# Patient Record
Sex: Male | Born: 1935 | Race: White | Hispanic: No | Smoking: Never smoker
Health system: Southern US, Community
[De-identification: ages and names within clinical notes are randomized; demographics above are authoritative.]

## PROBLEM LIST (undated history)

## (undated) DIAGNOSIS — G5600 Carpal tunnel syndrome, unspecified upper limb: Secondary | ICD-10-CM

## (undated) DIAGNOSIS — I493 Ventricular premature depolarization: Secondary | ICD-10-CM

## (undated) DIAGNOSIS — G609 Hereditary and idiopathic neuropathy, unspecified: Secondary | ICD-10-CM

## (undated) DIAGNOSIS — C61 Malignant neoplasm of prostate: Secondary | ICD-10-CM

## (undated) DIAGNOSIS — D369 Benign neoplasm, unspecified site: Secondary | ICD-10-CM

## (undated) DIAGNOSIS — Z0389 Encounter for observation for other suspected diseases and conditions ruled out: Secondary | ICD-10-CM

## (undated) DIAGNOSIS — I340 Nonrheumatic mitral (valve) insufficiency: Secondary | ICD-10-CM

## (undated) DIAGNOSIS — R269 Unspecified abnormalities of gait and mobility: Secondary | ICD-10-CM

## (undated) DIAGNOSIS — F329 Major depressive disorder, single episode, unspecified: Secondary | ICD-10-CM

## (undated) DIAGNOSIS — I440 Atrioventricular block, first degree: Secondary | ICD-10-CM

## (undated) DIAGNOSIS — E7800 Pure hypercholesterolemia, unspecified: Secondary | ICD-10-CM

## (undated) DIAGNOSIS — Z860100 Personal history of colon polyps, unspecified: Secondary | ICD-10-CM

## (undated) DIAGNOSIS — D696 Thrombocytopenia, unspecified: Secondary | ICD-10-CM

## (undated) DIAGNOSIS — G629 Polyneuropathy, unspecified: Secondary | ICD-10-CM

## (undated) DIAGNOSIS — I341 Nonrheumatic mitral (valve) prolapse: Secondary | ICD-10-CM

## (undated) DIAGNOSIS — F3289 Other specified depressive episodes: Secondary | ICD-10-CM

## (undated) DIAGNOSIS — M1990 Unspecified osteoarthritis, unspecified site: Secondary | ICD-10-CM

## (undated) DIAGNOSIS — J3489 Other specified disorders of nose and nasal sinuses: Secondary | ICD-10-CM

## (undated) DIAGNOSIS — H539 Unspecified visual disturbance: Secondary | ICD-10-CM

## (undated) DIAGNOSIS — I272 Pulmonary hypertension, unspecified: Secondary | ICD-10-CM

## (undated) DIAGNOSIS — IMO0001 Reserved for inherently not codable concepts without codable children: Secondary | ICD-10-CM

## (undated) DIAGNOSIS — I480 Paroxysmal atrial fibrillation: Secondary | ICD-10-CM

## (undated) DIAGNOSIS — C439 Malignant melanoma of skin, unspecified: Secondary | ICD-10-CM

## (undated) DIAGNOSIS — I10 Essential (primary) hypertension: Secondary | ICD-10-CM

## (undated) HISTORY — DX: Thrombocytopenia, unspecified: D69.6

## (undated) HISTORY — DX: Malignant neoplasm of prostate: C61

## (undated) HISTORY — DX: Unspecified abnormalities of gait and mobility: R26.9

## (undated) HISTORY — DX: Personal history of colonic polyps: Z86.010

## (undated) HISTORY — DX: Nonrheumatic mitral (valve) prolapse: I34.1

## (undated) HISTORY — DX: Other specified disorders of nose and nasal sinuses: J34.89

## (undated) HISTORY — DX: Atrioventricular block, first degree: I44.0

## (undated) HISTORY — PX: COLONOSCOPY W/ POLYPECTOMY: SHX1380

## (undated) HISTORY — DX: Major depressive disorder, single episode, unspecified: F32.9

## (undated) HISTORY — PX: US ECHOCARDIOGRAPHY: HXRAD669

## (undated) HISTORY — DX: Benign neoplasm, unspecified site: D36.9

## (undated) HISTORY — DX: Pure hypercholesterolemia, unspecified: E78.00

## (undated) HISTORY — DX: Carpal tunnel syndrome, unspecified upper limb: G56.00

## (undated) HISTORY — DX: Unspecified osteoarthritis, unspecified site: M19.90

## (undated) HISTORY — DX: Essential (primary) hypertension: I10

## (undated) HISTORY — DX: Other specified depressive episodes: F32.89

## (undated) HISTORY — PX: OTHER SURGICAL HISTORY: SHX169

## (undated) HISTORY — DX: Unspecified visual disturbance: H53.9

## (undated) HISTORY — PX: KNEE ARTHROSCOPY: SUR90

## (undated) HISTORY — DX: Hereditary and idiopathic neuropathy, unspecified: G60.9

## (undated) HISTORY — DX: Personal history of colon polyps, unspecified: Z86.0100

## (undated) HISTORY — DX: Nonrheumatic mitral (valve) insufficiency: I34.0

## (undated) HISTORY — DX: Malignant melanoma of skin, unspecified: C43.9

---

## 1991-11-18 HISTORY — PX: PROSTATECTOMY: SHX69

## 1999-12-27 ENCOUNTER — Encounter (INDEPENDENT_AMBULATORY_CARE_PROVIDER_SITE_OTHER): Payer: Self-pay | Admitting: Specialist

## 1999-12-27 ENCOUNTER — Ambulatory Visit (HOSPITAL_BASED_OUTPATIENT_CLINIC_OR_DEPARTMENT_OTHER): Admission: RE | Admit: 1999-12-27 | Discharge: 1999-12-27 | Payer: Self-pay | Admitting: General Surgery

## 2001-07-22 ENCOUNTER — Ambulatory Visit (HOSPITAL_COMMUNITY): Admission: RE | Admit: 2001-07-22 | Discharge: 2001-07-22 | Payer: Self-pay | Admitting: *Deleted

## 2001-11-17 HISTORY — PX: ROTATOR CUFF REPAIR: SHX139

## 2002-02-07 ENCOUNTER — Encounter: Payer: Self-pay | Admitting: Orthopedic Surgery

## 2002-02-07 ENCOUNTER — Encounter: Admission: RE | Admit: 2002-02-07 | Discharge: 2002-02-07 | Payer: Self-pay | Admitting: Orthopedic Surgery

## 2002-02-08 ENCOUNTER — Ambulatory Visit (HOSPITAL_BASED_OUTPATIENT_CLINIC_OR_DEPARTMENT_OTHER): Admission: RE | Admit: 2002-02-08 | Discharge: 2002-02-09 | Payer: Self-pay | Admitting: Orthopedic Surgery

## 2002-02-16 ENCOUNTER — Encounter: Admission: RE | Admit: 2002-02-16 | Discharge: 2002-05-02 | Payer: Self-pay | Admitting: Orthopedic Surgery

## 2003-10-17 ENCOUNTER — Encounter: Admission: RE | Admit: 2003-10-17 | Discharge: 2003-12-19 | Payer: Self-pay | Admitting: *Deleted

## 2003-11-13 ENCOUNTER — Ambulatory Visit (HOSPITAL_COMMUNITY): Admission: RE | Admit: 2003-11-13 | Discharge: 2003-11-13 | Payer: Self-pay | Admitting: Orthopedic Surgery

## 2003-12-01 ENCOUNTER — Ambulatory Visit (HOSPITAL_COMMUNITY): Admission: RE | Admit: 2003-12-01 | Discharge: 2003-12-01 | Payer: Self-pay | Admitting: Orthopedic Surgery

## 2004-01-10 ENCOUNTER — Encounter: Admission: RE | Admit: 2004-01-10 | Discharge: 2004-02-22 | Payer: Self-pay | Admitting: Orthopedic Surgery

## 2004-10-29 ENCOUNTER — Ambulatory Visit (HOSPITAL_BASED_OUTPATIENT_CLINIC_OR_DEPARTMENT_OTHER): Admission: RE | Admit: 2004-10-29 | Discharge: 2004-10-29 | Payer: Self-pay | Admitting: General Surgery

## 2004-10-29 ENCOUNTER — Ambulatory Visit (HOSPITAL_COMMUNITY): Admission: RE | Admit: 2004-10-29 | Discharge: 2004-10-29 | Payer: Self-pay | Admitting: General Surgery

## 2004-10-29 ENCOUNTER — Encounter (INDEPENDENT_AMBULATORY_CARE_PROVIDER_SITE_OTHER): Payer: Self-pay | Admitting: *Deleted

## 2004-11-06 ENCOUNTER — Ambulatory Visit (HOSPITAL_COMMUNITY): Admission: RE | Admit: 2004-11-06 | Discharge: 2004-11-06 | Payer: Self-pay | Admitting: General Surgery

## 2004-11-25 ENCOUNTER — Encounter: Admission: RE | Admit: 2004-11-25 | Discharge: 2004-11-25 | Payer: Self-pay | Admitting: General Surgery

## 2004-11-26 ENCOUNTER — Encounter (INDEPENDENT_AMBULATORY_CARE_PROVIDER_SITE_OTHER): Payer: Self-pay | Admitting: *Deleted

## 2004-11-26 ENCOUNTER — Ambulatory Visit (HOSPITAL_BASED_OUTPATIENT_CLINIC_OR_DEPARTMENT_OTHER): Admission: RE | Admit: 2004-11-26 | Discharge: 2004-11-26 | Payer: Self-pay | Admitting: General Surgery

## 2004-11-26 ENCOUNTER — Ambulatory Visit (HOSPITAL_COMMUNITY): Admission: RE | Admit: 2004-11-26 | Discharge: 2004-11-26 | Payer: Self-pay | Admitting: General Surgery

## 2004-11-30 ENCOUNTER — Emergency Department (HOSPITAL_COMMUNITY): Admission: EM | Admit: 2004-11-30 | Discharge: 2004-11-30 | Payer: Self-pay | Admitting: Emergency Medicine

## 2005-03-06 ENCOUNTER — Ambulatory Visit: Admission: RE | Admit: 2005-03-06 | Discharge: 2005-03-06 | Payer: Self-pay | Admitting: Orthopedic Surgery

## 2005-03-11 ENCOUNTER — Encounter: Admission: RE | Admit: 2005-03-11 | Discharge: 2005-05-27 | Payer: Self-pay | Admitting: Orthopedic Surgery

## 2005-05-26 ENCOUNTER — Encounter: Admission: RE | Admit: 2005-05-26 | Discharge: 2005-05-26 | Payer: Self-pay | Admitting: Dermatology

## 2005-06-17 ENCOUNTER — Encounter: Admission: RE | Admit: 2005-06-17 | Discharge: 2005-08-12 | Payer: Self-pay | Admitting: Orthopaedic Surgery

## 2005-06-18 ENCOUNTER — Ambulatory Visit (HOSPITAL_COMMUNITY): Admission: RE | Admit: 2005-06-18 | Discharge: 2005-06-18 | Payer: Self-pay | Admitting: Orthopedic Surgery

## 2005-06-27 ENCOUNTER — Encounter: Admission: RE | Admit: 2005-06-27 | Discharge: 2005-06-27 | Payer: Self-pay | Admitting: General Surgery

## 2005-07-02 ENCOUNTER — Ambulatory Visit (HOSPITAL_COMMUNITY): Admission: RE | Admit: 2005-07-02 | Discharge: 2005-07-02 | Payer: Self-pay | Admitting: General Surgery

## 2005-07-04 ENCOUNTER — Encounter: Admission: RE | Admit: 2005-07-04 | Discharge: 2005-07-04 | Payer: Self-pay | Admitting: General Surgery

## 2005-07-07 ENCOUNTER — Ambulatory Visit: Payer: Self-pay | Admitting: Oncology

## 2005-07-31 ENCOUNTER — Ambulatory Visit (HOSPITAL_COMMUNITY): Admission: RE | Admit: 2005-07-31 | Discharge: 2005-07-31 | Payer: Self-pay | Admitting: Oncology

## 2005-07-31 ENCOUNTER — Encounter (INDEPENDENT_AMBULATORY_CARE_PROVIDER_SITE_OTHER): Payer: Self-pay | Admitting: *Deleted

## 2005-08-25 ENCOUNTER — Ambulatory Visit: Payer: Self-pay | Admitting: Oncology

## 2005-09-03 ENCOUNTER — Ambulatory Visit (HOSPITAL_COMMUNITY): Admission: RE | Admit: 2005-09-03 | Discharge: 2005-09-03 | Payer: Self-pay | Admitting: Oncology

## 2006-02-02 ENCOUNTER — Ambulatory Visit: Payer: Self-pay | Admitting: Oncology

## 2006-04-22 ENCOUNTER — Ambulatory Visit: Payer: Self-pay | Admitting: Gastroenterology

## 2006-04-23 ENCOUNTER — Ambulatory Visit (HOSPITAL_COMMUNITY): Admission: RE | Admit: 2006-04-23 | Discharge: 2006-04-23 | Payer: Self-pay | Admitting: Gastroenterology

## 2006-05-26 ENCOUNTER — Ambulatory Visit: Payer: Self-pay | Admitting: Gastroenterology

## 2006-05-27 ENCOUNTER — Ambulatory Visit: Payer: Self-pay | Admitting: Oncology

## 2006-06-02 ENCOUNTER — Ambulatory Visit (HOSPITAL_COMMUNITY): Admission: RE | Admit: 2006-06-02 | Discharge: 2006-06-02 | Payer: Self-pay | Admitting: Oncology

## 2006-06-02 LAB — CBC WITH DIFFERENTIAL/PLATELET
Basophils Absolute: 0 10*3/uL (ref 0.0–0.1)
Eosinophils Absolute: 0.1 10*3/uL (ref 0.0–0.5)
HCT: 44.3 % (ref 38.7–49.9)
HGB: 15.3 g/dL (ref 13.0–17.1)
MCH: 31.2 pg (ref 28.0–33.4)
MONO#: 0.4 10*3/uL (ref 0.1–0.9)
NEUT#: 4.9 10*3/uL (ref 1.5–6.5)
NEUT%: 69.7 % (ref 40.0–75.0)
RDW: 13 % (ref 11.2–14.6)
WBC: 7 10*3/uL (ref 4.0–10.0)
lymph#: 1.6 10*3/uL (ref 0.9–3.3)

## 2006-06-02 LAB — COMPREHENSIVE METABOLIC PANEL
AST: 17 U/L (ref 0–37)
Albumin: 4.1 g/dL (ref 3.5–5.2)
BUN: 23 mg/dL (ref 6–23)
CO2: 25 mEq/L (ref 19–32)
Calcium: 9.2 mg/dL (ref 8.4–10.5)
Chloride: 107 mEq/L (ref 96–112)
Creatinine, Ser: 1.03 mg/dL (ref 0.40–1.50)
Glucose, Bld: 116 mg/dL — ABNORMAL HIGH (ref 70–99)
Potassium: 4.5 mEq/L (ref 3.5–5.3)

## 2006-06-02 LAB — LACTATE DEHYDROGENASE: LDH: 153 U/L (ref 94–250)

## 2006-06-04 ENCOUNTER — Ambulatory Visit: Payer: Self-pay | Admitting: Gastroenterology

## 2006-06-16 LAB — HM COLONOSCOPY: HM Colonoscopy: NORMAL

## 2006-09-17 HISTORY — PX: OTHER SURGICAL HISTORY: SHX169

## 2006-09-25 ENCOUNTER — Ambulatory Visit: Payer: Self-pay | Admitting: Oncology

## 2006-09-29 LAB — CBC WITH DIFFERENTIAL/PLATELET
Basophils Absolute: 0 10*3/uL (ref 0.0–0.1)
Eosinophils Absolute: 0 10*3/uL (ref 0.0–0.5)
HGB: 15.3 g/dL (ref 13.0–17.1)
MCV: 90.3 fL (ref 81.6–98.0)
MONO#: 0.4 10*3/uL (ref 0.1–0.9)
MONO%: 5.5 % (ref 0.0–13.0)
NEUT#: 5.2 10*3/uL (ref 1.5–6.5)
RDW: 12.9 % (ref 11.2–14.6)
WBC: 7.3 10*3/uL (ref 4.0–10.0)
lymph#: 1.6 10*3/uL (ref 0.9–3.3)

## 2006-09-29 LAB — COMPREHENSIVE METABOLIC PANEL
Albumin: 4.1 g/dL (ref 3.5–5.2)
BUN: 24 mg/dL — ABNORMAL HIGH (ref 6–23)
CO2: 29 mEq/L (ref 19–32)
Calcium: 9.4 mg/dL (ref 8.4–10.5)
Chloride: 105 mEq/L (ref 96–112)
Glucose, Bld: 94 mg/dL (ref 70–99)
Potassium: 4.7 mEq/L (ref 3.5–5.3)
Sodium: 142 mEq/L (ref 135–145)
Total Protein: 6.4 g/dL (ref 6.0–8.3)

## 2006-10-01 ENCOUNTER — Encounter (INDEPENDENT_AMBULATORY_CARE_PROVIDER_SITE_OTHER): Payer: Self-pay | Admitting: Specialist

## 2006-10-01 ENCOUNTER — Ambulatory Visit (HOSPITAL_COMMUNITY): Admission: RE | Admit: 2006-10-01 | Discharge: 2006-10-01 | Payer: Self-pay | Admitting: Oncology

## 2007-02-02 ENCOUNTER — Encounter: Admission: RE | Admit: 2007-02-02 | Discharge: 2007-02-02 | Payer: Self-pay | Admitting: Orthopedic Surgery

## 2007-02-04 ENCOUNTER — Ambulatory Visit (HOSPITAL_BASED_OUTPATIENT_CLINIC_OR_DEPARTMENT_OTHER): Admission: RE | Admit: 2007-02-04 | Discharge: 2007-02-04 | Payer: Self-pay | Admitting: Orthopedic Surgery

## 2007-03-01 ENCOUNTER — Encounter: Admission: RE | Admit: 2007-03-01 | Discharge: 2007-05-26 | Payer: Self-pay | Admitting: Orthopedic Surgery

## 2007-03-31 ENCOUNTER — Ambulatory Visit: Payer: Self-pay | Admitting: Oncology

## 2007-04-02 LAB — COMPREHENSIVE METABOLIC PANEL
AST: 16 U/L (ref 0–37)
Albumin: 4 g/dL (ref 3.5–5.2)
Alkaline Phosphatase: 35 U/L — ABNORMAL LOW (ref 39–117)
Calcium: 8.9 mg/dL (ref 8.4–10.5)
Chloride: 106 mEq/L (ref 96–112)
Glucose, Bld: 138 mg/dL — ABNORMAL HIGH (ref 70–99)
Potassium: 4.3 mEq/L (ref 3.5–5.3)
Sodium: 140 mEq/L (ref 135–145)
Total Protein: 6.1 g/dL (ref 6.0–8.3)

## 2007-04-02 LAB — CBC WITH DIFFERENTIAL/PLATELET
Basophils Absolute: 0 10*3/uL (ref 0.0–0.1)
EOS%: 0.9 % (ref 0.0–7.0)
Eosinophils Absolute: 0.1 10*3/uL (ref 0.0–0.5)
HGB: 15 g/dL (ref 13.0–17.1)
MCV: 89.6 fL (ref 81.6–98.0)
MONO%: 5.3 % (ref 0.0–13.0)
NEUT#: 4.4 10*3/uL (ref 1.5–6.5)
RBC: 4.75 10*6/uL (ref 4.20–5.71)
RDW: 12.5 % (ref 11.2–14.6)
lymph#: 1.4 10*3/uL (ref 0.9–3.3)

## 2007-09-29 ENCOUNTER — Ambulatory Visit: Payer: Self-pay | Admitting: Oncology

## 2007-10-05 LAB — CBC WITH DIFFERENTIAL/PLATELET
BASO%: 0.1 % (ref 0.0–2.0)
Basophils Absolute: 0 10*3/uL (ref 0.0–0.1)
EOS%: 0.8 % (ref 0.0–7.0)
HCT: 43.9 % (ref 38.7–49.9)
HGB: 15.4 g/dL (ref 13.0–17.1)
MCH: 31.6 pg (ref 28.0–33.4)
MCHC: 35 g/dL (ref 32.0–35.9)
MCV: 90.1 fL (ref 81.6–98.0)
MONO%: 6.5 % (ref 0.0–13.0)
NEUT%: 63 % (ref 40.0–75.0)
lymph#: 2.2 10*3/uL (ref 0.9–3.3)

## 2007-10-05 LAB — COMPREHENSIVE METABOLIC PANEL
ALT: 15 U/L (ref 0–53)
AST: 16 U/L (ref 0–37)
Alkaline Phosphatase: 36 U/L — ABNORMAL LOW (ref 39–117)
BUN: 26 mg/dL — ABNORMAL HIGH (ref 6–23)
Creatinine, Ser: 1 mg/dL (ref 0.40–1.50)
Total Bilirubin: 0.7 mg/dL (ref 0.3–1.2)

## 2008-04-03 ENCOUNTER — Ambulatory Visit (HOSPITAL_COMMUNITY): Admission: RE | Admit: 2008-04-03 | Discharge: 2008-04-03 | Payer: Self-pay | Admitting: Oncology

## 2008-04-03 ENCOUNTER — Ambulatory Visit: Payer: Self-pay | Admitting: Oncology

## 2008-06-09 ENCOUNTER — Ambulatory Visit: Payer: Self-pay | Admitting: *Deleted

## 2008-06-09 DIAGNOSIS — F3289 Other specified depressive episodes: Secondary | ICD-10-CM | POA: Insufficient documentation

## 2008-06-09 DIAGNOSIS — M5380 Other specified dorsopathies, site unspecified: Secondary | ICD-10-CM | POA: Insufficient documentation

## 2008-06-09 DIAGNOSIS — M159 Polyosteoarthritis, unspecified: Secondary | ICD-10-CM

## 2008-06-09 DIAGNOSIS — K219 Gastro-esophageal reflux disease without esophagitis: Secondary | ICD-10-CM

## 2008-06-09 DIAGNOSIS — E7800 Pure hypercholesterolemia, unspecified: Secondary | ICD-10-CM

## 2008-06-09 DIAGNOSIS — Z8679 Personal history of other diseases of the circulatory system: Secondary | ICD-10-CM | POA: Insufficient documentation

## 2008-06-09 DIAGNOSIS — F329 Major depressive disorder, single episode, unspecified: Secondary | ICD-10-CM

## 2008-06-09 DIAGNOSIS — Z860100 Personal history of colon polyps, unspecified: Secondary | ICD-10-CM | POA: Insufficient documentation

## 2008-06-09 DIAGNOSIS — I10 Essential (primary) hypertension: Secondary | ICD-10-CM

## 2008-06-09 DIAGNOSIS — Z85820 Personal history of malignant melanoma of skin: Secondary | ICD-10-CM

## 2008-06-09 DIAGNOSIS — I059 Rheumatic mitral valve disease, unspecified: Secondary | ICD-10-CM | POA: Insufficient documentation

## 2008-06-09 DIAGNOSIS — R011 Cardiac murmur, unspecified: Secondary | ICD-10-CM | POA: Insufficient documentation

## 2008-06-09 DIAGNOSIS — Z8546 Personal history of malignant neoplasm of prostate: Secondary | ICD-10-CM

## 2008-08-15 ENCOUNTER — Ambulatory Visit: Payer: Self-pay | Admitting: *Deleted

## 2008-10-04 ENCOUNTER — Ambulatory Visit: Payer: Self-pay | Admitting: Oncology

## 2008-10-06 LAB — CBC WITH DIFFERENTIAL/PLATELET
BASO%: 0.5 % (ref 0.0–2.0)
EOS%: 0.7 % (ref 0.0–7.0)
LYMPH%: 26.2 % (ref 14.0–48.0)
MCH: 31.3 pg (ref 28.0–33.4)
MCHC: 34.1 g/dL (ref 32.0–35.9)
MONO#: 0.4 10*3/uL (ref 0.1–0.9)
Platelets: 108 10*3/uL — ABNORMAL LOW (ref 145–400)
RBC: 4.87 10*6/uL (ref 4.20–5.71)
WBC: 6.1 10*3/uL (ref 4.0–10.0)
lymph#: 1.6 10*3/uL (ref 0.9–3.3)

## 2008-10-06 LAB — COMPREHENSIVE METABOLIC PANEL
AST: 17 U/L (ref 0–37)
BUN: 27 mg/dL — ABNORMAL HIGH (ref 6–23)
Calcium: 9.4 mg/dL (ref 8.4–10.5)
Chloride: 105 mEq/L (ref 96–112)
Creatinine, Ser: 1.02 mg/dL (ref 0.40–1.50)
Glucose, Bld: 96 mg/dL (ref 70–99)

## 2009-03-29 ENCOUNTER — Ambulatory Visit (HOSPITAL_COMMUNITY): Admission: RE | Admit: 2009-03-29 | Discharge: 2009-03-29 | Payer: Self-pay | Admitting: Oncology

## 2009-04-02 ENCOUNTER — Ambulatory Visit: Payer: Self-pay | Admitting: Oncology

## 2009-04-04 LAB — COMPREHENSIVE METABOLIC PANEL
ALT: 19 U/L (ref 0–53)
Albumin: 4 g/dL (ref 3.5–5.2)
CO2: 25 mEq/L (ref 19–32)
Glucose, Bld: 98 mg/dL (ref 70–99)
Potassium: 4.7 mEq/L (ref 3.5–5.3)
Sodium: 139 mEq/L (ref 135–145)
Total Protein: 6.2 g/dL (ref 6.0–8.3)

## 2009-04-04 LAB — CBC WITH DIFFERENTIAL/PLATELET
Eosinophils Absolute: 0 10*3/uL (ref 0.0–0.5)
MONO#: 0.5 10*3/uL (ref 0.1–0.9)
NEUT#: 4.1 10*3/uL (ref 1.5–6.5)
RBC: 4.66 10*6/uL (ref 4.20–5.82)
RDW: 12.5 % (ref 11.0–14.6)
WBC: 6.2 10*3/uL (ref 4.0–10.3)
lymph#: 1.5 10*3/uL (ref 0.9–3.3)

## 2009-04-04 LAB — LACTATE DEHYDROGENASE: LDH: 149 U/L (ref 94–250)

## 2009-04-10 ENCOUNTER — Ambulatory Visit: Payer: Self-pay | Admitting: Family Medicine

## 2009-04-10 DIAGNOSIS — L309 Dermatitis, unspecified: Secondary | ICD-10-CM | POA: Insufficient documentation

## 2009-04-10 DIAGNOSIS — L2089 Other atopic dermatitis: Secondary | ICD-10-CM

## 2009-04-12 ENCOUNTER — Ambulatory Visit (HOSPITAL_BASED_OUTPATIENT_CLINIC_OR_DEPARTMENT_OTHER): Admission: RE | Admit: 2009-04-12 | Discharge: 2009-04-12 | Payer: Self-pay | Admitting: Orthopedic Surgery

## 2009-04-24 ENCOUNTER — Encounter: Admission: RE | Admit: 2009-04-24 | Discharge: 2009-05-14 | Payer: Self-pay | Admitting: Orthopedic Surgery

## 2009-05-28 ENCOUNTER — Ambulatory Visit: Payer: Self-pay | Admitting: Family Medicine

## 2009-05-28 DIAGNOSIS — B353 Tinea pedis: Secondary | ICD-10-CM

## 2009-09-17 HISTORY — PX: INGUINAL HERNIA REPAIR: SHX194

## 2009-09-18 ENCOUNTER — Ambulatory Visit (HOSPITAL_BASED_OUTPATIENT_CLINIC_OR_DEPARTMENT_OTHER): Admission: RE | Admit: 2009-09-18 | Discharge: 2009-09-18 | Payer: Self-pay | Admitting: Surgery

## 2009-10-02 ENCOUNTER — Ambulatory Visit: Payer: Self-pay | Admitting: Oncology

## 2009-10-04 LAB — COMPREHENSIVE METABOLIC PANEL
Albumin: 4.2 g/dL (ref 3.5–5.2)
Alkaline Phosphatase: 34 U/L — ABNORMAL LOW (ref 39–117)
BUN: 24 mg/dL — ABNORMAL HIGH (ref 6–23)
CO2: 29 mEq/L (ref 19–32)
Calcium: 9.5 mg/dL (ref 8.4–10.5)
Glucose, Bld: 72 mg/dL (ref 70–99)
Potassium: 4.4 mEq/L (ref 3.5–5.3)

## 2009-10-04 LAB — CBC WITH DIFFERENTIAL/PLATELET
BASO%: 0.8 % (ref 0.0–2.0)
Eosinophils Absolute: 0 10*3/uL (ref 0.0–0.5)
LYMPH%: 25.1 % (ref 14.0–49.0)
MCHC: 34.8 g/dL (ref 32.0–36.0)
MONO#: 0.5 10*3/uL (ref 0.1–0.9)
NEUT#: 3.8 10*3/uL (ref 1.5–6.5)
Platelets: 110 10*3/uL — ABNORMAL LOW (ref 140–400)
RBC: 4.49 10*6/uL (ref 4.20–5.82)
WBC: 5.8 10*3/uL (ref 4.0–10.3)
lymph#: 1.5 10*3/uL (ref 0.9–3.3)

## 2009-10-04 LAB — LACTATE DEHYDROGENASE: LDH: 152 U/L (ref 94–250)

## 2010-09-17 ENCOUNTER — Ambulatory Visit: Payer: Self-pay | Admitting: Internal Medicine

## 2010-09-17 ENCOUNTER — Ambulatory Visit (HOSPITAL_BASED_OUTPATIENT_CLINIC_OR_DEPARTMENT_OTHER): Admission: RE | Admit: 2010-09-17 | Discharge: 2010-09-17 | Payer: Self-pay | Admitting: Internal Medicine

## 2010-09-17 ENCOUNTER — Ambulatory Visit: Payer: Self-pay | Admitting: Interventional Radiology

## 2010-09-17 DIAGNOSIS — L84 Corns and callosities: Secondary | ICD-10-CM

## 2010-09-17 DIAGNOSIS — C61 Malignant neoplasm of prostate: Secondary | ICD-10-CM

## 2010-09-17 DIAGNOSIS — M549 Dorsalgia, unspecified: Secondary | ICD-10-CM

## 2010-09-17 LAB — CONVERTED CEMR LAB
BUN: 27 mg/dL — ABNORMAL HIGH (ref 6–23)
Calcium: 9.8 mg/dL (ref 8.4–10.5)
Glucose, Bld: 93 mg/dL (ref 70–99)
Hemoglobin: 15.3 g/dL (ref 13.0–17.0)
MCHC: 34.2 g/dL (ref 30.0–36.0)
MCV: 91.2 fL (ref 78.0–100.0)
PSA: 0.07 ng/mL — ABNORMAL LOW (ref 0.10–4.00)
RBC: 4.91 M/uL (ref 4.22–5.81)

## 2010-09-19 ENCOUNTER — Telehealth: Payer: Self-pay | Admitting: Internal Medicine

## 2010-09-19 DIAGNOSIS — D696 Thrombocytopenia, unspecified: Secondary | ICD-10-CM | POA: Insufficient documentation

## 2010-09-26 ENCOUNTER — Ambulatory Visit: Payer: Self-pay | Admitting: Oncology

## 2010-09-30 ENCOUNTER — Ambulatory Visit (HOSPITAL_COMMUNITY): Admission: RE | Admit: 2010-09-30 | Discharge: 2010-09-30 | Payer: Self-pay | Admitting: Oncology

## 2010-09-30 LAB — COMPREHENSIVE METABOLIC PANEL
AST: 18 U/L (ref 0–37)
Albumin: 4.1 g/dL (ref 3.5–5.2)
Alkaline Phosphatase: 33 U/L — ABNORMAL LOW (ref 39–117)
BUN: 22 mg/dL (ref 6–23)
Potassium: 4.3 mEq/L (ref 3.5–5.3)
Sodium: 140 mEq/L (ref 135–145)

## 2010-09-30 LAB — CBC WITH DIFFERENTIAL/PLATELET
BASO%: 0.6 % (ref 0.0–2.0)
EOS%: 0.7 % (ref 0.0–7.0)
MCH: 32.1 pg (ref 27.2–33.4)
MCHC: 35.1 g/dL (ref 32.0–36.0)
RBC: 4.69 10*6/uL (ref 4.20–5.82)
RDW: 12.7 % (ref 11.0–14.6)
lymph#: 1.7 10*3/uL (ref 0.9–3.3)

## 2010-10-01 ENCOUNTER — Ambulatory Visit: Payer: Self-pay | Admitting: Cardiology

## 2010-10-08 ENCOUNTER — Encounter: Payer: Self-pay | Admitting: Internal Medicine

## 2010-11-19 ENCOUNTER — Ambulatory Visit: Admit: 2010-11-19 | Payer: Self-pay | Admitting: Internal Medicine

## 2010-12-19 NOTE — Progress Notes (Signed)
Summary: Lab Results  Phone Note Outgoing Call   Summary of Call: call pt - platelet count is slightly low.  this may be caused by medication side effect.   I recommend we repeat CBC in 6 months Initial call taken by: D. Thomos Lemons DO,  September 19, 2010 1:16 PM  Follow-up for Phone Call        call placed to patient at [phone], no answer. A voice message was left for patient to return call Follow-up by: Glendell Docker CMA,  September 19, 2010 3:20 PM  Additional Follow-up for Phone Call Additional follow up Details #1::        patient returned call , he has been advised per Dr Artist Pais instructions Additional Follow-up by: Glendell Docker CMA,  September 20, 2010 10:11 AM  New Problems: THROMBOCYTOPENIA (ICD-287.5)   New Problems: THROMBOCYTOPENIA (ICD-287.5)

## 2010-12-19 NOTE — Assessment & Plan Note (Signed)
Summary: pain in lower back/mhf   Vital Signs:  Patient profile:   77 year old male Height:      77 inches Weight:      204 pounds BMI:     24.28 O2 Sat:      97 % on Room air Temp:     97.5 degrees F oral Pulse rate:   61 / minute Pulse rhythm:   regular Resp:     18 per minute BP sitting:   122 / 80  (right arm) Cuff size:   large  Vitals Entered By: Glendell Docker CMA (September 17, 2010 3:22 PM)  O2 Flow:  Room air CC: Back Pain Is Patient Diabetic? No Pain Assessment Patient in pain? yes     Location: lower back Intensity: 6 Type: sharp Onset of pain  Intermittent Comments c/o sudden onset  lower back pain for the past week, relief with heating pad,  evaluation of callus on toe of left foot,  denies any urinary symptoms, request flu shot   Primary Care Provider:  Seymour Bars DO  CC:  Back Pain.  History of Present Illness: 77 y/o white male c/o low back pain onset 1 week chronic low back pain lower lumbar  sharp intermittent pain with movement severity 6 out of 10 no lower ext weakness no radicular symptoms no abd or GU symptoms  hx of prostate - Dr. Serena Colonel last PSA - low  hx of  melanoma x 3    Allergies (verified): No Known Drug Allergies  Past History:  Past Medical History: Current Problems:  HYPERTENSION, BENIGN ESSENTIAL (ICD-401.1) HYPERCHOLESTEROLEMIA (ICD-272.0) CARDIAC MURMUR (ICD-785.2) GERD (ICD-530.81)  DEPRESSION (ICD-311) OSTEOARTHRITIS, GENERALIZED, MULTIPLE JOINTS (ICD-715.09) MITRAL VALVE PROLAPSE (ICD-424.0) ARRHYTHMIA, HX OF (ICD-V12.50) PERSONAL HISTORY OF COLONIC POLYPS (ICD-V12.72) PERSONAL HISTORY OF MALIGNANT MELANOMA OF SKIN    PERSONAL HISTORY MALIGNANT NEOPLASM PROSTATE (ICD-V10.46)  Family History: Family History of Arthritis - mother Family History Hypertension - mother and father Family History Psychiatric care - father Family History of Stroke - mother   Social History: Retired -  Airline pilot Widower 2 children    Review of Systems  The patient denies fever and abdominal pain.    Physical Exam  General:  alert, well-developed, and well-nourished.   Lungs:  normal respiratory effort and normal breath sounds.   Heart:  normal rate, regular rhythm, and no gallop.   Msk:  2nd digit of left foot  - hammer toe deformity hard callus tip of toe,  no redness or tenderness  limited lumbar flexion.  some pain with lumbar extension and side bending Extremities:  trace left pedal edema and trace right pedal edema.   Neurologic:  cranial nerves II-XII intact, strength normal in all extremities, gait normal, and DTRs symmetrical and normal.   Psych:  normally interactive and good eye contact.     Impression & Recommendations:  Problem # 1:  BACK PAIN, CHRONIC (ICD-724.5) I suspect pain from chronic OA of lumbar spine.  he has hx of prostate ca.  check x ray of lumbar spine and psa trial of muscle relaxers reviewed stretching exercises for hamstrings Patient advised to call office if symptoms persist or worsen.   The following medications were removed from the medication list:    Voltaren 75 Mg Tbec (Diclofenac sodium) .Marland Kitchen... 1 by mouth two times a day His updated medication list for this problem includes:    Bayer Aspirin Ec Low Dose 81 Mg Tbec (Aspirin) .Marland Kitchen... Take 1 tablet by  mouth once a day    Tylenol Extra Strength 500 Mg Tabs (Acetaminophen) .Marland Kitchen... As needed    Celebrex 200 Mg Caps (Celecoxib) .Marland Kitchen... Take 1 capsule by mouth once daily    Cyclobenzaprine Hcl 5 Mg Tabs (Cyclobenzaprine hcl) ..... One by mouth three times a day as needed  Orders: T-Lumbar Spine 2 Views (72100TC)  Problem # 2:  CALLUS, LEFT FOOT (ICD-700) callus on tip of left second digit.  he has chronic hammar toe deformity. arrange podiatry consult  use extra padding.   he may benefit from shoe inserts  Orders: Podiatry Referral (Podiatry)  Problem # 3:  ADENOCARCINOMA, PROSTATE  (ICD-185) monitor PSA .  forward copy to his urologist  Orders: T-PSA ([id number])  Complete Medication List: 1)  Toprol Xl 100 Mg Xr24h-tab (Metoprolol succinate) .... Take 1 tablet by mouth once a day 2)  Lipitor 10 Mg Tabs (Atorvastatin calcium) .... Take 1 tablet by mouth once a day 3)  Prilosec 20 Mg Cpdr (Omeprazole) .... As needed 4)  Trazodone Hcl 300 Mg Tabs (Trazodone hcl) .... Take 1 tablet by mouth once a day 5)  Bayer Aspirin Ec Low Dose 81 Mg Tbec (Aspirin) .... Take 1 tablet by mouth once a day 6)  Tylenol Extra Strength 500 Mg Tabs (Acetaminophen) .... As needed 7)  Celebrex 200 Mg Caps (Celecoxib) .... Take 1 capsule by mouth once daily 8)  Cyclobenzaprine Hcl 5 Mg Tabs (Cyclobenzaprine hcl) .... One by mouth three times a day as needed  Other Orders: T-Basic Metabolic Panel [id number]) T-CBC No Diff ([id number])  Patient Instructions: 1)  Please schedule a follow-up appointment in 2 months. Prescriptions: CYCLOBENZAPRINE HCL 5 MG TABS (CYCLOBENZAPRINE HCL) one by mouth three times a day as needed  #30 x 0   Entered and Authorized by:   D. Thomos Lemons DO   Signed by:   D. Thomos Lemons DO on 09/17/2010   Method used:   Print then Give to Patient   RxID:   [id number]    Orders Added: 1)  T-Lumbar Spine 2 Views [72100TC] 2)  T-PSA [[id number]] 3)  T-Basic Metabolic Panel [[id number]] 4)  T-CBC No Diff [[id number]] 5)  Podiatry Referral [Podiatry] 6)  Est. Patient Level IV [59563]    Current Allergies (reviewed today): No known allergies

## 2010-12-19 NOTE — Consult Note (Signed)
Summary: [facility]  [facility]   Imported By: Sherian Rein 10/28/2010 15:15:02  _____________________________________________________________________  External Attachment:    Type:   Image     Comment:   External Document

## 2011-02-20 LAB — BASIC METABOLIC PANEL
CO2: 29 mEq/L (ref 19–32)
Calcium: 8.8 mg/dL (ref 8.4–10.5)
Glucose, Bld: 136 mg/dL — ABNORMAL HIGH (ref 70–99)
Sodium: 136 mEq/L (ref 135–145)

## 2011-02-25 LAB — BASIC METABOLIC PANEL
BUN: 24 mg/dL — ABNORMAL HIGH (ref 6–23)
CO2: 28 mEq/L (ref 19–32)
Chloride: 103 mEq/L (ref 96–112)
GFR calc non Af Amer: >60 mL/min (ref >60)
Glucose, Bld: 85 mg/dL (ref 70–99)
Potassium: 5.2 mEq/L — ABNORMAL HIGH (ref 3.5–5.1)

## 2011-03-17 ENCOUNTER — Other Ambulatory Visit: Payer: Self-pay | Admitting: *Deleted

## 2011-03-17 DIAGNOSIS — E7800 Pure hypercholesterolemia, unspecified: Secondary | ICD-10-CM

## 2011-03-17 DIAGNOSIS — Z79899 Other long term (current) drug therapy: Secondary | ICD-10-CM

## 2011-03-31 ENCOUNTER — Encounter: Payer: Self-pay | Admitting: *Deleted

## 2011-04-01 ENCOUNTER — Encounter: Payer: Self-pay | Admitting: Cardiology

## 2011-04-01 ENCOUNTER — Encounter: Payer: Self-pay | Admitting: Internal Medicine

## 2011-04-01 ENCOUNTER — Other Ambulatory Visit (INDEPENDENT_AMBULATORY_CARE_PROVIDER_SITE_OTHER): Payer: 59 | Admitting: *Deleted

## 2011-04-01 ENCOUNTER — Ambulatory Visit (INDEPENDENT_AMBULATORY_CARE_PROVIDER_SITE_OTHER): Payer: 59 | Admitting: Cardiology

## 2011-04-01 DIAGNOSIS — Z79899 Other long term (current) drug therapy: Secondary | ICD-10-CM

## 2011-04-01 DIAGNOSIS — I059 Rheumatic mitral valve disease, unspecified: Secondary | ICD-10-CM

## 2011-04-01 DIAGNOSIS — E7800 Pure hypercholesterolemia, unspecified: Secondary | ICD-10-CM

## 2011-04-01 LAB — BASIC METABOLIC PANEL
BUN: 23 mg/dL (ref 6–23)
CO2: 30 mEq/L (ref 19–32)
Calcium: 9.3 mg/dL (ref 8.4–10.5)
Creatinine, Ser: 0.9 mg/dL (ref 0.4–1.5)
Glucose, Bld: 96 mg/dL (ref 70–99)

## 2011-04-01 LAB — HEPATIC FUNCTION PANEL
AST: 20 U/L (ref 0–37)
Alkaline Phosphatase: 29 U/L — ABNORMAL LOW (ref 39–117)
Total Bilirubin: 1.3 mg/dL — ABNORMAL HIGH (ref 0.3–1.2)

## 2011-04-01 LAB — LIPID PANEL: Total CHOL/HDL Ratio: 4

## 2011-04-01 NOTE — Assessment & Plan Note (Signed)
The patient is seen for a followup office visit.  He has a history of hypercholesterolemia and is on low dose Lipitor 10 mg daily.  He has not been experiencing any myalgias from the Lipitor.  Blood work today is pending.

## 2011-04-01 NOTE — Assessment & Plan Note (Signed)
Patient has a history of mitral valve prolapse.  He has a history of PVCs.  He does not have any history of coronary disease.  He had a normal nuclear stress test 09/17/06.  His last echocardiogram was 10/02/09 and showed ejection fraction 55-60% with diastolic dysfunction and mild biatrial enlargement and mitral valve prolapse with mild mitral regurgitation as well as mild aortic insufficiency.  There was mild pulmonary hypertension at 42 mm mercury.  The patient has not been aware of any recent palpitations.  He's not expressing any chest pain.  His not having any dizziness or syncope

## 2011-04-01 NOTE — Progress Notes (Signed)
[name] [name] Date of Birth:  1930-11-29 [facility] [address]   [address] [place], [place]  [address] [phone]           Fax   [phone]  HPI: This 77 year old gentleman is seen for a scheduled six-month followup office visit.  He has a past history of known mitral valve prolapse.  He has not been expressing any recent symptoms of any chest pain or increased shortness of breath.  His ability to exercise is limited because of recurring problems with his knees.  He has had 2 previous arthroscopic surgery procedures.  Dr. Cleophas Dunker is his orthopedist.  The patient has a past history of prostate cancer and is followed by Dr. Aldean Ast.  Patient had a past history of malignant melanoma of the left shoulder diagnosed in December 2005 with no recurrence.  He does not have coronary disease clinically.  He had a normal nuclear stress test 09/17/06.  His last echocardiogram on 10/02/09 and showed normal systolic function with an ejection fraction of 55-60% and demonstrated mitral valve prolapse with mild mitral regurgitation, diastolic dysfunction, biatrial enlargement, mild aortic insufficiency, and mild pulmonary hypertension.  Current Outpatient Prescriptions  Medication Sig Dispense Refill  . acetaminophen (TYLENOL) 500 MG tablet Take 500 mg by mouth every 6 (six) hours as needed.        Marland Kitchen aspirin 81 MG tablet Take 81 mg by mouth daily.        Marland Kitchen atorvastatin (LIPITOR) 10 MG tablet Take 10 mg by mouth daily.        . celecoxib (CELEBREX) 200 MG capsule Take 200 mg by mouth daily.        . metoprolol (TOPROL-XL) 100 MG 24 hr tablet Take 100 mg by mouth daily.        . TRAZODONE HCL PO Take 300 mg by mouth at bedtime.        Marland Kitchen DISCONTD: Ibuprofen (ADVIL PO) Take by mouth as needed.        Marland Kitchen DISCONTD: lansoprazole (PREVACID) 30 MG capsule Take 30 mg by mouth as needed.          No Known Allergies  Patient Active Problem List  Diagnoses  . TINEA PEDIS  .  ADENOCARCINOMA, PROSTATE  . HYPERCHOLESTEROLEMIA  . THROMBOCYTOPENIA  . DEPRESSION  . HYPERTENSION, BENIGN ESSENTIAL  . MITRAL VALVE PROLAPSE  . GERD  . DERMATITIS, ATOPIC  . CALLUS, LEFT FOOT  . OSTEOARTHRITIS, GENERALIZED, MULTIPLE JOINTS  . BACK PAIN, CHRONIC  . MUSCLE SPASM, BACK  . CARDIAC MURMUR  . PERSONAL HISTORY MALIGNANT NEOPLASM PROSTATE  . PERSONAL HISTORY OF MALIGNANT MELANOMA OF SKIN  . ARRHYTHMIA, HX OF  . PERSONAL HISTORY OF COLONIC POLYPS    History  Smoking status  . Never Smoker   Smokeless tobacco  . Not on file    History  Alcohol Use No    Family History  Problem Relation Age of Onset  . Clotting disorder Brother     CVA's    Review of Systems: The patient denies any heat or cold intolerance.  No weight gain or weight loss.  The patient denies headaches or blurry vision.  There is no cough or sputum production.  The patient denies dizziness.  There is no hematuria or hematochezia.  The patient denies any muscle aches or arthritis.  The patient denies any rash.  The patient denies frequent falling or instability.  There is no history of depression or anxiety.He has  had some difficulty with decreased hearing.  All other systems were reviewed and are negative.   Physical Exam: Filed Vitals:   04/01/11 1118  BP: 120/70  Pulse: 60  The general appearance reveals aTall gentleman in no acute distress.Pupils equal and reactive.   Extraocular Movements are full.  There is no scleral icterus.  The mouth and pharynx are normal.  The neck is supple.  The carotids reveal no bruits.  The jugular venous pressure is normal.  The thyroid is not enlarged.  There is no lymphadenopathy.Both ears are clogged with cerumen.The chest is clear to percussion and auscultation. There are no rales or rhonchi. Expansion of the chest is symmetrical.  The heart reveals a grade 1/6 apical systolic murmur of mitral regurgitation which increases to a grade 2/6 murmur when he stands  up.  No diastolic murmur no gallop or rub.The abdomen is soft and nontender. Bowel sounds are normal. The liver and spleen are not enlarged. There Are no abdominal masses. There are no bruits.  Normal extremity without phlebitis or edema.The skin is warm and dry.  There is no rash.Strength is normal and symmetrical in all extremities.  There is no lateralizing weakness.  There are no sensory deficits.    Assessment / Plan: He will see his primary care physician about his decreased hearing and the cerumen in both ear canals.  Recheck here in 6 months for followup office visit EKG and fasting lab work.

## 2011-04-01 NOTE — Op Note (Signed)
[name] [name], [name] [name]               ACCOUNT NO.:  [id number]   MEDICAL RECORD NO.:  [id number]          PATIENT TYPE:  AMB   LOCATION:  DSC                          FACILITY:  [facility]   PHYSICIAN:  Rodney A. Mortenson, M.D.DATE OF BIRTH:  [birth year]   DATE OF PROCEDURE:  04/12/2009  DATE OF DISCHARGE:                               OPERATIVE REPORT   JUSTIFICATION:  A 77 year old male 2 years ago had a small tear on  lateral meniscus of left knee.  He had done very well and this was  debrided.  He was doing well until 4-5 weeks ago when he started  developing sudden onset of pain in the left knee.  His mechanical  symptoms on the left, a small effusion.  Tenderness along the lateral  joint line.  Because of persistent pain and discomfort arthroscopic  evaluation indicated necessary.  With his previous surgery it was felt  that a repeat MRI would not be helpful and would have artifact instead.  The patient agrees.  He wished to proceed in the hopes that we can find  the treatable lesion.  Complication discussed.  Questions answered and  encouraged.   JUSTIFICATION FOR OUTPATIENT SURGERY:  Minimum morbidity.   PREOPERATIVE DIAGNOSIS:  Previous tear lateral meniscus left knee.   POSTOPERATIVE DIAGNOSIS:  New repeat tear, lateral meniscus left knee  with osteoarthritis lateral tibial plateau left knee.   OPERATION:  Subtotal lateral meniscectomy left knee.   SURGEON:  Lenard Galloway. Chaney Malling, MD   ANESTHESIA:  General.   PATHOLOGY:  With the arthroscope in the knee, a very careful examination  was undertaken.  The patellofemoral joint appeared fairly normal.  In  the femoral notch there was some fraying of articular cartilage.  The  anterior cruciate ligament was normal.  In the medial compartment ,  there was normal articular cartilage over the medial femoral condyle,  medial tibial plateau and the entire circumference of the medial  meniscus was visualized very carefully.  No tears  were seen.  In the  lateral compartment, there was fairly normal articular cartilage of the  lateral femoral condyle where there was a large area about the lateral  tibial plateau where there was total loss of articular cartilage and raw  bone exposure.  The patient had a new extensive complex tear of the  lateral meniscus.   PROCEDURE:  The patient was placed on the operating table in a supine  position with pneumatic tourniquet applied in the left upper thigh.  The  entire left lower extremity was prepped with Betadine and draped down in  the usual manner.  An infusion cannula was placed in superior medial  pouch and anteromedial and anterolateral portals were made.  The  arthroscope was introduced.  There was some problem with bleeding in the  knee and it was felt that wrapping the knee out with an Esmarch was  indicated.  Leg was wrapped out and tourniquet elevated.  The  arthroscope was then reentered and good visualization was achieved.  Through both medial and lateral portals, a series of baskets were  inserted and  a new tear which was quite extensive was debrided back.  This ended up in a subtotal lateral meniscectomy.  After this was  completed in my satisfaction the intra-articular shaver was introduced  and the debris was removed.  The remaining rim was then smoothed and  balanced.  The posterior two thirds of the meniscus on lateral side had  been excised.  Knee was then filled with Marcaine.  A large bulky  pressure dressing was applied and the patient returned to recovery room  in excellent condition.  Technically, this went extremely well.   DISPOSITION:  1. Percocet for pain.  2. Usual postoperative instructions.  3. To my office on Wednesday.  4. This patient is gradually going to deteriorate and may require      total knee at sometime in the future.      Rodney A. Chaney Malling, M.D.  Electronically Signed     RAM/MEDQ  D:  04/12/2009  T:  04/13/2009  Job:   [id number]

## 2011-04-02 ENCOUNTER — Ambulatory Visit: Payer: 59 | Admitting: Internal Medicine

## 2011-04-04 ENCOUNTER — Telehealth: Payer: Self-pay | Admitting: *Deleted

## 2011-04-04 NOTE — Telephone Encounter (Signed)
Message copied by Regis Bill on Fri Apr 04, 2011 12:04 PM ------      Message from: Cassell Clement      Created: Tue Apr 01, 2011  7:10 PM       Please report.  The blood tests are satisfactory, continue same meds

## 2011-04-04 NOTE — Op Note (Signed)
NAMEYARIEL, FERRARIS               ACCOUNT NO.:  [id number]   MEDICAL RECORD NO.:  [id number]          PATIENT TYPE:  AMB   LOCATION:  DSC                          FACILITY:  [facility]   PHYSICIAN:  Rose Phi. Maple Hudson, M.D.   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   OPERATION:  1.  Blue dye injection.  2.  Left axillary sentinel lymph node biopsy.  3.  Wide excision of melanoma of the left posterior shoulder.   SURGEON:  Rose Phi. Maple Hudson, M.D.   ANESTHESIA:  General.   DESCRIPTION OF PROCEDURE:  Prior to coming to the operating room, 0.5  millicuries of technetium sulfur colloid was injected intradermally around  the melanoma on the left posterior shoulder.   After suitable general anesthesia was induced, the patient was placed in the  supine position after having injected 1 mL of Lymphazurin blue intradermally  around the melanoma.  We then prepped and draped the left axilla and  carefully scanned it with the Neoprobe, and there was one hot spot.  A  transverse left axillary incision was made with dissection through the  subcutaneous tissue to the clavipectoral fascia.  Careful dissection  identified a hot and blue lymph node which we removed as a sentinel lymph  node, and there were no other palpable blue or hot nodes.  With that  removed, we closed the incision with 3-0 Vicryl and subcuticular 4-0  Monocryl and Steri-Strips.   The patient was then turned into the prone position, and then the melanoma  area was prepped and draped.  An elliptical, longitudinally oriented  incision with about a 1.5-cm margin on either side of the old scar was then  outlined and then made with dissection and removal down to the fascia.  We  had good hemostasis.  We  mobilized the subcutaneous flaps and then closed  the 10 x 4.5-cm defect in 2 layers of 3-0 Vicryl and interrupted 4-0 nylon  sutures.  Dressing was then applied, and the patient was transferred to the  recovery room in satisfactory condition having tolerated the procedure well.      Pete   PRY/MEDQ  D:  11/26/2004  T:  11/26/2004  Job:  [id number]

## 2011-04-04 NOTE — Telephone Encounter (Signed)
Patient called back, advised of labs.

## 2011-04-04 NOTE — Op Note (Signed)
NAMELYNDELL, ALLAIRE               ACCOUNT NO.:  [id number]   MEDICAL RECORD NO.:  [id number]          PATIENT TYPE:  AMB   LOCATION:  DSC                          FACILITY:  [facility]   PHYSICIAN:  Rose Phi. Maple Hudson, M.D.   DATE OF BIRTH:  Oct 17, 1931   DATE OF PROCEDURE:  DATE OF DISCHARGE:                                 OPERATIVE REPORT   PREOPERATIVE DIAGNOSIS:  T1A melanoma of the left shoulder.   POSTOPERATIVE DIAGNOSIS:  T1A melanoma of the left shoulder.   OPERATION:  Wide excision of melanoma.   SURGEON:  Rose Phi. Maple Hudson, M.D.   ANESTHESIA:  Local.   OPERATIVE PROCEDURE:  The patient was placed on the operating table in the  prone position, and the left posterior shoulder area prepped and draped.  I  designed an elliptical incision with a 1 cm margin around the scar.  This  was then excised, and it was longitudinally oriented.  This was excised  after infiltrating with a local anesthetic.  There was good hemostasis.  A  defect of 5.5 x 2.5 cm was created.   It was then closed in two layers with 3-0 Vicryl and then multiple 4-0 nylon  sutures.   Dressings were applied and the patient allowed to go home.      Pete   PRY/MEDQ  D:  10/29/2004  T:  10/29/2004  Job:  [id number]

## 2011-04-04 NOTE — Op Note (Signed)
[place]  Patient:    [name] [name], [name] [name] Visit Number: [id number] MRN: [id number]          Service Type: DSU Location: [facility] Attending Physician:  Cornell Barman Dictated by:   Lenard Galloway Chaney Malling, M.D. Proc. Date: 02/08/02 Admit Date:  02/08/2002 Discharge Date: 02/09/2002                             Operative Report  PREOPERATIVE DIAGNOSIS:  Impingement syndrome, left shoulder, with possible partial tear of rotator cuff left shoulder.  POSTOPERATIVE DIAGNOSIS:  Impingement syndrome, left shoulder, with possible partial tear of rotator cuff left shoulder.  OPERATION:  Diagnostic arthroscopy of left shoulder; Neer anterior 1/3 acromioplasty, repair of rotator cuff tear left shoulder.  SURGEON:  Lenard Galloway. Chaney Malling, M.D.  ANESTHESIA:  General.  DESCRIPTION OF PROCEDURE:  After satisfactory general anesthesia, the patient was placed on the operating room table in a semi-seated position.  The left shoulder and upper extremity was prepped with Duraprep and draped out in the usual manner.  An arthroscope was placed in the posterior portal, and a very careful examination of the shoulder was undertaken.  The articular cartilage of the humeral head and the glenoid appeared absolutely normal.  The entire labrum anterior and posterior appeared fairly normal.  There was some synovitis about the attachment of the biceps superiorly, and some synovitis on the inferior surface of the rotator cuff.  No obvious clear cut tear could be seen.  No loose bodies were seen in the shoulder.  No other significant abnormalities.  The arthroscope was then passed in the subacromial space.  The undersurface of the acromion appeared fairly normal.  There were some areas of erosion of the rotator cuff in the area of the impingement.  This should fraying of the supraspinatus and some bleeding in this area.  A subacromial incision was made over anterior lateral  aspect of the left shoulder.  Skin edges were retracted.  Bleeders were coagulated.  Deltoid fibers were taken off of the anterior aspect of the acromion only.  Using a power saw, a very generous Neer anterior 1/3 acromioplasty was done.  Once this was accomplished, this gave much better access to the shoulder.  There was a very large second subacromial bursa which was totally excised.  The distal end of the clavicle and the ______ could be felt to be somewhat enlarged.  The power saw was also used in this area, and the osteophytes and the surface of the clavicle were removed.  There was an area of erosion about the rotator cuff, but no obvious hole.  This appeared as it did appear with arthroscope.  Saline was injected in the glenohumeral joint, and fluid oozed out of this area.  Thus, this represented a partial tear.  This area was then elliptically excised.  The edges were then sutured with 0 Tycron.  A water tight closure was achieved.  Throughout the procedure, the shoulder was irrigated with copious amounts of antibiotic solution.  The deltoid fiber was then reattached with heavy Vicryl, 2-0 Vicryl was used to close subcutaneous tissue, and stainless steel staples were used to close the skin.  Sterile dressings were applied.  The patient returned to the recovery room in excellent condition.  This procedure went extremely well.  DRAINS:  None.  COMPLICATIONS:  None. Dictated by:   Lenard Galloway Chaney Malling, M.D. Attending Physician:  Cornell Barman DD:  02/08/02 TD:  02/09/02 Job: [id number]

## 2011-04-04 NOTE — Op Note (Signed)
[place]  Patient:    [name] [name], [name] [name]                      MRN: [id number] Proc. Date: 12/27/99 Adm. Date:  [id number] Attending:  Charlton Haws                           Operative Report  CCS NUMBER:  [id number]  PREOPERATIVE DIAGNOSIS:  Melanoma of left lower extremity.  POSTOPERATIVE DIAGNOSIS:  Melanoma of left lower extremity.  OPERATION:  Wide local excision of superficial spreading melanoma.  SURGEON:  Currie Paris, M.D.  ANESTHESIA:  Local.  CLINICAL HISTORY:  The patient is a 77 year old who recently had a superficial spreading melanoma removed from the lower leg.  We needed to get wider margins.  DESCRIPTION OF PROCEDURE:  The patient was taken to the minor procedure room. he leg area was shaved.  The prior excisional site was circular and measured 8 mm. I took a 1 cm margin on either side of the excision transversely and that gave me  approximately 2.8 cm in width.  I then took approximately a 7 cm in length ellipse of skin.  This was marked out and the skin incision made.  A suture tag was placed on the superior corner for orientation purposes, and skin and a portion of the subcutaneous tissue removed.  Bleeders were either clamped and tied, or suture ligated with 3-0 and 4-0 Vicryl.  The excision was completed.  There was a little ecchymosis at the deep side on the subcutaneous tissue, and I thought this might have been a little hemorrhage left over from the prior excision, but I went ahead and at that portion of the subcutaneous, took the remainder of the subcutaneous  down to the fascia so that we had a complete margin down to the fascia.  Once everything was dry, I closed in layers using 3-0 Vicryl for the deep layer, and then 4-0 nylon running for the skin.  The patient tolerated the procedure well. Sterile dressings were applied. DD:  12/27/99 TD:  12/28/99 Job: [id number]

## 2011-04-04 NOTE — Progress Notes (Signed)
Left message

## 2011-04-04 NOTE — Op Note (Signed)
[name] [name]               ACCOUNT NO.:  [id number]   MEDICAL RECORD NO.:  [id number]          PATIENT TYPE:  AMB   LOCATION:  DSC                          FACILITY:  [facility]   PHYSICIAN:  [name] [name], M.D.DATE OF BIRTH:  January 16, 1931   DATE OF PROCEDURE:  02/04/2007  DATE OF DISCHARGE:                               OPERATIVE REPORT   JUSTIFICATION:  A 77 year old male who has had a previous knee scope on  the left.  He was doing well about two months ago and he has had some  pain in his left knee.  No specific injury.  It is constant and sharp.  Is felt about the quadriceps and superior pole of patella.  There is no  real crepitus on range of motion about the left patella.  Full flexion,  full extension.  Some tenderness about superior pole of the patella.  X-  rays were unremarkable.  An MRI of the knee was done and this shows a  partial tear of the distal quadriceps in the area of tenderness.  There  is a diminutive lateral meniscus from previous meniscectomy but there is  also some irregularity of the free edge, which is consistent with a new  tear of the posterior horn of the lateral meniscus.  This is also some  full-thickness cartilage loss about the posterior aspect of the lateral  tibial plateau.  Because of persistent pain and discomfort, it is felt  that arthroscopic evaluation of the knee and repair of the quadriceps  was indicated and necessary since he has failed conservative care.  Questions answered and encourage preoperatively.  Complications  discussed extensively.   JUSTIFICATION FOR OUTPATIENT SURGERY:  Minimal morbidity.   PREOPERATIVE DIAGNOSIS:  Partial tear quadriceps left knee; possible  lateral meniscal tear of left knee.   POSTOPERATIVE DIAGNOSIS:  Partial tear quadriceps tendon and partial  evulsion vastus medialis obliquus quadriceps patellar junction; tear  leading edge posterior horn lateral meniscus left knee.   OPERATION:   Arthroscopy; debridement posterior horn lateral meniscus;  open repair of partial tear quadriceps tendon and reattachment of VMO to  the area of defect.   SURGEON:  [name] [name], M.D.   Threasa HeadsChestine Spore.   ANESTHESIA:  General.   PATHOLOGY:  With the arthroscope in the knee, a very careful examination  was undertaken.  Patellofemoral joint showed some early degenerative  changes.  In the medial compartment, there was normal articular  cartilage of the medial femoral condyle and medial tibial plateau and  entire circumference of the medial meniscus was normal.  The anterior  cruciate ligament was visualized and this appeared normal.  In the  lateral compartment, there was some thinning of articular cartilage of  lateral tibial plateau but there was a tear of the leading edge of the  posterior horn of the lateral meniscus.  The patient did have some  surgery on this meniscus previously but the majority of the meniscus was  still functioning and intact.   PROCEDURE:  The patient placed on the operating table in the supine  position.  Pneumatic tourniquet  about the left upper thigh.  The entire  left lower extremity was prepped with DuraPrep and draped out in the  usual manner.  Leg was then wrapped out with an Esmarch, tourniquet  elevated.  An infusion cannula was placed in the superior medial pouch  and knee distended saline.  Anteromedial and anterolateral portals were  made and the arthroscope was introduced.  The findings were as described  above.  The only significant pathology seen in the lateral compartment.  The patient had tear leading edge of the posterior horn  of the lateral  meniscus.  Through both medial and lateral portals a series of baskets  were inserted and this torn area was debrided.  This was followed up  with the intra-articular shaver and all debris was removed.  The edge  was then debrided with nice transition of mid third of the lateral  meniscus.   The arthroscope was then removed.   Incision made superior to the pole of patella.  Skin edges were  retracted.  The quadriceps tendon felt intact and had normal consistency  except on the medial edge, it felt thinner and less robust and there was  partial tear of the VMO which had retracted slightly.  This area was  then plicated with heavy Vicryl sutures and the VMO was then advanced to  its normal anatomic position and reattached with heavy Vicryl sutures.  Subcutaneous tissue closed with 2-0 Vicryl.  Skin was closed with  stainless steel staples.  Sterile dressings were applied and the patient  returned to recovery room in condition.   DRAINS:  None.   COMPLICATIONS:  None.  I was very satisfied the surgical reconstruction.   DISPOSITION:  1. Percocet for pain.  2. Knee immobilizer but may be full weightbearing.  3. Return to my office on Wednesday.           ______________________________  [name] [name], M.D.     RAM/MEDQ  D:  02/04/2007  T:  02/04/2007  Job:  [id number]

## 2011-04-04 NOTE — Progress Notes (Signed)
Advised patient

## 2011-04-09 ENCOUNTER — Ambulatory Visit (INDEPENDENT_AMBULATORY_CARE_PROVIDER_SITE_OTHER): Payer: 59 | Admitting: Internal Medicine

## 2011-04-09 ENCOUNTER — Encounter: Payer: Self-pay | Admitting: Internal Medicine

## 2011-04-09 DIAGNOSIS — H6120 Impacted cerumen, unspecified ear: Secondary | ICD-10-CM

## 2011-04-09 DIAGNOSIS — H9190 Unspecified hearing loss, unspecified ear: Secondary | ICD-10-CM | POA: Insufficient documentation

## 2011-04-09 NOTE — Progress Notes (Signed)
Subjective:    Patient ID: [name] [name], male    DOB: Jan 11, 1931, 77 y.o.   MRN: [id number]  HPI  77 y/o male for follow up. Pt recently seen by cardiologist.   Cardiac status is stable.  Dr Patty Sermons noted cerumen impaction.  He also noticed hearing loss over the years.  Remote hearing test in there past.  He is not sure what type of hearing loss he has.     Review of Systems He denies ear fullness,  Negative for tinnitus    Past Medical History  Diagnosis Date  . Prostate cancer   . Melanoma     Left Shoulder  . Vision abnormalities     Cornea scarring  . Osteoarthritis     Knees  . Hypertension   . Pure hypercholesterolemia   . Undiagnosed cardiac murmurs   . Esophageal reflux   . Depressive disorder, not elsewhere classified   . MVP (mitral valve prolapse)   . Personal history of unspecified circulatory disease   . Personal history of colonic polyps   . Personal history of malignant neoplasm of prostate     History   Social History  . Marital Status: Widowed    Spouse Name: N/A    Number of Children: N/A  . Years of Education: N/A   Occupational History  . Not on file.   Social History Main Topics  . Smoking status: Never Smoker   . Smokeless tobacco: Not on file  . Alcohol Use: No  . Drug Use: No  . Sexually Active: Not on file   Other Topics Concern  . 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 Nuclear stress test 09/2006    EF-64%, Normal  . US echocardiography 09/2009    mild LVH,mild AI,MVP with mild MR, mild-mod. TR with mild Pulm. HTN, EF-55-60%  . Inguinal hernia repair 09/2009    Left  . Knee arthroscopy     x 2  . Colonoscopy w/ polypectomy   . Prostatectomy 2003  . Rotator cuff repair 2003    left  . Melanoma surgery     2001, 2005, 2006, 2009    Family History  Problem Relation Age of Onset  . Clotting disorder Brother     CVA's  .  Arthritis Mother   . Hypertension Mother   . Hypertension Father   . Psychosis Father     psychiatric care  . Stroke Mother     No Known Allergies  Current Outpatient Prescriptions on File Prior to Visit  Medication Sig Dispense Refill  . acetaminophen (TYLENOL) 500 MG tablet Take 500 mg by mouth every 6 (six) hours as needed.        Marland Kitchen aspirin 81 MG tablet Take 81 mg by mouth daily.        Marland Kitchen atorvastatin (LIPITOR) 10 MG tablet Take 10 mg by mouth daily.        . celecoxib (CELEBREX) 200 MG capsule Take 200 mg by mouth daily.        . metoprolol (TOPROL-XL) 100 MG 24 hr tablet Take 100 mg by mouth daily.        . TRAZODONE HCL PO Take 300 mg by mouth at bedtime.          BP 100/70  Pulse 68  Temp(Src) 97.7 F (36.5 C) (Oral)  Resp 18  Wt 203 lb (92.08 kg)  SpO2 98%    Objective:   Physical Exam    Constitutional: Appears well-developed and well-nourished. No distress.  Head: Normocephalic and atraumatic.  Right Ear: External ear normal. Right cerumen impaction.  Unable to hear finger rub Left Ear: External ear normal. Left cerumen impaction.  Unable to hear finger rub Cardiovascular: Normal rate, regular rhythm and normal heart sounds.  Exam reveals no gallop and no friction rub.   Pulmonary/Chest: Effort normal and breath sounds normal.  No wheezes. No rales.  Neurological: Alert. No cranial nerve deficit.  Skin: Skin is warm and dry.  Psychiatric: Normal mood and affect. Behavior is normal.      Assessment & Plan:

## 2011-04-09 NOTE — Assessment & Plan Note (Signed)
Pt with bilateral cerumen impaction.  Irrigated both ears and used curette to remove large cerumen plug.   No complications

## 2011-04-09 NOTE — Assessment & Plan Note (Signed)
Pt with bilateral mild hearing loss.  I suspect high freq hearing loss.  He declines referral to ENT or audiologist for now.

## 2011-05-12 ENCOUNTER — Other Ambulatory Visit: Payer: Self-pay | Admitting: Cardiology

## 2011-05-12 DIAGNOSIS — E785 Hyperlipidemia, unspecified: Secondary | ICD-10-CM

## 2011-05-12 DIAGNOSIS — I10 Essential (primary) hypertension: Secondary | ICD-10-CM

## 2011-05-12 MED ORDER — ATORVASTATIN CALCIUM 10 MG PO TABS: 10.0000 mg | ORAL_TABLET | Freq: Every day | ORAL | Status: DC

## 2011-05-12 MED ORDER — METOPROLOL SUCCINATE ER 100 MG PO TB24: 100.0000 mg | ORAL_TABLET | Freq: Every day | ORAL | Status: DC

## 2011-05-12 NOTE — Telephone Encounter (Signed)
Refilled to [facility] per patient request

## 2011-05-12 NOTE — Telephone Encounter (Signed)
Called wanting a refill of Toporol (metoprolol) 90 day supply, Lipitor (if there is a generic he would like it)90 day supply. Please call back. I have pulled the chart.

## 2011-06-04 ENCOUNTER — Ambulatory Visit: Payer: Medicare Other | Attending: Internal Medicine | Admitting: Physical Therapy

## 2011-06-04 DIAGNOSIS — M6281 Muscle weakness (generalized): Secondary | ICD-10-CM | POA: Insufficient documentation

## 2011-06-04 DIAGNOSIS — M25569 Pain in unspecified knee: Secondary | ICD-10-CM | POA: Insufficient documentation

## 2011-06-04 DIAGNOSIS — IMO0001 Reserved for inherently not codable concepts without codable children: Secondary | ICD-10-CM | POA: Insufficient documentation

## 2011-06-04 DIAGNOSIS — M25669 Stiffness of unspecified knee, not elsewhere classified: Secondary | ICD-10-CM | POA: Insufficient documentation

## 2011-06-09 ENCOUNTER — Ambulatory Visit: Payer: Medicare Other | Admitting: Physical Therapy

## 2011-06-11 ENCOUNTER — Ambulatory Visit: Payer: Medicare Other | Admitting: Physical Therapy

## 2011-06-16 ENCOUNTER — Ambulatory Visit: Payer: Medicare Other | Admitting: Physical Therapy

## 2011-06-18 ENCOUNTER — Ambulatory Visit: Payer: Medicare Other | Attending: Internal Medicine | Admitting: Physical Therapy

## 2011-06-18 DIAGNOSIS — IMO0001 Reserved for inherently not codable concepts without codable children: Secondary | ICD-10-CM | POA: Insufficient documentation

## 2011-06-18 DIAGNOSIS — M25669 Stiffness of unspecified knee, not elsewhere classified: Secondary | ICD-10-CM | POA: Insufficient documentation

## 2011-06-18 DIAGNOSIS — M25569 Pain in unspecified knee: Secondary | ICD-10-CM | POA: Insufficient documentation

## 2011-06-18 DIAGNOSIS — M6281 Muscle weakness (generalized): Secondary | ICD-10-CM | POA: Insufficient documentation

## 2011-06-24 ENCOUNTER — Ambulatory Visit: Payer: Medicare Other | Admitting: Physical Therapy

## 2011-06-27 ENCOUNTER — Ambulatory Visit: Payer: Medicare Other | Admitting: Physical Therapy

## 2011-06-30 ENCOUNTER — Ambulatory Visit: Payer: Medicare Other | Admitting: Physical Therapy

## 2011-07-01 ENCOUNTER — Telehealth: Payer: Self-pay | Admitting: Cardiology

## 2011-07-01 DIAGNOSIS — I059 Rheumatic mitral valve disease, unspecified: Secondary | ICD-10-CM

## 2011-07-01 NOTE — Telephone Encounter (Signed)
Advised patient and he will go for chest xray

## 2011-07-01 NOTE — Telephone Encounter (Signed)
Pt has questions about urinary issues that his urologist told him to ask his cardiologist about.  Please call pt back regarding questions.

## 2011-07-01 NOTE — Telephone Encounter (Signed)
Patient states he is up lot urinating at night and saw urologist. Urologist said he was concerned that it could be CHF.   Patient denies any shortness of breath or swelling.  Next appointment in several months, see before then? Please advise

## 2011-07-01 NOTE — Telephone Encounter (Signed)
His last chest x-ray was in November 2011.  Have him get a chest x-ray

## 2011-07-01 NOTE — Telephone Encounter (Signed)
Left message

## 2011-07-02 ENCOUNTER — Ambulatory Visit
Admission: RE | Admit: 2011-07-02 | Discharge: 2011-07-02 | Disposition: A | Discharge status: Home / Self Care | Payer: 59 | Source: Ambulatory Visit | Attending: Cardiology | Admitting: Cardiology

## 2011-07-02 DIAGNOSIS — I059 Rheumatic mitral valve disease, unspecified: Secondary | ICD-10-CM

## 2011-07-03 ENCOUNTER — Ambulatory Visit: Payer: Medicare Other | Admitting: Physical Therapy

## 2011-07-03 ENCOUNTER — Telehealth: Payer: Self-pay | Admitting: *Deleted

## 2011-07-03 NOTE — Telephone Encounter (Signed)
Advised of results

## 2011-07-03 NOTE — Telephone Encounter (Signed)
Message copied by Burnell Blanks on Thu Jul 03, 2011  9:51 AM ------      Message from: Cassell Clement      Created: Wed Jul 02, 2011  3:06 PM       Please report.  The chest x-ray does not show any evidence of congestive heart failure.  The heart size is normal and the lungs are clear and free of fluid.

## 2011-07-03 NOTE — Progress Notes (Signed)
Advised of chest xray results 

## 2011-07-07 ENCOUNTER — Encounter: Payer: Self-pay | Admitting: Internal Medicine

## 2011-07-08 ENCOUNTER — Ambulatory Visit: Payer: Medicare Other | Admitting: Physical Therapy

## 2011-07-11 ENCOUNTER — Ambulatory Visit: Payer: Medicare Other | Admitting: Physical Therapy

## 2011-07-15 ENCOUNTER — Ambulatory Visit: Payer: Medicare Other | Admitting: Physical Therapy

## 2011-07-17 ENCOUNTER — Ambulatory Visit: Payer: Medicare Other | Admitting: Physical Therapy

## 2011-07-22 ENCOUNTER — Ambulatory Visit: Payer: Medicare Other | Attending: Internal Medicine | Admitting: Physical Therapy

## 2011-07-22 DIAGNOSIS — M6281 Muscle weakness (generalized): Secondary | ICD-10-CM | POA: Insufficient documentation

## 2011-07-22 DIAGNOSIS — IMO0001 Reserved for inherently not codable concepts without codable children: Secondary | ICD-10-CM | POA: Insufficient documentation

## 2011-07-22 DIAGNOSIS — M25669 Stiffness of unspecified knee, not elsewhere classified: Secondary | ICD-10-CM | POA: Insufficient documentation

## 2011-07-22 DIAGNOSIS — M25569 Pain in unspecified knee: Secondary | ICD-10-CM | POA: Insufficient documentation

## 2011-08-19 ENCOUNTER — Telehealth: Payer: Self-pay | Admitting: Cardiology

## 2011-08-19 NOTE — Telephone Encounter (Signed)
Agree with plan.  His mitral regurgitation may be getting worse.

## 2011-08-19 NOTE — Telephone Encounter (Signed)
Pt calling c/o elevated pulse rate, medication keeps it 60-65 and pt pulse rate is now up in the 90's. Pt also c/o sob (when pt goes upstairs) and headaches. Pt DOES NOT have chest pain. Pt said symptoms have been prevalent for about one week.    Pt BP seems to be ok.   Please return pt call to discuss further.

## 2011-08-19 NOTE — Telephone Encounter (Signed)
Scheduled appointment with Lawson Fiscal for Thursday am.  Lorain Childes

## 2011-08-21 ENCOUNTER — Encounter: Payer: Self-pay | Admitting: Nurse Practitioner

## 2011-08-21 ENCOUNTER — Ambulatory Visit (INDEPENDENT_AMBULATORY_CARE_PROVIDER_SITE_OTHER): Payer: 59 | Admitting: Nurse Practitioner

## 2011-08-21 ENCOUNTER — Ambulatory Visit (HOSPITAL_COMMUNITY): Payer: Medicare Other | Attending: Cardiology

## 2011-08-21 DIAGNOSIS — I059 Rheumatic mitral valve disease, unspecified: Secondary | ICD-10-CM | POA: Insufficient documentation

## 2011-08-21 DIAGNOSIS — R0600 Dyspnea, unspecified: Secondary | ICD-10-CM

## 2011-08-21 DIAGNOSIS — R0989 Other specified symptoms and signs involving the circulatory and respiratory systems: Secondary | ICD-10-CM | POA: Insufficient documentation

## 2011-08-21 DIAGNOSIS — I38 Endocarditis, valve unspecified: Secondary | ICD-10-CM

## 2011-08-21 DIAGNOSIS — I10 Essential (primary) hypertension: Secondary | ICD-10-CM | POA: Insufficient documentation

## 2011-08-21 DIAGNOSIS — R0602 Shortness of breath: Secondary | ICD-10-CM

## 2011-08-21 DIAGNOSIS — R0609 Other forms of dyspnea: Secondary | ICD-10-CM | POA: Insufficient documentation

## 2011-08-21 DIAGNOSIS — E785 Hyperlipidemia, unspecified: Secondary | ICD-10-CM | POA: Insufficient documentation

## 2011-08-21 DIAGNOSIS — I359 Nonrheumatic aortic valve disorder, unspecified: Secondary | ICD-10-CM

## 2011-08-21 LAB — BASIC METABOLIC PANEL
BUN: 24 mg/dL — ABNORMAL HIGH (ref 6–23)
CO2: 25 mEq/L (ref 19–32)
Calcium: 9.2 mg/dL (ref 8.4–10.5)
Chloride: 108 mEq/L (ref 96–112)
Creatinine, Ser: 1 mg/dL (ref 0.4–1.5)
GFR: 75 mL/min (ref >60.00)
Glucose, Bld: 119 mg/dL — ABNORMAL HIGH (ref 70–99)
Potassium: 4.1 mEq/L (ref 3.5–5.1)
Sodium: 140 mEq/L (ref 135–145)

## 2011-08-21 LAB — CBC WITH DIFFERENTIAL/PLATELET
Basophils Absolute: 0 10*3/uL (ref 0.0–0.1)
Basophils Relative: 0.3 % (ref 0.0–3.0)
Eosinophils Absolute: 0 10*3/uL (ref 0.0–0.7)
Eosinophils Relative: 0.2 % (ref 0.0–5.0)
HCT: 40.8 % (ref 39.0–52.0)
Hemoglobin: 13.8 g/dL (ref 13.0–17.0)
Lymphocytes Relative: 18.4 % (ref 12.0–46.0)
Lymphs Abs: 1.1 10*3/uL (ref 0.7–4.0)
MCHC: 33.9 g/dL (ref 30.0–36.0)
MCV: 92.3 fl (ref 78.0–100.0)
Monocytes Absolute: 0.8 10*3/uL (ref 0.1–1.0)
Monocytes Relative: 13.5 % — ABNORMAL HIGH (ref 3.0–12.0)
Neutro Abs: 3.9 10*3/uL (ref 1.4–7.7)
Neutrophils Relative %: 67.6 % (ref 43.0–77.0)
Platelets: 104 10*3/uL — ABNORMAL LOW (ref 150.0–400.0)
RBC: 4.42 Mil/uL (ref 4.22–5.81)
RDW: 12.8 % (ref 11.5–14.6)
WBC: 5.7 10*3/uL (ref 4.5–10.5)

## 2011-08-21 LAB — BRAIN NATRIURETIC PEPTIDE: Pro B Natriuretic peptide (BNP): 310 pg/mL — ABNORMAL HIGH (ref 0.0–100.0)

## 2011-08-21 NOTE — Progress Notes (Signed)
[name] [name] Date of Birth: 03-03-1931   History of Present Illness: [name] [name] is seen today for a work in visit. He is seen for [name] [name]. He has a one to two week history of worsening DOE. Heart rate has been a little higher. He is not having any chest discomfort/tightness, presyncope/syncope, or any edema. He does have some nocturia and was told that this may be a sign of CHF. He did have a negative CXR back in August. He does not really exercise because of bad knees. Blood pressure at home has been ok.   Current Outpatient Prescriptions on File Prior to Visit  Medication Sig Dispense Refill  . acetaminophen (TYLENOL) 500 MG tablet Take 500 mg by mouth every 6 (six) hours as needed.        Marland Kitchen aspirin 81 MG tablet Take 81 mg by mouth daily.        Marland Kitchen atorvastatin (LIPITOR) 10 MG tablet Take 1 tablet (10 mg total) by mouth daily.  90 tablet  3  . celecoxib (CELEBREX) 200 MG capsule Take 200 mg by mouth daily.        . metoprolol (TOPROL-XL) 100 MG 24 hr tablet Take 1 tablet (100 mg total) by mouth daily.  90 tablet  3  . TRAZODONE HCL PO Take 300 mg by mouth at bedtime.          No Known Allergies  Past Medical History  Diagnosis Date  . Prostate cancer   . Melanoma     Left Shoulder  . Vision abnormalities     Cornea scarring  . Osteoarthritis     Knees  . Hypertension   . Pure hypercholesterolemia   . Esophageal reflux   . Depressive disorder, not elsewhere classified   . MVP (mitral valve prolapse)   . Personal history of colonic polyps   . Valvular heart disease     Last echo in 2010 with EF 55 to 60%, +MVP, mild MR, diastolic dysfunction, biatrial enlargement, mild AI and mild pulmonary HTN  . Normal nuclear stress test 2007    Past Surgical History  Procedure Date  . Nuclear stress test 09/2006    EF-64%, Normal  . US echocardiography 09/2009    mild LVH,mild AI,MVP with mild MR, mild-mod. TR with mild Pulm. HTN, EF-55-60%  . Inguinal hernia repair  09/2009    Left  . Knee arthroscopy     x 2  . Colonoscopy w/ polypectomy   . Prostatectomy 2003  . Rotator cuff repair 2003    left  . Melanoma surgery     2001, 2005, 2006, 2009    History  Smoking status  . Never Smoker   Smokeless tobacco  . Not on file    History  Alcohol Use No    Family History  Problem Relation Age of Onset  . Clotting disorder Brother     CVA's  . Arthritis Mother   . Hypertension Mother   . Hypertension Father   . Psychosis Father     psychiatric care  . Stroke Mother     Review of Systems: The review of systems is per the HPI.  All other systems were reviewed and are negative.  Physical Exam: BP 150/90  Pulse 87  Ht 6\' 5"  (1.956 m)  Wt 208 lb (94.348 kg)  BMI 24.67 kg/m2  SpO2 95% Patient is very pleasant and in no acute distress. He is very tall. Skin is warm and dry.  Color is normal.  HEENT is unremarkable. Normocephalic/atraumatic. PERRL. Sclera are nonicteric. Neck is supple. No masses. No JVD. Lungs are clear. Cardiac exam shows a regular rate and rhythm. He has a 2-3/6 murmur of MR noted.  Abdomen is soft. Extremities are without edema. Gait and ROM are intact. No gross neurologic deficits noted.   LABORATORY DATA: EKG shows sinus. No acute changes. Tracing was reviewed with [name] [name].   Assessment / Plan:

## 2011-08-21 NOTE — Patient Instructions (Signed)
We are going to get another ultrasound of your heart to look at your pumping function We are going to check some labs that tell us why you are short of breath I will have you see Dr. Patty Sermons to go over your tests.  Call for any problems.

## 2011-08-21 NOTE — Assessment & Plan Note (Signed)
He has known MVP.He is now having more DOE. He does not have evidence of volume overload on exam today. CXR was recently done and was negative for CHF. Last echo in 2010. His murmur does seem louder to me today. We will check an echo later today. I have ordered labs to include BNP, BMET and CBC looking for other causes. No change in his medicines for now. I will have Dr. Patty Sermons follow up with the patient.

## 2011-08-22 ENCOUNTER — Encounter: Payer: Self-pay | Admitting: *Deleted

## 2011-08-26 ENCOUNTER — Telehealth: Payer: Self-pay | Admitting: *Deleted

## 2011-08-26 ENCOUNTER — Telehealth: Payer: Self-pay | Admitting: Cardiology

## 2011-08-26 NOTE — Telephone Encounter (Signed)
Pt calling wanting to results of pt ECHO and lab work to try and figure out why pt is sob. Please return pt call to discuss further.

## 2011-08-26 NOTE — Telephone Encounter (Signed)
Advised and schedule office visit for tomorrow

## 2011-08-26 NOTE — Progress Notes (Signed)
Advised and scheduled for ov tomorrow

## 2011-08-26 NOTE — Telephone Encounter (Signed)
Message copied by Burnell Blanks on Tue Aug 26, 2011  4:47 PM ------      Message from: Cassell Clement      Created: Fri Aug 22, 2011  5:26 PM       Please report.  The blood tests show that the heart failure enzyme is somewhat elevated.  The echocardiogram shows severe mitral regurgitation.  His shortness of breath is felt to be secondary to his severe mitral regurgitation.  We want to have him come into the office in to discuss possible mitral valve repair.

## 2011-08-26 NOTE — Telephone Encounter (Signed)
Advised patient and scheduled office visit for tomorrow

## 2011-08-27 ENCOUNTER — Ambulatory Visit (INDEPENDENT_AMBULATORY_CARE_PROVIDER_SITE_OTHER): Payer: Medicare Other | Admitting: Cardiology

## 2011-08-27 ENCOUNTER — Encounter: Payer: Self-pay | Admitting: Cardiology

## 2011-08-27 VITALS — BP 135/86 | HR 84 | Ht 77.0 in | Wt 204.0 lb

## 2011-08-27 DIAGNOSIS — R0600 Dyspnea, unspecified: Secondary | ICD-10-CM

## 2011-08-27 DIAGNOSIS — C439 Malignant melanoma of skin, unspecified: Secondary | ICD-10-CM

## 2011-08-27 DIAGNOSIS — I059 Rheumatic mitral valve disease, unspecified: Secondary | ICD-10-CM

## 2011-08-27 DIAGNOSIS — D696 Thrombocytopenia, unspecified: Secondary | ICD-10-CM

## 2011-08-27 DIAGNOSIS — I341 Nonrheumatic mitral (valve) prolapse: Secondary | ICD-10-CM

## 2011-08-27 DIAGNOSIS — Z8679 Personal history of other diseases of the circulatory system: Secondary | ICD-10-CM

## 2011-08-27 MED ORDER — LISINOPRIL 10 MG PO TABS: 10.0000 mg | ORAL_TABLET | Freq: Every day | ORAL | Status: DC

## 2011-08-27 NOTE — Patient Instructions (Signed)
Start Lisinopril 10 mg daily  Will see Dr [place] on 10/17 at 12:00

## 2011-08-27 NOTE — Assessment & Plan Note (Signed)
The patient has a past history of frequent, symptomatic PVCs.  These have been suppressed with beta blocker.

## 2011-08-27 NOTE — Assessment & Plan Note (Signed)
The patient has a known history of chronic mild thrombocytopenia in the range of 100,000 platelets.  If the patient comes to mitral valve repair via thrombocytopenia may need to be addressed.  The patient is followed for this by his internist and also he sees oncology once a year because of a remote history of having had 3 malignant melanomas.  Over the course of the past 10 years, the most recent one having been removed 3 years ago.

## 2011-08-27 NOTE — Progress Notes (Signed)
[name] [name] Date of Birth:  13-Sep-1931 [facility] [address]   [address] [place], [place]  [address] [phone]           Fax   [phone]  History of Present Illness: This pleasant 77 year old gentleman is seen for a followup office visit.  He has a history of mitral valve prolapse.  Recently he has noted increasing shortness of breath with activities such as climbing stairs and he was seen by Lawson Fiscal several weeks ago.  A brain natruretic peptide was noted to be elevated and an echocardiogram was obtained on 08/22/11, which showed that the echocardiogram now showed severe mitral regurgitation whereas previous echocardiogram on 10/02/09 had shown only mild regurgitation.  Also, his pulmonary artery pressure had increased from  4 2 mm mercury to 61 mm mercury.  The patient has not expressed any chest pain.  He has a past history of frequent PVCs related to his mitral valve prolapse and has been on long-term beta blocker.  He has not been on an Ace inhibitor.  Current Outpatient Prescriptions  Medication Sig Dispense Refill  . acetaminophen (TYLENOL) 500 MG tablet Take 500 mg by mouth every 6 (six) hours as needed.        Marland Kitchen aspirin 81 MG tablet Take 81 mg by mouth daily.        Marland Kitchen atorvastatin (LIPITOR) 10 MG tablet Take 1 tablet (10 mg total) by mouth daily.  90 tablet  3  . celecoxib (CELEBREX) 200 MG capsule Take 200 mg by mouth daily.        . metoprolol (TOPROL-XL) 100 MG 24 hr tablet Take 1 tablet (100 mg total) by mouth daily.  90 tablet  3  . RESTASIS 0.05 % ophthalmic emulsion Place 1 drop into both eyes every 12 (twelve) hours.       . TRAZODONE HCL PO Take 300 mg by mouth at bedtime.        Marland Kitchen lisinopril (PRINIVIL) 10 MG tablet Take 1 tablet (10 mg total) by mouth daily.  30 tablet  5    No Known Allergies  Patient Active Problem List  Diagnoses  . TINEA PEDIS  . ADENOCARCINOMA, PROSTATE  . HYPERCHOLESTEROLEMIA  . THROMBOCYTOPENIA  .  DEPRESSION  . HYPERTENSION, BENIGN ESSENTIAL  . MITRAL VALVE PROLAPSE  . GERD  . DERMATITIS, ATOPIC  . CALLUS, LEFT FOOT  . OSTEOARTHRITIS, GENERALIZED, MULTIPLE JOINTS  . BACK PAIN, CHRONIC  . MUSCLE SPASM, BACK  . CARDIAC MURMUR  . PERSONAL HISTORY MALIGNANT NEOPLASM PROSTATE  . PERSONAL HISTORY OF MALIGNANT MELANOMA OF SKIN  . ARRHYTHMIA, HX OF  . PERSONAL HISTORY OF COLONIC POLYPS  . Cerumen impaction  . Hearing loss  . Valvular heart disease    History  Smoking status  . Never Smoker   Smokeless tobacco  . Not on file    History  Alcohol Use No    Family History  Problem Relation Age of Onset  . Clotting disorder Brother     CVA's  . Arthritis Mother   . Hypertension Mother   . Hypertension Father   . Psychosis Father     psychiatric care  . Stroke Mother     Review of Systems: Constitutional: no fever chills diaphoresis or fatigue or change in weight.  Head and neck: no hearing loss, no epistaxis, no photophobia or visual disturbance. Respiratory: No cough, shortness of breath or wheezing. Cardiovascular: No chest pain peripheral edema, palpitations. Gastrointestinal: No  abdominal distention, no abdominal pain, no change in bowel habits hematochezia or melena. Genitourinary: No dysuria, no frequency, no urgency, no nocturia. Musculoskeletal:No arthralgias, no back pain, no gait disturbance or myalgias. Neurological: No dizziness, no headaches, no numbness, no seizures, no syncope, no weakness, no tremors. Hematologic: No lymphadenopathy, no easy bruising. Psychiatric: No confusion, no hallucinations, no sleep disturbance.    Physical Exam: Filed Vitals:   08/27/11 1129  BP: 135/86  Pulse: 84   general appearance reveals al thin gentleman in no acute distress.  Pupils equal and reactive.   Extraocular Movements are full.  There is no scleral icterus.  The mouth and pharynx are normal.  The neck is supple.  The carotids reveal no bruits.  The jugular  venous pressure is normal.  The thyroid is not enlarged.  There is no lymphadenopathy.  The chest is clear to percussion and auscultation. There are no rales or rhonchi. Expansion of the chest is symmetrical.  Heart reveals a grade 3/6 holosystolic murmur of mitral regurgitation at the apex.  No diastolic murmur.  No gallop or rub.The abdomen is soft and nontender. Bowel sounds are normal. The liver and spleen are not enlarged. There Are no abdominal masses. There are no bruits.  The pedal pulses are good.  There is no phlebitis or edema.  There is no cyanosis or clubbing.  The hands show narrowing fingers raising the question of possible Marfan's.Strength is normal and symmetrical in all extremities.  There is no lateralizing weakness.  There are no sensory deficits.  Strength is normal and symmetrical in all extremities.  There is no lateralizing weakness.  There are no sensory deficits.  The skin is warm and dry.  There is no rash.     Assessment / Plan: We will start lisinopril 10 mg one daily for afterload reduction.  We will refer for right and left heart cardiac catheterization.

## 2011-08-27 NOTE — Assessment & Plan Note (Signed)
Recent two-dimensional echocardiogram 08/22/11 shows severe mitral regurgitation related to mitral valve prolapse.

## 2011-09-03 ENCOUNTER — Ambulatory Visit: Payer: Medicare Other | Admitting: Cardiology

## 2011-09-04 ENCOUNTER — Ambulatory Visit (INDEPENDENT_AMBULATORY_CARE_PROVIDER_SITE_OTHER): Payer: Medicare Other | Admitting: Cardiology

## 2011-09-04 ENCOUNTER — Encounter: Payer: Self-pay | Admitting: Cardiology

## 2011-09-04 ENCOUNTER — Encounter: Payer: Self-pay | Admitting: *Deleted

## 2011-09-04 VITALS — BP 143/85 | HR 93 | Ht 72.0 in | Wt 205.0 lb

## 2011-09-04 DIAGNOSIS — I272 Pulmonary hypertension, unspecified: Secondary | ICD-10-CM

## 2011-09-04 DIAGNOSIS — I059 Rheumatic mitral valve disease, unspecified: Secondary | ICD-10-CM

## 2011-09-04 DIAGNOSIS — I340 Nonrheumatic mitral (valve) insufficiency: Secondary | ICD-10-CM

## 2011-09-04 DIAGNOSIS — I2789 Other specified pulmonary heart diseases: Secondary | ICD-10-CM

## 2011-09-04 DIAGNOSIS — D696 Thrombocytopenia, unspecified: Secondary | ICD-10-CM

## 2011-09-04 DIAGNOSIS — I341 Nonrheumatic mitral (valve) prolapse: Secondary | ICD-10-CM

## 2011-09-04 LAB — BASIC METABOLIC PANEL
CO2: 27 mEq/L (ref 19–32)
Chloride: 106 mEq/L (ref 96–112)
Glucose, Bld: 138 mg/dL — ABNORMAL HIGH (ref 70–99)
Sodium: 140 mEq/L (ref 135–145)

## 2011-09-04 LAB — CBC WITH DIFFERENTIAL/PLATELET
Basophils Absolute: 0 10*3/uL (ref 0.0–0.1)
Eosinophils Relative: 0.4 % (ref 0.0–5.0)
HCT: 40.2 % (ref 39.0–52.0)
Hemoglobin: 13.7 g/dL (ref 13.0–17.0)
Lymphocytes Relative: 15 % (ref 12.0–46.0)
Lymphs Abs: 1 10*3/uL (ref 0.7–4.0)
Monocytes Relative: 11.1 % (ref 3.0–12.0)
Platelets: 120 10*3/uL — ABNORMAL LOW (ref 150.0–400.0)
RDW: 13.3 % (ref 11.5–14.6)
WBC: 6.8 10*3/uL (ref 4.5–10.5)

## 2011-09-04 LAB — APTT: aPTT: 27.9 s (ref 21.7–28.8)

## 2011-09-04 LAB — PROTIME-INR: INR: 1.1 ratio — ABNORMAL HIGH (ref 0.8–1.0)

## 2011-09-04 NOTE — Patient Instructions (Addendum)
We will schedule you for a right and left heart cath next week.   Continue your current medications.  Will need to be at heart and vascular center (where main entrance to hospital is-circ[address]); when turn in you will see a gated arm. Use CODE 0500). You will need to be there on Thursday October 25 at 9:30 AM. Nothing to eat or drink after midnight. May take your morning medications with sip of water. Someone needs to come with you that can drive and someone needs to stay with you that night. Your procedure will take approx 1 hr then you will lie flat for about 2 hrs.

## 2011-09-04 NOTE — Assessment & Plan Note (Signed)
He has anterior mitral valve prolapse with severe mitral insufficiency. This has progressed. He is symptomatic. He has evidence of pulmonary hypertension. I agree with recommendations for mitral valve repair. We will plan on a right and left heart catheterization and coronary angiography. This procedure was explained in detail to the patient including potential risks. These risk include but are not limited to, bleeding, vascular injury, myocardial infarction, stroke, dye reaction, or renal impairment. Patient understands and is agreeable to proceed. Once we have this data we can refer him for surgical evaluation.

## 2011-09-04 NOTE — Progress Notes (Signed)
[name] [name] Date of Birth:  Aug 19, 1931 [facility] [address]   [address] [place], [place]  [address] [phone]           Fax   [phone]  History of Present Illness: This pleasant 77 year old gentleman is seen at the request of Dr. Patty Sermons for cardiac catheterization. He has a history of mitral valve prolapse.  Recently he has noted increasing shortness of breath with activities such as climbing stairs and he was seen by Lawson Fiscal several weeks ago.  A brain natruretic peptide was noted to be elevated and an echocardiogram was obtained on 08/22/11, which showed that the echocardiogram now showed severe mitral regurgitation whereas previous echocardiogram on 10/02/09 had shown only mild regurgitation.  Also, his pulmonary artery pressure had increased from  4 2 mm mercury to 61 mm mercury.  The patient has not expressed any chest pain.  He has a past history of frequent PVCs related to his mitral valve prolapse and has been on long-term beta blocker. Right and left heart catheterization were recommended for evaluation for possible mitral valve repair.  Current Outpatient Prescriptions  Medication Sig Dispense Refill  . acetaminophen (TYLENOL) 500 MG tablet Take 500 mg by mouth every 6 (six) hours as needed.        Marland Kitchen aspirin 81 MG tablet Take 81 mg by mouth daily.        Marland Kitchen atorvastatin (LIPITOR) 10 MG tablet Take 1 tablet (10 mg total) by mouth daily.  90 tablet  3  . celecoxib (CELEBREX) 200 MG capsule Take 200 mg by mouth daily.        Marland Kitchen lisinopril (PRINIVIL) 10 MG tablet Take 1 tablet (10 mg total) by mouth daily.  30 tablet  5  . metoprolol (TOPROL-XL) 100 MG 24 hr tablet Take 1 tablet (100 mg total) by mouth daily.  90 tablet  3  . RESTASIS 0.05 % ophthalmic emulsion Place 1 drop into both eyes every 12 (twelve) hours.       . TRAZODONE HCL PO Take 300 mg by mouth at bedtime.          No Known Allergies  Patient Active Problem List  Diagnoses  .  TINEA PEDIS  . ADENOCARCINOMA, PROSTATE  . HYPERCHOLESTEROLEMIA  . THROMBOCYTOPENIA  . DEPRESSION  . HYPERTENSION, BENIGN ESSENTIAL  . MITRAL VALVE PROLAPSE  . GERD  . DERMATITIS, ATOPIC  . CALLUS, LEFT FOOT  . OSTEOARTHRITIS, GENERALIZED, MULTIPLE JOINTS  . BACK PAIN, CHRONIC  . MUSCLE SPASM, BACK  . CARDIAC MURMUR  . PERSONAL HISTORY MALIGNANT NEOPLASM PROSTATE  . PERSONAL HISTORY OF MALIGNANT MELANOMA OF SKIN  . ARRHYTHMIA, HX OF  . PERSONAL HISTORY OF COLONIC POLYPS  . Cerumen impaction  . Hearing loss  . Valvular heart disease    History  Smoking status  . Never Smoker   Smokeless tobacco  . Not on file    History  Alcohol Use No    Family History  Problem Relation Age of Onset  . Clotting disorder Brother     CVA's  . Arthritis Mother   . Hypertension Mother   . Hypertension Father   . Psychosis Father     psychiatric care  . Stroke Mother     Review of Systems: Constitutional: no fever chills diaphoresis or fatigue or change in weight.  Head and neck: no hearing loss, no epistaxis, no photophobia or visual disturbance. Respiratory: No cough, shortness of breath or wheezing. Cardiovascular:  No chest pain peripheral edema, palpitations. Gastrointestinal: No abdominal distention, no abdominal pain, no change in bowel habits hematochezia or melena. Genitourinary: No dysuria, no frequency, no urgency, no nocturia. Musculoskeletal:No arthralgias, no back pain, no gait disturbance or myalgias. Neurological: No dizziness, no headaches, no numbness, no seizures, no syncope, no weakness, no tremors. Hematologic: No lymphadenopathy, no easy bruising. Psychiatric: No confusion, no hallucinations, no sleep disturbance.    Physical Exam: Filed Vitals:   09/04/11 1431  BP: 143/85  Pulse: 93   general appearance reveals al thin gentleman in no acute distress.  Pupils equal and reactive.   Extraocular Movements are full.  There is no scleral icterus.  The mouth  and pharynx are normal.  The neck is supple.  The carotids reveal no bruits.  The jugular venous pressure is normal.  The thyroid is not enlarged.  There is no lymphadenopathy.  The chest is clear to percussion and auscultation. There are no rales or rhonchi. Expansion of the chest is symmetrical.  Heart reveals a grade 3/6 holosystolic murmur of mitral regurgitation at the apex.  This radiates throughout the precordium and into the back. No diastolic murmur.  No gallop or rub.The abdomen is soft and nontender. Bowel sounds are normal. The liver and spleen are not enlarged. There Are no abdominal masses. There are no bruits.  The pedal pulses are good.  There is no phlebitis or edema.  There is no cyanosis or clubbing.  The hands show narrowing fingers raising the question of possible Marfan's.Strength is normal and symmetrical in all extremities.  There is no lateralizing weakness.  There are no sensory deficits.  Strength is normal and symmetrical in all extremities.  There is no lateralizing weakness.  There are no sensory deficits.  The skin is warm and dry.  There is no rash.     Assessment / Plan:

## 2011-09-04 NOTE — Assessment & Plan Note (Signed)
He has had a low platelet count. His platelet count today has improved to 120,000. He is followed by Dr. Arline Asp for melanoma and we may get his opinion concerning his thrombocytopenia prior to surgery.

## 2011-09-08 ENCOUNTER — Telehealth: Payer: Self-pay | Admitting: *Deleted

## 2011-09-08 NOTE — Telephone Encounter (Signed)
Message copied by Lorayne Bender on Mon Sep 08, 2011  9:12 AM ------      Message from: [place], PETER M      Created: Thu Sep 04, 2011  4:55 PM       Platelet count is better. Other labs ok.      Theron Arista [place]

## 2011-09-08 NOTE — Telephone Encounter (Signed)
Notified of lab results. OK for cath 10/25.

## 2011-09-11 ENCOUNTER — Inpatient Hospital Stay (HOSPITAL_BASED_OUTPATIENT_CLINIC_OR_DEPARTMENT_OTHER)
Admission: RE | Admit: 2011-09-11 | Discharge: 2011-09-11 | Disposition: A | Discharge status: Home / Self Care | Payer: Medicare Other | Source: Ambulatory Visit | Attending: Cardiology | Admitting: Cardiology

## 2011-09-11 DIAGNOSIS — I739 Peripheral vascular disease, unspecified: Secondary | ICD-10-CM

## 2011-09-11 DIAGNOSIS — I059 Rheumatic mitral valve disease, unspecified: Secondary | ICD-10-CM

## 2011-09-11 DIAGNOSIS — I2789 Other specified pulmonary heart diseases: Secondary | ICD-10-CM | POA: Insufficient documentation

## 2011-09-11 LAB — POCT I-STAT 3, ART BLOOD GAS (G3+)
Acid-base deficit: 2 mmol/L (ref 0.0–2.0)
Bicarbonate: 22.8 mEq/L (ref 20.0–24.0)

## 2011-09-11 LAB — POCT I-STAT 3, VENOUS BLOOD GAS (G3P V)
Acid-base deficit: 3 mmol/L — ABNORMAL HIGH (ref 0.0–2.0)
pO2, Ven: 36 mmHg (ref 30.0–45.0)

## 2011-09-15 ENCOUNTER — Encounter: Payer: Self-pay | Admitting: Thoracic Surgery (Cardiothoracic Vascular Surgery)

## 2011-09-15 ENCOUNTER — Institutional Professional Consult (permissible substitution) (INDEPENDENT_AMBULATORY_CARE_PROVIDER_SITE_OTHER): Payer: Medicare Other | Admitting: Thoracic Surgery (Cardiothoracic Vascular Surgery)

## 2011-09-15 VITALS — BP 127/76 | HR 75 | Resp 16 | Ht 77.0 in | Wt 202.0 lb

## 2011-09-15 DIAGNOSIS — I059 Rheumatic mitral valve disease, unspecified: Secondary | ICD-10-CM

## 2011-09-15 MED ORDER — AMIODARONE HCL 200 MG PO TABS: 200.0000 mg | ORAL_TABLET | Freq: Two times a day (BID) | ORAL | Status: DC

## 2011-09-15 NOTE — Progress Notes (Signed)
PCP is Thomos Lemons, DO, DO Referring Provider is [place], Peter, MD  Chief Complaint  Patient presents with  . 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 For years he has been followed here locally by Dr. Patty Sermons. The patient reports that he was in his usual state of health until approximately 3 weeks ago when he developed new onset of exertional shortness of breath. An echocardiogram was performed 08/22/2011 demonstrating severe mitral regurgitation. Previous echocardiogram had demonstrated only mild mitral regurgitation. The patient subsequently was scheduled for elective left and right heart catheterization. This was performed October 25 and notable for the presence of normal coronary artery anatomy with no significant coronary artery disease. There was normal left ventricular systolic function with severe mitral regurgitation and moderate pulmonary hypertension. The patient has been referred for possible elective mitral valve repair.   Past Medical History  Diagnosis Date  . Prostate cancer   . Melanoma     Left Shoulder  . Vision abnormalities     Cornea scarring  . Osteoarthritis     Knees  . Hypertension   . Pure hypercholesterolemia   . Esophageal reflux   . Depressive disorder, not elsewhere classified   . MVP (mitral valve prolapse)   . Personal history of colonic polyps   . Valvular heart disease     Last echo in 2010 with EF 55 to 60%, +MVP, mild MR, diastolic dysfunction, biatrial enlargement, mild AI and mild pulmonary HTN  . Normal nuclear stress test 2007  . Thrombocytopenia   . Mitral regurgitation     Past Surgical History  Procedure Date  . Nuclear stress test 09/2006    EF-64%, Normal  . US echocardiography 09/2009    mild LVH,mild AI,MVP with mild MR, mild-mod. TR with mild Pulm. HTN, EF-55-60%  . Inguinal hernia repair 09/2009      Left  . Knee arthroscopy     x 2  . Colonoscopy w/ polypectomy   . Prostatectomy 2003  . Rotator cuff repair 2003    left  . Melanoma surgery     2001, 2005, 2006, 2009    Family History  Problem Relation Age of Onset  . Clotting disorder Brother     CVA's  . Arthritis Mother   . Hypertension Mother   . Hypertension Father   . Psychosis Father     psychiatric care  . Stroke Mother     Social History History  Substance Use Topics  . Smoking status: Never Smoker   . Smokeless tobacco: Not on file  . Alcohol Use: No    Current Outpatient Prescriptions  Medication Sig Dispense Refill  . acetaminophen (TYLENOL) 500 MG tablet Take 500 mg by mouth every 6 (six) hours as needed.        Marland Kitchen aspirin 81 MG tablet Take 81 mg by mouth daily.        Marland Kitchen atorvastatin (LIPITOR) 10 MG tablet Take 1 tablet (10 mg total) by mouth daily.  90 tablet  3  . celecoxib (CELEBREX) 200 MG capsule Take 200 mg by mouth daily.        Marland Kitchen lisinopril (PRINIVIL) 10 MG tablet Take 1 tablet (10 mg total) by mouth daily.  30 tablet  5  . metoprolol (TOPROL-XL) 100 MG 24 hr tablet Take 1 tablet (100 mg total) by mouth daily.  90 tablet  3  . RESTASIS 0.05 % ophthalmic emulsion Place 1 drop into both eyes every 12 (twelve) hours.       . TRAZODONE HCL PO Take 300 mg by mouth at bedtime.          No Known Allergies  Review of Systems  Constitutional: Negative for fever, chills, activity change, appetite change, fatigue and unexpected weight change.  HENT: Negative.   Eyes:       Some chronic double vision and blurry vision left eye  Respiratory: Positive for shortness of breath. Negative for cough, choking, chest tightness and wheezing.   Cardiovascular: Positive for palpitations and leg swelling. Negative for chest pain.       Recent onset exertional shortness of breath and orthopnea.  Shortness of breath occurs with moderate activity.  No resting shortness of breath.  Gastrointestinal:        Occasional constipation  Genitourinary: Positive for frequency.  Musculoskeletal: Positive for arthralgias.       Chronic pain and decreased mobility in both knees  Neurological: Positive for numbness.       Chronic numbness and mild pain in both feet related to peripheral neuropathy  Hematological:       Recent mild thrombocytopenia without any history of bleeding  Psychiatric/Behavioral:       Depression    BP 127/76  Pulse 75  Resp 16  Ht 6\' 5"  (1.956 m)  Wt 202 lb (91.627 kg)  BMI 23.95 kg/m2  SpO2 97% Physical Exam  Vitals reviewed. Constitutional: He is oriented to person, place, and time. He appears well-developed and well-nourished.  HENT:  Head: Normocephalic.  Eyes: Pupils are equal, round, and reactive to light.  Neck: Normal range of motion. Neck supple. No JVD present. No thyromegaly present.  Cardiovascular: Normal rate and regular rhythm.   Murmur heard.      Loud grade IV/VI holosystolic murmur heard all across the precordium  Pulmonary/Chest: Effort normal and breath sounds normal. He has no wheezes. He has no rales.  Abdominal: Bowel sounds are normal. He exhibits no mass. There is no tenderness.  Musculoskeletal: He exhibits edema. He exhibits no tenderness.  Lymphadenopathy:    He has no cervical adenopathy.  Neurological: He is alert and oriented to person, place, and time.  Skin: Skin is warm and dry.  Psychiatric: He has a normal mood and affect. His behavior is normal. Judgment and thought content normal.     Diagnostic Tests:  Transthoracic echocardiogram performed 08/21/2011 is reviewed. This demonstrates severe mitral regurgitation. The jet of regurgitation is eccentric and directed posteriorly. There is some thickening of the mitral valve leaflets. The posterior leaflet is not well visualized. It is difficult to tell for certain the functional anatomy, but based upon the jet of regurgitation I would suspect the presence of possible flail segment  of the anterior leaflet. Nevertheless, the functional anatomy is not clearly identified. Left ventricular systolic function is normal. There is at least moderate tricuspid regurgitation. There is mild aortic insufficiency. Transesophageal echocardiogram would be helpful to better ascertain the functional anatomy associated with the patient's severe mitral regurgitation.  Left and right heart catheterization performed 09/11/2011 is reviewed. This demonstrates normal coronary artery anatomy with no significant coronary artery disease. The descending thoracic and abdominal aorta and iliac vessels are free of any significant aortoiliac disease. There is moderate pulmonary hypertension with PA pressures measured 47/18 with mean wedge pressure of 23 and a large V waves consistent with severe mitral  regurgitation. Cardiac output was 5.8 L per minute corresponding to a cardiac index of 2.7.   Impression:  The patient has severe mitral regurgitation with recent onset of symptoms of exertional shortness of breath and orthopnea. He has moderate pulmonary hypertension with normal left ventricular systolic function. There is no coronary artery disease. I agree that mitral valve repair is indicated. Images from recent transthoracic echocardiogram are not clear enough to definitively establish a plan for repair, and as such the likelihood of repair would be difficult to ascertain at this time. Transesophageal echocardiogram would likely be helpful. The patient has mild thrombocytopenia but platelet counts have been stable. The patient otherwise appears to be a relatively good candidate for surgical intervention and should be a good candidate for use of the minimally invasive approach for surgery.   Plan:  I have discussed matters at length with the patient and his daughter here in the office today.  The rationale for elective mitral valve repair surgery has been explained, including a comparison between surgery and  continued medical therapy with close follow-up.  The likelihood of successful and durable valve repair has been discussed with particular reference to the findings of their recent echocardiogram. I explained to them the reason why transesophageal echocardiogram might be helpful to better establish a plan for valve repair and give them a better idea the likelihood that his valve should be repairable with an excellent durable result.  In the unlikely event that their valve cannot be successfully repaired, we discussed the possibility of replacing the mitral valve using a mechanical prosthesis with the attendant need for long-term anticoagulation versus the alternative of replacing it using a bioprosthetic tissue valve with its potential for late structural valve deterioration and failure, depending upon the patient's longevity.  If the patient's mitral valve cannot be successfully repaired, he seems inclined to wish to have his valve replaced using a bioprosthetic tissue valve in effort to avoid the need for anticoagulation. Alternative surgical approaches have been discussed, including a comparison between conventional sternotomy and minimally-invasive techniques.  The relative risks and benefits of each have been reviewed as they pertain to the patient's specific circumstances, and all of their questions have been addressed.  We will make arrangements for the patient to have transesophageal echocardiogram at some point within the next week or so. We will tentatively plan to proceed with elective mitral valve repair on Wednesday, November 14. The patient will return on Monday, November 12 to review his echo and make final plans for surgery.  The patient has been given a prescription for amiodarone to begin one week prior to surgery to decrease his risk of perioperative arrhythmias. All of his questions been addressed.

## 2011-09-15 NOTE — Patient Instructions (Signed)
Stop taking aspirin. Start taking amiodarone one week prior to surgery and stop taking trazodone at that time.

## 2011-09-16 ENCOUNTER — Telehealth: Payer: Self-pay | Admitting: Cardiology

## 2011-09-16 ENCOUNTER — Other Ambulatory Visit: Payer: Self-pay | Admitting: Thoracic Surgery (Cardiothoracic Vascular Surgery)

## 2011-09-16 DIAGNOSIS — I059 Rheumatic mitral valve disease, unspecified: Secondary | ICD-10-CM

## 2011-09-16 NOTE — Telephone Encounter (Signed)
Per Revonda Standard pt need to have TEE by Dr. [place] / Dr. Patty Sermons as soon as poss bile. Note are in epic.

## 2011-09-16 NOTE — Telephone Encounter (Signed)
Revonda Standard, nurse with Dr Barry Dienes, is calling to request that pt be scheduled for a TEE as soon as possible.  They have the pt scheduled for a MV repair on 10/01/11 (mini thoracotomy) and need the TEE prior to that.  Pt's primary cardiologist is Dr Patty Sermons but he was referred to Dr Barry Dienes by Dr [place].

## 2011-09-18 ENCOUNTER — Encounter (HOSPITAL_COMMUNITY): Payer: Self-pay | Admitting: Respiratory Therapy

## 2011-09-18 HISTORY — PX: CARDIAC CATHETERIZATION: SHX172

## 2011-09-22 NOTE — Telephone Encounter (Signed)
This has been scheduled and patient advised by Nathanial Millman RN

## 2011-09-24 ENCOUNTER — Telehealth: Payer: Self-pay | Admitting: Cardiology

## 2011-09-24 NOTE — Telephone Encounter (Signed)
Pt has tee procedure scheduled and he has some questions

## 2011-09-24 NOTE — Telephone Encounter (Signed)
Called wanting to verify if he could eat prior to TEE on Friday. Advised that he could not eat or drink after midnight. Also wanted to know where he needed to go when he got to hospital. Explained whole procedure again. Verbalizes understanding.

## 2011-09-24 NOTE — Cardiovascular Report (Signed)
  NAMEANGUS, [name] [name] NO.:  [id number]  MEDICAL RECORD NO.:  [id number]  LOCATION:                                 FACILITY:  PHYSICIAN:  Kenidy Crossland M. [place], M.D.  DATE OF BIRTH:  [birth year]  DATE OF PROCEDURE:  09/11/2011 DATE OF DISCHARGE:                           CARDIAC CATHETERIZATION   INDICATION FOR PROCEDURE:  This is a 77 year old white male who has developed symptomatic severe mitral valve regurgitation related to mitral valve prolapse.  Cardiac catheterization was indicated for pre- surgical evaluation.  PROCEDURES:  Right and left heart catheterization.  Coronary and left ventricular angiography and abdominal aortography.  ACCESS:  Via the left femoral artery and vein using standard Seldinger technique.  EQUIPMENT:  A 4-French 4-cm left Judkins catheter, 4-French 3DRC catheter, 4-French Pigtail catheter, 4-French arterial sheath, 7-French venous sheath, 7-French balloon tip Swan-Ganz catheter.  MEDICATIONS:  Local anesthesia 1% Xylocaine.  CONTRAST:  140 mL of Omnipaque.  HEMODYNAMIC DATA:  Right atrial pressure is 11/10 with a mean of 8 mmHg. Right ventricular pressure is 45 with EDP of 11 mmHg.  Pulmonary artery pressure is 47/18 with a mean of 32 mmHg.  Pulmonary capillary wedge pressure is 24/41 with a mean of 23 mmHg.  V-waves are 20 mm.  Aortic pressure is 106/63 with a mean of 81 mmHg.  Left ventricle pressure is 108 with EDP of 25 mmHg.  Cardiac output by Fick is 5.8 L/minute with an index of 2.7.  ANGIOGRAPHIC DATA:  The left coronary artery arises and distributes normally.  The left main coronary artery is normal.  Left anterior descending artery is normal.  Left circumflex coronary is normal.  The right coronary artery arises and distributes normally.  It is normal.  The left ventricular angiography was performed in the RAO view.  This demonstrates normal left ventricular size and contractility with normal ejection  fraction of 60%.  There was severe mitral insufficiency with free reflux into the pulmonary veins.  Abdominal aortography was performed.  This demonstrates normal abdominal aorta.  The renal arteries were normal.  The right iliac and femoral artery were normal.  FINAL ASSESSMENT: 1. Normal coronary anatomy. 2. Normal left ventricular function. 3. Severe mitral insufficiency. 4. Mild-to-moderate pulmonary hypertension.  PLAN:  I would recommend consideration for mitral valve repair.          ______________________________ [name] [name] M. [place], M.D.     PMJ/MEDQ  D:  09/11/2011  T:  09/11/2011  Job:  [id number]  cc:   Cassell Clement, M.D.  Electronically Signed by [name] [name] [place] M.D. on 09/24/2011 04:47:57 PM

## 2011-09-26 ENCOUNTER — Ambulatory Visit (HOSPITAL_COMMUNITY)
Admission: RE | Admit: 2011-09-26 | Discharge: 2011-09-26 | Disposition: A | Discharge status: Home / Self Care | Payer: Medicare Other | Source: Ambulatory Visit | Attending: Cardiology | Admitting: Cardiology

## 2011-09-26 ENCOUNTER — Other Ambulatory Visit: Payer: Self-pay

## 2011-09-26 ENCOUNTER — Encounter (HOSPITAL_COMMUNITY)
Admission: RE | Disposition: A | Discharge status: Home / Self Care | Payer: Self-pay | Source: Ambulatory Visit | Attending: Cardiology

## 2011-09-26 DIAGNOSIS — I319 Disease of pericardium, unspecified: Secondary | ICD-10-CM | POA: Insufficient documentation

## 2011-09-26 DIAGNOSIS — I059 Rheumatic mitral valve disease, unspecified: Secondary | ICD-10-CM

## 2011-09-26 DIAGNOSIS — I079 Rheumatic tricuspid valve disease, unspecified: Secondary | ICD-10-CM | POA: Insufficient documentation

## 2011-09-26 HISTORY — PX: TEE WITHOUT CARDIOVERSION: SHX5443

## 2011-09-26 SURGERY — ECHOCARDIOGRAM, TRANSESOPHAGEAL
Anesthesia: Moderate Sedation

## 2011-09-26 MED ORDER — SODIUM CHLORIDE 0.9 % IV SOLN: Freq: Once | INTRAVENOUS | Status: DC

## 2011-09-26 MED ORDER — FENTANYL CITRATE 0.05 MG/ML IJ SOLN
INTRAMUSCULAR | Status: DC | PRN
  Administered 2011-09-26 (×2): 25 ug via INTRAVENOUS

## 2011-09-26 MED ORDER — MIDAZOLAM HCL 10 MG/2ML IJ SOLN
INTRAMUSCULAR | Status: AC
  Filled 2011-09-26: qty 2

## 2011-09-26 MED ORDER — FENTANYL CITRATE 0.05 MG/ML IJ SOLN
INTRAMUSCULAR | Status: AC
  Filled 2011-09-26: qty 2

## 2011-09-26 MED ORDER — MIDAZOLAM HCL 10 MG/2ML IJ SOLN
INTRAMUSCULAR | Status: DC | PRN
  Administered 2011-09-26 (×2): 2 mg via INTRAVENOUS

## 2011-09-26 MED ORDER — BUTAMBEN-TETRACAINE-BENZOCAINE 2-2-14 % EX AERO
INHALATION_SPRAY | CUTANEOUS | Status: DC | PRN
  Administered 2011-09-26: 2 via TOPICAL

## 2011-09-26 NOTE — H&P (Signed)
See H and P note 09/04/11 [name] [name]

## 2011-09-26 NOTE — Brief Op Note (Signed)
See report in echo system [name] [name]  

## 2011-09-29 ENCOUNTER — Encounter (HOSPITAL_COMMUNITY): Payer: Self-pay

## 2011-09-29 ENCOUNTER — Ambulatory Visit (HOSPITAL_COMMUNITY)
Admission: RE | Admit: 2011-09-29 | Discharge: 2011-09-29 | Disposition: A | Discharge status: Home / Self Care | Payer: Medicare Other | Source: Ambulatory Visit | Attending: Thoracic Surgery (Cardiothoracic Vascular Surgery) | Admitting: Thoracic Surgery (Cardiothoracic Vascular Surgery)

## 2011-09-29 ENCOUNTER — Ambulatory Visit (INDEPENDENT_AMBULATORY_CARE_PROVIDER_SITE_OTHER): Payer: Medicare Other | Admitting: Thoracic Surgery (Cardiothoracic Vascular Surgery)

## 2011-09-29 ENCOUNTER — Other Ambulatory Visit: Payer: Self-pay

## 2011-09-29 ENCOUNTER — Encounter: Payer: Self-pay | Admitting: Thoracic Surgery (Cardiothoracic Vascular Surgery)

## 2011-09-29 ENCOUNTER — Encounter (HOSPITAL_COMMUNITY)
Admission: RE | Admit: 2011-09-29 | Discharge: 2011-09-29 | Disposition: A | Discharge status: Home / Self Care | Payer: Medicare Other | Source: Ambulatory Visit | Attending: Thoracic Surgery (Cardiothoracic Vascular Surgery) | Admitting: Thoracic Surgery (Cardiothoracic Vascular Surgery)

## 2011-09-29 ENCOUNTER — Other Ambulatory Visit (HOSPITAL_COMMUNITY): Payer: Self-pay | Admitting: Respiratory Therapy

## 2011-09-29 VITALS — BP 128/71 | HR 74 | Resp 16 | Ht 77.0 in | Wt 202.0 lb

## 2011-09-29 DIAGNOSIS — R0602 Shortness of breath: Secondary | ICD-10-CM | POA: Insufficient documentation

## 2011-09-29 DIAGNOSIS — I059 Rheumatic mitral valve disease, unspecified: Secondary | ICD-10-CM

## 2011-09-29 DIAGNOSIS — I517 Cardiomegaly: Secondary | ICD-10-CM | POA: Insufficient documentation

## 2011-09-29 DIAGNOSIS — Z01810 Encounter for preprocedural cardiovascular examination: Secondary | ICD-10-CM

## 2011-09-29 DIAGNOSIS — I2510 Atherosclerotic heart disease of native coronary artery without angina pectoris: Secondary | ICD-10-CM

## 2011-09-29 DIAGNOSIS — Z01812 Encounter for preprocedural laboratory examination: Secondary | ICD-10-CM | POA: Insufficient documentation

## 2011-09-29 HISTORY — DX: Ventricular premature depolarization: I49.3

## 2011-09-29 HISTORY — DX: Polyneuropathy, unspecified: G62.9

## 2011-09-29 LAB — CBC
HCT: 41.4 % (ref 39.0–52.0)
Hemoglobin: 14.1 g/dL (ref 13.0–17.0)
MCH: 31.4 pg (ref 26.0–34.0)
MCV: 92.2 fL (ref 78.0–100.0)
Platelets: 101 10*3/uL — ABNORMAL LOW (ref 150–400)
RBC: 4.49 MIL/uL (ref 4.22–5.81)

## 2011-09-29 LAB — COMPREHENSIVE METABOLIC PANEL
BUN: 26 mg/dL — ABNORMAL HIGH (ref 6–23)
CO2: 23 mEq/L (ref 19–32)
Calcium: 9.9 mg/dL (ref 8.4–10.5)
Chloride: 104 mEq/L (ref 96–112)
Creatinine, Ser: 1.15 mg/dL (ref 0.50–1.35)
GFR calc Af Amer: 68 mL/min — ABNORMAL LOW (ref >90)
GFR calc non Af Amer: 59 mL/min — ABNORMAL LOW (ref >90)
Glucose, Bld: 111 mg/dL — ABNORMAL HIGH (ref 70–99)
Total Bilirubin: 1 mg/dL (ref 0.3–1.2)

## 2011-09-29 LAB — URINALYSIS, ROUTINE W REFLEX MICROSCOPIC
Bilirubin Urine: NEGATIVE
Glucose, UA: NEGATIVE mg/dL
Leukocytes, UA: NEGATIVE
Nitrite: NEGATIVE
Specific Gravity, Urine: 1.008 (ref 1.005–1.030)
pH: 6.5 (ref 5.0–8.0)

## 2011-09-29 LAB — URINE MICROSCOPIC-ADD ON

## 2011-09-29 LAB — SURGICAL PCR SCREEN: Staphylococcus aureus: NEGATIVE

## 2011-09-29 LAB — BLOOD GAS, ARTERIAL
Acid-base deficit: 0.1 mmol/L (ref 0.0–2.0)
Drawn by: 344381
O2 Saturation: 95.4 %
TCO2: 25.1 mmol/L (ref 0–100)
pO2, Arterial: 76.8 mmHg — ABNORMAL LOW (ref 80.0–100.0)

## 2011-09-29 LAB — HEMOGLOBIN A1C
Hgb A1c MFr Bld: 5.6 % (ref <5.7)
Mean Plasma Glucose: 114 mg/dL (ref <117)

## 2011-09-29 LAB — APTT: aPTT: 28 seconds (ref 24–37)

## 2011-09-29 LAB — ABO/RH: ABO/RH(D): A POS

## 2011-09-29 LAB — PROTIME-INR: Prothrombin Time: 14.8 seconds (ref 11.6–15.2)

## 2011-09-29 MED ORDER — ALBUTEROL SULFATE (5 MG/ML) 0.5% IN NEBU
2.5000 mg | INHALATION_SOLUTION | Freq: Once | RESPIRATORY_TRACT | Status: AC
  Administered 2011-09-29: 2.5 mg via RESPIRATORY_TRACT

## 2011-09-29 NOTE — Progress Notes (Signed)
*  PRELIMINARY RESULTS*  Pre CABG Dopplers has been performed. No significant ICA stenosis and antegrade vertebral flow bilaterally. Right brachial pressure is 123 mmHg and left brachial pressure is 128 mmHg. Bilateral palmar arch flow remains normal with radial and ulnar compression. Bilateral brachial, radial, and ulnar artery Doppler shows triphasic flow.  [name] [name] 09/29/2011, 10:53 AM

## 2011-09-29 NOTE — Pre-Procedure Instructions (Addendum)
20 [name] [name]  09/29/2011   Your procedure is scheduled on:  November 14  Report to [facility] at 6:30 AM.  Call this number if you have problems the morning of surgery: [phone]   Remember:   Do not eat food:After Midnight.  Do not drink clear liquids: 4 Hours before arrival.  Take these medicines the morning of surgery with A SIP OF WATER: amiodarone, metoprolol  Stop taking Celeberex   Do not wear jewelry, make-up or nail polish.  Do not wear lotions, powders, or perfumes. You may wear deodorant.  Do not shave 48 hours prior to surgery.  Do not bring valuables to the hospital.  Contacts, dentures or bridgework may not be worn into surgery.  Leave suitcase in the car. After surgery it may be brought to your room.  For patients admitted to the hospital, checkout time is 11:00 AM the day of discharge.   Patients discharged the day of surgery will not be allowed to drive home.  Name and phone number of your driver: N/A - admitted after surgery  Special Instructions: Incentive Spirometry - Practice and bring it with you on the day of surgery. and CHG Shower Use Special Wash: 1/2 bottle night before surgery and 1/2 bottle morning of surgery.   Please read over the following fact sheets that you were given: Pain Booklet, Coughing and Deep Breathing, Blood Transfusion Information, Open Heart Packet and Surgical Site Infection Prevention

## 2011-09-29 NOTE — Progress Notes (Signed)
Patient returns to the office today for further followup of mitral regurgitation. A full consultation note as been dictated previously. He underwent transesophageal echocardiogram last week by Dr. Olga Millers. This confirmed the presence of severe mitral regurgitation. There was type II dysfunction with obvious flail segment of the middle scallop of the anterior leaflet of the mitral valve. The posterior leaflet appears relatively small and possibly restricted. The jet of regurgitation coursing posteriorly around the left atrium. There was moderate tricuspid regurgitation. No other significant abnormalities were noted.  I reviewed the transesophageal echocardiogram images with the patient and his daughter here in the office today. I feel there remains a relatively high likelihood that his valve can be repaired, but the repair may be quite complex and the duration of surgery could be long. If satisfactory repair cannot be achieved, the patient specifically requests that we replace his valve using a bioprosthetic tissue valve. He understands that associated with this there would be some risk of late structural valve deterioration in failure.  Alternative surgical approaches have been discussed, including a comparison between conventional sternotomy and minimally-invasive techniques.  The relative risks and benefits of each have been reviewed as they pertain to the patient's specific circumstances, and all of their questions have been addressed.  The patient and his daughter were counseled at length regarding the indications, risks and potential benefits of surgery.  Alternative treatment strategies were discussed. They understand and accept all potential risks of surgery including but not limited to risk of death, stroke or other neurologic complication, myocardial infarction, congestive heart failure, respiratory failure, renal failure, bleeding requiring transfusion and/or reexploration, arrhythmia,  infection or other wound complications, pneumonia, pleural and/or pericardial effusion, pulmonary embolus, aortic dissection or other major vascular complication, or delayed complications related to valve repair or replacement.  All of their questions have been answered.  We spent in excess of 30 minutes during consultation.

## 2011-09-29 NOTE — H&P (Signed)
[name] Waddington H, MD  09/15/2011  6:02 PM  Signed  PCP is Robert Yoo, DO, DO Referring Provider is Jordan, Peter, MD    Chief Complaint   Patient presents with   .  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 For years he has been followed here locally by Dr. Brackbill. The patient reports that he was in his usual state of health until approximately 3 weeks ago when he developed new onset of exertional shortness of breath. An echocardiogram was performed 08/22/2011 demonstrating severe mitral regurgitation. Previous echocardiogram had demonstrated only mild mitral regurgitation. The patient subsequently was scheduled for elective left and right heart catheterization. This was performed October 25 and notable for the presence of normal coronary artery anatomy with no significant coronary artery disease. There was normal left ventricular systolic function with severe mitral regurgitation and moderate pulmonary hypertension. The patient has been referred for possible elective mitral valve repair.     Past Medical History   Diagnosis  Date   .  Prostate cancer     .  Melanoma         Left Shoulder   .  Vision abnormalities         Cornea scarring   .  Osteoarthritis         Knees   .  Hypertension     .  Pure hypercholesterolemia     .  Esophageal reflux     .  Depressive disorder, not elsewhere classified     .  MVP (mitral valve prolapse)     .  Personal history of colonic polyps     .  Valvular heart disease         Last echo in 2010 with EF 55 to 60%, +MVP, mild MR, diastolic dysfunction, biatrial enlargement, mild AI and mild pulmonary HTN   .  Normal nuclear stress test  2007   .  Thrombocytopenia     .  Mitral regurgitation         Past Surgical History   Procedure  Date   .  Nuclear stress test  09/2006       EF-64%, Normal   .  Us  echocardiography  09/2009       mild LVH,mild AI,MVP with mild MR, mild-mod. TR with mild Pulm. HTN, EF-55-60%   .  Inguinal hernia repair  09/2009       Left   .  Knee arthroscopy         x 2   .  Colonoscopy w/ polypectomy     .  Prostatectomy  2003   .  Rotator cuff repair  2003       left   .  Melanoma surgery         2001, 2005, 2006, 2009       Family History   Problem  Relation  Age of Onset   .  Clotting disorder  Brother         CVA's   .  Arthritis  Mother     .  Hypertension  Mother     .  Hypertension  Father     .  Psychosis  Father         psychiatric care   .  Stroke  Mother         Social History History   Substance Use Topics   .  Smoking status:  Never Smoker    .  Smokeless tobacco:  Not on file   .  Alcohol Use:  No       Current Outpatient Prescriptions   Medication  Sig  Dispense  Refill   .  acetaminophen (TYLENOL) 500 MG tablet  Take 500 mg by mouth every 6 (six) hours as needed.           .  aspirin 81 MG tablet  Take 81 mg by mouth daily.           .  atorvastatin (LIPITOR) 10 MG tablet  Take 1 tablet (10 mg total) by mouth daily.   90 tablet   3   .  celecoxib (CELEBREX) 200 MG capsule  Take 200 mg by mouth daily.           .  lisinopril (PRINIVIL) 10 MG tablet  Take 1 tablet (10 mg total) by mouth daily.   30 tablet   5   .  metoprolol (TOPROL-XL) 100 MG 24 hr tablet  Take 1 tablet (100 mg total) by mouth daily.   90 tablet   3   .  RESTASIS 0.05 % ophthalmic emulsion  Place 1 drop into both eyes every 12 (twelve) hours.          .  TRAZODONE HCL PO  Take 300 mg by mouth at bedtime.             No Known Allergies  Review of Systems  Constitutional: Negative for fever, chills, activity change, appetite change, fatigue and unexpected weight change.  HENT: Negative.   Eyes:        Some chronic double vision and blurry vision left eye  Respiratory: Positive for shortness of breath. Negative for cough, choking, chest tightness and wheezing.     Cardiovascular: Positive for palpitations and leg swelling. Negative for chest pain.        Recent onset exertional shortness of breath and orthopnea.  Shortness of breath occurs with moderate activity.  No resting shortness of breath.  Gastrointestinal:       Occasional constipation  Genitourinary: Positive for frequency.  Musculoskeletal: Positive for arthralgias.        Chronic pain and decreased mobility in both knees  Neurological: Positive for numbness.        Chronic numbness and mild pain in both feet related to peripheral neuropathy  Hematological:       Recent mild thrombocytopenia without any history of bleeding  Psychiatric/Behavioral:        Depression    BP 127/76  Pulse 75  Resp 16  Ht 6' 5" (1.956 m)  Wt 202 lb (91.627 kg)  BMI 23.95 kg/m2  SpO2 97% Physical Exam  Vitals reviewed. Constitutional: He is oriented to person, place, and time. He appears well-developed and well-nourished.  HENT:   Head: Normocephalic.  Eyes: Pupils are equal, round, and reactive to light.  Neck: Normal range of motion. Neck supple. No JVD present. No thyromegaly present.  Cardiovascular: Normal rate and regular rhythm.    Murmur heard.      Loud grade IV/VI holosystolic murmur heard all across the precordium  Pulmonary/Chest: Effort normal and breath sounds normal. He has no wheezes. He has no rales.  Abdominal: Bowel sounds are normal. He exhibits no mass. There is no tenderness.  Musculoskeletal: He exhibits edema. He exhibits no tenderness.  Lymphadenopathy:      He has no cervical adenopathy.  Neurological: He is alert and oriented to person, place, and time.  Skin: Skin is warm and dry.  Psychiatric: He has a normal mood and affect. His behavior is normal. Judgment and thought content normal.     Diagnostic Tests:  Transthoracic echocardiogram performed 08/21/2011 is reviewed. This demonstrates severe mitral regurgitation. The jet of regurgitation is eccentric and directed  posteriorly. There is some thickening of the mitral valve leaflets. The posterior leaflet is not well visualized. It is difficult to tell for certain the functional anatomy, but based upon the jet of regurgitation I would suspect the presence of possible flail segment of the anterior leaflet. Nevertheless, the functional anatomy is not clearly identified. Left ventricular systolic function is normal. There is at least moderate tricuspid regurgitation. There is mild aortic insufficiency. Transesophageal echocardiogram would be helpful to better ascertain the functional anatomy associated with the patient's severe mitral regurgitation.  Left and right heart catheterization performed 09/11/2011 is reviewed. This demonstrates normal coronary artery anatomy with no significant coronary artery disease. The descending thoracic and abdominal aorta and iliac vessels are free of any significant aortoiliac disease. There is moderate pulmonary hypertension with PA pressures measured 47/18 with mean wedge pressure of 23 and a large V waves consistent with severe mitral regurgitation. Cardiac output was 5.8 L per minute corresponding to a cardiac index of 2.7.  Transesophageal echocardiogram was performed last week by Dr. Brian Crenshaw. This confirmed the presence of severe mitral regurgitation. There was type II dysfunction with obvious flail segment of the middle scallop of the anterior leaflet of the mitral valve. The posterior leaflet appears relatively small and possibly restricted. The jet of regurgitation coursing posteriorly around the left atrium. There was moderate tricuspid regurgitation. No other significant abnormalities were noted.    Impression:  The patient has severe mitral regurgitation with recent onset of symptoms of exertional shortness of breath and orthopnea. He has moderate pulmonary hypertension with normal left ventricular systolic function. There is no coronary artery disease. I agree that  mitral valve repair is indicated. The patient has mild thrombocytopenia but platelet counts have been stable. The patient otherwise appears to be a relatively good candidate for surgical intervention and should be a good candidate for use of the minimally invasive approach for surgery.   Plan:  I have discussed matters at length with the patient and his daughter here in the office today.  The rationale for elective mitral valve repair surgery has been explained, including a comparison between surgery and continued medical therapy with close follow-up.  The likelihood of successful and durable valve repair has been discussed with particular reference to the findings of their recent echocardiogram. I explained to them the reason why transesophageal echocardiogram might be helpful to better establish a plan for valve repair and give them a better idea the likelihood that his valve should be repairable with an excellent durable result.  In the unlikely event that their valve cannot be successfully repaired, we discussed the possibility of replacing the mitral valve using a mechanical prosthesis with the attendant need for long-term anticoagulation versus the alternative of replacing it using a bioprosthetic tissue valve with its potential for late structural valve deterioration and failure, depending upon the patient's longevity.  If the patient's mitral valve cannot be successfully repaired, he seems inclined to wish to have his valve replaced using a bioprosthetic tissue valve in effort to avoid the need for anticoagulation. Alternative surgical approaches have been discussed, including   a comparison between conventional sternotomy and minimally-invasive techniques.  The relative risks and benefits of each have been reviewed as they pertain to the patient's specific circumstances, and all of their questions have been addressed.   Alternative surgical approaches have been discussed, including a comparison between  conventional sternotomy and minimally-invasive techniques.  The relative risks and benefits of each have been reviewed as they pertain to the patient's specific circumstances, and all of their questions have been addressed.  The patient and his daughter were counseled at length regarding the indications, risks and potential benefits of surgery.  Alternative treatment strategies were discussed. They understand and accept all potential risks of surgery including but not limited to risk of death, stroke or other neurologic complication, myocardial infarction, congestive heart failure, respiratory failure, renal failure, bleeding requiring transfusion and/or reexploration, arrhythmia, infection or other wound complications, pneumonia, pleural and/or pericardial effusion, pulmonary embolus, aortic dissection or other major vascular complication, or delayed complications related to valve repair or replacement.  All of their questions have been answered.  We spent in excess of 30 minutes during consultation.     

## 2011-09-29 NOTE — Patient Instructions (Signed)
For OR this Wednesday

## 2011-09-30 MED ORDER — SODIUM CHLORIDE 0.9 % IV SOLN
0.1000 ug/kg/h | INTRAVENOUS | Status: DC
  Filled 2011-09-30 (×2): qty 4

## 2011-09-30 MED ORDER — CEFUROXIME SODIUM 750 MG IJ SOLR
750.0000 mg | INTRAMUSCULAR | Status: DC
  Filled 2011-09-30: qty 750

## 2011-09-30 MED ORDER — SODIUM CHLORIDE 0.9 % IV SOLN
INTRAVENOUS | Status: DC
  Filled 2011-09-30: qty 1

## 2011-09-30 MED ORDER — POTASSIUM CHLORIDE 2 MEQ/ML IV SOLN
80.0000 meq | INTRAVENOUS | Status: DC
  Filled 2011-09-30: qty 40

## 2011-09-30 MED ORDER — PHENYLEPHRINE HCL 10 MG/ML IJ SOLN
30.0000 ug/min | INTRAVENOUS | Status: DC
  Filled 2011-09-30: qty 2

## 2011-09-30 MED ORDER — NITROGLYCERIN IN D5W 200-5 MCG/ML-% IV SOLN
2.0000 ug/min | INTRAVENOUS | Status: DC
  Filled 2011-09-30: qty 250

## 2011-09-30 MED ORDER — PAPAVERINE HCL 30 MG/ML IJ SOLN
INTRAMUSCULAR | Status: AC
  Administered 2011-10-01: 11:00:00
  Filled 2011-09-30: qty 0.5

## 2011-09-30 MED ORDER — MAGNESIUM SULFATE 50 % IJ SOLN
40.0000 meq | INTRAMUSCULAR | Status: DC
  Filled 2011-09-30: qty 10

## 2011-09-30 MED ORDER — DEXTROSE 5 % IV SOLN
1.5000 g | INTRAVENOUS | Status: AC
  Administered 2011-10-01: .75 g via INTRAVENOUS
  Administered 2011-10-01: 1.5 g via INTRAVENOUS
  Filled 2011-09-30: qty 1.5

## 2011-09-30 MED ORDER — METOPROLOL TARTRATE 12.5 MG HALF TABLET: 12.5000 mg | ORAL_TABLET | Freq: Once | ORAL | Status: DC

## 2011-09-30 MED ORDER — EPINEPHRINE HCL 1 MG/ML IJ SOLN
0.5000 ug/min | INTRAVENOUS | Status: DC
  Filled 2011-09-30: qty 4

## 2011-09-30 MED ORDER — VANCOMYCIN HCL 1000 MG IV SOLR
1250.0000 mg | INTRAVENOUS | Status: DC
  Filled 2011-09-30: qty 1250

## 2011-09-30 MED ORDER — DOPAMINE-DEXTROSE 3.2-5 MG/ML-% IV SOLN
2.0000 ug/kg/min | INTRAVENOUS | Status: DC
  Filled 2011-09-30: qty 250

## 2011-09-30 MED ORDER — SODIUM CHLORIDE 0.9 % IV SOLN
INTRAVENOUS | Status: DC
  Filled 2011-09-30: qty 40

## 2011-10-01 ENCOUNTER — Other Ambulatory Visit: Payer: Self-pay

## 2011-10-01 ENCOUNTER — Ambulatory Visit: Payer: 59 | Admitting: Cardiology

## 2011-10-01 ENCOUNTER — Ambulatory Visit: Payer: 59 | Admitting: *Deleted

## 2011-10-01 ENCOUNTER — Other Ambulatory Visit: Payer: 59 | Admitting: *Deleted

## 2011-10-01 ENCOUNTER — Other Ambulatory Visit: Payer: Self-pay | Admitting: Thoracic Surgery (Cardiothoracic Vascular Surgery)

## 2011-10-01 ENCOUNTER — Inpatient Hospital Stay (HOSPITAL_COMMUNITY)
Admission: RE | Admit: 2011-10-01 | Discharge: 2011-11-01 | DRG: 219 | Disposition: A | Discharge status: Skilled Nursing Facility | Payer: Medicare Other | Source: Ambulatory Visit | Attending: Thoracic Surgery (Cardiothoracic Vascular Surgery) | Admitting: Thoracic Surgery (Cardiothoracic Vascular Surgery)

## 2011-10-01 ENCOUNTER — Encounter (HOSPITAL_COMMUNITY): Payer: Self-pay | Admitting: Anesthesiology

## 2011-10-01 ENCOUNTER — Inpatient Hospital Stay (HOSPITAL_COMMUNITY): Payer: Medicare Other

## 2011-10-01 ENCOUNTER — Encounter: Payer: Self-pay | Admitting: Thoracic Surgery (Cardiothoracic Vascular Surgery)

## 2011-10-01 ENCOUNTER — Encounter (HOSPITAL_COMMUNITY): Payer: Self-pay | Admitting: *Deleted

## 2011-10-01 ENCOUNTER — Encounter (HOSPITAL_COMMUNITY)
Admission: RE | Disposition: A | Discharge status: Skilled Nursing Facility | Payer: Self-pay | Source: Ambulatory Visit | Attending: Thoracic Surgery (Cardiothoracic Vascular Surgery)

## 2011-10-01 ENCOUNTER — Inpatient Hospital Stay (HOSPITAL_COMMUNITY): Payer: Medicare Other | Admitting: Anesthesiology

## 2011-10-01 DIAGNOSIS — I509 Heart failure, unspecified: Secondary | ICD-10-CM

## 2011-10-01 DIAGNOSIS — J90 Pleural effusion, not elsewhere classified: Secondary | ICD-10-CM | POA: Diagnosis not present

## 2011-10-01 DIAGNOSIS — I4891 Unspecified atrial fibrillation: Secondary | ICD-10-CM | POA: Diagnosis not present

## 2011-10-01 DIAGNOSIS — B353 Tinea pedis: Secondary | ICD-10-CM | POA: Diagnosis present

## 2011-10-01 DIAGNOSIS — B370 Candidal stomatitis: Secondary | ICD-10-CM | POA: Diagnosis not present

## 2011-10-01 DIAGNOSIS — Z9889 Other specified postprocedural states: Secondary | ICD-10-CM

## 2011-10-01 DIAGNOSIS — D62 Acute posthemorrhagic anemia: Secondary | ICD-10-CM | POA: Diagnosis not present

## 2011-10-01 DIAGNOSIS — I340 Nonrheumatic mitral (valve) insufficiency: Secondary | ICD-10-CM | POA: Diagnosis present

## 2011-10-01 DIAGNOSIS — K219 Gastro-esophageal reflux disease without esophagitis: Secondary | ICD-10-CM | POA: Diagnosis present

## 2011-10-01 DIAGNOSIS — L89109 Pressure ulcer of unspecified part of back, unspecified stage: Secondary | ICD-10-CM | POA: Diagnosis present

## 2011-10-01 DIAGNOSIS — Z85820 Personal history of malignant melanoma of skin: Secondary | ICD-10-CM

## 2011-10-01 DIAGNOSIS — E7800 Pure hypercholesterolemia, unspecified: Secondary | ICD-10-CM | POA: Diagnosis present

## 2011-10-01 DIAGNOSIS — I059 Rheumatic mitral valve disease, unspecified: Secondary | ICD-10-CM

## 2011-10-01 DIAGNOSIS — I511 Rupture of chordae tendineae, not elsewhere classified: Secondary | ICD-10-CM | POA: Diagnosis not present

## 2011-10-01 DIAGNOSIS — J95811 Postprocedural pneumothorax: Secondary | ICD-10-CM | POA: Diagnosis not present

## 2011-10-01 DIAGNOSIS — M1990 Unspecified osteoarthritis, unspecified site: Secondary | ICD-10-CM | POA: Diagnosis present

## 2011-10-01 DIAGNOSIS — Y9210 Unspecified residential institution as the place of occurrence of the external cause: Secondary | ICD-10-CM | POA: Diagnosis present

## 2011-10-01 DIAGNOSIS — Z8546 Personal history of malignant neoplasm of prostate: Secondary | ICD-10-CM

## 2011-10-01 DIAGNOSIS — Y832 Surgical operation with anastomosis, bypass or graft as the cause of abnormal reaction of the patient, or of later complication, without mention of misadventure at the time of the procedure: Secondary | ICD-10-CM | POA: Diagnosis present

## 2011-10-01 DIAGNOSIS — R5381 Other malaise: Secondary | ICD-10-CM | POA: Diagnosis present

## 2011-10-01 DIAGNOSIS — D696 Thrombocytopenia, unspecified: Secondary | ICD-10-CM | POA: Diagnosis not present

## 2011-10-01 DIAGNOSIS — D72829 Elevated white blood cell count, unspecified: Secondary | ICD-10-CM | POA: Diagnosis present

## 2011-10-01 DIAGNOSIS — H6120 Impacted cerumen, unspecified ear: Secondary | ICD-10-CM | POA: Diagnosis present

## 2011-10-01 DIAGNOSIS — I279 Pulmonary heart disease, unspecified: Secondary | ICD-10-CM | POA: Diagnosis present

## 2011-10-01 DIAGNOSIS — R197 Diarrhea, unspecified: Secondary | ICD-10-CM | POA: Diagnosis present

## 2011-10-01 DIAGNOSIS — L8990 Pressure ulcer of unspecified site, unspecified stage: Secondary | ICD-10-CM | POA: Diagnosis not present

## 2011-10-01 DIAGNOSIS — Z860100 Personal history of colon polyps, unspecified: Secondary | ICD-10-CM

## 2011-10-01 DIAGNOSIS — L8992 Pressure ulcer of unspecified site, stage 2: Secondary | ICD-10-CM | POA: Diagnosis present

## 2011-10-01 DIAGNOSIS — I10 Essential (primary) hypertension: Secondary | ICD-10-CM | POA: Diagnosis present

## 2011-10-01 HISTORY — PX: MITRAL VALVE REPAIR: SHX2039

## 2011-10-01 LAB — POCT I-STAT 4, (NA,K, GLUC, HGB,HCT)
Glucose, Bld: 133 mg/dL — ABNORMAL HIGH (ref 70–99)
Glucose, Bld: 201 mg/dL — ABNORMAL HIGH (ref 70–99)
Glucose, Bld: 164 mg/dL — ABNORMAL HIGH (ref 70–99)
Glucose, Bld: 175 mg/dL — ABNORMAL HIGH (ref 70–99)
Glucose, Bld: 179 mg/dL — ABNORMAL HIGH (ref 70–99)
Glucose, Bld: 135 mg/dL — ABNORMAL HIGH (ref 70–99)
Glucose, Bld: 92 mg/dL (ref 70–99)
HCT: 28 % — ABNORMAL LOW (ref 39.0–52.0)
HCT: 32 % — ABNORMAL LOW (ref 39.0–52.0)
HCT: 23 % — ABNORMAL LOW (ref 39.0–52.0)
HCT: 25 % — ABNORMAL LOW (ref 39.0–52.0)
HCT: 24 % — ABNORMAL LOW (ref 39.0–52.0)
HCT: 29 % — ABNORMAL LOW (ref 39.0–52.0)
HCT: 26 % — ABNORMAL LOW (ref 39.0–52.0)
HCT: 28 % — ABNORMAL LOW (ref 39.0–52.0)
HCT: 27 % — ABNORMAL LOW (ref 39.0–52.0)
Hemoglobin: 12.2 g/dL — ABNORMAL LOW (ref 13.0–17.0)
Hemoglobin: 8.5 g/dL — ABNORMAL LOW (ref 13.0–17.0)
Hemoglobin: 7.5 g/dL — ABNORMAL LOW (ref 13.0–17.0)
Hemoglobin: 8.5 g/dL — ABNORMAL LOW (ref 13.0–17.0)
Hemoglobin: 8.2 g/dL — ABNORMAL LOW (ref 13.0–17.0)
Hemoglobin: 9.9 g/dL — ABNORMAL LOW (ref 13.0–17.0)
Hemoglobin: 8.8 g/dL — ABNORMAL LOW (ref 13.0–17.0)
Hemoglobin: 9.5 g/dL — ABNORMAL LOW (ref 13.0–17.0)
Hemoglobin: 9.2 g/dL — ABNORMAL LOW (ref 13.0–17.0)
Potassium: 4.1 mEq/L (ref 3.5–5.1)
Potassium: 4.2 mEq/L (ref 3.5–5.1)
Potassium: 4.6 mEq/L (ref 3.5–5.1)
Potassium: 3.6 mEq/L (ref 3.5–5.1)
Potassium: 5.4 mEq/L — ABNORMAL HIGH (ref 3.5–5.1)
Potassium: 3.4 mEq/L — ABNORMAL LOW (ref 3.5–5.1)
Potassium: 3.5 meq/L (ref 3.5–5.1)
Potassium: 3.7 meq/L (ref 3.5–5.1)
Potassium: 3.2 meq/L — ABNORMAL LOW (ref 3.5–5.1)
Sodium: 139 mEq/L (ref 135–145)
Sodium: 146 mEq/L — ABNORMAL HIGH (ref 135–145)
Sodium: 141 mEq/L (ref 135–145)
Sodium: 146 meq/L — ABNORMAL HIGH (ref 135–145)
Sodium: 147 mEq/L — ABNORMAL HIGH (ref 135–145)
Sodium: 152 meq/L — ABNORMAL HIGH (ref 135–145)
Sodium: 143 meq/L (ref 135–145)

## 2011-10-01 LAB — POCT I-STAT 3, ART BLOOD GAS (G3+)
Acid-base deficit: 7 mmol/L — ABNORMAL HIGH (ref 0.0–2.0)
Acid-base deficit: 3 mmol/L — ABNORMAL HIGH (ref 0.0–2.0)
Acid-base deficit: 2 mmol/L (ref 0.0–2.0)
Acid-base deficit: 1 mmol/L (ref 0.0–2.0)
Bicarbonate: 24.8 mEq/L — ABNORMAL HIGH (ref 20.0–24.0)
Bicarbonate: 23.2 meq/L (ref 20.0–24.0)
Bicarbonate: 24.2 mEq/L — ABNORMAL HIGH (ref 20.0–24.0)
Bicarbonate: 25 mEq/L — ABNORMAL HIGH (ref 20.0–24.0)
Bicarbonate: 23.6 meq/L (ref 20.0–24.0)
O2 Saturation: 100 %
O2 Saturation: 100 %
O2 Saturation: 94 %
O2 Saturation: 95 %
O2 Saturation: 86 %
O2 Saturation: 96 %
Patient temperature: 36.2
Patient temperature: 35.5
Patient temperature: 34.9
Patient temperature: 35.6
TCO2: 26 mmol/L (ref 0–100)
TCO2: 22 mmol/L (ref 0–100)
TCO2: 25 mmol/L (ref 0–100)
TCO2: 26 mmol/L (ref 0–100)
TCO2: 25 mmol/L (ref 0–100)
pCO2 arterial: 40.8 mmHg (ref 35.0–45.0)
pCO2 arterial: 49.1 mmHg — ABNORMAL HIGH (ref 35.0–45.0)
pCO2 arterial: 43.9 mmHg (ref 35.0–45.0)
pCO2 arterial: 44.2 mmHg (ref 35.0–45.0)
pCO2 arterial: 30 mmHg — ABNORMAL LOW (ref 35.0–45.0)
pH, Arterial: 7.305 — ABNORMAL LOW (ref 7.350–7.450)
pH, Arterial: 7.328 — ABNORMAL LOW (ref 7.350–7.450)
pH, Arterial: 7.382 (ref 7.350–7.450)
pH, Arterial: 7.499 — ABNORMAL HIGH (ref 7.350–7.450)
pO2, Arterial: 269 mmHg — ABNORMAL HIGH (ref 80.0–100.0)
pO2, Arterial: 228 mmHg — ABNORMAL HIGH (ref 80.0–100.0)
pO2, Arterial: 215 mmHg — ABNORMAL HIGH (ref 80.0–100.0)
pO2, Arterial: 74 mmHg — ABNORMAL LOW (ref 80.0–100.0)
pO2, Arterial: 72 mmHg — ABNORMAL LOW (ref 80.0–100.0)
pO2, Arterial: 54 mmHg — ABNORMAL LOW (ref 80.0–100.0)
pO2, Arterial: 67 mmHg — ABNORMAL LOW (ref 80.0–100.0)

## 2011-10-01 LAB — CBC
HCT: 30 % — ABNORMAL LOW (ref 39.0–52.0)
Hemoglobin: 10 g/dL — ABNORMAL LOW (ref 13.0–17.0)
Hemoglobin: 10.5 g/dL — ABNORMAL LOW (ref 13.0–17.0)
MCH: 29.9 pg (ref 26.0–34.0)
MCHC: 35 g/dL (ref 30.0–36.0)
MCV: 85.5 fL (ref 78.0–100.0)
Platelets: 121 10*3/uL — ABNORMAL LOW (ref 150–400)
Platelets: 90 10*3/uL — ABNORMAL LOW (ref 150–400)
Platelets: 98 K/uL — ABNORMAL LOW (ref 150–400)
RBC: 3.34 MIL/uL — ABNORMAL LOW (ref 4.22–5.81)
RBC: 3.51 MIL/uL — ABNORMAL LOW (ref 4.22–5.81)
RDW: 14.4 % (ref 11.5–15.5)
RDW: 14.8 % (ref 11.5–15.5)
WBC: 11.5 10*3/uL — ABNORMAL HIGH (ref 4.0–10.5)
WBC: 12.3 K/uL — ABNORMAL HIGH (ref 4.0–10.5)

## 2011-10-01 LAB — DIC (DISSEMINATED INTRAVASCULAR COAGULATION)PANEL
D-Dimer, Quant: 0.82 ug/mL-FEU — ABNORMAL HIGH (ref 0.00–0.48)
D-Dimer, Quant: 0.76 ug/mL-FEU — ABNORMAL HIGH (ref 0.00–0.48)
Fibrinogen: 159 mg/dL — ABNORMAL LOW (ref 204–475)
Fibrinogen: 184 mg/dL — ABNORMAL LOW (ref 204–475)
INR: 1 (ref 0.00–1.49)
Prothrombin Time: 13.4 seconds (ref 11.6–15.2)
Smear Review: NONE SEEN

## 2011-10-01 LAB — GLUCOSE, CAPILLARY: Glucose-Capillary: 68 mg/dL — ABNORMAL LOW (ref 70–99)

## 2011-10-01 LAB — PROTIME-INR
INR: 1.04 (ref 0.00–1.49)
Prothrombin Time: 13.8 seconds (ref 11.6–15.2)

## 2011-10-01 LAB — POCT I-STAT GLUCOSE
Operator id: 173792
Operator id: 3406

## 2011-10-01 SURGERY — REPAIR, MITRAL VALVE, MINIMALLY INVASIVE
Anesthesia: General | Site: Chest | Laterality: Right | Wound class: Clean

## 2011-10-01 MED ORDER — ACETAMINOPHEN 500 MG PO TABS
1000.0000 mg | ORAL_TABLET | Freq: Four times a day (QID) | ORAL | Status: DC
  Administered 2011-10-02 – 2011-10-05 (×11): 1000 mg via ORAL
  Administered 2011-10-05: 500 mg via ORAL
  Filled 2011-10-01 (×17): qty 2

## 2011-10-01 MED ORDER — ACETAMINOPHEN 650 MG RE SUPP: 650.0000 mg | RECTAL | Status: AC

## 2011-10-01 MED ORDER — NITROGLYCERIN IN D5W 200-5 MCG/ML-% IV SOLN: 0.0000 ug/min | INTRAVENOUS | Status: DC

## 2011-10-01 MED ORDER — DEXTROSE 5 % IV SOLN: 60.0000 mg/h | INTRAVENOUS | Status: DC

## 2011-10-01 MED ORDER — NOREPINEPHRINE BITARTRATE 1 MG/ML IJ SOLN
2.0000 ug/min | INTRAVENOUS | Status: DC
  Filled 2011-10-01: qty 4

## 2011-10-01 MED ORDER — SODIUM CHLORIDE 0.9 % IR SOLN
Status: DC | PRN
  Administered 2011-10-01: 1000 mL

## 2011-10-01 MED ORDER — AMIODARONE HCL 200 MG PO TABS
200.0000 mg | ORAL_TABLET | Freq: Two times a day (BID) | ORAL | Status: DC
  Filled 2011-10-01: qty 1

## 2011-10-01 MED ORDER — METOPROLOL TARTRATE 1 MG/ML IV SOLN: 2.5000 mg | INTRAVENOUS | Status: DC | PRN

## 2011-10-01 MED ORDER — DEXTROSE 5 % IV SOLN
150.0000 mg | Freq: Once | INTRAVENOUS | Status: AC
  Administered 2011-10-01: 150 mg via INTRAVENOUS
  Filled 2011-10-01: qty 3

## 2011-10-01 MED ORDER — SODIUM CHLORIDE 0.9 % IV SOLN
10.0000 g | INTRAVENOUS | Status: DC | PRN
  Administered 2011-10-01: 5 g/h via INTRAVENOUS

## 2011-10-01 MED ORDER — CHLORHEXIDINE GLUCONATE 4 % EX LIQD: 30.0000 mL | CUTANEOUS | Status: DC

## 2011-10-01 MED ORDER — COAGULATION FACTOR VIIA RECOMB 1 MG IV SOLR
90.0000 ug/kg | INTRAVENOUS | Status: DC
  Filled 2011-10-01: qty 8

## 2011-10-01 MED ORDER — HEMOSTATIC AGENTS (NO CHARGE) OPTIME
TOPICAL | Status: DC | PRN
  Administered 2011-10-01: 6 via TOPICAL

## 2011-10-01 MED ORDER — MAGNESIUM SULFATE 50 % IJ SOLN
4.0000 g | Freq: Once | INTRAVENOUS | Status: AC
  Administered 2011-10-01: 4 g via INTRAVENOUS
  Filled 2011-10-01: qty 8

## 2011-10-01 MED ORDER — DEXTROSE 5 % IV SOLN: 30.0000 mg/h | INTRAVENOUS | Status: DC

## 2011-10-01 MED ORDER — MILRINONE IN DEXTROSE 200-5 MCG/ML-% IV SOLN
INTRAVENOUS | Status: DC | PRN
  Administered 2011-10-01: .3 ug/kg/min via INTRAVENOUS

## 2011-10-01 MED ORDER — SODIUM CHLORIDE 0.9 % IV SOLN
INTRAVENOUS | Status: DC | PRN
  Administered 2011-10-01: 14:00:00 via INTRAVENOUS

## 2011-10-01 MED ORDER — SODIUM CHLORIDE (HYPERTONIC) 5 % OP OINT
TOPICAL_OINTMENT | Freq: Every day | OPHTHALMIC | Status: DC
  Filled 2011-10-01: qty 3.5

## 2011-10-01 MED ORDER — CALCIUM CHLORIDE 10 % IV SOLN
INTRAVENOUS | Status: DC | PRN
  Administered 2011-10-01 (×2): 1 g via INTRAVENOUS
  Administered 2011-10-01: .5 g via INTRAVENOUS
  Administered 2011-10-01 (×2): 1 g via INTRAVENOUS
  Administered 2011-10-01: .5 g via INTRAVENOUS

## 2011-10-01 MED ORDER — MILRINONE IN DEXTROSE 200-5 MCG/ML-% IV SOLN
0.1000 ug/kg/min | INTRAVENOUS | Status: DC
  Filled 2011-10-01: qty 100

## 2011-10-01 MED ORDER — MIDAZOLAM HCL 5 MG/5ML IJ SOLN
INTRAMUSCULAR | Status: DC | PRN
  Administered 2011-10-01: 4 mg via INTRAVENOUS
  Administered 2011-10-01: 1 mg via INTRAVENOUS
  Administered 2011-10-01: 7 mg via INTRAVENOUS
  Administered 2011-10-01 (×2): 3 mg via INTRAVENOUS
  Administered 2011-10-01: 2 mg via INTRAVENOUS

## 2011-10-01 MED ORDER — LACTATED RINGERS IV SOLN
INTRAVENOUS | Status: DC | PRN
  Administered 2011-10-01 (×3): via INTRAVENOUS

## 2011-10-01 MED ORDER — METOPROLOL TARTRATE 12.5 MG HALF TABLET
12.5000 mg | ORAL_TABLET | Freq: Two times a day (BID) | ORAL | Status: DC
  Administered 2011-10-03 – 2011-10-04 (×3): 12.5 mg via ORAL
  Filled 2011-10-01 (×9): qty 1

## 2011-10-01 MED ORDER — SUFENTANIL CITRATE 50 MCG/ML IV SOLN
INTRAVENOUS | Status: DC | PRN
  Administered 2011-10-01: 20 ug via INTRAVENOUS
  Administered 2011-10-01: 30 ug via INTRAVENOUS
  Administered 2011-10-01: 10 ug via INTRAVENOUS
  Administered 2011-10-01: 15 ug via INTRAVENOUS
  Administered 2011-10-01: 5 ug via INTRAVENOUS

## 2011-10-01 MED ORDER — VANCOMYCIN HCL 1000 MG IV SOLR
1250.0000 mg | INTRAVENOUS | Status: DC | PRN
  Administered 2011-10-01: 12.5 g via INTRAVENOUS

## 2011-10-01 MED ORDER — ALBUMIN HUMAN 5 % IV SOLN
250.0000 mL | INTRAVENOUS | Status: AC | PRN
  Administered 2011-10-01 – 2011-10-02 (×6): 250 mL via INTRAVENOUS
  Administered 2011-10-02: 12.5 g via INTRAVENOUS
  Administered 2011-10-02: 250 mL via INTRAVENOUS
  Filled 2011-10-01 (×3): qty 250

## 2011-10-01 MED ORDER — METOPROLOL TARTRATE 25 MG/10 ML ORAL SUSPENSION
12.5000 mg | Freq: Two times a day (BID) | ORAL | Status: DC
  Administered 2011-10-03: 12.5 mg
  Filled 2011-10-01 (×9): qty 5

## 2011-10-01 MED ORDER — DOPAMINE-DEXTROSE 3.2-5 MG/ML-% IV SOLN
INTRAVENOUS | Status: DC | PRN
  Administered 2011-10-01: 3 ug/kg/min via INTRAVENOUS

## 2011-10-01 MED ORDER — OXYCODONE HCL 5 MG PO TABS
5.0000 mg | ORAL_TABLET | ORAL | Status: DC | PRN
  Administered 2011-10-03 – 2011-10-04 (×7): 5 mg via ORAL
  Filled 2011-10-01 (×8): qty 1

## 2011-10-01 MED ORDER — SODIUM CHLORIDE 0.9 % IV SOLN
INTRAVENOUS | Status: DC
  Filled 2011-10-01: qty 1

## 2011-10-01 MED ORDER — SODIUM CHLORIDE 0.9 % IJ SOLN: 3.0000 mL | INTRAMUSCULAR | Status: DC | PRN

## 2011-10-01 MED ORDER — EPINEPHRINE HCL 1 MG/ML IJ SOLN
1000.0000 ug | INTRAVENOUS | Status: DC | PRN
  Administered 2011-10-01: 1 ug/kg/min via INTRAVENOUS

## 2011-10-01 MED ORDER — SODIUM CHLORIDE 0.45 % IV SOLN
INTRAVENOUS | Status: DC
  Administered 2011-10-01: 21:00:00 via INTRAVENOUS

## 2011-10-01 MED ORDER — CALCIUM CHLORIDE 10 % IV SOLN
1.0000 g | Freq: Once | INTRAVENOUS | Status: AC | PRN
  Filled 2011-10-01: qty 10

## 2011-10-01 MED ORDER — GLUTARALDEHYDE 0.625% SOAKING SOLUTION
Freq: Once | TOPICAL | Status: AC
  Administered 2011-10-01: 1 via TOPICAL
  Filled 2011-10-01: qty 50

## 2011-10-01 MED ORDER — SODIUM CHLORIDE 0.9 % IJ SOLN
OROMUCOSAL | Status: DC | PRN
  Administered 2011-10-01 (×2): via TOPICAL

## 2011-10-01 MED ORDER — VANCOMYCIN HCL 1000 MG IV SOLR
1000.0000 mg | Freq: Once | INTRAVENOUS | Status: AC
  Administered 2011-10-02: 1000 mg via INTRAVENOUS
  Filled 2011-10-01: qty 1000

## 2011-10-01 MED ORDER — DOCUSATE SODIUM 100 MG PO CAPS
200.0000 mg | ORAL_CAPSULE | Freq: Every day | ORAL | Status: DC
  Administered 2011-10-03 – 2011-10-04 (×2): 200 mg via ORAL
  Filled 2011-10-01 (×3): qty 2

## 2011-10-01 MED ORDER — CYCLOSPORINE 0.05 % OP EMUL
1.0000 [drp] | Freq: Two times a day (BID) | OPHTHALMIC | Status: DC
  Administered 2011-10-02 – 2011-10-04 (×3): 1 [drp] via OPHTHALMIC
  Administered 2011-10-04: 22:00:00 via OPHTHALMIC
  Administered 2011-10-05: 1 [drp] via OPHTHALMIC
  Filled 2011-10-01 (×9): qty 1

## 2011-10-01 MED ORDER — PROPOFOL 10 MG/ML IV EMUL
INTRAVENOUS | Status: DC | PRN
  Administered 2011-10-01: 90 mg via INTRAVENOUS

## 2011-10-01 MED ORDER — ALBUMIN HUMAN 5 % IV SOLN
INTRAVENOUS | Status: DC | PRN
  Administered 2011-10-01: 15:00:00 via INTRAVENOUS
  Administered 2011-10-01: 21:00:00

## 2011-10-01 MED ORDER — SODIUM CHLORIDE 0.9 % IV SOLN
INTRAVENOUS | Status: DC
  Administered 2011-10-01: 21:00:00 via INTRAVENOUS

## 2011-10-01 MED ORDER — SODIUM CHLORIDE 0.9 % IV SOLN
0.1000 ug/kg/h | INTRAVENOUS | Status: DC
  Filled 2011-10-01: qty 2

## 2011-10-01 MED ORDER — INSULIN ASPART 100 UNIT/ML ~~LOC~~ SOLN
0.0000 [IU] | SUBCUTANEOUS | Status: DC
  Filled 2011-10-01: qty 3

## 2011-10-01 MED ORDER — PHENYLEPHRINE HCL 10 MG/ML IJ SOLN
0.0000 ug/min | INTRAMUSCULAR | Status: DC
  Administered 2011-10-01: 100 ug/min via INTRAVENOUS
  Administered 2011-10-02: 80 ug/min via INTRAVENOUS
  Filled 2011-10-01 (×3): qty 2

## 2011-10-01 MED ORDER — HEPARIN SODIUM (PORCINE) 1000 UNIT/ML IJ SOLN
INTRAMUSCULAR | Status: DC | PRN
  Administered 2011-10-01 (×2): 28000 [IU] via INTRAVENOUS

## 2011-10-01 MED ORDER — DEXTROSE 5 % IV SOLN
150.0000 mg | Freq: Once | INTRAVENOUS | Status: DC
  Filled 2011-10-01: qty 3

## 2011-10-01 MED ORDER — FAMOTIDINE IN NACL 20-0.9 MG/50ML-% IV SOLN
20.0000 mg | Freq: Two times a day (BID) | INTRAVENOUS | Status: AC
  Administered 2011-10-01: 20 mg via INTRAVENOUS
  Filled 2011-10-01: qty 50

## 2011-10-01 MED ORDER — MIDAZOLAM HCL 2 MG/2ML IJ SOLN: 2.0000 mg | INTRAMUSCULAR | Status: DC | PRN

## 2011-10-01 MED ORDER — DOPAMINE-DEXTROSE 3.2-5 MG/ML-% IV SOLN
0.0000 ug/kg/min | INTRAVENOUS | Status: DC
  Filled 2011-10-01: qty 250

## 2011-10-01 MED ORDER — BISACODYL 10 MG RE SUPP: 10.0000 mg | Freq: Every day | RECTAL | Status: DC

## 2011-10-01 MED ORDER — SODIUM CHLORIDE 0.9 % IV SOLN
5.0000 g | Freq: Once | INTRAVENOUS | Status: DC
  Filled 2011-10-01: qty 20

## 2011-10-01 MED ORDER — DEXTROSE 5 % IV SOLN
1.5000 g | Freq: Two times a day (BID) | INTRAVENOUS | Status: AC
  Administered 2011-10-02 – 2011-10-03 (×4): 1.5 g via INTRAVENOUS
  Filled 2011-10-01 (×4): qty 1.5

## 2011-10-01 MED ORDER — NITROGLYCERIN IN D5W 200-5 MCG/ML-% IV SOLN
INTRAVENOUS | Status: DC | PRN
  Administered 2011-10-01: 5 ug/min via INTRAVENOUS

## 2011-10-01 MED ORDER — ROCURONIUM BROMIDE 100 MG/10ML IV SOLN
INTRAVENOUS | Status: DC | PRN
  Administered 2011-10-01: 20 mg via INTRAVENOUS
  Administered 2011-10-01: 70 mg via INTRAVENOUS
  Administered 2011-10-01 (×3): 50 mg via INTRAVENOUS
  Administered 2011-10-01: 30 mg via INTRAVENOUS
  Administered 2011-10-01: 50 mg via INTRAVENOUS
  Administered 2011-10-01: 80 mg via INTRAVENOUS
  Administered 2011-10-01: 50 mg via INTRAVENOUS
  Administered 2011-10-01: 30 mg via INTRAVENOUS

## 2011-10-01 MED ORDER — POTASSIUM CHLORIDE 10 MEQ/50ML IV SOLN
10.0000 meq | INTRAVENOUS | Status: AC
  Administered 2011-10-01 (×3): 10 meq via INTRAVENOUS

## 2011-10-01 MED ORDER — MORPHINE SULFATE 2 MG/ML IJ SOLN: 1.0000 mg | INTRAMUSCULAR | Status: AC | PRN

## 2011-10-01 MED ORDER — DEXTROSE 50 % IV SOLN
INTRAVENOUS | Status: AC
Start: 2011-10-01 — End: 2011-10-01
  Administered 2011-10-01: 13 mL via INTRAVENOUS
  Filled 2011-10-01: qty 50

## 2011-10-01 MED ORDER — MORPHINE SULFATE 4 MG/ML IJ SOLN
2.0000 mg | INTRAMUSCULAR | Status: DC | PRN
  Administered 2011-10-02: 2 mg via INTRAVENOUS
  Administered 2011-10-02 – 2011-10-03 (×2): 4 mg via INTRAVENOUS
  Filled 2011-10-01 (×3): qty 1

## 2011-10-01 MED ORDER — PROTAMINE SULFATE 10 MG/ML IV SOLN
INTRAVENOUS | Status: DC | PRN
  Administered 2011-10-01 (×2): 25 mg via INTRAVENOUS

## 2011-10-01 MED ORDER — PANTOPRAZOLE SODIUM 40 MG PO TBEC
40.0000 mg | DELAYED_RELEASE_TABLET | Freq: Every day | ORAL | Status: DC
  Administered 2011-10-03 – 2011-10-04 (×2): 40 mg via ORAL
  Filled 2011-10-01 (×2): qty 1

## 2011-10-01 MED ORDER — BISACODYL 5 MG PO TBEC
10.0000 mg | DELAYED_RELEASE_TABLET | Freq: Every day | ORAL | Status: DC
  Administered 2011-10-03 – 2011-10-05 (×3): 10 mg via ORAL
  Filled 2011-10-01 (×3): qty 2

## 2011-10-01 MED ORDER — SIMVASTATIN 20 MG PO TABS
20.0000 mg | ORAL_TABLET | Freq: Every day | ORAL | Status: DC
  Administered 2011-10-02 – 2011-10-04 (×3): 20 mg via ORAL
  Filled 2011-10-01 (×4): qty 1

## 2011-10-01 MED ORDER — NOREPINEPHRINE BITARTRATE 1 MG/ML IJ SOLN
4.0000 mg | INTRAVENOUS | Status: DC | PRN
  Administered 2011-10-01: 2 ug/kg/min via INTRAVENOUS

## 2011-10-01 MED ORDER — SODIUM CHLORIDE 0.9 % IJ SOLN
3.0000 mL | Freq: Two times a day (BID) | INTRAMUSCULAR | Status: DC
  Administered 2011-10-02 – 2011-10-04 (×6): 3 mL via INTRAVENOUS

## 2011-10-01 MED ORDER — SODIUM CHLORIDE 0.9 % IV SOLN: 250.0000 mL | INTRAVENOUS | Status: DC

## 2011-10-01 MED ORDER — ASPIRIN EC 325 MG PO TBEC
325.0000 mg | DELAYED_RELEASE_TABLET | Freq: Every day | ORAL | Status: DC
  Administered 2011-10-03 – 2011-10-04 (×2): 325 mg via ORAL
  Filled 2011-10-01 (×3): qty 1

## 2011-10-01 MED ORDER — LACTATED RINGERS IV SOLN: INTRAVENOUS | Status: DC

## 2011-10-01 MED ORDER — ALBUMIN HUMAN 5 % IV SOLN
INTRAVENOUS | Status: DC | PRN
  Administered 2011-10-01 (×2): via INTRAVENOUS

## 2011-10-01 MED ORDER — SODIUM CHLORIDE 0.9 % IV SOLN
200.0000 ug | INTRAVENOUS | Status: DC | PRN
  Administered 2011-10-01: 0.2 ug/kg/h via INTRAVENOUS

## 2011-10-01 MED ORDER — GLYCOPYRROLATE 0.2 MG/ML IJ SOLN
INTRAMUSCULAR | Status: DC | PRN
  Administered 2011-10-01: 0.2 mg via INTRAVENOUS

## 2011-10-01 MED ORDER — ONDANSETRON HCL 4 MG/2ML IJ SOLN
4.0000 mg | Freq: Four times a day (QID) | INTRAMUSCULAR | Status: DC | PRN
  Administered 2011-10-02 – 2011-10-04 (×2): 4 mg via INTRAVENOUS
  Filled 2011-10-01 (×2): qty 2

## 2011-10-01 MED ORDER — DEXTROSE 5 % IV SOLN: 150.0000 mg | Freq: Once | INTRAVENOUS | Status: DC

## 2011-10-01 MED ORDER — DEXTROSE 5 % IV SOLN
30.0000 mg/h | INTRAVENOUS | Status: DC
  Administered 2011-10-02 (×2): 30 mg/h via INTRAVENOUS
  Filled 2011-10-01 (×3): qty 9

## 2011-10-01 MED ORDER — DEXTROSE 5 % IV SOLN
60.0000 mg/h | INTRAVENOUS | Status: AC
  Administered 2011-10-02 (×3): 60 mg/h via INTRAVENOUS
  Filled 2011-10-01 (×2): qty 9

## 2011-10-01 MED ORDER — ACETAMINOPHEN 160 MG/5ML PO SOLN
650.0000 mg | ORAL | Status: AC
  Administered 2011-10-02: 650 mg
  Filled 2011-10-01: qty 20.3

## 2011-10-01 MED ORDER — ACETAMINOPHEN 160 MG/5ML PO SOLN
975.0000 mg | Freq: Four times a day (QID) | ORAL | Status: DC
  Administered 2011-10-02: 975 mg
  Filled 2011-10-01 (×2): qty 40.6

## 2011-10-01 MED ORDER — ASPIRIN 81 MG PO CHEW: 324.0000 mg | CHEWABLE_TABLET | Freq: Every day | ORAL | Status: DC

## 2011-10-01 MED ORDER — SODIUM CHLORIDE 0.9 % IV SOLN
100.0000 [IU] | INTRAVENOUS | Status: DC | PRN
  Administered 2011-10-01: 2.1 [IU]/h via INTRAVENOUS

## 2011-10-01 MED FILL — Insulin Regular (Human) Inj 100 Unit/ML: INTRAMUSCULAR | Qty: 10 | Status: AC

## 2011-10-01 MED FILL — Dexmedetomidine HCl IV Soln 200 MCG/2ML: INTRAVENOUS | Qty: 4 | Status: AC

## 2011-10-01 MED FILL — Epinephrine HCl Inj 1 MG/ML: INTRAMUSCULAR | Qty: 4 | Status: AC

## 2011-10-01 MED FILL — Cefuroxime Sodium For Inj 1.5 GM: INTRAMUSCULAR | Qty: 1.5 | Status: AC

## 2011-10-01 MED FILL — Cefuroxime Sodium For Inj 750 MG: INTRAMUSCULAR | Qty: 750 | Status: AC

## 2011-10-01 MED FILL — Vancomycin HCl For IV Soln 1 GM (Base Equivalent): INTRAVENOUS | Qty: 1250 | Status: AC

## 2011-10-01 MED FILL — Aminocaproic Acid Inj 250 MG/ML: INTRAVENOUS | Qty: 40 | Status: AC

## 2011-10-01 MED FILL — Phenylephrine HCl Inj 10 MG/ML: INTRAMUSCULAR | Qty: 2 | Status: AC

## 2011-10-01 MED FILL — Heparin Sodium (Porcine) Inj 5000 Unit/ML: INTRAMUSCULAR | Qty: 1 | Status: AC

## 2011-10-01 MED FILL — Dopamine in Dextrose 5% Inj 3.2 MG/ML: INTRAVENOUS | Qty: 250 | Status: AC

## 2011-10-01 MED FILL — Nitroglycerin IV Soln 200 MCG/ML in D5W: INTRAVENOUS | Qty: 250 | Status: AC

## 2011-10-01 MED FILL — Papaverine HCl Inj 30 MG/ML: INTRAMUSCULAR | Qty: 2 | Status: AC

## 2011-10-01 MED FILL — Potassium Chloride Inj 2 mEq/ML: INTRAVENOUS | Qty: 40 | Status: AC

## 2011-10-01 MED FILL — Magnesium Sulfate Inj 50%: INTRAMUSCULAR | Qty: 2 | Status: AC

## 2011-10-01 SURGICAL SUPPLY — 140 items
ADAPTER CARDIO PERF ANTE/RETRO (ADAPTER) ×2 IMPLANT
ADH SKN CLS APL DERMABOND .7 (GAUZE/BANDAGES/DRESSINGS) ×3
ADPR PRFSN 84XANTGRD RTRGD (ADAPTER) ×1
APL SKNCLS STERI-STRIP NONHPOA (GAUZE/BANDAGES/DRESSINGS) ×1
ATTRACTOMAT 16X20 MAGNETIC DRP (DRAPES) ×2 IMPLANT
BAG DECANTER FOR FLEXI CONT (MISCELLANEOUS) ×2 IMPLANT
BENZOIN TINCTURE PRP APPL 2/3 (GAUZE/BANDAGES/DRESSINGS) ×2 IMPLANT
BIPOLAR TEMPORARY MYOCARDIAL PACING LEAD ×1 IMPLANT
BLADE STERNUM SYSTEM 6 (BLADE) ×2 IMPLANT
BLADE SURG 11 STRL SS (BLADE) ×2 IMPLANT
CANISTER SUCTION 2500CC (MISCELLANEOUS) ×4 IMPLANT
CANNULA FEM VENOUS REMOTE 22FR (CANNULA) ×1 IMPLANT
CANNULA FEMORAL ART 14 SM (MISCELLANEOUS) ×2 IMPLANT
CANNULA GUNDRY RCSP 15FR (MISCELLANEOUS) ×2 IMPLANT
CANNULA OPTISITE PERFUSION 16F (CANNULA) IMPLANT
CANNULA OPTISITE PERFUSION 18F (CANNULA) ×1 IMPLANT
CARDIAC SUCTION (MISCELLANEOUS) ×2 IMPLANT
CATH AORTIC ROOT CAL-MED ×2 IMPLANT
CATH CPB KIT OWEN (MISCELLANEOUS) ×1 IMPLANT
CATH ROBINSON RED A/P 18FR (CATHETERS) ×3 IMPLANT
CLOTH BEACON ORANGE TIMEOUT ST (SAFETY) ×2 IMPLANT
CONN ST 1/4X3/8  BEN (MISCELLANEOUS) ×3
CONN ST 1/4X3/8 BEN (MISCELLANEOUS) ×3 IMPLANT
CONNECTOR 1/2X3/8X1/2 3 WAY (MISCELLANEOUS) ×1
CONNECTOR 1/2X3/8X1/2 3WAY (MISCELLANEOUS) IMPLANT
CONT SPEC STER OR (MISCELLANEOUS) ×2 IMPLANT
COVER MAYO STAND STRL (DRAPES) ×2 IMPLANT
COVER SURGICAL LIGHT HANDLE (MISCELLANEOUS) ×4 IMPLANT
CRADLE DONUT ADULT HEAD (MISCELLANEOUS) ×2 IMPLANT
DERMABOND ADVANCED (GAUZE/BANDAGES/DRESSINGS) ×3
DERMABOND ADVANCED .7 DNX12 (GAUZE/BANDAGES/DRESSINGS) ×3 IMPLANT
DEVICE PMI PUNCTURE CLOSURE (MISCELLANEOUS) ×1 IMPLANT
DEVICE TROCAR PUNCTURE CLOSURE (ENDOMECHANICALS) ×2 IMPLANT
DRAIN CHANNEL 28F RND 3/8 FF (WOUND CARE) ×5 IMPLANT
DRAPE BILATERAL SPLIT (DRAPES) ×2 IMPLANT
DRAPE C-ARM 42X72 X-RAY (DRAPES) ×2 IMPLANT
DRAPE CV SPLIT W-CLR ANES SCRN (DRAPES) ×2 IMPLANT
DRAPE INCISE IOBAN 66X45 STRL (DRAPES) ×4 IMPLANT
DRAPE SLUSH/WARMER DISC (DRAPES) IMPLANT
DRSG COVADERM 4X14 (GAUZE/BANDAGES/DRESSINGS) ×1 IMPLANT
DRSG COVADERM 4X8 (GAUZE/BANDAGES/DRESSINGS) ×2 IMPLANT
ELECT BLADE 6.5 EXT (BLADE) ×2 IMPLANT
ELECT REM PT RETURN 9FT ADLT (ELECTROSURGICAL) ×4
ELECTRODE REM PT RTRN 9FT ADLT (ELECTROSURGICAL) ×2 IMPLANT
ESTECH VASCULAR DILATOR KIT ×1 IMPLANT
FEMORAL VENOUS CANN RAP (CANNULA) IMPLANT
GAUZE SPONGE 4X4 12PLY STRL LF (GAUZE/BANDAGES/DRESSINGS) ×1 IMPLANT
GAUZE SPONGE 4X4 16PLY XRAY LF (GAUZE/BANDAGES/DRESSINGS) ×1 IMPLANT
GLOVE BIO SURGEON STRL SZ 6.5 (GLOVE) ×6 IMPLANT
GLOVE ORTHO TXT STRL SZ7.5 (GLOVE) ×6 IMPLANT
GOWN STRL NON-REIN LRG LVL3 (GOWN DISPOSABLE) ×12 IMPLANT
GRAFT PTCH CORMATRIX 7X10 4PLY (Prosthesis & Implant Heart) ×1 IMPLANT
GUIDEWIRE ANG ZIPWIRE 038X150 (WIRE) ×2 IMPLANT
HEMOSTAT POWDER SURGIFOAM 1G (HEMOSTASIS) ×6 IMPLANT
INSERT CONFORM CROSS CLAMP 66M (MISCELLANEOUS) ×2 IMPLANT
INSERT CONFORM CROSS CLAMP 86M (MISCELLANEOUS) ×2 IMPLANT
INSERT FOGARTY XLG (MISCELLANEOUS) ×1 IMPLANT
KIT BASIN OR (CUSTOM PROCEDURE TRAY) ×2 IMPLANT
KIT DILATOR VASC 18G NDL (KITS) ×2 IMPLANT
KIT DRAINAGE VACCUM ASSIST (KITS) ×1 IMPLANT
KIT ROOM TURNOVER OR (KITS) ×2 IMPLANT
KIT SUCTION CATH 14FR (SUCTIONS) ×4 IMPLANT
LEAD PACING MYOCARDI (MISCELLANEOUS) ×2 IMPLANT
LINE VENT (MISCELLANEOUS) ×1 IMPLANT
NDL AORTIC ROOT 14G 7F (CATHETERS) ×1 IMPLANT
NEEDLE AORTIC ROOT 14G 7F (CATHETERS) ×2 IMPLANT
NS IRRIG 1000ML POUR BTL (IV SOLUTION) ×10 IMPLANT
OPTISITE ARTERIAL PERFUSION CANNULA ×1 IMPLANT
PACK OPEN HEART (CUSTOM PROCEDURE TRAY) ×2 IMPLANT
PAD ARMBOARD 7.5X6 YLW CONV (MISCELLANEOUS) ×2 IMPLANT
PAD ELECT DEFIB RADIOL ZOLL (MISCELLANEOUS) ×2 IMPLANT
PAIN PUMP ON-Q 400MLX5ML 5IN (MISCELLANEOUS) IMPLANT
RAP FEMORAL VENOUS CANNULA 22 FR ×1 IMPLANT
RETRACTOR TRL SOFT TISSUE LG (INSTRUMENTS) IMPLANT
RETRACTOR TRM SOFT TISSUE 7.5 (INSTRUMENTS) ×1 IMPLANT
RING MITRAL MEMO 3D 32MM SMD32 (Prosthesis & Implant Heart) ×2 IMPLANT
SET CANNULATION TOURNIQUET (MISCELLANEOUS) ×2 IMPLANT
SET CARDIOPLEGIA MPS 5001102 (MISCELLANEOUS) ×1 IMPLANT
SET IRRIG TUBING LAPAROSCOPIC (IRRIGATION / IRRIGATOR) ×2 IMPLANT
SOLUTION ANTI FOG 6CC (MISCELLANEOUS) ×2 IMPLANT
SPONGE GAUZE 4X4 12PLY (GAUZE/BANDAGES/DRESSINGS) ×2 IMPLANT
SPONGE LAP 18X18 X RAY DECT (DISPOSABLE) ×9 IMPLANT
SPONGE LAP 4X18 X RAY DECT (DISPOSABLE) ×6 IMPLANT
SUCKER INTRACARDIAC WEIGHTED (SUCKER) ×1 IMPLANT
SUCKER WEIGHTED FLEX (MISCELLANEOUS) ×4 IMPLANT
SURGIFLO TRUKIT (HEMOSTASIS) ×2 IMPLANT
SUT BONE WAX W31G (SUTURE) ×3 IMPLANT
SUT ETHIBOND (SUTURE) ×2 IMPLANT
SUT ETHIBOND 2 0 SH (SUTURE) ×3 IMPLANT
SUT ETHIBOND 2 0 SH 36X2 (SUTURE) ×1 IMPLANT
SUT ETHIBOND 2 0 V4 (SUTURE) IMPLANT
SUT ETHIBOND 2 0V4 GREEN (SUTURE) IMPLANT
SUT ETHIBOND 2-0 RB-1 WHT (SUTURE) ×2 IMPLANT
SUT ETHIBOND 4 0 TF (SUTURE) IMPLANT
SUT ETHIBOND 5 0 C 1 30 (SUTURE) IMPLANT
SUT ETHIBOND NAB MH 2-0 36IN (SUTURE) ×2 IMPLANT
SUT ETHIBOND X763 2 0 SH 1 (SUTURE) ×3 IMPLANT
SUT GORETEX 6.0 TH-9 30 IN (SUTURE) IMPLANT
SUT GORETEX CV 4 TH 22 36 (SUTURE) ×2 IMPLANT
SUT GORETEX CV-5THC-13 36IN (SUTURE) ×8 IMPLANT
SUT GORETEX CV4 TH-18 (SUTURE) ×5 IMPLANT
SUT GORETEX TH-18 36 INCH (SUTURE) IMPLANT
SUT MNCRL AB 3-0 PS2 18 (SUTURE) ×6 IMPLANT
SUT PDS AB 1 CTX 36 (SUTURE) ×3 IMPLANT
SUT PROLENE 3 0 SH DA (SUTURE) ×15 IMPLANT
SUT PROLENE 3 0 SH1 36 (SUTURE) ×12 IMPLANT
SUT PROLENE 4 0 RB 1 (SUTURE) ×4
SUT PROLENE 4-0 RB1 .5 CRCL 36 (SUTURE) ×1 IMPLANT
SUT PROLENE 5 0 C 1 36 (SUTURE) ×4 IMPLANT
SUT PROLENE 6 0 C 1 30 (SUTURE) ×5 IMPLANT
SUT SILK  1 MH (SUTURE) ×10
SUT SILK 1 MH (SUTURE) ×9 IMPLANT
SUT SILK 1 TIES 10X30 (SUTURE) ×2 IMPLANT
SUT SILK 2 0 SH CR/8 (SUTURE) ×3 IMPLANT
SUT SILK 2 0 TIES 10X30 (SUTURE) ×2 IMPLANT
SUT SILK 2 0SH CR/8 30 (SUTURE) ×4 IMPLANT
SUT SILK 3 0 (SUTURE) ×2
SUT SILK 3 0 SH CR/8 (SUTURE) ×2 IMPLANT
SUT SILK 3 0SH CR/8 30 (SUTURE) ×2 IMPLANT
SUT SILK 3-0 18XBRD TIE 12 (SUTURE) ×1 IMPLANT
SUT STEEL STERNAL CCS#1 18IN (SUTURE) ×1 IMPLANT
SUT STEEL SZ 6 DBL 3X14 BALL (SUTURE) ×2 IMPLANT
SUT TEM PAC WIRE 2 0 SH (SUTURE) ×4 IMPLANT
SUT VIC AB 2-0 CTX 36 (SUTURE) ×4 IMPLANT
SUT VIC AB 2-0 UR6 27 (SUTURE) ×4 IMPLANT
SUT VIC AB 3-0 SH 8-18 (SUTURE) ×6 IMPLANT
SUT VICRYL 2 TP 1 (SUTURE) ×2 IMPLANT
SYRINGE 10CC LL (SYRINGE) ×2 IMPLANT
SYSTEM SAHARA CHEST DRAIN ATS (WOUND CARE) ×2 IMPLANT
TAPE CLOTH SURG 4X10 WHT LF (GAUZE/BANDAGES/DRESSINGS) ×2 IMPLANT
TOWEL OR 17X24 6PK STRL BLUE (TOWEL DISPOSABLE) ×3 IMPLANT
TOWEL OR 17X26 10 PK STRL BLUE (TOWEL DISPOSABLE) ×3 IMPLANT
TRAY FOLEY IC TEMP SENS 14FR (CATHETERS) ×2 IMPLANT
TROCAR XCEL BLADELESS 5X75MML (TROCAR) ×2 IMPLANT
TROCAR XCEL NON-BLD 11X100MML (ENDOMECHANICALS) ×4 IMPLANT
TUBE SUCT INTRACARD DLP 20F (MISCELLANEOUS) ×2 IMPLANT
TUNNELER SHEATH ON-Q 11GX8 (MISCELLANEOUS) IMPLANT
UNDERPAD 30X30 INCONTINENT (UNDERPADS AND DIAPERS) ×2 IMPLANT
WATER STERILE IRR 1000ML POUR (IV SOLUTION) ×4 IMPLANT
WIRE BENTSON .035X145CM (WIRE) ×2 IMPLANT

## 2011-10-01 NOTE — H&P (View-Only) (Signed)
[name], MD  09/15/2011  6:02 PM  Signed  PCP is Thomos Lemons, DO, DO Referring Provider is [place], Peter, MD    Chief Complaint   Patient presents with   .  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 For years he has been followed here locally by Dr. Patty Sermons. The patient reports that he was in his usual state of health until approximately 3 weeks ago when he developed new onset of exertional shortness of breath. An echocardiogram was performed 08/22/2011 demonstrating severe mitral regurgitation. Previous echocardiogram had demonstrated only mild mitral regurgitation. The patient subsequently was scheduled for elective left and right heart catheterization. This was performed October 25 and notable for the presence of normal coronary artery anatomy with no significant coronary artery disease. There was normal left ventricular systolic function with severe mitral regurgitation and moderate pulmonary hypertension. The patient has been referred for possible elective mitral valve repair.     Past Medical History   Diagnosis  Date   .  Prostate cancer     .  Melanoma         Left Shoulder   .  Vision abnormalities         Cornea scarring   .  Osteoarthritis         Knees   .  Hypertension     .  Pure hypercholesterolemia     .  Esophageal reflux     .  Depressive disorder, not elsewhere classified     .  MVP (mitral valve prolapse)     .  Personal history of colonic polyps     .  Valvular heart disease         Last echo in 2010 with EF 55 to 60%, +MVP, mild MR, diastolic dysfunction, biatrial enlargement, mild AI and mild pulmonary HTN   .  Normal nuclear stress test  2007   .  Thrombocytopenia     .  Mitral regurgitation         Past Surgical History   Procedure  Date   .  Nuclear stress test  09/2006       EF-64%, Normal   .  US  echocardiography  09/2009       mild LVH,mild AI,MVP with mild MR, mild-mod. TR with mild Pulm. HTN, EF-55-60%   .  Inguinal hernia repair  09/2009       Left   .  Knee arthroscopy         x 2   .  Colonoscopy w/ polypectomy     .  Prostatectomy  2003   .  Rotator cuff repair  2003       left   .  Melanoma surgery         2001, 2005, 2006, 2009       Family History   Problem  Relation  Age of Onset   .  Clotting disorder  Brother         CVA's   .  Arthritis  Mother     .  Hypertension  Mother     .  Hypertension  Father     .  Psychosis  Father         psychiatric care   .  Stroke  Mother  Social History History   Substance Use Topics   .  Smoking status:  Never Smoker    .  Smokeless tobacco:  Not on file   .  Alcohol Use:  No       Current Outpatient Prescriptions   Medication  Sig  Dispense  Refill   .  acetaminophen (TYLENOL) 500 MG tablet  Take 500 mg by mouth every 6 (six) hours as needed.           Marland Kitchen  aspirin 81 MG tablet  Take 81 mg by mouth daily.           Marland Kitchen  atorvastatin (LIPITOR) 10 MG tablet  Take 1 tablet (10 mg total) by mouth daily.   90 tablet   3   .  celecoxib (CELEBREX) 200 MG capsule  Take 200 mg by mouth daily.           Marland Kitchen  lisinopril (PRINIVIL) 10 MG tablet  Take 1 tablet (10 mg total) by mouth daily.   30 tablet   5   .  metoprolol (TOPROL-XL) 100 MG 24 hr tablet  Take 1 tablet (100 mg total) by mouth daily.   90 tablet   3   .  RESTASIS 0.05 % ophthalmic emulsion  Place 1 drop into both eyes every 12 (twelve) hours.          .  TRAZODONE HCL PO  Take 300 mg by mouth at bedtime.             No Known Allergies  Review of Systems  Constitutional: Negative for fever, chills, activity change, appetite change, fatigue and unexpected weight change.  HENT: Negative.   Eyes:        Some chronic double vision and blurry vision left eye  Respiratory: Positive for shortness of breath. Negative for cough, choking, chest tightness and wheezing.     Cardiovascular: Positive for palpitations and leg swelling. Negative for chest pain.        Recent onset exertional shortness of breath and orthopnea.  Shortness of breath occurs with moderate activity.  No resting shortness of breath.  Gastrointestinal:       Occasional constipation  Genitourinary: Positive for frequency.  Musculoskeletal: Positive for arthralgias.        Chronic pain and decreased mobility in both knees  Neurological: Positive for numbness.        Chronic numbness and mild pain in both feet related to peripheral neuropathy  Hematological:       Recent mild thrombocytopenia without any history of bleeding  Psychiatric/Behavioral:        Depression    BP 127/76  Pulse 75  Resp 16  Ht 6\' 5"  (1.956 m)  Wt 202 lb (91.627 kg)  BMI 23.95 kg/m2  SpO2 97% Physical Exam  Vitals reviewed. Constitutional: He is oriented to person, place, and time. He appears well-developed and well-nourished.  HENT:   Head: Normocephalic.  Eyes: Pupils are equal, round, and reactive to light.  Neck: Normal range of motion. Neck supple. No JVD present. No thyromegaly present.  Cardiovascular: Normal rate and regular rhythm.    Murmur heard.      Loud grade IV/VI holosystolic murmur heard all across the precordium  Pulmonary/Chest: Effort normal and breath sounds normal. He has no wheezes. He has no rales.  Abdominal: Bowel sounds are normal. He exhibits no mass. There is no tenderness.  Musculoskeletal: He exhibits edema. He exhibits no tenderness.  Lymphadenopathy:  He has no cervical adenopathy.  Neurological: He is alert and oriented to person, place, and time.  Skin: Skin is warm and dry.  Psychiatric: He has a normal mood and affect. His behavior is normal. Judgment and thought content normal.     Diagnostic Tests:  Transthoracic echocardiogram performed 08/21/2011 is reviewed. This demonstrates severe mitral regurgitation. The jet of regurgitation is eccentric and directed  posteriorly. There is some thickening of the mitral valve leaflets. The posterior leaflet is not well visualized. It is difficult to tell for certain the functional anatomy, but based upon the jet of regurgitation I would suspect the presence of possible flail segment of the anterior leaflet. Nevertheless, the functional anatomy is not clearly identified. Left ventricular systolic function is normal. There is at least moderate tricuspid regurgitation. There is mild aortic insufficiency. Transesophageal echocardiogram would be helpful to better ascertain the functional anatomy associated with the patient's severe mitral regurgitation.  Left and right heart catheterization performed 09/11/2011 is reviewed. This demonstrates normal coronary artery anatomy with no significant coronary artery disease. The descending thoracic and abdominal aorta and iliac vessels are free of any significant aortoiliac disease. There is moderate pulmonary hypertension with PA pressures measured 47/18 with mean wedge pressure of 23 and a large V waves consistent with severe mitral regurgitation. Cardiac output was 5.8 L per minute corresponding to a cardiac index of 2.7.  Transesophageal echocardiogram was performed last week by Dr. Olga Millers. This confirmed the presence of severe mitral regurgitation. There was type II dysfunction with obvious flail segment of the middle scallop of the anterior leaflet of the mitral valve. The posterior leaflet appears relatively small and possibly restricted. The jet of regurgitation coursing posteriorly around the left atrium. There was moderate tricuspid regurgitation. No other significant abnormalities were noted.    Impression:  The patient has severe mitral regurgitation with recent onset of symptoms of exertional shortness of breath and orthopnea. He has moderate pulmonary hypertension with normal left ventricular systolic function. There is no coronary artery disease. I agree that  mitral valve repair is indicated. The patient has mild thrombocytopenia but platelet counts have been stable. The patient otherwise appears to be a relatively good candidate for surgical intervention and should be a good candidate for use of the minimally invasive approach for surgery.   Plan:  I have discussed matters at length with the patient and his daughter here in the office today.  The rationale for elective mitral valve repair surgery has been explained, including a comparison between surgery and continued medical therapy with close follow-up.  The likelihood of successful and durable valve repair has been discussed with particular reference to the findings of their recent echocardiogram. I explained to them the reason why transesophageal echocardiogram might be helpful to better establish a plan for valve repair and give them a better idea the likelihood that his valve should be repairable with an excellent durable result.  In the unlikely event that their valve cannot be successfully repaired, we discussed the possibility of replacing the mitral valve using a mechanical prosthesis with the attendant need for long-term anticoagulation versus the alternative of replacing it using a bioprosthetic tissue valve with its potential for late structural valve deterioration and failure, depending upon the patient's longevity.  If the patient's mitral valve cannot be successfully repaired, he seems inclined to wish to have his valve replaced using a bioprosthetic tissue valve in effort to avoid the need for anticoagulation. Alternative surgical approaches have been discussed, including  a comparison between conventional sternotomy and minimally-invasive techniques.  The relative risks and benefits of each have been reviewed as they pertain to the patient's specific circumstances, and all of their questions have been addressed.   Alternative surgical approaches have been discussed, including a comparison between  conventional sternotomy and minimally-invasive techniques.  The relative risks and benefits of each have been reviewed as they pertain to the patient's specific circumstances, and all of their questions have been addressed.  The patient and his daughter were counseled at length regarding the indications, risks and potential benefits of surgery.  Alternative treatment strategies were discussed. They understand and accept all potential risks of surgery including but not limited to risk of death, stroke or other neurologic complication, myocardial infarction, congestive heart failure, respiratory failure, renal failure, bleeding requiring transfusion and/or reexploration, arrhythmia, infection or other wound complications, pneumonia, pleural and/or pericardial effusion, pulmonary embolus, aortic dissection or other major vascular complication, or delayed complications related to valve repair or replacement.  All of their questions have been answered.  We spent in excess of 30 minutes during consultation.

## 2011-10-01 NOTE — Interval H&P Note (Signed)
History and Physical Interval Note:   10/01/2011   7:49 AM   [name] [name]  has presented today for surgery, with the diagnosis of Mitral Regurgitation  The various methods of treatment have been discussed with the patient and family. After consideration of risks, benefits and other options for treatment, the patient has consented to  Procedure(s): MINIMALLY INVASIVE MITRAL VALVE REPAIR (MVR) as a surgical intervention .  The patients' history has been reviewed, patient examined, no change in status, stable for surgery.  I have reviewed the patients' chart and labs.  Questions were answered to the patient's satisfaction.     [name]  MD  The patient returns for elective surgery today.  There have been no interval changes to their history as previously documented.  I have seen and briefly examined the patient in the preoperative holding area, and there are no changes to report in comparison with their previously documented exam.  All labs and diagnostic tests have been reviewed.  We will proceed with surgery as planned.   [name],[name]

## 2011-10-01 NOTE — Progress Notes (Signed)
                   [address]            [place] [address]          [phone]        Warming ,volume abministered and Norepi off,low dose pressors. Chest tubes < 200/hr,PEEP 8 . Coags not greatly abnormal. ABG pending

## 2011-10-01 NOTE — Anesthesia Postprocedure Evaluation (Signed)
  Anesthesia Post-op Note  Patient: [name] [name]  Procedure(s) Performed:  MINIMALLY INVASIVE MITRAL VALVE REPAIR (MVR) - MINIMALLY INVASIVE MITRAL VALVE REPAIR (MVR) [777], and sternotomy   Patient Location: PACU  Anesthesia Type: General  Level of Consciousness: awake  Airway and Oxygen Therapy: Patient Spontanous Breathing  Post-op Pain: mild  Post-op Assessment: Post-op Vital signs reviewed  Post-op Vital Signs: stable  Complications: No apparent anesthesia complications

## 2011-10-01 NOTE — Transfer of Care (Signed)
Immediate Anesthesia Transfer of Care Note  Patient: [name] [name]  Procedure(s) Performed:  MINIMALLY INVASIVE MITRAL VALVE REPAIR (MVR) - MINIMALLY INVASIVE MITRAL VALVE REPAIR (MVR) [777], and sternotomy   Patient Location: PACU and SICU  Anesthesia Type: General  Level of Consciousness: sedated  Airway & Oxygen Therapy: Patient remains intubated per anesthesia plan  Post-op Assessment: Post -op Vital signs reviewed and stable  Post vital signs: Reviewed and stable  Complications: No apparent anesthesia complications

## 2011-10-01 NOTE — Brief Op Note (Signed)
10/01/2011  2:14 PM  PATIENT:  [name] [name]  77 y.o. 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  Severe coagulopathy after separation from CPB requiring tranfusion multiple blood products.  PATIENT CONDITION:  ICU - intubated and hemodynamically stable.  PRE-OPERATIVE WEIGHT: 92 kg

## 2011-10-01 NOTE — Op Note (Signed)
CARDIOTHORACIC SURGERY OPERATIVE NOTE  Date of Procedure: 10/01/2011  Preoperative Diagnosis: Severe Mitral Regurgitation  Postoperative Diagnosis: Same  Procedure:    Mitral Valve Repair  Complex valvuloplasty including artificial Gore-tex neochord placement x4, chordal transposition x1, chordal release x1  # 32 mm Sorin Memo 3D Ring Annuloplasty    Surgeon: Salvatore Decent. Cornelius Moras, MD Assistant: Doree Fudge, PA-C Anesthesia: Judie Petit, MD  Operative Findings:  severe (4+) mitral regurgitation  Type II dysfunction with flail anterior leaflet  Type IIIA dysfunction with restricted posterior leaflet (likely congenital)  normal left ventricular systolic function  Moderate (2+) tricuspid regurgitation with type I dysfunction (functional)  No residual MR following successful valve repair  Conversion from right mini-thoracotomy to median sternotomy following separation from CPB due to bleeding from perforated coronary sinus  Severe coagulopathy after reversal of heparin   BRIEF CLINICAL NOTE AND INDICATIONS FOR SURGERY  The patient is a 77 year old with long-standing history of mitral valve prolapse diagnosed at age 31. The patient has developed recent onset of progressive exertional shortness of breath. Transthoracic and transesophageal echocardiograms demonstrate flail anterior leaflet of the mitral valve with severe mitral regurgitation. Cardiac catheterization demonstrates normal coronary artery anatomy with no significant coronary artery disease. The patient presents for elective mitral valve repair and provides full informed consent for the procedure as described.   DETAILS OF THE OPERATIVE PROCEDURE  The patient is brought to the operating room on the above mentioned date and central monitoring was established by the anesthesia team including placement of Swan-Ganz catheter through the left internal jugular vein.  A radial arterial line is placed. The patient  is placed in the supine position on the operating table.  Intravenous antibiotics are administered. General endotracheal anesthesia is induced uneventfully. The patient is initially intubated using a dual lumen endotracheal tube.  A Foley catheter is placed.  Baseline transesophageal echocardiogram was performed.  Findings were notable for obvious flail middle scallop of the anterior leaflet with multiple ruptured primary cords and severe mitral regurgitation. The jet of regurgitation courses posteriorly around the left atrium. There is left atrial enlargement. There is normal left ventricular systolic function. There is trivial aortic insufficiency. There is moderate tricuspid regurgitation. No other significant abnormalities were noted.  A soft roll is placed behind the patient's left scapula and the neck gently extended and turned to the left.   The patient's right neck, chest, abdomen, both groins, and both lower extremities are prepared and draped in a sterile manner. A time out procedure is performed.  A small incision is made in the right inguinal crease and the anterior surface of the right common femoral artery and right common femoral vein are identified.  A right miniature anterolateral thoracotomy incision is performed. The incision is placed just lateral to and superior to the right nipple. The pectoralis major muscle is retracted medially and completely preserved. The right pleural space is entered through the 4th intercostal space. A soft tissue retractor is placed.  Two 11 mm ports are placed through separate stab incisions inferiorly. The right pleural space is insufflated continuously with carbon dioxide gas through the posterior port during the remainder of the operation.  A pledgeted sutures placed through the dome of the right hemidiaphragm and retracted inferiorly to facilitate exposure.  A longitudinal incision is made in the pericardium 3 cm anterior to the phrenic nerve and silk  traction sutures are placed on either side of the incision for exposure.  The patient is placed in Trendelenburg position. The  right internal jugular vein is cannulated with Seldinger technique and a guidewire advanced into the right atrium. The patient is heparinized systemically. The right internal jugular vein is cannulated with a 14 [place] pediatric femoral venous cannula. Pursestring sutures are placed on the anterior surface of the right common femoral vein and right common femoral artery. The right common femoral vein is cannulated with the Seldinger technique and a guidewire is advanced under transesophageal echocardiogram guidance through the right atrium. The femoral vein is cannulated with a long 22 French femoral venous cannula. The right common femoral artery is cannulated with Seldinger technique and a flexible guidewire is advanced until it can be appreciated intraluminally in the descending thoracic aorta on transesophageal echocardiogram. The femoral artery is cannulated with an 18 French femoral arterial cannula.  Adequate heparinization is verified.     The entire pre-bypass portion of the operation was notable for stable hemodynamics.  Cardiopulmonary bypass was begun.  Vacuum assist venous drainage is utilized. The incision in the pericardium is extended in both directions. Venous drainage and exposure are notably excellent. A retrograde cardioplegia cannula is placed through the right atrium into the coronary sinus using transesophageal echocardiogram guidance.  An antegrade cardioplegia cannula is placed in the ascending aorta.    The patient is cooled to 28C systemic temperature.  The aortic cross clamp is applied and cold blood cardioplegia is delivered initially in an antegrade fashion through the aortic root.  The initial cardioplegic arrest is rapid with early diastolic arrest.  Attempted use of the retrograde cardioplegia cannula was notable for no sign of increased of pressure,  suggesting that cannula had become dislodged. Repeat doses of cardioplegia are administered intermittently every 20 to 30 minutes throughout the entire cross clamp portion of the operation through the aortic root in order to maintain completely flat electrocardiogram.  Myocardial protection was felt to be excellent.  A left atriotomy incision was performed through the interatrial groove and extended partially across the back wall of the left atrium after opening the oblique sinus inferiorly.  The mitral valve is exposed using a self-retaining retractor.  The mitral valve was inspected and notable for obvious multiple ruptured cords to the middle scallop of the anterior leaflet. The atrial leaflet was somewhat thickened. Many of the chordae tendineae were somewhat thickened but there was no foreshortening of the subvalvular apparatus. The posterior leaflet was somewhat restricted particularly in the P1 P2 region. There was some very thickened cords associated with this portion of the posterior leaflet. There was no significant calcification in the annulus but there was calcification of the anterolateral papillary muscle. .  Interrupted 2-0 Ethibond horizontal mattress sutures are placed circumferentially around the entire mitral valve annulus. The sutures will ultimately be utilized for ring annuloplasty, and at this juncture there are utilized to suspend the valve symmetrically.  Artificial Gore-Tex cords are utilized to treat the flail portion of the anterior leaflet as well as chordal transposition. A single secondary cord from the undersurface of the anterior leaflet is amputated and transposed to the free margin. This cord is attached to the anterolateral papillary muscle. It is reattached to the free margin of the anterior leaflet using CV 5 Gore-Tex suture.  Two pledgeted CV 4 Gore-Tex sutures are placed. One is placed in horizontal mattress fashion through the head of the anterolateral papillary muscle,  and the second is placed through the head of the posterior medial papillary muscle.  The 2 corporal Gore-Tex strands from the suture placed in the  anterolateral papillary muscle are then woven through the anterior leaflet on the side of a 2 towards the anterolateral commissure. Each suture is initially placed from the ventricular to the atrial surface at the free margin of the leaflet and then woven in a diamond-shaped fashion towards the anterior annulus. The 2 strands from the other Gore-Tex suture placed to the posterior medial papillary muscle are placed into the anterior leaflet in a similar fashion on the side of a 2 towards the posterior medial commissure. The ventricle is filled with saline and the Gore-Tex sutures are tied to the appropriate length all the valve is completely pressurized.  The valve now appears competent on saline testing. The valve was sized to accept a 32 mm annuloplasty ring.  A Sorin Memo 3D annuloplasty ring (size 32mm, model #[id number], serial [id number]) is secured in place uneventfully.  The valve is again tested with saline and appears to be perfectly competent with a broad symmetrical line of coaptation of the anterior and posterior leaflet. There is no residual leak. Rewarming is begun.  The atriotomy was closed using a 2-layer closure of running 3-0 Prolene suture after placing a sump drain across the mitral valve to serve as a left ventricular vent.  One final dose of warm retrograde "hot shot" cardioplegia was administered retrograde through the coronary sinus catheter while all air was evacuated through the aortic root.  The aortic cross clamp was removed after a total cross clamp time of 141 minutes.  Epicardial pacing wires are fixed to the inferior wall of the right ventricule and to the right atrial appendage. The patient is rewarmed to 37C temperature. The left ventricular vent is removed.  The patient is ventilated and flow volumes turndown while the mitral valve  repair is inspected using transesophageal echocardiogram. The valve repair appears intact with no residual leak. The antegrade cardioplegia cannula is now removed. The patient is weaned and disconnected from cardiopulmonary bypass.  The patient's rhythm at separation from bypass was atrial paced.  The patient was weaned from bypass on dopamine at 3 mcg/kg/min. Total cardiopulmonary bypass time for the operation was 211 minutes at this juncture.  Followup transesophageal echocardiogram performed after separation from bypass revealed a well-seated annuloplasty ring in the mitral position with a normal functioning mitral valve. There was no residual leak.  Left ventricular function was unchanged from preoperatively.  The femoral arterial and venous cannulae were removed uneventfully. There was a palpable pulse in the distal right common femoral artery after removal of the cannula. Protamine was administered to reverse the anticoagulation. The right internal jugular cannula was removed and manual pressure held on the neck for 15 minutes.  Single lung ventilation was begun.   The atriotomy closure was inspected for hemostasis.  There was no sign of bleeding from the atriotomy closure, but there was continuous dark blood emanating from the posterior surface of the heart. This could not be adequately exposed. Subsequently, a median sternotomy incision was performed. The patient was placed in Trendelenburg position. On exposure of the posterior wall one could appreciate a perforation of the coronary sinus that most of occurred do to placement of coronary sinus catheter for retrograde cardioplegia. The patient did not tolerate exposure for possible repair of this perforation without use of cardioplegia bypass.  The patient was heparinized systemically. The a setting aorta was cannulated. The right atrium was cannulated. Cardioplegia bypass was begun. The perforation of the coronary sinus was repaired using a patch of  autologous pericardium. After the  repair was completed, an extensive search for other possible sizes the patient was performed.  The patient was then weaned and separated from cardioplegic bypass. Additional cardioplegia bypass time was 39 minutes making the grand total duration of cardioplegia bypass for the entire operation 250 minutes.  The patient was weaned from cardioplegic bypass on norepinephrine, no redundant, and dopamine infusions. Repeat transesophageal echocardiogram performed after separation from bypass demonstrates normal left ventricular systolic function. The mitral valve repair remains intact. No other significant abnormalities were noted.  Protamine was administered to reverse the anticoagulation and the aortic and venous cannula were removed uneventfully. There was severe coagulopathy. An exhaustive search for sites of mechanical bleeding was performed. The patient was transfused multiple blood products and resuscitated in the operating room until the coagulopathy seemed to resolve.  The mediastinum was drained using 2 chest tubes placed through separate stab incisions inferiorly.  A left pleural chest tube was placed.  The pericardium was closed using a patch of core matrix bovine submucosal tissue patch. The right pleural space is irrigated with saline solution and inspected for hemostasis. The right pleural space was drained using two chest tubes placed through the port incisions. The miniature thoracotomy incision was closed in multiple layers in routine fashion. The right groin incision was inspected for hemostasis and closed in multiple layers in routine fashion.  The median sternotomy was closed using double strength sternal wire in the soft tissues anterior to the sternum were closed in multiple layers. All skin incisions were closed with subcuticular skin closures.  The patient tolerated the procedure remarkably well under the circumstances.  The patient was reintubated using a  single lumen endotracheal tube and subsequently transported to the surgical intensive care unit in stable condition. There were no intraoperative complications. All sponge instrument and needle counts are verified correct at completion of the operation.    Salvatore Decent. Cornelius Moras MD

## 2011-10-01 NOTE — Anesthesia Preprocedure Evaluation (Signed)
Anesthesia Evaluation  Patient identified by MRN, date of birth, ID band Patient awake    Airway       Dental   Pulmonary          Cardiovascular hypertension, + Valvular Problems/Murmurs     Neuro/Psych    GI/Hepatic GERD-  ,  Endo/Other    Renal/GU      Musculoskeletal   Abdominal   Peds  Hematology   Anesthesia Other Findings   Reproductive/Obstetrics                           Anesthesia Physical Anesthesia Plan  ASA: III  Anesthesia Plan: General   Post-op Pain Management:    Induction: Intravenous  Airway Management Planned: Double Lumen EBT  Additional Equipment:   Intra-op Plan:   Post-operative Plan: Post-operative intubation/ventilation  Informed Consent: I have reviewed the patients History and Physical, chart, labs and discussed the procedure including the risks, benefits and alternatives for the proposed anesthesia with the patient or authorized representative who has indicated his/her understanding and acceptance.   Dental advisory given  Plan Discussed with:   Anesthesia Plan Comments:         Anesthesia Quick Evaluation

## 2011-10-01 NOTE — OR Nursing (Signed)
Sternotomy performed at 1510.

## 2011-10-01 NOTE — Anesthesia Procedure Notes (Addendum)
Procedure Name: Intubation Date/Time: 10/01/2011 9:07 AM Performed by: Coralee Rud Pre-anesthesia Checklist: Patient identified, Emergency Drugs available, Suction available, Patient being monitored and Timeout performed Patient Re-evaluated:Patient Re-evaluated prior to inductionOxygen Delivery Method: Circle System Utilized Preoxygenation: Pre-oxygenation with 100% oxygen Intubation Type: IV induction Ventilation: Oral airway inserted - appropriate to patient size and Mask ventilation without difficulty Laryngoscope Size: Mac and 4 Grade View: Grade I Tube type: Oral Endobronchial tube: Left, Double lumen EBT, EBT position confirmed by auscultation and EBT position confirmed by fiberoptic bronchoscope and 41 Fr Number of attempts: 1 Airway Equipment and Method: oral airway and stylet Placement Confirmation: ETT inserted through vocal cords under direct vision,  positive ETCO2 and breath sounds checked- equal and bilateral Secured at: 41 cm Tube secured with: Tape Dental Injury: Teeth and Oropharynx as per pre-operative assessment     Procedures: Leftt IJ Theone Murdoch Catheter Insertion: [id number]. The patient was identified and consent obtained.  TO was performed, and full barrier precautions were used.  The skin was anesthetized with lidocaine-4cc plain with 25g needle.  Once the vein was located with the 22 ga. needle using ultrasound guidance , the wire was inserted into the vein.  The wire location was confirmed with ultrasound.  The tissue was dilated and the 8.5 [place] cordis catheter was carefully inserted. Afterwards Theone Murdoch catheter was inserted. PA catheter at 55cm.  The patient tolerated the procedure well. There was no difficulty

## 2011-10-02 ENCOUNTER — Inpatient Hospital Stay (HOSPITAL_COMMUNITY): Payer: Medicare Other

## 2011-10-02 DIAGNOSIS — I059 Rheumatic mitral valve disease, unspecified: Secondary | ICD-10-CM

## 2011-10-02 LAB — PREPARE PLATELET PHERESIS
Unit division: 0
Unit division: 0
Unit division: 0

## 2011-10-02 LAB — BASIC METABOLIC PANEL
Calcium: 10 mg/dL (ref 8.4–10.5)
Creatinine, Ser: 0.8 mg/dL (ref 0.50–1.35)
GFR calc non Af Amer: 83 mL/min — ABNORMAL LOW (ref >90)
Glucose, Bld: 151 mg/dL — ABNORMAL HIGH (ref 70–99)
Sodium: 145 mEq/L (ref 135–145)

## 2011-10-02 LAB — GLUCOSE, CAPILLARY
Glucose-Capillary: 107 mg/dL — ABNORMAL HIGH (ref 70–99)
Glucose-Capillary: 142 mg/dL — ABNORMAL HIGH (ref 70–99)
Glucose-Capillary: 147 mg/dL — ABNORMAL HIGH (ref 70–99)
Glucose-Capillary: 171 mg/dL — ABNORMAL HIGH (ref 70–99)
Glucose-Capillary: 183 mg/dL — ABNORMAL HIGH (ref 70–99)
Glucose-Capillary: 192 mg/dL — ABNORMAL HIGH (ref 70–99)

## 2011-10-02 LAB — POCT I-STAT, CHEM 8
BUN: 21 mg/dL (ref 6–23)
Hemoglobin: 8.8 g/dL — ABNORMAL LOW (ref 13.0–17.0)
Sodium: 143 mEq/L (ref 135–145)
TCO2: 22 mmol/L (ref 0–100)

## 2011-10-02 LAB — PREPARE FRESH FROZEN PLASMA
Unit division: 0
Unit division: 0
Unit division: 0
Unit division: 0

## 2011-10-02 LAB — POCT I-STAT 3, ART BLOOD GAS (G3+)
Acid-Base Excess: 3 mmol/L — ABNORMAL HIGH (ref 0.0–2.0)
Acid-base deficit: 1 mmol/L (ref 0.0–2.0)
Acid-base deficit: 3 mmol/L — ABNORMAL HIGH (ref 0.0–2.0)
Bicarbonate: 22 mEq/L (ref 20.0–24.0)
Bicarbonate: 21.6 mEq/L (ref 20.0–24.0)
O2 Saturation: 93 %
O2 Saturation: 90 %
O2 Saturation: 93 %
Patient temperature: 36
TCO2: 23 mmol/L (ref 0–100)
pCO2 arterial: 33 mmHg — ABNORMAL LOW (ref 35.0–45.0)
pO2, Arterial: 55 mmHg — ABNORMAL LOW (ref 80.0–100.0)
pO2, Arterial: 56 mmHg — ABNORMAL LOW (ref 80.0–100.0)

## 2011-10-02 LAB — PREPARE CRYOPRECIPITATE: Unit division: 0

## 2011-10-02 LAB — CBC
Hemoglobin: 8.8 g/dL — ABNORMAL LOW (ref 13.0–17.0)
Hemoglobin: 9.5 g/dL — ABNORMAL LOW (ref 13.0–17.0)
MCH: 29.7 pg (ref 26.0–34.0)
MCH: 30.1 pg (ref 26.0–34.0)
MCHC: 35.5 g/dL (ref 30.0–36.0)
MCHC: 35.3 g/dL (ref 30.0–36.0)
Platelets: 40 10*3/uL — ABNORMAL LOW (ref 150–400)

## 2011-10-02 LAB — CREATININE, SERUM
Creatinine, Ser: 1.05 mg/dL (ref 0.50–1.35)
GFR calc non Af Amer: 66 mL/min — ABNORMAL LOW (ref >90)

## 2011-10-02 LAB — MAGNESIUM: Magnesium: 1.9 mg/dL (ref 1.5–2.5)

## 2011-10-02 MED ORDER — INSULIN GLARGINE 100 UNIT/ML ~~LOC~~ SOLN
20.0000 [IU] | Freq: Every day | SUBCUTANEOUS | Status: DC
  Administered 2011-10-02 – 2011-10-05 (×4): 20 [IU] via SUBCUTANEOUS
  Filled 2011-10-02: qty 3

## 2011-10-02 MED ORDER — POTASSIUM CHLORIDE 10 MEQ/50ML IV SOLN
10.0000 meq | INTRAVENOUS | Status: AC
  Administered 2011-10-02 (×3): 10 meq via INTRAVENOUS

## 2011-10-02 MED ORDER — SODIUM CHLORIDE 0.9 % IV SOLN
INTRAVENOUS | Status: DC | PRN
  Filled 2011-10-02: qty 1

## 2011-10-02 MED ORDER — FUROSEMIDE 10 MG/ML IJ SOLN
40.0000 mg | Freq: Once | INTRAMUSCULAR | Status: AC
  Administered 2011-10-02: 40 mg via INTRAVENOUS
  Filled 2011-10-02: qty 4

## 2011-10-02 MED ORDER — ALBUMIN HUMAN 5 % IV SOLN: 12.5000 g | Freq: Once | INTRAVENOUS | Status: AC

## 2011-10-02 MED ORDER — FUROSEMIDE 10 MG/ML IJ SOLN
INTRAMUSCULAR | Status: AC
  Administered 2011-10-02: 40 mg via INTRAVENOUS
  Filled 2011-10-02: qty 4

## 2011-10-02 MED ORDER — INSULIN ASPART 100 UNIT/ML ~~LOC~~ SOLN
0.0000 [IU] | SUBCUTANEOUS | Status: DC
  Administered 2011-10-02 – 2011-10-03 (×3): 4 [IU] via SUBCUTANEOUS

## 2011-10-02 MED ORDER — INSULIN ASPART 100 UNIT/ML ~~LOC~~ SOLN
0.0000 [IU] | SUBCUTANEOUS | Status: DC
  Administered 2011-10-02: 2 [IU] via SUBCUTANEOUS
  Administered 2011-10-02: 8 [IU] via SUBCUTANEOUS
  Administered 2011-10-02: 4 [IU] via SUBCUTANEOUS
  Administered 2011-10-03 (×4): 2 [IU] via SUBCUTANEOUS
  Administered 2011-10-03: 4 [IU] via SUBCUTANEOUS
  Administered 2011-10-03 – 2011-10-04 (×2): 2 [IU] via SUBCUTANEOUS
  Administered 2011-10-04: 4 [IU] via SUBCUTANEOUS
  Administered 2011-10-05: 2 [IU] via SUBCUTANEOUS
  Filled 2011-10-02: qty 3

## 2011-10-02 MED ORDER — ALBUMIN HUMAN 5 % IV SOLN
INTRAVENOUS | Status: AC
  Administered 2011-10-02: 12.5 g via INTRAVENOUS
  Filled 2011-10-02: qty 500

## 2011-10-02 MED ORDER — ALBUMIN HUMAN 5 % IV SOLN
INTRAVENOUS | Status: AC
  Administered 2011-10-02 (×2): 250 mL via INTRAVENOUS
  Filled 2011-10-02: qty 500

## 2011-10-02 MED ORDER — FUROSEMIDE 10 MG/ML IJ SOLN
40.0000 mg | Freq: Once | INTRAMUSCULAR | Status: AC
  Administered 2011-10-02: 40 mg via INTRAVENOUS

## 2011-10-02 NOTE — Progress Notes (Addendum)
Subjective:   [name] [name] is a 77 yo gentleman with severe MR - s/p minimally invasive MV repair.  He is extubated.  PA pressures are moderately elevated      . acetaminophen (TYLENOL) oral liquid 160 mg/5 mL  650 mg Per Tube NOW   Or  . acetaminophen  650 mg Rectal NOW  . acetaminophen  1,000 mg Oral Q6H   Or  . acetaminophen (TYLENOL) oral liquid 160 mg/5 mL  975 mg Per Tube Q6H  . albumin human  12.5 g Intravenous Once  . albumin human  12.5 g Intravenous Once  . albumin human  12.5 g Intravenous Once  . albumin human  12.5 g Intravenous Once  . amiodarone  150 mg Intravenous Once  . aspirin EC  325 mg Oral Daily   Or  . aspirin  324 mg Per Tube Daily  . bisacodyl  10 mg Oral Daily   Or  . bisacodyl  10 mg Rectal Daily  . cefUROXime (ZINACEF) IV  1.5 g Intravenous To OR  . cefUROXime (ZINACEF) IV  1.5 g Intravenous Q12H  . cycloSPORINE  1 drop Both Eyes Q12H  . dextrose      . docusate sodium  200 mg Oral Daily  . famotidine (PEPCID) IV  20 mg Intravenous Q12H  . furosemide      . furosemide  40 mg Intravenous Once  . insulin aspart  0-24 Units Subcutaneous Q4H  . insulin aspart  0-24 Units Subcutaneous Q4H  . magnesium sulfate infusion  4 g Intravenous Once  . metoprolol tartrate  12.5 mg Oral BID   Or  . metoprolol tartrate  12.5 mg Per Tube BID  . pantoprazole  40 mg Oral Q1200  . potassium chloride  10 mEq Intravenous Q1 Hr x 3  . potassium chloride  10 mEq Intravenous Q1 Hr x 3  . simvastatin  20 mg Oral q1800  . sodium chloride   Both Eyes QHS  . sodium chloride  3 mL Intravenous Q12H  . vancomycin (VANCOCIN) IVPB 1000 mg/100 mL central line  1,000 mg Intravenous Once  . DISCONTD: aminocaproic acid (AMICAR) for OHS   Intravenous To OR  . DISCONTD: aminocaproic acid (AMICAR) IVPB  5 g Intravenous Once  . DISCONTD: amiodarone (CORDARONE) bolus  150 mg Intravenous Once  . DISCONTD: amiodarone (CORDARONE) bolus  150 mg Intravenous Once  . DISCONTD: amiodarone   200 mg Oral BID  . DISCONTD: cefUROXime (ZINACEF) IV  750 mg Intravenous To OR  . DISCONTD: chlorhexidine  30 mL Topical UD  . DISCONTD: coagulation factor VIIa recomb  90 mcg/kg Intravenous To OR  . DISCONTD: coagulation factor VIIa recomb  90 mcg/kg Intravenous To OR  . DISCONTD: dexmedetomidine (PRECEDEX) IV infusion for high rates  0.1-0.7 mcg/kg/hr Intravenous To OR  . DISCONTD: DOPamine  2-20 mcg/kg/min Intravenous To OR  . DISCONTD: epinephrine  0.5-20 mcg/min Intravenous To OR  . DISCONTD: insulin aspart  0-24 Units Subcutaneous Q4H  . DISCONTD: insulin (NOVOLIN-R) infusion   Intravenous To OR  . DISCONTD: magnesium sulfate  40 mEq Other To OR  . DISCONTD: metoprolol tartrate  12.5 mg Oral Once  . DISCONTD: metoprolol tartrate  12.5 mg Oral Once  . DISCONTD: nitroGLYCERIN  2-200 mcg/min Intravenous To OR  . DISCONTD: phenylephrine (NEO-SYNEPHRINE) Adult infusion  30-200 mcg/min Intravenous To OR  . DISCONTD: potassium chloride  80 mEq Other To OR  . DISCONTD: sodium chloride   Both Eyes QHS  .  DISCONTD: vancomycin  1,250 mg Intravenous To OR      . sodium chloride 20 mL/hr at 10/01/11 2045  . sodium chloride 20 mL/hr at 10/01/11 2045  . sodium chloride    . amiodarone (CORDARONE) infusion 60 mg/hr (10/02/11 0200)   Followed by  . amiodarone (CORDARONE) infusion 30 mg/hr (10/02/11 0800)  . dexmedetomidine (PRECEDEX) IV infusion Stopped (10/02/11 0045)  . DOPamine 3 mcg/kg/min (10/02/11 0800)  . insulin (NOVOLIN-R) infusion Stopped (10/02/11 0100)  . insulin (NOVOLIN-R) infusion    . insulin (NOVOLIN-R) infusion    . lactated ringers 20 mL/hr at 10/01/11 2201  . milrinone 0.109 mcg/kg/min (10/02/11 0800)  . nitroGLYCERIN Stopped (10/01/11 2045)  . norepinephrine (LEVOPHED) Adult infusion Stopped (10/02/11 0645)  . phenylephrine (NEO-SYNEPHRINE) Adult infusion 15 mcg/min (10/02/11 0900)  . DISCONTD: amiodarone (CORDARONE) infusion 60 mg/hr (10/01/11 2100)  . DISCONTD:  amiodarone (CORDARONE) infusion      Objective:  Vital Signs in the last 24 hours: Blood pressure 123/61, pulse 90, temperature 98.4 F (36.9 C), temperature source Oral, resp. rate 21, height 6\' 5"  (1.956 m), weight 200 lb 9.9 oz (91 kg), SpO2 92.00%. Temp:  [94.5 F (34.7 C)-98.4 F (36.9 C)] 98.4 F (36.9 C) (11/15 1159) Pulse Rate:  [77-107] 90  (11/15 1025) Resp:  [0-30] 21  (11/15 1025) BP: (87-123)/(58-78) 123/61 mmHg (11/15 1025) SpO2:  [90 %-100 %] 92 % (11/15 1025) FiO2 (%):  [39.7 %-100 %] 40.1 % (11/15 0938) Weight:  [200 lb 9.9 oz (91 kg)] 200 lb 9.9 oz (91 kg) (11/14 2045)  Intake/Output from previous day: 11/14 0701 - 11/15 0700 In: 28312.1 [I.V.:9971.1; Blood:13701; NG/GT:90; IV Piggyback:4550] Out: 16109 [Urine:6860; Blood:3250; Chest Tube:1180] Intake/Output from this shift: Total I/O In: 230.3 [I.V.:126.3; IV Piggyback:104] Out: 85 [Urine:35; Chest Tube:50]  Physical Exam: The patient is alert and oriented x 3.  The mood and affect are normal.   Skin: warm and dry.  Color is normal.    HEENT:   the sclera are nonicteric.  The mucous membranes are moist.  The carotids are 2+ without bruits.  There is no thyromegaly.  There is no JVD.    Lungs: clear   Heart: regular rate with a normal S1 and S2.  There are no murmurs, gallops, or rubs. The PMI is not displaced.     Abdomen: good bowel sounds.  There is no guarding or rebound.  There is no hepatosplenomegaly or tenderness.  There are no masses.   Extremities:  no clubbing, cyanosis, or edema.  The legs are without rashes.  The distal pulses are intact.   Neuro:  Cranial nerves II - XII are intact.  Motor and sensory functions are intact.     Lab Results:  [facility] 10/02/11 0246 10/01/11 2035  WBC 12.8* 12.3*  HGB 8.8* 10.5*  PLT 79* 98*    Basename 10/02/11 0246 10/01/11 2029  NA 145 143  K 3.5 3.2*  CL 109 --  CO2 22 --  GLUCOSE 151* 92  BUN 15 --  CREATININE 0.80 --   No results found  for this basename: TROPONINI:2,CK,MB:2 in the last 72 hours No results found for this basename: BNP in the last 72 hours Hepatic Function Panel No results found for this basename: PROT,ALBUMIN,AST,ALT,ALKPHOS,BILITOT,BILIDIR,IBILI in the last 72 hours Lab Results  Component Value Date   CHOL 148 04/01/2011   HDL 39.60 04/01/2011   LDLCALC 93 04/01/2011   TRIG 76.0 04/01/2011   CHOLHDL 4 04/01/2011   No  results found for this basename: PROTIME in the last 72 hours   V paced  Assessment/Plan:  .1. Mitral valve repair :   Slow progress.  PA pressures are moderately elevated. Continue current Rx.       Vesta Mixer, Montez Hageman., MD, [facility] 10/02/2011, 12:25 PM

## 2011-10-02 NOTE — Progress Notes (Signed)
Extubation Note Pt awake and alert. Pt extubated per protocol to 5L [place], sats 92%. Positive cuff leak, NIF -20, VC 1.0, IS 500. Pt able to vocalize. No complications or problems noted.

## 2011-10-02 NOTE — Progress Notes (Signed)
   CARDIOTHORACIC SURGERY PROGRESS NOTE   R1 Day Post-Op Procedure(s) (LRB): MINIMALLY INVASIVE MITRAL VALVE REPAIR (MVR) (Right)  Subjective: Awake and alert on vent.  Follows simple commands but by report unable to perform extubation parameters (NIF, etc).  Looks ready to extubate  Objective: Vital signs: Filed Vitals:   10/02/11 0736  BP:   Pulse:   Temp: 97.2 F (36.2 C)  Resp:     Hemodynamics: PAP: (31-49)/(17-29) 38/17 mmHg CO:  [3.8 L/min-5.2 L/min] 4.1 L/min CI:  [1.7 L/min/m2-2.3 L/min/m2] 1.9 L/min/m2  Physical Exam:  Rhythm:   A-paced  Breath sounds: Clear, symmetrical  Heart sounds:  RRR, no murmur  Incisions:  Dressings intact  Abdomen:  Soft, non-distended  Extremities:  Warm, well-perfused   Intake/Output from previous day: 11/14 0701 - 11/15 0700 In: 27670.4 [I.V.:9329.4; Blood:13701; NG/GT:90; IV Piggyback:4550] Out: 09811 [Urine:6860; Blood:3250; Chest Tube:1180] Intake/Output this shift:    Lab Results:  Basename 10/02/11 0246 10/01/11 2035  WBC 12.8* 12.3*  HGB 8.8* 10.5*  HCT 24.8* 30.0*  PLT 79* 98*   BMET:  Basename 10/02/11 0246 10/01/11 2029 09/29/11 1141  NA 145 143 --  K 3.5 3.2* --  CL 109 -- 104  CO2 22 -- 23  GLUCOSE 151* 92 --  BUN 15 -- 26*  CREATININE 0.80 -- 1.15  CALCIUM 10.0 -- 9.9    CBG (last 3)   Basename 10/02/11 0312 10/02/11 0209 10/02/11 0103  GLUCAP 147* 142* 127*   ABG    Component Value Date/Time   PHART 7.420 10/02/2011 0650   HCO3 21.6 10/02/2011 0650   TCO2 23 10/02/2011 0650   ACIDBASEDEF 3.0* 10/02/2011 0650   O2SAT 90.0 10/02/2011 0650     Assessment/Plan: S/P Procedure(s) (LRB): MINIMALLY INVASIVE MITRAL VALVE REPAIR (MVR) (Right)  Stable hemodynamics Expected post-op acute blood loss anemia Expected post-op volume excess Marginal oxygenation  Will give IV lasix x1 and proceed with acute vent wean, extubation Wean drips as tolerated   [name] [name]

## 2011-10-02 NOTE — Clinical Documentation Improvement (Signed)
GENERIC DOCUMENTATION CLARIFICATION QUERY  Please update your documentation within the medical record to reflect your response to this query.                                                                                         10/02/11    Dear Dr.Lakira Ogando / Associates,  In a better effort to capture your patient's severity of illness, reflect appropriate length of stay and utilization of resources, a review of the patient medical record has revealed the following indicators.    Based on your clinical judgment, please clarify and document in a progress note and/or discharge summary the clinical condition associated with the following supporting information:  In responding to this query please exercise your independent judgment.  The fact that a query is asked, does not imply that any particular answer is desired or expected.  Possible Clinical Conditions?  _ Right Pleural Effusion   _ Atelectasis  _Other Condition  _Cannot Clinically Determine     Supporting Information: Diagnostics:  CXR results 11/14:  Small volume Right Pleural Effusion; Atelectasis.   You may use possible, probable, or suspect with inpatient documentation. possible, probable, suspected diagnoses MUST be documented at the time of discharge  Reviewed:  no additional documentation provided  Thank You,  Sincerely, Marciano Sequin  Clinical Documentation Specialist:  Pager: [phone]  Health Information Management [place]  Please refer all similar questions to PA on call

## 2011-10-02 NOTE — Progress Notes (Signed)
UR Completed.  [name] [name] 10/02/2011  

## 2011-10-03 ENCOUNTER — Inpatient Hospital Stay (HOSPITAL_COMMUNITY): Payer: Medicare Other

## 2011-10-03 DIAGNOSIS — I340 Nonrheumatic mitral (valve) insufficiency: Secondary | ICD-10-CM | POA: Diagnosis present

## 2011-10-03 LAB — BASIC METABOLIC PANEL
Calcium: 9.6 mg/dL (ref 8.4–10.5)
GFR calc non Af Amer: 59 mL/min — ABNORMAL LOW (ref >90)
Glucose, Bld: 133 mg/dL — ABNORMAL HIGH (ref 70–99)
Sodium: 143 mEq/L (ref 135–145)

## 2011-10-03 LAB — GLUCOSE, CAPILLARY: Glucose-Capillary: 123 mg/dL — ABNORMAL HIGH (ref 70–99)

## 2011-10-03 LAB — CBC
MCH: 29.9 pg (ref 26.0–34.0)
Platelets: 48 10*3/uL — ABNORMAL LOW (ref 150–400)
RBC: 3.35 MIL/uL — ABNORMAL LOW (ref 4.22–5.81)

## 2011-10-03 MED ORDER — DEXTROSE 5 % IV SOLN
150.0000 mg | Freq: Once | INTRAVENOUS | Status: AC
  Administered 2011-10-03: 150 mg via INTRAVENOUS
  Filled 2011-10-03: qty 3

## 2011-10-03 MED ORDER — AMIODARONE HCL 200 MG PO TABS
200.0000 mg | ORAL_TABLET | Freq: Two times a day (BID) | ORAL | Status: DC
  Administered 2011-10-03 (×2): 200 mg via ORAL
  Filled 2011-10-03 (×4): qty 1

## 2011-10-03 MED ORDER — WARFARIN SODIUM 2.5 MG PO TABS
2.5000 mg | ORAL_TABLET | Freq: Every day | ORAL | Status: DC
  Administered 2011-10-03 – 2011-10-06 (×4): 2.5 mg via ORAL
  Filled 2011-10-03 (×5): qty 1

## 2011-10-03 MED ORDER — FUROSEMIDE 10 MG/ML IJ SOLN
8.0000 mg/h | INTRAVENOUS | Status: DC
  Administered 2011-10-03 – 2011-10-04 (×3): 8 mg/h via INTRAVENOUS
  Filled 2011-10-03 (×3): qty 25

## 2011-10-03 MED FILL — Heparin Sodium (Porcine) Inj 1000 Unit/ML: INTRAMUSCULAR | Qty: 120 | Status: AC

## 2011-10-03 MED FILL — Sodium Chloride IV Soln 0.9%: INTRAVENOUS | Qty: 2000 | Status: AC

## 2011-10-03 MED FILL — Sodium Chloride Irrigation Soln 0.9%: Qty: 6000 | Status: AC

## 2011-10-03 MED FILL — Electrolyte-R (PH 7.4) Solution: INTRAVENOUS | Qty: 7000 | Status: AC

## 2011-10-03 NOTE — Progress Notes (Signed)
Subjective:   [name] [name] is a 77 yo gentleman with severe MR - s/p minimally invasive MV repair.  He is extubated.   PA pressures are moderately elevated  He's doing better today. He's sitting in the chair. He still complains of having chest soreness and tenderness. He still has multiple chest tubes in.      Marland Kitchen acetaminophen  1,000 mg Oral Q6H   Or  . acetaminophen (TYLENOL) oral liquid 160 mg/5 mL  975 mg Per Tube Q6H  . aspirin EC  325 mg Oral Daily   Or  . aspirin  324 mg Per Tube Daily  . bisacodyl  10 mg Oral Daily   Or  . bisacodyl  10 mg Rectal Daily  . cefUROXime (ZINACEF)  IV  1.5 g Intravenous Q12H  . cycloSPORINE  1 drop Both Eyes Q12H  . docusate sodium  200 mg Oral Daily  . famotidine (PEPCID) IV  20 mg Intravenous Q12H  . insulin aspart  0-24 Units Subcutaneous Q4H  . insulin aspart  0-24 Units Subcutaneous Q4H  . insulin glargine  20 Units Subcutaneous QHS  . metoprolol tartrate  12.5 mg Oral BID   Or  . metoprolol tartrate  12.5 mg Per Tube BID  . pantoprazole  40 mg Oral Q1200  . simvastatin  20 mg Oral q1800  . sodium chloride   Both Eyes QHS  . sodium chloride  3 mL Intravenous Q12H      . sodium chloride 20 mL/hr at 10/02/11 1900  . sodium chloride 20 mL/hr at 10/01/11 2045  . sodium chloride    . amiodarone (CORDARONE) infusion 30 mg/hr (10/03/11 0100)  . dexmedetomidine (PRECEDEX) IV infusion Stopped (10/02/11 0045)  . DOPamine 2 mcg/kg/min (10/03/11 0700)  . insulin (NOVOLIN-R) infusion Stopped (10/02/11 0100)  . insulin (NOVOLIN-R) infusion    . insulin (NOVOLIN-R) infusion    . lactated ringers 20 mL/hr at 10/02/11 1900  . milrinone Stopped (10/02/11 1200)  . nitroGLYCERIN Stopped (10/01/11 2045)  . norepinephrine (LEVOPHED) Adult infusion Stopped (10/02/11 0645)  . phenylephrine (NEO-SYNEPHRINE) Adult infusion Stopped (10/02/11 1000)    Objective:  Vital Signs in the last 24 hours: Blood pressure 107/60, pulse 72, temperature 97.7 F  (36.5 C), temperature source Oral, resp. rate 20, height 6\' 5"  (1.956 m), weight 216 lb 11.4 oz (98.3 kg), SpO2 96.00%. Temp:  [97 F (36.1 C)-99.9 F (37.7 C)] 97.7 F (36.5 C) (11/16 0729) Pulse Rate:  [67-90] 72  (11/16 0700) Resp:  [7-31] 20  (11/16 0700) BP: (93-136)/(50-84) 107/60 mmHg (11/16 0700) SpO2:  [85 %-96 %] 96 % (11/16 0700) FiO2 (%):  [39.8 %-40.2 %] 39.8 % (11/15 1000) Weight:  [216 lb 11.4 oz (98.3 kg)] 216 lb 11.4 oz (98.3 kg) (11/16 0600)  Intake/Output from previous day: 11/15 0701 - 11/16 0700 In: 2137.8 [I.V.:2031.8; IV Piggyback:106] Out: 4510 [Urine:2930; Chest Tube:1580] Intake/Output from this shift:    Physical Exam: The patient is alert and oriented x 3.  The mood and affect are normal.   Skin: warm and dry.    HEENT:   the sclera are nonicteric.  The mucous membranes are moist.  The carotids are 2+ without bruits.  There is no thyromegaly.  There is no JVD.    Lungs: There are a few scattered rhonchi.  Heart: regular rate with a normal S1 and S2.  There is a pleural pericardial friction rub.  Abdomen: good bowel sounds.  There is no guarding or rebound.  There is no hepatosplenomegaly or tenderness.  There are no masses.   Extremities:  Trace edema especially in the right leg.  Neuro:  Cranial nerves II - XII are intact.  Motor and sensory functions are intact.     Lab Results:  Basename 10/03/11 0426 10/02/11 1732 10/02/11 1700  WBC 18.8* -- 16.6*  HGB 10.0* 8.8* --  PLT 48* -- 40*    Basename 10/03/11 0426 10/02/11 1732 10/02/11 0246  NA 143 143 --  K 4.2 4.4 --  CL 107 106 --  CO2 27 -- 22  GLUCOSE 133* 195* --  BUN 26* 21 --  CREATININE 1.15 1.10 --   No results found for this basename: TROPONINI:2,CK,MB:2 in the last 72 hours No results found for this basename: BNP in the last 72 hours Hepatic Function Panel No results found for this basename: PROT,ALBUMIN,AST,ALT,ALKPHOS,BILITOT,BILIDIR,IBILI in the last 72 hours Lab  Results  Component Value Date   CHOL 148 04/01/2011   HDL 39.60 04/01/2011   LDLCALC 93 04/01/2011   TRIG 76.0 04/01/2011   CHOLHDL 4 04/01/2011   No results found for this basename: PROTIME in the last 72 hours   Telemetry:  Sinus rhythm.  There is a first-degree AV block.  Assessment/Plan:  1. Mitral valve repair :   Slow progress.  PA pressures are moderately elevated. Continue current Rx.    Vesta Mixer, Montez Hageman., MD, [facility] 10/03/2011, 8:43 AM

## 2011-10-03 NOTE — Progress Notes (Signed)
   CARDIOTHORACIC SURGERY PROGRESS NOTE   R2 Days Post-Op Procedure(s) (LRB): MINIMALLY INVASIVE MITRAL VALVE REPAIR (MVR) (Right)  Subjective: Some dyspnea but otherwise feels okay.  Very mild soreness. Ate breakfast.  Objective: Vital signs: Filed Vitals:   10/03/11 0729  BP:   Pulse:   Temp: 97.7 F (36.5 C)  Resp:     Hemodynamics: PAP: (36-37)/(14-15) 37/15 mmHg  Physical Exam:  Rhythm:   sinus  Breath sounds: Fairly clear  Heart sounds:  RRR no murmur  Incisions:  Dressings intact  Abdomen:  Soft, non-tender  Extremities:  Warm, well-perfused   Intake/Output from previous day: 11/15 0701 - 11/16 0700 In: 2137.8 [I.V.:2031.8; IV Piggyback:106] Out: 4510 [Urine:2930; Chest Tube:1580] Intake/Output this shift:    Lab Results:  Basename 10/03/11 0426 10/02/11 1732 10/02/11 1700  WBC 18.8* -- 16.6*  HGB 10.0* 8.8* --  HCT 28.9* 26.0* --  PLT 48* -- 40*   BMET:  Basename 10/03/11 0426 10/02/11 1732 10/02/11 0246  NA 143 143 --  K 4.2 4.4 --  CL 107 106 --  CO2 27 -- 22  GLUCOSE 133* 195* --  BUN 26* 21 --  CREATININE 1.15 1.10 --  CALCIUM 9.6 -- 10.0    CBG (last 3)   Basename 10/03/11 0731 10/03/11 0455 10/02/11 1950  GLUCAP 123* 123* 171*   ABG    Component Value Date/Time   PHART 7.500* 10/02/2011 1023   HCO3 26.5* 10/02/2011 1023   TCO2 22 10/02/2011 1732   ACIDBASEDEF 3.0* 10/02/2011 0650   O2SAT 93.0 10/02/2011 1023     Assessment/Plan: S/P Procedure(s) (LRB): MINIMALLY INVASIVE MITRAL VALVE REPAIR (MVR) (Right)  Stable POD #3 Postop volume overload Stable postop acute blood loss anemia Brief episode postop AF on night of surgery, none since on Amiodarone  Mobilize D/C large chest tubes Start lasix drip Wean dopamine off Start coumadin  Tanylah Schnoebelen H   Marshun Duva H

## 2011-10-03 NOTE — Progress Notes (Signed)
TCTS BRIEF SICU PROGRESS NOTE  2 Days Post-Op  S/P Procedure(s) (LRB): MINIMALLY INVASIVE MITRAL VALVE REPAIR (MVR) (Right)   Feels well but complains that he didn't walk today. Stable all day but now back in AF with HR 70's BP 102/68 Diuresing well on lasix  Assessment/Plan:  Will give extra Amiodarone bolus Mobilize  [name],[name]

## 2011-10-04 ENCOUNTER — Inpatient Hospital Stay (HOSPITAL_COMMUNITY): Payer: Medicare Other

## 2011-10-04 DIAGNOSIS — I4891 Unspecified atrial fibrillation: Secondary | ICD-10-CM | POA: Diagnosis not present

## 2011-10-04 LAB — GLUCOSE, CAPILLARY
Glucose-Capillary: 101 mg/dL — ABNORMAL HIGH (ref 70–99)
Glucose-Capillary: 151 mg/dL — ABNORMAL HIGH (ref 70–99)

## 2011-10-04 LAB — BASIC METABOLIC PANEL
BUN: 37 mg/dL — ABNORMAL HIGH (ref 6–23)
GFR calc Af Amer: 68 mL/min — ABNORMAL LOW (ref >90)
GFR calc non Af Amer: 58 mL/min — ABNORMAL LOW (ref >90)
Potassium: 3.2 mEq/L — ABNORMAL LOW (ref 3.5–5.1)

## 2011-10-04 LAB — CBC
HCT: 31.7 % — ABNORMAL LOW (ref 39.0–52.0)
MCHC: 33.8 g/dL (ref 30.0–36.0)
RDW: 16.1 % — ABNORMAL HIGH (ref 11.5–15.5)

## 2011-10-04 LAB — PROTIME-INR
INR: 1.31 (ref 0.00–1.49)
Prothrombin Time: 16.5 seconds — ABNORMAL HIGH (ref 11.6–15.2)

## 2011-10-04 MED ORDER — AMIODARONE HCL 200 MG PO TABS
400.0000 mg | ORAL_TABLET | Freq: Two times a day (BID) | ORAL | Status: DC
  Administered 2011-10-04 – 2011-10-13 (×20): 400 mg via ORAL
  Filled 2011-10-04 (×22): qty 2

## 2011-10-04 MED ORDER — POTASSIUM CHLORIDE 10 MEQ/50ML IV SOLN
10.0000 meq | INTRAVENOUS | Status: AC
  Administered 2011-10-04 (×3): 10 meq via INTRAVENOUS

## 2011-10-04 MED ORDER — POTASSIUM CHLORIDE 10 MEQ/50ML IV SOLN
INTRAVENOUS | Status: AC
  Filled 2011-10-04: qty 50

## 2011-10-04 MED ORDER — POTASSIUM CHLORIDE 10 MEQ/50ML IV SOLN
INTRAVENOUS | Status: AC
  Administered 2011-10-04: 10 meq via INTRAVENOUS
  Filled 2011-10-04: qty 50

## 2011-10-04 MED ORDER — POTASSIUM CHLORIDE 10 MEQ/50ML IV SOLN
INTRAVENOUS | Status: AC
  Administered 2011-10-04: 10 meq
  Filled 2011-10-04: qty 50

## 2011-10-04 NOTE — Progress Notes (Signed)
   CARDIOTHORACIC SURGERY PROGRESS NOTE   R3 Days Post-Op Procedure(s) (LRB): MINIMALLY INVASIVE MITRAL VALVE REPAIR (MVR) (Right)  Subjective: Feels better today.  SOB improved. Soreness improved. No BM yet  Objective: Vital signs: Filed Vitals:   10/04/11 1100  BP: 101/63  Pulse: 89  Temp:   Resp: 21    Hemodynamics:    Physical Exam:  Rhythm:   Sinus with intermittent AF  Breath sounds: Clear, some subQ air on right  Heart sounds:  RRR no murmur  Incisions:  dry  Abdomen:  soft  Extremities:  Warm, mild edema   Intake/Output from previous day: 11/16 0701 - 11/17 0700 In: 1808.3 [P.O.:1080; I.V.:678.3; IV Piggyback:50] Out: 5455 [Urine:4625; Chest Tube:830] Intake/Output this shift: Total I/O In: 92 [I.V.:92] Out: 960 [Urine:930; Chest Tube:30]  Lab Results:  [facility] 10/04/11 0445 10/03/11 0426  WBC 19.2* 18.8*  HGB 10.7* 10.0*  HCT 31.7* 28.9*  PLT 39* 48*   BMET:  Basename 10/04/11 0445 10/03/11 0426  NA 142 143  K 3.2* 4.2  CL 101 107  CO2 31 27  GLUCOSE 119* 133*  BUN 37* 26*  CREATININE 1.16 1.15  CALCIUM 9.1 9.6    CBG (last 3)   Basename 10/04/11 0802 10/04/11 0413 10/03/11 2314  GLUCAP 102* 111* 152*   ABG    Component Value Date/Time   PHART 7.500* 10/02/2011 1023   HCO3 26.5* 10/02/2011 1023   TCO2 22 10/02/2011 1732   ACIDBASEDEF 3.0* 10/02/2011 0650   O2SAT 93.0 10/02/2011 1023   INR 1.3  Assessment/Plan: S/P Procedure(s) (LRB): MINIMALLY INVASIVE MITRAL VALVE REPAIR (MVR) (Right)  Doing much better POD3 Still in and out of AF with stable HR Volume excess, diuresing well on lasix drip Small right PTX with subQ air on right Chest tube output markedly decreased  D/c mediastinal and left pleural tubes, leave right tube in Mobilize Continue lasix drip Continue coumadin Continue amiodarone Transfer stepdown  [employer]

## 2011-10-04 NOTE — Progress Notes (Signed)
PT refused for Foley catheter to be removed due to limited mobility and receiving Lasix gtt.

## 2011-10-04 NOTE — Progress Notes (Signed)
Subjective:   [name] [name] is a 77 yo gentleman with severe MR - s/p minimally invasive MV repair.  He is extubated.   He's doing better today. He's sitting in the chair. He still complains of having chest soreness and tenderness. He still has multiple chest tubes in.  The patient went into A-Fib during the evening of 11/16.  He had been started on PO Amio prior to MV surgery and had been on an Amio drip until yesterday.  He is on coumadin .  No heparin .  He still has significant bloody chest tube drainage.  He is comfortable - no worsening dyspnea.        Marland Kitchen acetaminophen  1,000 mg Oral Q6H   Or  . acetaminophen (TYLENOL) oral liquid 160 mg/5 mL  975 mg Per Tube Q6H  . amiodarone  150 mg Intravenous Once  . amiodarone  200 mg Oral BID  . aspirin EC  325 mg Oral Daily   Or  . aspirin  324 mg Per Tube Daily  . bisacodyl  10 mg Oral Daily   Or  . bisacodyl  10 mg Rectal Daily  . cefUROXime (ZINACEF)  IV  1.5 g Intravenous Q12H  . cycloSPORINE  1 drop Both Eyes Q12H  . docusate sodium  200 mg Oral Daily  . insulin aspart  0-24 Units Subcutaneous Q4H  . insulin aspart  0-24 Units Subcutaneous Q4H  . insulin glargine  20 Units Subcutaneous QHS  . metoprolol tartrate  12.5 mg Oral BID   Or  . metoprolol tartrate  12.5 mg Per Tube BID  . pantoprazole  40 mg Oral Q1200  . potassium chloride      . potassium chloride      . potassium chloride      . simvastatin  20 mg Oral q1800  . sodium chloride   Both Eyes QHS  . sodium chloride  3 mL Intravenous Q12H  . warfarin  2.5 mg Oral q1800      . sodium chloride 20 mL/hr at 10/02/11 1900  . sodium chloride 20 mL/hr at 10/01/11 2045  . sodium chloride    . DOPamine Stopped (10/03/11 1850)  . furosemide (LASIX) infusion 8 mg/hr (10/03/11 2000)  . insulin (NOVOLIN-R) infusion Stopped (10/02/11 0100)  . insulin (NOVOLIN-R) infusion    . insulin (NOVOLIN-R) infusion    . DISCONTD: amiodarone (CORDARONE) infusion Stopped (10/03/11 1500)   . DISCONTD: dexmedetomidine (PRECEDEX) IV infusion Stopped (10/02/11 0045)  . DISCONTD: lactated ringers 20 mL/hr at 10/02/11 1900  . DISCONTD: milrinone Stopped (10/02/11 1200)  . DISCONTD: nitroGLYCERIN Stopped (10/01/11 2045)  . DISCONTD: norepinephrine (LEVOPHED) Adult infusion Stopped (10/02/11 0645)  . DISCONTD: phenylephrine (NEO-SYNEPHRINE) Adult infusion Stopped (10/02/11 1000)    Objective:  Vital Signs in the last 24 hours: Blood pressure 112/57, pulse 85, temperature 97.7 F (36.5 C), temperature source Oral, resp. rate 24, height 6\' 5"  (1.956 m), weight 212 lb 4.9 oz (96.3 kg), SpO2 97.00%. Temp:  [97.4 F (36.3 C)-97.8 F (36.6 C)] 97.7 F (36.5 C) (11/17 0400) Pulse Rate:  [62-100] 85  (11/17 0700) Resp:  [14-32] 24  (11/17 0700) BP: (88-136)/(46-84) 112/57 mmHg (11/17 0700) SpO2:  [91 %-99 %] 97 % (11/17 0700) Weight:  [212 lb 4.9 oz (96.3 kg)] 212 lb 4.9 oz (96.3 kg) (11/17 0700)  Intake/Output from previous day: 11/16 0701 - 11/17 0700 In: 1808.3 [P.O.:1080; I.V.:678.3; IV Piggyback:50] Out: 5455 [Urine:4625; Chest Tube:830] Intake/Output from this shift:  Physical Exam: The patient is alert and oriented x 3.  The mood and affect are normal.   Skin: warm and dry.    HEENT:   the sclera are nonicteric.  The mucous membranes are moist.  The carotids are 2+ without bruits.  There is no thyromegaly.  There is no JVD.    Lungs: There are a few scattered rhonchi.  Heart: regular rate with a normal S1 and S2.  There is a pleural/ pericardial friction rub.  Abdomen: good bowel sounds.  There is no guarding or rebound.  There is no hepatosplenomegaly or tenderness.  There are no masses.   Extremities:  Trace edema especially in the right leg.  Neuro:  Cranial nerves II - XII are intact.  Motor and sensory functions are intact.     Lab Results:  [facility] 10/04/11 0445 10/03/11 0426  WBC 19.2* 18.8*  HGB 10.7* 10.0*  PLT 39* 48*    Basename 10/04/11  0445 10/03/11 0426  NA 142 143  K 3.2* 4.2  CL 101 107  CO2 31 27  GLUCOSE 119* 133*  BUN 37* 26*  CREATININE 1.16 1.15   No results found for this basename: TROPONINI:2,CK,MB:2 in the last 72 hours No results found for this basename: BNP in the last 72 hours Hepatic Function Panel No results found for this basename: PROT,ALBUMIN,AST,ALT,ALKPHOS,BILITOT,BILIDIR,IBILI in the last 72 hours Lab Results  Component Value Date   CHOL 148 04/01/2011   HDL 39.60 04/01/2011   LDLCALC 93 04/01/2011   TRIG 76.0 04/01/2011   CHOLHDL 4 04/01/2011   No results found for this basename: PROTIME in the last 72 hours  Chest tube drainage : bloody drainage  Telemetry:  underlying Atrial fib. Ventricular pacing at 90  Assessment/Plan:  1. Mitral valve repair :   Slow progress.    2.  Atrial Fibrillation:  The patient is on low dose Amio.  Will increase Amiodarone to 400 mg BID.  He is presently comfortable.  Continue coumadin.  Will have the surgeons decide whether or not he needs heparin.   Vesta Mixer, Montez Hageman., MD, [facility] 10/04/2011, 7:56 AM

## 2011-10-05 ENCOUNTER — Inpatient Hospital Stay (HOSPITAL_COMMUNITY): Payer: Medicare Other

## 2011-10-05 LAB — TYPE AND SCREEN
Unit division: 0
Unit division: 0
Unit division: 0
Unit division: 0
Unit division: 0
Unit division: 0
Unit division: 0

## 2011-10-05 LAB — CBC
HCT: 33.6 % — ABNORMAL LOW (ref 39.0–52.0)
MCV: 89.6 fL (ref 78.0–100.0)
RBC: 3.75 MIL/uL — ABNORMAL LOW (ref 4.22–5.81)
WBC: 15.7 10*3/uL — ABNORMAL HIGH (ref 4.0–10.5)

## 2011-10-05 LAB — BASIC METABOLIC PANEL
BUN: 40 mg/dL — ABNORMAL HIGH (ref 6–23)
CO2: 30 mEq/L (ref 19–32)
Chloride: 94 mEq/L — ABNORMAL LOW (ref 96–112)
Creatinine, Ser: 1.03 mg/dL (ref 0.50–1.35)
Glucose, Bld: 226 mg/dL — ABNORMAL HIGH (ref 70–99)

## 2011-10-05 LAB — GLUCOSE, CAPILLARY: Glucose-Capillary: 144 mg/dL — ABNORMAL HIGH (ref 70–99)

## 2011-10-05 MED ORDER — POTASSIUM CHLORIDE CRYS ER 20 MEQ PO TBCR
20.0000 meq | EXTENDED_RELEASE_TABLET | Freq: Two times a day (BID) | ORAL | Status: DC
  Administered 2011-10-06 – 2011-10-08 (×5): 20 meq via ORAL
  Filled 2011-10-05 (×8): qty 1

## 2011-10-05 MED ORDER — INSULIN ASPART 100 UNIT/ML ~~LOC~~ SOLN
0.0000 [IU] | Freq: Three times a day (TID) | SUBCUTANEOUS | Status: DC
  Administered 2011-10-05 – 2011-10-06 (×5): 2 [IU] via SUBCUTANEOUS
  Administered 2011-10-07: 4 [IU] via SUBCUTANEOUS
  Administered 2011-10-07: 2 [IU] via SUBCUTANEOUS
  Administered 2011-10-07: 4 [IU] via SUBCUTANEOUS
  Filled 2011-10-05: qty 3

## 2011-10-05 MED ORDER — SODIUM CHLORIDE 0.9 % IJ SOLN: 3.0000 mL | INTRAMUSCULAR | Status: DC | PRN

## 2011-10-05 MED ORDER — MOVING RIGHT ALONG BOOK
Freq: Once | Status: AC
  Administered 2011-10-05: 09:00:00
  Filled 2011-10-05: qty 1

## 2011-10-05 MED ORDER — OXYCODONE HCL 5 MG PO TABS
5.0000 mg | ORAL_TABLET | ORAL | Status: DC | PRN
  Administered 2011-10-05 – 2011-10-14 (×8): 10 mg via ORAL
  Administered 2011-10-18 – 2011-10-25 (×2): 5 mg via ORAL
  Filled 2011-10-05 (×8): qty 2
  Filled 2011-10-05: qty 1
  Filled 2011-10-05: qty 2

## 2011-10-05 MED ORDER — INSULIN ASPART 100 UNIT/ML ~~LOC~~ SOLN
0.0000 [IU] | Freq: Three times a day (TID) | SUBCUTANEOUS | Status: DC
  Filled 2011-10-05: qty 3

## 2011-10-05 MED ORDER — FUROSEMIDE 10 MG/ML IJ SOLN
8.0000 mg/h | INTRAVENOUS | Status: DC
  Filled 2011-10-05: qty 25

## 2011-10-05 MED ORDER — BISACODYL 10 MG RE SUPP: 10.0000 mg | Freq: Every day | RECTAL | Status: DC | PRN

## 2011-10-05 MED ORDER — ALBUMIN HUMAN 5 % IV SOLN
12.5000 g | Freq: Once | INTRAVENOUS | Status: AC
  Administered 2011-10-05: 12.5 g via INTRAVENOUS
  Filled 2011-10-05 (×2): qty 250

## 2011-10-05 MED ORDER — INSULIN ASPART 100 UNIT/ML ~~LOC~~ SOLN
0.0000 [IU] | SUBCUTANEOUS | Status: DC
  Filled 2011-10-05: qty 3

## 2011-10-05 MED ORDER — WARFARIN VIDEO
Freq: Once | Status: AC
  Administered 2011-10-05: 15:00:00
  Filled 2011-10-05: qty 1

## 2011-10-05 MED ORDER — MAGNESIUM HYDROXIDE 400 MG/5ML PO SUSP: 30.0000 mL | Freq: Every day | ORAL | Status: DC | PRN

## 2011-10-05 MED ORDER — BISACODYL 5 MG PO TBEC
10.0000 mg | DELAYED_RELEASE_TABLET | Freq: Every day | ORAL | Status: DC | PRN
  Administered 2011-10-28: 10 mg via ORAL
  Filled 2011-10-05: qty 2

## 2011-10-05 MED ORDER — MIDAZOLAM BOLUS VIA INFUSION
2.0000 mg | Freq: Once | INTRAVENOUS | Status: AC
  Administered 2011-10-05: 2 mg via INTRAVENOUS
  Filled 2011-10-05: qty 2

## 2011-10-05 MED ORDER — ONDANSETRON HCL 4 MG/2ML IJ SOLN: 4.0000 mg | Freq: Four times a day (QID) | INTRAMUSCULAR | Status: DC | PRN

## 2011-10-05 MED ORDER — COUMADIN BOOK
Freq: Once | Status: AC
  Administered 2011-10-05: 15:00:00
  Filled 2011-10-05: qty 1

## 2011-10-05 MED ORDER — POTASSIUM CHLORIDE CRYS ER 20 MEQ PO TBCR
40.0000 meq | EXTENDED_RELEASE_TABLET | Freq: Three times a day (TID) | ORAL | Status: AC
  Administered 2011-10-05 – 2011-10-07 (×5): 40 meq via ORAL
  Filled 2011-10-05 (×3): qty 2

## 2011-10-05 MED ORDER — POVIDONE-IODINE 10 % EX SOLN
1.0000 "application " | Freq: Two times a day (BID) | CUTANEOUS | Status: DC
  Administered 2011-10-05 – 2011-11-01 (×53): 1 via TOPICAL
  Filled 2011-10-05 (×2): qty 15

## 2011-10-05 MED ORDER — ASPIRIN EC 81 MG PO TBEC
81.0000 mg | DELAYED_RELEASE_TABLET | Freq: Every day | ORAL | Status: DC
  Administered 2011-10-05 – 2011-10-06 (×2): 81 mg via ORAL
  Filled 2011-10-05 (×3): qty 1

## 2011-10-05 MED ORDER — ONDANSETRON HCL 4 MG PO TABS: 4.0000 mg | ORAL_TABLET | Freq: Four times a day (QID) | ORAL | Status: DC | PRN

## 2011-10-05 MED ORDER — METOPROLOL TARTRATE 25 MG PO TABS
25.0000 mg | ORAL_TABLET | Freq: Two times a day (BID) | ORAL | Status: DC
  Administered 2011-10-07 – 2011-10-31 (×38): 25 mg via ORAL
  Filled 2011-10-05 (×56): qty 1

## 2011-10-05 MED ORDER — SODIUM CHLORIDE 0.9 % IV SOLN: 250.0000 mL | INTRAVENOUS | Status: DC

## 2011-10-05 MED ORDER — TRAZODONE HCL 150 MG PO TABS
300.0000 mg | ORAL_TABLET | Freq: Every day | ORAL | Status: DC
  Administered 2011-10-05 – 2011-10-06 (×2): 300 mg via ORAL
  Filled 2011-10-05 (×3): qty 2

## 2011-10-05 MED ORDER — SODIUM CHLORIDE 0.9 % IJ SOLN: 3.0000 mL | Freq: Two times a day (BID) | INTRAMUSCULAR | Status: DC

## 2011-10-05 MED ORDER — DOCUSATE SODIUM 100 MG PO CAPS
200.0000 mg | ORAL_CAPSULE | Freq: Every day | ORAL | Status: DC
  Administered 2011-10-05 – 2011-10-06 (×2): 200 mg via ORAL
  Filled 2011-10-05: qty 2

## 2011-10-05 MED ORDER — POTASSIUM CHLORIDE CRYS ER 20 MEQ PO TBCR
20.0000 meq | EXTENDED_RELEASE_TABLET | Freq: Two times a day (BID) | ORAL | Status: DC
  Administered 2011-10-05: 20 meq via ORAL
  Filled 2011-10-05: qty 1

## 2011-10-05 MED ORDER — ASPIRIN 81 MG PO CHEW
CHEWABLE_TABLET | ORAL | Status: AC
  Filled 2011-10-05: qty 1

## 2011-10-05 MED ORDER — ALUM & MAG HYDROXIDE-SIMETH 400-400-40 MG/5ML PO SUSP
15.0000 mL | ORAL | Status: DC | PRN
  Filled 2011-10-05: qty 30

## 2011-10-05 MED ORDER — TRAMADOL HCL 50 MG PO TABS
50.0000 mg | ORAL_TABLET | ORAL | Status: DC | PRN
  Administered 2011-10-07: 50 mg via ORAL
  Filled 2011-10-05: qty 1

## 2011-10-05 MED ORDER — ACETAMINOPHEN 325 MG PO TABS
650.0000 mg | ORAL_TABLET | Freq: Four times a day (QID) | ORAL | Status: DC | PRN
  Administered 2011-10-17: 650 mg via ORAL
  Filled 2011-10-05 (×2): qty 2

## 2011-10-05 MED ORDER — PANTOPRAZOLE SODIUM 40 MG PO TBEC
40.0000 mg | DELAYED_RELEASE_TABLET | Freq: Every day | ORAL | Status: DC
  Administered 2011-10-05 – 2011-11-01 (×27): 40 mg via ORAL
  Filled 2011-10-05 (×25): qty 1

## 2011-10-05 MED ORDER — INSULIN ASPART 100 UNIT/ML ~~LOC~~ SOLN
0.0000 [IU] | Freq: Three times a day (TID) | SUBCUTANEOUS | Status: DC
  Administered 2011-10-05: 4 [IU] via SUBCUTANEOUS

## 2011-10-05 MED ORDER — LISINOPRIL 10 MG PO TABS
10.0000 mg | ORAL_TABLET | Freq: Every day | ORAL | Status: DC
  Administered 2011-10-05: 10 mg via ORAL
  Filled 2011-10-05 (×2): qty 1

## 2011-10-05 NOTE — Progress Notes (Addendum)
4 Days Post-Op Procedure(s) (LRB): MINIMALLY INVASIVE MITRAL VALVE REPAIR (MVR) (Right)  Subjective: Productive cough with yellow sputum this am.  Sore, but otherwise stable.  Episodes of AF, SR, and PVCs on telemetry.  Objective: Vital signs in last 24 hours: Patient Vitals for the past 24 hrs:  BP Temp Temp src Pulse Resp SpO2 Weight  10/05/11 0905 - - - - - 97 % -  10/05/11 0639 125/83 mmHg 97.9 F (36.6 C) Oral 86  20  97 % 202 lb 12.8 oz (91.989 kg)  10/04/11 2231 134/83 mmHg 97.5 F (36.4 C) Oral 85  20  95 % -  10/04/11 2100 113/62 mmHg - - 89  22  96 % -  10/04/11 2000 114/100 mmHg - - 87  20  95 % -  10/04/11 1954 - 97.6 F (36.4 C) Oral - - - -  10/04/11 1900 110/57 mmHg - - 86  23  96 % -  10/04/11 1800 106/56 mmHg - - 75  24  97 % -  10/04/11 1700 97/60 mmHg - - 88  20  94 % -  10/04/11 1600 - 96.3 F (35.7 C) Axillary 88  26  99 % -  10/04/11 1500 111/62 mmHg - - 77  14  97 % -  10/04/11 1400 112/62 mmHg - - 86  20  96 % -  10/04/11 1300 - - - 25  26  - -  10/04/11 1200 98/67 mmHg 97.7 F (36.5 C) Oral 88  17  97 % -  10/04/11 1100 101/63 mmHg - - 89  21  97 % -   Current Weight  10/05/11 202 lb 12.8 oz (91.989 kg)  Pre-op weight= 92 kg  Hemodynamic parameters for last 24 hours:    Intake/Output from previous day: 11/17 0701 - 11/18 0700 In: 572 [P.O.:120; I.V.:452] Out: 5605 [Urine:5575; Chest Tube:30] Intake/Output this shift:    PHYSICAL EXAM:  Heart: RRR with frequent PACs  Lungs: bibasilar crackles Wound: clean and dry Extremities: + LE edema  Lab Results: CBC: Basename 10/04/11 0445 10/03/11 0426  WBC 19.2* 18.8*  HGB 10.7* 10.0*  HCT 31.7* 28.9*  PLT 39* 48*   BMET:  Basename 10/04/11 0445 10/03/11 0426  NA 142 143  K 3.2* 4.2  CL 101 107  CO2 31 27  GLUCOSE 119* 133*  BUN 37* 26*  CREATININE 1.16 1.15  CALCIUM 9.1 9.6    PT/INR:  Basename 10/05/11 0800  LABPROT 17.3*  INR 1.39    Assessment/Plan: S/P Procedure(s)  (LRB): MINIMALLY INVASIVE MITRAL VALVE REPAIR (MVR) (Right)  CV- intermittent AF/SR.  Continue Amio, Lopressor. Vol overload- on Lasix drip.  ?d/c drip Elevated CBGs- continue Lantus, SSI S/P MV repair- continue Coumadin Leukocytosis, no fever.  Purulent looking sputum.  Encouraged pulm toilet.  Will check sputum cx and monitor WBCs. Continue right chest tube.    LOS: 4 days    COLLINS,GINA H 10/05/2011    Mr. 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  Will need to replace right anterior tube.  He has been diuresing well but BP trending down.  Will d/c lasix drip and foley. Supplement potassium.  Continue amiodarone and coumadin.  Mobilize as much as possible.  [name],[name]

## 2011-10-05 NOTE — Op Note (Signed)
CARDIOTHORACIC SURGERY OPERATIVE NOTE  Date of Procedure: 10/05/2011  Preoperative Diagnosis: Right Pneumothorax Postoperative Diagnosis: Same  Procedure: Right Chest Tube Placement  Surgeon: Salvatore Decent. Cornelius Moras, MD Assistant: n/a Anesthesia: 1% lidocaine local with IV sedation   DETAILS OF THE OPERATIVE PROCEDURE  Following full informed consent the patient was given midazolam 2 mg intravenously and continuously monitored for rhythm, BP and oxygen saturation.  The right anterolateral chest was prepared and draped in a sterile manner.  1% lidocaine was utilized to anesthetize the skin and subcutaneous tissues.  A small incision was made in the right anterolateral chest and a 28 French straight chest tube was placed through the incision into the right pleural space.  The tube was secured to the skin and connected to a closed suction collection device.  The patient tolerated the procedure well.  A portable CXR was ordered.  There were no complications.  Rajendra Spiller H

## 2011-10-05 NOTE — Progress Notes (Signed)
Subjective:   [name] [name] is a 77 yo gentleman with severe MR - s/p minimally invasive MV repair.  He is extubated.   He's doing better today. He's sitting in the chair. He still complains of having chest soreness and tenderness. He still has multiple chest tubes in.  The patient went into A-Fib during the evening of 11/16.  He had been started on PO Amio prior to MV surgery and had been on an Amio drip until yesterday.  He is on coumadin .  No heparin .  He still has significant bloody chest tube drainage.  He is comfortable - no worsening dyspnea.    11/17 - Amio was increasesed yesterday       . acetaminophen  1,000 mg Oral Q6H   Or  . acetaminophen (TYLENOL) oral liquid 160 mg/5 mL  975 mg Per Tube Q6H  . amiodarone  400 mg Oral BID  . aspirin EC  81 mg Oral Daily  . bisacodyl  10 mg Oral Daily   Or  . bisacodyl  10 mg Rectal Daily  . cycloSPORINE  1 drop Both Eyes Q12H  . docusate sodium  200 mg Oral Daily  . docusate sodium  200 mg Oral Daily  . insulin aspart  0-24 Units Subcutaneous Q4H  . insulin aspart  0-24 Units Subcutaneous Q4H  . insulin aspart  0-24 Units Subcutaneous TID AC & HS  . insulin glargine  20 Units Subcutaneous QHS  . lisinopril  10 mg Oral Daily  . metoprolol tartrate  12.5 mg Oral BID   Or  . metoprolol tartrate  12.5 mg Per Tube BID  . metoprolol tartrate  25 mg Oral BID  . moving right along book   Does not apply Once  . pantoprazole  40 mg Oral QAC breakfast  . potassium chloride  10 mEq Intravenous Q1 Hr x 3  . potassium chloride      . potassium chloride      . potassium chloride  20 mEq Oral BID  . povidone-iodine  1 application Topical BID  . simvastatin  20 mg Oral q1800  . sodium chloride   Both Eyes QHS  . sodium chloride  3 mL Intravenous Q12H  . sodium chloride  3 mL Intravenous Q12H  . traZODone  300 mg Oral QHS  . warfarin  2.5 mg Oral q1800  . DISCONTD: aspirin  324 mg Per Tube Daily  . DISCONTD: aspirin EC  325 mg Oral Daily   . DISCONTD: pantoprazole  40 mg Oral Q1200      . sodium chloride 20 mL/hr at 10/05/11 0645  . sodium chloride 20 mL/hr at 10/01/11 2045  . sodium chloride    . sodium chloride    . insulin (NOVOLIN-R) infusion Stopped (10/02/11 0100)  . insulin (NOVOLIN-R) infusion    . insulin (NOVOLIN-R) infusion    . DISCONTD: DOPamine Stopped (10/03/11 1850)  . DISCONTD: furosemide (LASIX) infusion 8 mg/hr (10/04/11 1700)    Objective:  Vital Signs in the last 24 hours: Blood pressure 125/83, pulse 86, temperature 97.9 F (36.6 C), temperature source Oral, resp. rate 20, height 6\' 5"  (1.956 m), weight 202 lb 12.8 oz (91.989 kg), SpO2 97.00%. Temp:  [96.3 F (35.7 C)-97.9 F (36.6 C)] 97.9 F (36.6 C) (11/18 0639) Pulse Rate:  [25-89] 86  (11/18 0639) Resp:  [14-26] 20  (11/18 0639) BP: (97-134)/(56-100) 125/83 mmHg (11/18 0639) SpO2:  [94 %-99 %] 97 % (11/18 0639) Weight:  [202  lb 12.8 oz (91.989 kg)] 202 lb 12.8 oz (91.989 kg) (11/18 0639)  Intake/Output from previous day: 11/17 0701 - 11/18 0700 In: 572 [P.O.:120; I.V.:452] Out: 9604 [Urine:5575; Chest Tube:30] Intake/Output from this shift:    Physical Exam: The patient is alert and oriented x 3.  The mood and affect are normal.   Skin: warm and dry.    HEENT:   the sclera are nonicteric.  The mucous membranes are moist.  The carotids are 2+ without bruits.  There is no thyromegaly.  There is no JVD.    Lungs: There are a few scattered rhonchi.  Heart: regular rate with a normal S1 and S2.  There is a pleural/ pericardial friction rub.  Abdomen: good bowel sounds.  There is no guarding or rebound.  There is no hepatosplenomegaly or tenderness.  There are no masses.   Extremities:  Trace edema especially in the right leg.  Neuro:  Cranial nerves II - XII are intact.  Motor and sensory functions are intact.     Lab Results:  [facility] 10/04/11 0445 10/03/11 0426  WBC 19.2* 18.8*  HGB 10.7* 10.0*  PLT 39* 48*     Basename 10/04/11 0445 10/03/11 0426  NA 142 143  K 3.2* 4.2  CL 101 107  CO2 31 27  GLUCOSE 119* 133*  BUN 37* 26*  CREATININE 1.16 1.15   No results found for this basename: TROPONINI:2,CK,MB:2 in the last 72 hours No results found for this basename: BNP in the last 72 hours Hepatic Function Panel No results found for this basename: PROT,ALBUMIN,AST,ALT,ALKPHOS,BILITOT,BILIDIR,IBILI in the last 72 hours Lab Results  Component Value Date   CHOL 148 04/01/2011   HDL 39.60 04/01/2011   LDLCALC 93 04/01/2011   TRIG 76.0 04/01/2011   CHOLHDL 4 04/01/2011   No results found for this basename: PROTIME in the last 72 hours  Chest tube drainage : bloody drainage  Telemetry:  underlying Atrial fib. Ventricular pacing at 90  Assessment/Plan:  1. Mitral valve repair :   Slow progress.  L:asix drip.  2.  Atrial Fibrillation:  The patient is on low dose Amio.  He remains in A-Fib.  On coumadin  3.  Leukocytosis:  WBC is elevated.  Will watch for signs of infection.  Vesta Mixer, Montez Hageman., MD, [facility] 10/05/2011, 9:33 AM

## 2011-10-05 NOTE — Progress Notes (Signed)
Ambulated patient in hallway on 2L O2 100 feet. Sats started around 98% on 2L, dropped to 85% on 2L with ambulation.  Pt. Returned to bed, withing 5 minutes, O2 sats back at 98% on 2L O2. Harlow Asa

## 2011-10-06 ENCOUNTER — Inpatient Hospital Stay (HOSPITAL_COMMUNITY): Payer: Medicare Other

## 2011-10-06 ENCOUNTER — Encounter (HOSPITAL_COMMUNITY): Payer: Self-pay | Admitting: Radiology

## 2011-10-06 LAB — GLUCOSE, CAPILLARY
Glucose-Capillary: 192 mg/dL — ABNORMAL HIGH (ref 70–99)
Glucose-Capillary: 120 mg/dL — ABNORMAL HIGH (ref 70–99)

## 2011-10-06 LAB — BASIC METABOLIC PANEL
CO2: 36 mEq/L — ABNORMAL HIGH (ref 19–32)
Calcium: 7.8 mg/dL — ABNORMAL LOW (ref 8.4–10.5)
Creatinine, Ser: 1.42 mg/dL — ABNORMAL HIGH (ref 0.50–1.35)
GFR calc Af Amer: 53 mL/min — ABNORMAL LOW (ref >90)

## 2011-10-06 LAB — PROTIME-INR
INR: 1.79 — ABNORMAL HIGH (ref 0.00–1.49)
Prothrombin Time: 21.1 seconds — ABNORMAL HIGH (ref 11.6–15.2)

## 2011-10-06 MED ORDER — MIDAZOLAM HCL 2 MG/2ML IJ SOLN
2.0000 mg | Freq: Once | INTRAMUSCULAR | Status: AC
  Administered 2011-10-06: 2 mg via INTRAVENOUS

## 2011-10-06 MED ORDER — MIDAZOLAM HCL 2 MG/2ML IJ SOLN
INTRAMUSCULAR | Status: AC
  Administered 2011-10-06: 2 mg via INTRAVENOUS
  Filled 2011-10-06: qty 4

## 2011-10-06 MED ORDER — CYCLOSPORINE 0.05 % OP EMUL
1.0000 [drp] | Freq: Two times a day (BID) | OPHTHALMIC | Status: DC
  Administered 2011-10-06 – 2011-10-17 (×23): 1 [drp] via OPHTHALMIC
  Administered 2011-10-18: 23:00:00 via OPHTHALMIC
  Administered 2011-10-19: 1 [drp] via OPHTHALMIC
  Administered 2011-10-19: 23:00:00 via OPHTHALMIC
  Administered 2011-10-20 – 2011-11-01 (×24): 1 [drp] via OPHTHALMIC
  Filled 2011-10-06 (×59): qty 1

## 2011-10-06 MED ORDER — POTASSIUM CHLORIDE CRYS ER 20 MEQ PO TBCR
EXTENDED_RELEASE_TABLET | ORAL | Status: AC
  Filled 2011-10-06: qty 2

## 2011-10-06 MED ORDER — BISACODYL 10 MG RE SUPP
10.0000 mg | Freq: Once | RECTAL | Status: AC
  Administered 2011-10-06: 10 mg via RECTAL
  Filled 2011-10-06: qty 1

## 2011-10-06 MED FILL — Midazolam HCl Inj 2 MG/2ML (Base Equivalent): INTRAMUSCULAR | Qty: 2 | Status: AC

## 2011-10-06 NOTE — Progress Notes (Signed)
TCTS BRIEF SICU PROGRESS NOTE  5 Days Post-Op  S/P Procedure(s) (LRB): MINIMALLY INVASIVE MITRAL VALVE REPAIR (MVR) (Right)   Chest CT reveals small right anteroapical pneumothorax and new right chest tube in major fissure, old tube posterior.  Extensive subQ air.  Assessment/Plan:  Will replace anterior tube.  Discussed with patient and his daughter.  All questions addressed.  [name] [name]

## 2011-10-06 NOTE — Progress Notes (Signed)
OT. Evaluation attempted, however pt. With decreased BP and transferring to 2300. Will re-attempt when pt. Medically stable. Thanks! Cassandria Anger, OTR/L Pager: [phone] 10/06/2011 .

## 2011-10-06 NOTE — Progress Notes (Signed)
   CARDIOTHORACIC SURGERY PROGRESS NOTE  5 Days Post-Op  S/P Procedure(s) (LRB): MINIMALLY INVASIVE MITRAL VALVE REPAIR (MVR) (Right)  Subjective: Slept well last night. Feels okay.  Objective: Vital signs in last 24 hours: Temp:  [97.5 F (36.4 C)-98.2 F (36.8 C)] 97.6 F (36.4 C) (11/19 0627) Pulse Rate:  [66-93] 93  (11/19 0627) Cardiac Rhythm:  [-] Atrial fibrillation (11/19 0830) Resp:  [18-19] 19  (11/19 0627) BP: (70-95)/(34-82) 90/37 mmHg (11/19 0627) SpO2:  [95 %-96 %] 96 % (11/19 0627) Weight:  [93.486 kg (206 lb 1.6 oz)] 206 lb 1.6 oz (93.486 kg) (11/19 4098)  Physical Exam:  Rhythm:   AF  Breath sounds: Clear, subQ air R>L, slightly improved  Heart sounds:  Irreg, no murmur  Incisions:  dry  Abdomen:  soft  Extremities:  Warm, well-perfused   Intake/Output from previous day: 11/18 0701 - 11/19 0700 In: 1260 [P.O.:1260] Out: 2000 [Urine:2000] Intake/Output this shift:    Lab Results:  Basename 10/05/11 1317 10/04/11 0445  WBC 15.7* 19.2*  HGB 11.2* 10.7*  HCT 33.6* 31.7*  PLT 34* 39*   BMET:  Basename 10/06/11 0515 10/05/11 1317  NA 141 140  K 3.2* 2.9*  CL 96 94*  CO2 36* 30  GLUCOSE 138* 226*  BUN 48* 40*  CREATININE 1.42* 1.03  CALCIUM 7.8* 7.9*    CBG (last 3)   Basename 10/06/11 0625 10/05/11 2219 10/05/11 1601  GLUCAP 139* 144* 102*   PT/INR:  21.1/1.79  Assessment/Plan: S/P Procedure(s) (LRB): MINIMALLY INVASIVE MITRAL VALVE REPAIR (MVR) (Right)  Slow progress. Still in and out of AF with controlled rate. BP relatively low last 12-24 hours since restarting ACE-I Right PTX with small air leak and extensive subQ emphysema, s/p chest tube yesterday.  SubQ air may be slightly improved on exam.  Hold ACE-I for now.  Continue tubes to suction. D/c lantus insulin. Continue coumadin, amiodarone. Mobilize as much as possible.  Toiya Morrish H

## 2011-10-06 NOTE — Progress Notes (Signed)
Cardiac Rehab 1110 BP too low for ambulation at this time. Talked with pt's RN. Will continue to follow.Stacia Feazell DunlapRN

## 2011-10-06 NOTE — Progress Notes (Signed)
Subjective:  Patient is not having any significant chest pain or dyspnea.  Still has significant SQ emphysema of face and thorax.  Rhythm is presently atrial fib. Patient in and out of atrial fib overnight.  Rate adequately controlled.BP remains low.  Received IV albumen yesterday.  Objective:  Vital Signs in the last 24 hours: Temp:  [97.5 F (36.4 C)-98.2 F (36.8 C)] 97.6 F (36.4 C) (11/19 0627) Pulse Rate:  [66-93] 93  (11/19 0627) Resp:  [18-19] 19  (11/19 0627) BP: (70-95)/(34-82) 90/37 mmHg (11/19 0627) SpO2:  [95 %-97 %] 96 % (11/19 0627) FiO2 (%):  [28 %] 28 % (11/18 0905) Weight:  [206 lb 1.6 oz (93.486 kg)] 206 lb 1.6 oz (93.486 kg) (11/19 0627)  Intake/Output from previous day: 11/18 0701 - 11/19 0700 In: 900 [P.O.:900] Out: 1850 [Urine:1850] Intake/Output from this shift:       . albumin human  12.5 g Intravenous Once  . amiodarone  400 mg Oral BID  . aspirin EC  81 mg Oral Daily  . coumadin book   Does not apply Once  . docusate sodium  200 mg Oral Daily  . insulin aspart  0-24 Units Subcutaneous TID AC & HS  . insulin glargine  20 Units Subcutaneous QHS  . lisinopril  10 mg Oral Daily  . metoprolol tartrate  25 mg Oral BID  . midazolam  2 mg Intravenous Once  . moving right along book   Does not apply Once  . pantoprazole  40 mg Oral QAC breakfast  . potassium chloride  20 mEq Oral BID  . potassium chloride  40 mEq Oral TID WC  . povidone-iodine  1 application Topical BID  . traZODone  300 mg Oral QHS  . warfarin  2.5 mg Oral q1800  . warfarin   Does not apply Once  . DISCONTD: acetaminophen (TYLENOL) oral liquid 160 mg/5 mL  975 mg Per Tube Q6H  . DISCONTD: acetaminophen  1,000 mg Oral Q6H  . DISCONTD: bisacodyl  10 mg Oral Daily  . DISCONTD: bisacodyl  10 mg Rectal Daily  . DISCONTD: cycloSPORINE  1 drop Both Eyes Q12H  . DISCONTD: docusate sodium  200 mg Oral Daily  . DISCONTD: insulin aspart  0-24 Units Subcutaneous Q4H  . DISCONTD: insulin  aspart  0-24 Units Subcutaneous Q4H  . DISCONTD: insulin aspart  0-24 Units Subcutaneous TID AC & HS  . DISCONTD: insulin aspart  0-24 Units Subcutaneous Q4H  . DISCONTD: insulin aspart  0-24 Units Subcutaneous TID AC & HS  . DISCONTD: metoprolol tartrate  12.5 mg Per Tube BID  . DISCONTD: metoprolol tartrate  12.5 mg Oral BID  . DISCONTD: pantoprazole  40 mg Oral Q1200  . DISCONTD: potassium chloride  20 mEq Oral BID  . DISCONTD: simvastatin  20 mg Oral q1800  . DISCONTD: sodium chloride   Both Eyes QHS  . DISCONTD: sodium chloride  3 mL Intravenous Q12H  . DISCONTD: sodium chloride  3 mL Intravenous Q12H      . DISCONTD: sodium chloride 20 mL/hr at 10/05/11 0645  . DISCONTD: sodium chloride 20 mL/hr at 10/01/11 2045  . DISCONTD: sodium chloride    . DISCONTD: sodium chloride    . DISCONTD: furosemide (LASIX) infusion 8 mg/hr (10/04/11 1700)  . DISCONTD: furosemide (LASIX) infusion    . DISCONTD: insulin (NOVOLIN-R) infusion Stopped (10/02/11 0100)  . DISCONTD: insulin (NOVOLIN-R) infusion    . DISCONTD: insulin (NOVOLIN-R) infusion      Physical  Exam: The patient appears to be in no distress.  Head and neck exam reveals bilateral subcutaneous emphysema.  Chest is clear to percussion and auscultation.  No rales or rhonchi.  Expansion of the chest is symmetrical.  Good aeration at bases bilat.  Heart reveals no abnormal lift or heave.  First and second heart sounds are normal.  There is no murmur gallop rub or click.  The abdomen is soft and nontender.  Bowel sounds are normoactive.  There is no hepatosplenomegaly or mass.  There are no abdominal bruits.  Extremities reveal 3+ soft pitting ankle edema  Neurologic exam is normal strength and no lateralizing weakness.  No sensory deficits.  Integument reveals no rash  Lab Results:  [facility] 10/05/11 1317 10/04/11 0445  WBC 15.7* 19.2*  HGB 11.2* 10.7*  PLT 34* 39*    Basename 10/06/11 0515 10/05/11 1317  NA 141 140    K 3.2* 2.9*  CL 96 94*  CO2 36* 30  GLUCOSE 138* 226*  BUN 48* 40*  CREATININE 1.42* 1.03   No results found for this basename: TROPONINI:2,CK,MB:2 in the last 72 hours Hepatic Function Panel No results found for this basename: PROT,ALBUMIN,AST,ALT,ALKPHOS,BILITOT,BILIDIR,IBILI in the last 72 hours No results found for this basename: CHOL in the last 72 hours No results found for this basename: PROTIME in the last 72 hours  Imaging: Chest xray today pending.  Cardiac Studies:  Assessment/Plan:  Patient Active Problem List  Diagnoses   Atrial fibrillation post-op         Continue amiodarone.Continue warfarin.   Subcutaneous emphysema:          Per surgery.  Chest tubes in place.  Low blood pressure        Will hold lisinopril for systolic BP below 100                                                     LOS: 5 days    Cassell Clement 10/06/2011, 7:44 AM

## 2011-10-06 NOTE — Progress Notes (Signed)
TCTS BRIEF PROGRESS NOTE   Over the course of the day Mr. Mccarey has had further increase subQ air despite functioning chest tubes.  CXR unchanged.  Some increase O2 requirement.  Assessment/Plan:  Will return to SICU for observation and get non-contrast chest CT. ? Need left chest tube or right anterior tube?  [name],[name]

## 2011-10-06 NOTE — Progress Notes (Signed)
Patient received albumin infusion this evening via IV.  Patient blood pressure 78/40, heart rate 94 (sinus rhythm).  Patient asymptomatic and sleeping.  MD notified .  No new orders received.  Will continue to monitor.

## 2011-10-06 NOTE — Progress Notes (Signed)
UR Completed.   [name] [name] 10/06/2011  

## 2011-10-06 NOTE — Op Note (Signed)
Date of Procedure:   10/06/2011  Preoperative Diagnosis:     Right Pneumothorax Postoperative Diagnosis:    Same  Procedure:    Right Chest Tube Placement  Surgeon:        Salvatore Decent. Cornelius Moras, MD Assistant:       n/a Anesthesia:    1% lidocaine local with IV sedation   DETAILS OF THE OPERATIVE PROCEDURE  Following full informed consent the patient was given midazolam 2 mg intravenously and continuously monitored for rhythm, BP and oxygen saturation.  The right anterolateral chest was prepared and draped in a sterile manner.  1% lidocaine was utilized to anesthetize the skin and subcutaneous tissues.  A small incision was made in the right anterolateral chest and a 28 French straight chest tube was placed through the incision into the right pleural space.  The tube was secured to the skin and connected to a closed suction collection device.  The patient tolerated the procedure well.  A portable CXR was ordered.  There were no complications.  Keontae Levingston H

## 2011-10-07 ENCOUNTER — Inpatient Hospital Stay (HOSPITAL_COMMUNITY): Payer: Medicare Other

## 2011-10-07 DIAGNOSIS — I4891 Unspecified atrial fibrillation: Secondary | ICD-10-CM | POA: Diagnosis not present

## 2011-10-07 DIAGNOSIS — J95811 Postprocedural pneumothorax: Secondary | ICD-10-CM | POA: Diagnosis not present

## 2011-10-07 LAB — GLUCOSE, CAPILLARY
Glucose-Capillary: 177 mg/dL — ABNORMAL HIGH (ref 70–99)
Glucose-Capillary: 114 mg/dL — ABNORMAL HIGH (ref 70–99)

## 2011-10-07 LAB — CULTURE, RESPIRATORY W GRAM STAIN: Culture: NORMAL

## 2011-10-07 LAB — BASIC METABOLIC PANEL
BUN: 54 mg/dL — ABNORMAL HIGH (ref 6–23)
CO2: 33 mEq/L — ABNORMAL HIGH (ref 19–32)
Calcium: 8.1 mg/dL — ABNORMAL LOW (ref 8.4–10.5)
Chloride: 99 mEq/L (ref 96–112)
Creatinine, Ser: 1.32 mg/dL (ref 0.50–1.35)

## 2011-10-07 LAB — CBC
HCT: 28 % — ABNORMAL LOW (ref 39.0–52.0)
MCH: 29.8 pg (ref 26.0–34.0)
MCV: 91.8 fL (ref 78.0–100.0)
RBC: 3.05 MIL/uL — ABNORMAL LOW (ref 4.22–5.81)
RDW: 15 % (ref 11.5–15.5)
WBC: 9.6 10*3/uL (ref 4.0–10.5)

## 2011-10-07 MED ORDER — WARFARIN SODIUM 2 MG PO TABS
2.0000 mg | ORAL_TABLET | Freq: Every day | ORAL | Status: DC
  Administered 2011-10-07 – 2011-10-13 (×7): 2 mg via ORAL
  Filled 2011-10-07 (×8): qty 1

## 2011-10-07 MED ORDER — SODIUM CHLORIDE 0.9 % IV SOLN
INTRAVENOUS | Status: DC
  Administered 2011-10-08 (×2): via INTRAVENOUS
  Administered 2011-10-09: 20 mL/h via INTRAVENOUS
  Administered 2011-10-10: 18:00:00 via INTRAVENOUS

## 2011-10-07 MED ORDER — ALBUMIN HUMAN 5 % IV SOLN
12.5000 g | Freq: Once | INTRAVENOUS | Status: AC
  Administered 2011-10-07: 12.5 g via INTRAVENOUS

## 2011-10-07 MED ORDER — SODIUM CHLORIDE 0.9 % IJ SOLN
10.0000 mL | Freq: Two times a day (BID) | INTRAMUSCULAR | Status: DC
  Administered 2011-10-07 – 2011-10-13 (×8): 10 mL

## 2011-10-07 MED ORDER — SODIUM CHLORIDE 0.9 % IJ SOLN
10.0000 mL | INTRAMUSCULAR | Status: DC | PRN
  Administered 2011-10-09 – 2011-11-01 (×51): 10 mL

## 2011-10-07 MED ORDER — DEXTROSE 5 % IV SOLN
150.0000 mg | Freq: Once | INTRAVENOUS | Status: AC
  Administered 2011-10-07: 150 mg via INTRAVENOUS
  Filled 2011-10-07: qty 3

## 2011-10-07 MED ORDER — ALBUMIN HUMAN 5 % IV SOLN
INTRAVENOUS | Status: AC
  Administered 2011-10-07: 12.5 g via INTRAVENOUS
  Filled 2011-10-07: qty 250

## 2011-10-07 MED ORDER — ALBUMIN HUMAN 5 % IV SOLN
12.5000 g | Freq: Once | INTRAVENOUS | Status: AC | PRN
  Administered 2011-10-07: 12.5 g via INTRAVENOUS
  Filled 2011-10-07: qty 250

## 2011-10-07 NOTE — Plan of Care (Signed)
Problem: Discharge Progression Outcomes Goal: Activity appropriate for discharge plan Outcome: Progressing OT evaluation completed, please see note for details. Recommend SNF at D/C and will follow acutely. Thanks! Cassandria Anger, OTR/L Pager: [phone] 10/07/2011 .

## 2011-10-07 NOTE — Progress Notes (Addendum)
CRITICAL VALUE ALERT  Critical value received:  Platelets 26  Date of notification:  11/20  Time of notification:  0530  Critical value read back:yes  Nurse who received alert:  Nolon Nations   MD notified (1st page):  Dr. Donata Clay  Time of first page:  0600  MD notified (2nd page):  Time of second page:  Responding MD:  Dr. Donata Clay  Time MD responded:  0600

## 2011-10-07 NOTE — Plan of Care (Signed)
Problem: Phase III Progression Outcomes Goal: Ambulates with pain/dyspnea controlled Outcome: Progressing PT evaluation completed.  Patient will need SNF with therapy to address balance and safety issues prior to D/C home.  Will continue PT.  Thanks.  [facility] [phone] (pager)

## 2011-10-07 NOTE — Progress Notes (Signed)
   CARDIOTHORACIC SURGERY PROGRESS NOTE   R6 Days Post-Op Procedure(s) (LRB): MINIMALLY INVASIVE MITRAL VALVE REPAIR (MVR) (Right)  Subjective: Feels weak today.  Had diarrhea overnight since stool softeners increased, but insists that he needs a "good" bowel movement.  BP decreased overnight while sleeping, especially after trazadone given.  BP increased after volume given.  Objective: Vital signs: Filed Vitals:   10/07/11 0739  BP:   Pulse:   Temp: 98 F (36.7 C)  Resp:     Hemodynamics:    Physical Exam:  Rhythm:   AFib/AFlutter, HR 90-100  Breath sounds: Fairly clear, subQ air definitely improved  Heart sounds:  Distant, no murmur  Incisions:  Clean and dry  Abdomen:  Soft, non-tender  Extremities:  Warm, well-perfused   Intake/Output from previous day: 11/19 0701 - 11/20 0700 In: 1220 [P.O.:720; IV Piggyback:500] Out: 1280 [Urine:875; Chest Tube:405] Intake/Output this shift:    Lab Results:  [facility] 10/07/11 0438 10/05/11 1317  WBC 9.6 15.7*  HGB 9.1* 11.2*  HCT 28.0* 33.6*  PLT 26* 34*   BMET:  Basename 10/07/11 0438 10/06/11 0515  NA 139 141  K 4.0 3.2*  CL 99 96  CO2 33* 36*  GLUCOSE 141* 138*  BUN 54* 48*  CREATININE 1.32 1.42*  CALCIUM 8.1* 7.8*    CBG (last 3)   Basename 10/06/11 2106 10/06/11 1807 10/06/11 1143  GLUCAP 120* 141* 137*   ABG    Component Value Date/Time   PHART 7.500* 10/02/2011 1023   HCO3 26.5* 10/02/2011 1023   TCO2 22 10/02/2011 1732   ACIDBASEDEF 3.0* 10/02/2011 0650   O2SAT 93.0 10/02/2011 1023   INR 2.6  CXR Stable, no PTX seen.    Assessment/Plan: S/P Procedure(s) (LRB): MINIMALLY INVASIVE MITRAL VALVE REPAIR (MVR) (Right)   Overall stable s/p mitral repair  Postop afib/aflutter with stable HR, therapeutic on coumadin.  BP marginal since back in afib/aflutter, and BP drops whenever patient is asleep or given sedatives.  Will hold trazadone for now.  Volume status a bit unclear, his weight  is still up somewhat from preop but he doesn't look very volume-overloaded on exam  Post-op thrombocytopenia continues to trend down.  HITT panel sent.  No sign of bleeding/thrombosis. Therapeutic on coumadin. D/C ASA  Post-op right pneumothorax with subQ emphysema, now seems to be improving after placement of right anterior chest tube  Generalized weakness, might benefit from PT consult  Will d/c old chest tubes.  [name],[name]

## 2011-10-07 NOTE — Progress Notes (Signed)
Patient ID: [name] [name], male   DOB: [birth year], 77 y.o.   MRN: [id number] Subjective:  Patient is doing well after insertion of new chest tube yesterday evening.  Rhythm is atrial flutter with 2:1 block confirmed by Dr. Cornelius Moras with atrial pacer lead.  Tachypacing resulted in brief restoration of NSR.  Objective:  Vital Signs in the last 24 hours: Temp:  [97.9 F (36.6 C)-98.8 F (37.1 C)] 98 F (36.7 C) (11/20 0739) Pulse Rate:  [83-105] 94  (11/20 0700) Resp:  [18-30] 18  (11/20 0700) BP: (62-123)/(35-69) 115/49 mmHg (11/20 0700) SpO2:  [90 %-100 %] 95 % (11/20 0700) Weight:  [203 lb 4.2 oz (92.2 kg)] 203 lb 4.2 oz (92.2 kg) (11/20 0500)  Intake/Output from previous day: 11/19 0701 - 11/20 0700 In: 1220 [P.O.:720; IV Piggyback:500] Out: 1280 [Urine:875; Chest Tube:405] Intake/Output from this shift:       . albumin human  12.5 g Intravenous Once  . amiodarone  400 mg Oral BID  . bisacodyl  10 mg Rectal Once  . cycloSPORINE  1 drop Both Eyes Q12H  . insulin aspart  0-24 Units Subcutaneous TID AC & HS  . metoprolol tartrate  25 mg Oral BID  . midazolam  2 mg Intravenous Once  . pantoprazole  40 mg Oral QAC breakfast  . potassium chloride  20 mEq Oral BID  . potassium chloride  40 mEq Oral TID WC  . povidone-iodine  1 application Topical BID  . warfarin  2 mg Oral q1800  . DISCONTD: aspirin EC  81 mg Oral Daily  . DISCONTD: docusate sodium  200 mg Oral Daily  . DISCONTD: insulin glargine  20 Units Subcutaneous QHS  . DISCONTD: lisinopril  10 mg Oral Daily  . DISCONTD: traZODone  300 mg Oral QHS  . DISCONTD: warfarin  2.5 mg Oral q1800      Physical Exam: The patient appears to be in no distress.  Head and neck reveal less subcutaneous air.  Chest is clear to percussion and auscultation.  No rales or rhonchi.  Expansion of the chest is symmetrical.  Heart reveals no abnormal lift or heave.  First and second heart sounds are normal.  There is no murmur gallop rub or  click.  No MR murmur.  The abdomen is soft and nontender.  Bowel sounds are normoactive.  There is no hepatosplenomegaly or mass.  There are no abdominal bruits.  Extremities reveal no phlebitis.  Soft 3+ pedal edema persists.   Neurologic exam is normal strength and no lateralizing weakness.  No sensory deficits.  Integument reveals no rash  Lab Results:  Basename 10/07/11 0438 10/05/11 1317  WBC 9.6 15.7*  HGB 9.1* 11.2*  PLT 26* 34*    Basename 10/07/11 0438 10/06/11 0515  NA 139 141  K 4.0 3.2*  CL 99 96  CO2 33* 36*  GLUCOSE 141* 138*  BUN 54* 48*  CREATININE 1.32 1.42*   No results found for this basename: TROPONINI:2,CK,MB:2 in the last 72 hours Hepatic Function Panel No results found for this basename: PROT,ALBUMIN,AST,ALT,ALKPHOS,BILITOT,BILIDIR,IBILI in the last 72 hours No results found for this basename: CHOL in the last 72 hours No results found for this basename: PROTIME in the last 72 hours  Imaging:   Cardiac Studies:  Assessment/Plan:  Patient Active Problem List  Diagnoses  .   .   . HYPERCHOLESTEROLEMIA  . THROMBOCYTOPENIA  . DEPRESSION  . HYPERTENSION, BENIGN ESSENTIAL  . GERD  . DERMATITIS, ATOPIC  .  CALLUS, LEFT FOOT  . OSTEOARTHRITIS, GENERALIZED, MULTIPLE JOINTS  . BACK PAIN, CHRONIC  . MUSCLE SPASM, BACK  . PERSONAL HISTORY MALIGNANT NEOPLASM PROSTATE  . PERSONAL HISTORY OF MALIGNANT MELANOMA OF SKIN  . ARRHYTHMIA, HX OF  . PERSONAL HISTORY OF COLONIC POLYPS  . Cerumen impaction  . Hearing loss  . Valvular heart disease  . S/P mitral valve repair        Plan:  Continue amiodarone loading    LOS: 6 days    Cassell Clement 10/07/2011, 8:18 AM

## 2011-10-07 NOTE — Progress Notes (Signed)
TCTS BRIEF PROGRESS NOTE   Patient rapid atrial paced back into NSR.  Will give extra dose Amiodarone.  [name],[name]

## 2011-10-07 NOTE — Progress Notes (Signed)
Occupational Therapy Evaluation Patient Details Name: [name] [name] MRN: [id number] DOB: [birth year] Today's Date: 10/07/2011  Problem List:  Patient Active Problem List  Diagnoses  . TINEA PEDIS  . ADENOCARCINOMA, PROSTATE  . HYPERCHOLESTEROLEMIA  . THROMBOCYTOPENIA  . DEPRESSION  . HYPERTENSION, BENIGN ESSENTIAL  . GERD  . DERMATITIS, ATOPIC  . CALLUS, LEFT FOOT  . OSTEOARTHRITIS, GENERALIZED, MULTIPLE JOINTS  . BACK PAIN, CHRONIC  . MUSCLE SPASM, BACK  . PERSONAL HISTORY MALIGNANT NEOPLASM PROSTATE  . PERSONAL HISTORY OF MALIGNANT MELANOMA OF SKIN  . ARRHYTHMIA, HX OF  . PERSONAL HISTORY OF COLONIC POLYPS  . Cerumen impaction  . Hearing loss  . Valvular heart disease  . S/P mitral valve repair    Past Medical History:  Past Medical History  Diagnosis Date  . Prostate cancer   . Melanoma     Left Shoulder  . Vision abnormalities     Cornea scarring  . Osteoarthritis     Knees  . Hypertension   . Pure hypercholesterolemia   . Depressive disorder, not elsewhere classified   . MVP (mitral valve prolapse)   . Personal history of colonic polyps   . Valvular heart disease     Last echo in 2010 with EF 55 to 60%, +MVP, mild MR, diastolic dysfunction, biatrial enlargement, mild AI and mild pulmonary HTN  . Normal nuclear stress test 2007  . Thrombocytopenia   . Mitral regurgitation   . MVP (mitral valve prolapse)   . PVC (premature ventricular contraction)   . Shortness of breath   . Blood transfusion   . Neuropathy   . Wears glasses   . Heart murmur     Dr Patty Sermons cardiologist   Past Surgical History:  Past Surgical History  Procedure Date  . Nuclear stress test 09/2006    EF-64%, Normal  . US echocardiography 09/2009, 08/1011    mild LVH,mild AI,MVP with mild MR, mild-mod. TR with mild Pulm. HTN, EF-55-60%  . Inguinal hernia repair 09/2009    Left  . Knee arthroscopy     x 2  . Colonoscopy w/ polypectomy   . Prostatectomy 2003  . Rotator cuff  repair 2003    left  . Melanoma surgery     2001, 2005, 2006, 2009    OT Assessment/Plan/Recommendation OT Assessment Clinical Impression Statement: Pt. will benefit from OT to increase functional independence with ADLs and decrease burden of care at D/C to next venue of care. OT Recommendation/Assessment: Patient will need skilled OT in the acute care venue OT Problem List: Decreased strength;Decreased activity tolerance;Decreased safety awareness;Decreased cognition;Impaired balance (sitting and/or standing) Barriers to Discharge: Decreased caregiver support Barriers to Discharge Comments: Pt. lives alone OT Therapy Diagnosis : Generalized weakness OT Plan OT Frequency: Min 1X/week OT Treatment/Interventions: Self-care/ADL training;Energy conservation;Therapeutic activities;Patient/family education;Balance training OT Recommendation Follow Up Recommendations: Skilled nursing facility Equipment Recommended: Defer to next venue Individuals Consulted Consulted and Agree with Results and Recommendations: Patient OT Goals Acute Rehab OT Goals OT Goal Formulation: With patient Time For Goal Achievement: 1 day;2 weeks ADL Goals Pt Will Perform Grooming: with set-up;with supervision;Standing at sink ADL Goal: Grooming - Progress: Progressing toward goals Pt Will Perform Lower Body Bathing: with min assist;Sit to stand from bed ADL Goal: Lower Body Bathing - Progress: Progressing toward goals Pt Will Perform Lower Body Dressing: with min assist;with adaptive equipment;Sit to stand from bed ADL Goal: Lower Body Dressing - Progress: Progressing toward goals Pt Will Transfer to Toilet: with supervision;3-in-1;Stand pivot transfer  ADL Goal: Statistician - Progress: Progressing toward goals Pt Will Perform Toileting - Hygiene: with set-up;Sit to stand from 3-in-1/toilet ADL Goal: Toileting - Hygiene - Progress: Progressing toward goals Additional ADL Goal #1: Pt. will recall sternal  precautions with ADLs.  OT Evaluation Precautions/Restrictions  Precautions Precautions: Sternal Restrictions Weight Bearing Restrictions: No Prior Functioning Home Living Lives With: Alone Type of Home: House Home Layout: Two level;1/2 bath on main level;Bed/bath upstairs Alternate Level Stairs-Rails: None Alternate Level Stairs-Number of Steps: full flight Home Access: Stairs to enter Entrance Stairs-Rails: None Bathroom Shower/Tub: Health visitor: Standard Bathroom Accessibility: Yes How Accessible: Accessible via walker Home Adaptive Equipment: Walker - rolling;Shower chair with back Prior Function Level of Independence: Independent with basic ADLs;Independent with homemaking with ambulation;Independent with gait;Independent with transfers Able to Take Stairs?: Yes Driving: Yes ADL ADL Eating/Feeding: Simulated;Set up Eating/Feeding Details (indicate cue type and reason): With drinking from cup Where Assessed - Eating/Feeding: Chair Grooming: Performed;Wash/dry hands;Set up;Minimal assistance Grooming Details (indicate cue type and reason): While standind and use of wheelchair for bilateral UE support Where Assessed - Grooming: Standing at sink Upper Body Bathing: Simulated;Chest;Right arm;Left arm;Abdomen;Minimal assistance Where Assessed - Upper Body Bathing: Sitting, chair Lower Body Bathing: Simulated;+1 Total assistance Where Assessed - Lower Body Bathing: Sit to stand from bed Upper Body Dressing: Performed;Minimal assistance Upper Body Dressing Details (indicate cue type and reason): With donning gown Where Assessed - Upper Body Dressing: Sitting, bed Lower Body Dressing: Simulated;+1 Total assistance Lower Body Dressing Details (indicate cue type and reason): Pt. unable to reach down towards feet while sitting and unable to cross foot up onto opposite knee. will benefit from AE education Where Assessed - Lower Body Dressing: Sit to stand from  bed Toilet Transfer: Performed;+2 Total assistance;Comment for patient % (Pt=70%) Toilet Transfer Details (indicate cue type and reason): Mod verbal cues for hand placement on knees to facilitate upright position Toilet Transfer Method: Stand pivot Toilet Transfer Equipment: Bedside commode Toileting - Clothing Manipulation: Performed;Minimal assistance Toileting - Clothing Manipulation Details (indicate cue type and reason): With moving gown Where Assessed - Toileting Clothing Manipulation: Sit to stand from 3-in-1 or toilet Toileting - Hygiene: Performed;+1 Total assistance Toileting - Hygiene Details (indicate cue type and reason): Pt. unable to reach backside to perform hygiene due to chest tube placement and incisional site. Where Assessed - Toileting Hygiene: Sit to stand from 3-in-1 or toilet Tub/Shower Transfer: Not assessed Tub/Shower Transfer Method: Not assessed Equipment Used: Wheelchair;Other (comment) (W/C use as RW) ADL Comments: Pt. completed ambulationg in hallway with total assist +2 pt=75% and max verbal cues for safety and upright position. Pt. needed seated rest break during ~100'. Vision/Perception  Vision - History Baseline Vision: Wears glasses all the time Patient Visual Report: No change from baseline Vision - Assessment Eye Alignment: Within Functional Limits Perception Perception: Within Functional Limits Cognition Cognition Arousal/Alertness: Awake/alert Overall Cognitive Status: Impaired Memory: Appears impaired Memory Deficits: STM  Orientation Level: Oriented X4 Following Commands: Follows multi-step commands inconsistently Safety/Judgement: Decreased awareness of safety precautions;Decreased safety judgement for tasks assessed Decreased Safety/Judgement: Impulsive;Decreased awareness of need for assistance Safety/Judgement - Other Comments: Pt. requiring assist for manuvering wheelchair while walking and also in room around objects  Awareness of  Deficits: Decreased awareness of deficits Sensation/Coordination Sensation Light Touch: Appears Intact Stereognosis: Not tested Hot/Cold: Not tested Proprioception: Not tested Coordination Gross Motor Movements are Fluid and Coordinated: Yes Fine Motor Movements are Fluid and Coordinated: Yes Extremity Assessment RUE Assessment RUE  Assessment: Within Functional Limits LUE Assessment LUE Assessment: Within Functional Limits Mobility  Bed Mobility Bed Mobility: Yes Supine to Sit: 1: +2 Total assist;Patient percentage (comment);HOB flat;With rails (pt=60%) Supine to Sit Details (indicate cue type and reason): Mod verbal cues for handplacement and technique Transfers Transfers: Yes Sit to Stand: 1: +2 Total assist;Patient percentage (comment);From chair/3-in-1 (pt=60% with hands on knees) Sit to Stand Details (indicate cue type and reason): Max verbal cues for hand placement on knees  Exercises   End of Session OT - End of Session Equipment Utilized During Treatment: Gait belt Activity Tolerance: Patient tolerated treatment well Patient left: in chair;with call bell in reach Nurse Communication: Mobility status for transfers General Behavior During Session: [facility] for tasks performed Cognition: Impaired  Co-evaluation with Flora Lipps, OTR/L Pager [phone] 10/07/2011, 1:31 PM

## 2011-10-07 NOTE — Progress Notes (Signed)
CSW completed psychosocial assessment, placed in shadow chart. Pt has bed at [facility]. CSW will send FL2 and PASSAR to facility and will follow for other d/c planning needs.  Baxter Flattery, MSW [phone]

## 2011-10-07 NOTE — Progress Notes (Signed)
TCTS BRIEF PROGRESS NOTE   Stable day.  Looks better. Continue current plan.  [name] [name] H

## 2011-10-07 NOTE — Progress Notes (Signed)
Physical Therapy Evaluation Patient Details Name: [name] [name] MRN: [id number] DOB: 04/22/1931 Today's Date: 10/07/2011  Problem List:  Patient Active Problem List  Diagnoses  . TINEA PEDIS  . ADENOCARCINOMA, PROSTATE  . HYPERCHOLESTEROLEMIA  . THROMBOCYTOPENIA  . DEPRESSION  . HYPERTENSION, BENIGN ESSENTIAL  . GERD  . DERMATITIS, ATOPIC  . CALLUS, LEFT FOOT  . OSTEOARTHRITIS, GENERALIZED, MULTIPLE JOINTS  . BACK PAIN, CHRONIC  . MUSCLE SPASM, BACK  . PERSONAL HISTORY MALIGNANT NEOPLASM PROSTATE  . PERSONAL HISTORY OF MALIGNANT MELANOMA OF SKIN  . ARRHYTHMIA, HX OF  . PERSONAL HISTORY OF COLONIC POLYPS  . Cerumen impaction  . Hearing loss  . Valvular heart disease  . S/P mitral valve repair    Past Medical History:  Past Medical History  Diagnosis Date  . Prostate cancer   . Melanoma     Left Shoulder  . Vision abnormalities     Cornea scarring  . Osteoarthritis     Knees  . Hypertension   . Pure hypercholesterolemia   . Depressive disorder, not elsewhere classified   . MVP (mitral valve prolapse)   . Personal history of colonic polyps   . Valvular heart disease     Last echo in 2010 with EF 55 to 60%, +MVP, mild MR, diastolic dysfunction, biatrial enlargement, mild AI and mild pulmonary HTN  . Normal nuclear stress test 2007  . Thrombocytopenia   . Mitral regurgitation   . MVP (mitral valve prolapse)   . PVC (premature ventricular contraction)   . Shortness of breath   . Blood transfusion   . Neuropathy   . Wears glasses   . Heart murmur     Dr Patty Sermons cardiologist   Past Surgical History:  Past Surgical History  Procedure Date  . Nuclear stress test 09/2006    EF-64%, Normal  . US echocardiography 09/2009, 08/1011    mild LVH,mild AI,MVP with mild MR, mild-mod. TR with mild Pulm. HTN, EF-55-60%  . Inguinal hernia repair 09/2009    Left  . Knee arthroscopy     x 2  . Colonoscopy w/ polypectomy   . Prostatectomy 2003  . Rotator cuff repair  2003    left  . Melanoma surgery     2001, 2005, 2006, 2009    PT Assessment/Plan/Recommendation PT Assessment Clinical Impression Statement: Patient is s/p MVR with PTX and post op a-fib with decr mobility secondary to poor safety awareness and poor balance and endurance.  Will benefit from PT to address balance and endurance as well as safety issues. PT Recommendation/Assessment: Patient will need skilled PT in the acute care venue PT Problem List: Decreased activity tolerance;Decreased balance;Decreased mobility;Decreased cognition;Decreased knowledge of use of DME;Decreased safety awareness;Decreased knowledge of precautions;Cardiopulmonary status limiting activity;Pain Barriers to Discharge: Decreased caregiver support PT Therapy Diagnosis : Difficulty walking;Abnormality of gait;Acute pain PT Plan PT Frequency: Min 3X/week PT Treatment/Interventions: DME instruction;Gait training;Stair training;Functional mobility training;Therapeutic exercise;Balance training;Cognitive remediation;Patient/family education PT Recommendation Follow Up Recommendations: Skilled nursing facility;24 hour supervision/assistance Equipment Recommended: Defer to next venue PT Goals  Acute Rehab PT Goals PT Goal Formulation: With patient Time For Goal Achievement: 2 weeks Pt will go Supine/Side to Sit: with modified independence;with HOB 0 degrees PT Goal: Supine/Side to Sit - Progress: Other (comment) Pt will Transfer Sit to Stand/Stand to Sit: with min assist;from elevated surface;without upper extremity assist PT Transfer Goal: Sit to Stand/Stand to Sit - Progress: Other (comment) Pt will Ambulate: 51 - 150 feet;with supervision;with least restrictive assistive device  PT Goal: Ambulate - Progress: Other (comment) Pt will Go Up / Down Stairs: 3-5 stairs;with supervision;with least restrictive assistive device PT Goal: Up/Down Stairs - Progress: Other (comment) Pt will Perform Home Exercise Program: with  supervision, verbal cues required/provided PT Goal: Perform Home Exercise Program - Progress: Other (comment)  PT Evaluation Precautions/Restrictions  Precautions Precautions: Sternal;Fall Required Braces or Orthoses: No Restrictions Weight Bearing Restrictions: No Prior Functioning  Home Living Lives With: Alone Type of Home: House Home Layout: Two level;1/2 bath on main level;Bed/bath upstairs Alternate Level Stairs-Rails: None Alternate Level Stairs-Number of Steps: flight Home Access: Stairs to enter Entrance Stairs-Rails: None Entrance Stairs-Number of Steps: 3 Bathroom Shower/Tub: Health visitor: Standard Bathroom Accessibility: Yes How Accessible: Accessible via walker Home Adaptive Equipment: Walker - rolling;Shower chair with back Prior Function Level of Independence: Independent with basic ADLs;Independent with homemaking with ambulation;Independent with gait;Independent with transfers Able to Take Stairs?: Yes Driving: Yes Cognition Cognition Arousal/Alertness: Awake/alert Overall Cognitive Status: Impaired Memory: Appears impaired Memory Deficits: STM Orientation Level: Oriented X4 Following Commands: Follows multi-step commands inconsistently Safety/Judgement: Decreased awareness of safety precautions;Decreased safety judgement for tasks assessed Decreased Safety/Judgement: Impulsive;Decreased awareness of need for assistance Safety/Judgement - Other Comments: Pt. needed asssist to maneuver w/c while walking and to steer around objects. Awareness of Deficits: Decreased awareness of deficits Sensation/Coordination Sensation Light Touch: Appears Intact Stereognosis: Not tested Hot/Cold: Not tested Proprioception: Not tested Coordination Gross Motor Movements are Fluid and Coordinated: Yes Fine Motor Movements are Fluid and Coordinated: Yes Extremity Assessment RUE Assessment RUE Assessment: Within Functional Limits LUE Assessment LUE  Assessment: Within Functional Limits RLE Assessment RLE Assessment: Within Functional Limits LLE Assessment LLE Assessment: Within Functional Limits Mobility (including Balance) Bed Mobility Bed Mobility: Yes Supine to Sit: 1: +2 Total assist;Patient percentage (comment);HOB flat;With rails (pt = 60%) Supine to Sit Details (indicate cue type and reason): moderate verbal cues for hand placement and technique Transfers Sit to Stand: 1: +2 Total assist;Patient percentage (comment);From chair/3-in-1 (pt = 60%, educated re: hands on knees) Sit to Stand Details (indicate cue type and reason): max verbal cues for hand placement on knees Ambulation/Gait Ambulation/Gait: Yes Ambulation/Gait Assistance: 1: +2 Total assist;Patient percentage (comment) (pt = 60-70% with incr. assist needed for safety) Ambulation/Gait Assistance Details (indicate cue type and reason): Pt. taking a shorter stride length with right LE entire ambulation.  However, the more tired pt. became, the less safety awareness he demonstrated thus needing more assist at pt = 60% at worst and 70% at best.  Pt. with forward flexed posture and needed cues to keep self close to w/c.  Had to bring a chair up to pt. after he ambulated 110 feet as pt. becoming unsafe and needed incr assistance and pt. not having the safety awareness to stop ambulation.   Ambulation Distance (Feet): 110 Feet Assistive device: Other (Comment) (Pushed wheelchair with equipment in wheelchair) Gait Pattern: Step-to pattern;Decreased step length - right;Decreased stance time - right;Decreased hip/knee flexion - right;Decreased dorsiflexion - right;Decreased weight shift to right;Antalgic;Trunk flexed Gait velocity: Slow cadence Stairs: No Wheelchair Mobility Wheelchair Mobility: No  Posture/Postural Control Posture/Postural Control: Postural limitations Postural Limitations: Pt. keeps head forwardly flexed and trunk and needs cues for upright  stance. Balance Balance Assessed: No Exercise    End of Session PT - End of Session Equipment Utilized During Treatment: Gait belt Activity Tolerance: Patient limited by fatigue Patient left: in chair;with call bell in reach Nurse Communication: Mobility status for ambulation General Behavior During Session:  WFL for tasks performed Cognition: Impaired  INGOLD,Nickolus Wadding 10/07/2011, 3:21 PM [facility] Acute Rehabilitation [phone] (pager)

## 2011-10-07 NOTE — Consult Note (Signed)
WOC consult Note Reason for Consult: Requested to assess sacrum wound Wound type: Deep tissue injury.  Pt had prolonged surgical procedure for 12 hours on 11/14 and has been to CT scan twice.  Deep tissue injury noted on 11/14 when patient re-admitted from 2000, which is consistent with appearance several days after prolonged pressure.   Pt has been on sport low airloss bed to reduce pressure. Measurement: Generalized erythremia to buttocks extends bilat in area approx 10X14 cm.  Dk purple areas 3X3cm to left and 1X1 to right. No open wounds at present, but site is high risk to evolve into full-thickness tissue loss.  Pt has mole to left buttock which is loose and bleeds easily, states he had had this prior to admission.  Having constant liquid incontinent stools and unable to keep from soiling dressings to area. Plan:  Turn to reduce pressure to site as much as possible.  Barrier cream to protect from incontinence.  Cammie Mcgee, RN, MSN, [employer]  [phone]

## 2011-10-08 ENCOUNTER — Inpatient Hospital Stay (HOSPITAL_COMMUNITY): Payer: Medicare Other

## 2011-10-08 LAB — COMPREHENSIVE METABOLIC PANEL
AST: 70 U/L — ABNORMAL HIGH (ref 0–37)
Albumin: 2.7 g/dL — ABNORMAL LOW (ref 3.5–5.2)
BUN: 53 mg/dL — ABNORMAL HIGH (ref 6–23)
Chloride: 100 mEq/L (ref 96–112)
Creatinine, Ser: 1.22 mg/dL (ref 0.50–1.35)
Total Bilirubin: 3.8 mg/dL — ABNORMAL HIGH (ref 0.3–1.2)
Total Protein: 4.9 g/dL — ABNORMAL LOW (ref 6.0–8.3)

## 2011-10-08 LAB — GLUCOSE, CAPILLARY: Glucose-Capillary: 166 mg/dL — ABNORMAL HIGH (ref 70–99)

## 2011-10-08 LAB — PROTIME-INR: INR: 2.39 — ABNORMAL HIGH (ref 0.00–1.49)

## 2011-10-08 LAB — CBC
MCHC: 32.4 g/dL (ref 30.0–36.0)
MCV: 90.2 fL (ref 78.0–100.0)
Platelets: 37 10*3/uL — ABNORMAL LOW (ref 150–400)
RDW: 14.9 % (ref 11.5–15.5)
WBC: 14.3 10*3/uL — ABNORMAL HIGH (ref 4.0–10.5)

## 2011-10-08 MED ORDER — FUROSEMIDE 40 MG PO TABS
40.0000 mg | ORAL_TABLET | Freq: Every day | ORAL | Status: DC
  Administered 2011-10-08 – 2011-10-12 (×5): 40 mg via ORAL
  Filled 2011-10-08 (×5): qty 1

## 2011-10-08 MED ORDER — POTASSIUM CHLORIDE CRYS ER 20 MEQ PO TBCR: 20.0000 meq | EXTENDED_RELEASE_TABLET | Freq: Two times a day (BID) | ORAL | Status: DC

## 2011-10-08 NOTE — Progress Notes (Signed)
Physical Therapy Treatment Patient Details Name: DAMEIR GENTZLER MRN: [id number] DOB: [birth year] Today's Date: 10/08/2011  PT Assessment/Plan  PT - Assessment/Plan Comments on Treatment Session: Patient is s/p MVR with PTX with decreased endurance and decreased balance post op.  Needs continued therapy to progress to prior functional level.   PT Plan: Discharge plan remains appropriate;Frequency remains appropriate PT Frequency: Min 3X/week Follow Up Recommendations: Skilled nursing facility;24 hour supervision/assistance Equipment Recommended: Defer to next venue PT Goals  Acute Rehab PT Goals PT Goal: Supine/Side to Sit - Progress: Other (comment) PT Transfer Goal: Sit to Stand/Stand to Sit - Progress: Progressing toward goal PT Goal: Ambulate - Progress: Progressing toward goal PT Goal: Up/Down Stairs - Progress: Other (comment) PT Goal: Perform Home Exercise Program - Progress: Other (comment)  PT Treatment Precautions/Restrictions  Precautions Precautions: Fall;Sternal Required Braces or Orthoses: No Restrictions Weight Bearing Restrictions: No Mobility (including Balance) Bed Mobility Supine to Sit: Not tested (comment) Supine to Sit Details (indicate cue type and reason): Pt. up in chair on arrival.  Pt.'s gown and linens soaked with urine.  Changed pt. gown and cleaned pt. up in front prior to sit to stand. Transfers Sit to Stand: 1: +2 Total assist;Patient percentage (comment);From chair/3-in-1;Without upper extremity assist (pt.= 65%) Sit to Stand Details (indicate cue type and reason): Continues to need cues re: hands on knees.  Once standing, noted pt. had BM on bottom, therefore cleaned BM and called nursing to change dressing as it also had BM on it.  Pt. stood for about 2 minutes holding onto wheelchair. Ambulation/Gait Ambulation/Gait Assistance: Patient percentage (comment);1: +2 Total assist (pt. = 70-75%) Ambulation/Gait Assistance Details (indicate cue type and  reason): Pt. with more equal step length today.  Pt. did become somewhat shaky with fatigue but did not require as much assist today as he fatigued.  Better safety awareness as well.  Pt. also did not flex his trunk forward.  Followed pt. with chair but he did not need to sit down today.   Ambulation Distance (Feet): 180 Feet Assistive device:  (pushed wheelchair) Gait Pattern: Step-through pattern Gait velocity: Much better cadence today Stairs: No Wheelchair Mobility Wheelchair Mobility: No  Posture/Postural Control Posture/Postural Control: No significant limitations Balance Balance Assessed: No Exercise    End of Session PT - End of Session Equipment Utilized During Treatment: Gait belt Activity Tolerance: Patient limited by fatigue Patient left: in chair;with call bell in reach Nurse Communication: Mobility status for ambulation General Behavior During Session: [facility] for tasks performed Cognition: [facility] for tasks performed  INGOLD,Jaycion Treml 10/08/2011, 4:11 PM [facility] [phone] (pager)

## 2011-10-08 NOTE — Progress Notes (Signed)
Patient ID: [name] [name], male   DOB: [birth year], 77 y.o.   MRN: [id number] Subjective:  Patient feels better today.  Now having bowel movements after prior constipation. Rhythm appears in and out of atrial flutter again--asymptomatic.  No chest pain or dyspnea.  Objective:  Vital Signs in the last 24 hours: Temp:  [97.6 F (36.4 C)-98.5 F (36.9 C)] 97.6 F (36.4 C) (11/21 0739) Pulse Rate:  [74-98] 98  (11/21 1000) Resp:  [16-32] 24  (11/21 1000) BP: (78-135)/(35-99) 102/52 mmHg (11/21 1000) SpO2:  [92 %-100 %] 99 % (11/21 1000) Weight:  [201 lb 11.5 oz (91.5 kg)] 201 lb 11.5 oz (91.5 kg) (11/21 0700)  Intake/Output from previous day: 11/20 0701 - 11/21 0700 In: 940 [P.O.:600; I.V.:240; IV Piggyback:100] Out: 1545 [Urine:1510; Chest Tube:35] Intake/Output from this shift: Total I/O In: 400 [P.O.:340; I.V.:60] Out: 210 [Urine:210]     . amiodarone  150 mg Intravenous Once  . amiodarone  400 mg Oral BID  . cycloSPORINE  1 drop Both Eyes Q12H  . furosemide  40 mg Oral Daily  . metoprolol tartrate  25 mg Oral BID  . pantoprazole  40 mg Oral QAC breakfast  . potassium chloride  20 mEq Oral BID  . povidone-iodine  1 application Topical BID  . sodium chloride  10 mL Intracatheter Q12H  . warfarin  2 mg Oral q1800  . DISCONTD: insulin aspart  0-24 Units Subcutaneous TID AC & HS  . DISCONTD: potassium chloride  20 mEq Oral BID      . sodium chloride 20 mL/hr at 10/08/11 1000    Physical Exam: The patient appears to be in no distress.  Head and neck exam reveals that the pupils are equal and reactive.  The extraocular movements are full.  There is no scleral icterus.  Mouth and pharynx are benign.  No lymphadenopathy.  No carotid bruits.  The jugular venous pressure is normal.  Thyroid is not enlarged or tender. Subcutaneous emphysema is better.  Chest is clear to percussion and auscultation.  No rales or rhonchi.  Expansion of the chest is symmetrical.  Heart reveals no  abnormal lift or heave.  First and second heart sounds are normal.  There is no murmur gallop rub or click.  The abdomen is soft and nontender.  Bowel sounds are normoactive.  There is no hepatosplenomegaly or mass.  There are no abdominal bruits.  Extremities reveal moderate soft pitting edema.  Neurologic exam is normal strength and no lateralizing weakness.  No sensory deficits.  Integument reveals no rash  Lab Results:  Basename 10/08/11 0503 10/07/11 0438  WBC 14.3* 9.6  HGB 9.5* 9.1*  PLT 37* 26*    Basename 10/08/11 0503 10/07/11 0438  NA 138 139  K 4.3 4.0  CL 100 99  CO2 33* 33*  GLUCOSE 147* 141*  BUN 53* 54*  CREATININE 1.22 1.32   No results found for this basename: TROPONINI:2,CK,MB:2 in the last 72 hours Hepatic Function Panel  Basename 10/08/11 0503  PROT 4.9*  ALBUMIN 2.7*  AST 70*  ALT 116*  ALKPHOS 69  BILITOT 3.8*  BILIDIR --  IBILI --   No results found for this basename: CHOL in the last 72 hours No results found for this basename: PROTIME in the last 72 hours  Imaging: Imaging results have been reviewed  Cardiac Studies:  Assessment/Plan:  Patient Active Problem List  Diagnoses  . S/P Mitral valve repair.    Marland Kitchen Post-op atrial flutter  Improving.      Continue amiodarone and warfarin      Will get EKG in am  . Pneumothorax with subcutaneous emphysemia      Improving  . Mild renal insufficiency      Improving  .   .   .   .   .   .   .   .   .   .   .   .   .   .   .   .   .   .     LOS: 7 days    Cassell Clement 10/08/2011, 10:34 AM

## 2011-10-08 NOTE — Progress Notes (Signed)
   CARDIOTHORACIC SURGERY PROGRESS NOTE   R7 Days Post-Op Procedure(s) (LRB): MINIMALLY INVASIVE MITRAL VALVE REPAIR (MVR) (Right)  Subjective: Looks and feels better. No SOB. Minimal pain. Appetite improving. Diarrhea improving. Still fairly weak  Objective: Vital signs: Filed Vitals:   10/08/11 0739  BP:   Pulse:   Temp: 97.6 F (36.4 C)  Resp:     Hemodynamics:    Physical Exam:  Rhythm:   AFib with controlled rate  Breath sounds: Few crackles, subQ air decreased  Heart sounds:  No murmur  Incisions:  Clean and dry  Abdomen:  Soft, non-tender  Extremities:  Warm, mild edema  Chest tube:  No air leak   Intake/Output from previous day: 11/20 0701 - 11/21 0700 In: 940 [P.O.:600; I.V.:240; IV Piggyback:100] Out: 1545 [Urine:1510; Chest Tube:35] Intake/Output this shift:    Lab Results:  Basename 10/08/11 0503 10/07/11 0438  WBC 14.3* 9.6  HGB 9.5* 9.1*  HCT 29.3* 28.0*  PLT 37* 26*   BMET:  Basename 10/08/11 0503 10/07/11 0438  NA 138 139  K 4.3 4.0  CL 100 99  CO2 33* 33*  GLUCOSE 147* 141*  BUN 53* 54*  CREATININE 1.22 1.32  CALCIUM 8.6 8.1*    CBG (last 3)   Basename 10/07/11 2232 10/07/11 1630 10/07/11 1253  GLUCAP 114* 177* 130*   ABG    Component Value Date/Time   PHART 7.500* 10/02/2011 1023   HCO3 26.5* 10/02/2011 1023   TCO2 22 10/02/2011 1732   ACIDBASEDEF 3.0* 10/02/2011 0650   O2SAT 93.0 10/02/2011 1023   INR - 2.39  CXR - stable   Assessment/Plan: S/P Procedure(s) (LRB): MINIMALLY INVASIVE MITRAL VALVE REPAIR (MVR) (Right)   Doing much better, now POD #7 mitral repair  Post-op right PTX with subQ air, improved with anterior tube in place and no air leak  BP stable with ACE-I off ACE-I  Post-op AFib, persistent, rate-controlled, therapeutic on coumadin  Thrombocytopenia pre- and post-op, platelet count increased today, HITT panel pending, no clinical sign of HITT  Diarrhea impoving  Generalized  weakness  Mild volume excess  Will transfer back to step down Continue Amiodarone, coumadin Restart lasix Mobilize, PT Leave tube in to suction today, consider water seal tomorrow  Elek Holderness H

## 2011-10-08 NOTE — Progress Notes (Signed)
CSW sent FL2 to [name] and will follow to coordinate transfer to SNF when pt medically ready. Will need signed 30 day note as pt has existing diagnosis of depression.  Baxter Flattery, MSW [phone]

## 2011-10-09 ENCOUNTER — Other Ambulatory Visit: Payer: Self-pay

## 2011-10-09 ENCOUNTER — Inpatient Hospital Stay (HOSPITAL_COMMUNITY): Payer: Medicare Other

## 2011-10-09 LAB — URINALYSIS, ROUTINE W REFLEX MICROSCOPIC
Bilirubin Urine: NEGATIVE
Glucose, UA: NEGATIVE mg/dL
Hgb urine dipstick: NEGATIVE
Specific Gravity, Urine: 1.011 (ref 1.005–1.030)

## 2011-10-09 LAB — BASIC METABOLIC PANEL
BUN: 41 mg/dL — ABNORMAL HIGH (ref 6–23)
CO2: 31 mEq/L (ref 19–32)
Calcium: 8.6 mg/dL (ref 8.4–10.5)
Chloride: 103 mEq/L (ref 96–112)
Creatinine, Ser: 1 mg/dL (ref 0.50–1.35)
Glucose, Bld: 130 mg/dL — ABNORMAL HIGH (ref 70–99)

## 2011-10-09 LAB — CBC
HCT: 30.4 % — ABNORMAL LOW (ref 39.0–52.0)
Hemoglobin: 9.9 g/dL — ABNORMAL LOW (ref 13.0–17.0)
MCH: 29.8 pg (ref 26.0–34.0)
MCV: 91.6 fL (ref 78.0–100.0)
Platelets: 53 10*3/uL — ABNORMAL LOW (ref 150–400)
RBC: 3.32 MIL/uL — ABNORMAL LOW (ref 4.22–5.81)
WBC: 20 10*3/uL — ABNORMAL HIGH (ref 4.0–10.5)

## 2011-10-09 MED ORDER — POTASSIUM CHLORIDE CRYS ER 20 MEQ PO TBCR
20.0000 meq | EXTENDED_RELEASE_TABLET | Freq: Every day | ORAL | Status: DC
  Administered 2011-10-09 – 2011-10-12 (×4): 20 meq via ORAL
  Filled 2011-10-09 (×3): qty 1

## 2011-10-09 MED ORDER — SALINE SPRAY 0.65 % NA SOLN
1.0000 | NASAL | Status: DC | PRN
  Administered 2011-10-09: 2 via NASAL
  Filled 2011-10-09 (×2): qty 44

## 2011-10-09 NOTE — Progress Notes (Signed)
Attempted ambulation with pt & RW, pt very weak and shaky. Pt c/o dizziness after 25-22ft, returned to room. Pt very SOB on 2L O2, sats 84% increasing to 92% with rest. Pt resting for awhile and then agreed we would get into chair after resting. Relayed reluctance to ambulate pt again today to Doree Fudge [facility] and need to wait for PT to eval again tomorrow. Will continue to monitor.  Ninetta Lights 10/09/2011 2:34 PM

## 2011-10-09 NOTE — Progress Notes (Addendum)
8 Days Post-Op Procedure(s) (LRB): MINIMALLY INVASIVE MITRAL VALVE REPAIR (MVR) (Right)  Subjective: Patient starting to feel better.  Objective: Vital signs in last 24 hours: Patient Vitals for the past 24 hrs:  BP Temp Temp src Pulse Resp SpO2 Weight  10/09/11 0519 109/66 mmHg 97.7 F (36.5 C) Oral 79  18  95 % 210 lb 11.2 oz (95.573 kg)  10/08/11 2041 104/59 mmHg 98.3 F (36.8 C) Oral 93  20  100 % -  10/08/11 1500 107/67 mmHg 97.7 F (36.5 C) Oral 93  20  99 % -  10/08/11 1405 128/95 mmHg - - 100  - 88 % -  10/08/11 1400 128/95 mmHg - - 105  - 99 % -  10/08/11 1300 99/52 mmHg - - 93  23  100 % -  10/08/11 1200 95/48 mmHg - - 96  18  100 % -  10/08/11 1119 - 98.6 F (37 C) Oral - - - -  10/08/11 1100 88/47 mmHg - - 91  21  100 % -  10/08/11 1000 102/52 mmHg - - 98  24  99 % -   Pre op weight  91.6 kg Current Weight  10/09/11 210 lb 11.2 oz (95.573 kg)        Intake/Output from previous day: 11/21 0701 - 11/22 0700 In: 796 [P.O.:340; I.V.:456] Out: 1246 [Urine:1210; Stool:1; Chest Tube:35]   Physical Exam:  Cardiovascular: IRRR, no murmurs, gallops, or rubs. Pulmonary: Decreased at left base, some rhonchi ; no rales, wheezes. Abdomen: Soft, non tender, bowel sounds present. Extremities: 2+ bilateral lower extremity edema. Wounds: Clean and dry.  Ecchymosis right shoulder forearm area.No erythema or signs of infection.  There is sero sanginous drainage from ct sites.  Lab Results: CBC: Basename 10/09/11 0630 10/08/11 0503  WBC 20.0* 14.3*  HGB 9.9* 9.5*  HCT 30.4* 29.3*  PLT 53* 37*   BMET:  Basename 10/09/11 0630 10/08/11 0503  NA 140 138  K 4.5 4.3  CL 103 100  CO2 31 33*  GLUCOSE 130* 147*  BUN 41* 53*  CREATININE 1.00 1.22  CALCIUM 8.6 8.6    PT/INR:  Basename 10/09/11 0630  LABPROT 25.5*  INR 2.28*   ABG:  INR: Will add last result for INR, ABG once components are confirmed Will add last 4 CBG results once components are  confirmed  Assessment/Plan:  1. CV - AF/Aflutter with CVR. Continue with Amiodarone 400 bid,Lopressor 25 bid, Coumadin. 2.  Pulmonary - Encourage incentive spirometer and flutter valve.  CXR today shows decreased b/l ptxs and some improvement in subcutaneous emphysema. Chest tube with 35 cc of output last 12 hours.  Continue chest tube to water seal. Check CXR in am and may possibly dc ct.  Wean O2 as tolerates. 3. Volume Overload -Continue with diuresis. 4.  Acute blood loss anemia - H/H today is stable at 9.9/30.4. 5.Thrombocytopenia-Platelets increased from 53 to 37,000. 6.Leukocytosis-WBC increased from 14.3 to 20.  Remains afebrile, no signs of wound infection.  Left base opacity  (small pl effusion/atx?) on CXR. Check UA C and S and CBC.   Ardelle Balls, PA 10/09/2011   Chart reviewed and patient examined.  Agree with above.  I don't see any air leak from chest tube.  CXR shows decrease in subcutaneous air.  Will continue to water seal.  It is not clear why WBC is elevated.  No obvious source.  Will check urine and follow.

## 2011-10-09 NOTE — Progress Notes (Signed)
Subjective:  Patient now back on 2000.  Overall gradual progress. SQ emphysema is better.  No chest pain.  Objective:  Vital Signs in the last 24 hours: Temp:  [97.7 F (36.5 C)-98.6 F (37 C)] 97.7 F (36.5 C) (11/22 0519) Pulse Rate:  [79-105] 79  (11/22 0519) Resp:  [18-24] 18  (11/22 0519) BP: (88-128)/(43-95) 109/66 mmHg (11/22 0519) SpO2:  [88 %-100 %] 95 % (11/22 0519) Weight:  [210 lb 11.2 oz (95.573 kg)] 210 lb 11.2 oz (95.573 kg) (11/22 0519)  Intake/Output from previous day: 11/21 0701 - 11/22 0700 In: 796 [P.O.:340; I.V.:456] Out: 1246 [Urine:1210; Stool:1; Chest Tube:35] Intake/Output from this shift:       . amiodarone  400 mg Oral BID  . cycloSPORINE  1 drop Both Eyes Q12H  . furosemide  40 mg Oral Daily  . metoprolol tartrate  25 mg Oral BID  . pantoprazole  40 mg Oral QAC breakfast  . potassium chloride  20 mEq Oral BID  . povidone-iodine  1 application Topical BID  . sodium chloride  10 mL Intracatheter Q12H  . warfarin  2 mg Oral q1800  . DISCONTD: insulin aspart  0-24 Units Subcutaneous TID AC & HS  . DISCONTD: potassium chloride  20 mEq Oral BID      . sodium chloride 20 mL/hr at 10/08/11 1413    Physical Exam: The patient appears to be in no distress.  Head and neck exam reveals that the pupils are equal and reactive.  The extraocular movements are full.  There is no scleral icterus.  Mouth and pharynx are benign.  No lymphadenopathy.  No carotid bruits.  The jugular venous pressure is normal.  Thyroid is not enlarged or tender.  Chest is clear to percussion and auscultation.  No rales or rhonchi.  Expansion of the chest is symmetrical.  Heart reveals no abnormal lift or heave.  First and second heart sounds are normal.  There is no murmur gallop rub or click.  The abdomen is soft and nontender.  Bowel sounds are normoactive.  There is no hepatosplenomegaly or mass.  There are no abdominal bruits.  Extremities reveal 3+ pedal edema.  Pedal  pulses are good.  There is no cyanosis or clubbing.  Neurologic exam is normal strength and no lateralizing weakness.  No sensory deficits.  Integument reveals no rash  Lab Results:  Basename 10/09/11 0630 10/08/11 0503  WBC 20.0* 14.3*  HGB 9.9* 9.5*  PLT 53* 37*    Basename 10/08/11 0503 10/07/11 0438  NA 138 139  K 4.3 4.0  CL 100 99  CO2 33* 33*  GLUCOSE 147* 141*  BUN 53* 54*  CREATININE 1.22 1.32   No results found for this basename: TROPONINI:2,CK,MB:2 in the last 72 hours Hepatic Function Panel  Basename 10/08/11 0503  PROT 4.9*  ALBUMIN 2.7*  AST 70*  ALT 116*  ALKPHOS 69  BILITOT 3.8*  BILIDIR --  IBILI --   No results found for this basename: CHOL in the last 72 hours No results found for this basename: PROTIME in the last 72 hours  Imaging: Imaging results have been reviewed  Cardiac Studies: EKG this am shows atrial fibrillation. Telemetry last pm showed slow atrial flutter at times. Assessment/Plan:  Patient Active Problem List  Diagnoses  . Atrial flutter/fib post-op mitral valve repair      Plan: Continue amiodarone and warfarin.  Deconditioning      Increase activity   Thrombocytopenia  Improving  Leucocytosis      No localizing signs.  Afebrile.      Plan:  Repeat CBC in am  .   .   .   .   .   .   .   .   .   .   .   .   .   .   .   .   .   .   .   .   .     LOS: 8 days    Cassell Clement 10/09/2011, 8:19 AM

## 2011-10-10 ENCOUNTER — Other Ambulatory Visit: Payer: Self-pay

## 2011-10-10 ENCOUNTER — Encounter (HOSPITAL_COMMUNITY): Payer: Self-pay | Admitting: Cardiology

## 2011-10-10 ENCOUNTER — Inpatient Hospital Stay (HOSPITAL_COMMUNITY): Payer: Medicare Other

## 2011-10-10 LAB — CBC
Hemoglobin: 8.8 g/dL — ABNORMAL LOW (ref 13.0–17.0)
RBC: 3.01 MIL/uL — ABNORMAL LOW (ref 4.22–5.81)

## 2011-10-10 LAB — DIFFERENTIAL
Lymphocytes Relative: 6 % — ABNORMAL LOW (ref 12–46)
Lymphs Abs: 1.3 10*3/uL (ref 0.7–4.0)
Monocytes Relative: 16 % — ABNORMAL HIGH (ref 3–12)
Neutro Abs: 15.5 10*3/uL — ABNORMAL HIGH (ref 1.7–7.7)
Neutrophils Relative %: 77 % (ref 43–77)

## 2011-10-10 LAB — PROTIME-INR
INR: 2.3 — ABNORMAL HIGH (ref 0.00–1.49)
Prothrombin Time: 25.7 seconds — ABNORMAL HIGH (ref 11.6–15.2)

## 2011-10-10 NOTE — Progress Notes (Addendum)
  CARDIAC REHAB PHASE I   PRE:  Rate/Rhythm: atrial flutter 91  BP:  Supine: 115/67  Sitting:   Standing:    SaO2: 97  MODE:  Ambulation: 15 ft   POST:  Rate/Rhythem:   BP:  Supine:   Sitting:   Standing:     SaO2: 96 2liters ncc  Assisted patient to stand up to walk.  Patient became incontinent of urine and stool. Patient cleaned.  RN applied new dressing to sacral area. Patient walked in room with assistance times three to the recliner.  Chest tube intact.  Call bell within reach.   Gladstone Lighter RN [phone]- [phone]  Harlon Flor Arta Bruce

## 2011-10-10 NOTE — Progress Notes (Signed)
Subjective:  Complains of weakness.  No chest pain. Telemetry shows atrial flutter with 2:1 block on amiodarone.  Objective:  Vital Signs in the last 24 hours: Temp:  [98.2 F (36.8 C)-98.6 F (37 C)] 98.6 F (37 C) (11/23 0540) Pulse Rate:  [86-91] 86  (11/23 0540) Resp:  [18] 18  (11/23 0540) BP: (103-122)/(53-73) 120/53 mmHg (11/23 0540) SpO2:  [98 %-100 %] 98 % (11/23 0540) Weight:  [211 lb 13.8 oz (96.1 kg)] 211 lb 13.8 oz (96.1 kg) (11/23 0639)  Intake/Output from previous day: 11/22 0701 - 11/23 0700 In: 950 [P.O.:710; I.V.:240] Out: 3136 [Urine:2950; Stool:1; Chest Tube:185] Intake/Output from this shift:       . amiodarone  400 mg Oral BID  . cycloSPORINE  1 drop Both Eyes Q12H  . furosemide  40 mg Oral Daily  . metoprolol tartrate  25 mg Oral BID  . pantoprazole  40 mg Oral QAC breakfast  . potassium chloride  20 mEq Oral Daily  . povidone-iodine  1 application Topical BID  . sodium chloride  10 mL Intracatheter Q12H  . warfarin  2 mg Oral q1800  . DISCONTD: potassium chloride  20 mEq Oral BID      . sodium chloride 20 mL/hr (10/09/11 1550)    Physical Exam: The patient appears to be in no distress.  Lungs clear.  Heart reveals soft pericardial rub.  No MR  Lab Results:  Basename 10/10/11 0550 10/09/11 0630  WBC 20.2* 20.0*  HGB 8.8* 9.9*  PLT 45* 53*    Basename 10/09/11 0630 10/08/11 0503  NA 140 138  K 4.5 4.3  CL 103 100  CO2 31 33*  GLUCOSE 130* 147*  BUN 41* 53*  CREATININE 1.00 1.22   No results found for this basename: TROPONINI:2,CK,MB:2 in the last 72 hours Hepatic Function Panel  Basename 10/08/11 0503  PROT 4.9*  ALBUMIN 2.7*  AST 70*  ALT 116*  ALKPHOS 69  BILITOT 3.8*  BILIDIR --  IBILI --   No results found for this basename: CHOL in the last 72 hours No results found for this basename: PROTIME in the last 72 hours  Imaging: Imaging results have been reviewed  Cardiac Studies:  Assessment/Plan:  Patient  Active Problem List  Diagnoses  . Atrial  Flutter      Continue amio and warfarin.  Leucocytosis      Has soft pericardial rub today.  This may be contributing to the lelucocytosis.  .   . Plan: EKG today      ESR in am  .   .   .   .   .   .   .   .   .   .   .   .   .   .   .   .   .   .   .     LOS: 9 days    Cassell Clement 10/10/2011, 8:44 AM

## 2011-10-10 NOTE — Progress Notes (Signed)
Pt not ambulated again today. Talked to PT about seeing pt and they stated they would be unable to see pt today.  Due to extreme weakness, we we uncomfortable ambulating pt without PT eval.  Will continue to monitor.  Ninetta Lights 10/10/2011 6:43 PM

## 2011-10-10 NOTE — Progress Notes (Addendum)
9 Days Post-Op  Procedure(s) (LRB): MINIMALLY INVASIVE MITRAL VALVE REPAIR (MVR) (Right) Subjective: Weak, but improved. Less SOB.   Objective  Telemetry atrial flutter  Temp:  [98.2 F (36.8 C)-98.6 F (37 C)] 98.6 F (37 C) (11/23 0540) Pulse Rate:  [86-91] 86  (11/23 0540) Resp:  [18] 18  (11/23 0540) BP: (103-122)/(53-73) 120/53 mmHg (11/23 0540) SpO2:  [98 %-100 %] 98 % (11/23 0540) Weight:  [211 lb 13.8 oz (96.1 kg)] 211 lb 13.8 oz (96.1 kg) (11/23 1191)   Intake/Output Summary (Last 24 hours) at 10/10/11 0910 Last data filed at 10/10/11 0654  Gross per 24 hour  Intake    950 ml  Output   3136 ml  Net  -2186 ml   CHEST TUBE: no air leak, 185 cc out yesterday   General appearance: alert, fatigued and no distress Heart: no def. murmur, RRR Lungs: mildly diminished in bases Abdomen: soft, non-tender; bowel sounds normal; no masses,  no organomegaly Extremities: 2+ edema Wound: incisions without signs of infection  Lab Results:  Basename 10/09/11 0630 10/08/11 0503  NA 140 138  K 4.5 4.3  CL 103 100  CO2 31 33*  GLUCOSE 130* 147*  BUN 41* 53*  CREATININE 1.00 1.22  CALCIUM 8.6 8.6  MG -- --  PHOS -- --    Basename 10/08/11 0503  AST 70*  ALT 116*  ALKPHOS 69  BILITOT 3.8*  PROT 4.9*  ALBUMIN 2.7*   No results found for this basename: LIPASE:2,AMYLASE:2 in the last 72 hours  Basename 10/10/11 0550 10/09/11 0630  WBC 20.2* 20.0*  NEUTROABS 15.5* --  HGB 8.8* 9.9*  HCT 27.6* 30.4*  MCV 91.7 91.6  PLT 45* 53*   No results found for this basename: CKTOTAL:4,CKMB:4,TROPONINI:4 in the last 72 hours No results found for this basename: POCBNP:3 in the last 72 hours No results found for this basename: DDIMER in the last 72 hours No results found for this basename: HGBA1C in the last 72 hours No results found for this basename: CHOL,HDL,LDLCALC,TRIG,CHOLHDL in the last 72 hours No results found for this basename: TSH,T4TOTAL,FREET3,T3FREE,THYROIDAB in  the last 72 hours No results found for this basename: VITAMINB12,FOLATE,FERRITIN,TIBC,IRON,RETICCTPCT in the last 72 hours  Medications: Scheduled    . amiodarone  400 mg Oral BID  . cycloSPORINE  1 drop Both Eyes Q12H  . furosemide  40 mg Oral Daily  . metoprolol tartrate  25 mg Oral BID  . pantoprazole  40 mg Oral QAC breakfast  . potassium chloride  20 mEq Oral Daily  . povidone-iodine  1 application Topical BID  . sodium chloride  10 mL Intracatheter Q12H  . warfarin  2 mg Oral q1800  . DISCONTD: potassium chloride  20 mEq Oral BID     Radiology/Studies:  Dg Chest 2 View  10/10/2011  *RADIOLOGY REPORT*  Clinical Data: Post CABG.  Evaluate for pneumothorax.  Shortness of breath with weakness.  CHEST - 2 VIEW  Comparison: 10/09/2011  Findings: Small l bilaterall pneumothoraces are identified and appear unchanged in size on the right with an anterior component evident on the lateral view, and slightly diminished on the left, largely apical, in comparison to the prior exam.  The left peripheral central venous catheter, right-sided chest tube and epicardial pacer wires remain unchanged in position.  The patient is status post median sternotomy and valvular replacement.  Heart size and mediastinal contours are stable.  There has been an interval decrease in bilateral diffuse subcutaneous emphysema. Small  bilateral pleural effusions and left lower lobe volume loss is unchanged.  An interval decrease in the degree of pulmonary vascular congestion is noted.  Density in the medial right lung apex is again noted with little interval change in appearance.  IMPRESSION: Slight interval decrease in size of a left apical pneumothorax with little interval change in right pneumothorax.  Slight decrease in pulmonary vascular congestion and subcutaneous emphysema. Persistent bilateral pleural effusions and left lower lobe volume loss.  Original Report Authenticated By: Bertha Stakes, M.D.   Dg Chest  Port 1 View  10/09/2011  *RADIOLOGY REPORT*  Clinical Data: Follow up pneumothorax.  PORTABLE CHEST - 1 VIEW  Comparison: 10/08/2011  Findings: Stable position of the right chest tube.  Again noted are pleural lines along the lung apices suggesting small left pneumothoraces.  There is diffuse subcutaneous gas.  Epicardiac pacer wires are present.  Densities at the left lung base may represent atelectasis and pleural fluid. Questionable focal density in the medial right lung apex.  IMPRESSION: There continues to be evidence for a small apical pneumothoraces, right side greater than left.  Increased densities at the left lung base suggest atelectasis and pleural fluid.  There may be a small focus of consolidation in the medial right lung apex.  Original Report Authenticated By: Richarda Overlie, M.D.    INR: Will add last result for INR, ABG once components are confirmed Will add last 4 CBG results once components are confirmed  Assessment/Plan: S/P Procedure(s) (LRB): MINIMALLY INVASIVE MITRAL VALVE REPAIR (MVR) (Right) 1. Rhythm management - cont current. RX, need to re check LFT's on amiodarone(elevated on the 21st), may be passive congestion 2.Keep chest tube for now, monitor chest xray and output. Cont pulm toilet/wean O2 3.cont diuresis, check bnp 4.abl anemia, monitor, hgb dropped 9.9 to 8.8 5. Leukocytosis, UA negative, abs neuts elevated, afebrile, if total bili still elevated may need to evaluate gallbladder. Could have pulmonary source, check sputum . 6. Thrombocytopenia relatively stable.  LOS: 9 days    GOLD,WAYNE E 11/23/20129:10 AM   Chart reviewed and patient examined.  Agree with above.  There is no air leak and subcutaeous air is resolving.  Will repeat cxr in am and consider removing tube. Elevated wbc of unclear etiology.  No obvious signs of infection

## 2011-10-10 NOTE — Progress Notes (Signed)
UR Completed.  [name] [name] Jane [phone] 10/10/2011  

## 2011-10-11 ENCOUNTER — Inpatient Hospital Stay (HOSPITAL_COMMUNITY): Payer: Medicare Other

## 2011-10-11 LAB — CBC
Hemoglobin: 9.7 g/dL — ABNORMAL LOW (ref 13.0–17.0)
MCHC: 31.9 g/dL (ref 30.0–36.0)
RBC: 3.32 MIL/uL — ABNORMAL LOW (ref 4.22–5.81)
WBC: 20.1 10*3/uL — ABNORMAL HIGH (ref 4.0–10.5)

## 2011-10-11 LAB — PRO B NATRIURETIC PEPTIDE: Pro B Natriuretic peptide (BNP): 3635 pg/mL — ABNORMAL HIGH (ref 0–450)

## 2011-10-11 LAB — COMPREHENSIVE METABOLIC PANEL
ALT: 86 U/L — ABNORMAL HIGH (ref 0–53)
AST: 39 U/L — ABNORMAL HIGH (ref 0–37)
CO2: 31 mEq/L (ref 19–32)
Calcium: 8.7 mg/dL (ref 8.4–10.5)
GFR calc non Af Amer: 81 mL/min — ABNORMAL LOW (ref >90)
Sodium: 139 mEq/L (ref 135–145)

## 2011-10-11 LAB — DIFFERENTIAL
Basophils Relative: 0 % (ref 0–1)
Eosinophils Absolute: 0 10*3/uL (ref 0.0–0.7)
Eosinophils Relative: 0 % (ref 0–5)
Lymphocytes Relative: 13 % (ref 12–46)
Neutrophils Relative %: 77 % (ref 43–77)

## 2011-10-11 LAB — PROTIME-INR: INR: 2.44 — ABNORMAL HIGH (ref 0.00–1.49)

## 2011-10-11 LAB — SEDIMENTATION RATE: Sed Rate: 16 mm/hr (ref 0–16)

## 2011-10-11 NOTE — Progress Notes (Signed)
CARDIAC REHAB PHASE I   PRE:  Rate/Rhythm: 95 SR  BP:  Supine: 111/69  Sitting: 108/53  Standing:    SaO2: 95 2L BP in hall sitting 120/68  MODE:  Ambulation: 150 ft   POST:  Rate/Rhythem: 110 ST  BP:  Supine:   Sitting: 110853  Standing:    SaO2: 94 2L  [name] [name] [phone]  Pt weak . Complains of being lightheaded when walking, Tired easily. Took chair in hall, he sat times two. Assisted times three to ambulate one person to push chair. BP in hall stable. Recommend P. T. Consult to help with strengthening and to assess home needs. He is very anxious when walking about falling and having bowel movements.

## 2011-10-11 NOTE — Progress Notes (Addendum)
10 Days Post-Op  Procedure(s) (LRB): MINIMALLY INVASIVE MITRAL VALVE REPAIR (MVR) (Right) Subjective: Feeling a little stronger, less SOB. No new complaints.  Objective  Telemetry atrial fibrillation with CVR  Temp:  [97.9 F (36.6 C)-98.8 F (37.1 C)] 98.8 F (37.1 C) (11/24 0439) Pulse Rate:  [73-109] 92  (11/24 0439) Resp:  [18-22] 20  (11/24 0439) BP: (100-121)/(47-71) 104/68 mmHg (11/24 0439) SpO2:  [94 %-98 %] 95 % (11/24 0439) Weight:  [214 lb 15.2 oz (97.5 kg)] 214 lb 15.2 oz (97.5 kg) (11/24 0439)   Intake/Output Summary (Last 24 hours) at 10/11/11 0953 Last data filed at 10/11/11 0900  Gross per 24 hour  Intake    970 ml  Output   2556 ml  Net  -1586 ml       General appearance: alert and no distress Neurologic: intact Heart: irregularly irregular rhythm Lungs: diminished in the bases Abdomen: soft, non-tender; bowel sounds normal; no masses,  no organomegaly Extremities: 2+ edema Wound: incisions healing without signs of infection   CHEST TUBE: 50 cc output yesterday, no air leak Lab Results:  Basename 10/11/11 0500 10/09/11 0630  NA 139 140  K 4.6 4.5  CL 101 103  CO2 31 31  GLUCOSE 144* 130*  BUN 27* 41*  CREATININE 0.87 1.00  CALCIUM 8.7 8.6  MG -- --  PHOS -- --    Basename 10/11/11 0500  AST 39*  ALT 86*  ALKPHOS 87  BILITOT 2.3*  PROT 5.0*  ALBUMIN 2.4*   No results found for this basename: LIPASE:2,AMYLASE:2 in the last 72 hours  Basename 10/11/11 0500 10/10/11 0550  WBC 20.1* 20.2*  NEUTROABS 15.5* 15.5*  HGB 9.7* 8.8*  HCT 30.4* 27.6*  MCV 91.6 91.7  PLT 58* 45*   No results found for this basename: CKTOTAL:4,CKMB:4,TROPONINI:4 in the last 72 hours  Basename 10/11/11 0600  POCBNP 3635.0*   No results found for this basename: DDIMER in the last 72 hours No results found for this basename: HGBA1C in the last 72 hours No results found for this basename: CHOL,HDL,LDLCALC,TRIG,CHOLHDL in the last 72 hours No results  found for this basename: TSH,T4TOTAL,FREET3,T3FREE,THYROIDAB in the last 72 hours No results found for this basename: VITAMINB12,FOLATE,FERRITIN,TIBC,IRON,RETICCTPCT in the last 72 hours  Medications: Scheduled    . amiodarone  400 mg Oral BID  . cycloSPORINE  1 drop Both Eyes Q12H  . furosemide  40 mg Oral Daily  . metoprolol tartrate  25 mg Oral BID  . pantoprazole  40 mg Oral QAC breakfast  . potassium chloride  20 mEq Oral Daily  . povidone-iodine  1 application Topical BID  . sodium chloride  10 mL Intracatheter Q12H  . warfarin  2 mg Oral q1800     Radiology/Studies:  Dg Chest 2 View  10/11/2011  *RADIOLOGY REPORT*  Clinical Data: Right-sided pneumothorax status post thoracotomy  CHEST - 2 VIEW  Comparison: 10/10/2011  Findings: There is a right-sided chest tube in place.  Tiny right apical pneumothorax is unchanged.  Similar appearance of chest wall and supraclavicular subcu emphysema.  Prior median sternotomy and CABG procedure.  Bilateral pleural effusions are unchanged from previous exam.  IMPRESSION:  1.  Stable small right apical pneumothorax. 2.  Mild CHF. Unchanged from previous exam.  Original Report Authenticated By: Rosealee Albee, M.D.   Dg Chest 2 View  10/10/2011  *RADIOLOGY REPORT*  Clinical Data: Post CABG.  Evaluate for pneumothorax.  Shortness of breath with weakness.  CHEST - 2  VIEW  Comparison: 10/09/2011  Findings: Small l bilaterall pneumothoraces are identified and appear unchanged in size on the right with an anterior component evident on the lateral view, and slightly diminished on the left, largely apical, in comparison to the prior exam.  The left peripheral central venous catheter, right-sided chest tube and epicardial pacer wires remain unchanged in position.  The patient is status post median sternotomy and valvular replacement.  Heart size and mediastinal contours are stable.  There has been an interval decrease in bilateral diffuse subcutaneous  emphysema. Small bilateral pleural effusions and left lower lobe volume loss is unchanged.  An interval decrease in the degree of pulmonary vascular congestion is noted.  Density in the medial right lung apex is again noted with little interval change in appearance.  IMPRESSION: Slight interval decrease in size of a left apical pneumothorax with little interval change in right pneumothorax.  Slight decrease in pulmonary vascular congestion and subcutaneous emphysema. Persistent bilateral pleural effusions and left lower lobe volume loss.  Original Report Authenticated By: Bertha Stakes, M.D.    INR: Will add last result for INR, ABG once components are confirmed Will add last 4 CBG results once components are confirmed  Assessment/Plan: S/P Procedure(s) (LRB): MINIMALLY INVASIVE MITRAL VALVE REPAIR (MVR) (Right)  1.Cont. Afib management, LFT's improved. 2. Leukocytosis stable without fevers or definate source. 3.Cont diuresis for CHF 4.d/c chest tube 5. pulm toilet 5. rehab  LOS: 10 days    GOLD,WAYNE E 11/24/20129:53 AM    Chart reviewed, patient examined, agree with above. Subcutaneous air is almost gone on cxr.  Will dc chest tube.

## 2011-10-11 NOTE — Progress Notes (Signed)
Subjective:  The patient is still weak but ambulated around circle today. Rhythm remains atrial fib with controlled ventricular response.  Objective:  Vital Signs in the last 24 hours: Temp:  [97.9 F (36.6 C)-98.8 F (37.1 C)] 98.8 F (37.1 C) (11/24 0439) Pulse Rate:  [73-109] 92  (11/24 0439) Resp:  [18-22] 20  (11/24 0439) BP: (100-121)/(47-71) 104/68 mmHg (11/24 0439) SpO2:  [94 %-98 %] 95 % (11/24 0439) Weight:  [214 lb 15.2 oz (97.5 kg)] 214 lb 15.2 oz (97.5 kg) (11/24 0439)  Intake/Output from previous day: 11/23 0701 - 11/24 0700 In: 730 [P.O.:720; I.V.:10] Out: 1451 [Urine:1400; Stool:1; Chest Tube:50] Intake/Output from this shift: Total I/O In: 240 [I.V.:240] Out: 1505 [Urine:1500; Chest Tube:5]     . amiodarone  400 mg Oral BID  . cycloSPORINE  1 drop Both Eyes Q12H  . furosemide  40 mg Oral Daily  . metoprolol tartrate  25 mg Oral BID  . pantoprazole  40 mg Oral QAC breakfast  . potassium chloride  20 mEq Oral Daily  . povidone-iodine  1 application Topical BID  . sodium chloride  10 mL Intracatheter Q12H  . warfarin  2 mg Oral q1800      . sodium chloride 20 mL/hr at 10/10/11 1806    Physical Exam: The patient appears to be in no distress.  Head and neck exam reveals that the pupils are equal and reactive.  The extraocular movements are full.  There is no scleral icterus.  Mouth and pharynx are benign.  No lymphadenopathy.  No carotid bruits.  The jugular venous pressure is normal.  Thyroid is not enlarged or tender.  Chest is clear to percussion and auscultation.  No rales or rhonchi.  Expansion of the chest is symmetrical.  Heart reveals no abnormal lift or heave.  First and second heart sounds are normal.  There is no murmur gallop  or click.  Soft pericardial rub at apex associated with mild inspiratory chest discomfort.  The abdomen is soft and nontender.  Bowel sounds are normoactive.  There is no hepatosplenomegaly or mass.  There are no  abdominal bruits.  Extremities reveal 2+ pitting edema.  Pedal pulses are good.  There is no cyanosis or clubbing.  Neurologic exam is normal strength and no lateralizing weakness.  No sensory deficits.  Integument reveals no rash  Lab Results:  Basename 10/11/11 0500 10/10/11 0550  WBC 20.1* 20.2*  HGB 9.7* 8.8*  PLT 58* 45*    Basename 10/11/11 0500 10/09/11 0630  NA 139 140  K 4.6 4.5  CL 101 103  CO2 31 31  GLUCOSE 144* 130*  BUN 27* 41*  CREATININE 0.87 1.00   No results found for this basename: TROPONINI:2,CK,MB:2 in the last 72 hours Hepatic Function Panel  Basename 10/11/11 0500  PROT 5.0*  ALBUMIN 2.4*  AST 39*  ALT 86*  ALKPHOS 87  BILITOT 2.3*  BILIDIR --  IBILI --   No results found for this basename: CHOL in the last 72 hours No results found for this basename: PROTIME in the last 72 hours  Imaging: Imaging results have been reviewed  Cardiac Studies:  Assessment/Plan:  Patient Active Problem List  Diagnoses  . Atrial fibrillation post-op      Continue amiodarone. LFTs  Improving since 10/08/11.Continue to follow.     . Leukocytosis--no change.      Afebrile.  . Pericardial rub      Sed rate normal so probably not contributing to leukocytosis.  .   .   .   .   .   .   .   .   .   .   .   .   .   .   .   .   .   .   Marland Kitchen  LOS: 10 days    Cassell Clement 10/11/2011, 9:33 AM

## 2011-10-11 NOTE — Progress Notes (Signed)
Three hours after chest tube removal, while transferring patient from bed to chair, notice a significant amount of serosanguinous fluid  Gosh out of chest tube dressing. Patient was stable vital stable, see VS. Patient,s sat was 98-99% on two litters of o2. Dressing was reinforced and later changed and PA notified. No new orders.

## 2011-10-11 NOTE — Progress Notes (Signed)
Chest tube removed per MD order. patient tolerated procedure. Patient appears stable post removal. Waiting for  Transportation for chest x-ray

## 2011-10-11 NOTE — Progress Notes (Signed)
Case Management:   Noted pt. Will be discharging to SNF.  NCM will continue to follow for any further discharge needs.  Tera Mater, RN, BSN Case Manager # [phone]

## 2011-10-11 NOTE — Progress Notes (Signed)
PT Cancellation Note  Treatment cancelled today due to medical issues with patient which prohibited therapy Treatment cancelled today due to patient receiving procedure or test   Treatment cancelled today due to patient's refusal to participate  Treatment cancelled today due to pulling chest tube on first arrival and going to radiology at pm attempt---x

## 2011-10-12 LAB — PROTIME-INR: Prothrombin Time: 22.8 seconds — ABNORMAL HIGH (ref 11.6–15.2)

## 2011-10-12 MED ORDER — POTASSIUM CHLORIDE CRYS ER 20 MEQ PO TBCR
20.0000 meq | EXTENDED_RELEASE_TABLET | Freq: Two times a day (BID) | ORAL | Status: DC
  Administered 2011-10-12 – 2011-10-16 (×8): 20 meq via ORAL
  Filled 2011-10-12 (×9): qty 1

## 2011-10-12 MED ORDER — FUROSEMIDE 40 MG PO TABS
40.0000 mg | ORAL_TABLET | Freq: Two times a day (BID) | ORAL | Status: DC
  Administered 2011-10-12 – 2011-10-16 (×8): 40 mg via ORAL
  Filled 2011-10-12 (×10): qty 1

## 2011-10-12 NOTE — Progress Notes (Signed)
SUBJECTIVE: Feeling better. No chest pain. Mild SOB.   BP 110/64  Pulse 92  Temp(Src) 97.6 F (36.4 C) (Oral)  Resp 20  Ht 6\' 5"  (1.956 m)  Wt 214 lb 15.2 oz (97.5 kg)  BMI 25.49 kg/m2  SpO2 96%  Intake/Output Summary (Last 24 hours) at 10/12/11 9604 Last data filed at 10/12/11 0500  Gross per 24 hour  Intake   1060 ml  Output   2731 ml  Net  -1671 ml    PHYSICAL EXAM General: Well developed, well nourished, in no acute distress. Alert and oriented x 3.  Psych:  Good affect, responds appropriately Neck: No JVD. No masses noted.  Lungs: Clear bilaterally with no wheezes or rhonci noted.  Heart: RRR with no murmurs noted. Abdomen: Bowel sounds are present. Soft, non-tender.  Extremities: No lower extremity edema.   LABS: Basic Metabolic Panel:  Basename 10/11/11 0500  NA 139  K 4.6  CL 101  CO2 31  GLUCOSE 144*  BUN 27*  CREATININE 0.87  CALCIUM 8.7  MG --  PHOS --   CBC:  Basename 10/11/11 0500 10/10/11 0550  WBC 20.1* 20.2*  NEUTROABS 15.5* 15.5*  HGB 9.7* 8.8*  HCT 30.4* 27.6*  MCV 91.6 91.7  PLT 58* 45*    Current Meds:    . amiodarone  400 mg Oral BID  . cycloSPORINE  1 drop Both Eyes Q12H  . furosemide  40 mg Oral Daily  . metoprolol tartrate  25 mg Oral BID  . pantoprazole  40 mg Oral QAC breakfast  . potassium chloride  20 mEq Oral Daily  . povidone-iodine  1 application Topical BID  . sodium chloride  10 mL Intracatheter Q12H  . warfarin  2 mg Oral q1800     ASSESSMENT AND PLAN:  1. Severe MR: s/p mitral valve repair.   2. Post-op atrial fibrillation: Rate controlled. On amiodarone.    Naiomi Musto  11/25/20127:28 AM

## 2011-10-12 NOTE — Progress Notes (Addendum)
11 Days Post-Op  Procedure(s) (LRB): MINIMALLY INVASIVE MITRAL VALVE REPAIR (MVR) (Right) Subjective Slowly feeling stronger, without new complaints. Exercise tolerance remains poor but improving. Shortness of breath is improving.  Objective  Telemetry sinus rhythm  Temp:  [97.6 F (36.4 C)-98.4 F (36.9 C)] 97.6 F (36.4 C) (11/25 0504) Pulse Rate:  [83-93] 92  (11/25 0504) Resp:  [19-20] 20  (11/25 0504) BP: (97-114)/(55-67) 110/64 mmHg (11/25 0504) SpO2:  [96 %-98 %] 96 % (11/25 0504)   Intake/Output Summary (Last 24 hours) at 10/12/11 1106 Last data filed at 10/12/11 0500  Gross per 24 hour  Intake    580 ml  Output    775 ml  Net   -195 ml       General appearance: alert, cooperative, fatigued and no distress Heart: regular rate and rhythm and S1, S2 normal Lungs: diminished in the bases Abdomen: soft, non-tender; bowel sounds normal; no masses,  no organomegaly Extremities: 2+ bilat LE edema Wound: incisions without signs of infection  Lab Results:  Basename 10/11/11 0500  NA 139  K 4.6  CL 101  CO2 31  GLUCOSE 144*  BUN 27*  CREATININE 0.87  CALCIUM 8.7  MG --  PHOS --    Basename 10/11/11 0500  AST 39*  ALT 86*  ALKPHOS 87  BILITOT 2.3*  PROT 5.0*  ALBUMIN 2.4*   No results found for this basename: LIPASE:2,AMYLASE:2 in the last 72 hours  Basename 10/11/11 0500 10/10/11 0550  WBC 20.1* 20.2*  NEUTROABS 15.5* 15.5*  HGB 9.7* 8.8*  HCT 30.4* 27.6*  MCV 91.6 91.7  PLT 58* 45*   No results found for this basename: CKTOTAL:4,CKMB:4,TROPONINI:4 in the last 72 hours  Basename 10/11/11 0600  POCBNP 3635.0*   No results found for this basename: DDIMER in the last 72 hours No results found for this basename: HGBA1C in the last 72 hours No results found for this basename: CHOL,HDL,LDLCALC,TRIG,CHOLHDL in the last 72 hours No results found for this basename: TSH,T4TOTAL,FREET3,T3FREE,THYROIDAB in the last 72 hours No results found for this  basename: VITAMINB12,FOLATE,FERRITIN,TIBC,IRON,RETICCTPCT in the last 72 hours  Medications: Scheduled    . amiodarone  400 mg Oral BID  . cycloSPORINE  1 drop Both Eyes Q12H  . furosemide  40 mg Oral Daily  . metoprolol tartrate  25 mg Oral BID  . pantoprazole  40 mg Oral QAC breakfast  . potassium chloride  20 mEq Oral Daily  . povidone-iodine  1 application Topical BID  . sodium chloride  10 mL Intracatheter Q12H  . warfarin  2 mg Oral q1800     Radiology/Studies:  Dg Chest 2 View  10/11/2011  *RADIOLOGY REPORT*  Clinical Data: 77 year old male with chest tube removal.  Cough and congestion.  Recent mitral valve replacement.  CHEST - 2 VIEW  Comparison: 10/11/2011 and prior chest radiographs  Findings: Cardiomegaly and mitral valve replacement changes again noted. A left PICC line is again identified with tip overlying cavoatrial junction. A right thoracostomy tube has been removed with unchanged small (5%) right apical pneumothorax. Bibasilar atelectasis and small bilateral pleural effusions are again noted. Subcutaneous emphysema is unchanged.  IMPRESSION: Right thoracostomy tube removal with unchanged small (5%) right apical pneumothorax.  Cardiomegaly with continued bibasilar atelectasis and small bilateral pleural effusions.  Original Report Authenticated By: Rosendo Gros, M.D.   Dg Chest 2 View  10/11/2011  *RADIOLOGY REPORT*  Clinical Data: Right-sided pneumothorax status post thoracotomy  CHEST - 2 VIEW  Comparison: 10/10/2011  Findings: There is a right-sided chest tube in place.  Tiny right apical pneumothorax is unchanged.  Similar appearance of chest wall and supraclavicular subcu emphysema.  Prior median sternotomy and CABG procedure.  Bilateral pleural effusions are unchanged from previous exam.  IMPRESSION:  1.  Stable small right apical pneumothorax. 2.  Mild CHF. Unchanged from previous exam.  Original Report Authenticated By: Rosealee Albee, M.D.    INR: Will add  last result for INR, ABG once components are confirmed Will add last 4 CBG results once components are confirmed  Assessment/Plan: S/P Procedure(s) (LRB): MINIMALLY INVASIVE MITRAL VALVE REPAIR (MVR) (Right)  1. Steady slow progress with rehab 2. Rhythm stable 3. Cont diuresis, will return to bid lasix 4. Push rehab 5. Recheck labs in the am  LOS: 11 days    [name] [name] 11/25/201211:06 AM    Chart reviewed, patient examined, agree with above. Weight is still 12 lbs over preop and has significant lower ext edema.  Lasix increased. CXR looks good after chest tube removal yesterday.

## 2011-10-13 ENCOUNTER — Other Ambulatory Visit: Payer: Self-pay

## 2011-10-13 ENCOUNTER — Encounter (HOSPITAL_COMMUNITY): Payer: Self-pay | Admitting: Thoracic Surgery (Cardiothoracic Vascular Surgery)

## 2011-10-13 LAB — HEPARIN INDUCED THROMBOCYTOPENIA PNL
Heparin Induced Plt Ab: POSITIVE
Patient O.D.: 0.538
UFH High Dose UFH H: 15 % Release
UFH Low Dose 0.1 IU/mL: 17 % Release
UFH Low Dose 0.5 IU/mL: 14 % Release
UFH SRA Result: NEGATIVE

## 2011-10-13 LAB — BASIC METABOLIC PANEL
BUN: 24 mg/dL — ABNORMAL HIGH (ref 6–23)
CO2: 32 mEq/L (ref 19–32)
Chloride: 101 mEq/L (ref 96–112)
Creatinine, Ser: 0.95 mg/dL (ref 0.50–1.35)
GFR calc Af Amer: >90 mL/min (ref >90)
Glucose, Bld: 136 mg/dL — ABNORMAL HIGH (ref 70–99)
Potassium: 4 mEq/L (ref 3.5–5.1)

## 2011-10-13 LAB — CBC
HCT: 29.7 % — ABNORMAL LOW (ref 39.0–52.0)
Hemoglobin: 9.6 g/dL — ABNORMAL LOW (ref 13.0–17.0)
MCV: 92 fL (ref 78.0–100.0)
RDW: 15 % (ref 11.5–15.5)
WBC: 15.7 10*3/uL — ABNORMAL HIGH (ref 4.0–10.5)

## 2011-10-13 LAB — PROTIME-INR: INR: 2.22 — ABNORMAL HIGH (ref 0.00–1.49)

## 2011-10-13 MED ORDER — NYSTATIN 100000 UNIT/ML MT SUSP
5.0000 mL | Freq: Four times a day (QID) | OROMUCOSAL | Status: DC
  Administered 2011-10-13 – 2011-10-26 (×50): 500000 [IU] via OROMUCOSAL
  Filled 2011-10-13 (×63): qty 5

## 2011-10-13 MED ORDER — COLLAGENASE 250 UNIT/GM EX OINT
TOPICAL_OINTMENT | Freq: Every day | CUTANEOUS | Status: DC
  Administered 2011-10-13 – 2011-10-19 (×8): via TOPICAL
  Filled 2011-10-13: qty 30

## 2011-10-13 NOTE — Progress Notes (Addendum)
Pt expressed desire to resume Trazadone he was taking PTA for depression (300mg  PO at bedtime), as he has had some difficulty sleeping and has decreased appetite. CSW also notes pt restless in bed today. CSW continues to follow for d/c planning.  Pt has bed available at [facility] when pt medically ready.  Baxter Flattery, MSW [phone]

## 2011-10-13 NOTE — Progress Notes (Signed)
Subjective:  Patient feels a little stronger today.  Did not walk in hall yesterday. Complains of sore mouth and tongue.  Exam reveals probable thrush.  Difficulty eating.  Objective:  Vital Signs in the last 24 hours: Temp:  [97.5 F (36.4 C)-98.8 F (37.1 C)] 97.9 F (36.6 C) (11/26 0557) Pulse Rate:  [85-93] 93  (11/26 0557) Resp:  [20] 20  (11/26 0557) BP: (101-125)/(64-73) 107/66 mmHg (11/26 0557) SpO2:  [98 %-99 %] 98 % (11/26 0557)  Intake/Output from previous day: 11/25 0701 - 11/26 0700 In: 360 [P.O.:360] Out: 2075 [Urine:2075] Intake/Output from this shift:       . amiodarone  400 mg Oral BID  . cycloSPORINE  1 drop Both Eyes Q12H  . furosemide  40 mg Oral BID  . metoprolol tartrate  25 mg Oral BID  . pantoprazole  40 mg Oral QAC breakfast  . potassium chloride  20 mEq Oral BID  . povidone-iodine  1 application Topical BID  . sodium chloride  10 mL Intracatheter Q12H  . warfarin  2 mg Oral q1800  . DISCONTD: furosemide  40 mg Oral Daily  . DISCONTD: potassium chloride  20 mEq Oral Daily      Physical Exam: The patient appears to be in no distress.  Head and neck exam reveals that the pupils are equal and reactive.  The extraocular movements are full.  There is no scleral icterus.  Mouth and pharynx show probable thrush.  No lymphadenopathy.  No carotid bruits.  The jugular venous pressure is normal.  Thyroid is not enlarged or tender.  Chest is clear to percussion and auscultation.  No rales or rhonchi.  Expansion of the chest is symmetrical.  Heart reveals no abnormal lift or heave.  First and second heart sounds are normal.  There is no murmur gallop rub or click.  The abdomen is soft and nontender.  Bowel sounds are normoactive.  There is no hepatosplenomegaly or mass.  There are no abdominal bruits.  Extremities reveal moderate pedal edema.  Pedal pulses are good.  There is no cyanosis or clubbing.  Neurologic exam is normal strength and no  lateralizing weakness.  No sensory deficits.  Integument reveals no rash  Lab Results:  Basename 10/13/11 0500 10/11/11 0500  WBC 15.7* 20.1*  HGB 9.6* 9.7*  PLT 61* 58*    Basename 10/13/11 0500 10/11/11 0500  NA 138 139  K 4.0 4.6  CL 101 101  CO2 32 31  GLUCOSE 136* 144*  BUN 24* 27*  CREATININE 0.95 0.87   No results found for this basename: TROPONINI:2,CK,MB:2 in the last 72 hours Hepatic Function Panel  Basename 10/11/11 0500  PROT 5.0*  ALBUMIN 2.4*  AST 39*  ALT 86*  ALKPHOS 87  BILITOT 2.3*  BILIDIR --  IBILI --   No results found for this basename: CHOL in the last 72 hours No results found for this basename: PROTIME in the last 72 hours  Imaging: Imaging results have been reviewed  Cardiac Studies: Telemetry this am shows NSR with 1 degree AV block.  Will confirm with EKG Assessment/Plan:  Patient Active Problem List  Diagnoses  . S/P MV repair      Doing well Post-op atrial fib      Appears to be back in NSR this am on telemetry.  Will get EKG Thrush      Will add mycostatin oral suspension. Deconditioning      ContinueCardiac Rehab/\.  .   .   .   .   .   .   .   .   .   .   .   .   .   .   .   .   .   .   .   .   Marland Kitchen  LOS: 12 days    Cassell Clement 10/13/2011, 7:40 AM

## 2011-10-13 NOTE — Progress Notes (Signed)
   CARDIOTHORACIC SURGERY PROGRESS NOTE  12 Days Post-Op  S/P Procedure(s) (LRB): MINIMALLY INVASIVE MITRAL VALVE REPAIR (MVR) (Right)  Subjective: Looks and feels better, but still weak and deconditioned.  Needs help just getting up to bathroom  Objective: Vital signs in last 24 hours: Temp:  [97.5 F (36.4 C)-98.8 F (37.1 C)] 97.9 F (36.6 C) (11/26 0557) Pulse Rate:  [85-93] 93  (11/26 0557) Cardiac Rhythm:  [-] Heart block (11/26 0900) Resp:  [20] 20  (11/26 0557) BP: (101-125)/(64-73) 107/66 mmHg (11/26 0557) SpO2:  [98 %-99 %] 98 % (11/26 0557)  Physical Exam:  Rhythm:   sinus  Breath sounds: clear  Heart sounds:  RRR  Incisions:  healing  Abdomen:  Soft, nontender  Extremities:  warm   Intake/Output from previous day: 11/25 0701 - 11/26 0700 In: 360 [P.O.:360] Out: 2075 [Urine:2075] Intake/Output this shift:    Lab Results:  Basename 10/13/11 0500 10/11/11 0500  WBC 15.7* 20.1*  HGB 9.6* 9.7*  HCT 29.7* 30.4*  PLT 61* 58*   BMET:  Basename 10/13/11 0500 10/11/11 0500  NA 138 139  K 4.0 4.6  CL 101 101  CO2 32 31  GLUCOSE 136* 144*  BUN 24* 27*  CREATININE 0.95 0.87  CALCIUM 8.3* 8.7    CBG (last 3)  No results found for this basename: GLUCAP:3 in the last 72 hours PT/INR:   Basename 10/13/11 0500  LABPROT 25.0*  INR 2.22*    CXR:  N/A  Assessment/Plan: S/P Procedure(s) (LRB): MINIMALLY INVASIVE MITRAL VALVE REPAIR (MVR) (Right)  Doing better.  Back in NSR.  All tubes out.  WBC decreased and no sign of infection.  Platelets increased.  Therapeutic on Coumadin.  Still quite weak and deconditioned.  Mouth sore ? Thrush.  [name],[name] 10/13/2011

## 2011-10-13 NOTE — Progress Notes (Signed)
Physical Therapy Treatment Patient Details Name: [name] [name] MRN: [id number] DOB: August 22, 1931 Today's Date: 10/13/2011  PT Assessment/Plan  PT - Assessment/Plan Comments on Treatment Session: Patient is s/p MVR with PTX with decreased endurance and decreased balance post op.  Fluctuating status from day to day.  Needs continued therapy to progress to prior functional level.   PT Plan: Discharge plan remains appropriate;Frequency remains appropriate PT Frequency: Min 3X/week Follow Up Recommendations: Skilled nursing facility;24 hour supervision/assistance Equipment Recommended: Defer to next venue PT Goals  Acute Rehab PT Goals PT Goal Formulation: With patient Time For Goal Achievement: 2 weeks PT Goal: Supine/Side to Sit - Progress: Progressing toward goal PT Transfer Goal: Sit to Stand/Stand to Sit - Progress: Met PT Goal: Ambulate - Progress: Progressing toward goal PT Goal: Up/Down Stairs - Progress: Other (comment) PT Goal: Perform Home Exercise Program - Progress: Progressing toward goal  PT Treatment Precautions/Restrictions  Precautions Precautions: Fall;Sternal Required Braces or Orthoses: No Restrictions Weight Bearing Restrictions: No Mobility (including Balance) Bed Mobility Supine to Sit: 4: Min assist Supine to Sit Details (indicate cue type and reason): Pt. needed cues for technique to follow sternal precautions.   Transfers Sit to Stand: 4: Min assist;From elevated surface;With upper extremity assist;From bed Sit to Stand Details (indicate cue type and reason): Continues to need cues re: hands on knees.  Once standing, Cammie Mcgee was in room to address buttock ulcer and she examined pt. Then, pt. ambulated with RW.  Pt. rushing somewhat and at times becomes unsteady due to this.   Ambulation/Gait Ambulation/Gait Assistance: 4: Min assist Ambulation/Gait Assistance Details (indicate cue type and reason): Pt. with unequal step length and somewhat shaky  throughout today.  Pt. reports being shaky as soon as he sat up.  Pt. without much change today in his gait or endurance.  He did not flex forward and was standing upright as well.  Pt. did not need to be followed with the chair.  Distance limited today secondary to pt. stood awhile at the beginning of the treatment.   Ambulation Distance (Feet): 75 Feet Assistive device: Rolling walker Gait Pattern: Step-to pattern;Decreased step length - right Gait velocity: Cadence faster but pt. unsteady Stairs: No Wheelchair Mobility Wheelchair Mobility: No  Posture/Postural Control Posture/Postural Control: No significant limitations Balance Balance Assessed: Yes Dynamic Standing Balance Dynamic Standing - Balance Support: Bilateral upper extremity supported Dynamic Standing - Level of Assistance: 4: Min assist Dynamic Standing - Balance Activities: Forward lean/weight shifting Exercise    End of Session PT - End of Session Equipment Utilized During Treatment: Gait belt Activity Tolerance: Patient limited by fatigue Patient left: in chair;with call bell in reach Nurse Communication: Mobility status for ambulation General Behavior During Session: [facility] for tasks performed Cognition: [facility] for tasks performed  INGOLD,Glendel Jaggers 10/13/2011, 1:47 PM [facility] [phone] (pager)

## 2011-10-13 NOTE — Consult Note (Signed)
WOC follow-up Note Reason for Consult: Buttocks with previous deep tissue injury to bilat buttocks after extensive inter-operative procedure. Wound type: Right buttock DTI decreasing in size, .5X.5 cm dk purple deep tissue injury. Remains with protruding mole-type lesion which was present prior to admission.  Gluteal cleft reddened with partial thickness skin loss. Left buttock has evolved into 4X3 cm unstageable wound, 100% yellow slough, small yellow drainage, no odor. Dressing procedure/placement/frequency:Santyl ointment to chemically  Debride nonviable tissue.  Pt now getting OOB and ambulating, so this will help to decrease pressure to site.  Cammie Mcgee, RN, MSN, [employer]  [phone]

## 2011-10-13 NOTE — Progress Notes (Signed)
CARDIAC REHAB PHASE I   PRE:  Rate/Rhythm: 85SR  BP:  Supine:113/51   Sitting: 122/58  Standing:    SaO2: 96%2L  MODE:  Ambulation: 150 ft   POST:  Rate/Rhythem: 96SR  BP:  Supine:   Sitting: 127/70  Standing:    SaO2: 98%2L  Pt walked 150 ft with asst x 2 and rolling walker on O2 at 2L with shakey gait. Pt c/o dizziness upon standing and had to sit back down for a few minutes. BP at that time was 122/58.  To bed after walk. Tired but tolerated well. [phone]  [name] [name]

## 2011-10-14 ENCOUNTER — Inpatient Hospital Stay (HOSPITAL_COMMUNITY): Payer: Medicare Other

## 2011-10-14 LAB — CBC
HCT: 29.2 % — ABNORMAL LOW (ref 39.0–52.0)
Hemoglobin: 9.4 g/dL — ABNORMAL LOW (ref 13.0–17.0)
MCH: 29.5 pg (ref 26.0–34.0)
MCHC: 32.2 g/dL (ref 30.0–36.0)
MCV: 91.5 fL (ref 78.0–100.0)
RDW: 15.3 % (ref 11.5–15.5)

## 2011-10-14 LAB — BASIC METABOLIC PANEL
BUN: 27 mg/dL — ABNORMAL HIGH (ref 6–23)
Creatinine, Ser: 0.97 mg/dL (ref 0.50–1.35)
GFR calc Af Amer: 89 mL/min — ABNORMAL LOW (ref >90)
GFR calc non Af Amer: 77 mL/min — ABNORMAL LOW (ref >90)
Glucose, Bld: 137 mg/dL — ABNORMAL HIGH (ref 70–99)
Potassium: 3.7 mEq/L (ref 3.5–5.1)

## 2011-10-14 MED ORDER — ENSURE PUDDING PO PUDG
1.0000 | Freq: Three times a day (TID) | ORAL | Status: DC
  Administered 2011-10-14 – 2011-10-31 (×43): 1 via ORAL

## 2011-10-14 MED ORDER — WARFARIN SODIUM 1 MG PO TABS
1.0000 mg | ORAL_TABLET | Freq: Every day | ORAL | Status: DC
  Administered 2011-10-14 – 2011-10-15 (×2): 1 mg via ORAL
  Filled 2011-10-14 (×3): qty 1

## 2011-10-14 MED ORDER — AMIODARONE HCL 200 MG PO TABS
200.0000 mg | ORAL_TABLET | Freq: Two times a day (BID) | ORAL | Status: DC
  Administered 2011-10-14 – 2011-10-22 (×18): 200 mg via ORAL
  Filled 2011-10-14 (×20): qty 1

## 2011-10-14 MED ORDER — POTASSIUM CHLORIDE 10 MEQ PO TBCR
30.0000 meq | EXTENDED_RELEASE_TABLET | Freq: Once | ORAL | Status: AC
  Administered 2011-10-14: 30 meq via ORAL
  Filled 2011-10-14: qty 3

## 2011-10-14 NOTE — Progress Notes (Signed)
Occupational Therapy Treatment Patient Details Name: [name] [name] MRN: [id number] DOB: [birth year] Today's Date: 10/14/2011  OT Assessment/Plan OT Assessment/Plan OT Plan: Discharge plan remains appropriate OT Frequency: Min 1X/week Follow Up Recommendations: Skilled nursing facility Equipment Recommended: Defer to next venue OT Goals Acute Rehab OT Goals OT Goal Formulation: With patient Time For Goal Achievement: 2 weeks ADL Goals Pt Will Perform Grooming: with set-up;with supervision;Standing at sink ADL Goal: Grooming - Progress: Progressing toward goals Pt Will Perform Lower Body Bathing: with min assist;Sit to stand from bed ADL Goal: Lower Body Bathing - Progress: Progressing toward goals Pt Will Perform Lower Body Dressing: with min assist;with adaptive equipment;Sit to stand from bed ADL Goal: Lower Body Dressing - Progress: Progressing toward goals Pt Will Transfer to Toilet: with supervision;3-in-1;Stand pivot transfer ADL Goal: Toilet Transfer - Progress: Progressing toward goals Pt Will Perform Toileting - Hygiene: with set-up;Sit to stand from 3-in-1/toilet ADL Goal: Toileting - Hygiene - Progress: Progressing toward goals Additional ADL Goal #1: Pt. will recall sternal precautions with ADLs.  OT Treatment Precautions/Restrictions  Precautions Precautions: Fall;Sternal Restrictions Weight Bearing Restrictions: No   ADL ADL Eating/Feeding: Not assessed Grooming: Performed;Wash/dry hands Where Assessed - Grooming: Sitting, chair Upper Body Bathing: Not assessed Lower Body Bathing: Not assessed Upper Body Dressing: Not assessed Lower Body Dressing: Performed;+1 Total assistance Lower Body Dressing Details (indicate cue type and reason): Pt. unable to reach down towards feet while sitting and unable to cross foot up onto opposite knee. will benefit from AE education Where Assessed - Lower Body Dressing: Sit to stand from chair Toilet Transfer:  Performed;Moderate assistance Toilet Transfer Details (indicate cue type and reason): Mod verbal cues for hand placement on knees to facilitate upright position Toilet Transfer Method: Stand pivot Toilet Transfer Equipment: Bedside commode Toileting - Clothing Manipulation: Performed;Minimal assistance Toileting - Clothing Manipulation Details (indicate cue type and reason): With moving gown Where Assessed - Toileting Clothing Manipulation: Sit to stand from 3-in-1 or toilet Toileting - Hygiene: Performed;+1 Total assistance Toileting - Hygiene Details (indicate cue type and reason): Pt. unable to reach backside to perform hygiene. Where Assessed - Toileting Hygiene: Sit to stand from 3-in-1 or toilet Tub/Shower Transfer: Not assessed Tub/Shower Transfer Method: Not assessed Equipment Used: Rolling walker Mobility  Bed Mobility Bed Mobility: No Transfers Transfers: Yes Sit to Stand: 4: Min assist;From elevated surface;With upper extremity assist;From bed Sit to Stand Details (indicate cue type and reason): Max verbal cues for hand placement on knees     End of Session OT - End of Session Equipment Utilized During Treatment: Gait belt Activity Tolerance: Patient tolerated treatment well Patient left: in chair;with call bell in reach Nurse Communication: Mobility status for transfers General Behavior During Session: [facility] for tasks performed Cognition: [facility] for tasks performed  Law Corsino, OTR/L Pager [phone]  10/14/2011, 12:27 PM

## 2011-10-14 NOTE — Progress Notes (Signed)
Pt daughter address concerns regarding Nystatin medication not working. She also address concerns about pt nonproductive cough. I will continue to monitor pt.

## 2011-10-14 NOTE — Progress Notes (Signed)
Patient's right groin incision leaking with serosanguineous fluid gauze dressing applied to site with tape. Mid sternum,right lateral chest incision, and right upper chest incision dressing also changed. Dressing are dry and intact. Pacer wires incisions are open to air. Will continue to monitor and report to next shift.

## 2011-10-14 NOTE — Progress Notes (Addendum)
13 Days Post-Op Procedure(s) (LRB): MINIMALLY INVASIVE MITRAL VALVE REPAIR (MVR) (Right)  Subjective: Patient states mouth is still very sore. It is difficult for him to chew and swallow.  He also has complaints that his buttocks are sore.  Objective: Vital signs in last 24 hours: Patient Vitals for the past 24 hrs:  BP Temp Temp src Pulse Resp SpO2  10/14/11 0429 105/62 mmHg 97.7 F (36.5 C) Oral 87  20  96 %  10/13/11 2150 103/59 mmHg 98.3 F (36.8 C) Oral 89  20  98 %  10/13/11 1353 116/70 mmHg 97.3 F (36.3 C) Oral 70  20  91 %  10/13/11 1100 - - - 104  - 87 %  10/13/11 1055 - - - 94  - 93 %   Pre op weight 91.6 kg Current Weight  10/11/11 214 lb 15.2 oz (97.5 kg)        Intake/Output from previous day: 11/26 0701 - 11/27 0700 In: -  Out: 2176 [Urine:2175; Stool:1]   Physical Exam:  Cardiovascular: RRR, no murmurs, gallops, or rubs. Pulmonary: Decreased at bases R>L ; no rales, wheezes, or rhonchi. Abdomen: Soft, non tender, bowel sounds present. Extremities: Mild bilateral lower extremity edema. Wounds: Clean and dry.  No erythema or signs of infection.  Lab Results: CBC: Basename 10/14/11 0515 10/13/11 0500  WBC 14.7* 15.7*  HGB 9.4* 9.6*  HCT 29.2* 29.7*  PLT 66* 61*   BMET:  Basename 10/14/11 0515 10/13/11 0500  NA 137 138  K 3.7 4.0  CL 99 101  CO2 33* 32  GLUCOSE 137* 136*  BUN 27* 24*  CREATININE 0.97 0.95  CALCIUM 8.3* 8.3*    PT/INR:  Basename 10/14/11 0515  LABPROT 26.4*  INR 2.38*   ABG:  INR: Will add last result for INR, ABG once components are confirmed Will add last 4 CBG results once components are confirmed  Assessment/Plan:  1. CV - Previous AFIB. Maintaining SR this am.  EKG shows SR with first degree AV block.Amiodarone decreased to 200 bid. Continue with Lopressor 25 bid.Will decrease Coumadin to 1 mg tonight (EPW need to be removed). 2.  Pulmonary - Encourage incentive spirometer. 3. Volume Overload - Continue with  diuresis. 4.  Acute blood loss anemia -H/H stable at 9.4/29.2. 5. Continue mycostatin for probable thrush. 6.Supplement KCL. 7.Patient has an ulcer on buttock. Wound care nurse saw yesterday and patient is receiving Santyl ointment etc.   8.Conintue CRPI/PT.   Ardelle Balls, PA 10/14/2011   I have seen and examined the patient and agree with the assessment and plan as outlined.  Will add Ensure dietary supplements.  Coltrane Tugwell H

## 2011-10-14 NOTE — Progress Notes (Signed)
Physical Therapy Treatment Patient Details Name: [name] [name] MRN: [id number] DOB: [birth year] Today's Date: 10/14/2011  PT Assessment/Plan  PT - Assessment/Plan Comments on Treatment Session: Patient is s/p MVR with PTX with decreased endurance and decreased balance.  Continue PT to address balance and endurance issues.   PT Plan: Discharge plan remains appropriate;Frequency remains appropriate PT Frequency: Min 3X/week Follow Up Recommendations: Skilled nursing facility;24 hour supervision/assistance Equipment Recommended: Defer to next venue PT Goals  Acute Rehab PT Goals PT Goal Formulation: With patient Time For Goal Achievement: 2 weeks PT Goal: Supine/Side to Sit - Progress: Other (comment) PT Transfer Goal: Sit to Stand/Stand to Sit - Progress: Progressing toward goal PT Goal: Ambulate - Progress: Progressing toward goal PT Goal: Up/Down Stairs - Progress: Other (comment) PT Goal: Perform Home Exercise Program - Progress: Other (comment)  PT Treatment Precautions/Restrictions  Precautions Precautions: Fall;Sternal Required Braces or Orthoses: No Restrictions Weight Bearing Restrictions: No Mobility (including Balance) Bed Mobility Bed Mobility: No Supine to Sit: Not tested (comment) Transfers Sit to Stand: With upper extremity assist;From chair/3-in-1;3: Mod assist Sit to Stand Details (indicate cue type and reason): max verbal cues for hand placement on knees.  Needed mod assist by PT secondary from low recliner.  Placed pillows in recliner under geomat so pt. would not have such a hard time when he gets up next time. Ambulation/Gait Ambulation/Gait Assistance: 4: Min assist Ambulation/Gait Assistance Details (indicate cue type and reason): Pt. less shaky.  Took several standing rest breaks.  Good safety with RW most of the time.  Did not follow pt.with chair and he increased his distance.   Ambulation Distance (Feet): 180 Feet Assistive device: Rolling walker Gait  Pattern: Step-to pattern;Decreased step length - right Gait velocity: Cadence steady pace Stairs: No Wheelchair Mobility Wheelchair Mobility: No  Posture/Postural Control Posture/Postural Control: No significant limitations Postural Limitations: Close to upright stance without cues. Balance Balance Assessed: No Exercise    End of Session PT - End of Session Equipment Utilized During Treatment: Gait belt Activity Tolerance: Patient tolerated treatment well Patient left: in chair;with call bell in reach Nurse Communication: Mobility status for ambulation General Behavior During Session: [facility] for tasks performed Cognition: [facility] for tasks performed  INGOLD,Mark Hassey 10/14/2011, 1:15 PM [facility] [phone] (pager)

## 2011-10-14 NOTE — Progress Notes (Signed)
Pt ambulated around the circle(176feet) using rolling walker with rest at interval,pt O2 saturation during ambulation is 94% on 2LPM/[place].

## 2011-10-14 NOTE — Progress Notes (Signed)
Subjective:  Patient states that mouth is still very sore, difficult to swallow or chew food. No chest pain.  Walked 3 times in hall yesterday.  Objective:  Vital Signs in the last 24 hours: Temp:  [97.3 F (36.3 C)-98.3 F (36.8 C)] 97.7 F (36.5 C) (11/27 0429) Pulse Rate:  [70-104] 87  (11/27 0429) Resp:  [20] 20  (11/27 0429) BP: (103-116)/(59-70) 105/62 mmHg (11/27 0429) SpO2:  [87 %-98 %] 96 % (11/27 0429)  Intake/Output from previous day: 11/26 0701 - 11/27 0700 In: -  Out: 2176 [Urine:2175; Stool:1] Intake/Output from this shift:       . amiodarone  400 mg Oral BID  . collagenase   Topical Daily  . cycloSPORINE  1 drop Both Eyes Q12H  . furosemide  40 mg Oral BID  . metoprolol tartrate  25 mg Oral BID  . nystatin  5 mL Mouth/Throat QID  . pantoprazole  40 mg Oral QAC breakfast  . potassium chloride  20 mEq Oral BID  . povidone-iodine  1 application Topical BID  . sodium chloride  10 mL Intracatheter Q12H  . warfarin  2 mg Oral q1800      Physical Exam: The patient appears to be in no distress.  Head and neck exam reveals that the pupils are equal and reactive.  The extraocular movements are full.  There is no scleral icterus.  Mouth and pharynx show less white spots.  No lymphadenopathy.  No carotid bruits.  The jugular venous pressure is normal.  Thyroid is not enlarged or tender.  Chest is clear to percussion and auscultation.  No rales or rhonchi.  Expansion of the chest is symmetrical.  Heart reveals no abnormal lift or heave.  First and second heart sounds are normal.  There is no murmur gallop rub or click.  The abdomen is soft and nontender.  Bowel sounds are normoactive.  There is no hepatosplenomegaly or mass.  There are no abdominal bruits.  Extremities reveal no phlebitis.  3+ edema of feet.  Pedal pulses are good.  There is no cyanosis or clubbing.  Neurologic exam is normal strength and no lateralizing weakness.  No sensory  deficits.  Integument reveals no rash  Lab Results:  Basename 10/14/11 0515 10/13/11 0500  WBC 14.7* 15.7*  HGB 9.4* 9.6*  PLT 66* 61*    Basename 10/14/11 0515 10/13/11 0500  NA 137 138  K 3.7 4.0  CL 99 101  CO2 33* 32  GLUCOSE 137* 136*  BUN 27* 24*  CREATININE 0.97 0.95   No results found for this basename: TROPONINI:2,CK,MB:2 in the last 72 hours Hepatic Function Panel No results found for this basename: PROT,ALBUMIN,AST,ALT,ALKPHOS,BILITOT,BILIDIR,IBILI in the last 72 hours No results found for this basename: CHOL in the last 72 hours No results found for this basename: PROTIME in the last 72 hours  Imaging: Imaging results have been reviewed  Cardiac Studies:  Assessment/Plan:  Patient Active Problem List  Diagnoses  . Post-op atrial fib      EKG yesterday showed NSR with first degree block.  Will reduce amiodarone to 200 mg BID which will be his discharge dose. Sore mouth      Mouth looks better but still sore.  Will continue mycostatin another day. Deconditioning      Continue Cardiac Rehab walk in hall  .   .   .   .   .   .   .   .   .   .   .   .   .   .   .   .   .   .   .   .   Marland Kitchen  LOS: 13 days    Cassell Clement 10/14/2011, 7:39 AM

## 2011-10-14 NOTE — Progress Notes (Signed)
CARDIAC REHAB PHASE I   PRE:  Rate/Rhythm: 94SR  BP:  Supine:   Sitting: 101/47  Standing:    SaO2: 94%RA  MODE:  Ambulation: 150 ft   POST:  Rate/Rhythem: 100sr  BP:  Supine:   Sitting: 122/60  Standing:    SaO2: 98%2L  Pt walked 150 ft on O2 at 2L with rolling walker and asst x 2. C/0 dizziness upon standing and had to sit down to rest.  Then able to walk with one rest break. Still shakey. Denied dizziness during walk. To chair with call bell' [phone]  Duanne Limerick

## 2011-10-15 DIAGNOSIS — I4891 Unspecified atrial fibrillation: Secondary | ICD-10-CM

## 2011-10-15 LAB — PROTIME-INR: Prothrombin Time: 25.9 seconds — ABNORMAL HIGH (ref 11.6–15.2)

## 2011-10-15 NOTE — Progress Notes (Signed)
CSW notified SNF of anticipated pt discharge on Friday. CSW will continue to follow to facilitate transfer to SNF.  Baxter Flattery, MSW [phone]

## 2011-10-15 NOTE — Progress Notes (Signed)
Physical Therapy Treatment Patient Details Name: [name] [name] MRN: [id number] DOB: 10-06-1931 Today's Date: 10/15/2011  PT Assessment/Plan  PT - Assessment/Plan Comments on Treatment Session: Patient was limited today secondary to dizziness with low BP.  Will continue PT to progress endurance and balance. PT Plan: Discharge plan remains appropriate;Frequency remains appropriate PT Frequency: Min 3X/week Follow Up Recommendations: Skilled nursing facility;24 hour supervision/assistance Equipment Recommended: Defer to next venue PT Goals  Acute Rehab PT Goals PT Goal: Supine/Side to Sit - Progress: Progressing toward goal PT Transfer Goal: Sit to Stand/Stand to Sit - Progress: Progressing toward goal PT Goal: Ambulate - Progress: Progressing toward goal PT Goal: Up/Down Stairs - Progress: Other (comment) PT Goal: Perform Home Exercise Program - Progress: Other (comment)  PT Treatment Precautions/Restrictions  Precautions Precautions: Fall;Sternal Required Braces or Orthoses: No Restrictions Weight Bearing Restrictions: No Mobility (including Balance) Bed Mobility Supine to Sit: 4: Min assist Supine to Sit Details (indicate cue type and reason): Needed cues for technique to follow sternal precautions. Transfers Sit to Stand: 4: Min assist;From elevated surface;With upper extremity assist;From bed Sit to Stand Details (indicate cue type and reason): max verbal cues for hand placement on knees.  Pt. only needed min assist however bed was raised considerably.  Placed pillows in recliner under geomat so pt. would not struggle to get up. Ambulation/Gait Ambulation/Gait Assistance: 4: Min assist Ambulation/Gait Assistance Details (indicate cue type and reason): Pt. c/o dizziness on arrival.  BP low.  Monitored with ambulation secondary to low BP.  Pt. took several standing rest breaks.  still shaky and not sure of self today.  Cut distance short secondary to pt. "just didn't feel  well". Ambulation Distance (Feet): 85 Feet Assistive device: Rolling walker Gait Pattern: Step-to pattern;Decreased step length - right Gait velocity: Slow cadence with more standing rest breaks. Stairs: No Corporate treasurer: No  Posture/Postural Control Posture/Postural Control: No significant limitations Balance Balance Assessed: No Exercise    End of Session PT - End of Session Equipment Utilized During Treatment: Gait belt Activity Tolerance: Other (comment) (Limited by dizziness) Patient left: in chair;with call bell in reach Nurse Communication: Mobility status for ambulation General Behavior During Session: [facility] for tasks performed Cognition: [facility] for tasks performed  INGOLD,Katieann Hungate 10/15/2011, 2:39 PM [facility] [phone] (pager)

## 2011-10-15 NOTE — Progress Notes (Signed)
Discussed in the long length of stay meeting [name] [name] Weeks 10/15/2011  

## 2011-10-15 NOTE — Progress Notes (Signed)
CARDIAC REHAB PHASE I   PRE:  Rate/Rhythm: 86SR  BP:  Supine:   Sitting: 112/54  Standing:    SaO2: 96%2L  MODE:  Ambulation: 300 ft   POST:  Rate/Rhythem: 100SRPVCs  BP:  Supine:   Sitting: 97/73  Standing:    SaO2: 97%RA Pt walked 300 ft on RA with asst x 2 and rolling walker with fairly steady gait. Stopped several times to rest. Pt. Tolerated well. Left off O2 and notified RN. No c/o dizziness. Weak. To chair after walk. [phone]  Duanne Limerick

## 2011-10-15 NOTE — Progress Notes (Signed)
Pt completed 3rd ambulation 300 ft with walker and NT on RA.  Tolerated well, took one break to cough.  Back to room in recliner with call bell in reach.  Will continue to monitor and encourage. Ave Filter

## 2011-10-15 NOTE — Progress Notes (Signed)
Subjective:  Patient feels better this am His mouth is less sore this am.  Objective:  Vital Signs in the last 24 hours: Temp:  [97 F (36.1 C)-97.6 F (36.4 C)] 97 F (36.1 C) (11/28 0546) Pulse Rate:  [81-92] 86  (11/28 0546) Resp:  [18-20] 18  (11/28 0546) BP: (93-115)/(55-66) 93/55 mmHg (11/28 0546) SpO2:  [86 %-100 %] 92 % (11/28 0546) Weight:  [201 lb 4.5 oz (91.3 kg)] 201 lb 4.5 oz (91.3 kg) (11/28 0546)  Intake/Output from previous day: 11/27 0701 - 11/28 0700 In: -  Out: 1625 [Urine:1625] Intake/Output from this shift:       . amiodarone  200 mg Oral BID  . collagenase   Topical Daily  . cycloSPORINE  1 drop Both Eyes Q12H  . feeding supplement  1 Container Oral TID WC  . furosemide  40 mg Oral BID  . metoprolol tartrate  25 mg Oral BID  . nystatin  5 mL Mouth/Throat QID  . pantoprazole  40 mg Oral QAC breakfast  . potassium chloride  30 mEq Oral Once  . potassium chloride  20 mEq Oral BID  . povidone-iodine  1 application Topical BID  . sodium chloride  10 mL Intracatheter Q12H  . warfarin  1 mg Oral q1800  . DISCONTD: amiodarone  400 mg Oral BID  . DISCONTD: warfarin  2 mg Oral q1800      Physical Exam: The patient appears to be in no distress.  Head and neck exam reveals that the pupils are equal and reactive.  The extraocular movements are full.  There is no scleral icterus.  Mouth and pharynx are benign.  No lymphadenopathy.  No carotid bruits.  The jugular venous pressure is normal.  Thyroid is not enlarged or tender.  Chest is clear to percussion and auscultation.  No rales or rhonchi.  Expansion of the chest is symmetrical.  Heart reveals no abnormal lift or heave.  First and second heart sounds are normal.  There is no murmur gallop rub or click.  The abdomen is soft and nontender.  Bowel sounds are normoactive.  There is no hepatosplenomegaly or mass.  There are no abdominal bruits.  Extremities reveal moderate edema.   Pedal pulses are good.   There is no cyanosis or clubbing.  Neurologic exam is normal strength and no lateralizing weakness.  No sensory deficits.  Integument reveals no rash  Lab Results:  Basename 10/14/11 0515 10/13/11 0500  WBC 14.7* 15.7*  HGB 9.4* 9.6*  PLT 66* 61*    Basename 10/14/11 0515 10/13/11 0500  NA 137 138  K 3.7 4.0  CL 99 101  CO2 33* 32  GLUCOSE 137* 136*  BUN 27* 24*  CREATININE 0.97 0.95   No results found for this basename: TROPONINI:2,CK,MB:2 in the last 72 hours Hepatic Function Panel No results found for this basename: PROT,ALBUMIN,AST,ALT,ALKPHOS,BILITOT,BILIDIR,IBILI in the last 72 hours No results found for this basename: CHOL in the last 72 hours No results found for this basename: PROTIME in the last 72 hours  Imaging: Imaging results have been reviewed.  Xray stable.  Cardiac Studies:  Assessment/Plan:  Patient Active Problem List  Diagnoses  . Post -op atrial fib      Telemetry this am shows atrial flutter/fib again.       Anticipate he will go home in AF and have later outpatient DCCV.      Continue amiodarone 200 BID Thrush      Patient states mouth  feels better. Continue mycostatin oral susp.  .   .   .   .   .   .   .   .   .   .   .   .   .   .   .   .   .   .   .   .   .     LOS: 14 days    Cassell Clement 10/15/2011, 7:28 AM

## 2011-10-15 NOTE — Progress Notes (Signed)
UR Completed.  [name] Jane [phone] 10/15/2011  

## 2011-10-15 NOTE — Progress Notes (Signed)
Pt ambulated 200 feet using rolling walker with O2 at 2LPM/[place] with rest in between,O2 sat post ambulation is 96 %.

## 2011-10-15 NOTE — Progress Notes (Addendum)
14 Days Post-Op Procedure(s) (LRB): MINIMALLY INVASIVE MITRAL VALVE REPAIR (MVR) (Right)  Subjective: Patient feeling better-mouth slightly less sore.   Objective: Vital signs in last 24 hours: Patient Vitals for the past 24 hrs:  BP Temp Temp src Pulse Resp SpO2 Weight  10/15/11 0546 93/55 mmHg 97 F (36.1 C) Oral 86  18  92 % 201 lb 4.5 oz (91.3 kg)  10/14/11 2111 115/66 mmHg 97.4 F (36.3 C) Oral 91  20  98 % -  10/14/11 1345 96/61 mmHg 97.6 F (36.4 C) Oral 81  20  100 % -  10/14/11 1300 - - - 92  - 86 % -   Pre op weight 91.6 kg Current Weight  10/15/11 201 lb 4.5 oz (91.3 kg)        Intake/Output from previous day: 11/27 0701 - 11/28 0700 In: -  Out: 1625 [Urine:1625]   Physical Exam:  Cardiovascular: IRRR IRRR, no murmurs, gallops, or rubs. Pulmonary: Slightly decreased at bases R>L ; no rales, wheezes, or rhonchi. Abdomen: Soft, non tender, bowel sounds present. Extremities:Trace bilateral lower extremity edema. Wounds: Clean and dry.  No erythema or signs of infection.  Lab Results: CBC:  Basename 10/14/11 0515 10/13/11 0500  WBC 14.7* 15.7*  HGB 9.4* 9.6*  HCT 29.2* 29.7*  PLT 66* 61*   BMET:   Basename 10/14/11 0515 10/13/11 0500  NA 137 138  K 3.7 4.0  CL 99 101  CO2 33* 32  GLUCOSE 137* 136*  BUN 27* 24*  CREATININE 0.97 0.95  CALCIUM 8.3* 8.3*    PT/INR:   Basename 10/15/11 0516  LABPROT 25.9*  INR 2.32*   ABG:  INR: Will add last result for INR, ABG once components are confirmed Will add last 4 CBG results once components are confirmed  Assessment/Plan:  1. CV - Aflutter/ AFIB and some PVCs. Continue Amiodarone 200 bid and  Lopressor 25 bid. Will decrease Coumadin to 1 mg tonight (EPW need to be removed). Will probably need DCCV as outpatient (if does not convert to SR) 2.  Pulmonary - Encourage incentive spirometer. 3. Volume Overload - Continue with diuresis. On Lasix 40 bid. Almost at pre op weight. Will  decrease to daily  upon discharge. 4.  Acute blood loss anemia -H/H stable at 9.4/29.2. 5. Continue mycostatin for probable thrush. 6.Patient has an ulcer on buttock. Continue wound care.   8.Continue CRPI/PT. 7. To SNF likely Friday.   ZIMMERMAN,DONIELLE M  I have seen and examined the patient and agree with the assessment and plan as outlined, except for the fact that Mr. Januszewski is clearly in sinus rhythm today.  This was confirmed with atrial lead ECG at bedside using his pacing wires.  His areas of skin breakdown on his buttocks have apparently not been getting daily dressing changes.  Although they remain shallow, he has fairly large areas of decubitus ulceration with surrounding erythema.  I think he would benefit from daily hydrotherapy in addition to the current dressing regimen recommended by the wound care team.  He will need very careful and extensive plans for management and follow up of these wounds as an outpatient if he is to be d/c'd to SNF.  If satisfactory plans are in place he potentially could go by this Friday.  Dillen Belmontes H

## 2011-10-16 LAB — CBC
MCH: 29.3 pg (ref 26.0–34.0)
MCHC: 32 g/dL (ref 30.0–36.0)
MCV: 91.7 fL (ref 78.0–100.0)
Platelets: 58 10*3/uL — ABNORMAL LOW (ref 150–400)
RBC: 3.24 MIL/uL — ABNORMAL LOW (ref 4.22–5.81)
RDW: 15 % (ref 11.5–15.5)

## 2011-10-16 LAB — PROTIME-INR: Prothrombin Time: 26.2 seconds — ABNORMAL HIGH (ref 11.6–15.2)

## 2011-10-16 MED ORDER — PANTOPRAZOLE SODIUM 40 MG PO TBEC: 40.0000 mg | DELAYED_RELEASE_TABLET | Freq: Every day | ORAL | Status: DC

## 2011-10-16 MED ORDER — AMIODARONE HCL 200 MG PO TABS: 200.0000 mg | ORAL_TABLET | Freq: Two times a day (BID) | ORAL | Status: DC

## 2011-10-16 MED ORDER — ENSURE PUDDING PO PUDG: 1.0000 | Freq: Three times a day (TID) | ORAL | Status: DC

## 2011-10-16 MED ORDER — WARFARIN SODIUM 2 MG PO TABS: 2.0000 mg | ORAL_TABLET | Freq: Every day | ORAL | Status: DC

## 2011-10-16 MED ORDER — WARFARIN SODIUM 1 MG PO TABS: 1.0000 mg | ORAL_TABLET | Freq: Every day | ORAL | Status: DC

## 2011-10-16 MED ORDER — ASCORBIC ACID 500 MG PO TABS: 500.0000 mg | ORAL_TABLET | Freq: Every day | ORAL | Status: DC

## 2011-10-16 MED ORDER — FUROSEMIDE 40 MG PO TABS
40.0000 mg | ORAL_TABLET | Freq: Every day | ORAL | Status: DC
  Administered 2011-10-17 – 2011-10-18 (×2): 40 mg via ORAL
  Filled 2011-10-16 (×3): qty 1

## 2011-10-16 MED ORDER — OXYCODONE HCL 5 MG PO TABS: 5.0000 mg | ORAL_TABLET | ORAL | Status: DC | PRN

## 2011-10-16 MED ORDER — NYSTATIN 100000 UNIT/ML MT SUSP: 5.0000 mL | Freq: Four times a day (QID) | OROMUCOSAL | Status: AC

## 2011-10-16 MED ORDER — VITAMIN C 500 MG PO TABS
500.0000 mg | ORAL_TABLET | Freq: Every day | ORAL | Status: DC
  Administered 2011-10-16 – 2011-11-01 (×17): 500 mg via ORAL
  Filled 2011-10-16 (×17): qty 1

## 2011-10-16 MED ORDER — METOPROLOL TARTRATE 25 MG PO TABS: 25.0000 mg | ORAL_TABLET | Freq: Two times a day (BID) | ORAL | Status: DC

## 2011-10-16 MED ORDER — POTASSIUM CHLORIDE CRYS ER 20 MEQ PO TBCR: 20.0000 meq | EXTENDED_RELEASE_TABLET | Freq: Every day | ORAL | Status: DC

## 2011-10-16 MED ORDER — TRAZODONE HCL 150 MG PO TABS
300.0000 mg | ORAL_TABLET | Freq: Every day | ORAL | Status: DC
  Filled 2011-10-16: qty 2

## 2011-10-16 MED ORDER — COLLAGENASE 250 UNIT/GM EX OINT: TOPICAL_OINTMENT | Freq: Every day | CUTANEOUS | Status: AC

## 2011-10-16 MED ORDER — POTASSIUM CHLORIDE CRYS ER 20 MEQ PO TBCR
20.0000 meq | EXTENDED_RELEASE_TABLET | Freq: Every day | ORAL | Status: DC
  Administered 2011-10-17 – 2011-10-18 (×2): 20 meq via ORAL
  Filled 2011-10-16 (×3): qty 1

## 2011-10-16 MED ORDER — FUROSEMIDE 40 MG PO TABS: 40.0000 mg | ORAL_TABLET | Freq: Every day | ORAL | Status: DC

## 2011-10-16 MED ORDER — THERA M PLUS PO TABS: 1.0000 | ORAL_TABLET | Freq: Every day | ORAL | Status: DC

## 2011-10-16 MED ORDER — TRAZODONE HCL 150 MG PO TABS
150.0000 mg | ORAL_TABLET | Freq: Every day | ORAL | Status: DC
  Administered 2011-10-16 – 2011-10-31 (×16): 150 mg via ORAL
  Filled 2011-10-16 (×17): qty 1

## 2011-10-16 MED ORDER — THERA M PLUS PO TABS
1.0000 | ORAL_TABLET | Freq: Every day | ORAL | Status: DC
  Administered 2011-10-16 – 2011-11-01 (×17): 1 via ORAL
  Filled 2011-10-16 (×17): qty 1

## 2011-10-16 NOTE — Progress Notes (Signed)
PT ambulated 347ft with walker on room air with standby assistance.  Pt tolerated well with no complaints. Wound dressing falling off, cleaned and covered with santly and Mepilex per MD order. Call light within reach. Family with pt. Will continue to monitor. Kalman Drape, RN

## 2011-10-16 NOTE — Progress Notes (Signed)
Pt signed release of information to allow CSW to coordinate with pt mental health provider, Dr Betti Cruz of [facility], as CSW aware that Trazadone iscontraindicated for pts with heart problems. PA made aware of concerns. CSW  left message for Dr Betti Cruz and will follow up.  Baxter Flattery, MSW [phone]

## 2011-10-16 NOTE — Progress Notes (Signed)
Physical Therapy Wound Treatment Patient Details  Name: [name] [name] MRN: [id number] Date of Birth: 06-04-1931  Today's Date: 10/16/2011 Time: 0950-1007 Time Calculation (min): 17 min  Subjective  Subjective: Pt states the wound doesn't hurt much. Patient and Family Stated Goals: Get better Date of Onset:  (this hospitalization) Prior Treatments: dressing changes  Pain Score: Pain Score:   3  Wound Assessment  Pressure Ulcer 10/06/11 Stage I -  Intact skin with non-blanchable redness of a localized area usually over a bony prominence. Baseball sized reddened area on both sides of buttocks, non blanchable, skin irritation. (Active)  State of Healing Non-healing 10/16/2011 10:00 AM  Site / Wound Assessment Red;Yellow 10/16/2011 10:00 AM  % Wound base Red or Granulating 75% 10/16/2011 10:00 AM  % Wound base Yellow 25% 10/16/2011 10:00 AM  Peri-wound Assessment Denuded;Excoriated 10/16/2011 10:00 AM  Wound Length (cm) 8 cm 10/16/2011 10:00 AM  Wound Width (cm) 4 cm 10/16/2011 10:00 AM  Wound Depth (cm) 0.1 cm 10/16/2011 10:00 AM  Drainage Amount Moderate 10/16/2011 10:00 AM  Drainage Description Serous 10/16/2011 10:00 AM  Treatment Hydrotherapy (Pulse lavage) 10/16/2011 10:00 AM  Dressing Type Barrier Film (skin prep);Moist to dry 10/16/2011 10:00 AM  Dressing Changed 10/16/2011 10:00 AM         Wound Assessment and Plan  Wound Therapy - Assess/Plan/Recommendations Wound Therapy - Clinical Statement: Pt with buttock wound consisting of broken are with yellow slough present.  Will continue hydrotherapy to cleanse wound and promote healing. Wound Therapy - Functional Problem List: Decr. sitting due to buttock wound. Factors Delaying/Impairing Wound Healing: Multiple medical problems;Polypharmacy Hydrotherapy Plan: Patient/family education;Pulsatile lavage with suction;Dressing change;Debridement Wound Therapy - Frequency: 6X / week Wound Therapy - Follow Up Recommendations:  Skilled nursing facility  Wound Therapy Goals- Improve the function of patient's integumentary system by progressing the wound(s) through the phases of wound healing (inflammation - proliferation - remodeling) by: Decrease Necrotic Tissue to: 0% Decrease Necrotic Tissue - Progress: Not met Increase Granulation Tissue to: 100% Increase Granulation Tissue - Progress: Not met Patient/Family will be able to : verbalize positioning Patient/Family Instruction Goal - Progress: Not met Goals/treatment plan/discharge plan were made with and agreed upon by patient/family: Yes Time For Goal Achievement: 7 days Wound Therapy - Potential for Goals: Good  Goals will be updated until maximal potential achieved or discharge criteria met.  Discharge criteria: when goals achieved, discharge from hospital, MD decision/surgical intervention, no progress towards goals, refusal/missing three consecutive treatments without notification or medical reason.  Reed Dady 10/16/2011, 10:20 AM [name] PT [phone]

## 2011-10-16 NOTE — Progress Notes (Signed)
Pt c/o trouble breathing and nose congestion at 0640. SaO2 95% on room air. Oxygen applied with water at [facility] for pt comfort. Positioned on side of bed. Still complain of nasal congestion. Heart rhythm convert from 1st degree to afib HR 80's. Pt is going in and out of NSR and afib controlled rate (pt has previous documentation of this). Pt has no symptoms or other complaints. Pt has a productive cough white scant white mucus. Will alert doctor to pt request for nasal decongestant. Will continue to monitor. Kalman Drape, RN

## 2011-10-16 NOTE — Progress Notes (Addendum)
15 Days Post-Op Procedure(s) (LRB): MINIMALLY INVASIVE MITRAL VALVE REPAIR (MVR) (Right)  Subjective: Patient with a "stuffy nose", on bedside commode to have a bowel movement.  He feels "down" and is requesting that he be given Trazodone as taken pre op.  Objective: Vital signs in last 24 hours: Patient Vitals for the past 24 hrs:  BP Temp Temp src Pulse Resp SpO2 Weight  10/16/11 0504 103/67 mmHg 97.7 F (36.5 C) Oral 86  18  92 % 205 lb 12.8 oz (93.35 kg)  10/15/11 2122 101/68 mmHg - - - - - -  10/15/11 2100 103/63 mmHg 97.5 F (36.4 C) Oral 93  18  95 % -  10/15/11 1434 97/73 mmHg 97.6 F (36.4 C) Oral 86  18  91 % -  10/15/11 1142 107/75 mmHg - - 94  - 86 % -  10/15/11 1128 94/51 mmHg - - 93  - 94 % -  10/15/11 1023 102/52 mmHg - - - - - -   Pre op weight 91.6 kg Current Weight  10/16/11 205 lb 12.8 oz (93.35 kg)        Intake/Output from previous day: 11/28 0701 - 11/29 0700 In: 960 [P.O.:960] Out: 1700 [Urine:1700]   Physical Exam:  Cardiovascular: IRRR IRRR, no murmurs, gallops, or rubs. Pulmonary: Slightly decreased at bases R>L ; no rales, wheezes, or rhonchi. Abdomen: Soft, non tender, bowel sounds present. Extremities:Trace bilateral lower extremity edema. Wounds: Clean and dry.  No erythema or signs of infection.  Lab Results: CBC:  Basename 10/16/11 0622 10/14/11 0515  WBC 13.3* 14.7*  HGB 9.5* 9.4*  HCT 29.7* 29.2*  PLT 58* 66*   BMET:   Basename 10/14/11 0515  NA 137  K 3.7  CL 99  CO2 33*  GLUCOSE 137*  BUN 27*  CREATININE 0.97  CALCIUM 8.3*    PT/INR:   Basename 10/16/11 0622  LABPROT 26.2*  INR 2.36*   ABG:  INR: Will add last result for INR, ABG once components are confirmed Will add last 4 CBG results once components are confirmed  Assessment/Plan:  1. CV - AFIB this am . Continue Amiodarone 200 bid and Lopressor 25 bid. Will hold Coumadin tonight (EPW need to be removed). Will probably need DCCV as outpatient (if does  not convert to SR).   2.  Pulmonary - Encourage incentive spirometer. 3. Volume Overload - Continue with diuresis. On Lasix 40 bid. Below pre op weight. Will  decrease to daily. 4.  Acute blood loss anemia -H/H stable at 9.5/29.7. 5. Continue mycostatin for probable thrush. 6.Patient has an ulcer on buttock. Continue wound care.   7.Thrombocytopenia-Platelets slightly decreased from 66 to 58,000. Patient with a history of this. 8.Continue CRPI/PT. 9.WBC continues to decrease-14.7 to 13.3.  Remains afebrile.  Possible source is ulcers on buttocks as no wound infection. 10.Patient requesting Trazodone, which he has taken pre op. Will re order at a reduced dose (previously has led to low bp) 11.Possible to SNF in am.   I have seen and examined the patient and agree with the assessment and plan as outlined.  Will need to d/c pacing wires.  Rhythm is currently sinus with PAC's, not A-flutter/A-fib.  This has been confirmed with atrial lead ECG using pacing wires, which will need to be removed in am  [name],[name]  5:40 PM

## 2011-10-16 NOTE — Progress Notes (Signed)
CARDIAC REHAB PHASE I   PRE:  Rate/Rhythm: 84  BP:  Supine:  Sitting: 119/59  Standing:    SaO2: 99% 2l  MODE:  Ambulation: 350 ft   POST:  Rate/Rhythem: 113  BP:  Supine:   Sitting: 119/65  Standing:    SaO2: 96 RA  0855 - 0925 Pt ambulated with walker and 2 assist. Tolerated fairly well. Stopped to rest x2. Request O2 back on after walk. Pt request referral for out pt card rehab [facility].   Rosalie Doctor

## 2011-10-16 NOTE — Progress Notes (Signed)
Subjective:  Strength is increasing slowly. Somewhat depressed.  Agree with restarting trazadone. Mouth is better.  Objective:  Vital Signs in the last 24 hours: Temp:  [97.5 F (36.4 C)-97.7 F (36.5 C)] 97.7 F (36.5 C) (11/29 0504) Pulse Rate:  [86-94] 86  (11/29 0504) Resp:  [18] 18  (11/29 0504) BP: (94-107)/(51-75) 103/67 mmHg (11/29 0504) SpO2:  [86 %-95 %] 92 % (11/29 0504) Weight:  [205 lb 12.8 oz (93.35 kg)] 205 lb 12.8 oz (93.35 kg) (11/29 0504)  Intake/Output from previous day: 11/28 0701 - 11/29 0700 In: 960 [P.O.:960] Out: 1700 [Urine:1700] Intake/Output from this shift:       . amiodarone  200 mg Oral BID  . collagenase   Topical Daily  . cycloSPORINE  1 drop Both Eyes Q12H  . feeding supplement  1 Container Oral TID WC  . furosemide  40 mg Oral Daily  . metoprolol tartrate  25 mg Oral BID  . nystatin  5 mL Mouth/Throat QID  . pantoprazole  40 mg Oral QAC breakfast  . potassium chloride  20 mEq Oral Daily  . povidone-iodine  1 application Topical BID  . traZODone  300 mg Oral QHS  . warfarin  1 mg Oral q1800  . DISCONTD: furosemide  40 mg Oral BID  . DISCONTD: potassium chloride  20 mEq Oral BID  . DISCONTD: sodium chloride  10 mL Intracatheter Q12H  . DISCONTD: warfarin  1 mg Oral q1800      Physical Exam: The patient appears to be in no distress.  Head and neck exam reveals that the pupils are equal and reactive.  The extraocular movements are full.  There is no scleral icterus.  Mouth and pharynx are benign.  No lymphadenopathy.  No carotid bruits.  The jugular venous pressure is normal.  Thyroid is not enlarged or tender.  Chest is clear to percussion and auscultation.  No rales or rhonchi.  Expansion of the chest is symmetrical.  Heart reveals no abnormal lift or heave.  First and second heart sounds are normal.  There is no murmur gallop rub or click.  The abdomen is soft and nontender.  Bowel sounds are normoactive.  There is no  hepatosplenomegaly or mass.  There are no abdominal bruits.  Extremities reveal moderate edema.  Pedal pulses are good.    Neurologic exam is normal strength and no lateralizing weakness.  No sensory deficits.  Integument reveals no rash  Lab Results:  Basename 10/16/11 0622 10/14/11 0515  WBC 13.3* 14.7*  HGB 9.5* 9.4*  PLT 58* 66*    Basename 10/14/11 0515  NA 137  K 3.7  CL 99  CO2 33*  GLUCOSE 137*  BUN 27*  CREATININE 0.97   No results found for this basename: TROPONINI:2,CK,MB:2 in the last 72 hours Hepatic Function Panel No results found for this basename: PROT,ALBUMIN,AST,ALT,ALKPHOS,BILITOT,BILIDIR,IBILI in the last 72 hours No results found for this basename: CHOL in the last 72 hours No results found for this basename: PROTIME in the last 72 hours  Imaging: Imaging results have been reviewed  Cardiac Studies:  Assessment/Plan:  Patient Active Problem List  Diagnoses  . Post-op atrial flutter      Continue current Rx Thrush      Continue mycostatin. He may also have some element of nutritional glossitis.  Will add multivitamin and Vitamin C  .   .   .   .   .   .   .   .   .   .   .   .   .   .   .   .   .   .   .   .   Marland Kitchen  LOS: 15 days    Cassell Clement 10/16/2011, 9:04 AM

## 2011-10-16 NOTE — Discharge Summary (Signed)
Physician Discharge Summary  Patient ID: [name] [name] MRN: [id number] DOB/AGE: January 20, 1931 77 y.o.  Admit date: 10/01/2011 Discharge date: 10/17/2011  Admission Diagnoses:   1.Severe mitral regurgitation secondary to mitral valve prolapse 2.History of hypertension 3.History of hypercholesterolemia 4.History of thrombocytopenia 5.History of prostate cancer 6.History of melanoma of the left shoulder 7.History of colon polyps 8.History of GERD  Discharge Diagnoses:   1.Severe mitral regurgitation secondary to mitral valve prolapse 2.History of hypertension 3.History of hypercholesterolemia 4.History of thrombocytopenia 5.History of prostate cancer 6.History of melanoma of the left shoulder 7.History of colon polyps 8.History of GERD 9.Post op afib/a flutter 10.Right pneumothorax 11.Acute blood loss anemia 12.Decubitus ulcer   Procedures:  1: Mini mitral valve repair then converted to median sternotomy (please see operative report for details) on 10/01/2011 Complex valvuloplasty including artificial Gore-tex neochord placement x4, chordal transposition x1, chordal release x1  # 32 mm Sorin Memo 3D Ring Annuloplasty by Dr. Cornelius Moras 2: Right chest tube on 10/05/2011 and 10/06/2011 by Dr. Cornelius Moras                           Discharged Condition: Improved  History of Presenting Illness: Patient is a 77 year old retired Caucasian male with history of mitral valve prolapse first diagnosed at age 20. For years, he has been followed here locally by Dr. Patty Sermons. The patient reports that he was in his usual state of health until approximately 3 weeks ago when he developed new onset of exertional shortness of breath. An echocardiogram was performed 08/22/2011 demonstrating severe mitral regurgitation. Previous echocardiogram had demonstrated only mild mitral regurgitation. The patient subsequently was scheduled for elective left and right heart catheterization. This was performed October 25  and notable for the presence of normal coronary artery anatomy with no significant coronary artery disease. There was normal left ventricular systolic function with severe mitral regurgitation and moderate pulmonary hypertension. The patient was seen and evaluated for mitral valve repair vs replacement in the office by Dr. Cornelius Moras initially on 09/15/2011. A long discussion was held the patient and his daughter regarding potential risks, complications, and benefits of the surgery. Alternative surgical approaches (a comparison between conventional sternotomy and minimally invasive )as well as a mechanical vs bioprosthetic tissue valve were also discussed.  Ultimately, the patient wished to proceed with a minimally invasive technique and if the valve was unable to be repaired he preferred to have a bioprosthetic tissue valve and not a mechanical valve. Patient was admitted to [facility] on 10/01/2011 in order to undergo mitral valve repair.  Hospital Course: He remained afebrile and hemodynamically stable. He was able to be extubated successfully the morning of postoperative day #1. Swan-Ganz, A-line, some of his chest tubes, and Foley were removed fairly early in his postoperative course. He was on dopamine post op but this was able to be weaned off. He was found to be volume overloaded and initially placed on a Lasix drip. He did have a brief episode of atrial fibrillation the evening of surgery and initially had none thereafter (he was on amiodarone by mouth); however, he did go back into atrial fibrillation with a controlled ventricular rate.  He was given an amiodarone bolus. He was then PAF for a couple of days. He was found to have a small right pneumothorax and subcutaneous emphysema on 10/04/2011. His right chest tube was still in as it had a fair amount of output for several days.  A second right chest tube was  placed by Dr. Cornelius Moras secondary  to increased subcutaneous emphysema. The patient continued to have  increasing subcutaneous air despite 2 functioning right chest tubes. A CAT scan of the chest showed a small right anterior pneumothorax to the right of the middle lobe, extensive subcutaneous air. As a result, Dr. Cornelius Moras placed a an anterior right chest tube on 10/06/2011.       Patient was found to have thrombocytopenia (has a history of this). His platelet count went as low as 26,000.  HIT panel was ordered and the patient's results were 0.538 ( normal was 0.4). It should be noted that he did not have any clinical signs of HIT.  He was felt surgically stable for transfer from the intensive care unit to [facility] for further convalescence. Gradually, his subcutaneous emphysema lessened and he had no air leak from his right chest tubes.  Eventually,over the next couple of days,  all the chest tubes were removed. He was found have acute blood loss anemia his last H&H was up to 9.5 and 29.7 respectively. He was also found to have leukocytosis but remained afebrile. He  had no signs of a wound infection. He did have cough with sputum production. Sputum culture results are not available yet. UA was essentially negative. He did have several areas of skin breakdown (decubitus ulcers) on his buttocks. A wound consult was obtained and Santyl  was applied. He did develop a sore mouth and was felt to have thrush. He was placed on Mycostatin with improvement. The patient was weak and fairly deconditioned. He did gradually get stronger and was able to ambulate better with cardiac rehabilitation over the next couple of days.        He currently remains in sinus rhythm. He was initially going to be discharged on Friday, 11/30/201;however, he did require hydrotherapy for his decubitus ulcer. This was unable to be arranged. As a result, the patient received hydrotherapy and dressing changes daily at [facility]. His decubitus ulcer continued to improve. The skin became macerated on his right groin wound. Dry 4 x 4's are to be placed  daily on this area. Provided he remains afebrile, hemodynamically stable, and pending morning round evaluation, he will be surgically stable for discharge to the SNF on Monday, 10/20/2011.  Recent vital signs:  Filed Vitals:   10/19/2011  BP: 96/55  Pulse: 82  Temp: 97.8 F (36.9 C)  Resp: 18    Discharge Exam: Blood pressure 96/65, pulse 82, temperature 97.8 F (36.9 C), temperature source Oral, resp. rate 18, height 6\' 5"  (1.956 m), weight 199 lb 15.3 oz (90.7 kg), SpO2 90.00%.  Physical Exam:  Cardiovascular: RRR, no murmurs, gallops, or rubs.  Pulmonary: Slightly decreased at bases R>L ; no rales, wheezes, or rhonchi.  Abdomen: Soft, non tender, bowel sounds present.  Extremities:Trace bilateral lower extremity edema.  Wounds: Clean and dry. No erythema or signs of infection.  Decubitus ulcers present on buttocks.   Recent laboratory studies:  Lab Results  Component Value Date   WBC 13.3* 10/16/2011   HGB 9.5* 10/16/2011   HCT 29.7* 10/16/2011   MCV 91.7 10/16/2011   PLT 58* 10/16/2011   Lab Results  Component Value Date   NA 137 10/14/2011   K 3.7 10/14/2011   CL 99 10/14/2011   CO2 33* 10/14/2011   CREATININE 0.97 10/14/2011   GLUCOSE 137* 10/14/2011      Diagnostic Studies: Dg Chest 2 View  10/14/2011  *RADIOLOGY REPORT*  Clinical Data: Coronary  bypass  CHEST - 2 VIEW  Comparison: 10/11/2011  Findings: Left PICC line tip extends to the right atrium.  Heart remains enlarged with residual vascular congestion, basilar atelectasis and small effusions, slightly worse on the left.  No enlarging pneumothorax.  Residual subcutaneous air bilaterally.  No significant interval change. Stable aeration pattern.  IMPRESSION: Stable postoperative findings.  No significant interval change.  Original Report Authenticated By: Judie Petit. Ruel Favors, M.D.      Discharge Medications:   Current Discharge Medication List    START taking these medications   Details  collagenase  (SANTYL) ointment Apply topically daily. To buttocks. Qty: 15 g    feeding supplement (ENSURE) PUDG Take 1 Container by mouth 3 (three) times daily with meals.    furosemide (LASIX) 40 MG tablet Take 1 tablet (40 mg total) by mouth daily.    metoprolol tartrate (LOPRESSOR) 25 MG tablet Take 1 tablet (25 mg total) by mouth 2 (two) times daily.    Multiple Vitamins-Minerals (MULTIVITAMINS THER. W/MINERALS) TABS Take 1 tablet by mouth daily. Qty: 30 each    nystatin (MYCOSTATIN) 100000 UNIT/ML suspension Take 5 mLs (500,000 Units total) by mouth 4 (four) times daily. Take for mouth soreness. Qty: 60 mL    Ultram 50 mg  Take one or two every 4 hours as needed for pain (Maximum dose=6 tablets per day)  pantoprazole (PROTONIX) 40 MG tablet Take 1 tablet (40 mg total) by mouth daily before breakfast.    potassium chloride SA (K-DUR,KLOR-CON) 20 MEQ tablet Take 1 tablet (20 mEq total) by mouth daily.    vitamin C (VITAMIN C) 500 MG tablet Take 1 tablet (500 mg total) by mouth daily.    warfarin (COUMADIN) 2 MG tablet Take 1 tablet (2 mg total) by mouth daily at 6 PM. Or as directed by Dr. Elvis Coil office.      CONTINUE these medications which have CHANGED   Details  amiodarone (PACERONE) 200 MG tablet Take 1 tablet (200 mg total) by mouth 2 (two) times daily.      CONTINUE these medications which have NOT CHANGED   Details  atorvastatin (LIPITOR) 10 MG tablet Take 10 mg by mouth daily.        RESTASIS 0.05 % ophthalmic emulsion Place 1 drop into both eyes every 12 (twelve) hours.     !! sodium chloride (MURO 128) 5 % ophthalmic ointment Place 1 drop into both eyes at bedtime.      !! Sodium Chloride, Hypertonic, (MURO 128 OP) Apply 1 drop to eye 2 (two) times daily.     traZODone (DESYREL) 100 MG tablet Take 150 mg by mouth At bedtime.    aspirin EC 81 MG tablet Take 81 mg by mouth daily.       !! - Potential duplicate medications found. Please discuss with provider.    STOP  taking these medications     acetaminophen (TYLENOL) 500 MG tablet      celecoxib (CELEBREX) 200 MG capsule      metoprolol (TOPROL-XL) 100 MG 24 hr tablet               lisinopril (Prinivil,Zestril) 10 mg    Disposition:Skilled nursing facility  Discharge Instructions: Discharge Orders    Future Appointments: Provider: Department: Dept Phone: Center:   10/27/2011 4:15 PM [name], MD [facility] [phone] [facility]      Follow Up Appointments: Follow-up Information    Follow up with SNF. (PT/INR needs to be drawn on Monday  10/20/2011 and results faxed to Dr. Elvis Coil office)       Follow up with Peter [place], MD. Make an appointment in 2 weeks. (Call for an appointment for 2 weeks)    Contact information:   [address] [place] [address] [phone]       Follow up with [name], MD on 10/27/2011. (PA/LAT CXR to be taken 10/27/2011 at 3:45 pm;Appointment with Dr. Cornelius Moras is on 10/27/2011 at 4:15 pm)    Contact information:   [address] [place] [address] [phone]           Signed: Ardelle Balls 10/16/2011, 12:37 PM

## 2011-10-17 LAB — PROTIME-INR: Prothrombin Time: 22.6 seconds — ABNORMAL HIGH (ref 11.6–15.2)

## 2011-10-17 MED ORDER — TRAMADOL HCL 50 MG PO TABS: 50.0000 mg | ORAL_TABLET | ORAL | Status: AC | PRN

## 2011-10-17 MED ORDER — WARFARIN SODIUM 2 MG PO TABS: 2.0000 mg | ORAL_TABLET | Freq: Every day | ORAL | Status: DC

## 2011-10-17 MED ORDER — TRAZODONE HCL 100 MG PO TABS: 150.0000 mg | ORAL_TABLET | Freq: Every day | ORAL | Status: DC

## 2011-10-17 MED ORDER — WARFARIN SODIUM 2 MG PO TABS
2.0000 mg | ORAL_TABLET | Freq: Every day | ORAL | Status: DC
  Administered 2011-10-17: 2 mg via ORAL
  Filled 2011-10-17 (×2): qty 1

## 2011-10-17 MED ORDER — WARFARIN SODIUM 2 MG PO TABS
2.0000 mg | ORAL_TABLET | Freq: Every day | ORAL | Status: DC
  Filled 2011-10-17: qty 1

## 2011-10-17 MED ORDER — TRAZODONE HCL 150 MG PO TABS: 150.0000 mg | ORAL_TABLET | Freq: Every day | ORAL | Status: DC

## 2011-10-17 NOTE — Progress Notes (Addendum)
16 Days Post-Op Procedure(s) (LRB): MINIMALLY INVASIVE MITRAL VALVE REPAIR (MVR) (Right)  Subjective: Patient had a fall earlier this am. He apparently slid out of bed onto his buttocks.  He did not hit his head.  Objective: Vital signs in last 24 hours: Patient Vitals for the past 24 hrs:  BP Temp Temp src Pulse Resp SpO2 Weight  10/17/11 0545 98/62 mmHg 97.1 F (36.2 C) Oral 96  16  94 % 202 lb 13.2 oz (92 kg)  10/16/11 2034 103/59 mmHg 98.2 F (36.8 C) Oral 89  18  93 % -  10/16/11 1404 99/52 mmHg 97.9 F (36.6 C) Oral 80  18  94 % -   Pre op weight 91.6 kg Current Weight  10/17/11 202 lb 13.2 oz (92 kg)        Intake/Output from previous day: 11/29 0701 - 11/30 0700 In: 960 [P.O.:960] Out: 300 [Urine:300]   Physical Exam:  Cardiovascular: RRR, no murmurs, gallops, or rubs. Pulmonary: Slightly decreased at bases R>L ; no rales, wheezes, or rhonchi. Abdomen: Soft, non tender, bowel sounds present. Extremities:No real lower extremity edema. Wounds: Clean and dry.  No erythema or signs of infection. Ecchymosis of right upper chest wall (has been decreasing over the last couple of days) Neurological:Grossly intact without focal deficits.  Lab Results: CBC:  Basename 10/16/11 0622  WBC 13.3*  HGB 9.5*  HCT 29.7*  PLT 58*   BMET:  No results found for this basename: NA:2,K:2,CL:2,CO2:2,GLUCOSE:2,BUN:2,CREATININE:2,CALCIUM:2 in the last 72 hours  PT/INR:   Basename 10/17/11 0520  LABPROT 22.6*  INR 1.95*   ABG:  INR: Will add last result for INR, ABG once components are confirmed Will add last 4 CBG results once components are confirmed  Assessment/Plan:  1. CV - Previous Afib/flutter with CVR. SR with first degree AV block  this am . Continue Amiodarone 200 bid.Lso on Lopressor 25 bid and SBP has been in the low 100's. Will give Coumadin 2 mg po every evening. 2. Pulmonary - Encourage incentive spirometer. 3. Volume Overload - Continue with daily  diuresis.    4.  Acute blood loss anemia -H/H stable at 9.5/29.7. 5. Continue mycostatin for probable thrush. 6.Patient has an ulcer on buttock. Continue wound care.   7.Thrombocytopenia-Platelets slightly decreased from 66 to 58,000. Patient with a history of this. 8.Continue CRPI/PT. 9.WBC continues to decrease-14.7 to 13.3.  Remains afebrile.  Possible source is ulcers on buttocks as no wound infection. 10.Remove EPW and ct sutures. 11.Possible to SNF today if hydrotherapy can be arranged for his decubitus ulcers.    I have seen and examined the patient and agree with the assessment and plan as outlined.  Mr Caba is ready for d/c to SNF as long as outpatient hydrotherapy for buttocks wounds can be arranged.  Jd Mccaster H  8:31 AM

## 2011-10-17 NOTE — Progress Notes (Signed)
EPW and CTS DCd per MD order and protocol.  No bleeding or ectopy noted, VSS, pt tolerated well.  Steri strips and betadine applied, pt instructed to remain in bed for 1 hr and to call with any questions or concerns.  Call bell in reach, will continue to monitor closely. Ave Filter

## 2011-10-17 NOTE — Progress Notes (Signed)
Pt stated that he "He fell and yelled and someone came in". The IV team nurse found the pt. Pt denies any pain at this time. Kalman Drape

## 2011-10-17 NOTE — Progress Notes (Signed)
Dr. Tressie Stalker informed of pt fall and status. No orders received. Pt still denies any pain or changes. Assessment still consistent with previous. Told pt to call for assistance with voiding. Chair alarm still activated. Will continue to monitor. Kalman Drape

## 2011-10-17 NOTE — Progress Notes (Signed)
At 0615 the patients daughter, Graciella Freer, was given all the details about the patients incident. She was pleasant and asked to be updated if the patients discharge plans changed or if the patient had any changes. Will pass along to the day nurse.  Kalman Drape

## 2011-10-17 NOTE — Progress Notes (Signed)
Subjective:  Patient slipped out of bed last night but no injury. Rhythm is NSR with first degree block this am. No chest pain.  Objective:  Vital Signs in the last 24 hours: Temp:  [97.1 F (36.2 C)-98.2 F (36.8 C)] 97.1 F (36.2 C) (11/30 0545) Pulse Rate:  [80-96] 96  (11/30 0545) Resp:  [16-18] 16  (11/30 0545) BP: (98-103)/(52-62) 98/62 mmHg (11/30 0545) SpO2:  [93 %-94 %] 94 % (11/30 0545) Weight:  [202 lb 13.2 oz (92 kg)] 202 lb 13.2 oz (92 kg) (11/30 0545)  Intake/Output from previous day: 11/29 0701 - 11/30 0700 In: 960 [P.O.:960] Out: 300 [Urine:300] Intake/Output from this shift:       . amiodarone  200 mg Oral BID  . collagenase   Topical Daily  . cycloSPORINE  1 drop Both Eyes Q12H  . feeding supplement  1 Container Oral TID WC  . furosemide  40 mg Oral Daily  . metoprolol tartrate  25 mg Oral BID  . multivitamins ther. w/minerals  1 tablet Oral Daily  . nystatin  5 mL Mouth/Throat QID  . pantoprazole  40 mg Oral QAC breakfast  . potassium chloride  20 mEq Oral Daily  . povidone-iodine  1 application Topical BID  . traZODone  150 mg Oral QHS  . vitamin C  500 mg Oral Daily  . warfarin  2 mg Oral q1800  . DISCONTD: furosemide  40 mg Oral BID  . DISCONTD: potassium chloride  20 mEq Oral BID  . DISCONTD: traZODone  300 mg Oral QHS  . DISCONTD: warfarin  1 mg Oral q1800  . DISCONTD: warfarin  1 mg Oral q1800      Physical Exam: The patient appears to be in no distress.  Head and neck exam reveals that the pupils are equal and reactive.  The extraocular movements are full.  There is no scleral icterus.  Mouth and pharynx are benign.  No lymphadenopathy.  No carotid bruits.  The jugular venous pressure is normal.  Thyroid is not enlarged or tender.  Chest is clear to percussion and auscultation.  No rales or rhonchi.  Expansion of the chest is symmetrical.  Heart reveals no abnormal lift or heave.  First and second heart sounds are normal.  There is no  murmur gallop rub or click.  The abdomen is soft and nontender.  Bowel sounds are normoactive.  There is no hepatosplenomegaly or mass.  There are no abdominal bruits.  Extremities reveal moderate edema.  Pedal pulses are good.  There is no cyanosis or clubbing.  Neurologic exam is normal strength and no lateralizing weakness.  No sensory deficits.  Integument reveals no rash  Lab Results:  Basename 10/16/11 0622  WBC 13.3*  HGB 9.5*  PLT 58*   No results found for this basename: NA:2,K:2,CL:2,CO2:2,GLUCOSE:2,BUN:2,CREATININE:2 in the last 72 hours No results found for this basename: TROPONINI:2,CK,MB:2 in the last 72 hours Hepatic Function Panel No results found for this basename: PROT,ALBUMIN,AST,ALT,ALKPHOS,BILITOT,BILIDIR,IBILI in the last 72 hours No results found for this basename: CHOL in the last 72 hours No results found for this basename: PROTIME in the last 72 hours  Imaging: Imaging results have been reviewed  Cardiac Studies:  Assessment/Plan:  Patient Active Problem List  Diagnoses  . TINEA PEDIS  . ADENOCARCINOMA, PROSTATE  . HYPERCHOLESTEROLEMIA  . THROMBOCYTOPENIA  . DEPRESSION  . HYPERTENSION, BENIGN ESSENTIAL  . GERD  . DERMATITIS, ATOPIC  . CALLUS, LEFT FOOT  . OSTEOARTHRITIS, GENERALIZED, MULTIPLE JOINTS  .  BACK PAIN, CHRONIC  . MUSCLE SPASM, BACK  . PERSONAL HISTORY MALIGNANT NEOPLASM PROSTATE  . PERSONAL HISTORY OF MALIGNANT MELANOMA OF SKIN  . ARRHYTHMIA, HX OF  . PERSONAL HISTORY OF COLONIC POLYPS  . Cerumen impaction  . Hearing loss  . Valvular heart disease  . S/P mitral valve repair  . Atrial fibrillation  . Pneumothorax, postoperative  Plan:  Probable discharge to SNF today.  Followup with me in office after discharge from SNF    LOS: 16 days    Cassell Clement 10/17/2011, 7:46 AM

## 2011-10-17 NOTE — Progress Notes (Addendum)
CSW in communication with Wound Care Nurse to determine pt wound care needs, as SNF does not have ability to perform pulse lavage. WCN to speak with MD or PA and will follow-up.  Baxter Flattery, MSW [phone]   Per, MD, pt will stay at [facility] to receive hydrotherapy this weekend. CSW advised facility and spoke with pt. CSW in contact with pt mental health provider (Dr. Betti Cruz), to advise of new Rx for depression. CSW will communicate any new information to PA.   Baxter Flattery, MSW [phone]

## 2011-10-17 NOTE — Progress Notes (Signed)
Pt denies any pain. Assessment WNL. Chair alarm still activated. Denies any needs. Vitals stable. Will continue to monitor. Kalman Drape

## 2011-10-17 NOTE — Progress Notes (Signed)
Physical Therapy Wound Treatment Patient Details  Name: [name] [name] MRN: [id number] Date of Birth: May 02, 1931  Today's Date: 10/17/2011 Time1000:  - 1045     Subjective  Subjective: Pt states he fell this AM.  Pain Score:    Wound Assessment  Pressure Ulcer 10/06/11 Stage I -  Intact skin with non-blanchable redness of a localized area usually over a bony prominence. Baseball sized reddened area on both sides of buttocks, non blanchable, skin irritation. (Active)  State of Healing Non-healing 10/17/2011  9:31 AM  Site / Wound Assessment Red;Pink;Yellow 10/17/2011 10:00 AM  % Wound base Red or Granulating 75% 10/17/2011 10:00 AM  % Wound base Yellow 25% 10/17/2011 10:00 AM  Peri-wound Assessment Denuded 10/17/2011 10:00 AM  Wound Length (cm) 8 cm 10/16/2011 10:00 AM  Wound Width (cm) 4 cm 10/16/2011 10:00 AM  Wound Depth (cm) 0.1 cm 10/16/2011 10:00 AM  Drainage Amount Moderate 10/17/2011 10:00 AM  Drainage Description Serous 10/17/2011 10:00 AM  Treatment Hydrotherapy (Pulse lavage) 10/17/2011 10:00 AM  Dressing Type Moist to dry;Barrier Film (skin prep);Foam 10/17/2011 10:00 AM  Dressing Changed 10/17/2011 10:00 AM     Hydrotherapy Pulsed lavage therapy - wound location: lt. buttock Pulsed Lavage with Suction (psi):  (4-8 psi) Pulsed Lavage with Suction - Normal Saline Used: 1000 mL Pulsed Lavage Tip: Tip with splash shield   Wound Assessment and Plan  Wound Therapy - Assess/Plan/Recommendations Wound Therapy - Clinical Statement: Pt. with open area on lt. buttock with layer of yellow slough.  Have not noted any odor and only serous drainage during the two times I have seen pt.  Feel like much of the drainage is due to a weeping of the area around the open area.  Do not feel that pt needs hydrotherapy at the  SNF.  Would  just continue dressing changes there.  Wound Therapy Goals- Improve the function of patient's integumentary system by progressing the wound(s) through  the phases of wound healing (inflammation - proliferation - remodeling) by: Decrease Necrotic Tissue - Progress: Progressing toward goal Increase Granulation Tissue - Progress: Progressing toward goal Patient/Family Instruction Goal - Progress: Progressing toward goal  Goals will be updated until maximal potential achieved or discharge criteria met.  Discharge criteria: when goals achieved, discharge from hospital, MD decision/surgical intervention, no progress towards goals, refusal/missing three consecutive treatments without notification or medical reason.  Amyrah Pinkhasov 10/17/2011, 11:05 AM [name] PT [phone]

## 2011-10-17 NOTE — Progress Notes (Signed)
Pt denies any pain or changes. Assessment still consistent with previous assessment. Vitals stable. Will continue to monitor. Chair alarm still activated. Kalman Drape

## 2011-10-17 NOTE — Progress Notes (Addendum)
At 0530 IV team member found pt laying on the floor.  Upon entering the room the pt was laying flat on the floor.  Pt stated he "slid off the edge of the bed while using the urinal, landed on his right butt cheek and then turned his body so he could lay flat on his back and that when he slid he knocked over his water and spilt his urinal".  Pt denied any pain, dizziness, lightheadedness, or any new changes three times before he was erected to his feet and walked two feet to his chair.  Pt denied pain while I pressed on his right hip and buttocks. Vital signs stable 98/62, 96 NSR, 94%RA, 0/10 pain. Neurovascular check WNL, incision intact with no changes, and assessment unchanged from previous.  Informed MD oncall Dr. Laneta Simmers of the pt fall, no orders received. PT informed of fall protocol. Pt was not a fall risk but will now be listed as one. Yellow armband and red socks applied.  Chair alarm applied and activated.  Informed pt that he would need to call for help if he needed to get up, pt agreed. Will continue to monitor. Kalman Drape

## 2011-10-17 NOTE — Progress Notes (Signed)
TCTS BRIEF PROGRESS NOTE   PT's thoughts noted.  Ulcerations on buttocks have markedly improved over past 48 hours with hydrotherapy.  Will continue hydrotherapy as inpatient if it cannot be done as outpatient, then d/c to SNF when it is clear that the wounds are healing appropriately.  Khyra Viscuso H  6:15 PM

## 2011-10-17 NOTE — Progress Notes (Signed)
PT denies any pain or changes. Reenforced the need to call for assistance if standing or to use urinal. Will continue to monitor. Kalman Drape

## 2011-10-17 NOTE — Progress Notes (Signed)
CARDIAC REHAB PHASE I   PRE:  Rate/Rhythm: 80 SR  BP:  Supine: 93/52  Sitting:   Standing:    SaO2: 94 RA  MODE:  Ambulation: 300 ft   POST:  Rate/Rhythem: 93 SR  BP:  Supine:   Sitting: 122/66  Standing:    SaO2: 93 RA 1345-1420 Assisted X 2 and used walker to ambulate. Gait steady with walker. Pt  c/o of being dizzy and had to sit times two during walk. BP after walk stable. To recliner after walk with call light in reach and chair alarm on.  [name] [name]

## 2011-10-17 NOTE — Progress Notes (Signed)
Pt denies any pain or needs. Assessment unchanged. Dr. Cornelius Moras has been notified as well. Will continue to monitor. Kalman Drape

## 2011-10-17 NOTE — Progress Notes (Signed)
Ed completed with pt. Requests referral to [facility] for future when recuperated more.   Ethelda Chick CES, [place] [phone]

## 2011-10-17 NOTE — Progress Notes (Signed)
PT ambulated 242ft with walker on RA with standby assistance x1. Pt gait was briefly shaky at times. No pt complaints. Vitals stable. This was pt 3rd walk today. Pt assisted into bed, bed alarm activated, call bell and table within reach. Told to call for assistance. Will continue to monitor. Kalman Drape, RN

## 2011-10-18 LAB — CBC
HCT: 28.9 % — ABNORMAL LOW (ref 39.0–52.0)
Hemoglobin: 9.1 g/dL — ABNORMAL LOW (ref 13.0–17.0)
MCH: 28.5 pg (ref 26.0–34.0)
MCHC: 31.5 g/dL (ref 30.0–36.0)
MCV: 90.6 fL (ref 78.0–100.0)
RDW: 15.3 % (ref 11.5–15.5)

## 2011-10-18 MED ORDER — WARFARIN SODIUM 2.5 MG PO TABS
2.5000 mg | ORAL_TABLET | Freq: Every day | ORAL | Status: DC
  Administered 2011-10-18: 2.5 mg via ORAL
  Filled 2011-10-18 (×3): qty 1

## 2011-10-18 NOTE — Progress Notes (Addendum)
17 Days Post-Op Procedure(s) (LRB): MINIMALLY INVASIVE MITRAL VALVE REPAIR (MVR) (Right)  Subjective: Patient lying in chair without complaints. Awaiting hydrotherapy for ducubitus ulcers this am.  Objective: Vital signs in last 24 hours: Patient Vitals for the past 24 hrs:  BP Temp Temp src Pulse Resp SpO2 Weight  10/18/11 0440 93/56 mmHg 98.6 F (37 C) Oral 89  16  90 % 199 lb 15.3 oz (90.7 kg)  10/17/11 2008 105/66 mmHg 96.6 F (35.9 C) Oral 89  16  96 % -  10/17/11 1349 93/52 mmHg 97.7 F (36.5 C) Oral 80  18  94 % -  10/17/11 1330 108/66 mmHg - - 86  18  93 % -  10/17/11 1315 111/72 mmHg - - 79  18  92 % -  10/17/11 1300 119/62 mmHg - - 84  18  96 % -  10/17/11 1245 122/66 mmHg - - 82  18  94 % -  10/17/11 1059 138/91 mmHg - - - - - -   Pre op weight 91.6 kg Current Weight  10/18/11 199 lb 15.3 oz (90.7 kg)        Intake/Output from previous day: 11/30 0701 - 12/01 0700 In: 720 [P.O.:720] Out: 600 [Urine:600]   Physical Exam:  Cardiovascular: IRRR IRRR, no murmurs, gallops, or rubs. Pulmonary: Slightly decreased at bases R>L ; no rales, wheezes, or rhonchi. Abdomen: Soft, non tender, bowel sounds present. Extremities:No real lower extremity edema. Wounds: Clean and dry.  No erythema or signs of infection. Ecchymosis of right upper chest wall (has been decreasing over the last couple of days) Neurological:Grossly intact without focal deficits.  Lab Results: CBC:  Basename 10/18/11 0500 10/16/11 0622  WBC 11.1* 13.3*  HGB 9.1* 9.5*  HCT 28.9* 29.7*  PLT 61* 58*   BMET:  No results found for this basename: NA:2,K:2,CL:2,CO2:2,GLUCOSE:2,BUN:2,CREATININE:2,CALCIUM:2 in the last 72 hours  PT/INR:   Basename 10/18/11 0500  LABPROT 21.6*  INR 1.84*   ABG:  INR: Will add last result for INR, ABG once components are confirmed Will add last 4 CBG results once components are confirmed  Assessment/Plan:  1. CV - Previous Afib/flutter with CVR. SR with  first degree AV block  this am . Continue Amiodarone 200 bid.Also, on Lopressor 25 bid and SBP has been in the low 100's. Will give Coumadin 2.5 mg po this evening as INR slightly decreased to 1.84. 2. Pulmonary - Encourage incentive spirometer. 3. Volume Overload - Continue with daily diuresis for now. He is slightly below pre op weight.  4.  Acute blood loss anemia -H/H stable at 9.5/29.7. 5. Continue mycostatin for probable thrush. 6.Patient has an ulcer on buttock. Continue wound care.   7.Thrombocytopenia-Platelets slightly increased from 58 to 61,000. Patient with a history of this. 8.Continue CRPI/PT. 9.WBC continues to decrease-13.3 to 11.1.  Remains afebrile.  Possible source is ulcers on buttocks (although these are improving) as no wound infection. 11.Possible to SNF Monday if decubitus ulcers continue to heal well.    I have seen and examined the patient and agree with the assessment and plan as outlined.  Aeron Lheureux H 10/18/2011 1:16 PM

## 2011-10-18 NOTE — Progress Notes (Signed)
Physical Therapy Wound Treatment Patient Details  Name: [name] [name] MRN: [id number] Date of Birth: 1930-12-11  Today's Date: 10/18/2011 Time: 0820-0840 Time Calculation (min): 20 min  Subjective  Subjective: "Just my chest" when asked about pain. other wise no new complaints. Prior Treatments: pulsed lavage and dressing changes during acute stay  Pain Score: Pain Score:   3  Wound Assessment  Pressure Ulcer 10/06/11 Stage II -  Partial thickness loss of dermis presenting as a shallow open ulcer with a red, pink wound bed without slough. Baseball sized reddened area on both sides of buttocks, non blanchable, skin irritation. (Active)  State of Healing Early/partial granulation 10/18/2011 11:35 AM  Site / Wound Assessment Clean;Pink 10/18/2011 11:35 AM  % Wound base Red or Granulating 75% 10/18/2011 11:35 AM  % Wound base Yellow 25% 10/18/2011 11:35 AM  Peri-wound Assessment Denuded 10/18/2011 11:35 AM  Drainage Amount Moderate 10/18/2011 11:35 AM  Drainage Description Serous 10/18/2011 11:35 AM  Treatment Hydrotherapy (Pulse lavage) 10/18/2011 11:35 AM  Dressing Type Moist to dry;Barrier Film (skin prep);Foam 10/18/2011 11:35 AM  Dressing Changed 10/18/2011 11:35 AM   Hydrotherapy Pulsed lavage therapy - wound location: lt. buttock Pulsed Lavage with Suction (psi):  (4-8 psi) Pulsed Lavage with Suction - Normal Saline Used: 1000 mL Pulsed Lavage Tip: Tip with splash shield   Wound Assessment and Plan  Wound Therapy - Assess/Plan/Recommendations Wound Therapy - Clinical Statement: Wound is open with layer of yellow slough. Santyl applied to wound under wet-dry dressing for enzymatic debridement. Factors Delaying/Impairing Wound Healing: Multiple medical problems;Polypharmacy Hydrotherapy Plan: Patient/family education;Pulsatile lavage with suction;Dressing change;Debridement Wound Therapy - Follow Up Recommendations: Skilled nursing facility  Wound Therapy Goals- Improve the function  of patient's integumentary system by progressing the wound(s) through the phases of wound healing (inflammation - proliferation - remodeling) by: Decrease Necrotic Tissue - Progress: Progressing toward goal Increase Granulation Tissue - Progress: Progressing toward goal Patient/Family Instruction Goal - Progress: Progressing toward goal  Goals will be updated until maximal potential achieved or discharge criteria met.  Discharge criteria: when goals achieved, discharge from hospital, MD decision/surgical intervention, no progress towards goals, refusal/missing three consecutive treatments without notification or medical reason.  Sallyanne Kuster 10/18/2011, 11:48 AM  Sallyanne Kuster, PTA Office- [phone]

## 2011-10-18 NOTE — Progress Notes (Signed)
Physical Therapy Treatment Patient Details Name: [name] [name] MRN: [id number] DOB: Aug 04, 1931 Today's Date: 10/18/2011  PT Assessment/Plan  PT - Assessment/Plan Comments on Treatment Session: short mobility session due to getting pt ready for wound care session with hydrotherapy. no complaints with gait today. PT Plan: Discharge plan remains appropriate;Frequency remains appropriate PT Frequency: Min 3X/week Follow Up Recommendations: Skilled nursing facility;24 hour supervision/assistance Equipment Recommended: Defer to next venue PT Goals  Acute Rehab PT Goals PT Goal: Ambulate - Progress: Progressing toward goal PT Goal: Up/Down Stairs - Progress: Other (comment) (not addressed yet) PT Goal: Perform Home Exercise Program - Progress: Other (comment) (not addressed yet)  PT Treatment Precautions/Restrictions  Precautions Precautions: Fall;Sternal Required Braces or Orthoses: No Restrictions Weight Bearing Restrictions: No Mobility (including Balance) Bed Mobility Bed Mobility: Yes Rolling Right: 4: Min assist;With rail Rolling Right Details (indicate cue type and reason): cues for technique (bend knees and use legs to assist with turning trunk) and to decrease use of arms due to sternal precautions Rolling Left: 4: Min assist;With rail Rolling Left Details (indicate cue type and reason): cues for technique (bend knees and use legs to assist with turning trunk) and to decrease use of arms due to sternal precautions Sit to Supine - Left: 4: Min assist;HOB flat Sit to Supine - Left Details (indicate cue type and reason): no rails used (had pt hold heart pillow to chest to promote adhearance to sternal precautions). assist needed for lower extremities (to elevate them onto the bed). pt able to control trunk transistion onto bed with minimal upper ext use. Transfers Sit to Stand: From chair/3-in-1;Without upper extremity assist;3: Mod assist Sit to Stand Details (indicate cue type  and reason): max cues not to use arms (had pt hold heart pillow to chest) and to use rocking to gain momentum and promote ant wt shifting with standing from low surface of chair. Stand to Sit: 4: Min assist;With upper extremity assist;To bed Stand to Sit Details: min cues to back all the way to the bed surface and to use legs to lower self down slowly (pt holding pillow with sitting to promote adhearance to strernal precautions) Ambulation/Gait Ambulation/Gait Assistance: 4: Min assist Ambulation/Gait Assistance Details (indicate cue type and reason): cues to stay with RW with gait and with turning. short distance secondary to going to bed from chair for hydrotherapy treatment. Ambulation Distance (Feet): 10 Feet Assistive device: Rolling walker Gait Pattern: Step-through pattern;Decreased step length - right;Decreased step length - left;Trunk flexed Gait velocity: decreased cadence. Stairs: No  Posture/Postural Control Posture/Postural Control: No significant limitations Balance Balance Assessed: No Exercise    End of Session PT - End of Session Equipment Utilized During Treatment: Gait belt Activity Tolerance: Patient tolerated treatment well Patient left: in bed;Other (comment) (in bed on right side for hydrotherapy session) Nurse Communication: Mobility status for transfers;Mobility status for ambulation General Behavior During Session: [facility] for tasks performed Cognition: [facility] for tasks performed  Sallyanne Kuster 10/18/2011, 11:46 AM  Sallyanne Kuster, PTA Office- [phone]

## 2011-10-18 NOTE — Progress Notes (Signed)
CARDIAC REHAB PHASE I   PRE:  Rate/Rhythm: 83SR  BP:  Supine:   Sitting: 102/55  Standing:    SaO2: 95%ra  MODE:  Ambulation: 300 ft   POST:  Rate/Rhythem: 94SR  BP:  Supine:   Sitting: 111/61  Standing:    SaO2: 98%RA 1330-1400 Pt walked 300 ft on RA with rolling walker and asst x 2 with pt followed with chair. Had to sit down twice due to dizziness and weakness.  To recliner after walk with call bell. Daughter in room.  Duanne Limerick

## 2011-10-18 NOTE — Progress Notes (Signed)
Subjective:  Clinically is doing well at this time. The ulcerations on his buttocks is markedly improved. He denies angina and is not currently having any shortness of breath. Incidentally after care of his wife he died of significant pulmonary hypertension about 8 years ago.  Objective:  Vital Signs in the last 24 hours: BP 110/57  Pulse 93  Temp(Src) 98.6 F (37 C) (Oral)  Resp 16  Ht 6\' 5"  (1.956 m)  Wt 90.7 kg (199 lb 15.3 oz)  BMI 23.71 kg/m2  SpO2 90%  Physical Exam: Pleasant white male in no acute distress Lungs:  Clear to A&P Cardiac:  Regular rhythm, normal S1 and S2, no S3  Intake/Output from previous day: 11/30 0701 - 12/01 0700 In: 960 [P.O.:960] Out: 600 [Urine:600]  Lab Results:  CBC:  Basename 10/18/11 0500 10/16/11 0622  WBC 11.1* 13.3*  NEUTROABS -- --  HGB 9.1* 9.5*  HCT 28.9* 29.7*  MCV 90.6 91.7  PLT 61* 58*    Telemetry: Normal sinus rhythm  Assessment/Plan:  1. Previous mitral valve repair 2. Ulceration on buttocks improving with hydrotherapy  Recommendations:  Probably go to nursing home on Monday.     Darden Palmer.  MD [facility] 10/18/2011, 12:41 PM

## 2011-10-19 LAB — BASIC METABOLIC PANEL
BUN: 23 mg/dL (ref 6–23)
CO2: 32 mEq/L (ref 19–32)
Chloride: 99 mEq/L (ref 96–112)
Creatinine, Ser: 1.16 mg/dL (ref 0.50–1.35)
Glucose, Bld: 128 mg/dL — ABNORMAL HIGH (ref 70–99)

## 2011-10-19 MED ORDER — POTASSIUM CHLORIDE CRYS ER 20 MEQ PO TBCR
20.0000 meq | EXTENDED_RELEASE_TABLET | Freq: Every day | ORAL | Status: DC
  Administered 2011-10-20: 20 meq via ORAL
  Filled 2011-10-19 (×2): qty 1

## 2011-10-19 MED ORDER — FUROSEMIDE 40 MG PO TABS
40.0000 mg | ORAL_TABLET | Freq: Every day | ORAL | Status: DC
  Administered 2011-10-20 – 2011-10-21 (×2): 40 mg via ORAL
  Filled 2011-10-19 (×2): qty 1

## 2011-10-19 MED ORDER — WARFARIN SODIUM 5 MG PO TABS
5.0000 mg | ORAL_TABLET | Freq: Every day | ORAL | Status: DC
  Filled 2011-10-19: qty 1

## 2011-10-19 MED ORDER — POTASSIUM CHLORIDE 10 MEQ PO TBCR
40.0000 meq | EXTENDED_RELEASE_TABLET | Freq: Once | ORAL | Status: AC
  Administered 2011-10-19: 40 meq via ORAL
  Filled 2011-10-19: qty 4

## 2011-10-19 MED ORDER — POTASSIUM CHLORIDE CRYS ER 20 MEQ PO TBCR
40.0000 meq | EXTENDED_RELEASE_TABLET | Freq: Every day | ORAL | Status: DC
  Administered 2011-10-19 – 2011-10-21 (×3): 40 meq via ORAL
  Filled 2011-10-19 (×4): qty 2

## 2011-10-19 MED ORDER — WARFARIN SODIUM 2.5 MG PO TABS
2.5000 mg | ORAL_TABLET | Freq: Once | ORAL | Status: AC
  Administered 2011-10-19: 2.5 mg via ORAL
  Filled 2011-10-19: qty 1

## 2011-10-19 NOTE — Progress Notes (Addendum)
Pt's right groin oozing some purse and buttocks with anus has several lesions and also oozing. Very raw surrounding skin. PA Joycelyn Man notified and will Also Notify Dr. Cornelius Moras when doing rounds. Applied gauze dressing on it.

## 2011-10-19 NOTE — Progress Notes (Signed)
Patient ID: [name] [name], male   DOB: August 20, 1931, 77 y.o.   MRN: [id number] Patient is being followed by cardiology.  I have nothing to add today.

## 2011-10-19 NOTE — Progress Notes (Addendum)
18 Days Post-Op Procedure(s) (LRB): MINIMALLY INVASIVE MITRAL VALVE REPAIR (MVR) (Right)  Subjective:  Objective: Vital signs in last 24 hours: Patient Vitals for the past 24 hrs:  BP Temp Temp src Pulse Resp SpO2  10/19/11 0500 96/55 mmHg 97.8 F (36.6 C) Oral 82  18  90 %  10/18/11 2233 107/62 mmHg 98.5 F (36.9 C) Oral 91  18  91 %  10/18/11 1439 111/61 mmHg 98.4 F (36.9 C) Oral 78  18  95 %  10/18/11 1054 110/57 mmHg - - 93  - -   Pre op weight 91.6 kg Current Weight  10/18/11 199 lb 15.3 oz (90.7 kg)        Intake/Output from previous day: 12/01 0701 - 12/02 0700 In: 480 [P.O.:480] Out: 300 [Urine:300]   Physical Exam:  Cardiovascular: RRR, no murmurs, gallops, or rubs. Pulmonary: Slightly decreased at bases R>L ; no rales, wheezes, or rhonchi. Abdomen: Soft, non tender, bowel sounds present. Extremities:2+ ankle extremity edema. Wounds: Clean and dry.  No erythema or signs of infection. Ecchymosis of right upper chest wall (has been decreasing over the last couple of days) Neurological:Grossly intact without focal deficits.  Lab Results: CBC:  Basename 10/18/11 0500  WBC 11.1*  HGB 9.1*  HCT 28.9*  PLT 61*   BMET:   Basename 10/19/11 0630  NA 135  K 3.1*  CL 99  CO2 32  GLUCOSE 128*  BUN 23  CREATININE 1.16  CALCIUM 8.1*    PT/INR:   Basename 10/19/11 0630  LABPROT 21.6*  INR 1.84*   ABG:  INR: Will add last result for INR, ABG once components are confirmed Will add last 4 CBG results once components are confirmed  Assessment/Plan:  1. CV - Previous Afib/flutter with CVR. SR with first degree AV block  this am . Continue Amiodarone 200 bid.Also, on Lopressor 25 bid and SBP has been in the low 100's. Will give Coumadin 5 mg po this evening as INR remains 1.84. 2. Pulmonary - Encourage incentive spirometer. 3. Volume Overload - Has diuresed well and is below pre op weight. Will hold this am as SBP in the 90's, but will continue  thereafter. 4.  Acute blood loss anemia -H/H stable at 9.5/29.7. 5. Continue mycostatin for probable thrush. 6.Patient has ulcer on buttocks. Continue hydrotherapy as is improving. 7.Thrombocytopenia-Platelets slightly increased from 58 to 61,000. Patient with a history of this. 8.Continue CRPI/PT. 9.WBC continues to decrease-13.3 to 11.1.  Remains afebrile.  Possible source is ulcers on buttocks (although these are improving) as no wound infection. 11.Supplement Potassium. 12.Possible to SNF Monday if decubitus ulcers continue to heal well.    Ardelle Balls PA-C 10/19/2011 9:04 AM   I have seen and examined the patient and agree with the assessment and plan as outlined.  [name],[name] 10/19/2011 12:54 PM

## 2011-10-20 ENCOUNTER — Other Ambulatory Visit: Payer: Self-pay | Admitting: Thoracic Surgery (Cardiothoracic Vascular Surgery)

## 2011-10-20 DIAGNOSIS — I059 Rheumatic mitral valve disease, unspecified: Secondary | ICD-10-CM

## 2011-10-20 MED ORDER — WARFARIN SODIUM 2.5 MG PO TABS
2.5000 mg | ORAL_TABLET | Freq: Every day | ORAL | Status: DC
  Administered 2011-10-20 – 2011-10-23 (×4): 2.5 mg via ORAL
  Filled 2011-10-20 (×5): qty 1

## 2011-10-20 MED ORDER — CEPHALEXIN 500 MG PO CAPS
500.0000 mg | ORAL_CAPSULE | Freq: Two times a day (BID) | ORAL | Status: AC
  Administered 2011-10-20 – 2011-10-26 (×14): 500 mg via ORAL
  Filled 2011-10-20 (×14): qty 1

## 2011-10-20 MED ORDER — COLLAGENASE 250 UNIT/GM EX OINT
TOPICAL_OINTMENT | Freq: Every day | CUTANEOUS | Status: DC
  Administered 2011-10-20 – 2011-10-22 (×3): via TOPICAL
  Filled 2011-10-20: qty 30

## 2011-10-20 NOTE — Progress Notes (Signed)
CARDIAC REHAB PHASE I   PRE:  Rate/Rhythm: 77 Sr    BP: lying 90/60    SaO2: 91-92  RA  MODE:  Ambulation: 350 ft   POST:  Rate/Rhythm: 90    BP: sitting 120/72     SaO2: 95 RA  Pt tolerated fair. Tires easily. Had to sit x1. Then very fatigued toward end and began leaning over RW. To chair.  [id number]  [name] [name] CES, ACSM

## 2011-10-20 NOTE — Progress Notes (Signed)
Occupational Therapy Treatment Patient Details Name: [name] [name] MRN: [id number] DOB: 1931/02/25 Today's Date: 10/20/2011  OT Assessment/Plan OT Assessment/Plan OT Plan: Discharge plan remains appropriate OT Frequency: Min 1X/week Follow Up Recommendations: Skilled nursing facility Equipment Recommended: Defer to next venue OT Goals Acute Rehab OT Goals Time For Goal Achievement: 2 weeks ADL Goals ADL Goal: Grooming - Progress: Progressing toward goals ADL Goal: Lower Body Bathing - Progress: Not addressed ADL Goal: Lower Body Dressing - Progress: Progressing toward goals ADL Goal: Toilet Transfer - Progress: Progressing toward goals ADL Goal: Toileting - Hygiene - Progress: Progressing toward goals Additional ADL Goal #1: able to name approx. 2 with demonstration  OT Treatment Precautions/Restrictions  Precautions Precautions: Fall;Sternal Restrictions Weight Bearing Restrictions: No   ADL ADL Grooming: Performed;Wash/dry hands;Wash/dry face;Teeth care;Supervision/safety (min/guard assist) Grooming Details (indicate cue type and reason): cues for ue support on counter of sink, also for breathing tech. as pt. would hold his breath during activity Where Assessed - Grooming: Standing at sink Lower Body Dressing: Performed;Maximal assistance Lower Body Dressing Details (indicate cue type and reason): pt. with difficulty leaning forward to reach feet, but able to cross L/R and could reach a part of his foot but still required some assistance to pull sock over foot Where Assessed - Lower Body Dressing: Sitting, bed;Unsupported ADL Comments: pt. able to stand at sink greater than 5 min. for grooming tasks and to meet with dr. who came in during session. con't. cues for breathing tech. to prevent fatigue, also instructed and performed bed mobility Mobility  Bed Mobility Bed Mobility: Yes Rolling Right: 4: Min assist Rolling Left: 4: Min assist Sit to Supine - Left: 4: Min  assist;HOB flat Sit to Supine - Left Details (indicate cue type and reason): assistance with b lower extremities to get into bed, and to control trunk transition while rolling on right side Transfers Transfers: Yes Sit to Stand: 2: Max assist;From bed;From elevated surface Sit to Stand Details (indicate cue type and reason): max cues not to use arms to push up from bed Stand to Sit: 4: Min assist;With upper extremity assist;To bed Stand to Sit Details: con't. cues to maintain sternal precautions, also to back all the way up to the bed before sitting down Exercises    End of Session OT - End of Session Activity Tolerance: Patient tolerated treatment well Patient left: in bed;with call bell in reach General Behavior During Session: [facility] for tasks performed Cognition: [facility] for tasks performed  Robet Leu COTA/L 10/20/2011, 12:23 PM

## 2011-10-20 NOTE — Consult Note (Signed)
WOC follow-up Note:  Hydrotherapy has been ordered to assist with removal of nonviable tissue to un-stageable wound.  Wound has improved with this and Santyl to chemically debride nonviable tissue.  75% red, 25% yellow, remains moist with small yellow drainage. Skin tag which has been present for years intact to gluteal cleft.  Erythema to buttocks surrounding wound is decreasing.  Continue present plan of care to promote healing.   Cammie Mcgee, RN, MSN, [employer]  [phone]

## 2011-10-20 NOTE — Progress Notes (Signed)
   CARDIOTHORACIC SURGERY PROGRESS NOTE  19 Days Post-Op  S/P Procedure(s) (LRB): MINIMALLY INVASIVE MITRAL VALVE REPAIR (MVR) (Right)  Subjective: Slowly improving. Still very weak.  Objective: Vital signs in last 24 hours: Temp:  [97.6 F (36.4 C)-98.6 F (37 C)] 98.6 F (37 C) (12/03 0554) Pulse Rate:  [84-89] 84  (12/03 0554) Cardiac Rhythm:  [-] Heart block (12/02 2230) Resp:  [18] 18  (12/03 0554) BP: (95-109)/(57-66) 95/57 mmHg (12/03 0554) SpO2:  [91 %-94 %] 93 % (12/03 0554) Weight:  [92.4 kg (203 lb 11.3 oz)] 203 lb 11.3 oz (92.4 kg) (12/03 0554)  Physical Exam:  Rhythm:   sinus  Breath sounds: clear  Heart sounds:  RRR no murmur  Incisions:  Clean and dry except some serous drainage right groin  Abdomen:  soft  Extremities:  Warm, slightly improved LE edema  Buttocks:  Decubitus ulcerations stable, slightly improved   Intake/Output from previous day: 12/02 0701 - 12/03 0700 In: 480 [P.O.:480] Out: 500 [Urine:500] Intake/Output this shift:    Lab Results:  Basename 10/18/11 0500  WBC 11.1*  HGB 9.1*  HCT 28.9*  PLT 61*   BMET:  Basename 10/19/11 0630  NA 135  K 3.1*  CL 99  CO2 32  GLUCOSE 128*  BUN 23  CREATININE 1.16  CALCIUM 8.1*    CBG (last 3)  No results found for this basename: GLUCAP:3 in the last 72 hours PT/INR:   Basename 10/20/11 0500  LABPROT 23.5*  INR 2.05*    CXR:  N/A  Assessment/Plan: S/P Procedure(s) (LRB): MINIMALLY INVASIVE MITRAL VALVE REPAIR (MVR) (Right)  Overall stable. I am concerned about wound care and follow up for decubitus ulcerations. May also be developing right groin wound infection. Will send culture right groin and start empiric Keflex. Will look into alternatives for long term management of decubitus ulcerations.  [name] [name] H 10/20/2011

## 2011-10-20 NOTE — Progress Notes (Signed)
Physical Therapy Treatment Patient Details Name: [name] [name] MRN: [id number] DOB: 05-11-1931 Today's Date: 10/20/2011  PT Assessment/Plan  PT - Assessment/Plan Comments on Treatment Session: The patient is progressing well with gait and mobility.  He still needs cues for safety with sternal precautions.  Able to get up easily from very elevated surface, likely much more difficult from low chair.   PT Plan: Discharge plan remains appropriate;Frequency remains appropriate PT Frequency: Min 3X/week Follow Up Recommendations: Skilled nursing facility;24 hour supervision/assistance Equipment Recommended: Defer to next venue PT Goals  Acute Rehab PT Goals Pt will go Sit to Stand: with min assist;Other (comment) (elevated surface, without upper extremity assist) PT Goal: Sit to Stand - Progress: Progressing toward goal Pt will go Stand to Sit: with min assist (elevated surface without upper extremity assist.  ) PT Goal: Stand to Sit - Progress: Progressing toward goal PT Goal: Ambulate - Progress: Progressing toward goal PT Goal: Perform Home Exercise Program - Progress: Progressing toward goal  PT Treatment Precautions/Restrictions  Precautions Precautions: Sternal Precaution Comments: reinforced pillow use and reinforced sternal precautions.   Required Braces or Orthoses: No Restrictions Weight Bearing Restrictions: No Mobility (including Balance) Bed Mobility Sit to Supine - Left: 4: Min assist Sit to Supine - Left Details (indicate cue type and reason): min assist of bil legs.  Patient hugging pillow to try to avoid using arms to pull on the bed.   Scooting to [facility]: 4: Min assist Scooting to [facility] Details (indicate cue type and reason): min assist to stabilize feet so patient could use bridge technique to scoot to [facility].   Transfers Sit to Stand: 4: Min assist;From elevated surface;From bed Sit to Stand Details (indicate cue type and reason): min assist to boost patient up to feet  from sitting.  Verbal cues to use legs not arms.   Stand to Sit: 5: Supervision;To elevated surface;To bed Stand to Sit Details: supervision for safety to high bed.  Again, verbal cues to lower with legs and not arms.   Ambulation/Gait Ambulation/Gait Assistance: 4: Min assist Ambulation/Gait Assistance Details (indicate cue type and reason): min assist to steady patient's trunk for balance while turning.  Verbal cues for fully upright posture.   Ambulation Distance (Feet): 120 Feet Assistive device: Rolling walker Gait Pattern: Shuffle;Trunk flexed    Exercise  General Exercises - Lower Extremity Ankle Circles/Pumps: AROM;Both;10 reps;Other (comment) (to encourage decreased edema in bil legs.  ) End of Session PT - End of Session Equipment Utilized During Treatment:  (RW) Activity Tolerance: Patient limited by fatigue Patient left: in bed;with call bell in reach;with family/visitor present (daughter) General Behavior During Session: [facility] for tasks performed Cognition: [facility] for tasks performed  Madailein Londo B. Kaidon Kinker, PT, DPT [phone]   10/20/2011, 6:06 PM

## 2011-10-20 NOTE — Progress Notes (Signed)
Subjective:  No chest pain or dyspnea. Mouth feels better.  Objective:  Vital Signs in the last 24 hours: Temp:  [97.6 F (36.4 C)-98.6 F (37 C)] 98.6 F (37 C) (12/03 0554) Pulse Rate:  [84-89] 84  (12/03 0554) Resp:  [18] 18  (12/03 0554) BP: (95-109)/(57-66) 95/57 mmHg (12/03 0554) SpO2:  [91 %-94 %] 93 % (12/03 0554) Weight:  [203 lb 11.3 oz (92.4 kg)] 203 lb 11.3 oz (92.4 kg) (12/03 0554)  Intake/Output from previous day: 12/02 0701 - 12/03 0700 In: 480 [P.O.:480] Out: 500 [Urine:500] Intake/Output from this shift:       . amiodarone  200 mg Oral BID  . collagenase   Topical Daily  . cycloSPORINE  1 drop Both Eyes Q12H  . feeding supplement  1 Container Oral TID WC  . furosemide  40 mg Oral Daily  . metoprolol tartrate  25 mg Oral BID  . multivitamins ther. w/minerals  1 tablet Oral Daily  . nystatin  5 mL Mouth/Throat QID  . pantoprazole  40 mg Oral QAC breakfast  . potassium chloride  40 mEq Oral Once  . potassium chloride  20 mEq Oral Daily  . potassium chloride  40 mEq Oral Daily  . povidone-iodine  1 application Topical BID  . traZODone  150 mg Oral QHS  . vitamin C  500 mg Oral Daily  . warfarin  2.5 mg Oral ONCE-1800  . DISCONTD: furosemide  40 mg Oral Daily  . DISCONTD: potassium chloride  20 mEq Oral Daily  . DISCONTD: warfarin  2.5 mg Oral q1800  . DISCONTD: warfarin  5 mg Oral q1800      Physical Exam: The patient appears to be in no distress.  Head and neck exam reveals that the pupils are equal and reactive.  The extraocular movements are full.  There is no scleral icterus.  Mouth and pharynx are benign.  No lymphadenopathy.  No carotid bruits.  The jugular venous pressure is normal.  Thyroid is not enlarged or tender.  Chest is clear to percussion and auscultation.  No rales or rhonchi.  Expansion of the chest is symmetrical.  Heart reveals no abnormal lift or heave.  First and second heart sounds are normal.  There is no murmur gallop rub or  click.  The abdomen is soft and nontender.  Bowel sounds are normoactive.  There is no hepatosplenomegaly or mass.  There are no abdominal bruits.  Extremities reveal moderate edema.  Neurologic exam is normal strength and no lateralizing weakness.  No sensory deficits.  Integument reveals no rash  Lab Results:  Basename 10/18/11 0500  WBC 11.1*  HGB 9.1*  PLT 61*    Basename 10/19/11 0630  NA 135  K 3.1*  CL 99  CO2 32  GLUCOSE 128*  BUN 23  CREATININE 1.16   No results found for this basename: TROPONINI:2,CK,MB:2 in the last 72 hours Hepatic Function Panel No results found for this basename: PROT,ALBUMIN,AST,ALT,ALKPHOS,BILITOT,BILIDIR,IBILI in the last 72 hours No results found for this basename: CHOL in the last 72 hours No results found for this basename: PROTIME in the last 72 hours  Imaging: Imaging results have been reviewed  Cardiac Studies: TElemetry shows NSR Assessment/Plan:  Patient Active Problem List  Diagnoses  . Mitral valve repair.      Doing well. Post-op atrial fib      Maintaining NSR Thrush      We can DC Mycostatin when he goes to SNF.    .   .   .   .   .   .   .   .   .   .   .   .   .   .   .   .   .   .   .   .   Marland Kitchen  LOS: 19 days    Cassell Clement 10/20/2011, 7:53 AM

## 2011-10-20 NOTE — Progress Notes (Signed)
Physical Therapy Wound Treatment Patient Details  Name: EUSTACIO ELLEN MRN: [id number] Date of Birth: 07-Jun-1931  Today's Date: 10/20/2011 Time:0920  - [id number]    Subjective  Subjective: "It just feels kind of numb,"  pt states about wound area.  Pain Score:    Wound Assessment  Pressure Ulcer 10/06/11 Stage II -  Partial thickness loss of dermis presenting as a shallow open ulcer with a red, pink wound bed without slough. Baseball sized reddened area on both sides of buttocks, non blanchable, skin irritation. (Active)  State of Healing Early/partial granulation 10/20/2011  9:00 AM  Site / Wound Assessment Pink;Red 10/20/2011  9:00 AM  % Wound base Red or Granulating 75% 10/20/2011  9:00 AM  % Wound base Yellow 25% 10/20/2011  9:00 AM  Peri-wound Assessment Denuded 10/19/2011  7:47 AM  Wound Length (cm) 8 cm 10/16/2011 10:00 AM  Wound Width (cm) 4 cm 10/16/2011 10:00 AM  Wound Depth (cm) 0.1 cm 10/16/2011 10:00 AM  Drainage Amount Moderate 10/19/2011  7:47 AM  Drainage Description Serous 10/20/2011  9:00 AM  Treatment Hydrotherapy (Pulse lavage) 10/20/2011  9:00 AM  Dressing Type Moist to dry;Barrier Film (skin prep) 10/20/2011  9:00 AM  Dressing Changed 10/20/2011  9:00 AM     Hydrotherapy Pulsed lavage therapy - wound location: lt. buttock Pulsed Lavage with Suction (psi):  (4-8 psi) Pulsed Lavage with Suction - Normal Saline Used: 1000 mL Pulsed Lavage Tip: Tip with splash shield   Wound Assessment and Plan  Wound Therapy - Assess/Plan/Recommendations Wound Therapy - Clinical Statement: Wound remains stable. Wound Therapy - Follow Up Recommendations: Skilled nursing facility  Wound Therapy Goals- Improve the function of patient's integumentary system by progressing the wound(s) through the phases of wound healing (inflammation - proliferation - remodeling) by: Decrease Necrotic Tissue - Progress: Progressing toward goal Increase Granulation Tissue - Progress: Progressing toward  goal Patient/Family Instruction Goal - Progress: Progressing toward goal  Goals will be updated until maximal potential achieved or discharge criteria met.  Discharge criteria: when goals achieved, discharge from hospital, MD decision/surgical intervention, no progress towards goals, refusal/missing three consecutive treatments without notification or medical reason.  Ismael Treptow 10/20/2011, 9:39 AM [facility] PT [phone]

## 2011-10-21 LAB — PROTIME-INR: Prothrombin Time: 23.3 seconds — ABNORMAL HIGH (ref 11.6–15.2)

## 2011-10-21 MED ORDER — FUROSEMIDE 40 MG PO TABS
40.0000 mg | ORAL_TABLET | Freq: Two times a day (BID) | ORAL | Status: DC
  Administered 2011-10-21 – 2011-10-23 (×4): 40 mg via ORAL
  Filled 2011-10-21 (×6): qty 1

## 2011-10-21 MED ORDER — POTASSIUM CHLORIDE CRYS ER 20 MEQ PO TBCR
40.0000 meq | EXTENDED_RELEASE_TABLET | Freq: Two times a day (BID) | ORAL | Status: DC
  Administered 2011-10-21 – 2011-10-26 (×11): 40 meq via ORAL
  Filled 2011-10-21: qty 2
  Filled 2011-10-21: qty 1
  Filled 2011-10-21 (×4): qty 2
  Filled 2011-10-21: qty 1
  Filled 2011-10-21 (×7): qty 2

## 2011-10-21 NOTE — Progress Notes (Signed)
CSW coordinated daily wound care treatment at SNF and follow up at [facility], per Dr Cornelius Moras. Pt to d/c tomorrow [name], barring any other medical complications. CSW will continue to follow.   Baxter Flattery, MSW [phone]

## 2011-10-21 NOTE — Progress Notes (Addendum)
20 Days Post-Op  Procedure(s) (LRB): MINIMALLY INVASIVE MITRAL VALVE REPAIR (MVR) (Right) Subjective: Feels fair  Objective  Telemetry NSR  Temp:  [97.8 F (36.6 C)] 97.8 F (36.6 C) (12/04 0530) Pulse Rate:  [83-84] 84  (12/04 0530) Resp:  [19-20] 20  (12/04 0530) BP: (101-112)/(59-62) 102/62 mmHg (12/04 0530) SpO2:  [90 %-94 %] 90 % (12/04 0530)   Intake/Output Summary (Last 24 hours) at 10/21/11 0800 Last data filed at 10/21/11 0641  Gross per 24 hour  Intake    490 ml  Output   1425 ml  Net   -935 ml       General appearance: alert, cooperative, fatigued and no distress Heart: regular rate and rhythm and S1, S2 normal Lungs: clear to auscultation bilaterally Abdomen: soft, non-tender; bowel sounds normal; no masses,  no organomegaly Extremities: Bil LE edema1-2+ Wound: some purulence r groin/ decub not examined at this time  Lab Results:  Basename 10/19/11 0630  NA 135  K 3.1*  CL 99  CO2 32  GLUCOSE 128*  BUN 23  CREATININE 1.16  CALCIUM 8.1*  MG --  PHOS --   No results found for this basename: AST:2,ALT:2,ALKPHOS:2,BILITOT:2,PROT:2,ALBUMIN:2 in the last 72 hours No results found for this basename: LIPASE:2,AMYLASE:2 in the last 72 hours No results found for this basename: WBC:2,NEUTROABS:2,HGB:2,HCT:2,MCV:2,PLT:2 in the last 72 hours No results found for this basename: CKTOTAL:4,CKMB:4,TROPONINI:4 in the last 72 hours No results found for this basename: POCBNP:3 in the last 72 hours No results found for this basename: DDIMER in the last 72 hours No results found for this basename: HGBA1C in the last 72 hours No results found for this basename: CHOL,HDL,LDLCALC,TRIG,CHOLHDL in the last 72 hours No results found for this basename: TSH,T4TOTAL,FREET3,T3FREE,THYROIDAB in the last 72 hours No results found for this basename: VITAMINB12,FOLATE,FERRITIN,TIBC,IRON,RETICCTPCT in the last 72 hours  Medications: Scheduled    . amiodarone  200 mg Oral BID    . cephALEXin  500 mg Oral Q12H  . collagenase   Topical Daily  . cycloSPORINE  1 drop Both Eyes Q12H  . feeding supplement  1 Container Oral TID WC  . furosemide  40 mg Oral Daily  . metoprolol tartrate  25 mg Oral BID  . multivitamins ther. w/minerals  1 tablet Oral Daily  . nystatin  5 mL Mouth/Throat QID  . pantoprazole  40 mg Oral QAC breakfast  . potassium chloride  20 mEq Oral Daily  . potassium chloride  40 mEq Oral Daily  . povidone-iodine  1 application Topical BID  . traZODone  150 mg Oral QHS  . vitamin C  500 mg Oral Daily  . warfarin  2.5 mg Oral q1800  . DISCONTD: collagenase   Topical Daily     Radiology/Studies:  No results found.  INR: Will add last result for INR, ABG once components are confirmed Will add last 4 CBG results once components are confirmed  Assessment/Plan: S/P Procedure(s) (LRB): MINIMALLY INVASIVE MITRAL VALVE REPAIR (MVR) (Right) 1. Stable , cont's wound care, will order air overlay mattress 2. Wbc 11.1 , afebrile, on keflex 3. INR 1.84 on coumadin 2.5 mg 4. conts current medical rx   LOS: 20 days    GOLD,WAYNE E 12/4/20128:00 AM   I have seen and examined the patient and agree with the assessment and plan as outlined.  Will make arrangements for weekly visits to [facility] for follow up of buttocks decubitus ulcerations.  Tentatively for d/c to SNF tomorrow.  Will increase lasix and KCl to bid.  Carleah Yablonski H 10/21/2011 5:09 PM

## 2011-10-21 NOTE — Progress Notes (Signed)
CSW continues to follow to coordinate d/c to SNF when pt is medically ready.  Baxter Flattery, MSW [phone]

## 2011-10-21 NOTE — Progress Notes (Signed)
CARDIAC REHAB PHASE I   PRE:  Rate/Rhythm: 88 SR    BP: lying 92/52, sitting 92/52, BSC 100/60    SaO2: 88-92 RA  MODE:  Ambulation: 250 ft   POST:  Rate/Rhythm: 103    BP: sitting 116/70     SaO2: 88-90 RA  Pt feeling more SOB today. SaO2 borderline. DOE walking, sat x1 for rest. Used RW assist x2. Exhausted after walk. To recliner. [phone]  Harriet Masson CES, ACSM

## 2011-10-21 NOTE — Progress Notes (Signed)
Subjective:  No chest pain .   Still somewhat dyspneic with walking but better.  Rhythm remains NSR Mouth feels better.  Objective:  Vital Signs in the last 24 hours: Temp:  [97.8 F (36.6 C)] 97.8 F (36.6 C) (12/04 0530) Pulse Rate:  [83-84] 84  (12/04 0530) Resp:  [19-20] 20  (12/04 0530) BP: (101-102)/(59-62) 102/62 mmHg (12/04 0530) SpO2:  [90 %-94 %] 90 % (12/04 0530)  Intake/Output from previous day: 12/03 0701 - 12/04 0700 In: 490 [P.O.:490] Out: 1425 [Urine:1425] Intake/Output from this shift:       . amiodarone  200 mg Oral BID  . cephALEXin  500 mg Oral Q12H  . collagenase   Topical Daily  . cycloSPORINE  1 drop Both Eyes Q12H  . feeding supplement  1 Container Oral TID WC  . furosemide  40 mg Oral Daily  . metoprolol tartrate  25 mg Oral BID  . multivitamins ther. w/minerals  1 tablet Oral Daily  . nystatin  5 mL Mouth/Throat QID  . pantoprazole  40 mg Oral QAC breakfast  . potassium chloride  20 mEq Oral Daily  . potassium chloride  40 mEq Oral Daily  . povidone-iodine  1 application Topical BID  . traZODone  150 mg Oral QHS  . vitamin C  500 mg Oral Daily  . warfarin  2.5 mg Oral q1800      Physical Exam: The patient appears to be in no distress.  Head and neck exam reveals that the pupils are equal and reactive.  The extraocular movements are full.  There is no scleral icterus.  Mouth and pharynx are benign.  No lymphadenopathy.  No carotid bruits.  The jugular venous pressure is normal.  Thyroid is not enlarged or tender.  Chest is clear to percussion and auscultation.  No rales or rhonchi.  Expansion of the chest is symmetrical.  Heart reveals no abnormal lift or heave.  First and second heart sounds are normal.  There is no murmur gallop rub or click.  The abdomen is soft and nontender.  Bowel sounds are normoactive.  There is no hepatosplenomegaly or mass.  There are no abdominal bruits.  Extremities reveal moderate edema.  Neurologic exam is  normal strength and no lateralizing weakness.  No sensory deficits.  Integument sacral area not examined.  Lab Results: No results found for this basename: WBC:2,HGB:2,PLT:2 in the last 72 hours  Basename 10/19/11 0630  NA 135  K 3.1*  CL 99  CO2 32  GLUCOSE 128*  BUN 23  CREATININE 1.16   No results found for this basename: TROPONINI:2,CK,MB:2 in the last 72 hours Hepatic Function Panel No results found for this basename: PROT,ALBUMIN,AST,ALT,ALKPHOS,BILITOT,BILIDIR,IBILI in the last 72 hours No results found for this basename: CHOL in the last 72 hours No results found for this basename: PROTIME in the last 72 hours  Imaging: Imaging results have been reviewed  Cardiac Studies: TElemetry shows NSR Assessment/Plan:  Patient Active Problem List  Diagnoses  . Mitral valve repair.      Doing well. Post-op atrial fib      Maintaining NSR Thrush      We can DC Mycostatin when he goes to SNF.    .   .   .   .   .   .   .   .   .   .   .   .   .   .   .   .   .   .   .   .   Marland Kitchen  LOS: 20 days    Cassell Clement 10/21/2011, 10:45 AM

## 2011-10-21 NOTE — Progress Notes (Signed)
10-21-2011 20:00    Pt. Amb. In hall approx. 200 ft. Had to stop 5 times, doe resp 28. Lungs clr. o2 sats 93-94% r.n. Present with amb. Assist back to bed pt. Resting comf.

## 2011-10-21 NOTE — Progress Notes (Signed)
Physical Therapy Wound Treatment Patient Details  Name: [name] [name] MRN: [id number] Date of Birth: Jul 05, 1931  Today's Date: 10/21/2011 [id number]  - 0914    Subjective  Subjective: "How does it look?"  pt asked about wound.  Pain Score:    Wound Assessment  Pressure Ulcer 10/06/11 Stage II -  Partial thickness loss of dermis presenting as a shallow open ulcer with a red, pink wound bed without slough. Baseball sized reddened area on both sides of buttocks, non blanchable, skin irritation. (Active)  State of Healing Early/partial granulation 10/21/2011  9:00 AM  Site / Wound Assessment Pink;Red;Yellow 10/21/2011  9:00 AM  % Wound base Red or Granulating 50% 10/21/2011  9:00 AM  % Wound base Yellow 50% 10/21/2011  9:00 AM  Peri-wound Assessment Denuded 10/19/2011  7:47 AM  Wound Length (cm) 8 cm 10/16/2011 10:00 AM  Wound Width (cm) 4 cm 10/16/2011 10:00 AM  Wound Depth (cm) 0.1 cm 10/16/2011 10:00 AM  Drainage Amount Moderate 10/21/2011  9:00 AM  Drainage Description Green 10/21/2011  9:00 AM  Treatment Hydrotherapy (Pulse lavage) 10/21/2011  9:00 AM  Dressing Type Moist to dry;Barrier Film (skin prep) 10/21/2011  9:00 AM  Dressing Changed 10/21/2011  9:00 AM     Hydrotherapy Pulsed lavage therapy - wound location: lt. buttock Pulsed Lavage with Suction (psi):  (4-8 psi) Pulsed Lavage with Suction - Normal Saline Used: 1000 mL Pulsed Lavage Tip: Tip with splash shield     Pt with open area in between buttocks 4cm x 2cm and 50%red, 50% yellow slough. Wound Assessment and Plan  Wound Therapy - Assess/Plan/Recommendations Wound Therapy - Clinical Statement: Pt has developed blue, green drainage of wound indicative of pseudomonas. Wound Therapy - Frequency: 6X / week Wound Therapy - Follow Up Recommendations: Skilled nursing facility  Wound Therapy Goals- Improve the function of patient's integumentary system by progressing the wound(s) through the phases of wound healing (inflammation  - proliferation - remodeling) by: Decrease Necrotic Tissue - Progress: Not met Increase Granulation Tissue - Progress: Not met Patient/Family Instruction Goal - Progress: Progressing toward goal  Goals will be updated until maximal potential achieved or discharge criteria met.  Discharge criteria: when goals achieved, discharge from hospital, MD decision/surgical intervention, no progress towards goals, refusal/missing three consecutive treatments without notification or medical reason.  Januel Doolan 10/21/2011, 9:21 AM [name] PT [phone]

## 2011-10-22 ENCOUNTER — Inpatient Hospital Stay (HOSPITAL_COMMUNITY): Payer: Medicare Other

## 2011-10-22 LAB — BASIC METABOLIC PANEL
BUN: 23 mg/dL (ref 6–23)
CO2: 28 mEq/L (ref 19–32)
Chloride: 103 mEq/L (ref 96–112)
Creatinine, Ser: 1.13 mg/dL (ref 0.50–1.35)

## 2011-10-22 LAB — PRO B NATRIURETIC PEPTIDE: Pro B Natriuretic peptide (BNP): 3876 pg/mL — ABNORMAL HIGH (ref 0–450)

## 2011-10-22 NOTE — Progress Notes (Signed)
Subjective:  Still dyspneic when ambulating. Marked increase in weight since yesterday ? Accurate?  Objective:  Vital Signs in the last 24 hours: Temp:  [97.8 F (36.6 C)-98.2 F (36.8 C)] 98.2 F (36.8 C) (12/05 0347) Pulse Rate:  [84-92] 84  (12/05 0347) Resp:  [20] 20  (12/05 0347) BP: (98-107)/(61-68) 98/61 mmHg (12/05 0347) SpO2:  [90 %-94 %] 90 % (12/05 0347) Weight:  [215 lb 2.7 oz (97.6 kg)] 215 lb 2.7 oz (97.6 kg) (12/05 0347)  Intake/Output from previous day: 12/04 0701 - 12/05 0700 In: 960 [P.O.:960] Out: 1201 [Urine:1200; Stool:1] Intake/Output from this shift:       . amiodarone  200 mg Oral BID  . cephALEXin  500 mg Oral Q12H  . collagenase   Topical Daily  . cycloSPORINE  1 drop Both Eyes Q12H  . feeding supplement  1 Container Oral TID WC  . furosemide  40 mg Oral BID  . metoprolol tartrate  25 mg Oral BID  . multivitamins ther. w/minerals  1 tablet Oral Daily  . nystatin  5 mL Mouth/Throat QID  . pantoprazole  40 mg Oral QAC breakfast  . potassium chloride  40 mEq Oral BID  . povidone-iodine  1 application Topical BID  . traZODone  150 mg Oral QHS  . vitamin C  500 mg Oral Daily  . warfarin  2.5 mg Oral q1800  . DISCONTD: furosemide  40 mg Oral Daily  . DISCONTD: potassium chloride  20 mEq Oral Daily  . DISCONTD: potassium chloride  40 mEq Oral Daily      Physical Exam: The patient appears to be in no distress.  Head and neck exam reveals that the pupils are equal and reactive.  The extraocular movements are full.  There is no scleral icterus.  Mouth and pharynx are benign.  No lymphadenopathy.  No carotid bruits.  The jugular venous pressure is normal.  Thyroid is not enlarged or tender.  Chest is clear to percussion and auscultation.  Mild basilar rales, no rhonchi.  Expansion of the chest is symmetrical.  Heart reveals no abnormal lift or heave.  First and second heart sounds are normal.  There is no murmur gallop rub or click.  The abdomen  is soft and nontender.  Bowel sounds are normoactive.  There is no hepatosplenomegaly or mass.  There are no abdominal bruits.  Extremities reveal moderate edema.   There is no cyanosis or clubbing.  Neurologic exam is normal strength and no lateralizing weakness.  No sensory deficits.  Integument reveals no rash  Lab Results: No results found for this basename: WBC:2,HGB:2,PLT:2 in the last 72 hours No results found for this basename: NA:2,K:2,CL:2,CO2:2,GLUCOSE:2,BUN:2,CREATININE:2 in the last 72 hours No results found for this basename: TROPONINI:2,CK,MB:2 in the last 72 hours Hepatic Function Panel No results found for this basename: PROT,ALBUMIN,AST,ALT,ALKPHOS,BILITOT,BILIDIR,IBILI in the last 72 hours No results found for this basename: CHOL in the last 72 hours No results found for this basename: PROTIME in the last 72 hours  Imaging: Imaging results have been reviewed.  No recent xray.  Cardiac Studies:  Assessment/Plan:  Patient Active Problem List  Diagnoses  . TINEA PEDIS  . ADENOCARCINOMA, PROSTATE  . HYPERCHOLESTEROLEMIA  . THROMBOCYTOPENIA  . DEPRESSION  . HYPERTENSION, BENIGN ESSENTIAL  . GERD  . DERMATITIS, ATOPIC  . CALLUS, LEFT FOOT  . OSTEOARTHRITIS, GENERALIZED, MULTIPLE JOINTS  . BACK PAIN, CHRONIC  . MUSCLE SPASM, BACK  . PERSONAL HISTORY MALIGNANT NEOPLASM PROSTATE  . PERSONAL  HISTORY OF MALIGNANT MELANOMA OF SKIN  . ARRHYTHMIA, HX OF  . PERSONAL HISTORY OF COLONIC POLYPS  . Cerumen impaction  . Hearing loss  . Valvular heart disease  . S/P mitral valve repair  . Atrial fibrillation  . Pneumothorax, postoperative Dyspnea and weight gain      Now on Lasix 40 BID    LOS: 21 days    [name] [name] 10/22/2011, 8:09 AM

## 2011-10-22 NOTE — Progress Notes (Signed)
Physical Therapy Wound Treatment Patient Details  Name: [name] [name] MRN: [id number] Date of Birth: 1931/04/13  Today's Date: 10/22/2011 Time: 4098-1191 Time Calculation (min): 21 min  Subjective  Subjective: "how does it look?" pt asked about wound  Pain Score:    Wound Assessment  Pressure Ulcer 10/06/11 Stage II -  Partial thickness loss of dermis presenting as a shallow open ulcer with a red, pink wound bed without slough. Baseball sized reddened area on both sides of buttocks, non blanchable, skin irritation. (Active)  State of Healing Early/partial granulation 10/22/2011  9:00 AM  Site / Wound Assessment Pink;Red;Yellow 10/22/2011  9:00 AM  % Wound base Red or Granulating 50% 10/22/2011  9:00 AM  % Wound base Yellow 50% 10/22/2011  9:00 AM  Peri-wound Assessment Induration 10/22/2011  9:00 AM  Wound Length (cm) 8 cm 10/16/2011 10:00 AM  Wound Width (cm) 4 cm 10/16/2011 10:00 AM  Wound Depth (cm) 0.1 cm 10/16/2011 10:00 AM  Drainage Amount Moderate 10/22/2011  9:00 AM  Drainage Description Green 10/22/2011  9:00 AM  Treatment Hydrotherapy (Pulse lavage) 10/22/2011  9:00 AM  Dressing Type Moist to dry;Barrier Film (skin prep);ABD 10/22/2011  9:00 AM  Dressing Changed 10/22/2011  9:00 AM     Hydrotherapy Pulsed lavage therapy - wound location: lt. buttock Pulsed Lavage with Suction (psi):  (4-8 psi) Pulsed Lavage with Suction - Normal Saline Used: 1000 mL Pulsed Lavage Tip: Tip with splash shield   Wound Assessment and Plan  Wound Therapy - Assess/Plan/Recommendations Wound Therapy - Clinical Statement: Pt continues to have blue green drainage from buttock wound.  Area around wound more indurated (?due to incr. fluid retention) Wound Therapy - Follow Up Recommendations: Skilled nursing facility  Wound Therapy Goals- Improve the function of patient's integumentary system by progressing the wound(s) through the phases of wound healing (inflammation - proliferation - remodeling)  by: Decrease Necrotic Tissue - Progress: Progressing toward goal Increase Granulation Tissue - Progress: Progressing toward goal Patient/Family Instruction Goal - Progress: Progressing toward goal  Goals will be updated until maximal potential achieved or discharge criteria met.  Discharge criteria: when goals achieved, discharge from hospital, MD decision/surgical intervention, no progress towards goals, refusal/missing three consecutive treatments without notification or medical reason.  Santia Labate 10/22/2011, 9:22 AM [facility] PT [phone]

## 2011-10-22 NOTE — Progress Notes (Signed)
Discussed pt in LOS meeting this morning.  CSW spoke with pharmacy and with Gershon Crane regarding possibly pseudomonas infection - SNF requesting antibiotic regimen be established before pt is d/c'd. PA to order wound culture.  [facility]. Pt has appointment 12/13 at 9:45. CSW has advised SNF.  Note pt SOB today and not ready to d/c. CSW will continue to follow.  Baxter Flattery, MSW [phone]

## 2011-10-22 NOTE — Progress Notes (Signed)
UR Completed.  Early Steel Jane [phone] 10/22/2011  

## 2011-10-22 NOTE — Progress Notes (Addendum)
21 Days Post-Op  Procedure(s) (LRB): MINIMALLY INVASIVE MITRAL VALVE REPAIR (MVR) (Right) Subjective: Feels substantially more dyspneic especially with ambulation.  Objective  Telemetry SR pac's, pvc's  Temp:  [97.8 F (36.6 C)-98.2 F (36.8 C)] 98.2 F (36.8 C) (12/05 0347) Pulse Rate:  [84-92] 84  (12/05 0347) Resp:  [20] 20  (12/05 0347) BP: (98-107)/(61-68) 98/61 mmHg (12/05 0347) SpO2:  [90 %-94 %] 90 % (12/05 0347) Weight:  [215 lb 2.7 oz (97.6 kg)] 215 lb 2.7 oz (97.6 kg) (12/05 0347)   Intake/Output Summary (Last 24 hours) at 10/22/11 0932 Last data filed at 10/22/11 0600  Gross per 24 hour  Intake    720 ml  Output    551 ml  Net    169 ml       General appearance: alert, fatigued and mild distress Heart: slightly irregular Lungs: mild basilar crackles Abdomen: soft, non-tender; bowel sounds normal; no masses,  no organomegaly Extremities: edema 2+, bilat LE's  Wound: right groin appears stable, decub not examined, PT note reviewed  Lab Results: No results found for this basename: NA:2,K:2,CL:2,CO2:2,GLUCOSE:2,BUN:2,CREATININE:2,CALCIUM:2,MG:2,PHOS:2 in the last 72 hours No results found for this basename: AST:2,ALT:2,ALKPHOS:2,BILITOT:2,PROT:2,ALBUMIN:2 in the last 72 hours No results found for this basename: LIPASE:2,AMYLASE:2 in the last 72 hours No results found for this basename: WBC:2,NEUTROABS:2,HGB:2,HCT:2,MCV:2,PLT:2 in the last 72 hours No results found for this basename: CKTOTAL:4,CKMB:4,TROPONINI:4 in the last 72 hours No results found for this basename: POCBNP:3 in the last 72 hours No results found for this basename: DDIMER in the last 72 hours No results found for this basename: HGBA1C in the last 72 hours No results found for this basename: CHOL,HDL,LDLCALC,TRIG,CHOLHDL in the last 72 hours No results found for this basename: TSH,T4TOTAL,FREET3,T3FREE,THYROIDAB in the last 72 hours No results found for this basename:  VITAMINB12,FOLATE,FERRITIN,TIBC,IRON,RETICCTPCT in the last 72 hours  Medications: Scheduled    . amiodarone  200 mg Oral BID  . cephALEXin  500 mg Oral Q12H  . collagenase   Topical Daily  . cycloSPORINE  1 drop Both Eyes Q12H  . feeding supplement  1 Container Oral TID WC  . furosemide  40 mg Oral BID  . metoprolol tartrate  25 mg Oral BID  . multivitamins ther. w/minerals  1 tablet Oral Daily  . nystatin  5 mL Mouth/Throat QID  . pantoprazole  40 mg Oral QAC breakfast  . potassium chloride  40 mEq Oral BID  . povidone-iodine  1 application Topical BID  . traZODone  150 mg Oral QHS  . vitamin C  500 mg Oral Daily  . warfarin  2.5 mg Oral q1800  . DISCONTD: furosemide  40 mg Oral Daily  . DISCONTD: potassium chloride  20 mEq Oral Daily  . DISCONTD: potassium chloride  40 mEq Oral Daily     Radiology/Studies:  No results found.  INR: Will add last result for INR, ABG once components are confirmed Will add last 4 CBG results once components are confirmed  Assessment/Plan: S/P Procedure(s) (LRB): MINIMALLY INVASIVE MITRAL VALVE REPAIR (MVR) (Right) 1. Stable but CHF nay be worse, cont BID lasix, check BNP/bmet and cxr 2. inr 2.23 3. Cont wound care and keflex, culture decub wound with green drainage to rule out pseudomonas  LOS: 21 days    GOLD,[name] [name] 12/5/20129:32 AM  Dg Chest Port 1 View  10/22/2011  *RADIOLOGY REPORT*  Clinical Data: CHF, CHF, shortness of breath.  PORTABLE CHEST - 1 VIEW  Comparison: 10/14/2011  Findings: Bilateral pleural effusions, enlarging  since prior study. Bibasilar atelectasis or infiltrate.  Mild cardiomegaly.  Prior CABG and valve replacement. Left PICC line remains in place with the tip in the right atrium, unchanged.  IMPRESSION: Enlarging bilateral effusions and worsening bibasilar opacities.  Original Report Authenticated By: Cyndie Chime, M.D.   I have seen and examined [name] [name] and agree with the above assessment  and  plan. Very slow progress, still significant edema and increasing rt effusion. Consider Heart Failure consult  Delight Ovens MD 10/22/2011 4:18 PM

## 2011-10-22 NOTE — Progress Notes (Signed)
Physical Therapy Treatment Patient Details Name: [name] [name] MRN: [id number] DOB: [birth year] Today's Date: 10/22/2011  PT Assessment/Plan  PT - Assessment/Plan Comments on Treatment Session: Patient continues to make slow but steady progress.  Needs cues for sternal precautions.   PT Plan: Discharge plan remains appropriate;Frequency remains appropriate PT Frequency: Min 3X/week Follow Up Recommendations: Skilled nursing facility;24 hour supervision/assistance Equipment Recommended: Defer to next venue PT Goals  Acute Rehab PT Goals PT Goal: Supine/Side to Sit - Progress: Progressing toward goal PT Goal: Sit to Stand - Progress: Progressing toward goal PT Goal: Stand to Sit - Progress: Progressing toward goal PT Goal: Ambulate - Progress: Progressing toward goal PT Goal: Up/Down Stairs - Progress: Other (comment) PT Goal: Perform Home Exercise Program - Progress: Other (comment)  PT Treatment Precautions/Restrictions  Precautions Precautions: Fall;Sternal Precaution Comments: reinforced pillow use and reinforced sternal precautions.   Required Braces or Orthoses: No Restrictions Weight Bearing Restrictions: No Mobility (including Balance) Bed Mobility Rolling Right: Not tested (comment) Rolling Left: 5: Supervision Supine to Sit: 5: Supervision;HOB flat Supine to Sit Details (indicate cue type and reason): Followed precautions without cues. Sit to Supine - Left: Not tested (comment) Scooting to [facility]: Not tested (comment) Transfers Sit to Stand: 4: Min assist;From elevated surface;With upper extremity assist;From bed Sit to Stand Details (indicate cue type and reason): verbal cues to use legs and not arms.   Stand to Sit: 4: Min assist;With upper extremity assist;To chair/3-in-1 Stand to Sit Details: cues to lower with legs not arms.   Ambulation/Gait Ambulation/Gait Assistance: 4: Min assist Ambulation/Gait Assistance Details (indicate cue type and reason): min assist to  steady patient's trunk for balance while turning.  Pt. shaky throughout.   Ambulation Distance (Feet): 300 Feet Assistive device: Rolling walker Gait Pattern: Shuffle;Trunk flexed Gait velocity: decreased cadence Stairs: No Wheelchair Mobility Wheelchair Mobility: No  Posture/Postural Control Posture/Postural Control: No significant limitations Balance Balance Assessed: No Dynamic Standing Balance Dynamic Standing - Balance Support: Bilateral upper extremity supported Dynamic Standing - Level of Assistance: 4: Min assist Dynamic Standing - Balance Activities: Forward lean/weight shifting Exercise    End of Session PT - End of Session Equipment Utilized During Treatment: Gait belt Activity Tolerance: Patient limited by fatigue Patient left: in chair;with call bell in reach Nurse Communication: Mobility status for ambulation General Behavior During Session: [facility] for tasks performed Cognition: [facility] for tasks performed  INGOLD,Dorea Duff 10/22/2011, 1:24 PM [employer] Acute Rehabilitation [phone] (pager)

## 2011-10-22 NOTE — Progress Notes (Signed)
Attempted to see pt. For therapy x2, first attempt pt. Receiving hydrotherapy and 2nd attempt pt. Receiving chest xray. Will re-attempt as time allows. Thanks!  Cassandria Anger, OTR/L Pager: [phone] 10/22/2011 .

## 2011-10-22 NOTE — Progress Notes (Signed)
CARDIAC REHAB PHASE I   PRE:  Rate/Rhythm: 83 SR  BP:  Supine: 100/58  Sitting:   Standing:    SaO2: 87 RA in bed 92 RA on side of bed  MODE:  Ambulation: 380 ft   POST:  Rate/Rhythem: 103  BP:  Supine:   Sitting: 104/50  Standing:   SaO2: 96 2L in hall 95 2L after walk in room 1335-1405 Assisted X 2 used RW and O2 2L to ambulate. Pt c/o of SOB today. Took several standing rest stops and one sitting. To bed after walk with call light in reach. Continues to be shaky with walking worse as he tires. Beatrix Fetters

## 2011-10-23 LAB — WOUND CULTURE

## 2011-10-23 LAB — PROTIME-INR: INR: 2.57 — ABNORMAL HIGH (ref 0.00–1.49)

## 2011-10-23 MED ORDER — FUROSEMIDE 80 MG PO TABS
80.0000 mg | ORAL_TABLET | Freq: Two times a day (BID) | ORAL | Status: DC
  Administered 2011-10-23 – 2011-10-27 (×9): 80 mg via ORAL
  Filled 2011-10-23 (×12): qty 1

## 2011-10-23 MED ORDER — AMIODARONE HCL 200 MG PO TABS
200.0000 mg | ORAL_TABLET | Freq: Every day | ORAL | Status: DC
  Administered 2011-10-23 – 2011-11-01 (×10): 200 mg via ORAL
  Filled 2011-10-23 (×10): qty 1

## 2011-10-23 NOTE — Progress Notes (Addendum)
Physical Therapy Wound Treatment Patient Details  Name: [name] [name] MRN: [id number] Date of Birth: July 01, 1931  Today's Date: 10/23/2011 Time: 8295-6213 Time Calculation (min): 34 min  Subjective  Subjective: "Does it look better?" pt asked  Pain Score: Pain Score:   7  Wound Assessment  Pressure Ulcer 10/06/11 Stage II -  Partial thickness loss of dermis presenting as a shallow open ulcer with a red, pink wound bed without slough. Baseball sized reddened area on both sides of buttocks, non blanchable, skin irritation. (Active)  State of Healing Early/partial granulation 10/23/2011  9:00 AM  Site / Wound Assessment Red;Pink 10/23/2011  9:00 AM  % Wound base Red or Granulating 95 10/23/2011  7:00 AM  % Wound base Yellow 5% 10/23/2011  7:00 AM  Peri-wound Assessment Induration 10/23/2011  9:00 AM  Wound Length (cm) 6 cm 10/23/2011  9:00 AM  Wound Width (cm) 4 cm 10/23/2011  9:00 AM  Wound Depth (cm) 0.2 cm 10/23/2011  9:00 AM  Drainage Amount Copious 10/23/2011  9:00 AM  Drainage Description Sanguineous 10/23/2011  9:00 AM  Treatment Debridement (Selective);Hydrotherapy (Pulse lavage) 10/23/2011  9:00 AM  Dressing Type Silver hydrofiber;Barrier Film (skin prep);ABD 10/23/2011  9:00 AM  Dressing Changed 10/23/2011  9:00 AM     Hydrotherapy Pulsed lavage therapy - wound location: lt. buttock, inner gluteal fold Pulsed Lavage with Suction (psi):  (4-8 psi) Pulsed Lavage with Suction - Normal Saline Used: 1000 mL Pulsed Lavage Tip: Tip with splash shield Selective Debridement Selective Debridement - Location: lt. buttock Selective Debridement - Tools Used: Forceps;Scissors Selective Debridement - Tissue Removed: blistered skin   Wound Assessment and Plan  Wound Therapy - Assess/Plan/Recommendations Wound Therapy - Clinical Statement: Today wound with large amount of dark red drainage. Wound bed with big decrease in yellow slough today.  Inner gluteal fold with 1 x .3 open area with yellow  slough.  Started Aquacel Ag. Factors Delaying/Impairing Wound Healing: Immobility;Multiple medical problems;Polypharmacy Hydrotherapy Plan: Debridement;Dressing change;Patient/family education;Pulsatile lavage with suction Wound Therapy - Frequency: 6X / week Wound Therapy - Follow Up Recommendations: Skilled nursing facility  Wound Therapy Goals- Improve the function of patient's integumentary system by progressing the wound(s) through the phases of wound healing (inflammation - proliferation - remodeling) by: Decrease Necrotic Tissue to: 0% Decrease Necrotic Tissue - Progress: Revised (modified due to lack of progress/goal met) Increase Granulation Tissue to: 100% Increase Granulation Tissue - Progress: Revised (modified due to lack of progress/goal met) Improve Drainage Characteristics: Mod Improve Drainage Characteristics - Progress: Not met Patient/Family will be able to : verbalize postioning Patient/Family Instruction Goal - Progress: Revised (modified due to lack of progress/goal met) Time For Goal Achievement: 7 days Wound Therapy - Potential for Goals: Good  Goals will be updated until maximal potential achieved or discharge criteria met.  Discharge criteria: when goals achieved, discharge from hospital, MD decision/surgical intervention, no progress towards goals, refusal/missing three consecutive treatments without notification or medical reason.  Tarita Deshmukh 10/23/2011, 10:02 AM [name] PT [phone]

## 2011-10-23 NOTE — Progress Notes (Addendum)
22 Days Post-Op Procedure(s) (LRB): MINIMALLY INVASIVE MITRAL VALVE REPAIR (MVR) (Right)  Subjective: Still c/o SOB, feels about the same as yesterday.  Objective: Vital signs in last 24 hours: Patient Vitals for the past 24 hrs:  BP Temp Temp src Pulse Resp SpO2 Weight  10/23/11 0502 111/66 mmHg 98.4 F (36.9 C) Oral 78  18  61 % 214 lb 9.6 oz (97.342 kg)  10/22/11 2155 118/72 mmHg 98.2 F (36.8 C) Oral 86  18  94 % -  10/22/11 1307 103/63 mmHg 98.1 F (36.7 C) Oral 80  18  98 % -  10/22/11 1125 - - - - - 92 % -   Current Weight  10/23/11 214 lb 9.6 oz (97.342 kg)   Pre-op weight= 92 kg  Intake/Output from previous day: 12/05 0701 - 12/06 0700 In: 720 [P.O.:720] Out: 2053 [Urine:2050; Stool:3]    PHYSICAL EXAM:  Heart: RRR Lungs: decreased BS in bases Wound: chest wounds healing well.  Right groin wound with small amount purulent appearing drainage Extremities: edematous bilaterally  Lab Results: CBC:No results found for this basename: WBC:2,HGB:2,HCT:2,PLT:2 in the last 72 hours BMET:  Basename 10/22/11 1020  NA 136  K 4.0  CL 103  CO2 28  GLUCOSE 126*  BUN 23  CREATININE 1.13  CALCIUM 8.3*    PT/INR:  Basename 10/23/11 0530  LABPROT 28.0*  INR 2.57*  Groin wound culture- multiple organisms, no staph, no strep Decubitus Wound culture- no organisms seen   Assessment/Plan: S/P Procedure(s) (LRB): MINIMALLY INVASIVE MITRAL VALVE REPAIR (MVR) (Right)  Vol overload/increasing right effusion- diuresed well yesterday, but still very edematous, weight up 6 kg from pre-op.  Will continue aggressive diuresis, follow up CXR, check labs in am Continue local wound care, abx for right groin wound. Decubitus- per wound care RN, improving slowly. Continue current tx.   Coumadin for MV repair    LOS: 22 days    COLLINS,GINA H 10/23/2011   seen and examined. Agree with above

## 2011-10-23 NOTE — Progress Notes (Signed)
Subjective:  Still very dyspneic with exertion.  Chest xray yesterday showed enlarging bilat pleural effusions. Weight down only 1 lb since lasix was doubled.  Objective:  Vital Signs in the last 24 hours: Temp:  [98.1 F (36.7 C)-98.4 F (36.9 C)] 98.4 F (36.9 C) (12/06 0502) Pulse Rate:  [78-86] 78  (12/06 0502) Resp:  [18] 18  (12/06 0502) BP: (103-118)/(63-72) 111/66 mmHg (12/06 0502) SpO2:  [61 %-98 %] 61 % (12/06 0502) Weight:  [214 lb 9.6 oz (97.342 kg)] 214 lb 9.6 oz (97.342 kg) (12/06 0502)  Intake/Output from previous day: 12/05 0701 - 12/06 0700 In: 720 [P.O.:720] Out: 2053 [Urine:2050; Stool:3] Intake/Output from this shift:       . amiodarone  200 mg Oral BID  . cephALEXin  500 mg Oral Q12H  . collagenase   Topical Daily  . cycloSPORINE  1 drop Both Eyes Q12H  . feeding supplement  1 Container Oral TID WC  . furosemide  40 mg Oral BID  . metoprolol tartrate  25 mg Oral BID  . multivitamins ther. w/minerals  1 tablet Oral Daily  . nystatin  5 mL Mouth/Throat QID  . pantoprazole  40 mg Oral QAC breakfast  . potassium chloride  40 mEq Oral BID  . povidone-iodine  1 application Topical BID  . traZODone  150 mg Oral QHS  . vitamin C  500 mg Oral Daily  . warfarin  2.5 mg Oral q1800      Physical Exam: The patient appears to be in no distress.  Head and neck exam reveals that the pupils are equal and reactive.  The extraocular movements are full.  There is no scleral icterus.  Mouth and pharynx are benign.  No lymphadenopathy.  No carotid bruits.  The jugular venous pressure is normal.  Thyroid is not enlarged or tender.  Chest is clear to percussion and auscultation.  No rales or rhonchi.  Expansion of the chest is symmetrical. Dullness at bases.  Heart reveals no abnormal lift or heave.  First and second heart sounds are normal.  There is no murmur gallop rub or click.  The abdomen is soft and nontender.  Bowel sounds are normoactive.  There is no  hepatosplenomegaly or mass.  There are no abdominal bruits.  Extremities reveal no phlebitis.  There is significant pretibial edema.  Neurologic exam is normal strength and no lateralizing weakness.  No sensory deficits.    Lab Results: No results found for this basename: WBC:2,HGB:2,PLT:2 in the last 72 hours  Basename 10/22/11 1020  NA 136  K 4.0  CL 103  CO2 28  GLUCOSE 126*  BUN 23  CREATININE 1.13   No results found for this basename: TROPONINI:2,CK,MB:2 in the last 72 hours Hepatic Function Panel No results found for this basename: PROT,ALBUMIN,AST,ALT,ALKPHOS,BILITOT,BILIDIR,IBILI in the last 72 hours No results found for this basename: CHOL in the last 72 hours No results found for this basename: PROTIME in the last 72 hours  Imaging: Imaging results have been reviewed  Cardiac Studies: Telemetry shows NSR with first degree block. Assessment/Plan:  Patient Active Problem List  Diagnoses  . TINEA PEDIS  . ADENOCARCINOMA, PROSTATE  . HYPERCHOLESTEROLEMIA  . THROMBOCYTOPENIA  . DEPRESSION  . HYPERTENSION, BENIGN ESSENTIAL  . GERD  . DERMATITIS, ATOPIC  . CALLUS, LEFT FOOT  . OSTEOARTHRITIS, GENERALIZED, MULTIPLE JOINTS  . BACK PAIN, CHRONIC  . MUSCLE SPASM, BACK  . PERSONAL HISTORY MALIGNANT NEOPLASM PROSTATE  . PERSONAL HISTORY OF MALIGNANT MELANOMA  OF SKIN  . ARRHYTHMIA, HX OF  . PERSONAL HISTORY OF COLONIC POLYPS  . Cerumen impaction  . Hearing loss  . Valvular heart disease  . S/P mitral valve repair  . Atrial fibrillation  . Pneumothorax, postoperative  Plan: Will double Lasix to 80 mg BID.      Will decrease amiodarone to 200 mg daily    LOS: 22 days    [name] [name] 10/23/2011, 8:53 AM

## 2011-10-23 NOTE — Consult Note (Signed)
Wound care follow-up:  Assessed buttocks and sacrum wounds with physical therapy during hydrotherapy.  Wounds continue to have decreasing amount of non-viable tissue.  Right buttock stage 2 .2X.2X.2cm, red and dry without odor or drainage.  Inner gluteal fold with 1X.3cm area of unstageable wound with yellow slough, small green-pink drainage, protruding skin-tag type lesion.  Left buttock has recently shown increased green drainage for several days.  Today, it has large amount of dark red drainage.  Wound 6X4X.2cm, 80%dark red, 15% bright red, 5% yellow.  Buttocks and sacrum with erythemia surrounding all wounds, hard to touch but not painful according to patient.  Affected area 10X10cm.  Pt has large amount of generalized edema to body.  Air mattress in place to decrease pressure.    Plan:  Continue hydrotherapy while inpatient.  Since wound continues to improve, this therapy does not appear to be necessary after discharge.  According to progress notes, pt will be followed up for additional assessment at outpatient wound care center.  Will d/c Santyl and begin Aquacel to provide antimicrobial benefits and absorb drainage.  Limit sitting in chair to 2 hours to reduce pressure to site.  Continue air mattress when D/C to SNF.   Cammie Mcgee, RN, MSN, [employer]  [phone]

## 2011-10-23 NOTE — Progress Notes (Signed)
Occupational Therapy Treatment Patient Details Name: [name] [name] MRN: [id number] DOB: Jun 08, 1931 Today's Date: 10/23/2011  OT Assessment/Plan OT Assessment/Plan Comments on Treatment Session: Pt. very motivated to participate with therapy today and with increased endurance with functional activities. OT Plan: Discharge plan remains appropriate OT Frequency: Min 1X/week Follow Up Recommendations: Skilled nursing facility Equipment Recommended: Defer to next venue OT Goals Acute Rehab OT Goals OT Goal Formulation: With patient Time For Goal Achievement: 2 weeks ADL Goals Pt Will Perform Grooming: with set-up;with supervision;Standing at sink ADL Goal: Grooming - Progress: Progressing toward goals Pt Will Perform Lower Body Bathing: with min assist;Sit to stand from bed ADL Goal: Lower Body Bathing - Progress: Progressing toward goals Pt Will Perform Lower Body Dressing: with min assist;with adaptive equipment;Sit to stand from bed ADL Goal: Lower Body Dressing - Progress: Progressing toward goals Pt Will Transfer to Toilet: with supervision;3-in-1;Stand pivot transfer ADL Goal: Toilet Transfer - Progress: Progressing toward goals Pt Will Perform Toileting - Hygiene: with set-up;Sit to stand from 3-in-1/toilet ADL Goal: Toileting - Hygiene - Progress: Not addressed Additional ADL Goal #1: able to name approx. 2 with demonstration  *All goals remain appropriate and will continue for 2 weeks achievement. Some goals not met because pt. With decreased activity tolerance, however is improving.  OT Treatment Precautions/Restrictions  Precautions Precautions: Fall;Sternal Precaution Comments: reinforced pillow use and reinforced sternal precautions.   Required Braces or Orthoses: No Restrictions Weight Bearing Restrictions: No   ADL ADL Grooming: Performed;Wash/dry hands;Wash/dry face;Teeth care;Set up;Minimal assistance Grooming Details (indicate cue type and reason): Pt.  completed ADL tasks ~ in standing with intermittent upper extremity support on countertop due to fatigue and educated pt. on deep breathing exercises secondary to SOB. Where Assessed - Grooming: Standing at sink Lower Body Bathing: Performed;Maximal assistance Lower Body Bathing Details (indicate cue type and reason): Pt. unable to cross foot over opposite knee to complete Where Assessed - Lower Body Bathing: Sitting, chair Upper Body Dressing: Performed;Minimal assistance Upper Body Dressing Details (indicate cue type and reason): With don gown Where Assessed - Upper Body Dressing: Sitting, chair Lower Body Dressing: Performed;Maximal assistance Lower Body Dressing Details (indicate cue type and reason): pt. with difficulty leaning forward to reach feet, but able to cross L/R and could reach a part of his foot but still required some assistance to pull sock over foot Where Assessed - Lower Body Dressing: Sitting, chair Toilet Transfer: Performed;Minimal assistance Toilet Transfer Details (indicate cue type and reason): Mod verbal cues for hand placement on knees to facilitate upright position Toilet Transfer Method: Ambulating Toilet Transfer Equipment: Other (comment) (with chair) Toileting - Clothing Manipulation: Not assessed Equipment Used: Rolling walker ADL Comments: Pt. completed ADL tasks in standing with 1 seated rest break after ~72mins of standing due to fatigue. Increased time for hygiene and bathing due to pt. incontinent of urine.  Mobility  Bed Mobility Bed Mobility: Yes Rolling Right: Not tested (comment) Rolling Left: 5: Supervision Rolling Left Details (indicate cue type and reason): Min cues for technique and to follow sternal precautions Supine to Sit: 5: Supervision;HOB flat Transfers Transfers: Yes Sit to Stand: 4: Min assist;From elevated surface;With upper extremity assist;From bed Sit to Stand Details (indicate cue type and reason): Min verbal cues to follow  sternal precautions     End of Session OT - End of Session Equipment Utilized During Treatment: Gait belt Activity Tolerance: Patient tolerated treatment well Patient left: in chair;with family/visitor present (With Cardiac rehab present to ambulate pt.)  Nurse Communication: Mobility status for transfers General Behavior During Session: [facility] for tasks performed Cognition: [facility] for tasks performed  Bettye Sitton, OTR/L Pager [phone]  10/23/2011, 9:11 AM

## 2011-10-23 NOTE — Progress Notes (Signed)
CSW advised SNF of possible d/c tomorrow and spoke with pt daughter by phone regarding pt disposition. CSW will continue to follow too coordinate d/c to SNF.  Baxter Flattery, MSW [phone]

## 2011-10-23 NOTE — Progress Notes (Signed)
Pt ambulated 250 ft with RW and 2 assist on RA.  One sitting break, SPO2 89-95%.  3 standing rest breaks.  To chair after walk with call bell in reach.

## 2011-10-23 NOTE — Progress Notes (Signed)
CARDIAC REHAB PHASE I   PRE:  Rate/Rhythm: 105 First Degreee    BP: sitting 110/66    SaO2: 89-90 RA  MODE:  Ambulation: 400 ft   POST:  Rate/Rhythm: 117    BP: sitting 126/70     SaO2: 88 RA in hall, applied 2L, 93 2L after walk  Pt continues to be SOB with activity with SaO2 low. Sat x1 and applied 2L O2. Felt better on oxygen. Assit x2 with RW. Return to bed for hydrotherapy. [id number]  Harriet Masson CES, ACSM

## 2011-10-24 ENCOUNTER — Telehealth: Payer: Self-pay | Admitting: Oncology

## 2011-10-24 ENCOUNTER — Inpatient Hospital Stay (HOSPITAL_COMMUNITY): Payer: Medicare Other

## 2011-10-24 LAB — CBC
MCHC: 30.9 g/dL (ref 30.0–36.0)
Platelets: 64 10*3/uL — ABNORMAL LOW (ref 150–400)
RDW: 16.1 % — ABNORMAL HIGH (ref 11.5–15.5)
WBC: 10.8 10*3/uL — ABNORMAL HIGH (ref 4.0–10.5)

## 2011-10-24 LAB — GLUCOSE, CAPILLARY
Glucose-Capillary: 127 mg/dL — ABNORMAL HIGH (ref 70–99)
Glucose-Capillary: 111 mg/dL — ABNORMAL HIGH (ref 70–99)
Glucose-Capillary: 124 mg/dL — ABNORMAL HIGH (ref 70–99)

## 2011-10-24 LAB — BASIC METABOLIC PANEL
Calcium: 8.2 mg/dL — ABNORMAL LOW (ref 8.4–10.5)
Creatinine, Ser: 1.16 mg/dL (ref 0.50–1.35)
GFR calc Af Amer: 68 mL/min — ABNORMAL LOW (ref >90)
GFR calc non Af Amer: 58 mL/min — ABNORMAL LOW (ref >90)

## 2011-10-24 LAB — PROTIME-INR
INR: 2.76 — ABNORMAL HIGH (ref 0.00–1.49)
Prothrombin Time: 29.6 seconds — ABNORMAL HIGH (ref 11.6–15.2)

## 2011-10-24 MED ORDER — METOLAZONE 2.5 MG PO TABS
2.5000 mg | ORAL_TABLET | Freq: Once | ORAL | Status: AC
  Administered 2011-10-24: 2.5 mg via ORAL
  Filled 2011-10-24: qty 1

## 2011-10-24 MED ORDER — GUAIFENESIN ER 600 MG PO TB12
600.0000 mg | ORAL_TABLET | Freq: Two times a day (BID) | ORAL | Status: DC
  Administered 2011-10-24 – 2011-11-01 (×17): 600 mg via ORAL
  Filled 2011-10-24 (×18): qty 1

## 2011-10-24 MED ORDER — WARFARIN SODIUM 1 MG PO TABS
1.0000 mg | ORAL_TABLET | Freq: Every day | ORAL | Status: DC
  Administered 2011-10-24 – 2011-10-25 (×2): 1 mg via ORAL
  Filled 2011-10-24 (×3): qty 1

## 2011-10-24 NOTE — Telephone Encounter (Signed)
lVM ADVISING APPT 12/09/11 @ 2.30PM r/s'd from 11/15 appt cx'd due to Epic

## 2011-10-24 NOTE — Progress Notes (Addendum)
23 Days Post-Op Procedure(s) (LRB): MINIMALLY INVASIVE MITRAL VALVE REPAIR (MVR) (Right)  Subjective: Breathing much improved from yesterday.  Today mostly c/o nasal stuffiness.    Objective: Vital signs in last 24 hours: Patient Vitals for the past 24 hrs:  BP Temp Temp src Pulse Resp SpO2 Weight  10/24/11 1400 90/51 mmHg 97.1 F (36.2 C) Oral 81  20  90 % -  10/24/11 0906 - - - 92  - 92 % -  10/24/11 0905 - - - 92  - 88 % -  10/24/11 0900 - - - 105  - 92 % -  10/24/11 0840 - - - 85  - 89 % -  10/24/11 0440 106/64 mmHg 98.4 F (36.9 C) Oral 84  18  90 % 212 lb 6.4 oz (96.344 kg)  10/23/11 2139 104/59 mmHg 98.6 F (37 C) Oral 87  19  85 % -   Current Weight  10/24/11 212 lb 6.4 oz (96.344 kg)   Pre-op wt=92 kg  Intake/Output from previous day: 12/06 0701 - 12/07 0700 In: 720 [P.O.:720] Out: 2277 [Urine:2275; Stool:2]    PHYSICAL EXAM:  Heart: RRR Lungs: decreased BS bilaterally Wound: Right groin with good granulation tissue, chest stable, decub per PT note Extremities: edematous in LEs  Lab Results: CBC: Basename 10/24/11 0610  WBC 10.8*  HGB 9.6*  HCT 31.1*  PLT 64*   BMET:  Basename 10/24/11 0610 10/22/11 1020  NA 137 136  K 4.1 4.0  CL 101 103  CO2 29 28  GLUCOSE 121* 126*  BUN 22 23  CREATININE 1.16 1.13  CALCIUM 8.2* 8.3*    PT/INR:  Basename 10/24/11 0610  LABPROT 29.6*  INR 2.76*     Assessment/Plan: S/P Procedure(s) (LRB): MINIMALLY INVASIVE MITRAL VALVE REPAIR (MVR) (Right)   Vol overload- weight slowly coming down.  Continue diuresis.  Local wound care to right groin and sacral decubitus Coumadin for MV repair- will decrease to 1 mg tonight since INR is high ? to SNF next week    LOS: 23 days    [name],[name] H 10/24/2011   looks little stronger today Chest xray shows rt effusion better slightly more on left If does not improve with diuretic will consider thoracentesis  I have seen and examined [name] [name] and agree  with the above assessment  and plan.  Delight Ovens MD [facility] [phone] Office [phone] 10/24/2011 3:36 PM

## 2011-10-24 NOTE — Progress Notes (Signed)
Pt ambulated 250 ft with RW on 2L O2 with 2 assist.  1 standing rest break, 1 sitting rest break.  No pain, or dizziness, mild SOB and fatigue.  Returned to bed with call bed reach.  SPO2 95% on 2L aftter walk.  Removed O2, initial drop to 89% but returned to 92-95% after less than 2 mins.  Left on RA.  Pt verbalizes goal of one more walk this evening before sleep.  Will report thoroughly to night nurse.

## 2011-10-24 NOTE — Progress Notes (Signed)
Pt ambulated 400 ft on room air, pt tolerated activity well, but complained of shortness of breath sats dropped to the mid 80s. 2L of O2 were applied and sats increased to the high 90s.

## 2011-10-24 NOTE — Progress Notes (Signed)
Physical Therapy Treatment Patient Details Name: [name] [name] MRN: [id number] DOB: June 04, 1931 Today's Date: 10/24/2011  PT Assessment/Plan  PT - Assessment/Plan Comments on Treatment Session: Nursing reports low 02 sats, which is new for pt, and is delaying his DC. Pt on 2L 02 for gait today, with sats dropping to 88% after gait, returning to >92% within 30 sec. Pt reports congested nose, limiting his inhalation. Pt continues to have decreased activity tolerance and safety awareness, but is making steady gains in mobility and gait.  PT Plan: Discharge plan remains appropriate Equipment Recommended: Defer to next venue PT Goals  Acute Rehab PT Goals PT Goal: Supine/Side to Sit - Progress: Progressing toward goal PT Goal: Sit to Stand - Progress: Met PT Goal: Stand to Sit - Progress: Met PT Goal: Ambulate - Progress: Progressing toward goal PT Goal: Up/Down Stairs - Progress: Other (comment) PT Goal: Perform Home Exercise Program - Progress: Progressing toward goal  PT Treatment Precautions/Restrictions  Precautions Precautions: Fall;Sternal Precaution Comments: reinforced pillow use and reinforced sternal precautions.   Required Braces or Orthoses: No Restrictions Weight Bearing Restrictions: No Mobility (including Balance) Bed Mobility Bed Mobility: Yes Rolling Left: 5: Supervision (Flat HOB) Rolling Left Details (indicate cue type and reason): Cues for technique and sequence to adhere to sternal precautions, pt continues to initially rely on UE use Supine to Sit: 5: Supervision;HOB flat Supine to Sit Details (indicate cue type and reason): Cues for initiation and hand placement Transfers Sit to Stand: From elevated surface;With upper extremity assist;From bed;5: Supervision Sit to Stand Details (indicate cue type and reason): Max cues for hand placement on knees - pt not able to recall cues from yesterday Stand to Sit: 5: Supervision;With upper extremity assist;To  chair/3-in-1 Stand to Sit Details: Cues to adhere to sternal precautions - good RW management, bringing device all the way to chair with turn Ambulation/Gait Ambulation/Gait: Yes Ambulation/Gait Assistance: 4: Min assist Ambulation/Gait Assistance Details (indicate cue type and reason): Pt able to adjust posture with cues today, but limited due to fatigue, requiring 2 standing and 1 sitting rest between 75' distances. Pt amb on 2L 02 this tx due to 89% 02 sats at rest this morning. 02 dropped to 88% after gait, but returned to >92% within 30 sec. As pt became fatigued, his velocity increased in attempts to finish faster, rather than ask for another rest break. Educated pt on energy conservation, rest breaks, and safety.  Ambulation Distance (Feet): 150 Feet (1 sitting and 2 standing rest breaks after each 75' distance) Assistive device: Rolling walker Gait Pattern: Shuffle;Trunk flexed Gait velocity: decreased cadence Stairs: No    Exercise  General Exercises - Lower Extremity Ankle Circles/Pumps: AROM;Strengthening;Both;20 reps;Seated Long Arc Quad: AROM;Strengthening;Both;10 reps;Seated Hip Flexion/Marching: AROM;Strengthening;10 reps;Seated End of Session PT - End of Session Equipment Utilized During Treatment: Gait belt Activity Tolerance: Patient limited by fatigue Patient left: in chair;with call bell in reach Nurse Communication: Mobility status for ambulation (Pt left on 02) General Behavior During Session: [facility] for tasks performed Cognition: [facility] for tasks performed  [facility] [place], [place]  [phone] 10/24/2011, 9:28 AM

## 2011-10-24 NOTE — Progress Notes (Signed)
Physical Therapy Wound Treatment Patient Details  Name: DQUAN CORTOPASSI MRN: [id number] Date of Birth: [birth year]  Today's Date: 10/24/2011 Time: 1191-4782 Time Calculation (min): 30 min  Subjective  Subjective: "Does it look any better?"  Pain Score:    Wound Assessment  Pressure Ulcer 10/06/11 Stage II -  Partial thickness loss of dermis presenting as a shallow open ulcer with a red, pink wound bed without slough. Baseball sized reddened area on both sides of buttocks, non blanchable, skin irritation. (Active)  State of Healing Early/partial granulation 10/24/2011 10:35 AM  Site / Wound Assessment Pink;Red 10/24/2011 10:35 AM  % Wound base Red or Granulating 95% (80% dark red, 15% bright red) 10/24/2011  7:00 AM  % Wound base Yellow 5% 10/24/2011  7:00 AM  Peri-wound Assessment Induration 10/24/2011 10:35 AM  Wound Length (cm) 6 cm 10/24/2011 10:35 AM  Wound Width (cm) 4 cm 10/24/2011 10:35 AM  Wound Depth (cm) 0.2 cm 10/24/2011 10:35 AM  Drainage Amount Moderate 10/24/2011 10:35 AM  Drainage Description Serosanguineous 10/24/2011 10:35 AM  Treatment Hydrotherapy (Pulse lavage);Debridement (Selective) 10/24/2011 10:35 AM  Dressing Type ABD;Silver hydrofiber;Barrier Film (skin prep) 10/24/2011 10:35 AM  Dressing Changed 10/24/2011 10:35 AM     Wound 10/06/11 Other (Comment) Sacrum Mid (Active)                                                                                                                           Hydrotherapy Pulsed lavage therapy - wound location: lt. buttock, inner gluteal fold Pulsed Lavage with Suction (psi):  (4-8psi) Pulsed Lavage with Suction - Normal Saline Used: 1000 mL Pulsed Lavage Tip: Tip with splash shield Selective Debridement Selective Debridement - Location: lt. buttock Selective Debridement - Tools Used: Forceps;Scissors Selective Debridement - Tissue Removed: blistered skin   Wound Assessment and Plan  Wound Therapy -  Assess/Plan/Recommendations Wound Therapy - Clinical Statement: Much decreased drainage today. Still with slough in inner gluteal fold and less on L buttock.   Factors Delaying/Impairing Wound Healing: Immobility;Multiple medical problems;Polypharmacy Hydrotherapy Plan: Debridement;Dressing change;Patient/family education;Pulsatile lavage with suction Wound Therapy - Frequency: 6X / week Wound Therapy - Follow Up Recommendations: Skilled nursing facility  Wound Therapy Goals- Improve the function of patient's integumentary system by progressing the wound(s) through the phases of wound healing (inflammation - proliferation - remodeling) by: Decrease Necrotic Tissue to: 0% Decrease Necrotic Tissue - Progress: Progressing toward goal Increase Granulation Tissue to: 100% Increase Granulation Tissue - Progress: Progressing toward goal Improve Drainage Characteristics: Mod Improve Drainage Characteristics - Progress: Progressing toward goal Patient/Family will be able to : verbalize postioning Patient/Family Instruction Goal - Progress: Progressing toward goal Goals/treatment plan/discharge plan were made with and agreed upon by patient/family: Yes Time For Goal Achievement: 7 days Wound Therapy - Potential for Goals: Good  Goals will be updated until maximal potential achieved or discharge criteria met.  Discharge criteria: when goals achieved, discharge from hospital, MD decision/surgical intervention, no progress towards goals, refusal/missing three consecutive  treatments without notification or medical reason.  Sunny Schlein, [place] [phone] 10/24/2011, 12:02 PM

## 2011-10-24 NOTE — Progress Notes (Signed)
Subjective:  Ambulation slightly improved. Complains of nasal stuffiness and sinus congestion. Mouth less sore Telemetry NSR  Objective:  Vital Signs in the last 24 hours: Temp:  [98.4 F (36.9 C)-98.6 F (37 C)] 98.4 F (36.9 C) (12/07 0440) Pulse Rate:  [81-105] 92  (12/07 0906) Resp:  [18-19] 18  (12/07 0440) BP: (96-106)/(56-64) 106/64 mmHg (12/07 0440) SpO2:  [85 %-92 %] 92 % (12/07 0906) Weight:  [212 lb 6.4 oz (96.344 kg)] 212 lb 6.4 oz (96.344 kg) (12/07 0440)  Intake/Output from previous day: 12/06 0701 - 12/07 0700 In: 720 [P.O.:720] Out: 2277 [Urine:2275; Stool:2] Intake/Output from this shift:       . amiodarone  200 mg Oral Daily  . cephALEXin  500 mg Oral Q12H  . cycloSPORINE  1 drop Both Eyes Q12H  . feeding supplement  1 Container Oral TID WC  . furosemide  80 mg Oral BID  . guaiFENesin  600 mg Oral BID  . metoprolol tartrate  25 mg Oral BID  . multivitamins ther. w/minerals  1 tablet Oral Daily  . nystatin  5 mL Mouth/Throat QID  . pantoprazole  40 mg Oral QAC breakfast  . potassium chloride  40 mEq Oral BID  . povidone-iodine  1 application Topical BID  . traZODone  150 mg Oral QHS  . vitamin C  500 mg Oral Daily  . warfarin  2.5 mg Oral q1800      Physical Exam: The patient appears to be in no distress.  Head and neck exam reveals that the pupils are equal and reactive.  The extraocular movements are full.  There is no scleral icterus.  Mouth and pharynx are benign.  No lymphadenopathy.  No carotid bruits.  The jugular venous pressure is normal.  Thyroid is not enlarged or tender.  Chest reveals decreased breath sounds at bases.  No wheezing.  Heart reveals no abnormal lift or heave.  First and second heart sounds are normal.  There is no murmur gallop rub or click.  The abdomen is soft and nontender.  Bowel sounds are normoactive.  There is no hepatosplenomegaly or mass.  There are no abdominal bruits.  Extremities reveal no phlebitis or  edema.  Pedal pulses are good.  There is no cyanosis or clubbing.  Neurologic exam is normal strength and no lateralizing weakness.  No sensory deficits.  Integument reveals lower extremities rash over areas of edema  Lab Results:  [facility] 10/24/11 0610  WBC 10.8*  HGB 9.6*  PLT 64*    Basename 10/24/11 0610 10/22/11 1020  NA 137 136  K 4.1 4.0  CL 101 103  CO2 29 28  GLUCOSE 121* 126*  BUN 22 23  CREATININE 1.16 1.13   No results found for this basename: TROPONINI:2,CK,MB:2 in the last 72 hours Hepatic Function Panel No results found for this basename: PROT,ALBUMIN,AST,ALT,ALKPHOS,BILITOT,BILIDIR,IBILI in the last 72 hours No results found for this basename: CHOL in the last 72 hours No results found for this basename: PROTIME in the last 72 hours  Imaging: Imaging results have been reviewed  Cardiac Studies:  Assessment/Plan:  Patient Active Problem List  Diagnoses  . TINEA PEDIS  . ADENOCARCINOMA, PROSTATE  . HYPERCHOLESTEROLEMIA  . THROMBOCYTOPENIA  . DEPRESSION  . HYPERTENSION, BENIGN ESSENTIAL  . GERD  . DERMATITIS, ATOPIC  . CALLUS, LEFT FOOT  . OSTEOARTHRITIS, GENERALIZED, MULTIPLE JOINTS  . BACK PAIN, CHRONIC  . MUSCLE SPASM, BACK  . PERSONAL HISTORY MALIGNANT NEOPLASM PROSTATE  . PERSONAL HISTORY  OF MALIGNANT MELANOMA OF SKIN  . ARRHYTHMIA, HX OF  . PERSONAL HISTORY OF COLONIC POLYPS  . Cerumen impaction  . Hearing loss  . Valvular heart disease  . S/P mitral valve repair  . Atrial fibrillation  . Pneumothorax, postoperative  Continue present cardiac meds.  Weight down 1 lb overnight.  Will give zaroxolyn 2.5 once today for additional diuresis. Will add mucinex for nasal congestion.    LOS: 23 days    [name] [name] 10/24/2011, 9:35 AM

## 2011-10-24 NOTE — Progress Notes (Signed)
CSW advised pt will possibly d/c Monday. CSW contacted SNF and pt daughter to update as to pt dispo and will continue to follow.  Baxter Flattery, MSW [phone]

## 2011-10-25 LAB — PROTIME-INR
INR: 2.46 — ABNORMAL HIGH (ref 0.00–1.49)
Prothrombin Time: 27.1 seconds — ABNORMAL HIGH (ref 11.6–15.2)

## 2011-10-25 LAB — WOUND CULTURE: Gram Stain: NONE SEEN

## 2011-10-25 LAB — GLUCOSE, CAPILLARY

## 2011-10-25 NOTE — Progress Notes (Signed)
24 Days Post-Op Procedure(s) (LRB): MINIMALLY INVASIVE MITRAL VALVE REPAIR (MVR) (Right)  Subjective: Stable, no new complaints   Objective: Vital signs in last 24 hours: Patient Vitals for the past 24 hrs:  BP Temp Temp src Pulse Resp SpO2 Weight  10/25/11 0500 - - - - - - 82.464 kg (181 lb 12.8 oz)  10/25/11 0459 104/66 mmHg 98 F (36.7 C) Oral 86  17  93 % -  10/24/11 2332 104/62 mmHg 98.4 F (36.9 C) Oral 84  18  93 % -  10/24/11 2151 118/64 mmHg - - 90  - - -  10/24/11 1400 90/51 mmHg 97.1 F (36.2 C) Oral 81  20  90 % -   Current Weight  10/25/11 82.464 kg (181 lb 12.8 oz)     Intake/Output from previous day: 12/07 0701 - 12/08 0700 In: -  Out: 2700 [Urine:2700]    PHYSICAL EXAM:  Heart: RRR Lungs: sl diminished BS in bases Wound: stable, right groin granulating well Extremities:+ edema  Lab Results: CBC: Basename 10/24/11 0610  WBC 10.8*  HGB 9.6*  HCT 31.1*  PLT 64*   BMET:  Basename 10/24/11 0610  NA 137  K 4.1  CL 101  CO2 29  GLUCOSE 121*  BUN 22  CREATININE 1.16  CALCIUM 8.2*    PT/INR:  Basename 10/25/11 0500  LABPROT 27.1*  INR 2.46*     Assessment/Plan: S/P Procedure(s) (LRB): MINIMALLY INVASIVE MITRAL VALVE REPAIR (MVR) (Right) Vol overload/effusions- cont diuresis.  F/U CXR in am Local wound care to right groin and sacral decubitus Anticoagulation Hopefully to SNF next week   LOS: 24 days    Julene Rahn H 10/25/2011

## 2011-10-25 NOTE — Progress Notes (Signed)
Physical Therapy Wound Treatment Patient Details  Name: [name] [name] MRN: [id number] Date of Birth: 02/09/1931  Today's Date: 10/25/2011 Time: 0830-0900 Time Calculation (min): 30 min  Subjective  Subjective: "Are we making progress?"  Pain Score: Pain Score:   3  Wound Assessment  Pressure Ulcer 10/06/11 Stage II -  Partial thickness loss of dermis presenting as a shallow open ulcer with a red, pink wound bed without slough. Baseball sized reddened area on both sides of buttocks, non blanchable, skin irritation. (Active)  State of Healing Early/partial granulation 10/25/2011  9:00 AM  Site / Wound Assessment Pink;Red 10/25/2011  9:00 AM  % Wound base Red or Granulating 50% 10/25/2011  8:13 AM  % Wound base Yellow 50% 10/25/2011  8:13 AM  Peri-wound Assessment Induration 10/25/2011  9:00 AM  Wound Length (cm) 6 cm 10/24/2011 10:35 AM  Wound Width (cm) 4 cm 10/24/2011 10:35 AM  Wound Depth (cm) 0.2 cm 10/24/2011 10:35 AM  Drainage Amount Moderate 10/25/2011  9:00 AM  Drainage Description Serosanguineous;Odor 10/25/2011  9:00 AM  Treatment Hydrotherapy (Pulse lavage);Debridement (Selective) 10/24/2011 10:35 AM  Dressing Type ABD;Silver hydrofiber;Barrier Film (skin prep) 10/25/2011  9:00 AM  Dressing Changed 10/25/2011  9:00 AM     Wound 10/06/11 Other (Comment) Sacrum Mid (Active)     Incision 10/01/11 Chest Right;Upper (Active)  Site / Wound Assessment Clean;Dry 10/25/2011  8:13 AM  Incision Length (cm) 8 cm 10/06/2011  8:00 PM  Margins Attached edges (approximated) 10/24/2011  7:00 AM  Closure Skin glue 10/25/2011  8:13 AM  Drainage Amount None 10/25/2011  8:13 AM  Drainage Description No odor 10/24/2011  7:00 AM  Treatment Other (Comment) 10/23/2011  7:40 PM  Dressing Type None 10/25/2011  8:13 AM  Dressing Clean;Dry 10/19/2011 10:30 PM     Incision 10/01/11 Chest Right;Upper (Active)     Incision 10/01/11 Sternum Mid (Active)  Site / Wound Assessment Clean;Dry 10/25/2011  8:13 AM    Incision Length (cm) 18 cm 10/06/2011  8:00 PM  Margins Attached edges (approximated) 10/25/2011  8:13 AM  Closure Skin glue 10/25/2011  8:13 AM  Drainage Amount None 10/25/2011  8:13 AM  Drainage Description No odor 10/24/2011  7:00 AM  Treatment Cleansed;Other (Comment) 10/21/2011  8:00 PM  Dressing Type None 10/22/2011  7:00 AM  Dressing Intact;Clean;Dry 10/17/2011  9:31 AM     Incision 10/07/11 Chest Right;Lateral (Active)  Site / Wound Assessment Clean;Dry 10/25/2011  8:13 AM  Margins Attached edges (approximated) 10/25/2011  8:13 AM  Closure Adhesive strips 10/25/2011  8:13 AM  Drainage Amount None 10/24/2011  7:00 AM  Drainage Description No odor 10/24/2011  7:00 AM  Treatment Cleansed;Other (Comment) 10/21/2011  8:00 PM  Dressing Type None 10/25/2011  8:13 AM  Dressing Clean;Dry;Intact 10/17/2011  8:00 PM   Hydrotherapy Pulsed lavage therapy - wound location: lt. buttock, inner gluteal fold Pulsed Lavage with Suction (psi):  (4-8psi) Pulsed Lavage with Suction - Normal Saline Used: 1000 mL Pulsed Lavage Tip: Tip with splash shield Selective Debridement Selective Debridement - Location: lt. buttock Selective Debridement - Tools Used: Forceps;Scissors Selective Debridement - Tissue Removed: blistered skin   Wound Assessment and Plan  Wound Therapy - Assess/Plan/Recommendations Wound Therapy - Clinical Statement: Again with decreased drainage today. Still with slough in inner gluteal fold and less on L buttock.   Factors Delaying/Impairing Wound Healing: Immobility;Multiple medical problems;Polypharmacy Hydrotherapy Plan: Debridement;Dressing change;Patient/family education;Pulsatile lavage with suction Wound Therapy - Frequency: 6X / week Wound Therapy - Follow Up Recommendations:  Skilled nursing facility  Wound Therapy Goals- Improve the function of patient's integumentary system by progressing the wound(s) through the phases of wound healing (inflammation - proliferation -  remodeling) by: Decrease Necrotic Tissue to: 0% Decrease Necrotic Tissue - Progress: Progressing toward goal Increase Granulation Tissue to: 100% Increase Granulation Tissue - Progress: Progressing toward goal Improve Drainage Characteristics: Mod Improve Drainage Characteristics - Progress: Met Patient/Family will be able to : verbalize postioning Patient/Family Instruction Goal - Progress: Progressing toward goal Goals/treatment plan/discharge plan were made with and agreed upon by patient/family: Yes Time For Goal Achievement: 7 days Wound Therapy - Potential for Goals: Good  Goals will be updated until maximal potential achieved or discharge criteria met.  Discharge criteria: when goals achieved, discharge from hospital, MD decision/surgical intervention, no progress towards goals, refusal/missing three consecutive treatments without notification or medical reason.  [name] [name] M 10/25/2011, 9:15 AM  10/25/2011 Cephus Shelling, PT, DPT [phone]

## 2011-10-26 ENCOUNTER — Inpatient Hospital Stay (HOSPITAL_COMMUNITY): Payer: Medicare Other

## 2011-10-26 MED ORDER — WARFARIN SODIUM 2 MG PO TABS
2.0000 mg | ORAL_TABLET | Freq: Every day | ORAL | Status: DC
  Administered 2011-10-26 – 2011-10-29 (×4): 2 mg via ORAL
  Filled 2011-10-26 (×4): qty 1

## 2011-10-26 NOTE — Progress Notes (Addendum)
25 Days Post-Op Procedure(s) (LRB): MINIMALLY INVASIVE MITRAL VALVE REPAIR (MVR) (Right)  Subjective: Stable, no new complaints.  Objective: Vital signs in last 24 hours: Patient Vitals for the past 24 hrs:  BP Temp Temp src Pulse Resp SpO2 Weight  10/26/11 0637 113/66 mmHg 97.3 F (36.3 C) Oral 95  20  98 % 81.103 kg (178 lb 12.8 oz)  10/25/11 2045 129/79 mmHg 97 F (36.1 C) Oral 97  18  92 % -  10/25/11 1511 - - - - - 91 % -  10/25/11 1509 108/68 mmHg 97.7 F (36.5 C) Oral 87  18  89 % -   Current Weight  10/26/11 81.103 kg (178 lb 12.8 oz)     Intake/Output from previous day: 12/08 0701 - 12/09 0700 In: 480 [P.O.:480] Out: 3300 [Urine:3300]    PHYSICAL EXAM:  Heart: RRR Lungs: decreased BS in bases bilat Wound: clean and dry, right groin stable, granulating Extremities: + edema, looks to be improving  Lab Results: CBC: Basename 10/24/11 0610  WBC 10.8*  HGB 9.6*  HCT 31.1*  PLT 64*   BMET:  Basename 10/24/11 0610  NA 137  K 4.1  CL 101  CO2 29  GLUCOSE 121*  BUN 22  CREATININE 1.16  CALCIUM 8.2*    PT/INR:  Basename 10/26/11 0545  LABPROT 23.3*  INR 2.03*   Chest x-ray: Stable moderate left and small right pleural effusions  Assessment/Plan: S/P Procedure(s) (LRB): MINIMALLY INVASIVE MITRAL VALVE REPAIR (MVR) (Right) Continue local wound care to right groin and sacral decubitus. Vol overload/ pleural effusions- wt down 1-2 kg over past 24 hours, effusions stable on XR.  Continue diuresis.  May need to consider thoracentesis if no change.   Continue anticoagulation Possible tx SNF this week.   LOS: 25 days    COLLINS,GINA H 10/26/2011   pro BNP was 3300 c/w CHF, has been diuresing well the last couple of days, Left effusion looks a little smaller today, I suspect it will resolve as CHF treated. I wouldn't do thoracentesis at this point given he is on coumadin. Continue local wound care for R groin and sacral decub.

## 2011-10-27 ENCOUNTER — Inpatient Hospital Stay (HOSPITAL_COMMUNITY): Payer: Medicare Other

## 2011-10-27 ENCOUNTER — Ambulatory Visit: Payer: Self-pay | Admitting: Thoracic Surgery (Cardiothoracic Vascular Surgery)

## 2011-10-27 LAB — BASIC METABOLIC PANEL
BUN: 29 mg/dL — ABNORMAL HIGH (ref 6–23)
CO2: 37 mEq/L — ABNORMAL HIGH (ref 19–32)
Chloride: 91 mEq/L — ABNORMAL LOW (ref 96–112)
Creatinine, Ser: 1.2 mg/dL (ref 0.50–1.35)
GFR calc Af Amer: 65 mL/min — ABNORMAL LOW (ref >90)
Potassium: 2.8 mEq/L — ABNORMAL LOW (ref 3.5–5.1)

## 2011-10-27 LAB — PRO B NATRIURETIC PEPTIDE: Pro B Natriuretic peptide (BNP): 2493 pg/mL — ABNORMAL HIGH (ref 0–450)

## 2011-10-27 LAB — PROTIME-INR: INR: 1.68 — ABNORMAL HIGH (ref 0.00–1.49)

## 2011-10-27 MED ORDER — POTASSIUM CHLORIDE CRYS ER 20 MEQ PO TBCR
40.0000 meq | EXTENDED_RELEASE_TABLET | Freq: Three times a day (TID) | ORAL | Status: DC
  Administered 2011-10-27 – 2011-11-01 (×16): 40 meq via ORAL
  Filled 2011-10-27 (×16): qty 2

## 2011-10-27 MED ORDER — LISINOPRIL 10 MG PO TABS
10.0000 mg | ORAL_TABLET | Freq: Every day | ORAL | Status: DC
  Administered 2011-10-27: 10 mg via ORAL
  Filled 2011-10-27 (×2): qty 1

## 2011-10-27 MED ORDER — CIPROFLOXACIN HCL 500 MG PO TABS
500.0000 mg | ORAL_TABLET | Freq: Two times a day (BID) | ORAL | Status: DC
  Administered 2011-10-27 – 2011-11-01 (×11): 500 mg via ORAL
  Filled 2011-10-27 (×15): qty 1

## 2011-10-27 MED FILL — Coagulation Factor VIIa (Recomb) For Inj 1 MG (1000 MCG): INTRAVENOUS | Qty: 8 | Status: AC

## 2011-10-27 NOTE — Progress Notes (Signed)
Physical Therapy Wound Treatment Patient Details  Name: [name] [name] MRN: [id number] Date of Birth: 12-27-1930  Today's Date: 10/27/2011 Time: 5621-3086 Time Calculation (min): 27 min  Subjective  Subjective: "How does it look?"  Pain Score:    Wound Assessment  Pressure Ulcer 10/06/11 Stage II -  Partial thickness loss of dermis presenting as a shallow open ulcer with a red, pink wound bed without slough. Baseball sized reddened area on both sides of buttocks, non blanchable, skin irritation. (Active)  State of Healing Eschar 10/27/2011  9:00 AM  Site / Wound Assessment Black;Pink;Red;Yellow 10/27/2011  9:00 AM  % Wound base Red or Granulating 50% 10/27/2011  9:00 AM  % Wound base Yellow 50% 10/25/2011  8:13 AM  % Wound base Black 50% 10/27/2011  9:00 AM  Peri-wound Assessment Induration 10/27/2011  9:00 AM  Wound Length (cm) 6 cm 10/24/2011 10:35 AM  Wound Width (cm) 4 cm 10/24/2011 10:35 AM  Wound Depth (cm) 0.2 cm 10/24/2011 10:35 AM  Drainage Amount Minimal 10/27/2011  9:00 AM  Drainage Description Serosanguineous 10/27/2011  9:00 AM  Treatment Hydrotherapy (Pulse lavage) 10/27/2011  9:00 AM  Dressing Type Silver hydrofiber;Gauze (Comment);ABD;Barrier Film (skin prep) 10/27/2011  9:00 AM  Dressing Changed 10/27/2011  9:00 AM     Hydrotherapy Pulsed lavage therapy - wound location: lt. buttock, inner gluteal fold Pulsed Lavage with Suction (psi):  (4-8 psi) Pulsed Lavage with Suction - Normal Saline Used: 1000 mL Pulsed Lavage Tip: Tip with splash shield   Wound Assessment and Plan  Wound Therapy - Assess/Plan/Recommendations Wound Therapy - Clinical Statement: Today wound with 50 brown/black necrotic tissue that has not been present prior. Wound Therapy - Follow Up Recommendations: Skilled nursing facility  Wound Therapy Goals- Improve the function of patient's integumentary system by progressing the wound(s) through the phases of wound healing (inflammation -  proliferation - remodeling) by: Decrease Necrotic Tissue - Progress: Not met Increase Granulation Tissue - Progress: Not met Improve Drainage Characteristics - Progress: Progressing toward goal Patient/Family Instruction Goal - Progress: Progressing toward goal  Goals will be updated until maximal potential achieved or discharge criteria met.  Discharge criteria: when goals achieved, discharge from hospital, MD decision/surgical intervention, no progress towards goals, refusal/missing three consecutive treatments without notification or medical reason.  [name] [name] 10/27/2011, 9:19 AM [name] [name] PT [phone]

## 2011-10-27 NOTE — Progress Notes (Signed)
CSW advised SNF of pt disposition and will continue to follow.  Baxter Flattery, MSW [phone]

## 2011-10-27 NOTE — Progress Notes (Addendum)
26 Days Post-Op  Procedure(s) (LRB): MINIMALLY INVASIVE MITRAL VALVE REPAIR (MVR) (Right) Subjective: Conts to diurese well, feels less SOB  Objective  Telemetry NSR Some PAC's  Temp:  [97 F (36.1 C)-98 F (36.7 C)] 97.9 F (36.6 C) (12/10 0610) Pulse Rate:  [84-88] 84  (12/10 0610) Resp:  [20] 20  (12/10 0610) BP: (91-121)/(54-61) 91/61 mmHg (12/10 0610) SpO2:  [91 %-93 %] 91 % (12/10 0610) Weight:  [176 lb 11.2 oz (80.151 kg)] 176 lb 11.2 oz (80.151 kg) (12/10 0610)   Intake/Output Summary (Last 24 hours) at 10/27/11 0730 Last data filed at 10/26/11 2254  Gross per 24 hour  Intake    480 ml  Output   2800 ml  Net  -2320 ml       General appearance: alert, cooperative and no distress Heart: slightly irregular, no murmur , gallop, or rub Lungs: diminished in the bases Abdomen: hyperactive BS's, soft , nontender, mold distension Extremities: edema cont's to improve Wound: Incisions stable, decub not examined at this time.  Lab Results:  Basename 10/27/11 0500  NA 135  K 2.8*  CL 91*  CO2 37*  GLUCOSE 126*  BUN 29*  CREATININE 1.20  CALCIUM 8.5  MG --  PHOS --   No results found for this basename: AST:2,ALT:2,ALKPHOS:2,BILITOT:2,PROT:2,ALBUMIN:2 in the last 72 hours No results found for this basename: LIPASE:2,AMYLASE:2 in the last 72 hours No results found for this basename: WBC:2,NEUTROABS:2,HGB:2,HCT:2,MCV:2,PLT:2 in the last 72 hours No results found for this basename: CKTOTAL:4,CKMB:4,TROPONINI:4 in the last 72 hours  Basename 10/27/11 0500  POCBNP 2493.0*   No results found for this basename: DDIMER in the last 72 hours No results found for this basename: HGBA1C in the last 72 hours No results found for this basename: CHOL,HDL,LDLCALC,TRIG,CHOLHDL in the last 72 hours No results found for this basename: TSH,T4TOTAL,FREET3,T3FREE,THYROIDAB in the last 72 hours No results found for this basename: VITAMINB12,FOLATE,FERRITIN,TIBC,IRON,RETICCTPCT in the  last 72 hours  Medications: Scheduled    . amiodarone  200 mg Oral Daily  . cephALEXin  500 mg Oral Q12H  . cycloSPORINE  1 drop Both Eyes Q12H  . feeding supplement  1 Container Oral TID WC  . furosemide  80 mg Oral BID  . guaiFENesin  600 mg Oral BID  . metoprolol tartrate  25 mg Oral BID  . multivitamins ther. w/minerals  1 tablet Oral Daily  . nystatin  5 mL Mouth/Throat QID  . pantoprazole  40 mg Oral QAC breakfast  . potassium chloride  40 mEq Oral BID  . povidone-iodine  1 application Topical BID  . traZODone  150 mg Oral QHS  . vitamin C  500 mg Oral Daily  . warfarin  2 mg Oral q1800  . DISCONTD: warfarin  1 mg Oral q1800     Radiology/Studies:  Dg Chest 2 View  10/26/2011  *RADIOLOGY REPORT*  Clinical Data: SOB, follow up effusions  CHEST - 2 VIEW  Comparison: 10/24/2011  Findings: Moderate left and small right pleural effusions, unchanged.  Associated lower lobe atelectasis.  Chronic interstitial markings.  Mediastinal silhouette is unchanged.  Valve annuloplasty.  Stable left PICC with its tip in the right atrium.  IMPRESSION: Moderate left and small right pleural effusions, unchanged.  Original Report Authenticated By: Charline Bills, M.D.    INR: Will add last result for INR, ABG once components are confirmed Will add last 4 CBG results once components are confirmed  Assessment/Plan: S/P Procedure(s) (LRB): MINIMALLY INVASIVE MITRAL VALVE REPAIR (MVR) (  Right) 1. Making good progress with volume overload/CHF, BNP 2493 today, K+ 2.8, replace 2. Continues with wound care,was  on keflex, change to cipro with pseudomonas in decub wound 3.Possibly ready for SNF soon 4. INR 1.68 on 2 mg daily  LOS: 26 days    GOLD,WAYNE E 12/10/20127:30 AM  I have seen and examined the patient and agree with the assessment and plan as outlined.  The decubitus ulcerations have improved over the last week, especially on the right side.  The right groin wound is now open but  clean.  No orders for wound care in system.  Will order bid dressing changes.  Clinically he has made substantial progress, although chronic CHF has continued to slow progress.  Will proceed with therapeutic left thoracentesis for left pleural effusion. Check follow up ECHO Restart ACE-I and watch renal function. Continue diuretic at current dose. Possible d/c to SNF later this week.  [name],[name] 10/27/2011 8:29 AM

## 2011-10-27 NOTE — Procedures (Signed)
Left pleural effusion  US guided thorocentesis 500 cc blood tinged fluid obtained BP low at 85/50---stopped procedure although Fluid still evident  Fluid sent to lab per MD Pt tolerated well  cxr pending

## 2011-10-27 NOTE — Progress Notes (Signed)
PT Note  Treatment is cancelled today due to medical issues with patient which prohibited therapy. Patient in X-ray and then to thoracentesis.  Continue PT as able.  Thanks.  [facility] [phone] (pager)

## 2011-10-27 NOTE — Progress Notes (Signed)
CARDIAC REHAB PHASE I   PRE:  Rate/Rhythm: 95SR  BP:  Supine: 100/50  Sitting:   Standing:    SaO2: 90%RA  MODE:  Ambulation: 450 ft   POST:  Rate/Rhythem: 108  BP:  Supine:   Sitting: 120/70  Standing:    SaO2: 94%2L 1145-1220 Pt walked 450 ft on O2 at 2L with rolling walker and asst x 2. Followed with chair. Sat down once to rest. Tolerated well. To recliner with call bell.   Duanne Limerick

## 2011-10-27 NOTE — Clinical Documentation Improvement (Signed)
CHF DOCUMENTATION CLARIFICATION QUERY  THIS DOCUMENT IS NOT A PERMANENT PART OF THE MEDICAL RECORD  TO RESPOND TO THE THIS QUERY, FOLLOW THE INSTRUCTIONS BELOW:  1. If needed, update documentation for the patient's encounter via the notes activity.  2. Access this query again and click edit on the [employer].  3. After updating, or not, click F2 to complete all highlighted (required) fields concerning your review. Select "additional documentation in the medical record" OR "no additional documentation provided".  4. Click Sign note button.  5. The deficiency will fall out of your InBasket *Please let us know if you are not able to compete this workflow by phone or e-mail (listed below).  Please update your documentation within the medical record to reflect your response to this query.                                                                                    10/27/11  Dear Dr.[facility],  In a better effort to capture your patient's severity of illness, reflect appropriate length of stay and utilization of resources, a review of the patient medical record has revealed the following indicators the diagnosis of Heart Failure.    Based on your clinical judgment, please clarify and document in a progress note and/or discharge summary the clinical condition associated with the following supporting information:   Possible Clinical Conditions?   Chronic Systolic Congestive Heart Failure Chronic Diastolic Congestive Heart Failure Chronic Systolic & Diastolic Congestive Heart Failure Acute Systolic Congestive Heart Failure Acute Diastolic Congestive Heart Failure Acute Systolic & Diastolic Congestive Heart Failure Acute on Chronic Systolic Congestive Heart Failure Acute on Chronic Diastolic Congestive Heart Failure Acute on Chronic Systolic & Diastolic  Congestive Heart Failure Other Condition________________________________________ Cannot Clinically  Determine  Supporting Information:  Per 12/10 progress note:  Volume overload/CHF, BNP 2493 today.  Please specify:   Acute vs Chronic and Systolic vs Diastolic in the progress notes.   Reviewed:  no additional documentation provided  Thank You,  Heywood Footman, BSN,  Clinical Documentation Specialist:  Pager: [phone]  [facility] [employer] [facility] [place]

## 2011-10-28 ENCOUNTER — Inpatient Hospital Stay (HOSPITAL_COMMUNITY): Payer: Medicare Other

## 2011-10-28 DIAGNOSIS — I4891 Unspecified atrial fibrillation: Secondary | ICD-10-CM

## 2011-10-28 DIAGNOSIS — I059 Rheumatic mitral valve disease, unspecified: Secondary | ICD-10-CM

## 2011-10-28 DIAGNOSIS — L8990 Pressure ulcer of unspecified site, unspecified stage: Secondary | ICD-10-CM | POA: Diagnosis not present

## 2011-10-28 DIAGNOSIS — I509 Heart failure, unspecified: Secondary | ICD-10-CM

## 2011-10-28 LAB — PROTIME-INR
INR: 1.8 — ABNORMAL HIGH (ref 0.00–1.49)
Prothrombin Time: 21.2 seconds — ABNORMAL HIGH (ref 11.6–15.2)

## 2011-10-28 LAB — BASIC METABOLIC PANEL
CO2: 36 mEq/L — ABNORMAL HIGH (ref 19–32)
Chloride: 96 mEq/L (ref 96–112)
GFR calc Af Amer: 56 mL/min — ABNORMAL LOW (ref >90)
Potassium: 3.5 mEq/L (ref 3.5–5.1)
Sodium: 138 mEq/L (ref 135–145)

## 2011-10-28 MED ORDER — COLLAGENASE 250 UNIT/GM EX OINT
TOPICAL_OINTMENT | Freq: Every day | CUTANEOUS | Status: DC
  Administered 2011-10-28 – 2011-11-01 (×5): via TOPICAL
  Filled 2011-10-28: qty 30

## 2011-10-28 MED ORDER — LISINOPRIL 2.5 MG PO TABS
2.5000 mg | ORAL_TABLET | Freq: Every day | ORAL | Status: DC
  Filled 2011-10-28: qty 1

## 2011-10-28 MED ORDER — FUROSEMIDE 40 MG PO TABS
40.0000 mg | ORAL_TABLET | Freq: Two times a day (BID) | ORAL | Status: DC
  Administered 2011-10-28: 40 mg via ORAL
  Administered 2011-10-28: 18:00:00 via ORAL
  Administered 2011-10-29 – 2011-11-01 (×7): 40 mg via ORAL
  Filled 2011-10-28 (×12): qty 1

## 2011-10-28 NOTE — Progress Notes (Addendum)
27 Days Post-Op  Procedure(s) (LRB): MINIMALLY INVASIVE MITRAL VALVE REPAIR (MVR) (Right) Subjective: Dry cough, c/o constipation. Generally feels better.  Objective  Telemetry NSR, PVC's  Temp:  [97.5 F (36.4 C)-97.7 F (36.5 C)] 97.7 F (36.5 C) (12/11 0438) Pulse Rate:  [82-87] 82  (12/11 0438) Resp:  [20] 20  (12/11 0438) BP: (79-94)/(37-57) 90/52 mmHg (12/11 0438) SpO2:  [80 %-96 %] 90 % (12/11 0438) Weight:  [177 lb (80.287 kg)] 177 lb (80.287 kg) (12/11 0438)   Intake/Output Summary (Last 24 hours) at 10/28/11 0737 Last data filed at 10/28/11 0121  Gross per 24 hour  Intake   1080 ml  Output    825 ml  Net    255 ml       General appearance: alert, cooperative and no distress Heart: S1, S2 normal Lungs: diminished in bases, improved Abdomen: soft, non tender, non distended, + BS Extremities: edema cont  to improve Wound: stable thigh, incisions well healed, decub not checked at this time  Lab Results:  Basename 10/27/11 0500  NA 135  K 2.8*  CL 91*  CO2 37*  GLUCOSE 126*  BUN 29*  CREATININE 1.20  CALCIUM 8.5  MG --  PHOS --   No results found for this basename: AST:2,ALT:2,ALKPHOS:2,BILITOT:2,PROT:2,ALBUMIN:2 in the last 72 hours No results found for this basename: LIPASE:2,AMYLASE:2 in the last 72 hours No results found for this basename: WBC:2,NEUTROABS:2,HGB:2,HCT:2,MCV:2,PLT:2 in the last 72 hours No results found for this basename: CKTOTAL:4,CKMB:4,TROPONINI:4 in the last 72 hours No components found with this basename: POCBNP:3 No results found for this basename: DDIMER in the last 72 hours No results found for this basename: HGBA1C in the last 72 hours No results found for this basename: CHOL,HDL,LDLCALC,TRIG,CHOLHDL in the last 72 hours No results found for this basename: TSH,T4TOTAL,FREET3,T3FREE,THYROIDAB in the last 72 hours No results found for this basename: VITAMINB12,FOLATE,FERRITIN,TIBC,IRON,RETICCTPCT in the last 72  hours  Medications: Scheduled    . amiodarone  200 mg Oral Daily  . ciprofloxacin  500 mg Oral BID  . cycloSPORINE  1 drop Both Eyes Q12H  . feeding supplement  1 Container Oral TID WC  . furosemide  40 mg Oral BID  . guaiFENesin  600 mg Oral BID  . lisinopril  2.5 mg Oral Daily  . metoprolol tartrate  25 mg Oral BID  . multivitamins ther. w/minerals  1 tablet Oral Daily  . pantoprazole  40 mg Oral QAC breakfast  . potassium chloride  40 mEq Oral TID  . povidone-iodine  1 application Topical BID  . traZODone  150 mg Oral QHS  . vitamin C  500 mg Oral Daily  . warfarin  2 mg Oral q1800  . DISCONTD: furosemide  80 mg Oral BID  . DISCONTD: lisinopril  10 mg Oral Daily  . DISCONTD: nystatin  5 mL Mouth/Throat QID  . DISCONTD: potassium chloride  40 mEq Oral BID     Radiology/Studies:  Dg Chest 1 View  10/27/2011  *RADIOLOGY REPORT*  Clinical Data: Post left thoracentesis.  CHEST - 1 VIEW  Comparison: 10/26/2011 and 12/24/2010.  Findings: Decrease in size of left-sided pleural effusion post thoracentesis.  The curvilinear structures projecting over the posterior left second rib were present on the 10/24/2011 exam and felt to be unrelated to a pneumothorax.  Overall, no post procedure pneumothorax is noted.  Bilateral pleural effusions remain with basilar atelectasis suspected.  Post valve replacement.  Cardiomegaly.  Left central line tip proximal right atrium.  To  be within the distal superior vena cava, this can be retracted by 3.8 cm.  Pulmonary vascular prominence most notable centrally.  IMPRESSION: Decrease in size of left-sided pleural effusion post thoracentesis. No left-sided pneumothorax noted as discussed above.  Left central line tip proximal right atrium.  To be within the distal superior vena cava, this can be retracted by 3.8 cm.  Pulmonary vascular congestion, pleural effusions and basilar atelectasis remaining.  Cardiomegaly.  Original Report Authenticated By: Fuller Canada, M.D.   US Thoracentesis  10/27/2011  *RADIOLOGY REPORT*  Clinical Data:  Left pleural effusion  ULTRASOUND GUIDED left THORACENTESIS  Comparison:  None  An ultrasound guided thoracentesis was thoroughly discussed with the patient and questions answered.  The benefits, risks, alternatives and complications were also discussed.  The patient understands and wishes to proceed with the procedure.  Written consent was obtained.  Ultrasound was performed to localize and mark an adequate pocket of fluid in the left chest.  The area was then prepped and draped in the normal sterile fashion.  1% Lidocaine was used for local anesthesia.  Under ultrasound guidance a 19 gauge Yueh catheter was introduced.  Thoracentesis was performed.  The catheter was removed and a dressing applied.  Complications:  None  Findings: A total of approximately 500 ml of blood-tinged fluid was removed. A fluid sample was sent for laboratory analysis.  IMPRESSION: Successful ultrasound guided  left  thoracentesis yielding 500 ml of pleural fluid. The patient blood pressure was low with systolic 91 prior to procedure.  Blood pressure dropped to systolic 85 after 500 ml withdrawn.  Thoracentesis was stopped at that point.   Did have events of systolic as low as 80 and as high as 94.  The patient  denies pain although did complain of feeling tired.  Back in room now with 2 liters of oxygen by nasal cannula.  Read by: Ralene Muskrat, P.A.-C  Original Report Authenticated By: Waynard Reeds, M.D.    INR: 1.80 Will add last result for INR, ABG once components are confirmed Will add last 4 CBG results once components are confirmed  Assessment/Plan: S/P Procedure(s) (LRB): MINIMALLY INVASIVE MITRAL VALVE REPAIR (MVR) (Right) 1. Improving 2. Cont diuresis, check echo result 3. Dulcolax supp today 4. F/u labs 5. conts wound care 6. May have to stop ace w/ low relative BP, observe  Wayne Gold PA-C   LOS: 27 days    GOLD,WAYNE  E 12/11/20127:37 AM   I have seen and examined the patient and agree with the assessment and plan as outlined.  ECHO still pending, reason unclear.  Lasix dose decreased since BP decreased, orthostatic yesterday.  D/C lisinopril for now.  He will need to be more stable clinically before we can reconsider d/c to SNF.  Discussed issues at length with patient and his daughter.  All questions answered.  Celine Dishman H 10/28/2011 5:50 PM

## 2011-10-28 NOTE — Progress Notes (Signed)
   CARE MANAGEMENT NOTE 10/28/2011  Patient:  [name] [name], [name] [name]   Account Number:  [id number]  Date Initiated:  10/02/2011  Documentation initiated by:  [facility]  Subjective/Objective Assessment:   Post op cardiac valve surgery.     Action/Plan:   PTA, PT INDEPENDENT, LIVES ALONE.  HAS SUPPORTIVE DAUGHTER IN [place].   Anticipated DC Date:  10/31/2011   Anticipated DC Plan:  SKILLED NURSING FACILITY  In-house referral  Clinical Social Worker      DC Planning Services  CM consult      Choice offered to / List presented to:             Status of service:  In process, will continue to follow Medicare Important Message given?   (If response is "NO", the following Medicare IM given date fields will be blank) Date Medicare IM given:   Date Additional Medicare IM given:    Discharge Disposition:    Per UR Regulation:  Reviewed for med. necessity/level of care/duration of stay  Comments:  10/28/11 Storm Dulski,RN,BSN 1151 PT WITH CHF, DIURESING; THORACENTESIS YESTERDAY.  SACRAL WOUND IMPROVING, PER WOUND OSTOMY NURSE NOTES.  HOPEFUL FOR DISCHARGE TO SNF LATER IN THE WEEK ONCE MEDICAL ISSUES IMPROVED. Phone #[phone]  10-28-11 8:45am Avie Arenas, RNBSN - [phone] UR Completed.  10/23/11 Judy Pollman,RN,BSN PT WITH SLOW PROGRESS, SACRAL WOUND WITH HYDROTHERAPY.  PT TO DISCHARGE TO [facility] WHEN MEDICALLY STABLE, PER CSW ARRANGEMENTS. Phone #[phone]  10-22-11 11am Avie Arenas, RNBSN - [phone] UR completed.  Case Management:   Noted pt. Will be discharging to SNF. NCM will continue to follow for any further discharge needs. Tera Mater, RN, BSN Case Manager # [phone]  10-15-11 9:50am Avie Arenas, RNBSN [phone] UR Completed.  10-10-11 9am Avie Arenas, RNBSN [phone] UR Completed.  10-07-11 12noon Johny Shears - [phone] UR completed.  Moved back toICU.  Increased sub q air - new pneumo - CTplaced.  10-06-11  11:30am Johny Shears - [phone] UR Completed.  10-02-11 2pm Avie Arenas, RNBSN [phone] UR Completed.

## 2011-10-28 NOTE — Progress Notes (Signed)
Physical Therapy Wound Treatment Patient Details  Name: [name] [name] MRN: [id number] Date of Birth: October 01, 1931  Today's Date: 10/28/2011 Time: 1140-1200 Time Calculation (min): 20 min  Subjective  Subjective: "It feels numb," pt states of wound.  Pain Score:    Wound Assessment  Pressure Ulcer 10/06/11 Stage II -  Partial thickness loss of dermis presenting as a shallow open ulcer with a red, pink wound bed without slough. Baseball sized reddened area on both sides of buttocks, non blanchable, skin irritation. (Active)  State of Healing Eschar 10/28/2011 12:13 PM  Site / Wound Assessment Black;Brown;Pink;Red;Yellow 10/28/2011 12:13 PM  % Wound base Red or Granulating 50% 10/28/2011 12:13 PM  % Wound base Yellow 50% 10/25/2011  8:13 AM  % Wound base Black 50% 10/28/2011 12:13 PM  Peri-wound Assessment Induration 10/28/2011 12:13 PM  Wound Length (cm) 6 cm 10/24/2011 10:35 AM  Wound Width (cm) 4 cm 10/24/2011 10:35 AM  Wound Depth (cm) 0.2 cm 10/24/2011 10:35 AM  Drainage Amount Moderate 10/28/2011 12:13 PM  Drainage Description Other (Comment) 10/28/2011 12:13 PM  Treatment Hydrotherapy (Pulse lavage);Debridement (Selective) 10/28/2011 12:13 PM  Dressing Type Moist to dry;Barrier Film (skin prep);ABD 10/28/2011 12:13 PM  Dressing Changed 10/28/2011 12:13 PM     Hydrotherapy Pulsed lavage therapy - wound location: lt. buttock, inner gluteal fold Pulsed Lavage with Suction (psi):  (4-12 psi) Pulsed Lavage with Suction - Normal Saline Used: 1000 mL Pulsed Lavage Tip: Tip with splash shield Selective Debridement Selective Debridement - Location: lt. buttock Selective Debridement - Tools Used: Scalpel;Forceps (scored eschar) Selective Debridement - Tissue Removed: scored eschar   Wound Assessment and Plan  Wound Therapy - Assess/Plan/Recommendations Wound Therapy - Clinical Statement: Wound continues to change unpredictably.  Have resumed santyl for new eschar. Wound Therapy -  Frequency: 6X / week Wound Therapy - Follow Up Recommendations: Skilled nursing facility  Wound Therapy Goals- Improve the function of patient's integumentary system by progressing the wound(s) through the phases of wound healing (inflammation - proliferation - remodeling) by: Decrease Necrotic Tissue - Progress: Not met Increase Granulation Tissue - Progress: Not met Improve Drainage Characteristics - Progress: Progressing toward goal Patient/Family Instruction Goal - Progress: Progressing toward goal  Goals will be updated until maximal potential achieved or discharge criteria met.  Discharge criteria: when goals achieved, discharge from hospital, MD decision/surgical intervention, no progress towards goals, refusal/missing three consecutive treatments without notification or medical reason.  [name] 10/28/2011, 12:19 PM [employer] PT [phone]

## 2011-10-28 NOTE — Progress Notes (Signed)
  Echocardiogram 2D Echocardiogram has been performed.  [name] [name] 10/28/2011, 5:05 PM

## 2011-10-28 NOTE — Consult Note (Signed)
Wound care follow-up:  Previous deep tissue injury to left buttock continues to evolve and manifest in different ways.  Last week, patient began having large amount of green drainage with strong odor, despite continued hydrotherapy treatments. Aquacel was initiated at that time to provide antimicrobial benefits and absorb drainage. This recent area of dark purple has evolved into 5X2cm dry eschar. No further green drainage, but mod brown drainage from sites.  Right buttock .2X.2X.1cm pink-yellow without drainage, skin tag remains intact to gluteal fold.  Plan:  Optimal plan of care to promote healing would be to continue hydrotherapy at SNF after d/c from hospital.  This treatment is not available as outpatient.  Will resume santyl for chemical debridement and physical therapy will continue to attempt to remove nonviable tissue with hydrotherapy.  Pt has air mattress to decrease pressure, pressure reducing cushion when in chair, has not been sitting for prolonged periods of time, and appears to be receiving adequate nutrition. Systemic factors appear to be impairing healing in some manner.  Cammie Mcgee, RN, MSN, [employer]  [phone]

## 2011-10-28 NOTE — Progress Notes (Signed)
CARDIAC REHAB PHASE I   PRE:  Rate/Rhythm: 84 SR    BP: sitting 96/60    SaO2: 95 2 1/2 L, 96-98 RA  MODE:  Ambulation: 200 ft   POST:  Rate/Rhythm: 91    BP:  to BSC     SaO2: 96 RA  Pt anxious to walk, stated he had not walked all day. Tolerated well, assist x2 with RW, until pt felt he had to have BM and passed gas. Promptly sat in chair. Pushed pt back to room to [facility] however pt had not had any results. To recliner. Pt frustrated and wants to walk more. Asked NT to walk with him this pm. VSS, SaO2 good without O2. 1430-1510  Harriet Masson CES, ACSM

## 2011-10-28 NOTE — Progress Notes (Signed)
Subjective:  Patient has developed a dry cough. Had thorocentesis yesterday. Rhythm NSR with occ PVCs BP remains low.  Objective:  Vital Signs in the last 24 hours: Temp:  [97.5 F (36.4 C)-97.7 F (36.5 C)] 97.7 F (36.5 C) (12/11 0438) Pulse Rate:  [82-87] 82  (12/11 0438) Resp:  [20] 20  (12/11 0438) BP: (79-94)/(37-57) 90/52 mmHg (12/11 0438) SpO2:  [80 %-96 %] 90 % (12/11 0438) Weight:  [177 lb (80.287 kg)] 177 lb (80.287 kg) (12/11 0438)  Intake/Output from previous day: 12/10 0701 - 12/11 0700 In: 1080 [P.O.:1080] Out: 825 [Urine:825] Intake/Output from this shift:       . amiodarone  200 mg Oral Daily  . ciprofloxacin  500 mg Oral BID  . cycloSPORINE  1 drop Both Eyes Q12H  . feeding supplement  1 Container Oral TID WC  . furosemide  40 mg Oral BID  . guaiFENesin  600 mg Oral BID  . lisinopril  2.5 mg Oral Daily  . metoprolol tartrate  25 mg Oral BID  . multivitamins ther. w/minerals  1 tablet Oral Daily  . pantoprazole  40 mg Oral QAC breakfast  . potassium chloride  40 mEq Oral TID  . povidone-iodine  1 application Topical BID  . traZODone  150 mg Oral QHS  . vitamin C  500 mg Oral Daily  . warfarin  2 mg Oral q1800  . DISCONTD: furosemide  80 mg Oral BID  . DISCONTD: lisinopril  10 mg Oral Daily  . DISCONTD: nystatin  5 mL Mouth/Throat QID      Physical Exam: The patient appears to be in no distress.  Head and neck exam reveals that the pupils are equal and reactive.  The extraocular movements are full.  There is no scleral icterus.  Mouth and pharynx are benign.  No lymphadenopathy.  No carotid bruits.  The jugular venous pressure is normal.  Thyroid is not enlarged or tender.  Chest is clear to percussion and auscultation.  No rales or rhonchi.  Expansion of the chest is symmetrical.  Heart reveals no abnormal lift or heave.  First and second heart sounds are normal.  There is no murmur gallop rub or click.  The abdomen is soft and nontender.   Bowel sounds are normoactive.  There is no hepatosplenomegaly or mass.  There are no abdominal bruits.  Extremities reveal no phlebitis or edema.  Pedal pulses are good.  There is no cyanosis or clubbing.  Neurologic exam is normal strength and no lateralizing weakness.  No sensory deficits.  Integument reveals no rash  Lab Results: No results found for this basename: WBC:2,HGB:2,PLT:2 in the last 72 hours  Basename 10/28/11 0456 10/27/11 0500  NA 138 135  K 3.5 2.8*  CL 96 91*  CO2 36* 37*  GLUCOSE 114* 126*  BUN 36* 29*  CREATININE 1.36* 1.20   No results found for this basename: TROPONINI:2,CK,MB:2 in the last 72 hours Hepatic Function Panel No results found for this basename: PROT,ALBUMIN,AST,ALT,ALKPHOS,BILITOT,BILIDIR,IBILI in the last 72 hours No results found for this basename: CHOL in the last 72 hours No results found for this basename: PROTIME in the last 72 hours  Imaging: Imaging results have been reviewed  Cardiac Studies:  Assessment/Plan:  Patient Active Problem List  Diagnoses  . TINEA PEDIS  . ADENOCARCINOMA, PROSTATE  . HYPERCHOLESTEROLEMIA  . THROMBOCYTOPENIA  . DEPRESSION  . HYPERTENSION, BENIGN ESSENTIAL  . GERD  . DERMATITIS, ATOPIC  . CALLUS, LEFT FOOT  .  OSTEOARTHRITIS, GENERALIZED, MULTIPLE JOINTS  . BACK PAIN, CHRONIC  . MUSCLE SPASM, BACK  . PERSONAL HISTORY MALIGNANT NEOPLASM PROSTATE  . PERSONAL HISTORY OF MALIGNANT MELANOMA OF SKIN  . ARRHYTHMIA, HX OF  . PERSONAL HISTORY OF COLONIC POLYPS  . Cerumen impaction  . Hearing loss  . Valvular heart disease  . S/P mitral valve repair  . Atrial fibrillation  . Pneumothorax, postoperative  Echo not yet done. With low BP and cough will stop lisinopril now and consider restarting later. With significant weight loss may also have to cut back on Lasix.     LOS: 27 days    Cassell Clement 10/28/2011, 8:13 AM

## 2011-10-28 NOTE — Progress Notes (Signed)
R groin dehisced site changed twice today per MD order.  Packed wet to dry with gauze.  Red wound base, no ordor or drainage. Ave Filter

## 2011-10-29 DIAGNOSIS — I4891 Unspecified atrial fibrillation: Secondary | ICD-10-CM

## 2011-10-29 LAB — PROTIME-INR
INR: 1.73 — ABNORMAL HIGH (ref 0.00–1.49)
Prothrombin Time: 20.6 seconds — ABNORMAL HIGH (ref 11.6–15.2)

## 2011-10-29 LAB — BASIC METABOLIC PANEL
BUN: 32 mg/dL — ABNORMAL HIGH (ref 6–23)
Chloride: 98 mEq/L (ref 96–112)
GFR calc Af Amer: 63 mL/min — ABNORMAL LOW (ref >90)
GFR calc non Af Amer: 54 mL/min — ABNORMAL LOW (ref >90)
Potassium: 4.3 mEq/L (ref 3.5–5.1)
Sodium: 139 mEq/L (ref 135–145)

## 2011-10-29 MED ORDER — WARFARIN SODIUM 2.5 MG PO TABS
2.5000 mg | ORAL_TABLET | Freq: Every day | ORAL | Status: DC
  Administered 2011-10-30 – 2011-10-31 (×2): 2.5 mg via ORAL
  Filled 2011-10-29 (×3): qty 1

## 2011-10-29 NOTE — Progress Notes (Signed)
Pt ambulated 150 feet on rm air, pt tolerated activity well and is now resting in the bed.   [name] [name]

## 2011-10-29 NOTE — Progress Notes (Signed)
Physical Therapy Wound Treatment Patient Details  Name: [name] [name] MRN: [id number] Date of Birth: 06-17-1931  Today's Date: 10/29/2011 Time:  0902-0921    Subjective  Subjective: "Are we done?" pt asked after treatment.  Pain Score: Pain Score: 0-No pain  Wound Assessment  Pressure Ulcer 10/06/11 Stage II -  Partial thickness loss of dermis presenting as a shallow open ulcer with a red, pink wound bed without slough. Baseball sized reddened area on both sides of buttocks, non blanchable, skin irritation. (Active)  State of Healing Eschar 10/29/2011 11:25 AM  Site / Wound Assessment Black;Brown;Pink;Red 10/29/2011 11:25 AM  % Wound base Red or Granulating 50% 10/29/2011 11:25 AM  % Wound base Yellow 50% 10/25/2011  8:13 AM  % Wound base Black 50% 10/29/2011 11:25 AM  Peri-wound Assessment Induration 10/29/2011 11:25 AM  Wound Length (cm) 6 cm 10/24/2011 10:35 AM  Wound Width (cm) 4 cm 10/24/2011 10:35 AM  Wound Depth (cm) 0.2 cm 10/24/2011 10:35 AM  Drainage Amount Minimal 10/29/2011 11:25 AM  Drainage Description Other (Comment) 10/29/2011 11:25 AM  Treatment Hydrotherapy (Pulse lavage);Debridement (Selective) 10/29/2011 11:25 AM  Dressing Type Moist to dry;Barrier Film (skin prep);ABD 10/29/2011 11:25 AM  Dressing Changed 10/29/2011 11:25 AM     Hydrotherapy Pulsed lavage therapy - wound location: lt. buttock, inner gluteal fold Pulsed Lavage with Suction (psi):  (8-12) Pulsed Lavage with Suction - Normal Saline Used: 1000 mL Pulsed Lavage Tip: Tip with splash shield Selective Debridement Selective Debridement - Location: lt. buttock Selective Debridement - Tools Used: Other (comment) (4x4's)   Wound Assessment and Plan  Wound Therapy - Assess/Plan/Recommendations Wound Therapy - Clinical Statement: Wound stable today. Wound Therapy - Follow Up Recommendations: Skilled nursing facility  Wound Therapy Goals- Improve the function of patient's integumentary system by  progressing the wound(s) through the phases of wound healing (inflammation - proliferation - remodeling) by: Decrease Necrotic Tissue - Progress: Progressing toward goal Increase Granulation Tissue - Progress: Progressing toward goal Improve Drainage Characteristics - Progress: Progressing toward goal Patient/Family Instruction Goal - Progress: Progressing toward goal  Goals will be updated until maximal potential achieved or discharge criteria met.  Discharge criteria: when goals achieved, discharge from hospital, MD decision/surgical intervention, no progress towards goals, refusal/missing three consecutive treatments without notification or medical reason.  Iver Miklas 10/29/2011, 11:37 AM [name] PT [phone]

## 2011-10-29 NOTE — Progress Notes (Signed)
Discussed in the long length of stay meeting Beryl Hornberger Weeks 10/29/2011  

## 2011-10-29 NOTE — Progress Notes (Signed)
Subjective:  Feels a little better this am Rhythm NSR with PACs  Objective:  Vital Signs in the last 24 hours: Temp:  [97.4 F (36.3 C)-98.3 F (36.8 C)] 98.3 F (36.8 C) (12/12 0713) Pulse Rate:  [70-95] 79  (12/12 0713) Resp:  [20] 20  (12/12 0713) BP: (100-111)/(59-69) 108/61 mmHg (12/12 0713) SpO2:  [93 %-97 %] 93 % (12/12 0713) Weight:  [176 lb 11.2 oz (80.151 kg)] 176 lb 11.2 oz (80.151 kg) (12/12 0713)  Intake/Output from previous day: 12/11 0701 - 12/12 0700 In: 360 [P.O.:360] Out: 925 [Urine:925] Intake/Output from this shift:       . amiodarone  200 mg Oral Daily  . ciprofloxacin  500 mg Oral BID  . collagenase   Topical Daily  . cycloSPORINE  1 drop Both Eyes Q12H  . feeding supplement  1 Container Oral TID WC  . furosemide  40 mg Oral BID  . guaiFENesin  600 mg Oral BID  . metoprolol tartrate  25 mg Oral BID  . multivitamins ther. w/minerals  1 tablet Oral Daily  . pantoprazole  40 mg Oral QAC breakfast  . potassium chloride  40 mEq Oral TID  . povidone-iodine  1 application Topical BID  . traZODone  150 mg Oral QHS  . vitamin C  500 mg Oral Daily  . warfarin  2 mg Oral q1800      Physical Exam: The patient appears to be in no distress.  Head and neck exam reveals that the pupils are equal and reactive.  The extraocular movements are full.  There is no scleral icterus.  Mouth and pharynx are benign.  No lymphadenopathy.  No carotid bruits.  The jugular venous pressure is normal.  Thyroid is not enlarged or tender.  Chest is clear to percussion and auscultation.  No rales or rhonchi.  Expansion of the chest is symmetrical.  Heart reveals no abnormal lift or heave.  First and second heart sounds are normal.  There is no murmur gallop rub or click.  The abdomen is soft and nontender.  Bowel sounds are normoactive.  There is no hepatosplenomegaly or mass.  There are no abdominal bruits.  Extremities reveal no phlebitis or edema.  Pedal pulses are good.   There is no cyanosis or clubbing.  Neurologic exam is normal strength and no lateralizing weakness.  No sensory deficits.  Integument reveals no rash  Lab Results: No results found for this basename: WBC:2,HGB:2,PLT:2 in the last 72 hours  Basename 10/29/11 0505 10/28/11 0456  NA 139 138  K 4.3 3.5  CL 98 96  CO2 35* 36*  GLUCOSE 111* 114*  BUN 32* 36*  CREATININE 1.23 1.36*   No results found for this basename: TROPONINI:2,CK,MB:2 in the last 72 hours Hepatic Function Panel No results found for this basename: PROT,ALBUMIN,AST,ALT,ALKPHOS,BILITOT,BILIDIR,IBILI in the last 72 hours No results found for this basename: CHOL in the last 72 hours No results found for this basename: PROTIME in the last 72 hours  Imaging: Imaging results have been reviewed. Chest xray yesterday stable.  Cardiac Studies: Echo shows EF vigorous 65-70 %.  Mild MS. NO MR Assessment/Plan:  Patient Active Problem List  Diagnoses  . TINEA PEDIS  . ADENOCARCINOMA, PROSTATE  . HYPERCHOLESTEROLEMIA  . THROMBOCYTOPENIA  . DEPRESSION  . HYPERTENSION, BENIGN ESSENTIAL  . GERD  . DERMATITIS, ATOPIC  . CALLUS, LEFT FOOT  . OSTEOARTHRITIS, GENERALIZED, MULTIPLE JOINTS  . BACK PAIN, CHRONIC  . MUSCLE SPASM, BACK  . PERSONAL  HISTORY MALIGNANT NEOPLASM PROSTATE  . PERSONAL HISTORY OF MALIGNANT MELANOMA OF SKIN  . ARRHYTHMIA, HX OF  . PERSONAL HISTORY OF COLONIC POLYPS  . Cerumen impaction  . Hearing loss  . Valvular heart disease  . S/P mitral valve repair  . Atrial fibrillation  . Pneumothorax, postoperative  . Decubitus skin ulcer  . Pleural effusion due to congestive heart failure  Plan: Continue present meds.  Renal function slightly better.  Weight same.       LOS: 28 days    Cassell Clement 10/29/2011, 8:27 AM

## 2011-10-29 NOTE — Progress Notes (Signed)
Right groin incision packed with wet gauze per order and covered with dry dressing.  Wound base red, odor present, drainage serosang. Present on dressing.  Pt tolerated well. Nickolas Madrid

## 2011-10-29 NOTE — Progress Notes (Addendum)
28 Days Post-Op  Procedure(s) (LRB): MINIMALLY INVASIVE MITRAL VALVE REPAIR (MVR) (Right) Subjective: Getting physical therapy wound care currently, feels ok  Objective  Telemetry NSR PVC's   Temp:  [97.4 F (36.3 C)-98.3 F (36.8 C)] 98.3 F (36.8 C) (12/12 0713) Pulse Rate:  [70-95] 79  (12/12 0713) Resp:  [20] 20  (12/12 0713) BP: (100-111)/(59-69) 108/61 mmHg (12/12 0713) SpO2:  [93 %-97 %] 93 % (12/12 0713) Weight:  [176 lb 11.2 oz (80.151 kg)] 176 lb 11.2 oz (80.151 kg) (12/12 0713)   Intake/Output Summary (Last 24 hours) at 10/29/11 0925 Last data filed at 10/28/11 2038  Gross per 24 hour  Intake      0 ml  Output    700 ml  Net   -700 ml       General appearance: alert, cooperative and no distress Heart: regular rate and rhythm and occas. extrasystole Lungs: fair air exchange, diminished in the bases Abdomen: soft, non-tender; bowel sounds normal; no masses,  no organomegaly Extremities: cont to have less LE edema Wound: Decub with area of necrotic eschar, stable appearance   Lab Results:  Basename 10/29/11 0505 10/28/11 0456  NA 139 138  K 4.3 3.5  CL 98 96  CO2 35* 36*  GLUCOSE 111* 114*  BUN 32* 36*  CREATININE 1.23 1.36*  CALCIUM 8.8 8.5  MG -- --  PHOS -- --   No results found for this basename: AST:2,ALT:2,ALKPHOS:2,BILITOT:2,PROT:2,ALBUMIN:2 in the last 72 hours No results found for this basename: LIPASE:2,AMYLASE:2 in the last 72 hours No results found for this basename: WBC:2,NEUTROABS:2,HGB:2,HCT:2,MCV:2,PLT:2 in the last 72 hours No results found for this basename: CKTOTAL:4,CKMB:4,TROPONINI:4 in the last 72 hours No components found with this basename: POCBNP:3 No results found for this basename: DDIMER in the last 72 hours No results found for this basename: HGBA1C in the last 72 hours No results found for this basename: CHOL,HDL,LDLCALC,TRIG,CHOLHDL in the last 72 hours No results found for this basename:  TSH,T4TOTAL,FREET3,T3FREE,THYROIDAB in the last 72 hours No results found for this basename: VITAMINB12,FOLATE,FERRITIN,TIBC,IRON,RETICCTPCT in the last 72 hours  Medications: Scheduled    . amiodarone  200 mg Oral Daily  . ciprofloxacin  500 mg Oral BID  . collagenase   Topical Daily  . cycloSPORINE  1 drop Both Eyes Q12H  . feeding supplement  1 Container Oral TID WC  . furosemide  40 mg Oral BID  . guaiFENesin  600 mg Oral BID  . metoprolol tartrate  25 mg Oral BID  . multivitamins ther. w/minerals  1 tablet Oral Daily  . pantoprazole  40 mg Oral QAC breakfast  . potassium chloride  40 mEq Oral TID  . povidone-iodine  1 application Topical BID  . traZODone  150 mg Oral QHS  . vitamin C  500 mg Oral Daily  . warfarin  2 mg Oral q1800     Radiology/Studies:  Dg Chest 1 View  10/27/2011  *RADIOLOGY REPORT*  Clinical Data: Post left thoracentesis.  CHEST - 1 VIEW  Comparison: 10/26/2011 and 12/24/2010.  Findings: Decrease in size of left-sided pleural effusion post thoracentesis.  The curvilinear structures projecting over the posterior left second rib were present on the 10/24/2011 exam and felt to be unrelated to a pneumothorax.  Overall, no post procedure pneumothorax is noted.  Bilateral pleural effusions remain with basilar atelectasis suspected.  Post valve replacement.  Cardiomegaly.  Left central line tip proximal right atrium.  To be within the distal superior vena cava, this can  be retracted by 3.8 cm.  Pulmonary vascular prominence most notable centrally.  IMPRESSION: Decrease in size of left-sided pleural effusion post thoracentesis. No left-sided pneumothorax noted as discussed above.  Left central line tip proximal right atrium.  To be within the distal superior vena cava, this can be retracted by 3.8 cm.  Pulmonary vascular congestion, pleural effusions and basilar atelectasis remaining.  Cardiomegaly.  Original Report Authenticated By: Fuller Canada, M.D.   Dg Chest 2  View  10/28/2011  *RADIOLOGY REPORT*  Clinical Data: Status post CABG.  History of pleural effusions.  CHEST - 2 VIEW  Comparison: Single view of the chest 10/27/2011.  Findings: Small bilateral pleural effusions, greater on the left, persist without interval change.  There is associated basilar atelectasis.  Linear atelectasis or scar in the right upper lobe is identified.  Heart size is normal.  Left PICC is in place.  There is a small loop of the catheter in the right brachiocephalic vein with the tip in the lower superior vena cava.  IMPRESSION:  1.  No notable change in small bilateral pleural effusions, greater on the left. 2.  Left PICC now has a small loop in the right brachiocephalic vein.  The tip of the catheter is in the superior vena cava.  Original Report Authenticated By: Bernadene Bell. D'ALESSIO, M.D.   US Thoracentesis  10/27/2011  *RADIOLOGY REPORT*  Clinical Data:  Left pleural effusion  ULTRASOUND GUIDED left THORACENTESIS  Comparison:  None  An ultrasound guided thoracentesis was thoroughly discussed with the patient and questions answered.  The benefits, risks, alternatives and complications were also discussed.  The patient understands and wishes to proceed with the procedure.  Written consent was obtained.  Ultrasound was performed to localize and mark an adequate pocket of fluid in the left chest.  The area was then prepped and draped in the normal sterile fashion.  1% Lidocaine was used for local anesthesia.  Under ultrasound guidance a 19 gauge Yueh catheter was introduced.  Thoracentesis was performed.  The catheter was removed and a dressing applied.  Complications:  None  Findings: A total of approximately 500 ml of blood-tinged fluid was removed. A fluid sample was sent for laboratory analysis.  IMPRESSION: Successful ultrasound guided  left  thoracentesis yielding 500 ml of pleural fluid. The patient blood pressure was low with systolic 91 prior to procedure.  Blood pressure dropped  to systolic 85 after 500 ml withdrawn.  Thoracentesis was stopped at that point.   Did have events of systolic as low as 80 and as high as 94.  The patient  denies pain although did complain of feeling tired.  Back in room now with 2 liters of oxygen by nasal cannula.  Read by: Ralene Muskrat, P.A.-C  Original Report Authenticated By: Waynard Reeds, M.D.    INR: Will add last result for INR, ABG once components are confirmed Will add last 4 CBG results once components are confirmed  Assessment/Plan: S/P Procedure(s) (LRB): MINIMALLY INVASIVE MITRAL VALVE REPAIR (MVR) (Right)  1.Echo report looks good  Administrator, [employer] [facility]* *[facility]* [address] [place], [place] [address] [phone]  ------------------------------------------------------------ Transthoracic Echocardiography  Patient: [name] [name], [name] [name] MR #: [id number] Study Date: 10/28/2011 Gender: M Age: 77 Height: 195.6cm Weight: 80.3kg BSA: 2.7m^2 Pt. Status: Room: 2007  PERFORMING [place], [facility] ADMITTING Tressie Stalker, MD ORDERING Tressie Stalker, MD SONOGRAPHER Georgian Co, RDCS, CCT cc:  ------------------------------------------------------------ LV EF: 65% - 70%  ------------------------------------------------------------  Indications: V43.3 S/P Heart valve replacement.  ------------------------------------------------------------ History: PMH: Atrial fibrillation. Risk factors: History of prostate cancer and melanoma. GERD. Depression. Hypertension. Dyslipidemia.  ------------------------------------------------------------ Study Conclusions  - Left ventricle: The cavity size was normal. There was mild to moderate concentric hypertrophy. Systolic function was vigorous. The estimated ejection fraction was in the range of 65% to 70%. - Mitral valve: Calcified annulus. Moderately calcified leaflets . The findings are consistent with mild stenosis. Valve area by pressure  half-time: 2.18cm^2. Valve area by continuity equation (using LVOT flow): 2.19cm^2. - Right atrium: The atrium was mildly dilated. - Impressions: Technically limited study due to poor sound transmission. The mitral valve repair looks stable with mild post-op stenosis. No MR. Impressions:  - Technically limited study due to poor sound transmission. The mitral valve repair looks stable with mild post-op stenosis. No MR. Transthoracic echocardiography. M-mode, complete 2D, spectral Doppler, and color Doppler. Height: Height: 195.6cm. Height: 77in. Weight: Weight: 80.3kg. Weight: 176.6lb. Body mass index: BMI: 21kg/m^2. Body surface area: BSA: 2.91m^2. Blood pressure: 111/69. Patient status: Inpatient. Location: Bedside.  2. Labs reviewed, creat better , now off ACEI  3. Cont diuresis, may be able to decrease lasix soon  4. Cont wound care, may need plastics intervention for eschar.  LOS: 28 days    GOLD,WAYNE E 12/12/20129:25 AM   I have seen and examined the patient and agree with the assessment and plan as outlined.  ECHO looks perfect.  Overall Mr. Jaquith seems to be stabilizing and improving, although very slowly.  Will increase coumadin to 2.5 mg daily.  Will reexamine decubitus ulceration and groin wound tomorrow.  If it has gotten worse we will need to get Plastic Surgery consult.  If not we can continue with original plan for d/c to SNF with follow up in the outpatient wound clinic.  [name] 10/29/2011 7:36 PM

## 2011-10-29 NOTE — Progress Notes (Signed)
Physical Therapy Treatment Patient Details Name: [name] [name] MRN: [id number] DOB: May 19, 1931 Today's Date: 10/29/2011  PT Assessment/Plan  PT - Assessment/Plan Comments on Treatment Session: Patient continues to have decreased activity tolerance and safety awareness. Question whether pt. is plateauing.  Changed pt. frequency to 2 x/wk given that PT feels pt. is plateauing.  Met 3/6 goals of 10/14/11. Goals revised.   PT Plan: Discharge plan remains appropriate;Frequency needs to be updated (Changing pt frequency to 2 x week given ? plateau) PT Frequency: Min 2X/week Follow Up Recommendations: Skilled nursing facility;24 hour supervision/assistance Equipment Recommended: Defer to next venue PT Goals  Acute Rehab PT Goals PT Goal Formulation: With patient Time For Goal Achievement: 2 weeks PT Goal: Supine/Side to Sit - Progress: Revised (modified due to lack of progress/goal met) PT Goal: Sit to Stand - Progress: Met PT Goal: Stand to Sit - Progress: Met PT Goal: Ambulate - Progress: Revised (modified due to lack of progress/goal met) PT Goal: Up/Down Stairs - Progress: Revised (modified due to lack of progress/goal met) PT Goal: Perform Home Exercise Program - Progress: Met Additional Goals Additional Goal #1: Patient supine to sit with independence. PT Goal: Additional Goal #1 - Progress: Other (comment) Additional Goal #2: Patient to perform sit to stand with independence following sternal precautions. PT Goal: Additional Goal #2 - Progress: Other (comment) Additional Goal #3: Patient to ambulate 250 feet with Modified independence with RW.   PT Goal: Additional Goal #3 - Progress: Other (comment)  PT Treatment Precautions/Restrictions  Precautions Precautions: Sternal;Fall Precaution Comments: Reenforced precautions. Required Braces or Orthoses: No Restrictions Weight Bearing Restrictions: No Mobility (including Balance) Bed Mobility Rolling Right: Not tested  (comment) Rolling Left: 5: Supervision Rolling Left Details (indicate cue type and reason): cues for technique and sequence to adhere to sternal precautions.   Supine to Sit: 5: Supervision;HOB flat Supine to Sit Details (indicate cue type and reason): cues for initiation and hand placement Sit to Supine - Left: Not tested (comment) Scooting to [facility]: Not tested (comment) Transfers Sit to Stand: 5: Supervision;From elevated surface;With upper extremity assist;From bed Sit to Stand Details (indicate cue type and reason): max cues for hand placement on knees - pt not able to recall cues  Stand to Sit: 5: Supervision;With upper extremity assist;To chair/3-in-1 Stand to Sit Details: cues to adhere to sternal precautions Ambulation/Gait Ambulation/Gait Assistance: 4: Min assist Ambulation/Gait Assistance Details (indicate cue type and reason): Pt. shaky initially but this improved as he continued to ambulate.  Pt. required at least 4 standing rest breaks and 1 sitting rest break.  Pt. O2 sat dropped to 83 %-86% however would stay > 90% with pursed lip breathing.  Pt. ambulated 125 feet x 2.  Continues to need cues for safety.   Ambulation Distance (Feet): 250 Feet Assistive device: Rolling walker Gait Pattern: Shuffle;Trunk flexed Gait velocity: Decr cadence Stairs: No Wheelchair Mobility Wheelchair Mobility: No  Posture/Postural Control Posture/Postural Control: No significant limitations Dynamic Standing Balance Dynamic Standing - Balance Support: Bilateral upper extremity supported Dynamic Standing - Level of Assistance: 4: Min assist Dynamic Standing - Balance Activities: Forward lean/weight shifting Exercise    End of Session PT - End of Session Equipment Utilized During Treatment: Gait belt Activity Tolerance: Patient limited by fatigue Patient left: in chair;with call bell in reach;with bed alarm set (chair alarm) Nurse Communication: Mobility status for  ambulation General Behavior During Session: [facility] for tasks performed Cognition: [facility] for tasks performed  INGOLD,Makaylia Hewett 10/29/2011, 12:22 PM [employer]  Acute Rehabilitation [phone] (pager)

## 2011-10-29 NOTE — Progress Notes (Signed)
CSW advised by PA pt not ready to d/c today. CSW will continue to follow.   Baxter Flattery, MSW [phone]

## 2011-10-29 NOTE — Progress Notes (Signed)
CARDIAC REHAB PHASE I   PRE:  Rate/Rhythm: 77SR  BP:  Supine:   Sitting: 84/56  Standing:    SaO2: 96%RA  MODE:  Ambulation: 360 ft   POST:  Rate/Rhythem: 86  BP:  Supine:   Sitting: 92/68  Standing:    SaO2: 98%RA 1320-1345 Walked 360 ft on RA with rolling walker and asst x 2. Followed with chair and sat once to est. C/o slight lightheadedness with low BP but denied dizziness.  To bed after walk.  Call bell in reach.  Duanne Limerick

## 2011-10-30 ENCOUNTER — Encounter (HOSPITAL_BASED_OUTPATIENT_CLINIC_OR_DEPARTMENT_OTHER): Payer: Medicare Other | Attending: Internal Medicine

## 2011-10-30 DIAGNOSIS — I4891 Unspecified atrial fibrillation: Secondary | ICD-10-CM | POA: Insufficient documentation

## 2011-10-30 DIAGNOSIS — Z7901 Long term (current) use of anticoagulants: Secondary | ICD-10-CM | POA: Insufficient documentation

## 2011-10-30 DIAGNOSIS — L89109 Pressure ulcer of unspecified part of back, unspecified stage: Secondary | ICD-10-CM | POA: Insufficient documentation

## 2011-10-30 DIAGNOSIS — Z79899 Other long term (current) drug therapy: Secondary | ICD-10-CM | POA: Insufficient documentation

## 2011-10-30 DIAGNOSIS — E7800 Pure hypercholesterolemia, unspecified: Secondary | ICD-10-CM | POA: Insufficient documentation

## 2011-10-30 DIAGNOSIS — K219 Gastro-esophageal reflux disease without esophagitis: Secondary | ICD-10-CM | POA: Insufficient documentation

## 2011-10-30 DIAGNOSIS — Z85820 Personal history of malignant melanoma of skin: Secondary | ICD-10-CM | POA: Insufficient documentation

## 2011-10-30 DIAGNOSIS — M1990 Unspecified osteoarthritis, unspecified site: Secondary | ICD-10-CM | POA: Insufficient documentation

## 2011-10-30 DIAGNOSIS — I10 Essential (primary) hypertension: Secondary | ICD-10-CM | POA: Insufficient documentation

## 2011-10-30 DIAGNOSIS — L8990 Pressure ulcer of unspecified site, unspecified stage: Secondary | ICD-10-CM | POA: Insufficient documentation

## 2011-10-30 LAB — BODY FLUID CULTURE

## 2011-10-30 LAB — PROTIME-INR: Prothrombin Time: 21.8 seconds — ABNORMAL HIGH (ref 11.6–15.2)

## 2011-10-30 NOTE — Progress Notes (Signed)
Pt ambulated 150 feet on room air, pt tolerated activity well and is now resting in the bed.  [name] [name]

## 2011-10-30 NOTE — Progress Notes (Signed)
R groin site dressing changed, serosang drainage on dressing, red wound base, no odor present.  Packed with wet 2x2 and covered with 4x4 gauze. Ave Filter

## 2011-10-30 NOTE — Progress Notes (Signed)
Right groin incision packed with wet gauze per order and covered with dry dressing. Base of wound  is red, no odor present, and no drainage is present. Pt tolerated activity well.  [name] [name]

## 2011-10-30 NOTE — Progress Notes (Signed)
Pt ambulated entirety of unit with NT.  No SOB, tolerated well, very enthusiastic to walk again.  Will continue to monitor and encourage for third walk later this afternoon. Ave Filter

## 2011-10-30 NOTE — Progress Notes (Signed)
CARDIAC REHAB PHASE I   PRE:  Rate/Rhythm: 89SR  BP:  Supine:   Sitting: 92/56  Standing:    SaO2: 96%RA  MODE:  Ambulation: 400 ft   POST:  Rate/Rhythem: 104  BP:  Supine:   Sitting: 140/90  Standing:    SaO2: 94%RA 1045-1117 Pt walked 400 ft on RA with rolling walker and asst x 2. Followed with chair. Sat down once to rest. To recliner with call bell after walk. BP improved with walk.  Duanne Limerick

## 2011-10-30 NOTE — Progress Notes (Addendum)
29 Days Post-Op Procedure(s) (LRB): MINIMALLY INVASIVE MITRAL VALVE REPAIR (MVR) (Right)  Subjective: Patient in good spirits-joking around this am.  Objective: Vital signs in last 24 hours: Patient Vitals for the past 24 hrs:  BP Temp Temp src Pulse Resp SpO2 Weight  10/30/11 0647 102/54 mmHg 97.6 F (36.4 C) Oral 80  20  91 % 177 lb 14.4 oz (80.695 kg)  10/29/11 2202 103/61 mmHg 98.6 F (37 C) Oral 86  20  94 % -  10/29/11 2115 92/56 mmHg - - 82  - - -  10/29/11 1345 84/56 mmHg 98.2 F (36.8 C) Oral 77  20  96 % -  10/29/11 1022 - - - - - 83 % -   Pre op weight  91.6 kg Current Weight  10/30/11 177 lb 14.4 oz (80.695 kg)      Intake/Output from previous day: 12/12 0701 - 12/13 0700 In: 1800 [P.O.:1800] Out: 1950 [Urine:1950]   Physical Exam:  Cardiovascular: RRR, no murmurs, gallops, or rubs. Pulmonary:Slightly diminished at the bases; no rales, wheezes, or rhonchi. Abdomen: Soft, non tender, bowel sounds present. Extremities: Mild bilateral lower extremity edema. Wounds: Sternum: Clean and dry;no erythema or signs of infection. Wound care team and Dr. Cornelius Moras have already viewed decubitus and left groin wound. Already ecubitus and left groin wou Lab Results: CBC:No results found for this basename: WBC:2,HGB:2,HCT:2,PLT:2 in the last 72 hours BMET:  [facility] 10/29/11 0505 10/28/11 0456  NA 139 138  K 4.3 3.5  CL 98 96  CO2 35* 36*  GLUCOSE 111* 114*  BUN 32* 36*  CREATININE 1.23 1.36*  CALCIUM 8.8 8.5    PT/INR:  Basename 10/30/11 0500  LABPROT 21.8*  INR 1.86*   ABG:  INR: Will add last result for INR, ABG once components are confirmed Will add last 4 CBG results once components are confirmed  Assessment/Plan:  1. CV-SR. Continue current medications. Coumadin 2.5 tonight. 2.Volume Overload-Continue with diuresis. 3.Regarding decubitus and left groin wounds, these were seen by Dr. Cornelius Moras (according to the patient). Continue wound care for  now.  Ardelle Balls, PA 10/30/2011   I have seen and examined the patient and agree with the assessment and plan as outlined.  Decubitus ulcerations continue to slowly improve.  Groin wound is clean and granulating nicely.  Overall Mr Clingerman seems to be stabilizing nicely.  Will look into possibilities for follow up in wound care clinic for management of decubitus ulcerations after d/c to SNF  [facility] H 10/30/2011 10:52 AM

## 2011-10-30 NOTE — Progress Notes (Addendum)
Physical Therapy Wound Treatment Patient Details  Name: [name] [name] MRN: [id number] Date of Birth: [birth year]  Today's Date: 10/30/2011 Time: 0825-0910 Time Calculation (min): 45 min  Subjective  Subjective: "I am getting stiff."  Pain Score:    Wound Assessment  Pressure Ulcer 10/06/11 Stage II -  Partial thickness loss of dermis presenting as a shallow open ulcer with a red, pink wound bed without slough. Baseball sized reddened area on both sides of buttocks, non blanchable, skin irritation. (Active)  State of Healing Eschar 10/30/2011  8:53 AM  Site / Wound Assessment Red;Black;Brown;Pink 10/30/2011  8:53 AM  % Wound base Red or Granulating 50% 10/30/2011  8:53 AM  % Wound base Yellow 50% 10/25/2011  8:13 AM  % Wound base Black 50% 10/30/2011  8:53 AM  Peri-wound Assessment Induration 10/30/2011  8:53 AM  Wound Length (cm) 8.5 cm 10/30/2011  8:53 AM  Wound Width (cm) 6.1 cm 10/30/2011  8:53 AM  Wound Depth (cm) 0.1 cm 10/30/2011  8:53 AM  Drainage Amount Minimal 10/30/2011  8:53 AM  Drainage Description Other (Comment) 10/30/2011  8:53 AM  Treatment Hydrotherapy (Pulse lavage) 10/30/2011  8:53 AM  Dressing Type Moist to dry;Barrier Film (skin prep);ABD 10/30/2011  8:53 AM  Dressing Changed 10/30/2011  8:53 AM      Hydrotherapy Pulsed lavage therapy - wound location: lt. buttock, inner gluteal fold Pulsed Lavage with Suction (psi):  (8-12 Psi) Pulsed Lavage with Suction - Normal Saline Used: 1000 mL Pulsed Lavage Tip: Tip with splash shield Selective Debridement Selective Debridement - Location: lt. buttock Selective Debridement - Tools Used: Scalpel;Other (comment) (4x4s) Selective Debridement - Tissue Removed: scored eschar   Wound Assessment and Plan  Wound Therapy - Assess/Plan/Recommendations Wound Therapy - Clinical Statement: Wound stable today.  Contacted Dr. Cornelius Moras per request prior to treatment to view wound during treatment.  Increased time to discuss would  with Dr. Cornelius Moras and d/c plan. Wound Therapy - Functional Problem List: Decr. sitting due to buttock wound. Factors Delaying/Impairing Wound Healing: Immobility;Multiple medical problems;Polypharmacy Hydrotherapy Plan: Debridement;Dressing change;Patient/family education;Pulsatile lavage with suction Wound Therapy - Frequency: 6X / week Wound Therapy - Follow Up Recommendations: Skilled nursing facility  Wound Therapy Goals- Improve the function of patient's integumentary system by progressing the wound(s) through the phases of wound healing (inflammation - proliferation - remodeling) by: Decrease Necrotic Tissue - Progress: Progressing toward goal Increase Granulation Tissue - Progress: Progressing toward goal Improve Drainage Characteristics - Progress: Met Patient/Family Instruction Goal - Progress: Progressing toward goal  Goals will be updated until maximal potential achieved or discharge criteria met.  Discharge criteria: when goals achieved, discharge from hospital, MD decision/surgical intervention, no progress towards goals, refusal/missing three consecutive treatments without notification or medical reason.  [name] [name] 10/30/2011, 9:25 AM  10/30/2011 [name] [name], PT, DPT [phone]

## 2011-10-30 NOTE — Progress Notes (Signed)
Occupational Therapy Treatment Patient Details Name: [name] [name] MRN: [id number] DOB: Jun 08, 1931 Today's Date: 10/30/2011  OT Assessment/Plan OT Assessment/Plan Comments on Treatment Session: Pt. very motivated to participate with therapy today and with increased endurance with functional activities. OT Plan: Discharge plan remains appropriate OT Frequency: Min 1X/week Follow Up Recommendations: Skilled nursing facility Equipment Recommended: Defer to next venue OT Goals Acute Rehab OT Goals OT Goal Formulation: With patient Time For Goal Achievement: 2 weeks ADL Goals Pt Will Perform Grooming: with set-up;with supervision;Standing at sink ADL Goal: Grooming - Progress: Met Pt Will Perform Lower Body Bathing: with min assist;Sit to stand from bed ADL Goal: Lower Body Bathing - Progress: Not addressed Pt Will Perform Lower Body Dressing: with min assist;with adaptive equipment;Sit to stand from bed ADL Goal: Lower Body Dressing - Progress: Not addressed Pt Will Transfer to Toilet: with supervision;3-in-1;Stand pivot transfer ADL Goal: Toilet Transfer - Progress: Met Pt Will Perform Toileting - Hygiene: with set-up;Sit to stand from 3-in-1/toilet ADL Goal: Toileting - Hygiene - Progress: Met Additional ADL Goal #1:  (met)  OT Treatment Precautions/Restrictions  Precautions Precautions: Sternal;Fall Precaution Comments: Reenforced precautions. Restrictions Weight Bearing Restrictions: No   ADL ADL Grooming: Performed;Wash/dry hands;Wash/dry face;Teeth care;Brushing hair;Supervision/safety;Set up Grooming Details (indicate cue type and reason): [email or web address] ADL tasks for ~10 mins standing at the sink with min verbal cues for use with RW and min cues for upright position  Where Assessed - Grooming: Standing at sink Upper Body Dressing: Performed;Set up Upper Body Dressing Details (indicate cue type and reason): With don gown Where Assessed - Upper Body Dressing:  Standing Toilet Transfer: Performed;Minimal assistance Toilet Transfer Details (indicate cue type and reason): Pt. with use of urinal in standing Toilet Transfer Method: Not assessed Toileting - Clothing Manipulation: Performed;Minimal assistance Toileting - Clothing Manipulation Details (indicate cue type and reason): With moving gown Where Assessed - Toileting Clothing Manipulation: Standing Toileting - Hygiene: Performed;Set up Toileting - Hygiene Details (indicate cue type and reason): With standing Where Assessed - Toileting Hygiene: Standing Tub/Shower Transfer: Not assessed Tub/Shower Transfer Method: Not assessed ADL Comments: Pt. with increased activity tolerance today and motivated for OOB activity. Mobility  Bed Mobility Bed Mobility: No Transfers Transfers: Yes Sit to Stand: 5: Supervision;From elevated surface;With upper extremity assist;From chair/3-in-1 Sit to Stand Details (indicate cue type and reason): Mod verbal cues for hand placement on knees  Stand to Sit: 5: Supervision;With upper extremity assist;To chair/3-in-1 Stand to Sit Details: Min verbal cues to control descent     End of Session OT - End of Session Equipment Utilized During Treatment: Gait belt Activity Tolerance: Patient tolerated treatment well Patient left: in chair;with family/visitor present Nurse Communication: Mobility status for transfers General Behavior During Session: [name] for tasks performed Cognition: [name] for tasks performed  [name] [name], OTR/L Pager [phone]  10/30/2011, 2:44 PM

## 2011-10-31 ENCOUNTER — Inpatient Hospital Stay (HOSPITAL_COMMUNITY): Payer: Medicare Other

## 2011-10-31 LAB — CBC
Hemoglobin: 10.9 g/dL — ABNORMAL LOW (ref 13.0–17.0)
MCV: 89.6 fL (ref 78.0–100.0)
Platelets: 63 10*3/uL — ABNORMAL LOW (ref 150–400)
RBC: 3.94 MIL/uL — ABNORMAL LOW (ref 4.22–5.81)
WBC: 9.4 10*3/uL (ref 4.0–10.5)

## 2011-10-31 LAB — COMPREHENSIVE METABOLIC PANEL
AST: 20 U/L (ref 0–37)
Albumin: 2.3 g/dL — ABNORMAL LOW (ref 3.5–5.2)
BUN: 26 mg/dL — ABNORMAL HIGH (ref 6–23)
Calcium: 8.5 mg/dL (ref 8.4–10.5)
Chloride: 103 mEq/L (ref 96–112)
Creatinine, Ser: 1.15 mg/dL (ref 0.50–1.35)
GFR calc non Af Amer: 59 mL/min — ABNORMAL LOW (ref >90)
Total Bilirubin: 0.6 mg/dL (ref 0.3–1.2)

## 2011-10-31 LAB — PROTIME-INR
INR: 1.99 — ABNORMAL HIGH (ref 0.00–1.49)
Prothrombin Time: 22.9 seconds — ABNORMAL HIGH (ref 11.6–15.2)

## 2011-10-31 LAB — PREALBUMIN: Prealbumin: 14.6 mg/dL — ABNORMAL LOW (ref 17.0–34.0)

## 2011-10-31 MED ORDER — GUAIFENESIN ER 600 MG PO TB12: 600.0000 mg | ORAL_TABLET | Freq: Two times a day (BID) | ORAL | Status: DC

## 2011-10-31 MED ORDER — POTASSIUM CHLORIDE CRYS ER 20 MEQ PO TBCR: 40.0000 meq | EXTENDED_RELEASE_TABLET | Freq: Every day | ORAL | Status: DC

## 2011-10-31 MED ORDER — AMIODARONE HCL 200 MG PO TABS: 200.0000 mg | ORAL_TABLET | Freq: Every day | ORAL | Status: DC

## 2011-10-31 MED ORDER — FUROSEMIDE 40 MG PO TABS: 40.0000 mg | ORAL_TABLET | Freq: Two times a day (BID) | ORAL | Status: DC

## 2011-10-31 MED ORDER — WARFARIN SODIUM 2.5 MG PO TABS: 2.5000 mg | ORAL_TABLET | Freq: Every day | ORAL | Status: DC

## 2011-10-31 MED ORDER — COLLAGENASE 250 UNIT/GM EX OINT: TOPICAL_OINTMENT | Freq: Every day | CUTANEOUS | Status: AC

## 2011-10-31 MED ORDER — CIPROFLOXACIN HCL 500 MG PO TABS: 500.0000 mg | ORAL_TABLET | Freq: Two times a day (BID) | ORAL | Status: AC

## 2011-10-31 NOTE — Procedures (Signed)
Successful US guided L sided thoracentesis with 1100 of reddish pleural fluid aspirated.  No immediate complications.

## 2011-10-31 NOTE — Progress Notes (Addendum)
   CARDIOTHORACIC SURGERY PROGRESS NOTE  30 Days Post-Op  S/P Procedure(s) (LRB): MINIMALLY INVASIVE MITRAL VALVE REPAIR (MVR) (Right)  Subjective: Reports feeling better. Best day yet.  Had a crown fall off a tooth overnight  Objective: Vital signs in last 24 hours: Temp:  [97.1 F (36.2 C)-98.7 F (37.1 C)] 98.7 F (37.1 C) (12/14 0700) Pulse Rate:  [82-96] 82  (12/14 0700) Cardiac Rhythm:  [-] Normal sinus rhythm;Heart block (12/13 2030) Resp:  [20] 20  (12/14 0700) BP: (92-111)/(56-68) 99/61 mmHg (12/14 0700) SpO2:  [91 %-92 %] 91 % (12/14 0700) Weight:  [80.5 kg (177 lb 7.5 oz)] 177 lb 7.5 oz (80.5 kg) (12/14 0700)  Physical Exam:  Rhythm:   sinus  Breath sounds: Clear but diminished at bases L>R  Heart sounds:  RRR no murmur  Incisions:  Healing well  Abdomen:  soft  Extremities:  Warm, minimal edema   Intake/Output from previous day: 12/13 0701 - 12/14 0700 In: 960 [P.O.:960] Out: 900 [Urine:900] Intake/Output this shift:    Lab Results:  Basename 10/31/11 0530  WBC 9.4  HGB 10.9*  HCT 35.3*  PLT 63*   BMET:  Basename 10/31/11 0530 10/29/11 0505  NA 138 139  K 4.5 4.3  CL 103 98  CO2 31 35*  GLUCOSE 115* 111*  BUN 26* 32*  CREATININE 1.15 1.23  CALCIUM 8.5 8.8    CBG (last 3)  No results found for this basename: GLUCAP:3 in the last 72 hours PT/INR:   Basename 10/31/11 0530  LABPROT 22.9*  INR 1.99*    CXR:  Slight increase left effusion  Assessment/Plan: S/P Procedure(s) (LRB): MINIMALLY INVASIVE MITRAL VALVE REPAIR (MVR) (Right)  Overall stable, improving Left pleural effusion, incompletely drained earlier this week (thoracentesis aborted prematurely) Lost a crown off of a tooth overnight Chronic decubitus ulcerations slowly improving  Will try repeat thoracentesis today Dental medicine consult Will try to discuss plan for long term wound care with MD at outpatient wound clinic today Tentatively plan d/c to SNF on Monday if  stable/improved  [name],[name] 10/31/2011 8:38 AM  Mr Kirkpatrick did very well with thoracentesis  ( out) and feels much better.  [facility].  He potentially could be d/c'd to SNF tomorrow.  Will check repeat CXR in am.  [name],[name] 10/31/2011 1:48 PM

## 2011-10-31 NOTE — Progress Notes (Signed)
R groin dressing changed-serosang drainage present on dressing, odor present, red wound base.  Repacked wet to dry per MD orders.  Will continue to monitor and treat. Ave Filter

## 2011-10-31 NOTE — Discharge Summary (Signed)
I agree with the above discharge summary and plan for follow-up.  [name] [name],[name] [name]  

## 2011-10-31 NOTE — Discharge Summary (Signed)
[address]            [place] [address]          [phone]      [name] [name] 1931/07/19 77 y.o. [id number]  10/01/2011   [name], MD  Mitral Regurgitation  Addendum: Please see the previously dictated discharge summary dated 10/17/2011. The patient continued to require ongoing hospitalization due to multiple factors. First he required significant additional diuresis due to congestive failure with a BNP greater than 3000. Additionally he has undergone left-sided thoracentesis on 2 occasions including today's date which obtained 1100 cc of fluid. Additionally he has required aggressive for ongoing management of his decubitus wound. This includes aggressive physical therapy and management. Arrangements have been made for a skilled nursing facility and he is tentatively scheduled for transfer in the morning. Additionally he will have wound care managed outpatient by the [facility] with weekly visits. He has a right groin wound as well which is getting twice daily dressing changes with saline moistened gauze. Currently the physical therapy wound care includes using Santyl on the decubitus daily following hydrotherapy. A 4 x 4 with moist saline is placed over this Santyl  and it is covered with an ABD pad and tape.   Additionally he continues medical management of his mitral valve repair including ongoing diuresis as well as Coumadin therapy and amiodarone.   Basename 10/31/11 0530 10/29/11 0505  NA 138 139  K 4.5 4.3  CL 103 98  CO2 31 35*  GLUCOSE 115* 111*  BUN 26* 32*  CALCIUM 8.5 8.8    Basename 10/31/11 0530  WBC 9.4  HGB 10.9*  HCT 35.3*  PLT 63*    Basename 10/31/11 0530 10/30/11 0500  INR 1.99* 1.86*     Discharge Instructions:  The patient is discharged to skilled nursing facility with extensive instructions on wound care and progressive ambulation.  They are instructed not to drive or perform any  heavy lifting until returning to see the physician in his office.  Discharge Diagnosis:  Mitral Regurgitation  Secondary Diagnosis: Patient Active Problem List  Diagnoses  . TINEA PEDIS  . ADENOCARCINOMA, PROSTATE  . HYPERCHOLESTEROLEMIA  . THROMBOCYTOPENIA  . DEPRESSION  . HYPERTENSION, BENIGN ESSENTIAL  . GERD  . DERMATITIS, ATOPIC  . CALLUS, LEFT FOOT  . OSTEOARTHRITIS, GENERALIZED, MULTIPLE JOINTS  . BACK PAIN, CHRONIC  . MUSCLE SPASM, BACK  . PERSONAL HISTORY MALIGNANT NEOPLASM PROSTATE  . PERSONAL HISTORY OF MALIGNANT MELANOMA OF SKIN  . ARRHYTHMIA, HX OF  . PERSONAL HISTORY OF COLONIC POLYPS  . Cerumen impaction  . Hearing loss  . Valvular heart disease  . S/P mitral valve repair  . Atrial fibrillation  . Pneumothorax, postoperative  . Decubitus skin ulcer  . Pleural effusion due to congestive heart failure   Past Medical History  Diagnosis Date  . Prostate cancer   . Melanoma     Left Shoulder  . Vision abnormalities     Cornea scarring  . Osteoarthritis     Knees  . Hypertension   . Pure hypercholesterolemia   . Depressive disorder, not elsewhere classified   . MVP (mitral valve prolapse)   . Personal history of colonic polyps   . Valvular heart disease     Last echo in 2010 with EF 55 to 60%, +MVP, mild MR, diastolic dysfunction, biatrial  enlargement, mild AI and mild pulmonary HTN  . Normal nuclear stress test 2007  . Thrombocytopenia   . Mitral regurgitation   . MVP (mitral valve prolapse)   . PVC (premature ventricular contraction)   . Shortness of breath   . Blood transfusion   . Neuropathy   . Wears glasses   . Heart murmur     Dr Patty Sermons cardiologist       Cheskel, Silverio  Home Medication Instructions [id number]   Printed on:10/31/11 1417  Medication Information                    RESTASIS 0.05 % ophthalmic emulsion Place 1 drop into both eyes every 12 (twelve) hours.            Sodium Chloride, Hypertonic, (MURO 128  OP) Apply 1 drop to eye 2 (two) times daily.            aspirin EC 81 MG tablet Take 81 mg by mouth daily.             sodium chloride (MURO 128) 5 % ophthalmic ointment Place 1 drop into both eyes at bedtime.             atorvastatin (LIPITOR) 10 MG tablet Take 10 mg by mouth daily.             feeding supplement (ENSURE) PUDG Take 1 Container by mouth 3 (three) times daily with meals.           metoprolol tartrate (LOPRESSOR) 25 MG tablet Take 1 tablet (25 mg total) by mouth 2 (two) times daily.           Multiple Vitamins-Minerals (MULTIVITAMINS THER. W/MINERALS) TABS Take 1 tablet by mouth daily.           pantoprazole (PROTONIX) 40 MG tablet Take 1 tablet (40 mg total) by mouth daily before breakfast.           potassium chloride SA (K-DUR,KLOR-CON) 20 MEQ tablet Take 1 tablet (20 mEq total) by mouth daily.           vitamin C (VITAMIN C) 500 MG tablet Take 1 tablet (500 mg total) by mouth daily.           traZODone (DESYREL) 100 MG tablet Take 1.5 tablets (150 mg total) by mouth at bedtime.           amiodarone (PACERONE) 200 MG tablet Take 1 tablet (200 mg total) by mouth daily.           ciprofloxacin (CIPRO) 500 MG tablet Take 1 tablet (500 mg total) by mouth 2 (two) times daily.           collagenase (SANTYL) ointment Apply topically daily.           furosemide (LASIX) 40 MG tablet Take 1 tablet (40 mg total) by mouth 2 (two) times daily.           guaiFENesin (MUCINEX) 600 MG 12 hr tablet Take 1 tablet (600 mg total) by mouth 2 (two) times daily.           potassium chloride SA (K-DUR,KLOR-CON) 20 MEQ tablet Take 2 tablets (40 mEq total) by mouth daily.           warfarin (COUMADIN) 2.5 MG tablet Take 1 tablet (2.5 mg total) by mouth daily at 6 PM.             Disposition: To skilled nursing facility Patient's condition is  Limited  Gershon Crane, PA-C 10/31/2011  2:17 PM

## 2011-10-31 NOTE — Progress Notes (Signed)
Physical Therapy Wound Treatment Patient Details  Name: [name] [name] MRN: [id number] Date of Birth: 28-Oct-1931  Today's Date: 10/31/2011 Time: 7829-5621 Time Calculation (min): 27 min  Subjective  Subjective: "How does it look?" pt asked.  Pain Score:    Wound Assessment  Pressure Ulcer 10/06/11 Stage II -  Partial thickness loss of dermis presenting as a shallow open ulcer with a red, pink wound bed without slough. Baseball sized reddened area on both sides of buttocks, non blanchable, skin irritation. (Active)  State of Healing Early/partial granulation 10/31/2011  9:13 AM  Site / Wound Assessment Black;Brown;Red;Pink 10/31/2011  9:13 AM  % Wound base Red or Granulating 60% 10/30/2011  8:53 AM  % Wound base Yellow 0% 10/25/2011  8:13 AM  % Wound base Black 40% 10/30/2011  8:53 AM  Peri-wound Assessment Induration 10/31/2011  9:13 AM  Wound Length (cm) 8.5 cm 10/30/2011  8:53 AM  Wound Width (cm) 6.1 cm 10/30/2011  8:53 AM  Wound Depth (cm) 0.1 cm 10/30/2011  8:53 AM  Drainage Amount Minimal 10/31/2011  9:13 AM  Drainage Description Other (Comment) 10/30/2011  8:53 AM  Treatment Hydrotherapy (Pulse lavage) 10/31/2011  9:13 AM  Dressing Type Moist to dry;Barrier Film (skin prep);ABD 10/31/2011  9:13 AM  Dressing Changed 10/31/2011  9:13 AM     Wound 10/06/11 Other (Comment) Sacrum Mid (Active)     Incision 10/01/11 Chest Right;Upper (Active)  Site / Wound Assessment Clean;Dry 10/30/2011  8:30 PM  Incision Length (cm) 8 cm 10/06/2011  8:00 PM  Margins Attached edges (approximated) 10/30/2011  8:30 PM  Closure Approximated;Skin glue 10/30/2011  8:30 PM  Drainage Amount None 10/30/2011  8:30 PM  Drainage Description No odor 10/30/2011  8:53 AM  Treatment Cleansed 10/30/2011  8:53 AM  Dressing Type None 10/30/2011  8:53 AM  Dressing Clean;Dry 10/19/2011 10:30 PM     Incision 10/01/11 Chest Right;Upper (Active)     Incision 10/01/11 Sternum Mid (Active)  Site / Wound  Assessment Clean;Dry 10/30/2011  8:30 PM  Incision Length (cm) 18 cm 10/06/2011  8:00 PM  Margins Attached edges (approximated) 10/30/2011  8:30 PM  Closure Approximated 10/30/2011  8:30 PM  Drainage Amount None 10/30/2011  8:30 PM  Drainage Description No odor 10/30/2011  8:53 AM  Treatment Cleansed 10/30/2011  8:53 AM  Dressing Type None 10/30/2011  8:53 AM  Dressing Intact;Clean;Dry 10/17/2011  9:31 AM     Incision 10/07/11 Chest Right;Lateral (Active)  Site / Wound Assessment Clean;Dry 10/30/2011  8:30 PM  Margins Attached edges (approximated) 10/30/2011  8:30 PM  Closure Approximated;Other (Comment) 10/30/2011  8:30 PM  Drainage Amount None 10/30/2011  8:30 PM  Drainage Description No odor 10/30/2011  8:53 AM  Treatment Cleansed 10/30/2011  8:53 AM  Dressing Type None 10/30/2011  8:30 PM  Dressing Clean;Dry;Intact 10/17/2011  8:00 PM   Hydrotherapy Pulsed lavage therapy - wound location: lt. buttock, inner gluteal fold Pulsed Lavage with Suction (psi):  (4-12 psi) Pulsed Lavage with Suction - Normal Saline Used: 1000 mL Pulsed Lavage Tip: Tip with splash shield Selective Debridement Selective Debridement - Location: lt. buttock Selective Debridement - Tools Used:  (4x4's) Selective Debridement - Tissue Removed: yellow   Wound Assessment and Plan  Wound Therapy - Assess/Plan/Recommendations Wound Therapy - Clinical Statement: Wound improving. Wound Therapy - Follow Up Recommendations: Skilled nursing facility  Wound Therapy Goals- Improve the function of patient's integumentary system by progressing the wound(s) through the phases of wound healing (inflammation - proliferation - remodeling)  by: Decrease Necrotic Tissue - Progress: Progressing toward goal Increase Granulation Tissue - Progress: Progressing toward goal Patient/Family Instruction Goal - Progress: Progressing toward goal  Goals will be updated until maximal potential achieved or discharge criteria met.   Discharge criteria: when goals achieved, discharge from hospital, MD decision/surgical intervention, no progress towards goals, refusal/missing three consecutive treatments without notification or medical reason.  [name] [name] 10/31/2011, 9:17 AM  [name] PT [phone]

## 2011-10-31 NOTE — Progress Notes (Signed)
Pt ambulated entirety of unit with RN and walker on RA.  Tolerated very well.  Slightly shakey, no SOB, had to take 2 standing rest breaks.  Back to room, encouraged to take 2 more walks today.  Will continue to monitor and encourage. Ave Filter

## 2011-10-31 NOTE — Progress Notes (Signed)
Anticipate pt will d/c Saturday 12/15. CSW has sent d/c addendum to SNF and will handoff to weekend CSW to facilitate pt transfer to facility.  Baxter Flattery, MSW [phone]

## 2011-10-31 NOTE — Progress Notes (Signed)
Subjective:  Making Progress with ambulation.  Less dyspneic. Rhythm is NSR on amiodarone.  Objective:  Vital Signs in the last 24 hours: Temp:  [97.1 F (36.2 C)-98.7 F (37.1 C)] 98.7 F (37.1 C) (12/14 0700) Pulse Rate:  [82-96] 82  (12/14 0700) Resp:  [20] 20  (12/14 0700) BP: (92-111)/(56-68) 99/61 mmHg (12/14 0700) SpO2:  [91 %-92 %] 91 % (12/14 0700) Weight:  [177 lb 7.5 oz (80.5 kg)] 177 lb 7.5 oz (80.5 kg) (12/14 0700)  Intake/Output from previous day: 12/13 0701 - 12/14 0700 In: 960 [P.O.:960] Out: 900 [Urine:900] Intake/Output from this shift:       . amiodarone  200 mg Oral Daily  . ciprofloxacin  500 mg Oral BID  . collagenase   Topical Daily  . cycloSPORINE  1 drop Both Eyes Q12H  . feeding supplement  1 Container Oral TID WC  . furosemide  40 mg Oral BID  . guaiFENesin  600 mg Oral BID  . metoprolol tartrate  25 mg Oral BID  . multivitamins ther. w/minerals  1 tablet Oral Daily  . pantoprazole  40 mg Oral QAC breakfast  . potassium chloride  40 mEq Oral TID  . povidone-iodine  1 application Topical BID  . traZODone  150 mg Oral QHS  . vitamin C  500 mg Oral Daily  . warfarin  2.5 mg Oral q1800      Physical Exam: The patient appears to be in no distress.  Head and neck exam reveals that the pupils are equal and reactive.  The extraocular movements are full.  There is no scleral icterus.  Mouth and pharynx are benign.  No lymphadenopathy.  No carotid bruits.  The jugular venous pressure is normal.  Thyroid is not enlarged or tender.  Chest is clear to percussion and auscultation.  No rales or rhonchi.  Expansion of the chest is symmetrical.  Heart reveals no abnormal lift or heave.  First and second heart sounds are normal.  There is no murmur gallop rub or click.  The abdomen is soft and nontender.  Bowel sounds are normoactive.  There is no hepatosplenomegaly or mass.  There are no abdominal bruits.  Extremities reveal no phlebitis or edema.   Pedal pulses are good.  There is no cyanosis or clubbing.  Neurologic exam is normal strength and no lateralizing weakness.  No sensory deficits.    Lab Results:  Basename 10/31/11 0530  WBC 9.4  HGB 10.9*  PLT 63*    Basename 10/31/11 0530 10/29/11 0505  NA 138 139  K 4.5 4.3  CL 103 98  CO2 31 35*  GLUCOSE 115* 111*  BUN 26* 32*  CREATININE 1.15 1.23   No results found for this basename: TROPONINI:2,CK,MB:2 in the last 72 hours Hepatic Function Panel  Basename 10/31/11 0530  PROT 5.6*  ALBUMIN 2.3*  AST 20  ALT 23  ALKPHOS 87  BILITOT 0.6  BILIDIR --  IBILI --   No results found for this basename: CHOL in the last 72 hours No results found for this basename: PROTIME in the last 72 hours  Imaging: Imaging results have been reviewed  Cardiac Studies:  Assessment/Plan:  Patient Active Problem List  Diagnoses  . TINEA PEDIS  . ADENOCARCINOMA, PROSTATE  . HYPERCHOLESTEROLEMIA  . THROMBOCYTOPENIA  . DEPRESSION  . HYPERTENSION, BENIGN ESSENTIAL  . GERD  . DERMATITIS, ATOPIC  . CALLUS, LEFT FOOT  . OSTEOARTHRITIS, GENERALIZED, MULTIPLE JOINTS  . BACK PAIN, CHRONIC  .  MUSCLE SPASM, BACK  . PERSONAL HISTORY MALIGNANT NEOPLASM PROSTATE  . PERSONAL HISTORY OF MALIGNANT MELANOMA OF SKIN  . ARRHYTHMIA, HX OF  . PERSONAL HISTORY OF COLONIC POLYPS  . Cerumen impaction  . Hearing loss  . Valvular heart disease  . S/P mitral valve repair  . Atrial fibrillation  . Pneumothorax, postoperative  . Decubitus skin ulcer  . Pleural effusion due to congestive heart failure  Weight is stable.  Renal function has improved. For repeat thorocentesis today.  Continue present cardiac meds.    LOS: 30 days    Cassell Clement 10/31/2011, 8:25 AM

## 2011-11-01 ENCOUNTER — Inpatient Hospital Stay (HOSPITAL_COMMUNITY): Payer: Medicare Other

## 2011-11-01 NOTE — Progress Notes (Signed)
Physical Therapy Wound Treatment Patient Details  Name: [name] [name] MRN: [id number] Date of Birth: June 14, 1931  Today's Date: 11/01/2011 Time: 1478-2956 Time Calculation (min): 26 min  Subjective  Subjective: "I am stiff," pt stated.  Pain Score:    Wound Assessment  Pressure Ulcer 10/06/11 Stage II -  Partial thickness loss of dermis presenting as a shallow open ulcer with a red, pink wound bed without slough. Baseball sized reddened area on both sides of buttocks, non blanchable, skin irritation. (Active)  State of Healing Early/partial granulation 11/01/2011  9:11 AM  Site / Wound Assessment Pink;Red;Brown;Black;Yellow 11/01/2011  9:11 AM  % Wound base Red or Granulating 60% 10/31/2011  9:20 AM  % Wound base Yellow 0% 10/25/2011  8:13 AM  % Wound base Black 40% 10/31/2011  9:20 AM  Peri-wound Assessment Induration 11/01/2011  9:11 AM  Wound Length (cm) 8.5 cm 10/30/2011  8:53 AM  Wound Width (cm) 6.1 cm 10/30/2011  8:53 AM  Wound Depth (cm) 0.1 cm 10/30/2011  8:53 AM  Margins Unattacted edges (unapproximated) 11/01/2011  8:29 AM  Drainage Amount Minimal 11/01/2011  9:11 AM  Drainage Description Other (Comment) 11/01/2011  9:11 AM  Treatment Hydrotherapy (Pulse lavage) 11/01/2011  9:11 AM  Dressing Type Moist to dry;Barrier Film (skin prep);ABD 11/01/2011  9:11 AM  Dressing Changed 11/01/2011  9:11 AM     Hydrotherapy Pulsed lavage therapy - wound location: lt. buttock, inner gluteal fold Pulsed Lavage with Suction (psi):  (4-12 psi) Pulsed Lavage with Suction - Normal Saline Used: 1000 mL Pulsed Lavage Tip: Tip with splash shield Selective Debridement Selective Debridement - Location: lt. buttock Selective Debridement - Tools Used:  (4x4) Selective Debridement - Tissue Removed: yellow   Wound Assessment and Plan  Wound Therapy - Assess/Plan/Recommendations Wound Therapy - Clinical Statement: Improving slowly. Wound Therapy - Follow Up Recommendations: Skilled nursing  facility;Wound Care Center  Wound Therapy Goals- Improve the function of patient's integumentary system by progressing the wound(s) through the phases of wound healing (inflammation - proliferation - remodeling) by: Decrease Necrotic Tissue - Progress: Progressing toward goal Increase Granulation Tissue - Progress: Progressing toward goal Patient/Family Instruction Goal - Progress: Progressing toward goal  Goals will be updated until maximal potential achieved or discharge criteria met.  Discharge criteria: when goals achieved, discharge from hospital, MD decision/surgical intervention, no progress towards goals, refusal/missing three consecutive treatments without notification or medical reason.  [name] [name] 11/01/2011, 9:14 AM [name] PT [phone]

## 2011-11-01 NOTE — Progress Notes (Addendum)
31 Days Post-Op Procedure(s) (LRB): MINIMALLY INVASIVE MITRAL VALVE REPAIR (MVR) (Right)  Subjective: Complaints of a stuffy nose this am.  Objective: Vital signs in last 24 hours: Patient Vitals for the past 24 hrs:  BP Temp Temp src Pulse Resp SpO2  11/01/11 0455 93/56 mmHg 97.1 F (36.2 C) Oral 85  20  95 %  10/31/11 2134 97/63 mmHg 97 F (36.1 C) Oral 106  20  94 %  10/31/11 1751 109/72 mmHg 97.4 F (36.3 C) Oral 89  18  95 %  10/31/11 1508 106/65 mmHg 97.4 F (36.3 C) Oral 85  20  95 %  10/31/11 1126 94/52 mmHg - - - - -  10/31/11 1028 103/46 mmHg - - - - 98 %  10/31/11 1021 99/58 mmHg - - - - 97 %  10/31/11 1013 113/57 mmHg - - - - 98 %  10/31/11 0948 83/55 mmHg - - - - 96 %   Pre op weight  91.6 kg Current Weight  10/31/11 177 lb 7.5 oz (80.5 kg)      Intake/Output from previous day: 12/14 0701 - 12/15 0700 In: 720 [P.O.:720] Out: 1375 [Urine:1375]   Physical Exam:  Cardiovascular: RRR, no murmurs, gallops, or rubs. Pulmonary: Clear to auscultation bilaterally; no rales, wheezes, or rhonchi. Abdomen: Soft, non tender, bowel sounds present. Extremities: Mild bilateral lower extremity edema. Wounds: Clean and dry.  No erythema or signs of infection.  Lab Results: CBC: Basename 10/31/11 0530  WBC 9.4  HGB 10.9*  HCT 35.3*  PLT 63*   BMET:  Basename 10/31/11 0530  NA 138  K 4.5  CL 103  CO2 31  GLUCOSE 115*  BUN 26*  CREATININE 1.15  CALCIUM 8.5    PT/INR:  Basename 11/01/11 0600  LABPROT 23.7*  INR 2.07*   ABG:  INR: Will add last result for INR, ABG once components are confirmed Will add last 4 CBG results once components are confirmed  Assessment/Plan:  1. CV - Stable. Continue with current medications. 2.  Pulmonary - s/p left thoracentesis 12/14 (approx 1100 cc removed).  CXR show smaller left pleural effusion and trace right pleural effusion, and no ptx. 3.Chronic decubitus ulcers slowly improving. 4.Lost a crown 12/13. Dentist  has not been here yet. Will need follow up as outpatient. 5.Surgically stable for discharge to SNF today.    Ardelle Balls, PA 11/01/2011   Agree with above.

## 2011-11-01 NOTE — Progress Notes (Signed)
Patient discharge to SNF via ambulance  11/01/2011 1:05 PM. All necessary paperwork sent with patient to facility. Rachael Ferrie, Chrystine Oiler

## 2011-11-01 NOTE — Progress Notes (Signed)
Patient ambulated 150 ft on room air via rolling walker.  Patient tolerated procedure well.  Will continue to monitor.

## 2011-11-01 NOTE — Progress Notes (Signed)
Clinical Social Worker phoned SNF [facility]) and pt's dtr to confirm transfer of pt to SNF.  CSW ensured all appropriate paperwork was submitted to SNF.   CSW to sign off at dc.   Angelia Mould, MSW, [place] [phone]

## 2011-11-03 ENCOUNTER — Ambulatory Visit (INDEPENDENT_AMBULATORY_CARE_PROVIDER_SITE_OTHER): Payer: Self-pay | Admitting: Cardiology

## 2011-11-03 DIAGNOSIS — I4891 Unspecified atrial fibrillation: Secondary | ICD-10-CM

## 2011-11-03 DIAGNOSIS — R0989 Other specified symptoms and signs involving the circulatory and respiratory systems: Secondary | ICD-10-CM

## 2011-11-03 DIAGNOSIS — Z9889 Other specified postprocedural states: Secondary | ICD-10-CM

## 2011-11-03 DIAGNOSIS — Z7901 Long term (current) use of anticoagulants: Secondary | ICD-10-CM | POA: Insufficient documentation

## 2011-11-03 LAB — PROTIME-INR: INR: 2.6 — AB (ref 0.9–1.1)

## 2011-11-03 NOTE — Discharge Summary (Signed)
I agree with the above discharge summary and plan for follow-up.  Kenard Morawski H  

## 2011-11-06 NOTE — Progress Notes (Signed)
[facility]  NAME:  [name] [name]                    ACCOUNT NO.:  MEDICAL RECORD NO.:  [id number]      DATE OF BIRTH:  [birth year]  PHYSICIAN:  Wayland Denis, DO       VISIT DATE:  11/05/2011                                  OFFICE VISIT   [name] is a 77 year old gentleman who is here for evaluation of sacral ischial ulcers.  He has been using Santyl on them over the last several weeks, and he is in a rehab facility.  They are improving some but the process has been slow.  Originally, he underwent cardiac surgery and had an extended period of time on the OR bed.  It was at that time that history indicates he developed these pressure sores on his sacral ischial area more on the low sacral area.  He is at rehab to rehab from the cardiac surgery and as far as I can tell, he is not on an air mattress bed.  PAST MEDICAL HISTORY:  Chronic heart failure, hypertension, atrial fibrillation, pleural effusion, adenocarcinoma, skin melanoma hypercholesterolemia, depression, thrombocytopenia, gastroesophageal reflux disease, colon polyps, osteoarthritis.  SURGICAL HISTORY:  Thoracentesis, mitral valve repair, prostatectomy, bilateral arthroscopic knee surgery, melanoma resection, and heart surgery.  MEDICATIONS:  Restasis, aspirin, amiodarone, vitamin C, Cipro, Lasix, Mucinex, Lopressor, multivitamin, Protonix, K-Dur, Coumadin, Lipitor, trazodone.  ALLERGIES:  No known drug allergies.  SOCIAL HISTORY:  The patient is currently at a rehab but lives alone at home.  REVIEW OF SYSTEMS:  As noted above.  PHYSICAL EXAMINATION:  GENERAL:  He is alert, oriented, cooperative, very pleasant. HEENT:  Pupils are equal.  Extraocular muscles are intact. NEUROLOGICAL:  He seems to be a good historian.  He seems to have an understanding of his situation. NECK:  There is no cervical lymphadenopathy. CHEST:  His breathing is unlabored at present. CARDIOVASCULAR:  Heart rate  is regular. ABDOMEN:  No abdominal tenderness.  He has 2 sacral ulcers 1 with black necrosis in the center portion.  Full details are noted in the nurse's note, and I am not going to debride today because he is on Coumadin, and he will likely bleed quite a bit.  We are going to continue with the Santyl to see how that works in the next 2 weeks and then re-evaluate.     Wayland Denis, DO     CS/MEDQ  D:  11/05/2011  T:  11/06/2011  Job:  [id number]

## 2011-11-10 ENCOUNTER — Encounter: Payer: Self-pay | Admitting: Cardiology

## 2011-11-13 ENCOUNTER — Ambulatory Visit: Payer: Self-pay | Admitting: Cardiovascular Disease

## 2011-11-13 DIAGNOSIS — Z9889 Other specified postprocedural states: Secondary | ICD-10-CM

## 2011-11-13 DIAGNOSIS — I4891 Unspecified atrial fibrillation: Secondary | ICD-10-CM

## 2011-11-13 DIAGNOSIS — Z7901 Long term (current) use of anticoagulants: Secondary | ICD-10-CM

## 2011-11-19 ENCOUNTER — Encounter: Payer: Self-pay | Admitting: Cardiology

## 2011-11-19 ENCOUNTER — Ambulatory Visit (INDEPENDENT_AMBULATORY_CARE_PROVIDER_SITE_OTHER): Payer: Medicare Other | Admitting: Cardiology

## 2011-11-19 ENCOUNTER — Encounter (HOSPITAL_BASED_OUTPATIENT_CLINIC_OR_DEPARTMENT_OTHER): Payer: Medicare Other | Attending: Internal Medicine

## 2011-11-19 VITALS — BP 110/70 | HR 84 | Ht 77.0 in | Wt 179.0 lb

## 2011-11-19 DIAGNOSIS — L98499 Non-pressure chronic ulcer of skin of other sites with unspecified severity: Secondary | ICD-10-CM | POA: Insufficient documentation

## 2011-11-19 DIAGNOSIS — R0602 Shortness of breath: Secondary | ICD-10-CM

## 2011-11-19 DIAGNOSIS — R600 Localized edema: Secondary | ICD-10-CM

## 2011-11-19 DIAGNOSIS — R609 Edema, unspecified: Secondary | ICD-10-CM

## 2011-11-19 DIAGNOSIS — I4891 Unspecified atrial fibrillation: Secondary | ICD-10-CM

## 2011-11-19 DIAGNOSIS — Z7901 Long term (current) use of anticoagulants: Secondary | ICD-10-CM

## 2011-11-19 DIAGNOSIS — Z9889 Other specified postprocedural states: Secondary | ICD-10-CM

## 2011-11-19 LAB — CBC WITH DIFFERENTIAL/PLATELET
Basophils Absolute: 0 10*3/uL (ref 0.0–0.1)
Eosinophils Absolute: 0.4 10*3/uL (ref 0.0–0.7)
Lymphocytes Relative: 21.9 % (ref 12.0–46.0)
Lymphs Abs: 2 10*3/uL (ref 0.7–4.0)
MCHC: 32.4 g/dL (ref 30.0–36.0)
Monocytes Relative: 12.4 % — ABNORMAL HIGH (ref 3.0–12.0)
Neutro Abs: 5.5 10*3/uL (ref 1.4–7.7)
Platelets: 103 10*3/uL — ABNORMAL LOW (ref 150.0–400.0)
RDW: 17.2 % — ABNORMAL HIGH (ref 11.5–14.6)

## 2011-11-19 LAB — BRAIN NATRIURETIC PEPTIDE: Pro B Natriuretic peptide (BNP): 370 pg/mL — ABNORMAL HIGH (ref 0.0–100.0)

## 2011-11-19 LAB — HEPATIC FUNCTION PANEL
AST: 28 U/L (ref 0–37)
Albumin: 3.1 g/dL — ABNORMAL LOW (ref 3.5–5.2)
Alkaline Phosphatase: 64 U/L (ref 39–117)
Bilirubin, Direct: 0.2 mg/dL (ref 0.0–0.3)
Total Protein: 6.4 g/dL (ref 6.0–8.3)

## 2011-11-19 LAB — BASIC METABOLIC PANEL
CO2: 34 mEq/L — ABNORMAL HIGH (ref 19–32)
Chloride: 101 mEq/L (ref 96–112)
Glucose, Bld: 113 mg/dL — ABNORMAL HIGH (ref 70–99)
Potassium: 3.6 mEq/L (ref 3.5–5.1)
Sodium: 141 mEq/L (ref 135–145)

## 2011-11-19 NOTE — Assessment & Plan Note (Signed)
The patient has not been having any evidence of GI bleeding or other complications from his Coumadin

## 2011-11-19 NOTE — Assessment & Plan Note (Signed)
The patient is not having any symptoms of congestive heart failure.  As peripheral edema may be secondary to his low serum albumin levels.  He is not having any orthopnea or paroxysmal nocturnal dyspnea.

## 2011-11-19 NOTE — Assessment & Plan Note (Signed)
The patient's EKG today shows normal sinus rhythm with frequent premature atrial beats.  He remains on low-dose amiodarone 200 mg daily.

## 2011-11-19 NOTE — Patient Instructions (Signed)
Will obtain labs today and call with the results  Your physician recommends that you continue on your current medications as directed. Please refer to the Current Medication list given to you today.  Your physician recommends that you schedule a follow-up appointment in: 58month

## 2011-11-19 NOTE — Progress Notes (Signed)
[name] [name] Date of Birth:  02/07/1931 [facility] / [facility] [address]   [address] [place], [place]  [address] [phone]           Fax   [phone]  History of Present Illness: This pleasant 78 year old gentleman is seen for a first post hospital office visit following his recent mitral valve repair by Dr. Cornelius Moras.  The [facility] for treatment of a sacral decubitus which has been slow to heal.  The patient had his mitral valve repair on 10/01/11.  Postoperatively he had problems with fluid volume overload with severe peripheral edema.  He also had intermittent atrial fibrillation which responded to amiodarone.  He also had some problems with subcutaneous air leaks and subcutaneous emphysema requiring functioning chest tubes.  Since going to the nursing home his strength is gradually improving.  He continues to have problems with peripheral edema although his weight has been stable.  He had a recent pre-albumin level which was low at 11.2 drawn on 11/11/11.  He has been going to the wound center for intermittent evaluation there.  Patient is back in normal sinus rhythm with PACs.  The plan has been for the patient to remain on warfarin for 3 months postoperatively and then just on aspirin if he remains in sinus rhythm.  Current Outpatient Prescriptions  Medication Sig Dispense Refill  . amiodarone (PACERONE) 200 MG tablet Take 1 tablet (200 mg total) by mouth daily.      Marland Kitchen aspirin EC 81 MG tablet Take 81 mg by mouth daily.        Marland Kitchen atorvastatin (LIPITOR) 10 MG tablet Take 10 mg by mouth daily.        . furosemide (LASIX) 40 MG tablet Take 1 tablet (40 mg total) by mouth 2 (two) times daily.      Marland Kitchen guaiFENesin (MUCINEX) 600 MG 12 hr tablet Take 1 tablet (600 mg total) by mouth 2 (two) times daily.      . metoprolol tartrate (LOPRESSOR) 25 MG tablet Take 1 tablet (25 mg total) by  mouth 2 (two) times daily.      . Multiple Vitamins-Minerals (MULTIVITAMINS THER. W/MINERALS) TABS Take 1 tablet by mouth daily.  30 each    . pantoprazole (PROTONIX) 40 MG tablet Take 1 tablet (40 mg total) by mouth daily before breakfast.      . polyethylene glycol (MIRALAX / GLYCOLAX) packet Take 17 g by mouth daily. As directed       . potassium chloride SA (K-DUR,KLOR-CON) 20 MEQ tablet Take 2 tablets (40 mEq total) by mouth daily.      . RESTASIS 0.05 % ophthalmic emulsion Place 1 drop into both eyes every 12 (twelve) hours.       . sodium chloride (MURO 128) 5 % ophthalmic ointment Place 1 drop into both eyes at bedtime.        . Sodium Chloride, Hypertonic, (MURO 128 OP) Apply 1 drop to eye 2 (two) times daily.       . traZODone (DESYREL) 100 MG tablet Take 1.5 tablets (150 mg total) by mouth at bedtime.      . vitamin C (VITAMIN C) 500 MG tablet Take 1 tablet (500 mg total) by mouth daily.      Marland Kitchen warfarin (COUMADIN) 2.5 MG tablet Take 1 tablet (2.5 mg total) by mouth daily at 6 PM.      .  feeding supplement (ENSURE) PUDG Take 1 Container by mouth 3 (three) times daily with meals.        No Known Allergies  Patient Active Problem List  Diagnoses  . TINEA PEDIS  . ADENOCARCINOMA, PROSTATE  . HYPERCHOLESTEROLEMIA  . THROMBOCYTOPENIA  . DEPRESSION  . HYPERTENSION, BENIGN ESSENTIAL  . GERD  . DERMATITIS, ATOPIC  . CALLUS, LEFT FOOT  . OSTEOARTHRITIS, GENERALIZED, MULTIPLE JOINTS  . BACK PAIN, CHRONIC  . MUSCLE SPASM, BACK  . PERSONAL HISTORY MALIGNANT NEOPLASM PROSTATE  . PERSONAL HISTORY OF MALIGNANT MELANOMA OF SKIN  . ARRHYTHMIA, HX OF  . PERSONAL HISTORY OF COLONIC POLYPS  . Cerumen impaction  . Hearing loss  . Valvular heart disease  . S/P mitral valve repair  . Campath-induced atrial fibrillation  . Pneumothorax, postoperative  . Decubitus skin ulcer  . Pleural effusion due to congestive heart failure  . Encounter for long-term (current) use of anticoagulants     History  Smoking status  . Never Smoker   Smokeless tobacco  . Not on file    History  Alcohol Use No    Family History  Problem Relation Age of Onset  . Clotting disorder Brother     CVA's  . Arthritis Mother   . Hypertension Mother   . Hypertension Father   . Psychosis Father     psychiatric care  . Stroke Mother     Review of Systems: Constitutional: no fever chills diaphoresis or fatigue or change in weight.  Head and neck: no hearing loss, no epistaxis, no photophobia or visual disturbance. Respiratory: No cough, shortness of breath or wheezing. Cardiovascular: No chest pain peripheral edema, palpitations. Gastrointestinal: No abdominal distention, no abdominal pain, no change in bowel habits hematochezia or melena. Genitourinary: No dysuria, no frequency, no urgency, no nocturia. Musculoskeletal:No arthralgias, no back pain, no gait disturbance or myalgias. Neurological: No dizziness, no headaches, no numbness, no seizures, no syncope, no weakness, no tremors. Hematologic: No lymphadenopathy, no easy bruising. Psychiatric: No confusion, no hallucinations, no sleep disturbance.    Physical Exam: Filed Vitals:   11/19/11 1405  BP: 110/70  Pulse: 84   the general appearance reveals a well-developed gentleman in no distress.Pupils equal and reactive.   Extraocular Movements are full.  There is no scleral icterus.  The mouth and pharynx are normal.  The neck is supple.  The carotids reveal no bruits.  The jugular venous pressure is normal.  The thyroid is not enlarged.  There is no lymphadenopathy.  The chest is clear to percussion and auscultation. There are no rales or rhonchi. Expansion of the chest is symmetrical.  Heart reveals no mitral regurgitant murmur.  Rhythm is regular with PACsThe abdomen is soft and nontender. Bowel sounds are normal. The liver and spleen are not enlarged. There Are no abdominal masses. There are no bruits.  Extremities reveal 2+  bilateral pedal edema.Strength is normal and symmetrical in all extremities.  There is no lateralizing weakness.  There are no sensory deficits.  The sacral decubitus was not examined.  EKG shows normal sinus rhythm with premature atrial beats and nonspecific T-wave flattening   Assessment / Plan: The patient is concerned about his ankle edema.  He is already on furosemide.  To help determine whether he isn't having evidence of congestive heart failure we will get a BE natruretic peptide as well as a CBC and a comprehensive metabolic panel.  I suspect that a lot of his edema his nutritional and is related  to his low serum albumin levels.  He is trying to eat protein and he is on Ensure shakes.  We will plan to keep his amiodarone at the present dose for another month and see him back in one month for followup office visit and EKG.  He will be on warfarin for 3 months post mitral valve repair.

## 2011-11-20 ENCOUNTER — Other Ambulatory Visit: Payer: Self-pay | Admitting: Thoracic Surgery (Cardiothoracic Vascular Surgery)

## 2011-11-20 DIAGNOSIS — I059 Rheumatic mitral valve disease, unspecified: Secondary | ICD-10-CM

## 2011-11-20 NOTE — Progress Notes (Signed)
[facility]  NAME:  [name] [name], [name] [name]               ACCOUNT NO.:  [id number]  MEDICAL RECORD NO.:  [id number]      DATE OF BIRTH:  10-05-1931  PHYSICIAN:  Wayland Denis, DO       VISIT DATE:  11/19/2011                                  OFFICE VISIT   [name] [name] is a 78 year old gentleman who is here with her daughter for followup on his sacral ischial ulcer.  He has been dealing with this for some time and was using Santyl hydrogel ATO.  We checked his pre-albumin unfortunately that came back in the 11 range, so it shows he does not have a nutrition that he needs and we had a long discussion about this. He has also not been off of that for the majority of the day.  He has been going to physical therapy and riding the bike as well.  So there was some fibrous tissue and some necrotic tissue that needed to be debrided.  There is no change in his social history.  He is still at the rehab facility.  He is not smoking.  He is not on the Cipro anymore.  PHYSICAL EXAMINATION:  GENERAL:  On exam, he is alert, oriented, cooperative.  He seems to be a good historian and aware of his situation.  His daughter is present and very helpful. HEENT:  His pupils are equal.  Extraocular muscles are intact.  No cervical lymphadenopathy. EXTREMITIES:  His upper extremity pulses are strong and regular.  Lower extremity, he has got shoes on and compression stockings.  On the wound area, there was some quite a bit of nonviable tissue.  He is on Coumadin so this was debrided sharply, but care was taken not to stir up any bleeding.  A time-out was called.  All information was confirmed to be correct and this was done with a 15-blade and a pair of pickups down to and including subcutaneous tissue.  We will switch him to wet-to-dry with Dakin's solution just for the week and then hopefully we can initiate VAC therapy.  We will also refer him to nutritionist for consultation to increase his  prealbumin.  We will recommend vitamin C, zinc, and multivitamin on a daily basis and stressed the importance of him staying off of the area.     Wayland Denis, DO     CS/MEDQ  D:  11/19/2011  T:  11/20/2011  Job:  [id number]

## 2011-11-21 ENCOUNTER — Telehealth: Payer: Self-pay | Admitting: *Deleted

## 2011-11-21 NOTE — Telephone Encounter (Signed)
Patient already on K+ 20 meq 2 daily, will increase to 3 daily per  Dr. Patty Sermons. Advised daughter and faxed to [name]

## 2011-11-21 NOTE — Telephone Encounter (Signed)
Message copied by Burnell Blanks on Fri Nov 21, 2011  1:52 PM ------      Message from: Cassell Clement      Created: Wed Nov 19, 2011  8:59 PM       K is borderline low so add KDur 20 meq daily.      BNP is better but still slightly high.  Increase Lasix to 80 mg in am and 40 mg in pm.  This should help the edema. The anemia is better and the albumen level is better.      Recheck BMET at next OV

## 2011-11-24 ENCOUNTER — Encounter: Payer: Self-pay | Admitting: Thoracic Surgery (Cardiothoracic Vascular Surgery)

## 2011-11-24 ENCOUNTER — Ambulatory Visit (INDEPENDENT_AMBULATORY_CARE_PROVIDER_SITE_OTHER): Payer: Self-pay | Admitting: Thoracic Surgery (Cardiothoracic Vascular Surgery)

## 2011-11-24 ENCOUNTER — Ambulatory Visit
Admission: RE | Admit: 2011-11-24 | Discharge: 2011-11-24 | Disposition: A | Discharge status: Home / Self Care | Payer: Medicare Other | Source: Ambulatory Visit | Attending: Thoracic Surgery (Cardiothoracic Vascular Surgery) | Admitting: Thoracic Surgery (Cardiothoracic Vascular Surgery)

## 2011-11-24 VITALS — BP 125/78 | HR 88 | Resp 18

## 2011-11-24 DIAGNOSIS — Z9889 Other specified postprocedural states: Secondary | ICD-10-CM

## 2011-11-24 DIAGNOSIS — L89159 Pressure ulcer of sacral region, unspecified stage: Secondary | ICD-10-CM

## 2011-11-24 DIAGNOSIS — L8990 Pressure ulcer of unspecified site, unspecified stage: Secondary | ICD-10-CM

## 2011-11-24 DIAGNOSIS — I059 Rheumatic mitral valve disease, unspecified: Secondary | ICD-10-CM

## 2011-11-24 DIAGNOSIS — L89109 Pressure ulcer of unspecified part of back, unspecified stage: Secondary | ICD-10-CM

## 2011-11-24 DIAGNOSIS — Z09 Encounter for follow-up examination after completed treatment for conditions other than malignant neoplasm: Secondary | ICD-10-CM

## 2011-11-24 NOTE — Progress Notes (Signed)
[address]            [name] [address]          [phone]     CARDIOTHORACIC SURGERY OFFICE NOTE  Primary Cardiologist is Cassell Clement, MD Referring Provider is [place], Peter, MD PCP is Thomos Lemons, DO, DO   HPI:  Patient returns for follow-up following mitral valve repair on 10/01/2011.  His postoperative course was slow and complex with numerous postoperative complications including postoperative pneumothorax, atrial fibrillation, congestive heart failure, and the development of bilateral decubitus ulcerations overlying the ischial tuberosity of both buttocks, more so on the left than the right.  He ultimately was discharged to a skilled nursing facility at the [facility] where he has been recovering over the last few weeks.  His decubitus ulceration has been followed and managed by Dr. Kelly Splinter [facility].  His coumadin dose and medical therapy for congestive heart failure have been managed by Dr. Patty Sermons who saw [name] [name] in the office last week.  Over the last few days [name] [name] has been started on Bactrim DS for possible pneumonia by one of the physicians covering at the [facility].  The patient seems to be gradually improving. He has made considerable progress since hospital discharge with respect to his exercise tolerance and mobility. He states that he physical therapists have told him that he would probably be ready to go home to independent living soon once his decubitus ulceration has been resolved. He is indwelling with walker without much difficulty and think he is probably ready to start ambulating using only a cane. He still has exertional shortness of breath, but this has improved in comparison with how he was doing at the time of hospital discharge. His nutritional status remains a problem. He states that he just doesn't have much of an appetite despite the fact that is good. Blood work performed  recently is notable for low protein levels consistent with persistent protein calorie depletion. He has made good progress with respect to the wound. For his decubitus ulceration and plans to be converted to a wound vac sometime this week. He denies any fevers or chills. He has had a nonproductive cough the last few days she thinks may be improved. He denies resting shortness of breath. He is mild bilateral lower extremity edema. The remainder of his review of systems unremarkable.   Current Outpatient Prescriptions  Medication Sig Dispense Refill  . amiodarone (PACERONE) 200 MG tablet Take 1 tablet (200 mg total) by mouth daily.      Marland Kitchen aspirin EC 81 MG tablet Take 81 mg by mouth daily.        Marland Kitchen atorvastatin (LIPITOR) 10 MG tablet Take 10 mg by mouth daily.        . feeding supplement (ENSURE) PUDG Take 1 Container by mouth 3 (three) times daily with meals.      Marland Kitchen guaiFENesin (MUCINEX) 600 MG 12 hr tablet Take 1 tablet (600 mg total) by mouth 2 (two) times daily.      . metoprolol tartrate (LOPRESSOR) 25 MG tablet Take 1 tablet (25 mg total) by mouth 2 (two) times daily.      . Multiple Vitamins-Minerals (MULTIVITAMINS THER. W/MINERALS) TABS Take 1 tablet by mouth daily.  30 each    . pantoprazole (PROTONIX) 40 MG tablet Take 1 tablet (40 mg total)  by mouth daily before breakfast.      . polyethylene glycol (MIRALAX / GLYCOLAX) packet Take 17 g by mouth daily. As directed       . RESTASIS 0.05 % ophthalmic emulsion Place 1 drop into both eyes every 12 (twelve) hours.       . sodium chloride (MURO 128) 5 % ophthalmic ointment Place 1 drop into both eyes at bedtime.        . Sodium Chloride, Hypertonic, (MURO 128 OP) Apply 1 drop to eye 2 (two) times daily.       Marland Kitchen sulfamethoxazole-trimethoprim (BACTRIM DS) 800-160 MG per tablet Take 1 tablet by mouth 2 (two) times daily.        . vitamin C (VITAMIN C) 500 MG tablet Take 1 tablet (500 mg total) by mouth daily.      Marland Kitchen warfarin (COUMADIN) 2.5 MG  tablet Take 1 tablet (2.5 mg total) by mouth daily at 6 PM.      . furosemide (LASIX) 40 MG tablet Take 40 mg by mouth as directed. 2 in the morning and 1 in the evening       . potassium chloride SA (K-DUR,KLOR-CON) 20 MEQ tablet Take 20 mEq by mouth 3 (three) times daily.       . traZODone (DESYREL) 100 MG tablet Take 1.5 tablets (150 mg total) by mouth at bedtime.          Physical Exam:   BP 125/78  Pulse 88  Resp 18  SpO2 97%  General:  Well appearing  Chest:   Clear to auscultation with slightly diminished breath sounds at both lung bases. All of the surgical incisions have healed nicely and the sternum is stable to palpation  CV:   Regular rate and rhythm with no murmur  Abdomen:  Soft and nontender  Extremities:  Warm and well-perfused with mild bilateral lower extremity edema.  The decubitus ulceration is clean and granulating nicely  Diagnostic Tests:  Chest x-ray performed today demonstrates small bilateral pleural effusions and bibasilar pulmonary opacity. Overall the appearance of this x-ray has improved in comparison with the last x-ray performed prior to hospital discharge. There is nothing to suggest the presence of ongoing pneumonia and in fact the pulmonary parenchymal opacity in the right lung has matured and undoubtedly is related to the patient's recent surgery   Impression:  Overall the patient seems to be making slow but acceptable progress. He is maintaining sinus rhythm. He still requires diuretic therapy for management of chronic systolic congestive heart failure with a tendency for fluid retention. This is expected and will perhaps slowly improve over time. The patient's exercise tolerance is slowly improving. His appetite is still poor and perhaps might be affected by amiodarone therapy. His decubitus ulceration is clean, granulating nicely, and appears to be healing well so far. His physical mobility has continued to gradually improve  Plan:  I favor  stopping amiodarone therapy to see if this will help the patient's appetite improve. He has not had any irregular heart rhythms for more than 4 weeks. Furthermore, the patient has recently been started on Bactrim. In the setting of concurrent therapy with amiodarone and Coumadin this seems unwise. We have otherwise not made any changes in the patient's therapy at this time. I've encouraged him to continue to gradually increase his physical activity as tolerated. Although he has made considerable improvement, I would favor keeping him at the rehabilitation center until his decubitus ulceration has either completely healed or made  considerable additional progress. I would worry that this might become more difficult to manage without the level of attention he is getting currently on a daily basis. We will plan to see the patient back in 4 weeks with a repeat chest x-ray. All the questions of the patient and his daughter have been addressed.   Salvatore Decent. Cornelius Moras, MD 11/24/2011 1:14 PM

## 2011-11-24 NOTE — Patient Instructions (Signed)
Stop Amiodarone.  Continue to increase physical activity as tolerated. The patient has been reminded to continue to avoid any heavy lifting (>10 lbs) or strenuous use of arms or shoulders for at least a total of three months from the time of surgery.

## 2011-11-27 ENCOUNTER — Telehealth: Payer: Self-pay | Admitting: Cardiology

## 2011-11-27 ENCOUNTER — Encounter (HOSPITAL_BASED_OUTPATIENT_CLINIC_OR_DEPARTMENT_OTHER): Payer: Medicare Other

## 2011-11-27 NOTE — Telephone Encounter (Signed)
Does feel tired and not eating like he should.  Swelling better but weight down. No change in breathing, dry cough but recent visit with Dr Stephens Shire ok. Please advise

## 2011-11-27 NOTE — Telephone Encounter (Signed)
Advised daughter and will fax to [employer]

## 2011-11-27 NOTE — Telephone Encounter (Signed)
Pt's dtr calling re upping lasix at last appt 11-19-11,  has lost to much weight, was 179 lbs down 172lbs within 1 week, not eating much

## 2011-11-27 NOTE — Telephone Encounter (Signed)
New Problem   Patient daughter Darl Pikes returned call to Nurse Juliette Alcide.

## 2011-11-27 NOTE — Telephone Encounter (Signed)
Left message

## 2011-11-27 NOTE — Progress Notes (Signed)
[facility]  NAME:  [name], [name]               ACCOUNT NO.:  [id number]  MEDICAL RECORD NO.:  [id number]      DATE OF BIRTH:  1931-10-31  PHYSICIAN:  Wayland Denis, DO       VISIT DATE:  11/26/2011                                  OFFICE VISIT   [name] [name] is a 78 year old white male, who is here for followup on a sacral ulcer.  He has been using Dakin's wet-to-dry dressing changes twice a day over the past week with very good improvement.  There is less fibrous tissue, more granulating tissue.  No sign of active drainage or infection.  There is no change in his review of systems.  SOCIAL HISTORY:  He is nonsmoker and still at a care facility.  No change in his medications.  PHYSICAL EXAMINATION:  GENERAL:  He is alert, oriented, cooperative, not in any acute distress. HEENT:  He has a little bit of a hard time hearing, but he is a good historian.  His pupils are equal.  Extraocular muscles are intact. NECK:  No cervical lymphadenopathy. CHEST:  Breathing is unlabored. HEART:  Regular. ABDOMEN:  No abdominal tenderness, slightly tender in the sacral area where the wound is located.  Wound is granulating and improving in its overall appearance.  We would like to switch him to normal saline, wet-to-dry dressing changes twice a day.  Continue offloading multivitamin, zinc, and vitamin C, and we have ordered the [facility] and awaiting to hear back.     Wayland Denis, DO     CS/MEDQ  D:  11/26/2011  T:  11/27/2011  Job:  [id number]

## 2011-11-27 NOTE — Telephone Encounter (Signed)
Try decreasing Lasix back to 40 mg po BID and see if appetite and strength improve.

## 2011-12-05 ENCOUNTER — Telehealth: Payer: Self-pay | Admitting: Oncology

## 2011-12-05 NOTE — Telephone Encounter (Signed)
pt called from rehab as he had hrt surg,r/s his 1/22 appt to 3/25   aom

## 2011-12-09 ENCOUNTER — Ambulatory Visit: Payer: Medicare Other | Admitting: Oncology

## 2011-12-09 ENCOUNTER — Other Ambulatory Visit: Payer: Medicare Other | Admitting: Lab

## 2011-12-11 NOTE — Progress Notes (Signed)
[facility]  NAME:  [name] [name], [name] [name]                    ACCOUNT NO.:  MEDICAL RECORD NO.:  [id number]      DATE OF BIRTH:  [birth year]  PHYSICIAN:  Wayland Denis, DO       VISIT DATE:  12/10/2011                                  OFFICE VISIT   [name] [name] is a 78 year old gentleman who is here with his daughter for a followup on his sacral ischial ulcer.  He has been using wet-to-dry dressing changes, and he is still in a rehab facility.  They have been doing them twice a day.  There is some improvement superficially, but it still tracks distally.  He has some periwound irritation as well.  There has been no change in his medications or social history.  On exam, he is alert and cooperative.  He is having a bit of difficulty understanding the plan and I am not sure if that is because the plan is a little bit difficult to understand or fit him, but he does express wanting to go home.  His pupils are equal.  Extraocular muscles are intact.  No cervical lymphadenopathy.  His breathing is unlabored. Heart is regular.  His abdomen is soft.  His left ischial sacral area has the wound that I have described in the nurse's note.  There is no malodor noted today or excessive drainage noted at present, but certainly, there is a history of it.  I would like him to use alginate at least once a day and as soon as he gets home, I would like to apply for the back.  He also will need a specialty bed when he goes home and needs to stay off that area to decrease the pressure.     Wayland Denis, DO     CS/MEDQ  D:  12/10/2011  T:  12/11/2011  Job:  [id number]

## 2011-12-18 ENCOUNTER — Ambulatory Visit (INDEPENDENT_AMBULATORY_CARE_PROVIDER_SITE_OTHER): Payer: Self-pay | Admitting: Internal Medicine

## 2011-12-18 DIAGNOSIS — R0989 Other specified symptoms and signs involving the circulatory and respiratory systems: Secondary | ICD-10-CM

## 2011-12-18 DIAGNOSIS — I4891 Unspecified atrial fibrillation: Secondary | ICD-10-CM

## 2011-12-18 DIAGNOSIS — Z7901 Long term (current) use of anticoagulants: Secondary | ICD-10-CM

## 2011-12-18 MED ORDER — WARFARIN SODIUM 3 MG PO TABS: 3.0000 mg | ORAL_TABLET | Freq: Every day | ORAL | Status: DC

## 2011-12-22 ENCOUNTER — Ambulatory Visit: Payer: Medicare Other | Admitting: Thoracic Surgery (Cardiothoracic Vascular Surgery)

## 2011-12-22 ENCOUNTER — Other Ambulatory Visit (INDEPENDENT_AMBULATORY_CARE_PROVIDER_SITE_OTHER): Payer: Medicare Other | Admitting: *Deleted

## 2011-12-22 ENCOUNTER — Ambulatory Visit (INDEPENDENT_AMBULATORY_CARE_PROVIDER_SITE_OTHER): Payer: Medicare Other | Admitting: Cardiology

## 2011-12-22 ENCOUNTER — Encounter: Payer: Self-pay | Admitting: Cardiology

## 2011-12-22 VITALS — BP 131/79 | HR 83 | Ht 77.0 in | Wt 183.0 lb

## 2011-12-22 DIAGNOSIS — K5900 Constipation, unspecified: Secondary | ICD-10-CM

## 2011-12-22 DIAGNOSIS — E7800 Pure hypercholesterolemia, unspecified: Secondary | ICD-10-CM

## 2011-12-22 DIAGNOSIS — Z79899 Other long term (current) drug therapy: Secondary | ICD-10-CM

## 2011-12-22 DIAGNOSIS — I4891 Unspecified atrial fibrillation: Secondary | ICD-10-CM

## 2011-12-22 DIAGNOSIS — Z9889 Other specified postprocedural states: Secondary | ICD-10-CM

## 2011-12-22 DIAGNOSIS — L8990 Pressure ulcer of unspecified site, unspecified stage: Secondary | ICD-10-CM

## 2011-12-22 LAB — BASIC METABOLIC PANEL
BUN: 20 mg/dL (ref 6–23)
Calcium: 9.2 mg/dL (ref 8.4–10.5)
GFR: 79.41 mL/min (ref >60.00)
Glucose, Bld: 90 mg/dL (ref 70–99)
Potassium: 4.1 mEq/L (ref 3.5–5.1)

## 2011-12-22 MED ORDER — FUROSEMIDE 40 MG PO TABS: ORAL_TABLET | ORAL | Status: DC

## 2011-12-22 MED ORDER — METOPROLOL TARTRATE 25 MG PO TABS: 25.0000 mg | ORAL_TABLET | Freq: Two times a day (BID) | ORAL | Status: DC

## 2011-12-22 MED ORDER — POTASSIUM CHLORIDE CRYS ER 20 MEQ PO TBCR: 20.0000 meq | EXTENDED_RELEASE_TABLET | Freq: Three times a day (TID) | ORAL | Status: DC

## 2011-12-22 NOTE — Patient Instructions (Signed)
Your physician recommends that you continue on your current medications as directed. Please refer to the Current Medication list given to you today. Your physician recommends that you schedule a follow-up appointment in: 6 weeks ov/ekg Will obtain labs today and call you with the results (bmet

## 2011-12-22 NOTE — Assessment & Plan Note (Signed)
The patient has not been aware of any tachycardia or palpitations.  He sleeps on 2 pillows which is normal for him.  His weight is improving from better nutrition rather than more fluid retention.  He still has trace tibial edema.

## 2011-12-22 NOTE — Progress Notes (Signed)
[name] [name] Date of Birth:  05/22/1931 [facility] [address] [place], [place]  [address] [phone]         Fax   [phone]  History of Present Illness: This this pleasant 78 year old gentleman is seen for a scheduled followup office visit.  He has a past history of severe mitral valve prolapse.  He had mitral valve repair by Dr. Cornelius Moras on 10/01/11.  Postoperatively he had problems with fluid overload and prolonged hospitalization and also developed a sacral ulcer.  The ulcer has been slow to heal and he has a drain in place and the home health nurses checking on him regularly.  Patient has a history of intermittent atrial fibrillation which has responded to amiodarone and his rhythm today is regular.  He has not been experiencing any signs or symptoms of acute congestive heart failure.  Once he is a little more mobile he is looking forward to join in the outpatient cardiac rehabilitation program.  If he remains in normal sinus rhythm we will anticipate that he would eventually go back on aspirin alone.  Current Outpatient Prescriptions  Medication Sig Dispense Refill  . atorvastatin (LIPITOR) 10 MG tablet Take 10 mg by mouth daily.        . feeding supplement (ENSURE) PUDG Take 1 Container by mouth 3 (three) times daily with meals.      . furosemide (LASIX) 40 MG tablet 2 TAB AM AND 1 TAB PO QHS  270 tablet  3  . guaiFENesin (MUCINEX) 600 MG 12 hr tablet Take 1 tablet (600 mg total) by mouth 2 (two) times daily.      . metoprolol tartrate (LOPRESSOR) 25 MG tablet Take 1 tablet (25 mg total) by mouth 2 (two) times daily.  180 tablet  3  . Multiple Vitamins-Minerals (MULTIVITAMINS THER. W/MINERALS) TABS Take 1 tablet by mouth daily.  30 each    . pantoprazole (PROTONIX) 40 MG tablet Take 1 tablet (40 mg total) by mouth daily before breakfast.      . polyethylene glycol (MIRALAX / GLYCOLAX) packet Take 17 g by mouth daily. As directed       . potassium chloride SA  (K-DUR,KLOR-CON) 20 MEQ tablet Take 1 tablet (20 mEq total) by mouth 3 (three) times daily.  270 tablet  3  . RESTASIS 0.05 % ophthalmic emulsion Place 1 drop into both eyes every 12 (twelve) hours.       . sodium chloride (MURO 128) 5 % ophthalmic ointment Place 1 drop into both eyes at bedtime.        . Sodium Chloride, Hypertonic, (MURO 128 OP) Apply 1 drop to eye 2 (two) times daily.       . traZODone (DESYREL) 100 MG tablet Take 1.5 tablets (150 mg total) by mouth at bedtime.      . vitamin C (VITAMIN C) 500 MG tablet Take 1 tablet (500 mg total) by mouth daily.      Marland Kitchen warfarin (COUMADIN) 3 MG tablet Take 1 tablet (3 mg total) by mouth daily.  30 tablet  2  . Zinc Sulfate (ZINC-220 PO) Take 1 tablet by mouth daily.        No Known Allergies  Patient Active Problem List  Diagnoses  . TINEA PEDIS  . ADENOCARCINOMA, PROSTATE  . HYPERCHOLESTEROLEMIA  . THROMBOCYTOPENIA  . DEPRESSION  . HYPERTENSION, BENIGN ESSENTIAL  . GERD  . DERMATITIS, ATOPIC  . CALLUS, LEFT FOOT  . OSTEOARTHRITIS, GENERALIZED, MULTIPLE JOINTS  .  BACK PAIN, CHRONIC  . MUSCLE SPASM, BACK  . PERSONAL HISTORY MALIGNANT NEOPLASM PROSTATE  . PERSONAL HISTORY OF MALIGNANT MELANOMA OF SKIN  . ARRHYTHMIA, HX OF  . PERSONAL HISTORY OF COLONIC POLYPS  . Cerumen impaction  . Hearing loss  . Valvular heart disease  . S/P mitral valve repair  . Atrial fibrillation  . Pneumothorax, postoperative  . Decubitus skin ulcer  . Pleural effusion due to congestive heart failure  . Encounter for long-term (current) use of anticoagulants  . Hx of mitral valve repair    History  Smoking status  . Never Smoker   Smokeless tobacco  . Not on file    History  Alcohol Use No    Family History  Problem Relation Age of Onset  . Clotting disorder Brother     CVA's  . Arthritis Mother   . Hypertension Mother   . Hypertension Father   . Psychosis Father     psychiatric care  . Stroke Mother     Review of  Systems: Constitutional: no fever chills diaphoresis or fatigue or change in weight.  Head and neck: no hearing loss, no epistaxis, no photophobia or visual disturbance. Respiratory: No cough, shortness of breath or wheezing. Cardiovascular: No chest pain peripheral edema, palpitations. Gastrointestinal: No abdominal distention, no abdominal pain, no change in bowel habits hematochezia or melena. Genitourinary: No dysuria, no frequency, no urgency, no nocturia. Musculoskeletal:No arthralgias, no back pain, no gait disturbance or myalgias. Neurological: No dizziness, no headaches, no numbness, no seizures, no syncope, no weakness, no tremors. Hematologic: No lymphadenopathy, no easy bruising. Psychiatric: No confusion, no hallucinations, no sleep disturbance.    Physical Exam: Filed Vitals:   12/22/11 1132  BP: 131/79  Pulse: 83   the general appearance reveals a tall gentleman in no distress.Pupils equal and reactive.   Extraocular Movements are full.  There is no scleral icterus.  The mouth and pharynx are normal.  The neck is supple.  The carotids reveal no bruits.  The jugular venous pressure is normal.  The thyroid is not enlarged.  There is no lymphadenopathy.  The chest is clear to percussion and auscultation. There are no rales or rhonchi. Expansion of the chest is symmetrical.  Heart reveals no murmur of mitral regurgitation.  The sternal incision is well-healed.  There is no pericardial friction rub. The abdomen is soft and nontender. Bowel sounds are normal. The liver and spleen are not enlarged. There Are no abdominal masses. There are no bruits.  The pedal pulses are good.  There is no phlebitis or edema.  There is no cyanosis or clubbing. Strength is normal and symmetrical in all extremities.  There is no lateralizing weakness.  There are no sensory deficits.  The skin is warm and dry.  There is no rash.  We did not check the sacral area where he has his  decubitus.    Assessment / Plan: Continue on same medication.  He may stop his Mucinex nail.  He may also stop his proton pump inhibitor since he is no longer having any dyspepsia.  He is having constipation in the low use MiraLax as necessary for that.  We will plan to see him in 6 weeks for followup office visit and EKG

## 2011-12-22 NOTE — Assessment & Plan Note (Signed)
The patient reports that his decubitus ulcer is slowly improving.  Once he has healed he would be a candidate for outpatient cardiac rehabilitation.  He is still essentially homebound because of the sacral decubitus and the home health nurses checking on him.

## 2011-12-22 NOTE — Assessment & Plan Note (Signed)
The patient has been on a low dose atorvastatin.  Is not having a side effect from the atorvastatin.

## 2011-12-23 ENCOUNTER — Telehealth: Payer: Self-pay | Admitting: Cardiology

## 2011-12-23 ENCOUNTER — Telehealth: Payer: Self-pay | Admitting: *Deleted

## 2011-12-23 NOTE — Telephone Encounter (Signed)
Advised of labs 

## 2011-12-23 NOTE — Telephone Encounter (Signed)
New Problem   Patient returning nurse MP call, he can be reached at hm# [phone]

## 2011-12-23 NOTE — Telephone Encounter (Signed)
Message copied by Burnell Blanks on Tue Dec 23, 2011  4:31 PM ------      Message from: Cassell Clement      Created: Mon Dec 22, 2011  9:13 PM       Please report.  The labs are stable.  Continue same meds.  Continue careful diet.

## 2011-12-24 ENCOUNTER — Other Ambulatory Visit: Payer: Self-pay | Admitting: Thoracic Surgery (Cardiothoracic Vascular Surgery)

## 2011-12-24 ENCOUNTER — Encounter (HOSPITAL_BASED_OUTPATIENT_CLINIC_OR_DEPARTMENT_OTHER): Payer: Medicare Other | Attending: Internal Medicine

## 2011-12-24 DIAGNOSIS — I059 Rheumatic mitral valve disease, unspecified: Secondary | ICD-10-CM

## 2011-12-24 DIAGNOSIS — L98499 Non-pressure chronic ulcer of skin of other sites with unspecified severity: Secondary | ICD-10-CM | POA: Insufficient documentation

## 2011-12-25 NOTE — Progress Notes (Signed)
[facility]  NAME:  [name] [name], [name] [name] NO.:  [id number]  MEDICAL RECORD NO.:  [id number]      DATE OF BIRTH:  02-17-1931  PHYSICIAN:  Wayland Denis, DO       VISIT DATE:  12/24/2011                                  OFFICE VISIT   [name] [name] is a 78 year old gentleman who is here with his daughter for followup on his left sacral ulcer.  He has been using the [facility] for the past week with very good improvement.  The superficial aspect of the wound is granulating nicely.  There is still a little bit of a hole that is tracking down, but it does not appear infected or draining, and the hardness in the periwound area has improved.  There is no change in his medications.  SOCIAL HISTORY:  He is now at home with [name] [name] and [name] [name] and was discharged from the nursing [name].  PHYSICAL EXAMINATION:  GENERAL:  He is alert, oriented, cooperative.  He is pleasant.  He seems a little bit confused today where having to repeat the plan of care several times for him to grasp it, but overall, he is doing well. HEENT:  Pupils are equal.  Extraocular muscles are intact. NECK:  No cervical lymphadenopathy. CHEST:  His breathing is unlabored. HEART:  His heart rate is regular. ABDOMEN:  Soft. SKIN:  The wound has improved.  No sign of overt infection and it seems to be granulating well.  We will continue with the VAC and switch to the white dressing foam and see him in followup.     Wayland Denis, DO     CS/MEDQ  D:  12/24/2011  T:  12/25/2011  Job:  [id number]

## 2011-12-26 ENCOUNTER — Ambulatory Visit (INDEPENDENT_AMBULATORY_CARE_PROVIDER_SITE_OTHER): Payer: Self-pay | Admitting: Internal Medicine

## 2011-12-26 DIAGNOSIS — R0989 Other specified symptoms and signs involving the circulatory and respiratory systems: Secondary | ICD-10-CM

## 2011-12-26 DIAGNOSIS — I4891 Unspecified atrial fibrillation: Secondary | ICD-10-CM

## 2011-12-26 DIAGNOSIS — Z7901 Long term (current) use of anticoagulants: Secondary | ICD-10-CM

## 2011-12-26 LAB — POCT INR: INR: 1.7

## 2011-12-29 ENCOUNTER — Ambulatory Visit
Admission: RE | Admit: 2011-12-29 | Discharge: 2011-12-29 | Disposition: A | Discharge status: Home / Self Care | Payer: Medicare Other | Source: Ambulatory Visit | Attending: Thoracic Surgery (Cardiothoracic Vascular Surgery) | Admitting: Thoracic Surgery (Cardiothoracic Vascular Surgery)

## 2011-12-29 ENCOUNTER — Ambulatory Visit (INDEPENDENT_AMBULATORY_CARE_PROVIDER_SITE_OTHER): Payer: Self-pay | Admitting: Thoracic Surgery (Cardiothoracic Vascular Surgery)

## 2011-12-29 ENCOUNTER — Encounter: Payer: Self-pay | Admitting: Thoracic Surgery (Cardiothoracic Vascular Surgery)

## 2011-12-29 VITALS — BP 125/71 | HR 89 | Resp 16 | Ht 77.0 in | Wt 186.0 lb

## 2011-12-29 DIAGNOSIS — I059 Rheumatic mitral valve disease, unspecified: Secondary | ICD-10-CM

## 2011-12-29 DIAGNOSIS — Z9889 Other specified postprocedural states: Secondary | ICD-10-CM

## 2011-12-29 DIAGNOSIS — Z09 Encounter for follow-up examination after completed treatment for conditions other than malignant neoplasm: Secondary | ICD-10-CM

## 2011-12-29 NOTE — Patient Instructions (Signed)
Continue to monitor weight every day and keep a eye on the amount of swelling around her ankles. Report any further increase in weight or swelling to Dr. Yevonne Pax office to consider increasing dose of diuretic therapy.  The patient has been reminded to continue to avoid any heavy lifting or strenuous use of arms or shoulders for at least a total of three months from the time of surgery.

## 2011-12-29 NOTE — Progress Notes (Signed)
[address]            [name] [address]          [phone]     CARDIOTHORACIC SURGERY OFFICE NOTE  Referring Provider is Cassell Clement, MD PCP is Thomos Lemons, DO, DO   HPI:  Patient returns for follow-up s/p mitral valve repair.  He was last seen here in the office on 11/24/2011. Since then he has continued to improve. He is now home and physically getting around fairly well. He continues to followup in the wound care clinic for his decubitus ulceration which is now being treated using a wound VAC. He states that his strength continues to gradually improve and he is ambulating every day. He's not having any shortness of breath. He has mild occasional residual pains across his chest related to the sternotomy, but this has not required any sort of pain relievers. His appetite is improving he states that he seems to be getting stronger. Since he is been home for the past 2 weeks he notes that his weight has gradually increased more than 10 pounds. He is having some lower extremity edema. The remainder of his review of systems is unremarkable.   Current Outpatient Prescriptions  Medication Sig Dispense Refill  . atorvastatin (LIPITOR) 10 MG tablet Take 10 mg by mouth daily.        . feeding supplement (ENSURE) PUDG Take 1 Container by mouth 3 (three) times daily with meals.      . furosemide (LASIX) 40 MG tablet 2 TAB AM AND 1 TAB PO QHS  270 tablet  3  . metoprolol tartrate (LOPRESSOR) 25 MG tablet Take 1 tablet (25 mg total) by mouth 2 (two) times daily.  180 tablet  3  . Multiple Vitamins-Minerals (MULTIVITAMINS THER. W/MINERALS) TABS Take 1 tablet by mouth daily.  30 each    . potassium chloride SA (K-DUR,KLOR-CON) 20 MEQ tablet Take 1 tablet (20 mEq total) by mouth 3 (three) times daily.  270 tablet  3  . RESTASIS 0.05 % ophthalmic emulsion Place 1 drop into both eyes every 12 (twelve) hours.       . sodium chloride (MURO 128) 5 % ophthalmic  ointment Place 1 drop into both eyes at bedtime.        . Sodium Chloride, Hypertonic, (MURO 128 OP) Apply 1 drop to eye 2 (two) times daily.       . traZODone (DESYREL) 100 MG tablet Take 1.5 tablets (150 mg total) by mouth at bedtime.      Marland Kitchen warfarin (COUMADIN) 3 MG tablet Take 1 tablet (3 mg total) by mouth daily.  30 tablet  2  . Zinc Sulfate (ZINC-220 PO) Take 1 tablet by mouth daily.      Marland Kitchen guaiFENesin (MUCINEX) 600 MG 12 hr tablet Take 1 tablet (600 mg total) by mouth 2 (two) times daily.      . pantoprazole (PROTONIX) 40 MG tablet Take 1 tablet (40 mg total) by mouth daily before breakfast.      . polyethylene glycol (MIRALAX / GLYCOLAX) packet Take 17 g by mouth daily. As directed       . vitamin C (VITAMIN C) 500 MG tablet Take 1 tablet (500 mg total) by mouth daily.          Physical Exam:   BP 125/71  Pulse 89  Resp 16  Ht  6\' 5"  (1.956 m)  Wt 186 lb (84.369 kg)  BMI 22.06 kg/m2  SpO2 94%  General:  Well-appearing  Chest:   Sternotomy is well healed. Breath sounds are clear to auscultation  CV:   Regular rate and rhythm without murmur  Abdomen:  Soft and nontender  Extremities:  Warm and well-perfused with mild to moderate bilateral lower extremity edema at the ankle  Diagnostic Tests:  Chest x-ray performed today demonstrates small bilateral pleural effusions and mild pulmonary vascular congestion which appears essentially stable.   Impression:  Patient continues to gradually improve overall. His weight has been trending up to the past 2 weeks he has some lower extremity edema. Overall he seems to be doing fairly well.  Plan:  I've advised the patient to keep a close eye on his weight and to the degree of lower extremity edema. His weight continues to trend up and the swelling gets any worse he may need to increase his dose of Lasix. I've encouraged him to continue to increase his physical activity as tolerated although he should continue to refrain from any sort of  heavy lifting or strenuous use of his arms or shoulders. He has not yet ready to drive normal be a penny continues to need home health therapy for management of his wound VAC. We will plan to see the patient back for further followup in 2 months.   [name]. Cornelius Moras, MD 12/29/2011 12:56 PM

## 2012-01-02 ENCOUNTER — Ambulatory Visit (INDEPENDENT_AMBULATORY_CARE_PROVIDER_SITE_OTHER): Payer: Self-pay | Admitting: Cardiology

## 2012-01-02 DIAGNOSIS — I4891 Unspecified atrial fibrillation: Secondary | ICD-10-CM

## 2012-01-02 DIAGNOSIS — R0989 Other specified symptoms and signs involving the circulatory and respiratory systems: Secondary | ICD-10-CM

## 2012-01-02 DIAGNOSIS — Z7901 Long term (current) use of anticoagulants: Secondary | ICD-10-CM

## 2012-01-02 LAB — POCT INR: INR: 2.2

## 2012-01-05 ENCOUNTER — Telehealth: Payer: Self-pay | Admitting: Cardiology

## 2012-01-05 NOTE — Telephone Encounter (Signed)
Advised patient

## 2012-01-05 NOTE — Telephone Encounter (Signed)
Decrease Lasix to just one tablet twice a day

## 2012-01-05 NOTE — Telephone Encounter (Signed)
Patient weight is down 7 lbs in 5 days.  Swelling in feet down.  Taking Lasix 40 mg 2 in am and 1 in pm, does this need to be decreased.  Will forward to  Dr. Patty Sermons for review

## 2012-01-05 NOTE — Telephone Encounter (Signed)
New msg Pt wants to talk to you about lasix please call him back

## 2012-01-09 ENCOUNTER — Ambulatory Visit: Payer: Self-pay | Admitting: Cardiovascular Disease

## 2012-01-09 DIAGNOSIS — Z7901 Long term (current) use of anticoagulants: Secondary | ICD-10-CM

## 2012-01-09 DIAGNOSIS — I4891 Unspecified atrial fibrillation: Secondary | ICD-10-CM

## 2012-01-14 ENCOUNTER — Telehealth: Payer: Self-pay | Admitting: Oncology

## 2012-01-14 NOTE — Telephone Encounter (Signed)
appt moved from 3/25 and r/s to 4/8,pt aware  aom

## 2012-01-15 NOTE — Progress Notes (Signed)
[facility]  NAME:  [name] [name], [name] [name]               ACCOUNT NO.:  [id number]  MEDICAL RECORD NO.:  [id number]      DATE OF BIRTH:  Jan 05, 1931  PHYSICIAN:  Wayland Denis, DO       VISIT DATE:  01/14/2012                                  OFFICE VISIT   The patient is a 78 year old gentleman, who is here with his daughter for followup on his sacral ulcer.  He has been using the [facility] and has responded extremely well, with marked improvement in his depth and size. It is so small now that it is smaller than the base of the tubing.  He is very pleased with his progress.  There has been no change in his medications.  Social history is unchanged, and review of systems is negative.  On exam, he is alert, oriented, cooperative, not in any acute distress, very pleasant.  Pupils are equal.  Extraocular muscles are intact.  No cervical lymphadenopathy.  His breathing is unlabored, and his heart rate is regular.  The wound is markedly improved and description noted above.  We will take him off the [facility] because I am concerned that it will cause too much periwound irritation, and switch him to collagen, and then see him back in 3 weeks.     Wayland Denis, DO     CS/MEDQ  D:  01/14/2012  T:  01/15/2012  Job:  [id number]

## 2012-01-16 ENCOUNTER — Ambulatory Visit: Payer: Self-pay | Admitting: Internal Medicine

## 2012-01-16 DIAGNOSIS — Z7901 Long term (current) use of anticoagulants: Secondary | ICD-10-CM

## 2012-01-16 DIAGNOSIS — I4891 Unspecified atrial fibrillation: Secondary | ICD-10-CM

## 2012-01-21 ENCOUNTER — Encounter (HOSPITAL_BASED_OUTPATIENT_CLINIC_OR_DEPARTMENT_OTHER): Payer: Medicare Other

## 2012-01-23 ENCOUNTER — Ambulatory Visit: Payer: Self-pay | Admitting: Internal Medicine

## 2012-01-23 DIAGNOSIS — Z7901 Long term (current) use of anticoagulants: Secondary | ICD-10-CM

## 2012-01-23 DIAGNOSIS — I4891 Unspecified atrial fibrillation: Secondary | ICD-10-CM

## 2012-02-04 ENCOUNTER — Encounter (HOSPITAL_BASED_OUTPATIENT_CLINIC_OR_DEPARTMENT_OTHER): Payer: Medicare Other | Attending: Plastic Surgery

## 2012-02-04 DIAGNOSIS — L98499 Non-pressure chronic ulcer of skin of other sites with unspecified severity: Secondary | ICD-10-CM | POA: Insufficient documentation

## 2012-02-06 ENCOUNTER — Ambulatory Visit (INDEPENDENT_AMBULATORY_CARE_PROVIDER_SITE_OTHER): Payer: Medicare Other | Admitting: *Deleted

## 2012-02-06 DIAGNOSIS — Z7901 Long term (current) use of anticoagulants: Secondary | ICD-10-CM

## 2012-02-06 DIAGNOSIS — I4891 Unspecified atrial fibrillation: Secondary | ICD-10-CM

## 2012-02-06 LAB — POCT INR: INR: 2.3

## 2012-02-09 ENCOUNTER — Ambulatory Visit: Payer: Medicare Other | Admitting: Oncology

## 2012-02-09 ENCOUNTER — Other Ambulatory Visit: Payer: Medicare Other

## 2012-02-10 ENCOUNTER — Encounter: Payer: Self-pay | Admitting: Cardiology

## 2012-02-10 ENCOUNTER — Ambulatory Visit (INDEPENDENT_AMBULATORY_CARE_PROVIDER_SITE_OTHER): Payer: Medicare Other | Admitting: Cardiology

## 2012-02-10 VITALS — BP 126/76 | HR 83 | Ht 77.0 in | Wt 186.0 lb

## 2012-02-10 DIAGNOSIS — Z9889 Other specified postprocedural states: Secondary | ICD-10-CM

## 2012-02-10 DIAGNOSIS — I4891 Unspecified atrial fibrillation: Secondary | ICD-10-CM

## 2012-02-10 DIAGNOSIS — E876 Hypokalemia: Secondary | ICD-10-CM

## 2012-02-10 DIAGNOSIS — I480 Paroxysmal atrial fibrillation: Secondary | ICD-10-CM

## 2012-02-10 DIAGNOSIS — L8990 Pressure ulcer of unspecified site, unspecified stage: Secondary | ICD-10-CM

## 2012-02-10 NOTE — Assessment & Plan Note (Signed)
EKG today confirms that the patient is in normal sinus rhythm.  He was in normal sinus rhythm when we saw him on 12/22/2011 also.  Our plan had been to keep him on warfarin for about 3 months and then switch him to just a baby aspirin.  If he has no arrhythmias between now and April 15, we will stop warfarin at that point and just continue with a baby aspirin.

## 2012-02-10 NOTE — Progress Notes (Signed)
[name] [name] Date of Birth:  March 16, 1931 [facility] [address] [place], [place]  [address] [phone]         Fax   [phone]  History of Present Illness: This pleasant 78 year old gentleman is seen for a scheduled followup office visit.  He has a past history of severe mitral regurgitation secondary to mitral valve prolapse.  On 10/01/11 he underwent mitral valve repair by Dr. Cornelius Moras.  He had a prolonged post -op course in the hospital the cause of problems with fluid overload and perioperative atrial fibrillation.  He also developed a decubitus sacral ulcer in the hospital which as been slow to heal but is finally healed.  The patient reports today that he is feeling better.  He is stronger.  His appetite has improved.  He has not been aware of any palpitations or irregular pulse.  He remains on warfarin.  Current Outpatient Prescriptions  Medication Sig Dispense Refill  . atorvastatin (LIPITOR) 10 MG tablet Take 10 mg by mouth daily.        . feeding supplement (ENSURE) PUDG Take 1 Container by mouth 3 (three) times daily with meals.      . furosemide (LASIX) 40 MG tablet Take 40 mg by mouth daily.       . metoprolol tartrate (LOPRESSOR) 25 MG tablet Take 1 tablet (25 mg total) by mouth 2 (two) times daily.  180 tablet  3  . Multiple Vitamins-Minerals (MULTIVITAMINS THER. W/MINERALS) TABS Take 1 tablet by mouth daily.  30 each    . potassium chloride SA (K-DUR,KLOR-CON) 20 MEQ tablet Take 1 tablet (20 mEq total) by mouth 3 (three) times daily.  270 tablet  3  . RESTASIS 0.05 % ophthalmic emulsion Place 1 drop into both eyes every 12 (twelve) hours.       . sodium chloride (MURO 128) 5 % ophthalmic ointment Place 1 drop into both eyes at bedtime.        . Sodium Chloride, Hypertonic, (MURO 128 OP) Apply 1 drop to eye 2 (two) times daily.       . traZODone (DESYREL) 100 MG tablet Take 1.5 tablets (150 mg total) by mouth at bedtime.      . vitamin C (VITAMIN C)  500 MG tablet Take 1 tablet (500 mg total) by mouth daily.      Marland Kitchen warfarin (COUMADIN) 3 MG tablet Take 1 tablet (3 mg total) by mouth daily.  30 tablet  2  . Zinc Sulfate (ZINC-220 PO) Take 1 tablet by mouth daily.        No Known Allergies  Patient Active Problem List  Diagnoses  . TINEA PEDIS  . ADENOCARCINOMA, PROSTATE  . HYPERCHOLESTEROLEMIA  . THROMBOCYTOPENIA  . DEPRESSION  . HYPERTENSION, BENIGN ESSENTIAL  . GERD  . DERMATITIS, ATOPIC  . CALLUS, LEFT FOOT  . OSTEOARTHRITIS, GENERALIZED, MULTIPLE JOINTS  . BACK PAIN, CHRONIC  . MUSCLE SPASM, BACK  . PERSONAL HISTORY MALIGNANT NEOPLASM PROSTATE  . PERSONAL HISTORY OF MALIGNANT MELANOMA OF SKIN  . ARRHYTHMIA, HX OF  . PERSONAL HISTORY OF COLONIC POLYPS  . Cerumen impaction  . Hearing loss  . Valvular heart disease  . S/P mitral valve repair  . Atrial fibrillation  . Pneumothorax, postoperative  . Decubitus skin ulcer  . Pleural effusion due to congestive heart failure  . Encounter for long-term (current) use of anticoagulants  . Hx of mitral valve repair    History  Smoking status  . Never Smoker  Smokeless tobacco  . Not on file    History  Alcohol Use No    Family History  Problem Relation Age of Onset  . Clotting disorder Brother     CVA's  . Arthritis Mother   . Hypertension Mother   . Hypertension Father   . Psychosis Father     psychiatric care  . Stroke Mother     Review of Systems: Constitutional: no fever chills diaphoresis or fatigue or change in weight.  Head and neck: no hearing loss, no epistaxis, no photophobia or visual disturbance. Respiratory: No cough, shortness of breath or wheezing. Cardiovascular: No chest pain peripheral edema, palpitations. Gastrointestinal: No abdominal distention, no abdominal pain, no change in bowel habits hematochezia or melena. Genitourinary: No dysuria, no frequency, no urgency, no nocturia. Musculoskeletal:No arthralgias, no back pain, no gait  disturbance or myalgias. Neurological: No dizziness, no headaches, no numbness, no seizures, no syncope, no weakness, no tremors. Hematologic: No lymphadenopathy, no easy bruising. Psychiatric: No confusion, no hallucinations, no sleep disturbance.    Physical Exam: Filed Vitals:   02/10/12 1430  BP: 126/76  Pulse: 83   the general appearance reveals a tall elderly gentleman in no distress.Pupils equal and reactive.   Extraocular Movements are full.  There is no scleral icterus.  The mouth and pharynx are normal.  The neck is supple.  The carotids reveal no bruits.  The jugular venous pressure is normal.  The thyroid is not enlarged.  There is no lymphadenopathy.  The chest is clear to percussion and auscultation. There are no rales or rhonchi. Expansion of the chest is symmetrical.  The heart reveals no murmur of mitral regurgitation.  The rhythm is regular.  There is no gallop or rub.The abdomen is soft and nontender. Bowel sounds are normal. The liver and spleen are not enlarged. There Are no abdominal masses. There are no bruits.  The pedal pulses are good.  There is no phlebitis or edema.  There is no cyanosis or clubbing. Strength is normal and symmetrical in all extremities.  There is no lateralizing weakness.  There are no sensory deficits.  EKG today shows normal sinus rhythm at 83 per minute   Assessment / Plan: At this point we will have him start the cardiac rehabilitation with orientation on April 4 and start exercising on April 8.  This will give [place] an opportunity to observe his heart rhythm with activity and to be sure that he is not having any recurrent atrial fib.  If no recurrent atrial fib he will stop warfarin on April 15 and continue with just an 81 mg aspirin daily.  The patient is no longer fluid overloaded and we are cutting back on his furosemide to 40 mg once a day.  We're checking a basal metabolic panel today.  He'll return in 6 weeks for followup office visit EKG  basal metabolic panel and CBC.  He also has a past history of thrombocytopenia.

## 2012-02-10 NOTE — Patient Instructions (Signed)
Will arrange for you to begin cardiac rehab next week (orientation on April 4 and rehab following week)  Decrease your Lasix (furosemide) to 40 mg daily  Will obtain labs today and call you with the results (bmet)  Stop warfarin on April 15 and begin Aspirin 81 mg daily if no more episodes of A fib  Your physician recommends that you schedule a follow-up appointment in: 6 weeks for office visit, cbc, bmet, and EKG

## 2012-02-10 NOTE — Assessment & Plan Note (Signed)
Hemodynamically the patient is doing very well following his mitral valve repair.  No residual murmur is audible.  He is not having any symptoms of congestive heart failure now and his peripheral edema has cleared.  He has been on furosemide 40 mg twice a day and at this point we will reduce his furosemide to 40 mg once a day.  We are checking a basal metabolic panel today to see where we stand with his potassium.  We have recommended that he begin the cardiac rehabilitation program now and we will ask them to give him in orientation on Thursday, April 4 and then start to rehabilitation on Monday, April 8.  The patient has an appointment to see Dr. Cornelius Moras on April 15

## 2012-02-10 NOTE — Assessment & Plan Note (Signed)
The patient has been using a special treatment to assess the healing of his sacral decubitus which is essentially now healed.  He has one more week of treatment and then he has been told that he will not need any further treatment for the decubitus

## 2012-02-11 LAB — BASIC METABOLIC PANEL
CO2: 32 mEq/L (ref 19–32)
Chloride: 102 mEq/L (ref 96–112)
Creatinine, Ser: 1.1 mg/dL (ref 0.4–1.5)
Potassium: 4 mEq/L (ref 3.5–5.1)
Sodium: 139 mEq/L (ref 135–145)

## 2012-02-11 NOTE — Progress Notes (Signed)
Quick Note:  Please report to patient. The recent labs are stable. Continue same medication and careful diet. ______ 

## 2012-02-12 ENCOUNTER — Telehealth: Payer: Self-pay | Admitting: *Deleted

## 2012-02-12 NOTE — Telephone Encounter (Signed)
Advised patient of labs 

## 2012-02-12 NOTE — Telephone Encounter (Signed)
Message copied by Burnell Blanks on Thu Feb 12, 2012  2:19 PM ------      Message from: Cassell Clement      Created: Wed Feb 11, 2012  9:56 PM       Please report to patient.  The recent labs are stable. Continue same medication and careful diet.

## 2012-02-17 ENCOUNTER — Telehealth: Payer: Self-pay | Admitting: Cardiology

## 2012-02-17 NOTE — Telephone Encounter (Signed)
PT HASN'T HEARD FROM THE CARDIAC [facility]

## 2012-02-17 NOTE — Telephone Encounter (Signed)
Left message with cardiac rehab earlier, will call back tomorrow since patient never heard from them

## 2012-02-18 NOTE — Telephone Encounter (Signed)
Cardiac rehab having problems with fax machine. Information finally received and they will call patient

## 2012-02-19 ENCOUNTER — Encounter (HOSPITAL_COMMUNITY)
Admission: RE | Admit: 2012-02-19 | Discharge: 2012-02-19 | Disposition: A | Discharge status: Home / Self Care | Payer: Medicare Other | Source: Ambulatory Visit | Attending: Cardiology | Admitting: Cardiology

## 2012-02-19 DIAGNOSIS — I4891 Unspecified atrial fibrillation: Secondary | ICD-10-CM | POA: Insufficient documentation

## 2012-02-19 DIAGNOSIS — I059 Rheumatic mitral valve disease, unspecified: Secondary | ICD-10-CM | POA: Insufficient documentation

## 2012-02-19 DIAGNOSIS — Z5189 Encounter for other specified aftercare: Secondary | ICD-10-CM | POA: Insufficient documentation

## 2012-02-19 DIAGNOSIS — Z9889 Other specified postprocedural states: Secondary | ICD-10-CM | POA: Insufficient documentation

## 2012-02-19 DIAGNOSIS — E7800 Pure hypercholesterolemia, unspecified: Secondary | ICD-10-CM | POA: Insufficient documentation

## 2012-02-19 DIAGNOSIS — I10 Essential (primary) hypertension: Secondary | ICD-10-CM | POA: Insufficient documentation

## 2012-02-19 DIAGNOSIS — R011 Cardiac murmur, unspecified: Secondary | ICD-10-CM | POA: Insufficient documentation

## 2012-02-19 DIAGNOSIS — I4949 Other premature depolarization: Secondary | ICD-10-CM | POA: Insufficient documentation

## 2012-02-19 NOTE — Progress Notes (Signed)
Cardiac Rehab Medication Review by a Pharmacist  Does the patient  feel that his/her medications are working for him/her?  yes  Has the patient been experiencing any side effects to the medications prescribed?  no  Does the patient measure his/her own blood pressure or blood glucose at home?  no   Does the patient have any problems obtaining medications due to transportation or finances?   no  Understanding of regimen: excellent Understanding of indications: excellent Potential of compliance: excellent    Pharmacist comments:     Juliann Pulse 02/19/2012 8:28 AM

## 2012-02-23 ENCOUNTER — Other Ambulatory Visit (HOSPITAL_BASED_OUTPATIENT_CLINIC_OR_DEPARTMENT_OTHER): Payer: Medicare Other | Admitting: Lab

## 2012-02-23 ENCOUNTER — Encounter: Payer: Self-pay | Admitting: Oncology

## 2012-02-23 ENCOUNTER — Ambulatory Visit (HOSPITAL_BASED_OUTPATIENT_CLINIC_OR_DEPARTMENT_OTHER): Payer: Medicare Other | Admitting: Oncology

## 2012-02-23 ENCOUNTER — Encounter (HOSPITAL_COMMUNITY): Payer: Medicare Other

## 2012-02-23 VITALS — BP 129/77 | HR 86 | Temp 96.8°F | Ht 76.0 in | Wt 184.3 lb

## 2012-02-23 DIAGNOSIS — Z85820 Personal history of malignant melanoma of skin: Secondary | ICD-10-CM

## 2012-02-23 DIAGNOSIS — I4891 Unspecified atrial fibrillation: Secondary | ICD-10-CM

## 2012-02-23 DIAGNOSIS — Z7901 Long term (current) use of anticoagulants: Secondary | ICD-10-CM

## 2012-02-23 DIAGNOSIS — C439 Malignant melanoma of skin, unspecified: Secondary | ICD-10-CM

## 2012-02-23 LAB — CBC WITH DIFFERENTIAL/PLATELET
Basophils Absolute: 0 10*3/uL (ref 0.0–0.1)
Eosinophils Absolute: 0.1 10*3/uL (ref 0.0–0.5)
HCT: 40.4 % (ref 38.4–49.9)
HGB: 13.1 g/dL (ref 13.0–17.1)
LYMPH%: 16.5 % (ref 14.0–49.0)
MONO#: 1.2 10*3/uL — ABNORMAL HIGH (ref 0.1–0.9)
NEUT#: 7.3 10*3/uL — ABNORMAL HIGH (ref 1.5–6.5)
NEUT%: 70.6 % (ref 39.0–75.0)
Platelets: 93 10*3/uL — ABNORMAL LOW (ref 140–400)
WBC: 10.3 10*3/uL (ref 4.0–10.3)

## 2012-02-23 LAB — COMPREHENSIVE METABOLIC PANEL
Albumin: 3.8 g/dL (ref 3.5–5.2)
BUN: 21 mg/dL (ref 6–23)
CO2: 30 mEq/L (ref 19–32)
Calcium: 9.3 mg/dL (ref 8.4–10.5)
Chloride: 105 mEq/L (ref 96–112)
Creatinine, Ser: 1.1 mg/dL (ref 0.50–1.35)

## 2012-02-23 LAB — LACTATE DEHYDROGENASE: LDH: 222 U/L (ref 94–250)

## 2012-02-23 NOTE — Progress Notes (Signed)
This office note has been dictated.  #[id number]

## 2012-02-23 NOTE — Progress Notes (Signed)
CC:   [name], M.D. [name] [name], M.D.  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  The lesion was 1.27 mm thick with no ulceration.  Clark's level was 4.  Tumor stage was T2A N0 i.e. 1B.  Left axillary lymph node dissection on 11/26/2004 was negative.  A needle aspiration of the left axillary lymph node on 10/01/2006 was negative.  On 01/05/2008 the patient underwent excision of a persistent dysplastic nevus from the right shoulder area by Dr. Arminda [name].  There was another lesion in the right shoulder region that underwent shave biopsy.  This showed melanoma in situ arising in a dysplastic nevus.  This lesion was removed on 12/22/2007.  Margins were free.  There have been no other skin related problems.  The patient tells me that he had a melanoma removed from his left calf in February 2001.  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  The patient has not had any recent skin cancers. The patient's left shoulder melanoma dates back to December 2005.  He underwent a mitral valve repair on 10/01/2011.  He had a very complicated postoperative course with congestive heart failure, postoperative pneumothorax, development of left pleural effusion requiring chest tubes, development of decubitus pressure ulcerations around the buttocks and sacrum.  He was discharged from the hospital on 10/31/2011 after a 1 month long stay in the hospital.  He was then at a nursing facility[name] undergoing rehab and recovery.  He tells me that he will be undergoing cardiac rehab soon.  Apparently the patient required care for his decubitus ulcers.  The patient has had a truly remarkable recovery from all of these problems.  At one point in January he was just barely starting  to ambulate using a walker and a cane.  Other problems have included an episode of atrial fibrillation for which the patient is now on Coumadin. He has had depression, dyslipidemia, history of colonic polyps, hearing impairment, cancer of the prostate for which he underwent prostatectomy in 1993.  MEDICATIONS:  Reviewed and recorded.  The patient is on Lipitor, Lasix, Lopressor, multivitamins, potassium chloride, Restasis eyedrops, Desyrel, vitamin C and Coumadin.  PHYSICAL EXAMINATION:  The patient looks generally well, somewhat tall and lanky.  He will be turning 81 on May 14.  There is no scleral icterus.  Mouth and pharynx are benign.  There is no peripheral adenopathy palpable in the neck, supraclavicular, axillary or inguinal areas.  All of the patient's scars look benign.  His recent scars from his surgery have healed well.  I did not detect any suspicious nevi. The patient does have some exophytic outgrowths, polypoid lesions.  The possibility of neurofibromatosis crossed my mind although I did not detect any cafe au lait spots.  Heart and lungs were normal.  The patient has a pectus excavatum.  Abdomen is benign with no organomegaly or masses palpable.  Extremities, ankles were a little puffy without actual pitting edema.  Neurologic exam is grossly normal.  No new or suspicious lesions.  LABORATORY DATA:  Today, white count 10.3, ANC 7.3, hemoglobin 13.1, hematocrit 40.4, platelets 93,000.  If we go back to 09/04/2011 platelet count was 120,000.  If we go back to 06/02/2006 the platelet count at that time was 135,000.  The  patient has had a consistently low platelet count, certainly less than 120 for the most part.  On his last visit here 09/30/2010, platelet count was 103,000.  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  If we go back to 07/15/2005 when I first saw the patient his platelet count at that time was 140,000.  Chest x-ray, 2 view, from 12/29/2011 showed  poor aeration with bilateral pleural effusions and bibasilar atelectasis.  No pneumothorax was seen. The lungs were not well aerated.  Cardiomegaly was stable.  Median sternotomy sutures and mitral valve replacement were noted.  IMPRESSION AND PLAN:  [name] [name] seems to be on the road to recovery regarding his cardiac surgery back on 10/01/2011.  He remains without evidence of recurrent melanoma or other obvious skin cancers.  He remains under surveillance of Dr. Yetta [name] and sees him every 6 months for skin surveillance.  At this point, the patient is now out almost 7-1/2 years from the time of the diagnosis of the melanoma involving the left shoulder.  I am going to be releasing [name] [name] from our followup appointments.  I would certainly be happy to see him again should any questions or problems arise in the future.  From our standpoint he has done well with no evidence for recurrent or metastatic disease.    ______________________________ Samul Dada, M.D. DSM/MEDQ  D:  02/23/2012  T:  02/23/2012  Job:  [id number]

## 2012-02-25 ENCOUNTER — Encounter (HOSPITAL_COMMUNITY): Payer: Self-pay

## 2012-02-25 ENCOUNTER — Encounter (HOSPITAL_COMMUNITY)
Admission: RE | Admit: 2012-02-25 | Discharge: 2012-02-25 | Disposition: A | Discharge status: Home / Self Care | Payer: Medicare Other | Source: Ambulatory Visit | Attending: Cardiology | Admitting: Cardiology

## 2012-02-25 NOTE — Progress Notes (Signed)
Pt started cardiac rehab today.  Pt tolerated light exercise without difficulty, however pt did experience fatigue requiring rest break after first station.   Denies chest pain or dyspnea. VSS. Telemetry-NSR.  Pt oriented to exercise equipment and routine.  Understanding verbalized.  Will continue to monitor the patient throughout  the program.

## 2012-02-27 ENCOUNTER — Encounter (HOSPITAL_COMMUNITY)
Admission: RE | Admit: 2012-02-27 | Discharge: 2012-02-27 | Disposition: A | Discharge status: Home / Self Care | Payer: Medicare Other | Source: Ambulatory Visit | Attending: Cardiology | Admitting: Cardiology

## 2012-02-27 ENCOUNTER — Ambulatory Visit (INDEPENDENT_AMBULATORY_CARE_PROVIDER_SITE_OTHER): Payer: Medicare Other | Admitting: *Deleted

## 2012-02-27 DIAGNOSIS — I4891 Unspecified atrial fibrillation: Secondary | ICD-10-CM

## 2012-02-27 DIAGNOSIS — Z7901 Long term (current) use of anticoagulants: Secondary | ICD-10-CM

## 2012-03-01 ENCOUNTER — Encounter: Payer: Self-pay | Admitting: Thoracic Surgery (Cardiothoracic Vascular Surgery)

## 2012-03-01 ENCOUNTER — Ambulatory Visit (INDEPENDENT_AMBULATORY_CARE_PROVIDER_SITE_OTHER): Payer: Medicare Other | Admitting: Thoracic Surgery (Cardiothoracic Vascular Surgery)

## 2012-03-01 ENCOUNTER — Encounter (HOSPITAL_COMMUNITY)
Admission: RE | Admit: 2012-03-01 | Discharge: 2012-03-01 | Disposition: A | Discharge status: Home / Self Care | Payer: Medicare Other | Source: Ambulatory Visit | Attending: Cardiology | Admitting: Cardiology

## 2012-03-01 VITALS — BP 123/75 | HR 80 | Resp 18 | Ht 77.0 in | Wt 186.0 lb

## 2012-03-01 DIAGNOSIS — I059 Rheumatic mitral valve disease, unspecified: Secondary | ICD-10-CM

## 2012-03-01 DIAGNOSIS — Z9889 Other specified postprocedural states: Secondary | ICD-10-CM

## 2012-03-01 NOTE — Patient Instructions (Signed)
Patient has been encouraged to continue to participate in outpatient cardiac rehabilitation program. He has been advised that he may gradually increase his physical activity without any particular limitations.

## 2012-03-01 NOTE — Progress Notes (Signed)
[address]            [name] [address]          [phone]     CARDIOTHORACIC SURGERY OFFICE NOTE  Referring Provider is Cassell Clement, MD PCP is Thomos Lemons, DO, DO   HPI:  Patient returns for further followup status post mitral valve repair. He was last seen in office 12/29/2011. Since then he has continued to progress very nicely. His sacral wound healed completely and last week he started outpatient cardiac rehabilitation program. He states that he still gets tired with activity but his exercise tolerance is slowly improving. He still has mild soreness in his chest related to a sternotomy and minithoracotomy, but this also is improving and not really bothering him anymore at this time. He has been maintaining sinus rhythm and he hopes to get off Coumadin in the near future. He was seen in followup by Dr. Patty Sermons on March 26 and plans to continue to followup closely with him in the near future. Overall patient has no complaints at this time.   Current Outpatient Prescriptions  Medication Sig Dispense Refill  . acetaminophen (TYLENOL) 325 MG tablet Take 325 mg by mouth as needed.      Marland Kitchen ascorbic acid (VITAMIN C) 500 MG tablet Take 500 mg by mouth 2 (two) times daily.      Marland Kitchen atorvastatin (LIPITOR) 10 MG tablet Take 10 mg by mouth daily.        . furosemide (LASIX) 40 MG tablet Take 40 mg by mouth daily.       Marland Kitchen lactose free nutrition (BOOST) LIQD Take 1 Container by mouth daily.      . metoprolol tartrate (LOPRESSOR) 25 MG tablet Take 1 tablet (25 mg total) by mouth 2 (two) times daily.  180 tablet  3  . Multiple Vitamins-Minerals (MULTIVITAMINS THER. W/MINERALS) TABS Take 1 tablet by mouth daily.  30 each    . potassium chloride SA (K-DUR,KLOR-CON) 20 MEQ tablet Take 1 tablet (20 mEq total) by mouth 3 (three) times daily.  270 tablet  3  . RESTASIS 0.05 % ophthalmic emulsion Place 1 drop into both eyes every 12 (twelve) hours.       .  traZODone (DESYREL) 100 MG tablet Take 1.5 tablets (150 mg total) by mouth at bedtime.      Marland Kitchen warfarin (COUMADIN) 3 MG tablet Take 3-4.5 mg by mouth daily. Patient takes 3mg  on all days except 4.5 mg on Monday and Friday      . DISCONTD: furosemide (LASIX) 40 MG tablet Take 1 tablet (40 mg total) by mouth 2 (two) times daily.      Marland Kitchen DISCONTD: pantoprazole (PROTONIX) 40 MG tablet Take 1 tablet (40 mg total) by mouth daily before breakfast.      . DISCONTD: potassium chloride SA (K-DUR,KLOR-CON) 20 MEQ tablet Take 2 tablets (40 mEq total) by mouth daily.          Physical Exam:   BP 123/75  Pulse 80  Resp 18  Ht 6\' 5"  (1.956 m)  Wt 186 lb (84.369 kg)  BMI 22.06 kg/m2  SpO2 97%  General:  Well-appearing  Chest:   Clear to auscultation with symmetrical breath sounds  CV:   Regular rate and rhythm without murmur  Incisions:  Completely healed  Abdomen:  Soft and nontender  Extremities:  Warm and well-perfused with  trace lower extremity edema  Diagnostic Tests:  n/a   Impression:  Patient has continued to recover without any further complications. Overall he is progressing quite nicely and his exercise tolerance is gradually improving now that he is started the patient cardiac rehabilitation program.  Plan:  In the future the patient will call and return to see [place] as needed.  He is been advised that he may continue to gradually increase his physical activity as tolerated without any particular limitations due to his previous surgery. We have not made any changes medications today.   Salvatore Decent. Cornelius Moras, MD 03/01/2012 11:31 AM

## 2012-03-02 ENCOUNTER — Telehealth: Payer: Self-pay | Admitting: Cardiology

## 2012-03-02 NOTE — Telephone Encounter (Signed)
Patient phoned stating that no A Fib seen at cardiac rehab and wanting to know if ok to stop warfarin.  Discussed with  Dr. Patty Sermons and ok to stop and start ASA as advised at office visit.  Patient also wanted to know if ok to start back on his Celebrex since no longer taking warfarin. Advised ok per  Dr. Patty Sermons

## 2012-03-02 NOTE — Telephone Encounter (Signed)
Left message to call back  

## 2012-03-02 NOTE — Telephone Encounter (Signed)
Pt calling re coumadin dr Patty Sermons put him on

## 2012-03-02 NOTE — Telephone Encounter (Signed)
Fu call °Patient returning your call °

## 2012-03-03 ENCOUNTER — Encounter (HOSPITAL_COMMUNITY)
Admission: RE | Admit: 2012-03-03 | Discharge: 2012-03-03 | Disposition: A | Discharge status: Home / Self Care | Payer: Medicare Other | Source: Ambulatory Visit | Attending: Cardiology | Admitting: Cardiology

## 2012-03-04 NOTE — Progress Notes (Signed)
[name] [name] 78 y.o. male       Nutrition Screen                                                                    YES  NO Do you live in a nursing home?  X   Do you eat out more than 3 times/week?    X If yes, how many times per week do you eat out?  Do you have food allergies?   X If yes, what are you allergic to?  Have you gained or lost more than 10 lbs without trying?              X  If yes, how much weight have you lost and over what time period? 30 lbs lost over 2 months  Do you want to lose weight?     X If yes, what is a goal weight or amount of weight you would like to lose?  lb  Do you eat alone most of the time?  X    Do you eat less than 2 meals/day?  X If yes, how many meals do you eat?  Do you drink more than 3 alcohol drinks/day?  X If yes, how many drinks per day?  Are you having trouble with constipation? * X  If yes, what are you doing to help relieve constipation? Metamucil  Do you have financial difficulties with buying food?*    X   Are you experiencing regular nausea/ vomiting?*     X   Do you have a poor appetite? *                                        X   Do you have trouble chewing/swallowing? *   X    Pt with diagnoses of:  X GERD          X MVR      X Dyslipidemia  / HDL< 40 / LDL>70 / High TG      X %  Body fat >goal / Body Mass Index >25 X HTN / BP >120/80       Pt Risk Score   7       Diagnosis Risk Score  25       Total Risk Score   32                         High Risk               X Low Risk    HT: 78" Ht Readings from Last 1 Encounters:  03/01/12 6\' 5"  (1.956 m)    WT:   185.2 lb (84.2 kg) Wt Readings from Last 3 Encounters:  03/01/12 186 lb (84.369 kg)  02/23/12 184 lb 4.8 oz (83.598 kg)  02/19/12 185 lb 10 oz (84.2 kg)     IBW 91.8 92%IBW BMI 22.6 26.1%body fat  Meds reviewed: MVI, Vitamin C, Potassium Chloride, Boost daily Past Medical History  Diagnosis Date  . Prostate cancer   . Melanoma     Left Shoulder  .  Vision  abnormalities     Cornea scarring  . Osteoarthritis     Knees  . Hypertension   . Pure hypercholesterolemia   . Depressive disorder, not elsewhere classified   . MVP (mitral valve prolapse)   . Personal history of colonic polyps   . Valvular heart disease     Last echo in 2010 with EF 55 to 60%, +MVP, mild MR, diastolic dysfunction, biatrial enlargement, mild AI and mild pulmonary HTN  . Normal nuclear stress test 2007  . Thrombocytopenia   . Mitral regurgitation   . MVP (mitral valve prolapse)   . PVC (premature ventricular contraction)   . Shortness of breath   . Blood transfusion   . Neuropathy   . Wears glasses   . Heart murmur     Dr Patty Sermons cardiologist       Activity level: Pt is sedentary  Wt goal: 185 lb ( 84.2 kg) Current tobacco use? No Food/Drug Interaction? No Labs:  Lipid Panel     Component Value Date/Time   CHOL 148 04/01/2011 1104   TRIG 76.0 04/01/2011 1104   HDL 39.60 04/01/2011 1104   CHOLHDL 4 04/01/2011 1104   VLDL 15.2 04/01/2011 1104   LDLCALC 93 04/01/2011 1104   Lab Results  Component Value Date   HGBA1C 5.6 09/29/2011   LDL goal: < 100      MI, DM, Carotid or PVD and > 2:      HTN, HDL, > 78 yo male Estimated Daily Nutrition Needs for: ? wt maintenance 2350-2650 Kcal , Total Fat 75-85gm, Saturated Fat 17-20 gm, Trans Fat 2.5-3.0 gm,  Sodium less than 1500 mg

## 2012-03-05 ENCOUNTER — Encounter (HOSPITAL_COMMUNITY)
Admission: RE | Admit: 2012-03-05 | Discharge: 2012-03-05 | Disposition: A | Discharge status: Home / Self Care | Payer: Medicare Other | Source: Ambulatory Visit | Attending: Cardiology | Admitting: Cardiology

## 2012-03-05 NOTE — Progress Notes (Signed)
Reviewed home exercise with pt today.  Pt plans to walk at home for exercise.  Reviewed THR, pulse, RPE, sign and symptoms, and when to call 911 or MD.  Pt voiced understanding. Electronically signed by Harriett Sine MS on Friday March 05 2012 at 1433

## 2012-03-08 ENCOUNTER — Encounter (HOSPITAL_COMMUNITY)
Admission: RE | Admit: 2012-03-08 | Discharge: 2012-03-08 | Disposition: A | Discharge status: Home / Self Care | Payer: Medicare Other | Source: Ambulatory Visit | Attending: Cardiology | Admitting: Cardiology

## 2012-03-10 ENCOUNTER — Encounter (HOSPITAL_COMMUNITY)
Admission: RE | Admit: 2012-03-10 | Discharge: 2012-03-10 | Disposition: A | Discharge status: Home / Self Care | Payer: Medicare Other | Source: Ambulatory Visit | Attending: Cardiology | Admitting: Cardiology

## 2012-03-12 ENCOUNTER — Encounter (HOSPITAL_COMMUNITY)
Admission: RE | Admit: 2012-03-12 | Discharge: 2012-03-12 | Disposition: A | Discharge status: Home / Self Care | Payer: Medicare Other | Source: Ambulatory Visit | Attending: Cardiology | Admitting: Cardiology

## 2012-03-15 ENCOUNTER — Encounter (HOSPITAL_COMMUNITY)
Admission: RE | Admit: 2012-03-15 | Discharge: 2012-03-15 | Disposition: A | Discharge status: Home / Self Care | Payer: Medicare Other | Source: Ambulatory Visit | Attending: Cardiology | Admitting: Cardiology

## 2012-03-17 ENCOUNTER — Encounter (HOSPITAL_COMMUNITY)
Admission: RE | Admit: 2012-03-17 | Discharge: 2012-03-17 | Disposition: A | Discharge status: Home / Self Care | Payer: Medicare Other | Source: Ambulatory Visit | Attending: Cardiology | Admitting: Cardiology

## 2012-03-17 DIAGNOSIS — Z9889 Other specified postprocedural states: Secondary | ICD-10-CM | POA: Insufficient documentation

## 2012-03-17 DIAGNOSIS — I059 Rheumatic mitral valve disease, unspecified: Secondary | ICD-10-CM | POA: Insufficient documentation

## 2012-03-17 DIAGNOSIS — I10 Essential (primary) hypertension: Secondary | ICD-10-CM | POA: Insufficient documentation

## 2012-03-17 DIAGNOSIS — E7800 Pure hypercholesterolemia, unspecified: Secondary | ICD-10-CM | POA: Insufficient documentation

## 2012-03-17 DIAGNOSIS — Z5189 Encounter for other specified aftercare: Secondary | ICD-10-CM | POA: Insufficient documentation

## 2012-03-17 DIAGNOSIS — I4891 Unspecified atrial fibrillation: Secondary | ICD-10-CM | POA: Insufficient documentation

## 2012-03-17 DIAGNOSIS — R011 Cardiac murmur, unspecified: Secondary | ICD-10-CM | POA: Insufficient documentation

## 2012-03-17 DIAGNOSIS — I4949 Other premature depolarization: Secondary | ICD-10-CM | POA: Insufficient documentation

## 2012-03-17 NOTE — Progress Notes (Signed)
[name] [name] 78 y.o. male Nutrition Note Spoke with pt. Pt states his wt is down 15# from his UBW of 200#. Per discussion, pt unsure if he wants to gain wt back to his UBW or maintain his current wt. Wt maintenance vs. Wt gain discussed at length. Pt drinks Boost daily, which has increased pt energy level according to the pt. Nutrition Plan and Nutrition Survey reviewed with pt. Pt is following Step 1 of the Therapeutic Lifestyle Changes diet. Pt consumes 2 servings of fruits and veggies daily. Pt encouraged to increase fruit and veggie intake to a minimum of 5 servings a day. Pt expressed understanding. Nutrition Diagnosis   Food-and nutrition-related knowledge deficit related to lack of exposure to information as related to diagnosis of: ? CVD Nutrition RX/ Estimated Daily Nutrition Needs for: wt maintenance 2350-2650 Kcal, 75-85 gm fat, 17-20 gm sat fat, 2.5-3.0 gm trans-fat, <1500 mg sodium  Nutrition Intervention   Pt's individual nutrition plan including cholesterol goals reviewed with pt.   Benefits of adopting Therapeutic Lifestyle Changes discussed when Medficts reviewed.   Pt to attend the Portion Distortion class   Pt to attend the  ? Nutrition I class                         ? Nutrition II class    Continue client-centered nutrition education by RD, as part of interdisciplinary care. Goal(s)   Pt to describe the benefit of including fruits, vegetables, whole grains, and low-fat dairy products in a heart healthy meal plan. Monitor and Evaluate progress toward nutrition goal with team.

## 2012-03-19 ENCOUNTER — Encounter (HOSPITAL_COMMUNITY)
Admission: RE | Admit: 2012-03-19 | Discharge: 2012-03-19 | Disposition: A | Discharge status: Home / Self Care | Payer: Medicare Other | Source: Ambulatory Visit | Attending: Cardiology | Admitting: Cardiology

## 2012-03-22 ENCOUNTER — Encounter (HOSPITAL_COMMUNITY)
Admission: RE | Admit: 2012-03-22 | Discharge: 2012-03-22 | Disposition: A | Discharge status: Home / Self Care | Payer: Medicare Other | Source: Ambulatory Visit | Attending: Cardiology | Admitting: Cardiology

## 2012-03-24 ENCOUNTER — Encounter (HOSPITAL_COMMUNITY)
Admission: RE | Admit: 2012-03-24 | Discharge: 2012-03-24 | Disposition: A | Discharge status: Home / Self Care | Payer: Medicare Other | Source: Ambulatory Visit | Attending: Cardiology | Admitting: Cardiology

## 2012-03-26 ENCOUNTER — Encounter: Payer: Self-pay | Admitting: Cardiology

## 2012-03-26 ENCOUNTER — Other Ambulatory Visit: Payer: Medicare Other

## 2012-03-26 ENCOUNTER — Ambulatory Visit (INDEPENDENT_AMBULATORY_CARE_PROVIDER_SITE_OTHER): Payer: Medicare Other | Admitting: Cardiology

## 2012-03-26 ENCOUNTER — Encounter (HOSPITAL_COMMUNITY)
Admission: RE | Admit: 2012-03-26 | Discharge: 2012-03-26 | Disposition: A | Discharge status: Home / Self Care | Payer: Medicare Other | Source: Ambulatory Visit | Attending: Cardiology | Admitting: Cardiology

## 2012-03-26 VITALS — BP 100/78 | HR 80 | Ht 77.0 in | Wt 181.0 lb

## 2012-03-26 DIAGNOSIS — D649 Anemia, unspecified: Secondary | ICD-10-CM

## 2012-03-26 DIAGNOSIS — I10 Essential (primary) hypertension: Secondary | ICD-10-CM

## 2012-03-26 DIAGNOSIS — R059 Cough, unspecified: Secondary | ICD-10-CM | POA: Insufficient documentation

## 2012-03-26 DIAGNOSIS — E876 Hypokalemia: Secondary | ICD-10-CM

## 2012-03-26 DIAGNOSIS — D696 Thrombocytopenia, unspecified: Secondary | ICD-10-CM

## 2012-03-26 DIAGNOSIS — R05 Cough: Secondary | ICD-10-CM

## 2012-03-26 DIAGNOSIS — Z79899 Other long term (current) drug therapy: Secondary | ICD-10-CM

## 2012-03-26 DIAGNOSIS — E7800 Pure hypercholesterolemia, unspecified: Secondary | ICD-10-CM

## 2012-03-26 DIAGNOSIS — Z9889 Other specified postprocedural states: Secondary | ICD-10-CM

## 2012-03-26 LAB — CBC WITH DIFFERENTIAL/PLATELET
Basophils Absolute: 0 10*3/uL (ref 0.0–0.1)
Basophils Relative: 0.4 % (ref 0.0–3.0)
Eosinophils Absolute: 0.1 10*3/uL (ref 0.0–0.7)
Eosinophils Relative: 0.7 % (ref 0.0–5.0)
HCT: 44.2 % (ref 39.0–52.0)
Hemoglobin: 14.3 g/dL (ref 13.0–17.0)
Lymphocytes Relative: 26.2 % (ref 12.0–46.0)
Lymphs Abs: 2.1 10*3/uL (ref 0.7–4.0)
MCHC: 32.4 g/dL (ref 30.0–36.0)
MCV: 85.7 fl (ref 78.0–100.0)
Monocytes Absolute: 0.9 10*3/uL (ref 0.1–1.0)
Monocytes Relative: 11.7 % (ref 3.0–12.0)
Neutro Abs: 4.8 10*3/uL (ref 1.4–7.7)
Neutrophils Relative %: 61 % (ref 43.0–77.0)
Platelets: 112 10*3/uL — ABNORMAL LOW (ref 150.0–400.0)
RBC: 5.16 Mil/uL (ref 4.22–5.81)
RDW: 19 % — ABNORMAL HIGH (ref 11.5–14.6)
WBC: 7.9 10*3/uL (ref 4.5–10.5)

## 2012-03-26 LAB — BASIC METABOLIC PANEL WITH GFR
BUN: 21 mg/dL (ref 6–23)
CO2: 29 meq/L (ref 19–32)
Calcium: 9.6 mg/dL (ref 8.4–10.5)
Chloride: 105 meq/L (ref 96–112)
Creatinine, Ser: 1.1 mg/dL (ref 0.4–1.5)
GFR: 70.1 mL/min
Glucose, Bld: 104 mg/dL — ABNORMAL HIGH (ref 70–99)
Potassium: 4.5 meq/L (ref 3.5–5.1)
Sodium: 141 meq/L (ref 135–145)

## 2012-03-26 LAB — HEPATIC FUNCTION PANEL
ALT: 17 U/L (ref 0–53)
AST: 20 U/L (ref 0–37)
Albumin: 4 g/dL (ref 3.5–5.2)
Alkaline Phosphatase: 50 U/L (ref 39–117)
Bilirubin, Direct: 0.1 mg/dL (ref 0.0–0.3)
Total Bilirubin: 0.9 mg/dL (ref 0.3–1.2)
Total Protein: 7.4 g/dL (ref 6.0–8.3)

## 2012-03-26 LAB — LIPID PANEL
Cholesterol: 119 mg/dL (ref 0–200)
HDL: 41.5 mg/dL
LDL Cholesterol: 66 mg/dL (ref 0–99)
Total CHOL/HDL Ratio: 3
Triglycerides: 58 mg/dL (ref 0.0–149.0)
VLDL: 11.6 mg/dL (ref 0.0–40.0)

## 2012-03-26 NOTE — Progress Notes (Signed)
[name] [name] Date of Birth:  04-06-1931 [facility] [address] [place], [place]  [address] [phone]         Fax   [phone]  History of Present Illness: This pleasant 78 year old gentleman is seen for a scheduled followup office visit.  He has a past history of severe mitral valve prolapse.  He underwent mitral valve repair for Dr. Elisha Headland on 10/01/11.  He did not have any coronary artery disease.  He had a prolonged hospital course because of fluid retention and a decubitus sacral ulcer.  He eventually recovered and is now doing very well.  He is in the cardiac rehabilitation program and is doing well there.  His decubitus ulcer has healed.  Postoperatively he had atrial fibrillation but is now back in normal sinus rhythm.  He is no longer on warfarin.  He does take a baby aspirin daily.  Current Outpatient Prescriptions  Medication Sig Dispense Refill  . acetaminophen (TYLENOL) 325 MG tablet Take 325 mg by mouth as needed.      Marland Kitchen ascorbic acid (VITAMIN C) 500 MG tablet Take 500 mg by mouth 2 (two) times daily.      Marland Kitchen aspirin 81 MG tablet Take 81 mg by mouth daily.      Marland Kitchen atorvastatin (LIPITOR) 10 MG tablet Take 10 mg by mouth daily.        . celecoxib (CELEBREX) 200 MG capsule Take 200 mg by mouth daily.      . furosemide (LASIX) 40 MG tablet Take 40 mg by mouth daily.       . Glucosamine-Chondroitin (GLUCOSAMINE CHONDR COMPLEX PO) Take by mouth 3 (three) times daily.      Marland Kitchen lactose free nutrition (BOOST) LIQD Take 1 Container by mouth daily.      . metoprolol tartrate (LOPRESSOR) 25 MG tablet Take 1 tablet (25 mg total) by mouth 2 (two) times daily.  180 tablet  3  . Multiple Vitamins-Minerals (MULTIVITAMINS THER. W/MINERALS) TABS Take 1 tablet by mouth daily.  30 each    . potassium chloride SA (K-DUR,KLOR-CON) 20 MEQ tablet Take 1 tablet (20 mEq total) by mouth 3 (three) times daily.  270 tablet  3  . RESTASIS 0.05 % ophthalmic emulsion Place 1 drop into  both eyes every 12 (twelve) hours.       . traZODone (DESYREL) 100 MG tablet Take 1.5 tablets (150 mg total) by mouth at bedtime.      Marland Kitchen DISCONTD: furosemide (LASIX) 40 MG tablet Take 1 tablet (40 mg total) by mouth 2 (two) times daily.      Marland Kitchen DISCONTD: pantoprazole (PROTONIX) 40 MG tablet Take 1 tablet (40 mg total) by mouth daily before breakfast.      . DISCONTD: potassium chloride SA (K-DUR,KLOR-CON) 20 MEQ tablet Take 2 tablets (40 mEq total) by mouth daily.        No Known Allergies  Patient Active Problem List  Diagnoses  . TINEA PEDIS  . ADENOCARCINOMA, PROSTATE  . HYPERCHOLESTEROLEMIA  . THROMBOCYTOPENIA  . DEPRESSION  . HYPERTENSION, BENIGN ESSENTIAL  . GERD  . DERMATITIS, ATOPIC  . CALLUS, LEFT FOOT  . OSTEOARTHRITIS, GENERALIZED, MULTIPLE JOINTS  . BACK PAIN, CHRONIC  . MUSCLE SPASM, BACK  . PERSONAL HISTORY MALIGNANT NEOPLASM PROSTATE  . PERSONAL HISTORY OF MALIGNANT MELANOMA OF SKIN  . ARRHYTHMIA, HX OF  . PERSONAL HISTORY OF COLONIC POLYPS  . Cerumen impaction  . Hearing loss  . Valvular heart disease  . S/P  mitral valve repair  . Atrial fibrillation  . Pneumothorax, postoperative  . Decubitus skin ulcer  . Pleural effusion due to congestive heart failure  . Encounter for long-term (current) use of anticoagulants  . Hx of mitral valve repair    History  Smoking status  . Never Smoker   Smokeless tobacco  . Never Used    History  Alcohol Use No    Family History  Problem Relation Age of Onset  . Clotting disorder Brother     CVA's  . Arthritis Mother   . Hypertension Mother   . Hypertension Father   . Psychosis Father     psychiatric care  . Stroke Mother     Review of Systems: Constitutional: no fever chills diaphoresis or fatigue or change in weight.  Head and neck: no hearing loss, no epistaxis, no photophobia or visual disturbance. Respiratory: No cough, shortness of breath or wheezing. Cardiovascular: No chest pain peripheral edema,  palpitations. Gastrointestinal: No abdominal distention, no abdominal pain, no change in bowel habits hematochezia or melena. Genitourinary: No dysuria, no frequency, no urgency, no nocturia. Musculoskeletal:No arthralgias, no back pain, no gait disturbance or myalgias. Neurological: No dizziness, no headaches, no numbness, no seizures, no syncope, no weakness, no tremors. Hematologic: No lymphadenopathy, no easy bruising. Psychiatric: No confusion, no hallucinations, no sleep disturbance.    Physical Exam: Filed Vitals:   03/26/12 0946  BP: 100/78  Pulse: 80   the general appearance reveals a well-developed tall gentleman in no distress.Pupils equal and reactive.   Extraocular Movements are full.  There is no scleral icterus.  The mouth and pharynx are normal.  The neck is supple.  The carotids reveal no bruits.  The jugular venous pressure is normal.  The thyroid is not enlarged.  There is no lymphadenopathy.  The chest is clear to percussion and auscultation. There are no rales or rhonchi. Expansion of the chest is symmetrical.  The precordium is quiet.  The first heart sound is normal.  The second heart sound is physiologically split.  There is no murmur gallop rub or click.  There is no abnormal lift or heave.  The abdomen is soft and nontender. Bowel sounds are normal. The liver and spleen are not enlarged. There Are no abdominal masses. There are no bruits.  The pedal pulses are good.  There is no phlebitis or edema.  There is no cyanosis or clubbing. Strength is normal and symmetrical in all extremities.  There is no lateralizing weakness.  There are no sensory deficits.  The skin is warm and dry.  There is no rash.  EKG today shows normal sinus rhythm with vertical axis.  There is a complete right bundle branch block.  There is poor R-wave progression V1 through V4 consistent with his known pectus excavatum.  Assessment / Plan: Continue same medication.  Recheck in 3 months.   He is enjoying the rehabilitation program and I encouraged him to consider staying in the maintenance program.

## 2012-03-26 NOTE — Assessment & Plan Note (Signed)
The patient has a past history of thrombocytopenia.  Her checking a CBC today.  The patient has a past history of melanoma but got a recent good report from his dermatologist.  Patient also has a past history of prostate cancer but he reports that his PSA levels are staying extremely low.

## 2012-03-26 NOTE — Assessment & Plan Note (Signed)
Patient has had a recent upper respiratory infection associated with sore throat and a lot of nasal drainage.  The drainage is now clear.  He has been taking Mucinex.  He has not really felt sick and he said no fever.  He does not need an antibiotic at this point.  I suspect that a lot of his symptoms may be related to seasonal allergy which should be improving

## 2012-03-26 NOTE — Assessment & Plan Note (Signed)
Patient is doing well.  He is not having any symptoms of palpitations chest pain or significant exertional dyspnea.  His strength is improving week by week.  His previous severe peripheral edema has resolved

## 2012-03-26 NOTE — Patient Instructions (Signed)
Will obtain labs today and call you with the results  Your physician recommends that you continue on your current medications as directed. Please refer to the Current Medication list given to you today.  Your physician recommends that you schedule a follow-up appointment in: 3 months

## 2012-03-26 NOTE — Assessment & Plan Note (Signed)
His blood pressure is stable on current therapy.  He is not having any dizziness or syncope or headaches.

## 2012-03-27 NOTE — Progress Notes (Signed)
Quick Note:  Please report to patient. The recent labs are stable. Continue same medication and careful diet. ______ 

## 2012-03-29 ENCOUNTER — Encounter (HOSPITAL_COMMUNITY)
Admission: RE | Admit: 2012-03-29 | Discharge: 2012-03-29 | Disposition: A | Discharge status: Home / Self Care | Payer: Medicare Other | Source: Ambulatory Visit | Attending: Cardiology | Admitting: Cardiology

## 2012-03-29 ENCOUNTER — Telehealth: Payer: Self-pay | Admitting: *Deleted

## 2012-03-29 NOTE — Telephone Encounter (Signed)
Advised and mailed labs

## 2012-03-29 NOTE — Telephone Encounter (Signed)
Message copied by Burnell Blanks on Mon Mar 29, 2012 10:25 AM ------      Message from: Cassell Clement      Created: Sat Mar 27, 2012  5:50 PM       Please report to patient.  The recent labs are stable. Continue same medication and careful diet.

## 2012-03-31 ENCOUNTER — Encounter (HOSPITAL_COMMUNITY)
Admission: RE | Admit: 2012-03-31 | Discharge: 2012-03-31 | Disposition: A | Discharge status: Home / Self Care | Payer: Medicare Other | Source: Ambulatory Visit | Attending: Cardiology | Admitting: Cardiology

## 2012-04-02 ENCOUNTER — Encounter (HOSPITAL_COMMUNITY)
Admission: RE | Admit: 2012-04-02 | Discharge: 2012-04-02 | Disposition: A | Discharge status: Home / Self Care | Payer: Medicare Other | Source: Ambulatory Visit | Attending: Cardiology | Admitting: Cardiology

## 2012-04-05 ENCOUNTER — Encounter (HOSPITAL_COMMUNITY)
Admission: RE | Admit: 2012-04-05 | Discharge: 2012-04-05 | Disposition: A | Discharge status: Home / Self Care | Payer: Medicare Other | Source: Ambulatory Visit | Attending: Cardiology | Admitting: Cardiology

## 2012-04-07 ENCOUNTER — Encounter (HOSPITAL_COMMUNITY)
Admission: RE | Admit: 2012-04-07 | Discharge: 2012-04-07 | Disposition: A | Discharge status: Home / Self Care | Payer: Medicare Other | Source: Ambulatory Visit | Attending: Cardiology | Admitting: Cardiology

## 2012-04-09 ENCOUNTER — Encounter (HOSPITAL_COMMUNITY)
Admission: RE | Admit: 2012-04-09 | Discharge: 2012-04-09 | Disposition: A | Discharge status: Home / Self Care | Payer: Medicare Other | Source: Ambulatory Visit | Attending: Cardiology | Admitting: Cardiology

## 2012-04-12 ENCOUNTER — Encounter (HOSPITAL_COMMUNITY): Payer: Medicare Other

## 2012-04-14 ENCOUNTER — Encounter (HOSPITAL_COMMUNITY)
Admission: RE | Admit: 2012-04-14 | Discharge: 2012-04-14 | Disposition: A | Discharge status: Home / Self Care | Payer: Medicare Other | Source: Ambulatory Visit | Attending: Cardiology | Admitting: Cardiology

## 2012-04-16 ENCOUNTER — Encounter (HOSPITAL_COMMUNITY)
Admission: RE | Admit: 2012-04-16 | Discharge: 2012-04-16 | Disposition: A | Discharge status: Home / Self Care | Payer: Medicare Other | Source: Ambulatory Visit | Attending: Cardiology | Admitting: Cardiology

## 2012-04-19 ENCOUNTER — Encounter (HOSPITAL_COMMUNITY)
Admission: RE | Admit: 2012-04-19 | Discharge: 2012-04-19 | Disposition: A | Discharge status: Home / Self Care | Payer: Medicare Other | Source: Ambulatory Visit | Attending: Cardiology | Admitting: Cardiology

## 2012-04-19 DIAGNOSIS — I10 Essential (primary) hypertension: Secondary | ICD-10-CM | POA: Insufficient documentation

## 2012-04-19 DIAGNOSIS — R011 Cardiac murmur, unspecified: Secondary | ICD-10-CM | POA: Insufficient documentation

## 2012-04-19 DIAGNOSIS — Z5189 Encounter for other specified aftercare: Secondary | ICD-10-CM | POA: Insufficient documentation

## 2012-04-19 DIAGNOSIS — I4891 Unspecified atrial fibrillation: Secondary | ICD-10-CM | POA: Insufficient documentation

## 2012-04-19 DIAGNOSIS — E7800 Pure hypercholesterolemia, unspecified: Secondary | ICD-10-CM | POA: Insufficient documentation

## 2012-04-19 DIAGNOSIS — Z9889 Other specified postprocedural states: Secondary | ICD-10-CM | POA: Insufficient documentation

## 2012-04-19 DIAGNOSIS — I4949 Other premature depolarization: Secondary | ICD-10-CM | POA: Insufficient documentation

## 2012-04-19 DIAGNOSIS — I059 Rheumatic mitral valve disease, unspecified: Secondary | ICD-10-CM | POA: Insufficient documentation

## 2012-04-21 ENCOUNTER — Encounter (HOSPITAL_COMMUNITY)
Admission: RE | Admit: 2012-04-21 | Discharge: 2012-04-21 | Disposition: A | Discharge status: Home / Self Care | Payer: Medicare Other | Source: Ambulatory Visit | Attending: Cardiology | Admitting: Cardiology

## 2012-04-23 ENCOUNTER — Encounter (HOSPITAL_COMMUNITY): Payer: Medicare Other

## 2012-04-26 ENCOUNTER — Encounter (HOSPITAL_COMMUNITY)
Admission: RE | Admit: 2012-04-26 | Discharge: 2012-04-26 | Disposition: A | Discharge status: Home / Self Care | Payer: Medicare Other | Source: Ambulatory Visit | Attending: Cardiology | Admitting: Cardiology

## 2012-04-28 ENCOUNTER — Encounter (HOSPITAL_COMMUNITY)
Admission: RE | Admit: 2012-04-28 | Discharge: 2012-04-28 | Disposition: A | Discharge status: Home / Self Care | Payer: Medicare Other | Source: Ambulatory Visit | Attending: Cardiology | Admitting: Cardiology

## 2012-04-30 ENCOUNTER — Encounter (HOSPITAL_COMMUNITY)
Admission: RE | Admit: 2012-04-30 | Discharge: 2012-04-30 | Disposition: A | Discharge status: Home / Self Care | Payer: Medicare Other | Source: Ambulatory Visit | Attending: Cardiology | Admitting: Cardiology

## 2012-05-03 ENCOUNTER — Encounter (HOSPITAL_COMMUNITY): Payer: Medicare Other

## 2012-05-05 ENCOUNTER — Encounter (HOSPITAL_COMMUNITY): Admission: RE | Admit: 2012-05-05 | Payer: Medicare Other | Source: Ambulatory Visit

## 2012-05-07 ENCOUNTER — Encounter (HOSPITAL_COMMUNITY): Payer: Medicare Other

## 2012-05-10 ENCOUNTER — Encounter (HOSPITAL_COMMUNITY)
Admission: RE | Admit: 2012-05-10 | Discharge: 2012-05-10 | Disposition: A | Discharge status: Home / Self Care | Payer: Medicare Other | Source: Ambulatory Visit | Attending: Cardiology | Admitting: Cardiology

## 2012-05-10 NOTE — Progress Notes (Signed)
Brief Nutrition Note Pt c/o of not being able to gain wt. Pt wt reportedly 181#, which is down 19# from pt UBW of 200#. Pt is 90% UBW. Pt eats 3 meals/day and drinks Boost Plus daily. Pt again encouraged to eat more frequently and increase kcal at meals by adding oil, nuts, avocado, etc. Pt given handout re: Wt gain. Pt expressed understanding of above. Continue client-centered nutrition education by RD as part of interdisciplinary care.  Monitor and evaluate progress toward nutrition goal with team.

## 2012-05-12 ENCOUNTER — Encounter (HOSPITAL_COMMUNITY)
Admission: RE | Admit: 2012-05-12 | Discharge: 2012-05-12 | Disposition: A | Discharge status: Home / Self Care | Payer: Medicare Other | Source: Ambulatory Visit | Attending: Cardiology | Admitting: Cardiology

## 2012-05-14 ENCOUNTER — Encounter (HOSPITAL_COMMUNITY)
Admission: RE | Admit: 2012-05-14 | Discharge: 2012-05-14 | Disposition: A | Discharge status: Home / Self Care | Payer: Medicare Other | Source: Ambulatory Visit | Attending: Cardiology | Admitting: Cardiology

## 2012-05-17 ENCOUNTER — Encounter (HOSPITAL_COMMUNITY)
Admission: RE | Admit: 2012-05-17 | Discharge: 2012-05-17 | Disposition: A | Discharge status: Home / Self Care | Payer: Medicare Other | Source: Ambulatory Visit | Attending: Cardiology | Admitting: Cardiology

## 2012-05-17 DIAGNOSIS — E7800 Pure hypercholesterolemia, unspecified: Secondary | ICD-10-CM | POA: Insufficient documentation

## 2012-05-17 DIAGNOSIS — Z5189 Encounter for other specified aftercare: Secondary | ICD-10-CM | POA: Insufficient documentation

## 2012-05-17 DIAGNOSIS — I4891 Unspecified atrial fibrillation: Secondary | ICD-10-CM | POA: Insufficient documentation

## 2012-05-17 DIAGNOSIS — R011 Cardiac murmur, unspecified: Secondary | ICD-10-CM | POA: Insufficient documentation

## 2012-05-17 DIAGNOSIS — I059 Rheumatic mitral valve disease, unspecified: Secondary | ICD-10-CM | POA: Insufficient documentation

## 2012-05-17 DIAGNOSIS — I4949 Other premature depolarization: Secondary | ICD-10-CM | POA: Insufficient documentation

## 2012-05-17 DIAGNOSIS — I10 Essential (primary) hypertension: Secondary | ICD-10-CM | POA: Insufficient documentation

## 2012-05-17 DIAGNOSIS — Z9889 Other specified postprocedural states: Secondary | ICD-10-CM | POA: Insufficient documentation

## 2012-05-19 ENCOUNTER — Encounter (HOSPITAL_COMMUNITY)
Admission: RE | Admit: 2012-05-19 | Discharge: 2012-05-19 | Disposition: A | Discharge status: Home / Self Care | Payer: Medicare Other | Source: Ambulatory Visit | Attending: Cardiology | Admitting: Cardiology

## 2012-05-21 ENCOUNTER — Encounter (HOSPITAL_COMMUNITY)
Admission: RE | Admit: 2012-05-21 | Discharge: 2012-05-21 | Disposition: A | Discharge status: Home / Self Care | Payer: Medicare Other | Source: Ambulatory Visit | Attending: Cardiology | Admitting: Cardiology

## 2012-05-24 ENCOUNTER — Encounter (HOSPITAL_COMMUNITY)
Admission: RE | Admit: 2012-05-24 | Discharge: 2012-05-24 | Disposition: A | Discharge status: Home / Self Care | Payer: Medicare Other | Source: Ambulatory Visit | Attending: Cardiology | Admitting: Cardiology

## 2012-05-26 ENCOUNTER — Encounter (HOSPITAL_COMMUNITY)
Admission: RE | Admit: 2012-05-26 | Discharge: 2012-05-26 | Disposition: A | Discharge status: Home / Self Care | Payer: Medicare Other | Source: Ambulatory Visit | Attending: Cardiology | Admitting: Cardiology

## 2012-05-28 ENCOUNTER — Encounter: Payer: Self-pay | Admitting: Internal Medicine

## 2012-05-28 ENCOUNTER — Encounter (HOSPITAL_COMMUNITY)
Admission: RE | Admit: 2012-05-28 | Discharge: 2012-05-28 | Disposition: A | Discharge status: Home / Self Care | Payer: Medicare Other | Source: Ambulatory Visit | Attending: Cardiology | Admitting: Cardiology

## 2012-05-28 ENCOUNTER — Encounter (HOSPITAL_COMMUNITY): Payer: Self-pay

## 2012-05-31 NOTE — Progress Notes (Signed)
Cardiac Rehabilitation Program Outcomes Report Orientation:  02/19/2012 Graduate Date:  05/28/2012 Discharge Date:   # of sessions completed: 36  Cardiologist: Patty Sermons Family MD:   Class Time:  1315  A.  Exercise Program:  Tolerates exercise @ 2.7 METS for 30 minutes, Improved functional capacity  18.75 %, Improved  muscular strength  2.78 %, Improved  flexibility 5.0 % and Discharged to home exercise program.  Anticipated compliance:  fair  B.  Mental Health:  C.  Education/Instruction/Skills  Accurately checks own pulse, Knows THR for exercise, Uses Perceived Exertion Scale and Attended 7 education classes  D.  Nutrition/Weight Control/Body Composition: Pt following a step 1 Therapeutic Lifestyle Changes diet. Pt with 1.9% decrease in %body fat. Pt wt is down 0.4 kg. Wt loss not indicated. BMI 22.5 and % body fat 25.6%. Section Completed by: Mickle Plumb, M.Ed, RD, LDN, CDE   E.  Blood Lipids Lab Results  Component Value Date   CHOL 119 03/26/2012   HDL 41.50 03/26/2012   LDLCALC 66 03/26/2012   TRIG 58.0 03/26/2012   CHOLHDL 3 03/26/2012   F.  Lifestyle Changes:   G.  Symptoms noted with exercise:  Musculoskeletal problems, Fatigue and Exertional hypertension  Report Completed By:  Electronically signed by Harriett Sine MS on Monday May 31 2012 at 0808  Comments:            Pt successfully completed cardiac rehab attending 36/36 exercise and 7 education sessions. Pt had good participation.   Pt VSS, Telemetry-Sinus rhythm, first degree AV block, occasional PAC and PVC.  Pt METS increased from 2.3 at baseline to 2.7 upon completion of program. Pt plans to exercise by participating in cardiac rehab maintenance program.  Pt did not have hospital admission during cardiac rehab period.  Pt has made positive lifestyle changes and should be commended for his efforts.  Pt was a pleasure to work with.  Thank you for the referral.

## 2012-06-07 ENCOUNTER — Telehealth: Payer: Self-pay | Admitting: Cardiology

## 2012-06-07 NOTE — Telephone Encounter (Signed)
Sent staff message nothing received to resend tomorrow to my attention.

## 2012-06-07 NOTE — Telephone Encounter (Signed)
Faxed referral for cardiac rehab, do you receive?

## 2012-06-09 NOTE — Telephone Encounter (Signed)
Received and waiting for  Dr. Patty Sermons to sign

## 2012-06-18 ENCOUNTER — Encounter (HOSPITAL_COMMUNITY)
Admission: RE | Admit: 2012-06-18 | Discharge: 2012-06-18 | Disposition: A | Discharge status: Home / Self Care | Payer: Self-pay | Source: Ambulatory Visit | Attending: Cardiology | Admitting: Cardiology

## 2012-06-18 ENCOUNTER — Ambulatory Visit: Payer: Self-pay | Admitting: Cardiology

## 2012-06-18 DIAGNOSIS — E7800 Pure hypercholesterolemia, unspecified: Secondary | ICD-10-CM | POA: Insufficient documentation

## 2012-06-18 DIAGNOSIS — I4891 Unspecified atrial fibrillation: Secondary | ICD-10-CM | POA: Insufficient documentation

## 2012-06-18 DIAGNOSIS — Z9889 Other specified postprocedural states: Secondary | ICD-10-CM | POA: Insufficient documentation

## 2012-06-18 DIAGNOSIS — Z5189 Encounter for other specified aftercare: Secondary | ICD-10-CM | POA: Insufficient documentation

## 2012-06-18 DIAGNOSIS — Z7901 Long term (current) use of anticoagulants: Secondary | ICD-10-CM

## 2012-06-18 DIAGNOSIS — R011 Cardiac murmur, unspecified: Secondary | ICD-10-CM | POA: Insufficient documentation

## 2012-06-18 DIAGNOSIS — I059 Rheumatic mitral valve disease, unspecified: Secondary | ICD-10-CM | POA: Insufficient documentation

## 2012-06-18 DIAGNOSIS — I4949 Other premature depolarization: Secondary | ICD-10-CM | POA: Insufficient documentation

## 2012-06-18 DIAGNOSIS — I10 Essential (primary) hypertension: Secondary | ICD-10-CM | POA: Insufficient documentation

## 2012-06-21 ENCOUNTER — Encounter (HOSPITAL_COMMUNITY): Payer: Self-pay

## 2012-06-23 ENCOUNTER — Encounter (HOSPITAL_COMMUNITY)
Admission: RE | Admit: 2012-06-23 | Discharge: 2012-06-23 | Disposition: A | Discharge status: Home / Self Care | Payer: Self-pay | Source: Ambulatory Visit | Attending: Cardiology | Admitting: Cardiology

## 2012-06-25 ENCOUNTER — Emergency Department (HOSPITAL_COMMUNITY)
Admission: EM | Admit: 2012-06-25 | Discharge: 2012-06-25 | Disposition: A | Discharge status: Home / Self Care | Payer: Medicare Other | Attending: Emergency Medicine | Admitting: Emergency Medicine

## 2012-06-25 ENCOUNTER — Encounter (HOSPITAL_COMMUNITY): Payer: Self-pay | Admitting: Emergency Medicine

## 2012-06-25 ENCOUNTER — Encounter (HOSPITAL_COMMUNITY)
Admission: RE | Admit: 2012-06-25 | Discharge: 2012-06-25 | Disposition: A | Discharge status: Home / Self Care | Payer: Self-pay | Source: Ambulatory Visit | Attending: Cardiology | Admitting: Cardiology

## 2012-06-25 ENCOUNTER — Telehealth: Payer: Self-pay | Admitting: Internal Medicine

## 2012-06-25 DIAGNOSIS — Z8546 Personal history of malignant neoplasm of prostate: Secondary | ICD-10-CM | POA: Insufficient documentation

## 2012-06-25 DIAGNOSIS — F3289 Other specified depressive episodes: Secondary | ICD-10-CM | POA: Insufficient documentation

## 2012-06-25 DIAGNOSIS — M25569 Pain in unspecified knee: Secondary | ICD-10-CM | POA: Insufficient documentation

## 2012-06-25 DIAGNOSIS — Z85820 Personal history of malignant melanoma of skin: Secondary | ICD-10-CM | POA: Insufficient documentation

## 2012-06-25 DIAGNOSIS — E7800 Pure hypercholesterolemia, unspecified: Secondary | ICD-10-CM | POA: Insufficient documentation

## 2012-06-25 DIAGNOSIS — F329 Major depressive disorder, single episode, unspecified: Secondary | ICD-10-CM | POA: Insufficient documentation

## 2012-06-25 DIAGNOSIS — Y9210 Unspecified residential institution as the place of occurrence of the external cause: Secondary | ICD-10-CM | POA: Insufficient documentation

## 2012-06-25 DIAGNOSIS — Z7982 Long term (current) use of aspirin: Secondary | ICD-10-CM | POA: Insufficient documentation

## 2012-06-25 DIAGNOSIS — W19XXXA Unspecified fall, initial encounter: Secondary | ICD-10-CM | POA: Insufficient documentation

## 2012-06-25 DIAGNOSIS — Z79899 Other long term (current) drug therapy: Secondary | ICD-10-CM | POA: Insufficient documentation

## 2012-06-25 DIAGNOSIS — S0990XA Unspecified injury of head, initial encounter: Secondary | ICD-10-CM | POA: Insufficient documentation

## 2012-06-25 DIAGNOSIS — I10 Essential (primary) hypertension: Secondary | ICD-10-CM | POA: Insufficient documentation

## 2012-06-25 NOTE — ED Notes (Signed)
Pt had witnessed fall while in cardiac rehab today when left knee gave out; no LOC; pt did hit head; pt sts some mild left knee pain as only complaint

## 2012-06-25 NOTE — ED Provider Notes (Signed)
History   This chart was scribed for Celene Kras, MD by Toya Smothers. The patient was seen in room TR05C/TR05C. Patient's care was started at 1521.  CSN: [id number]  Arrival date & time 06/25/12  1521   First MD Initiated Contact with Patient 06/25/12 1540      Chief Complaint  Patient presents with  . Fall   HPI  [name] [name] is a 78 y.o. male with a history of L knee arthritis who presents to the ED after falling during cardiac rehabilitation one hour ago. Pt reports that his left knee gave out while he was walking causing him to fall forward. Pt did hit his head but denies LOC, HA, nausea, emesis. Pt also complains of mild waning left knee pain as the result of fall. He has had this before and has seen an orthopedist who has told him that the cartilage has worn out.  Pt list current medication of daily ASA.     Past Medical History  Diagnosis Date  . Prostate cancer   . Melanoma     Left Shoulder  . Vision abnormalities     Cornea scarring  . Osteoarthritis     Knees  . Hypertension   . Pure hypercholesterolemia   . Depressive disorder, not elsewhere classified   . MVP (mitral valve prolapse)   . Personal history of colonic polyps   . Valvular heart disease     Last echo in 2010 with EF 55 to 60%, +MVP, mild MR, diastolic dysfunction, biatrial enlargement, mild AI and mild pulmonary HTN  . Normal nuclear stress test 2007  . Thrombocytopenia   . Mitral regurgitation   . MVP (mitral valve prolapse)   . PVC (premature ventricular contraction)   . Shortness of breath   . Blood transfusion   . Neuropathy   . Wears glasses   . Heart murmur     Dr Patty Sermons cardiologist    Past Surgical History  Procedure Date  . Nuclear stress test 09/2006    EF-64%, Normal  . US echocardiography 09/2009, 08/1011    mild LVH,mild AI,MVP with mild MR, mild-mod. TR with mild Pulm. HTN, EF-55-60%  . Inguinal hernia repair 09/2009    Left  . Knee arthroscopy     x 2  . Colonoscopy  w/ polypectomy   . Prostatectomy 2003  . Rotator cuff repair 2003    left  . Melanoma surgery     2001, 2005, 2006, 2009  . Tee without cardioversion 09/26/2011    Procedure: TRANSESOPHAGEAL ECHOCARDIOGRAM (TEE);  Surgeon: Lewayne Bunting, MD;  Location: [facility] ENDOSCOPY;  Service: Cardiovascular;  Laterality: N/A;  . Mitral valve repair 10/01/2011    complex valvuloplasty with Goretex cord replacement and chordal transposition 32mm Sorin Memo 3D ring annuloplasty    Family History  Problem Relation Age of Onset  . Clotting disorder Brother     CVA's  . Arthritis Mother   . Hypertension Mother   . Hypertension Father   . Psychosis Father     psychiatric care  . Stroke Mother     History  Substance Use Topics  . Smoking status: Never Smoker   . Smokeless tobacco: Never Used  . Alcohol Use: No      Review of Systems  Constitutional: Negative for fever.  Musculoskeletal: Positive for arthralgias (L knee).  Neurological: Negative for seizures, syncope, speech difficulty, weakness, light-headedness and headaches.  All other systems reviewed and are negative.    Allergies  Review of patient's allergies indicates no known allergies.  Home Medications   Current Outpatient Rx  Name Route Sig Dispense Refill  . ACETAMINOPHEN 325 MG PO TABS Oral Take 325 mg by mouth as needed.    . ASCORBIC ACID 500 MG PO TABS Oral Take 500 mg by mouth 2 (two) times daily.    . ASPIRIN 81 MG PO TABS Oral Take 81 mg by mouth daily.    . ATORVASTATIN CALCIUM 10 MG PO TABS Oral Take 10 mg by mouth daily.      . CELECOXIB 200 MG PO CAPS Oral Take 200 mg by mouth daily.    . FUROSEMIDE 40 MG PO TABS Oral Take 40 mg by mouth daily.     Marland Kitchen GLUCOSAMINE CHONDR COMPLEX PO Oral Take by mouth 3 (three) times daily.    Marland Kitchen BOOST PO LIQD Oral Take 1 Container by mouth daily.    Marland Kitchen METOPROLOL TARTRATE 25 MG PO TABS Oral Take 1 tablet (25 mg total) by mouth 2 (two) times daily. 180 tablet 3  . THERA M PLUS PO  TABS Oral Take 1 tablet by mouth daily. 30 each   . POTASSIUM CHLORIDE CRYS ER 20 MEQ PO TBCR Oral Take 1 tablet (20 mEq total) by mouth 3 (three) times daily. 270 tablet 3  . RESTASIS 0.05 % OP EMUL Both Eyes Place 1 drop into both eyes every 12 (twelve) hours.     . TRAZODONE HCL 100 MG PO TABS Oral Take 150 mg by mouth at bedtime. Take 3 tablets daily at bedtime      BP 134/79  Pulse 72  Temp 97.8 F (36.6 C)  Resp 18  SpO2 99%  Physical Exam  Nursing note and vitals reviewed. Constitutional: He appears well-developed and well-nourished. No distress.  HENT:  Head: Normocephalic and atraumatic.  Right Ear: External ear normal.  Left Ear: External ear normal.       No sceptical hematoma.  Eyes: Conjunctivae are normal. Right eye exhibits no discharge. Left eye exhibits no discharge. No scleral icterus.  Neck: Neck supple. No tracheal deviation present.  Cardiovascular: Normal rate, regular rhythm and intact distal pulses.   Pulmonary/Chest: Effort normal and breath sounds normal. No stridor. No respiratory distress. He has no wheezes. He has no rales.  Abdominal: Soft. Bowel sounds are normal. He exhibits no distension. There is no tenderness. There is no rebound and no guarding.  Musculoskeletal: He exhibits no edema and no tenderness.       No C spine tenderness. Full ROM in L knee.  Neurological: He is alert. He has normal strength. No sensory deficit. Cranial nerve deficit:  no gross defecits noted. He exhibits normal muscle tone. He displays no seizure activity. Coordination normal.  Skin: Skin is warm and dry. No rash noted.  Psychiatric: He has a normal mood and affect.    ED Course  Procedures (including critical care time) DIAGNOSTIC STUDIES: Oxygen Saturation is 99% on room air, normal by my interpretation.    COORDINATION OF CARE: 1545- Evaluated Pt. Pt is without distress. Alert and oriented at time of examination. 1546- Advised Pt to monitor, and report to ED if  any complications arise.  Labs Reviewed - No data to display No results found.   1. Fall   2. Minor head injury       MDM  The patient was instructed to come to the ED for evaluation by the staff and the cardiac rehabilitation unit. He has no  signs of serious injury. He only takes aspirin but does not take Coumadin or Plavix. He has mild symptoms and I do not feel CT imaging is necessary at this time. He does not have any significant tenderness in his left knee at this time. There is full range of motion without deformity. X-ray imaging is not necessary at this time.   I personally performed the services described in this documentation, which was scribed in my presence.  The recorded information has been reviewed and considered.        Celene Kras, MD 06/25/12 [phone]

## 2012-06-25 NOTE — Telephone Encounter (Signed)
Caller: Isaac Laud; Patient Name: [name] [name]; PCP: Ethelene Hal Molly Maduro); Best Callback Phone Number: [phone]. Call from [place], [place] @ Cardiac Rehab/ [facility]. Health history, medications, and allergies reviewed: Yes (in EPIC). 06/25/12 - Fall @ 1:50 pm while at Cardiac Rehab during exercises. Behind Left Ear raised and red, no pain. No bruising seen yet, but on blood thinners.  Put ice on the back of his ear to help swelling. Emergent Sign/Symptom of "head injury AND 26 years of age or older" per Head Injury Protocol. Disposition : See in Emergency Room. Consuelo Pandy, RN to have someone from Cardiac Rehab take him down to the Emergency Room for evaluation. Patient Agreed to let someone take him down there.

## 2012-06-25 NOTE — Progress Notes (Signed)
Cardiac Rehab Maintenance Program  Pt fell while attempting to dismount stepper today with complaint of "my left knee gave out on me". Pt stated that he hit his head and knee when he fell, a reddened area was noted behind left ear.  Ice was applied to pt left knee and behind left ear. Pt stated "some" pain in left knee. Pt was taken to treatment room where ice was applied for 20 minutes. Pt denied pain after ice treatment.  MD was called and RN was instructed to take pt to ED for treatment due to pt age and medications. Family was called, RN spoke with Darl Pikes (daughter) and updated that pt would be taken to ED.  Vital signs on admit to program 116/64 - 72 post fall 140/80 15 minutes post fall 108/70 - 75.     Rosalie Doctor

## 2012-06-28 ENCOUNTER — Encounter (HOSPITAL_COMMUNITY): Payer: Self-pay

## 2012-06-29 ENCOUNTER — Ambulatory Visit (INDEPENDENT_AMBULATORY_CARE_PROVIDER_SITE_OTHER): Payer: Medicare Other | Admitting: Cardiology

## 2012-06-29 ENCOUNTER — Encounter: Payer: Self-pay | Admitting: Cardiology

## 2012-06-29 VITALS — BP 110/70 | HR 78 | Ht 77.0 in | Wt 185.8 lb

## 2012-06-29 DIAGNOSIS — Z9889 Other specified postprocedural states: Secondary | ICD-10-CM

## 2012-06-29 DIAGNOSIS — I4949 Other premature depolarization: Secondary | ICD-10-CM

## 2012-06-29 DIAGNOSIS — I4891 Unspecified atrial fibrillation: Secondary | ICD-10-CM

## 2012-06-29 DIAGNOSIS — E7800 Pure hypercholesterolemia, unspecified: Secondary | ICD-10-CM

## 2012-06-29 DIAGNOSIS — I493 Ventricular premature depolarization: Secondary | ICD-10-CM

## 2012-06-29 MED ORDER — METOPROLOL SUCCINATE ER 50 MG PO TB24: 50.0000 mg | ORAL_TABLET | Freq: Every day | ORAL | Status: DC

## 2012-06-29 NOTE — Patient Instructions (Addendum)
Stop Lopressor and start Toprol when you get if from [facility]  Your physician recommends that you schedule a follow-up appointment in: 3 months with fasting labs (LP/BMET/HFP/EKG)

## 2012-06-29 NOTE — Assessment & Plan Note (Signed)
Patient has had no recurrence of his atrial fib.  He is having PVCs which have been a chronic problem for him and we will change his Lopressor to Toprol-XL which is what he used to take for his PVC suppression

## 2012-06-29 NOTE — Assessment & Plan Note (Signed)
The patient has a history of hypercholesterolemia.  We will plan to check fasting lipids at his next visit.  he is on a low cholesterol diet and on low-dose Lipitor

## 2012-06-29 NOTE — Assessment & Plan Note (Signed)
The patient has not had any symptoms of congestive heart failure.  His exercise tolerance is good in the rehabilitation program

## 2012-06-29 NOTE — Progress Notes (Signed)
[name] [name] Date of Birth:  05-Feb-1931 [facility] [address] [place], [place]  [address] [phone]         Fax   [phone]  History of Present Illness: This pleasant 78 year old gentleman is seen for a scheduled followup office visit.  He has a past history of severe mitral valve prolapse.  He underwent mitral valve repair by Dr. Cornelius Moras on 10/01/11.  He did not have any coronary disease.  He had a prolonged hospital course but eventually recovered and is now finished the cardiac rehabilitation program and is in the cardiac maintenance rehabilitation program.  A previous decubitus ulcer has completely healed up.  He remains in normal sinus rhythm after being in atrial fibrillation postoperatively.  He is no longer on warfarin but does take baby aspirin daily.  He notes occasional skipped beats usually when it is time for him to take his nighttime dose of Lopressor  Current Outpatient Prescriptions  Medication Sig Dispense Refill  . acetaminophen (TYLENOL) 325 MG tablet Take 325 mg by mouth every 6 (six) hours as needed. For pain      . ascorbic acid (VITAMIN C) 500 MG tablet Take 500 mg by mouth 2 (two) times daily.      Marland Kitchen aspirin 81 MG tablet Take 81 mg by mouth daily.      Marland Kitchen atorvastatin (LIPITOR) 10 MG tablet Take 10 mg by mouth daily.        . celecoxib (CELEBREX) 200 MG capsule Take 200 mg by mouth daily.      . furosemide (LASIX) 40 MG tablet Take 40 mg by mouth daily.       . Glucosamine-Chondroitin (GLUCOSAMINE CHONDR COMPLEX PO) Take by mouth 3 (three) times daily.      Marland Kitchen lactose free nutrition (BOOST) LIQD Take 1 Container by mouth daily.      . Multiple Vitamins-Minerals (MULTIVITAMINS THER. W/MINERALS) TABS Take 1 tablet by mouth daily.  30 each    . potassium chloride SA (K-DUR,KLOR-CON) 20 MEQ tablet Take 1 tablet (20 mEq total) by mouth 3 (three) times daily.  270 tablet  3  . RESTASIS 0.05 % ophthalmic emulsion Place 1 drop into both eyes every  12 (twelve) hours.       . traZODone (DESYREL) 100 MG tablet Take 300 mg by mouth at bedtime.       . metoprolol succinate (TOPROL-XL) 50 MG 24 hr tablet Take 1 tablet (50 mg total) by mouth daily.  90 tablet  3  . DISCONTD: furosemide (LASIX) 40 MG tablet Take 1 tablet (40 mg total) by mouth 2 (two) times daily.      Marland Kitchen DISCONTD: pantoprazole (PROTONIX) 40 MG tablet Take 1 tablet (40 mg total) by mouth daily before breakfast.      . DISCONTD: potassium chloride SA (K-DUR,KLOR-CON) 20 MEQ tablet Take 2 tablets (40 mEq total) by mouth daily.        No Known Allergies  Patient Active Problem List  Diagnosis  . TINEA PEDIS  . ADENOCARCINOMA, PROSTATE  . HYPERCHOLESTEROLEMIA  . THROMBOCYTOPENIA  . DEPRESSION  . HYPERTENSION, BENIGN ESSENTIAL  . GERD  . DERMATITIS, ATOPIC  . CALLUS, LEFT FOOT  . OSTEOARTHRITIS, GENERALIZED, MULTIPLE JOINTS  . BACK PAIN, CHRONIC  . MUSCLE SPASM, BACK  . PERSONAL HISTORY MALIGNANT NEOPLASM PROSTATE  . PERSONAL HISTORY OF MALIGNANT MELANOMA OF SKIN  . ARRHYTHMIA, HX OF  . PERSONAL HISTORY OF COLONIC POLYPS  . Cerumen impaction  . Hearing  loss  . Valvular heart disease  . S/P mitral valve repair  . Atrial fibrillation  . Pneumothorax, postoperative  . Decubitus skin ulcer  . Pleural effusion due to congestive heart failure  . Encounter for long-term (current) use of anticoagulants  . Hx of mitral valve repair  . Cough    History  Smoking status  . Never Smoker   Smokeless tobacco  . Never Used    History  Alcohol Use No    Family History  Problem Relation Age of Onset  . Clotting disorder Brother     CVA's  . Arthritis Mother   . Hypertension Mother   . Hypertension Father   . Psychosis Father     psychiatric care  . Stroke Mother     Review of Systems: Constitutional: no fever chills diaphoresis or fatigue or change in weight.  Head and neck: no hearing loss, no epistaxis, no photophobia or visual disturbance. Respiratory: No  cough, shortness of breath or wheezing. Cardiovascular: No chest pain peripheral edema, palpitations. Gastrointestinal: No abdominal distention, no abdominal pain, no change in bowel habits hematochezia or melena. Genitourinary: No dysuria, no frequency, no urgency, no nocturia. Musculoskeletal:No arthralgias, no back pain, no gait disturbance or myalgias. Neurological: No dizziness, no headaches, no numbness, no seizures, no syncope, no weakness, no tremors. Hematologic: No lymphadenopathy, no easy bruising. Psychiatric: No confusion, no hallucinations, no sleep disturbance.    Physical Exam: Filed Vitals:   06/29/12 1033  BP: 110/70  Pulse: 78   the general appearance reveals a tall elderly gentleman in no distress.The head and neck exam reveals pupils equal and reactive.  Extraocular movements are full.  There is no scleral icterus.  The mouth and pharynx are normal.  The neck is supple.  The carotids reveal no bruits.  The jugular venous pressure is normal.  The  thyroid is not enlarged.  There is no lymphadenopathy.  The chest is clear to percussion and auscultation.  There are no rales or rhonchi.  Expansion of the chest is symmetrical.  The precordium is quiet.  The first heart sound is normal.  The second heart sound is physiologically split.  There is no murmur gallop rub or click.  There is no abnormal lift or heave.  The abdomen is soft and nontender.  The bowel sounds are normal.  The liver and spleen are not enlarged.  There are no abdominal masses.  There are no abdominal bruits.  Extremities reveal good pedal pulses.  There is no phlebitis or edema.  There is no cyanosis or clubbing.  Strength is normal and symmetrical in all extremities.  There is no lateralizing weakness.  There are no sensory deficits.  The skin is warm and dry.  There is no rash.    Assessment / Plan: Continue same medication.  Stop Lopressor and began Toprol-XL generic 50 mg one daily.  Recheck in 3 months  for office visit EKG and fasting lipid panel hepatic function panel and basal metabolic panel

## 2012-06-30 ENCOUNTER — Encounter (HOSPITAL_COMMUNITY)
Admission: RE | Admit: 2012-06-30 | Discharge: 2012-06-30 | Disposition: A | Discharge status: Home / Self Care | Payer: Self-pay | Source: Ambulatory Visit | Attending: Cardiology | Admitting: Cardiology

## 2012-07-02 ENCOUNTER — Encounter (HOSPITAL_COMMUNITY)
Admission: RE | Admit: 2012-07-02 | Discharge: 2012-07-02 | Disposition: A | Discharge status: Home / Self Care | Payer: Self-pay | Source: Ambulatory Visit | Attending: Cardiology | Admitting: Cardiology

## 2012-07-05 ENCOUNTER — Encounter (HOSPITAL_COMMUNITY)
Admission: RE | Admit: 2012-07-05 | Discharge: 2012-07-05 | Disposition: A | Discharge status: Home / Self Care | Payer: Self-pay | Source: Ambulatory Visit | Attending: Cardiology | Admitting: Cardiology

## 2012-07-07 ENCOUNTER — Encounter (HOSPITAL_COMMUNITY)
Admission: RE | Admit: 2012-07-07 | Discharge: 2012-07-07 | Disposition: A | Discharge status: Home / Self Care | Payer: Self-pay | Source: Ambulatory Visit | Attending: Cardiology | Admitting: Cardiology

## 2012-07-08 ENCOUNTER — Ambulatory Visit (AMBULATORY_SURGERY_CENTER): Payer: Medicare Other | Admitting: *Deleted

## 2012-07-08 ENCOUNTER — Telehealth: Payer: Self-pay | Admitting: *Deleted

## 2012-07-08 VITALS — Ht 77.0 in | Wt 188.8 lb

## 2012-07-08 DIAGNOSIS — Z1211 Encounter for screening for malignant neoplasm of colon: Secondary | ICD-10-CM

## 2012-07-08 MED ORDER — NA SULFATE-K SULFATE-MG SULF 17.5-3.13-1.6 GM/177ML PO SOLN: ORAL | Status: DC

## 2012-07-08 NOTE — Telephone Encounter (Signed)
Dr. Leone Payor: pt is 78 years old; former pt of Dr. Blossom Hoops; new to you.  He has a hx of adenomatous colon polyps; last colonoscopy with Dr. Doreatha Martin in 2007. (Reports are in EPIC). Pt had open heart surgery for mitral valve replacement 09/2011.  Last EF Dec 12/12 65-70%. Is doing maintenance cardiac rehab. Is this pt okay for direct colonoscopy or would you prefer to see him in office before colonoscopy?  Thanks, Olegario Messier

## 2012-07-09 ENCOUNTER — Encounter (HOSPITAL_COMMUNITY)
Admission: RE | Admit: 2012-07-09 | Discharge: 2012-07-09 | Disposition: A | Discharge status: Home / Self Care | Payer: Self-pay | Source: Ambulatory Visit | Attending: Cardiology | Admitting: Cardiology

## 2012-07-09 ENCOUNTER — Encounter: Payer: Self-pay | Admitting: Internal Medicine

## 2012-07-09 NOTE — Telephone Encounter (Signed)
OV please

## 2012-07-09 NOTE — Telephone Encounter (Signed)
Attempted to call pt to schedule Office Visit. No answer.  Left message for pt to call to schedule.

## 2012-07-12 ENCOUNTER — Encounter (HOSPITAL_COMMUNITY)
Admission: RE | Admit: 2012-07-12 | Discharge: 2012-07-12 | Disposition: A | Discharge status: Home / Self Care | Payer: Self-pay | Source: Ambulatory Visit | Attending: Cardiology | Admitting: Cardiology

## 2012-07-12 NOTE — Telephone Encounter (Signed)
Spoke with patient, made office visit 08/10/12 tue. At 10:00 am. Cancelled colonoscopy.

## 2012-07-13 IMAGING — CR DG CHEST 1V PORT
1 series · 1 of 1 positions shown · non-contrast
Comparison: 10/14/2011

CLINICAL DATA: CHF, CHF, shortness of breath.

PORTABLE CHEST - 1 VIEW

[view not recorded]
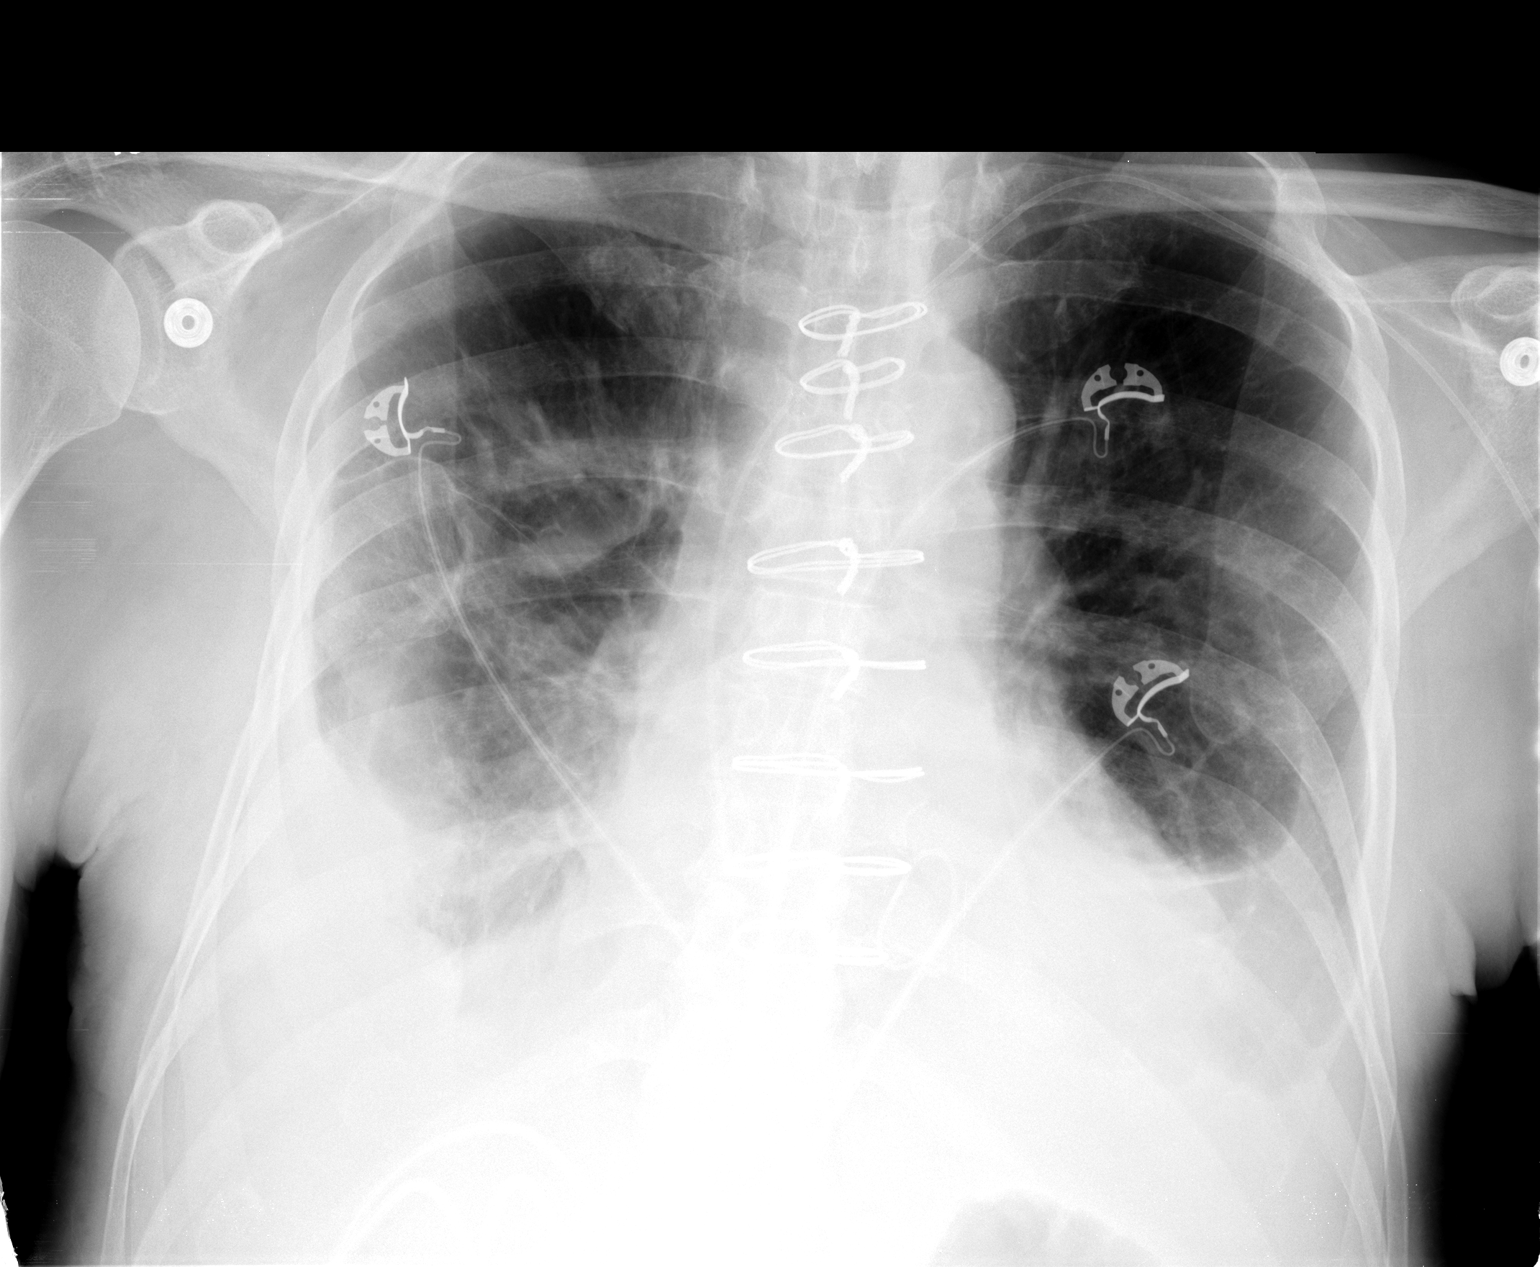

[1 of 1 positions shown; findings below may reference images not displayed]

FINDINGS: Bilateral pleural effusions, enlarging since prior study.
Bibasilar atelectasis or infiltrate.  Mild cardiomegaly.  Prior
CABG and valve replacement. Left PICC line remains in place with
the tip in the right atrium, unchanged.
IMPRESSION: Enlarging bilateral effusions and worsening bibasilar opacities.

## 2012-07-14 ENCOUNTER — Encounter (HOSPITAL_COMMUNITY)
Admission: RE | Admit: 2012-07-14 | Discharge: 2012-07-14 | Disposition: A | Discharge status: Home / Self Care | Payer: Self-pay | Source: Ambulatory Visit | Attending: Cardiology | Admitting: Cardiology

## 2012-07-14 ENCOUNTER — Other Ambulatory Visit: Payer: Self-pay | Admitting: Cardiology

## 2012-07-14 MED ORDER — ATORVASTATIN CALCIUM 10 MG PO TABS: 10.0000 mg | ORAL_TABLET | Freq: Every day | ORAL | Status: DC

## 2012-07-14 NOTE — Telephone Encounter (Signed)
Per pt call he needs a 90 day supply sent into [facility] all pharm info confirmed

## 2012-07-16 ENCOUNTER — Encounter (HOSPITAL_COMMUNITY)
Admission: RE | Admit: 2012-07-16 | Discharge: 2012-07-16 | Disposition: A | Discharge status: Home / Self Care | Payer: Self-pay | Source: Ambulatory Visit | Attending: Cardiology | Admitting: Cardiology

## 2012-07-18 IMAGING — US US THORACENTESIS
1 series · 5 of 5 positions shown · non-contrast
Comparison: None

CLINICAL DATA: Left pleural effusion

ULTRASOUND GUIDED left THORACENTESIS

[Series 1: us thoracentesis · 0.28mm/px · 5 of 5 slices shown]
[im 1/5]
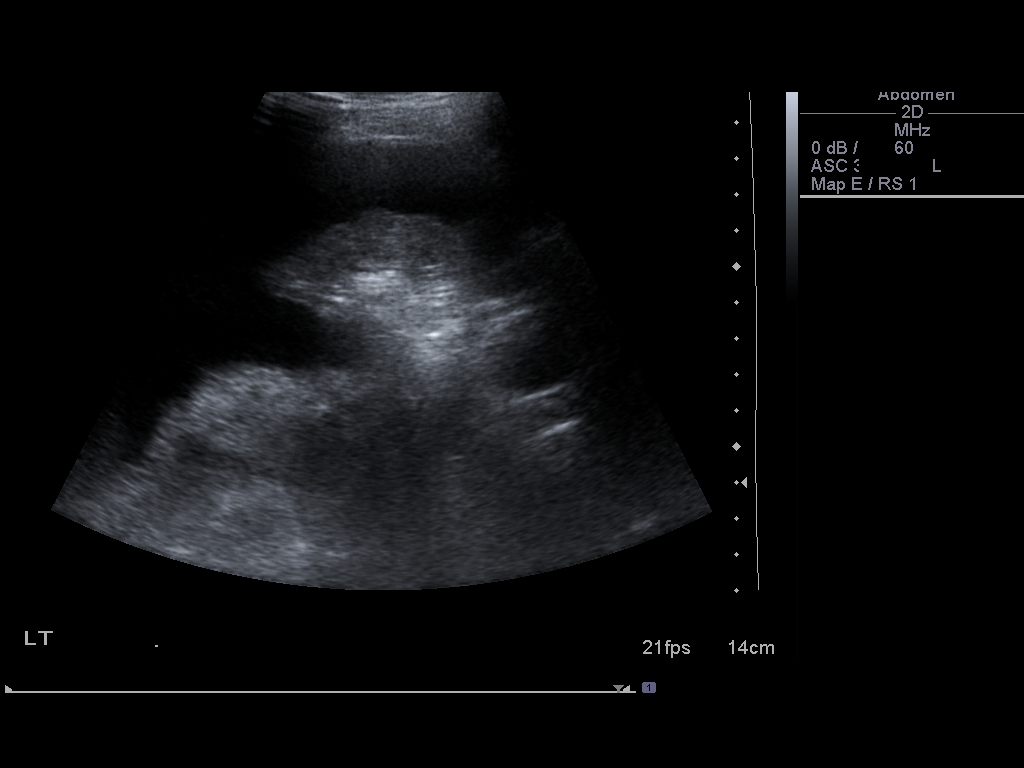
[im 2/5]
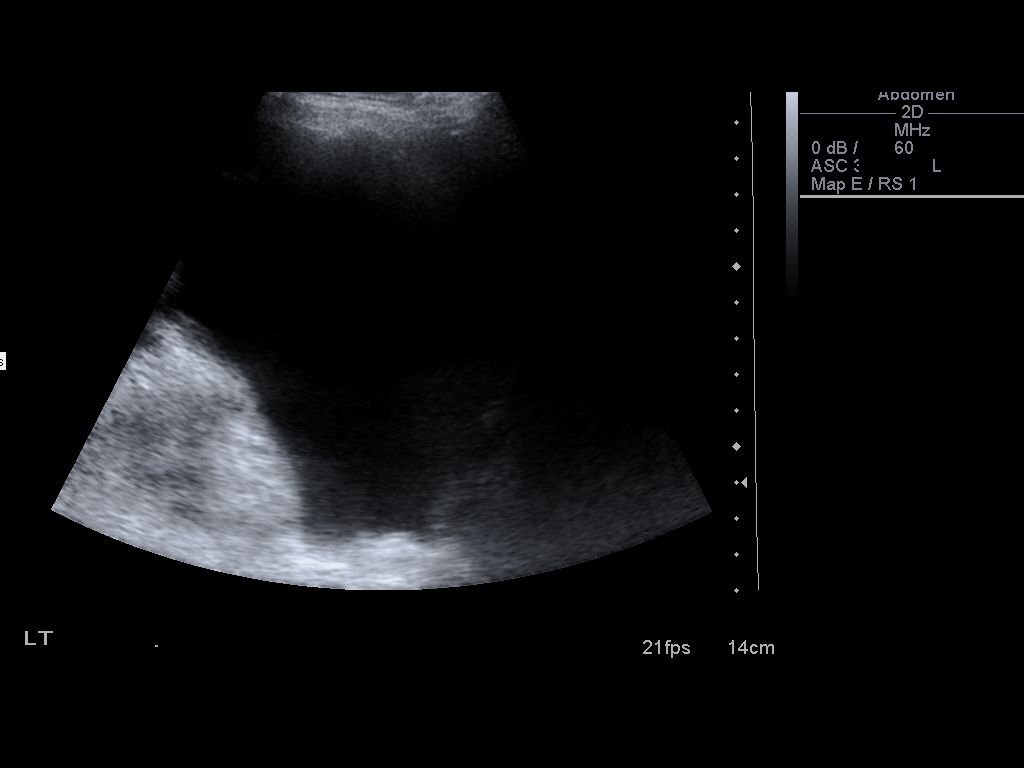
[im 3/5]
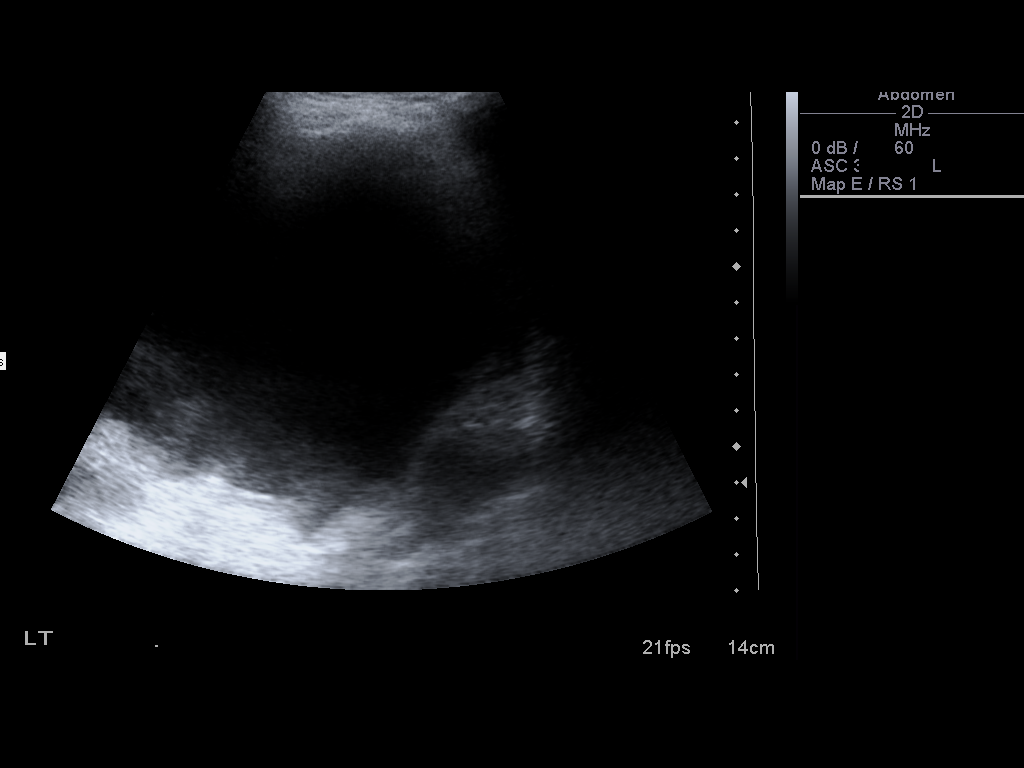
[im 4/5]
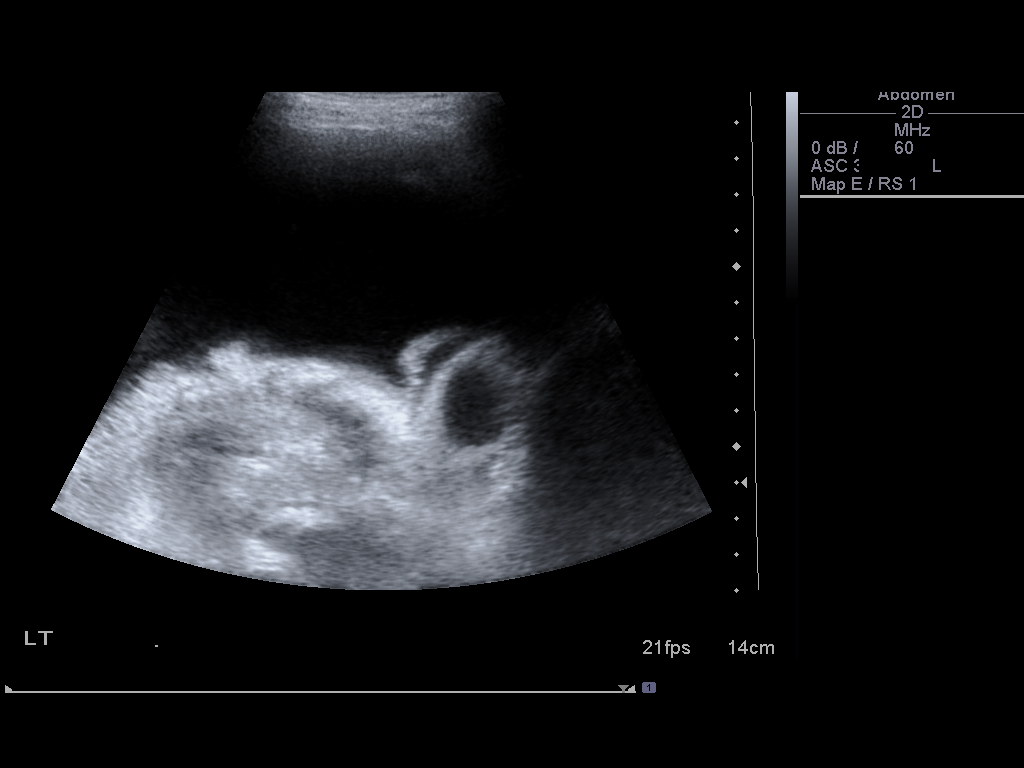
[im 5/5]
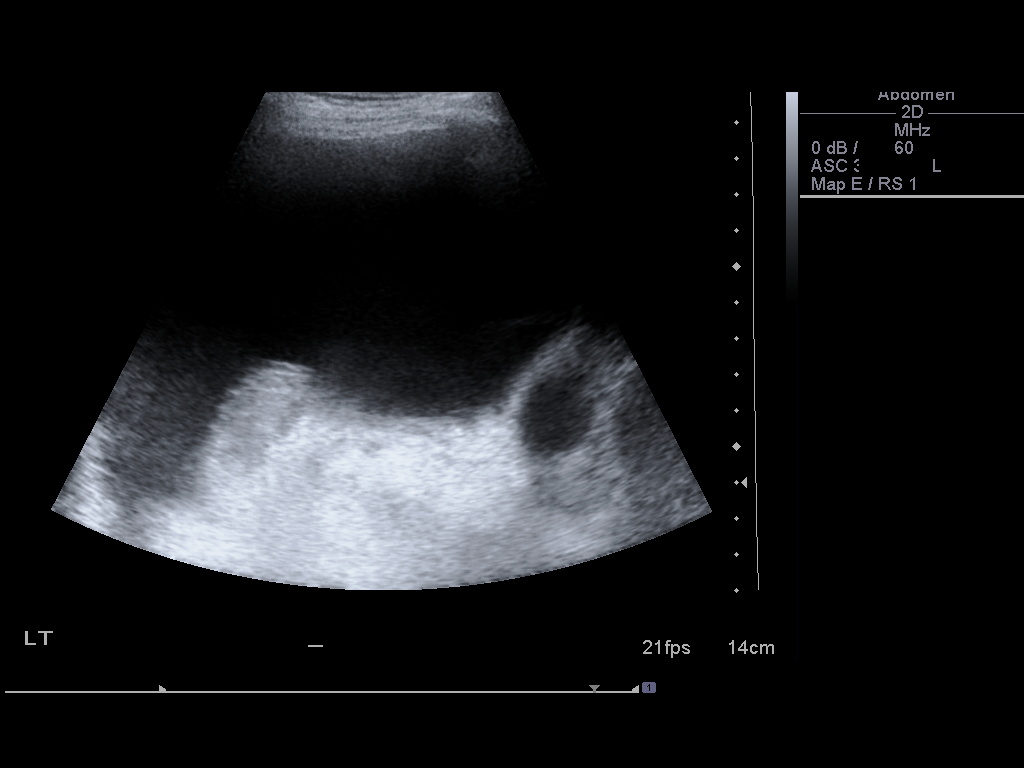

[5 of 5 positions shown; findings below may reference images not displayed]

An ultrasound guided thoracentesis was thoroughly discussed with
the patient and questions answered.  The benefits, risks,
alternatives and complications were also discussed.  The patient
understands and wishes to proceed with the procedure.  Written
consent was obtained.

Ultrasound was performed to localize and mark an adequate pocket of
fluid in the left chest.  The area was then prepped and draped in
the normal sterile fashion.  1% Lidocaine was used for local
anesthesia.  Under ultrasound guidance a 19 gauge Yueh catheter was
introduced.  Thoracentesis was performed.  The catheter was removed
and a dressing applied.

Complications:  None
FINDINGS: A total of approximately 500 ml of blood-tinged fluid was
removed. A fluid sample was sent for laboratory analysis.
IMPRESSION: Successful ultrasound guided  left  thoracentesis yielding 500 ml
of pleural fluid. The patient blood pressure was low with systolic
91 prior to procedure.  Blood pressure dropped to systolic 85 after
500 ml withdrawn.  Thoracentesis was stopped at that point.   Did
have events of systolic as low as 80 and as high as 94.  The
patient  denies pain although did complain of feeling tired.  Back
in room now with 2 liters of oxygen by nasal cannula.

Read by: Jumper, Blain.-SEVDALINA-STANISLAV

## 2012-07-19 IMAGING — CR DG CHEST 2V
2 series · 2 of 2 positions shown · non-contrast
Comparison: Single view of the chest 10/27/2011.

CLINICAL DATA: Status post CABG.  History of pleural effusions.

CHEST - 2 VIEW

[w chest lat]
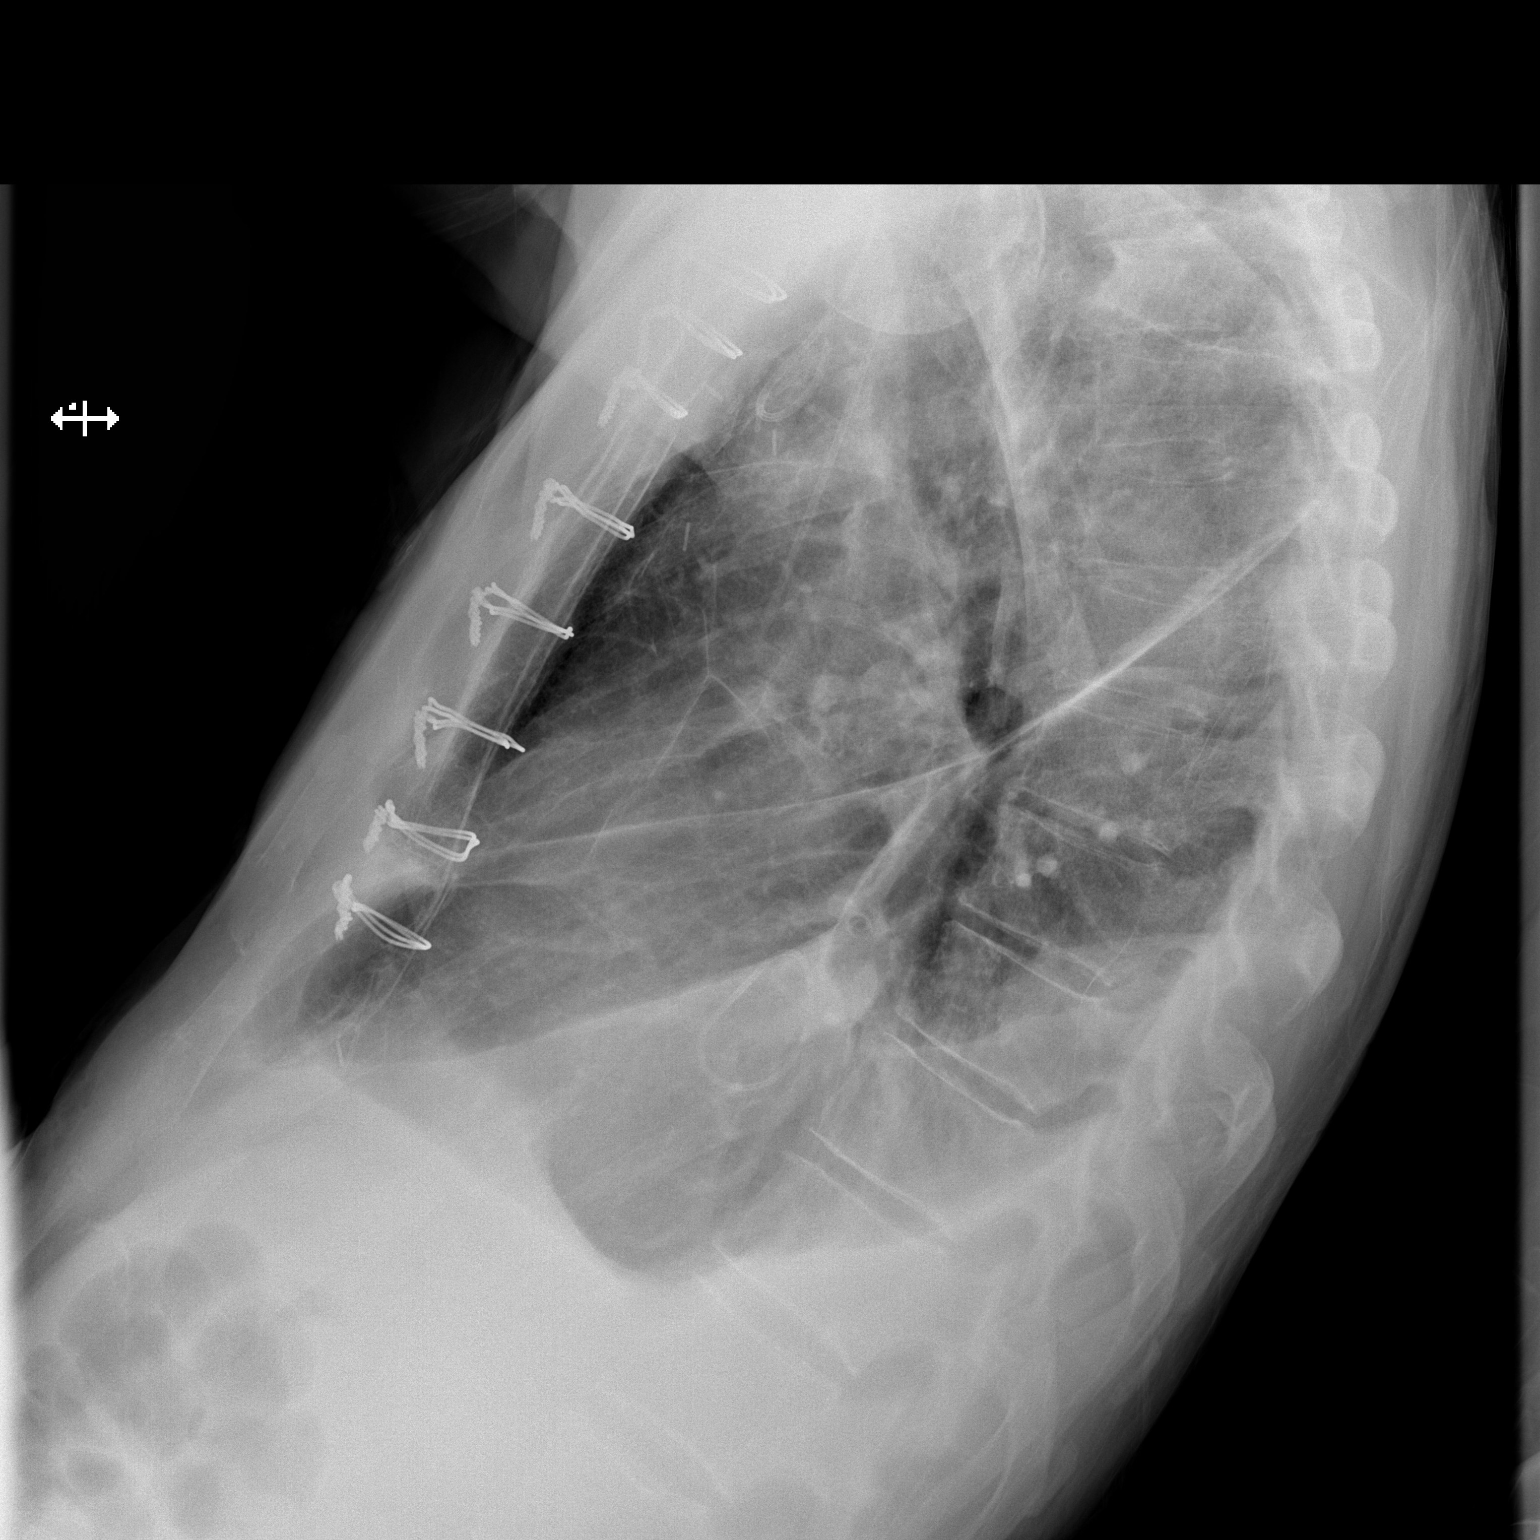

[x chest ap]
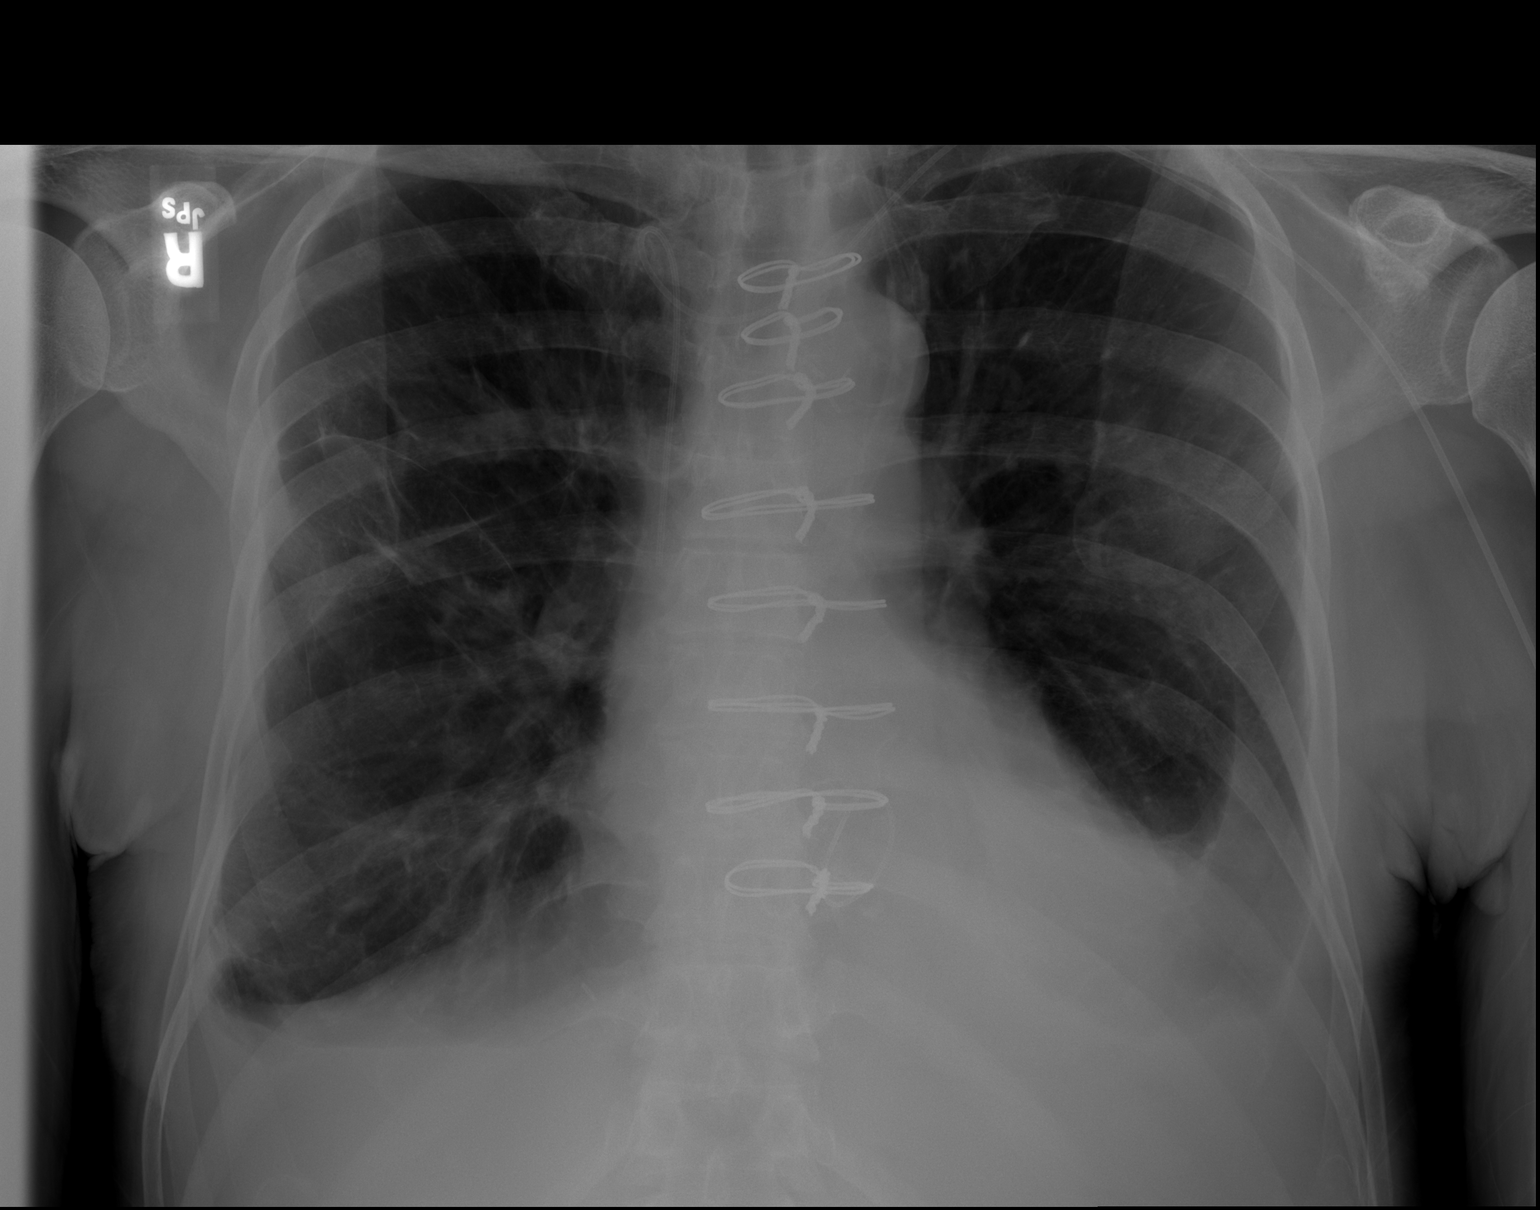

[2 of 2 positions shown; findings below may reference images not displayed]

FINDINGS: Small bilateral pleural effusions, greater on the left,
persist without interval change.  There is associated basilar
atelectasis.  Linear atelectasis or scar in the right upper lobe is
identified.  Heart size is normal.  Left PICC is in place.  There
is a small loop of the catheter in the right brachiocephalic vein
with the tip in the lower superior vena cava.
IMPRESSION: 1.  No notable change in small bilateral pleural effusions, greater
on the left.
2.  Left PICC now has a small loop in the right brachiocephalic
vein.  The tip of the catheter is in the superior vena cava.

## 2012-07-21 ENCOUNTER — Encounter (HOSPITAL_COMMUNITY)
Admission: RE | Admit: 2012-07-21 | Discharge: 2012-07-21 | Disposition: A | Discharge status: Home / Self Care | Payer: Self-pay | Source: Ambulatory Visit | Attending: Cardiology | Admitting: Cardiology

## 2012-07-21 DIAGNOSIS — E7800 Pure hypercholesterolemia, unspecified: Secondary | ICD-10-CM | POA: Insufficient documentation

## 2012-07-21 DIAGNOSIS — I4891 Unspecified atrial fibrillation: Secondary | ICD-10-CM | POA: Insufficient documentation

## 2012-07-21 DIAGNOSIS — I4949 Other premature depolarization: Secondary | ICD-10-CM | POA: Insufficient documentation

## 2012-07-21 DIAGNOSIS — Z5189 Encounter for other specified aftercare: Secondary | ICD-10-CM | POA: Insufficient documentation

## 2012-07-21 DIAGNOSIS — I059 Rheumatic mitral valve disease, unspecified: Secondary | ICD-10-CM | POA: Insufficient documentation

## 2012-07-21 DIAGNOSIS — Z9889 Other specified postprocedural states: Secondary | ICD-10-CM | POA: Insufficient documentation

## 2012-07-21 DIAGNOSIS — R011 Cardiac murmur, unspecified: Secondary | ICD-10-CM | POA: Insufficient documentation

## 2012-07-21 DIAGNOSIS — I10 Essential (primary) hypertension: Secondary | ICD-10-CM | POA: Insufficient documentation

## 2012-07-22 ENCOUNTER — Encounter: Payer: Medicare Other | Admitting: Internal Medicine

## 2012-07-22 IMAGING — CR DG CHEST 2V
2 series · 2 of 2 positions shown · non-contrast
Comparison: 10/28/2011

CLINICAL DATA: Follow-up effusions.  Shortness of breath and
weakness.  History of CABG

CHEST - 2 VIEW

[w chest pa]
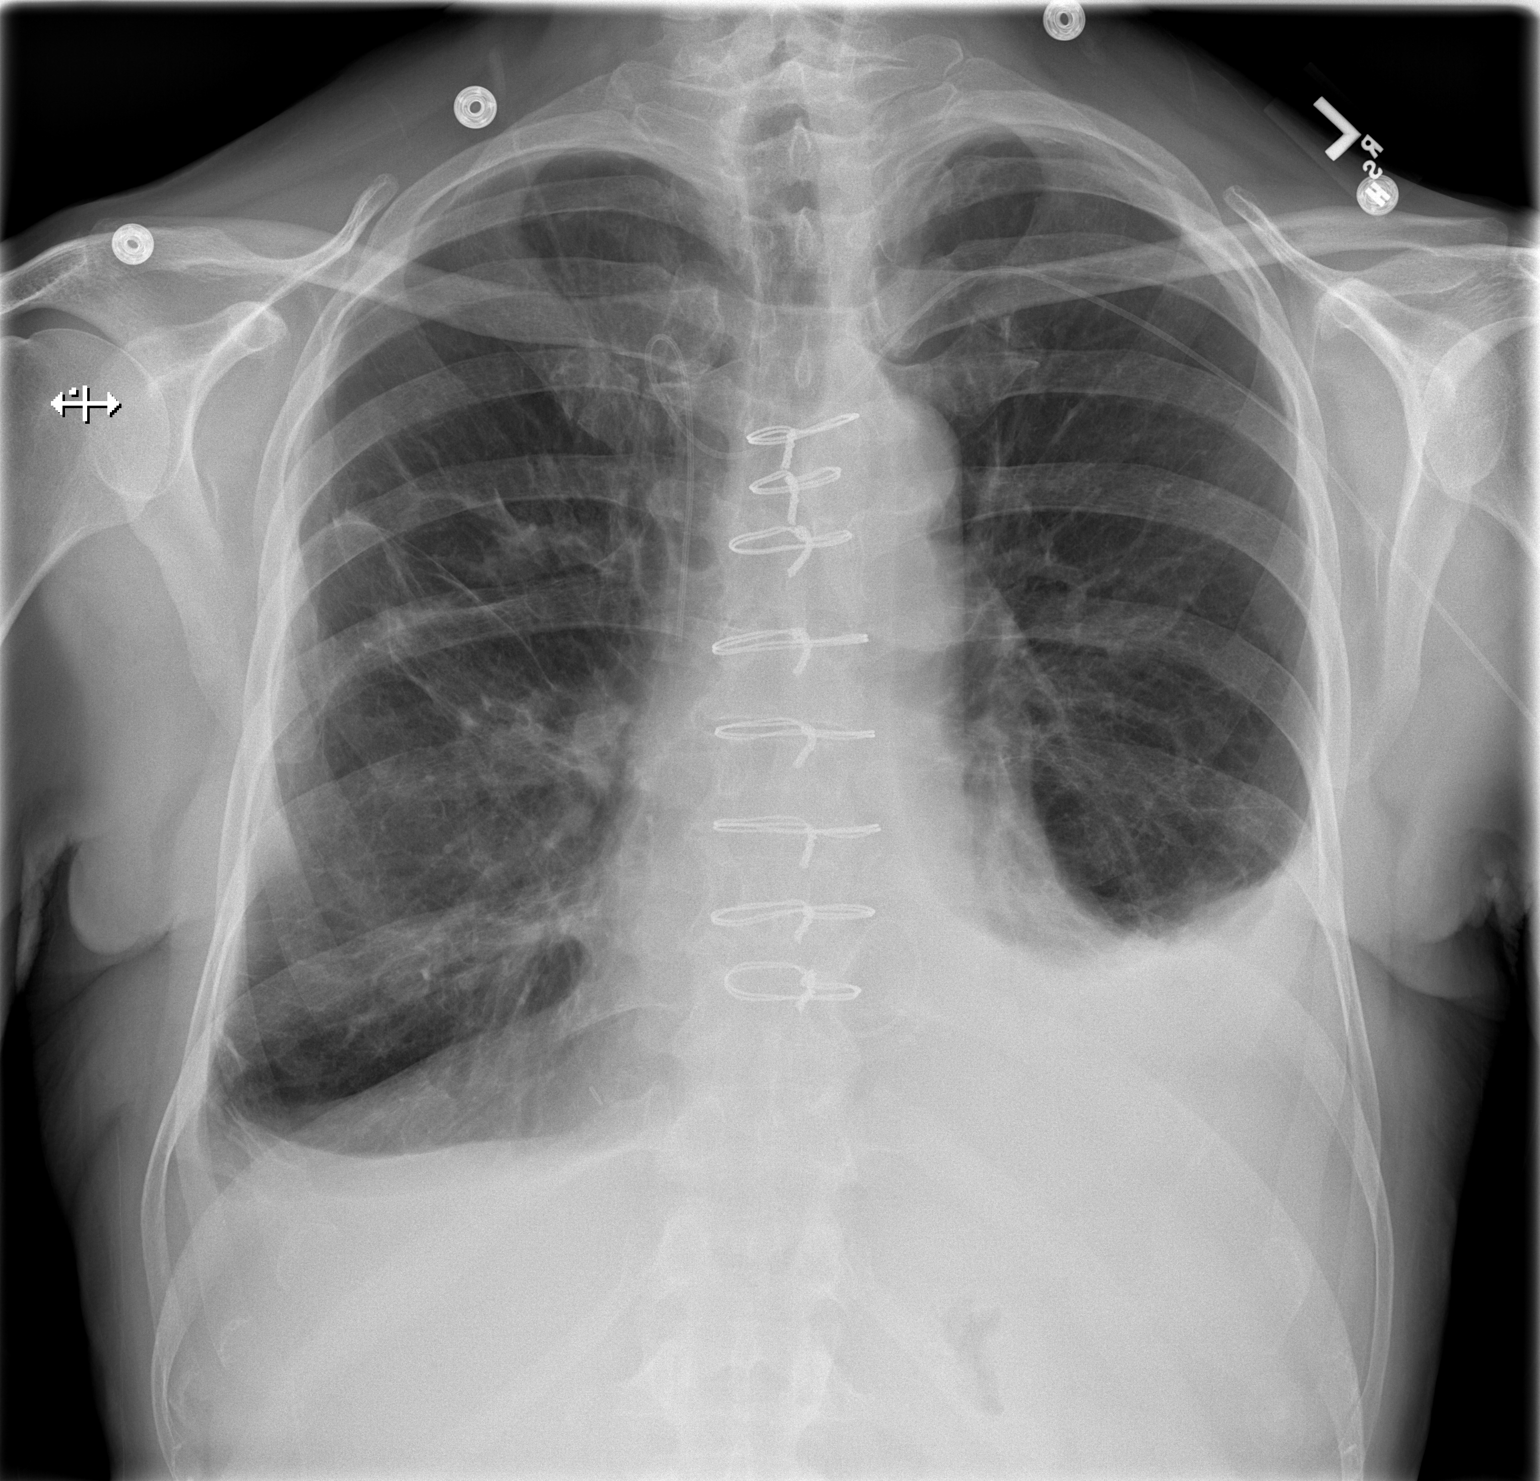

[w chest lat]
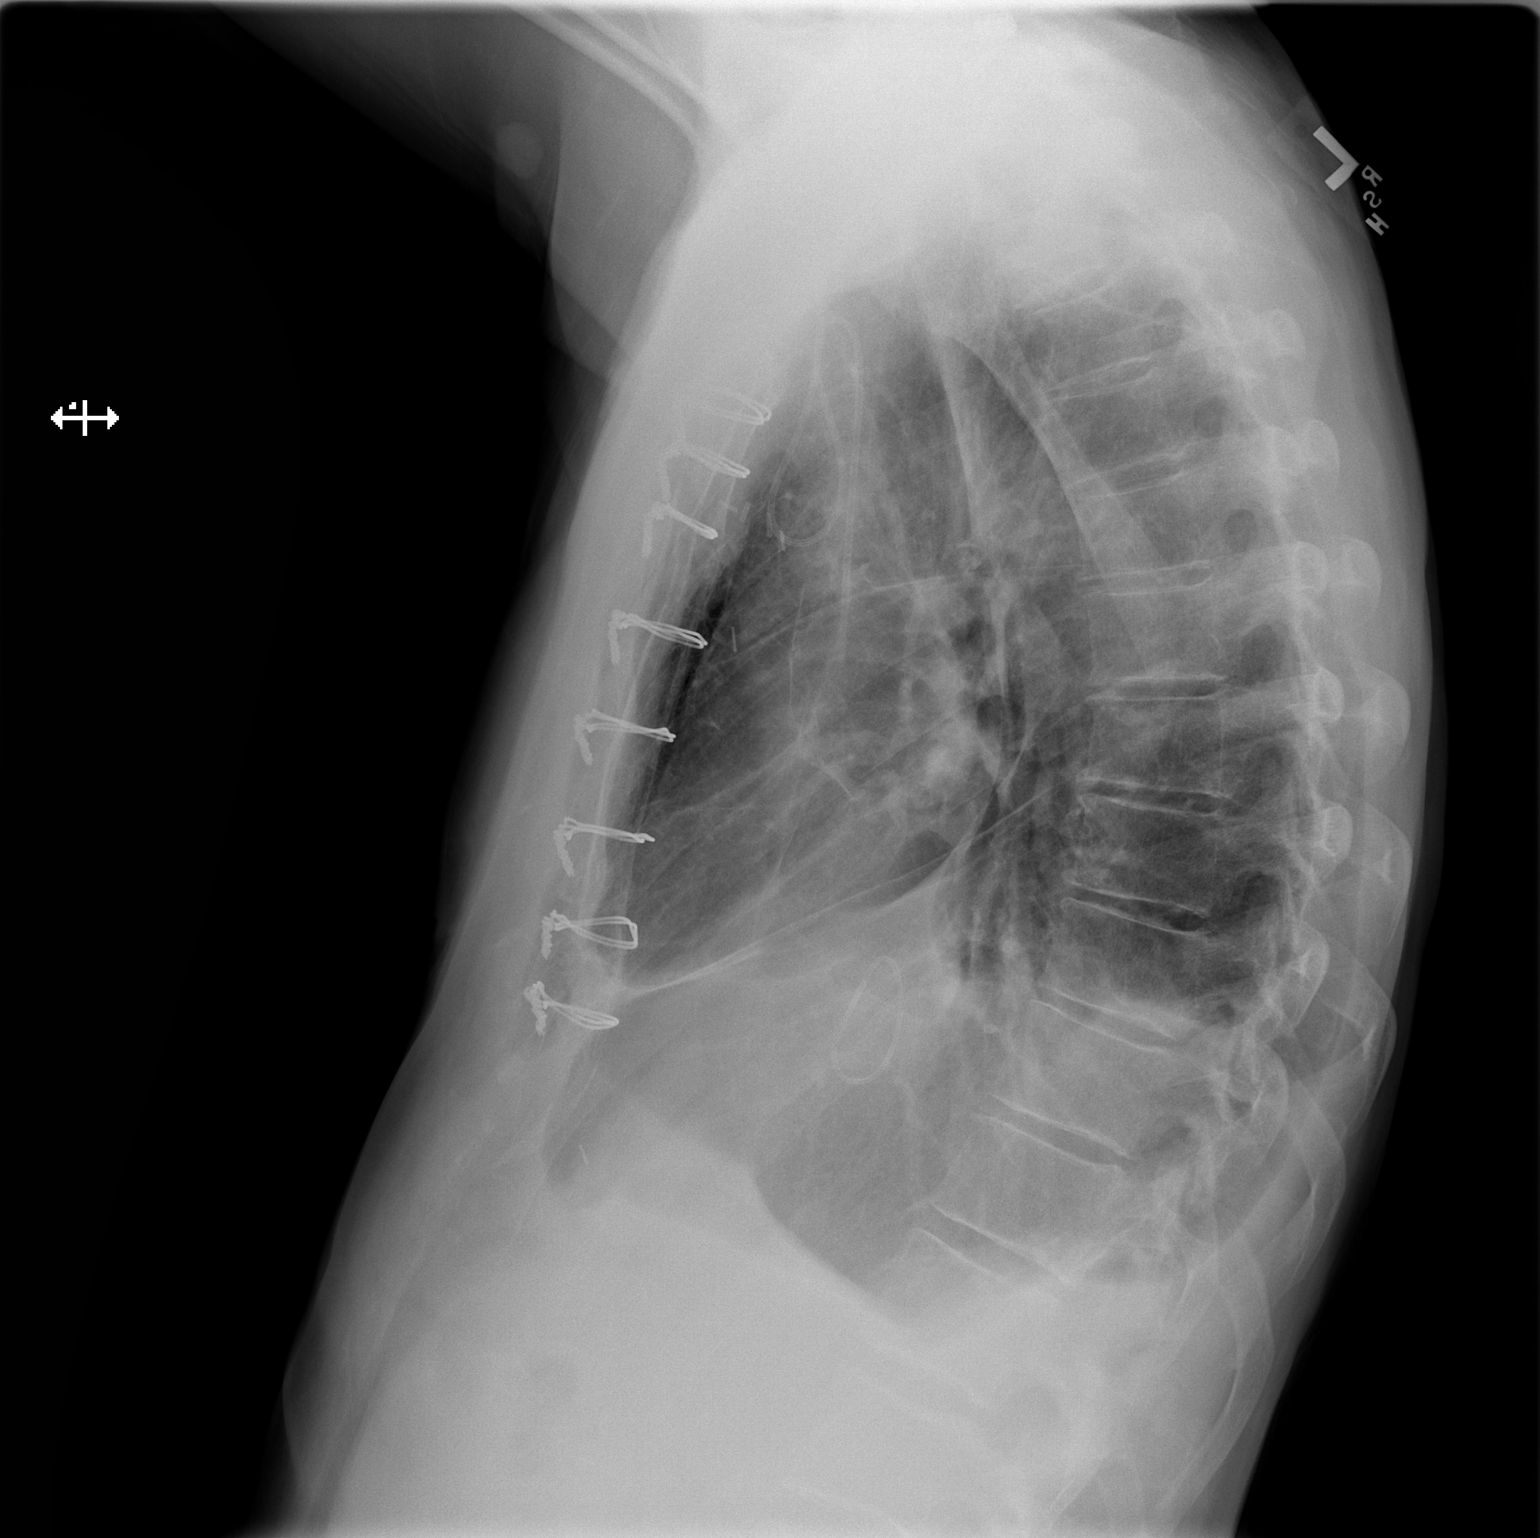

[2 of 2 positions shown; findings below may reference images not displayed]

FINDINGS: The patient is status post median sternotomy.  Left
peripheral central venous catheter remains unchanged in position
with a small loop in the portion positioned within the superior
vena cava.  Heart and mediastinal contours are unchanged.
Bilateral pleural effusions are seen right greater than left with
no significant interval change in size since the previous exam.  A
decrease in the degree of fissural fluid is noted.  Chronic change
with coarse interstitial markings and scarring in the right mid
lung zone is noted.
IMPRESSION: Persistent loop in the peripheral central venous catheter as noted
above.  Unchanged bilateral pleural effusions.  Chronic lung
changes with no new focal infiltrate seen

## 2012-07-23 ENCOUNTER — Encounter (HOSPITAL_COMMUNITY)
Admission: RE | Admit: 2012-07-23 | Discharge: 2012-07-23 | Disposition: A | Discharge status: Home / Self Care | Payer: Self-pay | Source: Ambulatory Visit | Attending: Cardiology | Admitting: Cardiology

## 2012-07-26 ENCOUNTER — Encounter (HOSPITAL_COMMUNITY)
Admission: RE | Admit: 2012-07-26 | Discharge: 2012-07-26 | Disposition: A | Discharge status: Home / Self Care | Payer: Self-pay | Source: Ambulatory Visit | Attending: Cardiology | Admitting: Cardiology

## 2012-07-28 ENCOUNTER — Encounter (HOSPITAL_COMMUNITY)
Admission: RE | Admit: 2012-07-28 | Discharge: 2012-07-28 | Disposition: A | Discharge status: Home / Self Care | Payer: Self-pay | Source: Ambulatory Visit | Attending: Cardiology | Admitting: Cardiology

## 2012-07-30 ENCOUNTER — Encounter (HOSPITAL_COMMUNITY): Payer: Self-pay

## 2012-08-02 ENCOUNTER — Encounter (HOSPITAL_COMMUNITY)
Admission: RE | Admit: 2012-08-02 | Discharge: 2012-08-02 | Disposition: A | Discharge status: Home / Self Care | Payer: Self-pay | Source: Ambulatory Visit | Attending: Cardiology | Admitting: Cardiology

## 2012-08-04 ENCOUNTER — Encounter: Payer: Self-pay | Admitting: Family

## 2012-08-04 ENCOUNTER — Ambulatory Visit (INDEPENDENT_AMBULATORY_CARE_PROVIDER_SITE_OTHER): Payer: Medicare Other | Admitting: Family

## 2012-08-04 ENCOUNTER — Encounter (HOSPITAL_COMMUNITY)
Admission: RE | Admit: 2012-08-04 | Discharge: 2012-08-04 | Disposition: A | Discharge status: Home / Self Care | Payer: Self-pay | Source: Ambulatory Visit | Attending: Cardiology | Admitting: Cardiology

## 2012-08-04 VITALS — BP 130/86 | HR 85 | Temp 98.5°F | Resp 16 | Wt 189.0 lb

## 2012-08-04 DIAGNOSIS — Z23 Encounter for immunization: Secondary | ICD-10-CM

## 2012-08-04 DIAGNOSIS — H6120 Impacted cerumen, unspecified ear: Secondary | ICD-10-CM

## 2012-08-04 NOTE — Progress Notes (Signed)
Subjective:    Patient ID: [name] [name], male    DOB: 10-22-1931, 78 y.o.   MRN: [id number]  HPI  Pt presents with complaint of hearing loss since Monday.  He reports some mild dull pain yesterday.  Denies fever, drainage, cough or cold symptoms. Would like flu shot today.   Review of Systems    see HPI  Past Medical History  Diagnosis Date  . Prostate cancer   . Melanoma     Left Shoulder  . Vision abnormalities     Cornea scarring  . Osteoarthritis     Knees  . Hypertension   . Pure hypercholesterolemia   . Depressive disorder, not elsewhere classified   . MVP (mitral valve prolapse)   . Personal history of colonic polyps   . Valvular heart disease     Last echo in 2010 with EF 55 to 60%, +MVP, mild MR, diastolic dysfunction, biatrial enlargement, mild AI and mild pulmonary HTN  . Normal nuclear stress test 2007  . Thrombocytopenia   . Mitral regurgitation   . MVP (mitral valve prolapse)   . PVC (premature ventricular contraction)   . Shortness of breath   . Blood transfusion   . Neuropathy   . Wears glasses   . Heart murmur     Dr Patty Sermons cardiologist    History   Social History  . Marital Status: Widowed    Spouse Name: N/A    Number of Children: N/A  . Years of Education: N/A   Occupational History  . Not on file.   Social History Main Topics  . Smoking status: Never Smoker   . Smokeless tobacco: Never Used  . Alcohol Use: No  . Drug Use: No  . Sexually Active: Not on file   Other Topics Concern  . 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 Nuclear stress test 09/2006    EF-64%, Normal  . US echocardiography 09/2009, 08/1011    mild LVH,mild AI,MVP with mild MR, mild-mod. TR with mild Pulm. HTN, EF-55-60%  . Inguinal hernia repair 09/2009    Left  . Knee arthroscopy      left x3  and right x2  . Colonoscopy w/ polypectomy   . Prostatectomy 1993  .  Rotator cuff repair 2003    left  . Melanoma surgery     2001, 2005, 2006, 2009  . Tee without cardioversion 09/26/2011    Procedure: TRANSESOPHAGEAL ECHOCARDIOGRAM (TEE);  Surgeon: Lewayne Bunting, MD;  Location: [facility] ENDOSCOPY;  Service: Cardiovascular;  Laterality: N/A;  . Mitral valve repair 10/01/2011    complex valvuloplasty with Goretex cord replacement and chordal transposition 32mm Sorin Memo 3D ring annuloplasty    Family History  Problem Relation Age of Onset  . Clotting disorder Brother     CVA's  . Arthritis Mother   . Hypertension Mother   . Stroke Mother   . Hypertension Father   . Psychosis Father     psychiatric care  . Colon cancer Neg Hx   . Stomach cancer Neg Hx     No Known Allergies  Current Outpatient Prescriptions on File Prior to Visit  Medication Sig Dispense Refill  . acetaminophen (TYLENOL) 325 MG tablet Take 325 mg by mouth every 6 (six) hours as needed. For pain      . ascorbic acid (VITAMIN C) 500 MG tablet Take 500 mg by  mouth 2 (two) times daily.      Marland Kitchen aspirin 81 MG tablet Take 81 mg by mouth daily.      Marland Kitchen atorvastatin (LIPITOR) 10 MG tablet Take 1 tablet (10 mg total) by mouth daily.  90 tablet  3  . celecoxib (CELEBREX) 200 MG capsule Take 200 mg by mouth daily.      . furosemide (LASIX) 40 MG tablet Take 40 mg by mouth daily.       . Glucosamine-Chondroitin (GLUCOSAMINE CHONDR COMPLEX PO) Take by mouth 3 (three) times daily.      Marland Kitchen lactose free nutrition (BOOST) LIQD Take 1 Container by mouth daily.      . metoprolol succinate (TOPROL-XL) 50 MG 24 hr tablet Take 1 tablet (50 mg total) by mouth daily.  90 tablet  3  . Multiple Vitamins-Minerals (MULTIVITAMINS THER. W/MINERALS) TABS Take 1 tablet by mouth daily.  30 each    . Na Sulfate-K Sulfate-Mg Sulf (SUPREP BOWEL PREP) SOLN suprep as directed.  No substitutions  354 mL  0  . potassium chloride SA (K-DUR,KLOR-CON) 20 MEQ tablet Take 1 tablet (20 mEq total) by mouth 3 (three) times daily.   270 tablet  3  . RESTASIS 0.05 % ophthalmic emulsion Place 1 drop into both eyes every 12 (twelve) hours.       . traZODone (DESYREL) 100 MG tablet Take 300 mg by mouth at bedtime.       Marland Kitchen DISCONTD: furosemide (LASIX) 40 MG tablet Take 1 tablet (40 mg total) by mouth 2 (two) times daily.      Marland Kitchen DISCONTD: pantoprazole (PROTONIX) 40 MG tablet Take 1 tablet (40 mg total) by mouth daily before breakfast.      . DISCONTD: potassium chloride SA (K-DUR,KLOR-CON) 20 MEQ tablet Take 2 tablets (40 mEq total) by mouth daily.        BP 130/86  Pulse 85  Temp 98.5 F (36.9 C) (Oral)  Resp 16  Wt 189 lb (85.73 kg)  SpO2 99%    Objective:   Physical Exam  Constitutional: He appears well-developed and well-nourished. No distress.  HENT:  Head: Normocephalic and atraumatic.       Initially both ears were occluded by cerumen.  Ears were irrigated bilaterally by CMA revealing normal TM's.  Cardiovascular: Normal rate and regular rhythm.   No murmur heard. Pulmonary/Chest: Effort normal and breath sounds normal. No respiratory distress. He has no wheezes. He has no rales. He exhibits no tenderness.  Skin: Skin is warm and dry.  Psychiatric: He has a normal mood and affect. His behavior is normal. Judgment and thought content normal.          Assessment & Plan:

## 2012-08-04 NOTE — Assessment & Plan Note (Signed)
Resolved with flushing.   I think this will help his hearing.  I have asked him to let us know if he has any further ear problems.   Flu shot today.

## 2012-08-04 NOTE — Patient Instructions (Addendum)
Call if you have further ear symptoms.

## 2012-08-06 ENCOUNTER — Encounter (HOSPITAL_COMMUNITY)
Admission: RE | Admit: 2012-08-06 | Discharge: 2012-08-06 | Disposition: A | Discharge status: Home / Self Care | Payer: Self-pay | Source: Ambulatory Visit | Attending: Cardiology | Admitting: Cardiology

## 2012-08-09 ENCOUNTER — Encounter (HOSPITAL_COMMUNITY)
Admission: RE | Admit: 2012-08-09 | Discharge: 2012-08-09 | Disposition: A | Discharge status: Home / Self Care | Payer: Self-pay | Source: Ambulatory Visit | Attending: Cardiology | Admitting: Cardiology

## 2012-08-09 ENCOUNTER — Encounter: Payer: Self-pay | Admitting: Internal Medicine

## 2012-08-10 ENCOUNTER — Encounter: Payer: Self-pay | Admitting: Internal Medicine

## 2012-08-10 ENCOUNTER — Ambulatory Visit (INDEPENDENT_AMBULATORY_CARE_PROVIDER_SITE_OTHER): Payer: Medicare Other | Admitting: Internal Medicine

## 2012-08-10 VITALS — BP 142/60 | HR 72 | Ht 76.5 in | Wt 189.4 lb

## 2012-08-10 DIAGNOSIS — Z1211 Encounter for screening for malignant neoplasm of colon: Secondary | ICD-10-CM

## 2012-08-10 DIAGNOSIS — Z86010 Personal history of colonic polyps: Secondary | ICD-10-CM

## 2012-08-10 NOTE — Patient Instructions (Addendum)
You have been scheduled for a colonoscopy with propofol. Please follow written instructions given to you at your visit today.   If you use inhalers (even only as needed), please bring them with you on the day of your procedure  Thank you for choosing me and [facility].  Iva Boop, M.D., [facility]

## 2012-08-10 NOTE — Progress Notes (Signed)
Patient ID: [name] [name], male   DOB: [birth year], 78 y.o.   MRN: [id number]   The patient was screened to an office visit due to age and co-morbidities - he is due for colon cancer screening and polyp surveillance. He feels well and is recovered from coronary surgery, is actively participating in a maintenance cardiac rehab without problems.  We have decided to proceed with screening colonoscopy.

## 2012-08-11 ENCOUNTER — Encounter (HOSPITAL_COMMUNITY)
Admission: RE | Admit: 2012-08-11 | Discharge: 2012-08-11 | Disposition: A | Discharge status: Home / Self Care | Payer: Self-pay | Source: Ambulatory Visit | Attending: Cardiology | Admitting: Cardiology

## 2012-08-13 ENCOUNTER — Encounter (HOSPITAL_COMMUNITY): Payer: Self-pay

## 2012-08-16 ENCOUNTER — Encounter (HOSPITAL_COMMUNITY): Payer: Self-pay

## 2012-08-18 ENCOUNTER — Encounter (HOSPITAL_COMMUNITY): Payer: Medicare Other

## 2012-08-19 HISTORY — PX: ROOT CANAL: SHX2363

## 2012-08-20 ENCOUNTER — Encounter (HOSPITAL_COMMUNITY): Payer: Medicare Other

## 2012-08-20 ENCOUNTER — Ambulatory Visit: Payer: Medicare Other | Admitting: Internal Medicine

## 2012-08-23 ENCOUNTER — Encounter (HOSPITAL_COMMUNITY): Payer: Medicare Other

## 2012-08-25 ENCOUNTER — Ambulatory Visit (AMBULATORY_SURGERY_CENTER): Payer: Medicare Other | Admitting: Internal Medicine

## 2012-08-25 ENCOUNTER — Encounter (HOSPITAL_COMMUNITY): Payer: Medicare Other

## 2012-08-25 ENCOUNTER — Encounter: Payer: Self-pay | Admitting: Internal Medicine

## 2012-08-25 VITALS — BP 126/57 | HR 71 | Temp 97.0°F | Resp 60 | Ht 76.5 in | Wt 189.0 lb

## 2012-08-25 DIAGNOSIS — K635 Polyp of colon: Secondary | ICD-10-CM

## 2012-08-25 DIAGNOSIS — D126 Benign neoplasm of colon, unspecified: Secondary | ICD-10-CM

## 2012-08-25 DIAGNOSIS — K5730 Diverticulosis of large intestine without perforation or abscess without bleeding: Secondary | ICD-10-CM

## 2012-08-25 DIAGNOSIS — Z1211 Encounter for screening for malignant neoplasm of colon: Secondary | ICD-10-CM

## 2012-08-25 DIAGNOSIS — Z86010 Personal history of colonic polyps: Secondary | ICD-10-CM

## 2012-08-25 MED ORDER — SODIUM CHLORIDE 0.9 % IV SOLN: 500.0000 mL | INTRAVENOUS | Status: DC

## 2012-08-25 NOTE — Progress Notes (Signed)
Patient did not experience any of the following events: a burn prior to discharge; a fall within the facility; wrong site/side/patient/procedure/implant event; or a hospital transfer or hospital admission upon discharge from the facility. (G8907) Patient did not have preoperative order for IV antibiotic SSI prophylaxis. (G8918)  

## 2012-08-25 NOTE — Op Note (Signed)
[facility] [address] [place], [address]   COLONOSCOPY PROCEDURE REPORT  PATIENT: [name] [name], [name] [name]  MR#: [id number] BIRTHDATE: 1931/07/14 , 81  yrs. old GENDER: Male ENDOSCOPIST: Iva Boop, MD, [facility] REFERRED BY: PROCEDURE DATE:  08/25/2012 PROCEDURE:   Colonoscopy with biopsy and Colonoscopy with snare polypectomy ASA CLASS:   Class III INDICATIONS:screening and surveillance,personal history of colonic polyps.   adenomas dating to 1983, 2007 no polyps MEDICATIONS: Propofol (Diprivan) 210 mg IV, MAC sedation, administered by CRNA, and These medications were titrated to patient response per physician's verbal order  DESCRIPTION OF PROCEDURE:   After the risks benefits and alternatives of the procedure were thoroughly explained, informed consent was obtained.  A digital rectal exam revealed no abnormalities of the rectum.   The LB [id number]  endoscope was introduced through the anus and advanced to the cecum, which was identified by both the appendix and ileocecal valve. No adverse events experienced.   The quality of the prep was Suprep good  The instrument was then slowly withdrawn as the colon was fully examined.      COLON FINDINGS: Five polypoid shaped sessile polyps ranging between 3-82mm in size were found at the cecum, splenic flexure, and in the descending colon.  A polypectomy was performed with cold forceps and with a cold snare.  The resection was complete and the polyp tissue was partially retrieved.   There was severe diverticulosis noted in the left colon with associated luminal narrowing and muscular hypertrophy.   The colon mucosa was otherwise normal. Small internal hemorrhoids were found.  Retroflexed views revealed internal hemorrhoids. The time to cecum=3 minutes 32 seconds. Withdrawal time=14 minutes 04 seconds.  The scope was withdrawn and the procedure completed. COMPLICATIONS: There were no  complications.  ENDOSCOPIC IMPRESSION: 1.   Five sessile polyps ranging between 3-70mm in size were found at the cecum, splenic flexure, and in the descending colon; polypectomy was performed with cold forceps and with a cold snare 2.   There was severe diverticulosis noted in the left colon 3.   The colon mucosa was otherwise normal 4.   Small internal hemorrhoids 5.   Personal hx adenomatous polyps  RECOMMENDATIONS: await pathology results eSigned:  Iva Boop, MD, [facility] 08/25/2012 12:27 PM  cc: The Patient

## 2012-08-25 NOTE — Patient Instructions (Addendum)
Five small polyps were removed. One was not picked up but the others were and will be analyzed.  You also have severe diverticulosis.  Thank you for choosing me and [facility].  Iva Boop, MD, [facility]  Handouts given today on polyps,diverticulosis and hemorrhoids. Call us with any questions or concerns. Thank you!!  YOU HAD AN ENDOSCOPIC PROCEDURE TODAY AT THE [facility]: Refer to the procedure report that was given to you for any specific questions about what was found during the examination.  If the procedure report does not answer your questions, please call your gastroenterologist to clarify.  If you requested that your care partner not be given the details of your procedure findings, then the procedure report has been included in a sealed envelope for you to review at your convenience later.  YOU SHOULD EXPECT: Some feelings of bloating in the abdomen. Passage of more gas than usual.  Walking can help get rid of the air that was put into your GI tract during the procedure and reduce the bloating. If you had a lower endoscopy (such as a colonoscopy or flexible sigmoidoscopy) you may notice spotting of blood in your stool or on the toilet paper. If you underwent a bowel prep for your procedure, then you may not have a normal bowel movement for a few days.  DIET: Your first meal following the procedure should be a light meal and then it is ok to progress to your normal diet.  A half-sandwich or bowl of soup is an example of a good first meal.  Heavy or fried foods are harder to digest and may make you feel nauseous or bloated.  Likewise meals heavy in dairy and vegetables can cause extra gas to form and this can also increase the bloating.  Drink plenty of fluids but you should avoid alcoholic beverages for 24 hours.  ACTIVITY: Your care partner should take you home directly after the procedure.  You should plan to take it easy, moving slowly for the rest of the day.   You can resume normal activity the day after the procedure however you should NOT DRIVE or use heavy machinery for 24 hours (because of the sedation medicines used during the test).    SYMPTOMS TO REPORT IMMEDIATELY: A gastroenterologist can be reached at any hour.  During normal business hours, 8:30 AM to 5:00 PM Monday through Friday, call [phone].  After hours and on weekends, please call the GI answering service at [phone] who will take a message and have the physician on call contact you.   Following lower endoscopy (colonoscopy or flexible sigmoidoscopy):  Excessive amounts of blood in the stool  Significant tenderness or worsening of abdominal pains  Swelling of the abdomen that is new, acute  Fever of 100F or higher  FOLLOW UP: If any biopsies were taken you will be contacted by phone or by letter within the next 1-3 weeks.  Call your gastroenterologist if you have not heard about the biopsies in 3 weeks.  Our staff will call the home number listed on your records the next business day following your procedure to check on you and address any questions or concerns that you may have at that time regarding the information given to you following your procedure. This is a courtesy call and so if there is no answer at the home number and we have not heard from you through the emergency physician on call, we will assume that you have returned  to your regular daily activities without incident.  SIGNATURES/CONFIDENTIALITY: You and/or your care partner have signed paperwork which will be entered into your electronic medical record.  These signatures attest to the fact that that the information above on your After Visit Summary has been reviewed and is understood.  Full responsibility of the confidentiality of this discharge information lies with you and/or your care-partner.  

## 2012-08-26 ENCOUNTER — Telehealth: Payer: Self-pay | Admitting: *Deleted

## 2012-08-26 NOTE — Telephone Encounter (Signed)
  Follow up Call-  Call back number 08/25/2012  Post procedure Call Back phone  # [phone]  Permission to leave phone message Yes     No Answer unable to leave message.

## 2012-08-27 ENCOUNTER — Encounter (HOSPITAL_COMMUNITY): Payer: Medicare Other

## 2012-08-30 ENCOUNTER — Encounter: Payer: Self-pay | Admitting: Internal Medicine

## 2012-08-30 ENCOUNTER — Encounter (HOSPITAL_COMMUNITY): Payer: Medicare Other

## 2012-09-01 ENCOUNTER — Encounter (HOSPITAL_COMMUNITY): Payer: Medicare Other

## 2012-09-03 ENCOUNTER — Encounter (HOSPITAL_COMMUNITY): Payer: Medicare Other

## 2012-09-06 ENCOUNTER — Encounter (HOSPITAL_COMMUNITY): Payer: Medicare Other

## 2012-09-07 ENCOUNTER — Other Ambulatory Visit: Payer: Self-pay | Admitting: Dermatology

## 2012-09-08 ENCOUNTER — Encounter (HOSPITAL_COMMUNITY): Payer: Medicare Other

## 2012-09-10 ENCOUNTER — Encounter (HOSPITAL_COMMUNITY): Payer: Medicare Other

## 2012-09-13 ENCOUNTER — Encounter (HOSPITAL_COMMUNITY): Payer: Medicare Other

## 2012-09-15 ENCOUNTER — Encounter (HOSPITAL_COMMUNITY): Payer: Medicare Other

## 2012-09-17 ENCOUNTER — Encounter (HOSPITAL_COMMUNITY): Payer: Medicare Other

## 2012-09-20 ENCOUNTER — Encounter (HOSPITAL_COMMUNITY): Payer: Medicare Other

## 2012-09-22 ENCOUNTER — Encounter (HOSPITAL_COMMUNITY): Payer: Medicare Other

## 2012-09-22 ENCOUNTER — Other Ambulatory Visit: Payer: Self-pay | Admitting: Dermatology

## 2012-09-24 ENCOUNTER — Encounter (HOSPITAL_COMMUNITY): Payer: Medicare Other

## 2012-09-27 ENCOUNTER — Encounter (HOSPITAL_COMMUNITY): Payer: Medicare Other

## 2012-09-29 ENCOUNTER — Encounter (HOSPITAL_COMMUNITY): Payer: Medicare Other

## 2012-09-30 ENCOUNTER — Encounter: Payer: Self-pay | Admitting: Cardiology

## 2012-09-30 ENCOUNTER — Other Ambulatory Visit (INDEPENDENT_AMBULATORY_CARE_PROVIDER_SITE_OTHER): Payer: Medicare Other

## 2012-09-30 ENCOUNTER — Ambulatory Visit (INDEPENDENT_AMBULATORY_CARE_PROVIDER_SITE_OTHER): Payer: Medicare Other | Admitting: Cardiology

## 2012-09-30 VITALS — BP 122/66 | HR 75 | Resp 18 | Ht 77.0 in | Wt 183.4 lb

## 2012-09-30 DIAGNOSIS — E7800 Pure hypercholesterolemia, unspecified: Secondary | ICD-10-CM

## 2012-09-30 DIAGNOSIS — I491 Atrial premature depolarization: Secondary | ICD-10-CM

## 2012-09-30 DIAGNOSIS — Z9889 Other specified postprocedural states: Secondary | ICD-10-CM

## 2012-09-30 DIAGNOSIS — C439 Malignant melanoma of skin, unspecified: Secondary | ICD-10-CM | POA: Insufficient documentation

## 2012-09-30 DIAGNOSIS — I4891 Unspecified atrial fibrillation: Secondary | ICD-10-CM

## 2012-09-30 LAB — BASIC METABOLIC PANEL
BUN: 26 mg/dL — ABNORMAL HIGH (ref 6–23)
Chloride: 101 mEq/L (ref 96–112)
Creatinine, Ser: 1 mg/dL (ref 0.4–1.5)
GFR: 76.51 mL/min (ref >60.00)
Potassium: 4.6 mEq/L (ref 3.5–5.1)

## 2012-09-30 LAB — LIPID PANEL
Cholesterol: 147 mg/dL (ref 0–200)
LDL Cholesterol: 85 mg/dL (ref 0–99)
Total CHOL/HDL Ratio: 3
VLDL: 14.8 mg/dL (ref 0.0–40.0)

## 2012-09-30 LAB — HEPATIC FUNCTION PANEL
ALT: 19 U/L (ref 0–53)
AST: 26 U/L (ref 0–37)
Bilirubin, Direct: 0.2 mg/dL (ref 0.0–0.3)
Total Bilirubin: 1.5 mg/dL — ABNORMAL HIGH (ref 0.3–1.2)

## 2012-09-30 NOTE — Assessment & Plan Note (Signed)
Today is the one year anniversary of his mitral valve repair.  He is very pleased with the results.  He is not having any symptoms of congestive heart failure and his exam does not reveal any residual murmur of mitral regurgitation.

## 2012-09-30 NOTE — Progress Notes (Signed)
Quick Note:  Please report to patient. The recent labs are stable. Continue same medication and careful diet. Kidneys dry. Drink more water. ______ 

## 2012-09-30 NOTE — Progress Notes (Signed)
[name] [name] Date of Birth:  [birth year] [facility] [address] [place], [place]  [address] [phone]         Fax   [phone]  History of Present Illness: This pleasant 78 year old gentleman is seen for a scheduled followup office visit. He has a past history of severe mitral valve prolapse. He underwent mitral valve repair by Dr. Cornelius Moras on 10/01/11. He did not have any coronary disease. He had a prolonged hospital course but eventually recovered and is now finished the cardiac rehabilitation program and is now exercising at the [facility] A previous decubitus ulcer has completely healed up. He remains in normal sinus rhythm after being in atrial fibrillation postoperatively. He is no longer on warfarin but does take baby aspirin daily. He notes occasional skipped beats usually when it is time for him to take his nighttime dose of Lopressor.  This has improved since we switched him to Toprol.   Current Outpatient Prescriptions  Medication Sig Dispense Refill  . acetaminophen (TYLENOL) 325 MG tablet Take 325 mg by mouth every 6 (six) hours as needed. For pain      . ascorbic acid (VITAMIN C) 500 MG tablet Take 500 mg by mouth 2 (two) times daily.      Marland Kitchen aspirin 81 MG tablet Take 81 mg by mouth daily.      Marland Kitchen atorvastatin (LIPITOR) 10 MG tablet Take 1 tablet (10 mg total) by mouth daily.  90 tablet  3  . celecoxib (CELEBREX) 200 MG capsule Take 200 mg by mouth daily.      . furosemide (LASIX) 40 MG tablet Take 40 mg by mouth daily.       . Glucosamine-Chondroitin (GLUCOSAMINE CHONDR COMPLEX PO) Take by mouth 3 (three) times daily.      Marland Kitchen lactose free nutrition (BOOST) LIQD Take 1 Container by mouth daily.      . metoprolol succinate (TOPROL-XL) 50 MG 24 hr tablet Take 1 tablet (50 mg total) by mouth daily.  90 tablet  3  . Multiple Vitamins-Minerals (MULTIVITAMINS THER. W/MINERALS) TABS Take 1 tablet by mouth daily.  30 each    . potassium chloride SA  (K-DUR,KLOR-CON) 20 MEQ tablet Take 1 tablet (20 mEq total) by mouth 3 (three) times daily.  270 tablet  3  . RESTASIS 0.05 % ophthalmic emulsion Place 1 drop into both eyes every 12 (twelve) hours.       . traZODone (DESYREL) 100 MG tablet Take 300 mg by mouth at bedtime.       Marland Kitchen doxycycline (VIBRA-TABS) 100 MG tablet Take 1 tablet by mouth Twice daily.      . [DISCONTINUED] furosemide (LASIX) 40 MG tablet Take 1 tablet (40 mg total) by mouth 2 (two) times daily.      . [DISCONTINUED] pantoprazole (PROTONIX) 40 MG tablet Take 1 tablet (40 mg total) by mouth daily before breakfast.      . [DISCONTINUED] potassium chloride SA (K-DUR,KLOR-CON) 20 MEQ tablet Take 2 tablets (40 mEq total) by mouth daily.        No Known Allergies  Patient Active Problem List  Diagnosis  . TINEA PEDIS  . ADENOCARCINOMA, PROSTATE  . HYPERCHOLESTEROLEMIA  . THROMBOCYTOPENIA  . DEPRESSION  . HYPERTENSION, BENIGN ESSENTIAL  . GERD  . DERMATITIS, ATOPIC  . CALLUS, LEFT FOOT  . OSTEOARTHRITIS, GENERALIZED, MULTIPLE JOINTS  . BACK PAIN, CHRONIC  . MUSCLE SPASM, BACK  . PERSONAL HISTORY MALIGNANT NEOPLASM PROSTATE  . PERSONAL HISTORY OF  MALIGNANT MELANOMA OF SKIN  . ARRHYTHMIA, HX OF  . Personal history of colonic polyps  . Cerumen impaction  . Hearing loss  . Valvular heart disease  . S/P mitral valve repair  . Atrial fibrillation  . Decubitus skin ulcer  . Pleural effusion due to congestive heart failure  . Encounter for long-term (current) use of anticoagulants  . Hx of mitral valve repair  . Cough    History  Smoking status  . Never Smoker   Smokeless tobacco  . Never Used    History  Alcohol Use No    Family History  Problem Relation Age of Onset  . Clotting disorder Brother     CVA's  . Arthritis Mother   . Hypertension Mother   . Stroke Mother   . Hypertension Father   . Psychosis Father     psychiatric care  . Colon cancer Neg Hx   . Stomach cancer Neg Hx     Review of  Systems: Constitutional: no fever chills diaphoresis or fatigue or change in weight.  Head and neck: no hearing loss, no epistaxis, no photophobia or visual disturbance. Respiratory: No cough, shortness of breath or wheezing. Cardiovascular: No chest pain peripheral edema, palpitations. Gastrointestinal: No abdominal distention, no abdominal pain, no change in bowel habits hematochezia or melena. Genitourinary: No dysuria, no frequency, no urgency, no nocturia. Musculoskeletal:No arthralgias, no back pain, no gait disturbance or myalgias. Neurological: No dizziness, no headaches, no numbness, no seizures, no syncope, no weakness, no tremors. Hematologic: No lymphadenopathy, no easy bruising. Psychiatric: No confusion, no hallucinations, no sleep disturbance.    Physical Exam: Filed Vitals:   09/30/12 1025  BP: 122/66  Pulse: 75  Resp: 18   the general appearance reveals a tall elderly gentleman in no distress.The head and neck exam reveals pupils equal and reactive.  Extraocular movements are full.  There is no scleral icterus.  The mouth and pharynx are normal.  The neck is supple.  The carotids reveal no bruits.  The jugular venous pressure is normal.  The  thyroid is not enlarged.  There is no lymphadenopathy.  The chest is clear to percussion and auscultation.  There are no rales or rhonchi.  Expansion of the chest is symmetrical.  The precordium is quiet.  The first heart sound is normal.  The second heart sound is physiologically split.  There is no murmur gallop rub or click.  There is no abnormal lift or heave.  The abdomen is soft and nontender.  The bowel sounds are normal.  The liver and spleen are not enlarged.  There are no abdominal masses.  There are no abdominal bruits.  Extremities reveal good pedal pulses.  There is no phlebitis or edema.  There is no cyanosis or clubbing.  Strength is normal and symmetrical in all extremities.  There is no lateralizing weakness.  There are no  sensory deficits.  The skin is warm and dry.  There is no rash.  There is a healing lesion over the left supraorbital scapular area where the melanoma was removed.  EKG shows sinus rhythm with first degree AV block and PACs  Assessment / Plan:  The patient is doing very well.  Continue same medication.  Recheck in 4 months for office visit EKG lipid panel hepatic function panel and basal metabolic panel.

## 2012-09-30 NOTE — Patient Instructions (Addendum)
Will obtain labs today and call you with the results (lp/bmet/hfp)  Your physician recommends that you continue on your current medications as directed. Please refer to the Current Medication list given to you today.  Your physician recommends that you schedule a follow-up appointment in: 4 months with fasting labs (lp/bmet/hfp) and ekg  

## 2012-09-30 NOTE — Assessment & Plan Note (Signed)
The patient has a history of hypercholesterolemia.  He is on Lipitor 10 mg daily.  Blood work today is pending.  He is not having any side effects from the Lipitor

## 2012-09-30 NOTE — Assessment & Plan Note (Signed)
Since last saw him the patient had another melanoma lesion removed from his left suprascapular area by his dermatologist Dr. Karlyn Agee.  This is his fourth melanoma.

## 2012-09-30 NOTE — Assessment & Plan Note (Signed)
The patient has had no recurrence of his atrial fibrillation.  EKG today shows normal sinus rhythm with premature atrial complexes

## 2012-10-01 ENCOUNTER — Encounter (HOSPITAL_COMMUNITY): Payer: Medicare Other

## 2012-10-01 ENCOUNTER — Telehealth: Payer: Self-pay | Admitting: Cardiology

## 2012-10-01 NOTE — Telephone Encounter (Signed)
Message copied by Burnell Blanks on Fri Oct 01, 2012  3:49 PM ------      Message from: Cassell Clement      Created: Thu Sep 30, 2012  8:53 PM       Please report to patient.  The recent labs are stable. Continue same medication and careful diet. Kidneys dry. Drink more water.

## 2012-10-01 NOTE — Telephone Encounter (Signed)
F/u  Returning call back to nurse.  

## 2012-10-01 NOTE — Telephone Encounter (Signed)
Advised patient of lab results  

## 2012-10-04 ENCOUNTER — Encounter (HOSPITAL_COMMUNITY): Payer: Medicare Other

## 2012-10-06 ENCOUNTER — Encounter (HOSPITAL_COMMUNITY): Payer: Medicare Other

## 2012-10-08 ENCOUNTER — Encounter (HOSPITAL_COMMUNITY): Payer: Medicare Other

## 2012-10-11 ENCOUNTER — Encounter (HOSPITAL_COMMUNITY): Payer: Medicare Other

## 2012-10-13 ENCOUNTER — Encounter (HOSPITAL_COMMUNITY): Payer: Medicare Other

## 2012-10-18 ENCOUNTER — Encounter (HOSPITAL_COMMUNITY): Payer: Medicare Other

## 2012-10-20 ENCOUNTER — Encounter (HOSPITAL_COMMUNITY): Payer: Medicare Other

## 2012-10-22 ENCOUNTER — Encounter (HOSPITAL_COMMUNITY): Payer: Medicare Other

## 2012-10-25 ENCOUNTER — Encounter (HOSPITAL_COMMUNITY): Payer: Medicare Other

## 2012-10-27 ENCOUNTER — Encounter (HOSPITAL_COMMUNITY): Payer: Medicare Other

## 2012-10-29 ENCOUNTER — Encounter (HOSPITAL_COMMUNITY): Payer: Medicare Other

## 2012-11-01 ENCOUNTER — Encounter (HOSPITAL_COMMUNITY): Payer: Medicare Other

## 2012-11-02 ENCOUNTER — Other Ambulatory Visit: Payer: Self-pay | Admitting: Dermatology

## 2012-11-03 ENCOUNTER — Encounter (HOSPITAL_COMMUNITY): Payer: Medicare Other

## 2012-11-05 ENCOUNTER — Encounter (HOSPITAL_COMMUNITY): Payer: Medicare Other

## 2012-11-08 ENCOUNTER — Encounter (HOSPITAL_COMMUNITY): Payer: Medicare Other

## 2012-11-12 ENCOUNTER — Encounter (HOSPITAL_COMMUNITY): Payer: Medicare Other

## 2012-11-15 ENCOUNTER — Encounter (HOSPITAL_COMMUNITY): Payer: Medicare Other

## 2012-11-19 ENCOUNTER — Encounter (HOSPITAL_COMMUNITY): Payer: Medicare Other

## 2012-11-22 ENCOUNTER — Encounter (HOSPITAL_COMMUNITY): Payer: Medicare Other

## 2012-11-24 ENCOUNTER — Encounter (HOSPITAL_COMMUNITY): Payer: Medicare Other

## 2012-11-26 ENCOUNTER — Encounter (HOSPITAL_COMMUNITY): Payer: Medicare Other

## 2012-11-29 ENCOUNTER — Encounter (HOSPITAL_COMMUNITY): Payer: Medicare Other

## 2012-12-01 ENCOUNTER — Encounter (HOSPITAL_COMMUNITY): Payer: Medicare Other

## 2012-12-03 ENCOUNTER — Encounter (HOSPITAL_COMMUNITY): Payer: Medicare Other

## 2012-12-03 NOTE — Addendum Note (Signed)
Addended by: Lacie Scotts on: 12/03/2012 04:12 PM   Modules accepted: Orders

## 2012-12-06 ENCOUNTER — Encounter (HOSPITAL_COMMUNITY): Payer: Medicare Other

## 2012-12-08 ENCOUNTER — Encounter (HOSPITAL_COMMUNITY): Payer: Medicare Other

## 2012-12-10 ENCOUNTER — Encounter (HOSPITAL_COMMUNITY): Payer: Medicare Other

## 2012-12-13 ENCOUNTER — Encounter (HOSPITAL_COMMUNITY): Payer: Medicare Other

## 2012-12-15 ENCOUNTER — Encounter (HOSPITAL_COMMUNITY): Payer: Medicare Other

## 2012-12-17 ENCOUNTER — Encounter (HOSPITAL_COMMUNITY): Payer: Medicare Other

## 2012-12-20 ENCOUNTER — Encounter (HOSPITAL_COMMUNITY): Payer: Medicare Other

## 2012-12-22 ENCOUNTER — Encounter (HOSPITAL_COMMUNITY): Payer: Medicare Other

## 2012-12-24 ENCOUNTER — Encounter (HOSPITAL_COMMUNITY): Payer: Medicare Other

## 2012-12-27 ENCOUNTER — Encounter (HOSPITAL_COMMUNITY): Payer: Medicare Other

## 2012-12-29 ENCOUNTER — Encounter (HOSPITAL_COMMUNITY): Payer: Medicare Other

## 2012-12-31 ENCOUNTER — Encounter (HOSPITAL_COMMUNITY): Payer: Medicare Other

## 2013-01-03 ENCOUNTER — Encounter (HOSPITAL_COMMUNITY): Payer: Medicare Other

## 2013-01-05 ENCOUNTER — Encounter (HOSPITAL_COMMUNITY): Payer: Medicare Other

## 2013-01-07 ENCOUNTER — Encounter (HOSPITAL_COMMUNITY): Payer: Medicare Other

## 2013-01-10 ENCOUNTER — Encounter (HOSPITAL_COMMUNITY): Payer: Medicare Other

## 2013-01-12 ENCOUNTER — Encounter (HOSPITAL_COMMUNITY): Payer: Medicare Other

## 2013-01-14 ENCOUNTER — Encounter (HOSPITAL_COMMUNITY): Payer: Medicare Other

## 2013-01-17 ENCOUNTER — Encounter (HOSPITAL_COMMUNITY): Payer: Medicare Other

## 2013-01-19 ENCOUNTER — Encounter (HOSPITAL_COMMUNITY): Payer: Medicare Other

## 2013-01-21 ENCOUNTER — Encounter (HOSPITAL_COMMUNITY): Payer: Medicare Other

## 2013-01-24 ENCOUNTER — Encounter (HOSPITAL_COMMUNITY): Payer: Medicare Other

## 2013-01-25 ENCOUNTER — Other Ambulatory Visit: Payer: Self-pay | Admitting: Dermatology

## 2013-01-26 ENCOUNTER — Encounter (HOSPITAL_COMMUNITY): Payer: Medicare Other

## 2013-01-28 ENCOUNTER — Ambulatory Visit (INDEPENDENT_AMBULATORY_CARE_PROVIDER_SITE_OTHER): Payer: Medicare Other | Admitting: Cardiology

## 2013-01-28 ENCOUNTER — Encounter (HOSPITAL_COMMUNITY): Payer: Medicare Other

## 2013-01-28 ENCOUNTER — Other Ambulatory Visit (INDEPENDENT_AMBULATORY_CARE_PROVIDER_SITE_OTHER): Payer: Medicare Other

## 2013-01-28 ENCOUNTER — Encounter: Payer: Self-pay | Admitting: Cardiology

## 2013-01-28 VITALS — BP 110/68 | HR 81 | Ht 77.0 in | Wt 188.0 lb

## 2013-01-28 DIAGNOSIS — I4891 Unspecified atrial fibrillation: Secondary | ICD-10-CM

## 2013-01-28 DIAGNOSIS — Z9889 Other specified postprocedural states: Secondary | ICD-10-CM

## 2013-01-28 DIAGNOSIS — R0989 Other specified symptoms and signs involving the circulatory and respiratory systems: Secondary | ICD-10-CM

## 2013-01-28 DIAGNOSIS — Z79899 Other long term (current) drug therapy: Secondary | ICD-10-CM

## 2013-01-28 DIAGNOSIS — E7800 Pure hypercholesterolemia, unspecified: Secondary | ICD-10-CM

## 2013-01-28 DIAGNOSIS — E785 Hyperlipidemia, unspecified: Secondary | ICD-10-CM

## 2013-01-28 DIAGNOSIS — I38 Endocarditis, valve unspecified: Secondary | ICD-10-CM

## 2013-01-28 LAB — HEPATIC FUNCTION PANEL
ALT: 19 U/L (ref 0–53)
AST: 21 U/L (ref 0–37)
Albumin: 4 g/dL (ref 3.5–5.2)
Alkaline Phosphatase: 39 U/L (ref 39–117)
Bilirubin, Direct: 0.2 mg/dL (ref 0.0–0.3)
Total Protein: 6.9 g/dL (ref 6.0–8.3)

## 2013-01-28 LAB — BASIC METABOLIC PANEL
BUN: 26 mg/dL — ABNORMAL HIGH (ref 6–23)
Calcium: 9.4 mg/dL (ref 8.4–10.5)
Chloride: 104 mEq/L (ref 96–112)
Creatinine, Ser: 1.1 mg/dL (ref 0.4–1.5)
GFR: 70.71 mL/min (ref >60.00)

## 2013-01-28 LAB — LIPID PANEL
Cholesterol: 139 mg/dL (ref 0–200)
LDL Cholesterol: 85 mg/dL (ref 0–99)
Triglycerides: 66 mg/dL (ref 0.0–149.0)

## 2013-01-28 MED ORDER — FUROSEMIDE 40 MG PO TABS: 40.0000 mg | ORAL_TABLET | Freq: Every day | ORAL | Status: DC

## 2013-01-28 NOTE — Assessment & Plan Note (Signed)
The patient had mitral valve repair on 10/01/11.  He has done well postoperatively.  He sleeps on 2 pillows but has no paroxysmal nocturnal dyspnea and he denies any exertional dyspnea.  He is not having any chest pain.  He is not doing much regular exercise because of his painful arthritic knees.  He is a member of the [employer].  I asked him to look into possible water aerobics as an alternative which would be more compliant to his knee problems.

## 2013-01-28 NOTE — Patient Instructions (Signed)
Will obtain labs today and call you with the results (lp.bmet.hfp)  Your physician has requested that you have an echocardiogram. Echocardiography is a painless test that uses sound waves to create images of your heart. It provides your doctor with information about the size and shape of your heart and how well your heart's chambers and valves are working. This procedure takes approximately one hour. There are no restrictions for this procedure.   Your physician wants you to follow-up in: 4 months with fasting labs (lp/bmet/hfp/cbc) You will receive a reminder letter in the mail two months in advance. If you don't receive a letter, please call our office to schedule the follow-up appointment.   Your physician recommends that you continue on your current medications as directed. Please refer to the Current Medication list given to you today.

## 2013-01-28 NOTE — Progress Notes (Signed)
Quick Note:  Please report to patient. The recent labs are stable. Continue same medication and careful diet. ______ 

## 2013-01-28 NOTE — Assessment & Plan Note (Signed)
The patient has had no recurrence of his atrial fibrillation.  He remains on a baby aspirin daily

## 2013-01-28 NOTE — Progress Notes (Signed)
[name] [name] Date of Birth:  19-Jan-1931 [facility] [address] [place], [place]  [address] [phone]         Fax   [phone]  History of Present Illness: This pleasant 79 year old gentleman is seen for a scheduled followup office visit. He has a past history of severe mitral valve prolapse. He underwent mitral valve repair by Dr. Cornelius Moras on 10/01/11. He did not have any coronary disease. He had a prolonged hospital course but eventually recovered and is now finished the cardiac rehabilitation program and is now exercising at the [facility].  His exercise however has been limited by his arthritis of both knees. A previous decubitus ulcer has completely healed up. He remains in normal sinus rhythm after being in atrial fibrillation postoperatively. He is no longer on warfarin but does take baby aspirin daily. He notes occasional skipped beats usually when it is time for him to take his nighttime dose of Lopressor. This has improved since we switched him to Toprol.  Since her last saw him he has had several more dermatologic surgeries for melanomas of his left shoulder and left forearm.     Current Outpatient Prescriptions  Medication Sig Dispense Refill  . acetaminophen (TYLENOL) 325 MG tablet Take 325 mg by mouth every 6 (six) hours as needed. For pain      . aspirin 81 MG tablet Take 81 mg by mouth daily.      Marland Kitchen atorvastatin (LIPITOR) 10 MG tablet Take 1 tablet (10 mg total) by mouth daily.  90 tablet  3  . celecoxib (CELEBREX) 200 MG capsule Take 200 mg by mouth daily.      . furosemide (LASIX) 40 MG tablet Take 1 tablet (40 mg total) by mouth daily.  90 tablet  3  . Glucosamine-Chondroitin (GLUCOSAMINE CHONDR COMPLEX PO) Take by mouth 3 (three) times daily.      Marland Kitchen lactose free nutrition (BOOST) LIQD Take 1 Container by mouth daily.      . metoprolol succinate (TOPROL-XL) 50 MG 24 hr tablet Take 1 tablet (50 mg total) by mouth daily.  90 tablet  3  . Multiple  Vitamins-Minerals (MULTIVITAMINS THER. W/MINERALS) TABS Take 1 tablet by mouth daily.  30 each    . potassium chloride SA (K-DUR,KLOR-CON) 20 MEQ tablet Take 1 tablet (20 mEq total) by mouth 3 (three) times daily.  270 tablet  3  . RESTASIS 0.05 % ophthalmic emulsion Place 1 drop into both eyes every 12 (twelve) hours.       . traZODone (DESYREL) 100 MG tablet Take 300 mg by mouth at bedtime.       . [DISCONTINUED] pantoprazole (PROTONIX) 40 MG tablet Take 1 tablet (40 mg total) by mouth daily before breakfast.       No current facility-administered medications for this visit.    No Known Allergies  Patient Active Problem List  Diagnosis  . TINEA PEDIS  . ADENOCARCINOMA, PROSTATE  . HYPERCHOLESTEROLEMIA  . THROMBOCYTOPENIA  . DEPRESSION  . HYPERTENSION, BENIGN ESSENTIAL  . GERD  . DERMATITIS, ATOPIC  . CALLUS, LEFT FOOT  . OSTEOARTHRITIS, GENERALIZED, MULTIPLE JOINTS  . BACK PAIN, CHRONIC  . MUSCLE SPASM, BACK  . PERSONAL HISTORY MALIGNANT NEOPLASM PROSTATE  . PERSONAL HISTORY OF MALIGNANT MELANOMA OF SKIN  . ARRHYTHMIA, HX OF  . Personal history of colonic polyps  . Cerumen impaction  . Hearing loss  . Valvular heart disease  . S/P mitral valve repair  . Atrial fibrillation  .  Decubitus skin ulcer  . Pleural effusion due to congestive heart failure  . Encounter for long-term (current) use of anticoagulants  . Hx of mitral valve repair  . Cough  . Melanoma    History  Smoking status  . Never Smoker   Smokeless tobacco  . Never Used    History  Alcohol Use No    Family History  Problem Relation Age of Onset  . Clotting disorder Brother     CVA's  . Arthritis Mother   . Hypertension Mother   . Stroke Mother   . Hypertension Father   . Psychosis Father     psychiatric care  . Colon cancer Neg Hx   . Stomach cancer Neg Hx     Review of Systems: Constitutional: no fever chills diaphoresis or fatigue or change in weight.  Head and neck: no hearing loss,  no epistaxis, no photophobia or visual disturbance. Respiratory: No cough, shortness of breath or wheezing. Cardiovascular: No chest pain peripheral edema, palpitations. Gastrointestinal: No abdominal distention, no abdominal pain, no change in bowel habits hematochezia or melena. Genitourinary: No dysuria, no frequency, no urgency, no nocturia. Musculoskeletal:No arthralgias, no back pain, no gait disturbance or myalgias. Neurological: No dizziness, no headaches, no numbness, no seizures, no syncope, no weakness, no tremors. Hematologic: No lymphadenopathy, no easy bruising. Psychiatric: No confusion, no hallucinations, no sleep disturbance.    Physical Exam: Filed Vitals:   01/28/13 1021  BP: 110/68  Pulse: 81   the general appearance reveals a well-developed well-nourished tall gentleman in no distress.The head and neck exam reveals pupils equal and reactive.  Extraocular movements are full.  There is no scleral icterus.  The mouth and pharynx are normal.  The neck is supple.  The carotids reveal no bruits.  The jugular venous pressure is normal.  The  thyroid is not enlarged.  There is no lymphadenopathy.  The chest is clear to percussion and auscultation.  There are no rales or rhonchi.  Expansion of the chest is symmetrical.  The precordium is quiet.  The first heart sound is normal.  The second heart sound is physiologically split.  There is no murmur gallop rub or click.  There is no abnormal lift or heave.  The abdomen is soft and nontender.  The bowel sounds are normal.  The liver and spleen are not enlarged.  There are no abdominal masses.  There are no abdominal bruits.  Extremities reveal good pedal pulses.  There is no phlebitis or edema.  There is no cyanosis or clubbing.  Strength is normal and symmetrical in all extremities.  There is no lateralizing weakness.  There are no sensory deficits.  The skin is warm and dry.  There is no rash.  EKG today shows normal sinus rhythm with  first degree AV block and a QS pattern in V1 through V3 which reflects his pectus excavatum.   Assessment / Plan: Continue same medication.  We will get an echocardiogram to followup on his mitral valve repair and his left ventricular function.  Recheck in 4 months for followup office visit CBC lipid panel hepatic function panel and basal metabolic panel.  Continue close followup with his dermatologist concerning his ongoing melanoma problems.

## 2013-01-28 NOTE — Assessment & Plan Note (Signed)
The patient is on Lipitor 10 mg daily.  He is not having any myalgias from the Lipitor.  We are checking blood work today.

## 2013-01-31 ENCOUNTER — Encounter (HOSPITAL_COMMUNITY): Payer: Medicare Other

## 2013-02-01 ENCOUNTER — Telehealth: Payer: Self-pay | Admitting: *Deleted

## 2013-02-01 NOTE — Telephone Encounter (Signed)
Message copied by Burnell Blanks on Tue Feb 01, 2013  9:11 AM ------      Message from: Cassell Clement      Created: Fri Jan 28, 2013  6:22 PM       Please report to patient.  The recent labs are stable. Continue same medication and careful diet. ------

## 2013-02-01 NOTE — Telephone Encounter (Signed)
Advised patient of lab results and mailed copy 

## 2013-02-02 ENCOUNTER — Encounter (HOSPITAL_COMMUNITY): Payer: Medicare Other

## 2013-02-03 ENCOUNTER — Ambulatory Visit (HOSPITAL_COMMUNITY): Payer: Medicare Other | Attending: Cardiology | Admitting: Radiology

## 2013-02-03 DIAGNOSIS — E785 Hyperlipidemia, unspecified: Secondary | ICD-10-CM | POA: Insufficient documentation

## 2013-02-03 DIAGNOSIS — I359 Nonrheumatic aortic valve disorder, unspecified: Secondary | ICD-10-CM | POA: Insufficient documentation

## 2013-02-03 DIAGNOSIS — I079 Rheumatic tricuspid valve disease, unspecified: Secondary | ICD-10-CM | POA: Insufficient documentation

## 2013-02-03 DIAGNOSIS — Z9889 Other specified postprocedural states: Secondary | ICD-10-CM

## 2013-02-03 DIAGNOSIS — I059 Rheumatic mitral valve disease, unspecified: Secondary | ICD-10-CM | POA: Insufficient documentation

## 2013-02-03 DIAGNOSIS — I10 Essential (primary) hypertension: Secondary | ICD-10-CM | POA: Insufficient documentation

## 2013-02-03 DIAGNOSIS — I4891 Unspecified atrial fibrillation: Secondary | ICD-10-CM

## 2013-02-03 NOTE — Progress Notes (Signed)
Echocardiogram performed.  

## 2013-02-04 ENCOUNTER — Encounter (HOSPITAL_COMMUNITY): Payer: Medicare Other

## 2013-02-07 ENCOUNTER — Encounter (HOSPITAL_COMMUNITY): Payer: Medicare Other

## 2013-02-07 ENCOUNTER — Other Ambulatory Visit: Payer: Self-pay | Admitting: Dermatology

## 2013-02-08 ENCOUNTER — Telehealth: Payer: Self-pay | Admitting: *Deleted

## 2013-02-08 NOTE — Telephone Encounter (Signed)
Advised of echo  

## 2013-02-08 NOTE — Telephone Encounter (Signed)
Message copied by Burnell Blanks on Tue Feb 08, 2013  2:03 PM ------      Message from: Cassell Clement      Created: Mon Feb 07, 2013  8:31 PM       The post-op echo was very good.  No longer has any regurgitation. Good LV function. ------

## 2013-02-09 ENCOUNTER — Encounter (HOSPITAL_COMMUNITY): Payer: Medicare Other

## 2013-02-11 ENCOUNTER — Encounter (HOSPITAL_COMMUNITY): Payer: Medicare Other

## 2013-02-14 ENCOUNTER — Encounter (HOSPITAL_COMMUNITY): Payer: Medicare Other

## 2013-02-16 ENCOUNTER — Encounter (HOSPITAL_COMMUNITY): Payer: Medicare Other

## 2013-02-16 ENCOUNTER — Other Ambulatory Visit: Payer: Self-pay | Admitting: Dermatology

## 2013-02-18 ENCOUNTER — Encounter (HOSPITAL_COMMUNITY): Payer: Medicare Other

## 2013-02-21 ENCOUNTER — Encounter (HOSPITAL_COMMUNITY): Payer: Medicare Other

## 2013-02-23 ENCOUNTER — Other Ambulatory Visit: Payer: Self-pay | Admitting: *Deleted

## 2013-02-23 ENCOUNTER — Encounter (HOSPITAL_COMMUNITY): Payer: Medicare Other

## 2013-02-23 MED ORDER — POTASSIUM CHLORIDE CRYS ER 20 MEQ PO TBCR: 20.0000 meq | EXTENDED_RELEASE_TABLET | Freq: Three times a day (TID) | ORAL | Status: DC

## 2013-02-25 ENCOUNTER — Encounter (HOSPITAL_COMMUNITY): Payer: Medicare Other

## 2013-02-28 ENCOUNTER — Encounter (HOSPITAL_COMMUNITY): Payer: Medicare Other

## 2013-03-02 ENCOUNTER — Encounter (HOSPITAL_COMMUNITY): Payer: Medicare Other

## 2013-03-03 ENCOUNTER — Encounter: Payer: Self-pay | Admitting: Internal Medicine

## 2013-03-04 ENCOUNTER — Encounter (HOSPITAL_COMMUNITY): Payer: Medicare Other

## 2013-03-07 ENCOUNTER — Encounter (HOSPITAL_COMMUNITY): Payer: Medicare Other

## 2013-03-09 ENCOUNTER — Encounter (HOSPITAL_COMMUNITY): Payer: Medicare Other

## 2013-03-11 ENCOUNTER — Encounter (HOSPITAL_COMMUNITY): Payer: Medicare Other

## 2013-03-14 ENCOUNTER — Encounter (HOSPITAL_COMMUNITY): Payer: Medicare Other

## 2013-03-16 ENCOUNTER — Encounter (HOSPITAL_COMMUNITY): Payer: Medicare Other

## 2013-03-18 ENCOUNTER — Encounter (HOSPITAL_COMMUNITY): Payer: Medicare Other

## 2013-03-21 ENCOUNTER — Encounter (HOSPITAL_COMMUNITY): Payer: Medicare Other

## 2013-03-23 ENCOUNTER — Encounter (HOSPITAL_COMMUNITY): Payer: Medicare Other

## 2013-03-25 ENCOUNTER — Encounter (HOSPITAL_COMMUNITY): Payer: Medicare Other

## 2013-03-28 ENCOUNTER — Other Ambulatory Visit: Payer: Self-pay | Admitting: Dermatology

## 2013-03-28 ENCOUNTER — Encounter (HOSPITAL_COMMUNITY): Payer: Medicare Other

## 2013-03-30 ENCOUNTER — Encounter (HOSPITAL_COMMUNITY): Payer: Medicare Other

## 2013-04-01 ENCOUNTER — Encounter (HOSPITAL_COMMUNITY): Payer: Medicare Other

## 2013-04-04 ENCOUNTER — Encounter (HOSPITAL_COMMUNITY): Payer: Medicare Other

## 2013-04-06 ENCOUNTER — Encounter (HOSPITAL_COMMUNITY): Payer: Medicare Other

## 2013-04-08 ENCOUNTER — Encounter (HOSPITAL_COMMUNITY): Payer: Medicare Other

## 2013-04-13 ENCOUNTER — Encounter (HOSPITAL_COMMUNITY): Payer: Medicare Other

## 2013-04-15 ENCOUNTER — Encounter (HOSPITAL_COMMUNITY): Payer: Medicare Other

## 2013-04-18 ENCOUNTER — Encounter (HOSPITAL_COMMUNITY): Payer: Medicare Other

## 2013-04-19 ENCOUNTER — Ambulatory Visit (INDEPENDENT_AMBULATORY_CARE_PROVIDER_SITE_OTHER): Payer: Medicare Other | Admitting: Family

## 2013-04-19 ENCOUNTER — Encounter: Payer: Self-pay | Admitting: Family

## 2013-04-19 VITALS — BP 110/78 | HR 74 | Temp 97.8°F | Resp 16 | Wt 193.0 lb

## 2013-04-19 DIAGNOSIS — M545 Low back pain: Secondary | ICD-10-CM

## 2013-04-19 MED ORDER — TRAMADOL HCL 50 MG PO TABS: 50.0000 mg | ORAL_TABLET | Freq: Three times a day (TID) | ORAL | Status: DC | PRN

## 2013-04-19 NOTE — Assessment & Plan Note (Signed)
Likely musculoskeletal in nature. Already on Celebrex, recommended tylenol or tramadol (pt aware not to drive after taking) prn.  Call if worsening symptoms or if not improved in next few weeks.

## 2013-04-19 NOTE — Progress Notes (Signed)
Subjective:    Patient ID: [name] [name], male    DOB: March 07, 1931, 79 y.o.   MRN: [id number]  HPI   [name] [name] is an 79 yr old male who presents today with chief complaint of low back pain.  Reports that symptoms are located on the right side and are worse with movement.  Symptoms started 2 days ago. Reports pain is worse with walking. Reports that he worked out at [employer] on Saturday and it started to hurt later that day.  Denies associated weakness or radiation of pain. Denies hematuria, dysuria or difficulty voiding.   Review of Systems    see HPI  Past Medical History  Diagnosis Date  . Prostate cancer   . Melanoma     Left Shoulder  . Vision abnormalities     Cornea scarring  . Osteoarthritis     Knees  . Hypertension   . Pure hypercholesterolemia   . Depressive disorder, not elsewhere classified   . MVP (mitral valve prolapse)   . Personal history of colonic polyps   . Valvular heart disease     Last echo in 2010 with EF 55 to 60%, +MVP, mild MR, diastolic dysfunction, biatrial enlargement, mild AI and mild pulmonary HTN  . Normal nuclear stress test 2007  . Thrombocytopenia   . Mitral regurgitation   . MVP (mitral valve prolapse)   . PVC (premature ventricular contraction)   . Shortness of breath   . Blood transfusion   . Neuropathy   . Wears glasses   . Heart murmur     Dr Patty Sermons cardiologist  . Adenomatous polyps     History   Social History  . Marital Status: Widowed    Spouse Name: N/A    Number of Children: N/A  . Years of Education: N/A   Occupational History  . Not on file.   Social History Main Topics  . Smoking status: Never Smoker   . Smokeless tobacco: Never Used  . Alcohol Use: No  . Drug Use: No  . Sexually Active: Not on file   Other Topics Concern  . Not on file   Social History Narrative   Retired - Airline pilot   Widower   2 children    Past Surgical History  Procedure Laterality Date  . Nuclear stress test  09/2006     EF-64%, Normal  . US echocardiography  09/2009, 08/1011    mild LVH,mild AI,MVP with mild MR, mild-mod. TR with mild Pulm. HTN, EF-55-60%  . Inguinal hernia repair  09/2009    Left  . Knee arthroscopy       left x3  and right x2  . Colonoscopy w/ polypectomy    . Prostatectomy  1993  . Rotator cuff repair  2003    left  . Melanoma surgery      2001, 2005, 2006, 2009  . Tee without cardioversion  09/26/2011    Procedure: TRANSESOPHAGEAL ECHOCARDIOGRAM (TEE);  Surgeon: Lewayne Bunting, MD;  Location: [facility] ENDOSCOPY;  Service: Cardiovascular;  Laterality: N/A;  . Mitral valve repair  10/01/2011    complex valvuloplasty with Goretex cord replacement and chordal transposition 32mm Sorin Memo 3D ring annuloplasty  . Root canal  08-19-12    Family History  Problem Relation Age of Onset  . Clotting disorder Brother     CVA's  . Arthritis Mother   . Hypertension Mother   . Stroke Mother   . Hypertension Father   . Psychosis Father  psychiatric care  . Colon cancer Neg Hx   . Stomach cancer Neg Hx     No Known Allergies  Current Outpatient Prescriptions on File Prior to Visit  Medication Sig Dispense Refill  . acetaminophen (TYLENOL) 325 MG tablet Take 325 mg by mouth every 6 (six) hours as needed. For pain      . aspirin 81 MG tablet Take 81 mg by mouth daily.      Marland Kitchen atorvastatin (LIPITOR) 10 MG tablet Take 1 tablet (10 mg total) by mouth daily.  90 tablet  3  . celecoxib (CELEBREX) 200 MG capsule Take 200 mg by mouth daily.      . furosemide (LASIX) 40 MG tablet Take 1 tablet (40 mg total) by mouth daily.  90 tablet  3  . Glucosamine-Chondroitin (GLUCOSAMINE CHONDR COMPLEX PO) Take by mouth 3 (three) times daily.      Marland Kitchen lactose free nutrition (BOOST) LIQD Take 1 Container by mouth daily.      . metoprolol succinate (TOPROL-XL) 50 MG 24 hr tablet Take 1 tablet (50 mg total) by mouth daily.  90 tablet  3  . Multiple Vitamins-Minerals (MULTIVITAMINS THER. W/MINERALS) TABS Take  1 tablet by mouth daily.  30 each    . potassium chloride SA (K-DUR,KLOR-CON) 20 MEQ tablet Take 1 tablet (20 mEq total) by mouth 3 (three) times daily.  270 tablet  3  . RESTASIS 0.05 % ophthalmic emulsion Place 1 drop into both eyes every 12 (twelve) hours.       . traZODone (DESYREL) 100 MG tablet Take 300 mg by mouth at bedtime.       . [DISCONTINUED] pantoprazole (PROTONIX) 40 MG tablet Take 1 tablet (40 mg total) by mouth daily before breakfast.       No current facility-administered medications on file prior to visit.    BP 110/78  Pulse 74  Temp(Src) 97.8 F (36.6 C) (Oral)  Resp 16  Wt 193 lb 0.6 oz (87.562 kg)  BMI 22.89 kg/m2  SpO2 97%    Objective:   Physical Exam  Constitutional: He is oriented to person, place, and time. He appears well-developed and well-nourished. No distress.  Cardiovascular: Normal rate and regular rhythm.   No murmur heard. Pulmonary/Chest: Effort normal and breath sounds normal. No respiratory distress. He has no wheezes. He has no rales. He exhibits no tenderness.  Musculoskeletal:       Thoracic back: He exhibits no tenderness.       Lumbar back: He exhibits no tenderness.  + right low back pain with left straight leg raise Steady even gait  Neurological: He is alert and oriented to person, place, and time.  Skin: Skin is warm and dry.  Psychiatric: He has a normal mood and affect. His behavior is normal. Judgment and thought content normal.          Assessment & Plan:

## 2013-04-20 ENCOUNTER — Encounter (HOSPITAL_COMMUNITY): Payer: Medicare Other

## 2013-04-20 ENCOUNTER — Telehealth: Payer: Self-pay | Admitting: *Deleted

## 2013-04-20 MED ORDER — TRAMADOL HCL 50 MG PO TABS: 50.0000 mg | ORAL_TABLET | Freq: Three times a day (TID) | ORAL | Status: DC | PRN

## 2013-04-20 NOTE — Telephone Encounter (Signed)
Received message from pt that [facility] did not receive Rx from yesterday. Upon review of EPIC I see that tramadol went to [facility]. Rx sent to [facility]. Left detailed message on home # re: Rx completion and to call if any questions.

## 2013-04-22 ENCOUNTER — Encounter (HOSPITAL_COMMUNITY): Payer: Medicare Other

## 2013-04-25 ENCOUNTER — Encounter (HOSPITAL_COMMUNITY): Payer: Medicare Other

## 2013-04-27 ENCOUNTER — Encounter (HOSPITAL_COMMUNITY): Payer: Medicare Other

## 2013-04-29 ENCOUNTER — Encounter (HOSPITAL_COMMUNITY): Payer: Medicare Other

## 2013-05-02 ENCOUNTER — Encounter (HOSPITAL_COMMUNITY): Payer: Medicare Other

## 2013-05-04 ENCOUNTER — Encounter (HOSPITAL_COMMUNITY): Payer: Medicare Other

## 2013-05-06 ENCOUNTER — Encounter (HOSPITAL_COMMUNITY): Payer: Medicare Other

## 2013-05-09 ENCOUNTER — Encounter (HOSPITAL_COMMUNITY): Payer: Medicare Other

## 2013-05-11 ENCOUNTER — Encounter (HOSPITAL_COMMUNITY): Payer: Medicare Other

## 2013-05-12 ENCOUNTER — Ambulatory Visit (INDEPENDENT_AMBULATORY_CARE_PROVIDER_SITE_OTHER): Payer: Medicare Other | Admitting: Family Medicine

## 2013-05-12 ENCOUNTER — Encounter: Payer: Self-pay | Admitting: Family Medicine

## 2013-05-12 VITALS — BP 110/70 | HR 78 | Temp 97.8°F | Ht 76.5 in | Wt 192.1 lb

## 2013-05-12 DIAGNOSIS — J3489 Other specified disorders of nose and nasal sinuses: Secondary | ICD-10-CM

## 2013-05-12 DIAGNOSIS — I10 Essential (primary) hypertension: Secondary | ICD-10-CM

## 2013-05-12 MED ORDER — MUPIROCIN 2 % EX OINT: TOPICAL_OINTMENT | Freq: Two times a day (BID) | CUTANEOUS | Status: DC

## 2013-05-12 MED ORDER — SULFAMETHOXAZOLE-TRIMETHOPRIM 800-160 MG PO TABS: 1.0000 | ORAL_TABLET | Freq: Two times a day (BID) | ORAL | Status: DC

## 2013-05-12 NOTE — Patient Instructions (Addendum)
  Start a probiotic such as Digestive Advantage by [employer] Call if no improvement in a week   Staphylococcal Infections  Staphylococcus aureus (Staph) is a germ that may cause infections, especially on broken skin or wounds. Methicillin is a drug sometimes used to treat Staph infections. If the germ is resistant to methicillin, it is called MRSA. Methicillin and some other drugs may not work to treat the infection. However, there are other antibiotic drugs that may be used that will treat the infection. Your caregiver may do a culture from your wound, skin, or other site. This will tell him/her that you have MRSA present. Sometimes healthy people carry MRSA, but it may also cause an infection.  Staph infections, including MRSA can spread from one person to another by contact with an infected person. You may prevent spreading an MRSA infection to those you live with or others around you by following these steps:  Keep infections and pus or drainage material covered with clean dry bandages. Follow your caregiver's instructions on proper care of the wound. Pus from infected wounds can contain MRSA and spread the germ (bacteria) to others.  Advise your family and other close contacts to wash their hands frequently with soap and warm water. This should be done especially if they change your bandages or touch the infected wound or infectious materials.  Avoid sharing personal items (towels, washcloth, razor, clothing, or uniforms) that may have had contact with the infected wound.  Wash linens and clothes that become soiled, with hot water and laundry detergent. Drying clothes in a hot dryer, rather than air-drying, also helps kill bacteria in clothes.  Tell any healthcare providers who treat you that you have an MRSA infection. In the hospital steps will be taken to prevent the spread of MRSA.  Ask your caregiver about return to school or return to work if you have a Staph or MRSA infection.  If your  caregiver has given you a follow-up appointment, it is very important to keep that appointment. Not keeping the appointment could result in a chronic or permanent injury, pain, and disability. If there is any problem keeping the appointment, you must call back to this facility for assistance. Staph and MRSA infections can become very serious and you should contact your caregiver if your infection gets worse. To fight the infection, follow your doctor's instructions for wound care and take all medicines as prescribed. SEEK MEDICAL CARE IF:   You have increased pus coming from the wound.  You have a fever.  You notice a bad smell coming from the wound or dressing. SEEK IMMEDIATE MEDICAL CARE IF:  You have redness, red streaks, swelling, or increasing pain in the wound. Document Released: 01/24/2003 Document Revised: 01/26/2012 Document Reviewed: 06/19/2008 [facility] Patient Information 2014 [place], [place].

## 2013-05-13 ENCOUNTER — Encounter (HOSPITAL_COMMUNITY): Payer: Medicare Other

## 2013-05-15 ENCOUNTER — Encounter: Payer: Self-pay | Admitting: Family Medicine

## 2013-05-15 DIAGNOSIS — J3489 Other specified disorders of nose and nasal sinuses: Secondary | ICD-10-CM

## 2013-05-15 HISTORY — DX: Other specified disorders of nose and nasal sinuses: J34.89

## 2013-05-15 NOTE — Progress Notes (Signed)
Patient ID: [name] [name], male   DOB: 02-Nov-1931, 79 y.o.   MRN: [id number] [name] [name] [id number] 10-29-1931 05/15/2013      Progress Note-Follow Up  Subjective  Chief Complaint  Chief Complaint  Patient presents with  . sore in nose    X 3 weeks- right nostril    HPI  Patient is an 79 year old Caucasian male who is in today complaining of soreness to his present for 3 weeks. He's never had a similar lesion. Didn't have a staph infection in December. No fevers or chills. No nasal congestion or cough. No headache, shortness of breath, GI or GU complaints are noted at this time.  Past Medical History  Diagnosis Date  . Prostate cancer   . Melanoma     Left Shoulder  . Vision abnormalities     Cornea scarring  . Osteoarthritis     Knees  . Hypertension   . Pure hypercholesterolemia   . Depressive disorder, not elsewhere classified   . MVP (mitral valve prolapse)   . Personal history of colonic polyps   . Valvular heart disease     Last echo in 2010 with EF 55 to 60%, +MVP, mild MR, diastolic dysfunction, biatrial enlargement, mild AI and mild pulmonary HTN  . Normal nuclear stress test 2007  . Thrombocytopenia   . Mitral regurgitation   . MVP (mitral valve prolapse)   . PVC (premature ventricular contraction)   . Shortness of breath   . Blood transfusion   . Neuropathy   . Wears glasses   . Heart murmur     Dr Patty Sermons cardiologist  . Adenomatous polyps   . Internal nasal lesion 05/15/2013    Past Surgical History  Procedure Laterality Date  . Nuclear stress test  09/2006    EF-64%, Normal  . US echocardiography  09/2009, 08/1011    mild LVH,mild AI,MVP with mild MR, mild-mod. TR with mild Pulm. HTN, EF-55-60%  . Inguinal hernia repair  09/2009    Left  . Knee arthroscopy       left x3  and right x2  . Colonoscopy w/ polypectomy    . Prostatectomy  1993  . Rotator cuff repair  2003    left  . Melanoma surgery      2001, 2005, 2006, 2009  . Tee  without cardioversion  09/26/2011    Procedure: TRANSESOPHAGEAL ECHOCARDIOGRAM (TEE);  Surgeon: Lewayne Bunting, MD;  Location: [facility] ENDOSCOPY;  Service: Cardiovascular;  Laterality: N/A;  . Mitral valve repair  10/01/2011    complex valvuloplasty with Goretex cord replacement and chordal transposition 32mm Sorin Memo 3D ring annuloplasty  . Root canal  08-19-12    Family History  Problem Relation Age of Onset  . Clotting disorder Brother     CVA's  . Arthritis Mother   . Hypertension Mother   . Stroke Mother   . Hypertension Father   . Psychosis Father     psychiatric care  . Colon cancer Neg Hx   . Stomach cancer Neg Hx     History   Social History  . Marital Status: Widowed    Spouse Name: N/A    Number of Children: N/A  . Years of Education: N/A   Occupational History  . Not on file.   Social History Main Topics  . Smoking status: Never Smoker   . Smokeless tobacco: Never Used  . Alcohol Use: No  . Drug Use: No  . Sexually Active: Not on  file   Other Topics Concern  . Not on file   Social History Narrative   Retired - Airline pilot   Widower   2 children    Current Outpatient Prescriptions on File Prior to Visit  Medication Sig Dispense Refill  . acetaminophen (TYLENOL) 325 MG tablet Take 325 mg by mouth every 6 (six) hours as needed. For pain      . aspirin 81 MG tablet Take 81 mg by mouth daily.      Marland Kitchen atorvastatin (LIPITOR) 10 MG tablet Take 1 tablet (10 mg total) by mouth daily.  90 tablet  3  . celecoxib (CELEBREX) 200 MG capsule Take 200 mg by mouth daily.      . furosemide (LASIX) 40 MG tablet Take 1 tablet (40 mg total) by mouth daily.  90 tablet  3  . Glucosamine-Chondroitin (GLUCOSAMINE CHONDR COMPLEX PO) Take by mouth 3 (three) times daily.      Marland Kitchen lactose free nutrition (BOOST) LIQD Take 1 Container by mouth daily.      . metoprolol succinate (TOPROL-XL) 50 MG 24 hr tablet Take 1 tablet (50 mg total) by mouth daily.  90 tablet  3  . Multiple  Vitamins-Minerals (MULTIVITAMINS THER. W/MINERALS) TABS Take 1 tablet by mouth daily.  30 each    . potassium chloride SA (K-DUR,KLOR-CON) 20 MEQ tablet Take 1 tablet (20 mEq total) by mouth 3 (three) times daily.  270 tablet  3  . RESTASIS 0.05 % ophthalmic emulsion Place 1 drop into both eyes every 12 (twelve) hours.       . traZODone (DESYREL) 100 MG tablet Take 300 mg by mouth at bedtime.       . vitamin C (ASCORBIC ACID) 500 MG tablet Take 500 mg by mouth 2 (two) times daily.      . [DISCONTINUED] pantoprazole (PROTONIX) 40 MG tablet Take 1 tablet (40 mg total) by mouth daily before breakfast.       No current facility-administered medications on file prior to visit.    No Known Allergies  Review of Systems  Review of Systems  Constitutional: Negative for fever and malaise/fatigue.  HENT: Negative for congestion.   Eyes: Negative for discharge.  Respiratory: Negative for shortness of breath.   Cardiovascular: Negative for chest pain, palpitations and leg swelling.  Gastrointestinal: Negative for nausea, abdominal pain and diarrhea.  Genitourinary: Negative for dysuria.  Musculoskeletal: Negative for falls.  Skin: Negative for rash.  Neurological: Negative for loss of consciousness and headaches.  Endo/Heme/Allergies: Negative for polydipsia.  Psychiatric/Behavioral: Negative for depression and suicidal ideas. The patient is not nervous/anxious and does not have insomnia.     Objective  BP 110/70  Pulse 78  Temp(Src) 97.8 F (36.6 C) (Oral)  Ht 6' 4.5" (1.943 m)  Wt 192 lb 1.9 oz (87.145 kg)  BMI 23.08 kg/m2  SpO2 94%  Physical Exam  Physical Exam  Constitutional: He is oriented to person, place, and time and well-developed, well-nourished, and in no distress. No distress.  HENT:  Head: Normocephalic and atraumatic.  Scabbed lesion at entrance to nares on right, along septum  Eyes: Conjunctivae are normal.  Neck: Neck supple. No thyromegaly present.   Cardiovascular: Normal rate, regular rhythm and normal heart sounds.  Exam reveals no gallop.   No murmur heard. Pulmonary/Chest: Effort normal and breath sounds normal. No respiratory distress.  Abdominal: He exhibits no distension and no mass. There is no tenderness.  Musculoskeletal: He exhibits no edema.  Neurological: He is alert  and oriented to person, place, and time.  Skin: Skin is warm.  Psychiatric: Memory, affect and judgment normal.    No results found for this basename: TSH   Lab Results  Component Value Date   WBC 7.9 03/26/2012   HGB 14.3 03/26/2012   HCT 44.2 03/26/2012   MCV 85.7 03/26/2012   PLT 112.0* 03/26/2012   Lab Results  Component Value Date   CREATININE 1.1 01/28/2013   BUN 26* 01/28/2013   NA 139 01/28/2013   K 4.8 01/28/2013   CL 104 01/28/2013   CO2 29 01/28/2013   Lab Results  Component Value Date   ALT 19 01/28/2013   AST 21 01/28/2013   ALKPHOS 39 01/28/2013   BILITOT 1.4* 01/28/2013   Lab Results  Component Value Date   CHOL 139 01/28/2013   Lab Results  Component Value Date   HDL 40.90 01/28/2013   Lab Results  Component Value Date   LDLCALC 85 01/28/2013   Lab Results  Component Value Date   TRIG 66.0 01/28/2013   Lab Results  Component Value Date   CHOLHDL 3 01/28/2013     Assessment & Plan  Internal nasal lesion Right side at opening to nares on septum. Likely staph setart Bactrim and Bactroban. Start a probiotic and report if no improvement  HYPERTENSION, BENIGN ESSENTIAL Well controlled on current meds, no changes

## 2013-05-15 NOTE — Assessment & Plan Note (Signed)
Right side at opening to nares on septum. Likely staph setart Bactrim and Bactroban. Start a probiotic and report if no improvement

## 2013-05-15 NOTE — Assessment & Plan Note (Signed)
Well controlled on current meds, no changes 

## 2013-05-16 ENCOUNTER — Encounter (HOSPITAL_COMMUNITY): Payer: Medicare Other

## 2013-05-18 ENCOUNTER — Encounter (HOSPITAL_COMMUNITY): Payer: Medicare Other

## 2013-05-23 ENCOUNTER — Encounter (HOSPITAL_COMMUNITY): Payer: Medicare Other

## 2013-05-25 ENCOUNTER — Encounter (HOSPITAL_COMMUNITY): Payer: Medicare Other

## 2013-05-27 ENCOUNTER — Encounter (HOSPITAL_COMMUNITY): Payer: Medicare Other

## 2013-05-30 ENCOUNTER — Encounter (HOSPITAL_COMMUNITY): Payer: Medicare Other

## 2013-06-01 ENCOUNTER — Encounter (HOSPITAL_COMMUNITY): Payer: Medicare Other

## 2013-06-03 ENCOUNTER — Encounter (HOSPITAL_COMMUNITY): Payer: Medicare Other

## 2013-06-06 ENCOUNTER — Encounter (HOSPITAL_COMMUNITY): Payer: Medicare Other

## 2013-06-06 ENCOUNTER — Telehealth: Payer: Self-pay

## 2013-06-06 ENCOUNTER — Other Ambulatory Visit: Payer: Self-pay | Admitting: Family Medicine

## 2013-06-06 DIAGNOSIS — J3489 Other specified disorders of nose and nasal sinuses: Secondary | ICD-10-CM

## 2013-06-06 NOTE — Telephone Encounter (Signed)
Referral placed to ENT

## 2013-06-06 NOTE — Telephone Encounter (Signed)
Pt called and states he was in a few weeks ago for a sore in his nose. Pt stated he was given antibiotics and was told that he may have to see a specialist if it doesn't get any better.  Pt states its a little better but not much and would like to be referred?  Please advise?

## 2013-06-08 ENCOUNTER — Encounter (HOSPITAL_COMMUNITY): Payer: Medicare Other

## 2013-06-09 ENCOUNTER — Ambulatory Visit (INDEPENDENT_AMBULATORY_CARE_PROVIDER_SITE_OTHER): Payer: Medicare Other | Admitting: Cardiology

## 2013-06-09 ENCOUNTER — Encounter: Payer: Self-pay | Admitting: Cardiology

## 2013-06-09 ENCOUNTER — Other Ambulatory Visit: Payer: Medicare Other

## 2013-06-09 VITALS — BP 120/72 | HR 75 | Ht 77.0 in | Wt 191.4 lb

## 2013-06-09 DIAGNOSIS — I4891 Unspecified atrial fibrillation: Secondary | ICD-10-CM

## 2013-06-09 DIAGNOSIS — I10 Essential (primary) hypertension: Secondary | ICD-10-CM

## 2013-06-09 DIAGNOSIS — Z9889 Other specified postprocedural states: Secondary | ICD-10-CM

## 2013-06-09 DIAGNOSIS — E7800 Pure hypercholesterolemia, unspecified: Secondary | ICD-10-CM

## 2013-06-09 LAB — HEPATIC FUNCTION PANEL
AST: 21 U/L (ref 0–37)
Albumin: 4.1 g/dL (ref 3.5–5.2)
Total Protein: 6.8 g/dL (ref 6.0–8.3)

## 2013-06-09 LAB — CBC WITH DIFFERENTIAL/PLATELET
Basophils Absolute: 0 10*3/uL (ref 0.0–0.1)
Eosinophils Absolute: 0 10*3/uL (ref 0.0–0.7)
Eosinophils Relative: 0.4 % (ref 0.0–5.0)
HCT: 44.4 % (ref 39.0–52.0)
Lymphs Abs: 1.8 10*3/uL (ref 0.7–4.0)
MCHC: 34.4 g/dL (ref 30.0–36.0)
MCV: 91.5 fl (ref 78.0–100.0)
Monocytes Absolute: 1 10*3/uL (ref 0.1–1.0)
Neutrophils Relative %: 58.1 % (ref 43.0–77.0)
Platelets: 80 10*3/uL — ABNORMAL LOW (ref 150.0–400.0)
RDW: 13.2 % (ref 11.5–14.6)
WBC: 6.6 10*3/uL (ref 4.5–10.5)

## 2013-06-09 LAB — LIPID PANEL
Cholesterol: 143 mg/dL (ref 0–200)
HDL: 41.9 mg/dL (ref >39.00)
LDL Cholesterol: 85 mg/dL (ref 0–99)
Total CHOL/HDL Ratio: 3
Triglycerides: 81 mg/dL (ref 0.0–149.0)

## 2013-06-09 LAB — BASIC METABOLIC PANEL
CO2: 28 mEq/L (ref 19–32)
Calcium: 9.7 mg/dL (ref 8.4–10.5)
Chloride: 104 mEq/L (ref 96–112)
Sodium: 138 mEq/L (ref 135–145)

## 2013-06-09 NOTE — Progress Notes (Signed)
[name] [name] Date of Birth:  03-25-1931 [facility] [address] [place], [place]  [address] [phone]         Fax   [phone]  History of Present Illness: This pleasant 79 year old gentleman is seen for a scheduled followup office visit. He has a past history of severe mitral valve prolapse. He underwent mitral valve repair by Dr. Cornelius Moras on 10/01/11. He did not have any coronary disease. He had a prolonged hospital course but eventually recovered and is now finished the cardiac rehabilitation program and is now exercising at the [facility]. His exercise however has been limited by his arthritis of both knees. A previous decubitus ulcer has completely healed up. He remains in normal sinus rhythm after being in atrial fibrillation postoperatively. He is no longer on warfarin but does take baby aspirin daily. He notes occasional skipped beats usually when it is time for him to take his nighttime dose of Lopressor. This has improved since we switched him to Toprol. Since her last saw him he has had several more dermatologic surgeries for melanomas of his left shoulder and left forearm.  The patient underwent an echocardiogram 02/03/13 showing normal left ventricular systolic function with ejection fraction 55-60%.  He does not have any mitral regurgitation and he has mild mitral stenosis and the mitral valve repair looks good.   Current Outpatient Prescriptions  Medication Sig Dispense Refill  . acetaminophen (TYLENOL) 325 MG tablet Take 325 mg by mouth every 6 (six) hours as needed. For pain      . aspirin 81 MG tablet Take 81 mg by mouth daily.      Marland Kitchen atorvastatin (LIPITOR) 10 MG tablet Take 1 tablet (10 mg total) by mouth daily.  90 tablet  3  . celecoxib (CELEBREX) 200 MG capsule Take 200 mg by mouth daily.      . furosemide (LASIX) 40 MG tablet Take 1 tablet (40 mg total) by mouth daily.  90 tablet  3  . Glucosamine-Chondroitin (GLUCOSAMINE CHONDR COMPLEX PO)  Take by mouth 3 (three) times daily.      Marland Kitchen lactose free nutrition (BOOST) LIQD Take 1 Container by mouth daily.      . metoprolol succinate (TOPROL-XL) 50 MG 24 hr tablet Take 1 tablet (50 mg total) by mouth daily.  90 tablet  3  . Multiple Vitamins-Minerals (MULTIVITAMINS THER. W/MINERALS) TABS Take 1 tablet by mouth daily.  30 each    . potassium chloride SA (K-DUR,KLOR-CON) 20 MEQ tablet Take 1 tablet (20 mEq total) by mouth 3 (three) times daily.  270 tablet  3  . RESTASIS 0.05 % ophthalmic emulsion Place 1 drop into both eyes every 12 (twelve) hours.       . traZODone (DESYREL) 100 MG tablet Take 300 mg by mouth at bedtime.       . vitamin C (ASCORBIC ACID) 500 MG tablet Take 500 mg by mouth 2 (two) times daily.      . [DISCONTINUED] pantoprazole (PROTONIX) 40 MG tablet Take 1 tablet (40 mg total) by mouth daily before breakfast.       No current facility-administered medications for this visit.    No Known Allergies  Patient Active Problem List   Diagnosis Date Noted  . HYPERCHOLESTEROLEMIA 06/09/2008    Priority: High  . OSTEOARTHRITIS, GENERALIZED, MULTIPLE JOINTS 06/09/2008    Priority: Medium  . Internal nasal lesion 05/15/2013  . Low back pain 04/19/2013  . Melanoma 09/30/2012  . Cough 03/26/2012  .  Hx of mitral valve repair 11/19/2011  . Encounter for long-term (current) use of anticoagulants 11/03/2011  . Decubitus skin ulcer 10/28/2011  . Pleural effusion due to congestive heart failure 10/28/2011  . Atrial fibrillation 10/07/2011  . S/P mitral valve repair 10/01/2011  . Valvular heart disease 08/21/2011  . Cerumen impaction 04/09/2011  . Hearing loss 04/09/2011  . THROMBOCYTOPENIA 09/19/2010  . ADENOCARCINOMA, PROSTATE 09/17/2010  . CALLUS, LEFT FOOT 09/17/2010  . BACK PAIN, CHRONIC 09/17/2010  . TINEA PEDIS 05/28/2009  . DERMATITIS, ATOPIC 04/10/2009  . DEPRESSION 06/09/2008  . HYPERTENSION, BENIGN ESSENTIAL 06/09/2008  . GERD 06/09/2008  . MUSCLE SPASM,  BACK 06/09/2008  . PERSONAL HISTORY MALIGNANT NEOPLASM PROSTATE 06/09/2008  . PERSONAL HISTORY OF MALIGNANT MELANOMA OF SKIN 06/09/2008  . ARRHYTHMIA, HX OF 06/09/2008  . Personal history of colonic polyps 06/09/2008    History  Smoking status  . Never Smoker   Smokeless tobacco  . Never Used    History  Alcohol Use No    Family History  Problem Relation Age of Onset  . Clotting disorder Brother     CVA's  . Arthritis Mother   . Hypertension Mother   . Stroke Mother   . Hypertension Father   . Psychosis Father     psychiatric care  . Colon cancer Neg Hx   . Stomach cancer Neg Hx     Review of Systems: Constitutional: no fever chills diaphoresis or fatigue or change in weight.  Head and neck: no hearing loss, no epistaxis, no photophobia or visual disturbance. Respiratory: No cough, shortness of breath or wheezing. Cardiovascular: No chest pain peripheral edema, palpitations. Gastrointestinal: No abdominal distention, no abdominal pain, no change in bowel habits hematochezia or melena. Genitourinary: No dysuria, no frequency, no urgency, no nocturia. Musculoskeletal:No arthralgias, no back pain, no gait disturbance or myalgias. Neurological: No dizziness, no headaches, no numbness, no seizures, no syncope, no weakness, no tremors. Hematologic: No lymphadenopathy, no easy bruising. Psychiatric: No confusion, no hallucinations, no sleep disturbance.    Physical Exam: Filed Vitals:   06/09/13 0939  BP: 120/72  Pulse: 75   the general appearance reveals a well-developed well-nourished tall gentleman in no distress.  His weight is up 3 pounds since last visit.The head and neck exam reveals pupils equal and reactive.  Extraocular movements are full.  There is no scleral icterus.  The mouth and pharynx are normal.  The neck is supple.  The carotids reveal no bruits.  The jugular venous pressure is normal.  The  thyroid is not enlarged.  There is no lymphadenopathy.  The  chest is clear to percussion and auscultation.  There are no rales or rhonchi.  Expansion of the chest is symmetrical.  The precordium is quiet.  The first heart sound is normal.  The second heart sound is physiologically split.  There is no murmur gallop rub or click.  There is no abnormal lift or heave.  The abdomen is soft and nontender.  The bowel sounds are normal.  The liver and spleen are not enlarged.  There are no abdominal masses.  There are no abdominal bruits.  Extremities reveal good pedal pulses.  There is no phlebitis or edema.  There is no cyanosis or clubbing.  Strength is normal and symmetrical in all extremities.  There is no lateralizing weakness.  There are no sensory deficits.  The skin is warm and dry.  There is no rash.  EKG today shows normal sinus rhythm with sinus arrhythmia  and first degree AV block and is unchanged from 01/28/13   Assessment / Plan: Continue on same medication.  Recheck in 4 months for followup office visit CBC lipid panel hepatic function panel and basal metabolic panel

## 2013-06-09 NOTE — Assessment & Plan Note (Signed)
Blood pressure was remaining stable on current therapy.  No headaches.  No dizziness or syncope.

## 2013-06-09 NOTE — Progress Notes (Signed)
Quick Note:  Please report to patient. The recent labs are stable. Continue same medication and careful diet. ______ 

## 2013-06-09 NOTE — Patient Instructions (Signed)
Will obtain labs today and call you with the results (lp/gmet/hfp/cbc)  Your physician recommends that you continue on your current medications as directed. Please refer to the Current Medication list given to you today.  Your physician wants you to follow-up in: 4 months with fasting labs (lp/bmet/hfp/cbc)  You will receive a reminder letter in the mail two months in advance. If you don't receive a letter, please call our office to schedule the follow-up appointment.

## 2013-06-09 NOTE — Assessment & Plan Note (Signed)
The patient is doing well following mitral valve repair.  His recent echocardiogram showed good LV function and good mitral valve function.  He is not having any angina pectoris or exertional dyspnea.  He has been going to the [facility] for exercise.

## 2013-06-09 NOTE — Assessment & Plan Note (Signed)
Patient has a history of hypercholesterolemia and is on low-dose Lipitor.  No myalgias.  Blood work today pending.

## 2013-06-10 ENCOUNTER — Encounter (HOSPITAL_COMMUNITY): Payer: Medicare Other

## 2013-06-10 ENCOUNTER — Telehealth: Payer: Self-pay | Admitting: *Deleted

## 2013-06-10 NOTE — Telephone Encounter (Signed)
Message copied by Burnell Blanks on Fri Jun 10, 2013  8:35 AM ------      Message from: Cassell Clement      Created: Thu Jun 09, 2013  8:48 PM       Please report to patient.  The recent labs are stable. Continue same medication and careful diet. ------

## 2013-06-10 NOTE — Telephone Encounter (Signed)
Advised patient of lab results and mailed copy 

## 2013-06-13 ENCOUNTER — Encounter (HOSPITAL_COMMUNITY): Payer: Medicare Other

## 2013-06-15 ENCOUNTER — Encounter (HOSPITAL_COMMUNITY): Payer: Medicare Other

## 2013-07-05 ENCOUNTER — Other Ambulatory Visit: Payer: Self-pay

## 2013-07-05 DIAGNOSIS — I493 Ventricular premature depolarization: Secondary | ICD-10-CM

## 2013-07-05 MED ORDER — METOPROLOL SUCCINATE ER 50 MG PO TB24: 50.0000 mg | ORAL_TABLET | Freq: Every day | ORAL | Status: DC

## 2013-07-05 MED ORDER — ATORVASTATIN CALCIUM 10 MG PO TABS: 10.0000 mg | ORAL_TABLET | Freq: Every day | ORAL | Status: DC

## 2013-07-14 ENCOUNTER — Other Ambulatory Visit: Payer: Self-pay | Admitting: Dermatology

## 2013-08-11 ENCOUNTER — Telehealth: Payer: Self-pay | Admitting: Family Medicine

## 2013-08-11 NOTE — Telephone Encounter (Signed)
Patient is requesting that we call him when we get the flu shot in for the older patients. Thanks.

## 2013-08-17 ENCOUNTER — Telehealth: Payer: Self-pay | Admitting: Family Medicine

## 2013-08-17 MED ORDER — ZOSTER VACCINE LIVE 19400 UNT/0.65ML ~~LOC~~ SOLR: 0.6500 mL | Freq: Once | SUBCUTANEOUS | Status: DC

## 2013-08-17 NOTE — Telephone Encounter (Signed)
Patient is requesting that we send a prescription for the zostavax shot to [facility] in [place].

## 2013-08-18 NOTE — Telephone Encounter (Signed)
Pt informed and stated he would call back. He had just got his shingles shot yesterday

## 2013-09-30 ENCOUNTER — Ambulatory Visit (INDEPENDENT_AMBULATORY_CARE_PROVIDER_SITE_OTHER): Payer: Medicare Other | Admitting: Cardiology

## 2013-09-30 ENCOUNTER — Other Ambulatory Visit: Payer: Medicare Other

## 2013-09-30 ENCOUNTER — Encounter: Payer: Self-pay | Admitting: Cardiology

## 2013-09-30 VITALS — BP 108/72 | HR 64 | Ht 76.75 in | Wt 192.0 lb

## 2013-09-30 DIAGNOSIS — I10 Essential (primary) hypertension: Secondary | ICD-10-CM

## 2013-09-30 DIAGNOSIS — Z9889 Other specified postprocedural states: Secondary | ICD-10-CM

## 2013-09-30 DIAGNOSIS — E7800 Pure hypercholesterolemia, unspecified: Secondary | ICD-10-CM

## 2013-09-30 DIAGNOSIS — I38 Endocarditis, valve unspecified: Secondary | ICD-10-CM

## 2013-09-30 DIAGNOSIS — I4891 Unspecified atrial fibrillation: Secondary | ICD-10-CM

## 2013-09-30 LAB — CBC WITH DIFFERENTIAL/PLATELET
Basophils Relative: 0.4 % (ref 0.0–3.0)
Eosinophils Absolute: 0 10*3/uL (ref 0.0–0.7)
Hemoglobin: 15.8 g/dL (ref 13.0–17.0)
Lymphocytes Relative: 27.8 % (ref 12.0–46.0)
Lymphs Abs: 1.7 10*3/uL (ref 0.7–4.0)
MCHC: 34.2 g/dL (ref 30.0–36.0)
Monocytes Relative: 14.2 % — ABNORMAL HIGH (ref 3.0–12.0)
Neutro Abs: 3.5 10*3/uL (ref 1.4–7.7)
RBC: 5.11 Mil/uL (ref 4.22–5.81)
RDW: 13.1 % (ref 11.5–14.6)

## 2013-09-30 LAB — BASIC METABOLIC PANEL
BUN: 24 mg/dL — ABNORMAL HIGH (ref 6–23)
Chloride: 103 mEq/L (ref 96–112)
GFR: 82.99 mL/min (ref >60.00)
Potassium: 4.5 mEq/L (ref 3.5–5.1)
Sodium: 138 mEq/L (ref 135–145)

## 2013-09-30 LAB — LIPID PANEL
Cholesterol: 138 mg/dL (ref 0–200)
LDL Cholesterol: 82 mg/dL (ref 0–99)
Total CHOL/HDL Ratio: 3

## 2013-09-30 LAB — HEPATIC FUNCTION PANEL
ALT: 19 U/L (ref 0–53)
AST: 24 U/L (ref 0–37)
Alkaline Phosphatase: 34 U/L — ABNORMAL LOW (ref 39–117)
Total Bilirubin: 1.2 mg/dL (ref 0.3–1.2)

## 2013-09-30 NOTE — Assessment & Plan Note (Signed)
The patient is not having any symptoms related to his previously repaired mitral valve.  He denies any orthopnea or paroxysmal nocturnal dyspnea or symptoms of CHF.

## 2013-09-30 NOTE — Progress Notes (Signed)
[name] [name] Date of Birth:  13-Jun-1931 [address] [place], [place]  [address] [phone]         Fax   [phone]  History of Present Illness: This pleasant 79 year old gentleman is seen for a scheduled followup office visit. He has a past history of severe mitral valve prolapse. He underwent mitral valve repair by Dr. Cornelius Moras on 10/01/11 which was 2 years ago this very day.Marland Kitchen He did not have any coronary disease. He had a prolonged hospital course but eventually recovered and is now finished the cardiac rehabilitation program and is now exercising at the [facility]. His exercise however has been limited by his arthritis of both knees. Marland Kitchen He remains in normal sinus rhythm after being in atrial fibrillation postoperatively. He is no longer on warfarin but does take baby aspirin daily. He notes occasional skipped beats usually when it is time for him to take his nighttime dose of Lopressor. This has improved since we switched him to Toprol. Since her last saw him he has not had any more dermatologic surgeries for melanomas of his left shoulder and left forearm.  The patient underwent an echocardiogram 02/03/13 showing normal left ventricular systolic function with ejection fraction 55-60%.  He does not have any mitral regurgitation and he has mild mitral stenosis and the mitral valve repair looks good.   Current Outpatient Prescriptions  Medication Sig Dispense Refill  . acetaminophen (TYLENOL) 325 MG tablet Take 325 mg by mouth every 6 (six) hours as needed. For pain      . aspirin 81 MG tablet Take 81 mg by mouth daily.      Marland Kitchen atorvastatin (LIPITOR) 10 MG tablet Take 1 tablet (10 mg total) by mouth daily.  90 tablet  3  . celecoxib (CELEBREX) 200 MG capsule Take 200 mg by mouth daily.      . furosemide (LASIX) 40 MG tablet Take 1 tablet (40 mg total) by mouth daily.  90 tablet  3  . Glucosamine-Chondroitin (GLUCOSAMINE CHONDR COMPLEX PO) Take by mouth 3 (three) times  daily.      Marland Kitchen lactose free nutrition (BOOST) LIQD Take 1 Container by mouth daily.      . metoprolol succinate (TOPROL-XL) 50 MG 24 hr tablet Take 1 tablet (50 mg total) by mouth daily.  90 tablet  3  . Multiple Vitamins-Minerals (MULTIVITAMINS THER. W/MINERALS) TABS Take 1 tablet by mouth daily.  30 each    . potassium chloride SA (K-DUR,KLOR-CON) 20 MEQ tablet Take 1 tablet (20 mEq total) by mouth 3 (three) times daily.  270 tablet  3  . RESTASIS 0.05 % ophthalmic emulsion Place 1 drop into both eyes every 12 (twelve) hours.       . traZODone (DESYREL) 100 MG tablet Take 300 mg by mouth at bedtime.       . vitamin C (ASCORBIC ACID) 500 MG tablet Take 500 mg by mouth 2 (two) times daily.      Marland Kitchen zoster vaccine live, PF, (ZOSTAVAX) 01027 UNT/0.65ML injection Inject 19,400 Units into the skin once.  1 each  0  . [DISCONTINUED] pantoprazole (PROTONIX) 40 MG tablet Take 1 tablet (40 mg total) by mouth daily before breakfast.       No current facility-administered medications for this visit.    No Known Allergies  Patient Active Problem List   Diagnosis Date Noted  . HYPERCHOLESTEROLEMIA 06/09/2008    Priority: High  . OSTEOARTHRITIS, GENERALIZED, MULTIPLE JOINTS 06/09/2008  Priority: Medium  . Internal nasal lesion 05/15/2013  . Low back pain 04/19/2013  . Melanoma 09/30/2012  . Cough 03/26/2012  . Hx of mitral valve repair 11/19/2011  . Encounter for long-term (current) use of anticoagulants 11/03/2011  . Decubitus skin ulcer 10/28/2011  . Pleural effusion due to congestive heart failure 10/28/2011  . Atrial fibrillation 10/07/2011  . S/P mitral valve repair 10/01/2011  . Valvular heart disease 08/21/2011  . Cerumen impaction 04/09/2011  . Hearing loss 04/09/2011  . THROMBOCYTOPENIA 09/19/2010  . ADENOCARCINOMA, PROSTATE 09/17/2010  . CALLUS, LEFT FOOT 09/17/2010  . BACK PAIN, CHRONIC 09/17/2010  . TINEA PEDIS 05/28/2009  . DERMATITIS, ATOPIC 04/10/2009  . DEPRESSION  06/09/2008  . HYPERTENSION, BENIGN ESSENTIAL 06/09/2008  . GERD 06/09/2008  . MUSCLE SPASM, BACK 06/09/2008  . PERSONAL HISTORY MALIGNANT NEOPLASM PROSTATE 06/09/2008  . PERSONAL HISTORY OF MALIGNANT MELANOMA OF SKIN 06/09/2008  . ARRHYTHMIA, HX OF 06/09/2008  . Personal history of colonic polyps 06/09/2008    History  Smoking status  . Never Smoker   Smokeless tobacco  . Never Used    History  Alcohol Use No    Family History  Problem Relation Age of Onset  . Clotting disorder Brother     CVA's  . Arthritis Mother   . Hypertension Mother   . Stroke Mother   . Hypertension Father   . Psychosis Father     psychiatric care  . Colon cancer Neg Hx   . Stomach cancer Neg Hx     Review of Systems: Constitutional: no fever chills diaphoresis or fatigue or change in weight.  Head and neck: no hearing loss, no epistaxis, no photophobia or visual disturbance. Respiratory: No cough, shortness of breath or wheezing. Cardiovascular: No chest pain peripheral edema, palpitations. Gastrointestinal: No abdominal distention, no abdominal pain, no change in bowel habits hematochezia or melena. Genitourinary: No dysuria, no frequency, no urgency, no nocturia. Musculoskeletal:No arthralgias, no back pain, no gait disturbance or myalgias. Neurological: No dizziness, no headaches, no numbness, no seizures, no syncope, no weakness, no tremors. Hematologic: No lymphadenopathy, no easy bruising. Psychiatric: No confusion, no hallucinations, no sleep disturbance.    Physical Exam: Filed Vitals:   09/30/13 1027  BP: 108/72  Pulse: 64   the general appearance reveals a well-developed well-nourished tall gentleman in no distress.  His weight is up 3 pounds since last visit.The head and neck exam reveals pupils equal and reactive.  Extraocular movements are full.  There is no scleral icterus.  The mouth and pharynx are normal.  The neck is supple.  The carotids reveal no bruits.  The jugular  venous pressure is normal.  The  thyroid is not enlarged.  There is no lymphadenopathy.  The chest is clear to percussion and auscultation.  There are no rales or rhonchi.  Expansion of the chest is symmetrical.  The precordium is quiet.  The first heart sound is normal.  The second heart sound is physiologically split.  There is no murmur gallop rub or click.  There is no abnormal lift or heave.  The abdomen is soft and nontender.  The bowel sounds are normal.  The liver and spleen are not enlarged.  There are no abdominal masses.  There are no abdominal bruits.  Extremities reveal good pedal pulses.  There is no phlebitis or edema.  There is no cyanosis or clubbing.  Strength is normal and symmetrical in all extremities.  There is no lateralizing weakness.  There are no  sensory deficits.  The skin is warm and dry.  There is no rash.  EKG today shows normal sinus rhythm with sinus arrhythmia and first degree AV block and is unchanged from prior tracings   Assessment / Plan: Continue on same medication.  Recheck in 4 months for followup office visit EKG lipid panel hepatic function panel and basal metabolic panel

## 2013-09-30 NOTE — Assessment & Plan Note (Signed)
The patient has not had any recurrence of atrial fibrillation.  He does have occasional premature atrial beats

## 2013-09-30 NOTE — Assessment & Plan Note (Signed)
Blood pressure was remaining stable on current therapy.  No dizziness or syncope.  No headaches 

## 2013-09-30 NOTE — Patient Instructions (Signed)
Your physician recommends that you have lab work today: CBC, LIVER, LIPID and BMP  Your physician recommends that you continue on your current medications as directed. Please refer to the Current Medication list given to you today.  Your physician recommends that you schedule a follow-up appointment in: 4 MONTHS with Dr Patty Sermons  Your physician recommends that you return for lab work in: 4 MONTHS (LIVER, LIPID and BMP)

## 2013-10-01 NOTE — Progress Notes (Signed)
Quick Note:  Please report to patient. The recent labs are stable. Continue same medication and careful diet. ______ 

## 2013-10-04 ENCOUNTER — Telehealth: Payer: Self-pay | Admitting: Cardiology

## 2013-10-04 NOTE — Telephone Encounter (Signed)
Follow Up   Pt returned calls for results// please call

## 2013-10-04 NOTE — Telephone Encounter (Signed)
Advised patient of lab results  

## 2013-10-04 NOTE — Telephone Encounter (Signed)
Message copied by Burnell Blanks on Tue Oct 04, 2013  6:30 PM ------      Message from: Cassell Clement      Created: Sat Oct 01, 2013  6:49 PM       Please report to patient.  The recent labs are stable. Continue same medication and careful diet. ------

## 2013-11-14 ENCOUNTER — Other Ambulatory Visit: Payer: Self-pay | Admitting: Dermatology

## 2014-01-27 ENCOUNTER — Encounter: Payer: Self-pay | Admitting: Cardiology

## 2014-01-27 ENCOUNTER — Other Ambulatory Visit: Payer: Medicare Other

## 2014-01-27 ENCOUNTER — Ambulatory Visit (INDEPENDENT_AMBULATORY_CARE_PROVIDER_SITE_OTHER): Payer: Medicare Other | Admitting: Cardiology

## 2014-01-27 VITALS — BP 127/77 | HR 76 | Ht 76.75 in | Wt 194.0 lb

## 2014-01-27 DIAGNOSIS — Z9889 Other specified postprocedural states: Secondary | ICD-10-CM

## 2014-01-27 DIAGNOSIS — I10 Essential (primary) hypertension: Secondary | ICD-10-CM

## 2014-01-27 DIAGNOSIS — E7800 Pure hypercholesterolemia, unspecified: Secondary | ICD-10-CM

## 2014-01-27 DIAGNOSIS — D696 Thrombocytopenia, unspecified: Secondary | ICD-10-CM

## 2014-01-27 DIAGNOSIS — I4891 Unspecified atrial fibrillation: Secondary | ICD-10-CM

## 2014-01-27 LAB — BASIC METABOLIC PANEL
BUN: 23 mg/dL (ref 6–23)
CO2: 29 mEq/L (ref 19–32)
Calcium: 9.7 mg/dL (ref 8.4–10.5)
Chloride: 103 mEq/L (ref 96–112)
Creatinine, Ser: 1 mg/dL (ref 0.4–1.5)
GFR: 79.93 mL/min (ref >60.00)
Glucose, Bld: 101 mg/dL — ABNORMAL HIGH (ref 70–99)
POTASSIUM: 4.6 meq/L (ref 3.5–5.1)
SODIUM: 138 meq/L (ref 135–145)

## 2014-01-27 LAB — CBC WITH DIFFERENTIAL/PLATELET
BASOS ABS: 0 10*3/uL (ref 0.0–0.1)
Basophils Relative: 0.4 % (ref 0.0–3.0)
Eosinophils Absolute: 0 10*3/uL (ref 0.0–0.7)
Eosinophils Relative: 0.4 % (ref 0.0–5.0)
HCT: 47.3 % (ref 39.0–52.0)
Hemoglobin: 16 g/dL (ref 13.0–17.0)
Lymphocytes Relative: 22.3 % (ref 12.0–46.0)
Lymphs Abs: 1.3 10*3/uL (ref 0.7–4.0)
MCHC: 33.8 g/dL (ref 30.0–36.0)
MCV: 91.8 fl (ref 78.0–100.0)
MONOS PCT: 14 % — AB (ref 3.0–12.0)
Monocytes Absolute: 0.8 10*3/uL (ref 0.1–1.0)
NEUTROS PCT: 62.9 % (ref 43.0–77.0)
Neutro Abs: 3.6 10*3/uL (ref 1.4–7.7)
PLATELETS: 78 10*3/uL — AB (ref 150.0–400.0)
RBC: 5.15 Mil/uL (ref 4.22–5.81)
RDW: 12.8 % (ref 11.5–14.6)
WBC: 5.7 10*3/uL (ref 4.5–10.5)

## 2014-01-27 LAB — HEPATIC FUNCTION PANEL
ALT: 21 U/L (ref 0–53)
AST: 22 U/L (ref 0–37)
Albumin: 4.3 g/dL (ref 3.5–5.2)
Alkaline Phosphatase: 34 U/L — ABNORMAL LOW (ref 39–117)
Bilirubin, Direct: 0.2 mg/dL (ref 0.0–0.3)
TOTAL PROTEIN: 6.9 g/dL (ref 6.0–8.3)
Total Bilirubin: 1.1 mg/dL (ref 0.3–1.2)

## 2014-01-27 LAB — LIPID PANEL
CHOLESTEROL: 135 mg/dL (ref 0–200)
HDL: 44.5 mg/dL (ref >39.00)
LDL Cholesterol: 79 mg/dL (ref 0–99)
Total CHOL/HDL Ratio: 3
Triglycerides: 58 mg/dL (ref 0.0–149.0)
VLDL: 11.6 mg/dL (ref 0.0–40.0)

## 2014-01-27 NOTE — Assessment & Plan Note (Signed)
Blood pressure was remaining stable on current medication.  No headaches dizziness or syncope.

## 2014-01-27 NOTE — Assessment & Plan Note (Signed)
The patient has had no recurrence of his atrial fibrillation.  Today on the auscultation his heart appeared to be slightly irregular but EKG shows that it is just PACs.  He was no longer having PVCs.

## 2014-01-27 NOTE — Assessment & Plan Note (Signed)
The patient has a history of hypercholesterolemia.  We are checking blood work today including lipid panel.  The patient is having some weakness in his legs but no myalgias.  He has not been going to the gym much and he may be getting more out of shape.  I encouraged him to go back to the gym on a more regular basis to build up his leg strength.

## 2014-01-27 NOTE — Patient Instructions (Signed)
Will obtain labs today and call you with the results ([email or web address])  Your physician recommends that you continue on your current medications as directed. Please refer to the Current Medication list given to you today.  Your physician wants you to follow-up in: 4 months with fasting labs (lp/bmet/hfp/cbc)  You will receive a reminder letter in the mail two months in advance. If you don't receive a letter, please call our office to schedule the follow-up appointment.

## 2014-01-27 NOTE — Progress Notes (Signed)
[name] [name] Date of Birth:  12/29/1930 [address] [place], [place]  [address] [phone]         Fax   [phone]  History of Present Illness: This pleasant 80 year old gentleman is seen for a scheduled followup office visit. He has a past history of severe mitral valve prolapse. He underwent mitral valve repair by Dr. Roxy Manns on 10/01/11 which was 2 years ago this very day.Marland Kitchen He did not have any coronary disease. He had a prolonged hospital course but eventually recovered and is now finished the cardiac rehabilitation program and is now exercising at the [facility]. His exercise however has been limited by his arthritis of both knees. Marland Kitchen He remains in normal sinus rhythm after being in atrial fibrillation postoperatively. He is no longer on warfarin but does take baby aspirin daily. He notes occasional skipped beats usually when it is time for him to take his nighttime dose of Lopressor. This has improved since we switched him to Toprol. Since her last saw him he has not had any more dermatologic surgeries for melanomas of his left shoulder and left forearm.  The patient underwent an echocardiogram 02/03/13 showing normal left ventricular systolic function with ejection fraction 55-60%.  He does not have any mitral regurgitation and he has mild mitral stenosis and the mitral valve repair looks good.   Current Outpatient Prescriptions  Medication Sig Dispense Refill  . acetaminophen (TYLENOL) 325 MG tablet Take 325 mg by mouth every 6 (six) hours as needed. For pain      . aspirin 81 MG tablet Take 81 mg by mouth daily.      Marland Kitchen atorvastatin (LIPITOR) 10 MG tablet Take 1 tablet (10 mg total) by mouth daily.  90 tablet  3  . celecoxib (CELEBREX) 200 MG capsule Take 200 mg by mouth daily.      . furosemide (LASIX) 40 MG tablet Take 1 tablet (40 mg total) by mouth daily.  90 tablet  3  . Glucosamine-Chondroitin (GLUCOSAMINE CHONDR COMPLEX PO) Take by mouth 3 (three) times  daily.      Marland Kitchen lactose free nutrition (BOOST) LIQD Take 1 Container by mouth daily.      . metoprolol succinate (TOPROL-XL) 50 MG 24 hr tablet Take 1 tablet (50 mg total) by mouth daily.  90 tablet  3  . Multiple Vitamins-Minerals (MULTIVITAMINS THER. W/MINERALS) TABS Take 1 tablet by mouth daily.  30 each    . potassium chloride SA (K-DUR,KLOR-CON) 20 MEQ tablet Take 1 tablet (20 mEq total) by mouth 3 (three) times daily.  270 tablet  3  . RESTASIS 0.05 % ophthalmic emulsion Place 1 drop into both eyes every 12 (twelve) hours.       . traZODone (DESYREL) 100 MG tablet Take 300 mg by mouth at bedtime.       . vitamin C (ASCORBIC ACID) 500 MG tablet Take 500 mg by mouth 2 (two) times daily.      Marland Kitchen zoster vaccine live, PF, (ZOSTAVAX) 13244 UNT/0.65ML injection Inject 19,400 Units into the skin once.  1 each  0  . [DISCONTINUED] pantoprazole (PROTONIX) 40 MG tablet Take 1 tablet (40 mg total) by mouth daily before breakfast.       No current facility-administered medications for this visit.    No Known Allergies  Patient Active Problem List   Diagnosis Date Noted  . HYPERCHOLESTEROLEMIA 06/09/2008    Priority: High  . OSTEOARTHRITIS, GENERALIZED, MULTIPLE JOINTS 06/09/2008  Priority: Medium  . Internal nasal lesion 05/15/2013  . Low back pain 04/19/2013  . Melanoma 09/30/2012  . Cough 03/26/2012  . Hx of mitral valve repair 11/19/2011  . Encounter for long-term (current) use of anticoagulants 11/03/2011  . Decubitus skin ulcer 10/28/2011  . Pleural effusion due to congestive heart failure 10/28/2011  . Atrial fibrillation 10/07/2011  . S/P mitral valve repair 10/01/2011  . Valvular heart disease 08/21/2011  . Cerumen impaction 04/09/2011  . Hearing loss 04/09/2011  . THROMBOCYTOPENIA 09/19/2010  . ADENOCARCINOMA, PROSTATE 09/17/2010  . CALLUS, LEFT FOOT 09/17/2010  . BACK PAIN, CHRONIC 09/17/2010  . TINEA PEDIS 05/28/2009  . DERMATITIS, ATOPIC 04/10/2009  . DEPRESSION  06/09/2008  . HYPERTENSION, BENIGN ESSENTIAL 06/09/2008  . GERD 06/09/2008  . MUSCLE SPASM, BACK 06/09/2008  . PERSONAL HISTORY MALIGNANT NEOPLASM PROSTATE 06/09/2008  . PERSONAL HISTORY OF MALIGNANT MELANOMA OF SKIN 06/09/2008  . ARRHYTHMIA, HX OF 06/09/2008  . Personal history of colonic polyps 06/09/2008    History  Smoking status  . Never Smoker   Smokeless tobacco  . Never Used    History  Alcohol Use No    Family History  Problem Relation Age of Onset  . Clotting disorder Brother     CVA's  . Arthritis Mother   . Hypertension Mother   . Stroke Mother   . Hypertension Father   . Psychosis Father     psychiatric care  . Colon cancer Neg Hx   . Stomach cancer Neg Hx     Review of Systems: Constitutional: no fever chills diaphoresis or fatigue or change in weight.  Head and neck: no hearing loss, no epistaxis, no photophobia or visual disturbance. Respiratory: No cough, shortness of breath or wheezing. Cardiovascular: No chest pain peripheral edema, palpitations. Gastrointestinal: No abdominal distention, no abdominal pain, no change in bowel habits hematochezia or melena. Genitourinary: No dysuria, no frequency, no urgency, no nocturia. Musculoskeletal:No arthralgias, no back pain, no gait disturbance or myalgias. Neurological: No dizziness, no headaches, no numbness, no seizures, no syncope, no weakness, no tremors. Hematologic: No lymphadenopathy, no easy bruising. Psychiatric: No confusion, no hallucinations, no sleep disturbance.    Physical Exam: Filed Vitals:   01/27/14 0957  BP: 127/77  Pulse: 76   the general appearance reveals a well-developed well-nourished tall gentleman in no distress.  His weight is up 3 pounds since last visit.The head and neck exam reveals pupils equal and reactive.  Extraocular movements are full.  There is no scleral icterus.  The mouth and pharynx are normal.  The neck is supple.  The carotids reveal no bruits.  The jugular  venous pressure is normal.  The  thyroid is not enlarged.  There is no lymphadenopathy.  The chest is clear to percussion and auscultation.  There are no rales or rhonchi.  Expansion of the chest is symmetrical.  The precordium is quiet.  The first heart sound is normal.  The second heart sound is physiologically split.  There is no murmur gallop rub or click.  There is no abnormal lift or heave.  The abdomen is soft and nontender.  The bowel sounds are normal.  The liver and spleen are not enlarged.  There are no abdominal masses.  There are no abdominal bruits.  Extremities reveal good pedal pulses.  There is no phlebitis or edema.  There is no cyanosis or clubbing.  Strength is normal and symmetrical in all extremities.  There is no lateralizing weakness.  There are no  sensory deficits.  The skin is warm and dry.  There is no rash.  EKG today shows normal sinus rhythm with sinus arrhythmia and first degree AV block and is unchanged from prior tracings.  There are rare PACs   Assessment / Plan: Continue on same medication.  Recheck in 4 months for followup office visit  lipid panel hepatic function panel and basal metabolic panel and CBC.  He has a past history of thrombocytopenia.  He has a history of malignant melanoma with no recent recurrence.

## 2014-01-28 NOTE — Progress Notes (Signed)
Quick Note:  Please report to patient. The recent labs are stable. Continue same medication and careful diet. ______ 

## 2014-01-31 ENCOUNTER — Telehealth: Payer: Self-pay | Admitting: *Deleted

## 2014-01-31 NOTE — Telephone Encounter (Signed)
Message copied by Earvin Hansen on Tue Jan 31, 2014  9:34 AM ------      Message from: Darlin Coco      Created: Sat Jan 28, 2014  3:31 PM       Please report to patient.  The recent labs are stable. Continue same medication and careful diet. ------

## 2014-01-31 NOTE — Telephone Encounter (Signed)
Advised patient of lab results and mailed copy at patient request

## 2014-02-03 ENCOUNTER — Other Ambulatory Visit: Payer: Self-pay | Admitting: *Deleted

## 2014-02-03 ENCOUNTER — Telehealth: Payer: Self-pay | Admitting: Cardiology

## 2014-02-03 MED ORDER — FUROSEMIDE 40 MG PO TABS: 40.0000 mg | ORAL_TABLET | Freq: Every day | ORAL | Status: DC

## 2014-02-06 ENCOUNTER — Encounter: Payer: Self-pay | Admitting: Cardiology

## 2014-02-07 ENCOUNTER — Encounter: Payer: Self-pay | Admitting: Cardiology

## 2014-02-27 ENCOUNTER — Other Ambulatory Visit: Payer: Self-pay | Admitting: *Deleted

## 2014-02-27 MED ORDER — POTASSIUM CHLORIDE CRYS ER 20 MEQ PO TBCR: 20.0000 meq | EXTENDED_RELEASE_TABLET | Freq: Three times a day (TID) | ORAL | Status: DC

## 2014-04-02 ENCOUNTER — Encounter (HOSPITAL_COMMUNITY): Payer: Self-pay | Admitting: Emergency Medicine

## 2014-04-02 ENCOUNTER — Inpatient Hospital Stay (HOSPITAL_COMMUNITY)
Admission: EM | Admit: 2014-04-02 | Discharge: 2014-04-06 | DRG: 683 | Disposition: A | Discharge status: Home Health | Payer: Medicare Other | Attending: Internal Medicine | Admitting: Internal Medicine

## 2014-04-02 ENCOUNTER — Emergency Department (HOSPITAL_COMMUNITY): Payer: Medicare Other

## 2014-04-02 DIAGNOSIS — Z85820 Personal history of malignant melanoma of skin: Secondary | ICD-10-CM

## 2014-04-02 DIAGNOSIS — N179 Acute kidney failure, unspecified: Principal | ICD-10-CM | POA: Diagnosis present

## 2014-04-02 DIAGNOSIS — M159 Polyosteoarthritis, unspecified: Secondary | ICD-10-CM

## 2014-04-02 DIAGNOSIS — S1093XA Contusion of unspecified part of neck, initial encounter: Secondary | ICD-10-CM

## 2014-04-02 DIAGNOSIS — C439 Malignant melanoma of skin, unspecified: Secondary | ICD-10-CM

## 2014-04-02 DIAGNOSIS — I10 Essential (primary) hypertension: Secondary | ICD-10-CM

## 2014-04-02 DIAGNOSIS — E875 Hyperkalemia: Secondary | ICD-10-CM | POA: Diagnosis present

## 2014-04-02 DIAGNOSIS — I509 Heart failure, unspecified: Secondary | ICD-10-CM

## 2014-04-02 DIAGNOSIS — F329 Major depressive disorder, single episode, unspecified: Secondary | ICD-10-CM

## 2014-04-02 DIAGNOSIS — M549 Dorsalgia, unspecified: Secondary | ICD-10-CM

## 2014-04-02 DIAGNOSIS — N390 Urinary tract infection, site not specified: Secondary | ICD-10-CM | POA: Diagnosis present

## 2014-04-02 DIAGNOSIS — Z9889 Other specified postprocedural states: Secondary | ICD-10-CM

## 2014-04-02 DIAGNOSIS — Z7901 Long term (current) use of anticoagulants: Secondary | ICD-10-CM

## 2014-04-02 DIAGNOSIS — R55 Syncope and collapse: Secondary | ICD-10-CM

## 2014-04-02 DIAGNOSIS — W06XXXA Fall from bed, initial encounter: Secondary | ICD-10-CM | POA: Diagnosis present

## 2014-04-02 DIAGNOSIS — L84 Corns and callosities: Secondary | ICD-10-CM

## 2014-04-02 DIAGNOSIS — D72829 Elevated white blood cell count, unspecified: Secondary | ICD-10-CM

## 2014-04-02 DIAGNOSIS — Z8546 Personal history of malignant neoplasm of prostate: Secondary | ICD-10-CM

## 2014-04-02 DIAGNOSIS — J3489 Other specified disorders of nose and nasal sinuses: Secondary | ICD-10-CM

## 2014-04-02 DIAGNOSIS — M5380 Other specified dorsopathies, site unspecified: Secondary | ICD-10-CM

## 2014-04-02 DIAGNOSIS — R7309 Other abnormal glucose: Secondary | ICD-10-CM | POA: Diagnosis not present

## 2014-04-02 DIAGNOSIS — R05 Cough: Secondary | ICD-10-CM

## 2014-04-02 DIAGNOSIS — Z791 Long term (current) use of non-steroidal anti-inflammatories (NSAID): Secondary | ICD-10-CM

## 2014-04-02 DIAGNOSIS — S0003XA Contusion of scalp, initial encounter: Secondary | ICD-10-CM | POA: Diagnosis present

## 2014-04-02 DIAGNOSIS — E7800 Pure hypercholesterolemia, unspecified: Secondary | ICD-10-CM

## 2014-04-02 DIAGNOSIS — H6120 Impacted cerumen, unspecified ear: Secondary | ICD-10-CM

## 2014-04-02 DIAGNOSIS — M545 Low back pain, unspecified: Secondary | ICD-10-CM

## 2014-04-02 DIAGNOSIS — Z7982 Long term (current) use of aspirin: Secondary | ICD-10-CM

## 2014-04-02 DIAGNOSIS — G8929 Other chronic pain: Secondary | ICD-10-CM | POA: Diagnosis present

## 2014-04-02 DIAGNOSIS — B353 Tinea pedis: Secondary | ICD-10-CM

## 2014-04-02 DIAGNOSIS — M1990 Unspecified osteoarthritis, unspecified site: Secondary | ICD-10-CM | POA: Diagnosis present

## 2014-04-02 DIAGNOSIS — L8990 Pressure ulcer of unspecified site, unspecified stage: Secondary | ICD-10-CM

## 2014-04-02 DIAGNOSIS — I951 Orthostatic hypotension: Secondary | ICD-10-CM | POA: Diagnosis present

## 2014-04-02 DIAGNOSIS — M6282 Rhabdomyolysis: Secondary | ICD-10-CM | POA: Diagnosis present

## 2014-04-02 DIAGNOSIS — I4891 Unspecified atrial fibrillation: Secondary | ICD-10-CM | POA: Diagnosis not present

## 2014-04-02 DIAGNOSIS — Z954 Presence of other heart-valve replacement: Secondary | ICD-10-CM

## 2014-04-02 DIAGNOSIS — Z8679 Personal history of other diseases of the circulatory system: Secondary | ICD-10-CM

## 2014-04-02 DIAGNOSIS — F3289 Other specified depressive episodes: Secondary | ICD-10-CM

## 2014-04-02 DIAGNOSIS — K219 Gastro-esophageal reflux disease without esophagitis: Secondary | ICD-10-CM | POA: Diagnosis present

## 2014-04-02 DIAGNOSIS — A498 Other bacterial infections of unspecified site: Secondary | ICD-10-CM | POA: Diagnosis present

## 2014-04-02 DIAGNOSIS — C61 Malignant neoplasm of prostate: Secondary | ICD-10-CM

## 2014-04-02 DIAGNOSIS — L2089 Other atopic dermatitis: Secondary | ICD-10-CM

## 2014-04-02 DIAGNOSIS — S0083XA Contusion of other part of head, initial encounter: Secondary | ICD-10-CM | POA: Diagnosis present

## 2014-04-02 DIAGNOSIS — I38 Endocarditis, valve unspecified: Secondary | ICD-10-CM

## 2014-04-02 DIAGNOSIS — Z86010 Personal history of colonic polyps: Secondary | ICD-10-CM

## 2014-04-02 DIAGNOSIS — E860 Dehydration: Secondary | ICD-10-CM | POA: Diagnosis present

## 2014-04-02 DIAGNOSIS — R059 Cough, unspecified: Secondary | ICD-10-CM

## 2014-04-02 DIAGNOSIS — D696 Thrombocytopenia, unspecified: Secondary | ICD-10-CM | POA: Diagnosis present

## 2014-04-02 DIAGNOSIS — Y92009 Unspecified place in unspecified non-institutional (private) residence as the place of occurrence of the external cause: Secondary | ICD-10-CM

## 2014-04-02 HISTORY — DX: Paroxysmal atrial fibrillation: I48.0

## 2014-04-02 HISTORY — DX: Encounter for observation for other suspected diseases and conditions ruled out: Z03.89

## 2014-04-02 HISTORY — DX: Reserved for inherently not codable concepts without codable children: IMO0001

## 2014-04-02 HISTORY — DX: Pulmonary hypertension, unspecified: I27.20

## 2014-04-02 LAB — BASIC METABOLIC PANEL
BUN: 51 mg/dL — ABNORMAL HIGH (ref 6–23)
CALCIUM: 9.6 mg/dL (ref 8.4–10.5)
CHLORIDE: 102 meq/L (ref 96–112)
CO2: 23 meq/L (ref 19–32)
Creatinine, Ser: 2.12 mg/dL — ABNORMAL HIGH (ref 0.50–1.35)
GFR calc Af Amer: 32 mL/min — ABNORMAL LOW (ref >90)
GFR calc non Af Amer: 28 mL/min — ABNORMAL LOW (ref >90)
GLUCOSE: 111 mg/dL — AB (ref 70–99)
Potassium: 5.3 mEq/L (ref 3.7–5.3)
SODIUM: 139 meq/L (ref 137–147)

## 2014-04-02 LAB — CBC
HCT: 44.5 % (ref 39.0–52.0)
HEMOGLOBIN: 15.6 g/dL (ref 13.0–17.0)
MCH: 31.7 pg (ref 26.0–34.0)
MCHC: 35.1 g/dL (ref 30.0–36.0)
MCV: 90.4 fL (ref 78.0–100.0)
Platelets: 54 10*3/uL — ABNORMAL LOW (ref 150–400)
RBC: 4.92 MIL/uL (ref 4.22–5.81)
RDW: 13.3 % (ref 11.5–15.5)
WBC: 25.9 10*3/uL — AB (ref 4.0–10.5)

## 2014-04-02 MED ORDER — SODIUM CHLORIDE 0.9 % IV SOLN
INTRAVENOUS | Status: DC
  Administered 2014-04-03: via INTRAVENOUS

## 2014-04-02 NOTE — ED Notes (Signed)
Pt reports last night to find himself laying on the ground. Pt states "I don't know what happened". States that today "I just don't feel right" and has intermittent episodes of "sweating." Denies chest pain, SOB, blurred vision, dizziness. Neuro intact. Pt ambulatory to triage. Minor scraps noted to top of head. AO x4.

## 2014-04-02 NOTE — ED Notes (Signed)
MD Marnette Burgess at bedside.

## 2014-04-03 ENCOUNTER — Emergency Department (HOSPITAL_COMMUNITY): Payer: Medicare Other

## 2014-04-03 ENCOUNTER — Encounter (HOSPITAL_COMMUNITY): Payer: Self-pay | Admitting: Internal Medicine

## 2014-04-03 DIAGNOSIS — D72829 Elevated white blood cell count, unspecified: Secondary | ICD-10-CM | POA: Diagnosis present

## 2014-04-03 DIAGNOSIS — N179 Acute kidney failure, unspecified: Principal | ICD-10-CM

## 2014-04-03 DIAGNOSIS — I38 Endocarditis, valve unspecified: Secondary | ICD-10-CM

## 2014-04-03 DIAGNOSIS — N390 Urinary tract infection, site not specified: Secondary | ICD-10-CM | POA: Diagnosis present

## 2014-04-03 DIAGNOSIS — M6282 Rhabdomyolysis: Secondary | ICD-10-CM | POA: Diagnosis present

## 2014-04-03 DIAGNOSIS — I059 Rheumatic mitral valve disease, unspecified: Secondary | ICD-10-CM

## 2014-04-03 DIAGNOSIS — R55 Syncope and collapse: Secondary | ICD-10-CM | POA: Diagnosis present

## 2014-04-03 DIAGNOSIS — I4891 Unspecified atrial fibrillation: Secondary | ICD-10-CM

## 2014-04-03 DIAGNOSIS — I10 Essential (primary) hypertension: Secondary | ICD-10-CM

## 2014-04-03 DIAGNOSIS — Z9889 Other specified postprocedural states: Secondary | ICD-10-CM

## 2014-04-03 LAB — CBC
HCT: 43.2 % (ref 39.0–52.0)
Hemoglobin: 14.9 g/dL (ref 13.0–17.0)
MCH: 31.2 pg (ref 26.0–34.0)
MCHC: 34.5 g/dL (ref 30.0–36.0)
MCV: 90.6 fL (ref 78.0–100.0)
Platelets: 50 10*3/uL — ABNORMAL LOW (ref 150–400)
RBC: 4.77 MIL/uL (ref 4.22–5.81)
RDW: 13.4 % (ref 11.5–15.5)
WBC: 20.3 10*3/uL — ABNORMAL HIGH (ref 4.0–10.5)

## 2014-04-03 LAB — BASIC METABOLIC PANEL
BUN: 50 mg/dL — ABNORMAL HIGH (ref 6–23)
CALCIUM: 9.2 mg/dL (ref 8.4–10.5)
CO2: 23 mEq/L (ref 19–32)
CREATININE: 1.82 mg/dL — AB (ref 0.50–1.35)
Chloride: 101 mEq/L (ref 96–112)
GFR calc Af Amer: 38 mL/min — ABNORMAL LOW (ref >90)
GFR calc non Af Amer: 33 mL/min — ABNORMAL LOW (ref >90)
GLUCOSE: 187 mg/dL — AB (ref 70–99)
Potassium: 5.9 mEq/L — ABNORMAL HIGH (ref 3.7–5.3)
Sodium: 136 mEq/L — ABNORMAL LOW (ref 137–147)

## 2014-04-03 LAB — POTASSIUM: Potassium: 5.8 mEq/L — ABNORMAL HIGH (ref 3.7–5.3)

## 2014-04-03 LAB — CK TOTAL AND CKMB (NOT AT ARMC)
CK, MB: 216.2 ng/mL (ref 0.3–4.0)
RELATIVE INDEX: 1.5 (ref 0.0–2.5)
Total CK: 14873 U/L — ABNORMAL HIGH (ref 7–232)

## 2014-04-03 LAB — URINALYSIS, ROUTINE W REFLEX MICROSCOPIC
BILIRUBIN URINE: NEGATIVE
Glucose, UA: NEGATIVE mg/dL
Ketones, ur: 15 mg/dL — AB
NITRITE: POSITIVE — AB
PH: 5 (ref 5.0–8.0)
Protein, ur: 100 mg/dL — AB
SPECIFIC GRAVITY, URINE: 1.021 (ref 1.005–1.030)
UROBILINOGEN UA: 1 mg/dL (ref 0.0–1.0)

## 2014-04-03 LAB — URINE MICROSCOPIC-ADD ON

## 2014-04-03 LAB — I-STAT TROPONIN, ED: TROPONIN I, POC: 0.06 ng/mL (ref 0.00–0.08)

## 2014-04-03 LAB — CK: Total CK: 9538 U/L — ABNORMAL HIGH (ref 7–232)

## 2014-04-03 LAB — TROPONIN I: Troponin I: <0.3 ng/mL (ref <0.30)

## 2014-04-03 LAB — HEPARIN LEVEL (UNFRACTIONATED): Heparin Unfractionated: <0.1 IU/mL — ABNORMAL LOW (ref 0.30–0.70)

## 2014-04-03 LAB — TSH: TSH: 1.1 u[IU]/mL (ref 0.350–4.500)

## 2014-04-03 LAB — HEMOGLOBIN A1C
Hgb A1c MFr Bld: 6 % — ABNORMAL HIGH (ref <5.7)
Mean Plasma Glucose: 126 mg/dL — ABNORMAL HIGH (ref <117)

## 2014-04-03 LAB — T4, FREE: Free T4: 1.12 ng/dL (ref 0.80–1.80)

## 2014-04-03 LAB — I-STAT CG4 LACTIC ACID, ED: LACTIC ACID, VENOUS: 2.16 mmol/L (ref 0.5–2.2)

## 2014-04-03 MED ORDER — ASPIRIN EC 81 MG PO TBEC
81.0000 mg | DELAYED_RELEASE_TABLET | Freq: Every day | ORAL | Status: DC
  Administered 2014-04-03: 81 mg via ORAL
  Filled 2014-04-03: qty 1

## 2014-04-03 MED ORDER — ONDANSETRON HCL 4 MG PO TABS: 4.0000 mg | ORAL_TABLET | Freq: Four times a day (QID) | ORAL | Status: DC | PRN

## 2014-04-03 MED ORDER — HEPARIN BOLUS VIA INFUSION
4000.0000 [IU] | Freq: Once | INTRAVENOUS | Status: AC
  Administered 2014-04-03: 4000 [IU] via INTRAVENOUS
  Filled 2014-04-03: qty 4000

## 2014-04-03 MED ORDER — VITAMIN C 500 MG PO TABS
500.0000 mg | ORAL_TABLET | Freq: Two times a day (BID) | ORAL | Status: DC
  Administered 2014-04-03 – 2014-04-06 (×6): 500 mg via ORAL
  Filled 2014-04-03 (×8): qty 1

## 2014-04-03 MED ORDER — SODIUM CHLORIDE 0.9 % IV BOLUS (SEPSIS): 1000.0000 mL | Freq: Once | INTRAVENOUS | Status: DC

## 2014-04-03 MED ORDER — TRAZODONE HCL 150 MG PO TABS
300.0000 mg | ORAL_TABLET | Freq: Every day | ORAL | Status: DC
  Administered 2014-04-03 – 2014-04-05 (×4): 300 mg via ORAL
  Filled 2014-04-03 (×5): qty 2

## 2014-04-03 MED ORDER — ACETAMINOPHEN 325 MG PO TABS
650.0000 mg | ORAL_TABLET | Freq: Four times a day (QID) | ORAL | Status: DC | PRN
  Administered 2014-04-06: 650 mg via ORAL
  Filled 2014-04-03: qty 2

## 2014-04-03 MED ORDER — DILTIAZEM LOAD VIA INFUSION
10.0000 mg | Freq: Once | INTRAVENOUS | Status: AC
  Administered 2014-04-03: 10 mg via INTRAVENOUS
  Filled 2014-04-03: qty 10

## 2014-04-03 MED ORDER — HEPARIN (PORCINE) IN NACL 100-0.45 UNIT/ML-% IJ SOLN
2200.0000 [IU]/h | INTRAMUSCULAR | Status: DC
  Administered 2014-04-03: 1150 [IU]/h via INTRAVENOUS
  Administered 2014-04-04: 1350 [IU]/h via INTRAVENOUS
  Administered 2014-04-04: 1900 [IU]/h via INTRAVENOUS
  Administered 2014-04-04: 1350 [IU]/h via INTRAVENOUS
  Administered 2014-04-05: 1900 [IU]/h via INTRAVENOUS
  Administered 2014-04-05 – 2014-04-06 (×3): 2200 [IU]/h via INTRAVENOUS
  Filled 2014-04-03 (×9): qty 250

## 2014-04-03 MED ORDER — DEXTROSE 5 % IV SOLN
1.0000 g | Freq: Every day | INTRAVENOUS | Status: DC
  Administered 2014-04-03 – 2014-04-04 (×2): 1 g via INTRAVENOUS
  Filled 2014-04-03 (×3): qty 10

## 2014-04-03 MED ORDER — DILTIAZEM HCL 100 MG IV SOLR
5.0000 mg/h | INTRAVENOUS | Status: DC
  Administered 2014-04-03: 5 mg/h via INTRAVENOUS
  Administered 2014-04-04: 10 mg/h via INTRAVENOUS
  Filled 2014-04-03 (×3): qty 100

## 2014-04-03 MED ORDER — METOPROLOL SUCCINATE ER 50 MG PO TB24
50.0000 mg | ORAL_TABLET | Freq: Every day | ORAL | Status: DC
  Administered 2014-04-03 – 2014-04-06 (×4): 50 mg via ORAL
  Filled 2014-04-03 (×5): qty 1

## 2014-04-03 MED ORDER — CYCLOSPORINE 0.05 % OP EMUL
1.0000 [drp] | Freq: Two times a day (BID) | OPHTHALMIC | Status: DC
  Administered 2014-04-03 – 2014-04-06 (×6): 1 [drp] via OPHTHALMIC
  Filled 2014-04-03 (×9): qty 1

## 2014-04-03 MED ORDER — ONDANSETRON HCL 4 MG/2ML IJ SOLN: 4.0000 mg | Freq: Four times a day (QID) | INTRAMUSCULAR | Status: DC | PRN

## 2014-04-03 MED ORDER — SODIUM CHLORIDE 0.9 % IV SOLN: INTRAVENOUS | Status: DC

## 2014-04-03 MED ORDER — ACETAMINOPHEN 650 MG RE SUPP: 650.0000 mg | Freq: Four times a day (QID) | RECTAL | Status: DC | PRN

## 2014-04-03 MED ORDER — SODIUM CHLORIDE 0.9 % IJ SOLN
3.0000 mL | Freq: Two times a day (BID) | INTRAMUSCULAR | Status: DC
  Administered 2014-04-03 – 2014-04-05 (×4): 3 mL via INTRAVENOUS

## 2014-04-03 MED ORDER — METOPROLOL TARTRATE 1 MG/ML IV SOLN
5.0000 mg | Freq: Once | INTRAVENOUS | Status: AC
  Administered 2014-04-03: 5 mg via INTRAVENOUS
  Filled 2014-04-03: qty 5

## 2014-04-03 MED ORDER — BOOST PLUS PO LIQD
1.0000 | Freq: Every day | ORAL | Status: DC
  Administered 2014-04-03 – 2014-04-05 (×2): 237 mL via ORAL
  Filled 2014-04-03 (×6): qty 237

## 2014-04-03 MED ORDER — ADULT MULTIVITAMIN W/MINERALS CH
1.0000 | ORAL_TABLET | Freq: Every day | ORAL | Status: DC
  Administered 2014-04-03 – 2014-04-06 (×4): 1 via ORAL
  Filled 2014-04-03 (×4): qty 1

## 2014-04-03 MED ORDER — DEXTROSE 5 % IV SOLN
1.0000 g | Freq: Once | INTRAVENOUS | Status: AC
  Administered 2014-04-03: 1 g via INTRAVENOUS
  Filled 2014-04-03: qty 10

## 2014-04-03 NOTE — Progress Notes (Addendum)
ANTICOAGULATION CONSULT NOTE - Initial Consult  Pharmacy Consult for Heparin Indication: atrial fibrillation  No Known Allergies  Patient Measurements:   Heparin Dosing Weight:88 kg    Vital Signs: Temp: 98.4 F (36.9 C) (05/18 0439) Temp src: Oral (05/18 0439) BP: 117/51 mmHg (05/18 1009) Pulse Rate: 137 (05/18 1158)  Labs:  Recent Labs  04/02/14 2132 04/03/14 0014 04/03/14 0508 04/03/14 1158  HGB 15.6  --  14.9  --   HCT 44.5  --  43.2  --   PLT 54*  --  50*  --   CREATININE 2.12*  --  1.82*  --   CKTOTAL  --  14873* 9538*  --   CKMB  --  216.2*  --   --   TROPONINI  --   --  <0.30 <0.30    The CrCl is unknown because both a height and weight (above a minimum accepted value) are required for this calculation.   Medical History: Past Medical History  Diagnosis Date  . Prostate cancer   . Melanoma     Left Shoulder  . Vision abnormalities     Cornea scarring  . Osteoarthritis     Knees  . Hypertension   . Pure hypercholesterolemia   . Depressive disorder, not elsewhere classified   . MVP (mitral valve prolapse)     a. With severe MR s/p Complex valvuloplasty including artificial Gore-tex neochord placement x4, chordal transposition x1, chordal release x1, # 32 mm Sorin Memo 3D Ring Annuloplasty 2012.  Marland Kitchen Personal history of colonic polyps   . Thrombocytopenia   . Mitral regurgitation   . PVC (premature ventricular contraction)   . Neuropathy   . Adenomatous polyps   . Internal nasal lesion 05/15/2013  . PAF (paroxysmal atrial fibrillation)     a. Post-op MVR 2012.  . Pulmonary HTN     a. Mild-mod by cath 2012.  Marland Kitchen Normal coronary arteries     a. Normal coronary anatomy by cath 2012.    Medications:  Prescriptions prior to admission  Medication Sig Dispense Refill  . acetaminophen (TYLENOL) 325 MG tablet Take 325 mg by mouth every 6 (six) hours as needed. For pain      . aspirin 81 MG tablet Take 81 mg by mouth daily.      Marland Kitchen atorvastatin (LIPITOR)  10 MG tablet Take 1 tablet (10 mg total) by mouth daily.  90 tablet  3  . celecoxib (CELEBREX) 200 MG capsule Take 200 mg by mouth daily.      . furosemide (LASIX) 40 MG tablet Take 1 tablet (40 mg total) by mouth daily.  90 tablet  1  . Glucosamine-Chondroitin (GLUCOSAMINE CHONDR COMPLEX PO) Take by mouth 3 (three) times daily.      Marland Kitchen lactose free nutrition (BOOST) LIQD Take 1 Container by mouth daily.      . metoprolol succinate (TOPROL-XL) 50 MG 24 hr tablet Take 1 tablet (50 mg total) by mouth daily.  90 tablet  3  . Multiple Vitamins-Minerals (MULTIVITAMINS THER. W/MINERALS) TABS Take 1 tablet by mouth daily.  30 each    . potassium chloride SA (K-DUR,KLOR-CON) 20 MEQ tablet Take 20-40 mEq by mouth 2 (two) times daily. 80meq in the morning and 69meq in the evening      . RESTASIS 0.05 % ophthalmic emulsion Place 1 drop into both eyes every 12 (twelve) hours.       . traZODone (DESYREL) 100 MG tablet Take 300 mg by mouth at  bedtime.       . vitamin C (ASCORBIC ACID) 500 MG tablet Take 500 mg by mouth 2 (two) times daily.        Assessment: Syncope 80 y/o M experienced syncopal event and fell forward hitting his head. Has had night sweats, weakness, poor appetite and increased HR. Patient has h/o brief episode of afib s/p MVR surgery in Nov 2012.   Patient presented to the ED in ARF (Scr 2.12) with UTI and rhabdo (CK [phone]), also now in afib. Patient is to be started on Rocephin for UTI and IV heparin for afib. Noted h/o thrombocytopenia (admit plts 54)  Labs: WBC 20.3. Plts 50, CK 9538 (down), Scr 1.82 (down), Hgb 14.9 10/07/2011: Patient did have +HIT test but negative SRA indicating this could have been a false positive. Watch closely with chronic thrombocytopenia.  Goal of Therapy:  Heparin level 0.3-0.7 units/ml Monitor platelets by anticoagulation protocol: Yes   Plan:  Heparin 4000 unit IV bolus Heparin infusion at 1150 units/hr Check heparin level in 8 hrs and  daily   Cala Kruckenberg S. Alford Highland, PharmD, [facility] Clinical Staff Pharmacist Pager [phone]  Wayland Salinas 04/03/2014,1:38 PM

## 2014-04-03 NOTE — H&P (Addendum)
Patient's PCP: Penni Homans, MD Patient's cardiologist: Dr. Mare Ferrari  Chief Complaint: Loss of consciousness  History of Present Illness: [name] [name] is a 80 y.o. Caucasian male with history of hypertension, hypercholesterolemia, depression, heart arrhythmia (presumed atrial fibrillation)? after surgery resolved not on any anticoagulation currently, mitral valve prolapse, and heart murmur who presents with the above complaints.  Patient lives by himself, on the night of 04/01/2014 patient reported that he woke up and had a loss of consciousness episode where he fell forward and struck the table.  He is uncertain what time he woke up but the next thing she remembers is waking up at 4 a.m. on 04/02/2014.  As a result he presented to the emergency department for further evaluation.  Patient reports that over the last week he has had increasing night sweats.  Prior to the fall he reports that his heart rate had also gone up.  He also reports having poor appetite.  As a result he presented to the emergency department for further evaluation.  Patient denies any recent fevers except night sweats.  Denies any abdominal pain, diarrhea, or headaches.  Denies any chest pain or shortness of breath at this time.  Patient was found to be in acute failure with creatinine 2.12, urinary tract infection, and rhabdomyolysis.  Hospitalist service was asked to admit the patient for further care and management.  Review of Systems: All systems reviewed with the patient and positive as per history of present illness, otherwise all other systems are negative.  Past Medical History  Diagnosis Date  . Prostate cancer   . Melanoma     Left Shoulder  . Vision abnormalities     Cornea scarring  . Osteoarthritis     Knees  . Hypertension   . Pure hypercholesterolemia   . Depressive disorder, not elsewhere classified   . MVP (mitral valve prolapse)   . Personal history of colonic polyps   . Valvular heart disease      Last echo in 2010 with EF 55 to 60%, +MVP, mild MR, diastolic dysfunction, biatrial enlargement, mild AI and mild pulmonary HTN  . Normal nuclear stress test 2007  . Thrombocytopenia   . Mitral regurgitation   . MVP (mitral valve prolapse)   . PVC (premature ventricular contraction)   . Shortness of breath   . Blood transfusion   . Neuropathy   . Wears glasses   . Heart murmur     Dr Mare Ferrari cardiologist  . Adenomatous polyps   . Internal nasal lesion 05/15/2013   Past Surgical History  Procedure Laterality Date  . Nuclear stress test  09/2006    EF-64%, Normal  . US echocardiography  09/2009, 08/1011    mild LVH,mild AI,MVP with mild MR, mild-mod. TR with mild Pulm. HTN, EF-55-60%  . Inguinal hernia repair  09/2009    Left  . Knee arthroscopy       left x3  and right x2  . Colonoscopy w/ polypectomy    . Prostatectomy  1993  . Rotator cuff repair  2003    left  . Melanoma surgery      2001, 2005, 2006, 2009  . Tee without cardioversion  09/26/2011    Procedure: TRANSESOPHAGEAL ECHOCARDIOGRAM (TEE);  Surgeon: Lelon Perla, MD;  Location: [facility] ENDOSCOPY;  Service: Cardiovascular;  Laterality: N/A;  . Mitral valve repair  10/01/2011    complex valvuloplasty with Goretex cord replacement and chordal transposition 62mm Sorin Memo 3D ring annuloplasty  . Root  canal  08-19-12   Family History  Problem Relation Age of Onset  . Clotting disorder Brother     CVA's  . Arthritis Mother   . Hypertension Mother   . Stroke Mother   . Hypertension Father   . Psychosis Father     psychiatric care  . Colon cancer Neg Hx   . Stomach cancer Neg Hx    History   Social History  . Marital Status: Widowed    Spouse Name: N/A    Number of Children: N/A  . Years of Education: N/A   Occupational History  . Not on file.   Social History Main Topics  . Smoking status: Never Smoker   . Smokeless tobacco: Never Used  . Alcohol Use: No  . Drug Use: No  . Sexual Activity: Not on  file   Other Topics Concern  . Not on file   Social History Narrative   Retired - Optometrist   Widower   2 children   Allergies: Review of patient's allergies indicates no known allergies.  Home Meds: Prior to Admission medications   Medication Sig Start Date End Date Taking? Authorizing Provider  acetaminophen (TYLENOL) 325 MG tablet Take 325 mg by mouth every 6 (six) hours as needed. For pain   Yes Historical Provider, MD  aspirin 81 MG tablet Take 81 mg by mouth daily.   Yes Historical Provider, MD  atorvastatin (LIPITOR) 10 MG tablet Take 1 tablet (10 mg total) by mouth daily. 07/05/13  Yes Darlin Coco, MD  celecoxib (CELEBREX) 200 MG capsule Take 200 mg by mouth daily.   Yes Historical Provider, MD  furosemide (LASIX) 40 MG tablet Take 1 tablet (40 mg total) by mouth daily. 02/03/14  Yes Darlin Coco, MD  Glucosamine-Chondroitin (GLUCOSAMINE CHONDR COMPLEX PO) Take by mouth 3 (three) times daily.   Yes Historical Provider, MD  lactose free nutrition (BOOST) LIQD Take 1 Container by mouth daily.   Yes Historical Provider, MD  metoprolol succinate (TOPROL-XL) 50 MG 24 hr tablet Take 1 tablet (50 mg total) by mouth daily. 07/05/13  Yes Darlin Coco, MD  Multiple Vitamins-Minerals (MULTIVITAMINS THER. W/MINERALS) TABS Take 1 tablet by mouth daily. 10/16/11  Yes Donielle Liston Alba, PA-C  potassium chloride SA (K-DUR,KLOR-CON) 20 MEQ tablet Take 20-40 mEq by mouth 2 (two) times daily. 71meq in the morning and 6meq in the evening   Yes Historical Provider, MD  RESTASIS 0.05 % ophthalmic emulsion Place 1 drop into both eyes every 12 (twelve) hours.  06/09/11  Yes Historical Provider, MD  traZODone (DESYREL) 100 MG tablet Take 300 mg by mouth at bedtime.  10/17/11  Yes Donielle Liston Alba, PA-C  vitamin C (ASCORBIC ACID) 500 MG tablet Take 500 mg by mouth 2 (two) times daily.   Yes Historical Provider, MD    Physical Exam: Blood pressure 122/71, pulse 125, temperature 98.8 F  (37.1 C), temperature source Rectal, resp. rate 21, SpO2 96.00%. General: Awake, Oriented x3, No acute distress, bruising over his right and also left forehead. HEENT: EOMI, Moist mucous membranes Neck: Supple CV: S1 and S2 Lungs: Clear to ascultation bilaterally Abdomen: Soft, Nontender, Nondistended, +bowel sounds. Ext: Good pulses. Trace edema. No clubbing or cyanosis noted. Neuro: Cranial Nerves II-XII grossly intact. Has 5/5 motor strength in upper and lower extremities.  Lab results:  Recent Labs  04/02/14 2132  NA 139  K 5.3  CL 102  CO2 23  GLUCOSE 111*  BUN 51*  CREATININE 2.12*  CALCIUM  9.6   No results found for this basename: AST, ALT, ALKPHOS, BILITOT, PROT, ALBUMIN,  in the last 72 hours No results found for this basename: LIPASE, AMYLASE,  in the last 72 hours  Recent Labs  04/02/14 2132  WBC 25.9*  HGB 15.6  HCT 44.5  MCV 90.4  PLT 54*    Recent Labs  04/03/14 0014  CKTOTAL 44034*  CKMB 216.2*   No components found with this basename: POCBNP,  No results found for this basename: DDIMER,  in the last 72 hours No results found for this basename: HGBA1C,  in the last 72 hours No results found for this basename: CHOL, HDL, LDLCALC, TRIG, CHOLHDL, LDLDIRECT,  in the last 72 hours No results found for this basename: TSH, T4TOTAL, FREET3, T3FREE, THYROIDAB,  in the last 72 hours No results found for this basename: VITAMINB12, FOLATE, FERRITIN, TIBC, IRON, RETICCTPCT,  in the last 72 hours Imaging results:  Ct Head Wo Contrast  04/03/2014   CLINICAL DATA:  FALL  EXAM: CT HEAD WITHOUT CONTRAST  TECHNIQUE: Contiguous axial images were obtained from the base of the skull through the vertex without intravenous contrast.  COMPARISON:  None.  FINDINGS: No acute intracranial abnormality. Specifically, no hemorrhage, hydrocephalus, mass lesion, acute infarction, or significant intracranial injury. No acute calvarial abnormality. Mild age appropriate global atrophy.  The visualized paranasal sinuses and mastoid air cells are patent.  IMPRESSION: No acute intracranial abnormality.   Electronically Signed   By: Margaree Mackintosh M.D.   On: 04/03/2014 01:19   Dg Chest Portable 1 View  04/03/2014   CLINICAL DATA:  FALL  EXAM: PORTABLE CHEST - 1 VIEW  COMPARISON:  DG CHEST 2 VIEW dated 12/29/2011  FINDINGS: Cardiac silhouette is enlarged. Patient is status post median sternotomy. There is minimal blunting of the left costophrenic angle. Lungs are clear. No acute osseous abnormalities.  IMPRESSION: Scarring versus small effusion left costophrenic angle. No acute cardiopulmonary disease.   Electronically Signed   By: Margaree Mackintosh M.D.   On: 04/03/2014 00:53   Other results: EKG: Sinus tachycardia with heart rate of 101.  Assessment & Plan by Problem: Syncope Etiology unclear.  Admit the patient to telemetry.  Cycle cardiac enzymes rule the patient for acute coronary syndrome.  Initial troponin negative.  Also check patient's orthostatics.  Will request 2-D echocardiogram for further evaluation.  Patient's last 2-D echocardiogram on 02/03/2013 showed cavity size was normal, wall thickness was normal, systolic function was normal, EF 74-25%, grade 1 diastolic dysfunction, mild aortic valve regurgitation, and mitral valve stenosis.  Patient has a urinary tract infection uncertain if this is what contributed to his syncope.  Head CT does not show any acute intracranial abnormality.  Chest x-ray shows scarring versus small effusion in the left costophrenic angle, no acute cardiopulmonary disease.  Acute renal failure Etiology unclear.  May be due to urinary tract infection.  Discontinue furosemide and Celebrex for now.  Hydrate the patient on IV fluids, and renal function does not improve with hydration consider renal ultrasound and further evaluation.  Rhabdomyolysis Likely due to patient being on the floor for unknown period of time.  Hydrate the patient on IV fluids.   Continue to monitor CK.  Urinary tract infection Started on ceftriaxone which will be continued.  Further titration of antibiotics depending on urine culture results and sensitivities.  Generalized weakness Requests physical therapy and occupational therapy evaluation.  Leukocytosis Likely due to urinary tract infection.  If an episodes of diarrhea,  consider sending stool for C. difficile given degree of leukocytosis.  History of atrial fibrillation after surgery Resolved.  Not on any anticoagulation.  Currently in sinus with PVCs.  Hypertension Continue home antihypertensive medications.  Hypercholesterolemia Hold statin due to rhabdomyolysis.  Thrombocytopenia Chronic.  Continue to monitor.  Uncertain etiology.  Depression Stable.  Continue home medications.  GERD Stable.  Prophylaxis SCDs.  No heparin products given thrombocytopenia.  CODE STATUS Full code.  This was discussed with the patient time of admission.  Disposition Admit the patient as inpatient to telemetry.  Time spent on admission, talking to the patient, and coordinating care was: 50 mins.  Bynum Bellows, MD 04/03/2014, 2:36 AM

## 2014-04-03 NOTE — ED Notes (Signed)
Pt reports "having a few stumbles" over the last month, but pt states he fell last night and does not remember how he fell or how long he was down for. Pt has a also reported having night sweats and feeling fatigue. Pt denies chest pain, SOB, nausea, vomiting, congestion, or problems with bowel or bladder.  Pt has bruising to BUE, bilateral knees, Right mid back, and abrasions to Right forehead. Pt alert and oriented x4, lives alone and takes care of himself

## 2014-04-03 NOTE — ED Provider Notes (Signed)
CSN: [id number]     Arrival date & time 04/02/14  2014 History   First MD Initiated Contact with Patient 04/02/14 2326     Chief Complaint  Patient presents with  . Fall     (Consider location/radiation/quality/duration/timing/severity/associated sxs/prior Treatment) HPI Hx per PT - lives alone, has been having night sweats and generalized weakness, last night at home woke up and had syncopal event, woke on the floor unk downtime. He has hematoma to scalp. His daughter found out today and brings him in for evaluation. He denies any CP, SOB, palpitations, cough, ABD pain, back pain or diff urinating. He has dark urine tonight.  Past Medical History  Diagnosis Date  . Prostate cancer   . Melanoma     Left Shoulder  . Vision abnormalities     Cornea scarring  . Osteoarthritis     Knees  . Hypertension   . Pure hypercholesterolemia   . Depressive disorder, not elsewhere classified   . MVP (mitral valve prolapse)   . Personal history of colonic polyps   . Valvular heart disease     Last echo in 2010 with EF 55 to 60%, +MVP, mild MR, diastolic dysfunction, biatrial enlargement, mild AI and mild pulmonary HTN  . Normal nuclear stress test 2007  . Thrombocytopenia   . Mitral regurgitation   . MVP (mitral valve prolapse)   . PVC (premature ventricular contraction)   . Shortness of breath   . Blood transfusion   . Neuropathy   . Wears glasses   . Heart murmur     Dr Mare Ferrari cardiologist  . Adenomatous polyps   . Internal nasal lesion 05/15/2013   Past Surgical History  Procedure Laterality Date  . Nuclear stress test  09/2006    EF-64%, Normal  . US echocardiography  09/2009, 08/1011    mild LVH,mild AI,MVP with mild MR, mild-mod. TR with mild Pulm. HTN, EF-55-60%  . Inguinal hernia repair  09/2009    Left  . Knee arthroscopy       left x3  and right x2  . Colonoscopy w/ polypectomy    . Prostatectomy  1993  . Rotator cuff repair  2003    left  . Melanoma surgery     2001, 2005, 2006, 2009  . Tee without cardioversion  09/26/2011    Procedure: TRANSESOPHAGEAL ECHOCARDIOGRAM (TEE);  Surgeon: Lelon Perla, MD;  Location: [facility] ENDOSCOPY;  Service: Cardiovascular;  Laterality: N/A;  . Mitral valve repair  10/01/2011    complex valvuloplasty with Goretex cord replacement and chordal transposition 80mm Sorin Memo 3D ring annuloplasty  . Root canal  08-19-12   Family History  Problem Relation Age of Onset  . Clotting disorder Brother     CVA's  . Arthritis Mother   . Hypertension Mother   . Stroke Mother   . Hypertension Father   . Psychosis Father     psychiatric care  . Colon cancer Neg Hx   . Stomach cancer Neg Hx    History  Substance Use Topics  . Smoking status: Never Smoker   . Smokeless tobacco: Never Used  . Alcohol Use: No    Review of Systems  Constitutional: Negative for fever and chills.  Eyes: Negative for visual disturbance.  Respiratory: Negative for shortness of breath.   Cardiovascular: Negative for chest pain.  Gastrointestinal: Negative for vomiting and abdominal pain.  Genitourinary: Negative for dysuria and difficulty urinating.  Musculoskeletal: Negative for back pain, neck pain and neck  stiffness.  Skin: Positive for wound. Negative for rash.  Neurological: Positive for syncope. Negative for headaches.  All other systems reviewed and are negative.     Allergies  Review of patient's allergies indicates no known allergies.  Home Medications   Prior to Admission medications   Medication Sig Start Date End Date Taking? Authorizing Provider  acetaminophen (TYLENOL) 325 MG tablet Take 325 mg by mouth every 6 (six) hours as needed. For pain   Yes Historical Provider, MD  aspirin 81 MG tablet Take 81 mg by mouth daily.   Yes Historical Provider, MD  atorvastatin (LIPITOR) 10 MG tablet Take 1 tablet (10 mg total) by mouth daily. 07/05/13  Yes Darlin Coco, MD  celecoxib (CELEBREX) 200 MG capsule Take 200 mg by mouth  daily.   Yes Historical Provider, MD  furosemide (LASIX) 40 MG tablet Take 1 tablet (40 mg total) by mouth daily. 02/03/14  Yes Darlin Coco, MD  Glucosamine-Chondroitin (GLUCOSAMINE CHONDR COMPLEX PO) Take by mouth 3 (three) times daily.   Yes Historical Provider, MD  lactose free nutrition (BOOST) LIQD Take 1 Container by mouth daily.   Yes Historical Provider, MD  metoprolol succinate (TOPROL-XL) 50 MG 24 hr tablet Take 1 tablet (50 mg total) by mouth daily. 07/05/13  Yes Darlin Coco, MD  Multiple Vitamins-Minerals (MULTIVITAMINS THER. W/MINERALS) TABS Take 1 tablet by mouth daily. 10/16/11  Yes Donielle Liston Alba, PA-C  potassium chloride SA (K-DUR,KLOR-CON) 20 MEQ tablet Take 20-40 mEq by mouth 2 (two) times daily. 33meq in the morning and 78meq in the evening   Yes Historical Provider, MD  RESTASIS 0.05 % ophthalmic emulsion Place 1 drop into both eyes every 12 (twelve) hours.  06/09/11  Yes Historical Provider, MD  traZODone (DESYREL) 100 MG tablet Take 300 mg by mouth at bedtime.  10/17/11  Yes Donielle Liston Alba, PA-C  vitamin C (ASCORBIC ACID) 500 MG tablet Take 500 mg by mouth 2 (two) times daily.   Yes Historical Provider, MD   BP 112/60  Pulse 83  Temp(Src) 97.2 F (36.2 C) (Oral)  Resp 18  SpO2 95% Physical Exam  Constitutional: He is oriented to person, place, and time. He appears well-developed and well-nourished.  HENT:  Head: Normocephalic.  Dry mm Abrasion and mild hematoma R frontal scalp, no lac  Eyes: EOM are normal. Pupils are equal, round, and reactive to light. No scleral icterus.  Neck: Neck supple.  Cardiovascular: Normal rate, regular rhythm and intact distal pulses.   Pulmonary/Chest: Effort normal and breath sounds normal. No respiratory distress.  Abdominal: Soft. Bowel sounds are normal. He exhibits no distension. There is no tenderness. There is no rebound and no guarding.  Musculoskeletal: Normal range of motion. He exhibits no edema and no  tenderness.  Neurological: He is alert and oriented to person, place, and time. No cranial nerve deficit. Coordination normal.  Skin: Skin is warm and dry. No rash noted.    ED Course  Procedures (including critical care time) Labs Review Labs Reviewed  CBC - Abnormal; Notable for the following:    WBC 25.9 (*)    Platelets 54 (*)    All other components within normal limits  BASIC METABOLIC PANEL - Abnormal; Notable for the following:    Glucose, Bld 111 (*)    BUN 51 (*)    Creatinine, Ser 2.12 (*)    GFR calc non Af Amer 28 (*)    GFR calc Af Amer 32 (*)    All other  components within normal limits  URINALYSIS, ROUTINE W REFLEX MICROSCOPIC - Abnormal; Notable for the following:    Color, Urine AMBER (*)    APPearance TURBID (*)    Hgb urine dipstick LARGE (*)    Ketones, ur 15 (*)    Protein, ur 100 (*)    Nitrite POSITIVE (*)    Leukocytes, UA LARGE (*)    All other components within normal limits  URINE MICROSCOPIC-ADD ON - Abnormal; Notable for the following:    Squamous Epithelial / LPF FEW (*)    Bacteria, UA MANY (*)    Casts HYALINE CASTS (*)    All other components within normal limits  CK TOTAL AND CKMB - Abnormal; Notable for the following:    Total CK 14873 (*)    CK, MB 216.2 (*)    All other components within normal limits  URINE CULTURE  I-STAT TROPOININ, ED  I-STAT CG4 LACTIC ACID, ED    Imaging Review Ct Head Wo Contrast  04/03/2014   CLINICAL DATA:  FALL  EXAM: CT HEAD WITHOUT CONTRAST  TECHNIQUE: Contiguous axial images were obtained from the base of the skull through the vertex without intravenous contrast.  COMPARISON:  None.  FINDINGS: No acute intracranial abnormality. Specifically, no hemorrhage, hydrocephalus, mass lesion, acute infarction, or significant intracranial injury. No acute calvarial abnormality. Mild age appropriate global atrophy. The visualized paranasal sinuses and mastoid air cells are patent.  IMPRESSION: No acute intracranial  abnormality.   Electronically Signed   By: Margaree Mackintosh M.D.   On: 04/03/2014 01:19   Dg Chest Portable 1 View  04/03/2014   CLINICAL DATA:  FALL  EXAM: PORTABLE CHEST - 1 VIEW  COMPARISON:  DG CHEST 2 VIEW dated 12/29/2011  FINDINGS: Cardiac silhouette is enlarged. Patient is status post median sternotomy. There is minimal blunting of the left costophrenic angle. Lungs are clear. No acute osseous abnormalities.  IMPRESSION: Scarring versus small effusion left costophrenic angle. No acute cardiopulmonary disease.   Electronically Signed   By: Margaree Mackintosh M.D.   On: 04/03/2014 00:53     EKG Interpretation   Date/Time:  Sunday Apr 02 2014 20:18:10 EDT Ventricular Rate:  101 PR Interval:  196 QRS Duration: 82 QT Interval:  350 QTC Calculation: 453 R Axis:   61 Text Interpretation:  Sinus tachycardia First degree A-V block pvcs  Nonspecific ST abnormality Abnormal ECG Confirmed by Nan Maya  MD, Phoenyx Paulsen  [phone]) on 04/02/2014 11:31:38 PM     IV fluids. IV antibiotics. Discussed with triad hospitalist Dr. Reece Levy who will admit. MDM   Diagnosis: Syncope, acute renal failure, UTI, rhabdo  Presents with syncope from home, night sweats and dark urine Evaluated EKG, chest x-ray, CT brain and labs obtained and reviewed as above. WBC 25K Treated with IV fluids and antibiotics. Urine culture pending. Medical admission    Teressa Lower, MD 04/03/14 [phone]

## 2014-04-03 NOTE — ED Notes (Signed)
Patient transported to CT 

## 2014-04-03 NOTE — Progress Notes (Signed)
NP on call notified of pt's hr sustaining in the 130-140s. Per pt he feels his heart racing & it does this when he doesn't take his metoprolol. Pt stated he did however take his metoprolol Sunday morning as scheduled. EKG taken to confirm rhythm & it showed ST with PVCs. New orders given. Will continue to monitor the pt. [name], RN

## 2014-04-03 NOTE — Progress Notes (Signed)
Patient seen and evaluated earlier this AM by my associate. Please refer to his H and P for details regarding assessment and plan.  Last recorded HR 130's despite Toprol- XL of 50 mg administered earlier this AM. As well as 1 time lopressor injection of 5 mg.  Blood pressure 110/64. Troponin negative.  Pt has history of Atrial fibrillation and was seen by Dr. Mare Ferrari on 01/27/14.  Will consult cardiology for further recommendations regarding management of Atrial fibrillation. Afib most likely exacerbated due to ongoing infection (uti).  Will reassess next am.  Velvet Bathe

## 2014-04-03 NOTE — Evaluation (Signed)
Physical Therapy Evaluation Patient Details Name: [name] [name] MRN: [id number] DOB: 1931-05-06 Today's Date: 04/03/2014   History of Present Illness  Pt admitted after syncopal episode with UTI, chronic thrombocytopenia  Clinical Impression  Pt pleasant and reports decreased balance and activity from baseline. Pt with HR elevated throughout with pt noting the feeling of flutter but no pain and dyspnea 1/4 with sats 98% on RA. Pt will benefit from acute therapy to maximize mobility and gait to return pt to independent level for return home.     Follow Up Recommendations No PT follow up    Equipment Recommendations  None recommended by PT     Recommendations for Other Services       Precautions / Restrictions Precautions Precautions: Fall      Mobility  Bed Mobility Overal bed mobility: Modified Independent                Transfers Overall transfer level: Modified independent                  Ambulation/Gait Ambulation/Gait assistance: Supervision Ambulation Distance (Feet): 100 Feet Assistive device: Rolling walker (2 wheeled) Gait Pattern/deviations: Step-through pattern;Decreased stride length   Gait velocity interpretation: Below normal speed for age/gender General Gait Details: pt with initially unsteady gait feeling uncomfortable with independent ambulation with cues for posture and position in RW. Activity limited by HR 125-140 with gait  Stairs            Wheelchair Mobility    Modified Rankin (Stroke Patients Only)       Balance Overall balance assessment: Needs assistance;History of Falls   Sitting balance-Leahy Scale: Good       Standing balance-Leahy Scale: Fair                               Pertinent Vitals/Pain No pain    Home Living Family/patient expects to be discharged to:: Private residence Living Arrangements: Alone Available Help at Discharge: Family;Available PRN/intermittently Type of Home:  House Home Access: Stairs to enter Entrance Stairs-Rails: None Entrance Stairs-Number of Steps: 2 Home Layout: Two level;Bed/bath upstairs;1/2 bath on main level Home Equipment: Cane - single point      Prior Function Level of Independence: Independent               Hand Dominance        Extremity/Trunk Assessment   Upper Extremity Assessment: Overall WFL for tasks assessed           Lower Extremity Assessment: Overall WFL for tasks assessed      Cervical / Trunk Assessment: Normal  Communication   Communication: No difficulties  Cognition Arousal/Alertness: Awake/alert Behavior During Therapy: WFL for tasks assessed/performed Overall Cognitive Status: Within Functional Limits for tasks assessed                      General Comments      Exercises        Assessment/Plan    PT Assessment Patient needs continued PT services  PT Diagnosis Difficulty walking   PT Problem List Decreased mobility;Cardiopulmonary status limiting activity;Decreased knowledge of use of DME  PT Treatment Interventions Gait training;Stair training;Therapeutic activities;Patient/family education;Functional mobility training;DME instruction   PT Goals (Current goals can be found in the Care Plan section) Acute Rehab PT Goals Patient Stated Goal: return home and watch movies PT Goal Formulation: With patient Time For Goal Achievement: 04/10/14 Potential  to Achieve Goals: Good    Frequency Min 3X/week   Barriers to discharge Decreased caregiver support      Co-evaluation               End of Session Equipment Utilized During Treatment: Gait belt Activity Tolerance: Patient tolerated treatment well Patient left: in chair;with call bell/phone within reach (no chair alarm present on unit, RN aware and agreeable with OOB and Pt instructed for no independent mobility) Nurse Communication: Mobility status         Time: 6734-1937 PT Time Calculation (min): 15  min   Charges:   PT Evaluation $Initial PT Evaluation Tier I: 1 Procedure     PT G Codes:          Treven Holtman B Kyleeann Cremeans 04/03/2014, 12:02 PM  Elwyn Reach, [place]

## 2014-04-03 NOTE — ED Notes (Signed)
Transporting patient to new room assignment. 

## 2014-04-03 NOTE — Progress Notes (Signed)
Dr. Wendee Beavers paged and informed that pt's hr is now going up to the 150's  And pt seems to be asymptomatic with this hr. Dr. Wendee Beavers states that cardiology has been consulted and to go ahead and call trish thru [employer] to see if they can come to see patient. Charge nurse kristin calling.

## 2014-04-03 NOTE — ED Notes (Signed)
Family at bedside. 

## 2014-04-03 NOTE — ED Notes (Signed)
Patient unable to give a urine sample at this time.

## 2014-04-03 NOTE — Progress Notes (Signed)
Dr. Wendee Beavers paged.  Awaiting return call.

## 2014-04-03 NOTE — Progress Notes (Signed)
  Echocardiogram 2D Echocardiogram has been performed.  [name] [name] 04/03/2014, 4:27 PM

## 2014-04-03 NOTE — Progress Notes (Signed)
Pt got up to bedside to use the urinal & his hr went back up to the high 120s-140s. NP on call notified. New orders to give his day time dose of metoprolol 50 mg now. Will continue to monitor the pt. [name], RN

## 2014-04-03 NOTE — Progress Notes (Signed)
Dr. Leodis Binet name is now listed as the attending so he was paged but says he is not covering this pt but it is dr. Wendee Beavers.  Will page him.

## 2014-04-03 NOTE — Consult Note (Signed)
CARDIOLOGY CONSULTATION NOTE.  NAME:  [name] [name]   MRN: [id number] DOB:  Oct 10, 1931   ADMIT DATE: 04/02/2014  Reason for Consult: Afib RVR   Requesting Physician: Dr. Velvet Bathe - [facility]  Primary Cardiologist: Mare Ferrari Primary Care Provider: Penni Homans, MD  HPI: This is a 80 y.o. male with a past medical history significant for MVR for severe MVP & MR in 09/2011 (brief episodes of post-op Afib - no recurrence until now, not on [facility]), thrombocytopenia (platelet count 60-90k), prostate CA, normal coronaries 2012, normal EF 01/2013, HTN, HLD who presented to [facility] yesterday for evaluation of syncope. Admitted following a syncopal event - he is amnestic preceding the event but awoke in the middle of the night. He believes he likely stood up to go to the bathroom. He woke up beside his bed on his right side with his nightstand contents knocked off. The fall was unwitnessed. He does not know long he was out. He was somewhat confused when coming to. Noted ~1 week night sweats without fever, & weakness with poor appetite. He denies CP. His family told him he had SOB on 5/16. However, he did not notice this. He is not tachypnic or hypoxic. ER eval noted ARF (Cr up to 2.12 with elevated CK & negative Troponin -- rhabdo) & UTI.  Admitted by [facility]. He is being treated with IVF and abx. This AM - he went into Afib with RVR HR 160s-170s.  We are consulted for assistance.  He has chronic thrombocytopenia but it is not clear that this has been worked up. The patient denies any history of bleeding including blood in stool, urine, epistaxis or any other unusual bleeding.  PMHx:  CARDIAC HISTORY: cardiac catheterization, echocardiogram, EPS study and Mitral Valve Repair Echo:  Most recent 01/2013 - see below  Cath:  09/2011 normal coronary arteries  MITRAL VALVE REPAIR / Valvuloplasty: 10/01/2011: Sorin Memo 3D ring annuolplasty size 32 mm)  Past Medical History  Diagnosis Date  . Prostate  cancer   . Melanoma     Left Shoulder  . Vision abnormalities     Cornea scarring  . Osteoarthritis     Knees  . Hypertension   . Pure hypercholesterolemia   . Depressive disorder, not elsewhere classified   . MVP (mitral valve prolapse)     a. With severe MR s/p Complex valvuloplasty including artificial Gore-tex neochord placement x4, chordal transposition x1, chordal release x1, # 32 mm Sorin Memo 3D Ring Annuloplasty 2012.  Marland Kitchen Personal history of colonic polyps   . Thrombocytopenia   . Mitral regurgitation   . PVC (premature ventricular contraction)   . Neuropathy   . Adenomatous polyps   . Internal nasal lesion 05/15/2013  . PAF (paroxysmal atrial fibrillation)     a. Post-op MVR 2012.  . Pulmonary HTN     a. Mild-mod by cath 2012.  Marland Kitchen Normal coronary arteries     a. Normal coronary anatomy by cath 2012.     Past Surgical History  Procedure Laterality Date  . Nuclear stress test  09/2006    EF-64%, Normal  . US echocardiography  09/2009, 08/1011    mild LVH,mild AI,MVP with mild MR, mild-mod. TR with mild Pulm. HTN, EF-55-60%  . Inguinal hernia repair  09/2009    Left  . Knee arthroscopy       left x3  and right x2  . Colonoscopy w/ polypectomy    . Prostatectomy  1993  . Rotator  cuff repair  2003    left  . Melanoma surgery      2001, 2005, 2006, 2009  . Tee without cardioversion  09/26/2011    Procedure: TRANSESOPHAGEAL ECHOCARDIOGRAM (TEE);  Surgeon: Lelon Perla, MD;  Location: [facility] ENDOSCOPY;  Service: Cardiovascular;  Laterality: N/A;  . Mitral valve repair  10/01/2011    complex valvuloplasty with Goretex cord replacement and chordal transposition 12mm Sorin Memo 3D ring annuloplasty  . Root canal  08-19-12  . Cardiac catheterization  09/2011    Pre-op for MVR -- normal coronaries.    FAMHx: Family History  Problem Relation Age of Onset  . Clotting disorder Brother     CVA's  . Arthritis Mother   . Hypertension Mother   . Stroke Mother   .  Hypertension Father   . Psychosis Father     psychiatric care  . Colon cancer Neg Hx   . Stomach cancer Neg Hx     SOCHx:  reports that he has never smoked. He has never used smokeless tobacco. He reports that he does not drink alcohol or use illicit drugs.  ALLERGIES: No Known Allergies  ROS: A comprehensive review of systems was negative except for as noted above.  HOME MEDICATIONS: Prescriptions prior to admission  Medication Sig Dispense Refill  . acetaminophen (TYLENOL) 325 MG tablet Take 325 mg by mouth every 6 (six) hours as needed. For pain      . aspirin 81 MG tablet Take 81 mg by mouth daily.      Marland Kitchen atorvastatin (LIPITOR) 10 MG tablet Take 1 tablet (10 mg total) by mouth daily.  90 tablet  3  . celecoxib (CELEBREX) 200 MG capsule Take 200 mg by mouth daily.      . furosemide (LASIX) 40 MG tablet Take 1 tablet (40 mg total) by mouth daily.  90 tablet  1  . Glucosamine-Chondroitin (GLUCOSAMINE CHONDR COMPLEX PO) Take by mouth 3 (three) times daily.      Marland Kitchen lactose free nutrition (BOOST) LIQD Take 1 Container by mouth daily.      . metoprolol succinate (TOPROL-XL) 50 MG 24 hr tablet Take 1 tablet (50 mg total) by mouth daily.  90 tablet  3  . Multiple Vitamins-Minerals (MULTIVITAMINS THER. W/MINERALS) TABS Take 1 tablet by mouth daily.  30 each    . potassium chloride SA (K-DUR,KLOR-CON) 20 MEQ tablet Take 20-40 mEq by mouth 2 (two) times daily. 22meq in the morning and 54meq in the evening      . RESTASIS 0.05 % ophthalmic emulsion Place 1 drop into both eyes every 12 (twelve) hours.       . traZODone (DESYREL) 100 MG tablet Take 300 mg by mouth at bedtime.       . vitamin C (ASCORBIC ACID) 500 MG tablet Take 500 mg by mouth 2 (two) times daily.        HOSPITAL MEDICATIONS: Scheduled Meds: . aspirin EC  81 mg Oral Daily  . cefTRIAXone (ROCEPHIN)  IV  1 g Intravenous QHS  . cycloSPORINE  1 drop Both Eyes Q12H  . diltiazem  10 mg Intravenous Once  . lactose free nutrition   1 Container Oral Q1500  . metoprolol succinate  50 mg Oral Daily  . multivitamin with minerals  1 tablet Oral Daily  . sodium chloride  3 mL Intravenous Q12H  . traZODone  300 mg Oral QHS  . vitamin C  500 mg Oral BID   Continuous Infusions: . sodium  chloride 100 mL/hr at 04/03/14 0253  . diltiazem (CARDIZEM) infusion     PRN Meds:.acetaminophen, acetaminophen, ondansetron (ZOFRAN) IV, ondansetron  VITALS: Blood pressure 117/51, pulse 137, temperature 98.4 F (36.9 C), temperature source Oral, resp. rate 18, SpO2 94.00%.  PHYSICAL EXAM: General appearance: alert, cooperative and no distress, feels warm to the touch Neck: no adenopathy, no carotid bruit, supple, symmetrical, trachea midline and thyroid not enlarged, symmetric, no tenderness/mass/nodules, JVD elevated Lungs: clear to auscultation bilaterally Heart: irregularly irregular rhythm, tachycardic, no M/R/G Abdomen: soft, non-tender; bowel sounds normal; no masses,  no organomegaly Extremities: extremities normal, atraumatic, no cyanosis or edema Pulses: 2+ and symmetric Neurologic: No focal neurologic deficit except hard of hearing  LABS: Results for orders placed during the hospital encounter of 04/02/14 (from the past 24 hour(s))  CBC     Status: Abnormal   Collection Time    04/02/14  9:32 PM      Result Value Ref Range   WBC 25.9 (*) 4.0 - 10.5 K/uL   RBC 4.92  4.22 - 5.81 MIL/uL   Hemoglobin 15.6  13.0 - 17.0 g/dL   HCT 44.5  39.0 - 52.0 %   MCV 90.4  78.0 - 100.0 fL   MCH 31.7  26.0 - 34.0 pg   MCHC 35.1  30.0 - 36.0 g/dL   RDW 13.3  11.5 - 15.5 %   Platelets 54 (*) 150 - 400 K/uL  BASIC METABOLIC PANEL     Status: Abnormal   Collection Time    04/02/14  9:32 PM      Result Value Ref Range   Sodium 139  137 - 147 mEq/L   Potassium 5.3  3.7 - 5.3 mEq/L   Chloride 102  96 - 112 mEq/L   CO2 23  19 - 32 mEq/L   Glucose, Bld 111 (*) 70 - 99 mg/dL   BUN 51 (*) 6 - 23 mg/dL   Creatinine, Ser 2.12 (*) 0.50 -  1.35 mg/dL   Calcium 9.6  8.4 - 10.5 mg/dL   GFR calc non Af Amer 28 (*) >90 mL/min   GFR calc Af Amer 32 (*) >90 mL/min  URINALYSIS, ROUTINE W REFLEX MICROSCOPIC     Status: Abnormal   Collection Time    04/02/14 11:39 PM      Result Value Ref Range   Color, Urine AMBER (*) YELLOW   APPearance TURBID (*) CLEAR   Specific Gravity, Urine 1.021  1.005 - 1.030   pH 5.0  5.0 - 8.0   Glucose, UA NEGATIVE  NEGATIVE mg/dL   Hgb urine dipstick LARGE (*) NEGATIVE   Bilirubin Urine NEGATIVE  NEGATIVE   Ketones, ur 15 (*) NEGATIVE mg/dL   Protein, ur 100 (*) NEGATIVE mg/dL   Urobilinogen, UA 1.0  0.0 - 1.0 mg/dL   Nitrite POSITIVE (*) NEGATIVE   Leukocytes, UA LARGE (*) NEGATIVE  URINE MICROSCOPIC-ADD ON     Status: Abnormal   Collection Time    04/02/14 11:39 PM      Result Value Ref Range   Squamous Epithelial / LPF FEW (*) RARE   WBC, UA TOO NUMEROUS TO COUNT  <3 WBC/hpf   RBC / HPF 11-20  <3 RBC/hpf   Bacteria, UA MANY (*) RARE   Casts HYALINE CASTS (*) NEGATIVE   Urine-Other MUCOUS PRESENT    CK TOTAL AND CKMB     Status: Abnormal   Collection Time    04/03/14 12:14 AM  Result Value Ref Range   Total CK 14[address] (*) 7 - 232 U/L   CK, MB 216.2 (*) 0.3 - 4.0 ng/mL   Relative Index 1.5  0.0 - 2.5  I-STAT TROPOININ, ED     Status: None   Collection Time    04/03/14 12:23 AM      Result Value Ref Range   Troponin i, poc 0.06  0.00 - 0.08 ng/mL   Comment 3           I-STAT CG4 LACTIC ACID, ED     Status: None   Collection Time    04/03/14 12:25 AM      Result Value Ref Range   Lactic Acid, Venous 2.16  0.5 - 2.2 mmol/L  CK     Status: Abnormal   Collection Time    04/03/14  5:08 AM      Result Value Ref Range   Total CK 9538 (*) 7 - 232 U/L  TROPONIN I     Status: None   Collection Time    04/03/14  5:08 AM      Result Value Ref Range   Troponin I <0.30  <0.30 ng/mL  BASIC METABOLIC PANEL     Status: Abnormal   Collection Time    04/03/14  5:08 AM      Result Value  Ref Range   Sodium 136 (*) 137 - 147 mEq/L   Potassium 5.9 (*) 3.7 - 5.3 mEq/L   Chloride 101  96 - 112 mEq/L   CO2 23  19 - 32 mEq/L   Glucose, Bld 187 (*) 70 - 99 mg/dL   BUN 50 (*) 6 - 23 mg/dL   Creatinine, Ser 1.82 (*) 0.50 - 1.35 mg/dL   Calcium 9.2  8.4 - 10.5 mg/dL   GFR calc non Af Amer 33 (*) >90 mL/min   GFR calc Af Amer 38 (*) >90 mL/min  CBC     Status: Abnormal   Collection Time    04/03/14  5:08 AM      Result Value Ref Range   WBC 20.3 (*) 4.0 - 10.5 K/uL   RBC 4.77  4.22 - 5.81 MIL/uL   Hemoglobin 14.9  13.0 - 17.0 g/dL   HCT 43.2  39.0 - 52.0 %   MCV 90.6  78.0 - 100.0 fL   MCH 31.2  26.0 - 34.0 pg   MCHC 34.5  30.0 - 36.0 g/dL   RDW 13.4  11.5 - 15.5 %   Platelets 50 (*) 150 - 400 K/uL  TROPONIN I     Status: None   Collection Time    04/03/14 11:58 AM      Result Value Ref Range   Troponin I <0.30  <0.30 ng/mL   IMAGING: Dg Chest Portable 1 View  04/03/2014   CLINICAL DATA:  FALL  EXAM: PORTABLE CHEST - 1 VIEW  COMPARISON:  DG CHEST 2 VIEW dated 12/29/2011  FINDINGS: Cardiac silhouette is enlarged. Patient is status post median sternotomy. There is minimal blunting of the left costophrenic angle. Lungs are clear. No acute osseous abnormalities.  IMPRESSION: Scarring versus small effusion left costophrenic angle. No acute cardiopulmonary disease.   Electronically Signed   By: Margaree Mackintosh M.D.   On: 04/03/2014 00:53   Head CT - no acute IC process. Admit EKG: sinus tach 101bpm with PVCs no acute changes Current EKG: atrial flutter vs coarse AF with RVR 140bpm nonspecific ST-T changes  IMPRESSION: 1. Syncope, suspect  due to volume depletion 2. Paroxysmal atrial fibrillation (post-op MVR 2012, now recurred) with RVR 3. Urinary tract infection 3. Rhabdomyolysis 4. Acute kidney injury 5. MVP/MR s/p complex valvuloplasty/ring annuloplasty in 2012 6. Chronic thrombocytopenia 7. Hyperglycemia 8. Hyperkalemia  RECOMMENDATION: - Start IV diltiazem bolus  and drip for HR control - Start heparin per pharmacy with plan to eventually begin Coumadin (not a candidate for NOAC given valvular disease) - CHADSVASC score is 3 but check A1C to assess for DM for possible 4th point - Agree with IV fluid resuscitation - Check 2D echo when HR is slower to assess for anything that may have contributed to syncope - Check thyroid function - Will recheck potassium level given elevation on this AM's labs - Stop aspirin (normal cors 2012, no chest pain, being put on anticoag)  - With recent sweats last week and thrombocytopenia, consider heme-onc eval  Dayna Dunn PA-C, patient seen in conjunction with Glenetta Hew, M.D., M.S.  I seen and examined the patient along with Melina Copa PA-C. She essentially acts as described for me.  The above history, examination and recommendations were performed by me with  Mrs. Dunn serving as a Education administrator.  We will need to discuss thoughts on anticoagulation with Dr. Mare Ferrari.   The cause of his syncope is most likely related to postural hypotension in the setting of UTI.  With ongoing infectious etiology, HR control in Afib is quite difficult.   For now, using CCB is reasonable, but may require DCCV (+/ TEE) - but may need antiarrythmic.     [facility] InPt Service will follow.

## 2014-04-03 NOTE — Progress Notes (Signed)
Pt c/o of sweating this am. No CP or SOB. [name], RN

## 2014-04-03 NOTE — Progress Notes (Signed)
Dr. Daleen Bo paged about pt's hr 114-130. But dr Daleen Bo says this isnt his patient.  Will find out who is covering.

## 2014-04-03 NOTE — Progress Notes (Signed)
Occupational Therapy Evaluation Patient Details Name: [name] [name] MRN: [id number] DOB: [birth year] Today's Date: 04/03/2014    History of Present Illness Pt admitted after syncopal episode (unknown downtime) with UTI, chronic thrombocytopenia. PMH significant for CABG, Afib, peripheral neuropathy.   Clinical Impression   PTA, pt lived alone and was independent with all ADL and mobility. Eval limited due to HR increased to 140s-150s. Pt states his daughter should be able to provide assistance after D/C. At this time, rec initial 24/7 S for a few days after D/C then intermittent S. Will follow acutely to facilitate D/C home with [facility] recommendations.     Follow Up Recommendations  No OT follow up;Supervision - Intermittent    Equipment Recommendations  None recommended by OT    Recommendations for Other Services       Precautions / Restrictions Precautions Precautions: Fall      Mobility Bed Mobility Overal bed mobility: Modified Independent                Transfers Overall transfer level: Modified independent               General transfer comment: Pt not trasnferred OOB due to HR increasing to 150s    Balance Overall balance assessment: Needs assistance;History of Falls   Sitting balance-Leahy Scale: Good       Standing balance-Leahy Scale: Fair                              ADL Overall ADL's : Needs assistance/impaired Eating/Feeding: Modified independent   Grooming: Set up   Upper Body Bathing: Min guard   Lower Body Bathing: Moderate assistance;Sit to/from stand   Upper Body Dressing : Minimal assistance   Lower Body Dressing: Moderate assistance;Sit to/from stand               Functional mobility during ADLs: Min guard General ADL Comments: NT just finished assistign pt with bathing. Assistedwith LB ADL. SOB with self care     Vision                 Additional Comments: "corneal scarring"   Perception      Praxis      Pertinent Vitals/Pain HR 140s-150s     Hand Dominance Right   Extremity/Trunk Assessment Upper Extremity Assessment Upper Extremity Assessment: Overall WFL for tasks assessed   Lower Extremity Assessment Lower Extremity Assessment: Overall WFL for tasks assessed   Cervical / Trunk Assessment Cervical / Trunk Assessment: Normal   Communication Communication Communication: No difficulties   Cognition Arousal/Alertness: Awake/alert Behavior During Therapy: WFL for tasks assessed/performed Overall Cognitive Status: Within Functional Limits for tasks assessed                     General Comments       Exercises       Shoulder Instructions      Home Living Family/patient expects to be discharged to:: Private residence Living Arrangements: Alone Available Help at Discharge: Family;Available PRN/intermittently Type of Home: House Home Access: Stairs to enter [employer] of Steps: 2 Entrance Stairs-Rails: None Home Layout: Two level;Bed/bath upstairs;1/2 bath on main level Alternate Level Stairs-Number of Steps: 14 Alternate Level Stairs-Rails: Right Bathroom Shower/Tub: Occupational psychologist: Standard Bathroom Accessibility: Yes How Accessible: Accessible via walker Home Equipment: Cane - quad;Shower seat - built in;Grab bars - tub/shower  Prior Functioning/Environment Level of Independence: Independent             OT Diagnosis: Generalized weakness   OT Problem List: Decreased activity tolerance;Decreased knowledge of use of DME or AE;Decreased safety awareness;Cardiopulmonary status limiting activity   OT Treatment/Interventions: Self-care/ADL training;Therapeutic exercise;DME and/or AE instruction;Therapeutic activities;Patient/family education    OT Goals(Current goals can be found in the care plan section) Acute Rehab OT Goals Patient Stated Goal: return home and watch movies OT Goal Formulation:  With patient Time For Goal Achievement: 04/17/14 Potential to Achieve Goals: Good  OT Frequency: Min 2X/week   Barriers to D/C:            Co-evaluation              End of Session Nurse Communication: Mobility status;Other (comment) (nsg aware of HR)  Activity Tolerance: Treatment limited secondary to medical complications (Comment) (HR increased to 150s. Pt reports feeling his heart racing) Patient left: in bed;with call bell/phone within reach   Time: 1145-1202 OT Time Calculation (min): 17 min Charges:  OT General Charges $OT Visit: 1 Procedure OT Evaluation $Initial OT Evaluation Tier I: 1 Procedure G-Codes:    Roney Jaffe Madinah Quarry 04/06/14, 12:15 PM   [facility], OTR/L  [phone] 04/06/14

## 2014-04-04 DIAGNOSIS — K219 Gastro-esophageal reflux disease without esophagitis: Secondary | ICD-10-CM

## 2014-04-04 DIAGNOSIS — Z9889 Other specified postprocedural states: Secondary | ICD-10-CM

## 2014-04-04 LAB — BASIC METABOLIC PANEL
BUN: 36 mg/dL — AB (ref 6–23)
CHLORIDE: 103 meq/L (ref 96–112)
CO2: 24 mEq/L (ref 19–32)
CREATININE: 1.17 mg/dL (ref 0.50–1.35)
Calcium: 8.9 mg/dL (ref 8.4–10.5)
GFR calc non Af Amer: 57 mL/min — ABNORMAL LOW (ref >90)
GFR, EST AFRICAN AMERICAN: 66 mL/min — AB (ref >90)
GLUCOSE: 208 mg/dL — AB (ref 70–99)
Potassium: 5 mEq/L (ref 3.7–5.3)
Sodium: 138 mEq/L (ref 137–147)

## 2014-04-04 LAB — URINE CULTURE: Colony Count: >=100000

## 2014-04-04 LAB — CBC
HCT: 39.9 % (ref 39.0–52.0)
HEMATOCRIT: 40.3 % (ref 39.0–52.0)
HEMOGLOBIN: 14.1 g/dL (ref 13.0–17.0)
Hemoglobin: 13.8 g/dL (ref 13.0–17.0)
MCH: 31.3 pg (ref 26.0–34.0)
MCH: 31.1 pg (ref 26.0–34.0)
MCHC: 35 g/dL (ref 30.0–36.0)
MCHC: 34.6 g/dL (ref 30.0–36.0)
MCV: 89.4 fL (ref 78.0–100.0)
MCV: 89.9 fL (ref 78.0–100.0)
Platelets: 58 10*3/uL — ABNORMAL LOW (ref 150–400)
Platelets: 64 10*3/uL — ABNORMAL LOW (ref 150–400)
RBC: 4.51 MIL/uL (ref 4.22–5.81)
RBC: 4.44 MIL/uL (ref 4.22–5.81)
RDW: 13.7 % (ref 11.5–15.5)
RDW: 13.8 % (ref 11.5–15.5)
WBC: 11.7 10*3/uL — AB (ref 4.0–10.5)
WBC: 11.1 10*3/uL — ABNORMAL HIGH (ref 4.0–10.5)

## 2014-04-04 LAB — HEPARIN LEVEL (UNFRACTIONATED): Heparin Unfractionated: 0.21 IU/mL — ABNORMAL LOW (ref 0.30–0.70)

## 2014-04-04 LAB — PROTIME-INR
INR: 1.16 (ref 0.00–1.49)
Prothrombin Time: 14.6 seconds (ref 11.6–15.2)

## 2014-04-04 LAB — CK: Total CK: 1997 U/L — ABNORMAL HIGH (ref 7–232)

## 2014-04-04 MED ORDER — WARFARIN - PHARMACIST DOSING INPATIENT: Freq: Every day | Status: DC

## 2014-04-04 MED ORDER — SODIUM CHLORIDE 0.9 % IV SOLN
INTRAVENOUS | Status: DC
  Administered 2014-04-04 – 2014-04-05 (×2): via INTRAVENOUS
  Administered 2014-04-05: 1000 mL via INTRAVENOUS

## 2014-04-04 MED ORDER — DILTIAZEM HCL 90 MG PO TABS
90.0000 mg | ORAL_TABLET | Freq: Four times a day (QID) | ORAL | Status: DC
  Administered 2014-04-04 – 2014-04-05 (×4): 90 mg via ORAL
  Filled 2014-04-04 (×8): qty 1

## 2014-04-04 MED ORDER — HEPARIN BOLUS VIA INFUSION
2500.0000 [IU] | Freq: Once | INTRAVENOUS | Status: AC
  Administered 2014-04-04: 2500 [IU] via INTRAVENOUS
  Filled 2014-04-04: qty 2500

## 2014-04-04 MED ORDER — POLYETHYLENE GLYCOL 3350 17 G PO PACK
17.0000 g | PACK | Freq: Two times a day (BID) | ORAL | Status: DC | PRN
  Administered 2014-04-04: 17 g via ORAL
  Filled 2014-04-04: qty 1

## 2014-04-04 MED ORDER — WARFARIN SODIUM 5 MG PO TABS
5.0000 mg | ORAL_TABLET | Freq: Once | ORAL | Status: AC
  Administered 2014-04-04: 5 mg via ORAL
  Filled 2014-04-04: qty 1

## 2014-04-04 MED ORDER — HEPARIN BOLUS VIA INFUSION
2500.0000 [IU] | Freq: Once | INTRAVENOUS | Status: DC
  Administered 2014-04-04: 2500 [IU] via INTRAVENOUS
  Filled 2014-04-04: qty 2500

## 2014-04-04 NOTE — Progress Notes (Addendum)
ANTICOAGULATION CONSULT NOTE - Follow-up Consult  Pharmacy Consult for Heparin Indication: atrial fibrillation  No Known Allergies  Patient Measurements: Weight: 193 lb (87.544 kg) Heparin Dosing Weight:88 kg    Vital Signs: Temp: 98.5 F (36.9 C) (05/19 0500) BP: 131/67 mmHg (05/19 0500) Pulse Rate: 102 (05/19 0500)  Labs:  Recent Labs  04/02/14 2132 04/03/14 0014 04/03/14 0508 04/03/14 1158 04/03/14 2255 04/04/14 0910  HGB 15.6  --  14.9  --   --  14.1  HCT 44.5  --  43.2  --   --  40.3  PLT 54*  --  50*  --   --  58*  HEPARINUNFRC  --   --   --   --  <0.10* <0.10*  CREATININE 2.12*  --  1.82*  --   --  1.17  CKTOTAL  --  14873* 9538*  --   --   --   CKMB  --  216.2*  --   --   --   --   TROPONINI  --   --  <0.30 <0.30  --   --     The CrCl is unknown because both a height and weight (above a minimum accepted value) are required for this calculation.  Assessment:  80 y.o. male on heparin for afib. Heparin level remains undetectable on 1350 units/hr. No bleeding per RN.  Per RN site did infiltrate and drip was moved to another site but not off for very long (<75min).   Patient with chronic thrombocytopenia. Platelets are 58 (increased today).  10/07/2011: Patient did have +HIT test but negative SRA, therefore HIT negative.   Goal of Therapy:  Heparin level 0.3-0.7 units/ml Monitor platelets by anticoagulation protocol: Yes   Plan:  Increase Heparin infusion to 1650 units/hr (no bolus due to TTP). Check heparin level in 8 hrs.  Sloan Leiter, PharmD, BCPS Clinical Pharmacist [phone]  04/04/2014,11:03 AM   Addendum: Now adding Coumadin for afib- overlap day # 1.  Patient was not on Coumadin PTA.   Plan: Baseline INR per protocol now.  Coumadin 5 mg po x1 tonight. Daily PT/INR.  Monitor platelets closely in setting of chronic TTP (rising).   1:09 PM, 04/04/2014  Addendum: Baseline INR wnl. Continue with above plan. 04/04/2014, 2:50 PM

## 2014-04-04 NOTE — Progress Notes (Signed)
ANTICOAGULATION CONSULT NOTE - Follow-up Consult  Pharmacy Consult for Heparin Indication: atrial fibrillation  No Known Allergies  Patient Measurements: Weight: 193 lb (87.544 kg) Heparin Dosing Weight:88 kg    Vital Signs: Temp: 98.5 F (36.9 C) (05/19 2100) Temp src: Oral (05/19 2100) BP: 127/69 mmHg (05/19 2100) Pulse Rate: 79 (05/19 2100)  Labs:  Recent Labs  04/02/14 2132 04/03/14 0014 04/03/14 0508 04/03/14 1158 04/03/14 2255 04/04/14 0910 04/04/14 1340 04/04/14 1940  HGB 15.6  --  14.9  --   --  14.1  --  13.8  HCT 44.5  --  43.2  --   --  40.3  --  39.9  PLT 54*  --  50*  --   --  58*  --  64*  LABPROT  --   --   --   --   --   --  14.6  --   INR  --   --   --   --   --   --  1.16  --   HEPARINUNFRC  --   --   --   --  <0.10* <0.10*  --  0.21*  CREATININE 2.12*  --  1.82*  --   --  1.17  --   --   CKTOTAL  --  14873* 9538*  --   --   --   --  1997*  CKMB  --  216.2*  --   --   --   --   --   --   TROPONINI  --   --  <0.30 <0.30  --   --   --   --     The CrCl is unknown because both a height and weight (above a minimum accepted value) are required for this calculation.  Assessment:  80 y.o. male on heparin for afib. Heparin level is subtherapeutic (0.21) on 1650 units/hr.   Patient with chronic thrombocytopenia. Platelets are 58 (increased today).  10/07/2011: Patient did have +HIT test but negative SRA, therefore HIT negative.   Goal of Therapy:  Heparin level 0.3-0.7 units/ml Monitor platelets by anticoagulation protocol: Yes   Plan:  Increase Heparin infusion to 1900 units/hr (no bolus due to TTP). Check heparin level in AM  Heide Guile, PharmD, [facility] Clinical Pharmacist Pager [phone]   04/04/2014,9:35 PM

## 2014-04-04 NOTE — Progress Notes (Addendum)
Patient: [name] [name] / Admit Date: 04/02/2014 / Date of Encounter: 04/04/2014, 9:45 AM  Subjective  Feels "so-so." Can feel his heart beating irregularly and it is bothersome. Yesterday when it was going 160s, however, he was unable to tell. He says sometimes he can feel it and sometimes he can't. Denies. CP, SOB, LEE or dizziness.  Objective   Telemetry: AF rates 90s-110s, occasionally 120s-130s  Physical Exam: Blood pressure 131/67, pulse 102, temperature 98.5 F (36.9 C), temperature source Oral, resp. rate 18, weight 193 lb (87.544 kg), SpO2 95.00%. General appearance: alert, cooperative and no distress Neck: no adenopathy, no carotid bruit, supple, symmetrical, JVD less pronounced  Lungs: clear to auscultation bilaterally  Heart: irregularly irregular rhythm, tachycardic, no M/R/G  Abdomen: soft, non-tender; bowel sounds normal; no masses, no organomegaly  Extremities: extremities normal, atraumatic, no cyanosis or edema  Pulses: 2+ and symmetric  Neurologic: No focal neurologic deficit except hard of hearing   Intake/Output Summary (Last 24 hours) at 04/04/14 0945 Last data filed at 04/04/14 0852  Gross per 24 hour  Intake   1153 ml  Output   1905 ml  Net   -752 ml    Inpatient Medications:  . cefTRIAXone (ROCEPHIN)  IV  1 g Intravenous QHS  . cycloSPORINE  1 drop Both Eyes Q12H  . lactose free nutrition  1 Container Oral Q1500  . metoprolol succinate  50 mg Oral Daily  . multivitamin with minerals  1 tablet Oral Daily  . sodium chloride  3 mL Intravenous Q12H  . traZODone  300 mg Oral QHS  . vitamin C  500 mg Oral BID   Infusions:  . sodium chloride 100 mL/hr at 04/03/14 0253  . diltiazem (CARDIZEM) infusion 10 mg/hr (04/04/14 0645)  . heparin 1,350 Units/hr (04/04/14 0940)    Labs:  Recent Labs  04/02/14 2132 04/03/14 0508 04/03/14 1425  NA 139 136*  --   K 5.3 5.9* 5.8*  CL 102 101  --   CO2 23 23  --   GLUCOSE 111* 187*  --   BUN 51* 50*  --     CREATININE 2.12* 1.82*  --   CALCIUM 9.6 9.2  --    No results found for this basename: AST, ALT, ALKPHOS, BILITOT, PROT, ALBUMIN,  in the last 72 hours  Recent Labs  04/02/14 2132 04/03/14 0508  WBC 25.9* 20.3*  HGB 15.6 14.9  HCT 44.5 43.2  MCV 90.4 90.6  PLT 54* 50*    Recent Labs  04/03/14 0014 04/03/14 0508 04/03/14 1158  CKTOTAL 31517* 9538*  --   CKMB 216.2*  --   --   TROPONINI  --  <0.30 <0.30   No components found with this basename: POCBNP,   Recent Labs  04/03/14 1425  HGBA1C 6.0*     Radiology/Studies:  Ct Head Wo Contrast  04/03/2014   CLINICAL DATA:  FALL  EXAM: CT HEAD WITHOUT CONTRAST  TECHNIQUE: Contiguous axial images were obtained from the base of the skull through the vertex without intravenous contrast.  COMPARISON:  None.  FINDINGS: No acute intracranial abnormality. Specifically, no hemorrhage, hydrocephalus, mass lesion, acute infarction, or significant intracranial injury. No acute calvarial abnormality. Mild age appropriate global atrophy. The visualized paranasal sinuses and mastoid air cells are patent.  IMPRESSION: No acute intracranial abnormality.   Electronically Signed   By: Margaree Mackintosh M.D.   On: 04/03/2014 01:19   Dg Chest Portable 1 View  04/03/2014   CLINICAL DATA:  FALL  EXAM: PORTABLE CHEST - 1 VIEW  COMPARISON:  DG CHEST 2 VIEW dated 12/29/2011  FINDINGS: Cardiac silhouette is enlarged. Patient is status post median sternotomy. There is minimal blunting of the left costophrenic angle. Lungs are clear. No acute osseous abnormalities.  IMPRESSION: Scarring versus small effusion left costophrenic angle. No acute cardiopulmonary disease.   Electronically Signed   By: Margaree Mackintosh M.D.   On: 04/03/2014 00:53     Assessment and Plan  1. Syncope, suspect due to volume depletion/orthostasis in setting of UTI - consider outpatient event monitor to exclude post-termination pauses.  2. Paroxysmal atrial fibrillation (post-op MVR 2012, now  recurred) with RVR, TSH wnl - rates better on IV diltiazem but still room for improvement. Placed on IV heparin yesterday. He was not entirely aware of his heart rhythm yesterday even while going 160s so outpatient paroxysms were possible. He can feel it this AM. Will discuss strategy for TEE/DCCV vs conversion to oral diltiazem with MD. Note that repeat K is in process - this was deferred to IM yesterday. Would need to correct this before expecting him to hold NSR.  3. Urinary tract infection - on abx. 3. Rhabdomyolysis - improving, s/p IV fluid resuscitation. 4. Acute kidney injury (normal renal function at baseline) - BMET this AM. 5. 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 2D echo: paradoxical septal motion, mod LVH, EF 50%, MV repair in tact, mild stenosis as noted before, mildly dilated RV and mildly reduced systolic RV function, RA mildly dilated. 6. Chronic thrombocytopenia - Plt count generally lower than prior values but remains stable. With recent sweats last week and chronic thrombocytopenia, consider heme-onc eval (will discuss timing with MD). Aspirin has been stopped (normal cors 2012, no chest pain, being put on anticoag).  7. Hyperglycemia - A1c 6.0. 8. Hyperkalemia - repeat BMET pending this AM. Per IM in setting of renal insufficiency.  Signed, Melina Copa PA-C  I have examined the patient and reviewed assessment and plan and discussed with patient.  Agree with above as stated.  Increasing meds to improve rate control.  Agree with need for anticoagulation.  Hydration for rhabdo. ABx for UTI.  WBC decreased today.  Overall, he is doing better.  If he is symptomatic with his AFib when he walks, after he recovers somewhat from the medical issues he is having, would plan for TEE/CV.    [name]  ADDENDUM: D/w MD. Medical issues are headed in the right direction so will give pt a little more time to possibly convert on his own. Will transition dilt  to oral - 90mg  Q6hr (told nurse to turn off drip 1 hr after 1st dose) and add Coumadin bridge. Will keep NPO after midnight in case we consider TEE/DCCV as inpatient for tomorrow. Dayna Dunn PA-C

## 2014-04-04 NOTE — Progress Notes (Signed)
Pt ambulated in hallway with out difficulty. HR Afib 100. Carroll Kinds RN

## 2014-04-04 NOTE — Care Management Note (Addendum)
    Page 1 of 2   04/06/2014     10:32:42 AM CARE MANAGEMENT NOTE 04/06/2014  Patient:  [name] [name]   Account Number:  [id number]  Date Initiated:  04/04/2014  Documentation initiated by:  GRAVES-BIGELOW,Montford Barg  Subjective/Objective Assessment:   Pt admitted for syncope and fall.     Action/Plan:   CM will continue to monitor for disposition needs.   Anticipated DC Date:  04/06/2014   Anticipated DC Plan:  [place] [facility]  CM consult      [facility] Choice  HOME HEALTH  DURABLE MEDICAL EQUIPMENT   Choice offered to / List presented to:  C-1 Patient   DME arranged  Vassie Moselle      DME agency  [place] arranged  [place].   Status of service:  Completed, signed off Medicare Important Message given?  YES (If response is "NO", the following Medicare IM given date fields will be blank) Date Medicare IM given:  04/06/2014 Date Additional Medicare IM given:    Discharge Disposition:  [place]  Per UR Regulation:  Reviewed for med. necessity/level of care/duration of stay  If discussed at [place] of Stay Meetings, dates discussed:    Comments:  04-06-14 [address] Jacqlyn Krauss, RN,BSN [phone] CM did provide pt with IM and he signed. Copy placed on chart. CM made referral with [facility] for [facility] services. SOC to begin within 24-48 hrs post d/c.

## 2014-04-04 NOTE — Progress Notes (Signed)
ANTICOAGULATION CONSULT NOTE - Follow-up Consult  Pharmacy Consult for Heparin Indication: atrial fibrillation  No Known Allergies  Patient Measurements:   Heparin Dosing Weight:88 kg    Vital Signs: Temp: 97.8 F (36.6 C) (05/18 2130) Temp src: Oral (05/18 2130) BP: 125/65 mmHg (05/18 2130) Pulse Rate: 102 (05/18 2130)  Labs:  Recent Labs  04/02/14 2132 04/03/14 0014 04/03/14 0508 04/03/14 1158 04/03/14 2255  HGB 15.6  --  14.9  --   --   HCT 44.5  --  43.2  --   --   PLT 54*  --  50*  --   --   HEPARINUNFRC  --   --   --   --  <0.10*  CREATININE 2.12*  --  1.82*  --   --   CKTOTAL  --  14873* 9538*  --   --   CKMB  --  216.2*  --   --   --   TROPONINI  --   --  <0.30 <0.30  --     The CrCl is unknown because both a height and weight (above a minimum accepted value) are required for this calculation.  Assessment:  80 y.o. male on heparin for afib. Heparin level undetectable on 1150 units/hr. No bleeding noted. No issues with line per RN.   10/07/2011: Patient did have +HIT test but negative SRA, therefore HIT negative.   Goal of Therapy:  Heparin level 0.3-0.7 units/ml Monitor platelets by anticoagulation protocol: Yes   Plan:  1) Rebolus Heparin 2500 unit IV bolus 2) Increase Heparin infusion to 1350 units/hr 3) Check heparin level in 8 hrs   Sherlon Handing, PharmD, BCPS Clinical pharmacist, pager [phone] 04/04/2014,12:13 AM

## 2014-04-04 NOTE — Progress Notes (Signed)
[facility] PROGRESS NOTE  MAYSON MCNEISH SWN:[id number] DOB: September 21, 1931 DOA: 04/02/2014 PCP: Penni Homans, MD  Assessment/Plan:  Syncope -Troponins negative x3 -Obtain orthostatic vitals, continue hydration>> follow and recheck  -Appreciate cardiology input, to follow and consider outpatient event monitor to exclude post-termination pulses -Echo with EF 50% and moderate LVH Acute renal failure  -Multifactorial, likely prerenal in the setting of UTI and NSAID use.  -Resolved with hydration, and off  furosemide and Celebrex .  Rhabdomyolysis  Likely due to patient being on the floor for unknown period of time.  -Recheck CK, continue hydration  Urinary tract infection, Escherichia coli  -Continue Rocephin  Generalized weakness  Requests physical therapy and occupational therapy evaluation.  Leukocytosis  -Much improved with treatment of UTI  Paroxysmal atrial fibrillation -Controlled on IV Cardizem, TSH within normal limits -On IV heparin, cardiology following for further recommendations Resolved. Not on any anticoagulation.  Hypertension  Better controlled on current meds.  Hypercholesterolemia  Holding statin for now due to rhabdomyolysis.  Thrombocytopenia  Chronic.Uncertain etiology. Follow and recheck in a.m.  Depression  Stable. Continue home medications.  GERD  Stable.   Code Status: Full Family Communication: None at bedside Disposition Plan: Pending clinical course   Consultants:  Cardiology  Procedures:  Echocardiogram Study Conclusions  - Left ventricle: Paradoxical septal motion. The cavity size was normal. Wall thickness was increased in a pattern of moderate LVH. The estimated ejection fraction was 50%. - Mitral valve: Mitral valve repair is intact. There is mild stenosis as noted before. - Right ventricle: The cavity size was mildly dilated. Systolic function was mildly reduced. - Right atrium: The atrium was mildly dilated. -  Impressions: No obvious change from the prior study.  Impressions:  - No obvious change from the prior study.   Antibiotics:  Rocephin 5/18>>  HPI/Subjective: States he feels much better today, denies chest pain and shortness of breath.  Objective: Filed Vitals:   04/04/14 0500  BP: 131/67  Pulse: 102  Temp: 98.5 F (36.9 C)  Resp: 18    Intake/Output Summary (Last 24 hours) at 04/04/14 1147 Last data filed at 04/04/14 1007  Gross per 24 hour  Intake    843 ml  Output   1905 ml  Net  -1062 ml   Filed Weights   04/04/14 0500  Weight: 87.544 kg (193 lb)    Exam:  General: alert & oriented x 3 In NAD Cardiovascular: Regular, rate controlled, nl S1 s2 Respiratory: CTAB Abdomen: soft +BS NT/ND, no masses palpable Extremities: No cyanosis and no edema    Data Reviewed: Basic Metabolic Panel:  Recent Labs Lab 04/02/14 2132 04/03/14 0508 04/03/14 1425 04/04/14 0910  NA 139 136*  --  138  K 5.3 5.9* 5.8* 5.0  CL 102 101  --  103  CO2 23 23  --  24  GLUCOSE 111* 187*  --  208*  BUN 51* 50*  --  36*  CREATININE 2.12* 1.82*  --  1.17  CALCIUM 9.6 9.2  --  8.9   Liver Function Tests: No results found for this basename: AST, ALT, ALKPHOS, BILITOT, PROT, ALBUMIN,  in the last 168 hours No results found for this basename: LIPASE, AMYLASE,  in the last 168 hours No results found for this basename: AMMONIA,  in the last 168 hours CBC:  Recent Labs Lab 04/02/14 2132 04/03/14 0508 04/04/14 0910  WBC 25.9* 20.3* 11.7*  HGB 15.6 14.9 14.1  HCT 44.5 43.2 40.3  MCV 90.4  90.6 89.4  PLT 54* 50* 58*   Cardiac Enzymes:  Recent Labs Lab 04/03/14 0014 04/03/14 0508 04/03/14 1158  CKTOTAL 70488* 9538*  --   CKMB 216.2*  --   --   TROPONINI  --  <0.30 <0.30   BNP (last 3 results) No results found for this basename: PROBNP,  in the last 8760 hours CBG: No results found for this basename: GLUCAP,  in the last 168 hours  Recent Results (from the past 240  hour(s))  URINE CULTURE     Status: None   Collection Time    04/02/14 11:39 PM      Result Value Ref Range Status   Specimen Description URINE, CLEAN CATCH   Final   Special Requests ADDED 0105 04/03/14   Final   Culture  Setup Time     Final   Value: 04/03/2014 01:17     Performed at [facility]     Final   Value: >=100,000 COLONIES/ML     Performed at [facility]   Culture     Final   Value: ESCHERICHIA COLI     Performed at [facility]   Report Status PENDING   Incomplete     Studies: Ct Head Wo Contrast  04/03/2014   CLINICAL DATA:  FALL  EXAM: CT HEAD WITHOUT CONTRAST  TECHNIQUE: Contiguous axial images were obtained from the base of the skull through the vertex without intravenous contrast.  COMPARISON:  None.  FINDINGS: No acute intracranial abnormality. Specifically, no hemorrhage, hydrocephalus, mass lesion, acute infarction, or significant intracranial injury. No acute calvarial abnormality. Mild age appropriate global atrophy. The visualized paranasal sinuses and mastoid air cells are patent.  IMPRESSION: No acute intracranial abnormality.   Electronically Signed   By: Margaree Mackintosh M.D.   On: 04/03/2014 01:19   Dg Chest Portable 1 View  04/03/2014   CLINICAL DATA:  FALL  EXAM: PORTABLE CHEST - 1 VIEW  COMPARISON:  DG CHEST 2 VIEW dated 12/29/2011  FINDINGS: Cardiac silhouette is enlarged. Patient is status post median sternotomy. There is minimal blunting of the left costophrenic angle. Lungs are clear. No acute osseous abnormalities.  IMPRESSION: Scarring versus small effusion left costophrenic angle. No acute cardiopulmonary disease.   Electronically Signed   By: Margaree Mackintosh M.D.   On: 04/03/2014 00:53    Scheduled Meds: . cefTRIAXone (ROCEPHIN)  IV  1 g Intravenous QHS  . cycloSPORINE  1 drop Both Eyes Q12H  . diltiazem  90 mg Oral 4 times per day  . lactose free nutrition  1 Container Oral Q1500  . metoprolol succinate  50  mg Oral Daily  . multivitamin with minerals  1 tablet Oral Daily  . sodium chloride  3 mL Intravenous Q12H  . traZODone  300 mg Oral QHS  . vitamin C  500 mg Oral BID   Continuous Infusions: . sodium chloride 100 mL/hr at 04/03/14 0253  . heparin 1,650 Units/hr (04/04/14 1131)    Principal Problem:   Syncope Active Problems:   HYPERCHOLESTEROLEMIA   THROMBOCYTOPENIA   DEPRESSION   HYPERTENSION, BENIGN ESSENTIAL   GERD   BACK PAIN, CHRONIC   Atrial fibrillation   Rhabdomyolysis   UTI (urinary tract infection)   Acute renal failure   Leukocytosis    Time spent:>35    Sheila Oats  [facility] Pager [phone]. If 7PM-7AM, please contact night-coverage at [email or web address], password [facility] 04/04/2014, 11:47 AM  LOS: 2  days

## 2014-04-04 NOTE — Progress Notes (Signed)
Utilization review completed.  

## 2014-04-05 ENCOUNTER — Other Ambulatory Visit: Payer: Self-pay | Admitting: Physician Assistant

## 2014-04-05 DIAGNOSIS — M549 Dorsalgia, unspecified: Secondary | ICD-10-CM

## 2014-04-05 DIAGNOSIS — R55 Syncope and collapse: Secondary | ICD-10-CM

## 2014-04-05 LAB — CBC
HEMATOCRIT: 38.9 % — AB (ref 39.0–52.0)
HEMOGLOBIN: 13.5 g/dL (ref 13.0–17.0)
MCH: 31 pg (ref 26.0–34.0)
MCHC: 34.7 g/dL (ref 30.0–36.0)
MCV: 89.2 fL (ref 78.0–100.0)
Platelets: 63 10*3/uL — ABNORMAL LOW (ref 150–400)
RBC: 4.36 MIL/uL (ref 4.22–5.81)
RDW: 13.8 % (ref 11.5–15.5)
WBC: 8.6 10*3/uL (ref 4.0–10.5)

## 2014-04-05 LAB — BASIC METABOLIC PANEL
BUN: 25 mg/dL — ABNORMAL HIGH (ref 6–23)
CO2: 22 mEq/L (ref 19–32)
Calcium: 8.4 mg/dL (ref 8.4–10.5)
Chloride: 106 mEq/L (ref 96–112)
Creatinine, Ser: 0.94 mg/dL (ref 0.50–1.35)
GFR calc Af Amer: 88 mL/min — ABNORMAL LOW (ref >90)
GFR calc non Af Amer: 76 mL/min — ABNORMAL LOW (ref >90)
GLUCOSE: 134 mg/dL — AB (ref 70–99)
POTASSIUM: 3.9 meq/L (ref 3.7–5.3)
Sodium: 139 mEq/L (ref 137–147)

## 2014-04-05 LAB — ANTITHROMBIN III: ANTITHROMB III FUNC: 69 % — AB (ref 75–120)

## 2014-04-05 LAB — HEPARIN LEVEL (UNFRACTIONATED)
HEPARIN UNFRACTIONATED: 0.1 [IU]/mL — AB (ref 0.30–0.70)
Heparin Unfractionated: 0.41 IU/mL (ref 0.30–0.70)

## 2014-04-05 LAB — CK: CK TOTAL: 1069 U/L — AB (ref 7–232)

## 2014-04-05 LAB — PROTIME-INR
INR: 1.17 (ref 0.00–1.49)
Prothrombin Time: 14.7 seconds (ref 11.6–15.2)

## 2014-04-05 MED ORDER — DILTIAZEM HCL ER COATED BEADS 300 MG PO CP24
300.0000 mg | ORAL_CAPSULE | Freq: Every day | ORAL | Status: DC
  Administered 2014-04-05 – 2014-04-06 (×2): 300 mg via ORAL
  Filled 2014-04-05 (×2): qty 1

## 2014-04-05 MED ORDER — BISACODYL 5 MG PO TBEC
10.0000 mg | DELAYED_RELEASE_TABLET | Freq: Every day | ORAL | Status: DC | PRN
  Administered 2014-04-05: 10 mg via ORAL
  Filled 2014-04-05: qty 2

## 2014-04-05 MED ORDER — CEPHALEXIN 500 MG PO CAPS
500.0000 mg | ORAL_CAPSULE | Freq: Two times a day (BID) | ORAL | Status: DC
  Administered 2014-04-05 – 2014-04-06 (×3): 500 mg via ORAL
  Filled 2014-04-05 (×4): qty 1

## 2014-04-05 MED ORDER — WARFARIN SODIUM 7.5 MG PO TABS
7.5000 mg | ORAL_TABLET | Freq: Once | ORAL | Status: AC
  Administered 2014-04-05: 7.5 mg via ORAL
  Filled 2014-04-05: qty 1

## 2014-04-05 MED ORDER — HEPARIN BOLUS VIA INFUSION
2500.0000 [IU] | Freq: Once | INTRAVENOUS | Status: AC
  Administered 2014-04-05: 2500 [IU] via INTRAVENOUS
  Filled 2014-04-05: qty 2500

## 2014-04-05 NOTE — Progress Notes (Addendum)
ANTICOAGULATION CONSULT NOTE - Follow-up Consult  Pharmacy Consult for Heparin Indication: atrial fibrillation  No Known Allergies  Patient Measurements: Height: 6\' 5"  (195.6 cm) Weight: 197 lb 5 oz (89.5 kg) IBW/kg (Calculated) : 89.1 Heparin Dosing Weight:88 kg    Vital Signs: Temp: 98 F (36.7 C) (05/20 1410) Temp src: Oral (05/20 1410) BP: 120/68 mmHg (05/20 1410) Pulse Rate: 76 (05/20 1410)  Labs:  Recent Labs  04/02/14 2132  04/03/14 0014 04/03/14 0508 04/03/14 1158  04/04/14 0910 04/04/14 1340 04/04/14 1940 04/05/14 0645 04/05/14 1530  HGB 15.6  --   --  14.9  --   --  14.1  --  13.8 13.5  --   HCT 44.5  --   --  43.2  --   --  40.3  --  39.9 38.9*  --   PLT 54*  --   --  50*  --   --  58*  --  64* 63*  --   LABPROT  --   --   --   --   --   --   --  14.6  --  14.7  --   INR  --   --   --   --   --   --   --  1.16  --  1.17  --   HEPARINUNFRC  --   --   --   --   --   < > <0.10*  --  0.21* 0.10* 0.41  CREATININE 2.12*  --   --  1.82*  --   --  1.17  --   --  0.94  --   CKTOTAL  --   < > 14873* 9538*  --   --   --   --  1997* 1069*  --   CKMB  --   --  216.2*  --   --   --   --   --   --   --   --   TROPONINI  --   --   --  <0.30 <0.30  --   --   --   --   --   --   < > = values in this interval not displayed.  Estimated Creatinine Clearance: 77.7 ml/min (by C-G formula based on Cr of 0.94).  Assessment:  80 y.o. male on heparin for afib. Heparin level 0.41 now in goal range. ATIII 69 (low)  Patient with chronic thrombocytopenia. Platelets are 63 (increased today).  10/07/2011: Patient did have +HIT test but negative SRA, therefore HIT negative.   Goal of Therapy:  Heparin level 0.3-0.7 units/ml Monitor platelets by anticoagulation protocol: Yes   Plan:  Continue heparin at 2200 units/hr  Daily heparin level and CBC  Raidyn Wassink S. Alford Highland, PharmD, [place] Clinical Staff Pharmacist Pager [phone]  04/05/2014,7:12 PM

## 2014-04-05 NOTE — Progress Notes (Signed)
Physical Therapy Treatment Patient Details Name: [name] [name] MRN: [id number] DOB: 01-21-1931 Today's Date: 04/05/2014    History of Present Illness Pt admitted after syncopal episode with UTI, chronic thrombocytopenia    PT Comments    Pt admitted with above. Pt currently with functional limitations due to balance and endurance deficits.  Pt should do well to d/c home with HHPT and tall RW.    Pt will benefit from skilled PT to increase their independence and safety with mobility to allow discharge to the venue listed below.   Follow Up Recommendations  Home health PT     Equipment Recommendations  Rolling walker with 5" wheels - tall   Recommendations for Other Services       Precautions / Restrictions Precautions Precautions: Fall Restrictions Weight Bearing Restrictions: No    Mobility  Bed Mobility Overal bed mobility: Modified Independent                Transfers Overall transfer level: Modified independent                  Ambulation/Gait Ambulation/Gait assistance: Supervision Ambulation Distance (Feet): 350 Feet Assistive device: Rolling walker (2 wheeled) Gait Pattern/deviations: Step-through pattern;Decreased stride length   Gait velocity interpretation: Below normal speed for age/gender General Gait Details: Pt with good steady gait with RW.  Feels like he will need a RW at home and PT agrees for safety.     Stairs            Wheelchair Mobility    Modified Rankin (Stroke Patients Only)       Balance Overall balance assessment: Needs assistance         Standing balance support: Bilateral upper extremity supported;During functional activity Standing balance-Leahy Scale: Fair Standing balance comment: Cannot accept challenges to balance but can stand without holding RW for seconds.               High level balance activites: Direction changes;Turns High Level Balance Comments: Pt supervision with RW.       Cognition Arousal/Alertness: Awake/alert Behavior During Therapy: WFL for tasks assessed/performed Overall Cognitive Status: Within Functional Limits for tasks assessed                      Exercises General Exercises - Lower Extremity Hip ABduction/ADduction: AROM;Both;5 reps;Standing Hip Flexion/Marching: AROM;Both;10 reps;Standing Mini-Sqauts: AROM;Both;5 reps;Standing    General Comments        Pertinent Vitals/Pain HR 85-97 bpm with ambulation, No pain.    Home Living                      Prior Function            PT Goals (current goals can now be found in the care plan section) Progress towards PT goals: Progressing toward goals    Frequency  Min 3X/week    PT Plan Discharge plan needs to be updated    Co-evaluation             End of Session Equipment Utilized During Treatment: Gait belt Activity Tolerance: Patient tolerated treatment well Patient left: in chair;with call bell/phone within reach (no chair alarm present on unit, RN aware and agreeable with OOB and Pt instructed for no independent mobility)     Time: 7902-4097 PT Time Calculation (min): 24 min  Charges:  $Gait Training: 8-22 mins $Therapeutic Exercise: 8-22 mins  G Codes:      Christianne Dolin 04/05/2014, 2:15 PM [address] [facility] Acute [facility] [phone] (pager)

## 2014-04-05 NOTE — Progress Notes (Signed)
Patient: [name] [name] / Admit Date: 04/02/2014 / Date of Encounter: 04/05/2014, 10:07 AM  Subjective  No complaints today. Can sometimes feel his heart beat irregularly when it goes fast. Currently denies palpitations. Denies CP, SOB, dizziness.  Objective   Telemetry:  Afib, HR 70s occ PVCs/couplets/triplets  EKG:  04/05/14 Aflutter vs coarse Afib  Physical Exam: Blood pressure 116/68, pulse 78, temperature 98.5 F (36.9 C), temperature source Oral, resp. rate 16, height 6\' 5"  (1.956 m), weight 197 lb 5 oz (89.5 kg), SpO2 94.00%. General: Well developed, well nourished, in no acute distress. Head: Normocephalic, atraumatic, sclera non-icteric, no xanthomas, nares are without discharge. Neck: Negative for carotid bruits. JVP not elevated. Lungs: Clear bilaterally to auscultation without wheezes, rales, or rhonchi. Breathing is unlabored. Heart: Irregularly irregular rhythm. S1 S2 without murmurs, rubs, or gallops.  Abdomen: Soft, non-tender, non-distended with normoactive bowel sounds. No rebound/guarding. Extremities: No clubbing or cyanosis. No edema. Distal pedal pulses are 2+ and equal bilaterally. Neuro: Alert and oriented X 3. Moves all extremities spontaneously. Psych:  Responds to questions appropriately with a normal affect.   Intake/Output Summary (Last 24 hours) at 04/05/14 1007 Last data filed at 04/05/14 0900  Gross per 24 hour  Intake    480 ml  Output   1600 ml  Net  -1120 ml    Inpatient Medications:  . cephALEXin  500 mg Oral Q12H  . cycloSPORINE  1 drop Both Eyes Q12H  . diltiazem  90 mg Oral 4 times per day  . lactose free nutrition  1 Container Oral Q1500  . metoprolol succinate  50 mg Oral Daily  . multivitamin with minerals  1 tablet Oral Daily  . sodium chloride  3 mL Intravenous Q12H  . traZODone  300 mg Oral QHS  . vitamin C  500 mg Oral BID  . warfarin  7.5 mg Oral ONCE-1800  . Warfarin - Pharmacist Dosing Inpatient   Does not apply q1800    Infusions:  . sodium chloride 1,000 mL (04/05/14 0013)  . heparin 2,200 Units/hr (04/05/14 0845)    Labs:  Recent Labs  04/04/14 0910 04/05/14 0645  NA 138 139  K 5.0 3.9  CL 103 106  CO2 24 22  GLUCOSE 208* 134*  BUN 36* 25*  CREATININE 1.17 0.94  CALCIUM 8.9 8.4   Recent Labs  04/04/14 1940 04/05/14 0645  WBC 11.1* 8.6  HGB 13.8 13.5  HCT 39.9 38.9*  MCV 89.9 89.2  PLT 64* 63*    Recent Labs  04/03/14 0014 04/03/14 0508 04/03/14 1158 04/04/14 1940 04/05/14 0645  CKTOTAL 57322* 9538*  --  1997* 1069*  CKMB 216.2*  --   --   --   --   TROPONINI  --  <0.30 <0.30  --   --     Recent Labs  04/03/14 1425  HGBA1C 6.0*     Radiology/Studies:  Ct Head Wo Contrast  04/03/2014   CLINICAL DATA:  FALL  EXAM: CT HEAD WITHOUT CONTRAST  TECHNIQUE: Contiguous axial images were obtained from the base of the skull through the vertex without intravenous contrast.  COMPARISON:  None.  FINDINGS: No acute intracranial abnormality. Specifically, no hemorrhage, hydrocephalus, mass lesion, acute infarction, or significant intracranial injury. No acute calvarial abnormality. Mild age appropriate global atrophy. The visualized paranasal sinuses and mastoid air cells are patent.  IMPRESSION: No acute intracranial abnormality.   Electronically Signed   By: Margaree Mackintosh M.D.   On: 04/03/2014 01:19  Dg Chest Portable 1 View  04/03/2014   CLINICAL DATA:  FALL  EXAM: PORTABLE CHEST - 1 VIEW  COMPARISON:  DG CHEST 2 VIEW dated 12/29/2011  FINDINGS: Cardiac silhouette is enlarged. Patient is status post median sternotomy. There is minimal blunting of the left costophrenic angle. Lungs are clear. No acute osseous abnormalities.  IMPRESSION: Scarring versus small effusion left costophrenic angle. No acute cardiopulmonary disease.   Electronically Signed   By: Margaree Mackintosh M.D.   On: 04/03/2014 00:53     Assessment and Plan  1. Syncope, suspect due to volume depletion/orthostasis in  setting of UTI - we will arrange outpatient event monitor to assess rhythm as well as for post-termination pauses.  2. Paroxysmal atrial fibrillation (post-op MVR 2012, now recurred) with RVR, TSH wnl - Rates much better today in the 70s but still in persistent Afib. Patient is asymptomatic. Will consolidate diltiazem to 300mg  daily (slightly lower dose than 90 q6 due to HR 50s-60s on tele). Continue Toprol. Does not need heparin bridge. OK to DC on Coumadin. Aspirin not needed. Please arrange close coumadin followup at dc - we are happy to follow in our clinic if requested. Just please let us know when you want this scheduled.  3. Urinary tract infection - on abx. WBC count now WNL.   3. Rhabdomyolysis - improving, s/p IV fluid resuscitation.   4. Acute kidney injury (normal renal function at baseline) - Creatinine has returned to baseline. Cr 0.94 this morning.   5. 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 2D echo: paradoxical septal motion, mod LVH, EF 50%, MV repair in tact, mild stenosis as noted before, mildly dilated RV and mildly reduced systolic RV function, RA mildly dilated.   6. Chronic thrombocytopenia - Plt count generally lower than prior values but remains stable. Aspirin has been stopped (normal cors 2012, no chest pain, being put on anticoag). With recent sweats last week and chronic thrombocytopenia, recommend primary team arrange heme-onc eval at DC.  7. Hyperglycemia - A1c 6.0. Not currently on medication.   8. Hyperkalemia - No longer elevated. K 3.9 this morning.   Rondel Baton, PA-S  Pt seen with MD and changes made above. Pt awaiting PT/OT per IM notes. OK to DC from cardiology standpoint.  Cardiology f/u arranged: Richardson Dopp 04/26/14 at 9:50am Patient should pick up 14-day event monitor tomorrow (04/06/14) at 11:30am Let us know if/when patient needs Coumadin clinic check if he elects to follow this in our office  Signed, Melina Copa PA-C  I have examined the patient and reviewed assessment and plan and discussed with patient.  Agree with above as stated.  Coumadin for stroke prevention.  Consider cardioversion in a few weeks if he does not convert on his own.  Use Cardizem CD 300 for rate control at discharge.  [name]

## 2014-04-05 NOTE — Progress Notes (Addendum)
ANTICOAGULATION CONSULT NOTE - Follow-up Consult  Pharmacy Consult for Heparin Indication: atrial fibrillation  No Known Allergies  Patient Measurements: Height: 6\' 5"  (195.6 cm) Weight: 197 lb 5 oz (89.5 kg) IBW/kg (Calculated) : 89.1 Heparin Dosing Weight:88 kg    Vital Signs: Temp: 98.5 F (36.9 C) (05/19 2100) Temp src: Oral (05/19 2100) BP: 116/68 mmHg (05/20 0551) Pulse Rate: 78 (05/20 0540)  Labs:  Recent Labs  04/02/14 2132  04/03/14 0014 04/03/14 0508 04/03/14 1158  04/04/14 0910 04/04/14 1340 04/04/14 1940 04/05/14 0645  HGB 15.6  --   --  14.9  --   --  14.1  --  13.8 13.5  HCT 44.5  --   --  43.2  --   --  40.3  --  39.9 38.9*  PLT 54*  --   --  50*  --   --  58*  --  64* 63*  LABPROT  --   --   --   --   --   --   --  14.6  --  14.7  INR  --   --   --   --   --   --   --  1.16  --  1.17  HEPARINUNFRC  --   --   --   --   --   < > <0.10*  --  0.21* 0.10*  CREATININE 2.12*  --   --  1.82*  --   --  1.17  --   --  0.94  CKTOTAL  --   < > 14873* 9538*  --   --   --   --  1997* 1069*  CKMB  --   --  216.2*  --   --   --   --   --   --   --   TROPONINI  --   --   --  <0.30 <0.30  --   --   --   --   --   < > = values in this interval not displayed.  Estimated Creatinine Clearance: 77.7 ml/min (by C-G formula based on Cr of 0.94).  Assessment:  80 y.o. male on heparin for afib. Heparin level is subtherapeutic (0.10) on 1900 units/hr. INR 1.17 (no change) after 5mg  Coumadin yesterday. No infusion issues per nurse.   Patient with chronic thrombocytopenia. Platelets are 63 (increased today).  10/07/2011: Patient did have +HIT test but negative SRA, therefore HIT negative.   Goal of Therapy:  Heparin level 0.3-0.7 units/ml Monitor platelets by anticoagulation protocol: Yes   Plan:  Bolus heparin 2500 units x1. Increase Heparin infusion to 2200 units/hr. Check heparin level in 8 hours Will check antithrombin III to assess for deficiency Coumadin 7.5 mg x1  today PT/INR daily  Sloan Leiter, PharmD, BCPS Clinical Pharmacist [phone] 04/05/2014,8:33 AM

## 2014-04-05 NOTE — Progress Notes (Signed)
PT Note Pt will need RW and HHPT f/u.  Please order if you agree.  Thanks. [facility] [phone] (pager)

## 2014-04-05 NOTE — Progress Notes (Signed)
Patient ID: [name] [name]  male  GEX:[id number]    DOB: 18-Jun-1931    DOA: 04/02/2014  PCP: Penni Homans, MD  Assessment/Plan: Principal Problem:   Syncope Active Problems:   HYPERCHOLESTEROLEMIA   THROMBOCYTOPENIA   DEPRESSION   HYPERTENSION, BENIGN ESSENTIAL   GERD   BACK PAIN, CHRONIC   Atrial fibrillation   Rhabdomyolysis   UTI (urinary tract infection)   Acute renal failure   Leukocytosis  Syncope  -Troponins negative x3  -Appreciate cardiology input, patient to pick up the outpatient event monitor, no cardioversion at this time, placed on Coumadin and plan outpatient cardioversion  -Echo with EF 50% and moderate LVH   Acute renal failure with acute rhabdomyolysis -Multifactorial, likely prerenal in the setting of UTI and NSAID use, creatinine improving.  -Resolved with hydration, and off furosemide and Celebrex .   Rhabdomyolysis  Likely due to patient being on the floor for unknown period of time.  -Recheck CK today 1069, improving, continue hydration today.  Urinary tract infection, Escherichia coli  -Rocephin discontinued, placed on Keflex for 7 days  Generalized weakness  PTOT evaluation, recommending home health PT  Leukocytosis  -Much improved with treatment of UTI   atrial fibrillation  -Controlled on IV Cardizem, TSH within normal limits  -On IV heparin, cardiology recommending starting warfarin, INR 1.1 today  Hypertension  Better controlled on current meds.   Hypercholesterolemia  Holding statin for now due to rhabdomyolysis.   Thrombocytopenia  Chronic.Uncertain etiology. Progressively improving   Depression  Stable. Continue home medications.   GERD   Stable.  DVT Prophylaxis:  Code Status:  Family Communication: Patient alert and oriented x4, discussed with the patient he prefers home health PT. Patient to pick up event monitor in a.m., wants to pick up the event monitor straight from here tomorrow morning. Will DC early  AM  Disposition:  Consultants:   cardiology  Procedures:    Antibiotics:  IV Rocephin, dc on 5/20    Subjective: Patient seen and examined, feeling somewhat better today, no chest pain or shortness of breath,  Objective: Weight change: 1.956 kg (4 lb 5 oz)  Intake/Output Summary (Last 24 hours) at 04/05/14 1208 Last data filed at 04/05/14 1116  Gross per 24 hour  Intake    480 ml  Output   1800 ml  Net  -1320 ml   Blood pressure 116/68, pulse 78, temperature 98.5 F (36.9 C), temperature source Oral, resp. rate 16, height 6\' 5"  (1.956 m), weight 89.5 kg (197 lb 5 oz), SpO2 94.00%.  Physical Exam: General: Alert and awake, oriented x3, not in any acute distress. CVS: Irregularly regular Chest: clear to auscultation bilaterally, no wheezing, rales or rhonchi Abdomen: soft nontender, nondistended, normal bowel sounds  Extremities: no cyanosis, clubbing or edema noted bilaterally Neuro: Cranial nerves II-XII intact, no focal neurological deficits  Lab Results: Basic Metabolic Panel:  Recent Labs Lab 04/04/14 0910 04/05/14 0645  NA 138 139  K 5.0 3.9  CL 103 106  CO2 24 22  GLUCOSE 208* 134*  BUN 36* 25*  CREATININE 1.17 0.94  CALCIUM 8.9 8.4   Liver Function Tests: No results found for this basename: AST, ALT, ALKPHOS, BILITOT, PROT, ALBUMIN,  in the last 168 hours No results found for this basename: LIPASE, AMYLASE,  in the last 168 hours No results found for this basename: AMMONIA,  in the last 168 hours CBC:  Recent Labs Lab 04/04/14 1940 04/05/14 0645  WBC 11.1* 8.6  HGB  13.8 13.5  HCT 39.9 38.9*  MCV 89.9 89.2  PLT 64* 63*   Cardiac Enzymes:  Recent Labs Lab 04/03/14 0014 04/03/14 0508 04/03/14 1158 04/04/14 1940 04/05/14 0645  CKTOTAL 77412* 9538*  --  1997* 1069*  CKMB 216.2*  --   --   --   --   TROPONINI  --  <0.30 <0.30  --   --    BNP: No components found with this basename: POCBNP,  CBG: No results found for this  basename: GLUCAP,  in the last 168 hours   Micro Results: Recent Results (from the past 240 hour(s))  URINE CULTURE     Status: None   Collection Time    04/02/14 11:39 PM      Result Value Ref Range Status   Specimen Description URINE, CLEAN CATCH   Final   Special Requests ADDED 0105 04/03/14   Final   Culture  Setup Time     Final   Value: 04/03/2014 01:17     Performed at [place]     Final   Value: >=100,000 COLONIES/ML     Performed at [facility]   Culture     Final   Value: ESCHERICHIA COLI     Performed at [facility]   Report Status 04/04/2014 FINAL   Final   Organism ID, Bacteria ESCHERICHIA COLI   Final    Studies/Results: Ct Head Wo Contrast  04/03/2014   CLINICAL DATA:  FALL  EXAM: CT HEAD WITHOUT CONTRAST  TECHNIQUE: Contiguous axial images were obtained from the base of the skull through the vertex without intravenous contrast.  COMPARISON:  None.  FINDINGS: No acute intracranial abnormality. Specifically, no hemorrhage, hydrocephalus, mass lesion, acute infarction, or significant intracranial injury. No acute calvarial abnormality. Mild age appropriate global atrophy. The visualized paranasal sinuses and mastoid air cells are patent.  IMPRESSION: No acute intracranial abnormality.   Electronically Signed   By: Margaree Mackintosh M.D.   On: 04/03/2014 01:19   Dg Chest Portable 1 View  04/03/2014   CLINICAL DATA:  FALL  EXAM: PORTABLE CHEST - 1 VIEW  COMPARISON:  DG CHEST 2 VIEW dated 12/29/2011  FINDINGS: Cardiac silhouette is enlarged. Patient is status post median sternotomy. There is minimal blunting of the left costophrenic angle. Lungs are clear. No acute osseous abnormalities.  IMPRESSION: Scarring versus small effusion left costophrenic angle. No acute cardiopulmonary disease.   Electronically Signed   By: Margaree Mackintosh M.D.   On: 04/03/2014 00:53    Medications: Scheduled Meds: . cephALEXin  500 mg Oral Q12H  .  cycloSPORINE  1 drop Both Eyes Q12H  . diltiazem  300 mg Oral Daily  . lactose free nutrition  1 Container Oral Q1500  . metoprolol succinate  50 mg Oral Daily  . multivitamin with minerals  1 tablet Oral Daily  . sodium chloride  3 mL Intravenous Q12H  . traZODone  300 mg Oral QHS  . vitamin C  500 mg Oral BID  . warfarin  7.5 mg Oral ONCE-1800  . Warfarin - Pharmacist Dosing Inpatient   Does not apply q1800      LOS: 3 days   Marleny Faller Krystal Eaton M.D. [facility] 04/05/2014, 12:08 PM Pager: [phone]  If 7PM-7AM, please contact night-coverage [email or web address] Password [email or web address]  **Disclaimer: This note was dictated with voice recognition software. Similar sounding words can inadvertently be transcribed and this note may contain transcription errors which may not  have been corrected upon publication of note.**

## 2014-04-05 NOTE — Discharge Summary (Signed)
Physician Discharge Summary  Patient ID: [name] [name] MRN: [id number] DOB/AGE: Dec 16, 1930 80 y.o.  Admit date: 04/02/2014 Discharge date: 04/06/2014  Primary Care Physician:  Penni Homans, MD  Discharge Diagnoses:    . Syncope . Rhabdomyolysis . UTI (urinary tract infection) . Acute renal failure . DEPRESSION . GERD . HYPERTENSION, BENIGN ESSENTIAL . HYPERCHOLESTEROLEMIA . THROMBOCYTOPENIA . BACK PAIN, CHRONIC . Atrial fibrillation . Leukocytosis  Consults:  Cardiology   Recommendations for Outpatient Follow-up:  INR check on 04/11/14  Please note Lipitor is on hold due to rhabdomyolysis  Allergies:  No Known Allergies   Discharge Medications:   Medication List    STOP taking these medications       aspirin 81 MG tablet     atorvastatin 10 MG tablet  Commonly known as:  LIPITOR      TAKE these medications       acetaminophen 325 MG tablet  Commonly known as:  TYLENOL  Take 325 mg by mouth every 6 (six) hours as needed. For pain     celecoxib 200 MG capsule  Commonly known as:  CELEBREX  Take 200 mg by mouth daily.     cephALEXin 500 MG capsule  Commonly known as:  KEFLEX  Take 1 capsule (500 mg total) by mouth 2 (two) times daily. X 5days     diltiazem 300 MG 24 hr capsule  Commonly known as:  CARDIZEM CD  Take 1 capsule (300 mg total) by mouth daily.     furosemide 40 MG tablet  Commonly known as:  LASIX  Take 1 tablet (40 mg total) by mouth daily.  Start taking on:  04/10/2014     GLUCOSAMINE CHONDR COMPLEX PO  Take by mouth 3 (three) times daily.     lactose free nutrition Liqd  Take 1 Container by mouth daily.     metoprolol succinate 50 MG 24 hr tablet  Commonly known as:  TOPROL-XL  Take 1 tablet (50 mg total) by mouth daily.     multivitamins ther. w/minerals Tabs tablet  Take 1 tablet by mouth daily.     potassium chloride SA 20 MEQ tablet  Commonly known as:  K-DUR,KLOR-CON  Take 1-2 tablets (20-40 mEq total) by mouth 2  (two) times daily. 34meq in the morning and 34meq in the evening  Start taking on:  04/10/2014     RESTASIS 0.05 % ophthalmic emulsion  Generic drug:  cycloSPORINE  Place 1 drop into both eyes every 12 (twelve) hours.     traZODone 100 MG tablet  Commonly known as:  DESYREL  Take 300 mg by mouth at bedtime.     vitamin C 500 MG tablet  Commonly known as:  ASCORBIC ACID  Take 500 mg by mouth 2 (two) times daily.     warfarin 5 MG tablet  Commonly known as:  COUMADIN  Take 1 tablet (5 mg total) by mouth daily after supper. Dose adjustment according to PT/INR         Brief H and P: For complete details please refer to admission H and P, but in brief [name] [name] is a 80 y.o. Caucasian male with history of hypertension, hypercholesterolemia, depression, heart arrhythmia (presumed atrial fibrillation)? after surgery resolved not on any anticoagulation currently, mitral valve prolapse, and heart murmur who presents with the above complaints. Patient lives by himself, on the night of 04/01/2014 patient reported that he woke up and had a loss of consciousness episode where he fell forward and  struck the table. He is uncertain what time he woke up but the next thing she remembers is waking up at 4 a.m. on 04/02/2014. As a result he presented to the emergency department for further evaluation. Patient reports that over the last week he has had increasing night sweats. Prior to the fall he reported that his heart rate had also gone up. He also reports having poor appetite. As a result he presented to the emergency department for further evaluation. Patient denied any recent fevers except night sweats. Denied any abdominal pain, diarrhea, or headaches. Denied any chest pain or shortness of breath at this time. Patient was found to be in acute failure with creatinine 2.12, urinary tract infection, and rhabdomyolysis. Hospitalist service was asked to admit the patient for further care and  management.   Hospital Course:   Syncope : Possibly due to dehydration, acute renal failure with rhabdomyolysis however rule out cardiac causes. Patient was admitted to telemetry, ruled out for acute arrhythmias, acute ACS. Troponins negative x3. Cardiology was consulted and patient to pick up the outpatient event monitor after discharge from [facility] heart care office, no cardioversion at this time, placed on Coumadin and plan outpatient cardioversion  -Echo with EF 50% and moderate LVH.    Acute renal failure with acute rhabdomyolysis  -Multifactorial, likely prerenal in the setting of UTI and NSAID use, creatinine improving.  -Resolved with hydration, and off furosemide and Celebrex . Patient will continue to hold Lasix for another 3 days, may restart on Monday 5/25   Rhabdomyolysis  Likely due to patient being on the floor for unknown period of time. CKs has been trending down with hydration.  Urinary tract infection, urine culture showed Escherichia coli,  patient was initially placed on IV Rocephin. -Rocephin discontinued, placed on Keflex for 7 days   Generalized weakness  PTOT evaluation, recommending home health PT   Leukocytosis  -Much improved with treatment of UTI   atrial fibrillation heart rate now controlled, per cardiology recommendations on oral Cardizem and also placed on warfarin. During hospitalization patient was on IV heparin drip for the bridging.  INR is 1.33, INR check on 5/26 in the [facility] Coumadin clinic  Hypertension  Better controlled on current meds.   Hypercholesterolemia  Holding statin for now due to rhabdomyolysis.   Thrombocytopenia  Chronic.Uncertain etiology. Progressively improving   Depression  Stable. Continue home medications.  GERD  Stable.   Day of Discharge BP 127/67  Pulse 98  Temp(Src) 98.9 F (37.2 C) (Oral)  Resp 18  Ht 6\' 5"  (1.956 m)  Wt 90.8 kg (200 lb 2.8 oz)  BMI 23.73 kg/m2  SpO2 96%  Physical Exam: General: Alert  and awake oriented x3 not in any acute distress. CVS: Irregularly irregular no murmur rubs or gallops Chest: clear to auscultation bilaterally, no wheezing rales or rhonchi Abdomen: soft nontender, nondistended, normal bowel sounds Extremities: no cyanosis, clubbing or edema noted bilaterally Neuro: Cranial nerves II-XII intact, no focal neurological deficits   The results of significant diagnostics from this hospitalization (including imaging, microbiology, ancillary and laboratory) are listed below for reference.    LAB RESULTS: Basic Metabolic Panel:  Recent Labs Lab 04/05/14 0645 04/06/14 0705  NA 139 141  K 3.9 4.0  CL 106 106  CO2 22 20  GLUCOSE 134* 142*  BUN 25* 19  CREATININE 0.94 0.89  CALCIUM 8.4 8.6   Liver Function Tests: No results found for this basename: AST, ALT, ALKPHOS, BILITOT, PROT, ALBUMIN,  in  the last 168 hours No results found for this basename: LIPASE, AMYLASE,  in the last 168 hours No results found for this basename: AMMONIA,  in the last 168 hours CBC:  Recent Labs Lab 04/05/14 0645 04/06/14 0705  WBC 8.6 7.9  HGB 13.5 14.0  HCT 38.9* 41.6  MCV 89.2 90.8  PLT 63* 69*   Cardiac Enzymes:  Recent Labs Lab 04/03/14 0014 04/03/14 0508 04/03/14 1158  04/05/14 0645 04/06/14 0705  CKTOTAL 97989* 9538*  --   < > 1069* 866*  CKMB 216.2*  --   --   --   --   --   TROPONINI  --  <0.30 <0.30  --   --   --   < > = values in this interval not displayed. BNP: No components found with this basename: POCBNP,  CBG: No results found for this basename: GLUCAP,  in the last 168 hours  Significant Diagnostic Studies:  Ct Head Wo Contrast  04/03/2014   CLINICAL DATA:  FALL  EXAM: CT HEAD WITHOUT CONTRAST  TECHNIQUE: Contiguous axial images were obtained from the base of the skull through the vertex without intravenous contrast.  COMPARISON:  None.  FINDINGS: No acute intracranial abnormality. Specifically, no hemorrhage, hydrocephalus, mass lesion,  acute infarction, or significant intracranial injury. No acute calvarial abnormality. Mild age appropriate global atrophy. The visualized paranasal sinuses and mastoid air cells are patent.  IMPRESSION: No acute intracranial abnormality.   Electronically Signed   By: Margaree Mackintosh M.D.   On: 04/03/2014 01:19   Dg Chest Portable 1 View  04/03/2014   CLINICAL DATA:  FALL  EXAM: PORTABLE CHEST - 1 VIEW  COMPARISON:  DG CHEST 2 VIEW dated 12/29/2011  FINDINGS: Cardiac silhouette is enlarged. Patient is status post median sternotomy. There is minimal blunting of the left costophrenic angle. Lungs are clear. No acute osseous abnormalities.  IMPRESSION: Scarring versus small effusion left costophrenic angle. No acute cardiopulmonary disease.   Electronically Signed   By: Margaree Mackintosh M.D.   On: 04/03/2014 00:53    2D ECHO:  Study Conclusions  - Left ventricle: Paradoxical septal motion. The cavity size was normal. Wall thickness was increased in a pattern of moderate LVH. The estimated ejection fraction was 50%. - Mitral valve: Mitral valve repair is intact. There is mild stenosis as noted before. - Right ventricle: The cavity size was mildly dilated. Systolic function was mildly reduced. - Right atrium: The atrium was mildly dilated. - Impressions: No obvious change from the prior study.  Impressions:  - No obvious change from the prior study.    Disposition and Follow-up: Discharge Instructions   Diet - low sodium heart healthy    Complete by:  As directed      Discharge instructions    Complete by:  As directed   Please note that you have been started on Coumadin, which is a blood thinner. Please check PT/INR levels on Monday at Dr Sherryl Barters office for the coumadin dosing which may need further adjustment.   Please HOLD lasix and potassium for another 3 days. You can restart on Monday  5/25.     Increase activity slowly    Complete by:  As directed             DISPOSITION:  Home DIET: Heart healthy diet  DISCHARGE FOLLOW-UP Follow-up Information   Follow up with [place]. (Pick up heart monitor (04/06/14) at 11:30am)    Specialty:  Cardiology  Contact information:   [address], [place] [address] [phone]      Follow up with Richardson Dopp, PA-C. ([facility] - 04/26/14 at 9:50am)    Specialty:  Physician Assistant   Contact information:   [address] [place] [address] [phone]       Follow up with [facility] On 04/11/2014. (for PT/INR check at 10:00AM)    Contact information:   [address] [place] [address]       Follow up with Penni Homans, MD. Schedule an appointment as soon as possible for a visit in 2 weeks. (for hospital follow-up)    Specialty:  Family Medicine   Contact information:   [address] [place] [address] [phone]       Time spent on Discharge: 40 minutes  Signed:   Christana Angelica Krystal Eaton M.D. [facility] 04/06/2014, 9:39 AM Pager: [phone]   **Disclaimer: This note was dictated with voice recognition software. Similar sounding words can inadvertently be transcribed and this note may contain transcription errors which may not have been corrected upon publication of note.**

## 2014-04-05 NOTE — Progress Notes (Signed)
Occupational Therapy Treatment Patient Details Name: [name] [name] MRN: [id number] DOB: 03-23-1931 Today's Date: 04/05/2014    History of present illness Pt admitted after syncopal episode with UTI, chronic thrombocytopenia   OT comments  Pt making excellent progress. Will be able to D/C home when medically stable.   Follow Up Recommendations  No OT follow up;Supervision - Intermittent    Equipment Recommendations  None recommended by OT    Recommendations for Other Services      Precautions / Restrictions Precautions Precautions: Fall Restrictions Weight Bearing Restrictions: No       Mobility Bed Mobility Overal bed mobility: Modified Independent                Transfers Overall transfer level: Modified independent Equipment used: Rolling walker (2 wheeled)             General transfer comment: good safety awareness during mobility    Balance Overall balance assessment: Needs assistance         Standing balance support: Bilateral upper extremity supported;During functional activity Standing balance-Leahy Scale: Fair Standing balance comment: Cannot accept challenges to balance but can stand without holding RW for seconds.               High level balance activites: Direction changes;Turns High Level Balance Comments: Pt supervision with RW.     ADL Overall ADL's : Needs assistance/impaired                                     Functional mobility during ADLs: Rolling walker;Modified independent General ADL Comments: completing ADL with general S. Educated pt on home safety and home modifications to reduce risk of falls. discussed E consevation. REc for pt to have assistance for ADL and IADL when he initially D/C home. Pt has all nec DME.       Vision                     Perception     Praxis      Cognition   Behavior During Therapy: [facility] for tasks assessed/performed Overall Cognitive Status: Within Functional  Limits for tasks assessed                       Extremity/Trunk Assessment               Exercises General Exercises - Lower Extremity Hip ABduction/ADduction: AROM;Both;5 reps;Standing Hip Flexion/Marching: AROM;Both;10 reps;Standing Mini-Sqauts: AROM;Both;5 reps;Standing Other Exercises Other Exercises: Educated on use of theraband - level 2 for general strengthening   Shoulder Instructions       General Comments      Pertinent Vitals/ Pain       no apparent distress   Home Living                                          Prior Functioning/Environment              Frequency Min 2X/week     Progress Toward Goals  OT Goals(current goals can now be found in the care plan section)  Progress towards OT goals: Progressing toward goals  Acute Rehab OT Goals Patient Stated Goal: return home and watch movies OT Goal Formulation: With patient Time For Goal Achievement: 04/17/14 Potential to  Achieve Goals: Good ADL Goals Pt Will Perform Lower Body Bathing: with modified independence;sit to/from stand Pt Will Perform Lower Body Dressing: with modified independence;sit to/from stand Pt Will Transfer to Toilet: with modified independence;grab bars;ambulating Additional ADL Goal #1: Pt will verbalize understanding of 3 E conservation techniques for ADL  Plan Discharge plan remains appropriate    Co-evaluation                 End of Session Equipment Utilized During Treatment: Gait belt;Rolling walker   Activity Tolerance Patient tolerated treatment well   Patient Left in chair;with call bell/phone within reach   Nurse Communication Mobility status        Time: 1145-1210 OT Time Calculation (min): 25 min  Charges: OT General Charges $OT Visit: 1 Procedure OT Treatments $Self Care/Home Management : 8-22 mins $Therapeutic Activity: 8-22 mins  Roney Jaffe Phillis Thackeray 04/05/2014, 3:41 PM   [facility], OTR/L   [phone] 04/05/2014

## 2014-04-06 ENCOUNTER — Encounter (INDEPENDENT_AMBULATORY_CARE_PROVIDER_SITE_OTHER): Payer: Medicare Other

## 2014-04-06 ENCOUNTER — Inpatient Hospital Stay (HOSPITAL_COMMUNITY): Payer: Medicare Other

## 2014-04-06 ENCOUNTER — Encounter: Payer: Self-pay | Admitting: *Deleted

## 2014-04-06 DIAGNOSIS — R55 Syncope and collapse: Secondary | ICD-10-CM

## 2014-04-06 DIAGNOSIS — Z8679 Personal history of other diseases of the circulatory system: Secondary | ICD-10-CM

## 2014-04-06 LAB — BASIC METABOLIC PANEL
BUN: 19 mg/dL (ref 6–23)
CALCIUM: 8.6 mg/dL (ref 8.4–10.5)
CO2: 20 mEq/L (ref 19–32)
Chloride: 106 mEq/L (ref 96–112)
Creatinine, Ser: 0.89 mg/dL (ref 0.50–1.35)
GFR calc Af Amer: >90 mL/min (ref >90)
GFR calc non Af Amer: 78 mL/min — ABNORMAL LOW (ref >90)
Glucose, Bld: 142 mg/dL — ABNORMAL HIGH (ref 70–99)
Potassium: 4 mEq/L (ref 3.7–5.3)
Sodium: 141 mEq/L (ref 137–147)

## 2014-04-06 LAB — CBC
HCT: 41.6 % (ref 39.0–52.0)
Hemoglobin: 14 g/dL (ref 13.0–17.0)
MCH: 30.6 pg (ref 26.0–34.0)
MCHC: 33.7 g/dL (ref 30.0–36.0)
MCV: 90.8 fL (ref 78.0–100.0)
PLATELETS: 69 10*3/uL — AB (ref 150–400)
RBC: 4.58 MIL/uL (ref 4.22–5.81)
RDW: 14.1 % (ref 11.5–15.5)
WBC: 7.9 10*3/uL (ref 4.0–10.5)

## 2014-04-06 LAB — CK: CK TOTAL: 866 U/L — AB (ref 7–232)

## 2014-04-06 LAB — PROTIME-INR
INR: 1.33 (ref 0.00–1.49)
PROTHROMBIN TIME: 16.2 s — AB (ref 11.6–15.2)

## 2014-04-06 LAB — HEPARIN LEVEL (UNFRACTIONATED): Heparin Unfractionated: 0.49 IU/mL (ref 0.30–0.70)

## 2014-04-06 MED ORDER — POTASSIUM CHLORIDE CRYS ER 20 MEQ PO TBCR: 20.0000 meq | EXTENDED_RELEASE_TABLET | Freq: Two times a day (BID) | ORAL | Status: DC

## 2014-04-06 MED ORDER — CEPHALEXIN 500 MG PO CAPS: 500.0000 mg | ORAL_CAPSULE | Freq: Two times a day (BID) | ORAL | Status: DC

## 2014-04-06 MED ORDER — WARFARIN SODIUM 5 MG PO TABS: 5.0000 mg | ORAL_TABLET | Freq: Every day | ORAL | Status: DC

## 2014-04-06 MED ORDER — DILTIAZEM HCL ER COATED BEADS 300 MG PO CP24: 300.0000 mg | ORAL_CAPSULE | Freq: Every day | ORAL | Status: DC

## 2014-04-06 MED ORDER — WARFARIN SODIUM 5 MG PO TABS
5.0000 mg | ORAL_TABLET | Freq: Every day | ORAL | Status: DC
  Filled 2014-04-06: qty 1

## 2014-04-06 MED ORDER — FUROSEMIDE 40 MG PO TABS: 40.0000 mg | ORAL_TABLET | Freq: Every day | ORAL | Status: DC

## 2014-04-06 NOTE — Progress Notes (Signed)
1032 04-06-14 Initial Medicare IM signed by patient and signed copy left on chart. Ocie Cornfield Murdock, RN,BSN [phone]

## 2014-04-06 NOTE — Progress Notes (Addendum)
ANTICOAGULATION CONSULT NOTE - Follow-up Consult  Pharmacy Consult for Heparin and Coumadin Indication: atrial fibrillation  No Known Allergies  Patient Measurements: Height: 6\' 5"  (195.6 cm) Weight: 200 lb 2.8 oz (90.8 kg) IBW/kg (Calculated) : 89.1 Heparin Dosing Weight:88 kg    Vital Signs: Temp: 98.9 F (37.2 C) (05/21 0533) Temp src: Oral (05/21 0533) BP: 127/67 mmHg (05/21 0533) Pulse Rate: 98 (05/21 0533)  Labs:  Recent Labs  04/03/14 1158  04/04/14 0910 04/04/14 1340 04/04/14 1940 04/05/14 0645 04/05/14 1530 04/06/14 0705  HGB  --   < > 14.1  --  13.8 13.5  --  14.0  HCT  --   < > 40.3  --  39.9 38.9*  --  41.6  PLT  --   < > 58*  --  64* 63*  --  69*  LABPROT  --   --   --  14.6  --  14.7  --  16.2*  INR  --   --   --  1.16  --  1.17  --  1.33  HEPARINUNFRC  --   < > <0.10*  --  0.21* 0.10* 0.41 0.49  CREATININE  --   --  1.17  --   --  0.94  --  0.89  CKTOTAL  --   --   --   --  1997* 1069*  --  866*  TROPONINI <0.30  --   --   --   --   --   --   --   < > = values in this interval not displayed.  Estimated Creatinine Clearance: 82 ml/min (by C-G formula based on Cr of 0.89).  Assessment:  80 y.o. male on heparin and Coumadin for afib. Heparin level is therapeutic. INR up to 1.33 today.   Patient with chronic thrombocytopenia. Platelets are 63 (increased today).  10/07/2011: Patient did have +HIT test but negative SRA, therefore HIT negative.   Goal of Therapy:  INR 2-3 Heparin level 0.3-0.7 units/ml Monitor platelets by anticoagulation protocol: Yes   Plan:   Recommend Coumadin 5mg  po daily with INR in 2-3 days.  Conitnue Heparin infusion to 2200 units/hr. Check heparin level in 8 hours PT/INR daily  Sloan Leiter, PharmD, BCPS Clinical Pharmacist [phone] 04/06/2014,9:12 AM

## 2014-04-06 NOTE — Discharge Instructions (Signed)
Information on my medicine - Coumadin®   (Warfarin) ° °This medication education was reviewed with me or my healthcare representative as part of my discharge preparation.  The pharmacist that spoke with me during my hospital stay was:  Shruthi Northrup Brown Latise Dilley, RPH ° °Why was Coumadin prescribed for you? °Coumadin was prescribed for you because you have a blood clot or a medical condition that can cause an increased risk of forming blood clots. Blood clots can cause serious health problems by blocking the flow of blood to the heart, lung, or brain. Coumadin can prevent harmful blood clots from forming. °As a reminder your indication for Coumadin is:   Stroke Prevention Because Of Atrial Fibrillation  ° °What test will check on my response to Coumadin? °While on Coumadin (warfarin) you will need to have an INR test regularly to ensure that your dose is keeping you in the desired range. The INR (international normalized ratio) number is calculated from the result of the laboratory test called prothrombin time (PT). ° °If an INR APPOINTMENT HAS NOT ALREADY BEEN MADE FOR YOU please schedule an appointment to have this lab work done by your health care provider within 7 days. °Your INR goal is usually a number between:  2 to 3 or your provider may give you a more narrow range like 2-2.5.  Ask your health care provider during an office visit what your goal INR is. ° °What  do you need to  know  About  COUMADIN? °Take Coumadin (warfarin) exactly as prescribed by your healthcare provider about the same time each day.  DO NOT stop taking without talking to the doctor who prescribed the medication.  Stopping without other blood clot prevention medication to take the place of Coumadin may increase your risk of developing a new clot or stroke.  Get refills before you run out. ° °What do you do if you miss a dose? °If you miss a dose, take it as soon as you remember on the same day then continue your regularly scheduled regimen the  next day.  Do not take two doses of Coumadin at the same time. ° °Important Safety Information °A possible side effect of Coumadin (Warfarin) is an increased risk of bleeding. You should call your healthcare provider right away if you experience any of the following: °  Bleeding from an injury or your nose that does not stop. °  Unusual colored urine (red or dark brown) or unusual colored stools (red or black). °  Unusual bruising for unknown reasons. °  A serious fall or if you hit your head (even if there is no bleeding). ° °Some foods or medicines interact with Coumadin® (warfarin) and might alter your response to warfarin. To help avoid this: °  Eat a balanced diet, maintaining a consistent amount of Vitamin K. °  Notify your provider about major diet changes you plan to make. °  Avoid alcohol or limit your intake to 1 drink for women and 2 drinks for men per day. °(1 drink is 5 oz. wine, 12 oz. beer, or 1.5 oz. liquor.) ° °Make sure that ANY health care provider who prescribes medication for you knows that you are taking Coumadin (warfarin).  Also make sure the healthcare provider who is monitoring your Coumadin knows when you have started a new medication including herbals and non-prescription products. ° °Coumadin® (Warfarin)  Major Drug Interactions  °Increased Warfarin Effect Decreased Warfarin Effect  °Alcohol (large quantities) °Antibiotics (esp. Septra/Bactrim, Flagyl, Cipro) °Amiodarone (Cordarone) °  ASA) °Cimetidine (Tagamet) °Megestrol (Megace) °NSAIDs (ibuprofen, naproxen, etc.) °Piroxicam (Feldene) °Propafenone (Rythmol SR) °Propranolol (Inderal) °Isoniazid (INH) °Posaconazole (Noxafil) Barbiturates (Phenobarbital) °Carbamazepine (Tegretol) °Chlordiazepoxide (Librium) °Cholestyramine (Questran) °Griseofulvin °Oral Contraceptives °Rifampin °Sucralfate (Carafate) °Vitamin K  ° °Coumadin® (Warfarin) Major Herbal Interactions  °Increased Warfarin Effect Decreased Warfarin Effect   °Garlic °Ginseng °Ginkgo biloba Coenzyme Q10 °Green tea °St. John’s wort   ° °Coumadin® (Warfarin) FOOD Interactions  °Eat a consistent number of servings per week of foods HIGH in Vitamin K °(1 serving = ½ cup)  °Collards (cooked, or boiled & drained) °Kale (cooked, or boiled & drained) °Mustard greens (cooked, or boiled & drained) °Parsley *serving size only = ¼ cup °Spinach (cooked, or boiled & drained) °Swiss chard (cooked, or boiled & drained) °Turnip greens (cooked, or boiled & drained)  °Eat a consistent number of servings per week of foods MEDIUM-HIGH in Vitamin K °(1 serving = 1 cup)  °Asparagus (cooked, or boiled & drained) °Broccoli (cooked, boiled & drained, or raw & chopped) °Brussel sprouts (cooked, or boiled & drained) *serving size only = ½ cup °Lettuce, raw (green leaf, endive, romaine) °Spinach, raw °Turnip greens, raw & chopped  ° °These websites have more information on Coumadin (warfarin):  www.coumadin.com; °www.ahrq.gov/consumer/coumadin.htm; ° ° ° °

## 2014-04-06 NOTE — Progress Notes (Addendum)
Patient: [name] [name] / Admit Date: 04/02/2014 / Date of Encounter: 04/06/2014, 12:51 PM  Subjective  Right hip pain post fall.   Currently denies palpitations. Denies CP, SOB, dizziness.  Objective   Telemetry:  Afib, HR 70s occ PVCs/couplets/triplets  EKG:  04/05/14 Aflutter vs coarse Afib  Physical Exam: Blood pressure 127/67, pulse 98, temperature 98.9 F (37.2 C), temperature source Oral, resp. rate 18, height 6\' 5"  (1.956 m), weight 200 lb 2.8 oz (90.8 kg), SpO2 96.00%. General: Well developed, well nourished, in no acute distress. Head: Normocephalic, atraumatic, sclera non-icteric, no xanthomas, nares are without discharge. Neck: Negative for carotid bruits. JVP not elevated. Lungs: Clear bilaterally to auscultation without wheezes, rales, or rhonchi. Breathing is unlabored. Heart: Irregularly irregular rhythm. S1 S2 without murmurs, rubs, or gallops.  Abdomen: Soft, non-tender, non-distended with normoactive bowel sounds. No rebound/guarding. Extremities: No clubbing or cyanosis. No edema. Distal pedal pulses are 2+ and equal bilaterally. Neuro: Alert and oriented X 3. Moves all extremities spontaneously. Psych:  Responds to questions appropriately with a normal affect.   Intake/Output Summary (Last 24 hours) at 04/06/14 1251 Last data filed at 04/06/14 0844  Gross per 24 hour  Intake    740 ml  Output   2200 ml  Net  -1460 ml    Inpatient Medications:  . cephALEXin  500 mg Oral Q12H  . cycloSPORINE  1 drop Both Eyes Q12H  . diltiazem  300 mg Oral Daily  . lactose free nutrition  1 Container Oral Q1500  . metoprolol succinate  50 mg Oral Daily  . multivitamin with minerals  1 tablet Oral Daily  . sodium chloride  3 mL Intravenous Q12H  . traZODone  300 mg Oral QHS  . vitamin C  500 mg Oral BID  . warfarin  5 mg Oral QPC supper  . Warfarin - Pharmacist Dosing Inpatient   Does not apply q1800   Infusions:  . heparin Stopped (04/06/14 1000)     Labs:  Recent Labs  04/05/14 0645 04/06/14 0705  NA 139 141  K 3.9 4.0  CL 106 106  CO2 22 20  GLUCOSE 134* 142*  BUN 25* 19  CREATININE 0.94 0.89  CALCIUM 8.4 8.6    Recent Labs  04/05/14 0645 04/06/14 0705  WBC 8.6 7.9  HGB 13.5 14.0  HCT 38.9* 41.6  MCV 89.2 90.8  PLT 63* 69*    Recent Labs  04/04/14 1940 04/05/14 0645 04/06/14 0705  McKenzie* 1069* 866*    Recent Labs  04/03/14 1425  HGBA1C 6.0*     Radiology/Studies:  Ct Head Wo Contrast  04/03/2014   CLINICAL DATA:  FALL  EXAM: CT HEAD WITHOUT CONTRAST  TECHNIQUE: Contiguous axial images were obtained from the base of the skull through the vertex without intravenous contrast.  COMPARISON:  None.  FINDINGS: No acute intracranial abnormality. Specifically, no hemorrhage, hydrocephalus, mass lesion, acute infarction, or significant intracranial injury. No acute calvarial abnormality. Mild age appropriate global atrophy. The visualized paranasal sinuses and mastoid air cells are patent.  IMPRESSION: No acute intracranial abnormality.   Electronically Signed   By: Margaree Mackintosh M.D.   On: 04/03/2014 01:19   Dg Chest Portable 1 View  04/03/2014   CLINICAL DATA:  FALL  EXAM: PORTABLE CHEST - 1 VIEW  COMPARISON:  DG CHEST 2 VIEW dated 12/29/2011  FINDINGS: Cardiac silhouette is enlarged. Patient is status post median sternotomy. There is minimal blunting of the left costophrenic angle. Lungs are  clear. No acute osseous abnormalities.  IMPRESSION: Scarring versus small effusion left costophrenic angle. No acute cardiopulmonary disease.   Electronically Signed   By: Margaree Mackintosh M.D.   On: 04/03/2014 00:53     Assessment and Plan  1. Syncope, suspect due to volume depletion/orthostasis in setting of UTI - we will arrange outpatient event monitor to assess rhythm as well as for post-termination pauses.  2. Paroxysmal atrial fibrillation (post-op MVR 2012, now recurred) with RVR, TSH wnl - Rates much better  today in the 70s but still in persistent Afib. Patient is asymptomatic. Will consolidate diltiazem to 300mg  daily (slightly lower dose than 90 q6 due to HR 50s-60s on tele). Continue Toprol. Does not need heparin bridge. OK to DC on Coumadin. Aspirin not needed. Please arrange close coumadin followup at dc - we are happy to follow in our clinic if requested. Just please let us know when you want this scheduled.  3. Urinary tract infection - on abx. WBC count now WNL.   3. Rhabdomyolysis - improving, s/p IV fluid resuscitation.   4. Acute kidney injury (normal renal function at baseline) - Creatinine has returned to baseline. Cr 0.94 this morning.   5. 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 2D echo: paradoxical septal motion, mod LVH, EF 50%, MV repair in tact, mild stenosis as noted before, mildly dilated RV and mildly reduced systolic RV function, RA mildly dilated.   6. Chronic thrombocytopenia - Plt count generally lower than prior values but remains stable. Aspirin has been stopped (normal cors 2012, no chest pain, being put on anticoag). With recent sweats last week and chronic thrombocytopenia, recommend primary team arrange heme-onc eval at DC.  7. Hyperglycemia - A1c 6.0. Not currently on medication.   8. Hyperkalemia - No longer elevated. K 3.9 this morning.   Rondel Baton, PA-S  Pt seen with MD and changes made above. Pt awaiting PT/OT per IM notes. OK to DC from cardiology standpoint.  Cardiology f/u arranged: Richardson Dopp 04/26/14 at 9:50am Patient should pick up 14-day event monitor today (04/06/14) at 1 pm Let us know if/when patient needs Coumadin clinic check if he elects to follow this in our office  Patient told: No driving for a week. Syncope was likely due to urosepsis.   Hip pain: primary team to check xray.  I informed office that he will be late.  [name]

## 2014-04-06 NOTE — Progress Notes (Signed)
Patient ID: [name] [name], male   DOB: 12/08/1930, 80 y.o.   MRN: [id number] [facility] 30 day cardiac event monitor applied to patient.

## 2014-04-11 ENCOUNTER — Ambulatory Visit: Payer: Medicare Other | Admitting: Pharmacist

## 2014-04-11 ENCOUNTER — Ambulatory Visit (INDEPENDENT_AMBULATORY_CARE_PROVIDER_SITE_OTHER): Payer: Medicare Other | Admitting: Pharmacist Clinician (PhC)/ Clinical Pharmacy Specialist

## 2014-04-11 DIAGNOSIS — Z7901 Long term (current) use of anticoagulants: Secondary | ICD-10-CM

## 2014-04-11 DIAGNOSIS — I4891 Unspecified atrial fibrillation: Secondary | ICD-10-CM

## 2014-04-11 LAB — POCT INR: INR: 2.7

## 2014-04-18 ENCOUNTER — Ambulatory Visit (INDEPENDENT_AMBULATORY_CARE_PROVIDER_SITE_OTHER): Payer: Medicare Other

## 2014-04-18 DIAGNOSIS — I4891 Unspecified atrial fibrillation: Secondary | ICD-10-CM

## 2014-04-18 DIAGNOSIS — Z7901 Long term (current) use of anticoagulants: Secondary | ICD-10-CM

## 2014-04-18 LAB — POCT INR: INR: 2.8

## 2014-04-26 ENCOUNTER — Ambulatory Visit (INDEPENDENT_AMBULATORY_CARE_PROVIDER_SITE_OTHER): Payer: Medicare Other | Admitting: Physician Assistant

## 2014-04-26 ENCOUNTER — Ambulatory Visit (INDEPENDENT_AMBULATORY_CARE_PROVIDER_SITE_OTHER): Payer: Medicare Other | Admitting: *Deleted

## 2014-04-26 ENCOUNTER — Telehealth: Payer: Self-pay | Admitting: *Deleted

## 2014-04-26 ENCOUNTER — Encounter: Payer: Self-pay | Admitting: Physician Assistant

## 2014-04-26 VITALS — BP 120/80 | HR 84 | Ht 77.0 in | Wt 190.0 lb

## 2014-04-26 DIAGNOSIS — I10 Essential (primary) hypertension: Secondary | ICD-10-CM

## 2014-04-26 DIAGNOSIS — N390 Urinary tract infection, site not specified: Secondary | ICD-10-CM

## 2014-04-26 DIAGNOSIS — Z9889 Other specified postprocedural states: Secondary | ICD-10-CM

## 2014-04-26 DIAGNOSIS — I4891 Unspecified atrial fibrillation: Secondary | ICD-10-CM

## 2014-04-26 DIAGNOSIS — E7800 Pure hypercholesterolemia, unspecified: Secondary | ICD-10-CM

## 2014-04-26 DIAGNOSIS — I493 Ventricular premature depolarization: Secondary | ICD-10-CM

## 2014-04-26 DIAGNOSIS — R55 Syncope and collapse: Secondary | ICD-10-CM

## 2014-04-26 DIAGNOSIS — I38 Endocarditis, valve unspecified: Secondary | ICD-10-CM

## 2014-04-26 DIAGNOSIS — Z7901 Long term (current) use of anticoagulants: Secondary | ICD-10-CM

## 2014-04-26 DIAGNOSIS — I4949 Other premature depolarization: Secondary | ICD-10-CM

## 2014-04-26 LAB — BASIC METABOLIC PANEL
BUN: 21 mg/dL (ref 6–23)
CALCIUM: 9.5 mg/dL (ref 8.4–10.5)
CHLORIDE: 103 meq/L (ref 96–112)
CO2: 28 mEq/L (ref 19–32)
CREATININE: 1 mg/dL (ref 0.4–1.5)
GFR: 76.21 mL/min (ref >60.00)
Glucose, Bld: 80 mg/dL (ref 70–99)
Potassium: 5.2 mEq/L — ABNORMAL HIGH (ref 3.5–5.1)
Sodium: 138 mEq/L (ref 135–145)

## 2014-04-26 LAB — MAGNESIUM: MAGNESIUM: 2.1 mg/dL (ref 1.5–2.5)

## 2014-04-26 LAB — POCT INR: INR: 3.1

## 2014-04-26 MED ORDER — WARFARIN SODIUM 5 MG PO TABS: 5.0000 mg | ORAL_TABLET | ORAL | Status: DC

## 2014-04-26 MED ORDER — FUROSEMIDE 40 MG PO TABS: 40.0000 mg | ORAL_TABLET | Freq: Every day | ORAL | Status: DC

## 2014-04-26 NOTE — Progress Notes (Signed)
Cardiology Office Note   Date:  04/26/2014   ID:  [name] [name], DOB [birth year], MRN [id number]  PCP:  Penni Homans, MD  Cardiologist:  Dr. Darlin Coco      History of Present Illness: [name] [name] is a 80 y.o. male with a hx of severe MVP s/p MV repair with Dr. Roxy Manns in 09/2011 c/b post op AFib, HTN, HL, prostate CA, chronic thrombocytopenia.  He was recently admitted 5/17-5/21 with syncope, rhabdomyolysis and acute kidney injury in the setting of UTI. Hospitalization was complicated by atrial fibrillation with RVR. He was seen by cardiology. Syncope was felt to be related to volume depletion in the setting of UTI.  CHADS2-VASc=3.  He was placed on Coumadin. NOAC was not used given valvular heart disease. He was set up for outpatient monitor to rule out post-termination pauses as a cause for his syncope. Creatinine improved prior to discharge.  He returns for follow up.    Since discharge he has had recurrent episodes of diaphoresis. He saw urology (Dr. Risa Grill) yesterday. He has recurrent UTI. He has been placed back on antibiotics. He has noted occasional heart racing since discharge. He has occasional skipped heartbeats. I have reviewed his monitor. He has been in NSR since 5/22. He has had episodes of sinus tach since then as well as PVCs and frequent couplets. No NSVT noted. No significant pauses. He denies chest pain, shortness of breath, syncope. He denies orthopnea. He has mild chronic ankle edema without change.   Studies:  - LHC (08/2011):  Normal coronary arteries  - Echo (03/2014):  Moderate LVH. EF 50%.  MV repair okay, mild MS. Right ventricle: The cavity size was mildly dilated. Systolic function was mildly reduced. - Right atrium: The atrium was mildly dilated. - No obvious change from the prior study.  - Carotid US (09/2011):  No significant ICA stenosis    Recent Labs: 01/27/2014: ALT 21; HDL Cholesterol by NMR 44.50; LDL (calc) 79  04/03/2014: TSH  1.100  04/06/2014: Creatinine 0.89; Hemoglobin 14.0; Potassium 4.0   Wt Readings from Last 3 Encounters:  04/26/14 190 lb (86.183 kg)  04/06/14 200 lb 2.8 oz (90.8 kg)  01/27/14 194 lb (87.998 kg)     Past Medical History  Diagnosis Date  . Prostate cancer   . Melanoma     Left Shoulder  . Vision abnormalities     Cornea scarring  . Osteoarthritis     Knees  . Hypertension   . Pure hypercholesterolemia   . Depressive disorder, not elsewhere classified   . MVP (mitral valve prolapse)     a. With severe MR s/p Complex valvuloplasty including artificial Gore-tex neochord placement x4, chordal transposition x1, chordal release x1, # 32 mm Sorin Memo 3D Ring Annuloplasty 2012.  Marland Kitchen Personal history of colonic polyps   . Thrombocytopenia   . Mitral regurgitation   . PVC (premature ventricular contraction)   . Neuropathy   . Adenomatous polyps   . Internal nasal lesion 05/15/2013  . PAF (paroxysmal atrial fibrillation)     a. Post-op MVR 2012.  . Pulmonary HTN     a. Mild-mod by cath 2012.  Marland Kitchen Normal coronary arteries     a. Normal coronary anatomy by cath 2012.    Current Outpatient Prescriptions  Medication Sig Dispense Refill  . acetaminophen (TYLENOL) 325 MG tablet Take 325 mg by mouth every 6 (six) hours as needed. For pain      . atorvastatin (LIPITOR) 10 MG tablet  Take 10 mg by mouth daily.      Marland Kitchen diltiazem (CARDIZEM CD) 300 MG 24 hr capsule Take 1 capsule (300 mg total) by mouth daily.  30 capsule  3  . furosemide (LASIX) 40 MG tablet Take 1 tablet (40 mg total) by mouth daily.  90 tablet  1  . Glucosamine-Chondroitin (GLUCOSAMINE CHONDR COMPLEX PO) Take by mouth 3 (three) times daily.      Marland Kitchen lactose free nutrition (BOOST) LIQD Take 1 Container by mouth daily.      . metoprolol succinate (TOPROL-XL) 50 MG 24 hr tablet Take 1 tablet (50 mg total) by mouth daily.  90 tablet  3  . Multiple Vitamins-Minerals (MULTIVITAMINS THER. W/MINERALS) TABS Take 1 tablet by mouth daily.   30 each    . potassium chloride SA (K-DUR,KLOR-CON) 20 MEQ tablet Take 1-2 tablets (20-40 mEq total) by mouth 2 (two) times daily. 40meq in the morning and 76meq in the evening      . RESTASIS 0.05 % ophthalmic emulsion Place 1 drop into both eyes every 12 (twelve) hours.       Marland Kitchen sulfamethoxazole-trimethoprim (BACTRIM DS) 800-160 MG per tablet antibiotic      . traZODone (DESYREL) 100 MG tablet Take 300 mg by mouth at bedtime.       . vitamin C (ASCORBIC ACID) 500 MG tablet Take 500 mg by mouth 2 (two) times daily.      Marland Kitchen warfarin (COUMADIN) 5 MG tablet Take 1 tablet (5 mg total) by mouth daily after supper. Dose adjustment according to PT/INR  30 tablet  0  . [DISCONTINUED] pantoprazole (PROTONIX) 40 MG tablet Take 1 tablet (40 mg total) by mouth daily before breakfast.       No current facility-administered medications for this visit.    Allergies:   Review of patient's allergies indicates no known allergies.   Social History:  The patient  reports that he has never smoked. He has never used smokeless tobacco. He reports that he does not drink alcohol or use illicit drugs.   Family History:  The patient's family history includes Arthritis in his mother; Clotting disorder in his brother; Hypertension in his father and mother; Psychosis in his father; Stroke in his mother. There is no history of Colon cancer or Stomach cancer.   ROS:  Please see the history of present illness.      All other systems reviewed and negative.   PHYSICAL EXAM: VS:  Ht 6\' 5"  (1.956 m)  Wt 190 lb (86.183 kg)  BMI 22.53 kg/m2 Well nourished, well developed, in no acute distress HEENT: normal Neck: no JVD Cardiac:  normal S1, S2; RRR; no murmur Lungs:  clear to auscultation bilaterally, no wheezing, rhonchi or rales Abd: soft, nontender, no hepatomegaly Ext: no edema Skin: warm and dry Neuro:  CNs 2-12 intact, no focal abnormalities noted  EKG:  NSR, HR 84, normal axis, RSR prime in V1-V2, anteroseptal Q  waves, first degree AV block (PR 208)     ASSESSMENT AND PLAN:  1. Atrial fibrillation: As noted, I have reviewed his monitor. He has been NSR since 5/22 and remains in NSR today. He has a high thromboembolic risk factor profile. For now, I would recommend continuing Coumadin. As his atrial Fibrillation did occur in the context of an acute illness, we could consider discontinuation of his Coumadin in the future. I will leave this up to Dr. Mare Ferrari who sees him back next month.  Continue Cardizem and Toprol. 2. Valvular  heart disease s/p mitral valve repair: Stable by recent echocardiogram. Continue SBE prophylaxis. 3. PVC (premature ventricular contraction): Suspect increased PVCs related to acute infection. Obtain basic metabolic panel and magnesium.  He may take an extra Toprol-XL 25 mg daily as needed for increased palpitations. 4. Syncope: Likely related to volume depletion in the setting of UTI. Thus far, no post-termination pauses or significant bradycardia noted on event monitor. He will continue event monitor until completion. 5. HYPERTENSION, BENIGN ESSENTIAL:  Controlled. 6. HYPERCHOLESTEROLEMIA:  Continue statin. 7. UTI (urinary tract infection):  Continue followup with urology. 8. Disposition: Followup with Dr. Lurena Nida next month as planned.   Signed, Versie Starks, MHS 04/26/2014 9:55 AM    [facility] [address], [place], [place]  [address] Phone: [phone]; Fax: [phone]

## 2014-04-26 NOTE — Patient Instructions (Signed)
OK TO TAKE AS NEEDED; EXTRA METOPROLOL 25 MG FOR PALPITATIONS  LAB WORK TODAY; BMET, MAGNESIUM LEVEL  MAKE SURE TO FINISH WEARING THE MONITOR  KEEP YOUR FOLLOW UP WITH DR. Mare Ferrari 05/2014

## 2014-04-26 NOTE — Telephone Encounter (Signed)
lmptcb on both home and cell # for lab results with med changes

## 2014-04-27 MED ORDER — POTASSIUM CHLORIDE CRYS ER 20 MEQ PO TBCR: 40.0000 meq | EXTENDED_RELEASE_TABLET | Freq: Every day | ORAL | Status: DC

## 2014-04-27 NOTE — Telephone Encounter (Signed)
pt notified about lab results and to decrease K+ to 40 meq daily, bmet 6/15 same day as CVRR appt. Pt verbalized understanding to Plan of Care.

## 2014-04-28 ENCOUNTER — Ambulatory Visit: Payer: Medicare Other | Admitting: Family

## 2014-05-01 ENCOUNTER — Ambulatory Visit (INDEPENDENT_AMBULATORY_CARE_PROVIDER_SITE_OTHER): Payer: Medicare Other

## 2014-05-01 DIAGNOSIS — Z7901 Long term (current) use of anticoagulants: Secondary | ICD-10-CM

## 2014-05-01 DIAGNOSIS — I4891 Unspecified atrial fibrillation: Secondary | ICD-10-CM

## 2014-05-01 LAB — POCT INR: INR: 2.6

## 2014-05-04 ENCOUNTER — Other Ambulatory Visit (INDEPENDENT_AMBULATORY_CARE_PROVIDER_SITE_OTHER): Payer: Medicare Other

## 2014-05-04 DIAGNOSIS — I10 Essential (primary) hypertension: Secondary | ICD-10-CM

## 2014-05-04 DIAGNOSIS — I4891 Unspecified atrial fibrillation: Secondary | ICD-10-CM

## 2014-05-04 LAB — BASIC METABOLIC PANEL
BUN: 25 mg/dL — AB (ref 6–23)
CALCIUM: 9.1 mg/dL (ref 8.4–10.5)
CO2: 32 mEq/L (ref 19–32)
CREATININE: 1.1 mg/dL (ref 0.4–1.5)
Chloride: 104 mEq/L (ref 96–112)
GFR: 71.25 mL/min (ref >60.00)
Glucose, Bld: 126 mg/dL — ABNORMAL HIGH (ref 70–99)
Potassium: 4.3 mEq/L (ref 3.5–5.1)
Sodium: 140 mEq/L (ref 135–145)

## 2014-05-05 ENCOUNTER — Telehealth: Payer: Self-pay | Admitting: *Deleted

## 2014-05-05 NOTE — Telephone Encounter (Signed)
no answer at home or cell#. I will try again later

## 2014-05-05 NOTE — Telephone Encounter (Signed)
pt notified about lab results with verbal understanding  

## 2014-05-11 ENCOUNTER — Ambulatory Visit (INDEPENDENT_AMBULATORY_CARE_PROVIDER_SITE_OTHER): Payer: Medicare Other

## 2014-05-11 DIAGNOSIS — I4891 Unspecified atrial fibrillation: Secondary | ICD-10-CM

## 2014-05-11 DIAGNOSIS — Z7901 Long term (current) use of anticoagulants: Secondary | ICD-10-CM

## 2014-05-11 LAB — POCT INR: INR: 2.1

## 2014-05-16 ENCOUNTER — Telehealth: Payer: Self-pay | Admitting: *Deleted

## 2014-05-16 NOTE — Telephone Encounter (Signed)
Advised patient of results, continue same dose of medications

## 2014-05-16 NOTE — Telephone Encounter (Signed)
Left message to call back   Event monitor reviewed by  Dr. Mare Ferrari, interpretation:   Occasional runs of Atrial Fib, predominant NSR. Occasional PVC's, no VT

## 2014-05-25 ENCOUNTER — Ambulatory Visit (INDEPENDENT_AMBULATORY_CARE_PROVIDER_SITE_OTHER): Payer: Medicare Other | Admitting: *Deleted

## 2014-05-25 DIAGNOSIS — I4891 Unspecified atrial fibrillation: Secondary | ICD-10-CM

## 2014-05-25 DIAGNOSIS — Z0000 Encounter for general adult medical examination without abnormal findings: Secondary | ICD-10-CM | POA: Insufficient documentation

## 2014-05-25 DIAGNOSIS — Z5181 Encounter for therapeutic drug level monitoring: Secondary | ICD-10-CM

## 2014-05-25 DIAGNOSIS — Z7901 Long term (current) use of anticoagulants: Secondary | ICD-10-CM

## 2014-05-25 LAB — POCT INR: INR: 1.6

## 2014-06-08 ENCOUNTER — Other Ambulatory Visit (INDEPENDENT_AMBULATORY_CARE_PROVIDER_SITE_OTHER): Payer: Medicare Other

## 2014-06-08 ENCOUNTER — Encounter: Payer: Self-pay | Admitting: Cardiology

## 2014-06-08 ENCOUNTER — Ambulatory Visit (INDEPENDENT_AMBULATORY_CARE_PROVIDER_SITE_OTHER): Payer: Medicare Other | Admitting: Cardiology

## 2014-06-08 ENCOUNTER — Ambulatory Visit (INDEPENDENT_AMBULATORY_CARE_PROVIDER_SITE_OTHER): Payer: Medicare Other | Admitting: *Deleted

## 2014-06-08 VITALS — BP 110/78 | HR 72 | Ht 77.0 in | Wt 189.0 lb

## 2014-06-08 DIAGNOSIS — I10 Essential (primary) hypertension: Secondary | ICD-10-CM

## 2014-06-08 DIAGNOSIS — Z9889 Other specified postprocedural states: Secondary | ICD-10-CM

## 2014-06-08 DIAGNOSIS — Z7901 Long term (current) use of anticoagulants: Secondary | ICD-10-CM

## 2014-06-08 DIAGNOSIS — I480 Paroxysmal atrial fibrillation: Secondary | ICD-10-CM

## 2014-06-08 DIAGNOSIS — D696 Thrombocytopenia, unspecified: Secondary | ICD-10-CM

## 2014-06-08 DIAGNOSIS — Z5181 Encounter for therapeutic drug level monitoring: Secondary | ICD-10-CM

## 2014-06-08 DIAGNOSIS — I4891 Unspecified atrial fibrillation: Secondary | ICD-10-CM

## 2014-06-08 DIAGNOSIS — E7800 Pure hypercholesterolemia, unspecified: Secondary | ICD-10-CM

## 2014-06-08 LAB — CBC WITH DIFFERENTIAL/PLATELET
Basophils Absolute: 0 10*3/uL (ref 0.0–0.1)
Basophils Relative: 0.5 % (ref 0.0–3.0)
EOS PCT: 0.2 % (ref 0.0–5.0)
Eosinophils Absolute: 0 10*3/uL (ref 0.0–0.7)
HCT: 44.5 % (ref 39.0–52.0)
Hemoglobin: 15.1 g/dL (ref 13.0–17.0)
Lymphocytes Relative: 20.5 % (ref 12.0–46.0)
Lymphs Abs: 1.3 10*3/uL (ref 0.7–4.0)
MCHC: 34.1 g/dL (ref 30.0–36.0)
MCV: 89.6 fl (ref 78.0–100.0)
MONO ABS: 0.9 10*3/uL (ref 0.1–1.0)
Monocytes Relative: 14.4 % — ABNORMAL HIGH (ref 3.0–12.0)
NEUTROS ABS: 4 10*3/uL (ref 1.4–7.7)
Neutrophils Relative %: 64.4 % (ref 43.0–77.0)
Platelets: 84 10*3/uL — ABNORMAL LOW (ref 150.0–400.0)
RBC: 4.96 Mil/uL (ref 4.22–5.81)
RDW: 14 % (ref 11.5–15.5)
WBC: 6.2 10*3/uL (ref 4.0–10.5)

## 2014-06-08 LAB — BASIC METABOLIC PANEL
BUN: 17 mg/dL (ref 6–23)
CO2: 30 mEq/L (ref 19–32)
Calcium: 9.6 mg/dL (ref 8.4–10.5)
Chloride: 106 mEq/L (ref 96–112)
Creatinine, Ser: 1 mg/dL (ref 0.4–1.5)
GFR: 77.08 mL/min (ref >60.00)
Glucose, Bld: 116 mg/dL — ABNORMAL HIGH (ref 70–99)
POTASSIUM: 4.2 meq/L (ref 3.5–5.1)
Sodium: 141 mEq/L (ref 135–145)

## 2014-06-08 LAB — LIPID PANEL
CHOL/HDL RATIO: 3
Cholesterol: 141 mg/dL (ref 0–200)
HDL: 48.4 mg/dL (ref >39.00)
LDL CALC: 79 mg/dL (ref 0–99)
NONHDL: 92.6
TRIGLYCERIDES: 69 mg/dL (ref 0.0–149.0)
VLDL: 13.8 mg/dL (ref 0.0–40.0)

## 2014-06-08 LAB — HEPATIC FUNCTION PANEL
ALT: 19 U/L (ref 0–53)
AST: 24 U/L (ref 0–37)
Albumin: 4.1 g/dL (ref 3.5–5.2)
Alkaline Phosphatase: 35 U/L — ABNORMAL LOW (ref 39–117)
BILIRUBIN DIRECT: 0.2 mg/dL (ref 0.0–0.3)
TOTAL PROTEIN: 7.1 g/dL (ref 6.0–8.3)
Total Bilirubin: 1.1 mg/dL (ref 0.2–1.2)

## 2014-06-08 LAB — POCT INR: INR: 2.1

## 2014-06-08 NOTE — Assessment & Plan Note (Signed)
The patient has hypercholesterolemia and is on low-dose Lipitor 10 mg a day.  He complains of leg weakness when he tries to climb stairs.  This may be from deconditioning.  However we will have him leave off his Lipitor for the next 2 months and see if the leg weakness improves.  If there is no change she will resume Lipitor after 2 months.

## 2014-06-08 NOTE — Progress Notes (Signed)
Quick Note:  Please report to patient. The recent labs are stable. Continue same medication and careful diet. Platelets are better ______

## 2014-06-08 NOTE — Assessment & Plan Note (Signed)
Blood pressure is remaining stable on current therapy.

## 2014-06-08 NOTE — Patient Instructions (Signed)
TRY LEAVING OFF THE LIPITOR FOR 2 MONTHS TO SEE IF LEG WEAKNESS IMPROVES, CALL Novalyn Lajara WITH UPDATE [phone]  Your physician recommends that you schedule a follow-up appointment in: 4 months with fasting labs (lp/bmet/hfp/cbc) and EKG

## 2014-06-08 NOTE — Assessment & Plan Note (Signed)
Patient is now on diltiazem and Toprol and is maintaining normal sinus rhythm.  He is on Coumadin and is having no side effects.

## 2014-06-08 NOTE — Progress Notes (Signed)
[name] [name] Date of Birth:  1931/04/02 [facility] [address] [place], [place]  [address] [phone]        Fax   [phone]   History of Present Illness: This pleasant 80 year old gentleman is seen for a scheduled followup office visit. He has a past history of severe mitral valve prolapse. He underwent mitral valve repair by Dr. Roxy Manns on 10/01/11. He did not have any coronary disease. He had a prolonged hospital course but eventually recovered and is now finished the cardiac rehabilitation program.  In may 2015 the patient had an episode of syncope at home secondary to dehydration and a urinary tract infection.  He had atrial fibrillation recurrence felt to be secondary to the stress of the UTI.  He is now back in normal sinus rhythm and an warfarin.  His echocardiogram 04/03/14 showed an ejection fraction of 50% and mitral valve repair looked good.  Current Outpatient Prescriptions  Medication Sig Dispense Refill  . acetaminophen (TYLENOL) 325 MG tablet Take 325 mg by mouth every 6 (six) hours as needed. For pain      . amoxicillin (AMOXIL) 500 MG capsule       . diltiazem (CARDIZEM CD) 300 MG 24 hr capsule Take 1 capsule (300 mg total) by mouth daily.  30 capsule  3  . furosemide (LASIX) 40 MG tablet Take 1 tablet (40 mg total) by mouth daily.  90 tablet  3  . Glucosamine-Chondroitin (GLUCOSAMINE CHONDR COMPLEX PO) Take by mouth 3 (three) times daily.      Marland Kitchen lactose free nutrition (BOOST) LIQD Take 1 Container by mouth daily.      . metoprolol succinate (TOPROL-XL) 50 MG 24 hr tablet Take 1 tablet (50 mg total) by mouth daily.  90 tablet  3  . Multiple Vitamins-Minerals (MULTIVITAMINS THER. W/MINERALS) TABS Take 1 tablet by mouth daily.  30 each    . potassium chloride SA (K-DUR,KLOR-CON) 20 MEQ tablet Take 2 tablets (40 mEq total) by mouth daily.      . RESTASIS 0.05 % ophthalmic emulsion Place 1 drop into both eyes every 12 (twelve) hours.       .  traZODone (DESYREL) 100 MG tablet Take 300 mg by mouth at bedtime.       . vitamin C (ASCORBIC ACID) 500 MG tablet Take 500 mg by mouth 2 (two) times daily.      Marland Kitchen warfarin (COUMADIN) 5 MG tablet Take 1 tablet (5 mg total) by mouth as directed.  45 tablet  3  . [DISCONTINUED] pantoprazole (PROTONIX) 40 MG tablet Take 1 tablet (40 mg total) by mouth daily before breakfast.       No current facility-administered medications for this visit.    No Known Allergies  Patient Active Problem List   Diagnosis Date Noted  . HYPERCHOLESTEROLEMIA 06/09/2008    Priority: High  . OSTEOARTHRITIS, GENERALIZED, MULTIPLE JOINTS 06/09/2008    Priority: Medium  . Encounter for therapeutic drug monitoring 05/25/2014  . Syncope 04/03/2014  . Rhabdomyolysis 04/03/2014  . UTI (urinary tract infection) 04/03/2014  . Acute renal failure 04/03/2014  . Leukocytosis 04/03/2014  . Internal nasal lesion 05/15/2013  . Low back pain 04/19/2013  . Melanoma 09/30/2012  . Cough 03/26/2012  . Hx of mitral valve repair 11/19/2011  . Encounter for long-term (current) use of anticoagulants 11/03/2011  . Decubitus skin ulcer 10/28/2011  . Pleural effusion due to congestive heart failure 10/28/2011  . Atrial fibrillation 10/07/2011  .  S/P mitral valve repair 10/01/2011  . Valvular heart disease 08/21/2011  . Cerumen impaction 04/09/2011  . Hearing loss 04/09/2011  . THROMBOCYTOPENIA 09/19/2010  . ADENOCARCINOMA, PROSTATE 09/17/2010  . CALLUS, LEFT FOOT 09/17/2010  . BACK PAIN, CHRONIC 09/17/2010  . TINEA PEDIS 05/28/2009  . DERMATITIS, ATOPIC 04/10/2009  . DEPRESSION 06/09/2008  . HYPERTENSION, BENIGN ESSENTIAL 06/09/2008  . GERD 06/09/2008  . MUSCLE SPASM, BACK 06/09/2008  . PERSONAL HISTORY MALIGNANT NEOPLASM PROSTATE 06/09/2008  . PERSONAL HISTORY OF MALIGNANT MELANOMA OF SKIN 06/09/2008  . ARRHYTHMIA, HX OF 06/09/2008  . Personal history of colonic polyps 06/09/2008    History  Smoking status  . Never  Smoker   Smokeless tobacco  . Never Used    History  Alcohol Use No    Family History  Problem Relation Age of Onset  . Clotting disorder Brother     CVA's  . Arthritis Mother   . Hypertension Mother   . Stroke Mother   . Hypertension Father   . Psychosis Father     psychiatric care  . Colon cancer Neg Hx   . Stomach cancer Neg Hx     Review of Systems: Constitutional: no fever chills diaphoresis or fatigue or change in weight.  Head and neck: no hearing loss, no epistaxis, no photophobia or visual disturbance. Respiratory: No cough, shortness of breath or wheezing. Cardiovascular: No chest pain peripheral edema, palpitations. Gastrointestinal: No abdominal distention, no abdominal pain, no change in bowel habits hematochezia or melena. Genitourinary: No dysuria, no frequency, no urgency, no nocturia. Musculoskeletal:No arthralgias, no back pain, no gait disturbance or myalgias. Neurological: No dizziness, no headaches, no numbness, no seizures, no syncope, no weakness, no tremors. Hematologic: No lymphadenopathy, no easy bruising. Psychiatric: No confusion, no hallucinations, no sleep disturbance.    Physical Exam: Filed Vitals:   06/08/14 0902  BP: 110/78  Pulse: 72   the general appearance reveals a tall elderly gentleman in no distress.The head and neck exam reveals pupils equal and reactive.  Extraocular movements are full.  There is no scleral icterus.  The mouth and pharynx are normal.  The neck is supple.  The carotids reveal no bruits.  The jugular venous pressure is normal.  The  thyroid is not enlarged.  There is no lymphadenopathy.  The chest is clear to percussion and auscultation.  There are no rales or rhonchi.  Expansion of the chest is symmetrical.  The precordium is quiet.  The first heart sound is normal.  The second heart sound is physiologically split.  There is no murmur gallop rub or click.  There is no abnormal lift or heave.  The abdomen is soft and  nontender.  The bowel sounds are normal.  The liver and spleen are not enlarged.  There are no abdominal masses.  There are no abdominal bruits.  Extremities reveal good pedal pulses.  There is no phlebitis or edema.  There is no cyanosis or clubbing.  Strength is normal and symmetrical in all extremities.  There is no lateralizing weakness.  There are no sensory deficits.  The skin is warm and dry.  There is no rash.  EKG today shows normal sinus rhythm with a single PAC.  There is left atrial enlargement with a bifid P-wave in lead 2   Assessment / Plan: 1. paroxysmal atrial fibrillation currently maintaining normal sinus rhythm on beta blocker and diltiazem and warfarin 2. status post mitral valve repair for mitral valve prolapse 2012 3. history of recurrent  urinary tract infections 4. hypercholesterolemia.  Question muscle weakness from Lipitor.  We will give trial off Lipitor. 5. past history of malignant melanoma followed by Dr. Wilhemina Bonito 6. history of prostate cancer followed by Dr. Risa Grill.  Disposition continue same medication except hold Lipitor for the next 2 months and see if leg weakness improves. Blood work today is pending.  Recheck in 4 months for office visit EKG CBC lipid panel hepatic function panel and basal metabolic panel

## 2014-07-06 ENCOUNTER — Ambulatory Visit (INDEPENDENT_AMBULATORY_CARE_PROVIDER_SITE_OTHER): Payer: Medicare Other | Admitting: *Deleted

## 2014-07-06 DIAGNOSIS — I4891 Unspecified atrial fibrillation: Secondary | ICD-10-CM

## 2014-07-06 DIAGNOSIS — Z5181 Encounter for therapeutic drug level monitoring: Secondary | ICD-10-CM

## 2014-07-06 DIAGNOSIS — Z7901 Long term (current) use of anticoagulants: Secondary | ICD-10-CM

## 2014-07-06 LAB — POCT INR: INR: 1.6

## 2014-07-12 ENCOUNTER — Other Ambulatory Visit: Payer: Self-pay

## 2014-07-12 DIAGNOSIS — I493 Ventricular premature depolarization: Secondary | ICD-10-CM

## 2014-07-12 MED ORDER — METOPROLOL SUCCINATE ER 50 MG PO TB24: 50.0000 mg | ORAL_TABLET | Freq: Every day | ORAL | Status: DC

## 2014-07-20 ENCOUNTER — Ambulatory Visit (INDEPENDENT_AMBULATORY_CARE_PROVIDER_SITE_OTHER): Payer: Medicare Other | Admitting: *Deleted

## 2014-07-20 DIAGNOSIS — I4891 Unspecified atrial fibrillation: Secondary | ICD-10-CM

## 2014-07-20 DIAGNOSIS — Z7901 Long term (current) use of anticoagulants: Secondary | ICD-10-CM

## 2014-07-20 DIAGNOSIS — Z5181 Encounter for therapeutic drug level monitoring: Secondary | ICD-10-CM

## 2014-07-20 LAB — POCT INR: INR: 1.8

## 2014-07-20 NOTE — Telephone Encounter (Signed)
error 

## 2014-07-25 ENCOUNTER — Other Ambulatory Visit: Payer: Self-pay | Admitting: *Deleted

## 2014-07-25 MED ORDER — DILTIAZEM HCL ER COATED BEADS 300 MG PO CP24: 300.0000 mg | ORAL_CAPSULE | Freq: Every day | ORAL | Status: DC

## 2014-08-04 ENCOUNTER — Ambulatory Visit (INDEPENDENT_AMBULATORY_CARE_PROVIDER_SITE_OTHER): Payer: Medicare Other | Admitting: *Deleted

## 2014-08-04 DIAGNOSIS — Z5181 Encounter for therapeutic drug level monitoring: Secondary | ICD-10-CM

## 2014-08-04 DIAGNOSIS — I4891 Unspecified atrial fibrillation: Secondary | ICD-10-CM

## 2014-08-04 DIAGNOSIS — Z7901 Long term (current) use of anticoagulants: Secondary | ICD-10-CM

## 2014-08-04 LAB — POCT INR: INR: 2.2

## 2014-08-18 ENCOUNTER — Ambulatory Visit (INDEPENDENT_AMBULATORY_CARE_PROVIDER_SITE_OTHER): Payer: Medicare Other | Admitting: *Deleted

## 2014-08-18 DIAGNOSIS — Z5181 Encounter for therapeutic drug level monitoring: Secondary | ICD-10-CM

## 2014-08-18 DIAGNOSIS — Z7901 Long term (current) use of anticoagulants: Secondary | ICD-10-CM

## 2014-08-18 DIAGNOSIS — I4891 Unspecified atrial fibrillation: Secondary | ICD-10-CM

## 2014-08-18 LAB — POCT INR: INR: 2.6

## 2014-09-08 ENCOUNTER — Ambulatory Visit (INDEPENDENT_AMBULATORY_CARE_PROVIDER_SITE_OTHER): Payer: Medicare Other | Admitting: Pharmacist

## 2014-09-08 DIAGNOSIS — I4891 Unspecified atrial fibrillation: Secondary | ICD-10-CM

## 2014-09-08 DIAGNOSIS — Z7901 Long term (current) use of anticoagulants: Secondary | ICD-10-CM

## 2014-09-08 DIAGNOSIS — Z5181 Encounter for therapeutic drug level monitoring: Secondary | ICD-10-CM

## 2014-09-08 LAB — POCT INR: INR: 2.4

## 2014-09-13 ENCOUNTER — Other Ambulatory Visit: Payer: Self-pay | Admitting: Dermatology

## 2014-10-10 ENCOUNTER — Ambulatory Visit (INDEPENDENT_AMBULATORY_CARE_PROVIDER_SITE_OTHER): Payer: Medicare Other | Admitting: Cardiology

## 2014-10-10 ENCOUNTER — Encounter: Payer: Self-pay | Admitting: Cardiology

## 2014-10-10 ENCOUNTER — Ambulatory Visit: Payer: Medicare Other | Admitting: Cardiology

## 2014-10-10 ENCOUNTER — Ambulatory Visit (INDEPENDENT_AMBULATORY_CARE_PROVIDER_SITE_OTHER): Payer: Medicare Other

## 2014-10-10 VITALS — BP 114/70 | HR 74 | Ht 77.0 in | Wt 195.0 lb

## 2014-10-10 DIAGNOSIS — Z9889 Other specified postprocedural states: Secondary | ICD-10-CM

## 2014-10-10 DIAGNOSIS — I480 Paroxysmal atrial fibrillation: Secondary | ICD-10-CM

## 2014-10-10 DIAGNOSIS — I4891 Unspecified atrial fibrillation: Secondary | ICD-10-CM | POA: Diagnosis not present

## 2014-10-10 DIAGNOSIS — I38 Endocarditis, valve unspecified: Secondary | ICD-10-CM

## 2014-10-10 DIAGNOSIS — Z5181 Encounter for therapeutic drug level monitoring: Secondary | ICD-10-CM

## 2014-10-10 DIAGNOSIS — E7800 Pure hypercholesterolemia, unspecified: Secondary | ICD-10-CM

## 2014-10-10 DIAGNOSIS — I10 Essential (primary) hypertension: Secondary | ICD-10-CM

## 2014-10-10 DIAGNOSIS — Z7901 Long term (current) use of anticoagulants: Secondary | ICD-10-CM | POA: Diagnosis not present

## 2014-10-10 LAB — LIPID PANEL
CHOL/HDL RATIO: 4
Cholesterol: 189 mg/dL (ref 0–200)
HDL: 42.1 mg/dL (ref >39.00)
LDL Cholesterol: 129 mg/dL — ABNORMAL HIGH (ref 0–99)
NonHDL: 146.9
Triglycerides: 90 mg/dL (ref 0.0–149.0)
VLDL: 18 mg/dL (ref 0.0–40.0)

## 2014-10-10 LAB — CBC WITH DIFFERENTIAL/PLATELET
BASOS PCT: 0.4 % (ref 0.0–3.0)
Basophils Absolute: 0 10*3/uL (ref 0.0–0.1)
EOS ABS: 0 10*3/uL (ref 0.0–0.7)
EOS PCT: 0.2 % (ref 0.0–5.0)
HEMATOCRIT: 45.2 % (ref 39.0–52.0)
HEMOGLOBIN: 15 g/dL (ref 13.0–17.0)
LYMPHS ABS: 1.3 10*3/uL (ref 0.7–4.0)
Lymphocytes Relative: 20.4 % (ref 12.0–46.0)
MCHC: 33.2 g/dL (ref 30.0–36.0)
MCV: 92.2 fl (ref 78.0–100.0)
MONO ABS: 1.1 10*3/uL — AB (ref 0.1–1.0)
Monocytes Relative: 17.4 % — ABNORMAL HIGH (ref 3.0–12.0)
NEUTROS ABS: 4 10*3/uL (ref 1.4–7.7)
Neutrophils Relative %: 61.6 % (ref 43.0–77.0)
Platelets: 85 10*3/uL — ABNORMAL LOW (ref 150.0–400.0)
RBC: 4.91 Mil/uL (ref 4.22–5.81)
RDW: 13.6 % (ref 11.5–15.5)
WBC: 6.5 10*3/uL (ref 4.0–10.5)

## 2014-10-10 LAB — BASIC METABOLIC PANEL
BUN: 20 mg/dL (ref 6–23)
CHLORIDE: 102 meq/L (ref 96–112)
CO2: 27 mEq/L (ref 19–32)
Calcium: 9.4 mg/dL (ref 8.4–10.5)
Creatinine, Ser: 1 mg/dL (ref 0.4–1.5)
GFR: 73.57 mL/min (ref >60.00)
Glucose, Bld: 113 mg/dL — ABNORMAL HIGH (ref 70–99)
POTASSIUM: 4.3 meq/L (ref 3.5–5.1)
Sodium: 139 mEq/L (ref 135–145)

## 2014-10-10 LAB — HEPATIC FUNCTION PANEL
ALBUMIN: 4.2 g/dL (ref 3.5–5.2)
ALK PHOS: 37 U/L — AB (ref 39–117)
ALT: 18 U/L (ref 0–53)
AST: 21 U/L (ref 0–37)
Bilirubin, Direct: 0.1 mg/dL (ref 0.0–0.3)
Total Bilirubin: 0.9 mg/dL (ref 0.2–1.2)
Total Protein: 6.9 g/dL (ref 6.0–8.3)

## 2014-10-10 LAB — POCT INR: INR: 2.6

## 2014-10-10 MED ORDER — POTASSIUM CHLORIDE CRYS ER 20 MEQ PO TBCR: 40.0000 meq | EXTENDED_RELEASE_TABLET | Freq: Every day | ORAL | Status: DC

## 2014-10-10 MED ORDER — WARFARIN SODIUM 5 MG PO TABS: 5.0000 mg | ORAL_TABLET | ORAL | Status: DC

## 2014-10-10 MED ORDER — DILTIAZEM HCL ER COATED BEADS 300 MG PO CP24: 300.0000 mg | ORAL_CAPSULE | Freq: Every day | ORAL | Status: DC

## 2014-10-10 NOTE — Patient Instructions (Signed)
Will obtain labs today and call you with the results (lp/bmet/hfp/cbc)  Your physician recommends that you continue on your current medications as directed. Please refer to the Current Medication list given to you today.  Your physician recommends that you schedule a follow-up appointment in: 4 months with fasting labs (lp/bmet/hfp)

## 2014-10-10 NOTE — Progress Notes (Signed)
[name] Date of Birth:  04/07/1931 [facility] [address] [place], [place]  [address] [phone]        Fax   [phone]   History of Present Illness: This pleasant 80 year old gentleman is seen for a scheduled followup office visit. He has a past history of severe mitral valve prolapse. He underwent mitral valve repair by Dr. Roxy Manns on 10/01/11. He did not have any coronary disease. He had a prolonged hospital course but eventually recovered and is now finished the cardiac rehabilitation program.  In may 2015 the patient had an episode of syncope at home secondary to dehydration and a urinary tract infection.  He had atrial fibrillation recurrence felt to be secondary to the stress of the UTI.  He is now back in normal sinus rhythm and an warfarin.  His echocardiogram 04/03/14 showed an ejection fraction of 50% and mitral valve repair looked good.  he has less physically active. He states that both of his knees are "shot" any also has painful arthritis in his low back. He now has to use a cane and he has installed an electric staircase in his home.  Current Outpatient Prescriptions  Medication Sig Dispense Refill  . acetaminophen (TYLENOL) 325 MG tablet Take 325 mg by mouth every 6 (six) hours as needed. For pain    . amoxicillin (AMOXIL) 500 MG capsule     . diltiazem (CARDIZEM CD) 300 MG 24 hr capsule Take 1 capsule (300 mg total) by mouth daily. 90 capsule 3  . FLUZONE HIGH-DOSE 0.5 ML SUSY   0  . furosemide (LASIX) 40 MG tablet Take 1 tablet (40 mg total) by mouth daily. 90 tablet 3  . Glucosamine-Chondroitin (GLUCOSAMINE CHONDR COMPLEX PO) Take by mouth 3 (three) times daily.    Marland Kitchen lactose free nutrition (BOOST) LIQD Take 1 Container by mouth daily.    . metoprolol succinate (TOPROL-XL) 50 MG 24 hr tablet Take 1 tablet (50 mg total) by mouth daily. 90 tablet 3  . Multiple Vitamins-Minerals (MULTIVITAMINS THER. W/MINERALS) TABS Take 1 tablet by mouth  daily. 30 each   . potassium chloride SA (K-DUR,KLOR-CON) 20 MEQ tablet Take 2 tablets (40 mEq total) by mouth daily. 180 tablet 3  . PREVNAR 13 SUSP injection   0  . RESTASIS 0.05 % ophthalmic emulsion Place 1 drop into both eyes every 12 (twelve) hours.     . traZODone (DESYREL) 100 MG tablet Take 300 mg by mouth at bedtime.     . vitamin C (ASCORBIC ACID) 500 MG tablet Take 500 mg by mouth 2 (two) times daily.    Marland Kitchen warfarin (COUMADIN) 5 MG tablet Take 1 tablet (5 mg total) by mouth as directed. 90 tablet 1  . [DISCONTINUED] pantoprazole (PROTONIX) 40 MG tablet Take 1 tablet (40 mg total) by mouth daily before breakfast.     No current facility-administered medications for this visit.    No Known Allergies  Patient Active Problem List   Diagnosis Date Noted  . HYPERCHOLESTEROLEMIA 06/09/2008    Priority: High  . OSTEOARTHRITIS, GENERALIZED, MULTIPLE JOINTS 06/09/2008    Priority: Medium  . Encounter for therapeutic drug monitoring 05/25/2014  . Syncope 04/03/2014  . Rhabdomyolysis 04/03/2014  . UTI (urinary tract infection) 04/03/2014  . Acute renal failure 04/03/2014  . Leukocytosis 04/03/2014  . Internal nasal lesion 05/15/2013  . Low back pain 04/19/2013  . Melanoma 09/30/2012  . Cough 03/26/2012  . Hx of mitral valve repair  11/19/2011  . Encounter for long-term (current) use of anticoagulants 11/03/2011  . Decubitus skin ulcer 10/28/2011  . Pleural effusion due to congestive heart failure 10/28/2011  . Atrial fibrillation 10/07/2011  . S/P mitral valve repair 10/01/2011  . Valvular heart disease 08/21/2011  . Cerumen impaction 04/09/2011  . Hearing loss 04/09/2011  . THROMBOCYTOPENIA 09/19/2010  . ADENOCARCINOMA, PROSTATE 09/17/2010  . CALLUS, LEFT FOOT 09/17/2010  . BACK PAIN, CHRONIC 09/17/2010  . TINEA PEDIS 05/28/2009  . DERMATITIS, ATOPIC 04/10/2009  . DEPRESSION 06/09/2008  . HYPERTENSION, BENIGN ESSENTIAL 06/09/2008  . GERD 06/09/2008  . MUSCLE SPASM, BACK  06/09/2008  . PERSONAL HISTORY MALIGNANT NEOPLASM PROSTATE 06/09/2008  . PERSONAL HISTORY OF MALIGNANT MELANOMA OF SKIN 06/09/2008  . ARRHYTHMIA, HX OF 06/09/2008  . Personal history of colonic polyps 06/09/2008    History  Smoking status  . Never Smoker   Smokeless tobacco  . Never Used    History  Alcohol Use No    Family History  Problem Relation Age of Onset  . Clotting disorder Brother     CVA's  . Arthritis Mother   . Hypertension Mother   . Stroke Mother   . Hypertension Father   . Psychosis Father     psychiatric care  . Colon cancer Neg Hx   . Stomach cancer Neg Hx     Review of Systems: Constitutional: no fever chills diaphoresis or fatigue or change in weight.  Head and neck: no hearing loss, no epistaxis, no photophobia or visual disturbance. Respiratory: No cough, shortness of breath or wheezing. Cardiovascular: No chest pain peripheral edema, palpitations. Gastrointestinal: No abdominal distention, no abdominal pain, no change in bowel habits hematochezia or melena. Genitourinary: No dysuria, no frequency, no urgency, no nocturia. Musculoskeletal:No arthralgias, no back pain, no gait disturbance or myalgias. Neurological: No dizziness, no headaches, no numbness, no seizures, no syncope, no weakness, no tremors. Hematologic: No lymphadenopathy, no easy bruising. Psychiatric: No confusion, no hallucinations, no sleep disturbance.    Physical Exam: Filed Vitals:   10/10/14 0917  BP: 114/70  Pulse: 74   the general appearance reveals a tall elderly gentleman in no distress.The head and neck exam reveals pupils equal and reactive.  Extraocular movements are full.  There is no scleral icterus.  The mouth and pharynx are normal.  The neck is supple.  The carotids reveal no bruits.  The jugular venous pressure is normal.  The  thyroid is not enlarged.  There is no lymphadenopathy.  The chest is clear to percussion and auscultation.  There are no rales or  rhonchi.  Expansion of the chest is symmetrical.  The precordium is quiet.  The first heart sound is normal.  The second heart sound is physiologically split.  There is no murmur gallop rub or click.  There is no abnormal lift or heave.  The abdomen is soft and nontender.  The bowel sounds are normal.  The liver and spleen are not enlarged.  There are no abdominal masses.  There are no abdominal bruits.  Extremities reveal good pedal pulses.  There is no phlebitis or edema.  There is no cyanosis or clubbing.  Strength is normal and symmetrical in all extremities.  There is no lateralizing weakness.  There are no sensory deficits.  The skin is warm and dry.  There is no rash.  EKG today shows normal sinus rhythm with  Frequent PACs and occasional PVCs.   Assessment / Plan: 1. paroxysmal atrial fibrillation currently maintaining normal  sinus rhythm on beta blocker and diltiazem and warfarin 2. status post mitral valve repair for mitral valve prolapse 2012 3. history of recurrent urinary tract infections 4. hypercholesterolemia.  Question muscle weakness from Lipitor.   He reports some improvement in leg strength off Lipitor 5. past history of malignant melanoma followed by Dr. Wilhemina Bonito 6. history of prostate cancer followed by Dr. Risa Grill.  Disposition continue same medication. Blood work today is pending.  Recheck in 4 months for office visit  lipid panel hepatic function panel and basal metabolic panel

## 2014-10-10 NOTE — Assessment & Plan Note (Signed)
The patient has not been aware of any recurrent atrial fibrillation. EKG today confirms normal sinus rhythm with PACs and PVCs

## 2014-10-10 NOTE — Assessment & Plan Note (Signed)
Physical exam reveals no recurrence of his mitral regurgitation. He is not having any symptoms of CHF.

## 2014-10-10 NOTE — Progress Notes (Signed)
Quick Note:  Please report to patient. The recent labs are stable. Continue same medication and careful diet. The cholesterol is higher since off lipitor. I would like him to try crestor 5 mg daily ______

## 2014-10-10 NOTE — Assessment & Plan Note (Signed)
Patient has a history of hypercholesterolemia. He is less physically active because of his orthopedic problems. His weight is up 6 pounds since last visit. Lab work today's pending.

## 2014-10-17 ENCOUNTER — Telehealth: Payer: Self-pay | Admitting: Cardiology

## 2014-10-17 MED ORDER — ROSUVASTATIN CALCIUM 5 MG PO TABS: 5.0000 mg | ORAL_TABLET | Freq: Every day | ORAL | Status: DC

## 2014-10-17 NOTE — Telephone Encounter (Signed)
Notes Recorded by Darlin Coco, MD on 10/10/2014 at 8:23 PM Please report to patient. The recent labs are stable. Continue same medication and careful diet. The cholesterol is higher since off lipitor. I would like him to try crestor 5 mg daily   Informed patient of Dr. Sherryl Barters note as written above. Ordered patient's crestor 5mg  daily through pharmacy of his choice. Patient requested a hard copy of labs to be mailed to him. Will send labs. Patient verbalized understanding and agrees with this plan.

## 2014-10-17 NOTE — Telephone Encounter (Signed)
Follow Up  Pt Called back for results/ please call

## 2014-11-21 ENCOUNTER — Ambulatory Visit (INDEPENDENT_AMBULATORY_CARE_PROVIDER_SITE_OTHER): Payer: Medicare Other | Admitting: Pharmacist

## 2014-11-21 DIAGNOSIS — I4891 Unspecified atrial fibrillation: Secondary | ICD-10-CM

## 2014-11-21 DIAGNOSIS — Z5181 Encounter for therapeutic drug level monitoring: Secondary | ICD-10-CM

## 2014-11-21 DIAGNOSIS — Z7901 Long term (current) use of anticoagulants: Secondary | ICD-10-CM

## 2014-11-21 LAB — POCT INR: INR: 2.4

## 2014-12-04 ENCOUNTER — Other Ambulatory Visit: Payer: Self-pay | Admitting: Dermatology

## 2014-12-25 ENCOUNTER — Other Ambulatory Visit: Payer: Self-pay | Admitting: Dermatology

## 2015-01-02 ENCOUNTER — Ambulatory Visit (INDEPENDENT_AMBULATORY_CARE_PROVIDER_SITE_OTHER): Payer: Medicare Other | Admitting: *Deleted

## 2015-01-02 DIAGNOSIS — Z5181 Encounter for therapeutic drug level monitoring: Secondary | ICD-10-CM

## 2015-01-02 DIAGNOSIS — I4891 Unspecified atrial fibrillation: Secondary | ICD-10-CM

## 2015-01-02 DIAGNOSIS — Z7901 Long term (current) use of anticoagulants: Secondary | ICD-10-CM

## 2015-01-02 LAB — POCT INR: INR: 3.6

## 2015-01-11 ENCOUNTER — Encounter: Payer: Self-pay | Admitting: Family Medicine

## 2015-01-11 ENCOUNTER — Ambulatory Visit (INDEPENDENT_AMBULATORY_CARE_PROVIDER_SITE_OTHER): Payer: Medicare Other | Admitting: Family Medicine

## 2015-01-11 VITALS — BP 124/79 | HR 74 | Temp 97.5°F | Wt 199.6 lb

## 2015-01-11 DIAGNOSIS — J3489 Other specified disorders of nose and nasal sinuses: Secondary | ICD-10-CM

## 2015-01-11 MED ORDER — MUPIROCIN 2 % EX OINT: 1.0000 "application " | TOPICAL_OINTMENT | Freq: Two times a day (BID) | CUTANEOUS | Status: DC

## 2015-01-11 MED ORDER — SULFAMETHOXAZOLE-TRIMETHOPRIM 400-80 MG PO TABS: 1.0000 | ORAL_TABLET | Freq: Two times a day (BID) | ORAL | Status: DC

## 2015-01-11 NOTE — Progress Notes (Signed)
Subjective:    Patient ID: [name] [name], male    DOB: 1931/01/09, 81 y.o.   MRN: [id number]  HPI  Patient here for sore in nostril--- he had this in past and was given bactrim and bactroban.  Past Medical History  Diagnosis Date  . Prostate cancer   . Melanoma     Left Shoulder  . Vision abnormalities     Cornea scarring  . Osteoarthritis     Knees  . Hypertension   . Pure hypercholesterolemia   . Depressive disorder, not elsewhere classified   . MVP (mitral valve prolapse)     a. With severe MR s/p Complex valvuloplasty including artificial Gore-tex neochord placement x4, chordal transposition x1, chordal release x1, # 32 mm Sorin Memo 3D Ring Annuloplasty 2012.  Marland Kitchen Personal history of colonic polyps   . Thrombocytopenia   . Mitral regurgitation   . PVC (premature ventricular contraction)   . Neuropathy   . Adenomatous polyps   . Internal nasal lesion 05/15/2013  . PAF (paroxysmal atrial fibrillation)     a. Post-op MVR 2012.  . Pulmonary HTN     a. Mild-mod by cath 2012.  Marland Kitchen Normal coronary arteries     a. Normal coronary anatomy by cath 2012.    Review of Systems  Constitutional: Negative for fatigue and unexpected weight change.  HENT: Negative for congestion, ear discharge and ear pain.        Sore R nostril  Respiratory: Negative for cough and shortness of breath.   Cardiovascular: Negative for chest pain and palpitations.  Psychiatric/Behavioral: Negative for dysphoric mood, decreased concentration and agitation. The patient is not nervous/anxious.        Objective:    Physical Exam  Constitutional: He appears well-developed and well-nourished. No distress.  HENT:  Nose: Mucosal edema present. Right sinus exhibits no maxillary sinus tenderness and no frontal sinus tenderness. Left sinus exhibits no maxillary sinus tenderness and no frontal sinus tenderness.    Cardiovascular: Normal rate, regular rhythm and normal heart sounds.   Pulmonary/Chest:  Effort normal and breath sounds normal. No respiratory distress.  Psychiatric: He has a normal mood and affect. His behavior is normal. Judgment and thought content normal.    BP 124/79 mmHg  Pulse 74  Temp(Src) 97.5 F (36.4 C) (Oral)  Wt 199 lb 9.6 oz (90.538 kg)  SpO2 97% Wt Readings from Last 3 Encounters:  01/11/15 199 lb 9.6 oz (90.538 kg)  10/10/14 195 lb (88.451 kg)  06/08/14 189 lb (85.73 kg)     Lab Results  Component Value Date   WBC 6.5 10/10/2014   HGB 15.0 10/10/2014   HCT 45.2 10/10/2014   PLT 85.0* 10/10/2014   GLUCOSE 113* 10/10/2014   CHOL 189 10/10/2014   TRIG 90.0 10/10/2014   HDL 42.10 10/10/2014   LDLCALC 129* 10/10/2014   ALT 18 10/10/2014   AST 21 10/10/2014   NA 139 10/10/2014   K 4.3 10/10/2014   CL 102 10/10/2014   CREATININE 1.0 10/10/2014   BUN 20 10/10/2014   CO2 27 10/10/2014   TSH 1.100 04/03/2014   PSA 0.07* 09/17/2010   INR 3.6 01/02/2015   HGBA1C 6.0* 04/03/2014    Ct Head Wo Contrast  04/03/2014   CLINICAL DATA:  FALL  EXAM: CT HEAD WITHOUT CONTRAST  TECHNIQUE: Contiguous axial images were obtained from the base of the skull through the vertex without intravenous contrast.  COMPARISON:  None.  FINDINGS: No acute intracranial abnormality.  Specifically, no hemorrhage, hydrocephalus, mass lesion, acute infarction, or significant intracranial injury. No acute calvarial abnormality. Mild age appropriate global atrophy. The visualized paranasal sinuses and mastoid air cells are patent.  IMPRESSION: No acute intracranial abnormality.   Electronically Signed   By: Margaree Mackintosh M.D.   On: 04/03/2014 01:19   Dg Chest Portable 1 View  04/03/2014   CLINICAL DATA:  FALL  EXAM: PORTABLE CHEST - 1 VIEW  COMPARISON:  DG CHEST 2 VIEW dated 12/29/2011  FINDINGS: Cardiac silhouette is enlarged. Patient is status post median sternotomy. There is minimal blunting of the left costophrenic angle. Lungs are clear. No acute osseous abnormalities.  IMPRESSION:  Scarring versus small effusion left costophrenic angle. No acute cardiopulmonary disease.   Electronically Signed   By: Margaree Mackintosh M.D.   On: 04/03/2014 00:53       Assessment & Plan:   Problem List Items Addressed This Visit    None    Visit Diagnoses    Nasal sore    -  Primary    Relevant Medications    BACTROBAN 2 % EX OINT    BACTRIM 400-80 MG PO TABS     I felt bactroban would be enough but pt insisted he needed both--- msg sent to coumadin clinic so he gets PT f/u sooner   Garnet Koyanagi, DO

## 2015-01-11 NOTE — Progress Notes (Signed)
Pre visit review using our clinic review tool, if applicable. No additional management support is needed unless otherwise documented below in the visit note. 

## 2015-01-16 ENCOUNTER — Ambulatory Visit (INDEPENDENT_AMBULATORY_CARE_PROVIDER_SITE_OTHER): Payer: Medicare Other

## 2015-01-16 DIAGNOSIS — Z5181 Encounter for therapeutic drug level monitoring: Secondary | ICD-10-CM

## 2015-01-16 DIAGNOSIS — Z7901 Long term (current) use of anticoagulants: Secondary | ICD-10-CM

## 2015-01-16 DIAGNOSIS — I4891 Unspecified atrial fibrillation: Secondary | ICD-10-CM

## 2015-01-16 LAB — POCT INR: INR: 3.1

## 2015-02-08 ENCOUNTER — Ambulatory Visit (INDEPENDENT_AMBULATORY_CARE_PROVIDER_SITE_OTHER): Payer: Medicare Other | Admitting: Cardiology

## 2015-02-08 ENCOUNTER — Encounter: Payer: Self-pay | Admitting: Cardiology

## 2015-02-08 ENCOUNTER — Ambulatory Visit (INDEPENDENT_AMBULATORY_CARE_PROVIDER_SITE_OTHER): Payer: Medicare Other | Admitting: *Deleted

## 2015-02-08 VITALS — BP 120/70 | HR 71 | Ht 77.0 in | Wt 195.1 lb

## 2015-02-08 DIAGNOSIS — Z5181 Encounter for therapeutic drug level monitoring: Secondary | ICD-10-CM

## 2015-02-08 DIAGNOSIS — Z9889 Other specified postprocedural states: Secondary | ICD-10-CM

## 2015-02-08 DIAGNOSIS — I10 Essential (primary) hypertension: Secondary | ICD-10-CM | POA: Diagnosis not present

## 2015-02-08 DIAGNOSIS — Z7901 Long term (current) use of anticoagulants: Secondary | ICD-10-CM | POA: Diagnosis not present

## 2015-02-08 DIAGNOSIS — I4891 Unspecified atrial fibrillation: Secondary | ICD-10-CM | POA: Diagnosis not present

## 2015-02-08 DIAGNOSIS — I480 Paroxysmal atrial fibrillation: Secondary | ICD-10-CM

## 2015-02-08 LAB — CBC WITH DIFFERENTIAL/PLATELET
BASOS ABS: 0 10*3/uL (ref 0.0–0.1)
Basophils Relative: 0.4 % (ref 0.0–3.0)
EOS ABS: 0 10*3/uL (ref 0.0–0.7)
Eosinophils Relative: 0.2 % (ref 0.0–5.0)
HCT: 46.3 % (ref 39.0–52.0)
Hemoglobin: 15.7 g/dL (ref 13.0–17.0)
LYMPHS ABS: 1.5 10*3/uL (ref 0.7–4.0)
LYMPHS PCT: 21.6 % (ref 12.0–46.0)
MCHC: 33.9 g/dL (ref 30.0–36.0)
MCV: 89.6 fl (ref 78.0–100.0)
MONO ABS: 1.1 10*3/uL — AB (ref 0.1–1.0)
Monocytes Relative: 16 % — ABNORMAL HIGH (ref 3.0–12.0)
NEUTROS ABS: 4.3 10*3/uL (ref 1.4–7.7)
Neutrophils Relative %: 61.8 % (ref 43.0–77.0)
Platelets: 89 10*3/uL — ABNORMAL LOW (ref 150.0–400.0)
RBC: 5.17 Mil/uL (ref 4.22–5.81)
RDW: 13.7 % (ref 11.5–15.5)
WBC: 6.9 10*3/uL (ref 4.0–10.5)

## 2015-02-08 LAB — BASIC METABOLIC PANEL
BUN: 27 mg/dL — AB (ref 6–23)
CO2: 30 mEq/L (ref 19–32)
CREATININE: 1.1 mg/dL (ref 0.40–1.50)
Calcium: 10 mg/dL (ref 8.4–10.5)
Chloride: 103 mEq/L (ref 96–112)
GFR: 68.14 mL/min (ref >60.00)
Glucose, Bld: 112 mg/dL — ABNORMAL HIGH (ref 70–99)
POTASSIUM: 4.5 meq/L (ref 3.5–5.1)
Sodium: 138 mEq/L (ref 135–145)

## 2015-02-08 LAB — HEPATIC FUNCTION PANEL
ALBUMIN: 4.3 g/dL (ref 3.5–5.2)
ALK PHOS: 39 U/L (ref 39–117)
ALT: 15 U/L (ref 0–53)
AST: 19 U/L (ref 0–37)
Bilirubin, Direct: 0.2 mg/dL (ref 0.0–0.3)
TOTAL PROTEIN: 7.5 g/dL (ref 6.0–8.3)
Total Bilirubin: 0.9 mg/dL (ref 0.2–1.2)

## 2015-02-08 LAB — LIPID PANEL
CHOL/HDL RATIO: 3
CHOLESTEROL: 140 mg/dL (ref 0–200)
HDL: 48.6 mg/dL (ref >39.00)
LDL Cholesterol: 74 mg/dL (ref 0–99)
NonHDL: 91.4
Triglycerides: 88 mg/dL (ref 0.0–149.0)
VLDL: 17.6 mg/dL (ref 0.0–40.0)

## 2015-02-08 LAB — POCT INR: INR: 2.3

## 2015-02-08 NOTE — Progress Notes (Signed)
Cardiology Office Note   Date:  02/08/2015   ID:  [name] [name], DOB 1931/04/17, MRN [id number]  PCP:  Penni Homans, MD  Cardiologist:   Darlin Coco, MD   No chief complaint on file.     History of Present Illness: [name] [name] is a 81 y.o. male who presents for a four-month follow-up office visit  This pleasant 81 year old gentleman is seen for a scheduled followup office visit. He has a past history of severe mitral valve prolapse. He underwent mitral valve repair by Dr. Roxy Manns on 10/01/11. He did not have any coronary disease. He had a prolonged hospital course but eventually recovered and is now finished the cardiac rehabilitation program. In may 2015 the patient had an episode of syncope at home secondary to dehydration and a urinary tract infection. He had atrial fibrillation recurrence felt to be secondary to the stress of the UTI. He is now back in normal sinus rhythm and an warfarin. His echocardiogram 04/03/14 showed an ejection fraction of 50% and mitral valve repair looked good. he has less physically active. He states that both of his knees are "shot" any also has painful arthritis in his low back. He now has to use a cane and he has installed an electric staircase in his home.  He is no longer able to do yard work because he cannot bend over to pick up things. His arthritis is followed by Dr. Durward Fortes.  He has an appointment for a physical therapy evaluation neck suite.  He has a lot of low back pain and arthritis as well. He has not been passing any chest pain.  He's had no more syncope.  He notes occasional irregular heartbeat.  He denies exertional dyspnea.  Past Medical History  Diagnosis Date  . Prostate cancer   . Melanoma     Left Shoulder  . Vision abnormalities     Cornea scarring  . Osteoarthritis     Knees  . Hypertension   . Pure hypercholesterolemia   . Depressive disorder, not elsewhere classified   . MVP (mitral valve prolapse)     a.  With severe MR s/p Complex valvuloplasty including artificial Gore-tex neochord placement x4, chordal transposition x1, chordal release x1, # 32 mm Sorin Memo 3D Ring Annuloplasty 2012.  Marland Kitchen Personal history of colonic polyps   . Thrombocytopenia   . Mitral regurgitation   . PVC (premature ventricular contraction)   . Neuropathy   . Adenomatous polyps   . Internal nasal lesion 05/15/2013  . PAF (paroxysmal atrial fibrillation)     a. Post-op MVR 2012.  . Pulmonary HTN     a. Mild-mod by cath 2012.  Marland Kitchen Normal coronary arteries     a. Normal coronary anatomy by cath 2012.    Past Surgical History  Procedure Laterality Date  . Nuclear stress test  09/2006    EF-64%, Normal  . US echocardiography  09/2009, 08/1011    mild LVH,mild AI,MVP with mild MR, mild-mod. TR with mild Pulm. HTN, EF-55-60%  . Inguinal hernia repair  09/2009    Left  . Knee arthroscopy       left x3  and right x2  . Colonoscopy w/ polypectomy    . Prostatectomy  1993  . Rotator cuff repair  2003    left  . Melanoma surgery      2001, 2005, 2006, 2009  . Tee without cardioversion  09/26/2011    Procedure: TRANSESOPHAGEAL ECHOCARDIOGRAM (TEE);  Surgeon:  Lelon Perla, MD;  Location: [facility] ENDOSCOPY;  Service: Cardiovascular;  Laterality: N/A;  . Mitral valve repair  10/01/2011    complex valvuloplasty with Goretex cord replacement and chordal transposition 76mm Sorin Memo 3D ring annuloplasty  . Root canal  08-19-12  . Cardiac catheterization  09/2011    Pre-op for MVR -- normal coronaries.     Current Outpatient Prescriptions  Medication Sig Dispense Refill  . acetaminophen (TYLENOL) 325 MG tablet Take 325 mg by mouth every 6 (six) hours as needed. For pain    . diltiazem (CARDIZEM CD) 300 MG 24 hr capsule Take 1 capsule (300 mg total) by mouth daily. 90 capsule 3  . furosemide (LASIX) 40 MG tablet Take 1 tablet (40 mg total) by mouth daily. 90 tablet 3  . Glucosamine-Chondroitin (GLUCOSAMINE CHONDR COMPLEX  PO) Take by mouth 3 (three) times daily.    Marland Kitchen lactose free nutrition (BOOST) LIQD Take 1 Container by mouth daily.    . metoprolol succinate (TOPROL-XL) 50 MG 24 hr tablet Take 1 tablet (50 mg total) by mouth daily. 90 tablet 3  . Multiple Vitamins-Minerals (MULTIVITAMINS THER. W/MINERALS) TABS Take 1 tablet by mouth daily. 30 each   . potassium chloride SA (K-DUR,KLOR-CON) 20 MEQ tablet Take 2 tablets (40 mEq total) by mouth daily. 180 tablet 3  . RESTASIS 0.05 % ophthalmic emulsion Place 1 drop into both eyes every 12 (twelve) hours.     . rosuvastatin (CRESTOR) 5 MG tablet Take 1 tablet (5 mg total) by mouth daily. 90 tablet 3  . traZODone (DESYREL) 100 MG tablet Take 300 mg by mouth at bedtime.     . vitamin C (ASCORBIC ACID) 500 MG tablet Take 500 mg by mouth 2 (two) times daily.    Marland Kitchen warfarin (COUMADIN) 5 MG tablet Take 1 tablet (5 mg total) by mouth as directed. 90 tablet 1  . [DISCONTINUED] pantoprazole (PROTONIX) 40 MG tablet Take 1 tablet (40 mg total) by mouth daily before breakfast.     No current facility-administered medications for this visit.    Allergies:   Review of patient's allergies indicates no known allergies.    Social History:  The patient  reports that he has never smoked. He has never used smokeless tobacco. He reports that he does not drink alcohol or use illicit drugs.   Family History:  The patient's family history includes Arthritis in his mother; Clotting disorder in his brother; Hypertension in his father and mother; Psychosis in his father; Stroke in his mother. There is no history of Colon cancer or Stomach cancer.    ROS:  Please see the history of present illness.   Otherwise, review of systems are positive for none.   All other systems are reviewed and negative.    PHYSICAL EXAM: VS:  BP 120/70 mmHg  Pulse 71  Ht 6\' 5"  (1.956 m)  Wt 195 lb 1.9 oz (88.506 kg)  BMI 23.13 kg/m2 , BMI Body mass index is 23.13 kg/(m^2). GEN: Well nourished, well  developed, in no acute distress HEENT: normal Neck: no JVD, carotid bruits, or masses Cardiac: RRR; no murmurs, rubs, or gallops,no edema  Respiratory:  clear to auscultation bilaterally, normal work of breathing GI: soft, nontender, nondistended, + BS MS: no deformity or atrophy Skin: warm and dry, no rash Neuro:  Strength and sensation are intact Psych: euthymic mood, full affect   EKG:  EKG is not ordered today.    Recent Labs: 04/03/2014: TSH 1.100 04/26/2014: Magnesium 2.1  10/10/2014: ALT 18; BUN 20; Creatinine 1.0; Hemoglobin 15.0; Platelets 85.0*; Potassium 4.3; Sodium 139    Lipid Panel    Component Value Date/Time   CHOL 189 10/10/2014 1015   TRIG 90.0 10/10/2014 1015   HDL 42.10 10/10/2014 1015   CHOLHDL 4 10/10/2014 1015   VLDL 18.0 10/10/2014 1015   LDLCALC 129* 10/10/2014 1015      Wt Readings from Last 3 Encounters:  02/08/15 195 lb 1.9 oz (88.506 kg)  01/11/15 199 lb 9.6 oz (90.538 kg)  10/10/14 195 lb (88.451 kg)       ASSESSMENT AND PLAN: 1. paroxysmal atrial fibrillation currently maintaining normal sinus rhythm on beta blocker and diltiazem and warfarin 2. status post mitral valve repair for mitral valve prolapse 2012 3. history of recurrent urinary tract infections 4. hypercholesterolemia. Tolerating low dose Crestor 5. past history of malignant melanoma followed by Dr. Wilhemina Bonito 6. history of prostate cancer followed by Dr. Risa Grill. 7.  Arthritis of low back and severe arthritis of both knees, followed by Dr. Durward Fortes  Disposition continue same medication. Blood work today is pending. Recheck in 4 months for office visit lipid panel hepatic function panel and basal metabolic panel and EKG   Current medicines are reviewed at length with the patient today.  The patient does not have concerns regarding medicines.  The following changes have been made:  no change  Labs/ tests ordered today include:   Orders Placed This Encounter    Procedures  . Lipid panel  . Hepatic function panel  . Basic metabolic panel  . CBC with Differential/Platelet     Signed, Darlin Coco, MD  02/08/2015 9:20 AM    [facility] [place], [place], [place]  [address] Phone: [phone]; Fax: [phone]

## 2015-02-08 NOTE — Progress Notes (Signed)
Quick Note:  Please report to patient. The recent labs are stable. Continue same medication and careful diet. ______ 

## 2015-02-08 NOTE — Addendum Note (Signed)
Addended by: Eulis Foster on: 02/08/2015 09:25 AM   Modules accepted: Orders

## 2015-02-08 NOTE — Patient Instructions (Signed)
Will obtain labs today and call you with the results (lp/bmet/hfp)  Your physician recommends that you continue on your current medications as directed. Please refer to the Current Medication list given to you today.  Your physician wants you to follow-up in: 4 months with fasting labs (lp/bmet/hfp/cbc) and ekg   You will receive a reminder letter in the mail two months in advance. If you don't receive a letter, please call our office to schedule the follow-up appointment.

## 2015-02-13 ENCOUNTER — Encounter: Payer: Self-pay | Admitting: Physical Therapy

## 2015-02-13 ENCOUNTER — Ambulatory Visit: Payer: Medicare Other | Attending: Orthopaedic Surgery | Admitting: Physical Therapy

## 2015-02-13 DIAGNOSIS — M545 Low back pain, unspecified: Secondary | ICD-10-CM

## 2015-02-13 DIAGNOSIS — R262 Difficulty in walking, not elsewhere classified: Secondary | ICD-10-CM | POA: Diagnosis not present

## 2015-02-13 NOTE — Therapy (Signed)
[place], [place], [address] Phone: [phone]   Fax:  [phone]  Physical Therapy Evaluation  Patient Details  Name: [name] [name] MRN: [id number] Date of Birth: [birth year] Referring Provider:  Garald Balding, MD  Encounter Date: 02/13/2015      PT End of Session - 02/13/15 1119    Visit Number 1   Date for PT Re-Evaluation 04/14/15   PT Start Time 6948   PT Stop Time 1145   PT Time Calculation (min) 56 min   Activity Tolerance Patient tolerated treatment well   Behavior During Therapy [facility] for tasks assessed/performed      Past Medical History  Diagnosis Date  . Prostate cancer   . Melanoma     Left Shoulder  . Vision abnormalities     Cornea scarring  . Osteoarthritis     Knees  . Hypertension   . Pure hypercholesterolemia   . Depressive disorder, not elsewhere classified   . MVP (mitral valve prolapse)     a. With severe MR s/p Complex valvuloplasty including artificial Gore-tex neochord placement x4, chordal transposition x1, chordal release x1, # 32 mm Sorin Memo 3D Ring Annuloplasty 2012.  Marland Kitchen Personal history of colonic polyps   . Thrombocytopenia   . Mitral regurgitation   . PVC (premature ventricular contraction)   . Neuropathy   . Adenomatous polyps   . Internal nasal lesion 05/15/2013  . PAF (paroxysmal atrial fibrillation)     a. Post-op MVR 2012.  . Pulmonary HTN     a. Mild-mod by cath 2012.  Marland Kitchen Normal coronary arteries     a. Normal coronary anatomy by cath 2012.    Past Surgical History  Procedure Laterality Date  . Nuclear stress test  09/2006    EF-64%, Normal  . US echocardiography  09/2009, 08/1011    mild LVH,mild AI,MVP with mild MR, mild-mod. TR with mild Pulm. HTN, EF-55-60%  . Inguinal hernia repair  09/2009    Left  . Knee arthroscopy       left x3  and right x2  . Colonoscopy w/ polypectomy    . Prostatectomy  1993  . Rotator cuff  repair  2003    left  . Melanoma surgery      2001, 2005, 2006, 2009  . Tee without cardioversion  09/26/2011    Procedure: TRANSESOPHAGEAL ECHOCARDIOGRAM (TEE);  Surgeon: Lelon Perla, MD;  Location: [facility] ENDOSCOPY;  Service: Cardiovascular;  Laterality: N/A;  . Mitral valve repair  10/01/2011    complex valvuloplasty with Goretex cord replacement and chordal transposition 49mm Sorin Memo 3D ring annuloplasty  . Root canal  08-19-12  . Cardiac catheterization  09/2011    Pre-op for MVR -- normal coronaries.    There were no vitals filed for this visit.  Visit Diagnosis:  Midline low back pain without sciatica - Plan: PT plan of care cert/re-cert  Difficulty walking - Plan: PT plan of care cert/re-cert      Subjective Assessment - 02/13/15 1058    Symptoms Reports that he has been having some significant LBP since last summer, reports that he has arthritis in the low back, pain may be more due to unable to take Celebrex due to being on Coumadin   Pertinent History bilateral knee pain   Limitations Standing;Walking;House hold activities;Lifting   How long can you sit comfortably? 30 minutes   How long can you stand comfortably? 10  How long can you walk comfortably? 30 minutes   Patient Stated Goals no pain, and walk better   Currently in Pain? Yes   Pain Score 9    Pain Location Back   Pain Orientation Mid;Lower   Pain Descriptors / Indicators Aching;Sharp   Pain Type Chronic pain   Pain Onset More than a month ago   Pain Frequency Constant   Aggravating Factors  bending and lifting any activity being on feet   Pain Relieving Factors rest, lying down   Effect of Pain on Daily Activities limits everything            [facility] PT Assessment - 02/13/15 0001    Assessment   Medical Diagnosis low back pain   Prior Therapy none   Precautions   Precautions None   Balance Screen   Has the patient fallen in the past 6 months No   Has the patient had a decrease in activity  level because of a fear of falling?  No   Is the patient reluctant to leave their home because of a fear of falling?  No   Home Environment   Living Enviornment Private residence   Living Arrangements Alone   Additional Comments has a stair lift due to knees   Prior Function   Level of Independence Independent with basic ADLs;Independent with homemaking with ambulation   Leisure no exercise   Posture/Postural Control   Posture Comments fwd head, rounded shoulders, stoops more after walking or standing any length of time   AROM   Overall AROM Comments Lumbar ROM was decreased 50% with pain in the low back, denied radicular signs   Flexibility   Soft Tissue Assessment /Muscle Length --  tight in the HS and piriformis mms   Palpation   Palpation some tightness int he paraspinals   Ambulation/Gait   Gait Comments gait is without asistive device, slow, stooped forward posture                   [facility] Adult PT Treatment/Exercise - 02/13/15 0001    Modalities   Modalities Electrical Stimulation;Moist Heat   Moist Heat Therapy   Number Minutes Moist Heat 15 Minutes   Moist Heat Location Other (comment)   Electrical Stimulation   Electrical Stimulation Location low back   Electrical Stimulation Parameters IFC   Electrical Stimulation Goals Pain                PT Education - 02/13/15 1118    Education provided Yes   Education Details HEP for Wms Flexion exercises for the lumbar spine   Person(s) Educated Patient   Methods Explanation;Demonstration   Comprehension Verbalized understanding          PT Short Term Goals - 02/13/15 1123    PT SHORT TERM GOAL #1   Title independent with initial HEP   Time 2   Period Weeks   Status New           PT Long Term Goals - 02/13/15 1124    PT LONG TERM GOAL #1   Title understand proper posture and body mechanics   Time 8   Period Weeks   Status New   PT LONG TERM GOAL #2   Title increase lumbar ROM 25%    Time 8   Period Weeks   Status New   PT LONG TERM GOAL #3   Title decrease pain 50%   Time 8   Period Weeks  Status New   PT LONG TERM GOAL #4   Title do housework without difficulty   Time 8   Period Weeks   Status New               Plan - 13-Mar-2015 1119    Clinical Impression Statement Patient with significant LBP with activity, tends to stoop when walking.  has some tightness in the Hs and piriformis mms   Pt will benefit from skilled therapeutic intervention in order to improve on the following deficits Decreased range of motion;Difficulty walking;Decreased strength;Increased muscle spasms;Pain   Rehab Potential Good   PT Frequency 2x / week   PT Duration 8 weeks   PT Treatment/Interventions Electrical Stimulation;Moist Heat;Traction;Functional mobility training;Therapeutic activities;Therapeutic exercise;Patient/family education;Manual techniques   PT Next Visit Plan add exercises in the gym   Consulted and Agree with Plan of Care Patient          G-Codes - 03-13-2015 1125    Functional Assessment Tool Used FOTO   Functional Limitation Mobility: Walking and moving around   Mobility: Walking and Moving Around Current Status [phone]) At least 40 percent but less than 60 percent impaired, limited or restricted   Mobility: Walking and Moving Around Goal Status ([id number]) At least 40 percent but less than 60 percent impaired, limited or restricted       Problem List Patient Active Problem List   Diagnosis Date Noted  . Encounter for therapeutic drug monitoring 05/25/2014  . Syncope 04/03/2014  . Rhabdomyolysis 04/03/2014  . UTI (urinary tract infection) 04/03/2014  . Acute renal failure 04/03/2014  . Leukocytosis 04/03/2014  . Internal nasal lesion 05/15/2013  . Low back pain 04/19/2013  . Melanoma 09/30/2012  . Cough 03/26/2012  . Hx of mitral valve repair 11/19/2011  . Encounter for long-term (current) use of anticoagulants 11/03/2011  . Decubitus skin ulcer  10/28/2011  . Pleural effusion due to congestive heart failure 10/28/2011  . Atrial fibrillation 10/07/2011  . S/P mitral valve repair 10/01/2011  . Valvular heart disease 08/21/2011  . Cerumen impaction 04/09/2011  . Hearing loss 04/09/2011  . THROMBOCYTOPENIA 09/19/2010  . ADENOCARCINOMA, PROSTATE 09/17/2010  . CALLUS, LEFT FOOT 09/17/2010  . BACK PAIN, CHRONIC 09/17/2010  . TINEA PEDIS 05/28/2009  . DERMATITIS, ATOPIC 04/10/2009  . HYPERCHOLESTEROLEMIA 06/09/2008  . DEPRESSION 06/09/2008  . HYPERTENSION, BENIGN ESSENTIAL 06/09/2008  . GERD 06/09/2008  . OSTEOARTHRITIS, GENERALIZED, MULTIPLE JOINTS 06/09/2008  . MUSCLE SPASM, BACK 06/09/2008  . PERSONAL HISTORY MALIGNANT NEOPLASM PROSTATE 06/09/2008  . PERSONAL HISTORY OF MALIGNANT MELANOMA OF SKIN 06/09/2008  . ARRHYTHMIA, HX OF 06/09/2008  . Personal history of colonic polyps 06/09/2008    Sumner Boast, PT 03-13-2015, 11:28 AM  [place] [address] [place], [place], [address] Phone: [phone]   Fax:  [phone]

## 2015-02-16 ENCOUNTER — Ambulatory Visit: Payer: Medicare Other | Attending: Orthopaedic Surgery | Admitting: Physical Therapy

## 2015-02-16 DIAGNOSIS — M545 Low back pain, unspecified: Secondary | ICD-10-CM

## 2015-02-16 DIAGNOSIS — R262 Difficulty in walking, not elsewhere classified: Secondary | ICD-10-CM | POA: Diagnosis not present

## 2015-02-16 NOTE — Therapy (Signed)
[place], [place], [address] Phone: [phone]   Fax:  [phone]  Physical Therapy Treatment  Patient Details  Name: [name] [name] MRN: [id number] Date of Birth: [birth year] Referring Provider:  Garald Balding, MD  Encounter Date: 02/16/2015      PT End of Session - 02/16/15 1022    Visit Number 2   PT Start Time 0940   PT Stop Time 1030   PT Time Calculation (min) 50 min      Past Medical History  Diagnosis Date   Prostate cancer    Melanoma     Left Shoulder   Vision abnormalities     Cornea scarring   Osteoarthritis     Knees   Hypertension    Pure hypercholesterolemia    Depressive disorder, not elsewhere classified    MVP (mitral valve prolapse)     a. With severe MR s/p Complex valvuloplasty including artificial Gore-tex neochord placement x4, chordal transposition x1, chordal release x1, # 32 mm Sorin Memo 3D Ring Annuloplasty 2012.   Personal history of colonic polyps    Thrombocytopenia    Mitral regurgitation    PVC (premature ventricular contraction)    Neuropathy    Adenomatous polyps    Internal nasal lesion 05/15/2013   PAF (paroxysmal atrial fibrillation)     a. Post-op MVR 2012.   Pulmonary HTN     a. Mild-mod by cath 2012.   Normal coronary arteries     a. Normal coronary anatomy by cath 2012.    Past Surgical History  Procedure Laterality Date   Nuclear stress test  09/2006    EF-64%, Normal   US echocardiography  09/2009, 08/1011    mild LVH,mild AI,MVP with mild MR, mild-mod. TR with mild Pulm. HTN, EF-55-60%   Inguinal hernia repair  09/2009    Left   Knee arthroscopy       left x3  and right x2   Colonoscopy w/ polypectomy     Prostatectomy  1993   Rotator cuff repair  2003    left   Melanoma surgery      2001, 2005, 2006, 2009   Tee without cardioversion  09/26/2011    Procedure: TRANSESOPHAGEAL ECHOCARDIOGRAM  (TEE);  Surgeon: Lelon Perla, MD;  Location: [facility] ENDOSCOPY;  Service: Cardiovascular;  Laterality: N/A;   Mitral valve repair  10/01/2011    complex valvuloplasty with Goretex cord replacement and chordal transposition 60m Sorin Memo 3D ring annuloplasty   Root canal  08-19-12   Cardiac catheterization  09/2011    Pre-op for MVR -- normal coronaries.    There were no vitals filed for this visit.  Visit Diagnosis:  Midline low back pain without sciatica  Difficulty walking      Subjective Assessment - 02/16/15 1017    Symptoms Increased knee pain with knees to chest HEP.  Increased LBP today; "maybe the weather is making it worse"   Pain Score 8    Pain Location Back   Pain Orientation Lower            [facility] PT Assessment - 02/16/15 0001    Assessment   Medical Diagnosis low back pain                   [facility] Adult PT Treatment/Exercise - 02/16/15 0001    Lumbar Exercises: Stretches   Active Hamstring Stretch 4 reps;10 seconds   Active Hamstring  Stretch Limitations --  knee pain.  Tight HS   Single Knee to Chest Stretch 5 reps;10 seconds   Double Knee to Chest Stretch 5 reps;10 seconds   Lower Trunk Rotation 5 reps;10 seconds   Pelvic Tilt Other (comment)  10 reps; 5 seconds   Piriformis Stretch 5 reps;20 seconds   Lumbar Exercises: Supine   Clam 10 reps;5 seconds   Moist Heat Therapy   Number Minutes Moist Heat 15 Minutes  15 minutes   Moist Heat Location Other (comment)  low back   Electrical Stimulation   Electrical Stimulation Location low back   Electrical Stimulation Parameters IFC  IFC   Electrical Stimulation Goals Pain                  PT Short Term Goals - 02/16/15 1015    PT SHORT TERM GOAL #1   Title independent with initial HEP   Baseline increased knee pain with current technique   Time 2   Period Weeks   Status Partially Met           PT Long Term Goals - 02/16/15 1015    PT LONG TERM GOAL #1   Title  understand proper posture and body mechanics   Time 8   Period Weeks   Status New   PT LONG TERM GOAL #2   Title increase lumbar ROM 25%   Time 8   Period Weeks   Status New   PT LONG TERM GOAL #3   Title decrease pain 50%   Time 8   Period Weeks   Status New   PT LONG TERM GOAL #4   Title do housework without difficulty   Time 8   Period Weeks   Status New               Plan - 02/16/15 1019    Clinical Impression Statement Tight hamstrings and piriformis bilaterally.  Stooped posture. Increased knee pain with hamstring stretches, and flexion exercises.  Better with hands at posterior thigh.   Pt will benefit from skilled therapeutic intervention in order to improve on the following deficits Abnormal gait;Decreased strength;Impaired flexibility   Rehab Potential Fair   PT Frequency 2x / week   PT Treatment/Interventions Electrical Stimulation;Moist Heat;Therapeutic activities;Therapeutic exercise;Balance training   PT Next Visit Plan increase LE flexibility, L/S flexion, and lumbopelvic stabilization        Problem List Patient Active Problem List   Diagnosis Date Noted   Encounter for therapeutic drug monitoring 05/25/2014   Syncope 04/03/2014   Rhabdomyolysis 04/03/2014   UTI (urinary tract infection) 04/03/2014   Acute renal failure 04/03/2014   Leukocytosis 04/03/2014   Internal nasal lesion 05/15/2013   Low back pain 04/19/2013   Melanoma 09/30/2012   Cough 03/26/2012   Hx of mitral valve repair 11/19/2011   Encounter for long-term (current) use of anticoagulants 11/03/2011   Decubitus skin ulcer 10/28/2011   Pleural effusion due to congestive heart failure 10/28/2011   Atrial fibrillation 10/07/2011   S/P mitral valve repair 10/01/2011   Valvular heart disease 08/21/2011   Cerumen impaction 04/09/2011   Hearing loss 04/09/2011   THROMBOCYTOPENIA 09/19/2010   ADENOCARCINOMA, PROSTATE 09/17/2010   CALLUS, LEFT FOOT 09/17/2010    BACK PAIN, CHRONIC 09/17/2010   TINEA PEDIS 05/28/2009   DERMATITIS, ATOPIC 04/10/2009   HYPERCHOLESTEROLEMIA 06/09/2008   DEPRESSION 06/09/2008   HYPERTENSION, BENIGN ESSENTIAL 06/09/2008   GERD 06/09/2008   OSTEOARTHRITIS, GENERALIZED, MULTIPLE JOINTS 06/09/2008   MUSCLE  SPASM, BACK 06/09/2008   PERSONAL HISTORY MALIGNANT NEOPLASM PROSTATE 06/09/2008   PERSONAL HISTORY OF MALIGNANT MELANOMA OF SKIN 06/09/2008   ARRHYTHMIA, HX OF 06/09/2008   Personal history of colonic polyps 06/09/2008    Olean Ree, PTA 02/16/2015, 10:25 AM  [place] [address] [place], [place], [address] Phone: [phone]   Fax:  [phone]

## 2015-02-21 ENCOUNTER — Ambulatory Visit: Payer: Medicare Other | Admitting: Physical Therapy

## 2015-02-21 ENCOUNTER — Encounter: Payer: Self-pay | Admitting: Physical Therapy

## 2015-02-21 DIAGNOSIS — M545 Low back pain, unspecified: Secondary | ICD-10-CM

## 2015-02-21 DIAGNOSIS — R262 Difficulty in walking, not elsewhere classified: Secondary | ICD-10-CM

## 2015-02-21 NOTE — Therapy (Signed)
[place], [place], [address] Phone: [phone]   Fax:  [phone]  Physical Therapy Treatment  Patient Details  Name: [name] [name] MRN: [id number] Date of Birth: May 13, 1931 Referring Provider:  Garald Balding, MD  Encounter Date: 02/21/2015      PT End of Session - 02/21/15 1131    Visit Number 3   Date for PT Re-Evaluation 04/14/15   PT Start Time 1012   PT Stop Time 1123   PT Time Calculation (min) 71 min   Activity Tolerance Patient tolerated treatment well;Patient limited by pain   Behavior During Therapy [facility] for tasks assessed/performed      Past Medical History  Diagnosis Date  . Prostate cancer   . Melanoma     Left Shoulder  . Vision abnormalities     Cornea scarring  . Osteoarthritis     Knees  . Hypertension   . Pure hypercholesterolemia   . Depressive disorder, not elsewhere classified   . MVP (mitral valve prolapse)     a. With severe MR s/p Complex valvuloplasty including artificial Gore-tex neochord placement x4, chordal transposition x1, chordal release x1, # 32 mm Sorin Memo 3D Ring Annuloplasty 2012.  Marland Kitchen Personal history of colonic polyps   . Thrombocytopenia   . Mitral regurgitation   . PVC (premature ventricular contraction)   . Neuropathy   . Adenomatous polyps   . Internal nasal lesion 05/15/2013  . PAF (paroxysmal atrial fibrillation)     a. Post-op MVR 2012.  . Pulmonary HTN     a. Mild-mod by cath 2012.  Marland Kitchen Normal coronary arteries     a. Normal coronary anatomy by cath 2012.    Past Surgical History  Procedure Laterality Date  . Nuclear stress test  09/2006    EF-64%, Normal  . US echocardiography  09/2009, 08/1011    mild LVH,mild AI,MVP with mild MR, mild-mod. TR with mild Pulm. HTN, EF-55-60%  . Inguinal hernia repair  09/2009    Left  . Knee arthroscopy       left x3  and right x2  . Colonoscopy w/ polypectomy    . Prostatectomy  1993   . Rotator cuff repair  2003    left  . Melanoma surgery      2001, 2005, 2006, 2009  . Tee without cardioversion  09/26/2011    Procedure: TRANSESOPHAGEAL ECHOCARDIOGRAM (TEE);  Surgeon: Lelon Perla, MD;  Location: [facility] ENDOSCOPY;  Service: Cardiovascular;  Laterality: N/A;  . Mitral valve repair  10/01/2011    complex valvuloplasty with Goretex cord replacement and chordal transposition 75mm Sorin Memo 3D ring annuloplasty  . Root canal  08-19-12  . Cardiac catheterization  09/2011    Pre-op for MVR -- normal coronaries.    There were no vitals filed for this visit.  Visit Diagnosis:  Midline low back pain without sciatica  Difficulty walking      Subjective Assessment - 02/21/15 1014    Subjective Not doing too good.   Currently in Pain? Yes   Pain Score 8             [facility] PT Assessment - 02/21/15 0001    ROM / Strength   AROM / PROM / Strength AROM   AROM   AROM Assessment Site Lumbar   Lumbar Flexion decreased 50%   Lumbar Extension decreased 25%   Lumbar - Right Side Bend WFL's   Lumbar -  Left Side Bend WFL's   Lumbar - Right Rotation WFL's   Lumbar - Left Rotation WFL's                   [facility] Adult PT Treatment/Exercise - 02/21/15 0001    Transfers   Transfers --  supine to sit(log roll)   Exercises   Exercises Lumbar   Lumbar Exercises: Aerobic   Elliptical 6 minutes  Nustep level 5   Lumbar Exercises: Standing   Other Standing Lumbar Exercises 5# pull to midline  10 reps bilaterally   Lumbar Exercises: Seated   Other Seated Lumbar Exercises pelvic tilts   Modalities   Modalities Electrical Stimulation;Moist Heat;Traction   Moist Heat Therapy   Number Minutes Moist Heat 15 Minutes   Moist Heat Location Other (comment)  lumbar   Electrical Stimulation   Electrical Stimulation Location low back   Electrical Stimulation Parameters IFC   Electrical Stimulation Goals Pain   Traction   Type of Traction Lumbar   Min (lbs) 60    Max (lbs) 60   Hold Time static   Rest Time static   Time 15 minutes                PT Education - 02/21/15 1109    Education provided Yes   Education Details Educated on posture, lifting, body mechanics.  Towel roll and postural reset at wall.   Person(s) Educated Patient   Methods Demonstration;Explanation   Comprehension Verbalized understanding;Returned demonstration          PT Short Term Goals - 02/21/15 1112    PT SHORT TERM GOAL #1   Title independent with initial HEP   Baseline increased knee pain with current technique   Time 2   Period Weeks   Status Achieved           PT Long Term Goals - 02/21/15 1112    PT LONG TERM GOAL #1   Title understand proper posture and body mechanics   Time 8   Period Weeks   Status Achieved   PT LONG TERM GOAL #2   Title increase lumbar ROM 25%   Time 8   Period Weeks   Status On-going   PT LONG TERM GOAL #3   Title decrease pain 50%   Time 8   Period Weeks   Status On-going   PT LONG TERM GOAL #4   Title do housework without difficulty   Time 8   Period Weeks   Status On-going               Plan - 02/21/15 1113    Clinical Impression Statement Very guarded.  Pain in bilateral knees with proper lifting techniques.  Explained need for proper movement that knees will hurt anyway try to keep LBP to a minimum.   Pt will benefit from skilled therapeutic intervention in order to improve on the following deficits Abnormal gait;Decreased strength;Impaired flexibility   Rehab Potential Fair   PT Frequency 2x / week   PT Duration 8 weeks   PT Treatment/Interventions Electrical Stimulation;Moist Heat;Therapeutic activities;Therapeutic exercise;Balance training;Traction   PT Next Visit Plan increase LE flexibility, L/S flexion, and lumbopelvic stabilization   Consulted and Agree with Plan of Care Patient        Problem List Patient Active Problem List   Diagnosis Date Noted  . Encounter for therapeutic  drug monitoring 05/25/2014  . Syncope 04/03/2014  . Rhabdomyolysis 04/03/2014  . UTI (urinary tract infection) 04/03/2014  .  Acute renal failure 04/03/2014  . Leukocytosis 04/03/2014  . Internal nasal lesion 05/15/2013  . Low back pain 04/19/2013  . Melanoma 09/30/2012  . Cough 03/26/2012  . Hx of mitral valve repair 11/19/2011  . Encounter for long-term (current) use of anticoagulants 11/03/2011  . Decubitus skin ulcer 10/28/2011  . Pleural effusion due to congestive heart failure 10/28/2011  . Atrial fibrillation 10/07/2011  . S/P mitral valve repair 10/01/2011  . Valvular heart disease 08/21/2011  . Cerumen impaction 04/09/2011  . Hearing loss 04/09/2011  . THROMBOCYTOPENIA 09/19/2010  . ADENOCARCINOMA, PROSTATE 09/17/2010  . CALLUS, LEFT FOOT 09/17/2010  . BACK PAIN, CHRONIC 09/17/2010  . TINEA PEDIS 05/28/2009  . DERMATITIS, ATOPIC 04/10/2009  . HYPERCHOLESTEROLEMIA 06/09/2008  . DEPRESSION 06/09/2008  . HYPERTENSION, BENIGN ESSENTIAL 06/09/2008  . GERD 06/09/2008  . OSTEOARTHRITIS, GENERALIZED, MULTIPLE JOINTS 06/09/2008  . MUSCLE SPASM, BACK 06/09/2008  . PERSONAL HISTORY MALIGNANT NEOPLASM PROSTATE 06/09/2008  . PERSONAL HISTORY OF MALIGNANT MELANOMA OF SKIN 06/09/2008  . ARRHYTHMIA, HX OF 06/09/2008  . Personal history of colonic polyps 06/09/2008    Netanel Yannuzzi PTA 02/21/2015, 11:33 AM  [place] [address] [address] [place], [place], [address] Phone: [phone]   Fax:  [phone]

## 2015-02-23 ENCOUNTER — Ambulatory Visit: Payer: Medicare Other | Admitting: Physical Therapy

## 2015-02-23 DIAGNOSIS — M545 Low back pain, unspecified: Secondary | ICD-10-CM

## 2015-02-23 DIAGNOSIS — R262 Difficulty in walking, not elsewhere classified: Secondary | ICD-10-CM

## 2015-02-23 NOTE — Therapy (Signed)
[place], [place], [address] Phone: [phone]   Fax:  [phone]  Physical Therapy Treatment  Patient Details  Name: [name] [name] MRN: [id number] Date of Birth: July 16, 1931 Referring Provider:  Garald Balding, MD  Encounter Date: 02/23/2015      PT End of Session - 02/23/15 1054    Visit Number 4   Date for PT Re-Evaluation 04/14/15   PT Start Time 4196   PT Stop Time 1110   PT Time Calculation (min) 56 min      Past Medical History  Diagnosis Date  . Prostate cancer   . Melanoma     Left Shoulder  . Vision abnormalities     Cornea scarring  . Osteoarthritis     Knees  . Hypertension   . Pure hypercholesterolemia   . Depressive disorder, not elsewhere classified   . MVP (mitral valve prolapse)     a. With severe MR s/p Complex valvuloplasty including artificial Gore-tex neochord placement x4, chordal transposition x1, chordal release x1, # 32 mm Sorin Memo 3D Ring Annuloplasty 2012.  Marland Kitchen Personal history of colonic polyps   . Thrombocytopenia   . Mitral regurgitation   . PVC (premature ventricular contraction)   . Neuropathy   . Adenomatous polyps   . Internal nasal lesion 05/15/2013  . PAF (paroxysmal atrial fibrillation)     a. Post-op MVR 2012.  . Pulmonary HTN     a. Mild-mod by cath 2012.  Marland Kitchen Normal coronary arteries     a. Normal coronary anatomy by cath 2012.    Past Surgical History  Procedure Laterality Date  . Nuclear stress test  09/2006    EF-64%, Normal  . US echocardiography  09/2009, 08/1011    mild LVH,mild AI,MVP with mild MR, mild-mod. TR with mild Pulm. HTN, EF-55-60%  . Inguinal hernia repair  09/2009    Left  . Knee arthroscopy       left x3  and right x2  . Colonoscopy w/ polypectomy    . Prostatectomy  1993  . Rotator cuff repair  2003    left  . Melanoma surgery      2001, 2005, 2006, 2009  . Tee without cardioversion  09/26/2011   Procedure: TRANSESOPHAGEAL ECHOCARDIOGRAM (TEE);  Surgeon: Lelon Perla, MD;  Location: [facility] ENDOSCOPY;  Service: Cardiovascular;  Laterality: N/A;  . Mitral valve repair  10/01/2011    complex valvuloplasty with Goretex cord replacement and chordal transposition 28mm Sorin Memo 3D ring annuloplasty  . Root canal  08-19-12  . Cardiac catheterization  09/2011    Pre-op for MVR -- normal coronaries.    There were no vitals filed for this visit.  Visit Diagnosis:  Midline low back pain without sciatica  Difficulty walking      Subjective Assessment - 02/23/15 1050    Subjective Reports he has times when the back pain is better, however he has elbow and knee pain that limit what he can do.   Pertinent History bilateral knee pain   Limitations Standing;Walking;House hold activities;Lifting   Patient Stated Goals no pain, and walk better                       [facility] Adult PT Treatment/Exercise - 02/23/15 0001    Bed Mobility   Bed Mobility Supine to Sit   Supine to Sit --  cues for body mechanics   Lumbar  Exercises: Stretches   Best boy 20 seconds;3 reps   Lumbar Exercises: Machines for Strengthening   Other Lumbar Machine Exercise seated row 20#, lats 20#   Lumbar Exercises: Supine   Glut Set 10 reps;2 seconds   Straight Leg Raise 10 reps;2 seconds   Large Ball Abdominal Isometric 20 reps;2 seconds   Other Supine Lumbar Exercises feet on ball KTC, rotation and bridges   Moist Heat Therapy   Number Minutes Moist Heat 15 Minutes   Moist Heat Location Other (comment)   Electrical Stimulation   Electrical Stimulation Location low back   Electrical Stimulation Parameters IFC   Electrical Stimulation Goals Pain                PT Education - 02/23/15 1053    Education provided Yes   Education Details added supine lumbar stabilization   Person(s) Educated Patient   Methods Explanation;Demonstration;Tactile cues;Handout   Comprehension  Verbalized understanding;Returned demonstration          PT Short Term Goals - 02/21/15 1112    PT SHORT TERM GOAL #1   Title independent with initial HEP   Baseline increased knee pain with current technique   Time 2   Period Weeks   Status Achieved           PT Long Term Goals - 02/23/15 1056    PT LONG TERM GOAL #2   Title increase lumbar ROM 25%   Status On-going               Plan - 02/23/15 1054    Clinical Impression Statement The knees and elbow limit some of the exercises, reports that he is having some time wthout back pain and reports that he is appreciative   Pt will benefit from skilled therapeutic intervention in order to improve on the following deficits Abnormal gait;Decreased strength;Impaired flexibility   Rehab Potential Fair   PT Frequency 2x / week   PT Duration 8 weeks   PT Treatment/Interventions Electrical Stimulation;Moist Heat;Therapeutic activities;Therapeutic exercise;Balance training;Traction   PT Next Visit Plan increase LE flexibility, L/S flexion, and lumbopelvic stabilization, watch out for knee and elbow pain   Consulted and Agree with Plan of Care Patient        Problem List Patient Active Problem List   Diagnosis Date Noted  . Encounter for therapeutic drug monitoring 05/25/2014  . Syncope 04/03/2014  . Rhabdomyolysis 04/03/2014  . UTI (urinary tract infection) 04/03/2014  . Acute renal failure 04/03/2014  . Leukocytosis 04/03/2014  . Internal nasal lesion 05/15/2013  . Low back pain 04/19/2013  . Melanoma 09/30/2012  . Cough 03/26/2012  . Hx of mitral valve repair 11/19/2011  . Encounter for long-term (current) use of anticoagulants 11/03/2011  . Decubitus skin ulcer 10/28/2011  . Pleural effusion due to congestive heart failure 10/28/2011  . Atrial fibrillation 10/07/2011  . S/P mitral valve repair 10/01/2011  . Valvular heart disease 08/21/2011  . Cerumen impaction 04/09/2011  . Hearing loss 04/09/2011  .  THROMBOCYTOPENIA 09/19/2010  . ADENOCARCINOMA, PROSTATE 09/17/2010  . CALLUS, LEFT FOOT 09/17/2010  . BACK PAIN, CHRONIC 09/17/2010  . TINEA PEDIS 05/28/2009  . DERMATITIS, ATOPIC 04/10/2009  . HYPERCHOLESTEROLEMIA 06/09/2008  . DEPRESSION 06/09/2008  . HYPERTENSION, BENIGN ESSENTIAL 06/09/2008  . GERD 06/09/2008  . OSTEOARTHRITIS, GENERALIZED, MULTIPLE JOINTS 06/09/2008  . MUSCLE SPASM, BACK 06/09/2008  . PERSONAL HISTORY MALIGNANT NEOPLASM PROSTATE 06/09/2008  . PERSONAL HISTORY OF MALIGNANT MELANOMA OF SKIN 06/09/2008  . ARRHYTHMIA, HX OF  06/09/2008  . Personal history of colonic polyps 06/09/2008    Sumner Boast, PT 02/23/2015, 10:57 AM  [place] [address] [place], [place], [address] Phone: [phone]   Fax:  [phone]

## 2015-02-27 ENCOUNTER — Ambulatory Visit: Payer: Medicare Other | Admitting: Physical Therapy

## 2015-02-27 ENCOUNTER — Encounter: Payer: Self-pay | Admitting: Physical Therapy

## 2015-02-27 DIAGNOSIS — M545 Low back pain, unspecified: Secondary | ICD-10-CM

## 2015-02-27 DIAGNOSIS — R262 Difficulty in walking, not elsewhere classified: Secondary | ICD-10-CM

## 2015-02-27 NOTE — Therapy (Signed)
[place], [place], [address] Phone: [phone]   Fax:  [phone]  Physical Therapy Treatment  Patient Details  Name: [name] [name] MRN: [id number] Date of Birth: 07-Feb-1931 Referring Provider:  Garald Balding, MD  Encounter Date: 02/27/2015      PT End of Session - 02/27/15 1401    Visit Number 5   Date for PT Re-Evaluation 04/14/15   PT Start Time 1324   PT Stop Time 1405   PT Time Calculation (min) 51 min      Past Medical History  Diagnosis Date  . Prostate cancer   . Melanoma     Left Shoulder  . Vision abnormalities     Cornea scarring  . Osteoarthritis     Knees  . Hypertension   . Pure hypercholesterolemia   . Depressive disorder, not elsewhere classified   . MVP (mitral valve prolapse)     a. With severe MR s/p Complex valvuloplasty including artificial Gore-tex neochord placement x4, chordal transposition x1, chordal release x1, # 32 mm Sorin Memo 3D Ring Annuloplasty 2012.  Marland Kitchen Personal history of colonic polyps   . Thrombocytopenia   . Mitral regurgitation   . PVC (premature ventricular contraction)   . Neuropathy   . Adenomatous polyps   . Internal nasal lesion 05/15/2013  . PAF (paroxysmal atrial fibrillation)     a. Post-op MVR 2012.  . Pulmonary HTN     a. Mild-mod by cath 2012.  Marland Kitchen Normal coronary arteries     a. Normal coronary anatomy by cath 2012.    Past Surgical History  Procedure Laterality Date  . Nuclear stress test  09/2006    EF-64%, Normal  . US echocardiography  09/2009, 08/1011    mild LVH,mild AI,MVP with mild MR, mild-mod. TR with mild Pulm. HTN, EF-55-60%  . Inguinal hernia repair  09/2009    Left  . Knee arthroscopy       left x3  and right x2  . Colonoscopy w/ polypectomy    . Prostatectomy  1993  . Rotator cuff repair  2003    left  . Melanoma surgery      2001, 2005, 2006, 2009  . Tee without cardioversion  09/26/2011   Procedure: TRANSESOPHAGEAL ECHOCARDIOGRAM (TEE);  Surgeon: Lelon Perla, MD;  Location: [facility] ENDOSCOPY;  Service: Cardiovascular;  Laterality: N/A;  . Mitral valve repair  10/01/2011    complex valvuloplasty with Goretex cord replacement and chordal transposition 85mm Sorin Memo 3D ring annuloplasty  . Root canal  08-19-12  . Cardiac catheterization  09/2011    Pre-op for MVR -- normal coronaries.    There were no vitals filed for this visit.  Visit Diagnosis:  Midline low back pain without sciatica  Difficulty walking      Subjective Assessment - 02/27/15 1315    Subjective I thought the session the other day was good.  Elbow still hurts   Patient Stated Goals no pain, and walk better   Currently in Pain? Yes   Pain Score 5    Pain Location Back   Pain Orientation Lower   Pain Descriptors / Indicators Aching;Sharp   Pain Type Chronic pain   Pain Onset More than a month ago   Pain Frequency Constant   Aggravating Factors  standing and bending   Pain Relieving Factors rest   Effect of Pain on Daily Activities limits my ability to do much  [facility] Adult PT Treatment/Exercise - 02/27/15 0001    Lumbar Exercises: Stretches   Passive Hamstring Stretch 20 seconds;3 reps   Piriformis Stretch 5 reps;20 seconds   Lumbar Exercises: Aerobic   Tread Mill Nustep Level 1 x 5 minutes   Lumbar Exercises: Machines for Strengthening   Other Lumbar Machine Exercise seated row 20#, lats 20#   Lumbar Exercises: Seated   Other Seated Lumbar Exercises pelvic tilts   Lumbar Exercises: Supine   Glut Set 10 reps;2 seconds   Straight Leg Raise 10 reps;2 seconds   Large Ball Abdominal Isometric 20 reps;2 seconds   Other Supine Lumbar Exercises feet on ball KTC, rotation and bridges   Moist Heat Therapy   Number Minutes Moist Heat 15 Minutes   Moist Heat Location Other (comment)   Electrical Stimulation   Electrical Stimulation Location low back   Electrical  Stimulation Parameters IFC   Electrical Stimulation Goals Pain                  PT Short Term Goals - 02/21/15 1112    PT SHORT TERM GOAL #1   Title independent with initial HEP   Baseline increased knee pain with current technique   Time 2   Period Weeks   Status Achieved           PT Long Term Goals - 02/23/15 1056    PT LONG TERM GOAL #2   Title increase lumbar ROM 25%   Status On-going               Plan - 02/27/15 1404    Clinical Impression Statement I think the last treatment helped.     PT Treatment/Interventions Electrical Stimulation;Moist Heat;Therapeutic activities;Therapeutic exercise;Balance training;Traction   PT Next Visit Plan continue to try and add exercises   Consulted and Agree with Plan of Care Patient        Problem List Patient Active Problem List   Diagnosis Date Noted  . Encounter for therapeutic drug monitoring 05/25/2014  . Syncope 04/03/2014  . Rhabdomyolysis 04/03/2014  . UTI (urinary tract infection) 04/03/2014  . Acute renal failure 04/03/2014  . Leukocytosis 04/03/2014  . Internal nasal lesion 05/15/2013  . Low back pain 04/19/2013  . Melanoma 09/30/2012  . Cough 03/26/2012  . Hx of mitral valve repair 11/19/2011  . Encounter for long-term (current) use of anticoagulants 11/03/2011  . Decubitus skin ulcer 10/28/2011  . Pleural effusion due to congestive heart failure 10/28/2011  . Atrial fibrillation 10/07/2011  . S/P mitral valve repair 10/01/2011  . Valvular heart disease 08/21/2011  . Cerumen impaction 04/09/2011  . Hearing loss 04/09/2011  . THROMBOCYTOPENIA 09/19/2010  . ADENOCARCINOMA, PROSTATE 09/17/2010  . CALLUS, LEFT FOOT 09/17/2010  . BACK PAIN, CHRONIC 09/17/2010  . TINEA PEDIS 05/28/2009  . DERMATITIS, ATOPIC 04/10/2009  . HYPERCHOLESTEROLEMIA 06/09/2008  . DEPRESSION 06/09/2008  . HYPERTENSION, BENIGN ESSENTIAL 06/09/2008  . GERD 06/09/2008  . OSTEOARTHRITIS, GENERALIZED, MULTIPLE JOINTS  06/09/2008  . MUSCLE SPASM, BACK 06/09/2008  . PERSONAL HISTORY MALIGNANT NEOPLASM PROSTATE 06/09/2008  . PERSONAL HISTORY OF MALIGNANT MELANOMA OF SKIN 06/09/2008  . ARRHYTHMIA, HX OF 06/09/2008  . Personal history of colonic polyps 06/09/2008    Sumner Boast, PT 02/27/2015, 2:05 PM  [place] [address] [place], [place], [address] Phone: [phone]   Fax:  [phone]

## 2015-03-02 ENCOUNTER — Encounter: Payer: Self-pay | Admitting: Physical Therapy

## 2015-03-02 ENCOUNTER — Ambulatory Visit: Payer: Medicare Other | Admitting: Physical Therapy

## 2015-03-02 DIAGNOSIS — M545 Low back pain, unspecified: Secondary | ICD-10-CM

## 2015-03-02 DIAGNOSIS — R262 Difficulty in walking, not elsewhere classified: Secondary | ICD-10-CM

## 2015-03-02 NOTE — Therapy (Signed)
[place], [place], [address] Phone: [phone]   Fax:  [phone]  Physical Therapy Treatment  Patient Details  Name: [name] [name] MRN: [id number] Date of Birth: August 21, 1931 Referring Provider:  Garald Balding, MD  Encounter Date: 03/02/2015      PT End of Session - 03/02/15 1047    Visit Number 6   Date for PT Re-Evaluation 04/14/15   PT Start Time 1014   PT Stop Time 1110   PT Time Calculation (min) 56 min      Past Medical History  Diagnosis Date  . Prostate cancer   . Melanoma     Left Shoulder  . Vision abnormalities     Cornea scarring  . Osteoarthritis     Knees  . Hypertension   . Pure hypercholesterolemia   . Depressive disorder, not elsewhere classified   . MVP (mitral valve prolapse)     a. With severe MR s/p Complex valvuloplasty including artificial Gore-tex neochord placement x4, chordal transposition x1, chordal release x1, # 32 mm Sorin Memo 3D Ring Annuloplasty 2012.  Marland Kitchen Personal history of colonic polyps   . Thrombocytopenia   . Mitral regurgitation   . PVC (premature ventricular contraction)   . Neuropathy   . Adenomatous polyps   . Internal nasal lesion 05/15/2013  . PAF (paroxysmal atrial fibrillation)     a. Post-op MVR 2012.  . Pulmonary HTN     a. Mild-mod by cath 2012.  Marland Kitchen Normal coronary arteries     a. Normal coronary anatomy by cath 2012.    Past Surgical History  Procedure Laterality Date  . Nuclear stress test  09/2006    EF-64%, Normal  . US echocardiography  09/2009, 08/1011    mild LVH,mild AI,MVP with mild MR, mild-mod. TR with mild Pulm. HTN, EF-55-60%  . Inguinal hernia repair  09/2009    Left  . Knee arthroscopy       left x3  and right x2  . Colonoscopy w/ polypectomy    . Prostatectomy  1993  . Rotator cuff repair  2003    left  . Melanoma surgery      2001, 2005, 2006, 2009  . Tee without cardioversion  09/26/2011     Procedure: TRANSESOPHAGEAL ECHOCARDIOGRAM (TEE);  Surgeon: Lelon Perla, MD;  Location: [facility] ENDOSCOPY;  Service: Cardiovascular;  Laterality: N/A;  . Mitral valve repair  10/01/2011    complex valvuloplasty with Goretex cord replacement and chordal transposition 20mm Sorin Memo 3D ring annuloplasty  . Root canal  08-19-12  . Cardiac catheterization  09/2011    Pre-op for MVR -- normal coronaries.    There were no vitals filed for this visit.  Visit Diagnosis:  Midline low back pain without sciatica  Difficulty walking      Subjective Assessment - 03/02/15 1023    Subjective Back is hurting a little more today   Patient Stated Goals no pain, and walk better   Currently in Pain? Yes   Pain Score 8    Pain Location Back   Pain Orientation Lower   Pain Descriptors / Indicators Aching   Pain Type Chronic pain   Pain Onset More than a month ago                       [facility] Adult PT Treatment/Exercise - 03/02/15 0001    Lumbar Exercises: Stretches   Passive  Hamstring Stretch 20 seconds;3 reps   Lumbar Exercises: Aerobic   Tread Mill Nustep Level 1 x 5 minutes   Lumbar Exercises: Machines for Strengthening   Other Lumbar Machine Exercise seated row 20#, lats 20#   Lumbar Exercises: Standing   Other Standing Lumbar Exercises 5# pull to midline   Lumbar Exercises: Seated   Other Seated Lumbar Exercises pelvic tilts   Lumbar Exercises: Supine   Straight Leg Raise 10 reps;2 seconds   Large Ball Abdominal Isometric 20 reps;2 seconds   Other Supine Lumbar Exercises feet on ball KTC, rotation and bridges   Moist Heat Therapy   Number Minutes Moist Heat 15 Minutes   Moist Heat Location Other (comment)   Electrical Stimulation   Electrical Stimulation Location low back   Electrical Stimulation Parameters IFC   Electrical Stimulation Goals Pain                  PT Short Term Goals - 02/21/15 1112    PT SHORT TERM GOAL #1   Title independent with initial  HEP   Baseline increased knee pain with current technique   Time 2   Period Weeks   Status Achieved           PT Long Term Goals - 02/23/15 1056    PT LONG TERM GOAL #2   Title increase lumbar ROM 25%   Status On-going               Plan - 03/02/15 1047    Clinical Impression Statement More pain today.  very tight in the LE's but he does not like to stretch, has a low tolerance to stretching the LE's, will have to go slow   Pt will benefit from skilled therapeutic intervention in order to improve on the following deficits Abnormal gait;Decreased strength;Impaired flexibility   Rehab Potential Good   PT Frequency 2x / week   PT Duration 6 weeks   PT Treatment/Interventions Electrical Stimulation;Moist Heat;Therapeutic activities;Therapeutic exercise;Balance training;Traction   PT Next Visit Plan work on flexibility   Consulted and Agree with Plan of Care Patient        Problem List Patient Active Problem List   Diagnosis Date Noted  . Encounter for therapeutic drug monitoring 05/25/2014  . Syncope 04/03/2014  . Rhabdomyolysis 04/03/2014  . UTI (urinary tract infection) 04/03/2014  . Acute renal failure 04/03/2014  . Leukocytosis 04/03/2014  . Internal nasal lesion 05/15/2013  . Low back pain 04/19/2013  . Melanoma 09/30/2012  . Cough 03/26/2012  . Hx of mitral valve repair 11/19/2011  . Long term (current) use of anticoagulants 11/03/2011  . Decubitus skin ulcer 10/28/2011  . Pleural effusion due to congestive heart failure 10/28/2011  . Atrial fibrillation 10/07/2011  . S/P mitral valve repair 10/01/2011  . Valvular heart disease 08/21/2011  . Cerumen impaction 04/09/2011  . Hearing loss 04/09/2011  . THROMBOCYTOPENIA 09/19/2010  . ADENOCARCINOMA, PROSTATE 09/17/2010  . CALLUS, LEFT FOOT 09/17/2010  . BACK PAIN, CHRONIC 09/17/2010  . TINEA PEDIS 05/28/2009  . DERMATITIS, ATOPIC 04/10/2009  . HYPERCHOLESTEROLEMIA 06/09/2008  . DEPRESSION 06/09/2008   . HYPERTENSION, BENIGN ESSENTIAL 06/09/2008  . GERD 06/09/2008  . OSTEOARTHRITIS, GENERALIZED, MULTIPLE JOINTS 06/09/2008  . MUSCLE SPASM, BACK 06/09/2008  . PERSONAL HISTORY MALIGNANT NEOPLASM PROSTATE 06/09/2008  . PERSONAL HISTORY OF MALIGNANT MELANOMA OF SKIN 06/09/2008  . ARRHYTHMIA, HX OF 06/09/2008  . Personal history of colonic polyps 06/09/2008    Sumner Boast, PT 03/02/2015, 10:50 AM  [facility]  [place], [place], [address] Phone: [phone]   Fax:  [phone]

## 2015-03-05 ENCOUNTER — Ambulatory Visit: Payer: Medicare Other | Admitting: Physical Therapy

## 2015-03-05 DIAGNOSIS — M545 Low back pain, unspecified: Secondary | ICD-10-CM

## 2015-03-05 DIAGNOSIS — R262 Difficulty in walking, not elsewhere classified: Secondary | ICD-10-CM

## 2015-03-05 NOTE — Therapy (Signed)
[address] [place], [place], [address] Phone: [phone]   Fax:  [phone]  Physical Therapy Treatment  Patient Details  Name: [name] [name] MRN: [id number] Date of Birth: [birth year] Referring Provider:  Garald Balding, MD  Encounter Date: 03/05/2015      PT End of Session - 03/05/15 1135    Visit Number 7   Date for PT Re-Evaluation 04/14/15   PT Start Time 1102   PT Stop Time 1152   PT Time Calculation (min) 50 min   Activity Tolerance Patient tolerated treatment well;Patient limited by pain   Behavior During Therapy [facility] for tasks assessed/performed      Past Medical History  Diagnosis Date  . Prostate cancer   . Melanoma     Left Shoulder  . Vision abnormalities     Cornea scarring  . Osteoarthritis     Knees  . Hypertension   . Pure hypercholesterolemia   . Depressive disorder, not elsewhere classified   . MVP (mitral valve prolapse)     a. With severe MR s/p Complex valvuloplasty including artificial Gore-tex neochord placement x4, chordal transposition x1, chordal release x1, # 32 mm Sorin Memo 3D Ring Annuloplasty 2012.  Marland Kitchen Personal history of colonic polyps   . Thrombocytopenia   . Mitral regurgitation   . PVC (premature ventricular contraction)   . Neuropathy   . Adenomatous polyps   . Internal nasal lesion 05/15/2013  . PAF (paroxysmal atrial fibrillation)     a. Post-op MVR 2012.  . Pulmonary HTN     a. Mild-mod by cath 2012.  Marland Kitchen Normal coronary arteries     a. Normal coronary anatomy by cath 2012.    Past Surgical History  Procedure Laterality Date  . Nuclear stress test  09/2006    EF-64%, Normal  . US echocardiography  09/2009, 08/1011    mild LVH,mild AI,MVP with mild MR, mild-mod. TR with mild Pulm. HTN, EF-55-60%  . Inguinal hernia repair  09/2009    Left  . Knee arthroscopy       left x3  and right x2  . Colonoscopy w/ polypectomy    . Prostatectomy   1993  . Rotator cuff repair  2003    left  . Melanoma surgery      2001, 2005, 2006, 2009  . Tee without cardioversion  09/26/2011    Procedure: TRANSESOPHAGEAL ECHOCARDIOGRAM (TEE);  Surgeon: Lelon Perla, MD;  Location: [facility] ENDOSCOPY;  Service: Cardiovascular;  Laterality: N/A;  . Mitral valve repair  10/01/2011    complex valvuloplasty with Goretex cord replacement and chordal transposition 2mm Sorin Memo 3D ring annuloplasty  . Root canal  08-19-12  . Cardiac catheterization  09/2011    Pre-op for MVR -- normal coronaries.    There were no vitals filed for this visit.  Visit Diagnosis:  Midline low back pain without sciatica  Difficulty walking      Subjective Assessment - 03/05/15 1106    Subjective feels "fair."  Knees, back and elbow hurting today.  Had a bad day Saturday.  Back feels better today.   Pertinent History bilateral knee pain   Limitations Standing;Walking;House hold activities;Lifting   Currently in Pain? Yes   Pain Score 7    Pain Location Back   Pain Orientation Lower   Pain Descriptors / Indicators Aching   Pain Type Chronic pain   Pain Onset More than a month ago  Pain Frequency Constant   Aggravating Factors  standing and bending   Pain Relieving Factors rest                         [facility] Adult PT Treatment/Exercise - 03/05/15 1108    Lumbar Exercises: Aerobic   Tread Mill Nustep Level 2 x 6 minutes   Lumbar Exercises: Supine   Other Supine Lumbar Exercises feet on ball KTC, rotation and bridges   Moist Heat Therapy   Number Minutes Moist Heat 15 Minutes   Moist Heat Location Other (comment)  low back   Electrical Stimulation   Electrical Stimulation Location low back   Electrical Stimulation Parameters IFC x 15 min   Electrical Stimulation Goals Pain                PT Education - 03/05/15 1134    Education provided Yes   Education Details home TENs unit and where/how to purchase   Person(s) Educated  Patient   Methods Explanation;Handout   Comprehension Verbalized understanding          PT Short Term Goals - 02/21/15 1112    PT SHORT TERM GOAL #1   Title independent with initial HEP   Baseline increased knee pain with current technique   Time 2   Period Weeks   Status Achieved           PT Long Term Goals - 02/23/15 1056    PT LONG TERM GOAL #2   Title increase lumbar ROM 25%   Status On-going               Plan - 03/05/15 1135    Clinical Impression Statement Pt with low tolerance to activities needing rest breaks between actitivies.  Will benefit from PT to maximize function and decrease pain.   PT Next Visit Plan work on flexibility   Consulted and Agree with Plan of Care Patient        Problem List Patient Active Problem List   Diagnosis Date Noted  . Encounter for therapeutic drug monitoring 05/25/2014  . Syncope 04/03/2014  . Rhabdomyolysis 04/03/2014  . UTI (urinary tract infection) 04/03/2014  . Acute renal failure 04/03/2014  . Leukocytosis 04/03/2014  . Internal nasal lesion 05/15/2013  . Low back pain 04/19/2013  . Melanoma 09/30/2012  . Cough 03/26/2012  . Hx of mitral valve repair 11/19/2011  . Long term (current) use of anticoagulants 11/03/2011  . Decubitus skin ulcer 10/28/2011  . Pleural effusion due to congestive heart failure 10/28/2011  . Atrial fibrillation 10/07/2011  . S/P mitral valve repair 10/01/2011  . Valvular heart disease 08/21/2011  . Cerumen impaction 04/09/2011  . Hearing loss 04/09/2011  . THROMBOCYTOPENIA 09/19/2010  . ADENOCARCINOMA, PROSTATE 09/17/2010  . CALLUS, LEFT FOOT 09/17/2010  . BACK PAIN, CHRONIC 09/17/2010  . TINEA PEDIS 05/28/2009  . DERMATITIS, ATOPIC 04/10/2009  . HYPERCHOLESTEROLEMIA 06/09/2008  . DEPRESSION 06/09/2008  . HYPERTENSION, BENIGN ESSENTIAL 06/09/2008  . GERD 06/09/2008  . OSTEOARTHRITIS, GENERALIZED, MULTIPLE JOINTS 06/09/2008  . MUSCLE SPASM, BACK 06/09/2008  . PERSONAL  HISTORY MALIGNANT NEOPLASM PROSTATE 06/09/2008  . PERSONAL HISTORY OF MALIGNANT MELANOMA OF SKIN 06/09/2008  . ARRHYTHMIA, HX OF 06/09/2008  . Personal history of colonic polyps 06/09/2008   Laureen Abrahams, PT, DPT 03/05/2015 11:52 AM  [address] [place], [place], [address] Phone: [phone]   Fax:  [phone]

## 2015-03-07 ENCOUNTER — Encounter: Payer: Self-pay | Admitting: Physical Therapy

## 2015-03-07 ENCOUNTER — Ambulatory Visit: Payer: Medicare Other | Admitting: Physical Therapy

## 2015-03-07 DIAGNOSIS — M545 Low back pain, unspecified: Secondary | ICD-10-CM

## 2015-03-07 NOTE — Therapy (Signed)
[place], [place], [address] Phone: [phone]   Fax:  [phone]  Physical Therapy Treatment  Patient Details  Name: [name] [name] MRN: [id number] Date of Birth: 1931/03/08 Referring Provider:  Garald Balding, MD  Encounter Date: 03/07/2015      PT End of Session - 03/07/15 1129    Visit Number 8   Date for PT Re-Evaluation 04/14/15   PT Start Time 1102   PT Stop Time 1153   PT Time Calculation (min) 51 min      Past Medical History  Diagnosis Date  . Prostate cancer   . Melanoma     Left Shoulder  . Vision abnormalities     Cornea scarring  . Osteoarthritis     Knees  . Hypertension   . Pure hypercholesterolemia   . Depressive disorder, not elsewhere classified   . MVP (mitral valve prolapse)     a. With severe MR s/p Complex valvuloplasty including artificial Gore-tex neochord placement x4, chordal transposition x1, chordal release x1, # 32 mm Sorin Memo 3D Ring Annuloplasty 2012.  Marland Kitchen Personal history of colonic polyps   . Thrombocytopenia   . Mitral regurgitation   . PVC (premature ventricular contraction)   . Neuropathy   . Adenomatous polyps   . Internal nasal lesion 05/15/2013  . PAF (paroxysmal atrial fibrillation)     a. Post-op MVR 2012.  . Pulmonary HTN     a. Mild-mod by cath 2012.  Marland Kitchen Normal coronary arteries     a. Normal coronary anatomy by cath 2012.    Past Surgical History  Procedure Laterality Date  . Nuclear stress test  09/2006    EF-64%, Normal  . US echocardiography  09/2009, 08/1011    mild LVH,mild AI,MVP with mild MR, mild-mod. TR with mild Pulm. HTN, EF-55-60%  . Inguinal hernia repair  09/2009    Left  . Knee arthroscopy       left x3  and right x2  . Colonoscopy w/ polypectomy    . Prostatectomy  1993  . Rotator cuff repair  2003    left  . Melanoma surgery      2001, 2005, 2006, 2009  . Tee without cardioversion  09/26/2011     Procedure: TRANSESOPHAGEAL ECHOCARDIOGRAM (TEE);  Surgeon: Lelon Perla, MD;  Location: [facility] ENDOSCOPY;  Service: Cardiovascular;  Laterality: N/A;  . Mitral valve repair  10/01/2011    complex valvuloplasty with Goretex cord replacement and chordal transposition 76m Sorin Memo 3D ring annuloplasty  . Root canal  08-19-12  . Cardiac catheterization  09/2011    Pre-op for MVR -- normal coronaries.    There were no vitals filed for this visit.  Visit Diagnosis:  Midline low back pain without sciatica      Subjective Assessment - 03/07/15 1124    Subjective Patient reports that he has good days and bad days, sometimes I feel like PT helps but I don't like to exercise                         [facility] 0001    Lumbar Exercises: Stretches   Active Hamstring Stretch 4 reps;10 seconds   Passive Hamstring Stretch 20 seconds;3 reps   Pelvic Tilt 5 reps;10 seconds   ITB Stretch 20 seconds;3 reps   Piriformis Stretch 5 reps;20 seconds   Lumbar Exercises: Aerobic  Stationary Bike 5 minutes   Tread Mill Nustep Level 2 x 6 minutes   Lumbar Exercises: Standing   Other Standing Lumbar Exercises 5# pull to midline   Other Standing Lumbar Exercises 5# scap stabilization   Lumbar Exercises: Seated   Other Seated Lumbar Exercises pelvic mobility and stability exercises   Lumbar Exercises: Supine   Other Supine Lumbar Exercises feet on ball KTC, rotation and bridges   Moist Heat Therapy   Number Minutes Moist Heat 15 Minutes   Moist Heat Location Other (comment)   Electrical Stimulation   Electrical Stimulation Location low back   Electrical Stimulation Parameters IFC   Electrical Stimulation Goals Pain                  PT Short Term Goals - 02/21/15 1112    PT SHORT TERM GOAL #1   Title independent with initial HEP   Baseline increased knee pain with current technique   Time 2   Period Weeks   Status Achieved            PT Long Term Goals - 03/07/15 1135    PT LONG TERM GOAL #2   Title increase lumbar ROM 25%   Status Achieved   PT LONG TERM GOAL #3   Title decrease pain 50%   Status Partially Met               Plan - 03/07/15 1133    Clinical Impression Statement Patient has good and bad days, reports that he is worst when he gets up in the morning.   PT Treatment/Interventions Electrical Stimulation;Moist Heat;Therapeutic activities;Therapeutic exercise;Balance training;Traction   PT Next Visit Plan We will hold for a week or two and have him try exercises at home and at gym, he will be able to return to [place] if needed prior to 04/14/15   Consulted and Agree with Plan of Care Patient        Problem List Patient Active Problem List   Diagnosis Date Noted  . Encounter for therapeutic drug monitoring 05/25/2014  . Syncope 04/03/2014  . Rhabdomyolysis 04/03/2014  . UTI (urinary tract infection) 04/03/2014  . Acute renal failure 04/03/2014  . Leukocytosis 04/03/2014  . Internal nasal lesion 05/15/2013  . Low back pain 04/19/2013  . Melanoma 09/30/2012  . Cough 03/26/2012  . Hx of mitral valve repair 11/19/2011  . Long term (current) use of anticoagulants 11/03/2011  . Decubitus skin ulcer 10/28/2011  . Pleural effusion due to congestive heart failure 10/28/2011  . Atrial fibrillation 10/07/2011  . S/P mitral valve repair 10/01/2011  . Valvular heart disease 08/21/2011  . Cerumen impaction 04/09/2011  . Hearing loss 04/09/2011  . THROMBOCYTOPENIA 09/19/2010  . ADENOCARCINOMA, PROSTATE 09/17/2010  . CALLUS, LEFT FOOT 09/17/2010  . BACK PAIN, CHRONIC 09/17/2010  . TINEA PEDIS 05/28/2009  . DERMATITIS, ATOPIC 04/10/2009  . HYPERCHOLESTEROLEMIA 06/09/2008  . DEPRESSION 06/09/2008  . HYPERTENSION, BENIGN ESSENTIAL 06/09/2008  . GERD 06/09/2008  . OSTEOARTHRITIS, GENERALIZED, MULTIPLE JOINTS 06/09/2008  . MUSCLE SPASM, BACK 06/09/2008  . PERSONAL HISTORY MALIGNANT NEOPLASM  PROSTATE 06/09/2008  . PERSONAL HISTORY OF MALIGNANT MELANOMA OF SKIN 06/09/2008  . ARRHYTHMIA, HX OF 06/09/2008  . Personal history of colonic polyps 06/09/2008    Sumner Boast, PT 03/07/2015, 11:36 AM  [place] [address] [place], [place], [address] Phone: [phone]   Fax:  [phone]

## 2015-03-08 ENCOUNTER — Ambulatory Visit (INDEPENDENT_AMBULATORY_CARE_PROVIDER_SITE_OTHER): Payer: Medicare Other | Admitting: *Deleted

## 2015-03-08 DIAGNOSIS — I480 Paroxysmal atrial fibrillation: Secondary | ICD-10-CM | POA: Diagnosis not present

## 2015-03-08 DIAGNOSIS — Z5181 Encounter for therapeutic drug level monitoring: Secondary | ICD-10-CM

## 2015-03-08 DIAGNOSIS — Z7901 Long term (current) use of anticoagulants: Secondary | ICD-10-CM | POA: Diagnosis not present

## 2015-03-08 DIAGNOSIS — I4891 Unspecified atrial fibrillation: Secondary | ICD-10-CM | POA: Diagnosis not present

## 2015-03-08 LAB — POCT INR: INR: 3.4

## 2015-03-16 ENCOUNTER — Other Ambulatory Visit: Payer: Self-pay | Admitting: Orthopaedic Surgery

## 2015-03-16 DIAGNOSIS — M545 Low back pain: Secondary | ICD-10-CM

## 2015-03-20 ENCOUNTER — Ambulatory Visit
Admission: RE | Admit: 2015-03-20 | Discharge: 2015-03-20 | Disposition: A | Discharge status: Home / Self Care | Payer: Medicare Other | Source: Ambulatory Visit | Attending: Orthopaedic Surgery | Admitting: Orthopaedic Surgery

## 2015-03-20 DIAGNOSIS — M545 Low back pain: Secondary | ICD-10-CM

## 2015-03-27 ENCOUNTER — Ambulatory Visit (INDEPENDENT_AMBULATORY_CARE_PROVIDER_SITE_OTHER): Payer: Medicare Other | Admitting: *Deleted

## 2015-03-27 DIAGNOSIS — I4891 Unspecified atrial fibrillation: Secondary | ICD-10-CM | POA: Diagnosis not present

## 2015-03-27 DIAGNOSIS — Z7901 Long term (current) use of anticoagulants: Secondary | ICD-10-CM

## 2015-03-27 DIAGNOSIS — Z5181 Encounter for therapeutic drug level monitoring: Secondary | ICD-10-CM | POA: Diagnosis not present

## 2015-03-27 DIAGNOSIS — I480 Paroxysmal atrial fibrillation: Secondary | ICD-10-CM

## 2015-03-27 LAB — POCT INR: INR: 3

## 2015-04-09 ENCOUNTER — Other Ambulatory Visit: Payer: Self-pay | Admitting: Cardiology

## 2015-04-24 ENCOUNTER — Ambulatory Visit (INDEPENDENT_AMBULATORY_CARE_PROVIDER_SITE_OTHER): Payer: Medicare Other | Admitting: *Deleted

## 2015-04-24 DIAGNOSIS — Z5181 Encounter for therapeutic drug level monitoring: Secondary | ICD-10-CM

## 2015-04-24 DIAGNOSIS — Z7901 Long term (current) use of anticoagulants: Secondary | ICD-10-CM | POA: Diagnosis not present

## 2015-04-24 DIAGNOSIS — I4891 Unspecified atrial fibrillation: Secondary | ICD-10-CM

## 2015-04-24 DIAGNOSIS — I480 Paroxysmal atrial fibrillation: Secondary | ICD-10-CM

## 2015-04-24 LAB — POCT INR: INR: 4.1

## 2015-05-08 ENCOUNTER — Ambulatory Visit (INDEPENDENT_AMBULATORY_CARE_PROVIDER_SITE_OTHER): Payer: Medicare Other | Admitting: *Deleted

## 2015-05-08 DIAGNOSIS — I4891 Unspecified atrial fibrillation: Secondary | ICD-10-CM | POA: Diagnosis not present

## 2015-05-08 DIAGNOSIS — Z5181 Encounter for therapeutic drug level monitoring: Secondary | ICD-10-CM | POA: Diagnosis not present

## 2015-05-08 DIAGNOSIS — Z7901 Long term (current) use of anticoagulants: Secondary | ICD-10-CM | POA: Diagnosis not present

## 2015-05-08 DIAGNOSIS — I480 Paroxysmal atrial fibrillation: Secondary | ICD-10-CM

## 2015-05-08 LAB — POCT INR: INR: 2.6

## 2015-05-28 ENCOUNTER — Ambulatory Visit (INDEPENDENT_AMBULATORY_CARE_PROVIDER_SITE_OTHER): Payer: Medicare Other | Admitting: *Deleted

## 2015-05-28 DIAGNOSIS — Z5181 Encounter for therapeutic drug level monitoring: Secondary | ICD-10-CM | POA: Diagnosis not present

## 2015-05-28 DIAGNOSIS — I480 Paroxysmal atrial fibrillation: Secondary | ICD-10-CM

## 2015-05-28 DIAGNOSIS — I4891 Unspecified atrial fibrillation: Secondary | ICD-10-CM

## 2015-05-28 DIAGNOSIS — Z7901 Long term (current) use of anticoagulants: Secondary | ICD-10-CM

## 2015-05-28 LAB — POCT INR: INR: 4.7

## 2015-05-29 ENCOUNTER — Other Ambulatory Visit: Payer: Self-pay | Admitting: Physician Assistant

## 2015-06-01 ENCOUNTER — Telehealth: Payer: Self-pay | Admitting: *Deleted

## 2015-06-01 NOTE — Telephone Encounter (Signed)
Patient called to inform us that he was prescribed Amoxicillin 500 mg four times a day by his dentist today.  Advised that Amoxicillin does not interfere with coumadin and is safe to take.  He verbalized understanding and was advised to call with any other issues.

## 2015-06-13 ENCOUNTER — Ambulatory Visit (INDEPENDENT_AMBULATORY_CARE_PROVIDER_SITE_OTHER): Payer: Medicare Other | Admitting: Pharmacist

## 2015-06-13 ENCOUNTER — Encounter: Payer: Self-pay | Admitting: Cardiology

## 2015-06-13 ENCOUNTER — Ambulatory Visit (INDEPENDENT_AMBULATORY_CARE_PROVIDER_SITE_OTHER): Payer: Medicare Other | Admitting: Cardiology

## 2015-06-13 VITALS — BP 98/60 | HR 72 | Ht 77.0 in | Wt 192.0 lb

## 2015-06-13 DIAGNOSIS — Z9889 Other specified postprocedural states: Secondary | ICD-10-CM | POA: Diagnosis not present

## 2015-06-13 DIAGNOSIS — Z5181 Encounter for therapeutic drug level monitoring: Secondary | ICD-10-CM

## 2015-06-13 DIAGNOSIS — K5900 Constipation, unspecified: Secondary | ICD-10-CM

## 2015-06-13 DIAGNOSIS — I4891 Unspecified atrial fibrillation: Secondary | ICD-10-CM

## 2015-06-13 DIAGNOSIS — Z7901 Long term (current) use of anticoagulants: Secondary | ICD-10-CM | POA: Diagnosis not present

## 2015-06-13 DIAGNOSIS — I10 Essential (primary) hypertension: Secondary | ICD-10-CM | POA: Diagnosis not present

## 2015-06-13 DIAGNOSIS — E7800 Pure hypercholesterolemia, unspecified: Secondary | ICD-10-CM

## 2015-06-13 DIAGNOSIS — I480 Paroxysmal atrial fibrillation: Secondary | ICD-10-CM

## 2015-06-13 LAB — CBC WITH DIFFERENTIAL/PLATELET
Basophils Absolute: 0 10*3/uL (ref 0.0–0.1)
Basophils Relative: 0.4 % (ref 0.0–3.0)
Eosinophils Absolute: 0 10*3/uL (ref 0.0–0.7)
Eosinophils Relative: 0.1 % (ref 0.0–5.0)
HCT: 45.2 % (ref 39.0–52.0)
Hemoglobin: 15.4 g/dL (ref 13.0–17.0)
LYMPHS ABS: 1.3 10*3/uL (ref 0.7–4.0)
LYMPHS PCT: 18.2 % (ref 12.0–46.0)
MCHC: 34 g/dL (ref 30.0–36.0)
MCV: 91.5 fl (ref 78.0–100.0)
Monocytes Absolute: 1.1 10*3/uL — ABNORMAL HIGH (ref 0.1–1.0)
Monocytes Relative: 15.5 % — ABNORMAL HIGH (ref 3.0–12.0)
NEUTROS ABS: 4.5 10*3/uL (ref 1.4–7.7)
NEUTROS PCT: 65.8 % (ref 43.0–77.0)
PLATELETS: 85 10*3/uL — AB (ref 150.0–400.0)
RBC: 4.94 Mil/uL (ref 4.22–5.81)
RDW: 13.1 % (ref 11.5–15.5)
WBC: 6.9 10*3/uL (ref 4.0–10.5)

## 2015-06-13 LAB — LIPID PANEL
CHOL/HDL RATIO: 3
Cholesterol: 142 mg/dL (ref 0–200)
HDL: 43.2 mg/dL (ref >39.00)
LDL CALC: 81 mg/dL (ref 0–99)
NonHDL: 98.8
TRIGLYCERIDES: 90 mg/dL (ref 0.0–149.0)
VLDL: 18 mg/dL (ref 0.0–40.0)

## 2015-06-13 LAB — BASIC METABOLIC PANEL
BUN: 25 mg/dL — ABNORMAL HIGH (ref 6–23)
CO2: 30 mEq/L (ref 19–32)
CREATININE: 0.98 mg/dL (ref 0.40–1.50)
Calcium: 9.6 mg/dL (ref 8.4–10.5)
Chloride: 105 mEq/L (ref 96–112)
GFR: 77.79 mL/min (ref >60.00)
Glucose, Bld: 112 mg/dL — ABNORMAL HIGH (ref 70–99)
Potassium: 4.1 mEq/L (ref 3.5–5.1)
SODIUM: 140 meq/L (ref 135–145)

## 2015-06-13 LAB — HEPATIC FUNCTION PANEL
ALT: 18 U/L (ref 0–53)
AST: 20 U/L (ref 0–37)
Albumin: 4 g/dL (ref 3.5–5.2)
Alkaline Phosphatase: 42 U/L (ref 39–117)
BILIRUBIN TOTAL: 0.9 mg/dL (ref 0.2–1.2)
Bilirubin, Direct: 0.2 mg/dL (ref 0.0–0.3)
Total Protein: 6.8 g/dL (ref 6.0–8.3)

## 2015-06-13 LAB — POCT INR: INR: 2.5

## 2015-06-13 NOTE — Progress Notes (Signed)
Cardiology Office Note   Date:  06/13/2015   ID:  [name] [name], DOB 08-Jul-1931, MRN [id number]  PCP:  Penni Homans, MD  Cardiologist: Darlin Coco MD  No chief complaint on file.     History of Present Illness: [name] [name] is a 81 y.o. male who presents for a four-month office follow-up. This pleasant 81 year old gentleman is seen for a scheduled followup office visit. He has a past history of severe mitral valve prolapse. He underwent mitral valve repair by Dr. Roxy Manns on 10/01/11. He did not have any coronary disease. He had a prolonged hospital course but eventually recovered and is now doing well from a cardiac standpoint.  In may 2015 the patient had an episode of syncope at home secondary to dehydration and a urinary tract infection. He had atrial fibrillation recurrence felt to be secondary to the stress of the UTI. He is now back in normal sinus rhythm and on warfarin. His echocardiogram 04/03/14 showed an ejection fraction of 50% and mitral valve repair looked good. he has less physically active. He states that both of his knees are "shot" any also has painful arthritis in his low back. He now has to use a cane and he has installed an electric staircase in his home. He is no longer able to do yard work because he cannot bend over to pick up things. His arthritis is followed by Dr. Durward Fortes. He has been told that he has spinal stenosis.  He has also been referred to Dr. Ernestina Patches. He has not been passing any chest pain. He's had no more syncope. He notes occasional irregular heartbeat. He denies exertional dyspnea. He has had some recent increase in constipation.  He is not taking any increase in pain medication.  His last colonoscopy was approximate 2 years ago.  The patient does take MiraLAX and Metamucil. He feels like he is getting plenty of exercise.  He walks in stores where it is air conditioned.  He does this on a daily basis.   Past Medical History    Diagnosis Date  . Prostate cancer   . Melanoma     Left Shoulder  . Vision abnormalities     Cornea scarring  . Osteoarthritis     Knees  . Hypertension   . Pure hypercholesterolemia   . Depressive disorder, not elsewhere classified   . MVP (mitral valve prolapse)     a. With severe MR s/p Complex valvuloplasty including artificial Gore-tex neochord placement x4, chordal transposition x1, chordal release x1, # 32 mm Sorin Memo 3D Ring Annuloplasty 2012.  Marland Kitchen Personal history of colonic polyps   . Thrombocytopenia   . Mitral regurgitation   . PVC (premature ventricular contraction)   . Neuropathy   . Adenomatous polyps   . Internal nasal lesion 05/15/2013  . PAF (paroxysmal atrial fibrillation)     a. Post-op MVR 2012.  . Pulmonary HTN     a. Mild-mod by cath 2012.  Marland Kitchen Normal coronary arteries     a. Normal coronary anatomy by cath 2012.    Past Surgical History  Procedure Laterality Date  . Nuclear stress test  09/2006    EF-64%, Normal  . US echocardiography  09/2009, 08/1011    mild LVH,mild AI,MVP with mild MR, mild-mod. TR with mild Pulm. HTN, EF-55-60%  . Inguinal hernia repair  09/2009    Left  . Knee arthroscopy       left x3  and right x2  .  Colonoscopy w/ polypectomy    . Prostatectomy  1993  . Rotator cuff repair  2003    left  . Melanoma surgery      2001, 2005, 2006, 2009  . Tee without cardioversion  09/26/2011    Procedure: TRANSESOPHAGEAL ECHOCARDIOGRAM (TEE);  Surgeon: Lelon Perla, MD;  Location: [facility] ENDOSCOPY;  Service: Cardiovascular;  Laterality: N/A;  . Mitral valve repair  10/01/2011    complex valvuloplasty with Goretex cord replacement and chordal transposition 25mm Sorin Memo 3D ring annuloplasty  . Root canal  08-19-12  . Cardiac catheterization  09/2011    Pre-op for MVR -- normal coronaries.     Current Outpatient Prescriptions  Medication Sig Dispense Refill  . acetaminophen (TYLENOL) 325 MG tablet Take 325 mg by mouth every 6 (six)  hours as needed. For pain    . diltiazem (CARDIZEM CD) 300 MG 24 hr capsule Take 1 capsule (300 mg total) by mouth daily. 90 capsule 3  . docusate sodium (COLACE) 100 MG capsule Take 100 mg by mouth as directed. 2  DAILY    . furosemide (LASIX) 40 MG tablet TAKE 1 TABLET DAILY 90 tablet 2  . Glucosamine-Chondroitin (GLUCOSAMINE CHONDR COMPLEX PO) Take by mouth 3 (three) times daily.    Marland Kitchen lactose free nutrition (BOOST) LIQD Take 1 Container by mouth daily.    . metoprolol succinate (TOPROL-XL) 50 MG 24 hr tablet Take 1 tablet (50 mg total) by mouth daily. 90 tablet 3  . Multiple Vitamins-Minerals (MULTIVITAMINS THER. W/MINERALS) TABS Take 1 tablet by mouth daily. 30 each   . potassium chloride SA (K-DUR,KLOR-CON) 20 MEQ tablet Take 2 tablets (40 mEq total) by mouth daily. 180 tablet 3  . RESTASIS 0.05 % ophthalmic emulsion Place 1 drop into both eyes every 12 (twelve) hours.     . rosuvastatin (CRESTOR) 5 MG tablet Take 1 tablet (5 mg total) by mouth daily. 90 tablet 3  . traZODone (DESYREL) 100 MG tablet Take 300 mg by mouth at bedtime.     . vitamin C (ASCORBIC ACID) 500 MG tablet Take 500 mg by mouth 2 (two) times daily.    Marland Kitchen warfarin (COUMADIN) 5 MG tablet TAKE 1 TABLET AS DIRECTED 90 tablet 1  . [DISCONTINUED] pantoprazole (PROTONIX) 40 MG tablet Take 1 tablet (40 mg total) by mouth daily before breakfast.     No current facility-administered medications for this visit.    Allergies:   Review of patient's allergies indicates no known allergies.    Social History:  The patient  reports that he has never smoked. He has never used smokeless tobacco. He reports that he does not drink alcohol or use illicit drugs.   Family History:  The patient's family history includes Arthritis in his mother; Clotting disorder in his brother; Hypertension in his father and mother; Psychosis in his father; Stroke in his mother. There is no history of Colon cancer or Stomach cancer.    ROS:  Please see the  history of present illness.   Otherwise, review of systems are positive for none.   All other systems are reviewed and negative.    PHYSICAL EXAM: VS:  BP 98/60 mmHg  Pulse 72  Ht 6\' 5"  (1.956 m)  Wt 192 lb (87.091 kg)  BMI 22.76 kg/m2 , BMI Body mass index is 22.76 kg/(m^2). GEN: Well nourished, well developed, in no acute distress HEENT: normal Neck: no JVD, carotid bruits, or masses Cardiac: RRR; no murmurs, rubs, or gallops,no edema  Respiratory:  clear to auscultation bilaterally, normal work of breathing GI: soft, nontender, nondistended, + BS MS: no deformity or atrophy Skin: warm and dry, no rash Neuro:  Strength and sensation are intact Psych: euthymic mood, full affect   EKG:  EKG is ordered today. The ekg ordered today demonstrates normal sinus rhythm with sinus arrhythmia and first-degree AV block and occasional PVC   Recent Labs: 06/13/2015: ALT 18; BUN 25*; Creatinine, Ser 0.98; Hemoglobin 15.4; Platelets 85.0*; Potassium 4.1; Sodium 140    Lipid Panel    Component Value Date/Time   CHOL 142 06/13/2015 1030   TRIG 90.0 06/13/2015 1030   HDL 43.20 06/13/2015 1030   CHOLHDL 3 06/13/2015 1030   VLDL 18.0 06/13/2015 1030   LDLCALC 81 06/13/2015 1030      Wt Readings from Last 3 Encounters:  06/13/15 192 lb (87.091 kg)  02/08/15 195 lb 1.9 oz (88.506 kg)  01/11/15 199 lb 9.6 oz (90.538 kg)       ASSESSMENT AND PLAN:  1. paroxysmal atrial fibrillation currently maintaining normal sinus rhythm on beta blocker and diltiazem and warfarin 2. status post mitral valve repair for mitral valve prolapse 2012 3. history of recurrent urinary tract infections. Resolved 4. hypercholesterolemia. Tolerating low dose Crestor 5. past history of malignant melanoma followed by Dr. Wilhemina Bonito 6. history of prostate cancer followed by Dr. Risa Grill. 7. Arthritis of low back and spinal stenosis and severe arthritis of both knees, followed by Dr. Durward Fortes 8.   Constipation  Disposition continue same medication.  We will add Colace 100 mg 2 daily to help with constipation. Blood work today is pending. Recheck in 4 months for office visit lipid panel hepatic function panel and basal metabolic panel.   Current medicines are reviewed at length with the patient today.  The patient does not have concerns regarding medicines.  The following changes have been made:  no change  Labs/ tests ordered today include:   Orders Placed This Encounter  Procedures  . CBC with Differential/Platelet  . Lipid panel  . Hepatic function panel  . Basic metabolic panel  . EKG 12-Lead       SignedDarlin Coco MD 06/13/2015 12:59 PM    [facility] [address], [place], [place]  [address] Phone: [phone]; Fax: [phone]

## 2015-06-13 NOTE — Progress Notes (Signed)
Quick Note:  Please report to patient. The recent labs are stable. Continue same medication and careful diet. ______ 

## 2015-06-13 NOTE — Patient Instructions (Signed)
Medication Instructions:  START COLACE 2 DAILY   Labwork: LP/BMET/HFP/CBC  Testing/Procedures: NONE  Follow-Up: Your physician recommends that you schedule a follow-up appointment in: 4 months with fasting labs (lp/bmet/hfp)

## 2015-07-03 ENCOUNTER — Ambulatory Visit (INDEPENDENT_AMBULATORY_CARE_PROVIDER_SITE_OTHER): Payer: Medicare Other | Admitting: Pharmacist

## 2015-07-03 DIAGNOSIS — Z7901 Long term (current) use of anticoagulants: Secondary | ICD-10-CM

## 2015-07-03 DIAGNOSIS — I480 Paroxysmal atrial fibrillation: Secondary | ICD-10-CM | POA: Diagnosis not present

## 2015-07-03 DIAGNOSIS — Z5181 Encounter for therapeutic drug level monitoring: Secondary | ICD-10-CM | POA: Diagnosis not present

## 2015-07-03 DIAGNOSIS — I4891 Unspecified atrial fibrillation: Secondary | ICD-10-CM

## 2015-07-03 LAB — POCT INR: INR: 1.9

## 2015-07-14 ENCOUNTER — Other Ambulatory Visit: Payer: Self-pay | Admitting: Cardiology

## 2015-07-17 ENCOUNTER — Telehealth: Payer: Self-pay | Admitting: Cardiology

## 2015-07-17 ENCOUNTER — Other Ambulatory Visit: Payer: Self-pay | Admitting: *Deleted

## 2015-07-17 MED ORDER — METOPROLOL SUCCINATE ER 50 MG PO TB24: 50.0000 mg | ORAL_TABLET | Freq: Every day | ORAL | Status: DC

## 2015-07-17 NOTE — Telephone Encounter (Signed)
°  1. Which medications need to be refilled? Toprol XL 50 mg  2. Which pharmacy is medication to be sent to? [facility] in [place]  3. Do they need a 30 day or 90 day supply? 10 Days, just enough to last him until he gets his prescription from [facility]  4. Would they like a call back once the medication has been sent to the pharmacy? Yes

## 2015-07-24 ENCOUNTER — Ambulatory Visit (INDEPENDENT_AMBULATORY_CARE_PROVIDER_SITE_OTHER): Payer: Medicare Other

## 2015-07-24 DIAGNOSIS — I480 Paroxysmal atrial fibrillation: Secondary | ICD-10-CM

## 2015-07-24 DIAGNOSIS — Z5181 Encounter for therapeutic drug level monitoring: Secondary | ICD-10-CM | POA: Diagnosis not present

## 2015-07-24 DIAGNOSIS — Z7901 Long term (current) use of anticoagulants: Secondary | ICD-10-CM | POA: Diagnosis not present

## 2015-07-24 DIAGNOSIS — I4891 Unspecified atrial fibrillation: Secondary | ICD-10-CM | POA: Diagnosis not present

## 2015-07-24 LAB — POCT INR: INR: 2.2

## 2015-08-21 ENCOUNTER — Ambulatory Visit (INDEPENDENT_AMBULATORY_CARE_PROVIDER_SITE_OTHER): Payer: Medicare Other | Admitting: *Deleted

## 2015-08-21 DIAGNOSIS — Z7901 Long term (current) use of anticoagulants: Secondary | ICD-10-CM | POA: Diagnosis not present

## 2015-08-21 DIAGNOSIS — I480 Paroxysmal atrial fibrillation: Secondary | ICD-10-CM

## 2015-08-21 DIAGNOSIS — I4891 Unspecified atrial fibrillation: Secondary | ICD-10-CM

## 2015-08-21 DIAGNOSIS — Z5181 Encounter for therapeutic drug level monitoring: Secondary | ICD-10-CM | POA: Diagnosis not present

## 2015-08-21 LAB — POCT INR: INR: 2.1

## 2015-09-18 ENCOUNTER — Ambulatory Visit (INDEPENDENT_AMBULATORY_CARE_PROVIDER_SITE_OTHER): Payer: Medicare Other | Admitting: *Deleted

## 2015-09-18 DIAGNOSIS — I480 Paroxysmal atrial fibrillation: Secondary | ICD-10-CM | POA: Diagnosis not present

## 2015-09-18 DIAGNOSIS — Z7901 Long term (current) use of anticoagulants: Secondary | ICD-10-CM | POA: Diagnosis not present

## 2015-09-18 DIAGNOSIS — I4891 Unspecified atrial fibrillation: Secondary | ICD-10-CM | POA: Diagnosis not present

## 2015-09-18 DIAGNOSIS — Z5181 Encounter for therapeutic drug level monitoring: Secondary | ICD-10-CM | POA: Diagnosis not present

## 2015-09-18 LAB — POCT INR: INR: 2.7

## 2015-09-28 ENCOUNTER — Encounter: Payer: Self-pay | Admitting: Cardiology

## 2015-10-07 ENCOUNTER — Other Ambulatory Visit: Payer: Self-pay | Admitting: Cardiology

## 2015-10-08 ENCOUNTER — Other Ambulatory Visit: Payer: Self-pay | Admitting: *Deleted

## 2015-10-08 MED ORDER — DILTIAZEM HCL ER COATED BEADS 300 MG PO CP24: 300.0000 mg | ORAL_CAPSULE | Freq: Every day | ORAL | Status: DC

## 2015-10-15 ENCOUNTER — Ambulatory Visit: Payer: Medicare Other | Admitting: Cardiology

## 2015-10-15 ENCOUNTER — Ambulatory Visit (INDEPENDENT_AMBULATORY_CARE_PROVIDER_SITE_OTHER): Payer: Medicare Other

## 2015-10-15 ENCOUNTER — Other Ambulatory Visit: Payer: Medicare Other

## 2015-10-15 DIAGNOSIS — I480 Paroxysmal atrial fibrillation: Secondary | ICD-10-CM | POA: Diagnosis not present

## 2015-10-15 DIAGNOSIS — Z5181 Encounter for therapeutic drug level monitoring: Secondary | ICD-10-CM

## 2015-10-15 DIAGNOSIS — Z7901 Long term (current) use of anticoagulants: Secondary | ICD-10-CM

## 2015-10-15 DIAGNOSIS — I4891 Unspecified atrial fibrillation: Secondary | ICD-10-CM

## 2015-10-15 LAB — POCT INR: INR: 3

## 2015-10-17 ENCOUNTER — Other Ambulatory Visit: Payer: Self-pay | Admitting: Cardiology

## 2015-11-02 ENCOUNTER — Ambulatory Visit (INDEPENDENT_AMBULATORY_CARE_PROVIDER_SITE_OTHER): Payer: Medicare Other | Admitting: Cardiology

## 2015-11-02 ENCOUNTER — Other Ambulatory Visit (INDEPENDENT_AMBULATORY_CARE_PROVIDER_SITE_OTHER): Payer: Medicare Other | Admitting: *Deleted

## 2015-11-02 ENCOUNTER — Encounter: Payer: Self-pay | Admitting: Cardiology

## 2015-11-02 VITALS — BP 130/60 | HR 52 | Ht 77.0 in | Wt 193.0 lb

## 2015-11-02 DIAGNOSIS — I10 Essential (primary) hypertension: Secondary | ICD-10-CM

## 2015-11-02 DIAGNOSIS — Z9889 Other specified postprocedural states: Secondary | ICD-10-CM | POA: Diagnosis not present

## 2015-11-02 DIAGNOSIS — I480 Paroxysmal atrial fibrillation: Secondary | ICD-10-CM | POA: Diagnosis not present

## 2015-11-02 DIAGNOSIS — K5900 Constipation, unspecified: Secondary | ICD-10-CM | POA: Diagnosis not present

## 2015-11-02 LAB — BASIC METABOLIC PANEL
BUN: 24 mg/dL (ref 7–25)
CALCIUM: 9.5 mg/dL (ref 8.6–10.3)
CO2: 25 mmol/L (ref 20–31)
Chloride: 103 mmol/L (ref 98–110)
Creat: 0.97 mg/dL (ref 0.70–1.11)
Glucose, Bld: 112 mg/dL — ABNORMAL HIGH (ref 65–99)
Potassium: 4.3 mmol/L (ref 3.5–5.3)
SODIUM: 139 mmol/L (ref 135–146)

## 2015-11-02 LAB — LIPID PANEL
CHOL/HDL RATIO: 2.3 ratio (ref <=5.0)
CHOLESTEROL: 112 mg/dL — AB (ref 125–200)
HDL: 49 mg/dL (ref >=40)
LDL Cholesterol: 50 mg/dL (ref <130)
Triglycerides: 66 mg/dL (ref <150)
VLDL: 13 mg/dL (ref <30)

## 2015-11-02 LAB — HEPATIC FUNCTION PANEL
ALK PHOS: 35 U/L — AB (ref 40–115)
ALT: 20 U/L (ref 9–46)
AST: 25 U/L (ref 10–35)
Albumin: 3.9 g/dL (ref 3.6–5.1)
BILIRUBIN DIRECT: 0.3 mg/dL — AB (ref <=0.2)
Indirect Bilirubin: 0.8 mg/dL (ref 0.2–1.2)
Total Bilirubin: 1.1 mg/dL (ref 0.2–1.2)
Total Protein: 6.4 g/dL (ref 6.1–8.1)

## 2015-11-02 NOTE — Progress Notes (Signed)
Cardiology Office Note   Date:  11/02/2015   ID:  [name] [name], DOB 1931-02-18, MRN [id number]  PCP:  Penni Homans, MD  Cardiologist: Darlin Coco MD  Chief Complaint  Patient presents with  . routine office visit      History of Present Illness: [name] [name] is a 81 y.o. male who presents for 4 month follow-up visit  This pleasant 81 year old gentleman is seen for a scheduled followup office visit. He has a past history of severe mitral valve prolapse. He underwent mitral valve repair by Dr. Roxy Manns on 10/01/11. He did not have any coronary disease. He had a prolonged hospital course but eventually recovered and is now doing well from a cardiac standpoint. In may 2015 the patient had an episode of syncope at home secondary to dehydration and a urinary tract infection. He had atrial fibrillation recurrence felt to be secondary to the stress of the UTI. He is now back in normal sinus rhythm and on warfarin. His echocardiogram 04/03/14 showed an ejection fraction of 50% and mitral valve repair looked good. he has less physically active. He states that both of his knees are "shot" any also has painful arthritis in his low back. He now has to use a cane and he has installed an electric staircase in his home. He is no longer able to do yard work because he cannot bend over to pick up things. His arthritis is followed by Dr. Durward Fortes. He has been told that he has spinal stenosis. He has also been referred to Dr. Ernestina Patches.  Dr. Ernestina Patches has given him some injections for his spinal stenosis. He has not been passing any chest pain. He's had no more syncope. He notes occasional irregular heartbeat. He denies exertional dyspnea. He has had some recent increase in constipation. He is not taking any increase in pain medication. His last colonoscopy was approximate 2 years ago. The patient does take MiraLAX and Metamucil.  His constipation has resolved since we put him on a stool  softener at his last visit  He feels like he is getting plenty of exercise. He walks in stores where it is air conditioned. He does this on a daily basis. Since we last saw him he has had another malignant melanoma removed from his abdominal wall.  He states that this has been his 6 melanoma removal.   Past Medical History  Diagnosis Date  . Prostate cancer ([place])   . Melanoma ([place])     Left Shoulder  . Vision abnormalities     Cornea scarring  . Osteoarthritis     Knees  . Hypertension   . Pure hypercholesterolemia   . Depressive disorder, not elsewhere classified   . MVP (mitral valve prolapse)     a. With severe MR s/p Complex valvuloplasty including artificial Gore-tex neochord placement x4, chordal transposition x1, chordal release x1, # 32 mm Sorin Memo 3D Ring Annuloplasty 2012.  Marland Kitchen Personal history of colonic polyps   . Thrombocytopenia ([place])   . Mitral regurgitation   . PVC (premature ventricular contraction)   . Neuropathy ([place])   . Adenomatous polyps   . Internal nasal lesion 05/15/2013  . PAF (paroxysmal atrial fibrillation) ([place])     a. Post-op MVR 2012.  . Pulmonary HTN ([place])     a. Mild-mod by cath 2012.  Marland Kitchen Normal coronary arteries     a. Normal coronary anatomy by cath 2012.    Past Surgical History  Procedure Laterality Date  .  Nuclear stress test  09/2006    EF-64%, Normal  . US echocardiography  09/2009, 08/1011    mild LVH,mild AI,MVP with mild MR, mild-mod. TR with mild Pulm. HTN, EF-55-60%  . Inguinal hernia repair  09/2009    Left  . Knee arthroscopy       left x3  and right x2  . Colonoscopy w/ polypectomy    . Prostatectomy  1993  . Rotator cuff repair  2003    left  . Melanoma surgery      2001, 2005, 2006, 2009  . Tee without cardioversion  09/26/2011    Procedure: TRANSESOPHAGEAL ECHOCARDIOGRAM (TEE);  Surgeon: Lelon Perla, MD;  Location: [facility] ENDOSCOPY;  Service: Cardiovascular;  Laterality: N/A;  . Mitral valve repair  10/01/2011     complex valvuloplasty with Goretex cord replacement and chordal transposition 29mm Sorin Memo 3D ring annuloplasty  . Root canal  08-19-12  . Cardiac catheterization  09/2011    Pre-op for MVR -- normal coronaries.     Current Outpatient Prescriptions  Medication Sig Dispense Refill  . acetaminophen (TYLENOL) 325 MG tablet Take 325 mg by mouth every 6 (six) hours as needed. For pain    . diltiazem (CARDIZEM CD) 300 MG 24 hr capsule Take 1 capsule (300 mg total) by mouth daily. 90 capsule 1  . docusate sodium (COLACE) 100 MG capsule Take 200 mg by mouth daily. 2  DAILY    . furosemide (LASIX) 40 MG tablet Take 40 mg by mouth daily.    . Glucosamine-Chondroitin (GLUCOSAMINE CHONDR COMPLEX PO) Take 1 capsule by mouth 3 (three) times daily.     Marland Kitchen lactose free nutrition (BOOST) LIQD Take 1 Container by mouth daily.    . metoprolol succinate (TOPROL-XL) 50 MG 24 hr tablet Take 1 tablet (50 mg total) by mouth daily. Take with or immediately following a meal. 10 tablet 0  . Multiple Vitamins-Minerals (MULTIVITAMINS THER. W/MINERALS) TABS Take 1 tablet by mouth daily. 30 each   . potassium chloride SA (K-DUR,KLOR-CON) 20 MEQ tablet Take 2 tablets (40 mEq total) by mouth daily. 180 tablet 3  . RESTASIS 0.05 % ophthalmic emulsion Place 1 drop into both eyes every 12 (twelve) hours.     . rosuvastatin (CRESTOR) 5 MG tablet Take 1 tablet (5 mg total) by mouth daily. 90 tablet 2  . traMADol (ULTRAM) 50 MG tablet Take 50 mg by mouth as needed for moderate pain.     . traZODone (DESYREL) 100 MG tablet Take 300 mg by mouth at bedtime.     . vitamin C (ASCORBIC ACID) 500 MG tablet Take 500 mg by mouth 2 (two) times daily.    Marland Kitchen warfarin (COUMADIN) 5 MG tablet TAKE 1 TABLET AS DIRECTED 90 tablet 1  . [DISCONTINUED] pantoprazole (PROTONIX) 40 MG tablet Take 1 tablet (40 mg total) by mouth daily before breakfast.     No current facility-administered medications for this visit.    Allergies:   Review of  patient's allergies indicates no known allergies.    Social History:  The patient  reports that he has never smoked. He has never used smokeless tobacco. He reports that he does not drink alcohol or use illicit drugs.   Family History:  The patient's family history includes Arthritis in his mother; Clotting disorder in his brother; Hypertension in his father and mother; Psychosis in his father; Stroke in his mother. There is no history of Colon cancer or Stomach cancer.    ROS:  Please see the history of present illness.   Otherwise, review of systems are positive for none.   All other systems are reviewed and negative.    PHYSICAL EXAM: VS:  BP 130/60 mmHg  Pulse 52  Ht 6\' 5"  (1.956 m)  Wt 193 lb (87.544 kg)  BMI 22.88 kg/m2 , BMI Body mass index is 22.88 kg/(m^2). GEN: Well nourished, well developed, in no acute distress HEENT: normal Neck: no JVD, carotid bruits, or masses Cardiac: RRR; no murmurs, rubs, or gallops,no edema  Respiratory:  clear to auscultation bilaterally, normal work of breathing GI: soft, nontender, nondistended, + BS MS: no deformity or atrophy Skin: warm and dry, no rash Neuro:  Strength and sensation are intact Psych: euthymic mood, full affect   EKG:  EKG is not ordered today.    Recent Labs: 06/13/2015: ALT 18; BUN 25*; Creatinine, Ser 0.98; Hemoglobin 15.4; Platelets 85.0*; Potassium 4.1; Sodium 140    Lipid Panel    Component Value Date/Time   CHOL 142 06/13/2015 1030   TRIG 90.0 06/13/2015 1030   HDL 43.20 06/13/2015 1030   CHOLHDL 3 06/13/2015 1030   VLDL 18.0 06/13/2015 1030   LDLCALC 81 06/13/2015 1030      Wt Readings from Last 3 Encounters:  11/02/15 193 lb (87.544 kg)  06/13/15 192 lb (87.091 kg)  02/08/15 195 lb 1.9 oz (88.506 kg)         ASSESSMENT AND PLAN:  1. paroxysmal atrial fibrillation currently maintaining normal sinus rhythm on beta blocker and diltiazem and warfarin 2. status post mitral valve repair for  mitral valve prolapse 2012 3. history of recurrent urinary tract infections. Resolved 4. hypercholesterolemia. Tolerating low dose Crestor 5. past history of malignant melanoma followed by Dr. Wilhemina Bonito 6. history of prostate cancer followed by Dr. Risa Grill. 7. Arthritis of low back and spinal stenosis and severe arthritis of both knees, followed by Dr. Durward Fortes 8. Constipation  Disposition continue same medication. We will add Colace 100 mg 2 daily to help with constipation. Blood work today is pending. Recheck in 4 months for office visit lipid panel hepatic function panel and basal metabolic panel.   Current medicines are reviewed at length with the patient today.  The patient does not have concerns regarding medicines.  The following changes have been made:  no change  Labs/ tests ordered today include:   Orders Placed This Encounter  Procedures  . Lipid panel  . Hepatic function panel  . Basic metabolic panel   Lab work today is pending.  Recheck in 4 months for office visit and basal metabolic panel with Dr.Varanasi.   Berna Spare MD 11/02/2015 1:47 PM    [facility] [address] [place], [place], [place]  [address] Phone: [phone]; Fax: [phone]

## 2015-11-02 NOTE — Patient Instructions (Signed)
Medication Instructions:  Your physician recommends that you continue on your current medications as directed. Please refer to the Current Medication list given to you today.  Labwork: Lp/bmet/hfp  Testing/Procedures: none  Follow-Up: Your physician wants you to follow-up in: 4 month ov with Dr Glennon Hamilton will receive a reminder letter in the mail two months in advance. If you don't receive a letter, please call our office to schedule the follow-up appointment.  If you need a refill on your cardiac medications before your next appointment, please call your pharmacy.

## 2015-11-03 NOTE — Progress Notes (Signed)
Quick Note:  Please report to patient. The recent labs are stable. Continue same medication and careful diet. ______ 

## 2015-11-22 ENCOUNTER — Other Ambulatory Visit: Payer: Self-pay | Admitting: Cardiology

## 2015-11-29 ENCOUNTER — Ambulatory Visit (INDEPENDENT_AMBULATORY_CARE_PROVIDER_SITE_OTHER): Payer: Medicare Other

## 2015-11-29 DIAGNOSIS — Z7901 Long term (current) use of anticoagulants: Secondary | ICD-10-CM | POA: Diagnosis not present

## 2015-11-29 DIAGNOSIS — I480 Paroxysmal atrial fibrillation: Secondary | ICD-10-CM

## 2015-11-29 DIAGNOSIS — I4891 Unspecified atrial fibrillation: Secondary | ICD-10-CM | POA: Diagnosis not present

## 2015-11-29 DIAGNOSIS — Z5181 Encounter for therapeutic drug level monitoring: Secondary | ICD-10-CM

## 2015-11-29 LAB — POCT INR: INR: 2.5

## 2015-11-30 ENCOUNTER — Telehealth: Payer: Self-pay | Admitting: Cardiology

## 2015-11-30 NOTE — Telephone Encounter (Signed)
1) Spoke with patient and he is still having swelling in his legs and feet. When he takes extra Lasix helps. Denies any shortness of breath  2) Has been having problems with his heart rate running up 105-107 but will also drop as low as 45-50 Scheduled patient for visit next week to discuss

## 2015-11-30 NOTE — Telephone Encounter (Signed)
New message ° ° ° ° °Talk to Melinda °

## 2015-12-05 ENCOUNTER — Ambulatory Visit (INDEPENDENT_AMBULATORY_CARE_PROVIDER_SITE_OTHER): Payer: Medicare Other | Admitting: Cardiology

## 2015-12-05 ENCOUNTER — Ambulatory Visit (INDEPENDENT_AMBULATORY_CARE_PROVIDER_SITE_OTHER): Payer: Medicare Other | Admitting: Pharmacist

## 2015-12-05 ENCOUNTER — Other Ambulatory Visit: Payer: Self-pay | Admitting: Cardiology

## 2015-12-05 ENCOUNTER — Encounter: Payer: Self-pay | Admitting: Cardiology

## 2015-12-05 VITALS — BP 132/80 | HR 68 | Ht 77.0 in | Wt 199.4 lb

## 2015-12-05 DIAGNOSIS — Z5181 Encounter for therapeutic drug level monitoring: Secondary | ICD-10-CM

## 2015-12-05 DIAGNOSIS — Z7901 Long term (current) use of anticoagulants: Secondary | ICD-10-CM

## 2015-12-05 DIAGNOSIS — Z9889 Other specified postprocedural states: Secondary | ICD-10-CM

## 2015-12-05 DIAGNOSIS — I4892 Unspecified atrial flutter: Secondary | ICD-10-CM | POA: Diagnosis not present

## 2015-12-05 DIAGNOSIS — I10 Essential (primary) hypertension: Secondary | ICD-10-CM | POA: Diagnosis not present

## 2015-12-05 DIAGNOSIS — I480 Paroxysmal atrial fibrillation: Secondary | ICD-10-CM

## 2015-12-05 DIAGNOSIS — I4891 Unspecified atrial fibrillation: Secondary | ICD-10-CM | POA: Diagnosis not present

## 2015-12-05 LAB — POCT INR: INR: 2.3

## 2015-12-05 MED ORDER — AMIODARONE HCL 200 MG PO TABS: 200.0000 mg | ORAL_TABLET | Freq: Two times a day (BID) | ORAL | Status: DC

## 2015-12-05 NOTE — Progress Notes (Signed)
Cardiology Office Note   Date:  12/05/2015   ID:  [name] [name], DOB 1931/06/12, MRN [id number]  PCP:  Penni Homans, MD  Cardiologist: Darlin Coco MD  Chief Complaint  Patient presents with  . Palpitations    Patient denies chest pain, shortness of breath, le edema, claudication      History of Present Illness: [name] [name] is a 82 y.o. male who presents for a work in follow-up office visit  . He has a past history of severe mitral valve prolapse. He underwent mitral valve repair by Dr. Roxy Manns on 10/01/11. He did not have any coronary disease. He had a prolonged hospital course but eventually recovered and is now doing well from a cardiac standpoint. In may 2015 the patient had an episode of syncope at home secondary to dehydration and a urinary tract infection. He had atrial fibrillation recurrence felt to be secondary to the stress of the UTI. He subsequently went back in normal sinus rhythm and remains on warfarin. His echocardiogram 04/03/14 showed an ejection fraction of 50% and mitral valve repair looked good. He comes in now because of increased dyspnea over the past week.  He has noted that his heart seems to be faster and more irregular for the past week.  He has had some mild peripheral edema.  He has not been having any chest pain.  His EKG today shows atrial flutter with controlled variable ventricular response at 75 bpm.   Past Medical History  Diagnosis Date  . Prostate cancer ([place])   . Melanoma ([place])     Left Shoulder  . Vision abnormalities     Cornea scarring  . Osteoarthritis     Knees  . Hypertension   . Pure hypercholesterolemia   . Depressive disorder, not elsewhere classified   . MVP (mitral valve prolapse)     a. With severe MR s/p Complex valvuloplasty including artificial Gore-tex neochord placement x4, chordal transposition x1, chordal release x1, # 32 mm Sorin Memo 3D Ring Annuloplasty 2012.  Marland Kitchen Personal history of colonic polyps   .  Thrombocytopenia ([place])   . Mitral regurgitation   . PVC (premature ventricular contraction)   . Neuropathy ([place])   . Adenomatous polyps   . Internal nasal lesion 05/15/2013  . PAF (paroxysmal atrial fibrillation) ([place])     a. Post-op MVR 2012.  . Pulmonary HTN ([place])     a. Mild-mod by cath 2012.  Marland Kitchen Normal coronary arteries     a. Normal coronary anatomy by cath 2012.    Past Surgical History  Procedure Laterality Date  . Nuclear stress test  09/2006    EF-64%, Normal  . US echocardiography  09/2009, 08/1011    mild LVH,mild AI,MVP with mild MR, mild-mod. TR with mild Pulm. HTN, EF-55-60%  . Inguinal hernia repair  09/2009    Left  . Knee arthroscopy       left x3  and right x2  . Colonoscopy w/ polypectomy    . Prostatectomy  1993  . Rotator cuff repair  2003    left  . Melanoma surgery      2001, 2005, 2006, 2009  . Tee without cardioversion  09/26/2011    Procedure: TRANSESOPHAGEAL ECHOCARDIOGRAM (TEE);  Surgeon: Lelon Perla, MD;  Location: [facility] ENDOSCOPY;  Service: Cardiovascular;  Laterality: N/A;  . Mitral valve repair  10/01/2011    complex valvuloplasty with Goretex cord replacement and chordal transposition 60mm Sorin Memo 3D ring annuloplasty  .  Root canal  08-19-12  . Cardiac catheterization  09/2011    Pre-op for MVR -- normal coronaries.     Current Outpatient Prescriptions  Medication Sig Dispense Refill  . acetaminophen (TYLENOL) 325 MG tablet Take 325 mg by mouth every 6 (six) hours as needed. For pain    . diltiazem (CARDIZEM CD) 300 MG 24 hr capsule Take 1 capsule (300 mg total) by mouth daily. 90 capsule 1  . docusate sodium (COLACE) 100 MG capsule Take 200 mg by mouth daily. 2  DAILY    . furosemide (LASIX) 40 MG tablet Take 40 mg by mouth daily.    . Glucosamine-Chondroitin (GLUCOSAMINE CHONDR COMPLEX PO) Take 1 capsule by mouth 3 (three) times daily.     Marland Kitchen lactose free nutrition (BOOST) LIQD Take 1 Container by mouth daily.    . metoprolol  succinate (TOPROL-XL) 50 MG 24 hr tablet Take 1 tablet (50 mg total) by mouth daily. Take with or immediately following a meal. 10 tablet 0  . Multiple Vitamins-Minerals (MULTIVITAMINS THER. W/MINERALS) TABS Take 1 tablet by mouth daily. 30 each   . potassium chloride SA (K-DUR,KLOR-CON) 20 MEQ tablet Take 2 tablets (40 mEq total) by mouth daily. 180 tablet 3  . RESTASIS 0.05 % ophthalmic emulsion Place 1 drop into both eyes every 12 (twelve) hours.     . rosuvastatin (CRESTOR) 5 MG tablet Take 1 tablet (5 mg total) by mouth daily. 90 tablet 2  . traMADol (ULTRAM) 50 MG tablet Take 50 mg by mouth as needed for moderate pain.     . traZODone (DESYREL) 100 MG tablet Take 300 mg by mouth at bedtime.     . vitamin C (ASCORBIC ACID) 500 MG tablet Take 500 mg by mouth 2 (two) times daily.    Marland Kitchen amiodarone (PACERONE) 200 MG tablet Take 1 tablet (200 mg total) by mouth 2 (two) times daily. 60 tablet 3  . warfarin (COUMADIN) 5 MG tablet TAKE 1 TABLET AS DIRECTED 90 tablet 0  . [DISCONTINUED] pantoprazole (PROTONIX) 40 MG tablet Take 1 tablet (40 mg total) by mouth daily before breakfast.     No current facility-administered medications for this visit.    Allergies:   Review of patient's allergies indicates no known allergies.    Social History:  The patient  reports that he has never smoked. He has never used smokeless tobacco. He reports that he does not drink alcohol or use illicit drugs.   Family History:  The patient's family history includes Arthritis in his mother; Clotting disorder in his brother; Hypertension in his father and mother; Psychosis in his father; Stroke in his mother. There is no history of Colon cancer or Stomach cancer.    ROS:  Please see the history of present illness.   Otherwise, review of systems are positive for none.   All other systems are reviewed and negative.    PHYSICAL EXAM: VS:  BP 132/80 mmHg  Pulse 68  Ht 6\' 5"  (1.956 m)  Wt 199 lb 6.4 oz (90.447 kg)  BMI  23.64 kg/m2 , BMI Body mass index is 23.64 kg/(m^2). GEN: Well nourished, well developed, in no acute distress HEENT: normal Neck: no JVD, carotid bruits, or masses Cardiac: Irregularly irregular no murmurs, rubs, or gallops, mild edema Respiratory:  clear to auscultation bilaterally, normal work of breathing GI: soft, nontender, nondistended, + BS MS: no deformity or atrophy Skin: warm and dry, no rash Neuro:  Strength and sensation are intact Psych: euthymic mood,  full affect   EKG:  EKG is ordered today. The ekg ordered today demonstrates atrial flutter with very both ventricular response, since 06/13/15, atrial flutter is new   Recent Labs: 06/13/2015: Hemoglobin 15.4; Platelets 85.0* 11/02/2015: ALT 20; BUN 24; Creat 0.97; Potassium 4.3; Sodium 139    Lipid Panel    Component Value Date/Time   CHOL 112* 11/02/2015 1048   TRIG 66 11/02/2015 1048   HDL 49 11/02/2015 1048   CHOLHDL 2.3 11/02/2015 1048   VLDL 13 11/02/2015 1048   LDLCALC 50 11/02/2015 1048      Wt Readings from Last 3 Encounters:  12/05/15 199 lb 6.4 oz (90.447 kg)  11/02/15 193 lb (87.544 kg)  06/13/15 192 lb (87.091 kg)        ASSESSMENT AND PLAN:  1.  History of paroxysmal atrial fibrillation, now in atrial flutter with variable ventricular response.  Previous EKG in July 2016 had shown normal sinus rhythm. 2. status post mitral valve repair for mitral valve prolapse 2012 3. history of recurrent urinary tract infections. Resolved 4. hypercholesterolemia. Tolerating low dose Crestor 5. past history of malignant melanoma followed by Dr. Wilhemina Bonito 6. history of prostate cancer followed by Dr. Risa Grill. 7. Arthritis of low back and spinal stenosis and severe arthritis of both knees, followed by Dr. Durward Fortes 8. Constipation    Current medicines are reviewed at length with the patient today.  The patient does not have concerns regarding medicines.  The following changes have been made:  Start  amiodarone 200 mg twice a day  Labs/ tests ordered today include:   Orders Placed This Encounter  Procedures  . TSH  . CBC with Differential/Platelet  . Basic metabolic panel  . Hepatic function panel  . EKG 12-Lead     Disposition:  Start amiodarone 200 mg daily.  Check INR today.  Recheck in one week for office visit INR EKG basal metabolic panel hepatic function panel TSH and CBC.  Consider outpatient cardioversion if atrial flutter persists  Signed, Darlin Coco MD 12/05/2015 7:09 PM    [facility] [place], [place], [place]  [address] Phone: [phone]; Fax: [phone]

## 2015-12-05 NOTE — Patient Instructions (Signed)
Medication Instructions:  START AMIODARONE 200 MG TWICE A DAY   Labwork: INR TODAY   Testing/Procedures: NONE  Follow-Up: Your physician recommends that you schedule a follow-up appointment in: [place]  If you need a refill on your cardiac medications before your next appointment, please call your pharmacy.

## 2015-12-12 ENCOUNTER — Ambulatory Visit (INDEPENDENT_AMBULATORY_CARE_PROVIDER_SITE_OTHER): Payer: Medicare Other | Admitting: Cardiology

## 2015-12-12 ENCOUNTER — Encounter: Payer: Self-pay | Admitting: Cardiology

## 2015-12-12 ENCOUNTER — Ambulatory Visit (INDEPENDENT_AMBULATORY_CARE_PROVIDER_SITE_OTHER): Payer: Medicare Other | Admitting: *Deleted

## 2015-12-12 VITALS — BP 120/60 | HR 74 | Ht 77.0 in | Wt 199.0 lb

## 2015-12-12 DIAGNOSIS — I4891 Unspecified atrial fibrillation: Secondary | ICD-10-CM | POA: Diagnosis not present

## 2015-12-12 DIAGNOSIS — I10 Essential (primary) hypertension: Secondary | ICD-10-CM | POA: Diagnosis not present

## 2015-12-12 DIAGNOSIS — Z7901 Long term (current) use of anticoagulants: Secondary | ICD-10-CM | POA: Diagnosis not present

## 2015-12-12 DIAGNOSIS — I480 Paroxysmal atrial fibrillation: Secondary | ICD-10-CM | POA: Diagnosis not present

## 2015-12-12 DIAGNOSIS — I4892 Unspecified atrial flutter: Secondary | ICD-10-CM

## 2015-12-12 DIAGNOSIS — Z9889 Other specified postprocedural states: Secondary | ICD-10-CM | POA: Diagnosis not present

## 2015-12-12 DIAGNOSIS — Z5181 Encounter for therapeutic drug level monitoring: Secondary | ICD-10-CM

## 2015-12-12 LAB — BASIC METABOLIC PANEL
BUN: 22 mg/dL (ref 7–25)
CHLORIDE: 103 mmol/L (ref 98–110)
CO2: 26 mmol/L (ref 20–31)
Calcium: 9.3 mg/dL (ref 8.6–10.3)
Creat: 1.04 mg/dL (ref 0.70–1.11)
Glucose, Bld: 107 mg/dL — ABNORMAL HIGH (ref 65–99)
POTASSIUM: 4.5 mmol/L (ref 3.5–5.3)
Sodium: 138 mmol/L (ref 135–146)

## 2015-12-12 LAB — CBC WITH DIFFERENTIAL/PLATELET
BASOS ABS: 0 10*3/uL (ref 0.0–0.1)
BASOS PCT: 0 % (ref 0–1)
EOS ABS: 0 10*3/uL (ref 0.0–0.7)
Eosinophils Relative: 0 % (ref 0–5)
HEMATOCRIT: 43.7 % (ref 39.0–52.0)
Hemoglobin: 14.7 g/dL (ref 13.0–17.0)
LYMPHS PCT: 13 % (ref 12–46)
Lymphs Abs: 1.3 10*3/uL (ref 0.7–4.0)
MCH: 30 pg (ref 26.0–34.0)
MCHC: 33.6 g/dL (ref 30.0–36.0)
MCV: 89.2 fL (ref 78.0–100.0)
MONO ABS: 2.3 10*3/uL — AB (ref 0.1–1.0)
MPV: 11.3 fL (ref 8.6–12.4)
Monocytes Relative: 23 % — ABNORMAL HIGH (ref 3–12)
Neutro Abs: 6.4 10*3/uL (ref 1.7–7.7)
Neutrophils Relative %: 64 % (ref 43–77)
PLATELETS: 106 10*3/uL — AB (ref 150–400)
RBC: 4.9 MIL/uL (ref 4.22–5.81)
RDW: 13.8 % (ref 11.5–15.5)
WBC: 10 10*3/uL (ref 4.0–10.5)

## 2015-12-12 LAB — HEPATIC FUNCTION PANEL
ALK PHOS: 42 U/L (ref 40–115)
ALT: 22 U/L (ref 9–46)
AST: 21 U/L (ref 10–35)
Albumin: 3.9 g/dL (ref 3.6–5.1)
BILIRUBIN DIRECT: 0.2 mg/dL (ref <=0.2)
BILIRUBIN TOTAL: 0.8 mg/dL (ref 0.2–1.2)
Indirect Bilirubin: 0.6 mg/dL (ref 0.2–1.2)
Total Protein: 6.7 g/dL (ref 6.1–8.1)

## 2015-12-12 LAB — TSH: TSH: 1.401 u[IU]/mL (ref 0.350–4.500)

## 2015-12-12 LAB — POCT INR: INR: 3.3

## 2015-12-12 MED ORDER — FUROSEMIDE 40 MG PO TABS: 40.0000 mg | ORAL_TABLET | Freq: Two times a day (BID) | ORAL | Status: DC

## 2015-12-12 NOTE — Progress Notes (Signed)
Cardiology Office Note   Date:  12/12/2015   ID:  [name] [name], DOB [birth year], MRN [id number]  PCP:  Penni Homans, MD  Cardiologist: Darlin Coco MD  Chief Complaint  Patient presents with  . Atrial Flutter    Denies chest pain or shortness of breath, le edema, or claudication. C/o palpitations off and on      History of Present Illness: [name] [name] is a 82 y.o. male who presents for  Follow-up of his atrial flutter fibrillation.  . He has a past history of severe mitral valve prolapse. He underwent mitral valve repair by Dr. Roxy Manns on 10/01/11. He did not have any coronary disease. He had a prolonged hospital course but eventually recovered and is now doing well from a cardiac standpoint. In may 2015 the patient had an episode of syncope at home secondary to dehydration and a urinary tract infection. He had atrial fibrillation recurrence felt to be secondary to the stress of the UTI. He subsequently went back in normal sinus rhythm and remains on warfarin. His echocardiogram 04/03/14 showed an ejection fraction of 50% and mitral valve repair looked good.  several weeks ago the patient noted some increased peripheral edema and that his heart rate was irregular. He was seen a week ago which time he was noted to be in atrial flutter with variable ventricular response. started on amiodarone.  His warfarin was continued.  He states that he feels about the same today as he did last week. He has noted increased peripheral edema.  Past Medical History  Diagnosis Date  . Prostate cancer ([place])   . Melanoma ([place])     Left Shoulder  . Vision abnormalities     Cornea scarring  . Osteoarthritis     Knees  . Hypertension   . Pure hypercholesterolemia   . Depressive disorder, not elsewhere classified   . MVP (mitral valve prolapse)     a. With severe MR s/p Complex valvuloplasty including artificial Gore-tex neochord placement x4, chordal transposition x1, chordal release x1, #  32 mm Sorin Memo 3D Ring Annuloplasty 2012.  Marland Kitchen Personal history of colonic polyps   . Thrombocytopenia ([place])   . Mitral regurgitation   . PVC (premature ventricular contraction)   . Neuropathy ([place])   . Adenomatous polyps   . Internal nasal lesion 05/15/2013  . PAF (paroxysmal atrial fibrillation) ([place])     a. Post-op MVR 2012.  . Pulmonary HTN ([place])     a. Mild-mod by cath 2012.  Marland Kitchen Normal coronary arteries     a. Normal coronary anatomy by cath 2012.    Past Surgical History  Procedure Laterality Date  . Nuclear stress test  09/2006    EF-64%, Normal  . US echocardiography  09/2009, 08/1011    mild LVH,mild AI,MVP with mild MR, mild-mod. TR with mild Pulm. HTN, EF-55-60%  . Inguinal hernia repair  09/2009    Left  . Knee arthroscopy       left x3  and right x2  . Colonoscopy w/ polypectomy    . Prostatectomy  1993  . Rotator cuff repair  2003    left  . Melanoma surgery      2001, 2005, 2006, 2009  . Tee without cardioversion  09/26/2011    Procedure: TRANSESOPHAGEAL ECHOCARDIOGRAM (TEE);  Surgeon: Lelon Perla, MD;  Location: [facility] ENDOSCOPY;  Service: Cardiovascular;  Laterality: N/A;  . Mitral valve repair  10/01/2011    complex valvuloplasty with Goretex  cord replacement and chordal transposition 24mm Sorin Memo 3D ring annuloplasty  . Root canal  08-19-12  . Cardiac catheterization  09/2011    Pre-op for MVR -- normal coronaries.     Current Outpatient Prescriptions  Medication Sig Dispense Refill  . acetaminophen (TYLENOL) 325 MG tablet Take 325 mg by mouth every 6 (six) hours as needed. For pain    . amiodarone (PACERONE) 200 MG tablet Take 1 tablet (200 mg total) by mouth 2 (two) times daily. 60 tablet 3  . diltiazem (CARDIZEM CD) 300 MG 24 hr capsule Take 1 capsule (300 mg total) by mouth daily. 90 capsule 1  . docusate sodium (COLACE) 100 MG capsule Take 200 mg by mouth daily. 2  DAILY    . furosemide (LASIX) 40 MG tablet Take 1 tablet (40 mg total) by mouth  2 (two) times daily. 180 tablet 3  . Glucosamine-Chondroitin (GLUCOSAMINE CHONDR COMPLEX PO) Take 1 capsule by mouth 3 (three) times daily.     Marland Kitchen lactose free nutrition (BOOST) LIQD Take 1 Container by mouth daily.    . metoprolol succinate (TOPROL-XL) 50 MG 24 hr tablet Take 1 tablet (50 mg total) by mouth daily. Take with or immediately following a meal. 10 tablet 0  . Multiple Vitamins-Minerals (MULTIVITAMINS THER. W/MINERALS) TABS Take 1 tablet by mouth daily. 30 each   . potassium chloride SA (K-DUR,KLOR-CON) 20 MEQ tablet Take 2 tablets (40 mEq total) by mouth daily. 180 tablet 3  . RESTASIS 0.05 % ophthalmic emulsion Place 1 drop into both eyes every 12 (twelve) hours.     . rosuvastatin (CRESTOR) 5 MG tablet Take 1 tablet (5 mg total) by mouth daily. 90 tablet 2  . traMADol (ULTRAM) 50 MG tablet Take 50 mg by mouth as needed for moderate pain.     . traZODone (DESYREL) 100 MG tablet Take 300 mg by mouth at bedtime.     . vitamin C (ASCORBIC ACID) 500 MG tablet Take 500 mg by mouth 2 (two) times daily.    Marland Kitchen warfarin (COUMADIN) 5 MG tablet TAKE 1 TABLET AS DIRECTED 90 tablet 0  . [DISCONTINUED] pantoprazole (PROTONIX) 40 MG tablet Take 1 tablet (40 mg total) by mouth daily before breakfast.     No current facility-administered medications for this visit.    Allergies:   Review of patient's allergies indicates no known allergies.    Social History:  The patient  reports that he has never smoked. He has never used smokeless tobacco. He reports that he does not drink alcohol or use illicit drugs.   Family History:  The patient's family history includes Arthritis in his mother; Clotting disorder in his brother; Hypertension in his father and mother; Psychosis in his father; Stroke in his mother. There is no history of Colon cancer or Stomach cancer.    ROS:  Please see the history of present illness.   Otherwise, review of systems are positive for none.   All other systems are reviewed and  negative.    PHYSICAL EXAM: VS:  BP 120/60 mmHg  Pulse 74  Ht 6\' 5"  (1.956 m)  Wt 199 lb (90.266 kg)  BMI 23.59 kg/m2 , BMI Body mass index is 23.59 kg/(m^2). GEN: Well nourished, well developed, in no acute distress HEENT: normal Neck: no JVD, carotid bruits, or masses Cardiac:  Irregularly irregular; no murmurs, rubs, or gallops, and there is 2+ pretibial and pedal edema bilaterally Respiratory:  clear to auscultation bilaterally, normal work of breathing GI:  soft, nontender, nondistended, + BS MS: no deformity or atrophy Skin: warm and dry, no rash Neuro:  Strength and sensation are intact Psych: euthymic mood, full affect   EKG:  EKG is ordered today. The ekg ordered today demonstrates  Atrial flutter with variable ventricular response. Heart rate 74 bpm.   Recent Labs: 06/13/2015: Hemoglobin 15.4; Platelets 85.0* 11/02/2015: ALT 20; BUN 24; Creat 0.97; Potassium 4.3; Sodium 139    Lipid Panel    Component Value Date/Time   CHOL 112* 11/02/2015 1048   TRIG 66 11/02/2015 1048   HDL 49 11/02/2015 1048   CHOLHDL 2.3 11/02/2015 1048   VLDL 13 11/02/2015 1048   LDLCALC 50 11/02/2015 1048      Wt Readings from Last 3 Encounters:  12/12/15 199 lb (90.266 kg)  12/05/15 199 lb 6.4 oz (90.447 kg)  11/02/15 193 lb (87.544 kg)         ASSESSMENT AND PLAN:  1. History of paroxysmal atrial fibrillation, now in atrial flutter with variable ventricular response. Previous EKG in July 2016 had shown normal sinus rhythm. 2. status post mitral valve repair for mitral valve prolapse 2012 3. history of recurrent urinary tract infections. Resolved 4. hypercholesterolemia. Tolerating low dose Crestor 5. past history of malignant melanoma followed by Dr. Wilhemina Bonito 6. history of prostate cancer followed by Dr. Risa Grill. 7. Arthritis of low back and spinal stenosis and severe arthritis of both knees, followed by Dr. Durward Fortes 8. Constipation   Current medicines are reviewed  at length with the patient today.  The patient does not have concerns regarding medicines.  The following changes have been made:   We are increasing his furosemide to 40 mg twice a day.  Labs/ tests ordered today include:   Orders Placed This Encounter  Procedures  . EKG 12-Lead  . ECHOCARDIOGRAM COMPLETE     Disposition:   Continue amiodarone. We are checking lab work today including CBC hepatic function panel basal metabolic panel and TSH.  We will arrange for 2-D echo. He is increasing his furosemide to twice a day.  He will return 4 and INR in one week and he will return in 2 weeks for an office visit and INR. Following that we will consider outpatient cardioversion if his rhythm remains atrial flutter fibrillation.  Berna Spare MD 12/12/2015 5:28 PM    [address] [address], [place], [place]  [address] Phone: [phone]; Fax: [phone]

## 2015-12-12 NOTE — Patient Instructions (Signed)
Medication Instructions:  Your physician has recommended you make the following change in your medication:  1) INCREASE Lasix to 40 mg tablets by mouth TWICE daily   Labwork: Your physician recommends that you return for lab work in: TODAY (cbc/liver/TSH,  BMET)   Testing/Procedures: Your physician has requested that you have an echocardiogram. Echocardiography is a painless test that uses sound waves to create images of your heart. It provides your doctor with information about the size and shape of your heart and how well your heart's chambers and valves are working. This procedure takes approximately one hour. There are no restrictions for this procedure.    Follow-Up: Your physician recommends that you schedule a follow-up appointment in: 1 week PT/INR - SCHEDULED FOR 12/19/2015  Your physician recommends that you schedule a follow-up appointment in: 2 WEEKS WITH DR. Mare Ferrari AND PT/INR APPT   Any Other Special Instructions Will Be Listed Below (If Applicable).     If you need a refill on your cardiac medications before your next appointment, please call your pharmacy.

## 2015-12-13 NOTE — Progress Notes (Signed)
Quick Note:  Please report to patient. The recent labs are stable. Continue same medication and careful diet. ______ 

## 2015-12-19 ENCOUNTER — Ambulatory Visit (INDEPENDENT_AMBULATORY_CARE_PROVIDER_SITE_OTHER): Payer: Medicare Other | Admitting: Pharmacist

## 2015-12-19 DIAGNOSIS — Z7901 Long term (current) use of anticoagulants: Secondary | ICD-10-CM | POA: Diagnosis not present

## 2015-12-19 DIAGNOSIS — Z5181 Encounter for therapeutic drug level monitoring: Secondary | ICD-10-CM

## 2015-12-19 DIAGNOSIS — I4891 Unspecified atrial fibrillation: Secondary | ICD-10-CM

## 2015-12-19 DIAGNOSIS — I480 Paroxysmal atrial fibrillation: Secondary | ICD-10-CM

## 2015-12-19 LAB — POCT INR: INR: 4.3

## 2015-12-27 ENCOUNTER — Encounter: Payer: Self-pay | Admitting: Cardiology

## 2015-12-27 ENCOUNTER — Ambulatory Visit (HOSPITAL_COMMUNITY): Payer: Medicare Other | Attending: Cardiology

## 2015-12-27 ENCOUNTER — Other Ambulatory Visit: Payer: Self-pay

## 2015-12-27 ENCOUNTER — Ambulatory Visit (INDEPENDENT_AMBULATORY_CARE_PROVIDER_SITE_OTHER): Payer: Medicare Other | Admitting: Cardiology

## 2015-12-27 ENCOUNTER — Ambulatory Visit
Admission: RE | Admit: 2015-12-27 | Discharge: 2015-12-27 | Disposition: A | Discharge status: Home / Self Care | Payer: Medicare Other | Source: Ambulatory Visit | Attending: Cardiology | Admitting: Cardiology

## 2015-12-27 ENCOUNTER — Other Ambulatory Visit: Payer: Self-pay | Admitting: Cardiology

## 2015-12-27 ENCOUNTER — Ambulatory Visit (INDEPENDENT_AMBULATORY_CARE_PROVIDER_SITE_OTHER): Payer: Medicare Other

## 2015-12-27 VITALS — BP 120/60 | HR 72 | Ht 77.0 in | Wt 205.8 lb

## 2015-12-27 DIAGNOSIS — Z9889 Other specified postprocedural states: Secondary | ICD-10-CM

## 2015-12-27 DIAGNOSIS — I4892 Unspecified atrial flutter: Secondary | ICD-10-CM | POA: Insufficient documentation

## 2015-12-27 DIAGNOSIS — I10 Essential (primary) hypertension: Secondary | ICD-10-CM | POA: Insufficient documentation

## 2015-12-27 DIAGNOSIS — I4891 Unspecified atrial fibrillation: Secondary | ICD-10-CM | POA: Diagnosis not present

## 2015-12-27 DIAGNOSIS — I480 Paroxysmal atrial fibrillation: Secondary | ICD-10-CM

## 2015-12-27 DIAGNOSIS — E7800 Pure hypercholesterolemia, unspecified: Secondary | ICD-10-CM | POA: Diagnosis not present

## 2015-12-27 DIAGNOSIS — I071 Rheumatic tricuspid insufficiency: Secondary | ICD-10-CM | POA: Diagnosis not present

## 2015-12-27 DIAGNOSIS — I351 Nonrheumatic aortic (valve) insufficiency: Secondary | ICD-10-CM | POA: Insufficient documentation

## 2015-12-27 DIAGNOSIS — Z7901 Long term (current) use of anticoagulants: Secondary | ICD-10-CM

## 2015-12-27 DIAGNOSIS — I517 Cardiomegaly: Secondary | ICD-10-CM | POA: Diagnosis not present

## 2015-12-27 DIAGNOSIS — I052 Rheumatic mitral stenosis with insufficiency: Secondary | ICD-10-CM | POA: Diagnosis not present

## 2015-12-27 DIAGNOSIS — Z5181 Encounter for therapeutic drug level monitoring: Secondary | ICD-10-CM

## 2015-12-27 LAB — POCT INR: INR: 2.7

## 2015-12-27 LAB — BASIC METABOLIC PANEL
BUN: 27 mg/dL — AB (ref 7–25)
CALCIUM: 9.7 mg/dL (ref 8.6–10.3)
CO2: 25 mmol/L (ref 20–31)
Chloride: 103 mmol/L (ref 98–110)
Creat: 1.08 mg/dL (ref 0.70–1.11)
GLUCOSE: 80 mg/dL (ref 65–99)
Potassium: 4.5 mmol/L (ref 3.5–5.3)
SODIUM: 139 mmol/L (ref 135–146)

## 2015-12-27 NOTE — Patient Instructions (Addendum)
Medication Instructions:  Your physician recommends that you continue on your current medications as directed. Please refer to the Current Medication list given to you today.  Labwork: BMET/CBC/PT/INR/PTT  Testing/Procedures: A chest x-ray takes a picture of the organs and structures inside the chest, including the heart, lungs, and blood vessels. This test can show several things, including, whether the heart is enlarges; whether fluid is building up in the lungs; and whether pacemaker / defibrillator leads are still in place. [facility] [place]   Follow-Up: 01/09/16 AT 11:45 WITH  Dr. Mare Ferrari   Any Other Special Instructions Will Be Listed Below (If Applicable).  Your provider has recommended a cardioversion.   You are scheduled for a cardioversion on 01/02/16 at 12:30 with Dr. Acie Fredrickson or associates. Please go to [facility] at 11:00.  Enter through the [place] not have any food or drink after midnight the night before.  Only take your Amiodarone and Metoprolol morning of with a sip of water on the day of your procedure.  You will need someone to drive you home following your procedure.   Call the [place] office at [phone] if you have any questions, problems or concerns.     Electrical Cardioversion Electrical cardioversion is the delivery of a jolt of electricity to change the rhythm of the heart. Sticky patches or metal paddles are placed on the chest to deliver the electricity from a device. This is done to restore a normal rhythm. A rhythm that is too fast or not regular keeps the heart from pumping well. Electrical cardioversion is done in an emergency if:   There is low or no blood pressure as a result of the heart rhythm.   Normal rhythm must be restored as fast as possible to protect the brain and heart from further damage.   It may save a life. Cardioversion may  be done for heart rhythms that are not immediately life threatening, such as atrial fibrillation or flutter, in which:   The heart is beating too fast or is not regular.   Medicine to change the rhythm has not worked.   It is safe to wait in order to allow time for preparation.  Symptoms of the abnormal rhythm are bothersome.  The risk of stroke and other serious problems can be reduced.  LET [facility] CARE PROVIDER KNOW ABOUT:   Any allergies you have.  All medicines you are taking, including vitamins, herbs, eye drops, creams, and over-the-counter medicines.  Previous problems you or members of your family have had with the use of anesthetics.   Any blood disorders you have.   Previous surgeries you have had.   Medical conditions you have.   RISKS AND COMPLICATIONS  Generally, this is a safe procedure. However, problems can occur and include:   Breathing problems related to the anesthetic used.  A blood clot that breaks free and travels to other parts of your body. This could cause a stroke or other problems. The risk of this is lowered by use of blood-thinning medicine (anticoagulant) prior to the procedure.  Cardiac arrest (rare).   BEFORE THE PROCEDURE   You may have tests to detect blood clots in your heart and to evaluate heart function.  You may start taking anticoagulants so your blood does not clot as easily.   Medicines may be given to help stabilize your heart rate and rhythm.   PROCEDURE  You will be given medicine through an IV tube to reduce discomfort and make you sleepy (sedative).   An electrical shock will be delivered.   AFTER THE PROCEDURE Your heart rhythm will be watched to make sure it does not change. You will need someone to drive you home.

## 2015-12-27 NOTE — Progress Notes (Signed)
Cardiology Office Note   Date:  12/27/2015   ID:  [name] [name], DOB Apr 02, 1931, MRN [id number]  PCP:  Penni Homans, MD  Cardiologist: Darlin Coco MD  No chief complaint on file.     History of Present Illness: [name] [name] is a 82 y.o. male who presents for Scheduled follow-up visit and for discussion of cardioversion.  . He has a past history of severe mitral valve prolapse. He underwent mitral valve repair by Dr. Roxy Manns on 10/01/11. He did not have any coronary disease. He had a prolonged hospital course but eventually recovered and is now doing well from a cardiac standpoint. In may 2015 the patient had an episode of syncope at home secondary to dehydration and a urinary tract infection. He had atrial fibrillation recurrence felt to be secondary to the stress of the UTI. He subsequently went back in normal sinus rhythm and remains on warfarin. His echocardiogram 04/03/14 showed an ejection fraction of 50% and mitral valve repair looked good. several weeks ago the patient noted some increased peripheral edema and that his heart rate was irregular. He was seen a week ago which time he was noted to be in atrial flutter with variable ventricular response. started on amiodarone. His warfarin was continued. He states that he feels about the same today as he did last week. He has noted increased peripheral edema.At his last visit his Lasix was increased.  However he did not have a beneficial effect on his bilateral peripheral edema.  His weight is up since last visit.  He has really not been bothered by his atrial flutter.  He is not aware of the arrhythmia.  His ventricular response is regular.  Past Medical History  Diagnosis Date  . Prostate cancer ([place])   . Melanoma ([place])     Left Shoulder  . Vision abnormalities     Cornea scarring  . Osteoarthritis     Knees  . Hypertension   . Pure hypercholesterolemia   . Depressive disorder, not elsewhere classified   . MVP  (mitral valve prolapse)     a. With severe MR s/p Complex valvuloplasty including artificial Gore-tex neochord placement x4, chordal transposition x1, chordal release x1, # 32 mm Sorin Memo 3D Ring Annuloplasty 2012.  Marland Kitchen Personal history of colonic polyps   . Thrombocytopenia ([place])   . Mitral regurgitation   . PVC (premature ventricular contraction)   . Neuropathy ([place])   . Adenomatous polyps   . Internal nasal lesion 05/15/2013  . PAF (paroxysmal atrial fibrillation) ([place])     a. Post-op MVR 2012.  . Pulmonary HTN ([place])     a. Mild-mod by cath 2012.  Marland Kitchen Normal coronary arteries     a. Normal coronary anatomy by cath 2012.    Past Surgical History  Procedure Laterality Date  . Nuclear stress test  09/2006    EF-64%, Normal  . US echocardiography  09/2009, 08/1011    mild LVH,mild AI,MVP with mild MR, mild-mod. TR with mild Pulm. HTN, EF-55-60%  . Inguinal hernia repair  09/2009    Left  . Knee arthroscopy       left x3  and right x2  . Colonoscopy w/ polypectomy    . Prostatectomy  1993  . Rotator cuff repair  2003    left  . Melanoma surgery      2001, 2005, 2006, 2009  . Tee without cardioversion  09/26/2011    Procedure: TRANSESOPHAGEAL ECHOCARDIOGRAM (TEE);  Surgeon: Aaron Edelman  Jacalyn Lefevre, MD;  Location: [facility] ENDOSCOPY;  Service: Cardiovascular;  Laterality: N/A;  . Mitral valve repair  10/01/2011    complex valvuloplasty with Goretex cord replacement and chordal transposition 54mm Sorin Memo 3D ring annuloplasty  . Root canal  08-19-12  . Cardiac catheterization  09/2011    Pre-op for MVR -- normal coronaries.     Current Outpatient Prescriptions  Medication Sig Dispense Refill  . acetaminophen (TYLENOL) 325 MG tablet Take 325 mg by mouth every 6 (six) hours as needed. For pain    . amiodarone (PACERONE) 200 MG tablet Take 1 tablet (200 mg total) by mouth 2 (two) times daily. 60 tablet 3  . diltiazem (CARDIZEM CD) 300 MG 24 hr capsule Take 1 capsule (300 mg total) by mouth  daily. 90 capsule 1  . docusate sodium (COLACE) 100 MG capsule Take 200 mg by mouth daily. 2  DAILY    . furosemide (LASIX) 40 MG tablet Take 1 tablet (40 mg total) by mouth 2 (two) times daily. 180 tablet 3  . Glucosamine-Chondroitin (GLUCOSAMINE CHONDR COMPLEX PO) Take 1 capsule by mouth 3 (three) times daily.     Marland Kitchen lactose free nutrition (BOOST) LIQD Take 1 Container by mouth daily.    . metoprolol succinate (TOPROL-XL) 50 MG 24 hr tablet Take 1 tablet (50 mg total) by mouth daily. Take with or immediately following a meal. 10 tablet 0  . Multiple Vitamins-Minerals (MULTIVITAMINS THER. W/MINERALS) TABS Take 1 tablet by mouth daily. 30 each   . potassium chloride SA (K-DUR,KLOR-CON) 20 MEQ tablet Take 2 tablets (40 mEq total) by mouth daily. 180 tablet 3  . RESTASIS 0.05 % ophthalmic emulsion Place 1 drop into both eyes every 12 (twelve) hours.     . rosuvastatin (CRESTOR) 5 MG tablet Take 1 tablet (5 mg total) by mouth daily. 90 tablet 2  . traMADol (ULTRAM) 50 MG tablet Take 50 mg by mouth as needed for moderate pain.     . traZODone (DESYREL) 100 MG tablet Take 300 mg by mouth at bedtime.     . vitamin C (ASCORBIC ACID) 500 MG tablet Take 500 mg by mouth 2 (two) times daily.    Marland Kitchen warfarin (COUMADIN) 5 MG tablet TAKE 1 TABLET AS DIRECTED 90 tablet 0  . [DISCONTINUED] pantoprazole (PROTONIX) 40 MG tablet Take 1 tablet (40 mg total) by mouth daily before breakfast.     No current facility-administered medications for this visit.    Allergies:   Review of patient's allergies indicates no known allergies.    Social History:  The patient  reports that he has never smoked. He has never used smokeless tobacco. He reports that he does not drink alcohol or use illicit drugs.   Family History:  The patient's family history includes Arthritis in his mother; Clotting disorder in his brother; Hypertension in his father and mother; Psychosis in his father; Stroke in his mother. There is no history of  Colon cancer or Stomach cancer.    ROS:  Please see the history of present illness.   Otherwise, review of systems are positive for none.   All other systems are reviewed and negative.    PHYSICAL EXAM: VS:  BP 120/60 mmHg  Pulse 72  Ht 6\' 5"  (1.956 m)  Wt 205 lb 12.8 oz (93.35 kg)  BMI 24.40 kg/m2 , BMI Body mass index is 24.4 kg/(m^2). GEN: Well nourished, well developed, in no acute distress HEENT: normal Neck: no JVD, carotid bruits, or masses  Cardiac: RRR; no murmurs, rubs, or gallops,There is 3+ bilateral pretibial pitting edema. Respiratory:  clear to auscultation bilaterally, normal work of breathing GI: soft, nontender, nondistended, + BS MS: no deformity or atrophy Skin: warm and dry, no rash Neuro:  Strength and sensation are intact Psych: euthymic mood, full affect   EKG:  EKG is ordered today. The ekg ordered today demonstrates Slow atrial flutter with 3-1 ventricular response.  Since last tracing of 12/12/15, no significant change.   Recent Labs: 12/12/2015: ALT 22; BUN 22; Creat 1.04; Hemoglobin 14.7; Platelets 106*; Potassium 4.5; Sodium 138; TSH 1.401    Lipid Panel    Component Value Date/Time   CHOL 112* 11/02/2015 1048   TRIG 66 11/02/2015 1048   HDL 49 11/02/2015 1048   CHOLHDL 2.3 11/02/2015 1048   VLDL 13 11/02/2015 1048   LDLCALC 50 11/02/2015 1048      Wt Readings from Last 3 Encounters:  12/27/15 205 lb 12.8 oz (93.35 kg)  12/12/15 199 lb (90.266 kg)  12/05/15 199 lb 6.4 oz (90.447 kg)        ASSESSMENT AND PLAN:  1.. History of paroxysmal atrial fibrillation, now in atrial flutter with variable ventricular response. Previous EKG in July 2016 had shown normal sinus rhythm.He has had therapeutic INRs weekly for the past 4 weeks. 2. status post mitral valve repair for mitral valve prolapse 2012 3. history of recurrent urinary tract infections. Resolved 4. hypercholesterolemia. Tolerating low dose Crestor 5. past history of malignant  melanoma followed by Dr. Wilhemina Bonito 6. history of prostate cancer followed by Dr. Risa Grill. 7. Arthritis of low back and spinal stenosis and severe arthritis of both knees, followed by Dr. Durward Fortes 8. Constipation 9. Bilateral pretibial edema.  Echocardiogram done today results pending.  Current medicines are reviewed at length with the patient today.  The patient does not have concerns regarding medicines.  The following changes have been made:  no change  Labs/ tests ordered today include:   Orders Placed This Encounter  Procedures  . DG Chest 2 View  . INR/PT  . Basic metabolic panel  . CBC with Differential/Platelet  . PTT  . EKG 12-Lead     Disposition:  He has been adequately anticoagulated for more than 4 weeks.  We will proceed with direct-current cardioversion as an outpatient on Wednesday, February 15 at 12:30 PM by Dr. Acie Fredrickson.Continue current medication.  On the morning of his cardioversion he will take only the amiodarone and metoprolol. He will see Dr. Mare Ferrari back in the office one week after cardioversion.  Following Dr. Sherryl Barters retirement, he will be followed by Dr. Irish Lack.  Berna Spare MD 12/27/2015 1:19 PM    [facility] [address], [place], [place]  [address] Phone: [phone]; Fax: [phone]

## 2015-12-28 LAB — CBC WITH DIFFERENTIAL/PLATELET
BASOS PCT: 0 % (ref 0–1)
Basophils Absolute: 0 10*3/uL (ref 0.0–0.1)
EOS PCT: 0 % (ref 0–5)
Eosinophils Absolute: 0 10*3/uL (ref 0.0–0.7)
HEMATOCRIT: 44.4 % (ref 39.0–52.0)
HEMOGLOBIN: 14.8 g/dL (ref 13.0–17.0)
LYMPHS PCT: 16 % (ref 12–46)
Lymphs Abs: 1.2 10*3/uL (ref 0.7–4.0)
MCH: 29.7 pg (ref 26.0–34.0)
MCHC: 33.3 g/dL (ref 30.0–36.0)
MCV: 89.2 fL (ref 78.0–100.0)
MONO ABS: 1.9 10*3/uL — AB (ref 0.1–1.0)
MONOS PCT: 25 % — AB (ref 3–12)
MPV: 11.5 fL (ref 8.6–12.4)
NEUTROS ABS: 4.5 10*3/uL (ref 1.7–7.7)
Neutrophils Relative %: 59 % (ref 43–77)
Platelets: 85 10*3/uL — ABNORMAL LOW (ref 150–400)
RBC: 4.98 MIL/uL (ref 4.22–5.81)
RDW: 14.3 % (ref 11.5–15.5)
WBC: 7.7 10*3/uL (ref 4.0–10.5)

## 2015-12-28 LAB — APTT: aPTT: 52 seconds — ABNORMAL HIGH (ref 24–37)

## 2015-12-28 LAB — PROTIME-INR
INR: 2.75 — ABNORMAL HIGH (ref <1.50)
Prothrombin Time: 29.5 seconds — ABNORMAL HIGH (ref 11.6–15.2)

## 2015-12-31 ENCOUNTER — Ambulatory Visit: Payer: Medicare Other | Admitting: Cardiology

## 2016-01-01 ENCOUNTER — Ambulatory Visit (INDEPENDENT_AMBULATORY_CARE_PROVIDER_SITE_OTHER): Payer: Medicare Other | Admitting: *Deleted

## 2016-01-01 DIAGNOSIS — I4891 Unspecified atrial fibrillation: Secondary | ICD-10-CM | POA: Diagnosis not present

## 2016-01-01 DIAGNOSIS — Z5181 Encounter for therapeutic drug level monitoring: Secondary | ICD-10-CM | POA: Diagnosis not present

## 2016-01-01 DIAGNOSIS — Z7901 Long term (current) use of anticoagulants: Secondary | ICD-10-CM | POA: Diagnosis not present

## 2016-01-01 DIAGNOSIS — I480 Paroxysmal atrial fibrillation: Secondary | ICD-10-CM

## 2016-01-01 LAB — POCT INR: INR: 3.4

## 2016-01-02 ENCOUNTER — Ambulatory Visit (HOSPITAL_COMMUNITY): Payer: Medicare Other | Admitting: Anesthesiology

## 2016-01-02 ENCOUNTER — Ambulatory Visit (HOSPITAL_COMMUNITY)
Admission: RE | Admit: 2016-01-02 | Discharge: 2016-01-02 | Disposition: A | Discharge status: Home / Self Care | Payer: Medicare Other | Source: Ambulatory Visit | Attending: Cardiovascular Disease | Admitting: Cardiovascular Disease

## 2016-01-02 ENCOUNTER — Encounter (HOSPITAL_COMMUNITY)
Admission: RE | Disposition: A | Discharge status: Home / Self Care | Payer: Self-pay | Source: Ambulatory Visit | Attending: Cardiovascular Disease

## 2016-01-02 ENCOUNTER — Encounter (HOSPITAL_COMMUNITY): Payer: Self-pay | Admitting: *Deleted

## 2016-01-02 DIAGNOSIS — I10 Essential (primary) hypertension: Secondary | ICD-10-CM | POA: Insufficient documentation

## 2016-01-02 DIAGNOSIS — Z7901 Long term (current) use of anticoagulants: Secondary | ICD-10-CM | POA: Insufficient documentation

## 2016-01-02 DIAGNOSIS — K5900 Constipation, unspecified: Secondary | ICD-10-CM | POA: Diagnosis not present

## 2016-01-02 DIAGNOSIS — E7800 Pure hypercholesterolemia, unspecified: Secondary | ICD-10-CM | POA: Insufficient documentation

## 2016-01-02 DIAGNOSIS — I480 Paroxysmal atrial fibrillation: Secondary | ICD-10-CM | POA: Diagnosis not present

## 2016-01-02 DIAGNOSIS — I4892 Unspecified atrial flutter: Secondary | ICD-10-CM | POA: Insufficient documentation

## 2016-01-02 DIAGNOSIS — Z85820 Personal history of malignant melanoma of skin: Secondary | ICD-10-CM | POA: Insufficient documentation

## 2016-01-02 DIAGNOSIS — Z8546 Personal history of malignant neoplasm of prostate: Secondary | ICD-10-CM | POA: Insufficient documentation

## 2016-01-02 HISTORY — PX: CARDIOVERSION: SHX1299

## 2016-01-02 SURGERY — CARDIOVERSION
Anesthesia: Monitor Anesthesia Care

## 2016-01-02 MED ORDER — LIDOCAINE HCL (CARDIAC) 20 MG/ML IV SOLN
INTRAVENOUS | Status: DC | PRN
Start: 2016-01-02 — End: 2016-01-02
  Administered 2016-01-02: 60 mg via INTRAVENOUS

## 2016-01-02 MED ORDER — PROPOFOL 10 MG/ML IV BOLUS
INTRAVENOUS | Status: DC | PRN
  Administered 2016-01-02: 30 mg via INTRAVENOUS
  Administered 2016-01-02: 50 mg via INTRAVENOUS

## 2016-01-02 NOTE — Anesthesia Postprocedure Evaluation (Signed)
Anesthesia Post Note  Patient: [name] [name]  Procedure(s) Performed: Procedure(s) (LRB): CARDIOVERSION (N/A)  Patient location during evaluation: Endoscopy Anesthesia Type: MAC Level of consciousness: awake and alert Pain management: pain level controlled Vital Signs Assessment: post-procedure vital signs reviewed and stable Respiratory status: spontaneous breathing, nonlabored ventilation, respiratory function stable and patient connected to nasal cannula oxygen Cardiovascular status: stable and blood pressure returned to baseline Anesthetic complications: no    Last Vitals:  Filed Vitals:   01/02/16 1347 01/02/16 1350  BP: 144/90 144/90  Pulse: 65 66  Temp:    Resp: 14 10    Last Pain: There were no vitals filed for this visit.               Catalina Gravel

## 2016-01-02 NOTE — Anesthesia Preprocedure Evaluation (Addendum)
Anesthesia Evaluation  Patient identified by MRN, date of birth, ID band Patient awake    Reviewed: Allergy & Precautions, NPO status , Patient's Chart, lab work & pertinent test results, reviewed documented beta blocker date and time   History of Anesthesia Complications Negative for: history of anesthetic complications  Airway Mallampati: II  TM Distance: >3 FB Neck ROM: Full    Dental  (+) Teeth Intact, Dental Advisory Given   Pulmonary neg pulmonary ROS,    Pulmonary exam normal breath sounds clear to auscultation       Cardiovascular hypertension, Pt. on medications and Pt. on home beta blockers (-) angina(-) CAD and (-) Past MI Normal cardiovascular exam+ dysrhythmias Atrial Fibrillation + Valvular Problems/Murmurs (s/p MVR) MVP  Rhythm:Regular Rate:Normal     Neuro/Psych PSYCHIATRIC DISORDERS Depression negative neurological ROS     GI/Hepatic Neg liver ROS, GERD  Medicated,  Endo/Other  negative endocrine ROS  Renal/GU negative Renal ROS     Musculoskeletal  (+) Arthritis , Osteoarthritis,    Abdominal   Peds  Hematology  (+) Blood dyscrasia (on warfarin; thrombocytopenia), ,   Anesthesia Other Findings Day of surgery medications reviewed with the patient.  Reproductive/Obstetrics                           Anesthesia Physical Anesthesia Plan  ASA: III  Anesthesia Plan: MAC   Post-op Pain Management:    Induction: Intravenous  Airway Management Planned: Nasal Cannula  Additional Equipment:   Intra-op Plan:   Post-operative Plan:   Informed Consent: I have reviewed the patients History and Physical, chart, labs and discussed the procedure including the risks, benefits and alternatives for the proposed anesthesia with the patient or authorized representative who has indicated his/her understanding and acceptance.   Dental advisory given  Plan Discussed with: CRNA and  Anesthesiologist  Anesthesia Plan Comments: (Discussed risks/benefits/alternatives to MAC sedation including need for ventilatory support, hypotension, need for conversion to general anesthesia.  All patient questions answered.  Patient wished to proceed.)        Anesthesia Quick Evaluation

## 2016-01-02 NOTE — Anesthesia Procedure Notes (Signed)
Procedure Name: MAC Date/Time: 01/02/2016 1:07 PM Performed by: Mariea Clonts Pre-anesthesia Checklist: Patient identified, Emergency Drugs available, Suction available, Patient being monitored and Timeout performed Patient Re-evaluated:Patient Re-evaluated prior to inductionOxygen Delivery Method: Nasal cannula and Ambu bag Preoxygenation: Pre-oxygenation with 100% oxygen Intubation Type: IV induction

## 2016-01-02 NOTE — Transfer of Care (Signed)
Immediate Anesthesia Transfer of Care Note  Patient: [name] [name]  Procedure(s) Performed: Procedure(s): CARDIOVERSION (N/A)  Patient Location: PACU  Anesthesia Type:MAC  Level of Consciousness: awake, alert  and oriented  Airway & Oxygen Therapy: Patient Spontanous Breathing and Patient connected to nasal cannula oxygen  Post-op Assessment: Report given to RN and Post -op Vital signs reviewed and stable  Post vital signs: Reviewed and stable  Last Vitals:  Filed Vitals:   01/02/16 1202  BP: 147/91  Pulse: 88  Temp: 36.2 C  Resp: 11    Complications: No apparent anesthesia complications

## 2016-01-02 NOTE — H&P (View-Only) (Signed)
Cardiology Office Note   Date:  12/27/2015   ID:  [name] [name], DOB Jun 01, 1931, MRN [id number]  PCP:  [name] Homans, MD  Cardiologist: [name] Coco MD  No chief complaint on file.     History of Present Illness: [name] [name] is a 82 y.o. male who presents for Scheduled follow-up visit and for discussion of cardioversion.  . He has a past history of severe mitral valve prolapse. He underwent mitral valve repair by Dr. Roxy [name] on 10/01/11. He did not have any coronary disease. He had a prolonged hospital course but eventually recovered and is now doing well from a cardiac standpoint. In may 2015 the patient had an episode of syncope at home secondary to dehydration and a urinary tract infection. He had atrial fibrillation recurrence felt to be secondary to the stress of the UTI. He subsequently went back in normal sinus rhythm and remains on warfarin. His echocardiogram 04/03/14 showed an ejection fraction of 50% and mitral valve repair looked good. several weeks ago the patient noted some increased peripheral edema and that his heart rate was irregular. He was seen a week ago which time he was noted to be in atrial flutter with variable ventricular response. started on amiodarone. His warfarin was continued. He states that he feels about the same today as he did last week. He has noted increased peripheral edema.At his last visit his Lasix was increased.  However he did not have a beneficial effect on his bilateral peripheral edema.  His weight is up since last visit.  He has really not been bothered by his atrial flutter.  He is not aware of the arrhythmia.  His ventricular response is regular.  Past Medical History  Diagnosis Date  . Prostate cancer ([place])   . Melanoma ([place])     Left Shoulder  . Vision abnormalities     Cornea scarring  . Osteoarthritis     Knees  . Hypertension   . Pure hypercholesterolemia   . Depressive disorder, not elsewhere classified   . MVP  (mitral valve prolapse)     a. With severe MR s/p Complex valvuloplasty including artificial Gore-tex neochord placement x4, chordal transposition x1, chordal release x1, # 32 mm Sorin Memo 3D Ring Annuloplasty 2012.  [name] [name] Personal history of colonic polyps   . Thrombocytopenia ([place])   . Mitral regurgitation   . PVC (premature ventricular contraction)   . Neuropathy ([place])   . Adenomatous polyps   . Internal nasal lesion 05/15/2013  . PAF (paroxysmal atrial fibrillation) ([place])     a. Post-op MVR 2012.  . Pulmonary HTN ([place])     a. Mild-mod by cath 2012.  [name] [name] Normal coronary arteries     a. Normal coronary anatomy by cath 2012.    Past Surgical History  Procedure Laterality Date  . Nuclear stress test  09/2006    EF-64%, Normal  . US echocardiography  09/2009, 08/1011    mild LVH,mild AI,MVP with mild MR, mild-mod. TR with mild Pulm. HTN, EF-55-60%  . Inguinal hernia repair  09/2009    Left  . Knee arthroscopy       left x3  and right x2  . Colonoscopy w/ polypectomy    . Prostatectomy  1993  . Rotator cuff repair  2003    left  . Melanoma surgery      2001, 2005, 2006, 2009  . Tee without cardioversion  09/26/2011    Procedure: TRANSESOPHAGEAL ECHOCARDIOGRAM (TEE);  Surgeon: [name] [name]  [name] Lefevre, MD;  Location: [facility];  Service: Cardiovascular;  Laterality: N/A;  . Mitral valve repair  10/01/2011    complex valvuloplasty with Goretex cord replacement and chordal transposition 75mm Sorin Memo 3D ring annuloplasty  . Root canal  08-19-12  . Cardiac catheterization  09/2011    Pre-op for MVR -- normal coronaries.     Current Outpatient Prescriptions  Medication Sig Dispense Refill  . acetaminophen (TYLENOL) 325 MG tablet Take 325 mg by mouth every 6 (six) hours as needed. For pain    . amiodarone (PACERONE) 200 MG tablet Take 1 tablet (200 mg total) by mouth 2 (two) times daily. 60 tablet 3  . diltiazem (CARDIZEM CD) 300 MG 24 hr capsule Take 1 capsule (300 mg total) by mouth  daily. 90 capsule 1  . docusate sodium (COLACE) 100 MG capsule Take 200 mg by mouth daily. 2  DAILY    . furosemide (LASIX) 40 MG tablet Take 1 tablet (40 mg total) by mouth 2 (two) times daily. 180 tablet 3  . Glucosamine-Chondroitin (GLUCOSAMINE CHONDR COMPLEX PO) Take 1 capsule by mouth 3 (three) times daily.     [name] [name] lactose free nutrition (BOOST) LIQD Take 1 Container by mouth daily.    . metoprolol succinate (TOPROL-XL) 50 MG 24 hr tablet Take 1 tablet (50 mg total) by mouth daily. Take with or immediately following a meal. 10 tablet 0  . Multiple Vitamins-Minerals (MULTIVITAMINS THER. W/MINERALS) TABS Take 1 tablet by mouth daily. 30 each   . potassium chloride SA (K-DUR,KLOR-CON) 20 MEQ tablet Take 2 tablets (40 mEq total) by mouth daily. 180 tablet 3  . RESTASIS 0.05 % ophthalmic emulsion Place 1 drop into both eyes every 12 (twelve) hours.     . rosuvastatin (CRESTOR) 5 MG tablet Take 1 tablet (5 mg total) by mouth daily. 90 tablet 2  . traMADol (ULTRAM) 50 MG tablet Take 50 mg by mouth as needed for moderate pain.     . traZODone (DESYREL) 100 MG tablet Take 300 mg by mouth at bedtime.     . vitamin C (ASCORBIC ACID) 500 MG tablet Take 500 mg by mouth 2 (two) times daily.    [name] [name] warfarin (COUMADIN) 5 MG tablet TAKE 1 TABLET AS DIRECTED 90 tablet 0  . [DISCONTINUED] pantoprazole (PROTONIX) 40 MG tablet Take 1 tablet (40 mg total) by mouth daily before breakfast.     No current facility-administered medications for this visit.    Allergies:   Review of patient's allergies indicates no known allergies.    Social History:  The patient  reports that he has never smoked. He has never used smokeless tobacco. He reports that he does not drink alcohol or use illicit drugs.   Family History:  The patient's family history includes Arthritis in his mother; Clotting disorder in his brother; Hypertension in his father and mother; Psychosis in his father; Stroke in his mother. There is no history of  Colon cancer or Stomach cancer.    ROS:  Please see the history of present illness.   Otherwise, review of systems are positive for none.   All other systems are reviewed and negative.    PHYSICAL EXAM: VS:  BP 120/60 mmHg  Pulse 72  Ht 6\' 5"  (1.956 m)  Wt 205 lb 12.8 oz (93.35 kg)  BMI 24.40 kg/m2 , BMI Body mass index is 24.4 kg/(m^2). GEN: Well nourished, well developed, in no acute distress HEENT: normal Neck: no JVD, carotid bruits, or masses  Cardiac: RRR; no murmurs, rubs, or gallops,There is 3+ bilateral pretibial pitting edema. Respiratory:  clear to auscultation bilaterally, normal work of breathing GI: soft, nontender, nondistended, + BS MS: no deformity or atrophy Skin: warm and dry, no rash Neuro:  Strength and sensation are intact Psych: euthymic mood, full affect   EKG:  EKG is ordered today. The ekg ordered today demonstrates Slow atrial flutter with 3-1 ventricular response.  Since last tracing of 12/12/15, no significant change.   Recent Labs: 12/12/2015: ALT 22; BUN 22; Creat 1.04; Hemoglobin 14.7; Platelets 106*; Potassium 4.5; Sodium 138; TSH 1.401    Lipid Panel    Component Value Date/Time   CHOL 112* 11/02/2015 1048   TRIG 66 11/02/2015 1048   HDL 49 11/02/2015 1048   CHOLHDL 2.3 11/02/2015 1048   VLDL 13 11/02/2015 1048   LDLCALC 50 11/02/2015 1048      Wt Readings from Last 3 Encounters:  12/27/15 205 lb 12.8 oz (93.35 kg)  12/12/15 199 lb (90.266 kg)  12/05/15 199 lb 6.4 oz (90.447 kg)        ASSESSMENT AND PLAN:  1.. History of paroxysmal atrial fibrillation, now in atrial flutter with variable ventricular response. Previous EKG in July 2016 had shown normal sinus rhythm.He has had therapeutic INRs weekly for the past 4 weeks. 2. status post mitral valve repair for mitral valve prolapse 2012 3. history of recurrent urinary tract infections. Resolved 4. hypercholesterolemia. Tolerating low dose Crestor 5. past history of malignant  melanoma followed by Dr. Wilhemina Bonito 6. history of prostate cancer followed by Dr. Risa Grill. 7. Arthritis of low back and spinal stenosis and severe arthritis of both knees, followed by Dr. Durward Fortes 8. Constipation 9. Bilateral pretibial edema.  Echocardiogram done today results pending.  Current medicines are reviewed at length with the patient today.  The patient does not have concerns regarding medicines.  The following changes have been made:  no change  Labs/ tests ordered today include:   Orders Placed This Encounter  Procedures  . DG Chest 2 View  . INR/PT  . Basic metabolic panel  . CBC with Differential/Platelet  . PTT  . EKG 12-Lead     Disposition:  He has been adequately anticoagulated for more than 4 weeks.  We will proceed with direct-current cardioversion as an outpatient on Wednesday, February 15 at 12:30 PM by Dr. Acie Fredrickson.Continue current medication.  On the morning of his cardioversion he will take only the amiodarone and metoprolol. He will see Dr. Mare Ferrari back in the office one week after cardioversion.  Following Dr. Sherryl Barters retirement, he will be followed by Dr. Irish Lack.  Berna Spare MD 12/27/2015 1:19 PM    [facility] [address], [place], [place]  [address] Phone: [phone]; Fax: [phone]

## 2016-01-02 NOTE — Interval H&P Note (Signed)
History and Physical Interval Note:  01/02/2016 1:18 PM  [name] [name]  has presented today for surgery, with the diagnosis of afib  The various methods of treatment have been discussed with the patient and family. After consideration of risks, benefits and other options for treatment, the patient has consented to  Procedure(s): CARDIOVERSION (N/A) as a surgical intervention .  The patient's history has been reviewed, patient examined, no change in status, stable for surgery.  I have reviewed the patient's chart and labs.  Questions were answered to the patient's satisfaction.     Hanae Waiters, Wonda Cheng

## 2016-01-02 NOTE — CV Procedure (Signed)
    Cardioversion Note  SHARI ZEEDYK [id number] Feb 27, 1931  Procedure: DC Cardioversion Indications: atrial flutter   Procedure Details Consent: Obtained Time Out: Verified patient identification, verified procedure, site/side was marked, verified correct patient position, special equipment/implants available, Radiology Safety Procedures followed,  medications/allergies/relevent history reviewed, required imaging and test results available.  Performed  The patient has been on adequate anticoagulation.  The patient received IV Lidocaine 60 mg followed by Propofol 80 mg  for sedation.  Synchronous cardioversion was performed at 50  joules.  The cardioversion was successful     Complications: No apparent complications Patient did tolerate procedure well.   Thayer Headings, Brooke Bonito., MD, [facility] 01/02/2016, 1:18 PM

## 2016-01-02 NOTE — Discharge Instructions (Signed)
Electrical Cardioversion, Care After °Refer to this sheet in the next few weeks. These instructions provide you with information on caring for yourself after your procedure. Your health care provider may also give you more specific instructions. Your treatment has been planned according to current medical practices, but problems sometimes occur. Call your health care provider if you have any problems or questions after your procedure. °WHAT TO EXPECT AFTER THE PROCEDURE °After your procedure, it is typical to have the following sensations: °· Some redness on the skin where the shocks were delivered. If this is tender, a sunburn lotion or hydrocortisone cream may help. °· Possible return of an abnormal heart rhythm within hours or days after the procedure. °HOME CARE INSTRUCTIONS °· Take medicines only as directed by your health care provider. Be sure you understand how and when to take your medicine. °· Learn how to feel your pulse and check it often. °· Limit your activity for 48 hours after the procedure or as directed by your health care provider. °· Avoid or minimize caffeine and other stimulants as directed by your health care provider. °SEEK MEDICAL CARE IF: °· You feel like your heart is beating too fast or your pulse is not regular. °· You have any questions about your medicines. °· You have bleeding that will not stop. °SEEK IMMEDIATE MEDICAL CARE IF: °· You are dizzy or feel faint. °· It is hard to breathe or you feel short of breath. °· There is a change in discomfort in your chest. °· Your speech is slurred or you have trouble moving an arm or leg on one side of your body. °· You get a serious muscle cramp that does not go away. °· Your fingers or toes turn cold or blue. °  °This information is not intended to replace advice given to you by your health care provider. Make sure you discuss any questions you have with your health care provider. °  °Document Released: 08/24/2013 Document Revised: 11/24/2014  Document Reviewed: 08/24/2013 °Elsevier Interactive Patient Education ©2016 [employer] ° °

## 2016-01-03 ENCOUNTER — Encounter (HOSPITAL_COMMUNITY): Payer: Self-pay | Admitting: Cardiovascular Disease

## 2016-01-09 ENCOUNTER — Ambulatory Visit (INDEPENDENT_AMBULATORY_CARE_PROVIDER_SITE_OTHER): Payer: Medicare Other | Admitting: *Deleted

## 2016-01-09 ENCOUNTER — Ambulatory Visit (INDEPENDENT_AMBULATORY_CARE_PROVIDER_SITE_OTHER): Payer: Medicare Other | Admitting: Cardiology

## 2016-01-09 ENCOUNTER — Encounter: Payer: Self-pay | Admitting: Cardiology

## 2016-01-09 VITALS — BP 138/70 | HR 84 | Ht 77.0 in | Wt 202.8 lb

## 2016-01-09 DIAGNOSIS — Z9889 Other specified postprocedural states: Secondary | ICD-10-CM | POA: Diagnosis not present

## 2016-01-09 DIAGNOSIS — Z7901 Long term (current) use of anticoagulants: Secondary | ICD-10-CM

## 2016-01-09 DIAGNOSIS — I4892 Unspecified atrial flutter: Secondary | ICD-10-CM | POA: Diagnosis not present

## 2016-01-09 DIAGNOSIS — Z5181 Encounter for therapeutic drug level monitoring: Secondary | ICD-10-CM

## 2016-01-09 DIAGNOSIS — I4891 Unspecified atrial fibrillation: Secondary | ICD-10-CM

## 2016-01-09 DIAGNOSIS — R609 Edema, unspecified: Secondary | ICD-10-CM | POA: Diagnosis not present

## 2016-01-09 DIAGNOSIS — R938 Abnormal findings on diagnostic imaging of other specified body structures: Secondary | ICD-10-CM

## 2016-01-09 DIAGNOSIS — I480 Paroxysmal atrial fibrillation: Secondary | ICD-10-CM

## 2016-01-09 DIAGNOSIS — R9389 Abnormal findings on diagnostic imaging of other specified body structures: Secondary | ICD-10-CM

## 2016-01-09 DIAGNOSIS — J189 Pneumonia, unspecified organism: Secondary | ICD-10-CM | POA: Insufficient documentation

## 2016-01-09 LAB — POCT INR: INR: 4.3

## 2016-01-09 NOTE — Progress Notes (Signed)
Cardiology Office Note   Date:  01/09/2016   ID:  [name] [name], DOB Nov 08, 1931, MRN [id number]  PCP:  Penni Homans, MD  Cardiologist: Darlin Coco MD  Chief Complaint  Patient presents with  . scheduled follow up    Atrial Fibrillation. Post DCCV EKG/ follow up  . Shortness of Breath      History of Present Illness: [name] [name] is a 82 y.o. male who presents for post- cardioversion office visit.  Marland Kitchen He has a past history of severe mitral valve prolapse. He underwent mitral valve repair by Dr. Roxy Manns on 10/01/11. He did not have any coronary disease. He had a prolonged hospital course but eventually recovered and is now doing well from a cardiac standpoint. In may 2015 the patient had an episode of syncope at home secondary to dehydration and a urinary tract infection. He had atrial fibrillation recurrence felt to be secondary to the stress of the UTI. He subsequently went back in normal sinus rhythm and remains on warfarin. His echocardiogram 04/03/14 showed an ejection fraction of 50% and mitral valve repair looked good. The patient was seen on 12/05/15 and at that time he was noted to be in atrial flutter with variable ventricular response.  He was started on amiodarone. His warfarin was continued.He did not convert on amiodarone.  Therefore outpatient cardioversion was arranged.  . On 01/02/16 the patient underwent synchronized cardioversion using 50 J by Dr. Acie Fredrickson with successful cardioversion to normal sinus rhythm from atrial flutter. Since his cardioversion he has felt better from a cardiac standpoint.  He has had more energy.  His peripheral edema has improved only slightly.  His weight is down 3 pounds since last visit.  He has had a chest cold.  Today he coughed up some bloody sputum.  His INR today is supratherapeutic at 4.3.  He had a recent chest x-ray on February 9 and there was a decreased aeration at the base and a follow-up chest 2 x-ray was recommended to  assure clearing.  We will plan to do that in mid March.  For his cough he will add Mucinex over-the-counter  Past Medical History  Diagnosis Date  . Prostate cancer ([place])   . Melanoma ([place])     Left Shoulder  . Vision abnormalities     Cornea scarring  . Osteoarthritis     Knees  . Hypertension   . Pure hypercholesterolemia   . Depressive disorder, not elsewhere classified   . MVP (mitral valve prolapse)     a. With severe MR s/p Complex valvuloplasty including artificial Gore-tex neochord placement x4, chordal transposition x1, chordal release x1, # 32 mm Sorin Memo 3D Ring Annuloplasty 2012.  Marland Kitchen Personal history of colonic polyps   . Thrombocytopenia ([place])   . Mitral regurgitation   . PVC (premature ventricular contraction)   . Neuropathy ([place])   . Adenomatous polyps   . Internal nasal lesion 05/15/2013  . PAF (paroxysmal atrial fibrillation) ([place])     a. Post-op MVR 2012.  . Pulmonary HTN ([place])     a. Mild-mod by cath 2012.  Marland Kitchen Normal coronary arteries     a. Normal coronary anatomy by cath 2012.    Past Surgical History  Procedure Laterality Date  . Nuclear stress test  09/2006    EF-64%, Normal  . US echocardiography  09/2009, 08/1011    mild LVH,mild AI,MVP with mild MR, mild-mod. TR with mild Pulm. HTN, EF-55-60%  . Inguinal hernia repair  09/2009    Left  . Knee arthroscopy       left x3  and right x2  . Colonoscopy w/ polypectomy    . Prostatectomy  1993  . Rotator cuff repair  2003    left  . Melanoma surgery      2001, 2005, 2006, 2009  . Tee without cardioversion  09/26/2011    Procedure: TRANSESOPHAGEAL ECHOCARDIOGRAM (TEE);  Surgeon: Lelon Perla, MD;  Location: [facility] ENDOSCOPY;  Service: Cardiovascular;  Laterality: N/A;  . Mitral valve repair  10/01/2011    complex valvuloplasty with Goretex cord replacement and chordal transposition 35mm Sorin Memo 3D ring annuloplasty  . Root canal  08-19-12  . Cardiac catheterization  09/2011    Pre-op for MVR --  normal coronaries.  . Cardioversion N/A 01/02/2016    Procedure: CARDIOVERSION;  Surgeon: Thayer Headings, MD;  Location: [facility] ENDOSCOPY;  Service: Cardiovascular;  Laterality: N/A;     Current Outpatient Prescriptions  Medication Sig Dispense Refill  . acetaminophen (TYLENOL) 325 MG tablet Take 325 mg by mouth 2 (two) times daily. For pain    . amiodarone (PACERONE) 200 MG tablet Take 1 tablet (200 mg total) by mouth 2 (two) times daily. 60 tablet 3  . diltiazem (CARDIZEM CD) 300 MG 24 hr capsule Take 1 capsule (300 mg total) by mouth daily. 90 capsule 1  . docusate sodium (COLACE) 100 MG capsule Take 200 mg by mouth daily. 2  DAILY    . furosemide (LASIX) 40 MG tablet Take 1 tablet (40 mg total) by mouth 2 (two) times daily. 180 tablet 3  . Glucosamine-Chondroitin (GLUCOSAMINE CHONDR COMPLEX PO) Take 1 capsule by mouth 2 (two) times daily.     Marland Kitchen guaiFENesin (MUCINEX) 600 MG 12 hr tablet Take 600 mg by mouth 2 (two) times daily as needed for cough or to loosen phlegm.    . lactose free nutrition (BOOST) LIQD Take 1 Container by mouth daily.    . metoprolol succinate (TOPROL-XL) 50 MG 24 hr tablet Take 1 tablet (50 mg total) by mouth daily. Take with or immediately following a meal. 10 tablet 0  . Multiple Vitamins-Minerals (MULTIVITAMINS THER. W/MINERALS) TABS Take 1 tablet by mouth daily. 30 each   . potassium chloride SA (K-DUR,KLOR-CON) 20 MEQ tablet Take 2 tablets (40 mEq total) by mouth daily. 180 tablet 3  . RESTASIS 0.05 % ophthalmic emulsion Place 1 drop into both eyes every 12 (twelve) hours.     . rosuvastatin (CRESTOR) 5 MG tablet Take 1 tablet (5 mg total) by mouth daily. 90 tablet 2  . traMADol (ULTRAM) 50 MG tablet Take 50 mg by mouth as needed for moderate pain.     . traZODone (DESYREL) 100 MG tablet Take 300 mg by mouth at bedtime.     . vitamin C (ASCORBIC ACID) 500 MG tablet Take 500 mg by mouth 2 (two) times daily.    Marland Kitchen warfarin (COUMADIN) 5 MG tablet Take by mouth daily.  Take by mouth daily as directed by the coumadin clinic    . [DISCONTINUED] pantoprazole (PROTONIX) 40 MG tablet Take 1 tablet (40 mg total) by mouth daily before breakfast.     No current facility-administered medications for this visit.    Allergies:   Review of patient's allergies indicates no known allergies.    Social History:  The patient  reports that he has never smoked. He has never used smokeless tobacco. He reports that he does not drink alcohol or  use illicit drugs.   Family History:  The patient's family history includes Arthritis in his mother; Clotting disorder in his brother; Hypertension in his father and mother; Psychosis in his father; Stroke in his mother. There is no history of Colon cancer or Stomach cancer.    ROS:  Please see the history of present illness.   Otherwise, review of systems are positive for none.   All other systems are reviewed and negative.    PHYSICAL EXAM: VS:  BP 138/70 mmHg  Pulse 84  Ht 6\' 5"  (1.956 m)  Wt 202 lb 12.8 oz (91.989 kg)  BMI 24.04 kg/m2 , BMI Body mass index is 24.04 kg/(m^2). GEN: Well nourished, well developed, in no acute distress HEENT: normal Neck: no JVD, carotid bruits, or masses Cardiac: RRR; no murmurs, rubs, or gallops, there is 2+ bilateral pretibial and pedal edema. Respiratory: There are inspiratoryRhonchi.  The patient is not dyspneic at rest. GI: soft, nontender, nondistended, + BS MS: no deformity or atrophy Skin: warm and dry, no rash Neuro:  Strength and sensation are intact Psych: euthymic mood, full affect   EKG:  EKG is ordered today. The ekg ordered today demonstrates normal sinus rhythm with first-degree AV block.  Since previous EKG of 12/27/15, slow atrial flutter has been replaced by normal sinus rhythm.   Recent Labs: 12/12/2015: ALT 22; TSH 1.401 12/27/2015: BUN 27*; Creat 1.08; Hemoglobin 14.8; Platelets 85*; Potassium 4.5; Sodium 139    Lipid Panel    Component Value Date/Time   CHOL 112*  11/02/2015 1048   TRIG 66 11/02/2015 1048   HDL 49 11/02/2015 1048   CHOLHDL 2.3 11/02/2015 1048   VLDL 13 11/02/2015 1048   LDLCALC 50 11/02/2015 1048      Wt Readings from Last 3 Encounters:  01/09/16 202 lb 12.8 oz (91.989 kg)  12/27/15 205 lb 12.8 oz (93.35 kg)  12/12/15 199 lb (90.266 kg)         ASSESSMENT AND PLAN:  1. . History of paroxysmal atrial fibrillation, and paroxysmal atrial flutter, status post successful electrical cardioversion on 01/02/16 back to normal sinus rhythm. 2. status post mitral valve repair for mitral valve prolapse 2012 3. history of recurrent urinary tract infections. Resolved 4. hypercholesterolemia. Tolerating low dose Crestor 5. past history of malignant melanoma followed by Dr. Wilhemina Bonito 6. history of prostate cancer followed by Dr. Risa Grill. 7. Arthritis of low back and spinal stenosis and severe arthritis of both knees, followed by Dr. Durward Fortes 8. Constipation 9. Bilateral pretibial edema. Echocardiogram done on 12/27/15.  Mild left ventricular systolic dysfunction with ejection fraction 45-50%.  There was mild mitral valve and mild mitral regurgitation.  There was biatrial enlargement   Current medicines are reviewed at length with the patient today.  The patient does not have concerns regarding medicines.  The following changes have been made:  no change  Labs/ tests ordered today include:   Orders Placed This Encounter  Procedures  . DG Chest 2 View  . EKG 12-Lead     Disposition:  The patient will continue same cardiac medications.  Continue same Lasix 40 mg twice a day.  He will obtain some support hose and put them on first thing in the morning and taken off at bedtime.  He will add Mucinex 600 mg plain twice a day as needed for cough.  He will get a follow-up chest x-ray in mid .  We will have him follow-up with Dr. Irish Lack in [facility] March.  If  he continues to hold normal sinus rhythm, consider reduction of amiodarone at his  next visit.  Berna Spare MD 01/09/2016 12:46 PM    [address], [place], [place]  [address] Phone: [phone]; Fax: [phone]

## 2016-01-09 NOTE — Patient Instructions (Addendum)
Medication Instructions:  START MUCINEX PLAIN 600 MG TWICE A DAY AS NEEDED FOR COUGH  Labwork: NONE  Testing/Procedures: A chest x-ray takes a picture of the organs and structures inside the chest, including the heart, lungs, and blood vessels. This test can show several things, including, whether the heart is enlarges; whether fluid is building up in the lungs; and whether pacemaker / defibrillator leads are still in place. MID MARCH  Follow-Up: Your physician recommends that you schedule a follow-up appointment in: MID MARCH AFTER CHEST XRAY WITH DR Irish Lack OR PA/NP ON A DAY HE IS IN THE OFFICE   Try support hose   If you need a refill on your cardiac medications before your next appointment, please call your pharmacy.

## 2016-01-15 ENCOUNTER — Telehealth: Payer: Self-pay | Admitting: Family Medicine

## 2016-01-15 NOTE — Telephone Encounter (Signed)
Documented in Health Maintenance and immunizations.

## 2016-01-15 NOTE — Telephone Encounter (Signed)
Pt had flu shot mid Oct at [facility] in [place]

## 2016-01-18 ENCOUNTER — Ambulatory Visit (INDEPENDENT_AMBULATORY_CARE_PROVIDER_SITE_OTHER): Payer: Medicare Other | Admitting: *Deleted

## 2016-01-18 DIAGNOSIS — I480 Paroxysmal atrial fibrillation: Secondary | ICD-10-CM | POA: Diagnosis not present

## 2016-01-18 DIAGNOSIS — Z7901 Long term (current) use of anticoagulants: Secondary | ICD-10-CM | POA: Diagnosis not present

## 2016-01-18 DIAGNOSIS — Z5181 Encounter for therapeutic drug level monitoring: Secondary | ICD-10-CM | POA: Diagnosis not present

## 2016-01-18 DIAGNOSIS — I4891 Unspecified atrial fibrillation: Secondary | ICD-10-CM

## 2016-01-18 LAB — POCT INR: INR: 2.2

## 2016-02-01 ENCOUNTER — Ambulatory Visit (INDEPENDENT_AMBULATORY_CARE_PROVIDER_SITE_OTHER): Payer: Medicare Other | Admitting: *Deleted

## 2016-02-01 DIAGNOSIS — I4891 Unspecified atrial fibrillation: Secondary | ICD-10-CM | POA: Diagnosis not present

## 2016-02-01 DIAGNOSIS — Z5181 Encounter for therapeutic drug level monitoring: Secondary | ICD-10-CM | POA: Diagnosis not present

## 2016-02-01 DIAGNOSIS — Z7901 Long term (current) use of anticoagulants: Secondary | ICD-10-CM

## 2016-02-01 DIAGNOSIS — I480 Paroxysmal atrial fibrillation: Secondary | ICD-10-CM | POA: Diagnosis not present

## 2016-02-01 LAB — POCT INR: INR: 2.3

## 2016-02-07 ENCOUNTER — Telehealth: Payer: Self-pay | Admitting: Cardiology

## 2016-02-07 ENCOUNTER — Ambulatory Visit
Admission: RE | Admit: 2016-02-07 | Discharge: 2016-02-07 | Disposition: A | Discharge status: Home / Self Care | Payer: Medicare Other | Source: Ambulatory Visit | Attending: Cardiology | Admitting: Cardiology

## 2016-02-07 DIAGNOSIS — R9389 Abnormal findings on diagnostic imaging of other specified body structures: Secondary | ICD-10-CM

## 2016-02-07 NOTE — Telephone Encounter (Signed)
Patient had xray done, that was ordered by Dr. Mare Ferrari. Will send results to Ellen Henri PA. Patient is scheduled to see her on Monday.

## 2016-02-07 NOTE — Telephone Encounter (Signed)
Marzetta Board is calling in to report some results to the pt's chest x-ray from today. Please f/u with her

## 2016-02-12 ENCOUNTER — Ambulatory Visit (INDEPENDENT_AMBULATORY_CARE_PROVIDER_SITE_OTHER): Payer: Medicare Other | Admitting: *Deleted

## 2016-02-12 ENCOUNTER — Ambulatory Visit (INDEPENDENT_AMBULATORY_CARE_PROVIDER_SITE_OTHER): Payer: Medicare Other | Admitting: Cardiology

## 2016-02-12 ENCOUNTER — Encounter: Payer: Self-pay | Admitting: Cardiology

## 2016-02-12 VITALS — BP 116/80 | HR 80 | Ht 77.0 in | Wt 187.8 lb

## 2016-02-12 DIAGNOSIS — Z5181 Encounter for therapeutic drug level monitoring: Secondary | ICD-10-CM

## 2016-02-12 DIAGNOSIS — I4891 Unspecified atrial fibrillation: Secondary | ICD-10-CM

## 2016-02-12 DIAGNOSIS — Z9889 Other specified postprocedural states: Secondary | ICD-10-CM | POA: Diagnosis not present

## 2016-02-12 DIAGNOSIS — I480 Paroxysmal atrial fibrillation: Secondary | ICD-10-CM

## 2016-02-12 DIAGNOSIS — R9389 Abnormal findings on diagnostic imaging of other specified body structures: Secondary | ICD-10-CM

## 2016-02-12 DIAGNOSIS — R938 Abnormal findings on diagnostic imaging of other specified body structures: Secondary | ICD-10-CM

## 2016-02-12 DIAGNOSIS — I10 Essential (primary) hypertension: Secondary | ICD-10-CM | POA: Diagnosis not present

## 2016-02-12 DIAGNOSIS — Z7901 Long term (current) use of anticoagulants: Secondary | ICD-10-CM

## 2016-02-12 LAB — POCT INR: INR: 3.5

## 2016-02-12 NOTE — Patient Instructions (Signed)
Medication Instructions:  Your physician recommends that you continue on your current medications as directed. Please refer to the Current Medication list given to you today.   Labwork: None ordered  Testing/Procedures: Non-Cardiac CT scanning, (CAT scanning), is a noninvasive, special x-ray that produces cross-sectional images of the body using x-rays and a computer. CT scans help physicians diagnose and treat medical conditions. For some CT exams, a contrast material is used to enhance visibility in the area of the body being studied. CT scans provide greater clarity and reveal more details than regular x-ray exams.   Follow-Up: Your physician recommends that you schedule a follow-up appointment in: 2 MONTHS WITH DR. VARANASI   Any Other Special Instructions Will Be Listed Below (If Applicable).  CT Scan A computed tomography (CT) scan is a specialized X-ray scan. It uses X-rays and a computer to make pictures of different areas of your body. A CT scan can offer more detailed information than a regular X-ray exam. The CT scan provides data about internal organs, soft tissue structures, blood vessels, and bones.  The CT scanner is a large machine that takes pictures of your body as you move through the opening.  LET [facility] CARE PROVIDER KNOW ABOUT:  Any allergies you have.   All medicines you are taking, including vitamins, herbs, eye drops, creams, and over-the-counter medicines.   Previous problems you or members of your family have had with the use of anesthetics.   Any blood disorders you have.   Previous surgeries you have had.   Medical conditions you have. RISKS AND COMPLICATIONS  Generally, this is a safe procedure. However, as with any procedure, problems can occur. Possible problems include:   An allergic reaction to the contrast material.   Development of cancer from excessive exposure to radiation. The risk of this is small.  BEFORE THE PROCEDURE    The day before the test, stop drinking caffeinated beverages. These include energy drinks, tea, soda, coffee, and hot chocolate.   On the day of the test:  About 4 hours before the test, stop eating and drinking anything but water as advised by your health care provider.   Avoid wearing jewelry. You will have to partly or fully undress and wear a hospital gown. PROCEDURE   You will be asked to lie on a table with your arms above your head.   If contrast dye is to be used for the test, an IV tube will be inserted in your arm. The contrast dye will be injected into the IV tube. You might feel warm, or you may get a metallic taste in your mouth.   The table you will be lying on will move into a large machine that will do the scanning.   You will be able to see, hear, and talk to the person running the machine while you are in it. Follow that person's directions.   The CT machine will move around you to take pictures. Do not move while it is scanning. This helps to get a good image.   When the best possible pictures have been taken, the machine will be turned off. The table will be moved out of the machine. The IV tube will then be removed. AFTER THE PROCEDURE  Ask your health care provider when to follow up for your test results.   This information is not intended to replace advice given to you by your health care provider. Make sure you discuss any questions you have with your  health care provider.   Document Released: 12/11/2004 Document Revised: 11/08/2013 Document Reviewed: 07/11/2013 Elsevier Interactive Patient Education [employer].    If you need a refill on your cardiac medications before your next appointment, please call your pharmacy.

## 2016-02-12 NOTE — Progress Notes (Signed)
02/12/2016 [name] [name]   1931-01-19  [id number]  Primary Physician Penni Homans, MD Primary Cardiologist: Dr. Mare Ferrari   Reason for Visit/CC: F/u for Atrial Fibrillation/Flutter  HPI:  82 y/o male, followed in the past by Dr. Mare Ferrari. His past cardiac history is notable for PAF/Flutter. He is on chronic anticoagulation with Coumadin. He also has a h/o severe mitral valve prolapse. He underwent mitral valve repair by Dr. Roxy Manns on 10/01/11. He did not have any coronary disease. In May 2015 the patient had an episode of syncope at home secondary to dehydration and a urinary tract infection. He had atrial fibrillation recurrence felt to be secondary to the stress of the UTI. He subsequently went back in normal sinus rhythm and was continued on warfarin.   The patient was seen on 12/05/15 and at that time he was noted to be back in atrial flutter with variable ventricular response. 2D echo was obtained showingan EF of 45-50% with G2DD. There was grade 2 diastolic dysfunction. Mitral valve shows satisfactory function with mild stenosis and mild regurgitation.The left atrium was mildly dilated. The right atrium was severely dilated.  He was started on amiodarone. His warfarin was continued.He did not convert on amiodarone. Therefore outpatient cardioversion was arranged.On 01/02/16, the patient underwent synchronized cardioversion using 50 J by Dr. Acie Fredrickson with successful cardioversion to normal sinus rhythm from atrial flutter.  Also of note, he had a recent CXR in 12/2015 which showed decreased aeration at the base and a follow-up chest 2 x-ray was recommended to assure clearing. Dr. Mare Ferrari arranged for this to be done. F/u CXR was done 02/07/16. Radiologist reports is as follows:  IMPRESSION: 1. Stable opacity at the left lung base overlying the left heart border, with additional stable blunting of the adjacent left costophrenic angle. As described on the earlier chest x-ray  report, this could represent infiltrate and adjacent pleural effusion, but underlying mass not excluded. Another consideration would be chronic pleural thickening/scarring. Given the persistence since chest x-ray of 12/27/2015, would consider chest CT at this point to exclude the possibility of neoplastic mass. 2. Hyperexpanded lungs suggesting COPD.  His EKG today shows NSR. HR 79 bpm. He denies any symptoms of breakthrough atrial fibrillation since his cardioversion. No palpitations, dyspnea or chest pain. He also denies wheezing and productive cough. He denies any prior h/o tobacco abuse. No previous occupational exposures.  He has been fully compliant with Coumadin. No significant abnormal bleeding, other than bruising. His INR is followed in our Coumadin Clinic. He does note that he is being treated with antibiotics for UTI.      Current Outpatient Prescriptions  Medication Sig Dispense Refill  . acetaminophen (TYLENOL) 325 MG tablet Take 325 mg by mouth 2 (two) times daily. For pain    . amiodarone (PACERONE) 200 MG tablet Take 1 tablet (200 mg total) by mouth 2 (two) times daily. 60 tablet 3  . diltiazem (CARDIZEM CD) 300 MG 24 hr capsule Take 1 capsule (300 mg total) by mouth daily. 90 capsule 1  . docusate sodium (COLACE) 100 MG capsule Take 200 mg by mouth daily. 2  DAILY    . furosemide (LASIX) 40 MG tablet Take 1 tablet (40 mg total) by mouth 2 (two) times daily. 180 tablet 3  . Glucosamine-Chondroitin (GLUCOSAMINE CHONDR COMPLEX PO) Take 1 capsule by mouth 2 (two) times daily.     Marland Kitchen lactose free nutrition (BOOST) LIQD Take 1 Container by mouth daily.    . metoprolol succinate (TOPROL-XL)  50 MG 24 hr tablet Take 1 tablet (50 mg total) by mouth daily. Take with or immediately following a meal. 10 tablet 0  . Multiple Vitamins-Minerals (MULTIVITAMINS THER. W/MINERALS) TABS Take 1 tablet by mouth daily. 30 each   . potassium chloride SA (K-DUR,KLOR-CON) 20 MEQ tablet Take 2 tablets  (40 mEq total) by mouth daily. 180 tablet 3  . RESTASIS 0.05 % ophthalmic emulsion Place 1 drop into both eyes every 12 (twelve) hours.     . rosuvastatin (CRESTOR) 5 MG tablet Take 1 tablet (5 mg total) by mouth daily. 90 tablet 2  . sulfamethoxazole-trimethoprim (BACTRIM,SEPTRA) 400-80 MG tablet TK 1 T PO BID  0  . traMADol (ULTRAM) 50 MG tablet Take 50 mg by mouth as needed for moderate pain.     . traZODone (DESYREL) 100 MG tablet Take 300 mg by mouth at bedtime.     . vitamin C (ASCORBIC ACID) 500 MG tablet Take 500 mg by mouth 2 (two) times daily.    Marland Kitchen warfarin (COUMADIN) 5 MG tablet Take by mouth daily. Take by mouth daily as directed by the coumadin clinic    . [DISCONTINUED] pantoprazole (PROTONIX) 40 MG tablet Take 1 tablet (40 mg total) by mouth daily before breakfast.     No current facility-administered medications for this visit.    No Known Allergies  Social History   Social History  . Marital Status: Widowed    Spouse Name: N/A  . Number of Children: N/A  . Years of Education: N/A   Occupational History  . Not on file.   Social History Main Topics  . Smoking status: Never Smoker   . Smokeless tobacco: Never Used  . Alcohol Use: No  . Drug Use: No  . Sexual Activity: Not on file   Other Topics Concern  . Not on file   Social History Narrative   Retired - Optometrist   Widower   2 children     Review of Systems: General: negative for chills, fever, night sweats or weight changes.  Cardiovascular: negative for chest pain, dyspnea on exertion, edema, orthopnea, palpitations, paroxysmal nocturnal dyspnea or shortness of breath Dermatological: negative for rash Respiratory: negative for cough or wheezing Urologic: negative for hematuria Abdominal: negative for nausea, vomiting, diarrhea, bright red blood per rectum, melena, or hematemesis Neurologic: negative for visual changes, syncope, or dizziness All other systems reviewed and are otherwise negative  except as noted above.    Height 6\' 5"  (1.956 m).  General appearance: alert, cooperative and no distress Neck: no carotid bruit and no JVD Lungs: clear to auscultation bilaterally Heart: regular rate and rhythm, S1, S2 normal, no murmur, click, rub or gallop Extremities: 1+ bilateral LEE Pulses: 2+ and symmetric Skin: warm and dry Neurologic: Grossly normal  EKG NSR. 79 bpm   ASSESSMENT AND PLAN:   1. PAF: will continue current dose of Amiodarone now, given patient reports he is undergoing treatment for UTI. He has a h/o reverting into afib with UTIs. Will keep current dose to help maintin NSR. Dr. Irish Lack may choose to reduce dose at time of next f/u if still in SR. Countinue Coumadin for a/c. INRs are followed in our office. This will be checked today and dosed by pharmacy.  2. Chronic Combined Systolic + Diastolic HF: volume fairly stable with the exception of mild 1+ bilateral LEE. Per patient, his weight has remained stable at home. No weight gain or dyspnea. Continue current dose of lasix. Low sodium diet. Continue  daily weights.   3. Abnormal CXR: Will order a chest CT, as recommended by radiology, to r/o possibility of malignancy. Will refer to pulmonary medicine if any significant findings.   4. UTI: currently on antibiotics per patient report. Pharmacy to check INR today and will adjust  Coumadin dose if needed.   PLAN  Chest CT ordered to exclude lung malignancy. F/u with Dr. Irish Lack in 2 months   Rosita Fire [facility] PA-C 02/12/2016 9:45 AM

## 2016-02-13 ENCOUNTER — Other Ambulatory Visit (INDEPENDENT_AMBULATORY_CARE_PROVIDER_SITE_OTHER): Payer: Medicare Other

## 2016-02-13 DIAGNOSIS — I10 Essential (primary) hypertension: Secondary | ICD-10-CM | POA: Diagnosis not present

## 2016-02-13 LAB — BASIC METABOLIC PANEL
BUN: 24 mg/dL (ref 7–25)
CHLORIDE: 104 mmol/L (ref 98–110)
CO2: 27 mmol/L (ref 20–31)
Calcium: 9.1 mg/dL (ref 8.6–10.3)
Creat: 1.14 mg/dL — ABNORMAL HIGH (ref 0.70–1.11)
GLUCOSE: 128 mg/dL — AB (ref 65–99)
POTASSIUM: 4.9 mmol/L (ref 3.5–5.3)
SODIUM: 140 mmol/L (ref 135–146)

## 2016-02-14 ENCOUNTER — Ambulatory Visit (INDEPENDENT_AMBULATORY_CARE_PROVIDER_SITE_OTHER)
Admission: RE | Admit: 2016-02-14 | Discharge: 2016-02-14 | Disposition: A | Discharge status: Home / Self Care | Payer: Medicare Other | Source: Ambulatory Visit | Attending: Cardiology | Admitting: Cardiology

## 2016-02-14 DIAGNOSIS — R9389 Abnormal findings on diagnostic imaging of other specified body structures: Secondary | ICD-10-CM

## 2016-02-14 DIAGNOSIS — R938 Abnormal findings on diagnostic imaging of other specified body structures: Secondary | ICD-10-CM | POA: Diagnosis not present

## 2016-02-14 MED ORDER — IOPAMIDOL (ISOVUE-300) INJECTION 61%
80.0000 mL | Freq: Once | INTRAVENOUS | Status: AC | PRN
  Administered 2016-02-14: 80 mL via INTRAVENOUS

## 2016-02-15 ENCOUNTER — Telehealth: Payer: Self-pay | Admitting: *Deleted

## 2016-02-15 DIAGNOSIS — R9389 Abnormal findings on diagnostic imaging of other specified body structures: Secondary | ICD-10-CM

## 2016-02-15 NOTE — Telephone Encounter (Signed)
Spoke with pt re: CT Chest results. He has been made aware that everything looks benign, but does recommend a f/u CXR in 2 months.   Pt verbalized understanding. Order for CXR has been put in EPIC.

## 2016-02-15 NOTE — Telephone Encounter (Signed)
-----   Message from Consuelo Pandy, [place] sent at 02/14/2016  5:38 PM EDT ----- No suspicion for malignancy. Findings suggest a benign process. The radiologist has recommend a f/u CXR in 2  Months to reassess. Please notify patient and schedule CXR in 2 months.

## 2016-03-04 ENCOUNTER — Ambulatory Visit (INDEPENDENT_AMBULATORY_CARE_PROVIDER_SITE_OTHER): Payer: Medicare Other | Admitting: *Deleted

## 2016-03-04 DIAGNOSIS — I480 Paroxysmal atrial fibrillation: Secondary | ICD-10-CM | POA: Diagnosis not present

## 2016-03-04 DIAGNOSIS — Z7901 Long term (current) use of anticoagulants: Secondary | ICD-10-CM | POA: Diagnosis not present

## 2016-03-04 DIAGNOSIS — I4891 Unspecified atrial fibrillation: Secondary | ICD-10-CM

## 2016-03-04 DIAGNOSIS — Z5181 Encounter for therapeutic drug level monitoring: Secondary | ICD-10-CM | POA: Diagnosis not present

## 2016-03-04 LAB — POCT INR: INR: 3.3

## 2016-03-12 ENCOUNTER — Ambulatory Visit (INDEPENDENT_AMBULATORY_CARE_PROVIDER_SITE_OTHER): Payer: Medicare Other | Admitting: Family

## 2016-03-12 ENCOUNTER — Encounter: Payer: Self-pay | Admitting: Family

## 2016-03-12 VITALS — BP 131/71 | HR 66 | Temp 97.9°F | Resp 16 | Ht 77.0 in | Wt 191.8 lb

## 2016-03-12 DIAGNOSIS — H9193 Unspecified hearing loss, bilateral: Secondary | ICD-10-CM

## 2016-03-12 DIAGNOSIS — H6123 Impacted cerumen, bilateral: Secondary | ICD-10-CM

## 2016-03-12 NOTE — Progress Notes (Signed)
Pre visit review using our clinic review tool, if applicable. No additional management support is needed unless otherwise documented below in the visit note. 

## 2016-03-12 NOTE — Progress Notes (Signed)
Subjective:    Patient ID: [name] [name], male    DOB: [birth year], 82 y.o.   MRN: [id number]  HPI  [name] [name] is an 82 yr old male with hearing loss, desires referral to audiology. Notes more difficulty hearing sermon at church.     Review of Systems See HPI  Past Medical History  Diagnosis Date  . Prostate cancer ([place])   . Melanoma ([place])     Left Shoulder  . Vision abnormalities     Cornea scarring  . Osteoarthritis     Knees  . Hypertension   . Pure hypercholesterolemia   . Depressive disorder, not elsewhere classified   . MVP (mitral valve prolapse)     a. With severe MR s/p Complex valvuloplasty including artificial Gore-tex neochord placement x4, chordal transposition x1, chordal release x1, # 32 mm Sorin Memo 3D Ring Annuloplasty 2012.  Marland Kitchen Personal history of colonic polyps   . Thrombocytopenia ([place])   . Mitral regurgitation   . PVC (premature ventricular contraction)   . Neuropathy ([place])   . Adenomatous polyps   . Internal nasal lesion 05/15/2013  . PAF (paroxysmal atrial fibrillation) ([place])     a. Post-op MVR 2012.  . Pulmonary HTN ([place])     a. Mild-mod by cath 2012.  Marland Kitchen Normal coronary arteries     a. Normal coronary anatomy by cath 2012.     Social History   Social History  . Marital Status: Widowed    Spouse Name: N/A  . Number of Children: N/A  . Years of Education: N/A   Occupational History  . Not on file.   Social History Main Topics  . Smoking status: Never Smoker   . Smokeless tobacco: Never Used  . Alcohol Use: No  . Drug Use: No  . Sexual Activity: Not on file   Other Topics Concern  . Not on file   Social History Narrative   Retired - Optometrist   Widower   2 children    Past Surgical History  Procedure Laterality Date  . Nuclear stress test  09/2006    EF-64%, Normal  . US echocardiography  09/2009, 08/1011    mild LVH,mild AI,MVP with mild MR, mild-mod. TR with mild Pulm. HTN, EF-55-60%  . Inguinal hernia repair  09/2009     Left  . Knee arthroscopy       left x3  and right x2  . Colonoscopy w/ polypectomy    . Prostatectomy  1993  . Rotator cuff repair  2003    left  . Melanoma surgery      2001, 2005, 2006, 2009  . Tee without cardioversion  09/26/2011    Procedure: TRANSESOPHAGEAL ECHOCARDIOGRAM (TEE);  Surgeon: Lelon Perla, MD;  Location: [facility] ENDOSCOPY;  Service: Cardiovascular;  Laterality: N/A;  . Mitral valve repair  10/01/2011    complex valvuloplasty with Goretex cord replacement and chordal transposition 88mm Sorin Memo 3D ring annuloplasty  . Root canal  08-19-12  . Cardiac catheterization  09/2011    Pre-op for MVR -- normal coronaries.  . Cardioversion N/A 01/02/2016    Procedure: CARDIOVERSION;  Surgeon: Thayer Headings, MD;  Location: [facility] ENDOSCOPY;  Service: Cardiovascular;  Laterality: N/A;    Family History  Problem Relation Age of Onset  . Clotting disorder Brother     CVA's  . Arthritis Mother   . Hypertension Mother   . Stroke Mother   . Hypertension Father   . Psychosis Father  psychiatric care  . Colon cancer Neg Hx   . Stomach cancer Neg Hx     No Known Allergies  Current Outpatient Prescriptions on File Prior to Visit  Medication Sig Dispense Refill  . acetaminophen (TYLENOL) 325 MG tablet Take 325 mg by mouth 2 (two) times daily. For pain    . amiodarone (PACERONE) 200 MG tablet Take 1 tablet (200 mg total) by mouth 2 (two) times daily. 60 tablet 3  . diltiazem (CARDIZEM CD) 300 MG 24 hr capsule Take 1 capsule (300 mg total) by mouth daily. 90 capsule 1  . docusate sodium (COLACE) 100 MG capsule Take 200 mg by mouth daily. 2  DAILY    . furosemide (LASIX) 40 MG tablet Take 1 tablet (40 mg total) by mouth 2 (two) times daily. 180 tablet 3  . Glucosamine-Chondroitin (GLUCOSAMINE CHONDR COMPLEX PO) Take 1 capsule by mouth 2 (two) times daily.     Marland Kitchen lactose free nutrition (BOOST) LIQD Take 1 Container by mouth daily.    . metoprolol succinate (TOPROL-XL) 50 MG 24  hr tablet Take 1 tablet (50 mg total) by mouth daily. Take with or immediately following a meal. 10 tablet 0  . Multiple Vitamins-Minerals (MULTIVITAMINS THER. W/MINERALS) TABS Take 1 tablet by mouth daily. 30 each   . potassium chloride SA (K-DUR,KLOR-CON) 20 MEQ tablet Take 2 tablets (40 mEq total) by mouth daily. 180 tablet 3  . RESTASIS 0.05 % ophthalmic emulsion Place 1 drop into both eyes every 12 (twelve) hours.     . rosuvastatin (CRESTOR) 5 MG tablet Take 1 tablet (5 mg total) by mouth daily. 90 tablet 2  . sulfamethoxazole-trimethoprim (BACTRIM,SEPTRA) 400-80 MG tablet TK 1 T PO BID  0  . traMADol (ULTRAM) 50 MG tablet Take 50 mg by mouth as needed for moderate pain.     . traZODone (DESYREL) 100 MG tablet Take 300 mg by mouth at bedtime.     . vitamin C (ASCORBIC ACID) 500 MG tablet Take 500 mg by mouth 2 (two) times daily.    Marland Kitchen warfarin (COUMADIN) 5 MG tablet Take by mouth daily. Take by mouth daily as directed by the coumadin clinic    . [DISCONTINUED] pantoprazole (PROTONIX) 40 MG tablet Take 1 tablet (40 mg total) by mouth daily before breakfast.     No current facility-administered medications on file prior to visit.    BP 131/71 mmHg  Pulse 66  Temp(Src) 97.9 F (36.6 C) (Oral)  Resp 16  Ht 6\' 5"  (1.956 m)  Wt 191 lb 12.8 oz (87 kg)  BMI 22.74 kg/m2  SpO2 99%       Objective:   Physical Exam  Constitutional: He is oriented to person, place, and time. He appears well-developed and well-nourished. No distress.  HENT:  Head: Normocephalic and atraumatic.  Bilateral cerumen impaction R>L  After cerumen removal normal TM's bilaterally are revealed.  Musculoskeletal: He exhibits no edema.  Neurological: He is alert and oriented to person, place, and time.  Skin: Skin is warm and dry.  Psychiatric: He has a normal mood and affect. His behavior is normal. Thought content normal.          Assessment & Plan:  Hearing loss- refer to audiology  Cerumen impaction-  Ceruminosis is noted.  Wax is removed by syringing and manual debridement.

## 2016-03-12 NOTE — Patient Instructions (Signed)
You will be contacted about your referral to the Audiologist for hearing test.

## 2016-03-18 ENCOUNTER — Ambulatory Visit (INDEPENDENT_AMBULATORY_CARE_PROVIDER_SITE_OTHER): Payer: Medicare Other | Admitting: *Deleted

## 2016-03-18 DIAGNOSIS — I480 Paroxysmal atrial fibrillation: Secondary | ICD-10-CM

## 2016-03-18 DIAGNOSIS — I4891 Unspecified atrial fibrillation: Secondary | ICD-10-CM

## 2016-03-18 DIAGNOSIS — Z5181 Encounter for therapeutic drug level monitoring: Secondary | ICD-10-CM

## 2016-03-18 DIAGNOSIS — Z7901 Long term (current) use of anticoagulants: Secondary | ICD-10-CM | POA: Diagnosis not present

## 2016-03-18 LAB — POCT INR: INR: 2.7

## 2016-03-28 ENCOUNTER — Other Ambulatory Visit: Payer: Self-pay

## 2016-03-28 MED ORDER — AMIODARONE HCL 200 MG PO TABS: 200.0000 mg | ORAL_TABLET | Freq: Two times a day (BID) | ORAL | Status: DC

## 2016-03-28 NOTE — Telephone Encounter (Signed)
Consuelo Pandy, PA-C at 02/12/2016 9:45 AM  amiodarone (PACERONE) 200 MG tabletTake 1 tablet (200 mg total) by mouth 2 (two) times daily ASSESSMENT AND PLAN:   1. PAF: will continue current dose of Amiodarone now, given patient reports he is undergoing treatment for UTI. He has a h/o reverting into afib with UTIs. Will keep current dose to help maintin NSR. Dr. Irish Lack may choose to reduce dose at time of next f/u if still in SR. Countinue Coumadin for a/c. INRs are followed in our office. This will be checked today and dosed by pharmacy.  Patient Instructions     Medication Instructions:  Your physician recommends that you continue on your current medications as directed. Please refer to the Current Medication list given to you today.

## 2016-03-28 NOTE — Telephone Encounter (Signed)
Changed pharmacy   Expand All Collapse All   Consuelo Pandy, PA-C at 02/12/2016 9:45 AM  amiodarone (PACERONE) 200 MG tabletTake 1 tablet (200 mg total) by mouth 2 (two) times daily ASSESSMENT AND PLAN:   1. PAF: will continue current dose of Amiodarone now, given patient reports he is undergoing treatment for UTI. He has a h/o reverting into afib with UTIs. Will keep current dose to help maintin NSR. Dr. Irish Lack may choose to reduce dose at time of next f/u if still in SR. Countinue Coumadin for a/c. INRs are followed in our office. This will be checked today and dosed by pharmacy.  Patient Instructions     Medication Instructions:  Your physician recommends that you continue on your current medications as directed. Please refer to the Current Medication list given to you today.

## 2016-04-01 ENCOUNTER — Other Ambulatory Visit: Payer: Self-pay

## 2016-04-01 MED ORDER — DILTIAZEM HCL ER COATED BEADS 300 MG PO CP24: 300.0000 mg | ORAL_CAPSULE | Freq: Every day | ORAL | Status: DC

## 2016-04-01 NOTE — Telephone Encounter (Signed)
Consuelo Pandy, PA-C at 02/12/2016 9:45 AM  diltiazem (CARDIZEM CD) 300 MG 24 hr capsuleTake 1 capsule (300 mg total) by mouth daily  Patient Instructions     Medication Instructions:  Your physician recommends that you continue on your current medications as directed. Please refer to the Current Medication list given to you today

## 2016-04-11 ENCOUNTER — Other Ambulatory Visit: Payer: Self-pay | Admitting: Cardiology

## 2016-04-11 ENCOUNTER — Ambulatory Visit
Admission: RE | Admit: 2016-04-11 | Discharge: 2016-04-11 | Disposition: A | Discharge status: Home / Self Care | Payer: Medicare Other | Source: Ambulatory Visit | Attending: Physician Assistant | Admitting: Physician Assistant

## 2016-04-11 DIAGNOSIS — R9389 Abnormal findings on diagnostic imaging of other specified body structures: Secondary | ICD-10-CM

## 2016-04-11 MED ORDER — DILTIAZEM HCL ER COATED BEADS 300 MG PO CP24: 300.0000 mg | ORAL_CAPSULE | Freq: Every day | ORAL | Status: DC

## 2016-04-17 ENCOUNTER — Encounter: Payer: Self-pay | Admitting: Interventional Cardiology

## 2016-04-17 ENCOUNTER — Ambulatory Visit (INDEPENDENT_AMBULATORY_CARE_PROVIDER_SITE_OTHER): Payer: Medicare Other | Admitting: Interventional Cardiology

## 2016-04-17 ENCOUNTER — Ambulatory Visit (INDEPENDENT_AMBULATORY_CARE_PROVIDER_SITE_OTHER): Payer: Medicare Other | Admitting: *Deleted

## 2016-04-17 VITALS — BP 120/80 | HR 72 | Ht 77.0 in | Wt 182.0 lb

## 2016-04-17 DIAGNOSIS — Z7901 Long term (current) use of anticoagulants: Secondary | ICD-10-CM | POA: Diagnosis not present

## 2016-04-17 DIAGNOSIS — Z9889 Other specified postprocedural states: Secondary | ICD-10-CM

## 2016-04-17 DIAGNOSIS — I480 Paroxysmal atrial fibrillation: Secondary | ICD-10-CM

## 2016-04-17 DIAGNOSIS — I10 Essential (primary) hypertension: Secondary | ICD-10-CM

## 2016-04-17 DIAGNOSIS — Z5181 Encounter for therapeutic drug level monitoring: Secondary | ICD-10-CM

## 2016-04-17 DIAGNOSIS — I4891 Unspecified atrial fibrillation: Secondary | ICD-10-CM

## 2016-04-17 DIAGNOSIS — I4892 Unspecified atrial flutter: Secondary | ICD-10-CM

## 2016-04-17 LAB — POCT INR: INR: 4.1

## 2016-04-17 MED ORDER — AMIODARONE HCL 200 MG PO TABS: 200.0000 mg | ORAL_TABLET | Freq: Every day | ORAL | Status: DC

## 2016-04-17 MED ORDER — FUROSEMIDE 40 MG PO TABS: 40.0000 mg | ORAL_TABLET | ORAL | Status: DC

## 2016-04-17 NOTE — Progress Notes (Signed)
Cardiology Office Note   Date:  04/17/2016   ID:  [name] [name], DOB 03-03-1931, MRN [id number]  PCP:  Penni Homans, MD    No chief complaint on file. f/u AFib   Wt Readings from Last 3 Encounters:  04/17/16 182 lb (82.555 kg)  03/12/16 191 lb 12.8 oz (87 kg)  02/12/16 187 lb 12.8 oz (85.186 kg)       History of Present Illness: [name] [name] is a 82 y.o. [name]  followed in the past by Dr. Mare Ferrari. His past cardiac history is notable for PAF/Flutter. He is on chronic anticoagulation with Coumadin. He also has a h/o severe mitral valve prolapse. He underwent mitral valve repair by Dr. Roxy Manns on 10/01/11. He did not have any coronary disease. In May 2015 the patient had an episode of syncope at home secondary to dehydration and a urinary tract infection. He had atrial fibrillation recurrence felt to be secondary to the stress of the UTI. He subsequently went back in normal sinus rhythm and was continued on warfarin.   The patient was seen on 12/05/15 and at that time he was noted to be back in atrial flutter with variable ventricular response. 2D echo was obtained showingan EF of 45-50% with G2DD. There was grade 2 diastolic dysfunction. Mitral valve shows satisfactory function with mild stenosis and mild regurgitation.The left atrium was mildly dilated. The right atrium was severely dilated. He was started on amiodarone. His warfarin was continued.He did not convert on amiodarone. Therefore outpatient cardioversion was arranged.On 01/02/16, the patient underwent synchronized cardioversion using 50 J by Dr. Acie Fredrickson with successful cardioversion to normal sinus rhythm from atrial flutter.  Also of note, he had a recent CXR in 12/2015 which showed decreased aeration at the base and a follow-up chest 2 x-ray was recommended to assure clearing. Dr. Mare Ferrari arranged for this to be done. F/u CXR was done 02/07/16. Radiologist reports is as follows:  IMPRESSION: 1. Stable opacity at  the left lung base overlying the left heart border, with additional stable blunting of the adjacent left costophrenic angle. As described on the earlier chest x-ray report, this could represent infiltrate and adjacent pleural effusion, but underlying mass not excluded. Another consideration would be chronic pleural thickening/scarring. Given the persistence since chest x-ray of 12/27/2015, would consider chest CT at this point to exclude the possibility of neoplastic mass. 2. Hyperexpanded lungs suggesting COPD.  He denies any symptoms of breakthrough atrial fibrillation since his cardioversion. No palpitations, dyspnea or chest pain. He also denies wheezing and productive cough. He denies any prior h/o tobacco abuse. No previous occupational exposures.  He has been fully compliant with Coumadin. No significant abnormal bleeding, other than bruising. His INR is followed in our Coumadin Clinic.   Had a negative chest CT scan for malignancy.  Still has atelectasis on his most recent CXray.  No SHOB.    He no longer goes to the [facility], but is a member.     Past Medical History  Diagnosis Date  . Prostate cancer ([place])   . Melanoma ([place])     Left Shoulder  . Vision abnormalities     Cornea scarring  . Osteoarthritis     Knees  . Hypertension   . Pure hypercholesterolemia   . Depressive disorder, not elsewhere classified   . MVP (mitral valve prolapse)     a. With severe MR s/p Complex valvuloplasty including artificial Gore-tex neochord placement x4, chordal transposition x1, chordal release x1, #  32 mm Sorin Memo 3D Ring Annuloplasty 2012.  Marland Kitchen Personal history of colonic polyps   . Thrombocytopenia ([place])   . Mitral regurgitation   . PVC (premature ventricular contraction)   . Neuropathy ([place])   . Adenomatous polyps   . Internal nasal lesion 05/15/2013  . PAF (paroxysmal atrial fibrillation) ([place])     a. Post-op MVR 2012.  . Pulmonary HTN ([place])     a. Mild-mod by cath 2012.  Marland Kitchen  Normal coronary arteries     a. Normal coronary anatomy by cath 2012.    Past Surgical History  Procedure Laterality Date  . Nuclear stress test  09/2006    EF-64%, Normal  . US echocardiography  09/2009, 08/1011    mild LVH,mild AI,MVP with mild MR, mild-mod. TR with mild Pulm. HTN, EF-55-60%  . Inguinal hernia repair  09/2009    Left  . Knee arthroscopy       left x3  and right x2  . Colonoscopy w/ polypectomy    . Prostatectomy  1993  . Rotator cuff repair  2003    left  . Melanoma surgery      2001, 2005, 2006, 2009  . Tee without cardioversion  09/26/2011    Procedure: TRANSESOPHAGEAL ECHOCARDIOGRAM (TEE);  Surgeon: Lelon Perla, MD;  Location: [facility] ENDOSCOPY;  Service: Cardiovascular;  Laterality: N/A;  . Mitral valve repair  10/01/2011    complex valvuloplasty with Goretex cord replacement and chordal transposition 29mm Sorin Memo 3D ring annuloplasty  . Root canal  08-19-12  . Cardiac catheterization  09/2011    Pre-op for MVR -- normal coronaries.  . Cardioversion N/A 01/02/2016    Procedure: CARDIOVERSION;  Surgeon: Thayer Headings, MD;  Location: [facility] ENDOSCOPY;  Service: Cardiovascular;  Laterality: N/A;     Current Outpatient Prescriptions  Medication Sig Dispense Refill  . acetaminophen (TYLENOL) 325 MG tablet Take 325 mg by mouth 2 (two) times daily. For pain    . amiodarone (PACERONE) 200 MG tablet Take 1 tablet (200 mg total) by mouth 2 (two) times daily. 60 tablet 6  . diltiazem (CARDIZEM CD) 300 MG 24 hr capsule Take 1 capsule (300 mg total) by mouth daily. 90 capsule 0  . docusate sodium (COLACE) 100 MG capsule Take 200 mg by mouth daily. 2  DAILY    . furosemide (LASIX) 40 MG tablet Take 1 tablet (40 mg total) by mouth 2 (two) times daily. 180 tablet 3  . Glucosamine-Chondroitin (GLUCOSAMINE CHONDR COMPLEX PO) Take 1 capsule by mouth 2 (two) times daily.     Marland Kitchen lactose free nutrition (BOOST) LIQD Take 1 Container by mouth daily.    . metoprolol succinate  (TOPROL-XL) 50 MG 24 hr tablet Take 1 tablet (50 mg total) by mouth daily. Take with or immediately following a meal. 10 tablet 0  . Multiple Vitamins-Minerals (MULTIVITAMINS THER. W/MINERALS) TABS Take 1 tablet by mouth daily. 30 each   . potassium chloride SA (K-DUR,KLOR-CON) 20 MEQ tablet Take 2 tablets (40 mEq total) by mouth daily. 180 tablet 3  . RESTASIS 0.05 % ophthalmic emulsion Place 1 drop into both eyes every 12 (twelve) hours.     . rosuvastatin (CRESTOR) 5 MG tablet Take 1 tablet (5 mg total) by mouth daily. 90 tablet 2  . traMADol (ULTRAM) 50 MG tablet Take 50 mg by mouth as needed for moderate pain.     . traZODone (DESYREL) 100 MG tablet Take 300 mg by mouth at bedtime.     Marland Kitchen  vitamin C (ASCORBIC ACID) 500 MG tablet Take 500 mg by mouth 2 (two) times daily.    Marland Kitchen warfarin (COUMADIN) 5 MG tablet Take by mouth daily. Take by mouth daily as directed by the coumadin clinic    . [DISCONTINUED] pantoprazole (PROTONIX) 40 MG tablet Take 1 tablet (40 mg total) by mouth daily before breakfast.     No current facility-administered medications for this visit.    Allergies:   Review of patient's allergies indicates no known allergies.    Social History:  The patient  reports that he has never smoked. He has never used smokeless tobacco. He reports that he does not drink alcohol or use illicit drugs.   Family History:  The patient's family history includes Arthritis in his mother; Clotting disorder in his brother; Hypertension in his father and mother; Psychosis in his father; Stroke in his mother. There is no history of Colon cancer, Stomach cancer, or Heart attack.    ROS:  Please see the history of present illness.   Otherwise, review of systems are positive for swelling in his legs when he misses his Lasix.   All other systems are reviewed and negative.    PHYSICAL EXAM: VS:  BP 120/80 mmHg  Pulse 72  Ht 6\' 5"  (1.956 m)  Wt 182 lb (82.555 kg)  BMI 21.58 kg/m2 , BMI Body mass index  is 21.58 kg/(m^2). GEN: Well nourished, well developed, in no acute distress HEENT: normal Neck: no JVD, carotid bruits, or masses Cardiac: RRR; no murmurs, rubs, or gallops,no edema  Respiratory:  clear to auscultation bilaterally, normal work of breathing GI: soft, nontender, nondistended, + BS MS: no deformity or atrophy Skin: warm and dry, no rash Neuro:  Strength and sensation are intact Psych: euthymic mood, full affect   EKG:   The ekg ordered today demonstrates NSR, no ST segment changes   Recent Labs: 12/12/2015: ALT 22; TSH 1.401 12/27/2015: Hemoglobin 14.8; Platelets 85* 02/13/2016: BUN 24; Creat 1.14*; Potassium 4.9; Sodium 140   Lipid Panel    Component Value Date/Time   CHOL 112* 11/02/2015 1048   TRIG 66 11/02/2015 1048   HDL 49 11/02/2015 1048   CHOLHDL 2.3 11/02/2015 1048   VLDL 13 11/02/2015 1048   LDLCALC 50 11/02/2015 1048     Other studies Reviewed: Additional studies/ records that were reviewed today with results demonstrating: .   ASSESSMENT AND PLAN:  1. PAF: Maintainting NSR.  Decrease amiodarone to 200 mg daily. 2. Systolic and diastolic heart failure: chronic:  Mild leg swelling.  He will titrate Lasix dose to 40 mg QOD. 3. Anticoagulation: Warfarin for stroke prevention. 4. Abnormal chest xray: CT scan showed no malignancy 5. Try to get more exercise on a stationary bike.    Current medicines are reviewed at length with the patient today.  The patient concerns regarding his medicines were addressed.  The following changes have been made:  No change  Labs/ tests ordered today include:   Orders Placed This Encounter  Procedures  . EKG 12-Lead    Recommend 150 minutes/week of aerobic exercise Low fat, low carb, high fiber diet recommended  Disposition:   FU in 4 moinths per the patient's request   Signed, Larae Grooms, MD  04/17/2016 2:12 PM    [facility] [address], [place], [place]  [address] Phone:  [phone]; Fax: [phone]

## 2016-04-17 NOTE — Patient Instructions (Signed)
**Note De-Identified  Obfuscation** Medication Instructions:  Decrease Amiodarone to 200 mg once daily and decrease Lasix to taking a dose every other day-all other medications remain the same.  Labwork: None  Testing/Procedures: None  Follow-Up: Your physician wants you to follow-up in: 4 months with an APP. You will receive a reminder letter in the mail two months in advance. If you don't receive a letter, please call our office to schedule the follow-up appointment.     If you need a refill on your cardiac medications before your next appointment, please call your pharmacy.

## 2016-05-01 ENCOUNTER — Ambulatory Visit (INDEPENDENT_AMBULATORY_CARE_PROVIDER_SITE_OTHER): Payer: Medicare Other

## 2016-05-01 DIAGNOSIS — Z7901 Long term (current) use of anticoagulants: Secondary | ICD-10-CM

## 2016-05-01 DIAGNOSIS — I4891 Unspecified atrial fibrillation: Secondary | ICD-10-CM

## 2016-05-01 DIAGNOSIS — I480 Paroxysmal atrial fibrillation: Secondary | ICD-10-CM

## 2016-05-01 DIAGNOSIS — Z5181 Encounter for therapeutic drug level monitoring: Secondary | ICD-10-CM

## 2016-05-01 LAB — POCT INR: INR: 3.2

## 2016-05-06 ENCOUNTER — Other Ambulatory Visit: Payer: Self-pay | Admitting: *Deleted

## 2016-05-06 MED ORDER — DILTIAZEM HCL ER COATED BEADS 300 MG PO CP24: 300.0000 mg | ORAL_CAPSULE | Freq: Every day | ORAL | Status: DC

## 2016-05-06 NOTE — Telephone Encounter (Addendum)
SENT TOLD TO HOLD UNTIL NEEDED

## 2016-05-12 ENCOUNTER — Other Ambulatory Visit: Payer: Self-pay

## 2016-05-12 MED ORDER — WARFARIN SODIUM 5 MG PO TABS: 5.0000 mg | ORAL_TABLET | Freq: Every day | ORAL | Status: DC

## 2016-05-15 ENCOUNTER — Ambulatory Visit (INDEPENDENT_AMBULATORY_CARE_PROVIDER_SITE_OTHER): Payer: Medicare Other | Admitting: *Deleted

## 2016-05-15 DIAGNOSIS — I480 Paroxysmal atrial fibrillation: Secondary | ICD-10-CM | POA: Diagnosis not present

## 2016-05-15 DIAGNOSIS — Z5181 Encounter for therapeutic drug level monitoring: Secondary | ICD-10-CM | POA: Diagnosis not present

## 2016-05-15 DIAGNOSIS — I4891 Unspecified atrial fibrillation: Secondary | ICD-10-CM

## 2016-05-15 DIAGNOSIS — Z7901 Long term (current) use of anticoagulants: Secondary | ICD-10-CM

## 2016-05-15 LAB — POCT INR: INR: 2.2

## 2016-05-16 ENCOUNTER — Ambulatory Visit: Payer: Medicare Other | Admitting: Family Medicine

## 2016-05-27 ENCOUNTER — Encounter: Payer: Self-pay | Admitting: Neurology

## 2016-05-27 ENCOUNTER — Ambulatory Visit (INDEPENDENT_AMBULATORY_CARE_PROVIDER_SITE_OTHER): Payer: Medicare Other | Admitting: Neurology

## 2016-05-27 VITALS — BP 128/68 | HR 60 | Ht 77.0 in | Wt 189.0 lb

## 2016-05-27 DIAGNOSIS — R269 Unspecified abnormalities of gait and mobility: Secondary | ICD-10-CM

## 2016-05-27 DIAGNOSIS — R202 Paresthesia of skin: Secondary | ICD-10-CM | POA: Insufficient documentation

## 2016-05-27 DIAGNOSIS — E538 Deficiency of other specified B group vitamins: Secondary | ICD-10-CM | POA: Diagnosis not present

## 2016-05-27 HISTORY — DX: Unspecified abnormalities of gait and mobility: R26.9

## 2016-05-27 NOTE — Progress Notes (Signed)
Reason for visit: Paresthesias, numbness  Referring physician: Dr. Ulysees Barns is a 82 y.o. male  History of present illness:  [name] [name] is an 82 year old right-handed white male with a greater than 10 year history of some numbness in the feet. The patient believes that the symptoms have been slowly progressive over many years, but in the last several months his symptoms have progressed fairly rapidly. The patient has had some increased symptoms in his lower extremities over the last 3 or 4 months, but within the last month his fingers have also had some sensation of numbness and tingling. The patient denies any severe back or neck discomfort, he does have a history of lumbosacral spinal stenosis most significant at the L4-5 level with some mild spinal stenosis at the L3-4 and L2-3 levels. The patient has had prostate surgery and he has some difficulty with bladder control. He has been using a cane for the last 6 months or so, he reports a fall within the last 2 weeks. The patient has a sensation of numbness mainly across the ankle in the feet, and in the hands and fingers. He denies numbness on the body. He denies any change in strength. He is sent to this office for further evaluation.  Past Medical History  Diagnosis Date  . Prostate cancer ([place])   . Melanoma ([place])     Left Shoulder  . Vision abnormalities     Cornea scarring  . Osteoarthritis     Knees  . Hypertension   . Pure hypercholesterolemia   . Depressive disorder, not elsewhere classified   . MVP (mitral valve prolapse)     a. With severe MR s/p Complex valvuloplasty including artificial Gore-tex neochord placement x4, chordal transposition x1, chordal release x1, # 32 mm Sorin Memo 3D Ring Annuloplasty 2012.  Marland Kitchen Personal history of colonic polyps   . Thrombocytopenia ([place])   . Mitral regurgitation   . PVC (premature ventricular contraction)   . Neuropathy ([place])   . Adenomatous polyps   . Internal nasal  lesion 05/15/2013  . PAF (paroxysmal atrial fibrillation) ([place])     a. Post-op MVR 2012.  . Pulmonary HTN ([place])     a. Mild-mod by cath 2012.  Marland Kitchen Normal coronary arteries     a. Normal coronary anatomy by cath 2012.  . Abnormality of gait 05/27/2016    Past Surgical History  Procedure Laterality Date  . Nuclear stress test  09/2006    EF-64%, Normal  . US echocardiography  09/2009, 08/1011    mild LVH,mild AI,MVP with mild MR, mild-mod. TR with mild Pulm. HTN, EF-55-60%  . Inguinal hernia repair  09/2009    Left  . Knee arthroscopy       left x3  and right x2  . Colonoscopy w/ polypectomy    . Prostatectomy  1993  . Rotator cuff repair  2003    left  . Melanoma surgery      2001, 2005, 2006, 2009  . Tee without cardioversion  09/26/2011    Procedure: TRANSESOPHAGEAL ECHOCARDIOGRAM (TEE);  Surgeon: Lelon Perla, MD;  Location: [facility] ENDOSCOPY;  Service: Cardiovascular;  Laterality: N/A;  . Mitral valve repair  10/01/2011    complex valvuloplasty with Goretex cord replacement and chordal transposition 42mm Sorin Memo 3D ring annuloplasty  . Root canal  08-19-12  . Cardiac catheterization  09/2011    Pre-op for MVR -- normal coronaries.  . Cardioversion N/A 01/02/2016  Procedure: CARDIOVERSION;  Surgeon: Thayer Headings, MD;  Location: [facility] ENDOSCOPY;  Service: Cardiovascular;  Laterality: N/A;    Family History  Problem Relation Age of Onset  . Clotting disorder Brother     CVA's  . Arthritis Mother   . Hypertension Mother   . Stroke Mother   . Hypertension Father   . Psychosis Father     psychiatric care  . Colon cancer Neg Hx   . Stomach cancer Neg Hx   . Heart attack Neg Hx     Social history:  reports that he has never smoked. He has never used smokeless tobacco. He reports that he does not drink alcohol or use illicit drugs.  Medications:  Prior to Admission medications   Medication Sig Start Date End Date Taking? Authorizing Provider  acetaminophen (TYLENOL)  325 MG tablet Take 325 mg by mouth 2 (two) times daily. For pain   Yes Historical Provider, MD  amiodarone (PACERONE) 200 MG tablet Take 1 tablet (200 mg total) by mouth daily. 04/17/16  Yes [name], MD  diltiazem (CARDIZEM CD) 300 MG 24 hr capsule Take 1 capsule (300 mg total) by mouth daily. 05/06/16  Yes [name], MD  docusate sodium (COLACE) 100 MG capsule Take 200 mg by mouth daily. 2  DAILY   Yes Historical Provider, MD  furosemide (LASIX) 40 MG tablet Take 1 tablet (40 mg total) by mouth every other day. 04/17/16  Yes [name], MD  gabapentin (NEURONTIN) 300 MG capsule  05/15/16  Yes Historical Provider, MD  Glucosamine-Chondroitin (GLUCOSAMINE CHONDR COMPLEX PO) Take 1 capsule by mouth 2 (two) times daily.    Yes Historical Provider, MD  lactose free nutrition (BOOST) LIQD Take 1 Container by mouth daily.   Yes Historical Provider, MD  metoprolol succinate (TOPROL-XL) 50 MG 24 hr tablet Take 1 tablet (50 mg total) by mouth daily. Take with or immediately following a meal. 07/17/15  Yes Darlin Coco, MD  mirabegron ER (MYRBETRIQ) 25 MG TB24 tablet Take 25 mg by mouth daily.   Yes Historical Provider, MD  Multiple Vitamins-Minerals (MULTIVITAMINS THER. W/MINERALS) TABS Take 1 tablet by mouth daily. 10/16/11  Yes Donielle Liston Alba, PA-C  potassium chloride SA (K-DUR,KLOR-CON) 20 MEQ tablet Take 2 tablets (40 mEq total) by mouth daily. 11/22/15  Yes Darlin Coco, MD  RESTASIS 0.05 % ophthalmic emulsion Place 1 drop into both eyes every 12 (twelve) hours.  06/09/11  Yes Historical Provider, MD  rosuvastatin (CRESTOR) 5 MG tablet Take 1 tablet (5 mg total) by mouth daily. 10/17/15  Yes Darlin Coco, MD  traMADol (ULTRAM) 50 MG tablet Take 50 mg by mouth as needed for moderate pain.    Yes Historical Provider, MD  traZODone (DESYREL) 100 MG tablet Take 300 mg by mouth at bedtime.  10/17/11  Yes Donielle Liston Alba, PA-C  vitamin C (ASCORBIC ACID) 500 MG tablet  Take 500 mg by mouth 2 (two) times daily.   Yes Historical Provider, MD  warfarin (COUMADIN) 5 MG tablet Take 1 tablet (5 mg total) by mouth daily. Take by mouth daily as directed by the coumadin clinic 05/12/16  Yes [name], MD     No Known Allergies  ROS:  Out of a complete 14 system review of symptoms, the patient complains only of the following symptoms, and all other reviewed systems are negative.  Swelling in the legs Hearing loss Moles Constipation Urination problems Easy bruising Joint pain Numbness  Blood pressure 128/68,  pulse 60, height 6\' 5"  (1.956 m), weight 189 lb (85.73 kg).  Physical Exam  General: The patient is alert and cooperative at the time of the examination.  Eyes: Pupils are equal, round, and reactive to light. Discs are flat bilaterally.  Neck: The neck is supple, no carotid bruits are noted.  Respiratory: The respiratory examination is clear.  Cardiovascular: The cardiovascular examination reveals a regular rate and rhythm, no obvious murmurs or rubs are noted.  Skin: Extremities are with 2+ edema of ankles bilaterally.  Neurologic Exam  Mental status: The patient is alert and oriented x 3 at the time of the examination. The patient has apparent normal recent and remote memory, with an apparently normal attention span and concentration ability.  Cranial nerves: Facial symmetry is present. There is good sensation of the face to pinprick and soft touch bilaterally. The strength of the facial muscles and the muscles to head turning and shoulder shrug are normal bilaterally. Speech is well enunciated, no aphasia or dysarthria is noted. Extraocular movements are full. Visual fields are full. The tongue is midline, and the patient has symmetric elevation of the soft palate. No obvious hearing deficits are noted.  Motor: The motor testing reveals 5 over 5 strength of all 4 extremities. Good symmetric motor tone is noted throughout.  Sensory:  Sensory testing is intact to pinprick, soft touch, vibration sensation, and position sense on all 4 extremities, with exception of a stocking pattern pinprick sensory deficit one half way up the legs bilaterally, moderate impairment of vibration sensation and mild impairment of position sense in the feet. No evidence of extinction is noted.  Coordination: Cerebellar testing reveals good finger-nose-finger and heel-to-shin bilaterally.  Gait and station: Gait is slightly wide-based, the patient uses a cane for ambulation. Tandem gait is unsteady. Romberg is negative. No drift is seen.  Reflexes: Deep tendon reflexes are fairly well maintained throughout, the patient has maintained ankle jerk reflexes bilaterally, more prominent on the right than the left. Toes are downgoing bilaterally.   Assessment/Plan:  1. Paresthesias and numbness all 4 extremities  2. Gait disorder  The patient has symptoms that could be consistent with a peripheral neuropathy, but clinical examination reveals a well-maintained reflexes throughout including ankle jerk reflexes. The patient could potentially have a small fiber neuropathy, but other metabolic issues such as vitamin B12 deficiencies need to be excluded, the possibility of a low-grade cervical myelopathy should be considered. The patient will be set up for blood work today, he will have nerve conduction studies on the lower extremities and one arm, EMG on one leg. If the studies are unremarkable, MRI of the cervical spine will be done. He will follow-up for the EMG evaluation.  Jill Alexanders MD 05/27/2016 7:00 PM  [facility] [address] [place],  [address]  Phone [phone] Fax [phone]

## 2016-05-27 NOTE — Patient Instructions (Signed)
Fall Prevention in the Home  Falls can cause injuries and can affect people from all age groups. There are many simple things that you can do to make your home safe and to help prevent falls. WHAT CAN I DO ON THE OUTSIDE OF MY HOME?  Regularly repair the edges of walkways and driveways and fix any cracks.  Remove high doorway thresholds.  Trim any shrubbery on the main path into your home.  Use bright outdoor lighting.  Clear walkways of debris and clutter, including tools and rocks.  Regularly check that handrails are securely fastened and in good repair. Both sides of any steps should have handrails.  Install guardrails along the edges of any raised decks or porches.  Have leaves, snow, and ice cleared regularly.  Use sand or salt on walkways during winter months.  In the garage, clean up any spills right away, including grease or oil spills. WHAT CAN I DO IN THE BATHROOM?  Use night lights.  Install grab bars by the toilet and in the tub and shower. Do not use towel bars as grab bars.  Use non-skid mats or decals on the floor of the tub or shower.  If you need to sit down while you are in the shower, use a plastic, non-slip stool..  Keep the floor dry. Immediately clean up any water that spills on the floor.  Remove soap buildup in the tub or shower on a regular basis.  Attach bath mats securely with double-sided non-slip rug tape.  Remove throw rugs and other tripping hazards from the floor. WHAT CAN I DO IN THE BEDROOM?  Use night lights.  Make sure that a bedside light is easy to reach.  Do not use oversized bedding that drapes onto the floor.  Have a firm chair that has side arms to use for getting dressed.  Remove throw rugs and other tripping hazards from the floor. WHAT CAN I DO IN THE KITCHEN?   Clean up any spills right away.  Avoid walking on wet floors.  Place frequently used items in easy-to-reach places.  If you need to reach for something  above you, use a sturdy step stool that has a grab bar.  Keep electrical cables out of the way.  Do not use floor polish or wax that makes floors slippery. If you have to use wax, make sure that it is non-skid floor wax.  Remove throw rugs and other tripping hazards from the floor. WHAT CAN I DO IN THE STAIRWAYS?  Do not leave any items on the stairs.  Make sure that there are handrails on both sides of the stairs. Fix handrails that are broken or loose. Make sure that handrails are as long as the stairways.  Check any carpeting to make sure that it is firmly attached to the stairs. Fix any carpet that is loose or worn.  Avoid having throw rugs at the top or bottom of stairways, or secure the rugs with carpet tape to prevent them from moving.  Make sure that you have a light switch at the top of the stairs and the bottom of the stairs. If you do not have them, have them installed. WHAT ARE SOME OTHER FALL PREVENTION TIPS?  Wear closed-toe shoes that fit well and support your feet. Wear shoes that have rubber soles or low heels.  When you use a stepladder, make sure that it is completely opened and that the sides are firmly locked. Have someone hold the ladder while you   are using it. Do not climb a closed stepladder.  Add color or contrast paint or tape to grab bars and handrails in your home. Place contrasting color strips on the first and last steps.  Use mobility aids as needed, such as canes, walkers, scooters, and crutches.  Turn on lights if it is dark. Replace any light bulbs that burn out.  Set up furniture so that there are clear paths. Keep the furniture in the same spot.  Fix any uneven floor surfaces.  Choose a carpet design that does not hide the edge of steps of a stairway.  Be aware of any and all pets.  Review your medicines with your healthcare provider. Some medicines can cause dizziness or changes in blood pressure, which increase your risk of falling. Talk  with your health care provider about other ways that you can decrease your risk of falls. This may include working with a physical therapist or trainer to improve your strength, balance, and endurance.   This information is not intended to replace advice given to you by your health care provider. Make sure you discuss any questions you have with your health care provider.   Document Released: 10/24/2002 Document Revised: 03/20/2015 Document Reviewed: 12/08/2014 Elsevier Interactive Patient Education 2016 Elsevier Inc.  

## 2016-05-29 ENCOUNTER — Telehealth: Payer: Self-pay | Admitting: Neurology

## 2016-05-29 DIAGNOSIS — R202 Paresthesia of skin: Secondary | ICD-10-CM

## 2016-05-29 LAB — MULTIPLE MYELOMA PANEL, SERUM
ALBUMIN SERPL ELPH-MCNC: 3.8 g/dL (ref 2.9–4.4)
ALPHA2 GLOB SERPL ELPH-MCNC: 0.5 g/dL (ref 0.4–1.0)
Albumin/Glob SerPl: 1.5 (ref 0.7–1.7)
Alpha 1: 0.2 g/dL (ref 0.0–0.4)
B-Globulin SerPl Elph-Mcnc: 0.8 g/dL (ref 0.7–1.3)
Gamma Glob SerPl Elph-Mcnc: 1.1 g/dL (ref 0.4–1.8)
Globulin, Total: 2.7 g/dL (ref 2.2–3.9)
IGG (IMMUNOGLOBIN G), SERUM: 901 mg/dL (ref 700–1600)
IGM (IMMUNOGLOBULIN M), SRM: 422 mg/dL — AB (ref 15–143)
IgA/Immunoglobulin A, Serum: 110 mg/dL (ref 61–437)
TOTAL PROTEIN: 6.5 g/dL (ref 6.0–8.5)

## 2016-05-29 LAB — VITAMIN B12: Vitamin B-12: 842 pg/mL (ref 211–946)

## 2016-05-29 LAB — ANGIOTENSIN CONVERTING ENZYME: ANGIO CONVERT ENZYME: 23 U/L (ref 14–82)

## 2016-05-29 LAB — B. BURGDORFI ANTIBODIES: Lyme IgG/IgM Ab: <0.91 {ISR} (ref 0.00–0.90)

## 2016-05-29 LAB — ANA W/REFLEX: Anti Nuclear Antibody(ANA): NEGATIVE

## 2016-05-29 LAB — RHEUMATOID FACTOR: RHEUMATOID FACTOR: 89.7 [IU]/mL — AB (ref 0.0–13.9)

## 2016-05-29 LAB — RPR: RPR: NONREACTIVE

## 2016-05-29 NOTE — Telephone Encounter (Signed)
I called the patient. The blood work is unremarkable with exception that the rheumatoid factor is elevated. The clinical significance of this is not clear. We will check a sedimentation rate and C-reactive protein when he is seen in the office again for the EMG study.

## 2016-05-30 NOTE — Telephone Encounter (Signed)
Patient returned Dr. Tobey Grim call. Please call [phone].

## 2016-05-31 ENCOUNTER — Telehealth: Payer: Self-pay

## 2016-05-31 NOTE — Telephone Encounter (Signed)
Patient is on the list for [employer] 2017 and may be a good candidate for an AWV in 2017. Please let me know if/when appt is scheduled.   

## 2016-06-02 NOTE — Addendum Note (Signed)
Addended by: Monte Fantasia on: 06/02/2016 12:19 PM   Modules accepted: Orders

## 2016-06-02 NOTE — Telephone Encounter (Signed)
Returned pt TC and reviewed lab results below. Pt would like to come in on Thursday this week as he will be in the area. Lab orders entered. Will keep EMG scheduled in August unless sooner appt becomes available.

## 2016-06-02 NOTE — Telephone Encounter (Signed)
Pt called back. °

## 2016-06-04 NOTE — Telephone Encounter (Signed)
I will keep an eye out for an earlier appt for this patient.

## 2016-06-05 ENCOUNTER — Other Ambulatory Visit (INDEPENDENT_AMBULATORY_CARE_PROVIDER_SITE_OTHER): Payer: Self-pay

## 2016-06-05 ENCOUNTER — Telehealth: Payer: Self-pay | Admitting: Neurology

## 2016-06-05 ENCOUNTER — Telehealth: Payer: Self-pay | Admitting: Family Medicine

## 2016-06-05 ENCOUNTER — Other Ambulatory Visit: Payer: Self-pay

## 2016-06-05 ENCOUNTER — Ambulatory Visit (INDEPENDENT_AMBULATORY_CARE_PROVIDER_SITE_OTHER): Payer: Medicare Other | Admitting: *Deleted

## 2016-06-05 DIAGNOSIS — I480 Paroxysmal atrial fibrillation: Secondary | ICD-10-CM | POA: Diagnosis not present

## 2016-06-05 DIAGNOSIS — I4891 Unspecified atrial fibrillation: Secondary | ICD-10-CM

## 2016-06-05 DIAGNOSIS — Z0289 Encounter for other administrative examinations: Secondary | ICD-10-CM

## 2016-06-05 DIAGNOSIS — Z7901 Long term (current) use of anticoagulants: Secondary | ICD-10-CM

## 2016-06-05 DIAGNOSIS — Z5181 Encounter for therapeutic drug level monitoring: Secondary | ICD-10-CM | POA: Diagnosis not present

## 2016-06-05 DIAGNOSIS — R269 Unspecified abnormalities of gait and mobility: Secondary | ICD-10-CM

## 2016-06-05 DIAGNOSIS — R202 Paresthesia of skin: Secondary | ICD-10-CM

## 2016-06-05 LAB — POCT INR: INR: 2.4

## 2016-06-05 NOTE — Telephone Encounter (Signed)
I called the daughter. I left a message, we will try to get the EMG study moved up, not sure why they are booking out the studies so far in advance, my EMG slots are not being filled on a daily basis.

## 2016-06-05 NOTE — Telephone Encounter (Signed)
Unfortunately since I have not seen him since 2014 I cannot document need or order services that will get paid for. Please let them know they will need an appt so I can evaluate and document need for insurance purposes.

## 2016-06-05 NOTE — Telephone Encounter (Signed)
Called the daughter informed of PCP instructions.   The daughter will call back/discuss with his neurologist to see if they would be willing to do this, otherwise if they say no then she will call back to schedule face to face with PCP.

## 2016-06-05 NOTE — Telephone Encounter (Signed)
Caller name:Blair,Susan Relation to pt: daughter  Call back number: [phone] (8:3a-5:3p work)  / [phone] mobile    Reason for call:  Daughter states [facility] is scheduled for 07/09/2016 in need of clinincal advice regarding what to do in the mean time. Daughter states patient gait is off and suggested nursing assistance during the day. Please advise

## 2016-06-05 NOTE — Telephone Encounter (Signed)
Advise  On this request.  I checked and looks like you have not seen this patient since 05/12/2013

## 2016-06-05 NOTE — Telephone Encounter (Signed)
Pt's daughter, Hilaria Ota, called this morning to see if his ncv/emg could be moved up. There are not any openings at this time to do so. She is concerned for her father's safety as he has fallen several more times since his office visit here. She wants to know if some time of home health could be ordered for him. Please call her at work or on her cell phone.

## 2016-06-06 ENCOUNTER — Ambulatory Visit: Payer: Self-pay | Admitting: Neurology

## 2016-06-06 LAB — C-REACTIVE PROTEIN: CRP: 4.1 mg/L (ref 0.0–4.9)

## 2016-06-06 LAB — SEDIMENTATION RATE: Sed Rate: 2 mm/hr (ref 0–30)

## 2016-06-06 NOTE — Telephone Encounter (Signed)
LVM advising patient of message below °

## 2016-06-10 NOTE — Telephone Encounter (Signed)
Okay to use a 12 noon work in [place]

## 2016-06-10 NOTE — Telephone Encounter (Signed)
"  This patient has EMG and nerve conduction study that was scheduled on August 23, he will need to have a study done next week, please have the study moved up, may use any slot; revisit or new patient. Thank you. " Per your note to use any slot, I have been looking.  The only option I see at this time is Monday 7/31 if you would want to open your schedule to do a 430 EMG.  Please advise and I will call to move the NCV/EMG up.  Thank you!

## 2016-06-11 NOTE — Telephone Encounter (Signed)
Update-Pt is now scheduled for 06/25/16.

## 2016-06-14 ENCOUNTER — Other Ambulatory Visit: Payer: Self-pay | Admitting: Orthopaedic Surgery

## 2016-06-14 DIAGNOSIS — R102 Pelvic and perineal pain: Secondary | ICD-10-CM

## 2016-06-20 ENCOUNTER — Other Ambulatory Visit: Payer: Self-pay | Admitting: *Deleted

## 2016-06-20 ENCOUNTER — Other Ambulatory Visit: Payer: Self-pay

## 2016-06-20 DIAGNOSIS — M79606 Pain in leg, unspecified: Secondary | ICD-10-CM

## 2016-06-20 MED ORDER — METOPROLOL SUCCINATE ER 50 MG PO TB24: 50.0000 mg | ORAL_TABLET | Freq: Every day | ORAL | 3 refills | Status: DC

## 2016-06-22 ENCOUNTER — Ambulatory Visit
Admission: RE | Admit: 2016-06-22 | Discharge: 2016-06-22 | Disposition: A | Discharge status: Home / Self Care | Payer: Medicare Other | Source: Ambulatory Visit | Attending: Orthopaedic Surgery | Admitting: Orthopaedic Surgery

## 2016-06-22 DIAGNOSIS — R102 Pelvic and perineal pain: Secondary | ICD-10-CM

## 2016-06-23 ENCOUNTER — Other Ambulatory Visit: Payer: Self-pay | Admitting: *Deleted

## 2016-06-23 MED ORDER — ROSUVASTATIN CALCIUM 5 MG PO TABS: 5.0000 mg | ORAL_TABLET | Freq: Every day | ORAL | 3 refills | Status: DC

## 2016-06-25 ENCOUNTER — Encounter: Payer: Self-pay | Admitting: Neurology

## 2016-06-25 ENCOUNTER — Ambulatory Visit (INDEPENDENT_AMBULATORY_CARE_PROVIDER_SITE_OTHER): Payer: Medicare Other | Admitting: Neurology

## 2016-06-25 ENCOUNTER — Ambulatory Visit (INDEPENDENT_AMBULATORY_CARE_PROVIDER_SITE_OTHER): Payer: Self-pay | Admitting: Neurology

## 2016-06-25 DIAGNOSIS — G5601 Carpal tunnel syndrome, right upper limb: Secondary | ICD-10-CM

## 2016-06-25 DIAGNOSIS — R202 Paresthesia of skin: Secondary | ICD-10-CM

## 2016-06-25 DIAGNOSIS — R269 Unspecified abnormalities of gait and mobility: Secondary | ICD-10-CM

## 2016-06-25 DIAGNOSIS — G5600 Carpal tunnel syndrome, unspecified upper limb: Secondary | ICD-10-CM

## 2016-06-25 DIAGNOSIS — G609 Hereditary and idiopathic neuropathy, unspecified: Secondary | ICD-10-CM | POA: Insufficient documentation

## 2016-06-25 HISTORY — DX: Carpal tunnel syndrome, unspecified upper limb: G56.00

## 2016-06-25 HISTORY — DX: Hereditary and idiopathic neuropathy, unspecified: G60.9

## 2016-06-25 NOTE — Progress Notes (Signed)
[name] [name] returns today for EMG and nerve conduction study evaluation. He has had at least 4 falls since last seen. He has sustained some fractures of the right hip and left sacrum. The patient does have evidence of a significant primarily axonal peripheral neuropathy, evidence of a right carpal tunnel syndrome.  Clinical examination shows well-maintained if not slightly brisk ankle jerk reflexes bilaterally. Blood work as shown a positive rheumatoid factor, sedimentation rate and C-reactive protein levels have been normal. The clinical significance of this is not clear.  Given the rapid decline in gait, the presence of ankle jerk reflexes along with a peripheral neuropathy, the patient will be evaluated for possible cervical myelopathy overlying the peripheral neuropathy.  The patient will be sent for MRI of the cervical spine. He is getting physical therapy in the home environment. He will follow-up in 4 months.

## 2016-06-25 NOTE — Procedures (Signed)
     HISTORY:  [name] [name] is an 82 year old gentleman with a history of numbness in the legs that was present 10 years ago, but it has significantly worsened over the last several months. His walking has declined, he has had at least 4 falls within the last 6 weeks. The patient has sustained fractures of the sacrum and hip. The patient is being evaluated for a possible peripheral neuropathy. He also reports paresthesias involving the hands.   NERVE CONDUCTION STUDIES:  Nerve conduction studies were performed on the right upper extremity. The distal motor latency for the right median nerve was prolonged, with a low motor amplitude. The distal motor latency and motor amplitude for the right ulnar nerve was normal. The F wave latencies for the right median and ulnar nerves were prolonged with normal nerve conduction velocities seen for these nerves. The sensory latency for the right median nerve was unobtainable, normal for the right ulnar nerve.  Nerve conduction studies were performed on both lower extremities. The study of the peroneal nerves were unobtainable bilaterally, and the study of the right posterior tibial nerve was unobtainable. The left posterior tibial nerve revealed a normal distal motor latency with a low motor amplitude. Nerve conduction velocity for this nerve was normal. The H reflex latencies were unobtainable bilaterally. The peroneal sensory latencies were unobtainable bilaterally.  EMG STUDIES:  EMG study was performed on the left lower extremity:  The tibialis anterior muscle reveals 2 to 5K motor units with decreased recruitment. No fibrillations or positive waves were seen. The peroneus tertius muscle reveals 2 to 4K motor units with decreased recruitment. No fibrillations or positive waves were seen. The medial gastrocnemius muscle reveals 1 to 4K motor units with decreased recruitment. 1+ positive waves were seen. The vastus lateralis muscle reveals 2 to 4K motor  units with full recruitment. No fibrillations or positive waves were seen. The iliopsoas muscle reveals 2 to 4K motor units with full recruitment. No fibrillations or positive waves were seen. The biceps femoris muscle (long head) reveals 2 to 4K motor units with full recruitment. No fibrillations or positive waves were seen. The lumbosacral paraspinal muscles were tested at 3 levels, and revealed no abnormalities of insertional activity at all 3 levels tested. There was good relaxation.   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 There appears to be an overlying right carpal tunnel syndrome of moderate severity. EMG evaluation of the left lower extremity shows distal chronic and mild acute signs of denervation consistent with the diagnosis of peripheral neuropathy. There is no evidence of an overlying lumbosacral radiculopathy.  Jill Alexanders MD 06/25/2016 9:56 AM  [facility] [address] [place], [place] [address]  Phone [phone] Fax [phone]

## 2016-06-25 NOTE — Progress Notes (Signed)
Please refer to EMG and nerve conduction study procedure note. 

## 2016-06-30 ENCOUNTER — Telehealth: Payer: Self-pay | Admitting: Neurology

## 2016-06-30 NOTE — Telephone Encounter (Signed)
Pt called about setting up appt for MRI. Pt was advised it will be at [facility]. Pt was advised that Andee Poles is helping out in 2 departments and she is diligently working to get these sent to GI. He is requesting this to be done today or tomorrow

## 2016-07-02 NOTE — Telephone Encounter (Signed)
Spoke with the patient who requested to have his MRI at [facility]. I sent the order over and told him he could call them to schedule at his earliest convenience.

## 2016-07-02 NOTE — Telephone Encounter (Signed)
Patient called to schedule MRI, states this is the 2nd time he's called and no one has called him back. Please call [phone] or [phone]. Would like to know if order has been sent out to [facility] and if so, when order was sent ot [facility]. Please call.

## 2016-07-03 ENCOUNTER — Ambulatory Visit (INDEPENDENT_AMBULATORY_CARE_PROVIDER_SITE_OTHER): Payer: Medicare Other

## 2016-07-03 DIAGNOSIS — Z5181 Encounter for therapeutic drug level monitoring: Secondary | ICD-10-CM | POA: Diagnosis not present

## 2016-07-03 DIAGNOSIS — I4891 Unspecified atrial fibrillation: Secondary | ICD-10-CM | POA: Diagnosis not present

## 2016-07-03 DIAGNOSIS — Z7901 Long term (current) use of anticoagulants: Secondary | ICD-10-CM

## 2016-07-03 LAB — POCT INR: INR: 2.4

## 2016-07-05 ENCOUNTER — Encounter (INDEPENDENT_AMBULATORY_CARE_PROVIDER_SITE_OTHER): Payer: Self-pay

## 2016-07-06 ENCOUNTER — Ambulatory Visit
Admission: RE | Admit: 2016-07-06 | Discharge: 2016-07-06 | Disposition: A | Discharge status: Home / Self Care | Payer: Medicare Other | Source: Ambulatory Visit | Attending: Neurology | Admitting: Neurology

## 2016-07-06 ENCOUNTER — Other Ambulatory Visit: Payer: Self-pay | Admitting: Cardiology

## 2016-07-06 DIAGNOSIS — R202 Paresthesia of skin: Secondary | ICD-10-CM

## 2016-07-06 DIAGNOSIS — R269 Unspecified abnormalities of gait and mobility: Secondary | ICD-10-CM

## 2016-07-07 ENCOUNTER — Ambulatory Visit (INDEPENDENT_AMBULATORY_CARE_PROVIDER_SITE_OTHER)
Admission: RE | Admit: 2016-07-07 | Discharge: 2016-07-07 | Disposition: A | Discharge status: Home / Self Care | Payer: Medicare Other | Source: Ambulatory Visit | Attending: Surgery | Admitting: Surgery

## 2016-07-07 ENCOUNTER — Encounter: Payer: Self-pay | Admitting: Surgery

## 2016-07-07 ENCOUNTER — Ambulatory Visit (HOSPITAL_COMMUNITY)
Admission: RE | Admit: 2016-07-07 | Discharge: 2016-07-07 | Disposition: A | Discharge status: Home / Self Care | Payer: Medicare Other | Source: Ambulatory Visit | Attending: Vascular Surgery | Admitting: Vascular Surgery

## 2016-07-07 ENCOUNTER — Other Ambulatory Visit: Payer: Self-pay | Admitting: Vascular Surgery

## 2016-07-07 ENCOUNTER — Telehealth: Payer: Self-pay | Admitting: Neurology

## 2016-07-07 DIAGNOSIS — F329 Major depressive disorder, single episode, unspecified: Secondary | ICD-10-CM | POA: Diagnosis not present

## 2016-07-07 DIAGNOSIS — M79606 Pain in leg, unspecified: Secondary | ICD-10-CM

## 2016-07-07 DIAGNOSIS — I8393 Asymptomatic varicose veins of bilateral lower extremities: Secondary | ICD-10-CM | POA: Insufficient documentation

## 2016-07-07 DIAGNOSIS — I10 Essential (primary) hypertension: Secondary | ICD-10-CM | POA: Diagnosis not present

## 2016-07-07 DIAGNOSIS — R0989 Other specified symptoms and signs involving the circulatory and respiratory systems: Secondary | ICD-10-CM | POA: Diagnosis present

## 2016-07-07 DIAGNOSIS — E7800 Pure hypercholesterolemia, unspecified: Secondary | ICD-10-CM | POA: Diagnosis not present

## 2016-07-07 NOTE — Telephone Encounter (Signed)
  I called the patient. The MRI did not show any evidence of spinal cord compression. The patient is in physical therapy at this time. He is on gabapentin for the neuropathy dysesthesias. He is concerned about the numbness, I indicated that the complications and other therapy will not help the numbness associated with the peripheral neuropathy.  MRI cervical 07/07/16:  IMPRESSION:  This MRI of the cervical spine without contrast shows: 1.    There is multilevel degenerative changes as detailed above at C3-C4, C4-C5, C5-C6 and C6-C7. There does not appear to be any nerve root compression. 2.    The central canal is not narrowed and there are no spinal cord signal changes. 3.    No acute findings.

## 2016-07-09 ENCOUNTER — Ambulatory Visit (INDEPENDENT_AMBULATORY_CARE_PROVIDER_SITE_OTHER): Payer: Medicare Other | Admitting: Surgery

## 2016-07-09 ENCOUNTER — Encounter: Payer: Self-pay | Admitting: Surgery

## 2016-07-09 ENCOUNTER — Encounter: Payer: Medicare Other | Admitting: Neurology

## 2016-07-09 VITALS — BP 133/78 | HR 64 | Temp 97.1°F | Resp 18 | Ht 77.0 in | Wt 188.0 lb

## 2016-07-09 DIAGNOSIS — I83893 Varicose veins of bilateral lower extremities with other complications: Secondary | ICD-10-CM | POA: Diagnosis not present

## 2016-07-09 NOTE — Progress Notes (Signed)
[facility] [place]  Patient name: [name] [name] MRN: [id number] DOB: [birth year] Sex: male  REFERRING PHYSICIAN: Dr. Durward Fortes  REASON FOR CONSULT: Leg pain  HPI: [name] [name] is a 82 y.o. male, who is who is referred today for evaluation of leg pain.  The patient has recently been diagnosed with peripheral neuropathy based on testing by Dr. Jannifer Franklin.  He has numbness and tingling in the hands and feet.  He is here today because he complains of swelling in his legs that has been going on for quite some time.  He denies a history of DVT.  He denies any open wounds.  The patient suffers from hypertension which is medically managed.  He is on a statin for hypercholesterolemia.  He is a nonsmoker.  Does take Coumadin for atrial fibrillation.  He recently fell and bruised his left arm and shoulder  Past Medical History:  Diagnosis Date  . Abnormality of gait 05/27/2016  . Adenomatous polyps   . Carpal tunnel syndrome 06/25/2016   Right  . Depressive disorder, not elsewhere classified   . Hereditary and idiopathic peripheral neuropathy 06/25/2016  . Hypertension   . Internal nasal lesion 05/15/2013  . Melanoma ([place])    Left Shoulder  . Mitral regurgitation   . MVP (mitral valve prolapse)    a. With severe MR s/p Complex valvuloplasty including artificial Gore-tex neochord placement x4, chordal transposition x1, chordal release x1, # 32 mm Sorin Memo 3D Ring Annuloplasty 2012.  Marland Kitchen Neuropathy ([place])   . Normal coronary arteries    a. Normal coronary anatomy by cath 2012.  . Osteoarthritis    Knees  . PAF (paroxysmal atrial fibrillation) ([place])    a. Post-op MVR 2012.  Marland Kitchen Personal history of colonic polyps   . Prostate cancer ([place])   . Pulmonary HTN ([place])    a. Mild-mod by cath 2012.  . Pure hypercholesterolemia   . PVC (premature ventricular contraction)   . Thrombocytopenia ([place])   . Vision abnormalities    Cornea scarring     Family History  Problem Relation Age of Onset  . Clotting disorder Brother     CVA's  . Arthritis Mother   . Hypertension Mother   . Stroke Mother   . Hypertension Father   . Psychosis Father     psychiatric care  . Colon cancer Neg Hx   . Stomach cancer Neg Hx   . Heart attack Neg Hx     SOCIAL HISTORY: Social History   Social History  . Marital status: Widowed    Spouse name: N/A  . Number of children: 2  . Years of education: 29   Occupational History  . Not on file.   Social History Main Topics  . Smoking status: Never Smoker  . Smokeless tobacco: Never Used  . Alcohol use No     Comment: Last drink in 2000  . Drug use: No  . Sexual activity: Not on file   Other Topics Concern  . Not on file   Social History Narrative   Retired - Optometrist   Widower   2 children   Drinks 1 cup of coffee per day    No Known Allergies  Current Outpatient Prescriptions  Medication Sig Dispense Refill  . acetaminophen (TYLENOL) 325 MG tablet Take 325 mg by mouth 2 (two) times daily. For pain    . amiodarone (PACERONE) 200 MG tablet Take 1 tablet (200 mg total) by mouth daily. [place]  tablet 9  . diltiazem (CARDIZEM CD) 300 MG 24 hr capsule Take 1 capsule (300 mg total) by mouth daily. 90 capsule 3  . docusate sodium (COLACE) 100 MG capsule Take 200 mg by mouth daily. 2  DAILY    . furosemide (LASIX) 40 MG tablet Take 1 tablet (40 mg total) by mouth every other day. 180 tablet 3  . gabapentin (NEURONTIN) 300 MG capsule   6  . Glucosamine-Chondroitin (GLUCOSAMINE CHONDR COMPLEX PO) Take 1 capsule by mouth 2 (two) times daily.     Marland Kitchen lactose free nutrition (BOOST) LIQD Take 1 Container by mouth daily.    . metoprolol succinate (TOPROL-XL) 50 MG 24 hr tablet Take 1 tablet (50 mg total) by mouth daily. Take with or immediately following a meal. 90 tablet 3  . Multiple Vitamins-Minerals (MULTIVITAMINS THER. W/MINERALS) TABS Take 1 tablet by mouth daily. 30 each   . polyethylene  glycol (MIRALAX / GLYCOLAX) packet Take 17 g by mouth as needed.    . potassium chloride SA (K-DUR,KLOR-CON) 20 MEQ tablet Take 2 tablets (40 mEq total) by mouth daily. 180 tablet 3  . Psyllium (METAMUCIL FIBER PO) Take by mouth.    . RESTASIS 0.05 % ophthalmic emulsion Place 1 drop into both eyes every 12 (twelve) hours.     . rosuvastatin (CRESTOR) 5 MG tablet Take 1 tablet (5 mg total) by mouth daily. 90 tablet 3  . traMADol (ULTRAM) 50 MG tablet Take 50 mg by mouth as needed for moderate pain.     . traZODone (DESYREL) 100 MG tablet Take 300 mg by mouth at bedtime.     . vitamin C (ASCORBIC ACID) 500 MG tablet Take 500 mg by mouth 2 (two) times daily.    Marland Kitchen warfarin (COUMADIN) 5 MG tablet Take 1 tablet (5 mg total) by mouth daily. Take by mouth daily as directed by the coumadin clinic 25 tablet 3  . mirabegron ER (MYRBETRIQ) 25 MG TB24 tablet Take 25 mg by mouth daily.     No current facility-administered medications for this visit.     REVIEW OF SYSTEMS:  [X]  denotes positive finding, [ ]  denotes negative finding Cardiac  Comments:  Chest pain or chest pressure:    Shortness of breath upon exertion:    Short of breath when lying flat:    Irregular heart rhythm: x       Vascular    Pain in calf, thigh, or hip brought on by ambulation:    Pain in feet at night that wakes you up from your sleep:     Blood clot in your veins:    Leg swelling:  x       Pulmonary    Oxygen at home:    Productive cough:     Wheezing:         Neurologic    Sudden weakness in arms or legs:     Sudden numbness in arms or legs:     Sudden onset of difficulty speaking or slurred speech:    Temporary loss of vision in one eye:     Problems with dizziness:         Gastrointestinal    Blood in stool:     Vomited blood:         Genitourinary    Burning when urinating:     Blood in urine:        Psychiatric    Major depression:  Hematologic    Bleeding problems:    Problems with blood  clotting too easily:        Skin    Rashes or ulcers:        Constitutional    Fever or chills:      PHYSICAL EXAM: Vitals:   07/09/16 1457  BP: 133/78  Pulse: 64  Resp: 18  Temp: 97.1 F (36.2 C)  SpO2: 97%  Weight: 188 lb (85.3 kg)  Height: 6\' 5"  (1.956 m)    GENERAL: The patient is a well-nourished male, in no acute distress. The vital signs are documented above. CARDIAC: There is a regular rate and rhythm.  VASCULAR: Palpable pedal pulses.  2+ edema on the right leg and 1+ on the left.   PULMONARY: There is good air exchange bilaterally without wheezing or rales. ABDOMEN: Soft and non-tender with normal pitched bowel sounds.  MUSCULOSKELETAL: There are no major deformities or cyanosis. NEUROLOGIC: No focal weakness or paresthesias are detected. SKIN: There are no ulcers or rashes noted.Hyperpigmentation in the left foot and ankle as well as the right  PSYCHIATRIC: The patient has a normal affect.  DATA:  Vascular lab studies were ordered.  ABI on the right is 1.2 on the right and 1.2 on the left, all have triphasic waveforms.    Venous insufficiency ultrasound was performed.  This shows reflux and bilateral great saphenous vein.  Maximum diameter is 0.81 on the right and 0.74 on the left  ASSESSMENT AND PLAN: Varicose veins with complications: The patient's biggest complaint today is that of leg swelling.  Therefore, I have recommended getting him fitted into thigh-high 20-30 compression stockings to see if this helps alleviate some of the discomfort he is having in his legs.  He will be fitted at our office today for stockings.  He will keep his legs elevated when possible.  He can take ibuprofen for discomfort.  I have him scheduled for follow-up in 3 months for discussions of endovenous laser ablation.  Annamarie Major, MD [facility] [phone] Pager [phone]

## 2016-07-31 ENCOUNTER — Ambulatory Visit (INDEPENDENT_AMBULATORY_CARE_PROVIDER_SITE_OTHER): Payer: Medicare Other | Admitting: *Deleted

## 2016-07-31 DIAGNOSIS — Z5181 Encounter for therapeutic drug level monitoring: Secondary | ICD-10-CM | POA: Diagnosis not present

## 2016-07-31 DIAGNOSIS — Z7901 Long term (current) use of anticoagulants: Secondary | ICD-10-CM

## 2016-07-31 DIAGNOSIS — I4891 Unspecified atrial fibrillation: Secondary | ICD-10-CM

## 2016-07-31 LAB — POCT INR: INR: 2.4

## 2016-08-04 ENCOUNTER — Other Ambulatory Visit: Payer: Self-pay | Admitting: Interventional Cardiology

## 2016-08-07 ENCOUNTER — Encounter: Payer: Self-pay | Admitting: Physician Assistant

## 2016-08-25 ENCOUNTER — Ambulatory Visit (INDEPENDENT_AMBULATORY_CARE_PROVIDER_SITE_OTHER): Payer: Medicare Other | Admitting: Physician Assistant

## 2016-08-25 ENCOUNTER — Ambulatory Visit: Payer: Medicare Other | Admitting: Physician Assistant

## 2016-08-25 ENCOUNTER — Encounter: Payer: Self-pay | Admitting: Physician Assistant

## 2016-08-25 ENCOUNTER — Ambulatory Visit (INDEPENDENT_AMBULATORY_CARE_PROVIDER_SITE_OTHER): Payer: Medicare Other | Admitting: *Deleted

## 2016-08-25 VITALS — BP 116/60 | HR 67 | Ht 77.0 in | Wt 185.0 lb

## 2016-08-25 DIAGNOSIS — Z9889 Other specified postprocedural states: Secondary | ICD-10-CM | POA: Diagnosis not present

## 2016-08-25 DIAGNOSIS — I4891 Unspecified atrial fibrillation: Secondary | ICD-10-CM

## 2016-08-25 DIAGNOSIS — I5042 Chronic combined systolic (congestive) and diastolic (congestive) heart failure: Secondary | ICD-10-CM | POA: Diagnosis not present

## 2016-08-25 DIAGNOSIS — R609 Edema, unspecified: Secondary | ICD-10-CM | POA: Diagnosis not present

## 2016-08-25 DIAGNOSIS — Z7901 Long term (current) use of anticoagulants: Secondary | ICD-10-CM

## 2016-08-25 DIAGNOSIS — I480 Paroxysmal atrial fibrillation: Secondary | ICD-10-CM | POA: Diagnosis not present

## 2016-08-25 DIAGNOSIS — Z79899 Other long term (current) drug therapy: Secondary | ICD-10-CM | POA: Diagnosis not present

## 2016-08-25 DIAGNOSIS — Z5181 Encounter for therapeutic drug level monitoring: Secondary | ICD-10-CM

## 2016-08-25 DIAGNOSIS — I10 Essential (primary) hypertension: Secondary | ICD-10-CM

## 2016-08-25 LAB — HEPATIC FUNCTION PANEL
ALBUMIN: 4 g/dL (ref 3.6–5.1)
ALK PHOS: 36 U/L — AB (ref 40–115)
ALT: 22 U/L (ref 9–46)
AST: 20 U/L (ref 10–35)
Bilirubin, Direct: 0.2 mg/dL (ref <=0.2)
Indirect Bilirubin: 0.4 mg/dL (ref 0.2–1.2)
TOTAL PROTEIN: 6.6 g/dL (ref 6.1–8.1)
Total Bilirubin: 0.6 mg/dL (ref 0.2–1.2)

## 2016-08-25 LAB — BASIC METABOLIC PANEL
BUN: 23 mg/dL (ref 7–25)
CO2: 29 mmol/L (ref 20–31)
Calcium: 9.4 mg/dL (ref 8.6–10.3)
Chloride: 104 mmol/L (ref 98–110)
Creat: 1.06 mg/dL (ref 0.70–1.11)
GLUCOSE: 81 mg/dL (ref 65–99)
POTASSIUM: 4.8 mmol/L (ref 3.5–5.3)
SODIUM: 141 mmol/L (ref 135–146)

## 2016-08-25 LAB — POCT INR: INR: 2.3

## 2016-08-25 LAB — TSH: TSH: 0.73 m[IU]/L (ref 0.40–4.50)

## 2016-08-25 NOTE — Progress Notes (Signed)
Cardiology Office Note:    Date:  08/25/2016   ID:  [name] [name], DOB [birth year], MRN [id number]  PCP:  Penni Homans, MD  Cardiologist:  Dr. Joretta Bachelor, PA-C  Electrophysiologist:  N/a Neurologist: Dr. Jannifer Franklin VVS: Dr. Trula Slade  Referring MD: [name], MD   Chief Complaint  Patient presents with  . Follow-up    s/p MV repair, CHF, PAF    History of Present Illness:    [name] [name] is a 82 y.o. male previously followed by Dr. Mare Ferrari with a hx of PAF/flutter, mitral valve prolapse with severe mitral regurgitation status post mitral valve repair in 2012 with Dr. Roxy Manns, chronic anticoagulation with Coumadin, prostate CA, HTN, HL, chronic thrombocytopenia. He had recurrent atrial fibrillation in 1/17 with variable ventricular response. Echocardiogram at that time demonstrated reduced LV function with an EF of 45-50% and moderate diastolic dysfunction. Mitral valve repair was stable with just mild MS. He was placed on amiodarone and underwent DCCV.  He has had persistent left lower lobe rounded atelectasis based upon chest x-ray and chest CT findings.  He returns for follow-up. Here alone today. He denies dyspnea. He denies chest pain. He denies syncope. He has chronic LE edema. He has seen VVS. He wears compression stockings. Venous duplex was negative for DVT in 8/17. He denies orthopnea, PND. He denies syncope. He denies any bleeding issues. He walks with a walker due to issues with balance. He also has peripheral neuropathy.  Prior CV studies that were reviewed today include:    ABIs 07/07/16 Normal  Echo 12/27/15 Mild concentric LVH, EF 45-50%, normal wall motion, grade 2 diastolic dysfunction, trivial AI, s/p MV repair with normal function, mild mitral stenosis, mean gradient 4 mmHg, mild MR, mild LAE, moderate RVE, severe RAE, moderate TR  Event Monitor 5/15 occ AFib, predominant NSR, occ PVCs, no VT  Echo (03/2014):   Moderate LVH. EF 50%.  MV repair  okay, mild MS. Right ventricle: The cavity size was mildly dilated. Systolic function was mildly reduced. - Right atrium: The atrium was mildly dilated. - No obvious change from the prior study.  Carotid US 11/12 No sig ICA stenosis  LHC 10/12 LM normal LAD normal LCx normal RCA normal EF 60%, severe MR  Past Medical History:  Diagnosis Date  . Abnormality of gait 05/27/2016  . Adenomatous polyps   . Carpal tunnel syndrome 06/25/2016   Right  . Depressive disorder, not elsewhere classified   . Hereditary and idiopathic peripheral neuropathy 06/25/2016  . Hypertension   . Internal nasal lesion 05/15/2013  . Melanoma ([place])    Left Shoulder  . Mitral regurgitation   . MVP (mitral valve prolapse)    a. With severe MR s/p Complex valvuloplasty including artificial Gore-tex neochord placement x4, chordal transposition x1, chordal release x1, # 32 mm Sorin Memo 3D Ring Annuloplasty 2012.  Marland Kitchen Neuropathy ([place])   . Normal coronary arteries    a. Normal coronary anatomy by cath 2012.  . Osteoarthritis    Knees  . PAF (paroxysmal atrial fibrillation) ([place])    a. Post-op MVR 2012.  Marland Kitchen Personal history of colonic polyps   . Prostate cancer ([place])   . Pulmonary HTN    a. Mild-mod by cath 2012.  . Pure hypercholesterolemia   . PVC (premature ventricular contraction)   . Thrombocytopenia ([place])   . Vision abnormalities    Cornea scarring    Past Surgical History:  Procedure Laterality Date  . CARDIAC CATHETERIZATION  09/2011   Pre-op for MVR -- normal coronaries.  Marland Kitchen CARDIOVERSION N/A 01/02/2016   Procedure: CARDIOVERSION;  Surgeon: Thayer Headings, MD;  Location[facility] ENDOSCOPY;  Service: Cardiovascular;  Laterality: N/A;  . COLONOSCOPY W/ POLYPECTOMY    . INGUINAL HERNIA REPAIR  09/2009   Left  . KNEE ARTHROSCOPY      left x3  and right x2  . Melanoma Surgery     2001, 2005, 2006, 2009  . MITRAL VALVE REPAIR  10/01/2011   complex valvuloplasty with Goretex cord replacement and chordal  transposition 55mm Sorin Memo 3D ring annuloplasty  . Nuclear Stress Test  09/2006   EF-64%, Normal  . PROSTATECTOMY  1993  . ROOT CANAL  08-19-12  . ROTATOR CUFF REPAIR  2003   left  . TEE WITHOUT CARDIOVERSION  09/26/2011   Procedure: TRANSESOPHAGEAL ECHOCARDIOGRAM (TEE);  Surgeon: Lelon Perla, MD;  Location: [facility] ENDOSCOPY;  Service: Cardiovascular;  Laterality: N/A;  . US ECHOCARDIOGRAPHY  09/2009, 08/1011   mild LVH,mild AI,MVP with mild MR, mild-mod. TR with mild Pulm. HTN, EF-55-60%    Current Medications: Current Meds  Medication Sig  . acetaminophen (TYLENOL) 325 MG tablet Take 325 mg by mouth 2 (two) times daily. For pain  . amiodarone (PACERONE) 200 MG tablet Take 1 tablet (200 mg total) by mouth daily.  Marland Kitchen diltiazem (CARDIZEM CD) 300 MG 24 hr capsule Take 1 capsule (300 mg total) by mouth daily.  Marland Kitchen docusate sodium (COLACE) 100 MG capsule Take 200 mg by mouth daily. 2  DAILY  . furosemide (LASIX) 40 MG tablet Take 1 tablet (40 mg total) by mouth every other day.  . gabapentin (NEURONTIN) 300 MG capsule Take 300 mg by mouth 2 (two) times daily.   . Glucosamine-Chondroitin (GLUCOSAMINE CHONDR COMPLEX PO) Take 1 capsule by mouth 2 (two) times daily.   Marland Kitchen lactose free nutrition (BOOST) LIQD Take 1 Container by mouth daily.  . metoprolol succinate (TOPROL-XL) 50 MG 24 hr tablet Take 1 tablet (50 mg total) by mouth daily. Take with or immediately following a meal.  . Multiple Vitamins-Minerals (MULTIVITAMINS THER. W/MINERALS) TABS Take 1 tablet by mouth daily.  . polyethylene glycol (MIRALAX / GLYCOLAX) packet Take 17 g by mouth as needed.  . potassium chloride SA (K-DUR,KLOR-CON) 20 MEQ tablet Take 2 tablets (40 mEq total) by mouth daily.  . Psyllium (METAMUCIL FIBER PO) Take by mouth.  . RESTASIS 0.05 % ophthalmic emulsion Place 1 drop into both eyes every 12 (twelve) hours.   . rosuvastatin (CRESTOR) 5 MG tablet Take 1 tablet (5 mg total) by mouth daily.  . traMADol (ULTRAM) 50  MG tablet Take 50 mg by mouth as needed for moderate pain.   . traZODone (DESYREL) 100 MG tablet Take 300 mg by mouth at bedtime.   . vitamin C (ASCORBIC ACID) 500 MG tablet Take 500 mg by mouth 2 (two) times daily.  Marland Kitchen warfarin (COUMADIN) 5 MG tablet Take 1 tablet (5 mg total) by mouth daily. Take by mouth daily as directed by the coumadin clinic     Allergies:   Review of patient's allergies indicates no known allergies.   Social History   Social History  . Marital status: Widowed    Spouse name: N/A  . Number of children: 2  . Years of education: 68   Social History Main Topics  . Smoking status: Never Smoker  . Smokeless tobacco: Never Used  . Alcohol use No     Comment: Last  drink in 2000  . Drug use: No  . Sexual activity: Not Asked   Other Topics Concern  . None   Social History Narrative   Retired Brewing technologist   Widower   2 children   Drinks 1 cup of coffee per day     Family History:  The patient's family history includes Arthritis in his mother; Clotting disorder in his brother; Hypertension in his father and mother; Psychosis in his father; Stroke in his mother.   ROS:   Please see the history of present illness.    ROS All other systems reviewed and are negative.   EKGs/Labs/Other Test Reviewed:    EKG:  EKG is  ordered today.  The ekg ordered today demonstrates NSR, HR 67, normal axis, first-degree AV block, PR 318 ms, septal Q waves, QTc 460 ms, no significant change compared to prior tracing dated 04/17/16  Recent Labs: 12/12/2015: ALT 22; TSH 1.401 12/27/2015: Hemoglobin 14.8; Platelets 85 02/13/2016: BUN 24; Creat 1.14; Potassium 4.9; Sodium 140   Recent Lipid Panel    Component Value Date/Time   CHOL 112 (L) 11/02/2015 1048   TRIG 66 11/02/2015 1048   HDL 49 11/02/2015 1048   CHOLHDL 2.3 11/02/2015 1048   VLDL 13 11/02/2015 1048   LDLCALC 50 11/02/2015 1048     Physical Exam:    VS:  BP 116/60   Pulse 67   Ht 6\' 5"  (1.956 m)   Wt 185 lb  (83.9 kg)   BMI 21.94 kg/m     Wt Readings from Last 3 Encounters:  08/25/16 185 lb (83.9 kg)  07/09/16 188 lb (85.3 kg)  07/05/16 202 lb (91.6 kg)     Physical Exam  Constitutional: He is oriented to person, place, and time. He appears well-developed and well-nourished. No distress.  HENT:  Head: Normocephalic and atraumatic.  Eyes: No scleral icterus.  Neck: No JVD present.  Cardiovascular: Normal rate, regular rhythm, S1 normal and S2 normal.   Murmur heard.  Holosystolic murmur is present with a grade of 2/6  at the apex Pulmonary/Chest: Effort normal. He has no wheezes. He has no rales.  Abdominal: Soft. There is no tenderness.  Musculoskeletal: He exhibits edema.  1+ bilateral, mainly non-pitting, LE edema  Neurological: He is alert and oriented to person, place, and time.  Skin: Skin is warm and dry.  Psychiatric: He has a normal mood and affect.    ASSESSMENT:    1. S/P mitral valve repair   2. PAF (paroxysmal atrial fibrillation) ([place])   3. Chronic combined systolic and diastolic CHF (congestive heart failure) ([place])   4. Peripheral edema   5. Essential hypertension, benign   6. On amiodarone therapy    PLAN:    In order of problems listed above:  1. S/p MV Repair - Normally functioning mitral valve repair by echocardiogram in 2/17. His ejection fraction was down at the time of his last echocardiogram. This was in the setting of atrial fibrillation. His exam does not suggest worsening mitral regurgitation. I will arrange follow-up echocardiogram prior to his next visit in 12/2016. Continue SBE prophylaxis.  2. PAF - He is maintaining normal sinus rhythm on amiodarone. He does have a first-degree AV block. His PR has gotten s/w longer over time.  Will need ECG at next OV.  If PR is longer, will need to reduce dose of AV nodal blocking agents or DC one of them (Diltiazem or Metoprolol Succinate).  Continue Coumadin. No bleeding problems.  3. Chronic combined systolic  and diastolic CHF - EF was [id number] when he was in AFib earlier this year.  Volume is stable.  Continue current Rx. Check BMET today. Plan FU Echo in 12/2016.  4. Edema - Chronic venous insuff.  He sees VVS.  He usually wears compression stockings.  5. HTN - BP controlled.   6. Amiodarone Rx - No PFTs in chart.  Will get baseline PFTs with DLCO.  Check TSH, LFTs today.     Medication Adjustments/Labs and Tests Ordered: Current medicines are reviewed at length with the patient today.  Concerns regarding medicines are outlined above.  Medication changes, Labs and Tests ordered today are outlined in the Patient Instructions noted below. Patient Instructions  Medication Instructions:  Your physician recommends that you continue on your current medications as directed. Please refer to the Current Medication list given to you today.  If you need a refill on your cardiac medications before your next appointment, please call your pharmacy.  Labwork: BMET TSH AND LFT   Testing/Procedures: A WEEK BEFORE 4 MONTH FOLLOW UP Your physician has requested that you have an echocardiogram. Echocardiography is a painless test that uses sound waves to create images of your heart. It provides your doctor with information about the size and shape of your heart and how well your heart's chambers and valves are working. This procedure takes approximately one hour. There are no restrictions for this procedure.  Your physician has recommended that you have a pulmonary function test. Pulmonary Function Tests are a group of tests that measure how well air moves in and out of your lungs.  Follow-Up:  Your physician wants you to follow-up in:  [place] will receive a reminder letter in the mail two months in advance. If you don't receive a letter, please call our office to schedule the follow-up appointment.  Any Other Special Instructions Will Be Listed Below (If Applicable).  Signed, Richardson Dopp, PA-C  08/25/2016 1:18 PM    [facility] [address], [place], [place]  [address] Phone: [phone]; Fax: [phone]

## 2016-08-25 NOTE — Patient Instructions (Addendum)
Medication Instructions:  Your physician recommends that you continue on your current medications as directed. Please refer to the Current Medication list given to you today.  If you need a refill on your cardiac medications before your next appointment, please call your pharmacy.  Labwork: BMET TSH AND LFT   Testing/Procedures: A WEEK BEFORE 4 MONTH FOLLOW UP Your physician has requested that you have an echocardiogram. Echocardiography is a painless test that uses sound waves to create images of your heart. It provides your doctor with information about the size and shape of your heart and how well your heart's chambers and valves are working. This procedure takes approximately one hour. There are no restrictions for this procedure.  Your physician has recommended that you have a pulmonary function test. Pulmonary Function Tests are a group of tests that measure how well air moves in and out of your lungs.  Follow-Up:  Your physician wants you to follow-up in:  [place] will receive a reminder letter in the mail two months in advance. If you don't receive a letter, please call our office to schedule the follow-up appointment.  Any Other Special Instructions Will Be Listed Below (If Applicable).

## 2016-08-27 ENCOUNTER — Encounter (INDEPENDENT_AMBULATORY_CARE_PROVIDER_SITE_OTHER): Payer: Medicare Other | Admitting: Internal Medicine

## 2016-08-27 DIAGNOSIS — Z79899 Other long term (current) drug therapy: Secondary | ICD-10-CM

## 2016-08-27 LAB — PULMONARY FUNCTION TEST
DL/VA % PRED: 63 %
DL/VA: 3.07 ml/min/mmHg/L
DLCO COR: 17.4 ml/min/mmHg
DLCO UNC % PRED: 45 %
DLCO cor % pred: 44 %
DLCO unc: 17.78 ml/min/mmHg
FEF 25-75 PRE: 1.52 L/s
FEF 25-75 Post: 1.65 L/sec
FEF2575-%CHANGE-POST: 9 %
FEF2575-%PRED-POST: 72 %
FEF2575-%Pred-Pre: 66 %
FEV1-%CHANGE-POST: 4 %
FEV1-%PRED-PRE: 76 %
FEV1-%Pred-Post: 79 %
FEV1-Post: 2.69 L
FEV1-Pre: 2.58 L
FEV1FVC-%CHANGE-POST: 6 %
FEV1FVC-%Pred-Pre: 89 %
FEV6-%Change-Post: 0 %
FEV6-%PRED-PRE: 89 %
FEV6-%Pred-Post: 89 %
FEV6-PRE: 4.01 L
FEV6-Post: 4 L
FEV6FVC-%Change-Post: 1 %
FEV6FVC-%PRED-PRE: 104 %
FEV6FVC-%Pred-Post: 106 %
FVC-%Change-Post: -1 %
FVC-%PRED-POST: 84 %
FVC-%PRED-PRE: 86 %
FVC-POST: 4.01 L
FVC-PRE: 4.09 L
POST FEV1/FVC RATIO: 67 %
POST FEV6/FVC RATIO: 100 %
Pre FEV1/FVC ratio: 63 %
Pre FEV6/FVC Ratio: 98 %
RV % PRED: 33 %
RV: 0.99 L
TLC % pred: 92 %
TLC: 7.48 L

## 2016-09-01 ENCOUNTER — Telehealth: Payer: Self-pay | Admitting: *Deleted

## 2016-09-01 DIAGNOSIS — I4891 Unspecified atrial fibrillation: Secondary | ICD-10-CM

## 2016-09-01 NOTE — Telephone Encounter (Signed)
Pt has been notified of PFT results and findings by phone with verbal understanding. Pt is agreeable to referral to [facility] for further assessment of PFT's. Per Richardson Dopp, PA and Dr. Irish Lack to refer pt to Pulmonary to assess specifically to make sure that it is ok to keep pt on Amiodarone. Referral placed in Epic today.

## 2016-09-11 ENCOUNTER — Ambulatory Visit (INDEPENDENT_AMBULATORY_CARE_PROVIDER_SITE_OTHER): Payer: Medicare Other | Admitting: Orthopedic Surgery

## 2016-09-11 ENCOUNTER — Ambulatory Visit (INDEPENDENT_AMBULATORY_CARE_PROVIDER_SITE_OTHER): Payer: Medicare Other

## 2016-09-11 ENCOUNTER — Encounter (INDEPENDENT_AMBULATORY_CARE_PROVIDER_SITE_OTHER): Payer: Self-pay | Admitting: Orthopedic Surgery

## 2016-09-11 VITALS — BP 100/58 | HR 60 | Resp 12 | Ht 77.0 in | Wt 184.0 lb

## 2016-09-11 DIAGNOSIS — M25511 Pain in right shoulder: Secondary | ICD-10-CM

## 2016-09-11 NOTE — Progress Notes (Signed)
Office Visit Note   Patient: [name] [name]           Date of Birth: 12-06-1930           MRN: [id number] Visit Date: 09/11/2016              Requested by: [name], MD [address] [place], [place] [address] PCP: Penni Homans, MD   Assessment & Plan: Visit Diagnoses:  1. Acute pain of right shoulder     Plan:  #1 new activities as tolerated #2 ice to the right shoulder #3 return in 2 weeks if he is not improved  Follow-Up Instructions: Return in about 2 weeks (around 09/25/2016).   Orders:  Orders Placed This Encounter  Procedures  . XR Shoulder Right   No orders of the defined types were placed in this encounter.     Procedures: No procedures performed   Clinical Data: No additional findings.   Subjective: Chief Complaint  Patient presents with  . Right Shoulder - Injury, Edema, Pain    Pt fell 10 days ago, ambultates with walker and it went forward and he fell into a fish tank.     [name] [name] is a 82 year old white male who is seen today for evaluation of his right shoulder. Apparently he was walking in the kitchen and is walker got too far in front of him and as he did he started to fall and he placed in his right shoulder into a fish tank as he was falling. He developed pain in his right shoulder and also had decreased range of motion secondary to that. He comes in today for evaluation to rule out possible fracture.    Review of Systems  HENT: Negative.   Respiratory: Negative.   Cardiovascular:       Hypertension  Gastrointestinal: Negative.      Objective: Vital Signs: BP (!) 100/58 (BP Location: Left Arm)   Pulse 60   Resp 12   Ht 6\' 5"  (1.956 m)   Wt 184 lb (83.5 kg)   BMI 21.82 kg/m   Physical Exam  Constitutional: He appears well-developed and well-nourished.  HENT:  Head: Normocephalic and atraumatic.  Eyes: EOM are normal. Pupils are equal, round, and reactive to light.  Neck: Neck supple.  No carotid bruits    Cardiovascular: Normal rate, regular rhythm, normal heart sounds and intact distal pulses.   Pulmonary/Chest: Effort normal.  Abdominal: Soft. Bowel sounds are normal.  Skin: Skin is warm and dry.  Psychiatric: He has a normal mood and affect. His behavior is normal. Judgment and thought content normal.    Right Shoulder Exam   Tenderness  The patient is experiencing tenderness in the acromioclavicular joint, acromion and biceps tendon.  Range of Motion  Active Abduction: 110  Passive Abduction: 120  External Rotation: 70  Internal Rotation 90 degrees: 30   Muscle Strength  Abduction: 4/5  Internal Rotation: 4/5  External Rotation: 4/5  Supraspinatus: 4/5  Subscapularis: 4/5  Biceps: 4/5   Tests  Impingement: positive  Other  Erythema: absent Sensation: normal Pulse: present      Specialty Comments:  No specialty comments available.  Imaging: Xr Shoulder Right  Result Date: 09/11/2016 Two-view x-ray of the right shoulder reveals on the AP [facility] joint changes and sclerosing. Do not see a fracture of the right shoulder at this time. Appears to have a type 1-2 acromion. Unable to get a true Y view because of  trying to fall.    PMFS History: Patient Active Problem List   Diagnosis Date Noted  . Carpal tunnel syndrome 06/25/2016  . Hereditary and idiopathic peripheral neuropathy 06/25/2016  . Paresthesia 05/27/2016  . Abnormality of gait 05/27/2016  . Chest x-ray abnormality 01/09/2016  . Paroxysmal atrial flutter ([place]) 12/05/2015  . Constipation 06/13/2015  . Encounter for therapeutic drug monitoring 05/25/2014  . Syncope 04/03/2014  . Rhabdomyolysis 04/03/2014  . UTI (urinary tract infection) 04/03/2014  . Acute renal failure ([place]) 04/03/2014  . Leukocytosis 04/03/2014  . Internal nasal lesion 05/15/2013  . Low back pain 04/19/2013  . Melanoma ([place]) 09/30/2012  . Cough 03/26/2012  . Hx of mitral valve repair 11/19/2011  . Long term (current) use of  anticoagulants 11/03/2011  . Decubitus skin ulcer 10/28/2011  . Pleural effusion due to congestive heart failure ([place]) 10/28/2011  . Atrial fibrillation ([place]) 10/07/2011  . S/P mitral valve repair 10/01/2011  . Valvular heart disease 08/21/2011  . Cerumen impaction 04/09/2011  . Hearing loss 04/09/2011  . THROMBOCYTOPENIA 09/19/2010  . ADENOCARCINOMA, PROSTATE 09/17/2010  . CALLUS, LEFT FOOT 09/17/2010  . BACK PAIN, CHRONIC 09/17/2010  . TINEA PEDIS 05/28/2009  . DERMATITIS, ATOPIC 04/10/2009  . HYPERCHOLESTEROLEMIA 06/09/2008  . DEPRESSION 06/09/2008  . HYPERTENSION, BENIGN ESSENTIAL 06/09/2008  . GERD 06/09/2008  . OSTEOARTHRITIS, GENERALIZED, MULTIPLE JOINTS 06/09/2008  . MUSCLE SPASM, BACK 06/09/2008  . PERSONAL HISTORY MALIGNANT NEOPLASM PROSTATE 06/09/2008  . PERSONAL HISTORY OF MALIGNANT MELANOMA OF SKIN 06/09/2008  . ARRHYTHMIA, HX OF 06/09/2008  . Personal history of colonic polyps 06/09/2008   Past Medical History:  Diagnosis Date  . Abnormality of gait 05/27/2016  . Adenomatous polyps   . Carpal tunnel syndrome 06/25/2016   Right  . Depressive disorder, not elsewhere classified   . Hereditary and idiopathic peripheral neuropathy 06/25/2016  . Hypertension   . Internal nasal lesion 05/15/2013  . Melanoma ([place])    Left Shoulder  . Mitral regurgitation   . MVP (mitral valve prolapse)    a. With severe MR s/p Complex valvuloplasty including artificial Gore-tex neochord placement x4, chordal transposition x1, chordal release x1, # 32 mm Sorin Memo 3D Ring Annuloplasty 2012.  Marland Kitchen Neuropathy ([place])   . Normal coronary arteries    a. Normal coronary anatomy by cath 2012.  . Osteoarthritis    Knees  . PAF (paroxysmal atrial fibrillation) ([place])    a. Post-op MVR 2012.  Marland Kitchen Personal history of colonic polyps   . Prostate cancer ([place])   . Pulmonary HTN    a. Mild-mod by cath 2012.  . Pure hypercholesterolemia   . PVC (premature ventricular contraction)   . Thrombocytopenia  ([place])   . Vision abnormalities    Cornea scarring    Family History  Problem Relation Age of Onset  . Clotting disorder Brother     CVA's  . Arthritis Mother   . Hypertension Mother   . Stroke Mother   . Hypertension Father   . Psychosis Father     psychiatric care  . Colon cancer Neg Hx   . Stomach cancer Neg Hx   . Heart attack Neg Hx     Past Surgical History:  Procedure Laterality Date  . CARDIAC CATHETERIZATION  09/2011   Pre-op for MVR -- normal coronaries.  Marland Kitchen CARDIOVERSION N/A 01/02/2016   Procedure: CARDIOVERSION;  Surgeon: Thayer Headings, MD;  Location: [facility] ENDOSCOPY;  Service: Cardiovascular;  Laterality: N/A;  . COLONOSCOPY W/ POLYPECTOMY    .  INGUINAL HERNIA REPAIR  09/2009   Left  . KNEE ARTHROSCOPY      left x3  and right x2  . Melanoma Surgery     2001, 2005, 2006, 2009  . MITRAL VALVE REPAIR  10/01/2011   complex valvuloplasty with Goretex cord replacement and chordal transposition 69mm Sorin Memo 3D ring annuloplasty  . Nuclear Stress Test  09/2006   EF-64%, Normal  . PROSTATECTOMY  1993  . ROOT CANAL  08-19-12  . ROTATOR CUFF REPAIR  2003   left  . TEE WITHOUT CARDIOVERSION  09/26/2011   Procedure: TRANSESOPHAGEAL ECHOCARDIOGRAM (TEE);  Surgeon: Lelon Perla, MD;  Location: [facility] ENDOSCOPY;  Service: Cardiovascular;  Laterality: N/A;  . US ECHOCARDIOGRAPHY  09/2009, 08/1011   mild LVH,mild AI,MVP with mild MR, mild-mod. TR with mild Pulm. HTN, EF-55-60%   Social History   Occupational History  . Not on file.   Social History Main Topics  . Smoking status: Never Smoker  . Smokeless tobacco: Never Used  . Alcohol use No     Comment: Last drink in 2000  . Drug use: No  . Sexual activity: Not on file

## 2016-09-22 ENCOUNTER — Ambulatory Visit (INDEPENDENT_AMBULATORY_CARE_PROVIDER_SITE_OTHER): Payer: Medicare Other | Admitting: *Deleted

## 2016-09-22 DIAGNOSIS — Z7901 Long term (current) use of anticoagulants: Secondary | ICD-10-CM

## 2016-09-22 DIAGNOSIS — Z5181 Encounter for therapeutic drug level monitoring: Secondary | ICD-10-CM

## 2016-09-22 DIAGNOSIS — I4891 Unspecified atrial fibrillation: Secondary | ICD-10-CM

## 2016-09-22 LAB — POCT INR: INR: 2.7

## 2016-09-24 ENCOUNTER — Ambulatory Visit (INDEPENDENT_AMBULATORY_CARE_PROVIDER_SITE_OTHER): Payer: Medicare Other | Admitting: Orthopedic Surgery

## 2016-09-25 ENCOUNTER — Institutional Professional Consult (permissible substitution): Payer: Medicare Other | Admitting: Emergency Medicine

## 2016-09-26 ENCOUNTER — Encounter: Payer: Self-pay | Admitting: Emergency Medicine

## 2016-09-26 ENCOUNTER — Ambulatory Visit (INDEPENDENT_AMBULATORY_CARE_PROVIDER_SITE_OTHER): Payer: Medicare Other | Admitting: Emergency Medicine

## 2016-09-26 DIAGNOSIS — J449 Chronic obstructive pulmonary disease, unspecified: Secondary | ICD-10-CM

## 2016-09-26 DIAGNOSIS — I4892 Unspecified atrial flutter: Secondary | ICD-10-CM

## 2016-09-26 NOTE — Assessment & Plan Note (Signed)
Obstruction noted on pulmonary function testing with an associated diffusion defect. He is asymptomatic. Etiology is unclear but he did have a secondhand smoke exposure. He does not recall any history of asthma. As he is asymptomatic I do not believe we need to start any medications at this time. I would like for him to have a walking oximetry given the diffusion defect.

## 2016-09-26 NOTE — Progress Notes (Signed)
Subjective:    Patient ID: [name] [name], male    DOB: 1931-04-10, 82 y.o.   MRN: [id number]  HPI 82 year old never smoker with a history of paroxysmal atrial fibrillation/flutter, mitral valve repair in 2012 on chronic anticoagulation, hypertension, systolic and diastolic dysfunction by TTE 11/2015. Started amiodarone 11/2015 and has been in NSR. He has a CT scan of the chest that was done in March 2017 that I personally reviewed. This shows an area of rounded left basilar atelectasis in the left lower lobe. I do not see any evidence for this on a CT abdomen done in 2015. Pulmonary function testing was performed on 08/27/16 that I have reviewed and which shows moderate obstruction without a bronchodilator response, decreased residual volume that could suggest coexisting restriction, and decreased diffusion capacity that does not correct to the normal range when adjusted for alveolar volume.   He denies any dyspnea, does PT at home without dyspnea, no cough. No CP. He does have LE edema.    Review of Systems  Constitutional: Negative for fever and unexpected weight change.  HENT: Negative for congestion, dental problem, ear pain, nosebleeds, postnasal drip, rhinorrhea, sinus pressure, sneezing, sore throat and trouble swallowing.   Eyes: Negative for redness and itching.  Respiratory: Negative for cough, chest tightness, shortness of breath and wheezing.   Cardiovascular: Negative for palpitations and leg swelling.  Gastrointestinal: Negative for nausea and vomiting.  Genitourinary: Negative for dysuria.  Musculoskeletal: Positive for arthralgias and joint swelling.  Skin: Negative for rash.  Neurological: Negative for headaches.  Hematological: Does not bruise/bleed easily.  Psychiatric/Behavioral: Negative for dysphoric mood. The patient is not nervous/anxious.     Past Medical History:  Diagnosis Date  . Abnormality of gait 05/27/2016  . Adenomatous polyps   . Carpal tunnel syndrome  06/25/2016   Right  . Depressive disorder, not elsewhere classified   . Hereditary and idiopathic peripheral neuropathy 06/25/2016  . Hypertension   . Internal nasal lesion 05/15/2013  . Melanoma ([place])    Left Shoulder  . Mitral regurgitation   . MVP (mitral valve prolapse)    a. With severe MR s/p Complex valvuloplasty including artificial Gore-tex neochord placement x4, chordal transposition x1, chordal release x1, # 32 mm Sorin Memo 3D Ring Annuloplasty 2012.  Marland Kitchen Neuropathy ([place])   . Normal coronary arteries    a. Normal coronary anatomy by cath 2012.  . Osteoarthritis    Knees  . PAF (paroxysmal atrial fibrillation) ([place])    a. Post-op MVR 2012.  Marland Kitchen Personal history of colonic polyps   . Prostate cancer ([place])   . Pulmonary HTN    a. Mild-mod by cath 2012.  . Pure hypercholesterolemia   . PVC (premature ventricular contraction)   . Thrombocytopenia ([place])   . Vision abnormalities    Cornea scarring     Family History  Problem Relation Age of Onset  . Clotting disorder Brother     CVA's  . Arthritis Mother   . Hypertension Mother   . Stroke Mother   . Hypertension Father   . Psychosis Father     psychiatric care  . Colon cancer Neg Hx   . Stomach cancer Neg Hx   . Heart attack Neg Hx      Social History   Social History  . Marital status: Widowed    Spouse name: N/A  . Number of children: 2  . Years of education: 55   Occupational History  . Not on file.  Social History Main Topics  . Smoking status: Never Smoker  . Smokeless tobacco: Never Used  . Alcohol use No     Comment: Last drink in 2000  . Drug use: No  . Sexual activity: Not on file   Other Topics Concern  . Not on file   Social History Narrative   Retired - Optometrist   Widower   2 children   Drinks 1 cup of coffee per day  He had 2nd hand smoker exposure Was in [place], was in [place], never out of country.   No Known Allergies   Outpatient Medications Prior to Visit  Medication Sig  Dispense Refill  . amiodarone (PACERONE) 200 MG tablet Take 200 mg by mouth daily.     Marland Kitchen CARTIA XT 300 MG 24 hr capsule Take 300 mg by mouth daily.     . furosemide (LASIX) 40 MG tablet Take 40 mg by mouth every other day.     . gabapentin (NEURONTIN) 300 MG capsule Take 300 mg by mouth 2 (two) times daily.     . potassium chloride SA (K-DUR,KLOR-CON) 20 MEQ tablet Take 20 mEq by mouth daily.     . rosuvastatin (CRESTOR) 5 MG tablet Take 5 mg by mouth daily at 6 PM.     . TOPROL XL 50 MG 24 hr tablet Take 50 mg by mouth daily.     . traZODone (DESYREL) 100 MG tablet Take 300 mg by mouth at bedtime.     . vitamin C (ASCORBIC ACID) 500 MG tablet Take 500 mg by mouth 2 (two) times daily.    Marland Kitchen warfarin (COUMADIN) 5 MG tablet Take 1 tablet (5 mg total) by mouth daily. Take by mouth daily as directed by the coumadin clinic 25 tablet 3  . amoxicillin (AMOXIL) 500 MG capsule      No facility-administered medications prior to visit.         Objective:   Physical Exam Vitals:   09/26/16 1107 09/26/16 1110  BP:  114/70  Pulse:  61  SpO2:  96%  Weight: 189 lb (85.7 kg)   Height: 6\' 5"  (1.956 m)    Gen: Pleasant, tall, elderly man, in no distress,  normal affect  ENT: No lesions,  mouth clear,  oropharynx clear, no postnasal drip  Neck: No JVD, no TMG, no carotid bruits  Lungs: No use of accessory muscles, L basilar insp squeak, no wheeze.   Cardiovascular: RRR, heart sounds normal, no murmur or gallops, no peripheral edema  Musculoskeletal: No deformities, no cyanosis or clubbing  Neuro: alert, non focal  Skin: Warm, no lesions or rashes     Assessment & Plan:  Obstructive lung disease (generalized) ([place]) Obstruction noted on pulmonary function testing with an associated diffusion defect. He is asymptomatic. Etiology is unclear but he did have a secondhand smoke exposure. He does not recall any history of asthma. As he is asymptomatic I do not believe we need to start any  medications at this time. I would like for him to have a walking oximetry given the diffusion defect.  Paroxysmal atrial flutter ([place]) Although he does have abnormal pulmonary function testing and a diffusion defect I do not see anything on a CT scan from October or his more recent chest x-ray to suggest amiodarone toxicity. He can stay on the amiodarone and we will follow serial imaging and pulmonary function testing, next in October 2018. If he changes clinically we will follow sooner  Baltazar Apo, MD, PhD  09/26/2016, 11:46 AM [facility] [phone] or if no answer [phone]

## 2016-09-26 NOTE — Assessment & Plan Note (Signed)
Although he does have abnormal pulmonary function testing and a diffusion defect I do not see anything on a CT scan from October or his more recent chest x-ray to suggest amiodarone toxicity. He can stay on the amiodarone and we will follow serial imaging and pulmonary function testing, next in October 2018. If he changes clinically we will follow sooner

## 2016-09-26 NOTE — Patient Instructions (Addendum)
We will perform walking oximetry today There is no reason at this time to stop your amiodarone. We will continue to follow to insure that the medications does not become a problem for your lungs.  We will repeat your PFT in October 2018.  Follow with Dr Lamonte Sakai in 1 year with a CXR to review together. We would like to see you sooner if you develop any changes in your breathing .

## 2016-09-29 ENCOUNTER — Telehealth: Payer: Self-pay | Admitting: Pharmacist

## 2016-09-29 NOTE — Telephone Encounter (Signed)
Received fa from [facility] that pt is to undergo a bilateral knee radiofrequency ablation with request to hold Coumadin. Pt takes Coumadin for afib with CHADS2 score of 3 (HTN, age, and HF). [facility] for pt to hold Coumadin x5 days prior to procedure. Clearance faxed to [facility] [phone].

## 2016-10-01 ENCOUNTER — Ambulatory Visit (INDEPENDENT_AMBULATORY_CARE_PROVIDER_SITE_OTHER): Payer: Medicare Other | Admitting: Orthopedic Surgery

## 2016-10-01 ENCOUNTER — Encounter: Payer: Self-pay | Admitting: Vascular Surgery

## 2016-10-01 ENCOUNTER — Encounter (INDEPENDENT_AMBULATORY_CARE_PROVIDER_SITE_OTHER): Payer: Self-pay | Admitting: Orthopedic Surgery

## 2016-10-01 VITALS — BP 134/67 | HR 63 | Resp 16 | Ht 77.0 in | Wt 184.0 lb

## 2016-10-01 DIAGNOSIS — M7541 Impingement syndrome of right shoulder: Secondary | ICD-10-CM | POA: Diagnosis not present

## 2016-10-01 DIAGNOSIS — M25511 Pain in right shoulder: Secondary | ICD-10-CM

## 2016-10-01 MED ORDER — LIDOCAINE HCL 1 % IJ SOLN
2.0000 mL | INTRAMUSCULAR | Status: AC | PRN
  Administered 2016-10-01: 2 mL

## 2016-10-01 MED ORDER — BUPIVACAINE HCL 0.5 % IJ SOLN
2.0000 mL | INTRAMUSCULAR | Status: AC | PRN
  Administered 2016-10-01: 2 mL via INTRA_ARTICULAR

## 2016-10-01 MED ORDER — METHYLPREDNISOLONE ACETATE 40 MG/ML IJ SUSP
80.0000 mg | INTRAMUSCULAR | Status: AC | PRN
  Administered 2016-10-01: 80 mg

## 2016-10-01 NOTE — Progress Notes (Signed)
Office Visit Note   Patient: [name] [name]           Date of Birth: 01/09/1931           MRN: [id number] Visit Date: 10/01/2016              Requested by: [name], MD [address] [place], [place] [address] PCP: Penni Homans, MD   Assessment & Plan: Visit Diagnoses:  1. Acute pain of right shoulder   2. Impingement syndrome of right shoulder     Plan:  #1: Subacromial injection to the right shoulder #2: Call her MRI scan if not noted.  Follow-Up Instructions: No Follow-up on file.   Orders:  No orders of the defined types were placed in this encounter.  No orders of the defined types were placed in this encounter.     Procedures: Large Joint Inj Date/Time: 10/01/2016 7:37 PM Performed by: Biagio Borg D Authorized by: Biagio Borg D   Consent Given by:  Patient Timeout: prior to procedure the correct patient, procedure, and site was verified   Indications:  Pain Location:  Shoulder Site:  L subacromial bursa Prep: patient was prepped and draped in usual sterile fashion   Needle Size:  25 G Needle Length:  1.5 inches Approach:  Lateral Ultrasound Guidance: No   Fluoroscopic Guidance: No   Arthrogram: No   Medications:  80 mg methylPREDNISolone acetate 40 MG/ML; 2 mL lidocaine 1 %; 2 mL bupivacaine 0.5 % Aspiration Attempted: No   Patient tolerance:  Patient tolerated the procedure well with no immediate complications  Marked relief after injection with marked improvement in range of motion.     Clinical Data: No additional findings.   Subjective: Chief Complaint  Patient presents with  . Right Shoulder - Pain    [name] [name] is a 82 year old white male who is seen 09/11/2016 for evaluation of his right shoulder. Apparently he was walking in the kitchen and  is walker got too far in front of him and as he did he started to fall and he placed in his right shoulder into a fish tank as he was falling. He developed pain in his  right shoulder and also had decreased range of motion secondary to that. Treated conservatively bleeding Little improvement, pain, limited range of motion, sharp pain when sitting at times. Seen today for evaluation.    Review of Systems  HENT: Negative.   Respiratory: Negative.   Cardiovascular:       Hypertension  Gastrointestinal: Negative.      Objective: Vital Signs: BP 134/67 (BP Location: Left Arm, Patient Position: Sitting, Cuff Size: Large)   Pulse 63   Resp 16   Ht 6\' 5"  (1.956 m)   Wt 184 lb (83.5 kg)   BMI 21.82 kg/m   Physical Exam  Constitutional: He is oriented to person, place, and time. He appears well-developed and well-nourished.  HENT:  Head: Normocephalic and atraumatic.  Eyes: EOM are normal. Pupils are equal, round, and reactive to light.  Neck:  No carotid bruits  Pulmonary/Chest: Effort normal.  Neurological: He is alert and oriented to person, place, and time.  Skin: Skin is warm and dry.  Psychiatric: He has a normal mood and affect. His behavior is normal. Judgment and thought content normal.    Ortho Exam  Tenderness  The patient is experiencing tenderness in the acromioclavicular joint, acromion and biceps tendon.  Range of Motion  Active Abduction: 110  Passive Abduction: 120  External Rotation: 70  Internal Rotation 90 degrees: 30   Muscle Strength  Abduction: 4/5  Internal Rotation: 4/5  External Rotation: 4/5  Supraspinatus: 4/5  Subscapularis: 4/5  Biceps: 4/5   Tests  Impingement: positive  Other  Erythema: absent Sensation: normal Pulse: present  Specialty Comments:  No specialty comments available.  Imaging: No results found.   PMFS History: Patient Active Problem List   Diagnosis Date Noted  . Obstructive lung disease (generalized) ([place]) 09/26/2016  . Carpal tunnel syndrome 06/25/2016  . Hereditary and idiopathic peripheral neuropathy 06/25/2016  . Paresthesia 05/27/2016  . Abnormality of gait  05/27/2016  . Chest x-ray abnormality 01/09/2016  . Paroxysmal atrial flutter ([place]) 12/05/2015  . Constipation 06/13/2015  . Encounter for therapeutic drug monitoring 05/25/2014  . Syncope 04/03/2014  . Rhabdomyolysis 04/03/2014  . UTI (urinary tract infection) 04/03/2014  . Acute renal failure ([place]) 04/03/2014  . Leukocytosis 04/03/2014  . Internal nasal lesion 05/15/2013  . Low back pain 04/19/2013  . Melanoma ([place]) 09/30/2012  . Cough 03/26/2012  . Hx of mitral valve repair 11/19/2011  . Long term (current) use of anticoagulants 11/03/2011  . Decubitus skin ulcer 10/28/2011  . Pleural effusion due to congestive heart failure ([place]) 10/28/2011  . Atrial fibrillation ([place]) 10/07/2011  . S/P mitral valve repair 10/01/2011  . Valvular heart disease 08/21/2011  . Cerumen impaction 04/09/2011  . Hearing loss 04/09/2011  . THROMBOCYTOPENIA 09/19/2010  . ADENOCARCINOMA, PROSTATE 09/17/2010  . CALLUS, LEFT FOOT 09/17/2010  . BACK PAIN, CHRONIC 09/17/2010  . TINEA PEDIS 05/28/2009  . DERMATITIS, ATOPIC 04/10/2009  . HYPERCHOLESTEROLEMIA 06/09/2008  . DEPRESSION 06/09/2008  . HYPERTENSION, BENIGN ESSENTIAL 06/09/2008  . GERD 06/09/2008  . OSTEOARTHRITIS, GENERALIZED, MULTIPLE JOINTS 06/09/2008  . MUSCLE SPASM, BACK 06/09/2008  . PERSONAL HISTORY MALIGNANT NEOPLASM PROSTATE 06/09/2008  . PERSONAL HISTORY OF MALIGNANT MELANOMA OF SKIN 06/09/2008  . ARRHYTHMIA, HX OF 06/09/2008  . Personal history of colonic polyps 06/09/2008   Past Medical History:  Diagnosis Date  . Abnormality of gait 05/27/2016  . Adenomatous polyps   . Carpal tunnel syndrome 06/25/2016   Right  . Depressive disorder, not elsewhere classified   . Hereditary and idiopathic peripheral neuropathy 06/25/2016  . Hypertension   . Internal nasal lesion 05/15/2013  . Melanoma ([place])    Left Shoulder  . Mitral regurgitation   . MVP (mitral valve prolapse)    a. With severe MR s/p Complex valvuloplasty including  artificial Gore-tex neochord placement x4, chordal transposition x1, chordal release x1, # 32 mm Sorin Memo 3D Ring Annuloplasty 2012.  Marland Kitchen Neuropathy ([place])   . Normal coronary arteries    a. Normal coronary anatomy by cath 2012.  . Osteoarthritis    Knees  . PAF (paroxysmal atrial fibrillation) ([place])    a. Post-op MVR 2012.  Marland Kitchen Personal history of colonic polyps   . Prostate cancer ([place])   . Pulmonary HTN    a. Mild-mod by cath 2012.  . Pure hypercholesterolemia   . PVC (premature ventricular contraction)   . Thrombocytopenia ([place])   . Vision abnormalities    Cornea scarring    Family History  Problem Relation Age of Onset  . Clotting disorder Brother     CVA's  . Arthritis Mother   . Hypertension Mother   . Stroke Mother   . Hypertension Father   . Psychosis Father     psychiatric care  . Colon cancer Neg Hx   . Stomach  cancer Neg Hx   . Heart attack Neg Hx     Past Surgical History:  Procedure Laterality Date  . CARDIAC CATHETERIZATION  09/2011   Pre-op for MVR -- normal coronaries.  Marland Kitchen CARDIOVERSION N/A 01/02/2016   Procedure: CARDIOVERSION;  Surgeon: Thayer Headings, MD;  Location[facility]COPY;  Service: Cardiovascular;  Laterality: N/A;  . COLONOSCOPY W/ POLYPECTOMY    . INGUINAL HERNIA REPAIR  09/2009   Left  . KNEE ARTHROSCOPY      left x3  and right x2  . Melanoma Surgery     2001, 2005, 2006, 2009  . MITRAL VALVE REPAIR  10/01/2011   complex valvuloplasty with Goretex cord replacement and chordal transposition 1mm Sorin Memo 3D ring annuloplasty  . Nuclear Stress Test  09/2006   EF-64%, Normal  . PROSTATECTOMY  1993  . ROOT CANAL  08-19-12  . ROTATOR CUFF REPAIR  2003   left  . TEE WITHOUT CARDIOVERSION  09/26/2011   Procedure: TRANSESOPHAGEAL ECHOCARDIOGRAM (TEE);  Surgeon: Lelon Perla, MD;  Location: [facility] ENDOSCOPY;  Service: Cardiovascular;  Laterality: N/A;  . US ECHOCARDIOGRAPHY  09/2009, 08/1011   mild LVH,mild AI,MVP with mild MR, mild-mod. TR  with mild Pulm. HTN, EF-55-60%   Social History   Occupational History  . Not on file.   Social History Main Topics  . Smoking status: Never Smoker  . Smokeless tobacco: Never Used  . Alcohol use No     Comment: Last drink in 2000  . Drug use: No  . Sexual activity: Not on file

## 2016-10-13 ENCOUNTER — Ambulatory Visit (INDEPENDENT_AMBULATORY_CARE_PROVIDER_SITE_OTHER): Payer: Medicare Other | Admitting: Vascular Surgery

## 2016-10-13 VITALS — BP 122/66 | HR 68 | Temp 97.0°F | Resp 18 | Ht 77.0 in | Wt 186.0 lb

## 2016-10-13 DIAGNOSIS — I83891 Varicose veins of right lower extremities with other complications: Secondary | ICD-10-CM | POA: Diagnosis not present

## 2016-10-13 NOTE — Progress Notes (Signed)
Subjective:     Patient ID: [name] [name], male   DOB: 10-May-1931, 82 y.o.   MRN: [id number]  HPI This 82 year old male returns for continued follow-up regarding his pain and swelling in the right lower extremity due to gross reflux right great saphenous vein. He was evaluated 3 months ago by Dr. Trula Slade and he has been wearing long leg elastic compression stockings 20-30 millimeter gradient, has tried elevation and ibuprofen with no success. He continues to have significant progressive edema beginning at the right knee extending to the right foot. He also has noted that ulcerations in the right leg are harder to heal and has noted some darkening of the skin. He does have a history of mitral valve repair by Dr. Roxy Manns  and takes Coumadin and is followed by Dr.Varanasi.  Past Medical History:  Diagnosis Date  . Abnormality of gait 05/27/2016  . Adenomatous polyps   . Carpal tunnel syndrome 06/25/2016   Right  . Depressive disorder, not elsewhere classified   . Hereditary and idiopathic peripheral neuropathy 06/25/2016  . Hypertension   . Internal nasal lesion 05/15/2013  . Melanoma ([place])    Left Shoulder  . Mitral regurgitation   . MVP (mitral valve prolapse)    a. With severe MR s/p Complex valvuloplasty including artificial Gore-tex neochord placement x4, chordal transposition x1, chordal release x1, # 32 mm Sorin Memo 3D Ring Annuloplasty 2012.  Marland Kitchen Neuropathy ([place])   . Normal coronary arteries    a. Normal coronary anatomy by cath 2012.  . Osteoarthritis    Knees  . PAF (paroxysmal atrial fibrillation) ([place])    a. Post-op MVR 2012.  Marland Kitchen Personal history of colonic polyps   . Prostate cancer ([place])   . Pulmonary HTN    a. Mild-mod by cath 2012.  . Pure hypercholesterolemia   . PVC (premature ventricular contraction)   . Thrombocytopenia ([place])   . Vision abnormalities    Cornea scarring    Social History  Substance Use Topics  . Smoking status: Never Smoker  . Smokeless tobacco: Never  Used  . Alcohol use No     Comment: Last drink in 2000    Family History  Problem Relation Age of Onset  . Clotting disorder Brother     CVA's  . Arthritis Mother   . Hypertension Mother   . Stroke Mother   . Hypertension Father   . Psychosis Father     psychiatric care  . Colon cancer Neg Hx   . Stomach cancer Neg Hx   . Heart attack Neg Hx     No Known Allergies   Current Outpatient Prescriptions:  .  amiodarone (PACERONE) 200 MG tablet, Take 200 mg by mouth daily. , Disp: , Rfl:  .  CARTIA XT 300 MG 24 hr capsule, Take 300 mg by mouth daily. , Disp: , Rfl:  .  furosemide (LASIX) 40 MG tablet, Take 40 mg by mouth every other day. , Disp: , Rfl:  .  gabapentin (NEURONTIN) 300 MG capsule, Take 300 mg by mouth 2 (two) times daily. , Disp: , Rfl:  .  potassium chloride SA (K-DUR,KLOR-CON) 20 MEQ tablet, Take 20 mEq by mouth daily. , Disp: , Rfl:  .  rosuvastatin (CRESTOR) 5 MG tablet, Take 5 mg by mouth daily at 6 PM. , Disp: , Rfl:  .  TOPROL XL 50 MG 24 hr tablet, Take 50 mg by mouth daily. , Disp: , Rfl:  .  traZODone (DESYREL) 100  MG tablet, Take 300 mg by mouth at bedtime. , Disp: , Rfl:  .  vitamin C (ASCORBIC ACID) 500 MG tablet, Take 500 mg by mouth 2 (two) times daily., Disp: , Rfl:  .  warfarin (COUMADIN) 5 MG tablet, Take 1 tablet (5 mg total) by mouth daily. Take by mouth daily as directed by the coumadin clinic, Disp: 25 tablet, Rfl: 3  Vitals:   10/13/16 1429  BP: 122/66  Pulse: 68  Resp: 18  Temp: 97 F (36.1 C)  SpO2: 97%  Weight: 186 lb (84.4 kg)  Height: 6\' 5"  (1.956 m)    Body mass index is 22.06 kg/m.         Review of Systems has history of paroxysmal atrial fibrillation, mitral valve repair in 2012. Walks with a walker recently because of some instability.     Objective:   Physical Exam BP 122/66 (BP Location: Left Arm, Patient Position: Sitting, Cuff Size: Normal)   Pulse 68   Temp 97 F (36.1 C)   Resp 18   Ht 6\' 5"  (1.956 m)    Wt 186 lb (84.4 kg)   SpO2 97%   BMI 22.06 kg/m   Gen. elderly male no apparent distress alert and oriented 3 Lungs no rhonchi or wheezing Right leg with edema from distal thigh to foot-1-2+ with hyperpigmentation lower third right leg but no active ulcer. 3+ dorsalis pedis pulse palpable. Some bulging varicosities in the medial calf over the great saphenous system and some reticular veins as well.  No bulging varicosities left leg.  Patient had formal venous reflux exam at last visit which revealed gross reflux and a diffusely enlarged right great saphenous vein supplying painful varicosities. Patient has gross reflux right common femoral vein also.     Assessment:     Pain and swelling right leg due to gross reflux right great saphenous vein with no improvement using long leg elastic compression stockings 20-30 millimeter gradient, elevation, and ibuprofen. This is causing chronic swelling and has affected ability relation to in the right leg area and it is affecting daily living. History of mitral valve repair History paroxysmal atrial fibrillation on chronic Coumadin    Plan:     Patient needs laser ablation right great saphenous vein from the proximal calf to near the saphenofemoral junction to hopefully improve his chronic swelling and pain and decrease potential for further ulceration. He has failed conservative management We will proceed with precertification to perform this and wants it is scheduled we will communicate with Dr.Varanasi regarding how to deal with his anticoagulation around the timing of the procedure

## 2016-10-15 ENCOUNTER — Other Ambulatory Visit: Payer: Self-pay | Admitting: *Deleted

## 2016-10-15 ENCOUNTER — Telehealth: Payer: Self-pay | Admitting: Interventional Cardiology

## 2016-10-15 DIAGNOSIS — I83811 Varicose veins of right lower extremities with pain: Secondary | ICD-10-CM

## 2016-10-15 NOTE — Telephone Encounter (Signed)
Pt having a laser ablation of a vein in Jan with Dr. Kellie Simmering, he needs to know if pt can com off his Coumadin or will he need a Lovenox bridge? Pls call  Thea Silversmith [phone] ok to leave message on voicemail

## 2016-10-16 ENCOUNTER — Telehealth (INDEPENDENT_AMBULATORY_CARE_PROVIDER_SITE_OTHER): Payer: Self-pay | Admitting: Orthopaedic Surgery

## 2016-10-16 NOTE — Telephone Encounter (Signed)
Spoke with RN at office and she will confirm with Dr Kellie Simmering that it's ok for pt to hold Coumadin for 2 or 3 days rather than 5 given low bleed risk of procedure and pt will already be holding Coumadin for 5 days later in January for a different procedure. Procedure is scheduled for 1/9. Will await call back to confirm before faxing clearance.

## 2016-10-16 NOTE — Telephone Encounter (Signed)
Left message with Dr Evelena Leyden office regarding timing of procedure since pt is already scheduled to hold his Coumadin for 5 days prior to a bilateral knee radiofrequency ablation in the end of January.

## 2016-10-16 NOTE — Telephone Encounter (Signed)
Patient states his shoulder is not any better from the cortisone injection and was told the next step would be an MRI and he is requesting an MRI please. Please advise.

## 2016-10-16 NOTE — Telephone Encounter (Signed)
An MRI was not dictated in the Plan. Please advise

## 2016-10-20 ENCOUNTER — Ambulatory Visit (INDEPENDENT_AMBULATORY_CARE_PROVIDER_SITE_OTHER): Payer: Medicare Other

## 2016-10-20 ENCOUNTER — Other Ambulatory Visit (INDEPENDENT_AMBULATORY_CARE_PROVIDER_SITE_OTHER): Payer: Self-pay

## 2016-10-20 DIAGNOSIS — I4891 Unspecified atrial fibrillation: Secondary | ICD-10-CM | POA: Diagnosis not present

## 2016-10-20 DIAGNOSIS — Z5181 Encounter for therapeutic drug level monitoring: Secondary | ICD-10-CM

## 2016-10-20 DIAGNOSIS — M25511 Pain in right shoulder: Principal | ICD-10-CM

## 2016-10-20 DIAGNOSIS — G8929 Other chronic pain: Secondary | ICD-10-CM

## 2016-10-20 DIAGNOSIS — Z7901 Long term (current) use of anticoagulants: Secondary | ICD-10-CM

## 2016-10-20 LAB — POCT INR: INR: 2.6

## 2016-10-20 NOTE — Telephone Encounter (Signed)
DOne

## 2016-10-20 NOTE — Telephone Encounter (Signed)
Ok to schedule MRI.

## 2016-10-22 NOTE — Telephone Encounter (Signed)
Spoke with Dr Evelena Leyden office. They are ok with pt holding Coumadin 2-3 days prior to procedure and resume Coumadin 1/9 in the evening. Clearance faxed to [phone].

## 2016-10-28 ENCOUNTER — Ambulatory Visit
Admission: RE | Admit: 2016-10-28 | Discharge: 2016-10-28 | Disposition: A | Discharge status: Home / Self Care | Payer: Medicare Other | Source: Ambulatory Visit | Attending: Orthopaedic Surgery | Admitting: Orthopaedic Surgery

## 2016-10-28 DIAGNOSIS — G8929 Other chronic pain: Secondary | ICD-10-CM

## 2016-10-28 DIAGNOSIS — M25511 Pain in right shoulder: Principal | ICD-10-CM

## 2016-10-29 ENCOUNTER — Encounter: Payer: Self-pay | Admitting: Neurology

## 2016-10-29 ENCOUNTER — Ambulatory Visit (INDEPENDENT_AMBULATORY_CARE_PROVIDER_SITE_OTHER): Payer: Medicare Other | Admitting: Neurology

## 2016-10-29 VITALS — BP 125/72 | HR 63 | Ht 77.0 in | Wt 185.5 lb

## 2016-10-29 DIAGNOSIS — G609 Hereditary and idiopathic neuropathy, unspecified: Secondary | ICD-10-CM

## 2016-10-29 DIAGNOSIS — R269 Unspecified abnormalities of gait and mobility: Secondary | ICD-10-CM

## 2016-10-29 NOTE — Progress Notes (Signed)
Reason for visit: Peripheral neuropathy  [name] [name] is an 82 y.o. male  History of present illness:  [name] [name] is an 82 year old right-handed white male with a history of a gait disturbance associated with a peripheral neuropathy. The patient reports numbness only in the feet, he may have some slight numbness in the tips of the fingers. He is noting some problems with buttoning buttons. He uses a walker for ambulation, he has had one recent fall with injury to the right shoulder associated with a rotator cuff tear. The patient is on gabapentin taking 300 mg twice daily, but he reports no pain in the feet. The patient will be undergoing surgery for veins in the legs associated with swelling. The patient is getting physical therapy for his knees, strengthening exercises have helped his ability to ambulate. The patient does have stairs at home, but he also has a chair lift for the stairs. The patient returns to this office for an evaluation. Blood work done previously was unremarkable with exception that the rheumatoid factor was elevated. MRI of the cervical spine did not show spinal cord compression.  Past Medical History:  Diagnosis Date  . Abnormality of gait 05/27/2016  . Adenomatous polyps   . Carpal tunnel syndrome 06/25/2016   Right  . Depressive disorder, not elsewhere classified   . Hereditary and idiopathic peripheral neuropathy 06/25/2016  . Hypertension   . Internal nasal lesion 05/15/2013  . Melanoma ([place])    Left Shoulder  . Mitral regurgitation   . MVP (mitral valve prolapse)    a. With severe MR s/p Complex valvuloplasty including artificial Gore-tex neochord placement x4, chordal transposition x1, chordal release x1, # 32 mm Sorin Memo 3D Ring Annuloplasty 2012.  Marland Kitchen Neuropathy ([place])   . Normal coronary arteries    a. Normal coronary anatomy by cath 2012.  . Osteoarthritis    Knees  . PAF (paroxysmal atrial fibrillation) ([place])    a. Post-op MVR 2012.  Marland Kitchen Personal  history of colonic polyps   . Prostate cancer ([place])   . Pulmonary HTN    a. Mild-mod by cath 2012.  . Pure hypercholesterolemia   . PVC (premature ventricular contraction)   . Thrombocytopenia ([place])   . Vision abnormalities    Cornea scarring    Past Surgical History:  Procedure Laterality Date  . CARDIAC CATHETERIZATION  09/2011   Pre-op for MVR -- normal coronaries.  Marland Kitchen CARDIOVERSION N/A 01/02/2016   Procedure: CARDIOVERSION;  Surgeon: Thayer Headings, MD;  Location[facility] ENDOSCOPY;  Service: Cardiovascular;  Laterality: N/A;  . COLONOSCOPY W/ POLYPECTOMY    . INGUINAL HERNIA REPAIR  09/2009   Left  . KNEE ARTHROSCOPY      left x3  and right x2  . Melanoma Surgery     2001, 2005, 2006, 2009  . MITRAL VALVE REPAIR  10/01/2011   complex valvuloplasty with Goretex cord replacement and chordal transposition 31mm Sorin Memo 3D ring annuloplasty  . Nuclear Stress Test  09/2006   EF-64%, Normal  . PROSTATECTOMY  1993  . ROOT CANAL  08-19-12  . ROTATOR CUFF REPAIR  2003   left  . TEE WITHOUT CARDIOVERSION  09/26/2011   Procedure: TRANSESOPHAGEAL ECHOCARDIOGRAM (TEE);  Surgeon: Lelon Perla, MD;  Location: [facility] ENDOSCOPY;  Service: Cardiovascular;  Laterality: N/A;  . US ECHOCARDIOGRAPHY  09/2009, 08/1011   mild LVH,mild AI,MVP with mild MR, mild-mod. TR with mild Pulm. HTN, EF-55-60%    Family History  Problem Relation  Age of Onset  . Clotting disorder Brother     CVA's  . Arthritis Mother   . Hypertension Mother   . Stroke Mother   . Hypertension Father   . Psychosis Father     psychiatric care  . Colon cancer Neg Hx   . Stomach cancer Neg Hx   . Heart attack Neg Hx     Social history:  reports that he has never smoked. He has never used smokeless tobacco. He reports that he does not drink alcohol or use drugs.   No Known Allergies  Medications:  Prior to Admission medications   Medication Sig Start Date End Date Taking? Authorizing Provider  amiodarone (PACERONE)  200 MG tablet Take 200 mg by mouth daily.  09/03/16  Yes Historical Provider, MD  CARTIA XT 300 MG 24 hr capsule Take 300 mg by mouth daily.  08/05/16  Yes Historical Provider, MD  furosemide (LASIX) 40 MG tablet Take 40 mg by mouth every other day.  06/20/16  Yes Historical Provider, MD  gabapentin (NEURONTIN) 300 MG capsule Take 300 mg by mouth 2 (two) times daily.  08/12/16  Yes Historical Provider, MD  potassium chloride SA (K-DUR,KLOR-CON) 20 MEQ tablet Take 20 mEq by mouth daily.  08/04/16  Yes Historical Provider, MD  rosuvastatin (CRESTOR) 5 MG tablet Take 5 mg by mouth daily at 6 PM.  06/23/16  Yes Historical Provider, MD  TOPROL XL 50 MG 24 hr tablet Take 50 mg by mouth daily.  06/20/16  Yes Historical Provider, MD  traZODone (DESYREL) 100 MG tablet Take 300 mg by mouth at bedtime.  10/17/11  Yes Donielle Liston Alba, PA-C  vitamin C (ASCORBIC ACID) 500 MG tablet Take 500 mg by mouth 2 (two) times daily.   Yes Historical Provider, MD  warfarin (COUMADIN) 5 MG tablet Take 1 tablet (5 mg total) by mouth daily. Take by mouth daily as directed by the coumadin clinic 05/12/16  Yes [name], MD    ROS:  Out of a complete 14 system review of symptoms, the patient complains only of the following symptoms, and all other reviewed systems are negative.  Leg swelling Constipation Incontinence of the bladder, frequency of urination, urinary urgency Joint pain, back pain, walking difficulty Moles Bruising easily Numbness Depression  Blood pressure 125/72, pulse 63, height 6\' 5"  (1.956 m), weight 185 lb 8 oz (84.1 kg).  Physical Exam  General: The patient is alert and cooperative at the time of the examination.  Skin: 3+ edema below the knee is noted on the right, 1+ on the left.   Neurologic Exam  Mental status: The patient is alert and oriented x 3 at the time of the examination. The patient has apparent normal recent and remote memory, with an apparently normal attention span and  concentration ability.   Cranial nerves: Facial symmetry is present. Speech is normal, no aphasia or dysarthria is noted. Extraocular movements are full. Visual fields are full.  Motor: The patient has good strength in all 4 extremities.  Sensory examination: Soft touch sensation is symmetric on the face, arms, and legs.  Coordination: The patient has good finger-nose-finger and heel-to-shin bilaterally.  Gait and station: The patient has a wide-based, unsteady gait. The patient normally uses a walker for ambulation. Tandem gait was not attempted. Romberg is negative. No drift is seen.  Reflexes: Deep tendon reflexes are symmetric.   Assessment/Plan:  1. Peripheral neuropathy  2. Gait disturbance  Blood work has shown an elevated  rheumatoid factor, the clinical significance of this is not clear. I have recommended a rheumatologic evaluation for this purpose, the patient is not clear that he wants to do this at this time, he may call our office later he decides he wishes to pursue this. The patient is on gabapentin, but he reports no discomfort in the feet. I have recommended cutting back to 300 mg at night for 2 weeks, if he does well without pain, he is to discontinue the medication. He will follow-up in 6 months.  Jill Alexanders MD 10/29/2016 10:16 AM  [facility] [address] [place], [place] [address]  Phone [phone] Fax [phone]

## 2016-10-29 NOTE — Patient Instructions (Addendum)
Fall Prevention in the Home Falls can cause injuries and can affect people from all age groups. There are many simple things that you can do to make your home safe and to help prevent falls. What can I do on the outside of my home?  Regularly repair the edges of walkways and driveways and fix any cracks.  Remove high doorway thresholds.  Trim any shrubbery on the main path into your home.  Use bright outdoor lighting.  Clear walkways of debris and clutter, including tools and rocks.  Regularly check that handrails are securely fastened and in good repair. Both sides of any steps should have handrails.  Install guardrails along the edges of any raised decks or porches.  Have leaves, snow, and ice cleared regularly.  Use sand or salt on walkways during winter months.  In the garage, clean up any spills right away, including grease or oil spills. What can I do in the bathroom?  Use night lights.  Install grab bars by the toilet and in the tub and shower. Do not use towel bars as grab bars.  Use non-skid mats or decals on the floor of the tub or shower.  If you need to sit down while you are in the shower, use a plastic, non-slip stool.  Keep the floor dry. Immediately clean up any water that spills on the floor.  Remove soap buildup in the tub or shower on a regular basis.  Attach bath mats securely with double-sided non-slip rug tape.  Remove throw rugs and other tripping hazards from the floor. What can I do in the bedroom?  Use night lights.  Make sure that a bedside light is easy to reach.  Do not use oversized bedding that drapes onto the floor.  Have a firm chair that has side arms to use for getting dressed.  Remove throw rugs and other tripping hazards from the floor. What can I do in the kitchen?  Clean up any spills right away.  Avoid walking on wet floors.  Place frequently used items in easy-to-reach places.  If you need to reach for something above  you, use a sturdy step stool that has a grab bar.  Keep electrical cables out of the way.  Do not use floor polish or wax that makes floors slippery. If you have to use wax, make sure that it is non-skid floor wax.  Remove throw rugs and other tripping hazards from the floor. What can I do in the stairways?  Do not leave any items on the stairs.  Make sure that there are handrails on both sides of the stairs. Fix handrails that are broken or loose. Make sure that handrails are as long as the stairways.  Check any carpeting to make sure that it is firmly attached to the stairs. Fix any carpet that is loose or worn.  Avoid having throw rugs at the top or bottom of stairways, or secure the rugs with carpet tape to prevent them from moving.  Make sure that you have a light switch at the top of the stairs and the bottom of the stairs. If you do not have them, have them installed. What are some other fall prevention tips?  Wear closed-toe shoes that fit well and support your feet. Wear shoes that have rubber soles or low heels.  When you use a stepladder, make sure that it is completely opened and that the sides are firmly locked. Have someone hold the ladder while you are using   it. Do not climb a closed stepladder.  Add color or contrast paint or tape to grab bars and handrails in your home. Place contrasting color strips on the first and last steps.  Use mobility aids as needed, such as canes, walkers, scooters, and crutches.  Turn on lights if it is dark. Replace any light bulbs that burn out.  Set up furniture so that there are clear paths. Keep the furniture in the same spot.  Fix any uneven floor surfaces.  Choose a carpet design that does not hide the edge of steps of a stairway.  Be aware of any and all pets.  Review your medicines with your healthcare provider. Some medicines can cause dizziness or changes in blood pressure, which increase your risk of falling. Talk with  your health care provider about other ways that you can decrease your risk of falls. This may include working with a physical therapist or trainer to improve your strength, balance, and endurance. This information is not intended to replace advice given to you by your health care provider. Make sure you discuss any questions you have with your health care provider. Document Released: 10/24/2002 Document Revised: 04/01/2016 Document Reviewed: 12/08/2014 Elsevier Interactive Patient Education  2017 [employer]  

## 2016-10-31 ENCOUNTER — Encounter (INDEPENDENT_AMBULATORY_CARE_PROVIDER_SITE_OTHER): Payer: Self-pay | Admitting: Orthopaedic Surgery

## 2016-10-31 ENCOUNTER — Ambulatory Visit (INDEPENDENT_AMBULATORY_CARE_PROVIDER_SITE_OTHER): Payer: Medicare Other | Admitting: Orthopaedic Surgery

## 2016-10-31 VITALS — Resp 14 | Ht 77.0 in | Wt 184.0 lb

## 2016-10-31 DIAGNOSIS — G8929 Other chronic pain: Secondary | ICD-10-CM

## 2016-10-31 DIAGNOSIS — M25511 Pain in right shoulder: Secondary | ICD-10-CM | POA: Diagnosis not present

## 2016-10-31 NOTE — Progress Notes (Signed)
Office Visit Note   Patient: [name] [name]           Date of Birth: 1931/06/26           MRN: [id number] Visit Date: 10/31/2016              Requested by: [name], MD [address] [place],  [address] PCP: Penni Homans, MD   Assessment & Plan: Visit Diagnoses: Rotator cuff tear right shoulder involving in for an supraspinatus. Moderate degenerative glenohumeral arthropathy and mild degenerative acromioclavicular joint arthropathy   Plan: Have had a rigorous discussion regarding different treatment options. We'll plan to consider surgery sometime in February per patient reference. We will schedule physical therapy in the interim   Follow-Up Instructions: No Follow-up on file.   Orders:  No orders of the defined types were placed in this encounter.  No orders of the defined types were placed in this encounter.     Procedures: No procedures performed   Clinical Data: No additional findings.   Subjective: No chief complaint on file.   Pt here for MRI results of Right shoulder    Mr [name] [name] has had a chronic problem with his right shoulder  as previously mentioned. Proceed with scheduling an MRI scan. This scan was recently performed and demonstrates full thickness partial width tearing of the infraspinatus and supraspinatus tendons there is prominent bursal surface partial tearing and tendon delamination of the supraspinatus. Moderate degenerative glenohumeral arthropathy and mild degenerative before meals joint arthropathy.  Has had discomfort for months. Uses a walker for balance which has aggravated the shoulder.  Review of Systems   Objective: Vital Signs: Resp 14   Ht 6\' 5"  (1.956 m)   Wt 184 lb (83.5 kg)   BMI 21.82 kg/m   Physical Exam  Ortho Exam right shoulder with full overhead motion. There is a painful arc. There is pain at about 90 of abduction in the lateral subacromial region. There is considerable weakness with  external rotation. None with internal rotation. Positive empty can and impingement testing biceps appears intact Specialty Comments:  No specialty comments available.  Imaging: No results found.   PMFS History: Patient Active Problem List   Diagnosis Date Noted  . Varicose veins of right lower extremity with complications 0000000  . Obstructive lung disease (generalized) ([place]) 09/26/2016  . Carpal tunnel syndrome 06/25/2016  . Hereditary and idiopathic peripheral neuropathy 06/25/2016  . Paresthesia 05/27/2016  . Abnormality of gait 05/27/2016  . Chest x-ray abnormality 01/09/2016  . Paroxysmal atrial flutter ([place]) 12/05/2015  . Constipation 06/13/2015  . Encounter for therapeutic drug monitoring 05/25/2014  . Syncope 04/03/2014  . Rhabdomyolysis 04/03/2014  . UTI (urinary tract infection) 04/03/2014  . Acute renal failure ([place]) 04/03/2014  . Leukocytosis 04/03/2014  . Internal nasal lesion 05/15/2013  . Low back pain 04/19/2013  . Melanoma ([place]) 09/30/2012  . Cough 03/26/2012  . Hx of mitral valve repair 11/19/2011  . Long term (current) use of anticoagulants 11/03/2011  . Decubitus skin ulcer 10/28/2011  . Pleural effusion due to congestive heart failure ([place]) 10/28/2011  . Atrial fibrillation ([place]) 10/07/2011  . S/P mitral valve repair 10/01/2011  . Valvular heart disease 08/21/2011  . Cerumen impaction 04/09/2011  . Hearing loss 04/09/2011  . THROMBOCYTOPENIA 09/19/2010  . ADENOCARCINOMA, PROSTATE 09/17/2010  . CALLUS, LEFT FOOT 09/17/2010  . BACK PAIN, CHRONIC 09/17/2010  . TINEA PEDIS 05/28/2009  . DERMATITIS, ATOPIC 04/10/2009  . HYPERCHOLESTEROLEMIA 06/09/2008  .  DEPRESSION 06/09/2008  . HYPERTENSION, BENIGN ESSENTIAL 06/09/2008  . GERD 06/09/2008  . OSTEOARTHRITIS, GENERALIZED, MULTIPLE JOINTS 06/09/2008  . MUSCLE SPASM, BACK 06/09/2008  . PERSONAL HISTORY MALIGNANT NEOPLASM PROSTATE 06/09/2008  . PERSONAL HISTORY OF MALIGNANT MELANOMA OF SKIN 06/09/2008   . ARRHYTHMIA, HX OF 06/09/2008  . Personal history of colonic polyps 06/09/2008   Past Medical History:  Diagnosis Date  . Abnormality of gait 05/27/2016  . Adenomatous polyps   . Carpal tunnel syndrome 06/25/2016   Right  . Depressive disorder, not elsewhere classified   . Hereditary and idiopathic peripheral neuropathy 06/25/2016  . Hypertension   . Internal nasal lesion 05/15/2013  . Melanoma ([place])    Left Shoulder  . Mitral regurgitation   . MVP (mitral valve prolapse)    a. With severe MR s/p Complex valvuloplasty including artificial Gore-tex neochord placement x4, chordal transposition x1, chordal release x1, # 32 mm Sorin Memo 3D Ring Annuloplasty 2012.  Marland Kitchen Neuropathy ([place])   . Normal coronary arteries    a. Normal coronary anatomy by cath 2012.  . Osteoarthritis    Knees  . PAF (paroxysmal atrial fibrillation) ([place])    a. Post-op MVR 2012.  Marland Kitchen Personal history of colonic polyps   . Prostate cancer ([place])   . Pulmonary HTN    a. Mild-mod by cath 2012.  . Pure hypercholesterolemia   . PVC (premature ventricular contraction)   . Thrombocytopenia ([place])   . Vision abnormalities    Cornea scarring    Family History  Problem Relation Age of Onset  . Clotting disorder Brother     CVA's  . Arthritis Mother   . Hypertension Mother   . Stroke Mother   . Hypertension Father   . Psychosis Father     psychiatric care  . Colon cancer Neg Hx   . Stomach cancer Neg Hx   . Heart attack Neg Hx     Past Surgical History:  Procedure Laterality Date  . CARDIAC CATHETERIZATION  09/2011   Pre-op for MVR -- normal coronaries.  Marland Kitchen CARDIOVERSION N/A 01/02/2016   Procedure: CARDIOVERSION;  Surgeon: Thayer Headings, MD;  Location: [facility] ENDOSCOPY;  Service: Cardiovascular;  Laterality: N/A;  . COLONOSCOPY W/ POLYPECTOMY    . INGUINAL HERNIA REPAIR  09/2009   Left  . KNEE ARTHROSCOPY      left x3  and right x2  . Melanoma Surgery     2001, 2005, 2006, 2009  . MITRAL VALVE REPAIR   10/01/2011   complex valvuloplasty with Goretex cord replacement and chordal transposition 69mm Sorin Memo 3D ring annuloplasty  . Nuclear Stress Test  09/2006   EF-64%, Normal  . PROSTATECTOMY  1993  . ROOT CANAL  08-19-12  . ROTATOR CUFF REPAIR  2003   left  . TEE WITHOUT CARDIOVERSION  09/26/2011   Procedure: TRANSESOPHAGEAL ECHOCARDIOGRAM (TEE);  Surgeon: Lelon Perla, MD;  Location: [facility] ENDOSCOPY;  Service: Cardiovascular;  Laterality: N/A;  . US ECHOCARDIOGRAPHY  09/2009, 08/1011   mild LVH,mild AI,MVP with mild MR, mild-mod. TR with mild Pulm. HTN, EF-55-60%   Social History   Occupational History  . Not on file.   Social History Main Topics  . Smoking status: Never Smoker  . Smokeless tobacco: Never Used  . Alcohol use No     Comment: Last drink in 2000  . Drug use: No  . Sexual activity: Not on file

## 2016-11-04 ENCOUNTER — Other Ambulatory Visit: Payer: Self-pay | Admitting: *Deleted

## 2016-11-04 MED ORDER — POTASSIUM CHLORIDE CRYS ER 20 MEQ PO TBCR: 20.0000 meq | EXTENDED_RELEASE_TABLET | Freq: Every day | ORAL | 2 refills | Status: DC

## 2016-11-07 ENCOUNTER — Encounter: Payer: Self-pay | Admitting: Vascular Surgery

## 2016-11-12 ENCOUNTER — Telehealth (INDEPENDENT_AMBULATORY_CARE_PROVIDER_SITE_OTHER): Payer: Self-pay | Admitting: Orthopaedic Surgery

## 2016-11-12 NOTE — Telephone Encounter (Signed)
LVM for pt on 12/19, 12/21, and 12/27. Pt has not returned call. Will await pts call to schedule surgery.

## 2016-11-13 ENCOUNTER — Ambulatory Visit: Payer: Medicare Other | Attending: Orthopaedic Surgery | Admitting: Physical Therapy

## 2016-11-13 ENCOUNTER — Encounter: Payer: Self-pay | Admitting: Physical Therapy

## 2016-11-13 DIAGNOSIS — M25611 Stiffness of right shoulder, not elsewhere classified: Secondary | ICD-10-CM | POA: Insufficient documentation

## 2016-11-13 DIAGNOSIS — R262 Difficulty in walking, not elsewhere classified: Secondary | ICD-10-CM | POA: Insufficient documentation

## 2016-11-13 DIAGNOSIS — M25511 Pain in right shoulder: Secondary | ICD-10-CM | POA: Diagnosis present

## 2016-11-13 NOTE — Therapy (Signed)
[address] [place], [place], [address] Phone: [phone]   Fax:  [phone]  Physical Therapy Evaluation  Patient Details  Name: [name] [name] MRN: [id number] Date of Birth: [birth year] Referring Provider: Durward Fortes  Encounter Date: 11/13/2016      PT End of Session - 11/13/16 1449    Visit Number 1   Date for PT Re-Evaluation 01/14/17   PT Start Time 1355   PT Stop Time 1450   PT Time Calculation (min) 55 min   Activity Tolerance Patient tolerated treatment well   Behavior During Therapy [facility] for tasks assessed/performed      Past Medical History:  Diagnosis Date  . Abnormality of gait 05/27/2016  . Adenomatous polyps   . Carpal tunnel syndrome 06/25/2016   Right  . Depressive disorder, not elsewhere classified   . Hereditary and idiopathic peripheral neuropathy 06/25/2016  . Hypertension   . Internal nasal lesion 05/15/2013  . Melanoma ([place])    Left Shoulder  . Mitral regurgitation   . MVP (mitral valve prolapse)    a. With severe MR s/p Complex valvuloplasty including artificial Gore-tex neochord placement x4, chordal transposition x1, chordal release x1, # 32 mm Sorin Memo 3D Ring Annuloplasty 2012.  Marland Kitchen Neuropathy ([place])   . Normal coronary arteries    a. Normal coronary anatomy by cath 2012.  . Osteoarthritis    Knees  . PAF (paroxysmal atrial fibrillation) ([place])    a. Post-op MVR 2012.  Marland Kitchen Personal history of colonic polyps   . Prostate cancer ([place])   . Pulmonary HTN    a. Mild-mod by cath 2012.  . Pure hypercholesterolemia   . PVC (premature ventricular contraction)   . Thrombocytopenia ([place])   . Vision abnormalities    Cornea scarring    Past Surgical History:  Procedure Laterality Date  . CARDIAC CATHETERIZATION  09/2011   Pre-op for MVR -- normal coronaries.  Marland Kitchen CARDIOVERSION N/A 01/02/2016   Procedure: CARDIOVERSION;  Surgeon: Thayer Headings, MD;  Location: [facility] ENDOSCOPY;   Service: Cardiovascular;  Laterality: N/A;  . COLONOSCOPY W/ POLYPECTOMY    . INGUINAL HERNIA REPAIR  09/2009   Left  . KNEE ARTHROSCOPY      left x3  and right x2  . Melanoma Surgery     2001, 2005, 2006, 2009  . MITRAL VALVE REPAIR  10/01/2011   complex valvuloplasty with Goretex cord replacement and chordal transposition 76mm Sorin Memo 3D ring annuloplasty  . Nuclear Stress Test  09/2006   EF-64%, Normal  . PROSTATECTOMY  1993  . ROOT CANAL  08-19-12  . ROTATOR CUFF REPAIR  2003   left  . TEE WITHOUT CARDIOVERSION  09/26/2011   Procedure: TRANSESOPHAGEAL ECHOCARDIOGRAM (TEE);  Surgeon: Lelon Perla, MD;  Location: [facility] ENDOSCOPY;  Service: Cardiovascular;  Laterality: N/A;  . US ECHOCARDIOGRAPHY  09/2009, 08/1011   mild LVH,mild AI,MVP with mild MR, mild-mod. TR with mild Pulm. HTN, EF-55-60%    There were no vitals filed for this visit.       Subjective Assessment - 11/13/16 1401    Subjective Patient reports that he had a fall in September and hit his right shoulder.  MRI showed that he sustained a full thinkness partial tear of the infraspinatus and the supraspinatus.  He uses a walker to ambulate and is fearful that if he has surgery he will not be able to stay home as he lives alone.  Limitations Walking;Lifting   Patient Stated Goals use the right arm for ADL's have less pain   Currently in Pain? Yes   Pain Score 3    Pain Location Shoulder   Pain Orientation Right   Pain Descriptors / Indicators Aching;Sore   Pain Type Acute pain   Pain Onset More than a month ago   Pain Frequency Constant   Aggravating Factors  worse at night and worse with reaching and lifting up to 8/10   Pain Relieving Factors rest   Effect of Pain on Daily Activities limts most ADL's            [facility] PT Assessment - 11/13/16 0001      Assessment   Medical Diagnosis right RC tear   Referring Provider [place]   Onset Date/Surgical Date 08/14/16   Hand Dominance Right   Prior  Therapy no     Precautions   Precautions Fall     Balance Screen   Has the patient fallen in the past 6 months Yes   How many times? 12  knees give out   Has the patient had a decrease in activity level because of a fear of falling?  No   Is the patient reluctant to leave their home because of a fear of falling?  No     Home Environment   Additional Comments lives alone, has  a sleaning lady     Prior Function   Level of Independence Independent with household mobility with device;Independent with community mobility with device   Vocation Retired   Leisure no exercise     Posture/Postural Control   Posture Comments fwd head, rounded shoulders     ROM / Strength   AROM / PROM / Strength AROM;PROM;Strength     AROM   AROM Assessment Site Shoulder   Right/Left Shoulder Right   Right Shoulder Flexion 130 Degrees   Right Shoulder ABduction 130 Degrees   Right Shoulder Internal Rotation 60 Degrees   Right Shoulder External Rotation 60 Degrees     PROM   PROM Assessment Site Shoulder   Right/Left Shoulder Right   Right Shoulder Flexion 145 Degrees   Right Shoulder ABduction 145 Degrees   Right Shoulder Internal Rotation 65 Degrees   Right Shoulder External Rotation 70 Degrees     Strength   Overall Strength Comments ER 3+/5, IR 4-/5, flexion and abduction 3/5 with pain for all motions and MMT     Palpation   Palpation comment tender anterior and lateral right shoulder     Ambulation/Gait   Gait Comments uses a walker, slow and unsteady, reports 12 falls over the past 6 months, we may need to look at balance to assure his safety, he does report only one fall since he started using the walker                   [facility] Adult PT Treatment/Exercise - 11/13/16 0001      Modalities   Modalities Moist Heat;Electrical Stimulation     Moist Heat Therapy   Number Minutes Moist Heat 15 Minutes   Moist Heat Location Shoulder     Electrical Stimulation   Electrical  Stimulation Location right shoulder   Electrical Stimulation Action IFC   Electrical Stimulation Parameters sitting   Electrical Stimulation Goals Pain                PT Education - 11/13/16 1432    Education provided (P)  Yes  Person(s) Educated (P)  Patient   Methods (P)  Explanation;Demonstration;Handout   Comprehension (P)  Verbalized understanding;Returned demonstration          PT Short Term Goals - 11/13/16 1500      PT SHORT TERM GOAL #1   Title independent with initial HEP   Time 2   Period Weeks   Status New           PT Long Term Goals - 11/13/16 1502      PT LONG TERM GOAL #1   Title understand proper posture and body mechanics   Time 8   Period Weeks   Status New     PT LONG TERM GOAL #2   Title increase right shoulder AROM to 130 degrees flexion   Time 8   Period Weeks   Status New     PT LONG TERM GOAL #3   Title decrease pain 50%   Time 8   Period Weeks   Status New     PT LONG TERM GOAL #4   Title increase right shoulder strength to 4-/5 for ER   Time 8   Period Weeks   Status New               Plan - 11/13/16 1452    Clinical Impression Statement Patient reports that he has had 12 falls over the past 6 months, He had a fall 3 months ago and an MRI revealed a full thickness partial tear of the supraspnatus and the infraspinatus.  He uses a walker for gait and reports that he reason he falls is that his knees give out.  He has good ROM but very poor strength and pain for the ROM.   Rehab Potential Good   PT Frequency 2x / week   PT Duration 8 weeks   PT Treatment/Interventions ADLs/Self Care Home Management;Cryotherapy;Electrical Stimulation;Moist Heat;Ultrasound;Therapeutic activities;Therapeutic exercise;Balance training;Neuromuscular re-education;Manual techniques   PT Next Visit Plan slowly add exercises and stabilization of the scapula and RC mm   Consulted and Agree with Plan of Care Patient      Patient will  benefit from skilled therapeutic intervention in order to improve the following deficits and impairments:  Abnormal gait, Decreased activity tolerance, Decreased balance, Decreased mobility, Decreased range of motion, Decreased strength, Difficulty walking, Postural dysfunction, Pain  Visit Diagnosis: Acute pain of right shoulder - Plan: PT plan of care cert/re-cert  Stiffness of right shoulder, not elsewhere classified - Plan: PT plan of care cert/re-cert  Difficulty in walking, not elsewhere classified - Plan: PT plan of care cert/re-cert      G-Codes - [id number]    Functional Assessment Tool Used foto 71% limitation   Functional Limitation Other PT primary   Other PT Primary Current Status [id number]) At least 60 percent but less than 80 percent impaired, limited or restricted       Problem List Patient Active Problem List   Diagnosis Date Noted  . Varicose veins of right lower extremity with complications 0000000  . Obstructive lung disease (generalized) ([place]) 09/26/2016  . Carpal tunnel syndrome 06/25/2016  . Hereditary and idiopathic peripheral neuropathy 06/25/2016  . Paresthesia 05/27/2016  . Abnormality of gait 05/27/2016  . Chest x-ray abnormality 01/09/2016  . Paroxysmal atrial flutter ([place]) 12/05/2015  . Constipation 06/13/2015  . Encounter for therapeutic drug monitoring 05/25/2014  . Syncope 04/03/2014  . Rhabdomyolysis 04/03/2014  . UTI (urinary tract infection) 04/03/2014  . Acute renal failure ([place]) 04/03/2014  . Leukocytosis  04/03/2014  . Internal nasal lesion 05/15/2013  . Low back pain 04/19/2013  . Melanoma ([place]) 09/30/2012  . Cough 03/26/2012  . Hx of mitral valve repair 11/19/2011  . Long term (current) use of anticoagulants 11/03/2011  . Decubitus skin ulcer 10/28/2011  . Pleural effusion due to congestive heart failure ([place]) 10/28/2011  . Atrial fibrillation ([place]) 10/07/2011  . S/P mitral valve repair 10/01/2011  . Valvular heart disease  08/21/2011  . Cerumen impaction 04/09/2011  . Hearing loss 04/09/2011  . THROMBOCYTOPENIA 09/19/2010  . ADENOCARCINOMA, PROSTATE 09/17/2010  . CALLUS, LEFT FOOT 09/17/2010  . BACK PAIN, CHRONIC 09/17/2010  . TINEA PEDIS 05/28/2009  . DERMATITIS, ATOPIC 04/10/2009  . HYPERCHOLESTEROLEMIA 06/09/2008  . DEPRESSION 06/09/2008  . HYPERTENSION, BENIGN ESSENTIAL 06/09/2008  . GERD 06/09/2008  . OSTEOARTHRITIS, GENERALIZED, MULTIPLE JOINTS 06/09/2008  . MUSCLE SPASM, BACK 06/09/2008  . PERSONAL HISTORY MALIGNANT NEOPLASM PROSTATE 06/09/2008  . PERSONAL HISTORY OF MALIGNANT MELANOMA OF SKIN 06/09/2008  . ARRHYTHMIA, HX OF 06/09/2008  . Personal history of colonic polyps 06/09/2008    Sumner Boast., PT 11/13/2016, 3:24 PM  [address] [place], [place], [address] Phone: [phone]   Fax:  [phone]  Name: [name] [name] MRN: [id number] Date of Birth: 10/02/1931

## 2016-11-18 ENCOUNTER — Telehealth (INDEPENDENT_AMBULATORY_CARE_PROVIDER_SITE_OTHER): Payer: Self-pay | Admitting: Orthopaedic Surgery

## 2016-11-18 NOTE — Telephone Encounter (Signed)
Attempted to return pts call to schedule surgery, no answer. LVM for pt to call back to schedule.

## 2016-11-18 NOTE — Telephone Encounter (Signed)
Patient called and stated he would like to wait to schedule surgery and see how therapy does for him. He began therapy last week. Also, he wanted to know that if he had the surgery that while wearing the sling how would he use his walker to get around? He asked if you could please speak to him about this. He stays at home alone. Uses walker to get around everywhere. He also mentioned if he had surgery he may would like to go to a inpatient rehab facility after surgery. Pt will call when ready to schedule surgery.

## 2016-11-18 NOTE — Telephone Encounter (Signed)
called

## 2016-11-20 ENCOUNTER — Ambulatory Visit: Payer: Medicare Other | Admitting: Physical Therapy

## 2016-11-20 ENCOUNTER — Ambulatory Visit: Payer: Medicare Other | Attending: Orthopaedic Surgery | Admitting: Physical Therapy

## 2016-11-20 ENCOUNTER — Telehealth: Payer: Self-pay | Admitting: *Deleted

## 2016-11-20 ENCOUNTER — Encounter: Payer: Self-pay | Admitting: Physical Therapy

## 2016-11-20 DIAGNOSIS — M25611 Stiffness of right shoulder, not elsewhere classified: Secondary | ICD-10-CM | POA: Diagnosis present

## 2016-11-20 DIAGNOSIS — R262 Difficulty in walking, not elsewhere classified: Secondary | ICD-10-CM | POA: Diagnosis present

## 2016-11-20 DIAGNOSIS — M25511 Pain in right shoulder: Secondary | ICD-10-CM | POA: Insufficient documentation

## 2016-11-20 NOTE — Telephone Encounter (Signed)
Returning Mr. Guisinger' earlier phone message regarding using Ibuprofen post laser ablation.  Mr. Uyehara is scheduled for endovenous laser ablation of right greater saphenous vein on 11-25-2016 with Victorino Dike MD.  Mr. Sandler states his cardiologist won't allow him to take Ibuprofen.  Assured Mr. Oshel that it was not necessary for him to take Ibuprofen post laser ablation.  Explained he could use ice compresses and Arnicare topical gel prn right leg discomfort post ablation.  Mr. Kurek verbalized understanding.

## 2016-11-20 NOTE — Therapy (Signed)
[place], [place], [address] Phone: [phone]   Fax:  [phone]  Physical Therapy Treatment  Patient Details  Name: [name] [name] MRN: [id number] Date of Birth: 08-11-1931 Referring Provider: Durward Fortes  Encounter Date: 11/20/2016      PT End of Session - 11/20/16 1513    Visit Number 2   Date for PT Re-Evaluation 01/14/17   PT Start Time 1430   PT Stop Time 1513   PT Time Calculation (min) 43 min   Activity Tolerance Patient tolerated treatment well   Behavior During Therapy [facility] for tasks assessed/performed      Past Medical History:  Diagnosis Date  . Abnormality of gait 05/27/2016  . Adenomatous polyps   . Carpal tunnel syndrome 06/25/2016   Right  . Depressive disorder, not elsewhere classified   . Hereditary and idiopathic peripheral neuropathy 06/25/2016  . Hypertension   . Internal nasal lesion 05/15/2013  . Melanoma ([place])    Left Shoulder  . Mitral regurgitation   . MVP (mitral valve prolapse)    a. With severe MR s/p Complex valvuloplasty including artificial Gore-tex neochord placement x4, chordal transposition x1, chordal release x1, # 32 mm Sorin Memo 3D Ring Annuloplasty 2012.  Marland Kitchen Neuropathy ([place])   . Normal coronary arteries    a. Normal coronary anatomy by cath 2012.  . Osteoarthritis    Knees  . PAF (paroxysmal atrial fibrillation) ([place])    a. Post-op MVR 2012.  Marland Kitchen Personal history of colonic polyps   . Prostate cancer ([place])   . Pulmonary HTN    a. Mild-mod by cath 2012.  . Pure hypercholesterolemia   . PVC (premature ventricular contraction)   . Thrombocytopenia ([place])   . Vision abnormalities    Cornea scarring    Past Surgical History:  Procedure Laterality Date  . CARDIAC CATHETERIZATION  09/2011   Pre-op for MVR -- normal coronaries.  Marland Kitchen CARDIOVERSION N/A 01/02/2016   Procedure: CARDIOVERSION;  Surgeon: Thayer Headings, MD;  Location: [facility] ENDOSCOPY;   Service: Cardiovascular;  Laterality: N/A;  . COLONOSCOPY W/ POLYPECTOMY    . INGUINAL HERNIA REPAIR  09/2009   Left  . KNEE ARTHROSCOPY      left x3  and right x2  . Melanoma Surgery     2001, 2005, 2006, 2009  . MITRAL VALVE REPAIR  10/01/2011   complex valvuloplasty with Goretex cord replacement and chordal transposition 74mm Sorin Memo 3D ring annuloplasty  . Nuclear Stress Test  09/2006   EF-64%, Normal  . PROSTATECTOMY  1993  . ROOT CANAL  08-19-12  . ROTATOR CUFF REPAIR  2003   left  . TEE WITHOUT CARDIOVERSION  09/26/2011   Procedure: TRANSESOPHAGEAL ECHOCARDIOGRAM (TEE);  Surgeon: Lelon Perla, MD;  Location: [facility] ENDOSCOPY;  Service: Cardiovascular;  Laterality: N/A;  . US ECHOCARDIOGRAPHY  09/2009, 08/1011   mild LVH,mild AI,MVP with mild MR, mild-mod. TR with mild Pulm. HTN, EF-55-60%    There were no vitals filed for this visit.      Subjective Assessment - 11/20/16 1436    Subjective "My legs aren't too good, shoulder hasn't changed much"   Currently in Pain? Yes   Pain Score 7    Pain Location Shoulder   Pain Orientation Right                         [facility] Adult PT Treatment/Exercise - 11/20/16 0001  Exercises   Exercises Shoulder     Shoulder Exercises: Seated   External Rotation 15 reps;10 reps;Theraband;Right   Theraband Level (Shoulder External Rotation) Level 1 (Yellow)   Internal Rotation Right;15 reps;Theraband  x2   Theraband Level (Shoulder Internal Rotation) Level 1 (Yellow)   Other Seated Exercises Tband Rows red 2x15; seated forward reaches with red ball 2x10   Other Seated Exercises R shoulder ext red tband 2x10; seated forward reaches red ball 2x10; RUE shoulder abduction 2x5     Shoulder Exercises: ROM/Strengthening   UBE (Upper Arm Bike) NuStep L4 x 9min                  PT Short Term Goals - 11/13/16 1500      PT SHORT TERM GOAL #1   Title independent with initial HEP   Time 2   Period Weeks    Status New           PT Long Term Goals - 11/13/16 1502      PT LONG TERM GOAL #1   Title understand proper posture and body mechanics   Time 8   Period Weeks   Status New     PT LONG TERM GOAL #2   Title increase right shoulder AROM to 130 degrees flexion   Time 8   Period Weeks   Status New     PT LONG TERM GOAL #3   Title decrease pain 50%   Time 8   Period Weeks   Status New     PT LONG TERM GOAL #4   Title increase right shoulder strength to 4-/5 for ER   Time 8   Period Weeks   Status New               Plan - 11/20/16 1514    Clinical Impression Statement Pt able to complete all of today's exercises fair. Attempt OHP with red ball but unable to complete due to pain. Some increase in pain with forward reaches and R shoulder abduction but only after muscles fatigue.    Rehab Potential Good   PT Frequency 2x / week   PT Duration 8 weeks   PT Treatment/Interventions ADLs/Self Care Home Management;Cryotherapy;Electrical Stimulation;Moist Heat;Ultrasound;Therapeutic activities;Therapeutic exercise;Balance training;Neuromuscular re-education;Manual techniques   PT Next Visit Plan slowly add exercises and stabilization of the scapula and RC mm      Patient will benefit from skilled therapeutic intervention in order to improve the following deficits and impairments:  Abnormal gait, Decreased activity tolerance, Decreased balance, Decreased mobility, Decreased range of motion, Decreased strength, Difficulty walking, Postural dysfunction, Pain  Visit Diagnosis: Acute pain of right shoulder  Stiffness of right shoulder, not elsewhere classified     Problem List Patient Active Problem List   Diagnosis Date Noted  . Varicose veins of right lower extremity with complications 0000000  . Obstructive lung disease (generalized) ([place]) 09/26/2016  . Carpal tunnel syndrome 06/25/2016  . Hereditary and idiopathic peripheral neuropathy 06/25/2016  . Paresthesia  05/27/2016  . Abnormality of gait 05/27/2016  . Chest x-ray abnormality 01/09/2016  . Paroxysmal atrial flutter ([place]) 12/05/2015  . Constipation 06/13/2015  . Encounter for therapeutic drug monitoring 05/25/2014  . Syncope 04/03/2014  . Rhabdomyolysis 04/03/2014  . UTI (urinary tract infection) 04/03/2014  . Acute renal failure ([place]) 04/03/2014  . Leukocytosis 04/03/2014  . Internal nasal lesion 05/15/2013  . Low back pain 04/19/2013  . Melanoma ([place]) 09/30/2012  . Cough 03/26/2012  . Hx of mitral  valve repair 11/19/2011  . Long term (current) use of anticoagulants 11/03/2011  . Decubitus skin ulcer 10/28/2011  . Pleural effusion due to congestive heart failure ([place]) 10/28/2011  . Atrial fibrillation ([place]) 10/07/2011  . S/P mitral valve repair 10/01/2011  . Valvular heart disease 08/21/2011  . Cerumen impaction 04/09/2011  . Hearing loss 04/09/2011  . THROMBOCYTOPENIA 09/19/2010  . ADENOCARCINOMA, PROSTATE 09/17/2010  . CALLUS, LEFT FOOT 09/17/2010  . BACK PAIN, CHRONIC 09/17/2010  . TINEA PEDIS 05/28/2009  . DERMATITIS, ATOPIC 04/10/2009  . HYPERCHOLESTEROLEMIA 06/09/2008  . DEPRESSION 06/09/2008  . HYPERTENSION, BENIGN ESSENTIAL 06/09/2008  . GERD 06/09/2008  . OSTEOARTHRITIS, GENERALIZED, MULTIPLE JOINTS 06/09/2008  . MUSCLE SPASM, BACK 06/09/2008  . PERSONAL HISTORY MALIGNANT NEOPLASM PROSTATE 06/09/2008  . PERSONAL HISTORY OF MALIGNANT MELANOMA OF SKIN 06/09/2008  . ARRHYTHMIA, HX OF 06/09/2008  . Personal history of colonic polyps 06/09/2008    Scot Jun 11/20/2016, 3:16 PM  [address] [place], [place], [address] Phone: [phone]   Fax:  [phone]  Name: [name] [name] MRN: [id number] Date of Birth: 1931/02/26

## 2016-11-25 ENCOUNTER — Ambulatory Visit (INDEPENDENT_AMBULATORY_CARE_PROVIDER_SITE_OTHER): Payer: Medicare Other | Admitting: Vascular Surgery

## 2016-11-25 ENCOUNTER — Encounter: Payer: Self-pay | Admitting: Vascular Surgery

## 2016-11-25 VITALS — BP 127/71 | HR 64 | Temp 98.0°F | Resp 16 | Ht 77.0 in | Wt 182.0 lb

## 2016-11-25 DIAGNOSIS — I83891 Varicose veins of right lower extremities with other complications: Secondary | ICD-10-CM

## 2016-11-25 NOTE — Progress Notes (Signed)
Laser Ablation Procedure    Date: 11/25/2016   [name] [name] DOB:07/16/1931  Consent signed: Yes    Surgeon:  Dr. Nelda Severe. Kellie Simmering  Procedure: Laser Ablation: right Greater Saphenous Vein  BP 127/71   Pulse 64   Temp 98 F (36.7 C)   Resp 16   Ht 6\' 5"  (1.956 m)   Wt 182 lb (82.6 kg)   SpO2 100%   BMI 21.58 kg/m   Tumescent Anesthesia: 450 cc 0.9% NaCl with 50 cc Lidocaine HCL with 1% Epi and 15 cc 8.4% NaHCO3  Local Anesthesia: 4 cc Lidocaine HCL and NaHCO3 (ratio 2:1)  Pulsed Mode: 15 watts, 556ms delay, 1.0 duration  Total Energy: 2348             Total Pulses:   157             Total Time: 2:36    Patient tolerated procedure well  Notes:   Description of Procedure:  After marking the course of the secondary varicosities, the patient was placed on the operating table in the supine position, and the right leg was prepped and draped in sterile fashion.   Local anesthetic was administered and under ultrasound guidance the saphenous vein was accessed with a micro needle and guide wire; then the mirco puncture sheath was placed.  A guide wire was inserted saphenofemoral junction , followed by a 5 french sheath.  The position of the sheath and then the laser fiber below the junction was confirmed using the ultrasound.  Tumescent anesthesia was administered along the course of the saphenous vein using ultrasound guidance. The patient was placed in Trendelenburg position and protective laser glasses were placed on patient and staff, and the laser was fired at 15 watts continuous mode advancing 1-30mm/second for a total of 2348 joules.     Steri strips were applied to the stab wounds and ABD pads and thigh high compression stockings were applied.  Ace wrap bandages were applied over the phlebectomy sites and at the top of the saphenofemoral junction. Blood loss was less than 15 cc.  The patient ambulated out of the operating room having tolerated the procedure well.

## 2016-11-25 NOTE — Progress Notes (Signed)
Subjective:     Patient ID: [name] [name], male   DOB: [birth year], 83 y.o.   MRN: [id number]  HPI This 83 year old male had laser ablation of the right great saphenous vein from the proximal calf to near the saphenofemoral junction performed under local tumescent anesthesia. A total of approximately 2300 J of energy was utilized. He tolerated the procedure well. Review of Systems     Objective:   Physical Exam BP 127/71   Pulse 64   Temp 98 F (36.7 C)   Resp 16   Ht 6\' 5"  (1.956 m)   Wt 182 lb (82.6 kg)   SpO2 100%   BMI 21.58 kg/m        Assessment:     Well-tolerated laser ablation right great saphenous vein performed under local tumescent anesthesia    Plan:     #1 resume Coumadin therapy tomorrow #2 return in 1 week for venous duplex exam to confirm closure right great saphenous vein

## 2016-11-27 ENCOUNTER — Ambulatory Visit: Payer: Medicare Other | Admitting: Physical Therapy

## 2016-11-27 ENCOUNTER — Encounter: Payer: Self-pay | Admitting: Vascular Surgery

## 2016-12-02 ENCOUNTER — Ambulatory Visit (HOSPITAL_COMMUNITY)
Admission: RE | Admit: 2016-12-02 | Discharge: 2016-12-02 | Disposition: A | Discharge status: Home / Self Care | Payer: Medicare Other | Source: Ambulatory Visit | Attending: Vascular Surgery | Admitting: Vascular Surgery

## 2016-12-02 ENCOUNTER — Ambulatory Visit (INDEPENDENT_AMBULATORY_CARE_PROVIDER_SITE_OTHER): Payer: Medicare Other | Admitting: Vascular Surgery

## 2016-12-02 ENCOUNTER — Ambulatory Visit (INDEPENDENT_AMBULATORY_CARE_PROVIDER_SITE_OTHER): Payer: Medicare Other | Admitting: *Deleted

## 2016-12-02 VITALS — BP 140/76 | HR 61 | Temp 96.8°F | Resp 18 | Ht 77.0 in | Wt 182.0 lb

## 2016-12-02 DIAGNOSIS — I4891 Unspecified atrial fibrillation: Secondary | ICD-10-CM | POA: Diagnosis not present

## 2016-12-02 DIAGNOSIS — I8391 Asymptomatic varicose veins of right lower extremity: Secondary | ICD-10-CM | POA: Insufficient documentation

## 2016-12-02 DIAGNOSIS — Z5181 Encounter for therapeutic drug level monitoring: Secondary | ICD-10-CM | POA: Diagnosis not present

## 2016-12-02 DIAGNOSIS — Z7901 Long term (current) use of anticoagulants: Secondary | ICD-10-CM | POA: Diagnosis not present

## 2016-12-02 DIAGNOSIS — I83811 Varicose veins of right lower extremities with pain: Secondary | ICD-10-CM

## 2016-12-02 DIAGNOSIS — I83891 Varicose veins of right lower extremities with other complications: Secondary | ICD-10-CM

## 2016-12-02 LAB — POCT INR: INR: 1.7

## 2016-12-02 NOTE — Progress Notes (Signed)
Subjective:     Patient ID: [name] [name], male   DOB: 1930/12/29, 83 y.o.   MRN: [id number]  HPI this 83 year old male returns 1 week post-laser ablation right great saphenous vein from the proximal calf to near the saphenofemoral junction for gross reflux with pain and swelling in the right leg. He has a history of atrial fibrillation and resumed his Coumadin the day  following the procedure. He also has a history of mitral valve repair. He states that he had no pain in the right leg following the procedure and took no analgesics. He did state that the swelling in the lower portion of the right leg is significantly less first thing in the morning but it does worsen as the day progresses.  Past Medical History:  Diagnosis Date  . Abnormality of gait 05/27/2016  . Adenomatous polyps   . Carpal tunnel syndrome 06/25/2016   Right  . Depressive disorder, not elsewhere classified   . Hereditary and idiopathic peripheral neuropathy 06/25/2016  . Hypertension   . Internal nasal lesion 05/15/2013  . Melanoma ([place])    Left Shoulder  . Mitral regurgitation   . MVP (mitral valve prolapse)    a. With severe MR s/p Complex valvuloplasty including artificial Gore-tex neochord placement x4, chordal transposition x1, chordal release x1, # 32 mm Sorin Memo 3D Ring Annuloplasty 2012.  Marland Kitchen Neuropathy ([place])   . Normal coronary arteries    a. Normal coronary anatomy by cath 2012.  . Osteoarthritis    Knees  . PAF (paroxysmal atrial fibrillation) ([place])    a. Post-op MVR 2012.  Marland Kitchen Personal history of colonic polyps   . Prostate cancer ([place])   . Pulmonary HTN    a. Mild-mod by cath 2012.  . Pure hypercholesterolemia   . PVC (premature ventricular contraction)   . Thrombocytopenia ([place])   . Vision abnormalities    Cornea scarring    Social History  Substance Use Topics  . Smoking status: Never Smoker  . Smokeless tobacco: Never Used  . Alcohol use No     Comment: Last drink in 2000    Family History   Problem Relation Age of On[place]  . Clotting disorder Brother     CVA's  . Arthritis Mother   . Hypertension Mother   . Stroke Mother   . Hypertension Father   . Psychosis Father     psychiatric care  . Colon cancer Neg Hx   . Stomach cancer Neg Hx   . Heart attack Neg Hx     No Known Allergies   Current Outpatient Prescriptions:  .  amiodarone (PACERONE) 200 MG tablet, Take 200 mg by mouth daily. , Disp: , Rfl:  .  CARTIA XT 300 MG 24 hr capsule, Take 300 mg by mouth daily. , Disp: , Rfl:  .  furosemide (LASIX) 40 MG tablet, Take 40 mg by mouth every other day. , Disp: , Rfl:  .  gabapentin (NEURONTIN) 300 MG capsule, Take 300 mg by mouth 2 (two) times daily. , Disp: , Rfl:  .  potassium chloride SA (K-DUR,KLOR-CON) 20 MEQ tablet, Take 1 tablet (20 mEq total) by mouth daily., Disp: 90 tablet, Rfl: 2 .  rosuvastatin (CRESTOR) 5 MG tablet, Take 5 mg by mouth daily at 6 PM. , Disp: , Rfl:  .  TOPROL XL 50 MG 24 hr tablet, Take 50 mg by mouth daily. , Disp: , Rfl:  .  traZODone (DESYREL) 100 MG tablet, Take 300 mg by  mouth at bedtime. , Disp: , Rfl:  .  vitamin C (ASCORBIC ACID) 500 MG tablet, Take 500 mg by mouth 2 (two) times daily., Disp: , Rfl:  .  warfarin (COUMADIN) 5 MG tablet, Take 1 tablet (5 mg total) by mouth daily. Take by mouth daily as directed by the coumadin clinic, Disp: 25 tablet, Rfl: 3  Vitals:   12/02/16 1021  BP: 140/76  Pulse: 61  Resp: 18  Temp: (!) 96.8 F (36 C)  Weight: 182 lb (82.6 kg)  Height: 6\' 5"  (1.956 m)    Body mass index is 21.58 kg/m.        Review of Systems Denies chest pain, dyspnea on exertion, PND, orthopnea, hemoptysis, claudication. Has resume Coumadin for his paroxysmal atrial fibrillation. Prothrombin time to be checked today    Objective:   Physical Exam BP 140/76 (BP Location: Left Arm, Patient Position: Sitting, Cuff Size: Normal)   Pulse 61   Temp (!) 96.8 F (36 C)   Resp 18   Ht 6\' 5"  (1.956 m)   Wt 182 lb  (82.6 kg)   BMI 21.58 kg/m   Gen. well-developed well-nourished thin male in no apparent distress alert and oriented 3 Lungs no rhonchi or wheezing Cardiovascular regular rhythm no murmur Exline right leg with mild discomfort to deep palpation over great saphenous vein in the proximal thigh. 1+ distal edema. No ulceration noted. 3+ dorsalis pedis pulse palpable.  Today I ordered a venous duplex exam of the right leg which I reviewed and interpreted. There is no DVT. 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     Assessment:     Successful laser ablation right great saphenous vein for gross reflux with pain and swelling-improvement in edema early in the day-    Plan:     Patient will continue to wear elastic compression stockings to be placed on early in the morning Return to see me on a when necessary basis

## 2016-12-03 ENCOUNTER — Ambulatory Visit: Payer: Medicare Other | Admitting: Physical Therapy

## 2016-12-07 ENCOUNTER — Other Ambulatory Visit: Payer: Self-pay | Admitting: Interventional Cardiology

## 2016-12-08 ENCOUNTER — Encounter: Payer: Self-pay | Admitting: Physical Therapy

## 2016-12-08 ENCOUNTER — Ambulatory Visit: Payer: Medicare Other | Admitting: Physical Therapy

## 2016-12-08 DIAGNOSIS — M25511 Pain in right shoulder: Secondary | ICD-10-CM

## 2016-12-08 DIAGNOSIS — R262 Difficulty in walking, not elsewhere classified: Secondary | ICD-10-CM

## 2016-12-08 DIAGNOSIS — M25611 Stiffness of right shoulder, not elsewhere classified: Secondary | ICD-10-CM

## 2016-12-08 NOTE — Therapy (Signed)
[place], [place], [address] Phone: [phone]   Fax:  [phone]  Physical Therapy Treatment  Patient Details  Name: [name] [name] MRN: [id number] Date of Birth: [birth year] Referring Provider: Durward Fortes  Encounter Date: 12/08/2016      PT End of Session - 12/08/16 1051    Visit Number 3   Date for PT Re-Evaluation 01/14/17   PT Start Time [id number]   PT Stop Time 1045   PT Time Calculation (min) 30 min   Activity Tolerance Patient tolerated treatment well;Patient limited by pain   Behavior During Therapy [facility] for tasks assessed/performed      Past Medical History:  Diagnosis Date  . Abnormality of gait 05/27/2016  . Adenomatous polyps   . Carpal tunnel syndrome 06/25/2016   Right  . Depressive disorder, not elsewhere classified   . Hereditary and idiopathic peripheral neuropathy 06/25/2016  . Hypertension   . Internal nasal lesion 05/15/2013  . Melanoma ([place])    Left Shoulder  . Mitral regurgitation   . MVP (mitral valve prolapse)    a. With severe MR s/p Complex valvuloplasty including artificial Gore-tex neochord placement x4, chordal transposition x1, chordal release x1, # 32 mm Sorin Memo 3D Ring Annuloplasty 2012.  Marland Kitchen Neuropathy ([place])   . Normal coronary arteries    a. Normal coronary anatomy by cath 2012.  . Osteoarthritis    Knees  . PAF (paroxysmal atrial fibrillation) ([place])    a. Post-op MVR 2012.  Marland Kitchen Personal history of colonic polyps   . Prostate cancer ([place])   . Pulmonary HTN    a. Mild-mod by cath 2012.  . Pure hypercholesterolemia   . PVC (premature ventricular contraction)   . Thrombocytopenia ([place])   . Vision abnormalities    Cornea scarring    Past Surgical History:  Procedure Laterality Date  . CARDIAC CATHETERIZATION  09/2011   Pre-op for MVR -- normal coronaries.  Marland Kitchen CARDIOVERSION N/A 01/02/2016   Procedure: CARDIOVERSION;  Surgeon: Thayer Headings, MD;   Location: [facility] ENDOSCOPY;  Service: Cardiovascular;  Laterality: N/A;  . COLONOSCOPY W/ POLYPECTOMY    . INGUINAL HERNIA REPAIR  09/2009   Left  . KNEE ARTHROSCOPY      left x3  and right x2  . Melanoma Surgery     2001, 2005, 2006, 2009  . MITRAL VALVE REPAIR  10/01/2011   complex valvuloplasty with Goretex cord replacement and chordal transposition 71mm Sorin Memo 3D ring annuloplasty  . Nuclear Stress Test  09/2006   EF-64%, Normal  . PROSTATECTOMY  1993  . ROOT CANAL  08-19-12  . ROTATOR CUFF REPAIR  2003   left  . TEE WITHOUT CARDIOVERSION  09/26/2011   Procedure: TRANSESOPHAGEAL ECHOCARDIOGRAM (TEE);  Surgeon: Lelon Perla, MD;  Location: [facility] ENDOSCOPY;  Service: Cardiovascular;  Laterality: N/A;  . US ECHOCARDIOGRAPHY  09/2009, 08/1011   mild LVH,mild AI,MVP with mild MR, mild-mod. TR with mild Pulm. HTN, EF-55-60%    There were no vitals filed for this visit.      Subjective Assessment - 12/08/16 1017    Subjective Shoulder and knee pain. Feels like its getting better.   Currently in Pain? Yes   Pain Score 7    Pain Location Shoulder   Pain Orientation Right   Aggravating Factors  hurts worse at night when sleeping.  [facility] Adult PT Treatment/Exercise - 12/08/16 0001      Exercises   Exercises Hand     Shoulder Exercises: Seated   Extension Strengthening;AROM;Both;15 reps;Theraband  2 sets   Theraband Level (Shoulder Extension) Level 1 (Yellow)   Row Strengthening;AROM;Both;15 reps;Theraband  2 sets   Theraband Level (Shoulder Row) Level 1 ([place]ellow)   External Rotation AROM;Strengthening;Right;10 reps  2 sets    Theraband Level (Shoulder External Rotation) Level 1 ([place]ellow)   Internal Rotation Right;AROM;15 reps  2 sets   Theraband Level (Shoulder Internal Rotation) Level 1 (Yellow)   Other Seated Exercises seated punch outs 2x15, seated shoulder abd. 2x10   Other Seated Exercises seated chest press x10     Shoulder  Exercises: ROM/Strengthening   UBE (Upper Arm Bike) NuStep L4 x 47min                  PT Short Term Goals - 11/13/16 1500      PT SHORT TERM GOAL #1   Title independent with initial HEP   Time 2   Period Weeks   Status New           PT Long Term Goals - 11/13/16 1502      PT LONG TERM GOAL #1   Title understand proper posture and body mechanics   Time 8   Period Weeks   Status New     PT LONG TERM GOAL #2   Title increase right shoulder AROM to 130 degrees flexion   Time 8   Period Weeks   Status New     PT LONG TERM GOAL #3   Title decrease pain 50%   Time 8   Period Weeks   Status New     PT LONG TERM GOAL #4   Title increase right shoulder strength to 4-/5 for ER   Time 8   Period Weeks   Status New               Plan - 12/08/16 1052    Clinical Impression Statement Pt had pain with all exercises except shoulder IR/rows/extension. With seated flexion patient was estimated only being able to go to 70 degrees before pain occurred but completed both sets. With seated punch outs patient could only do an estimated 70 degrees and cold only do 15. Patient was limited by pain in extreme ranges of shoulder ER/abd/flex. Patient need min. VC on correct posture and technique and all exercises were seated due to knee pain.   Rehab Potential Good   PT Frequency 2x / week   PT Duration 8 weeks   PT Treatment/Interventions ADLs/Self Care Home Management;Cryotherapy;Electrical Stimulation;Moist Heat;Ultrasound;Therapeutic activities;Therapeutic exercise;Balance training;Neuromuscular re-education;Manual techniques   PT Next Visit Plan increase strength below 90 degrees in shoulder flex./abd., along with scapula stabilization exercises.      Patient will benefit from skilled therapeutic intervention in order to improve the following deficits and impairments:  Abnormal gait, Decreased activity tolerance, Decreased balance, Decreased mobility, Decreased range  of motion, Decreased strength, Difficulty walking, Postural dysfunction, Pain  Visit Diagnosis: Acute pain of right shoulder  Stiffness of right shoulder, not elsewhere classified  Difficulty in walking, not elsewhere classified     Problem List Patient Active Problem List   Diagnosis Date Noted  . Varicose veins of right lower extremity with complications 0000000  . Obstructive lung disease (generalized) ([place]) 09/26/2016  . Carpal tunnel syndrome 06/25/2016  . Hereditary and idiopathic peripheral neuropathy 06/25/2016  . Paresthesia 05/27/2016  .  Abnormality of gait 05/27/2016  . Chest x-ray abnormality 01/09/2016  . Paroxysmal atrial flutter ([place]) 12/05/2015  . Constipation 06/13/2015  . Encounter for therapeutic drug monitoring 05/25/2014  . Syncope 04/03/2014  . Rhabdomyolysis 04/03/2014  . UTI (urinary tract infection) 04/03/2014  . Acute renal failure ([place]) 04/03/2014  . Leukocytosis 04/03/2014  . Internal nasal lesion 05/15/2013  . Low back pain 04/19/2013  . Melanoma ([place]) 09/30/2012  . Cough 03/26/2012  . Hx of mitral valve repair 11/19/2011  . Long term (current) use of anticoagulants 11/03/2011  . Decubitus skin ulcer 10/28/2011  . Pleural effusion due to congestive heart failure ([place]) 10/28/2011  . Atrial fibrillation ([place]) 10/07/2011  . S/P mitral valve repair 10/01/2011  . Valvular heart disease 08/21/2011  . Cerumen impaction 04/09/2011  . Hearing loss 04/09/2011  . THROMBOCYTOPENIA 09/19/2010  . ADENOCARCINOMA, PROSTATE 09/17/2010  . CALLUS, LEFT FOOT 09/17/2010  . BACK PAIN, CHRONIC 09/17/2010  . TINEA PEDIS 05/28/2009  . DERMATITIS, ATOPIC 04/10/2009  . HYPERCHOLESTEROLEMIA 06/09/2008  . DEPRESSION 06/09/2008  . HYPERTENSION, BENIGN ESSENTIAL 06/09/2008  . GERD 06/09/2008  . OSTEOARTHRITIS, GENERALIZED, MULTIPLE JOINTS 06/09/2008  . MUSCLE SPASM, BACK 06/09/2008  . PERSONAL HISTORY MALIGNANT NEOPLASM PROSTATE 06/09/2008  . PERSONAL HISTORY  OF MALIGNANT MELANOMA OF SKIN 06/09/2008  . ARRHYTHMIA, HX OF 06/09/2008  . Personal history of colonic polyps 06/09/2008    Devoria Al[place], [place] 12/08/2016, 11:00 AM  [address] [place], [place], [address] Phone: [phone]   Fax:  [phone]  Name: DEZMAN SCHOENING MRN: [id number] Date of Birth: 06-Apr-1931

## 2016-12-15 ENCOUNTER — Ambulatory Visit: Payer: Medicare Other | Admitting: Physical Therapy

## 2016-12-15 ENCOUNTER — Encounter: Payer: Self-pay | Admitting: Physical Therapy

## 2016-12-15 DIAGNOSIS — M25611 Stiffness of right shoulder, not elsewhere classified: Secondary | ICD-10-CM

## 2016-12-15 DIAGNOSIS — M25511 Pain in right shoulder: Secondary | ICD-10-CM

## 2016-12-15 NOTE — Therapy (Signed)
[place], [place], [address] Phone: [phone]   Fax:  [phone]  Physical Therapy Treatment  Patient Details  Name: [name] [name] MRN: [id number] Date of Birth: 01-Mar-1931 Referring Provider: Durward Fortes  Encounter Date: 12/15/2016      PT End of Session - 12/15/16 1141    Visit Number 4   Date for PT Re-Evaluation 01/14/17   PT Start Time 1100   PT Stop Time 1149   PT Time Calculation (min) 49 min   Activity Tolerance Patient tolerated treatment well;Patient limited by pain   Behavior During Therapy [facility] for tasks assessed/performed      Past Medical History:  Diagnosis Date  . Abnormality of gait 05/27/2016  . Adenomatous polyps   . Carpal tunnel syndrome 06/25/2016   Right  . Depressive disorder, not elsewhere classified   . Hereditary and idiopathic peripheral neuropathy 06/25/2016  . Hypertension   . Internal nasal lesion 05/15/2013  . Melanoma ([place])    Left Shoulder  . Mitral regurgitation   . MVP (mitral valve prolapse)    a. With severe MR s/p Complex valvuloplasty including artificial Gore-tex neochord placement x4, chordal transposition x1, chordal release x1, # 32 mm Sorin Memo 3D Ring Annuloplasty 2012.  Marland Kitchen Neuropathy ([place])   . Normal coronary arteries    a. Normal coronary anatomy by cath 2012.  . Osteoarthritis    Knees  . PAF (paroxysmal atrial fibrillation) ([place])    a. Post-op MVR 2012.  Marland Kitchen Personal history of colonic polyps   . Prostate cancer ([place])   . Pulmonary HTN    a. Mild-mod by cath 2012.  . Pure hypercholesterolemia   . PVC (premature ventricular contraction)   . Thrombocytopenia ([place])   . Vision abnormalities    Cornea scarring    Past Surgical History:  Procedure Laterality Date  . CARDIAC CATHETERIZATION  09/2011   Pre-op for MVR -- normal coronaries.  Marland Kitchen CARDIOVERSION N/A 01/02/2016   Procedure: CARDIOVERSION;  Surgeon: Thayer Headings, MD;   Location: [facility] ENDOSCOPY;  Service: Cardiovascular;  Laterality: N/A;  . COLONOSCOPY W/ POLYPECTOMY    . INGUINAL HERNIA REPAIR  09/2009   Left  . KNEE ARTHROSCOPY      left x3  and right x2  . Melanoma Surgery     2001, 2005, 2006, 2009  . MITRAL VALVE REPAIR  10/01/2011   complex valvuloplasty with Goretex cord replacement and chordal transposition 60mm Sorin Memo 3D ring annuloplasty  . Nuclear Stress Test  09/2006   EF-64%, Normal  . PROSTATECTOMY  1993  . ROOT CANAL  08-19-12  . ROTATOR CUFF REPAIR  2003   left  . TEE WITHOUT CARDIOVERSION  09/26/2011   Procedure: TRANSESOPHAGEAL ECHOCARDIOGRAM (TEE);  Surgeon: Lelon Perla, MD;  Location: [facility] ENDOSCOPY;  Service: Cardiovascular;  Laterality: N/A;  . US ECHOCARDIOGRAPHY  09/2009, 08/1011   mild LVH,mild AI,MVP with mild MR, mild-mod. TR with mild Pulm. HTN, EF-55-60%    There were no vitals filed for this visit.      Subjective Assessment - 12/15/16 1102    Subjective Not one of his better mornings, his shoulder hurts and had trouble getting going this morning.    Currently in Pain? Yes   Pain Score 7    Pain Location Shoulder   Pain Orientation Right  [place] Adult PT Treatment/Exercise - 12/15/16 0001      Shoulder Exercises: Seated   Elevation AROM;Strengthening;Both;20 reps;Weights  2#   Extension Strengthening;AROM;Both;15 reps;Theraband   Retraction AROM;Strengthening;Both;20 reps   Row Strengthening;AROM;Both;15 reps;Theraband   External Rotation AROM;Strengthening;Right;10 reps   Theraband Level (Shoulder External Rotation) Level 1 (Yellow)   Internal Rotation AROM;Right;20 reps;Theraband   Theraband Level (Shoulder Internal Rotation) Level 1 (Yellow)   Flexion AROM;Both;20 reps;Theraband   Theraband Level (Shoulder Flexion) Level 1 (Yellow)   Other Seated Exercises seated punch outs 2x15, seated shoulder abd. 2x10   Other Seated Exercises PNF D2 flexion 2x10 yellow  theraband, red ball taps with left arm     Shoulder Exercises: ROM/Strengthening   UBE (Upper Arm Bike) NuStep L4 x 34min     Moist Heat Therapy   Number Minutes Moist Heat 15 Minutes   Moist Heat Location Shoulder                  PT Short Term Goals - 11/13/16 1500      PT SHORT TERM GOAL #1   Title independent with initial HEP   Time 2   Period Weeks   Status New           PT Long Term Goals - 11/13/16 1502      PT LONG TERM GOAL #1   Title understand proper posture and body mechanics   Time 8   Period Weeks   Status New     PT LONG TERM GOAL #2   Title increase right shoulder AROM to 130 degrees flexion   Time 8   Period Weeks   Status New     PT LONG TERM GOAL #3   Title decrease pain 50%   Time 8   Period Weeks   Status New     PT LONG TERM GOAL #4   Title increase right shoulder strength to 4-/5 for ER   Time 8   Period Weeks   Status New               Plan - 12/15/16 1142    Clinical Impression Statement Patient was limited by pain in all exercises <90 degrees. Patient needed mod. VC on correct technique and posture. Patient is trying MHP today to see if that alleviates pain and if it helps will start doing it at home.   Rehab Potential Good   PT Frequency 2x / week   PT Duration 8 weeks   PT Treatment/Interventions ADLs/Self Care Home Management;Cryotherapy;Electrical Stimulation;Moist Heat;Ultrasound;Therapeutic activities;Therapeutic exercise;Balance training;Neuromuscular re-education;Manual techniques   PT Next Visit Plan increase strength below 90 degrees in shoulder flex./abd., along with scapula stabilization exercises.      Patient will benefit from skilled therapeutic intervention in order to improve the following deficits and impairments:  Abnormal gait, Decreased activity tolerance, Decreased balance, Decreased mobility, Decreased range of motion, Decreased strength, Difficulty walking, Postural dysfunction,  Pain  Visit Diagnosis: Acute pain of right shoulder  Stiffness of right shoulder, not elsewhere classified     Problem List Patient Active Problem List   Diagnosis Date Noted  . Varicose veins of right lower extremity with complications 0000000  . Obstructive lung disease (generalized) ([place]) 09/26/2016  . Carpal tunnel syndrome 06/25/2016  . Hereditary and idiopathic peripheral neuropathy 06/25/2016  . Paresthesia 05/27/2016  . Abnormality of gait 05/27/2016  . Chest x-ray abnormality 01/09/2016  . Paroxysmal atrial flutter ([place]) 12/05/2015  . Constipation 06/13/2015  . Encounter for therapeutic drug  monitoring 05/25/2014  . Syncope 04/03/2014  . Rhabdomyolysis 04/03/2014  . UTI (urinary tract infection) 04/03/2014  . Acute renal failure ([place]) 04/03/2014  . Leukocytosis 04/03/2014  . Internal nasal lesion 05/15/2013  . Low back pain 04/19/2013  . Melanoma ([place]) 09/30/2012  . Cough 03/26/2012  . Hx of mitral valve repair 11/19/2011  . Long term (current) use of anticoagulants 11/03/2011  . Decubitus skin ulcer 10/28/2011  . Pleural effusion due to congestive heart failure ([place]) 10/28/2011  . Atrial fibrillation ([place]) 10/07/2011  . S/P mitral valve repair 10/01/2011  . Valvular heart disease 08/21/2011  . Cerumen impaction 04/09/2011  . Hearing loss 04/09/2011  . THROMBOCYTOPENIA 09/19/2010  . ADENOCARCINOMA, PROSTATE 09/17/2010  . CALLUS, LEFT FOOT 09/17/2010  . BACK PAIN, CHRONIC 09/17/2010  . TINEA PEDIS 05/28/2009  . DERMATITIS, ATOPIC 04/10/2009  . HYPERCHOLESTEROLEMIA 06/09/2008  . DEPRESSION 06/09/2008  . HYPERTENSION, BENIGN ESSENTIAL 06/09/2008  . GERD 06/09/2008  . OSTEOARTHRITIS, GENERALIZED, MULTIPLE JOINTS 06/09/2008  . MUSCLE SPASM, BACK 06/09/2008  . PERSONAL HISTORY MALIGNANT NEOPLASM PROSTATE 06/09/2008  . PERSONAL HISTORY OF MALIGNANT MELANOMA OF SKIN 06/09/2008  . ARRHYTHMIA, HX OF 06/09/2008  . Personal history of colonic polyps 06/09/2008     Dev[place], [place] 12/15/2016, 11:46 AM  [place], [place], [address] Phone: [phone]   Fax:  [phone]  Name: [name] [name] MRN: [id number] Date of Birth: 1931-06-19

## 2016-12-16 ENCOUNTER — Ambulatory Visit (INDEPENDENT_AMBULATORY_CARE_PROVIDER_SITE_OTHER): Payer: Medicare Other | Admitting: *Deleted

## 2016-12-16 DIAGNOSIS — Z5181 Encounter for therapeutic drug level monitoring: Secondary | ICD-10-CM

## 2016-12-16 DIAGNOSIS — I4891 Unspecified atrial fibrillation: Secondary | ICD-10-CM | POA: Diagnosis not present

## 2016-12-16 DIAGNOSIS — Z7901 Long term (current) use of anticoagulants: Secondary | ICD-10-CM | POA: Diagnosis not present

## 2016-12-16 LAB — POCT INR: INR: 2.1

## 2016-12-19 ENCOUNTER — Other Ambulatory Visit: Payer: Self-pay

## 2016-12-19 ENCOUNTER — Ambulatory Visit (HOSPITAL_COMMUNITY): Payer: Medicare Other | Attending: Cardiovascular Disease

## 2016-12-19 ENCOUNTER — Telehealth: Payer: Self-pay | Admitting: *Deleted

## 2016-12-19 ENCOUNTER — Encounter: Payer: Self-pay | Admitting: Physician Assistant

## 2016-12-19 ENCOUNTER — Encounter (INDEPENDENT_AMBULATORY_CARE_PROVIDER_SITE_OTHER): Payer: Self-pay

## 2016-12-19 DIAGNOSIS — I480 Paroxysmal atrial fibrillation: Secondary | ICD-10-CM | POA: Insufficient documentation

## 2016-12-19 DIAGNOSIS — I071 Rheumatic tricuspid insufficiency: Secondary | ICD-10-CM | POA: Diagnosis not present

## 2016-12-19 DIAGNOSIS — Z4509 Encounter for adjustment and management of other cardiac device: Secondary | ICD-10-CM | POA: Insufficient documentation

## 2016-12-19 DIAGNOSIS — I272 Pulmonary hypertension, unspecified: Secondary | ICD-10-CM | POA: Insufficient documentation

## 2016-12-19 DIAGNOSIS — I5042 Chronic combined systolic (congestive) and diastolic (congestive) heart failure: Secondary | ICD-10-CM | POA: Diagnosis not present

## 2016-12-19 DIAGNOSIS — Z9889 Other specified postprocedural states: Secondary | ICD-10-CM | POA: Diagnosis present

## 2016-12-19 DIAGNOSIS — I371 Nonrheumatic pulmonary valve insufficiency: Secondary | ICD-10-CM | POA: Insufficient documentation

## 2016-12-19 DIAGNOSIS — I10 Essential (primary) hypertension: Secondary | ICD-10-CM | POA: Insufficient documentation

## 2016-12-19 DIAGNOSIS — E7800 Pure hypercholesterolemia, unspecified: Secondary | ICD-10-CM | POA: Diagnosis not present

## 2016-12-19 LAB — ECHOCARDIOGRAM COMPLETE
AVLVOTPG: 2 mmHg
Area-P 1/2: 2.2 cm2
CHL CUP DOP CALC LVOT VTI: 17.5 cm
CHL CUP LVOT MV VTI INDEX: 0.84 cm2/m2
CHL CUP LVOT MV VTI: 1.81
CHL CUP MV DEC (S): 254
CHL CUP TV REG PEAK VELOCITY: 255 cm/s
E decel time: 254 msec
E/e' ratio: 10.54
FS: 32 % (ref 28–44)
IVS/LV PW RATIO, ED: 1.04
LA ID, A-P, ES: 43 mm
LA diam end sys: 43 mm
LA vol index: 43.7 mL/m2
LADIAMINDEX: 2 cm/m2
LAVOL: 94 mL
LAVOLA4C: 90 mL
LV E/e'average: 10.54
LV TDI E'LATERAL: 11.1
LVEEMED: 10.54
LVELAT: 11.1 cm/s
LVOT area: 4.52 cm2
LVOT diameter: 24 mm
LVOT peak vel: 78.2 cm/s
LVOTSV: 79 mL
Lateral S' vel: 12.8 cm/s
MV Annulus VTI: 43.6 cm
MV M vel: 95.8
MV pk A vel: 107 m/s
MV pk E vel: 117 m/s
MVG: 4 mmHg
MVPG: 5 mmHg
P 1/2 time: 136 ms
P 1/2 time: 575 ms
PW: 11.4 mm — AB (ref 0.6–1.1)
TDI e' medial: 7.02
TRMAXVEL: 255 cm/s

## 2016-12-19 NOTE — Telephone Encounter (Signed)
Pt notified of echo results by phone with verbal understanding to plan of care to continue on current Tx plan. Pt was thankful for the good news.

## 2016-12-23 ENCOUNTER — Ambulatory Visit: Payer: Medicare Other | Attending: Orthopaedic Surgery | Admitting: Physical Therapy

## 2016-12-23 ENCOUNTER — Encounter: Payer: Self-pay | Admitting: Physical Therapy

## 2016-12-23 DIAGNOSIS — M25611 Stiffness of right shoulder, not elsewhere classified: Secondary | ICD-10-CM | POA: Diagnosis present

## 2016-12-23 DIAGNOSIS — M25511 Pain in right shoulder: Secondary | ICD-10-CM | POA: Diagnosis present

## 2016-12-23 NOTE — Therapy (Signed)
[place], [place], [address] Phone: [phone]   Fax:  [phone]  Physical Therapy Treatment  Patient Details  Name: [name] [name] MRN: [id number] Date of Birth: 18-Oct-1931 Referring Provider: Durward Fortes  Encounter Date: 12/23/2016      PT End of Session - 12/23/16 1345    Visit Number 5   Date for PT Re-Evaluation 01/14/17   PT Start Time 1303   PT Stop Time 1354   PT Time Calculation (min) 51 min   Activity Tolerance Patient tolerated treatment well   Behavior During Therapy [facility] for tasks assessed/performed      Past Medical History:  Diagnosis Date  . Abnormality of gait 05/27/2016  . Adenomatous polyps   . Carpal tunnel syndrome 06/25/2016   Right  . Depressive disorder, not elsewhere classified   . Hereditary and idiopathic peripheral neuropathy 06/25/2016  . Hypertension   . Internal nasal lesion 05/15/2013  . Melanoma ([place])    Left Shoulder  . Mitral regurgitation   . MVP (mitral valve prolapse)    a. With severe MR s/p Complex valvuloplasty including artificial Gore-tex neochord placement x4, chordal transposition x1, chordal release x1, # 32 mm Sorin Memo 3D Ring Annuloplasty 2012. // b. Echo 2/18: mild LVH, EF 50-55, mild AI, MV repair with mild MR, mod LAE, mod RVE, severe RAE, severe TR  . Neuropathy ([place])   . Normal coronary arteries    a. Normal coronary anatomy by cath 2012.  . Osteoarthritis    Knees  . PAF (paroxysmal atrial fibrillation) ([place])    a. Post-op MVR 2012.  Marland Kitchen Personal history of colonic polyps   . Prostate cancer ([place])   . Pulmonary HTN    a. Mild-mod by cath 2012.  . Pure hypercholesterolemia   . PVC (premature ventricular contraction)   . Thrombocytopenia ([place])   . Vision abnormalities    Cornea scarring    Past Surgical History:  Procedure Laterality Date  . CARDIAC CATHETERIZATION  09/2011   Pre-op for MVR -- normal coronaries.  Marland Kitchen  CARDIOVERSION N/A 01/02/2016   Procedure: CARDIOVERSION;  Surgeon: Thayer Headings, MD;  Location: [facility] ENDOSCOPY;  Service: Cardiovascular;  Laterality: N/A;  . COLONOSCOPY W/ POLYPECTOMY    . INGUINAL HERNIA REPAIR  09/2009   Left  . KNEE ARTHROSCOPY      left x3  and right x2  . Melanoma Surgery     2001, 2005, 2006, 2009  . MITRAL VALVE REPAIR  10/01/2011   complex valvuloplasty with Goretex cord replacement and chordal transposition 18m Sorin Memo 3D ring annuloplasty  . Nuclear Stress Test  09/2006   EF-64%, Normal  . PROSTATECTOMY  1993  . ROOT CANAL  08-19-12  . ROTATOR CUFF REPAIR  2003   left  . TEE WITHOUT CARDIOVERSION  09/26/2011   Procedure: TRANSESOPHAGEAL ECHOCARDIOGRAM (TEE);  Surgeon: BLelon Perla MD;  [facility];  Service: Cardiovascular;  Laterality: N/A;  . UKoreaECHOCARDIOGRAPHY  09/2009, 08/1011   mild LVH,mild AI,MVP with mild MR, mild-mod. TR with mild Pulm. HTN, EF-55-60%    There were no vitals filed for this visit.      Subjective Assessment - 12/23/16 1304    Subjective "Sometimes it feels better sometimes it doesn't"   Currently in Pain? Yes   Pain Location Shoulder   Pain Orientation Right  [facility] Adult PT Treatment/Exercise - 12/23/16 0001      Shoulder Exercises: Seated   Row Strengthening;AROM;Both;15 reps;Theraband  x2   Theraband Level (Shoulder Row) Other (comment)  level 5 (Black)   External Rotation AROM;Strengthening;Right;15 reps;Theraband  x2   Theraband Level (Shoulder External Rotation) Level 1 (Yellow)   Internal Rotation 15 reps;Theraband  x2   Theraband Level (Shoulder Internal Rotation) Level 1 (Yellow)   Flexion Both;10 reps;AROM  x2   Other Seated Exercises shrugs 6lb 2x15    Other Seated Exercises RUE ext green band 2x10; forward reaches with Red ball 2x10     Shoulder Exercises: Standing   Other Standing Exercises # level cabinet reaches 1lb x10 flex & abd 2 level  with abd     Shoulder Exercises: ROM/Strengthening   UBE (Upper Arm Bike) L1 51mn frd/ 3 rev     Moist Heat Therapy   Number Minutes Moist Heat 10 Minutes   Moist Heat Location Shoulder                  PT Short Term Goals - 11/13/16 1500      PT SHORT TERM GOAL #1   Title independent with initial HEP   Time 2   Period Weeks   Status New           PT Long Term Goals - 12/23/16 1349      PT LONG TERM GOAL #1   Title understand proper posture and body mechanics   Status Partially Met     PT LONG TERM GOAL #2   Title increase right shoulder AROM to 130 degrees flexion   Status On-going     PT LONG TERM GOAL #3   Title decrease pain 50%   Status On-going     PT LONG TERM GOAL #4   Title increase right shoulder strength to 4-/5 for ER   Status On-going               Plan - 12/23/16 1346    Clinical Impression Statement Pt with limited R shoulder AROM, progressed to 3 level cabinet reaches with 1lb weight, pt with more difficulty as he reached higher. Limited ROM with R shoulder ER resistance light tband resistance.   Rehab Potential Good   PT Frequency 2x / week   PT Duration 8 weeks   PT Treatment/Interventions ADLs/Self Care Home Management;Cryotherapy;Electrical Stimulation;Moist Heat;Ultrasound;Therapeutic activities;Therapeutic exercise;Balance training;Neuromuscular re-education;Manual techniques   PT Next Visit Plan increase strength below 90 degrees in shoulder flex./abd., along with scapula stabilization exercises.      Patient will benefit from skilled therapeutic intervention in order to improve the following deficits and impairments:  Abnormal gait, Decreased activity tolerance, Decreased balance, Decreased mobility, Decreased range of motion, Decreased strength, Difficulty walking, Postural dysfunction, Pain  Visit Diagnosis: Acute pain of right shoulder  Stiffness of right shoulder, not elsewhere classified     Problem  List Patient Active Problem List   Diagnosis Date Noted  . Varicose veins of right lower extremity with complications 116/05/3709 . Obstructive lung disease (generalized) ([place] 09/26/2016  . Carpal tunnel syndrome 06/25/2016  . Hereditary and idiopathic peripheral neuropathy 06/25/2016  . Paresthesia 05/27/2016  . Abnormality of gait 05/27/2016  . Chest x-ray abnormality 01/09/2016  . Paroxysmal atrial flutter ([place] 12/05/2015  . Constipation 06/13/2015  . Encounter for therapeutic drug monitoring 05/25/2014  . Syncope 04/03/2014  . Rhabdomyolysis 04/03/2014  . UTI (urinary tract infection) 04/03/2014  . Acute renal failure ([place]  04/03/2014  . Leukocytosis 04/03/2014  . Internal nasal lesion 05/15/2013  . Low back pain 04/19/2013  . Melanoma ([place]) 09/30/2012  . Cough 03/26/2012  . Hx of mitral valve repair 11/19/2011  . Long term (current) use of anticoagulants 11/03/2011  . Decubitus skin ulcer 10/28/2011  . Pleural effusion due to congestive heart failure ([place]) 10/28/2011  . Atrial fibrillation ([place]) 10/07/2011  . S/P mitral valve repair 10/01/2011  . Valvular heart disease 08/21/2011  . Cerumen impaction 04/09/2011  . Hearing loss 04/09/2011  . THROMBOCYTOPENIA 09/19/2010  . ADENOCARCINOMA, PROSTATE 09/17/2010  . CALLUS, LEFT FOOT 09/17/2010  . BACK PAIN, CHRONIC 09/17/2010  . TINEA PEDIS 05/28/2009  . DERMATITIS, ATOPIC 04/10/2009  . HYPERCHOLESTEROLEMIA 06/09/2008  . DEPRESSION 06/09/2008  . HYPERTENSION, BENIGN ESSENTIAL 06/09/2008  . GERD 06/09/2008  . OSTEOARTHRITIS, GENERALIZED, MULTIPLE JOINTS 06/09/2008  . MUSCLE SPASM, BACK 06/09/2008  . PERSONAL HISTORY MALIGNANT NEOPLASM PROSTATE 06/09/2008  . PERSONAL HISTORY OF MALIGNANT MELANOMA OF SKIN 06/09/2008  . ARRHYTHMIA, HX OF 06/09/2008  . Personal history of colonic polyps 06/09/2008    Scot Jun 12/23/2016, 1:50 PM  [place], [place], [address] Phone: [phone]   Fax:  [phone]  Name: [name] [name] MRN: [id number] Date of Birth: 04-03-1931

## 2016-12-26 ENCOUNTER — Ambulatory Visit: Payer: Medicare Other | Admitting: Physician Assistant

## 2016-12-30 ENCOUNTER — Encounter: Payer: Self-pay | Admitting: Physical Therapy

## 2016-12-30 ENCOUNTER — Ambulatory Visit: Payer: Medicare Other | Admitting: Physical Therapy

## 2016-12-30 DIAGNOSIS — M25511 Pain in right shoulder: Secondary | ICD-10-CM

## 2016-12-30 DIAGNOSIS — M25611 Stiffness of right shoulder, not elsewhere classified: Secondary | ICD-10-CM

## 2016-12-30 NOTE — Therapy (Signed)
[place], [place], [address] Phone: [phone]   Fax:  [phone]  Physical Therapy Treatment  Patient Details  Name: [name] [name] MRN: [id number] Date of Birth: Mar 05, 1931 Referring Provider: Durward Fortes  Encounter Date: 12/30/2016      PT End of Session - 12/30/16 1426    Visit Number 6   Date for PT Re-Evaluation 01/14/17   PT Start Time 1352   PT Stop Time 1435   PT Time Calculation (min) 43 min   Activity Tolerance Patient tolerated treatment well   Behavior During Therapy [facility] for tasks assessed/performed      Past Medical History:  Diagnosis Date  . Abnormality of gait 05/27/2016  . Adenomatous polyps   . Carpal tunnel syndrome 06/25/2016   Right  . Depressive disorder, not elsewhere classified   . Hereditary and idiopathic peripheral neuropathy 06/25/2016  . Hypertension   . Internal nasal lesion 05/15/2013  . Melanoma ([place])    Left Shoulder  . Mitral regurgitation   . MVP (mitral valve prolapse)    a. With severe MR s/p Complex valvuloplasty including artificial Gore-tex neochord placement x4, chordal transposition x1, chordal release x1, # 32 mm Sorin Memo 3D Ring Annuloplasty 2012. // b. Echo 2/18: mild LVH, EF 50-55, mild AI, MV repair with mild MR, mod LAE, mod RVE, severe RAE, severe TR  . Neuropathy ([place])   . Normal coronary arteries    a. Normal coronary anatomy by cath 2012.  . Osteoarthritis    Knees  . PAF (paroxysmal atrial fibrillation) ([place])    a. Post-op MVR 2012.  Marland Kitchen Personal history of colonic polyps   . Prostate cancer ([place])   . Pulmonary HTN    a. Mild-mod by cath 2012.  . Pure hypercholesterolemia   . PVC (premature ventricular contraction)   . Thrombocytopenia ([place])   . Vision abnormalities    Cornea scarring    Past Surgical History:  Procedure Laterality Date  . CARDIAC CATHETERIZATION  09/2011   Pre-op for MVR -- normal coronaries.  Marland Kitchen  CARDIOVERSION N/A 01/02/2016   Procedure: CARDIOVERSION;  Surgeon: Thayer Headings, MD;  Location: [facility] ENDOSCOPY;  Service: Cardiovascular;  Laterality: N/A;  . COLONOSCOPY W/ POLYPECTOMY    . INGUINAL HERNIA REPAIR  09/2009   Left  . KNEE ARTHROSCOPY      left x3  and right x2  . Melanoma Surgery     2001, 2005, 2006, 2009  . MITRAL VALVE REPAIR  10/01/2011   complex valvuloplasty with Goretex cord replacement and chordal transposition 20m Sorin Memo 3D ring annuloplasty  . Nuclear Stress Test  09/2006   EF-64%, Normal  . PROSTATECTOMY  1993  . ROOT CANAL  08-19-12  . ROTATOR CUFF REPAIR  2003   left  . TEE WITHOUT CARDIOVERSION  09/26/2011   Procedure: TRANSESOPHAGEAL ECHOCARDIOGRAM (TEE);  Surgeon: BLelon Perla MD;  [facility];  Service: Cardiovascular;  Laterality: N/A;  . UKoreaECHOCARDIOGRAPHY  09/2009, 08/1011   mild LVH,mild AI,MVP with mild MR, mild-mod. TR with mild Pulm. HTN, EF-55-60%    There were no vitals filed for this visit.      Subjective Assessment - 12/30/16 1357    Subjective "About a 5, a little better form time to time"   Currently in Pain? Yes   Pain Score 5    Pain Location Shoulder  [facility] Adult PT Treatment/Exercise - 12/30/16 0001      Shoulder Exercises: Seated   Extension Strengthening;AROM;Both;Theraband;10 reps  x2   Theraband Level (Shoulder Extension) Level 4 (Blue)   Row Strengthening;AROM;Both;15 reps;Theraband  x2   Theraband Level (Shoulder Row) Level 4 (Blue)   External Rotation AROM;Strengthening;Right;15 reps;Theraband   Theraband Level (Shoulder External Rotation) Level 1 (Yellow)   Internal Rotation 15 reps;Theraband  x2   Theraband Level (Shoulder Internal Rotation) Level 1 (Yellow)   Flexion Both;10 reps;AROM   Other Seated Exercises Forward reaches with red ball; bilat shoulder blexion with red ball 2x10 each    Other Seated Exercises R shoulder eccentrics with flex 3x5       Shoulder Exercises: Standing   Other Standing Exercises 1 level cabinet reaches 1lb x10 flex      Shoulder Exercises: ROM/Strengthening   UBE (Upper Arm Bike) L3 100mn frd/ 3 rev     Moist Heat Therapy   Number Minutes Moist Heat 10 Minutes   Moist Heat Location Shoulder                  PT Short Term Goals - 11/13/16 1500      PT SHORT TERM GOAL #1   Title independent with initial HEP   Time 2   Period Weeks   Status New           PT Long Term Goals - 12/30/16 1428      PT LONG TERM GOAL #1   Title understand proper posture and body mechanics   Status Achieved     PT LONG TERM GOAL #2   Title increase right shoulder AROM to 130 degrees flexion   Status On-going     PT LONG TERM GOAL #3   Title decrease pain 50%   Status Partially Met     PT LONG TERM GOAL #4   Title increase right shoulder strength to 4-/5 for ER   Status On-going               Plan - 12/30/16 1426    Clinical Impression Statement Pt ~ 7 minutes late for today's session. L shoulder ROM remains limited. Pt with difficulty passing neutral with resisted ER with RUE. Pt fatigues quickly with LUE eccentrics with L shoulder flexion.     Rehab Potential Good   PT Frequency 2x / week   PT Duration 8 weeks   PT Treatment/Interventions ADLs/Self Care Home Management;Cryotherapy;Electrical Stimulation;Moist Heat;Ultrasound;Therapeutic activities;Therapeutic exercise;Balance training;Neuromuscular re-education;Manual techniques   PT Next Visit Plan increase strength below 90 degrees in shoulder flex./abd., along with scapula stabilization exercises.      Patient will benefit from skilled therapeutic intervention in order to improve the following deficits and impairments:  Abnormal gait, Decreased activity tolerance, Decreased balance, Decreased mobility, Decreased range of motion, Decreased strength, Difficulty walking, Postural dysfunction, Pain  Visit Diagnosis: Acute pain of  right shoulder  Stiffness of right shoulder, not elsewhere classified     Problem List Patient Active Problem List   Diagnosis Date Noted  . Varicose veins of right lower extremity with complications 182/64/1583 . Obstructive lung disease (generalized) ([place] 09/26/2016  . Carpal tunnel syndrome 06/25/2016  . Hereditary and idiopathic peripheral neuropathy 06/25/2016  . Paresthesia 05/27/2016  . Abnormality of gait 05/27/2016  . Chest x-ray abnormality 01/09/2016  . Paroxysmal atrial flutter ([place] 12/05/2015  . Constipation 06/13/2015  . Encounter for therapeutic drug monitoring 05/25/2014  . Syncope 04/03/2014  . Rhabdomyolysis 04/03/2014  .  UTI (urinary tract infection) 04/03/2014  . Acute renal failure ([place]) 04/03/2014  . Leukocytosis 04/03/2014  . Internal nasal lesion 05/15/2013  . Low back pain 04/19/2013  . Melanoma ([place]) 09/30/2012  . Cough 03/26/2012  . Hx of mitral valve repair 11/19/2011  . Long term (current) use of anticoagulants 11/03/2011  . Decubitus skin ulcer 10/28/2011  . Pleural effusion due to congestive heart failure ([place]) 10/28/2011  . Atrial fibrillation ([place]) 10/07/2011  . S/P mitral valve repair 10/01/2011  . Valvular heart disease 08/21/2011  . Cerumen impaction 04/09/2011  . Hearing loss 04/09/2011  . THROMBOCYTOPENIA 09/19/2010  . ADENOCARCINOMA, PROSTATE 09/17/2010  . CALLUS, LEFT FOOT 09/17/2010  . BACK PAIN, CHRONIC 09/17/2010  . TINEA PEDIS 05/28/2009  . DERMATITIS, ATOPIC 04/10/2009  . HYPERCHOLESTEROLEMIA 06/09/2008  . DEPRESSION 06/09/2008  . HYPERTENSION, BENIGN ESSENTIAL 06/09/2008  . GERD 06/09/2008  . OSTEOARTHRITIS, GENERALIZED, MULTIPLE JOINTS 06/09/2008  . MUSCLE SPASM, BACK 06/09/2008  . PERSONAL HISTORY MALIGNANT NEOPLASM PROSTATE 06/09/2008  . PERSONAL HISTORY OF MALIGNANT MELANOMA OF SKIN 06/09/2008  . ARRHYTHMIA, HX OF 06/09/2008  . Personal history of colonic polyps 06/09/2008    Scot Jun 12/30/2016,  2:29 PM  [place] [address] [place], [place], [address] Phone: [phone]   Fax:  [phone]  Name: [name] [name] MRN: [id number] Date of Birth: June 29, 1931

## 2017-01-02 ENCOUNTER — Ambulatory Visit: Payer: Medicare Other | Admitting: Physical Therapy

## 2017-01-02 ENCOUNTER — Ambulatory Visit: Payer: Medicare Other | Admitting: Physician Assistant

## 2017-01-09 ENCOUNTER — Ambulatory Visit (INDEPENDENT_AMBULATORY_CARE_PROVIDER_SITE_OTHER): Payer: Medicare Other | Admitting: *Deleted

## 2017-01-09 ENCOUNTER — Encounter: Payer: Self-pay | Admitting: Physician Assistant

## 2017-01-09 ENCOUNTER — Ambulatory Visit (INDEPENDENT_AMBULATORY_CARE_PROVIDER_SITE_OTHER): Payer: Medicare Other | Admitting: Physician Assistant

## 2017-01-09 ENCOUNTER — Telehealth: Payer: Self-pay | Admitting: *Deleted

## 2017-01-09 VITALS — BP 118/64 | HR 62 | Ht 77.0 in | Wt 191.0 lb

## 2017-01-09 DIAGNOSIS — Z5181 Encounter for therapeutic drug level monitoring: Secondary | ICD-10-CM

## 2017-01-09 DIAGNOSIS — I10 Essential (primary) hypertension: Secondary | ICD-10-CM | POA: Diagnosis not present

## 2017-01-09 DIAGNOSIS — I480 Paroxysmal atrial fibrillation: Secondary | ICD-10-CM

## 2017-01-09 DIAGNOSIS — Z9889 Other specified postprocedural states: Secondary | ICD-10-CM

## 2017-01-09 DIAGNOSIS — I440 Atrioventricular block, first degree: Secondary | ICD-10-CM

## 2017-01-09 DIAGNOSIS — I4891 Unspecified atrial fibrillation: Secondary | ICD-10-CM

## 2017-01-09 DIAGNOSIS — I5042 Chronic combined systolic (congestive) and diastolic (congestive) heart failure: Secondary | ICD-10-CM | POA: Diagnosis not present

## 2017-01-09 DIAGNOSIS — Z7901 Long term (current) use of anticoagulants: Secondary | ICD-10-CM

## 2017-01-09 LAB — BASIC METABOLIC PANEL
BUN/Creatinine Ratio: 23 (ref 10–24)
BUN: 25 mg/dL (ref 8–27)
CALCIUM: 9.5 mg/dL (ref 8.6–10.2)
CHLORIDE: 103 mmol/L (ref 96–106)
CO2: 23 mmol/L (ref 18–29)
Creatinine, Ser: 1.07 mg/dL (ref 0.76–1.27)
GFR calc Af Amer: 74 mL/min/{1.73_m2} (ref >59)
GFR calc non Af Amer: 64 mL/min/{1.73_m2} (ref >59)
GLUCOSE: 90 mg/dL (ref 65–99)
Potassium: 5.1 mmol/L (ref 3.5–5.2)
Sodium: 143 mmol/L (ref 134–144)

## 2017-01-09 LAB — POCT INR: INR: 2.8

## 2017-01-09 NOTE — Patient Instructions (Addendum)
Medication Instructions:  Your physician recommends that you continue on your current medications as directed. Please refer to the Current Medication list given to you today.  Labwork: 1. TODAY BMET ONLY 2. IN 4 MONTHS YOU WILL NEED CMET, TSH; THIS LAB WORK CAN BE DONE WHEN YOU SEE SCOTT WEAVER, PAC   Testing/Procedures: 1. Your physician has recommended that you wear a 24 HOUR holter monitor. Holter monitors are medical devices that record the heart's electrical activity. Doctors most often use these monitors to diagnose arrhythmias. Arrhythmias are problems with the speed or rhythm of the heartbeat. The monitor is a small, portable device. You can wear one while you do your normal daily activities. This is usually used to diagnose what is causing palpitations/syncope (passing out).  Follow-Up: SCOTT WEAVER, [facility] 4 MONTHS   Any Other Special Instructions Will Be Listed Below (If Applicable).  If you need a refill on your cardiac medications before your next appointment, please call your pharmacy.

## 2017-01-09 NOTE — Telephone Encounter (Signed)
DPR ok to leave detailed message on cell. Lab work normal, continue current Tx plan as outlined today. Any questions call [phone].

## 2017-01-09 NOTE — Progress Notes (Signed)
Cardiology Office Note:    Date:  01/09/2017   ID:  [name], DOB Jan 08, 1931, MRN [id number]  PCP:  [name] Homans, MD  Cardiologist:  Dr. Joretta Bachelor, PA-C  Electrophysiologist:  N/a Neurologist: Dr. Jannifer Franklin VVS: Dr. Trula Slade Pulmonology: Dr. Lamonte Sakai  Referring MD: [name] Lukes, MD   Chief Complaint  Patient presents with  . Follow-up    Afib    History of Present Illness:    [name] [name] is a 83 y.o. male previously followed by Dr. Mare Ferrari with a hx of PAF/flutter, mitral valve prolapse with severe mitral regurgitation status post mitral valve repair in 2012 with Dr. Roxy [name], chronic anticoagulation with Coumadin, prostate CA, HTN, HL, chronic thrombocytopenia, venous insufficiency. He had recurrent atrial fibrillation in 1/17 with variable ventricular response. Echocardiogram at that time demonstrated reduced LV function with an EF of 45-50% and moderate diastolic dysfunction. Mitral valve repair was stable with just mild MS. He was placed on amiodarone and underwent DCCV.  He has had persistent left lower lobe rounded atelectasis based upon chest x-ray and chest CT findings.  Last seen 10/17. PFTs were obtained as he is on amiodarone. These demonstrated severely decreased DLCO and he was referred to pulmonology.  He was evaluated by Dr. Lamonte Sakai. It was felt that it was safe for him to remain on amiodarone as he did not demonstrate any signs of amiodarone toxicity.  He returns for follow up.  He is here alone.  He recently had a melanoma removed from his back.  He has not been wearing his compression stockings b/c they are hard to get on with the wound on his back.  His leg edema has worsened.  He denies significant shortness of breath. He denies chest pain, syncope, dizziness, orthopnea, PND.  He denies any bleeding issues.    Prior CV studies:   The following studies were reviewed today:  Echo 12/19/16 Mild LVH, EF 50-55, mild AI, mitral valve repair functioning  normally with mild residual MR, moderate LAE, moderate RVE, severe RAE, severe TR  ABIs 07/07/16 Normal  Echo 12/27/15 Mild concentric LVH, EF 45-50%, normal wall motion, grade 2 diastolic dysfunction, trivial AI, s/p MV repair with normal function, mild mitral stenosis, mean gradient 4 mmHg, mild MR, mild LAE, moderate RVE, severe RAE, moderate TR  Event Monitor 5/15 occ AFib, predominant NSR, occ PVCs, no VT  Echo (03/2014):  Moderate LVH. EF 50%. MV repair okay, mild MS. Right ventricle: The cavity size was mildly dilated. Systolic function was mildly reduced. - Right atrium: The atrium was mildly dilated. - No obvious change from the prior study.  Carotid US 11/12 No sig ICA stenosis  LHC 10/12 LM normal LAD normal LCx normal RCA normal EF 60%, severe MR  Past Medical History:  Diagnosis Date  . Abnormality of gait 05/27/2016  . Adenomatous polyps   . Carpal tunnel syndrome 06/25/2016   Right  . Depressive disorder, not elsewhere classified   . Hereditary and idiopathic peripheral neuropathy 06/25/2016  . Hypertension   . Internal nasal lesion 05/15/2013  . Melanoma ([place])    Left Shoulder  . Mitral regurgitation   . MVP (mitral valve prolapse)    a. With severe MR s/p Complex valvuloplasty including artificial Gore-tex neochord placement x4, chordal transposition x1, chordal release x1, # 32 mm Sorin Memo 3D Ring Annuloplasty 2012. // b. Echo 2/18: mild LVH, EF 50-55, mild AI, MV repair with mild MR, mod LAE, mod RVE, severe RAE, severe  TR  . Neuropathy ([name] [name])   . Normal coronary arteries    a. Normal coronary anatomy by cath 2012.  . Osteoarthritis    Knees  . PAF (paroxysmal atrial fibrillation) ([name] [name])    a. Post-op MVR 2012.  Marland [name] Personal history of colonic polyps   . Prostate cancer ([name] [name])   . Pulmonary HTN    a. Mild-mod by cath 2012.  . Pure hypercholesterolemia   . PVC (premature ventricular contraction)   . Thrombocytopenia ([place])   . Vision abnormalities     Cornea scarring    Past Surgical History:  Procedure Laterality Date  . CARDIAC CATHETERIZATION  09/2011   Pre-op for MVR -- normal coronaries.  Marland [name] CARDIOVERSION N/A 01/02/2016   Procedure: CARDIOVERSION;  Surgeon: Thayer Headings, MD;  Location: [facility] ENDOSCOPY;  Service: Cardiovascular;  Laterality: N/A;  . COLONOSCOPY W/ POLYPECTOMY    . INGUINAL HERNIA REPAIR  09/2009   Left  . KNEE ARTHROSCOPY      left x3  and right x2  . Melanoma Surgery     2001, 2005, 2006, 2009  . MITRAL VALVE REPAIR  10/01/2011   complex valvuloplasty with Goretex cord replacement and chordal transposition 40mm Sorin Memo 3D ring annuloplasty  . Nuclear Stress Test  09/2006   EF-64%, Normal  . PROSTATECTOMY  1993  . ROOT CANAL  08-19-12  . ROTATOR CUFF REPAIR  2003   left  . TEE WITHOUT CARDIOVERSION  09/26/2011   Procedure: TRANSESOPHAGEAL ECHOCARDIOGRAM (TEE);  Surgeon: Lelon Perla, MD;  Location: [facility] ENDOSCOPY;  Service: Cardiovascular;  Laterality: N/A;  . US ECHOCARDIOGRAPHY  09/2009, 08/1011   mild LVH,mild AI,MVP with mild MR, mild-mod. TR with mild Pulm. HTN, EF-55-60%    Current Medications: Current Meds  Medication Sig  . amiodarone (PACERONE) 200 MG tablet Take 200 mg by mouth daily.   Marland [name] CARTIA XT 300 MG 24 hr capsule Take 300 mg by mouth daily.   . furosemide (LASIX) 40 MG tablet Take 40 mg by mouth every other day.   . gabapentin (NEURONTIN) 300 MG capsule Take 300 mg by mouth 2 (two) times daily.   . potassium chloride SA (K-DUR,KLOR-CON) 20 MEQ tablet Take 1 tablet (20 mEq total) by mouth daily.  . rosuvastatin (CRESTOR) 5 MG tablet Take 5 mg by mouth daily at 6 PM.   . TOPROL XL 50 MG 24 hr tablet Take 50 mg by mouth daily.   . traZODone (DESYREL) 100 MG tablet Take 300 mg by mouth at bedtime.   . vitamin C (ASCORBIC ACID) 500 MG tablet Take 500 mg by mouth 2 (two) times daily.  Marland [name] warfarin (COUMADIN) 5 MG tablet TAKE 1 TABLET DAILY AS DIRECTED BY THE COUMADIN CLINIC     Allergies:    Patient has no known allergies.   Social History   Social History  . Marital status: Widowed    Spouse name: N/A  . Number of children: 2  . Years of education: 53   Social History Main Topics  . Smoking status: Never Smoker  . Smokeless tobacco: Never Used  . Alcohol use No     Comment: Last drink in 2000  . Drug use: No  . Sexual activity: Not Asked   Other Topics Concern  . None   Social History Narrative   Retired Brewing technologist   Widower   2 children   Drinks 1 cup of coffee per day     Family History  Problem Relation Age  of Onset  . Clotting disorder Brother     CVA's  . Arthritis Mother   . Hypertension Mother   . Stroke Mother   . Hypertension Father   . Psychosis Father     psychiatric care  . Colon cancer Neg Hx   . Stomach cancer Neg Hx   . Heart attack Neg Hx      ROS:   Please see the history of present illness.    Review of Systems  Cardiovascular: Positive for leg swelling.   All other systems reviewed and are negative.   EKGs/Labs/Other Test Reviewed:    EKG:  EKG is  ordered today.  The ekg ordered today demonstrates NSR ,HR 62, 1st degree AVB (PR 330), QTc 432 ms  Recent Labs: 08/25/2016: ALT 22; BUN 23; Creat 1.06; Potassium 4.8; Sodium 141; TSH 0.73   Recent Lipid Panel    Component Value Date/Time   CHOL 112 (L) 11/02/2015 1048   TRIG 66 11/02/2015 1048   HDL 49 11/02/2015 1048   CHOLHDL 2.3 11/02/2015 1048   VLDL 13 11/02/2015 1048   LDLCALC 50 11/02/2015 1048     Physical Exam:    VS:  BP 118/64   Pulse 62   Ht 6\' 5"  (1.956 m)   Wt 191 lb (86.6 kg)   BMI 22.65 kg/m     Wt Readings from Last 3 Encounters:  01/09/17 191 lb (86.6 kg)  12/02/16 182 lb (82.6 kg)  11/25/16 182 lb (82.6 kg)     Physical Exam  Constitutional: He is oriented to person, place, and time. He appears well-developed and well-nourished. No distress.  HENT:  Head: Normocephalic and atraumatic.  Eyes: No scleral icterus.  Neck: No JVD  present.  Cardiovascular: Normal rate, regular rhythm and normal heart sounds.   No murmur heard. Pulmonary/Chest: Effort normal and breath sounds normal. He has no wheezes. He has no rales.  Abdominal: Soft. There is no tenderness.  Musculoskeletal: He exhibits edema.  1-2+ bilat LE edema  Neurological: He is alert and oriented to person, place, and time.  Skin: Skin is warm and dry.  Psychiatric: He has a normal mood and affect.    ASSESSMENT:    1. S/P mitral valve repair   2. PAF (paroxysmal atrial fibrillation) ([place])   3. Chronic combined systolic and diastolic CHF (congestive heart failure) ([place])   4. Essential hypertension, benign   5. 1st degree AV block    PLAN:    In order of problems listed above:  1. S/p MV Repair - Recent echo this month with normally functioning MV repair.  EF is now back to normal. Continue SBE prophylaxis.   2. PAF - He is maintaining NSR on amiodarone. Recent evaluation by Pulmonology for abnormal PFTs.  He has no signs of pulmonary toxicity and can remain on Amiodarone.  TSH, LFTs in 10/17 were ok.  Will get CMET, TSH at next OV.  3. Chronic combined systolic and diastolic CHF - EF was [id number] and is now improved to 50-55%. His LE edema is mainly related to his venous insufficiency.  His volume overall looks stable.  He can use more Lasix if needed for his swelling.  His K+ prescription was recently changed.  Will get BMET today.   4. HTN -  BP controlle.d    5. 1st Degree AVB - He is not symptomatic.  I suggested taking him off of Toprol.  But, he is hesitant to do this.  Therefore, I  will get a 24 hour Holter to r/o high grade HB.  If evident, will need to stop at least on AVN agent.    Medication Adjustments/Labs and Tests Ordered: Current medicines are reviewed at length with the patient today.  Concerns regarding medicines are outlined above.  Medication changes, Labs and Tests ordered today are outlined in the Patient Instructions noted  below. Patient Instructions  Medication Instructions:  Your physician recommends that you continue on your current medications as directed. Please refer to the Current Medication list given to you today.  Labwork: 1. TODAY BMET ONLY 2. IN 4 MONTHS YOU WILL NEED CMET, TSH; THIS LAB WORK CAN BE DONE WHEN YOU SEE Lindalee Huizinga, PAC   Testing/Procedures: 1. Your physician has recommended that you wear a 24 HOUR holter monitor. Holter monitors are medical devices that record the heart's electrical activity. Doctors most often use these monitors to diagnose arrhythmias. Arrhythmias are problems with the speed or rhythm of the heartbeat. The monitor is a small, portable device. You can wear one while you do your normal daily activities. This is usually used to diagnose what is causing palpitations/syncope (passing out).  Follow-Up: Bill Mcvey, [facility] 4 MONTHS   Any Other Special Instructions Will Be Listed Below (If Applicable).  If you need a refill on your cardiac medications before your next appointment, please call your pharmacy.   Return in about 4 months (around 05/09/2017) for Routine Follow Up, w/ Richardson Dopp, PA-C.   Signed, Richardson Dopp, PA-C  01/09/2017 11:45 AM    [facility] [place], [place], [place]  [address] Phone: [phone]; Fax: [phone]

## 2017-01-15 ENCOUNTER — Encounter: Payer: Self-pay | Admitting: Family Medicine

## 2017-01-15 ENCOUNTER — Ambulatory Visit (INDEPENDENT_AMBULATORY_CARE_PROVIDER_SITE_OTHER): Payer: Medicare Other | Admitting: Family Medicine

## 2017-01-15 VITALS — BP 121/58 | HR 60 | Temp 98.1°F | Ht 77.0 in | Wt 192.0 lb

## 2017-01-15 DIAGNOSIS — K5900 Constipation, unspecified: Secondary | ICD-10-CM | POA: Diagnosis not present

## 2017-01-15 NOTE — Progress Notes (Signed)
[place] at [facility] [address], [place], [place] [address] [phone]  Date:  01/15/2017   Name:  [name] [name]   DOB:  10/10/1931   MRN:  [id number]  PCP:  Penni Homans, MD    Chief Complaint: Constipation (c/o constipation. pt states that he has not had a good bm in about 6 day. )   History of Present Illness:  [name] [name] is a 83 y.o. very pleasant male patient who presents with the following:  Pt of Dr. Charlett Blake, last seen here in April and it does not appear he has actually seen Dr. Charlett Blake in several years.  Here today with complaint of stool change/ constipatoin for the last 4-5 days. He is passing stool but just not as much stool as he normally does No rectal pain or bleeding  Took Ducolax for 3 days.  Had small BMs those 3 days, about 2 inches in length & 1 inch in diameter.    Has had similar problems in the past.  Fluid intake has not changed.  Has about 3-4 glasses of fluid per day.  Has 1 boost per day.  Has been eating more carbs here lately.  Does take metamucil every day.  Tried miralax for 3-4 days & made his BMs mushy, did not cause diarrhea.    Diet is about the same.  Good appetite.  Mostly cook at home.  Lives alone.  Tried a suppository twice, 1 time it worked.    Denies cramping or abdominal pain. He did start myrbetriq for urinary sx recently- this is the only real change in his routine that we can think of Otherwise he is feeling well- no fever, chills, nausea, vomiting BP Readings from Last 3 Encounters:  01/15/17 (!) 121/58  01/09/17 118/64  12/02/16 140/76   Wt Readings from Last 3 Encounters:  01/15/17 192 lb (87.1 kg)  01/09/17 191 lb (86.6 kg)  12/02/16 182 lb (82.6 kg)     Patient Active Problem List   Diagnosis Date Noted  . Varicose veins of right lower extremity with complications 0000000  . Obstructive lung disease (generalized) ([place]) 09/26/2016  . Carpal tunnel syndrome  06/25/2016  . Hereditary and idiopathic peripheral neuropathy 06/25/2016  . Paresthesia 05/27/2016  . Abnormality of gait 05/27/2016  . Chest x-ray abnormality 01/09/2016  . Paroxysmal atrial flutter ([place]) 12/05/2015  . Constipation 06/13/2015  . Encounter for therapeutic drug monitoring 05/25/2014  . Syncope 04/03/2014  . Rhabdomyolysis 04/03/2014  . UTI (urinary tract infection) 04/03/2014  . Acute renal failure ([place]) 04/03/2014  . Leukocytosis 04/03/2014  . Internal nasal lesion 05/15/2013  . Low back pain 04/19/2013  . Melanoma ([place]) 09/30/2012  . Cough 03/26/2012  . Hx of mitral valve repair 11/19/2011  . Long term (current) use of anticoagulants 11/03/2011  . Decubitus skin ulcer 10/28/2011  . Pleural effusion due to congestive heart failure ([place]) 10/28/2011  . Atrial fibrillation ([place]) 10/07/2011  . S/P mitral valve repair 10/01/2011  . Valvular heart disease 08/21/2011  . Cerumen impaction 04/09/2011  . Hearing loss 04/09/2011  . THROMBOCYTOPENIA 09/19/2010  . ADENOCARCINOMA, PROSTATE 09/17/2010  . CALLUS, LEFT FOOT 09/17/2010  . BACK PAIN, CHRONIC 09/17/2010  . TINEA PEDIS 05/28/2009  . DERMATITIS, ATOPIC 04/10/2009  . HYPERCHOLESTEROLEMIA 06/09/2008  . DEPRESSION 06/09/2008  . HYPERTENSION, BENIGN ESSENTIAL 06/09/2008  . GERD 06/09/2008  . OSTEOARTHRITIS, GENERALIZED, MULTIPLE JOINTS 06/09/2008  . MUSCLE SPASM, BACK 06/09/2008  .  PERSONAL HISTORY MALIGNANT NEOPLASM PROSTATE 06/09/2008  . PERSONAL HISTORY OF MALIGNANT MELANOMA OF SKIN 06/09/2008  . ARRHYTHMIA, HX OF 06/09/2008  . Personal history of colonic polyps 06/09/2008    Past Medical History:  Diagnosis Date  . Abnormality of gait 05/27/2016  . Adenomatous polyps   . Carpal tunnel syndrome 06/25/2016   Right  . Depressive disorder, not elsewhere classified   . Hereditary and idiopathic peripheral neuropathy 06/25/2016  . Hypertension   . Internal nasal lesion 05/15/2013  . Melanoma ([place])    Left  Shoulder  . Mitral regurgitation   . MVP (mitral valve prolapse)    a. With severe MR s/p Complex valvuloplasty including artificial Gore-tex neochord placement x4, chordal transposition x1, chordal release x1, # 32 mm Sorin Memo 3D Ring Annuloplasty 2012. // b. Echo 2/18: mild LVH, EF 50-55, mild AI, MV repair with mild MR, mod LAE, mod RVE, severe RAE, severe TR  . Neuropathy ([place])   . Normal coronary arteries    a. Normal coronary anatomy by cath 2012.  . Osteoarthritis    Knees  . PAF (paroxysmal atrial fibrillation) ([place])    a. Post-op MVR 2012.  Marland Kitchen Personal history of colonic polyps   . Prostate cancer ([place])   . Pulmonary HTN    a. Mild-mod by cath 2012.  . Pure hypercholesterolemia   . PVC (premature ventricular contraction)   . Thrombocytopenia ([place])   . Vision abnormalities    Cornea scarring    Past Surgical History:  Procedure Laterality Date  . CARDIAC CATHETERIZATION  09/2011   Pre-op for MVR -- normal coronaries.  Marland Kitchen CARDIOVERSION N/A 01/02/2016   Procedure: CARDIOVERSION;  Surgeon: Thayer Headings, MD;  Location: [facility] ENDOSCOPY;  Service: Cardiovascular;  Laterality: N/A;  . COLONOSCOPY W/ POLYPECTOMY    . INGUINAL HERNIA REPAIR  09/2009   Left  . KNEE ARTHROSCOPY      left x3  and right x2  . Melanoma Surgery     2001, 2005, 2006, 2009  . MITRAL VALVE REPAIR  10/01/2011   complex valvuloplasty with Goretex cord replacement and chordal transposition 14mm Sorin Memo 3D ring annuloplasty  . Nuclear Stress Test  09/2006   EF-64%, Normal  . PROSTATECTOMY  1993  . ROOT CANAL  08-19-12  . ROTATOR CUFF REPAIR  2003   left  . TEE WITHOUT CARDIOVERSION  09/26/2011   Procedure: TRANSESOPHAGEAL ECHOCARDIOGRAM (TEE);  Surgeon: Lelon Perla, MD;  Location: [facility] ENDOSCOPY;  Service: Cardiovascular;  Laterality: N/A;  . US ECHOCARDIOGRAPHY  09/2009, 08/1011   mild LVH,mild AI,MVP with mild MR, mild-mod. TR with mild Pulm. HTN, EF-55-60%    Social History  Substance Use  Topics  . Smoking status: Never Smoker  . Smokeless tobacco: Never Used  . Alcohol use No     Comment: Last drink in 2000    Family History  Problem Relation Age of Onset  . Clotting disorder Brother     CVA's  . Arthritis Mother   . Hypertension Mother   . Stroke Mother   . Hypertension Father   . Psychosis Father     psychiatric care  . Colon cancer Neg Hx   . Stomach cancer Neg Hx   . Heart attack Neg Hx     No Known Allergies  Medication list has been reviewed and updated.  Current Outpatient Prescriptions on File Prior to Visit  Medication Sig Dispense Refill  . amiodarone (PACERONE) 200 MG tablet Take 200 mg by  mouth daily.     Marland Kitchen CARTIA XT 300 MG 24 hr capsule Take 300 mg by mouth daily.     . furosemide (LASIX) 40 MG tablet Take 40 mg by mouth every other day.     . gabapentin (NEURONTIN) 300 MG capsule Take 300 mg by mouth 2 (two) times daily.     . potassium chloride SA (K-DUR,KLOR-CON) 20 MEQ tablet Take 1 tablet (20 mEq total) by mouth daily. 90 tablet 2  . rosuvastatin (CRESTOR) 5 MG tablet Take 5 mg by mouth daily at 6 PM.     . TOPROL XL 50 MG 24 hr tablet Take 50 mg by mouth daily.     . traZODone (DESYREL) 100 MG tablet Take 300 mg by mouth at bedtime.     . vitamin C (ASCORBIC ACID) 500 MG tablet Take 500 mg by mouth 2 (two) times daily.    Marland Kitchen warfarin (COUMADIN) 5 MG tablet TAKE 1 TABLET DAILY AS DIRECTED BY THE COUMADIN CLINIC 30 tablet 3  . [DISCONTINUED] pantoprazole (PROTONIX) 40 MG tablet Take 1 tablet (40 mg total) by mouth daily before breakfast.     No current facility-administered medications on file prior to visit.     Review of Systems:  As per HPI- otherwise negative.   Physical Examination: Vitals:   01/15/17 1602  BP: (!) 121/58  Pulse: 60  Temp: 98.1 F (36.7 C)   Vitals:   01/15/17 1602  Weight: 192 lb (87.1 kg)  Height: 6\' 5"  (1.956 m)   Body mass index is 22.77 kg/m. Ideal Body Weight: Weight in (lb) to have BMI = 25:  210.4  GEN: WDWN, NAD, Non-toxic, A & O x 3, tall, thin build. Looks well HEENT: Atraumatic, Normocephalic. Neck supple. No masses, No LAD. Ears and Nose: No external deformity. CV: RRR, No M/G/R. No JVD. No thrill. No extra heart sounds. PULM: CTA B, no wheezes, crackles, rhonchi. No retractions. No resp. distress. No accessory muscle use. ABD: S, NT, ND, +BS. No rebound. No HSM.  Benign belly exam EXTR: No c/c/e NEURO slow gait, uses walker PSYCH: Normally interactive. Conversant. Not depressed or anxious appearing.  Calm demeanor.    Assessment and Plan: Constipation, unspecified constipation type  Here today with constipation- perhaps related to new medication Discussed strategies to resolve this issue At this time he is still passing soft stool, does not have any pain or straining  I suspect that your constipation may be related to your new Myrbetriq medication- however this does not mean that you cannot continue to take it since it is helping you For the time being use miralax; 1-2 doses a DAY until you are passing stool normally.  Please alert me if not success by Monday  Once this occurs you can use the miralax just as needed. Certainly some people do use this every day long term and it is safe to do so If you start to develop any abdominal pain or vomiting, or other concerns please let us know.     Signed Lamar Blinks, MD

## 2017-01-15 NOTE — Progress Notes (Signed)
Pre visit review using our clinic review tool, if applicable. No additional management support is needed unless otherwise documented below in the visit note. 

## 2017-01-15 NOTE — Patient Instructions (Addendum)
I suspect that your constipation may be related to your new Myrbetriq medication- however this does not mean that you cannot continue to take it since it is helping you For the time being use miralax; 1-2 doses a DAY until you are passing stool normally.  Please alert me if not success by Monday  Once this occurs you can use the miralax just as needed. Certainly some people do use this every day long term and it is safe to do so If you start to develop any abdominal pain or vomiting, or other concerns please let us know.

## 2017-01-16 ENCOUNTER — Ambulatory Visit (INDEPENDENT_AMBULATORY_CARE_PROVIDER_SITE_OTHER): Payer: Medicare Other

## 2017-01-16 DIAGNOSIS — I440 Atrioventricular block, first degree: Secondary | ICD-10-CM

## 2017-01-26 ENCOUNTER — Telehealth: Payer: Self-pay | Admitting: *Deleted

## 2017-01-26 ENCOUNTER — Encounter: Payer: Self-pay | Admitting: Physician Assistant

## 2017-01-26 NOTE — Telephone Encounter (Signed)
Pt notified of holter monitor results and findings by phone with verbal understanding. Pt was happy about the monitor results however he did express concerns about Amiodarone. He states he has been reading the side effects. He states he notices he has a little blurriness with his eyes. Pt does wear glasses as well. Pt states he also states he just started a new urinary medication. Pt wants to know should he still be taking this and would like for [name] [name], [name] [name] and [name] [name] to see if there is maybe something else he could take. Pt denies any chest pain, sob , rash from the Amiodarone. I advised pt to continue on the Amiodarone 200 mg daily until further recommendations if any. Pt is agreeable to plan of care.

## 2017-01-27 ENCOUNTER — Telehealth: Payer: Self-pay | Admitting: *Deleted

## 2017-01-27 NOTE — Telephone Encounter (Signed)
I called the pt to go discuss recommendations per Dr. Christ Kick and Richardson Dopp, PA from our conversation yesterday. I lmom apologizing for a second call today, since it looks like [place], RN lmom a few minutes ago as well.

## 2017-01-28 ENCOUNTER — Ambulatory Visit: Payer: Medicare Other | Attending: Orthopaedic Surgery | Admitting: Physical Therapy

## 2017-01-28 ENCOUNTER — Encounter: Payer: Self-pay | Admitting: Physical Therapy

## 2017-01-28 DIAGNOSIS — M545 Low back pain: Secondary | ICD-10-CM | POA: Insufficient documentation

## 2017-01-28 DIAGNOSIS — M25611 Stiffness of right shoulder, not elsewhere classified: Secondary | ICD-10-CM

## 2017-01-28 DIAGNOSIS — R262 Difficulty in walking, not elsewhere classified: Secondary | ICD-10-CM

## 2017-01-28 DIAGNOSIS — G8929 Other chronic pain: Secondary | ICD-10-CM | POA: Insufficient documentation

## 2017-01-28 DIAGNOSIS — M25511 Pain in right shoulder: Secondary | ICD-10-CM

## 2017-01-28 NOTE — Therapy (Unsigned)
[place], [place], [address] Phone: [phone]   Fax:  [phone]  Physical Therapy Treatment  Patient Details  Name: [name] [name] MRN: [id number] Date of Birth: 23-Dec-1930 Referring Provider: Durward Fortes  Encounter Date: 01/28/2017      PT End of Session - 01/28/17 1340    Visit Number 7   PT Start Time 1300   PT Stop Time 1350   PT Time Calculation (min) 50 min   Activity Tolerance Patient tolerated treatment well   Behavior During Therapy [facility] for tasks assessed/performed      Past Medical History:  Diagnosis Date  . Abnormality of gait 05/27/2016  . Adenomatous polyps   . Carpal tunnel syndrome 06/25/2016   Right  . Depressive disorder, not elsewhere classified   . First degree AV block    Holter 3/18: NSR, PACs, PVCs, no AFib, no pauses.  Marland Kitchen Hereditary and idiopathic peripheral neuropathy 06/25/2016  . Hypertension   . Internal nasal lesion 05/15/2013  . Melanoma ([place])    Left Shoulder  . Mitral regurgitation   . MVP (mitral valve prolapse)    a. With severe MR s/p Complex valvuloplasty including artificial Gore-tex neochord placement x4, chordal transposition x1, chordal release x1, # 32 mm Sorin Memo 3D Ring Annuloplasty 2012. // b. Echo 2/18: mild LVH, EF 50-55, mild AI, MV repair with mild MR, mod LAE, mod RVE, severe RAE, severe TR  . Neuropathy ([place])   . Normal coronary arteries    a. Normal coronary anatomy by cath 2012.  . Osteoarthritis    Knees  . PAF (paroxysmal atrial fibrillation) ([place])    a. Post-op MVR 2012.  Marland Kitchen Personal history of colonic polyps   . Prostate cancer ([place])   . Pulmonary HTN    a. Mild-mod by cath 2012.  . Pure hypercholesterolemia   . PVC (premature ventricular contraction)   . Thrombocytopenia ([place])   . Vision abnormalities    Cornea scarring    Past Surgical History:  Procedure Laterality Date  . CARDIAC CATHETERIZATION  09/2011   Pre-op  for MVR -- normal coronaries.  Marland Kitchen CARDIOVERSION N/A 01/02/2016   Procedure: CARDIOVERSION;  Surgeon: Thayer Headings, MD;  Location: [facility] ENDOSCOPY;  Service: Cardiovascular;  Laterality: N/A;  . COLONOSCOPY W/ POLYPECTOMY    . INGUINAL HERNIA REPAIR  09/2009   Left  . KNEE ARTHROSCOPY      left x3  and right x2  . Melanoma Surgery     2001, 2005, 2006, 2009  . MITRAL VALVE REPAIR  10/01/2011   complex valvuloplasty with Goretex cord replacement and chordal transposition 18m Sorin Memo 3D ring annuloplasty  . Nuclear Stress Test  09/2006   EF-64%, Normal  . PROSTATECTOMY  1993  . ROOT CANAL  08-19-12  . ROTATOR CUFF REPAIR  2003   left  . TEE WITHOUT CARDIOVERSION  09/26/2011   Procedure: TRANSESOPHAGEAL ECHOCARDIOGRAM (TEE);  Surgeon: BLelon Perla MD;  [facility];  Service: Cardiovascular;  Laterality: N/A;  . UKorea[place]CHOCARDIOGRAPHY  09/2009, 08/1011   mild LVH,mild AI,MVP with mild MR, mild-mod. TR with mild Pulm. HTN, EF-55-60%    There were no vitals filed for this visit.      Subjective Assessment - 01/28/17 1303    Subjective Pt reports that his shoulder has went back a little, he stated that it has now started popping   Currently in Pain? No/denies   Pain Score  0-No pain            [facility] PT Assessment - 01/28/17 0001      AROM   AROM Assessment Site Shoulder   Right/Left Shoulder Right   Right Shoulder Flexion 94 Degrees   Right Shoulder ABduction 64 Degrees   Right Shoulder Internal Rotation 80 Degrees   Right Shoulder External Rotation 60 Degrees                     [facility] Adult PT Treatment/Exercise - 01/28/17 0001      Shoulder Exercises: Seated   Extension Strengthening;AROM;Both;Theraband;10 reps  x2   Theraband Level (Shoulder Extension) Level 3 (Green)   Row Strengthening;AROM;Both;15 reps;Theraband  x2   Theraband Level (Shoulder Row) Level 3 (Green)   External Rotation AROM;Theraband;Both;Strengthening;10 reps  x2    Theraband Level (Shoulder External Rotation) Level 1 (Yellow)   Internal Rotation 15 reps;Theraband  x2   Theraband Level (Shoulder Internal Rotation) Level 1 (Yellow)     Shoulder Exercises: ROM/Strengthening   UBE (Upper Arm Bike) L5 25mn frd/ 3 rev     Moist Heat Therapy   Number Minutes Moist Heat 10 Minutes   Moist Heat Location Shoulder     Manual Therapy   Manual Therapy Passive ROM   Manual therapy comments Pt with pan during rnd range of flexion and scaption.   Passive ROM R shoulder all directions                  PT Short Term Goals - 11/13/16 1500      PT SHORT TERM GOAL #1   Title independent with initial HEP   Time 2   Period Weeks   Status New           PT Long Term Goals - 01/28/17 1341      PT LONG TERM GOAL #1   Title understand proper posture and body mechanics   Status Achieved     PT LONG TERM GOAL #2   Title increase right shoulder AROM to 130 degrees flexion   Status On-going     PT LONG TERM GOAL #3   Title decrease pain 50%   Status Partially Met     PT LONG TERM GOAL #4   Title increase right shoulder strength to 4-/5 for ER   Status On-going               Plan - 01/28/17 1342    Clinical Impression Statement Pt returns to therapy after a month. Pt had a regression in R shoulder AROM. Difficulty with L shoulder with Tband resistance. PT RUE moves freely with PROM but does have some pain with flexion abduction, both with loose end points.   Rehab Potential Good   PT Frequency 2x / week   PT Duration 8 weeks   PT Treatment/Interventions ADLs/Self Care Home Management;Cryotherapy;Electrical Stimulation;Moist Heat;Ultrasound;Therapeutic activities;Therapeutic exercise;Balance training;Neuromuscular re-education;Manual techniques   PT Next Visit Plan increase strength below 90 degrees in shoulder flex./abd., along with scapula stabilization exercises.      Patient will benefit from skilled therapeutic intervention in  order to improve the following deficits and impairments:  Abnormal gait, Decreased activity tolerance, Decreased balance, Decreased mobility, Decreased range of motion, Decreased strength, Difficulty walking, Postural dysfunction, Pain  Visit Diagnosis: Acute pain of right shoulder  Stiffness of right shoulder, not elsewhere classified  Difficulty in walking, not elsewhere classified     Problem List Patient Active Problem List  Diagnosis Date Noted  . Varicose veins of right lower extremity with complications 24/32/7556  . Obstructive lung disease (generalized) ([place]) 09/26/2016  . Carpal tunnel syndrome 06/25/2016  . Hereditary and idiopathic peripheral neuropathy 06/25/2016  . Paresthesia 05/27/2016  . Abnormality of gait 05/27/2016  . Chest x-ray abnormality 01/09/2016  . Paroxysmal atrial flutter ([place]) 12/05/2015  . Constipation 06/13/2015  . Encounter for therapeutic drug monitoring 05/25/2014  . Syncope 04/03/2014  . Rhabdomyolysis 04/03/2014  . UTI (urinary tract infection) 04/03/2014  . Acute renal failure ([place]) 04/03/2014  . Leukocytosis 04/03/2014  . Internal nasal lesion 05/15/2013  . Low back pain 04/19/2013  . Melanoma ([place]) 09/30/2012  . Cough 03/26/2012  . Hx of mitral valve repair 11/19/2011  . Long term (current) use of anticoagulants 11/03/2011  . Decubitus skin ulcer 10/28/2011  . Pleural effusion due to congestive heart failure ([place]) 10/28/2011  . Atrial fibrillation ([place]) 10/07/2011  . S/P mitral valve repair 10/01/2011  . Valvular heart disease 08/21/2011  . Cerumen impaction 04/09/2011  . Hearing loss 04/09/2011  . THROMBOCYTOPENIA 09/19/2010  . ADENOCARCINOMA, PROSTATE 09/17/2010  . CALLUS, LEFT FOOT 09/17/2010  . BACK PAIN, CHRONIC 09/17/2010  . TINEA PEDIS 05/28/2009  . DERMATITIS, ATOPIC 04/10/2009  . HYPERCHOLESTEROLEMIA 06/09/2008  . DEPRESSION 06/09/2008  . HYPERTENSION, BENIGN ESSENTIAL 06/09/2008  . GERD 06/09/2008  .  OSTEOARTHRITIS, GENERALIZED, MULTIPLE JOINTS 06/09/2008  . MUSCLE SPASM, BACK 06/09/2008  . PERSONAL HISTORY MALIGNANT NEOPLASM PROSTATE 06/09/2008  . PERSONAL HISTORY OF MALIGNANT MELANOMA OF SKIN 06/09/2008  . ARRHYTHMIA, HX OF 06/09/2008  . Personal history of colonic polyps 06/09/2008    Scot Jun 01/28/2017, 1:46 PM  [address] [address] [place], [place], [address] Phone: [phone]   Fax:  [phone]  Name: [name] [name] MRN: [id number] Date of Birth: 04-12-1931

## 2017-01-29 ENCOUNTER — Encounter: Payer: Self-pay | Admitting: Physical Therapy

## 2017-01-29 ENCOUNTER — Ambulatory Visit: Payer: Medicare Other | Admitting: Physical Therapy

## 2017-01-29 DIAGNOSIS — M25611 Stiffness of right shoulder, not elsewhere classified: Secondary | ICD-10-CM | POA: Diagnosis present

## 2017-01-29 DIAGNOSIS — M545 Low back pain: Secondary | ICD-10-CM | POA: Diagnosis present

## 2017-01-29 DIAGNOSIS — M25511 Pain in right shoulder: Secondary | ICD-10-CM | POA: Diagnosis present

## 2017-01-29 DIAGNOSIS — R262 Difficulty in walking, not elsewhere classified: Secondary | ICD-10-CM | POA: Diagnosis present

## 2017-01-29 DIAGNOSIS — G8929 Other chronic pain: Secondary | ICD-10-CM | POA: Diagnosis present

## 2017-01-29 NOTE — Therapy (Signed)
[place], [place], [address] Phone: [phone]   Fax:  [phone]  Physical Therapy Evaluation  Patient Details  Name: [name] [name] MRN: [id number] Date of Birth: Oct 26, 1931 Referring Provider: Durward Fortes  Encounter Date: 01/29/2017      PT End of Session - 01/29/17 1729    Visit Number 8  heat   Date for PT Re-Evaluation 02/12/17   PT Start Time 1654   PT Stop Time 1740   PT Time Calculation (min) 46 min   Activity Tolerance Patient tolerated treatment well   Behavior During Therapy [facility] for tasks assessed/performed      Past Medical History:  Diagnosis Date  . Abnormality of gait 05/27/2016  . Adenomatous polyps   . Carpal tunnel syndrome 06/25/2016   Right  . Depressive disorder, not elsewhere classified   . First degree AV block    Holter 3/18: NSR, PACs, PVCs, no AFib, no pauses.  Marland Kitchen Hereditary and idiopathic peripheral neuropathy 06/25/2016  . Hypertension   . Internal nasal lesion 05/15/2013  . Melanoma ([place])    Left Shoulder  . Mitral regurgitation   . MVP (mitral valve prolapse)    a. With severe MR s/p Complex valvuloplasty including artificial Gore-tex neochord placement x4, chordal transposition x1, chordal release x1, # 32 mm Sorin Memo 3D Ring Annuloplasty 2012. // b. Echo 2/18: mild LVH, EF 50-55, mild AI, MV repair with mild MR, mod LAE, mod RVE, severe RAE, severe TR  . Neuropathy ([place])   . Normal coronary arteries    a. Normal coronary anatomy by cath 2012.  . Osteoarthritis    Knees  . PAF (paroxysmal atrial fibrillation) ([place])    a. Post-op MVR 2012.  Marland Kitchen Personal history of colonic polyps   . Prostate cancer ([place])   . Pulmonary HTN    a. Mild-mod by cath 2012.  . Pure hypercholesterolemia   . PVC (premature ventricular contraction)   . Thrombocytopenia ([place])   . Vision abnormalities    Cornea scarring    Past Surgical History:  Procedure Laterality Date   . CARDIAC CATHETERIZATION  09/2011   Pre-op for MVR -- normal coronaries.  Marland Kitchen CARDIOVERSION N/A 01/02/2016   Procedure: CARDIOVERSION;  Surgeon: Thayer Headings, MD;  Location: [facility] ENDOSCOPY;  Service: Cardiovascular;  Laterality: N/A;  . COLONOSCOPY W/ POLYPECTOMY    . INGUINAL HERNIA REPAIR  09/2009   Left  . KNEE ARTHROSCOPY      left x3  and right x2  . Melanoma Surgery     2001, 2005, 2006, 2009  . MITRAL VALVE REPAIR  10/01/2011   complex valvuloplasty with Goretex cord replacement and chordal transposition 10m Sorin Memo 3D ring annuloplasty  . Nuclear Stress Test  09/2006   EF-64%, Normal  . PROSTATECTOMY  1993  . ROOT CANAL  08-19-12  . ROTATOR CUFF REPAIR  2003   left  . TEE WITHOUT CARDIOVERSION  09/26/2011   Procedure: TRANSESOPHAGEAL ECHOCARDIOGRAM (TEE);  Surgeon: BLelon Perla MD;  [facility];  Service: Cardiovascular;  Laterality: N/A;  . UKoreaECHOCARDIOGRAPHY  09/2009, 08/1011   mild LVH,mild AI,MVP with mild MR, mild-mod. TR with mild Pulm. HTN, EF-55-60%    There were no vitals filed for this visit.       Subjective Assessment - 01/29/17 1657    Subjective Patient reports that the PROM seemed to help after the treatment yesterday, he does report a fall about  10 days ago, but did not mention that is the reason why he came back to see [place], he just reported increased pain and popping, he reports that he was putting bird seed in a feeder and he fell back ward.   Currently in Pain? Yes   Pain Score 1    Pain Location Shoulder   Pain Orientation Right   Pain Descriptors / Indicators Aching                       [facility] Adult PT Treatment/Exercise - 01/29/17 0001      Shoulder Exercises: ROM/Strengthening   UBE (Upper Arm Bike) L5 56mn frd/ 3 rev     Manual Therapy   Manual Therapy Passive ROM   Manual therapy comments Pt with pan during rnd range of flexion and scaption.   Passive ROM R shoulder all directions                 PT Education - 01/29/17 1728    Education provided Yes   Education Details Issued right shoulder isometrics all motions of the right shoulder, he did need cues and some assist to understand but he was able to do and we performed all 10 reps 3 second holds   Person(s) Educated Patient   Methods Explanation;Demonstration;Handout   Comprehension Verbalized understanding;Returned demonstration;Verbal cues required;Tactile cues required          PT Short Term Goals - 11/13/16 1500      PT SHORT TERM GOAL #1   Title independent with initial HEP   Time 2   Period Weeks   Status New           PT Long Term Goals - 01/29/17 1737      PT LONG TERM GOAL #1   Title understand proper posture and body mechanics   Status Achieved     PT LONG TERM GOAL #2   Title increase right shoulder AROM to 130 degrees flexion   Status On-going     PT LONG TERM GOAL #3   Title decrease pain 50%   Status Partially Met     PT LONG TERM GOAL #4   Title increase right shoulder strength to 4-/5 for ER   Status On-going               Plan - 01/29/17 1730    Clinical Impression Statement Patient had a fall about 10 days ago, he is having some increased c/o pain and popping.  He has lost ROM as he stopped for about a month because he was having no pain and felt like he could do on his own.  He returned to [place] with the increased pain.  We started isometrics today.   PT Frequency 1x / week   PT Duration 4 weeks   PT Treatment/Interventions ADLs/Self Care Home Management;Cryotherapy;Electrical Stimulation;Moist Heat;Ultrasound;Therapeutic activities;Therapeutic exercise;Balance training;Neuromuscular re-education;Manual techniques   PT Next Visit Plan see if isometrics, shoulder and scapular stabilization will improve his function   Consulted and Agree with Plan of Care Patient      Patient will benefit from skilled therapeutic intervention in order to improve the following deficits  and impairments:  Abnormal gait, Decreased activity tolerance, Decreased balance, Decreased mobility, Decreased range of motion, Decreased strength, Difficulty walking, Postural dysfunction, Pain  Visit Diagnosis: Acute pain of right shoulder - Plan: PT plan of care cert/re-cert  Stiffness of right shoulder, not elsewhere classified - Plan: PT plan of care  cert/re-cert  Difficulty in walking, not elsewhere classified - Plan: PT plan of care cert/re-cert     Problem List Patient Active Problem List   Diagnosis Date Noted  . Varicose veins of right lower extremity with complications 91/79/2178  . Obstructive lung disease (generalized) ([place]) 09/26/2016  . Carpal tunnel syndrome 06/25/2016  . Hereditary and idiopathic peripheral neuropathy 06/25/2016  . Paresthesia 05/27/2016  . Abnormality of gait 05/27/2016  . Chest x-ray abnormality 01/09/2016  . Paroxysmal atrial flutter ([place]) 12/05/2015  . Constipation 06/13/2015  . Encounter for therapeutic drug monitoring 05/25/2014  . Syncope 04/03/2014  . Rhabdomyolysis 04/03/2014  . UTI (urinary tract infection) 04/03/2014  . Acute renal failure ([place]) 04/03/2014  . Leukocytosis 04/03/2014  . Internal nasal lesion 05/15/2013  . Low back pain 04/19/2013  . Melanoma ([place]) 09/30/2012  . Cough 03/26/2012  . Hx of mitral valve repair 11/19/2011  . Long term (current) use of anticoagulants 11/03/2011  . Decubitus skin ulcer 10/28/2011  . Pleural effusion due to congestive heart failure ([place]) 10/28/2011  . Atrial fibrillation ([place]) 10/07/2011  . S/P mitral valve repair 10/01/2011  . Valvular heart disease 08/21/2011  . Cerumen impaction 04/09/2011  . Hearing loss 04/09/2011  . THROMBOCYTOPENIA 09/19/2010  . ADENOCARCINOMA, PROSTATE 09/17/2010  . CALLUS, LEFT FOOT 09/17/2010  . BACK PAIN, CHRONIC 09/17/2010  . TINEA PEDIS 05/28/2009  . DERMATITIS, ATOPIC 04/10/2009  . HYPERCHOLESTEROLEMIA 06/09/2008  . DEPRESSION 06/09/2008  .  HYPERTENSION, BENIGN ESSENTIAL 06/09/2008  . GERD 06/09/2008  . OSTEOARTHRITIS, GENERALIZED, MULTIPLE JOINTS 06/09/2008  . MUSCLE SPASM, BACK 06/09/2008  . PERSONAL HISTORY MALIGNANT NEOPLASM PROSTATE 06/09/2008  . PERSONAL HISTORY OF MALIGNANT MELANOMA OF SKIN 06/09/2008  . ARRHYTHMIA, HX OF 06/09/2008  . Personal history of colonic polyps 06/09/2008    Sumner Boast., PT 01/29/2017, 5:40 PM  [place] [address] [place], [place], [address] Phone: [phone]   Fax:  [phone]  Name: REQUAN HARDGE MRN: [id number] Date of Birth: 08-04-1931

## 2017-02-02 ENCOUNTER — Encounter: Payer: Self-pay | Admitting: Physical Therapy

## 2017-02-02 ENCOUNTER — Ambulatory Visit: Payer: Medicare Other | Admitting: Physical Therapy

## 2017-02-02 DIAGNOSIS — M25511 Pain in right shoulder: Secondary | ICD-10-CM

## 2017-02-02 DIAGNOSIS — M25611 Stiffness of right shoulder, not elsewhere classified: Secondary | ICD-10-CM

## 2017-02-02 DIAGNOSIS — R262 Difficulty in walking, not elsewhere classified: Secondary | ICD-10-CM

## 2017-02-02 NOTE — Therapy (Signed)
[address] [place], [place], [address] Phone: [phone]   Fax:  [phone]  Physical Therapy Treatment  Patient Details  Name: [name] [name] MRN: [id number] Date of Birth: 01/24/1931 Referring Provider: Durward Fortes  Encounter Date: 02/02/2017      PT End of Session - 02/02/17 1337    Visit Number 9   Date for PT Re-Evaluation 02/27/17   PT Start Time 6962   PT Stop Time 1350   PT Time Calculation (min) 45 min   Activity Tolerance Patient tolerated treatment well   Behavior During Therapy [facility] for tasks assessed/performed      Past Medical History:  Diagnosis Date  . Abnormality of gait 05/27/2016  . Adenomatous polyps   . Carpal tunnel syndrome 06/25/2016   Right  . Depressive disorder, not elsewhere classified   . First degree AV block    Holter 3/18: NSR, PACs, PVCs, no AFib, no pauses.  Marland Kitchen Hereditary and idiopathic peripheral neuropathy 06/25/2016  . Hypertension   . Internal nasal lesion 05/15/2013  . Melanoma ([place])    Left Shoulder  . Mitral regurgitation   . MVP (mitral valve prolapse)    a. With severe MR s/p Complex valvuloplasty including artificial Gore-tex neochord placement x4, chordal transposition x1, chordal release x1, # 32 mm Sorin Memo 3D Ring Annuloplasty 2012. // b. Echo 2/18: mild LVH, EF 50-55, mild AI, MV repair with mild MR, mod LAE, mod RVE, severe RAE, severe TR  . Neuropathy ([place])   . Normal coronary arteries    a. Normal coronary anatomy by cath 2012.  . Osteoarthritis    Knees  . PAF (paroxysmal atrial fibrillation) ([place])    a. Post-op MVR 2012.  Marland Kitchen Personal history of colonic polyps   . Prostate cancer ([place])   . Pulmonary HTN    a. Mild-mod by cath 2012.  . Pure hypercholesterolemia   . PVC (premature ventricular contraction)   . Thrombocytopenia ([place])   . Vision abnormalities    Cornea scarring    Past Surgical History:  Procedure Laterality Date  .  CARDIAC CATHETERIZATION  09/2011   Pre-op for MVR -- normal coronaries.  Marland Kitchen CARDIOVERSION N/A 01/02/2016   Procedure: CARDIOVERSION;  Surgeon: Thayer Headings, MD;  Location: [facility] ENDOSCOPY;  Service: Cardiovascular;  Laterality: N/A;  . COLONOSCOPY W/ POLYPECTOMY    . INGUINAL HERNIA REPAIR  09/2009   Left  . KNEE ARTHROSCOPY      left x3  and right x2  . Melanoma Surgery     2001, 2005, 2006, 2009  . MITRAL VALVE REPAIR  10/01/2011   complex valvuloplasty with Goretex cord replacement and chordal transposition 31m Sorin Memo 3D ring annuloplasty  . Nuclear Stress Test  09/2006   EF-64%, Normal  . PROSTATECTOMY  1993  . ROOT CANAL  08-19-12  . ROTATOR CUFF REPAIR  2003   left  . TEE WITHOUT CARDIOVERSION  09/26/2011   Procedure: TRANSESOPHAGEAL ECHOCARDIOGRAM (TEE);  Surgeon: BLelon Perla MD;  [facility];  Service: Cardiovascular;  Laterality: N/A;  . UKoreaECHOCARDIOGRAPHY  09/2009, 08/1011   mild LVH,mild AI,MVP with mild MR, mild-mod. TR with mild Pulm. HTN, EF-55-60%    There were no vitals filed for this visit.      Subjective Assessment - 02/02/17 1308    Subjective "I don't feel that great, my knees and my legs, just takes forever to get anything done" Pt reports that the  exercises at home causes  some pain but he can do them.   Currently in Pain? No/denies   Pain Score 0-No pain                         [facility] Adult PT Treatment/Exercise - 02/02/17 0001      Shoulder Exercises: Supine   External Rotation 10 reps;Theraband;AROM   External Rotation Weight (lbs) 0   Internal Rotation Right;10 reps;Weights   Internal Rotation Weight (lbs) 1   Other Supine Exercises isometric holds supine arm flexed to 90 3x10sec; 3 chest press with RUE only no weigt   Other Supine Exercises RUE AAROM flex slow going gown 3x5      Shoulder Exercises: ROM/Strengthening   UBE (Upper Arm Bike) L5 57mn frd/ 3 rev     Moist Heat Therapy   Number Minutes Moist  Heat 10 Minutes   Moist Heat Location Shoulder     Manual Therapy   Manual Therapy Passive ROM   Manual therapy comments Pt with pan during rnd range of flexion and scaption.   Passive ROM R shoulder all directions                  PT Short Term Goals - 11/13/16 1500      PT SHORT TERM GOAL #1   Title independent with initial HEP   Time 2   Period Weeks   Status New           PT Long Term Goals - 01/29/17 1737      PT LONG TERM GOAL #1   Title understand proper posture and body mechanics   Status Achieved     PT LONG TERM GOAL #2   Title increase right shoulder AROM to 130 degrees flexion   Status On-going     PT LONG TERM GOAL #3   Title decrease pain 50%   Status Partially Met     PT LONG TERM GOAL #4   Title increase right shoulder strength to 4-/5 for ER   Status On-going               Plan - 02/02/17 1337    Clinical Impression Statement Pt with good R shoulder external and internal rotation actively with little main. Pt is very weak with flexion requiring AAROM to complete. Pt R shoulder moves well with PROM, pt is guarded at times during PROM causing ore pain at times. Pt R shoulder fatigues quick with ceiling punches and eccentric flexion movements.   Rehab Potential Good   PT Frequency 1x / week   PT Duration 4 weeks   PT Treatment/Interventions ADLs/Self Care Home Management;Cryotherapy;Electrical Stimulation;Moist Heat;Ultrasound;Therapeutic activities;Therapeutic exercise;Balance training;Neuromuscular re-education;Manual techniques   PT Next Visit Plan R shoulder strengthening in supine position.      Patient will benefit from skilled therapeutic intervention in order to improve the following deficits and impairments:  Abnormal gait, Decreased activity tolerance, Decreased balance, Decreased mobility, Decreased range of motion, Decreased strength, Difficulty walking, Postural dysfunction, Pain  Visit Diagnosis: Acute pain of right  shoulder  Stiffness of right shoulder, not elsewhere classified  Difficulty in walking, not elsewhere classified     Problem List Patient Active Problem List   Diagnosis Date Noted  . Varicose veins of right lower extremity with complications 141/93/7902 . Obstructive lung disease (generalized) ([place] 09/26/2016  . Carpal tunnel syndrome 06/25/2016  . Hereditary and idiopathic peripheral neuropathy 06/25/2016  . Paresthesia 05/27/2016  .  Abnormality of gait 05/27/2016  . Chest x-ray abnormality 01/09/2016  . Paroxysmal atrial flutter ([place]) 12/05/2015  . Constipation 06/13/2015  . Encounter for therapeutic drug monitoring 05/25/2014  . Syncope 04/03/2014  . Rhabdomyolysis 04/03/2014  . UTI (urinary tract infection) 04/03/2014  . Acute renal failure ([place]) 04/03/2014  . Leukocytosis 04/03/2014  . Internal nasal lesion 05/15/2013  . Low back pain 04/19/2013  . Melanoma ([place]) 09/30/2012  . Cough 03/26/2012  . Hx of mitral valve repair 11/19/2011  . Long term (current) use of anticoagulants 11/03/2011  . Decubitus skin ulcer 10/28/2011  . Pleural effusion due to congestive heart failure ([place]) 10/28/2011  . Atrial fibrillation ([place]) 10/07/2011  . S/P mitral valve repair 10/01/2011  . Valvular heart disease 08/21/2011  . Cerumen impaction 04/09/2011  . Hearing loss 04/09/2011  . THROMBOCYTOPENIA 09/19/2010  . ADENOCARCINOMA, PROSTATE 09/17/2010  . CALLUS, LEFT FOOT 09/17/2010  . BACK PAIN, CHRONIC 09/17/2010  . TINEA PEDIS 05/28/2009  . DERMATITIS, ATOPIC 04/10/2009  . HYPERCHOLESTEROLEMIA 06/09/2008  . DEPRESSION 06/09/2008  . HYPERTENSION, BENIGN ESSENTIAL 06/09/2008  . GERD 06/09/2008  . OSTEOARTHRITIS, GENERALIZED, MULTIPLE JOINTS 06/09/2008  . MUSCLE SPASM, BACK 06/09/2008  . PERSONAL HISTORY MALIGNANT NEOPLASM PROSTATE 06/09/2008  . PERSONAL HISTORY OF MALIGNANT MELANOMA OF SKIN 06/09/2008  . ARRHYTHMIA, HX OF 06/09/2008  . Personal history of colonic polyps  06/09/2008    Scot Jun, PTA 02/02/2017, 1:41 PM  [address] [place], [place], [address] Phone: [phone]   Fax:  [phone]  Name: [name] [name] MRN: [id number] Date of Birth: [birth year]

## 2017-02-05 ENCOUNTER — Ambulatory Visit: Payer: Medicare Other | Admitting: Physical Therapy

## 2017-02-05 ENCOUNTER — Telehealth: Payer: Self-pay | Admitting: *Deleted

## 2017-02-05 ENCOUNTER — Telehealth: Payer: Self-pay | Admitting: Vascular Surgery

## 2017-02-05 ENCOUNTER — Other Ambulatory Visit: Payer: Self-pay | Admitting: *Deleted

## 2017-02-05 DIAGNOSIS — I83811 Varicose veins of right lower extremities with pain: Secondary | ICD-10-CM

## 2017-02-05 NOTE — Telephone Encounter (Signed)
Pt called in c/o swelling in his right leg in spite of wearing compression stockings for the past 3-4 weeks and he wants Dr. Kellie Simmering to look at hi. No pain or foot swelling, just heaviness. Put in a staff message to schedule a reflux study and JDL visit soon.

## 2017-02-05 NOTE — Telephone Encounter (Signed)
Rolla Flatten, RN  P [facility] Admin Pool        [name], 04/18/2031, [id number] Had a laser 1/16 and says his R leg is swelling and feels heavy for the past 3-4 weeks, no pain. He wants to be seen again by JDL. Please sched a reflux study and JDL asap. It's not an emergency (like a DVT) but he is worried about it. Thx!

## 2017-02-05 NOTE — Telephone Encounter (Signed)
Pt called in c/o swelling for the last 3-4 weeks. Wants to see Dr. Kellie Simmering.

## 2017-02-05 NOTE — Telephone Encounter (Signed)
Sched lab 02/17/17 at 2:30 and MD 02/23/17 at 1:30. Spoke to pt to inform pt of appts.

## 2017-02-06 ENCOUNTER — Ambulatory Visit (INDEPENDENT_AMBULATORY_CARE_PROVIDER_SITE_OTHER): Payer: Medicare Other | Admitting: Pharmacist

## 2017-02-06 DIAGNOSIS — Z5181 Encounter for therapeutic drug level monitoring: Secondary | ICD-10-CM | POA: Diagnosis not present

## 2017-02-06 DIAGNOSIS — I4891 Unspecified atrial fibrillation: Secondary | ICD-10-CM | POA: Diagnosis not present

## 2017-02-06 LAB — POCT INR: INR: 3.3

## 2017-02-10 ENCOUNTER — Telehealth (INDEPENDENT_AMBULATORY_CARE_PROVIDER_SITE_OTHER): Payer: Self-pay | Admitting: *Deleted

## 2017-02-10 NOTE — Telephone Encounter (Signed)
Is this something we would do?

## 2017-02-10 NOTE — Telephone Encounter (Signed)
Pt daughter calling asking for a referral and an order for in home health care. Pt daughter stated he is already set up with [facility] [phone]

## 2017-02-11 ENCOUNTER — Encounter: Payer: Self-pay | Admitting: Vascular Surgery

## 2017-02-11 ENCOUNTER — Encounter: Payer: Self-pay | Admitting: Physical Therapy

## 2017-02-11 ENCOUNTER — Ambulatory Visit: Payer: Medicare Other | Admitting: Physical Therapy

## 2017-02-11 DIAGNOSIS — R262 Difficulty in walking, not elsewhere classified: Secondary | ICD-10-CM

## 2017-02-11 DIAGNOSIS — M25511 Pain in right shoulder: Secondary | ICD-10-CM

## 2017-02-11 DIAGNOSIS — G8929 Other chronic pain: Secondary | ICD-10-CM

## 2017-02-11 DIAGNOSIS — M25611 Stiffness of right shoulder, not elsewhere classified: Secondary | ICD-10-CM

## 2017-02-11 DIAGNOSIS — M545 Low back pain: Secondary | ICD-10-CM

## 2017-02-11 NOTE — Therapy (Addendum)
[place], [place], [address] Phone: [phone]   Fax:  [phone]  Physical Therapy Treatment  Patient Details  Name: DEDDRICK SAINDON MRN: [id number] Date of Birth: 13-Mar-1931 Referring Provider: Durward Fortes  Encounter Date: 02/11/2017      PT End of Session - 02/11/17 1228    Visit Number 10   Date for PT Re-Evaluation 02/27/17   PT Start Time 2500   PT Stop Time 1236   PT Time Calculation (min) 51 min   Activity Tolerance Patient tolerated treatment well;Patient limited by pain   Behavior During Therapy [facility] for tasks assessed/performed      Past Medical History:  Diagnosis Date  . Abnormality of gait 05/27/2016  . Adenomatous polyps   . Carpal tunnel syndrome 06/25/2016   Right  . Depressive disorder, not elsewhere classified   . First degree AV block    Holter 3/18: NSR, PACs, PVCs, no AFib, no pauses.  Marland Kitchen Hereditary and idiopathic peripheral neuropathy 06/25/2016  . Hypertension   . Internal nasal lesion 05/15/2013  . Melanoma ([place])    Left Shoulder  . Mitral regurgitation   . MVP (mitral valve prolapse)    a. With severe MR s/p Complex valvuloplasty including artificial Gore-tex neochord placement x4, chordal transposition x1, chordal release x1, # 32 mm Sorin Memo 3D Ring Annuloplasty 2012. // b. Echo 2/18: mild LVH, EF 50-55, mild AI, MV repair with mild MR, mod LAE, mod RVE, severe RAE, severe TR  . Neuropathy ([place])   . Normal coronary arteries    a. Normal coronary anatomy by cath 2012.  . Osteoarthritis    Knees  . PAF (paroxysmal atrial fibrillation) ([place])    a. Post-op MVR 2012.  Marland Kitchen Personal history of colonic polyps   . Prostate cancer ([place])   . Pulmonary HTN    a. Mild-mod by cath 2012.  . Pure hypercholesterolemia   . PVC (premature ventricular contraction)   . Thrombocytopenia ([place])   . Vision abnormalities    Cornea scarring    Past Surgical History:  Procedure  Laterality Date  . CARDIAC CATHETERIZATION  09/2011   Pre-op for MVR -- normal coronaries.  Marland Kitchen CARDIOVERSION N/A 01/02/2016   Procedure: CARDIOVERSION;  Surgeon: Thayer Headings, MD;  Location: [facility] ENDOSCOPY;  Service: Cardiovascular;  Laterality: N/A;  . COLONOSCOPY W/ POLYPECTOMY    . INGUINAL HERNIA REPAIR  09/2009   Left  . KNEE ARTHROSCOPY      left x3  and right x2  . Melanoma Surgery     2001, 2005, 2006, 2009  . MITRAL VALVE REPAIR  10/01/2011   complex valvuloplasty with Goretex cord replacement and chordal transposition 13m Sorin Memo 3D ring annuloplasty  . Nuclear Stress Test  09/2006   EF-64%, Normal  . PROSTATECTOMY  1993  . ROOT CANAL  08-19-12  . ROTATOR CUFF REPAIR  2003   left  . TEE WITHOUT CARDIOVERSION  09/26/2011   Procedure: TRANSESOPHAGEAL ECHOCARDIOGRAM (TEE);  Surgeon: BLelon Perla MD;  [facility];  Service: Cardiovascular;  Laterality: N/A;  . UKoreaECHOCARDIOGRAPHY  09/2009, 08/1011   mild LVH,mild AI,MVP with mild MR, mild-mod. TR with mild Pulm. HTN, EF-55-60%    There were no vitals filed for this visit.      Subjective Assessment - 02/11/17 1147    Subjective "Not too bad"   Currently in Pain? No/denies   Pain Score 0-No pain  [facility] Adult PT Treatment/Exercise - 02/11/17 0001      Shoulder Exercises: Supine   External Rotation 10 reps;Theraband;AROM   External Rotation Weight (lbs) 1   Internal Rotation Right;10 reps;Weights   Internal Rotation Weight (lbs) 1   Flexion AAROM;Strengthening;Right;5 reps  x4, therapist assist     Shoulder Exercises: Seated   Extension Strengthening;AROM;Both;Theraband;10 reps  x2   Theraband Level (Shoulder Extension) Level 2 (Red)   Row 15 reps;Theraband  x2   Theraband Level (Shoulder Row) Level 3 (Green)     Shoulder Exercises: ROM/Strengthening   UBE (Upper Arm Bike) L5 50mn frd/ 3 rev                  PT Short Term Goals - 11/13/16  1500      PT SHORT TERM GOAL #1   Title independent with initial HEP   Time 2   Period Weeks   Status New           PT Long Term Goals - 01/29/17 1737      PT LONG TERM GOAL #1   Title understand proper posture and body mechanics   Status Achieved     PT LONG TERM GOAL #2   Title increase right shoulder AROM to 130 degrees flexion   Status On-going     PT LONG TERM GOAL #3   Title decrease pain 50%   Status Partially Met     PT LONG TERM GOAL #4   Title increase right shoulder strength to 4-/5 for ER   Status On-going               Plan - 02/11/17 1229    Clinical Impression Statement Pt able to tolerated 1lb resisted in supine with internal and external rotation. weakness displayed when asked to perform r shoulder flexion, does reports pain with AAROM with flexion. Continuous cues required to perform proper tband Rows and extensions.   Rehab Potential Good   PT Frequency 1x / week   PT Duration 4 weeks   PT Treatment/Interventions ADLs/Self Care Home Management;Cryotherapy;Electrical Stimulation;Moist Heat;Ultrasound;Therapeutic activities;Therapeutic exercise;Balance training;Neuromuscular re-education;Manual techniques   PT Next Visit Plan R shoulder strengthening in supine position.      Patient will benefit from skilled therapeutic intervention in order to improve the following deficits and impairments:  Abnormal gait, Decreased activity tolerance, Decreased balance, Decreased mobility, Decreased range of motion, Decreased strength, Difficulty walking, Postural dysfunction, Pain  Visit Diagnosis: Acute pain of right shoulder  Stiffness of right shoulder, not elsewhere classified  Difficulty in walking, not elsewhere classified  Chronic midline low back pain without sciatica     Problem List Patient Active Problem List   Diagnosis Date Noted  . Varicose veins of right lower extremity with complications 135/36/1443 . Obstructive lung disease  (generalized) ([place] 09/26/2016  . Carpal tunnel syndrome 06/25/2016  . Hereditary and idiopathic peripheral neuropathy 06/25/2016  . Paresthesia 05/27/2016  . Abnormality of gait 05/27/2016  . Chest x-ray abnormality 01/09/2016  . Paroxysmal atrial flutter ([place] 12/05/2015  . Constipation 06/13/2015  . Encounter for therapeutic drug monitoring 05/25/2014  . Syncope 04/03/2014  . Rhabdomyolysis 04/03/2014  . UTI (urinary tract infection) 04/03/2014  . Acute renal failure ([place] 04/03/2014  . Leukocytosis 04/03/2014  . Internal nasal lesion 05/15/2013  . Low back pain 04/19/2013  . Melanoma ([place] 09/30/2012  . Cough 03/26/2012  . Hx of mitral valve repair 11/19/2011  . Long term (current) use of anticoagulants 11/03/2011  .  Decubitus skin ulcer 10/28/2011  . Pleural effusion due to congestive heart failure ([place]) 10/28/2011  . Atrial fibrillation ([place]) 10/07/2011  . S/P mitral valve repair 10/01/2011  . Valvular heart disease 08/21/2011  . Cerumen impaction 04/09/2011  . Hearing loss 04/09/2011  . THROMBOCYTOPENIA 09/19/2010  . ADENOCARCINOMA, PROSTATE 09/17/2010  . CALLUS, LEFT FOOT 09/17/2010  . BACK PAIN, CHRONIC 09/17/2010  . TINEA PEDIS 05/28/2009  . DERMATITIS, ATOPIC 04/10/2009  . HYPERCHOLESTEROLEMIA 06/09/2008  . DEPRESSION 06/09/2008  . HYPERTENSION, BENIGN ESSENTIAL 06/09/2008  . GERD 06/09/2008  . OSTEOARTHRITIS, GENERALIZED, MULTIPLE JOINTS 06/09/2008  . MUSCLE SPASM, BACK 06/09/2008  . PERSONAL HISTORY MALIGNANT NEOPLASM PROSTATE 06/09/2008  . PERSONAL HISTORY OF MALIGNANT MELANOMA OF SKIN 06/09/2008  . ARRHYTHMIA, HX OF 06/09/2008  . Personal history of colonic polyps 06/09/2008    PHYSICAL THERAPY DISCHARGE SUMMARY  Visits from Start of Care: 10  Plan: Patient agrees to discharge.  Patient goals were partially met. Patient is being discharged due to being pleased with the current functional level.  ?????      Scot Jun, PTA 02/11/2017, 12:35  PM  [address] [place], [place], [address] Phone: [phone]   Fax:  [phone]  Name: OLGA SEYLER MRN: [id number] Date of Birth: 03-Mar-1931

## 2017-02-11 NOTE — Telephone Encounter (Signed)
Need more specific info re what they actually want

## 2017-02-12 NOTE — Telephone Encounter (Signed)
Called Ms. [name] [name] and told her we do not refer for home health caregivers. Pt was very understanding

## 2017-02-17 ENCOUNTER — Ambulatory Visit (HOSPITAL_COMMUNITY)
Admission: RE | Admit: 2017-02-17 | Discharge: 2017-02-17 | Disposition: A | Discharge status: Home / Self Care | Payer: Medicare Other | Source: Ambulatory Visit | Attending: Vascular Surgery | Admitting: Vascular Surgery

## 2017-02-17 DIAGNOSIS — M7989 Other specified soft tissue disorders: Secondary | ICD-10-CM | POA: Diagnosis present

## 2017-02-17 DIAGNOSIS — I83811 Varicose veins of right lower extremities with pain: Secondary | ICD-10-CM | POA: Diagnosis not present

## 2017-02-17 NOTE — Progress Notes (Signed)
Cardiology Office Note   Date:  02/18/2017   ID:  [name] [name], DOB 10/30/1931, MRN [id number]  PCP:  Penni Homans, MD    No chief complaint on file. f/u AFib   Wt Readings from Last 3 Encounters:  02/18/17 202 lb (91.6 kg)  01/15/17 192 lb (87.1 kg)  01/09/17 191 lb (86.6 kg)       History of Present Illness: [name] [name] is a 83 y.o. male  followed in the past by Dr. Mare Ferrari. His past cardiac history is notable for PAF/Flutter. He is on chronic anticoagulation with Coumadin. He also has a h/o severe mitral valve prolapse. He underwent mitral valve repair by Dr. Roxy Manns on 10/01/11. He did not have any coronary disease. In May 2015 the patient had an episode of syncope at home secondary to dehydration and a urinary tract infection. He had atrial fibrillation recurrence felt to be secondary to the stress of the UTI. He subsequently went back in normal sinus rhythm and was continued on warfarin.   The patient was seen on 12/05/15 and at that time he was noted to be back in atrial flutter with variable ventricular response. 2D echo was obtained showingan EF of 45-50% with G2DD. There was grade 2 diastolic dysfunction. Mitral valve shows satisfactory function with mild stenosis and mild regurgitation.The left atrium was mildly dilated. The right atrium was severely dilated. He was started on amiodarone. His warfarin was continued.He did not convert on amiodarone. Therefore outpatient cardioversion was arranged.On 01/02/16, the patient underwent synchronized cardioversion using 50 J by Dr. Acie Fredrickson with successful cardioversion to normal sinus rhythm from atrial flutter.  Also of note, he had a recent CXR in 12/2015 which showed decreased aeration at the base and a follow-up chest 2 x-ray was recommended to assure clearing. Dr. Mare Ferrari arranged for this to be done. F/u CXR was done 02/07/16. Radiologist reports is as follows:  IMPRESSION: 1. Stable opacity at the left lung base  overlying the left heart border, with additional stable blunting of the adjacent left costophrenic angle. As described on the earlier chest x-ray report, this could represent infiltrate and adjacent pleural effusion, but underlying mass not excluded. Another consideration would be chronic pleural thickening/scarring. Given the persistence since chest x-ray of 12/27/2015, would consider chest CT at this point to exclude the possibility of neoplastic mass. 2. Hyperexpanded lungs suggesting COPD.    He has been fully compliant with Coumadin. No significant abnormal bleeding, other than bruising. His INR is followed in our [facility].   Had a negative chest CT scan for malignancy.  Still has atelectasis on his most recent CXray.  No SHOB.    He denies any symptoms of breakthrough atrial fibrillation since his cardioversion or on Amio 200 mg daily. No palpitations, dyspnea or chest pain. He also denies wheezing and productive cough. He denies any prior h/o tobacco abuse. No previous occupational exposures.  He had worsening leg swelling in January.  He had laser surgery of the right leg.  THe left leg has increased in swelling and the skin is seeping some fluid.  Echo showed severe TR and dilated RV.    Past Medical History:  Diagnosis Date  . Abnormality of gait 05/27/2016  . Adenomatous polyps   . Carpal tunnel syndrome 06/25/2016   Right  . Depressive disorder, not elsewhere classified   . First degree AV block    Holter 3/18: NSR, PACs, PVCs, no AFib, no pauses.  Marland Kitchen Hereditary and  idiopathic peripheral neuropathy 06/25/2016  . Hypertension   . Internal nasal lesion 05/15/2013  . Melanoma ([place])    Left Shoulder  . Mitral regurgitation   . MVP (mitral valve prolapse)    a. With severe MR s/p Complex valvuloplasty including artificial Gore-tex neochord placement x4, chordal transposition x1, chordal release x1, # 32 mm Sorin Memo 3D Ring Annuloplasty 2012. // b. Echo 2/18: mild LVH,  EF 50-55, mild AI, MV repair with mild MR, mod LAE, mod RVE, severe RAE, severe TR  . Neuropathy ([place])   . Normal coronary arteries    a. Normal coronary anatomy by cath 2012.  . Osteoarthritis    Knees  . PAF (paroxysmal atrial fibrillation) ([place])    a. Post-op MVR 2012.  Marland Kitchen Personal history of colonic polyps   . Prostate cancer ([place])   . Pulmonary HTN    a. Mild-mod by cath 2012.  . Pure hypercholesterolemia   . PVC (premature ventricular contraction)   . Thrombocytopenia ([place])   . Vision abnormalities    Cornea scarring    Past Surgical History:  Procedure Laterality Date  . CARDIAC CATHETERIZATION  09/2011   Pre-op for MVR -- normal coronaries.  Marland Kitchen CARDIOVERSION N/A 01/02/2016   Procedure: CARDIOVERSION;  Surgeon: Thayer Headings, MD;  Location: [facility] ENDOSCOPY;  Service: Cardiovascular;  Laterality: N/A;  . COLONOSCOPY W/ POLYPECTOMY    . INGUINAL HERNIA REPAIR  09/2009   Left  . KNEE ARTHROSCOPY      left x3  and right x2  . Melanoma Surgery     2001, 2005, 2006, 2009  . MITRAL VALVE REPAIR  10/01/2011   complex valvuloplasty with Goretex cord replacement and chordal transposition 49mm Sorin Memo 3D ring annuloplasty  . Nuclear Stress Test  09/2006   EF-64%, Normal  . PROSTATECTOMY  1993  . ROOT CANAL  08-19-12  . ROTATOR CUFF REPAIR  2003   left  . TEE WITHOUT CARDIOVERSION  09/26/2011   Procedure: TRANSESOPHAGEAL ECHOCARDIOGRAM (TEE);  Surgeon: Lelon Perla, MD;  Location[facility] ENDOSCOPY;  Service: Cardiovascular;  Laterality: N/A;  . US ECHOCARDIOGRAPHY  09/2009, 08/1011   mild LVH,mild AI,MVP with mild MR, mild-mod. TR with mild Pulm. HTN, EF-55-60%     Current Outpatient Prescriptions  Medication Sig Dispense Refill  . CARTIA XT 300 MG 24 hr capsule Take 300 mg by mouth daily.     . furosemide (LASIX) 40 MG tablet Take 40 mg by mouth every other day.     . gabapentin (NEURONTIN) 300 MG capsule Take 300 mg by mouth 2 (two) times daily.     Marland Kitchen MYRBETRIQ 50 MG  TB24 tablet Take 1 tablet by mouth daily.    . potassium chloride SA (K-DUR,KLOR-CON) 20 MEQ tablet Take 1 tablet (20 mEq total) by mouth daily. 90 tablet 2  . rosuvastatin (CRESTOR) 5 MG tablet Take 5 mg by mouth daily at 6 PM.     . TOPROL XL 50 MG 24 hr tablet Take 50 mg by mouth daily.     . traZODone (DESYREL) 100 MG tablet Take 300 mg by mouth at bedtime.     . vitamin C (ASCORBIC ACID) 500 MG tablet Take 500 mg by mouth 2 (two) times daily.    Marland Kitchen warfarin (COUMADIN) 5 MG tablet TAKE 1 TABLET DAILY AS DIRECTED BY THE COUMADIN CLINIC 30 tablet 3  . amiodarone (PACERONE) 200 MG tablet Take 200 mg by mouth daily.      No current  facility-administered medications for this visit.     Allergies:   Patient has no known allergies.    Social History:  The patient  reports that he has never smoked. He has never used smokeless tobacco. He reports that he does not drink alcohol or use drugs.   Family History:  The patient's family history includes Arthritis in his mother; Clotting disorder in his brother; Hypertension in his father and mother; Psychosis in his father; Stroke in his mother.    ROS:  Please see the history of present illness.   Otherwise, review of systems are positive for swelling in his legs when he misses his Lasix.   All other systems are reviewed and negative.    PHYSICAL EXAM: VS:  BP 120/80   Pulse 64   Ht 6\' 5"  (1.956 m)   Wt 202 lb (91.6 kg)   BMI 23.95 kg/m  , BMI Body mass index is 23.95 kg/m. GEN: Well nourished, well developed, in no acute distress  HEENT: normal  Neck: elevated JVD, no carotid bruits, or masses Cardiac: RRR; no murmurs, rubs, or gallops,no edema  Respiratory:  clear to auscultation bilaterally, normal work of breathing GI: soft, nontender, nondistended, + BS MS: no deformity or atrophy  Skin: warm and dry, no rash Neuro:  Strength and sensation are intact Psych: euthymic mood, full affect   EKG:   The ekg ordered today demonstrates  NSR, no ST segment changes   Recent Labs: 08/25/2016: ALT 22; TSH 0.73 01/09/2017: BUN 25; Creatinine, Ser 1.07; Potassium 5.1; Sodium 143   Lipid Panel    Component Value Date/Time   CHOL 112 (L) 11/02/2015 1048   TRIG 66 11/02/2015 1048   HDL 49 11/02/2015 1048   CHOLHDL 2.3 11/02/2015 1048   VLDL 13 11/02/2015 1048   LDLCALC 50 11/02/2015 1048     Other studies Reviewed: Additional studies/ records that were reviewed today with results demonstrating: echo in 2/18 as resulted above.   ASSESSMENT AND PLAN:  1. PAF: Maintainting NSR on amiodarone 200 mg daily.  Coumadin for stroke prevention. 2. Systolic and diastolic heart failure: chronic:  Worsening leg swelling is a sx of right heart failure. Prominent neck veins as well. Related to TR and RV dysfunction.  He will increase Lasix dose to 40 mg daily.  No signs of left sided heart failure.  BMet in one week. 3. Anticoagulation: Warfarin for stroke prevention. 4. Try to get more exercise to help with venous return.  Elevate legs at night.  Compression stockings when his leg sizes allow.  He is not going to be a good candidate for tricuspid valve repair given his age and other comorbidities.  He will f/u with Dr. Kellie Simmering as well for venous insufficiency.    Current medicines are reviewed at length with the patient today.  The patient concerns regarding his medicines were addressed.  The following changes have been made:  Increase Lasix  Labs/ tests ordered today include:   No orders of the defined types were placed in this encounter.   Recommend 150 minutes/week of aerobic exercise if this can be done safely Low fat, low carb, high fiber diet recommended  Disposition:   FU in 3-4 weeks per the patient's request   Signed, Larae Grooms, MD  02/18/2017 10:04 AM    [facility] [address], [place], [place]  [address] Phone: [phone]; Fax: [phone]

## 2017-02-18 ENCOUNTER — Encounter: Payer: Self-pay | Admitting: Interventional Cardiology

## 2017-02-18 ENCOUNTER — Ambulatory Visit (INDEPENDENT_AMBULATORY_CARE_PROVIDER_SITE_OTHER): Payer: Medicare Other | Admitting: Interventional Cardiology

## 2017-02-18 VITALS — BP 120/80 | HR 64 | Ht 77.0 in | Wt 202.0 lb

## 2017-02-18 DIAGNOSIS — Z7901 Long term (current) use of anticoagulants: Secondary | ICD-10-CM | POA: Diagnosis not present

## 2017-02-18 DIAGNOSIS — M7989 Other specified soft tissue disorders: Secondary | ICD-10-CM | POA: Diagnosis not present

## 2017-02-18 DIAGNOSIS — I4819 Other persistent atrial fibrillation: Secondary | ICD-10-CM

## 2017-02-18 DIAGNOSIS — I5042 Chronic combined systolic (congestive) and diastolic (congestive) heart failure: Secondary | ICD-10-CM | POA: Diagnosis not present

## 2017-02-18 DIAGNOSIS — I481 Persistent atrial fibrillation: Secondary | ICD-10-CM | POA: Diagnosis not present

## 2017-02-18 DIAGNOSIS — I83811 Varicose veins of right lower extremities with pain: Secondary | ICD-10-CM

## 2017-02-18 DIAGNOSIS — Z79899 Other long term (current) drug therapy: Secondary | ICD-10-CM | POA: Diagnosis not present

## 2017-02-18 MED ORDER — FUROSEMIDE 40 MG PO TABS: 40.0000 mg | ORAL_TABLET | Freq: Every day | ORAL | 3 refills | Status: DC

## 2017-02-18 NOTE — Patient Instructions (Signed)
Medication Instructions:  INCREASE Lasix to 40 mg daily.  Labwork: Your physician recommends that you return for lab work on: Monday 02/23/2017 for BMET.   Testing/Procedures: None.  Follow-Up: Your physician recommends that you schedule a follow-up appointment in: 1 month with Dr. Irish Lack.   Any Other Special Instructions Will Be Listed Below (If Applicable).     If you need a refill on your cardiac medications before your next appointment, please call your pharmacy.

## 2017-02-20 ENCOUNTER — Telehealth: Payer: Self-pay | Admitting: Interventional Cardiology

## 2017-02-20 NOTE — Telephone Encounter (Signed)
New Message   Per pt last visit Dr. Irish Lack suggested he get a power lift chair to help keep his feet elevated and swelling down. Pt wants to know if Dr. Irish Lack can write a prescription for a power lift chair. Requesting call back

## 2017-02-22 NOTE — Telephone Encounter (Signed)
I just suggested he keep his feet elevated with pillows when resting.  I have not suggested a power lift chair.

## 2017-02-23 ENCOUNTER — Encounter: Payer: Self-pay | Admitting: Vascular Surgery

## 2017-02-23 ENCOUNTER — Other Ambulatory Visit: Payer: Medicare Other | Admitting: *Deleted

## 2017-02-23 ENCOUNTER — Ambulatory Visit (INDEPENDENT_AMBULATORY_CARE_PROVIDER_SITE_OTHER): Payer: Medicare Other | Admitting: Vascular Surgery

## 2017-02-23 VITALS — BP 108/66 | HR 60 | Temp 97.0°F | Resp 18 | Ht 77.0 in | Wt 197.0 lb

## 2017-02-23 DIAGNOSIS — Z79899 Other long term (current) drug therapy: Secondary | ICD-10-CM

## 2017-02-23 DIAGNOSIS — I83891 Varicose veins of right lower extremities with other complications: Secondary | ICD-10-CM

## 2017-02-23 LAB — BASIC METABOLIC PANEL
BUN/Creatinine Ratio: 22 (ref 10–24)
BUN: 25 mg/dL (ref 8–27)
CALCIUM: 9.1 mg/dL (ref 8.6–10.2)
CO2: 28 mmol/L (ref 18–29)
CREATININE: 1.14 mg/dL (ref 0.76–1.27)
Chloride: 105 mmol/L (ref 96–106)
GFR calc Af Amer: 68 mL/min/{1.73_m2} (ref >59)
GFR, EST NON AFRICAN AMERICAN: 59 mL/min/{1.73_m2} — AB (ref >59)
Glucose: 73 mg/dL (ref 65–99)
POTASSIUM: 5 mmol/L (ref 3.5–5.2)
Sodium: 143 mmol/L (ref 134–144)

## 2017-02-23 NOTE — Telephone Encounter (Signed)
As we discussed last week, he has a leaky tricuspid valve.  THe only treatment will be more diuretics, compression stockings, leg elevation or an operation to fix the leaky valve.  He is likely not a candidate for this procedure. We will continue conservative measures.

## 2017-02-23 NOTE — Telephone Encounter (Signed)
Patient stated he saw the vascular doctor, who told him his swelling is from his heart. Patient's daughter made an appointment for patient to see [name] [name] this Wednesday. Patient stated that his vascular doctor told him to follow-up with [name] [name] soon. Informed patient to keep his feet elevated at rest and make sure he takes his lasix as prescribed. Patient verbalized understanding and verified his appointment on Wednesday.

## 2017-02-23 NOTE — Telephone Encounter (Signed)
New Message    Pt c/o swelling: STAT is pt has developed SOB within 24 hours  1. How long have you been experiencing swelling? Couple of months  2. Where is the swelling located? Lower legs, and swelling in hands started this week  3.  Are you currently taking a "fluid pill"? Yes  4.  Are you currently SOB? No  5.  Have you traveled recently? No  Appt scheduled with Dr. Irish Lack 4/11

## 2017-02-23 NOTE — Telephone Encounter (Signed)
Left message to call back  

## 2017-02-23 NOTE — Progress Notes (Signed)
Subjective:     Patient ID: [name] [name], male   DOB: 09/26/1931, 83 y.o.   MRN: [id number]  HPI this 83 year old male was referred for evaluation of pain and swelling in the right leg More than left leg. Patient has a history of laser ablation of the right great saphenous vein done by me in January 2018 . He had gross reflux in the right great saphenous vein. Initially he stated that the pain had diminished and the swelling had improved. He does have a history of previous valve replacement surgery by Dr. Vernon Prey and is followed by Dr.Varanasi who has managed his diuretics. He has noted edema not only in his legs but also in his hands. He denies any chest pain. He has difficulty getting his elastic compression stockings on in the morning. He does not elevate foot of his bed.  Past Medical History:  Diagnosis Date  . Abnormality of gait 05/27/2016  . Adenomatous polyps   . Carpal tunnel syndrome 06/25/2016   Right  . Depressive disorder, not elsewhere classified   . First degree AV block    Holter 3/18: NSR, PACs, PVCs, no AFib, no pauses.  Marland Kitchen Hereditary and idiopathic peripheral neuropathy 06/25/2016  . Hypertension   . Internal nasal lesion 05/15/2013  . Melanoma ([place])    Left Shoulder  . Mitral regurgitation   . MVP (mitral valve prolapse)    a. With severe MR s/p Complex valvuloplasty including artificial Gore-tex neochord placement x4, chordal transposition x1, chordal release x1, # 32 mm Sorin Memo 3D Ring Annuloplasty 2012. // b. Echo 2/18: mild LVH, EF 50-55, mild AI, MV repair with mild MR, mod LAE, mod RVE, severe RAE, severe TR  . Neuropathy ([place])   . Normal coronary arteries    a. Normal coronary anatomy by cath 2012.  . Osteoarthritis    Knees  . PAF (paroxysmal atrial fibrillation) ([place])    a. Post-op MVR 2012.  Marland Kitchen Personal history of colonic polyps   . Prostate cancer ([place])   . Pulmonary HTN    a. Mild-mod by cath 2012.  . Pure hypercholesterolemia   . PVC (premature  ventricular contraction)   . Thrombocytopenia ([place])   . Vision abnormalities    Cornea scarring    Social History  Substance Use Topics  . Smoking status: Never Smoker  . Smokeless tobacco: Never Used  . Alcohol use No     Comment: Last drink in 2000    Family History  Problem Relation Age of Onset  . Clotting disorder Brother     CVA's  . Arthritis Mother   . Hypertension Mother   . Stroke Mother   . Hypertension Father   . Psychosis Father     psychiatric care  . Colon cancer Neg Hx   . Stomach cancer Neg Hx   . Heart attack Neg Hx     No Known Allergies   Current Outpatient Prescriptions:  .  amiodarone (PACERONE) 200 MG tablet, Take 200 mg by mouth daily. , Disp: , Rfl:  .  CARTIA XT 300 MG 24 hr capsule, Take 300 mg by mouth daily. , Disp: , Rfl:  .  furosemide (LASIX) 40 MG tablet, Take 1 tablet (40 mg total) by mouth daily., Disp: 90 tablet, Rfl: 3 .  gabapentin (NEURONTIN) 300 MG capsule, Take 300 mg by mouth 2 (two) times daily. , Disp: , Rfl:  .  MYRBETRIQ 50 MG TB24 tablet, Take 1 tablet by mouth daily., Disp: ,  Rfl:  .  potassium chloride SA (K-DUR,KLOR-CON) 20 MEQ tablet, Take 1 tablet (20 mEq total) by mouth daily., Disp: 90 tablet, Rfl: 2 .  rosuvastatin (CRESTOR) 5 MG tablet, Take 5 mg by mouth daily at 6 PM. , Disp: , Rfl:  .  TOPROL XL 50 MG 24 hr tablet, Take 50 mg by mouth daily. , Disp: , Rfl:  .  traZODone (DESYREL) 100 MG tablet, Take 300 mg by mouth at bedtime. , Disp: , Rfl:  .  vitamin C (ASCORBIC ACID) 500 MG tablet, Take 500 mg by mouth 2 (two) times daily., Disp: , Rfl:  .  warfarin (COUMADIN) 5 MG tablet, TAKE 1 TABLET DAILY AS DIRECTED BY THE COUMADIN CLINIC, Disp: 30 tablet, Rfl: 3  Vitals:   02/23/17 1331  BP: 108/66  Pulse: 60  Resp: 18  Temp: 97 F (36.1 C)  SpO2: 94%  Weight: 197 lb (89.4 kg)  Height: 6\' 5"  (1.956 m)    Body mass index is 23.36 kg/m.          Review of Systems Denies chest pain, hemoptysis,  orthopnea. History of mitral valve replacement in 2012. Has chronic A. fib on anticoagulation.    Objective:   Physical Exam BP 108/66 (BP Location: Left Arm, Patient Position: Sitting, Cuff Size: Large)   Pulse 60   Temp 97 F (36.1 C)   Resp 18   Ht 6\' 5"  (1.956 m)   Wt 197 lb (89.4 kg)   SpO2 94%   BMI 23.36 kg/m   Normal male no apparent distress alert and oriented 3 Lungs no rhonchi or wheezing Both lower extremities with diffuse edema 2+ on the right 1+ on the left. No active ulceration noted. 3+ dorsalis pedis pulses palpable. No bulging varicosities noted.  Patient had a venous reflux study of the right leg performed on 02/17/2017 in our office which I reviewed. There is some deep vein incompetence in the popliteal vein but no obstruction or other reflux. The great saphenous vein on the right is totally closed consistent with previous ablation. There is no DVT.     Assessment:     #1 status post right great saphenous vein ablation performed in January 2018-vein successfully closed and has remained closed #2 bilateral peripheral edema and upper extremity edema likely due to fluid overload-possible cardiac source    Plan:     #1 elevate foot of bed 2-3 inches #2 long-leg elastic compression stockings to be placed on the lower extremities before getting out of bed in the morning by his caregiver #3 appointment for further follow-up with Dr.Varanasi recommendations regarding further diuretic therapy or medical management

## 2017-02-24 ENCOUNTER — Telehealth: Payer: Self-pay | Admitting: Interventional Cardiology

## 2017-02-24 ENCOUNTER — Telehealth: Payer: Self-pay | Admitting: Family Medicine

## 2017-02-24 NOTE — Telephone Encounter (Signed)
A message was left yesterday for the patient to call back regarding his swelling. Patient was made aware of Dr. Hassell Done recommendations (see phone note from 02/20/17). Patient verbalized understanding. Patient made aware that his BMET from yesterday has not be reviewed by MD yet and we will let him know when we have the results. Patient verbalized understanding.

## 2017-02-24 NOTE — Telephone Encounter (Signed)
Patient made aware of results.  Patient states that his swelling is not better. Lasix increased to 40 mg BID and instructed for patient to take first dose in the AM and second dose around 2PM. Patient verbalizes understanding.  Notes recorded by [name], MD on 02/24/2017 at 11:06 AM EDT Electrolytes controlled. If swelling is not better, can go to Lasix 40 mg BID. First dose in AM, second dose around 2PM.

## 2017-02-24 NOTE — Telephone Encounter (Signed)
New message    Returning call from yesterday for results

## 2017-02-24 NOTE — Telephone Encounter (Signed)
Called patient and made him aware of Dr. Hassell Done recommendations. Patient verbalizes understanding. Patient states that he will discuss this further with Dr. Irish Lack at his appointment tomorrow.

## 2017-02-24 NOTE — Telephone Encounter (Signed)
Called pt. Rang busy. Pt due for AWV

## 2017-02-25 ENCOUNTER — Ambulatory Visit (INDEPENDENT_AMBULATORY_CARE_PROVIDER_SITE_OTHER): Payer: Medicare Other | Admitting: Interventional Cardiology

## 2017-02-25 ENCOUNTER — Encounter: Payer: Self-pay | Admitting: Interventional Cardiology

## 2017-02-25 VITALS — BP 120/80 | HR 61 | Ht 77.0 in | Wt 202.0 lb

## 2017-02-25 DIAGNOSIS — R600 Localized edema: Secondary | ICD-10-CM | POA: Diagnosis not present

## 2017-02-25 DIAGNOSIS — Z9889 Other specified postprocedural states: Secondary | ICD-10-CM

## 2017-02-25 DIAGNOSIS — I4819 Other persistent atrial fibrillation: Secondary | ICD-10-CM

## 2017-02-25 DIAGNOSIS — Z7901 Long term (current) use of anticoagulants: Secondary | ICD-10-CM | POA: Insufficient documentation

## 2017-02-25 DIAGNOSIS — I481 Persistent atrial fibrillation: Secondary | ICD-10-CM

## 2017-02-25 MED ORDER — FUROSEMIDE 40 MG PO TABS: 40.0000 mg | ORAL_TABLET | Freq: Two times a day (BID) | ORAL | 3 refills | Status: DC

## 2017-02-25 NOTE — Progress Notes (Signed)
Cardiology Office Note   Date:  02/25/2017   ID:  BARON PARMELEE, DOB 16-May-1931, MRN [id number]  PCP:  Penni Homans, MD    No chief complaint on file. f/u AFib   Wt Readings from Last 3 Encounters:  02/25/17 202 lb (91.6 kg)  02/23/17 197 lb (89.4 kg)  02/18/17 202 lb (91.6 kg)       History of Present Illness: NIK GORRELL is a 83 y.o. male  followed in the past by Dr. Mare Ferrari. His past cardiac history is notable for PAF/Flutter. He is on chronic anticoagulation with Coumadin. He also has a h/o severe mitral valve prolapse. He underwent mitral valve repair by Dr. Roxy Manns on 10/01/11. He did not have any coronary disease. In May 2015 the patient had an episode of syncope at home secondary to dehydration and a urinary tract infection. He had atrial fibrillation recurrence felt to be secondary to the stress of the UTI. He subsequently went back in normal sinus rhythm and was continued on warfarin.   The patient was seen on 12/05/15 and at that time he was noted to be back in atrial flutter with variable ventricular response. 2D echo was obtained showingan EF of 45-50% with G2DD. There was grade 2 diastolic dysfunction. Mitral valve shows satisfactory function with mild stenosis and mild regurgitation.The left atrium was mildly dilated. The right atrium was severely dilated. He was started on amiodarone. His warfarin was continued.He did not convert on amiodarone. Therefore outpatient cardioversion was arranged.On 01/02/16, the patient underwent synchronized cardioversion using 50 J by Dr. Acie Fredrickson with successful cardioversion to normal sinus rhythm from atrial flutter.  Also of note, he had a recent CXR in 12/2015 which showed decreased aeration at the base and a follow-up chest 2 x-ray was recommended to assure clearing. Dr. Mare Ferrari arranged for this to be done. F/u CXR was done 02/07/16. Radiologist reports is as follows:  IMPRESSION: 1. Stable opacity at the left lung  base overlying the left heart border, with additional stable blunting of the adjacent left costophrenic angle. As described on the earlier chest x-ray report, this could represent infiltrate and adjacent pleural effusion, but underlying mass not excluded. Another consideration would be chronic pleural thickening/scarring. Given the persistence since chest x-ray of 12/27/2015, would consider chest CT at this point to exclude the possibility of neoplastic mass. 2. Hyperexpanded lungs suggesting COPD.    He has been fully compliant with Coumadin. No significant abnormal bleeding, other than bruising. His INR is followed in our Coumadin Clinic.   Had a negative chest CT scan for malignancy.  Still has atelectasis on his most recent CXray.  No SHOB.    He denies any symptoms of breakthrough atrial fibrillation since his cardioversion or on Amio 200 mg daily. No palpitations, dyspnea or chest pain. He also denies wheezing and productive cough. He denies any prior h/o tobacco abuse. No previous occupational exposures.  He had worsening leg swelling in January.  He had laser surgery of the right leg.  THe left leg has increased in swelling and the skin is seeping some fluid.  Echo showed severe TR and dilated RV.  He has not taken his Lasix daily as previously instructed.  It is inconvenient for him.  He spends about 5 days /week at home without going out.      Past Medical History:  Diagnosis Date  . Abnormality of gait 05/27/2016  . Adenomatous polyps   . Carpal tunnel syndrome 06/25/2016  Right  . Depressive disorder, not elsewhere classified   . First degree AV block    Holter 3/18: NSR, PACs, PVCs, no AFib, no pauses.  Marland Kitchen Hereditary and idiopathic peripheral neuropathy 06/25/2016  . Hypertension   . Internal nasal lesion 05/15/2013  . Melanoma ([place])    Left Shoulder  . Mitral regurgitation   . MVP (mitral valve prolapse)    a. With severe MR s/p Complex valvuloplasty including  artificial Gore-tex neochord placement x4, chordal transposition x1, chordal release x1, # 32 mm Sorin Memo 3D Ring Annuloplasty 2012. // b. Echo 2/18: mild LVH, EF 50-55, mild AI, MV repair with mild MR, mod LAE, mod RVE, severe RAE, severe TR  . Neuropathy ([place])   . Normal coronary arteries    a. Normal coronary anatomy by cath 2012.  . Osteoarthritis    Knees  . PAF (paroxysmal atrial fibrillation) ([place])    a. Post-op MVR 2012.  Marland Kitchen Personal history of colonic polyps   . Prostate cancer ([place])   . Pulmonary HTN    a. Mild-mod by cath 2012.  . Pure hypercholesterolemia   . PVC (premature ventricular contraction)   . Thrombocytopenia ([place])   . Vision abnormalities    Cornea scarring    Past Surgical History:  Procedure Laterality Date  . CARDIAC CATHETERIZATION  09/2011   Pre-op for MVR -- normal coronaries.  Marland Kitchen CARDIOVERSION N/A 01/02/2016   Procedure: CARDIOVERSION;  Surgeon: Thayer Headings, MD;  Location: [facility] ENDOSCOPY;  Service: Cardiovascular;  Laterality: N/A;  . COLONOSCOPY W/ POLYPECTOMY    . INGUINAL HERNIA REPAIR  09/2009   Left  . KNEE ARTHROSCOPY      left x3  and right x2  . Melanoma Surgery     2001, 2005, 2006, 2009  . MITRAL VALVE REPAIR  10/01/2011   complex valvuloplasty with Goretex cord replacement and chordal transposition 61mm Sorin Memo 3D ring annuloplasty  . Nuclear Stress Test  09/2006   EF-64%, Normal  . PROSTATECTOMY  1993  . ROOT CANAL  08-19-12  . ROTATOR CUFF REPAIR  2003   left  . TEE WITHOUT CARDIOVERSION  09/26/2011   Procedure: TRANSESOPHAGEAL ECHOCARDIOGRAM (TEE);  Surgeon: Lelon Perla, MD;  Location: [facility] ENDOSCOPY;  Service: Cardiovascular;  Laterality: N/A;  . US ECHOCARDIOGRAPHY  09/2009, 08/1011   mild LVH,mild AI,MVP with mild MR, mild-mod. TR with mild Pulm. HTN, EF-55-60%     Current Outpatient Prescriptions  Medication Sig Dispense Refill  . amiodarone (PACERONE) 200 MG tablet Take 200 mg by mouth daily.     Marland Kitchen CARTIA XT 300  MG 24 hr capsule Take 300 mg by mouth daily.     Marland Kitchen gabapentin (NEURONTIN) 300 MG capsule Take 300 mg by mouth 2 (two) times daily.     Marland Kitchen MYRBETRIQ 50 MG TB24 tablet Take 1 tablet by mouth daily.    . potassium chloride SA (K-DUR,KLOR-CON) 20 MEQ tablet Take 1 tablet (20 mEq total) by mouth daily. 90 tablet 2  . rosuvastatin (CRESTOR) 5 MG tablet Take 5 mg by mouth daily at 6 PM.     . TOPROL XL 50 MG 24 hr tablet Take 50 mg by mouth daily.     . traZODone (DESYREL) 100 MG tablet Take 300 mg by mouth at bedtime.     . vitamin C (ASCORBIC ACID) 500 MG tablet Take 500 mg by mouth 2 (two) times daily.    Marland Kitchen warfarin (COUMADIN) 5 MG tablet TAKE 1 TABLET DAILY  AS DIRECTED BY THE COUMADIN CLINIC 30 tablet 3  . furosemide (LASIX) 40 MG tablet Take 1 tablet (40 mg total) by mouth 2 (two) times daily. 180 tablet 3   No current facility-administered medications for this visit.     Allergies:   Patient has no known allergies.    Social History:  The patient  reports that he has never smoked. He has never used smokeless tobacco. He reports that he does not drink alcohol or use drugs.   Family History:  The patient's family history includes Arthritis in his mother; Clotting disorder in his brother; Hypertension in his father and mother; Psychosis in his father; Stroke in his mother.    ROS:  Please see the history of present illness.   Otherwise, review of systems are positive for swelling in his legs when he misses his Lasix.   All other systems are reviewed and negative.    PHYSICAL EXAM: VS:  BP 120/80 (BP Location: Left Arm, Patient Position: Sitting, Cuff Size: Normal)   Pulse 61   Ht 6\' 5"  (1.956 m)   Wt 202 lb (91.6 kg)   SpO2 91%   BMI 23.95 kg/m  , BMI Body mass index is 23.95 kg/m. GEN: Well nourished, well developed, in no acute distress  HEENT: normal  Neck: elevated JVD, no carotid bruits, or masses Cardiac: RRR; no murmurs, rubs, or gallops, 3+ bilateral pitting LE edema    Respiratory:  clear to auscultation bilaterally, normal work of breathing GI: soft, nontender, nondistended, + BS MS: no deformity or atrophy  Skin: warm and dry, no rash Neuro:  Strength and sensation are intact Psych: euthymic mood, full affect   EKG:   The ekg ordered today demonstrates NSR, no ST segment changes   Recent Labs: 08/25/2016: ALT 22; TSH 0.73 02/23/2017: BUN 25; Creatinine, Ser 1.14; Potassium 5.0; Sodium 143   Lipid Panel    Component Value Date/Time   CHOL 112 (L) 11/02/2015 1048   TRIG 66 11/02/2015 1048   HDL 49 11/02/2015 1048   CHOLHDL 2.3 11/02/2015 1048   VLDL 13 11/02/2015 1048   LDLCALC 50 11/02/2015 1048     Other studies Reviewed: Additional studies/ records that were reviewed today with results demonstrating: echo in 2/18 as resulted above.   ASSESSMENT AND PLAN:  1. PAF: Maintainting NSR on amiodarone 200 mg daily.  Coumadin for stroke prevention. 2. Systolic and diastolic heart failure: chronic:  Worsening leg swelling is a sx of right heart failure. Prominent neck veins as well. Related to TR and RV dysfunction.  He was advised to increase Lasix dose to 40 mg BID om the days he is at home.  Will take one taba day on the days he goes out.  POtassium has been high. COntinue current potassium dose.  No signs of left sided heart failure.  BMet in a few weeks at next visit.  I reviewed his echo reports since 2015.  His TR has progressed from mild to severe and this has affected RV function.   3. Anticoagulation: Warfarin for stroke prevention. 4. Try to get more exercise to help with venous return.  Elevate legs at night.  Compression stockings when his leg sizes allow.  He is not going to be a good candidate for aggressive invasive therapy such as tricuspid valve repair given his age and other comorbidities.    Right heart cath would be needed to assess PA pressures as there was no mention of them on either of  the prior echo results.  For now, try to  use conservative measures: Diuretics, leg elevation, compression.  Discussed at length with the patient and daughter.  THey are in agreement.   Current medicines are reviewed at length with the patient today.  The patient concerns regarding his medicines were addressed.  The following changes have been made:  Increase Lasix  Labs/ tests ordered today include:   No orders of the defined types were placed in this encounter.   Recommend 150 minutes/week of aerobic exercise if this can be done safely Low fat, low carb, high fiber diet recommended  Disposition:   FU in 3-4 weeks per the patient's request   Signed, Larae Grooms, MD  02/25/2017 10:31 AM    [facility] [place], [place], [place]  [address] Phone: [phone]; Fax: [phone]

## 2017-02-25 NOTE — Patient Instructions (Signed)
Medication Instructions:  INCREASE furosemide to 40 mg twice a day.  Labwork: None ordered  Testing/Procedures: None ordered  Follow-Up: Your physician recommends that you schedule a follow-up appointment in: 2 weeks with Dr. Irish Lack.   Any Other Special Instructions Will Be Listed Below (If Applicable).     If you need a refill on your cardiac medications before your next appointment, please call your pharmacy.

## 2017-02-27 ENCOUNTER — Telehealth: Payer: Self-pay | Admitting: Interventional Cardiology

## 2017-02-27 NOTE — Telephone Encounter (Signed)
New message    Pt states he wants an RX for Electric lift chair to get his feet elevated per Dr. Irish Lack. He requests a call back

## 2017-02-27 NOTE — Telephone Encounter (Signed)
Patient called an informed that Dr. Irish Lack only suggest that he keep his feet elevated and that he could use pillows. Patient made aware that Dr. Irish Lack will not be writing a prescription for a power lift chair. Patient advised to keep his legs elevated with pillows, wear compression stockings, and to continue diuretics. Patient verbalized understanding.

## 2017-03-02 ENCOUNTER — Ambulatory Visit (INDEPENDENT_AMBULATORY_CARE_PROVIDER_SITE_OTHER): Payer: Medicare Other | Admitting: *Deleted

## 2017-03-02 DIAGNOSIS — Z5181 Encounter for therapeutic drug level monitoring: Secondary | ICD-10-CM

## 2017-03-02 DIAGNOSIS — I481 Persistent atrial fibrillation: Secondary | ICD-10-CM

## 2017-03-02 DIAGNOSIS — I4819 Other persistent atrial fibrillation: Secondary | ICD-10-CM

## 2017-03-02 DIAGNOSIS — I4891 Unspecified atrial fibrillation: Secondary | ICD-10-CM | POA: Diagnosis not present

## 2017-03-02 LAB — POCT INR: INR: 2.9

## 2017-03-10 NOTE — Progress Notes (Signed)
Cardiology Office Note   Date:  03/11/2017   ID:  [name] [name], DOB 27-Jun-1931, MRN [id number]  PCP:  Penni Homans, MD    No chief complaint on file. f/u AFib   Wt Readings from Last 3 Encounters:  03/11/17 204 lb 12.8 oz (92.9 kg)  02/25/17 202 lb (91.6 kg)  02/23/17 197 lb (89.4 kg)       History of Present Illness: [name] [name] is a 83 y.o. male  followed in the past by Dr. Mare Ferrari. His past cardiac history is notable for PAF/Flutter. He is on chronic anticoagulation with Coumadin. He also has a h/o severe mitral valve prolapse. He underwent mitral valve repair by Dr. Roxy Manns on 10/01/11. He did not have any coronary disease. In May 2015 the patient had an episode of syncope at home secondary to dehydration and a urinary tract infection. He had atrial fibrillation recurrence felt to be secondary to the stress of the UTI. He subsequently went back in normal sinus rhythm and was continued on warfarin.   The patient was seen on 12/05/15 and at that time he was noted to be back in atrial flutter with variable ventricular response. 2D echo was obtained showingan EF of 45-50% with G2DD. There was grade 2 diastolic dysfunction. Mitral valve shows satisfactory function with mild stenosis and mild regurgitation.The left atrium was mildly dilated. The right atrium was severely dilated. He was started on amiodarone. His warfarin was continued.He did not convert on amiodarone. Therefore outpatient cardioversion was arranged.On 01/02/16, the patient underwent synchronized cardioversion using 50 J by Dr. Acie Fredrickson with successful cardioversion to normal sinus rhythm from atrial flutter.  Also of note, he had a recent CXR in 12/2015 which showed decreased aeration at the base and a follow-up chest 2 x-ray was recommended to assure clearing. Dr. Mare Ferrari arranged for this to be done. F/u CXR was done 02/07/16. Radiologist reports is as follows:  IMPRESSION: 1. Stable opacity at the left  lung base overlying the left heart border, with additional stable blunting of the adjacent left costophrenic angle. As described on the earlier chest x-ray report, this could represent infiltrate and adjacent pleural effusion, but underlying mass not excluded. Another consideration would be chronic pleural thickening/scarring. Given the persistence since chest x-ray of 12/27/2015, would consider chest CT at this point to exclude the possibility of neoplastic mass. 2. Hyperexpanded lungs suggesting COPD.    He has been fully compliant with Coumadin. No significant abnormal bleeding, other than bruising. His INR is followed in our Coumadin Clinic.   Had a negative chest CT scan for malignancy.  Still has atelectasis on his most recent CXray.  No SHOB.    No symptoms of breakthrough atrial fibrillation since his cardioversion or on Amio 200 mg daily. No palpitations, dyspnea or chest pain. He also denies wheezing and productive cough. He denies any prior h/o tobacco abuse.   He had worsening leg swelling in January 2018.  He had laser surgery of the right leg veins.  THe left leg has increased in swelling and the skin is seeping some fluid.  He was managed at [facility].  Echo showed severe TR and dilated RV.  This was thought to be the cause of his swelling. Lasix was prescribed, but not taken daily due to it being inconvenient for him.  He spends about 5 days /week at home without going out.  In early April 2018, we increased the dose of the Lasix and he was agreeable to  taking it.  I spoke to him and his daughter.  They were hoping to avoid any invasive testing and hoping this could be managed conservatively.  Since the last visit a few weeks ago, he reports that his swelling is better but that he has forgotten his second dose if Lasix at times.      Past Medical History:  Diagnosis Date  . Abnormality of gait 05/27/2016  . Adenomatous polyps   . Carpal tunnel syndrome 06/25/2016   Right  .  Depressive disorder, not elsewhere classified   . First degree AV block    Holter 3/18: NSR, PACs, PVCs, no AFib, no pauses.  Marland Kitchen Hereditary and idiopathic peripheral neuropathy 06/25/2016  . Hypertension   . Internal nasal lesion 05/15/2013  . Melanoma ([place])    Left Shoulder  . Mitral regurgitation   . MVP (mitral valve prolapse)    a. With severe MR s/p Complex valvuloplasty including artificial Gore-tex neochord placement x4, chordal transposition x1, chordal release x1, # 32 mm Sorin Memo 3D Ring Annuloplasty 2012. // b. Echo 2/18: mild LVH, EF 50-55, mild AI, MV repair with mild MR, mod LAE, mod RVE, severe RAE, severe TR  . Neuropathy   . Normal coronary arteries    a. Normal coronary anatomy by cath 2012.  . Osteoarthritis    Knees  . PAF (paroxysmal atrial fibrillation) ([place])    a. Post-op MVR 2012.  Marland Kitchen Personal history of colonic polyps   . Prostate cancer ([place])   . Pulmonary HTN ([place])    a. Mild-mod by cath 2012.  . Pure hypercholesterolemia   . PVC (premature ventricular contraction)   . Thrombocytopenia ([place])   . Vision abnormalities    Cornea scarring    Past Surgical History:  Procedure Laterality Date  . CARDIAC CATHETERIZATION  09/2011   Pre-op for MVR -- normal coronaries.  Marland Kitchen CARDIOVERSION N/A 01/02/2016   Procedure: CARDIOVERSION;  Surgeon: Thayer Headings, MD;  Location: [facility] ENDOSCOPY;  Service: Cardiovascular;  Laterality: N/A;  . COLONOSCOPY W/ POLYPECTOMY    . INGUINAL HERNIA REPAIR  09/2009   Left  . KNEE ARTHROSCOPY      left x3  and right x2  . Melanoma Surgery     2001, 2005, 2006, 2009  . MITRAL VALVE REPAIR  10/01/2011   complex valvuloplasty with Goretex cord replacement and chordal transposition 71mm Sorin Memo 3D ring annuloplasty  . Nuclear Stress Test  09/2006   EF-64%, Normal  . PROSTATECTOMY  1993  . ROOT CANAL  08-19-12  . ROTATOR CUFF REPAIR  2003   left  . TEE WITHOUT CARDIOVERSION  09/26/2011   Procedure: TRANSESOPHAGEAL ECHOCARDIOGRAM  (TEE);  Surgeon: Lelon Perla, MD;  Location: [facility] ENDOSCOPY;  Service: Cardiovascular;  Laterality: N/A;  . US ECHOCARDIOGRAPHY  09/2009, 08/1011   mild LVH,mild AI,MVP with mild MR, mild-mod. TR with mild Pulm. HTN, EF-55-60%     Current Outpatient Prescriptions  Medication Sig Dispense Refill  . amiodarone (PACERONE) 200 MG tablet Take 200 mg by mouth daily.     Marland Kitchen CARTIA XT 300 MG 24 hr capsule Take 300 mg by mouth daily.     . furosemide (LASIX) 40 MG tablet Take 1 tablet (40 mg total) by mouth 2 (two) times daily. 180 tablet 3  . gabapentin (NEURONTIN) 300 MG capsule Take 300 mg by mouth 2 (two) times daily.     Marland Kitchen MYRBETRIQ 50 MG TB24 tablet Take 1 tablet by mouth daily.    Marland Kitchen  potassium chloride SA (K-DUR,KLOR-CON) 20 MEQ tablet Take 1 tablet (20 mEq total) by mouth daily. 90 tablet 2  . rosuvastatin (CRESTOR) 5 MG tablet Take 5 mg by mouth daily at 6 PM.     . TOPROL XL 50 MG 24 hr tablet Take 50 mg by mouth daily.     . traZODone (DESYREL) 100 MG tablet Take 300 mg by mouth at bedtime.     . triamcinolone cream (KENALOG) 0.1 % Apply 1 application topically 2 (two) times daily. For redness and inflammation on leg.    . vitamin C (ASCORBIC ACID) 500 MG tablet Take 500 mg by mouth 2 (two) times daily.    Marland Kitchen warfarin (COUMADIN) 5 MG tablet TAKE 1 TABLET DAILY AS DIRECTED BY THE COUMADIN CLINIC 30 tablet 3   No current facility-administered medications for this visit.     Allergies:   Patient has no known allergies.    Social History:  The patient  reports that he has never smoked. He has never used smokeless tobacco. He reports that he does not drink alcohol or use drugs.   Family History:  The patient's family history includes Arthritis in his mother; Clotting disorder in his brother; Hypertension in his father and mother; Psychosis in his father; Stroke in his mother.    ROS:  Please see the history of present illness.   Otherwise, review of systems are positive for swelling in  his legs when he misses his Lasix.   All other systems are reviewed and negative.    PHYSICAL EXAM: VS:  BP 126/74 (BP Location: Left Arm)   Pulse 60   Ht 6\' 5"  (1.956 m)   Wt 204 lb 12.8 oz (92.9 kg)   BMI 24.29 kg/m  , BMI Body mass index is 24.29 kg/m. GEN: Well nourished, well developed, in no acute distress  HEENT: normal  Neck: elevated JVD, no carotid bruits, or masses Cardiac: RRR; no murmurs, rubs, or gallops, 3+ bilateral pitting LE edema  Respiratory:  clear to auscultation bilaterally, normal work of breathing GI: soft, nontender, nondistended, + BS MS: no deformity or atrophy  Skin: warm and dry, no rash Neuro:  Strength and sensation are intact Psych: euthymic mood, full affect   EKG:   The ekg ordered today demonstrates NSR, no ST segment changes   Recent Labs: 08/25/2016: ALT 22; TSH 0.73 02/23/2017: BUN 25; Creatinine, Ser 1.14; Potassium 5.0; Sodium 143   Lipid Panel    Component Value Date/Time   CHOL 112 (L) 11/02/2015 1048   TRIG 66 11/02/2015 1048   HDL 49 11/02/2015 1048   CHOLHDL 2.3 11/02/2015 1048   VLDL 13 11/02/2015 1048   LDLCALC 50 11/02/2015 1048     Other studies Reviewed: Additional studies/ records that were reviewed today with results demonstrating: echo in 2/18 as resulted above.   ASSESSMENT AND PLAN:  1. PAF: Maintainting NSR on amiodarone 200 mg daily.  Coumadin for stroke prevention. 2. Systolic and diastolic heart failure: chronic:  MV repair from 2012 appears to be functioning well by the last echo.  Worsening leg swelling is a sx of right heart failure. Prominent neck veins as well. Related to TR and RV dysfunction.  He was again advised to increase Lasix dose to 40 mg BID on the days he is at home.  Will take one tablet a day on the days he goes out.  POtassium has been high. COntinue current potassium dose.  No signs of left sided heart failure.  BMet in a few weeks at next visit.  I reviewed his echo reports since 2015.  His  TR has progressed from mild to severe and this has affected RV function.   3. Anticoagulation: Warfarin for stroke prevention. 4.   Elevate legs at night.  Compression stockings when his leg sizes allow.  Will refer to [facility] wound care.  Right heart cath would be needed to assess PA pressures as there was no mention of them on either of the prior echo results.  For now, try to use conservative measures: Diuretics, leg elevation, compression.  Will check with CHF MDs as well if there are any other options.  He is hesitant to have any invasive testing.   Discussed at length with the patient and daughter.  THey are in agreement.   Current medicines are reviewed at length with the patient today.  The patient concerns regarding his medicines were addressed.  The following changes have been made:  Increase Lasix  Labs/ tests ordered today include:   No orders of the defined types were placed in this encounter.   Recommend 150 minutes/week of aerobic exercise if this can be done safely Low fat, low carb, high fiber diet recommended  Disposition:   FU in in 2-3 weeks   Signed, Larae Grooms, MD  03/11/2017 11:49 AM    [facility] [place], [place], [place]  [address] Phone: [phone]; Fax: [phone]

## 2017-03-11 ENCOUNTER — Encounter: Payer: Self-pay | Admitting: Interventional Cardiology

## 2017-03-11 ENCOUNTER — Ambulatory Visit (INDEPENDENT_AMBULATORY_CARE_PROVIDER_SITE_OTHER): Payer: Medicare Other | Admitting: Interventional Cardiology

## 2017-03-11 VITALS — BP 126/74 | HR 60 | Ht 77.0 in | Wt 204.8 lb

## 2017-03-11 DIAGNOSIS — Z7901 Long term (current) use of anticoagulants: Secondary | ICD-10-CM | POA: Diagnosis not present

## 2017-03-11 DIAGNOSIS — I83009 Varicose veins of unspecified lower extremity with ulcer of unspecified site: Secondary | ICD-10-CM

## 2017-03-11 DIAGNOSIS — I5042 Chronic combined systolic (congestive) and diastolic (congestive) heart failure: Secondary | ICD-10-CM

## 2017-03-11 DIAGNOSIS — M7989 Other specified soft tissue disorders: Secondary | ICD-10-CM

## 2017-03-11 DIAGNOSIS — R600 Localized edema: Secondary | ICD-10-CM

## 2017-03-11 DIAGNOSIS — I480 Paroxysmal atrial fibrillation: Secondary | ICD-10-CM

## 2017-03-11 DIAGNOSIS — I071 Rheumatic tricuspid insufficiency: Secondary | ICD-10-CM

## 2017-03-11 DIAGNOSIS — Z9889 Other specified postprocedural states: Secondary | ICD-10-CM

## 2017-03-11 DIAGNOSIS — L97909 Non-pressure chronic ulcer of unspecified part of unspecified lower leg with unspecified severity: Secondary | ICD-10-CM

## 2017-03-11 NOTE — Patient Instructions (Signed)
Medication Instructions:  Your physician recommends that you continue on your current medications as directed. Please refer to the Current Medication list given to you today.  CONTINUE to take furosemide lasix 40 mg TWICE A DAY  Labwork: None ordered for today. Will Check BMET at next Office Visit  Testing/Procedures: None ordered  Follow-Up: You have been referred to [facility] for venous stasis ulcer.  Keep Appointment with Dr. Irish Lack on 03/20/17.    Any Other Special Instructions Will Be Listed Below (If Applicable).     If you need a refill on your cardiac medications before your next appointment, please call your pharmacy.

## 2017-03-12 DIAGNOSIS — I071 Rheumatic tricuspid insufficiency: Secondary | ICD-10-CM | POA: Insufficient documentation

## 2017-03-16 ENCOUNTER — Telehealth: Payer: Self-pay | Admitting: Interventional Cardiology

## 2017-03-16 NOTE — Telephone Encounter (Signed)
[name] [name] is calling to see if he can into the wound center at [facility]. Please call

## 2017-03-16 NOTE — Telephone Encounter (Signed)
Called patient and spoke with daughter, Anna Genre. She said patient has a wound on his leg and that he has an appointment at the wound clinic at [facility] on 03/24/2017 at 1:30 pm. She was wondering if we could move the appointment up sooner. I told her she would have to call the wound clinic to reschedule it to get the appt moved sooner. She cancelled the appointment with Dr. Irish Lack on 03/20/2017 and wanted to reschedule another appt with him after the wound appointment. Made Pt appointment with Dr. Irish Lack on 04/03/2017 at 1:40 pm. Manuela Schwartz thanked me for my call.

## 2017-03-17 ENCOUNTER — Encounter: Payer: Self-pay | Admitting: Interventional Cardiology

## 2017-03-19 ENCOUNTER — Other Ambulatory Visit (INDEPENDENT_AMBULATORY_CARE_PROVIDER_SITE_OTHER): Payer: Self-pay

## 2017-03-19 ENCOUNTER — Telehealth (INDEPENDENT_AMBULATORY_CARE_PROVIDER_SITE_OTHER): Payer: Self-pay | Admitting: Orthopaedic Surgery

## 2017-03-19 MED ORDER — WHEELCHAIR MISC: 1.0000 mL | Freq: Once | 0 refills | Status: AC

## 2017-03-19 NOTE — Telephone Encounter (Signed)
Please advise 

## 2017-03-19 NOTE — Telephone Encounter (Signed)
Patient's daughter Manuela Schwartz called and is requesting an rx for a wheelchair for the patient. She says he is not as independent anymore and could really use the wheelchair to help him get around. Please call the daughter back at [phone].

## 2017-03-19 NOTE — Telephone Encounter (Signed)
Ok to order 

## 2017-03-20 ENCOUNTER — Ambulatory Visit: Payer: Medicare Other | Admitting: Interventional Cardiology

## 2017-03-20 ENCOUNTER — Telehealth: Payer: Self-pay

## 2017-03-20 NOTE — Telephone Encounter (Signed)
Attempted to call the patient and notify him that Dr. Irish Lack would like to refer him to the CHF clinic to see Dr. Haroldine Laws.   Patient did not answer. Left message for patient to call back.

## 2017-03-23 ENCOUNTER — Other Ambulatory Visit (INDEPENDENT_AMBULATORY_CARE_PROVIDER_SITE_OTHER): Payer: Self-pay

## 2017-03-23 DIAGNOSIS — R262 Difficulty in walking, not elsewhere classified: Secondary | ICD-10-CM

## 2017-03-23 NOTE — Telephone Encounter (Signed)
Patient called about wheelchair rx. He states it was sent to [facility] and they do not handle the wheelchair orders. He is requesting this be sent somewhere else (wherever he can get the wheelchair). He is requesting a phone call as to where the rx will be sent please.

## 2017-03-23 NOTE — Telephone Encounter (Signed)
LVMOM for Daughter Anna Genre ans let her know I left a RX for wheelchair. She can go to [facility] as I told her.

## 2017-03-24 ENCOUNTER — Encounter (HOSPITAL_BASED_OUTPATIENT_CLINIC_OR_DEPARTMENT_OTHER): Payer: Medicare Other | Attending: Surgery

## 2017-03-24 ENCOUNTER — Telehealth: Payer: Self-pay | Admitting: Interventional Cardiology

## 2017-03-24 DIAGNOSIS — I890 Lymphedema, not elsewhere classified: Secondary | ICD-10-CM | POA: Diagnosis not present

## 2017-03-24 DIAGNOSIS — Z79899 Other long term (current) drug therapy: Secondary | ICD-10-CM | POA: Insufficient documentation

## 2017-03-24 DIAGNOSIS — L84 Corns and callosities: Secondary | ICD-10-CM | POA: Insufficient documentation

## 2017-03-24 DIAGNOSIS — L97222 Non-pressure chronic ulcer of left calf with fat layer exposed: Secondary | ICD-10-CM | POA: Insufficient documentation

## 2017-03-24 DIAGNOSIS — I872 Venous insufficiency (chronic) (peripheral): Secondary | ICD-10-CM | POA: Diagnosis not present

## 2017-03-24 DIAGNOSIS — Z7901 Long term (current) use of anticoagulants: Secondary | ICD-10-CM | POA: Diagnosis not present

## 2017-03-24 DIAGNOSIS — L97212 Non-pressure chronic ulcer of right calf with fat layer exposed: Secondary | ICD-10-CM | POA: Insufficient documentation

## 2017-03-24 DIAGNOSIS — I5041 Acute combined systolic (congestive) and diastolic (congestive) heart failure: Secondary | ICD-10-CM | POA: Diagnosis not present

## 2017-03-24 DIAGNOSIS — Z9114 Patient's other noncompliance with medication regimen: Secondary | ICD-10-CM | POA: Diagnosis not present

## 2017-03-24 DIAGNOSIS — I480 Paroxysmal atrial fibrillation: Secondary | ICD-10-CM | POA: Insufficient documentation

## 2017-03-24 NOTE — Telephone Encounter (Signed)
Left message for patient to call back  

## 2017-03-24 NOTE — Telephone Encounter (Signed)
Patient returning my call. Patient made aware that Dr. Irish Lack would like to refer him to the CHF clinic to see Dr. Haroldine Laws. Patient is in agreement. Message sent to [facility] Dr. Bensimhon's nurse to schedule patient. Patient aware that he will receive a phone call to schedule an appointment with Dr. Haroldine Laws.

## 2017-03-24 NOTE — Telephone Encounter (Signed)
See phone note from 03/20/17

## 2017-03-24 NOTE — Telephone Encounter (Signed)
Follow Up:; ° ° °Returning your call. °

## 2017-03-26 ENCOUNTER — Telehealth (INDEPENDENT_AMBULATORY_CARE_PROVIDER_SITE_OTHER): Payer: Self-pay | Admitting: Orthopaedic Surgery

## 2017-03-26 NOTE — Telephone Encounter (Signed)
Patient request Rx for wheelchair to be mailed to his home address. Please call with any questions. Patient also wants to know if wheelchair was authrizated. Please call patient.

## 2017-03-27 DIAGNOSIS — L97212 Non-pressure chronic ulcer of right calf with fat layer exposed: Secondary | ICD-10-CM | POA: Diagnosis not present

## 2017-03-27 NOTE — Telephone Encounter (Signed)
[name] [name] will mail

## 2017-03-27 NOTE — Telephone Encounter (Signed)
Done

## 2017-03-31 DIAGNOSIS — L97212 Non-pressure chronic ulcer of right calf with fat layer exposed: Secondary | ICD-10-CM | POA: Diagnosis not present

## 2017-04-01 ENCOUNTER — Ambulatory Visit (INDEPENDENT_AMBULATORY_CARE_PROVIDER_SITE_OTHER): Payer: Medicare Other | Admitting: *Deleted

## 2017-04-01 DIAGNOSIS — Z5181 Encounter for therapeutic drug level monitoring: Secondary | ICD-10-CM

## 2017-04-01 DIAGNOSIS — I4891 Unspecified atrial fibrillation: Secondary | ICD-10-CM

## 2017-04-01 LAB — POCT INR: INR: 2.8

## 2017-04-03 ENCOUNTER — Ambulatory Visit (INDEPENDENT_AMBULATORY_CARE_PROVIDER_SITE_OTHER): Payer: Medicare Other | Admitting: Interventional Cardiology

## 2017-04-03 ENCOUNTER — Encounter: Payer: Self-pay | Admitting: Interventional Cardiology

## 2017-04-03 VITALS — BP 116/72 | HR 60 | Ht 76.0 in

## 2017-04-03 DIAGNOSIS — I480 Paroxysmal atrial fibrillation: Secondary | ICD-10-CM | POA: Diagnosis not present

## 2017-04-03 DIAGNOSIS — R600 Localized edema: Secondary | ICD-10-CM

## 2017-04-03 NOTE — Patient Instructions (Signed)
Medication Instructions:  Your physician recommends that you continue on your current medications as directed. Please refer to the Current Medication list given to you today.   On the days you are missing your afternoon dose of 40 mg Lasix, take 80 mg in the morning.  Otherwise continue taking 40 mg lasix twice a day.  Labwork: None ordered.  Testing/Procedures: None ordered.   Follow-Up: Appointment for CHF clinic is on May 07, 2017 at 11:40 am.  Your physician recommends that you schedule a follow-up appointment with Dr. Irish Lack after your CHF appointment in June 2018.    Any Other Special Instructions Will Be Listed Below (If Applicable).     If you need a refill on your cardiac medications before your next appointment, please call your pharmacy.

## 2017-04-03 NOTE — Progress Notes (Signed)
Cardiology Office Note   Date:  04/03/2017   ID:  [name] [name], DOB [birth year], MRN [id number]  PCP:  [name], MD    No chief complaint on file. f/u AFib   Wt Readings from Last 3 Encounters:  03/11/17 204 lb 12.8 oz (92.9 kg)  02/25/17 202 lb (91.6 kg)  02/23/17 197 lb (89.4 kg)       History of Present Illness: [name] [name] is a 83 y.o. male  followed in the past by Dr. Mare Ferrari. His past cardiac history is notable for PAF/Flutter. He is on chronic anticoagulation with Coumadin. He also has a h/o severe mitral valve prolapse. He underwent mitral valve repair by Dr. Roxy Manns on 10/01/11. He did not have any coronary disease. In May 2015 the patient had an episode of syncope at home secondary to dehydration and a urinary tract infection. He had atrial fibrillation recurrence felt to be secondary to the stress of the UTI. He subsequently went back in normal sinus rhythm and was continued on warfarin.   The patient was seen on 12/05/15 and at that time he was noted to be back in atrial flutter with variable ventricular response. 2D echo was obtained showingan EF of 45-50% with G2DD. There was grade 2 diastolic dysfunction. Mitral valve shows satisfactory function with mild stenosis and mild regurgitation.The left atrium was mildly dilated. The right atrium was severely dilated. He was started on amiodarone. His warfarin was continued.He did not convert on amiodarone. Therefore outpatient cardioversion was arranged.On 01/02/16, the patient underwent synchronized cardioversion using 50 J by Dr. Acie Fredrickson with successful cardioversion to normal sinus rhythm from atrial flutter.  Also of note, he had a recent CXR in 12/2015 which showed decreased aeration at the base and a follow-up chest 2 x-ray was recommended to assure clearing. Dr. Mare Ferrari arranged for this to be done. F/u CXR was done 02/07/16. Radiologist reports is as follows:  IMPRESSION: 1. Stable opacity at the  left lung base overlying the left heart border, with additional stable blunting of the adjacent left costophrenic angle. As described on the earlier chest x-ray report, this could represent infiltrate and adjacent pleural effusion, but underlying mass not excluded. Another consideration would be chronic pleural thickening/scarring. Given the persistence since chest x-ray of 12/27/2015, would consider chest CT at this point to exclude the possibility of neoplastic mass. 2. Hyperexpanded lungs suggesting COPD.    He has been fully compliant with Coumadin. No significant abnormal bleeding, other than bruising. His INR is followed in our Coumadin Clinic.   Had a negative chest CT scan for malignancy.  Still has atelectasis on his most recent CXray.  No SHOB.    No symptoms of breakthrough atrial fibrillation since his cardioversion or on Amio 200 mg daily. No palpitations, dyspnea or chest pain. He also denies wheezing and productive cough. He denies any prior h/o tobacco abuse.   He had worsening leg swelling in January 2018.  He had laser surgery of the right leg veins.  THe left leg has increased in swelling and the skin is seeping some fluid.  He was managed at [facility].  Echo showed severe TR and dilated RV.  This was thought to be the cause of his swelling. Lasix was prescribed, but not taken daily due to it being inconvenient for him.  He spends about 5 days /week at home without going out.  In early April 2018, we increased the dose of the Lasix and he was agreeable  to taking it.  I spoke to him and his daughter at a prior visit.  They were hoping to avoid any invasive testing and hoping this could be managed conservatively.  Since the last visit a few weeks ago, he reports that his left leg swelling is better but that he has missed his second dose if Lasix at times.  He has home health wound care checking on his legs.  He has seepage of fluid throughout the day.  Awaiting CHF appt on  6/21.    Past Medical History:  Diagnosis Date  . Abnormality of gait 05/27/2016  . Adenomatous polyps   . Carpal tunnel syndrome 06/25/2016   Right  . Depressive disorder, not elsewhere classified   . First degree AV block    Holter 3/18: NSR, PACs, PVCs, no AFib, no pauses.  Marland Kitchen Hereditary and idiopathic peripheral neuropathy 06/25/2016  . Hypertension   . Internal nasal lesion 05/15/2013  . Melanoma ([place])    Left Shoulder  . Mitral regurgitation   . MVP (mitral valve prolapse)    a. With severe MR s/p Complex valvuloplasty including artificial Gore-tex neochord placement x4, chordal transposition x1, chordal release x1, # 32 mm Sorin Memo 3D Ring Annuloplasty 2012. // b. Echo 2/18: mild LVH, EF 50-55, mild AI, MV repair with mild MR, mod LAE, mod RVE, severe RAE, severe TR  . Neuropathy   . Normal coronary arteries    a. Normal coronary anatomy by cath 2012.  . Osteoarthritis    Knees  . PAF (paroxysmal atrial fibrillation) ([place])    a. Post-op MVR 2012.  Marland Kitchen Personal history of colonic polyps   . Prostate cancer ([place])   . Pulmonary HTN ([place])    a. Mild-mod by cath 2012.  . Pure hypercholesterolemia   . PVC (premature ventricular contraction)   . Thrombocytopenia ([place])   . Vision abnormalities    Cornea scarring    Past Surgical History:  Procedure Laterality Date  . CARDIAC CATHETERIZATION  09/2011   Pre-op for MVR -- normal coronaries.  Marland Kitchen CARDIOVERSION N/A 01/02/2016   Procedure: CARDIOVERSION;  Surgeon: Thayer Headings, MD;  Location: [facility] ENDOSCOPY;  Service: Cardiovascular;  Laterality: N/A;  . COLONOSCOPY W/ POLYPECTOMY    . INGUINAL HERNIA REPAIR  09/2009   Left  . KNEE ARTHROSCOPY      left x3  and right x2  . Melanoma Surgery     2001, 2005, 2006, 2009  . MITRAL VALVE REPAIR  10/01/2011   complex valvuloplasty with Goretex cord replacement and chordal transposition 67mm Sorin Memo 3D ring annuloplasty  . Nuclear Stress Test  09/2006   EF-64%, Normal  .  PROSTATECTOMY  1993  . ROOT CANAL  08-19-12  . ROTATOR CUFF REPAIR  2003   left  . TEE WITHOUT CARDIOVERSION  09/26/2011   Procedure: TRANSESOPHAGEAL ECHOCARDIOGRAM (TEE);  Surgeon: Lelon Perla, MD;  Location: [facility] ENDOSCOPY;  Service: Cardiovascular;  Laterality: N/A;  . US ECHOCARDIOGRAPHY  09/2009, 08/1011   mild LVH,mild AI,MVP with mild MR, mild-mod. TR with mild Pulm. HTN, EF-55-60%     Current Outpatient Prescriptions  Medication Sig Dispense Refill  . amiodarone (PACERONE) 200 MG tablet Take 200 mg by mouth daily.     Marland Kitchen CARTIA XT 300 MG 24 hr capsule Take 300 mg by mouth daily.     . furosemide (LASIX) 40 MG tablet Take 1 tablet (40 mg total) by mouth 2 (two) times daily. 180 tablet 3  . gabapentin (  NEURONTIN) 300 MG capsule Take 300 mg by mouth 2 (two) times daily.     Marland Kitchen MYRBETRIQ 50 MG TB24 tablet Take 1 tablet by mouth daily.    . potassium chloride SA (K-DUR,KLOR-CON) 20 MEQ tablet Take 1 tablet (20 mEq total) by mouth daily. 90 tablet 2  . rosuvastatin (CRESTOR) 5 MG tablet Take 5 mg by mouth daily at 6 PM.     . TOPROL XL 50 MG 24 hr tablet Take 50 mg by mouth daily.     . traZODone (DESYREL) 100 MG tablet Take 300 mg by mouth at bedtime.     . vitamin C (ASCORBIC ACID) 500 MG tablet Take 500 mg by mouth 2 (two) times daily.    Marland Kitchen warfarin (COUMADIN) 5 MG tablet TAKE 1 TABLET DAILY AS DIRECTED BY THE COUMADIN CLINIC 30 tablet 3   No current facility-administered medications for this visit.     Allergies:   Patient has no known allergies.    Social History:  The patient  reports that he has never smoked. He has never used smokeless tobacco. He reports that he does not drink alcohol or use drugs.   Family History:  The patient's family history includes Arthritis in his mother; Clotting disorder in his brother; Hypertension in his father and mother; Psychosis in his father; Stroke in his mother.    ROS:  Please see the history of present illness.   Otherwise, review of  systems are positive for swelling in his legs when he misses his Lasix.   All other systems are reviewed and negative.    PHYSICAL EXAM: VS:  BP 116/72   Pulse 60   Ht 6\' 4"  (1.93 m)   SpO2 94%  , BMI There is no height or weight on file to calculate BMI. GEN: Well nourished, well developed, in no acute distress  HEENT: normal  Neck: elevated JVD, no carotid bruits, or masses Cardiac: RRR; no murmurs, rubs, or gallops, 3+ bilateral pitting LE edema- dressing are damp  Respiratory:  clear to auscultation bilaterally, normal work of breathing GI: soft, nontender, nondistended, + BS MS: no deformity or atrophy  Skin: warm and dry, no rash Neuro:  Strength and sensation are intact Psych: euthymic mood, full affect   EKG:   The ekg ordered today demonstrates NSR, no ST segment changes   Recent Labs: 08/25/2016: ALT 22; TSH 0.73 02/23/2017: BUN 25; Creatinine, Ser 1.14; Potassium 5.0; Sodium 143   Lipid Panel    Component Value Date/Time   CHOL 112 (L) 11/02/2015 1048   TRIG 66 11/02/2015 1048   HDL 49 11/02/2015 1048   CHOLHDL 2.3 11/02/2015 1048   VLDL 13 11/02/2015 1048   LDLCALC 50 11/02/2015 1048     Other studies Reviewed: Additional studies/ records that were reviewed today with results demonstrating: echo in 2/18 as resulted above.   ASSESSMENT AND PLAN:  1. PAF: Maintainting NSR on amiodarone.  Coumadin for stroke prevention.  No bleeding issues.  Benefits of anticoagulation outweight the risks currently. 2. Systolic and diastolic heart failure: chronic:  MV repair from 2012 appears to be functioning well by the last echo.  Worsening leg swelling is a sx of right heart failure. Prominent neck veins as well. Related to TR and RV dysfunction.  He was again advised to increase Lasix dose to 40 mg BID on the days he is at home.  Still missing second dose on some days.  Change to 80 mg daily on these days  where he cant take the second dose if the afternoon.  Potassium has  been high. COntinue current potassium dose.  No signs of left sided heart failure.   By echo, His TR has progressed from mild to severe and this has affected RV function.   3. Anticoagulation: Warfarin for stroke prevention. 4.   Elevate legs at night.  Compression stockings when his leg sizes allow.  Was seen at [facility] wound care.  Try to use conservative measures: Diuretics, leg elevation, compression.  Will see CHF MDs as well to see if there are any other options or if they have suggestions about increased diuretics.  He is hesitant to have any invasive testing.   Daughter getting chemotherapy at [facility] currently.  Son lives in [place].   Current medicines are reviewed at length with the patient today.  The patient concerns regarding his medicines were addressed.  The following changes have been made:  Increase Lasix  Labs/ tests ordered today include:   No orders of the defined types were placed in this encounter.   Recommend 150 minutes/week of aerobic exercise if this can be done safely Low fat, low carb, high fiber diet recommended  Disposition:   FU with CHF clinic   Signed, Larae Grooms, MD  04/03/2017 1:51 PM    [facility] [address], [place], [place]  [address] Phone: [phone]; Fax: [phone]

## 2017-04-07 ENCOUNTER — Telehealth: Payer: Self-pay | Admitting: Family Medicine

## 2017-04-07 ENCOUNTER — Telehealth (HOSPITAL_COMMUNITY): Payer: Self-pay | Admitting: Internal Medicine

## 2017-04-07 ENCOUNTER — Telehealth: Payer: Self-pay | Admitting: Interventional Cardiology

## 2017-04-07 DIAGNOSIS — L97212 Non-pressure chronic ulcer of right calf with fat layer exposed: Secondary | ICD-10-CM | POA: Diagnosis not present

## 2017-04-07 NOTE — Telephone Encounter (Signed)
Caller name: Lattie Haw Relationship to patient: [facility] Can be reached: [phone] Pharmacy:  Reason for call: Request copy of last office notes be faxed to [phone]..  Ask if it can be faxed today if possible.

## 2017-04-07 NOTE — Telephone Encounter (Signed)
New message    Lattie Haw is calling to confirm diagnosis for pt.

## 2017-04-07 NOTE — Telephone Encounter (Signed)
Per staff message from Kevan Rosebush, RN, called patient to try to get him a sooner appt than 05/07/17 with Dr. Haroldine Laws.  No answer, left message on patient's VM to return my call.  Tried pt's mobile but no answer and no VM.

## 2017-04-07 NOTE — Telephone Encounter (Signed)
[name] [name] with [facility] requesting CHF diagnosis information be faxed to [phone]. Message routed to Medical Records.

## 2017-04-07 NOTE — Telephone Encounter (Signed)
HHRN will cal [facility] for this request

## 2017-04-08 ENCOUNTER — Ambulatory Visit (HOSPITAL_COMMUNITY)
Admission: RE | Admit: 2017-04-08 | Discharge: 2017-04-08 | Disposition: A | Discharge status: Home / Self Care | Payer: Medicare Other | Source: Ambulatory Visit | Attending: Vascular Surgery | Admitting: Vascular Surgery

## 2017-04-08 ENCOUNTER — Other Ambulatory Visit: Payer: Self-pay | Admitting: Surgery

## 2017-04-08 DIAGNOSIS — L97909 Non-pressure chronic ulcer of unspecified part of unspecified lower leg with unspecified severity: Secondary | ICD-10-CM | POA: Insufficient documentation

## 2017-04-08 NOTE — Progress Notes (Unsigned)
abi

## 2017-04-08 NOTE — Telephone Encounter (Signed)
Called and spoke with patient, he is agreeable to changing his appt to 04/21/17 @3 :20 with Dr. Haroldine Laws.

## 2017-04-15 DIAGNOSIS — L97212 Non-pressure chronic ulcer of right calf with fat layer exposed: Secondary | ICD-10-CM | POA: Diagnosis not present

## 2017-04-15 NOTE — Telephone Encounter (Signed)
Left pt message asking to call Allison back directly at [phone] to schedule AWV. Thanks! °

## 2017-04-21 ENCOUNTER — Ambulatory Visit (HOSPITAL_COMMUNITY)
Admission: RE | Admit: 2017-04-21 | Discharge: 2017-04-21 | Disposition: A | Discharge status: Home / Self Care | Payer: Medicare Other | Source: Ambulatory Visit | Attending: Internal Medicine | Admitting: Internal Medicine

## 2017-04-21 ENCOUNTER — Encounter (HOSPITAL_COMMUNITY): Payer: Self-pay | Admitting: Internal Medicine

## 2017-04-21 VITALS — BP 104/78 | HR 56 | Ht 77.0 in | Wt 195.0 lb

## 2017-04-21 DIAGNOSIS — I5032 Chronic diastolic (congestive) heart failure: Secondary | ICD-10-CM

## 2017-04-21 DIAGNOSIS — Z9119 Patient's noncompliance with other medical treatment and regimen: Secondary | ICD-10-CM | POA: Diagnosis not present

## 2017-04-21 DIAGNOSIS — I270 Primary pulmonary hypertension: Secondary | ICD-10-CM | POA: Diagnosis not present

## 2017-04-21 DIAGNOSIS — I110 Hypertensive heart disease with heart failure: Secondary | ICD-10-CM | POA: Insufficient documentation

## 2017-04-21 DIAGNOSIS — I071 Rheumatic tricuspid insufficiency: Secondary | ICD-10-CM | POA: Diagnosis not present

## 2017-04-21 DIAGNOSIS — I480 Paroxysmal atrial fibrillation: Secondary | ICD-10-CM | POA: Diagnosis not present

## 2017-04-21 DIAGNOSIS — Z79899 Other long term (current) drug therapy: Secondary | ICD-10-CM | POA: Insufficient documentation

## 2017-04-21 DIAGNOSIS — I4892 Unspecified atrial flutter: Secondary | ICD-10-CM | POA: Insufficient documentation

## 2017-04-21 DIAGNOSIS — Z7901 Long term (current) use of anticoagulants: Secondary | ICD-10-CM | POA: Insufficient documentation

## 2017-04-21 MED ORDER — TORSEMIDE 20 MG PO TABS: ORAL_TABLET | ORAL | 3 refills | Status: DC

## 2017-04-21 NOTE — Progress Notes (Signed)
Advanced Heart Failure Clinic Consult Note   Referring Physician: Dr. Irish [name] Primary Care: [name] Homans, MD Primary Cardiologist: Dr. Irish [name] ( Previously [name] [name]) Primary HF: New (Dr. Haroldine [name])   HPI:  [name] [name] ([name]) is an 83 y.o. male with history of diastolic HF, PAF/Aflutter on coumadin, and h/o severe MV prolapse s/p MV repair 10/01/11 who is referred for further evaluation of his heart failure.   Pt seen in Dr. Irish [name] office 03/11/17 and noted to have worsening peripheral edema. Thought to be related to RV failure.  Pt admitted non-compliance with second lasix dose some days. RHC suggested but pt prefers to avoid invasive procedures at this time. Lasix increased and referred to [facility] for wound care of BLEs.   Pt presents today to establish in the HF clinic. Pt weight is down 9 lbs since lasix increased at last visit with [facility] as above. Legs remain very swollen but sores have improved since being at [facility] wound care center. Doesn't weigh at home. Uses a walker, but can only walk about 10-20 feet. Knees are very painful and legs are heavy when he tries to walk around.  Occasional bendopnea and mild SOB when getting dressed. Denies orthopnea, but sleeps on 4 small pillows. Takes lasix twice daily, unless he is going out of the house. No CP or syncope.   Echo 12/19/16 with LVEF 50-55%, Mild AI, s/p MVR with mild residual MR, Mod LAE, Mod RV dilation, severe RAE, Severe TR.  Review of Systems: [y] = yes, [ ]  = no   General: Weight gain [y]; Weight loss [ ] ; Anorexia [ ] ; Fatigue [y]; Fever [ ] ; Chills [ ] ; Weakness [ ]   Cardiac: Chest pain/pressure [ ] ; Resting SOB [ ] ; Exertional SOB [y]; Orthopnea [ ] ; Pedal Edema [y]; Palpitations [ ] ; Syncope [ ] ; Presyncope [ ] ; Paroxysmal nocturnal dyspnea[ ]   Pulmonary: Cough [ ] ; Wheezing[ ] ; Hemoptysis[ ] ; Sputum [ ] ; Snoring [ ]   GI: Vomiting[ ] ; Dysphagia[ ] ; Melena[ ] ; Hematochezia [ ] ; Heartburn[ ] ; Abdominal pain [ ] ;  Constipation [ ] ; Diarrhea [ ] ; BRBPR [ ]   GU: Hematuria[ ] ; Dysuria [ ] ; Nocturia[ ]   Vascular: Pain in legs with walking [ ] ; Pain in feet with lying flat [ ] ; Non-healing sores [ ] ; Stroke [ ] ; TIA [ ] ; Slurred speech [ ] ;  Neuro: Headaches[ ] ; Vertigo[ ] ; Seizures[ ] ; Paresthesias[ ] ;Blurred vision [ ] ; Diplopia [ ] ; Vision changes [ ]   Ortho/Skin: Arthritis [y]; Joint pain [y]; Muscle pain [ ] ; Joint swelling [ ] ; Back Pain [ ] ; Rash [ ]   Psych: Depression[ ] ; Anxiety[ ]   Heme: Bleeding problems [ ] ; Clotting disorders [ ] ; Anemia [ ]   Endocrine: Diabetes [ ] ; Thyroid dysfunction[ ]   Past Medical History:  Diagnosis Date  . Abnormality of gait 05/27/2016  . Adenomatous polyps   . Carpal tunnel syndrome 06/25/2016   Right  . Depressive disorder, not elsewhere classified   . First degree AV block    Holter 3/18: NSR, PACs, PVCs, no AFib, no pauses.  Marland Kitchen Hereditary and idiopathic peripheral neuropathy 06/25/2016  . Hypertension   . Internal nasal lesion 05/15/2013  . Melanoma ([place])    Left Shoulder  . Mitral regurgitation   . MVP (mitral valve prolapse)    a. With severe MR s/p Complex valvuloplasty including artificial Gore-tex neochord placement x4, chordal transposition x1, chordal release x1, # 32 mm Sorin Memo 3D Ring Annuloplasty 2012. // b. Echo 2/18: mild LVH, EF  50-55, mild AI, MV repair with mild MR, mod LAE, mod RVE, severe RAE, severe TR  . Neuropathy   . Normal coronary arteries    a. Normal coronary anatomy by cath 2012.  . Osteoarthritis    Knees  . PAF (paroxysmal atrial fibrillation) ([place])    a. Post-op MVR 2012.  Marland Kitchen Personal history of colonic polyps   . Prostate cancer ([place])   . Pulmonary HTN ([place])    a. Mild-mod by cath 2012.  . Pure hypercholesterolemia   . PVC (premature ventricular contraction)   . Thrombocytopenia ([place])   . Vision abnormalities    Cornea scarring   Current Outpatient Prescriptions  Medication Sig Dispense Refill  . amiodarone (PACERONE)  200 MG tablet Take 200 mg by mouth daily.     Marland Kitchen CARTIA XT 300 MG 24 hr capsule Take 300 mg by mouth daily.     . furosemide (LASIX) 40 MG tablet Take 1 tablet (40 mg total) by mouth 2 (two) times daily. 180 tablet 3  . gabapentin (NEURONTIN) 300 MG capsule Take 300 mg by mouth 2 (two) times daily.     Marland Kitchen MYRBETRIQ 50 MG TB24 tablet Take 1 tablet by mouth daily.    . potassium chloride SA (K-DUR,KLOR-CON) 20 MEQ tablet Take 1 tablet (20 mEq total) by mouth daily. 90 tablet 2  . rosuvastatin (CRESTOR) 5 MG tablet Take 5 mg by mouth daily at 6 PM.     . TOPROL XL 50 MG 24 hr tablet Take 50 mg by mouth daily.     . traZODone (DESYREL) 100 MG tablet Take 300 mg by mouth at bedtime.     . vitamin C (ASCORBIC ACID) 500 MG tablet Take 500 mg by mouth 2 (two) times daily.    Marland Kitchen warfarin (COUMADIN) 5 MG tablet TAKE 1 TABLET DAILY AS DIRECTED BY THE COUMADIN CLINIC 30 tablet 3   No current facility-administered medications for this encounter.    No Known Allergies   Social History   Social History  . Marital status: Widowed    Spouse name: N/A  . Number of children: 2  . Years of education: 49   Occupational History  . Not on file.   Social History Main Topics  . Smoking status: Never Smoker  . Smokeless tobacco: Never Used  . Alcohol use No     Comment: Last drink in 2000  . Drug use: No  . Sexual activity: Not on file   Other Topics Concern  . Not on file   Social History Narrative   Retired - Optometrist   Widower   2 children   Drinks 1 cup of coffee per day   Family History  Problem Relation Age of Onset  . Clotting disorder Brother        CVA's  . Arthritis Mother   . Hypertension Mother   . Stroke Mother   . Hypertension Father   . Psychosis Father        psychiatric care  . Colon cancer Neg Hx   . Stomach cancer Neg Hx   . Heart attack Neg Hx    Vitals:   04/21/17 1535  BP: 104/78  Pulse: (!) 56  SpO2: 91%  Weight: 195 lb (88.5 kg)  Height: 6\' 5"  (1.956 m)    Wt Readings from Last 3 Encounters:  04/21/17 195 lb (88.5 kg)  03/11/17 204 lb 12.8 oz (92.9 kg)  02/25/17 202 lb (91.6 kg)    PHYSICAL EXAM:  General:  Elderly appearing. NAD.  HEENT: Normal Neck: supple. JVP to jaw with prominent CV waves. Carotids 2+ bilat; no bruits. No lymphadenopathy or thyromegaly appreciated. Cor: PMI nondisplaced. Regular rate & rhythm. 2/6 TR. + RV lift Lungs: CTAB, normal effort.  Abdomen: Soft, nontender, ++ distended. No hepatosplenomegaly. No bruits or masses. Good bowel sounds. Extremities: no cyanosis, clubbing, or rash. 3+ edema up into thighs with leg wraps in place.  Neuro: alert & oriented x 3, cranial nerves grossly intact. moves all 4 extremities w/o difficulty. Affect pleasant.  ASSESSMENT & PLAN:  1. Combined diastolic HF with R>L symptoms - Echo 12/19/16 with LVEF 50-55%, Mild AI, s/p MVR with mild residual MR, Mod LAE, Mod RV dilation, severe RAE, Severe TR. - NYHA III-IIIb, difficult to assess with limited mobility, but can only walk short distances - Volume status markedly elevated on exam. Marked peripheral edema and JVP confounded by severe TR.  - Stop lasix.  Change to torsemide 40 mg q am and 20 mg q pm.  - Reinforced fluid restriction to < 2 L daily, sodium restriction to less than 2000 mg daily, and the importance of daily weights.   2. PAF - NSR by exam. Continue coumadin.  - Continue amiodarone 200 mg daily. 3. Severe TR - Likely contributing to his right sided symptoms - Would likely be a poor candidate for surgical intervention, and has been hesitant to consider invasive procedures. Hopefully will improve with diuresis.  4. 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 - Will plan to diurese for now. May eventually benefit from [place], PA-C 04/21/17   Patient seen and examined with the above-signed Advanced Practice Provider and/or Housestaff. I personally reviewed  laboratory data, imaging studies and relevant notes. I independently examined the patient and formulated the important aspects of the plan. I have edited the note to reflect any of my changes or salient points. I have personally discussed the plan with the patient and/or family.  I have reviewed echo and Dr. Hassell Done notes personally. [name] [name] has severe RV failure in setting of longstanding MV disease (likely a combination of WHO Group I and II disease as the pulmonary vascular bed has likely remodeled with longstanding MR). He now has massive volume overload which is responding sluggishly due po lasix. We offered him admission to the hospital for IV diuresis versus trial of torsemide at home. He prefers to start torsemide and see how it goes. We will check on him later this week to see if he is making any progress. If not responding will admit for IV diuresis and possible RHC. Long talk about pathophysiology of pulmonary HTN.   Total time spent > 60 minutes. Over half that time spent discussing above.   [name], MD  9:10 PM

## 2017-04-21 NOTE — Patient Instructions (Signed)
START Torsemide 40 mg (2 Tablets) in the AM and 20 mg (1 Tablet) in the PM  Follow up with labs this Friday (BMET)  Follow up in 1 week.

## 2017-04-22 ENCOUNTER — Encounter (HOSPITAL_BASED_OUTPATIENT_CLINIC_OR_DEPARTMENT_OTHER): Payer: Medicare Other | Attending: Surgery

## 2017-04-22 DIAGNOSIS — I890 Lymphedema, not elsewhere classified: Secondary | ICD-10-CM | POA: Diagnosis not present

## 2017-04-22 DIAGNOSIS — I110 Hypertensive heart disease with heart failure: Secondary | ICD-10-CM | POA: Diagnosis not present

## 2017-04-22 DIAGNOSIS — L97821 Non-pressure chronic ulcer of other part of left lower leg limited to breakdown of skin: Secondary | ICD-10-CM | POA: Diagnosis present

## 2017-04-22 DIAGNOSIS — L97812 Non-pressure chronic ulcer of other part of right lower leg with fat layer exposed: Secondary | ICD-10-CM | POA: Insufficient documentation

## 2017-04-22 DIAGNOSIS — I5041 Acute combined systolic (congestive) and diastolic (congestive) heart failure: Secondary | ICD-10-CM | POA: Insufficient documentation

## 2017-04-24 ENCOUNTER — Ambulatory Visit (HOSPITAL_COMMUNITY)
Admission: RE | Admit: 2017-04-24 | Discharge: 2017-04-24 | Disposition: A | Discharge status: Home / Self Care | Payer: Medicare Other | Source: Ambulatory Visit | Attending: Cardiology | Admitting: Cardiology

## 2017-04-24 DIAGNOSIS — I5032 Chronic diastolic (congestive) heart failure: Secondary | ICD-10-CM

## 2017-04-24 LAB — BASIC METABOLIC PANEL
ANION GAP: 8 (ref 5–15)
BUN: 36 mg/dL — AB (ref 6–20)
CALCIUM: 9.1 mg/dL (ref 8.9–10.3)
CO2: 32 mmol/L (ref 22–32)
Chloride: 103 mmol/L (ref 101–111)
Creatinine, Ser: 1.89 mg/dL — ABNORMAL HIGH (ref 0.61–1.24)
GFR calc Af Amer: 36 mL/min — ABNORMAL LOW (ref >60)
GFR, EST NON AFRICAN AMERICAN: 31 mL/min — AB (ref >60)
Glucose, Bld: 149 mg/dL — ABNORMAL HIGH (ref 65–99)
Potassium: 3.5 mmol/L (ref 3.5–5.1)
Sodium: 143 mmol/L (ref 135–145)

## 2017-04-26 ENCOUNTER — Encounter (HOSPITAL_COMMUNITY): Payer: Self-pay | Admitting: Emergency Medicine

## 2017-04-26 ENCOUNTER — Emergency Department (HOSPITAL_COMMUNITY): Payer: Medicare Other

## 2017-04-26 ENCOUNTER — Inpatient Hospital Stay (HOSPITAL_COMMUNITY)
Admission: EM | Admit: 2017-04-26 | Discharge: 2017-04-30 | DRG: 287 | Disposition: A | Discharge status: Skilled Nursing Facility | Payer: Medicare Other | Attending: Internal Medicine | Admitting: Internal Medicine

## 2017-04-26 DIAGNOSIS — N179 Acute kidney failure, unspecified: Secondary | ICD-10-CM | POA: Diagnosis not present

## 2017-04-26 DIAGNOSIS — E876 Hypokalemia: Secondary | ICD-10-CM | POA: Diagnosis not present

## 2017-04-26 DIAGNOSIS — M170 Bilateral primary osteoarthritis of knee: Secondary | ICD-10-CM | POA: Diagnosis present

## 2017-04-26 DIAGNOSIS — Z7901 Long term (current) use of anticoagulants: Secondary | ICD-10-CM

## 2017-04-26 DIAGNOSIS — Z9079 Acquired absence of other genital organ(s): Secondary | ICD-10-CM

## 2017-04-26 DIAGNOSIS — S81802S Unspecified open wound, left lower leg, sequela: Secondary | ICD-10-CM

## 2017-04-26 DIAGNOSIS — Z8261 Family history of arthritis: Secondary | ICD-10-CM

## 2017-04-26 DIAGNOSIS — L89150 Pressure ulcer of sacral region, unstageable: Secondary | ICD-10-CM | POA: Diagnosis present

## 2017-04-26 DIAGNOSIS — Z8249 Family history of ischemic heart disease and other diseases of the circulatory system: Secondary | ICD-10-CM

## 2017-04-26 DIAGNOSIS — I5033 Acute on chronic diastolic (congestive) heart failure: Secondary | ICD-10-CM | POA: Diagnosis not present

## 2017-04-26 DIAGNOSIS — E7800 Pure hypercholesterolemia, unspecified: Secondary | ICD-10-CM | POA: Diagnosis present

## 2017-04-26 DIAGNOSIS — I2721 Secondary pulmonary arterial hypertension: Secondary | ICD-10-CM | POA: Diagnosis present

## 2017-04-26 DIAGNOSIS — Z8546 Personal history of malignant neoplasm of prostate: Secondary | ICD-10-CM | POA: Diagnosis not present

## 2017-04-26 DIAGNOSIS — I50812 Chronic right heart failure: Secondary | ICD-10-CM

## 2017-04-26 DIAGNOSIS — I5032 Chronic diastolic (congestive) heart failure: Secondary | ICD-10-CM

## 2017-04-26 DIAGNOSIS — I480 Paroxysmal atrial fibrillation: Secondary | ICD-10-CM | POA: Diagnosis present

## 2017-04-26 DIAGNOSIS — I110 Hypertensive heart disease with heart failure: Principal | ICD-10-CM | POA: Diagnosis present

## 2017-04-26 DIAGNOSIS — I50810 Right heart failure, unspecified: Secondary | ICD-10-CM | POA: Diagnosis present

## 2017-04-26 DIAGNOSIS — I50813 Acute on chronic right heart failure: Secondary | ICD-10-CM | POA: Diagnosis present

## 2017-04-26 DIAGNOSIS — L89312 Pressure ulcer of right buttock, stage 2: Secondary | ICD-10-CM | POA: Diagnosis present

## 2017-04-26 DIAGNOSIS — W19XXXA Unspecified fall, initial encounter: Secondary | ICD-10-CM

## 2017-04-26 DIAGNOSIS — Z832 Family history of diseases of the blood and blood-forming organs and certain disorders involving the immune mechanism: Secondary | ICD-10-CM | POA: Diagnosis not present

## 2017-04-26 DIAGNOSIS — G609 Hereditary and idiopathic neuropathy, unspecified: Secondary | ICD-10-CM | POA: Diagnosis present

## 2017-04-26 DIAGNOSIS — Z9889 Other specified postprocedural states: Secondary | ICD-10-CM | POA: Diagnosis not present

## 2017-04-26 DIAGNOSIS — E785 Hyperlipidemia, unspecified: Secondary | ICD-10-CM | POA: Diagnosis present

## 2017-04-26 DIAGNOSIS — Z85820 Personal history of malignant melanoma of skin: Secondary | ICD-10-CM

## 2017-04-26 DIAGNOSIS — D696 Thrombocytopenia, unspecified: Secondary | ICD-10-CM | POA: Diagnosis present

## 2017-04-26 DIAGNOSIS — M6250 Muscle wasting and atrophy, not elsewhere classified, unspecified site: Secondary | ICD-10-CM | POA: Diagnosis present

## 2017-04-26 DIAGNOSIS — I482 Chronic atrial fibrillation: Secondary | ICD-10-CM | POA: Diagnosis not present

## 2017-04-26 DIAGNOSIS — K766 Portal hypertension: Secondary | ICD-10-CM | POA: Diagnosis not present

## 2017-04-26 DIAGNOSIS — Z79899 Other long term (current) drug therapy: Secondary | ICD-10-CM

## 2017-04-26 DIAGNOSIS — Z86010 Personal history of colonic polyps: Secondary | ICD-10-CM

## 2017-04-26 DIAGNOSIS — K219 Gastro-esophageal reflux disease without esophagitis: Secondary | ICD-10-CM | POA: Diagnosis present

## 2017-04-26 DIAGNOSIS — T148XXA Other injury of unspecified body region, initial encounter: Secondary | ICD-10-CM | POA: Diagnosis not present

## 2017-04-26 DIAGNOSIS — R296 Repeated falls: Secondary | ICD-10-CM | POA: Diagnosis present

## 2017-04-26 DIAGNOSIS — Z823 Family history of stroke: Secondary | ICD-10-CM

## 2017-04-26 DIAGNOSIS — I4891 Unspecified atrial fibrillation: Secondary | ICD-10-CM | POA: Diagnosis present

## 2017-04-26 DIAGNOSIS — Z9114 Patient's other noncompliance with medication regimen: Secondary | ICD-10-CM

## 2017-04-26 DIAGNOSIS — R791 Abnormal coagulation profile: Secondary | ICD-10-CM | POA: Diagnosis present

## 2017-04-26 DIAGNOSIS — L8990 Pressure ulcer of unspecified site, unspecified stage: Secondary | ICD-10-CM | POA: Diagnosis present

## 2017-04-26 DIAGNOSIS — I368 Other nonrheumatic tricuspid valve disorders: Secondary | ICD-10-CM | POA: Diagnosis present

## 2017-04-26 DIAGNOSIS — F329 Major depressive disorder, single episode, unspecified: Secondary | ICD-10-CM | POA: Diagnosis present

## 2017-04-26 DIAGNOSIS — R531 Weakness: Secondary | ICD-10-CM | POA: Diagnosis present

## 2017-04-26 LAB — PROTIME-INR
INR: 3.77
PROTHROMBIN TIME: 38.2 s — AB (ref 11.4–15.2)

## 2017-04-26 LAB — CBC WITH DIFFERENTIAL/PLATELET
BASOS ABS: 0 10*3/uL (ref 0.0–0.1)
BASOS PCT: 0 %
EOS ABS: 0 10*3/uL (ref 0.0–0.7)
Eosinophils Relative: 0 %
HEMATOCRIT: 34.3 % — AB (ref 39.0–52.0)
Hemoglobin: 10.2 g/dL — ABNORMAL LOW (ref 13.0–17.0)
Lymphocytes Relative: 14 %
Lymphs Abs: 1 10*3/uL (ref 0.7–4.0)
MCH: 25.4 pg — ABNORMAL LOW (ref 26.0–34.0)
MCHC: 29.7 g/dL — AB (ref 30.0–36.0)
MCV: 85.3 fL (ref 78.0–100.0)
MONO ABS: 2 10*3/uL — AB (ref 0.1–1.0)
Monocytes Relative: 29 %
NEUTROS PCT: 57 %
Neutro Abs: 3.9 10*3/uL (ref 1.7–7.7)
Platelets: 64 10*3/uL — ABNORMAL LOW (ref 150–400)
RBC: 4.02 MIL/uL — ABNORMAL LOW (ref 4.22–5.81)
RDW: 16.8 % — AB (ref 11.5–15.5)
WBC: 6.9 10*3/uL (ref 4.0–10.5)

## 2017-04-26 LAB — COMPREHENSIVE METABOLIC PANEL
ALT: 24 U/L (ref 17–63)
ANION GAP: 7 (ref 5–15)
AST: 26 U/L (ref 15–41)
Albumin: 3.4 g/dL — ABNORMAL LOW (ref 3.5–5.0)
Alkaline Phosphatase: 55 U/L (ref 38–126)
BILIRUBIN TOTAL: 1.9 mg/dL — AB (ref 0.3–1.2)
BUN: 36 mg/dL — ABNORMAL HIGH (ref 6–20)
CO2: 33 mmol/L — ABNORMAL HIGH (ref 22–32)
Calcium: 9 mg/dL (ref 8.9–10.3)
Chloride: 104 mmol/L (ref 101–111)
Creatinine, Ser: 1.91 mg/dL — ABNORMAL HIGH (ref 0.61–1.24)
GFR calc Af Amer: 35 mL/min — ABNORMAL LOW (ref >60)
GFR calc non Af Amer: 31 mL/min — ABNORMAL LOW (ref >60)
Glucose, Bld: 121 mg/dL — ABNORMAL HIGH (ref 65–99)
Potassium: 3.6 mmol/L (ref 3.5–5.1)
Sodium: 144 mmol/L (ref 135–145)
TOTAL PROTEIN: 7 g/dL (ref 6.5–8.1)

## 2017-04-26 LAB — URINALYSIS, ROUTINE W REFLEX MICROSCOPIC
BILIRUBIN URINE: NEGATIVE
Glucose, UA: NEGATIVE mg/dL
Hgb urine dipstick: NEGATIVE
KETONES UR: NEGATIVE mg/dL
LEUKOCYTES UA: NEGATIVE
NITRITE: NEGATIVE
PH: 8 (ref 5.0–8.0)
Protein, ur: NEGATIVE mg/dL
Specific Gravity, Urine: 1.009 (ref 1.005–1.030)

## 2017-04-26 LAB — LIPASE, BLOOD: LIPASE: 37 U/L (ref 11–51)

## 2017-04-26 MED ORDER — CYCLOSPORINE 0.05 % OP EMUL
1.0000 [drp] | Freq: Two times a day (BID) | OPHTHALMIC | Status: DC
  Administered 2017-04-26 – 2017-04-30 (×8): 1 [drp] via OPHTHALMIC
  Filled 2017-04-26 (×8): qty 1

## 2017-04-26 MED ORDER — DILTIAZEM HCL ER COATED BEADS 180 MG PO CP24
300.0000 mg | ORAL_CAPSULE | Freq: Every day | ORAL | Status: DC
  Administered 2017-04-27 – 2017-04-30 (×4): 300 mg via ORAL
  Filled 2017-04-26 (×4): qty 1

## 2017-04-26 MED ORDER — TRAZODONE HCL 150 MG PO TABS
300.0000 mg | ORAL_TABLET | Freq: Every day | ORAL | Status: DC
Start: 2017-04-26 — End: 2017-04-30
  Administered 2017-04-26 – 2017-04-29 (×4): 300 mg via ORAL
  Filled 2017-04-26 (×4): qty 2

## 2017-04-26 MED ORDER — ADULT MULTIVITAMIN W/MINERALS CH
1.0000 | ORAL_TABLET | Freq: Every evening | ORAL | Status: DC
Start: 2017-04-27 — End: 2017-04-30
  Administered 2017-04-27 – 2017-04-29 (×3): 1 via ORAL
  Filled 2017-04-26 (×3): qty 1

## 2017-04-26 MED ORDER — ROSUVASTATIN CALCIUM 10 MG PO TABS
5.0000 mg | ORAL_TABLET | Freq: Every day | ORAL | Status: DC
  Administered 2017-04-27 – 2017-04-29 (×3): 5 mg via ORAL
  Filled 2017-04-26 (×3): qty 1

## 2017-04-26 MED ORDER — VITAMIN C 500 MG PO TABS
500.0000 mg | ORAL_TABLET | Freq: Two times a day (BID) | ORAL | Status: DC
  Administered 2017-04-26 – 2017-04-30 (×8): 500 mg via ORAL
  Filled 2017-04-26 (×9): qty 1

## 2017-04-26 MED ORDER — GLUCOSAMINE-CHONDROITIN 500-400 MG PO TABS: 1.0000 | ORAL_TABLET | Freq: Two times a day (BID) | ORAL | Status: DC

## 2017-04-26 MED ORDER — SODIUM CHLORIDE 0.9 % IV BOLUS (SEPSIS)
250.0000 mL | Freq: Once | INTRAVENOUS | Status: AC
  Administered 2017-04-26: 250 mL via INTRAVENOUS

## 2017-04-26 MED ORDER — ONDANSETRON HCL 4 MG/2ML IJ SOLN: 4.0000 mg | Freq: Four times a day (QID) | INTRAMUSCULAR | Status: DC | PRN

## 2017-04-26 MED ORDER — SODIUM CHLORIDE 0.9% FLUSH: 3.0000 mL | INTRAVENOUS | Status: DC | PRN

## 2017-04-26 MED ORDER — FUROSEMIDE 10 MG/ML IJ SOLN
80.0000 mg | Freq: Three times a day (TID) | INTRAMUSCULAR | Status: DC
  Administered 2017-04-26 – 2017-04-28 (×6): 80 mg via INTRAVENOUS
  Filled 2017-04-26 (×6): qty 8

## 2017-04-26 MED ORDER — SODIUM CHLORIDE 0.9 % IV SOLN
INTRAVENOUS | Status: DC
  Administered 2017-04-26: 14:00:00 via INTRAVENOUS

## 2017-04-26 MED ORDER — SODIUM CHLORIDE 0.9% FLUSH
3.0000 mL | Freq: Two times a day (BID) | INTRAVENOUS | Status: DC
  Administered 2017-04-26 – 2017-04-30 (×8): 3 mL via INTRAVENOUS

## 2017-04-26 MED ORDER — ACETAMINOPHEN 325 MG PO TABS: 650.0000 mg | ORAL_TABLET | ORAL | Status: DC | PRN

## 2017-04-26 MED ORDER — SODIUM CHLORIDE 0.9 % IV SOLN: 250.0000 mL | INTRAVENOUS | Status: DC | PRN

## 2017-04-26 MED ORDER — B COMPLEX-C PO TABS
1.0000 | ORAL_TABLET | Freq: Every day | ORAL | Status: DC
  Administered 2017-04-27 – 2017-04-30 (×4): 1 via ORAL
  Filled 2017-04-26 (×5): qty 1

## 2017-04-26 MED ORDER — GABAPENTIN 300 MG PO CAPS
300.0000 mg | ORAL_CAPSULE | Freq: Two times a day (BID) | ORAL | Status: DC
  Administered 2017-04-26 – 2017-04-30 (×8): 300 mg via ORAL
  Filled 2017-04-26 (×8): qty 1

## 2017-04-26 MED ORDER — B COMPLEX PO TABS: 1.0000 | ORAL_TABLET | Freq: Every day | ORAL | Status: DC

## 2017-04-26 MED ORDER — AMIODARONE HCL 200 MG PO TABS
200.0000 mg | ORAL_TABLET | Freq: Every day | ORAL | Status: DC
  Administered 2017-04-26 – 2017-04-30 (×5): 200 mg via ORAL
  Filled 2017-04-26 (×5): qty 1

## 2017-04-26 MED ORDER — METOPROLOL SUCCINATE ER 50 MG PO TB24: 50.0000 mg | ORAL_TABLET | Freq: Every day | ORAL | Status: DC

## 2017-04-26 MED ORDER — MIRABEGRON ER 50 MG PO TB24
50.0000 mg | ORAL_TABLET | Freq: Every day | ORAL | Status: DC
  Administered 2017-04-26 – 2017-04-30 (×5): 50 mg via ORAL
  Filled 2017-04-26 (×6): qty 1

## 2017-04-26 MED ORDER — POTASSIUM CHLORIDE CRYS ER 20 MEQ PO TBCR
20.0000 meq | EXTENDED_RELEASE_TABLET | Freq: Every day | ORAL | Status: DC
  Administered 2017-04-26: 20 meq via ORAL
  Filled 2017-04-26: qty 1

## 2017-04-26 NOTE — ED Triage Notes (Signed)
Received pt from home with c/o fell last night causing injury to right shoulder and skin tear to buttock area. Pt takes blood thinners and reports having multiple falls at home. Pt reports that his legs go weak causing his falls.

## 2017-04-26 NOTE — ED Notes (Signed)
RN at the bedside

## 2017-04-26 NOTE — Consult Note (Addendum)
CARDIOLOGY CONSULT NOTE   Patient ID: [name] [name] MRN: [id number] DOB/AGE: Apr 26, 1931 83 y.o.  Admit date: 04/26/2017  Requesting Physician: Dr. Wendee Beavers Primary Physician:   [name] Lukes, MD Primary Cardiologist: Dr. Irish Lack ( Previously Mare Ferrari) Primary HF: New (Dr. Haroldine Laws)  Reason for admission: weakness/falls, AKI and CHF  [name] [name] is a 83 y.o. male who is being seen today for the evaluation of CHF at the request of Dr. Wendee Beavers.   HPI:  [name] [name] is a 83 y.o. male with a history of HTN, HLD, chronic diastolic CHF, RV dysfunction, severe TR, pulmonary HTN, PAF/flutter on amio and coumadin, thrombocytopenia, chronic LE wounds, and h/o severe MV prolapse s/p MV repair (2012) who presented to [facility] today with weakness and falls.   He was previously followed by Dr. Irish Lack but recently established care in the advanced heart failure clinic with Dr. Haroldine Laws.  2D ECHO on 12/19/16 showed LVEF 50-55%, Mild AI, s/p MVR with mild residual MR, Mod LAE, Mod RV dilation, severe RAE, Severe TR.  He was seen in the office by Dr Irish Lack on 03/11/17 for evaluation of worsening LE edema. Weight 204 lbs. His lasix was increased and he had ~ 9 lbs weight loss. He was reluctant to pursue right heart cath at that time and was referred to the advanced CHF clinic. He was evaluated by Dr. Donata Clay on 04/21/17 and remained markedly volume overloaded on exam. He was felt to have severe RV failure in setting of longstanding MV disease (likely a combination of WHO Group I and II disease as the pulmonary vascular bed has likely remodeled with longstanding MR). Weight 195 lbs. He was offered admission but opted to trial outpatient diuresis and was started on Torsemide 40mg  in AM/20mg  in PM.  Follow up BMET on 04/24/17 showed worsening renal function 1.14--> 1.89. Plan was to recheck Monday and admit if continuing to rise or fluid not coming off.   Today he presents to the [facility] ED with  worsening LE weakness and falls. Yesterday he fell while trying to get to the bathroom. He denies syncope or mechanical fall. He reports that his legs just got so weak and gave out on him. He is not very mobile at baseline, but this AM just felt like his legs would not move. He feels like his legs are just "so heavy." Thighs are chafing with some redness. LE wounds are improving with wraps by wound clinic. He continues to have chronic 3 pillow orthopnea but no PND. No dizziness or syncope. No blood in stool or urine. No palpitations.   Past Medical History:  Diagnosis Date  . Abnormality of gait 05/27/2016  . Adenomatous polyps   . Carpal tunnel syndrome 06/25/2016   Right  . Depressive disorder, not elsewhere classified   . First degree AV block    Holter 3/18: NSR, PACs, PVCs, no AFib, no pauses.  [name] [name] Hereditary and idiopathic peripheral neuropathy 06/25/2016  . Hypertension   . Internal nasal lesion 05/15/2013  . Melanoma ([place])    Left Shoulder  . Mitral regurgitation   . MVP (mitral valve prolapse)    a. With severe MR s/p Complex valvuloplasty including artificial Gore-tex neochord placement x4, chordal transposition x1, chordal release x1, # 32 mm Sorin Memo 3D Ring Annuloplasty 2012. // b. Echo 2/18: mild LVH, EF 50-55, mild AI, MV repair with mild MR, mod LAE, mod RVE, severe RAE, severe TR  . Neuropathy   . Normal  coronary arteries    a. Normal coronary anatomy by cath 2012.  . Osteoarthritis    Knees  . PAF (paroxysmal atrial fibrillation) ([place])    a. Post-op MVR 2012.  [name] [name] Personal history of colonic polyps   . Prostate cancer ([place])   . Pulmonary HTN ([place])    a. Mild-mod by cath 2012.  . Pure hypercholesterolemia   . PVC (premature ventricular contraction)   . Thrombocytopenia ([place])   . Vision abnormalities    Cornea scarring     Past Surgical History:  Procedure Laterality Date  . CARDIAC CATHETERIZATION  09/2011   Pre-op for MVR -- normal coronaries.  [name] [name] CARDIOVERSION N/A  01/02/2016   Procedure: CARDIOVERSION;  Surgeon: Thayer Headings, MD;  Location: [facility] ENDOSCOPY;  Service: Cardiovascular;  Laterality: N/A;  . COLONOSCOPY W/ POLYPECTOMY    . INGUINAL HERNIA REPAIR  09/2009   Left  . KNEE ARTHROSCOPY      left x3  and right x2  . Melanoma Surgery     2001, 2005, 2006, 2009  . MITRAL VALVE REPAIR  10/01/2011   complex valvuloplasty with Goretex cord replacement and chordal transposition 67mm Sorin Memo 3D ring annuloplasty  . Nuclear Stress Test  09/2006   EF-64%, Normal  . PROSTATECTOMY  1993  . ROOT CANAL  08-19-12  . ROTATOR CUFF REPAIR  2003   left  . TEE WITHOUT CARDIOVERSION  09/26/2011   Procedure: TRANSESOPHAGEAL ECHOCARDIOGRAM (TEE);  Surgeon: Lelon Perla, MD;  Location[facility] ENDOSCOPY;  Service: Cardiovascular;  Laterality: N/A;  . US ECHOCARDIOGRAPHY  09/2009, 08/1011   mild LVH,mild AI,MVP with mild MR, mild-mod. TR with mild Pulm. HTN, EF-55-60%    No Known Allergies  I have reviewed the patient's current medications  . sodium chloride 75 mL/hr at 04/26/17 1359     Prior to Admission medications   Medication Sig Start Date End Date Taking? Authorizing Provider  amiodarone (PACERONE) 200 MG tablet Take 200 mg by mouth daily.  09/03/16  Yes [provider]  b complex vitamins tablet Take 1 tablet by mouth daily.   Yes [provider]  CARTIA XT 300 MG 24 hr capsule Take 300 mg by mouth daily.  08/05/16  Yes [provider]  cycloSPORINE (RESTASIS) 0.05 % ophthalmic emulsion Place 1 drop into both eyes 2 (two) times daily.   Yes [provider]  gabapentin (NEURONTIN) 300 MG capsule Take 300 mg by mouth 2 (two) times daily.  08/12/16  Yes [provider]  glucosamine-chondroitin 500-400 MG tablet Take 1 tablet by mouth 2 (two) times daily.   Yes [provider]  Multiple Vitamin (MULTIVITAMIN WITH MINERALS) TABS tablet Take 1 tablet by mouth every evening.    Yes [provider]  MYRBETRIQ 50 MG TB24 tablet Take 1 tablet by mouth daily. 01/14/17  Yes [provider]  potassium chloride SA (K-DUR,KLOR-CON) 20 MEQ tablet Take 1 tablet (20 mEq total) by mouth daily. 11/04/16  Yes Weaver, Scott T, PA-C  rosuvastatin (CRESTOR) 5 MG tablet Take 5 mg by mouth daily at 6 PM.  06/23/16  Yes [provider]  TOPROL XL 50 MG 24 hr tablet Take 50 mg by mouth daily.  06/20/16  Yes [provider]  torsemide (DEMADEX) 20 MG tablet Take 40 mg (2 Tablets) in the AM and 20 mg (1 Tablet) in the PM. 04/21/17  Yes Bensimhon, Shaune Pascal, MD  traZODone (DESYREL) 100 MG tablet Take 300 mg by mouth  at bedtime.  10/17/11  Yes Lars Pinks M, PA-C  vitamin C (ASCORBIC ACID) 500 MG tablet Take 500 mg by mouth 2 (two) times daily.   Yes [provider]  warfarin (COUMADIN) 5 MG tablet Take 2.5 mg by mouth daily at 6 PM.    Yes [provider]  furosemide (LASIX) 40 MG tablet Take 1 tablet (40 mg total) by mouth 2 (two) times daily. Patient not taking: Reported on 04/26/2017 02/25/17 05/26/17  [name], MD  warfarin (COUMADIN) 5 MG tablet TAKE 1 TABLET DAILY AS DIRECTED BY THE COUMADIN CLINIC Patient not taking: Reported on 04/26/2017 12/08/16   [name], MD     Social History   Social History  . Marital status: Widowed    Spouse name: N/A  . Number of children: 2  . Years of education: 3   Occupational History  . Not on file.   Social History Main Topics  . Smoking status: Never Smoker  . Smokeless tobacco: Never Used  . Alcohol use No     Comment: Last drink in 2000  . Drug use: No  . Sexual activity: Not on file   Other Topics Concern  . Not on file   Social History Narrative   Retired - Optometrist   Widower   2 children   Drinks 1 cup of coffee per day    Family Status  Relation Status  . Brother Alive  . Mother Deceased at age 18  . Father Deceased at age 27       Post op complications- Leg surgery  .  MGM Deceased  . MGF Deceased  . PGM Deceased  . PGF Deceased  . Neg Hx (Not Specified)   Family History  Problem Relation Age of Onset  . Clotting disorder Brother        CVA's  . Arthritis Mother   . Hypertension Mother   . Stroke Mother   . Hypertension Father   . Psychosis Father        psychiatric care  . Colon cancer Neg Hx   . Stomach cancer Neg Hx   . Heart attack Neg Hx     ROS:  Full 14 point review of systems complete and found to be negative unless listed above.  Physical Exam: Blood pressure 129/72, pulse (!) 56, temperature 97.5 F (36.4 C), temperature source Oral, resp. rate 11, height 6\' 5"  (1.956 m), weight 191 lb (86.6 kg), SpO2 96 %.  General:  Elderly appearing. NAD.  HEENT: Normal Neck: supple. JVP to jaw with prominent CV waves. Carotids 2+ bilat; no bruits. Cor: PMI nondisplaced. Regular rate & rhythm. Soft murmur  Lungs: CTAB, normal effort.  Abdomen: Soft, nontender, ++ distended. No hepatosplenomegaly. No bruits or masses. Good bowel sounds. Extremities: no cyanosis, clubbing, or rash. 2+ edema up into thighs with leg wraps in place. Some mild erythremia on inner aspects of thighs Neuro: alert & oriented x 3, cranial nerves grossly intact. moves all 4 extremities w/o difficulty. Affect pleasant.  Labs:   Lab Results  Component Value Date   WBC 6.9 04/26/2017   HGB 10.2 (L) 04/26/2017   HCT 34.3 (L) 04/26/2017   MCV 85.3 04/26/2017   PLT 64 (L) 04/26/2017    Recent Labs  04/26/17 1347  INR 3.77    Recent Labs Lab 04/26/17 1347  NA 144  K 3.6  CL 104  CO2 33*  BUN 36*  CREATININE 1.91*  CALCIUM 9.0  PROT 7.0  BILITOT 1.9*  ALKPHOS 55  ALT 24  AST 26  GLUCOSE 121*  ALBUMIN 3.4*   Magnesium  Date Value Ref Range Status  04/26/2014 2.1 1.5 - 2.5 mg/dL Final   No results for input(s): CKTOTAL, CKMB, TROPONINI in the last 72 hours. No results for input(s): TROPIPOC in the last 72 hours. Pro B Natriuretic peptide (BNP)    Date/Time Value Ref Range Status  11/19/2011 03:03 PM 370.0 (H) 0.0 - 100.0 pg/mL Final  10/27/2011 05:00 AM 2493.0 (H) 0 - 450 pg/mL Final   Lab Results  Component Value Date   CHOL 112 (L) 11/02/2015   HDL 49 11/02/2015   LDLCALC 50 11/02/2015   TRIG 66 11/02/2015   Lab Results  Component Value Date   DDIMER 0.76 (H) 10/01/2011   Lipase  Date/Time Value Ref Range Status  04/26/2017 01:47 PM 37 11 - 51 U/L Final   TSH  Date/Time Value Ref Range Status  08/25/2016 01:39 PM 0.73 0.40 - 4.50 mIU/L Final   Vitamin B-12  Date/Time Value Ref Range Status  05/27/2016 03:54 PM 842 211 - 946 pg/mL Final    ECG:  No ecg in system yet - personally reviewed  TELE: sinus  - personally reviewed  Radiology:  Dg Shoulder Right  Result Date: 04/26/2017 CLINICAL DATA:  Fall, right shoulder pain EXAM: RIGHT SHOULDER - 2+ VIEW COMPARISON:  None. FINDINGS: No fracture or dislocation is seen. The joint spaces are preserved. Visualized soft tissues are within normal limits. Visualized right lung is clear. IMPRESSION: Negative. Electronically Signed   By: Julian Hy M.D.   On: 04/26/2017 13:01   Ct Head Wo Contrast  Result Date: 04/26/2017 CLINICAL DATA:  Unsteady gait and multiple falls over the past month. The patient most recently fell last night. EXAM: CT HEAD WITHOUT CONTRAST TECHNIQUE: Contiguous axial images were obtained from the base of the skull through the vertex without intravenous contrast. COMPARISON:  Head CT scan 04/03/2014. FINDINGS: Brain: No evidence of acute abnormality including hemorrhage, infarct, mass lesion, mass effect, midline shift or abnormal extra-axial fluid collection. No hydrocephalus or pneumocephalus. Mild atrophy and chronic microvascular ischemic change appear age appropriate. Vascular: Atherosclerosis noted. Skull: Intact. Sinuses/Orbits: Negative. Other: None. IMPRESSION: No acute abnormality. Atherosclerosis. Electronically Signed   By: Inge Rise  M.D.   On: 04/26/2017 14:53   Dg Chest Port 1 View  Result Date: 04/26/2017 CLINICAL DATA:  Left upper chest soreness. Patient reports recent fall. EXAM: PORTABLE CHEST 1 VIEW COMPARISON:  PA and lateral chest 04/11/2016 and 02/07/2016. CT chest 02/14/2016. FINDINGS: The patient is status post mitral valve replacement. There is cardiomegaly without edema. Chronic left basilar opacity is unchanged. The right lung is clear. Aortic atherosclerosis is noted. No acute bony abnormality. IMPRESSION: Cardiomegaly without acute disease. Electronically Signed   By: Inge Rise M.D.   On: 04/26/2017 13:41    ASSESSMENT AND PLAN:     Combined diastolic HF with R>L symptoms: felt to have severe RV failure in setting of longstanding MV disease (likely a combination of WHO Group I and II disease as the pulmonary vascular bed has likely remodeled with longstanding MR). - Echo 12/19/16 with LVEF 50-55%, Mild AI, s/p MVR with mild residual MR, Mod LAE, Mod RV dilation, severe RAE, Severe TR. - NYHA III-IIIb, difficult to assess with limited mobility; can only walk short distances - He remains volume overloaded on exam, but weight has steadily been decreasing. He was 195 lbs in  office on 04/21/17 and now 191lbs. His renal function has worsened from 1.14--> 1.91. Per Dr. Johnsie Cancel, we will stop Torsemide and start IV lasix 80mg  q 8hrs. Advanced CHF will assume care in the AM  AKI: creat 1.14--> 1.91. Continue to monitor   PAF: NSR by exam and tele. Continue coumadin and amiodarone 200mg  daily. CHADSVASC at least 4 (CHF, HTN, age)  Severe TR: likely contributing to CHF. Felt to be a poor surgical candidate and he has been reluctant to pursue invasive procedures. He will likely need a RHC at some point during admission   PAH: felt to be a combination of WHO group 1 and 2 PAH s/p long standing Mitral disease. Will likely need RHC as above.   Thrombocytopenia: PLT 64, but this is not new and has been as low as 26 back  in 2012. Continue to monitor   Leg weakness: CT head negative for acute abnormality. Work up per IM   Signed: Angelena Form, PA-C 04/26/2017 5:15 PM  Pager [phone]  Co-Sign MD  Patient examined chart reviewed. Discussed care with patient , son and PA  End stage right heart failure Exam with frail elderly male At least 25 lbs volume overloaded. JVP markedly elevated with V wave Lungs Clear TR murmur positive HJR. Scortal edema condom cath in place Plus 3 LE edema with erythema and  Mild skin break down and wraps in place. Admit for iv diuresis lasix 80 q 8.  If Cr bumps too high or poor response May need inotrope for RV DB to see in am and consider right heart cath as well  Coumadin would have to be held In NSR now follow Cr and lytes with aggressive diuresis  Jenkins Rouge

## 2017-04-26 NOTE — ED Provider Notes (Signed)
[facility] DEPT Provider Note   CSN: [id number] Arrival date & time: 04/26/17  1139     History   Chief Complaint Chief Complaint  Patient presents with  . Fall  . Wound Check    HPI [name] [name] is a 83 y.o. male.  Patient brought in by EMS from home. Patient with multiple falls. Says he does not pass out. But he falls several times in a week. Patient noted to have bruising all over him. Patient is on the blood thinner Coumadin. Patient's worried that he broke his right arm is where most of his pain is. Also states he has a sore on his bottom area. Patient states he has swelling of his legs. Patient states he lives by himself.      Past Medical History:  Diagnosis Date  . Abnormality of gait 05/27/2016  . Adenomatous polyps   . Carpal tunnel syndrome 06/25/2016   Right  . Depressive disorder, not elsewhere classified   . First degree AV block    Holter 3/18: NSR, PACs, PVCs, no AFib, no pauses.  Marland Kitchen Hereditary and idiopathic peripheral neuropathy 06/25/2016  . Hypertension   . Internal nasal lesion 05/15/2013  . Melanoma ([place])    Left Shoulder  . Mitral regurgitation   . MVP (mitral valve prolapse)    a. With severe MR s/p Complex valvuloplasty including artificial Gore-tex neochord placement x4, chordal transposition x1, chordal release x1, # 32 mm Sorin Memo 3D Ring Annuloplasty 2012. // b. Echo 2/18: mild LVH, EF 50-55, mild AI, MV repair with mild MR, mod LAE, mod RVE, severe RAE, severe TR  . Neuropathy   . Normal coronary arteries    a. Normal coronary anatomy by cath 2012.  . Osteoarthritis    Knees  . PAF (paroxysmal atrial fibrillation) ([place])    a. Post-op MVR 2012.  Marland Kitchen Personal history of colonic polyps   . Prostate cancer ([place])   . Pulmonary HTN ([place])    a. Mild-mod by cath 2012.  . Pure hypercholesterolemia   . PVC (premature ventricular contraction)   . Thrombocytopenia ([place])   . Vision abnormalities    Cornea scarring    Patient Active Problem  List   Diagnosis Date Noted  . Right-sided heart failure ([place]) 04/26/2017  . Chronic diastolic heart failure ([place]) 04/21/2017  . Pulmonary hypertension, primary ([place]) 04/21/2017  . Severe tricuspid regurgitation 03/12/2017  . Bilateral lower extremity edema 02/25/2017  . Anticoagulated 02/25/2017  . Varicose veins of right lower extremity with complications 02/40/9735  . Obstructive lung disease (generalized) ([place]) 09/26/2016  . Carpal tunnel syndrome 06/25/2016  . Hereditary and idiopathic peripheral neuropathy 06/25/2016  . Paresthesia 05/27/2016  . Abnormality of gait 05/27/2016  . Chest x-ray abnormality 01/09/2016  . Paroxysmal atrial flutter ([place]) 12/05/2015  . Constipation 06/13/2015  . Encounter for therapeutic drug monitoring 05/25/2014  . Syncope 04/03/2014  . Rhabdomyolysis 04/03/2014  . UTI (urinary tract infection) 04/03/2014  . Acute renal failure ([place]) 04/03/2014  . Leukocytosis 04/03/2014  . Internal nasal lesion 05/15/2013  . Low back pain 04/19/2013  . Melanoma ([place]) 09/30/2012  . Cough 03/26/2012  . Hx of mitral valve repair 11/19/2011  . Long term (current) use of anticoagulants 11/03/2011  . Decubitus skin ulcer 10/28/2011  . Pleural effusion due to congestive heart failure ([place]) 10/28/2011  . Atrial fibrillation ([place]) 10/07/2011  . S/P mitral valve repair 10/01/2011  . Valvular heart disease 08/21/2011  . Cerumen impaction 04/09/2011  . Hearing loss  04/09/2011  . THROMBOCYTOPENIA 09/19/2010  . ADENOCARCINOMA, PROSTATE 09/17/2010  . CALLUS, LEFT FOOT 09/17/2010  . BACK PAIN, CHRONIC 09/17/2010  . TINEA PEDIS 05/28/2009  . DERMATITIS, ATOPIC 04/10/2009  . HYPERCHOLESTEROLEMIA 06/09/2008  . DEPRESSION 06/09/2008  . HYPERTENSION, BENIGN ESSENTIAL 06/09/2008  . GERD 06/09/2008  . OSTEOARTHRITIS, GENERALIZED, MULTIPLE JOINTS 06/09/2008  . MUSCLE SPASM, BACK 06/09/2008  . PERSONAL HISTORY MALIGNANT NEOPLASM PROSTATE 06/09/2008  . PERSONAL HISTORY OF  MALIGNANT MELANOMA OF SKIN 06/09/2008  . ARRHYTHMIA, HX OF 06/09/2008  . Personal history of colonic polyps 06/09/2008    Past Surgical History:  Procedure Laterality Date  . CARDIAC CATHETERIZATION  09/2011   Pre-op for MVR -- normal coronaries.  Marland Kitchen CARDIOVERSION N/A 01/02/2016   Procedure: CARDIOVERSION;  Surgeon: Thayer Headings, MD;  Location: [facility] ENDOSCOPY;  Service: Cardiovascular;  Laterality: N/A;  . COLONOSCOPY W/ POLYPECTOMY    . INGUINAL HERNIA REPAIR  09/2009   Left  . KNEE ARTHROSCOPY      left x3  and right x2  . Melanoma Surgery     2001, 2005, 2006, 2009  . MITRAL VALVE REPAIR  10/01/2011   complex valvuloplasty with Goretex cord replacement and chordal transposition 47mm Sorin Memo 3D ring annuloplasty  . Nuclear Stress Test  09/2006   EF-64%, Normal  . PROSTATECTOMY  1993  . ROOT CANAL  08-19-12  . ROTATOR CUFF REPAIR  2003   left  . TEE WITHOUT CARDIOVERSION  09/26/2011   Procedure: TRANSESOPHAGEAL ECHOCARDIOGRAM (TEE);  Surgeon: Lelon Perla, MD;  Location: [facility] ENDOSCOPY;  Service: Cardiovascular;  Laterality: N/A;  . US ECHOCARDIOGRAPHY  09/2009, 08/1011   mild LVH,mild AI,MVP with mild MR, mild-mod. TR with mild Pulm. HTN, EF-55-60%       Home Medications    Prior to Admission medications   Medication Sig Start Date End Date Taking? Authorizing Provider  amiodarone (PACERONE) 200 MG tablet Take 200 mg by mouth daily.  09/03/16  Yes [provider]  b complex vitamins tablet Take 1 tablet by mouth daily.   Yes [provider]  CARTIA XT 300 MG 24 hr capsule Take 300 mg by mouth daily.  08/05/16  Yes [provider]  cycloSPORINE (RESTASIS) 0.05 % ophthalmic emulsion Place 1 drop into both eyes 2 (two) times daily.   Yes [provider]  gabapentin (NEURONTIN) 300 MG capsule Take 300 mg by mouth 2 (two) times daily.  08/12/16  Yes [provider]  glucosamine-chondroitin 500-400 MG tablet Take 1 tablet by mouth  2 (two) times daily.   Yes [provider]  Multiple Vitamin (MULTIVITAMIN WITH MINERALS) TABS tablet Take 1 tablet by mouth every evening.    Yes [provider]  MYRBETRIQ 50 MG TB24 tablet Take 1 tablet by mouth daily. 01/14/17  Yes [provider]  potassium chloride SA (K-DUR,KLOR-CON) 20 MEQ tablet Take 1 tablet (20 mEq total) by mouth daily. 11/04/16  Yes Weaver, Shakyia Bosso T, PA-C  rosuvastatin (CRESTOR) 5 MG tablet Take 5 mg by mouth daily at 6 PM.  06/23/16  Yes [provider]  TOPROL XL 50 MG 24 hr tablet Take 50 mg by mouth daily.  06/20/16  Yes [provider]  torsemide (DEMADEX) 20 MG tablet Take 40 mg (2 Tablets) in the AM and 20 mg (1 Tablet) in the PM. 04/21/17  Yes Bensimhon, Shaune Pascal, MD  traZODone (DESYREL) 100 MG tablet Take 300 mg by mouth at bedtime.  10/17/11  Yes Lars Pinks  M, PA-C  vitamin C (ASCORBIC ACID) 500 MG tablet Take 500 mg by mouth 2 (two) times daily.   Yes [provider]  warfarin (COUMADIN) 5 MG tablet Take 2.5 mg by mouth daily at 6 PM.    Yes [provider]  furosemide (LASIX) 40 MG tablet Take 1 tablet (40 mg total) by mouth 2 (two) times daily. Patient not taking: Reported on 04/26/2017 02/25/17 05/26/17  [name], MD  warfarin (COUMADIN) 5 MG tablet TAKE 1 TABLET DAILY AS DIRECTED BY THE COUMADIN CLINIC Patient not taking: Reported on 04/26/2017 12/08/16   [name], MD    Family History Family History  Problem Relation Age of Onset  . Clotting disorder Brother        CVA's  . Arthritis Mother   . Hypertension Mother   . Stroke Mother   . Hypertension Father   . Psychosis Father        psychiatric care  . Colon cancer Neg Hx   . Stomach cancer Neg Hx   . Heart attack Neg Hx     Social History Social History  Substance Use Topics  . Smoking status: Never Smoker  . Smokeless tobacco: Never Used  . Alcohol use No     Comment: Last drink in 2000      Allergies   Patient has no known allergies.   Review of Systems Review of Systems  Constitutional: Negative for fever.  HENT: Negative for congestion.   Eyes: Negative for visual disturbance.  Respiratory: Negative for shortness of breath.   Cardiovascular: Positive for leg swelling. Negative for chest pain.  Gastrointestinal: Negative for abdominal pain.  Genitourinary: Negative for dysuria.  Musculoskeletal: Positive for back pain.  Skin: Positive for wound.  Neurological: Negative for headaches.  Hematological: Bruises/bleeds easily.  Psychiatric/Behavioral: Positive for confusion.     Physical Exam Updated Vital Signs BP 129/72   Pulse (!) 56   Temp 97.5 F (36.4 C) (Oral)   Resp 11   Ht 1.956 m (6\' 5" )   Wt 86.6 kg (191 lb)   SpO2 96%   BMI 22.65 kg/m   Physical Exam  Constitutional: He appears well-developed and well-nourished. No distress.  HENT:  Head: Normocephalic and atraumatic.  Mouth/Throat: Oropharynx is clear and moist.  Eyes: Conjunctivae and EOM are normal. Pupils are equal, round, and reactive to light.  Neck: Normal range of motion. Neck supple.  Cardiovascular: Normal rate.   Pulmonary/Chest: Effort normal and breath sounds normal. No respiratory distress.  Abdominal: Soft. Bowel sounds are normal. There is no tenderness.  Musculoskeletal: He exhibits edema.  Scattered bruising throughout all extremities. Marked lower extremity edema up into the thigh area.  Neurological: He is alert. No cranial nerve deficit or sensory deficit. He exhibits normal muscle tone. Coordination normal.  Nursing note and vitals reviewed.    ED Treatments / Results  Labs (all labs ordered are listed, but only abnormal results are displayed) Labs Reviewed  PROTIME-INR - Abnormal; Notable for the following:       Result Value   Prothrombin Time 38.2 (*)    All other components within normal limits  COMPREHENSIVE METABOLIC PANEL - Abnormal; Notable for the  following:    CO2 33 (*)    Glucose, Bld 121 (*)    BUN 36 (*)    Creatinine, Ser 1.91 (*)    Albumin 3.4 (*)    Total Bilirubin 1.9 (*)    GFR calc non Af Wyvonnia Lora  31 (*)    GFR calc Af Amer 35 (*)    All other components within normal limits  CBC WITH DIFFERENTIAL/PLATELET - Abnormal; Notable for the following:    RBC 4.02 (*)    Hemoglobin 10.2 (*)    HCT 34.3 (*)    MCH 25.4 (*)    MCHC 29.7 (*)    RDW 16.8 (*)    Platelets 64 (*)    Monocytes Absolute 2.0 (*)    All other components within normal limits  LIPASE, BLOOD  URINALYSIS, ROUTINE W REFLEX MICROSCOPIC    EKG  EKG Interpretation None       Radiology Dg Shoulder Right  Result Date: 04/26/2017 CLINICAL DATA:  Fall, right shoulder pain EXAM: RIGHT SHOULDER - 2+ VIEW COMPARISON:  None. FINDINGS: No fracture or dislocation is seen. The joint spaces are preserved. Visualized soft tissues are within normal limits. Visualized right lung is clear. IMPRESSION: Negative. Electronically Signed   By: Julian Hy M.D.   On: 04/26/2017 13:01   Ct Head Wo Contrast  Result Date: 04/26/2017 CLINICAL DATA:  Unsteady gait and multiple falls over the past month. The patient most recently fell last night. EXAM: CT HEAD WITHOUT CONTRAST TECHNIQUE: Contiguous axial images were obtained from the base of the skull through the vertex without intravenous contrast. COMPARISON:  Head CT scan 04/03/2014. FINDINGS: Brain: No evidence of acute abnormality including hemorrhage, infarct, mass lesion, mass effect, midline shift or abnormal extra-axial fluid collection. No hydrocephalus or pneumocephalus. Mild atrophy and chronic microvascular ischemic change appear age appropriate. Vascular: Atherosclerosis noted. Skull: Intact. Sinuses/Orbits: Negative. Other: None. IMPRESSION: No acute abnormality. Atherosclerosis. Electronically Signed   By: Inge Rise M.D.   On: 04/26/2017 14:53   Dg Chest Port 1 View  Result Date: 04/26/2017 CLINICAL  DATA:  Left upper chest soreness. Patient reports recent fall. EXAM: PORTABLE CHEST 1 VIEW COMPARISON:  PA and lateral chest 04/11/2016 and 02/07/2016. CT chest 02/14/2016. FINDINGS: The patient is status post mitral valve replacement. There is cardiomegaly without edema. Chronic left basilar opacity is unchanged. The right lung is clear. Aortic atherosclerosis is noted. No acute bony abnormality. IMPRESSION: Cardiomegaly without acute disease. Electronically Signed   By: Inge Rise M.D.   On: 04/26/2017 13:41    Procedures Procedures (including critical care time)  Medications Ordered in ED Medications  0.9 %  sodium chloride infusion ( Intravenous New Bag/Given 04/26/17 1359)  sodium chloride 0.9 % bolus 250 mL (0 mLs Intravenous Stopped 04/26/17 1649)     Initial Impression / Assessment and Plan / ED Course  I have reviewed the triage vital signs and the nursing notes.  Pertinent labs & imaging results that were available during my care of the patient were reviewed by me and considered in my medical decision making (see chart for details).     Patient with a history of multiple falls at home. Patient known to have severe right-sided heart failure. Followed by Dr. Ronna Polio.  Patient with a fall today was worried about injuring his right arm and the shoulder area also worried about hitting his head. And has an blood blister on his buttocks and low back area.   Workup after talking to the patient's daughters pre-clear that he is not able to care for himself at home. He also has some memory issues and tends to forget most of the falls. Patient is also on Coumadin.  Head CT was negative chest x-ray negative x-ray of his right arm without  any bony injuries.  However daughter stated that cardiology wanted to admit him on Wednesday to diuresis fluid. Discuss with Dr. Harrington Challenger on call today. She reviewed Dr. Suezanne Jacquet Simon's notes. And agreed that they could support admission for diuresis. They  will consult. Hospitalist will admit. Patient probably also will need movement towards nursing home placement. Patient's Coumadin INR elevated here today will need to have that held.  Final Clinical Impressions(s) / ED Diagnoses   Final diagnoses:  Chronic right-sided heart failure ([place])  Fall, initial encounter  Abrasion    New Prescriptions New Prescriptions   No medications on file     Fredia Sorrow, MD 04/26/17 [phone]

## 2017-04-26 NOTE — H&P (Signed)
History and Physical    [name] [name] GHW:[id number] DOB: Sep 06, 1931 DOA: 04/26/2017  PCP: [name], MD  Patient coming from: Home  Chief Complaint: frequent falls and worsening edema  HPI: [name] [name] is a 83 y.o. male with medical history significant of diastolic heart failure, PAF/atrial flutter on Coumadin, and history of severe MV prolapse status post repair. The patient reports that he has been falling daily. He is on Coumadin at home which is concerning he even hit his head. He denies any syncope or fevers. The problem has been persistent and nothing he is aware of makes it better or worse. He has also been complaining of edema which has been an ongoing problem for the last week. The problem has been getting worse. As by him taking his diuretics he reports he has been having an increase in edema. Of note patient has been known to be noncompliant with diuretic regimen per cardiology notes.  ED Course: CT scan of head was negative and after evaluation we were consulted for further medical evaluation recommendations. ER physician consulted cardiologist  Review of Systems: As per HPI otherwise 10 point review of systems negative.    Past Medical History:  Diagnosis Date  . Abnormality of gait 05/27/2016  . Adenomatous polyps   . Carpal tunnel syndrome 06/25/2016   Right  . Depressive disorder, not elsewhere classified   . First degree AV block    Holter 3/18: NSR, PACs, PVCs, no AFib, no pauses.  Marland Kitchen Hereditary and idiopathic peripheral neuropathy 06/25/2016  . Hypertension   . Internal nasal lesion 05/15/2013  . Melanoma ([place])    Left Shoulder  . Mitral regurgitation   . MVP (mitral valve prolapse)    a. With severe MR s/p Complex valvuloplasty including artificial Gore-tex neochord placement x4, chordal transposition x1, chordal release x1, # 32 mm Sorin Memo 3D Ring Annuloplasty 2012. // b. Echo 2/18: mild LVH, EF 50-55, mild AI, MV repair with mild MR, mod LAE, mod RVE,  severe RAE, severe TR  . Neuropathy   . Normal coronary arteries    a. Normal coronary anatomy by cath 2012.  . Osteoarthritis    Knees  . PAF (paroxysmal atrial fibrillation) ([place])    a. Post-op MVR 2012.  Marland Kitchen Personal history of colonic polyps   . Prostate cancer ([place])   . Pulmonary HTN ([place])    a. Mild-mod by cath 2012.  . Pure hypercholesterolemia   . PVC (premature ventricular contraction)   . Thrombocytopenia ([place])   . Vision abnormalities    Cornea scarring    Past Surgical History:  Procedure Laterality Date  . CARDIAC CATHETERIZATION  09/2011   Pre-op for MVR -- normal coronaries.  Marland Kitchen CARDIOVERSION N/A 01/02/2016   Procedure: CARDIOVERSION;  Surgeon: Thayer Headings, MD;  Location: [facility] ENDOSCOPY;  Service: Cardiovascular;  Laterality: N/A;  . COLONOSCOPY W/ POLYPECTOMY    . INGUINAL HERNIA REPAIR  09/2009   Left  . KNEE ARTHROSCOPY      left x3  and right x2  . Melanoma Surgery     2001, 2005, 2006, 2009  . MITRAL VALVE REPAIR  10/01/2011   complex valvuloplasty with Goretex cord replacement and chordal transposition 79mm Sorin Memo 3D ring annuloplasty  . Nuclear Stress Test  09/2006   EF-64%, Normal  . PROSTATECTOMY  1993  . ROOT CANAL  08-19-12  . ROTATOR CUFF REPAIR  2003   left  . TEE WITHOUT CARDIOVERSION  09/26/2011  Procedure: TRANSESOPHAGEAL ECHOCARDIOGRAM (TEE);  Surgeon: Lelon Perla, MD;  Location: [facility] ENDOSCOPY;  Service: Cardiovascular;  Laterality: N/A;  . US ECHOCARDIOGRAPHY  09/2009, 08/1011   mild LVH,mild AI,MVP with mild MR, mild-mod. TR with mild Pulm. HTN, EF-55-60%     reports that he has never smoked. He has never used smokeless tobacco. He reports that he does not drink alcohol or use drugs.  No Known Allergies  Family History  Problem Relation Age of Onset  . Clotting disorder Brother        CVA's  . Arthritis Mother   . Hypertension Mother   . Stroke Mother   . Hypertension Father   . Psychosis Father        psychiatric care   . Colon cancer Neg Hx   . Stomach cancer Neg Hx   . Heart attack Neg Hx     Prior to Admission medications   Medication Sig Start Date End Date Taking? Authorizing Provider  amiodarone (PACERONE) 200 MG tablet Take 200 mg by mouth daily.  09/03/16  Yes [provider]  b complex vitamins tablet Take 1 tablet by mouth daily.   Yes [provider]  CARTIA XT 300 MG 24 hr capsule Take 300 mg by mouth daily.  08/05/16  Yes [provider]  cycloSPORINE (RESTASIS) 0.05 % ophthalmic emulsion Place 1 drop into both eyes 2 (two) times daily.   Yes [provider]  gabapentin (NEURONTIN) 300 MG capsule Take 300 mg by mouth 2 (two) times daily.  08/12/16  Yes [provider]  glucosamine-chondroitin 500-400 MG tablet Take 1 tablet by mouth 2 (two) times daily.   Yes [provider]  Multiple Vitamin (MULTIVITAMIN WITH MINERALS) TABS tablet Take 1 tablet by mouth every evening.    Yes [provider]  MYRBETRIQ 50 MG TB24 tablet Take 1 tablet by mouth daily. 01/14/17  Yes [provider]  potassium chloride SA (K-DUR,KLOR-CON) 20 MEQ tablet Take 1 tablet (20 mEq total) by mouth daily. 11/04/16  Yes Weaver, Scott T, PA-C  rosuvastatin (CRESTOR) 5 MG tablet Take 5 mg by mouth daily at 6 PM.  06/23/16  Yes [provider]  TOPROL XL 50 MG 24 hr tablet Take 50 mg by mouth daily.  06/20/16  Yes [provider]  torsemide (DEMADEX) 20 MG tablet Take 40 mg (2 Tablets) in the AM and 20 mg (1 Tablet) in the PM. 04/21/17  Yes Bensimhon, Shaune Pascal, MD  traZODone (DESYREL) 100 MG tablet Take 300 mg by mouth at bedtime.  10/17/11  Yes Lars Pinks M, PA-C  vitamin C (ASCORBIC ACID) 500 MG tablet Take 500 mg by mouth 2 (two) times daily.   Yes [provider]  warfarin (COUMADIN) 5 MG tablet Take 2.5 mg by mouth daily at 6 PM.    Yes [provider]  furosemide (LASIX) 40 MG tablet Take 1 tablet (40 mg total) by  mouth 2 (two) times daily. Patient not taking: Reported on 04/26/2017 02/25/17 05/26/17  [name], MD  warfarin (COUMADIN) 5 MG tablet TAKE 1 TABLET DAILY AS DIRECTED BY THE COUMADIN CLINIC Patient not taking: Reported on 04/26/2017 12/08/16   [name], MD    Physical Exam: Vitals:   04/26/17 1500 04/26/17 1530 04/26/17 1600 04/26/17 1630  BP: 133/73 128/74 (!) 107/53 129/72  Pulse: (!) 57 (!) 57 (!) 56 (!) 56  Resp: 14 15 15 11   Temp:      TempSrc:  SpO2: 95% 93% 91% 96%  Weight:      Height:        Constitutional: NAD, calm, comfortable Vitals:   04/26/17 1500 04/26/17 1530 04/26/17 1600 04/26/17 1630  BP: 133/73 128/74 (!) 107/53 129/72  Pulse: (!) 57 (!) 57 (!) 56 (!) 56  Resp: 14 15 15 11   Temp:      TempSrc:      SpO2: 95% 93% 91% 96%  Weight:      Height:       Eyes: PERRL, lids and conjunctivae normal ENMT: Mucous membranes are moist. Posterior pharynx clear of any exudate or lesions.Normal dentition.  Neck: normal, supple, no masses, no thyromegaly Respiratory: clear to auscultation bilaterally, no wheezing, no crackles.  Cardiovascular: Irregular rate and rhythm, no rubs, positive murmur Abdomen: no tenderness, no masses palpated. No hepatosplenomegaly. Bowel sounds positive.  Musculoskeletal: no clubbing / cyanosis. No joint deformity upper and lower extremities.  Skin: The patient has multiple bruises. No induration Neurologic: CN 2-12 grossly intact. Sensation intact, DTR normal. Strength 5/5 in all 4.  Psychiatric: Mood and affect appropriate  Labs on Admission: I have personally reviewed following labs and imaging studies  CBC:  Recent Labs Lab 04/26/17 1347  WBC 6.9  NEUTROABS 3.9  HGB 10.2*  HCT 34.3*  MCV 85.3  PLT 64*   Basic Metabolic Panel:  Recent Labs Lab 04/24/17 1036 04/26/17 1347  NA 143 144  K 3.5 3.6  CL 103 104  CO2 32 33*  GLUCOSE 149* 121*  BUN 36* 36*  CREATININE 1.89* 1.91*  CALCIUM 9.1 9.0    GFR: Estimated Creatinine Clearance: 35.3 mL/min (A) (by C-G formula based on SCr of 1.91 mg/dL (H)). Liver Function Tests:  Recent Labs Lab 04/26/17 1347  AST 26  ALT 24  ALKPHOS 55  BILITOT 1.9*  PROT 7.0  ALBUMIN 3.4*    Recent Labs Lab 04/26/17 1347  LIPASE 37   No results for input(s): AMMONIA in the last 168 hours. Coagulation Profile:  Recent Labs Lab 04/26/17 1347  INR 3.77   Cardiac Enzymes: No results for input(s): CKTOTAL, CKMB, CKMBINDEX, TROPONINI in the last 168 hours. BNP (last 3 results) No results for input(s): PROBNP in the last 8760 hours. HbA1C: No results for input(s): HGBA1C in the last 72 hours. CBG: No results for input(s): GLUCAP in the last 168 hours. Lipid Profile: No results for input(s): CHOL, HDL, LDLCALC, TRIG, CHOLHDL, LDLDIRECT in the last 72 hours. Thyroid Function Tests: No results for input(s): TSH, T4TOTAL, FREET4, T3FREE, THYROIDAB in the last 72 hours. Anemia Panel: No results for input(s): VITAMINB12, FOLATE, FERRITIN, TIBC, IRON, RETICCTPCT in the last 72 hours. Urine analysis:    Component Value Date/Time   COLORURINE AMBER (A) 04/02/2014 2339   APPEARANCEUR TURBID (A) 04/02/2014 2339   LABSPEC 1.021 04/02/2014 2339   PHURINE 5.0 04/02/2014 2339   GLUCOSEU NEGATIVE 04/02/2014 2339   HGBUR LARGE (A) 04/02/2014 2339   BILIRUBINUR NEGATIVE 04/02/2014 2339   KETONESUR 15 (A) 04/02/2014 2339   PROTEINUR 100 (A) 04/02/2014 2339   UROBILINOGEN 1.0 04/02/2014 2339   NITRITE POSITIVE (A) 04/02/2014 2339   LEUKOCYTESUR LARGE (A) 04/02/2014 2339    Radiological Exams on Admission: Dg Shoulder Right  Result Date: 04/26/2017 CLINICAL DATA:  Fall, right shoulder pain EXAM: RIGHT SHOULDER - 2+ VIEW COMPARISON:  None. FINDINGS: No fracture or dislocation is seen. The joint spaces are preserved. Visualized soft tissues are within normal limits. Visualized right lung is clear.  IMPRESSION: Negative. Electronically Signed   By:  Julian Hy M.D.   On: 04/26/2017 13:01   Ct Head Wo Contrast  Result Date: 04/26/2017 CLINICAL DATA:  Unsteady gait and multiple falls over the past month. The patient most recently fell last night. EXAM: CT HEAD WITHOUT CONTRAST TECHNIQUE: Contiguous axial images were obtained from the base of the skull through the vertex without intravenous contrast. COMPARISON:  Head CT scan 04/03/2014. FINDINGS: Brain: No evidence of acute abnormality including hemorrhage, infarct, mass lesion, mass effect, midline shift or abnormal extra-axial fluid collection. No hydrocephalus or pneumocephalus. Mild atrophy and chronic microvascular ischemic change appear age appropriate. Vascular: Atherosclerosis noted. Skull: Intact. Sinuses/Orbits: Negative. Other: None. IMPRESSION: No acute abnormality. Atherosclerosis. Electronically Signed   By: Inge Rise M.D.   On: 04/26/2017 14:53   Dg Chest Port 1 View  Result Date: 04/26/2017 CLINICAL DATA:  Left upper chest soreness. Patient reports recent fall. EXAM: PORTABLE CHEST 1 VIEW COMPARISON:  PA and lateral chest 04/11/2016 and 02/07/2016. CT chest 02/14/2016. FINDINGS: The patient is status post mitral valve replacement. There is cardiomegaly without edema. Chronic left basilar opacity is unchanged. The right lung is clear. Aortic atherosclerosis is noted. No acute bony abnormality. IMPRESSION: Cardiomegaly without acute disease. Electronically Signed   By: Inge Rise M.D.   On: 04/26/2017 13:41    Assessment/Plan Active Problems:   Right-sided heart failure [facility]) - Cardiology consulted. Will defer diuresis recommendations to them as well as further cardiac imaging tests - placed on lasix 80 mg q 8 hours    Atrial fibrillation (HCC) - continue rate controlling medications - hold coumadin given elevated INR levels    GERD - stable currently  Neuropathy - pt on Gabapentin will continue  ARF - Baseline arount 1.1. Currently at 1.9 not sure if  this represents a new baseline for patient given that he is on diuretics for principle problem listed above  Hypercholesterolemia - continue Crestor   DVT prophylaxis: on coumadin Code Status: Full Family Communication: d/c patient and son at bedside Disposition Plan: Patient will get evaluated by PT to be determined Consults called: Cardiology by ER physician Admission status: inpatient   Velvet Bathe MD [facility] Pager [phone]  If 7PM-7AM, please contact night-coverage [email or web address] Password [facility]  04/26/2017, 5:38 PM

## 2017-04-27 DIAGNOSIS — I50810 Right heart failure, unspecified: Secondary | ICD-10-CM

## 2017-04-27 DIAGNOSIS — Z9889 Other specified postprocedural states: Secondary | ICD-10-CM

## 2017-04-27 DIAGNOSIS — K766 Portal hypertension: Secondary | ICD-10-CM

## 2017-04-27 DIAGNOSIS — I2721 Secondary pulmonary arterial hypertension: Secondary | ICD-10-CM

## 2017-04-27 DIAGNOSIS — T148XXA Other injury of unspecified body region, initial encounter: Secondary | ICD-10-CM

## 2017-04-27 DIAGNOSIS — I5033 Acute on chronic diastolic (congestive) heart failure: Secondary | ICD-10-CM

## 2017-04-27 LAB — BASIC METABOLIC PANEL
ANION GAP: 8 (ref 5–15)
BUN: 29 mg/dL — ABNORMAL HIGH (ref 6–20)
CALCIUM: 8.5 mg/dL — AB (ref 8.9–10.3)
CO2: 34 mmol/L — ABNORMAL HIGH (ref 22–32)
Chloride: 104 mmol/L (ref 101–111)
Creatinine, Ser: 1.55 mg/dL — ABNORMAL HIGH (ref 0.61–1.24)
GFR calc Af Amer: 46 mL/min — ABNORMAL LOW (ref >60)
GFR, EST NON AFRICAN AMERICAN: 39 mL/min — AB (ref >60)
GLUCOSE: 110 mg/dL — AB (ref 65–99)
Potassium: 3.1 mmol/L — ABNORMAL LOW (ref 3.5–5.1)
SODIUM: 146 mmol/L — AB (ref 135–145)

## 2017-04-27 LAB — PROTIME-INR
INR: 3.92
Prothrombin Time: 39.4 seconds — ABNORMAL HIGH (ref 11.4–15.2)

## 2017-04-27 MED ORDER — POLYETHYLENE GLYCOL 3350 17 G PO PACK
17.0000 g | PACK | Freq: Every day | ORAL | Status: DC | PRN
  Filled 2017-04-27: qty 1

## 2017-04-27 MED ORDER — POTASSIUM CHLORIDE CRYS ER 20 MEQ PO TBCR
40.0000 meq | EXTENDED_RELEASE_TABLET | Freq: Two times a day (BID) | ORAL | Status: DC
  Administered 2017-04-27 – 2017-04-28 (×3): 40 meq via ORAL
  Filled 2017-04-27 (×4): qty 2

## 2017-04-27 MED ORDER — METOLAZONE 2.5 MG PO TABS
2.5000 mg | ORAL_TABLET | Freq: Once | ORAL | Status: AC
  Administered 2017-04-27: 2.5 mg via ORAL
  Filled 2017-04-27: qty 1

## 2017-04-27 NOTE — Progress Notes (Signed)
PROGRESS NOTE  [name] [name]  RUE:[id number] DOB: 11-Oct-1931 DOA: 04/26/2017 PCP: [name], MD Outpatient Specialists:  Subjective: Still complaining about severe weakness needed help to sit on the bed. Singing and lower extremity swelling  Brief Narrative:  [name] [name] is a 83 y.o. male with medical history significant of diastolic heart failure, PAF/atrial flutter on Coumadin, and history of severe MV prolapse status post repair. The patient reports that he has been falling daily. He is on Coumadin at home which is concerning he even hit his head. He denies any syncope or fevers. The problem has been persistent and nothing he is aware of makes it better or worse. He has also been complaining of edema which has been an ongoing problem for the last week. The problem has been getting worse. As by him taking his diuretics he reports he has been having an increase in edema. Of note patient has been known to be noncompliant with diuretic regimen per cardiology notes.  Assessment & Plan:   Active Problems:   GERD   Atrial fibrillation (HCC)   Chronic diastolic heart failure (HCC)   Right-sided heart failure (HCC)   Right-sided heart failure (HCC) -Massive lower extremity edema, has Ace wraps around his legs. -Cardiology consulted, started on Lasix 80 mg 3 times a day, metolazone 2.5 mg given today. -Plans for RHC noted per cardiology note.  Atrial fibrillation (HCC) -Rate is controlled, continue amiodarone and potassium. CHA2DS2-VASc is 4. -INR is supratherapeutic at 3.77, hold Coumadin.  GERD - stable currently  Neuropathy - pt on Gabapentin will continue  ARF -Baseline around 1.1, admitted with creatinine 1.9. -Started diuresis currently creatinine 1.5.  Hypokalemia Potassium of 3.1, likely secondary to diuresis will treat with oral supplements.  Hypercholesterolemia - continue Crestor  Thrombocytopenia -This is chronic, no bleeding, has multiple bruises  in his body. -The bruising is secondary to frequent, from cytopenia and being on anticoagulation.  Generalized weakness and frequent falls -Patient has muscle wasting, very heavy lower extremities secondary to edema and generalized weakness. -PT to evaluate, patient feels unsafe to go back home.   DVT prophylaxis:  Code Status: Full Code Family Communication:  Disposition Plan:  Diet: Diet heart healthy/carb modified Room service appropriate? Yes; Fluid consistency: Thin  Consultants:   Cards  Procedures:   None  Antimicrobials:   None   Objective: Vitals:   04/26/17 1938 04/27/17 0013 04/27/17 0439 04/27/17 0500  BP: 117/75 118/73 116/67   Pulse: (!) 54 (!) 53 60   Resp: 16 18 18    Temp: 97.8 F (36.6 C) 97.7 F (36.5 C) 98.2 F (36.8 C)   TempSrc: Oral Oral Oral   SpO2: 94% 93% 97%   Weight: 86.6 kg (191 lb) 90.7 kg (200 lb)  86.6 kg (191 lb)  Height: 6\' 4"  (1.93 m)       Intake/Output Summary (Last 24 hours) at 04/27/17 1107 Last data filed at 04/27/17 0907  Gross per 24 hour  Intake              720 ml  Output             4090 ml  Net            -3370 ml   Filed Weights   04/26/17 1938 04/27/17 0013 04/27/17 0500  Weight: 86.6 kg (191 lb) 90.7 kg (200 lb) 86.6 kg (191 lb)    Examination: General exam: Appears calm and comfortable  Respiratory system: Clear to auscultation.  Respiratory effort normal. Cardiovascular system: S1 & S2 heard, RRR. No JVD, murmurs, rubs, gallops or clicks. No pedal edema. Gastrointestinal system: Abdomen is nondistended, soft and nontender. No organomegaly or masses felt. Normal bowel sounds heard. Central nervous system: Alert and oriented. No focal neurological deficits. Extremities: Symmetric 5 x 5 power. Skin: No rashes, lesions or ulcers Psychiatry: Judgement and insight appear normal. Mood & affect appropriate.   Data Reviewed: I have personally reviewed following labs and imaging studies  CBC:  Recent Labs Lab  04/26/17 1347  WBC 6.9  NEUTROABS 3.9  HGB 10.2*  HCT 34.3*  MCV 85.3  PLT 64*   Basic Metabolic Panel:  Recent Labs Lab 04/24/17 1036 04/26/17 1347 04/27/17 0255  NA 143 144 146*  K 3.5 3.6 3.1*  CL 103 104 104  CO2 32 33* 34*  GLUCOSE 149* 121* 110*  BUN 36* 36* 29*  CREATININE 1.89* 1.91* 1.55*  CALCIUM 9.1 9.0 8.5*   GFR: Estimated Creatinine Clearance: 43.5 mL/min (A) (by C-G formula based on SCr of 1.55 mg/dL (H)). Liver Function Tests:  Recent Labs Lab 04/26/17 1347  AST 26  ALT 24  ALKPHOS 55  BILITOT 1.9*  PROT 7.0  ALBUMIN 3.4*    Recent Labs Lab 04/26/17 1347  LIPASE 37   No results for input(s): AMMONIA in the last 168 hours. Coagulation Profile:  Recent Labs Lab 04/26/17 1347  INR 3.77   Cardiac Enzymes: No results for input(s): CKTOTAL, CKMB, CKMBINDEX, TROPONINI in the last 168 hours. BNP (last 3 results) No results for input(s): PROBNP in the last 8760 hours. HbA1C: No results for input(s): HGBA1C in the last 72 hours. CBG: No results for input(s): GLUCAP in the last 168 hours. Lipid Profile: No results for input(s): CHOL, HDL, LDLCALC, TRIG, CHOLHDL, LDLDIRECT in the last 72 hours. Thyroid Function Tests: No results for input(s): TSH, T4TOTAL, FREET4, T3FREE, THYROIDAB in the last 72 hours. Anemia Panel: No results for input(s): VITAMINB12, FOLATE, FERRITIN, TIBC, IRON, RETICCTPCT in the last 72 hours. Urine analysis:    Component Value Date/Time   COLORURINE YELLOW 04/26/2017 2259   APPEARANCEUR HAZY (A) 04/26/2017 2259   LABSPEC 1.009 04/26/2017 2259   PHURINE 8.0 04/26/2017 2259   GLUCOSEU NEGATIVE 04/26/2017 2259   HGBUR NEGATIVE 04/26/2017 2259   BILIRUBINUR NEGATIVE 04/26/2017 2259   KETONESUR NEGATIVE 04/26/2017 2259   PROTEINUR NEGATIVE 04/26/2017 2259   UROBILINOGEN 1.0 04/02/2014 2339   NITRITE NEGATIVE 04/26/2017 2259   LEUKOCYTESUR NEGATIVE 04/26/2017 2259   Sepsis  Labs: @LABRCNTIP (procalcitonin:4,lacticidven:4)  )No results found for this or any previous visit (from the past 240 hour(s)).   Invalid input(s): PROCALCITONIN, LACTICACIDVEN   Radiology Studies: Dg Shoulder Right  Result Date: 04/26/2017 CLINICAL DATA:  Fall, right shoulder pain EXAM: RIGHT SHOULDER - 2+ VIEW COMPARISON:  None. FINDINGS: No fracture or dislocation is seen. The joint spaces are preserved. Visualized soft tissues are within normal limits. Visualized right lung is clear. IMPRESSION: Negative. Electronically Signed   By: Julian Hy M.D.   On: 04/26/2017 13:01   Ct Head Wo Contrast  Result Date: 04/26/2017 CLINICAL DATA:  Unsteady gait and multiple falls over the past month. The patient most recently fell last night. EXAM: CT HEAD WITHOUT CONTRAST TECHNIQUE: Contiguous axial images were obtained from the base of the skull through the vertex without intravenous contrast. COMPARISON:  Head CT scan 04/03/2014. FINDINGS: Brain: No evidence of acute abnormality including hemorrhage, infarct, mass lesion, mass effect, midline shift or abnormal extra-axial  fluid collection. No hydrocephalus or pneumocephalus. Mild atrophy and chronic microvascular ischemic change appear age appropriate. Vascular: Atherosclerosis noted. Skull: Intact. Sinuses/Orbits: Negative. Other: None. IMPRESSION: No acute abnormality. Atherosclerosis. Electronically Signed   By: Inge Rise M.D.   On: 04/26/2017 14:53   Dg Chest Port 1 View  Result Date: 04/26/2017 CLINICAL DATA:  Left upper chest soreness. Patient reports recent fall. EXAM: PORTABLE CHEST 1 VIEW COMPARISON:  PA and lateral chest 04/11/2016 and 02/07/2016. CT chest 02/14/2016. FINDINGS: The patient is status post mitral valve replacement. There is cardiomegaly without edema. Chronic left basilar opacity is unchanged. The right lung is clear. Aortic atherosclerosis is noted. No acute bony abnormality. IMPRESSION: Cardiomegaly without acute  disease. Electronically Signed   By: Inge Rise M.D.   On: 04/26/2017 13:41        Scheduled Meds: . amiodarone  200 mg Oral Daily  . B-complex with vitamin C  1 tablet Oral Daily  . cycloSPORINE  1 drop Both Eyes BID  . diltiazem  300 mg Oral Daily  . furosemide  80 mg Intravenous Q8H  . gabapentin  300 mg Oral BID  . mirabegron ER  50 mg Oral Daily  . multivitamin with minerals  1 tablet Oral QPM  . potassium chloride SA  40 mEq Oral BID  . rosuvastatin  5 mg Oral q1800  . sodium chloride flush  3 mL Intravenous Q12H  . traZODone  300 mg Oral QHS  . vitamin C  500 mg Oral BID   Continuous Infusions: . sodium chloride       LOS: 1 day    Time spent: 35 minutes    Moria Brophy A, MD [facility] Pager [phone]  If 7PM-7AM, please contact night-coverage [email or web address] Password [email or web address] 04/27/2017, 11:07 AM

## 2017-04-27 NOTE — Evaluation (Signed)
Occupational Therapy Evaluation Patient Details Name: NIKHIL OSEI MRN: [id number] DOB: Apr 19, 1931 Today's Date: 04/27/2017    History of Present Illness This 83 y.o. male admitted with increased edema, and falls.  Dx:  Rt sided heart failure, A-Fib, ARF, hypokalemia, thrombocytompenia.  PMH includes:  corneal scarring, PVCs, pulmonary HTN, neuropathy, MVP, melanoma, HTN, First degree AV block   Clinical Impression   Pt admitted with above. He demonstrates the below listed deficits and will benefit from continued OT to maximize safety and independence with BADLs.  Pt presents to OT with generalized weakness, impaired balance, decreased activity tolerance, impaired cognition.  He fatigues quite rapidly with activity, and requires mod - max A for ADLs and mod A for functional transfers.  He was incontinent of stool with no awareness of such.   Prior to admission, pt was living alone with 8 hours hired caregiver assist during the day, but reports he has had progressive difficulty managing and has had multiple falls.  Recommend SNF level rehab at discharge, and pt agreeable.  He may also need to consider transition to ALF after SNF.  Will follow acutely.        Follow Up Recommendations  SNF;Supervision/Assistance - 24 hour    Equipment Recommendations  None recommended by OT    Recommendations for Other Services       Precautions / Restrictions Precautions Precautions: Fall Restrictions Weight Bearing Restrictions: No      Mobility Bed Mobility Overal bed mobility: Needs Assistance Bed Mobility: Supine to Sit;Sit to Supine     Supine to sit: Mod assist Sit to supine: Mod assist   General bed mobility comments: Pt requires assist to move LEs off and onto bed   Transfers Overall transfer level: Needs assistance   Transfers: Sit to/from Stand Sit to Stand: Mod assist         General transfer comment: Pt stood x 3 with mod A to lift buttocks from bed.      Balance  Overall balance assessment: Needs assistance Sitting-balance support: Feet supported Sitting balance-Leahy Scale: Fair     Standing balance support: Bilateral upper extremity supported Standing balance-Leahy Scale: Poor Standing balance comment: reliant on bil. UE support                            ADL either performed or assessed with clinical judgement   ADL Overall ADL's : Needs assistance/impaired Eating/Feeding: Independent   Grooming: Wash/dry hands;Wash/dry face;Oral care;Brushing hair;Set up;Supervision/safety;Sitting   Upper Body Bathing: Moderate assistance;Sitting   Lower Body Bathing: Maximal assistance;Sit to/from stand   Upper Body Dressing : Moderate assistance;Sitting   Lower Body Dressing: Maximal assistance;Sit to/from stand   Toilet Transfer: Moderate assistance;Stand-pivot;BSC;RW   Toileting- Clothing Manipulation and Hygiene: Total assistance;Sit to/from stand Toileting - Clothing Manipulation Details (indicate cue type and reason): Pt incontinent of stool.  He was assisted with peri care in standing.   He required bil. UE support to maintain standing and was unable to assist with peri care      Functional mobility during ADLs: Moderate assistance;Rolling walker General ADL Comments: Pt fatigues quite rapidly with Activity and unable to complete ADLs without assistance      Vision Baseline Vision/History: Wears glasses Wears Glasses: At all times Patient Visual Report: No change from baseline       Perception     Praxis      Pertinent Vitals/Pain Pain Assessment: Faces Faces Pain Scale: Hurts little  more Pain Location: buttocks and scrotum with peri care  Pain Descriptors / Indicators: Grimacing Pain Intervention(s): Monitored during session;Repositioned     Hand Dominance Right   Extremity/Trunk Assessment Upper Extremity Assessment Upper Extremity Assessment: Generalized weakness;RUE deficits/detail RUE Deficits / Details: pt  reports h/o rt partial rotator cuff tear with limited shoulder ROM.  He reports he has also been having chest pain Rt side just below shoulder due to a fall    Lower Extremity Assessment Lower Extremity Assessment: Defer to PT evaluation   Cervical / Trunk Assessment Cervical / Trunk Assessment: Kyphotic   Communication Communication Communication: HOH   Cognition Arousal/Alertness: Awake/alert Behavior During Therapy: WFL for tasks assessed/performed Overall Cognitive Status: No family/caregiver present to determine baseline cognitive functioning Area of Impairment: Memory                     Memory: Decreased short-term memory         General Comments: Pt frequently repeats self and asks same questions repeatedly    General Comments  02 sats >92% on RA.   Pt with multiple areas of bruising.      Exercises     Shoulder Instructions      Home Living Family/patient expects to be discharged to:: Skilled nursing facility Living Arrangements: Alone Available Help at Discharge: Personal care attendant;Available PRN/intermittently Type of Home: House Home Access: Stairs to enter [employer] of Steps: 2 Entrance Stairs-Rails: None Home Layout: Two level Alternate Level Stairs-Number of Steps: 14 Alternate Level Stairs-Rails: Right Bathroom Shower/Tub: Occupational psychologist: Standard Bathroom Accessibility: Yes   Home Equipment: Environmental consultant - 2 wheels;Cane - quad;Other (comment) (stair lift )   Additional Comments: has hired caregivers 5 hours in am, and 3 hours in pm 7days/week       Prior Functioning/Environment Level of Independence: Needs assistance  Gait / Transfers Assistance Needed: ambulated with RW.  He reports increasingly frequent falls.  He uses stair lift to access second floor and reports difficulty descending outdoor/entry steps  ADL's / Homemaking Assistance Needed: Pt reports he requires intermittent assist with ADLs.  He  reports he becomes very fatigued with bathing and dressing therfore aids will sometimes assist.  He reports he now sponge bathes because he is fearful of falling in shower.  Aids perform all cooking and cleaning tasks.  he does not drive             OT Problem List: Decreased strength;Decreased activity tolerance;Impaired balance (sitting and/or standing);Decreased cognition;Decreased safety awareness;Decreased knowledge of use of DME or AE;Cardiopulmonary status limiting activity;Pain      OT Treatment/Interventions: Self-care/ADL training;Therapeutic exercise;Energy conservation;DME and/or AE instruction;Therapeutic activities;Cognitive remediation/compensation;Patient/family education;Balance training    OT Goals(Current goals can be found in the care plan section) Acute Rehab OT Goals Patient Stated Goal: to get stronger  OT Goal Formulation: With patient Time For Goal Achievement: 05/11/17 Potential to Achieve Goals: Good ADL Goals Pt Will Perform Grooming: with min assist;standing Pt Will Perform Upper Body Bathing: with set-up;with supervision;sitting Pt Will Perform Lower Body Bathing: with min assist;sit to/from stand Pt Will Transfer to Toilet: with min assist;ambulating;regular height toilet;bedside commode;grab bars Pt Will Perform Toileting - Clothing Manipulation and hygiene: with min assist;sit to/from stand  OT Frequency: Min 2X/week   Barriers to D/C: Decreased caregiver support          Co-evaluation              AM-PAC PT "6 Clicks" Daily  Activity     Outcome Measure Help from another person eating meals?: None Help from another person taking care of personal grooming?: A Little Help from another person toileting, which includes using toliet, bedpan, or urinal?: A Lot Help from another person bathing (including washing, rinsing, drying)?: A Lot Help from another person to put on and taking off regular upper body clothing?: A Lot Help from another person  to put on and taking off regular lower body clothing?: A Lot 6 Click Score: 15   End of Session Equipment Utilized During Treatment: Rolling walker Nurse Communication: Mobility status  Activity Tolerance: Patient limited by fatigue Patient left: in bed;with call bell/phone within reach  OT Visit Diagnosis: Unsteadiness on feet (R26.81);Muscle weakness (generalized) (M62.81)                Time: 9295-7473 OT Time Calculation (min): 39 min Charges:  OT General Charges $OT Visit: 1 Procedure OT Evaluation $OT Eval Moderate Complexity: 1 Procedure OT Treatments $Self Care/Home Management : 23-37 mins G-Codes:     [facility], OTR/L [phone]   [name] M 04/27/2017, 3:40 PM

## 2017-04-27 NOTE — Progress Notes (Signed)
CM following for DCP; awaiting for PT/OT evals for disposition needs; Aneta Mins [phone]

## 2017-04-27 NOTE — Consult Note (Signed)
[facility] Nurse wound consult note Reason for Consult: Consult requested for bilat legs.  Pt states he wears compression wraps which are changed Q Mon and Wed and is followed by the outpatient wound center for chronic stasis ulcers. Wound type: Left outer leg with healing full thickness wound; .3X.3X.1cm, pink and dry.  Other wound pt previously states was present to left leg has healed and there is no further open wound or drainage. Right leg with 2 previously reported wounds have healed at this time and there is no further open wound or drainage. Dressing procedure/placement/frequency: Applied silver alginate to left leg as was the previous plan of care, then applied kerlex and coban for compression.  Plan to change Wed if patient is still in the hospital at that time.  He can resume follow-up with the outpatient wound care center after discharge.  Discussed plan of care with patient and family member at the bedside and they verbalized understanding. Please re-consult if further assistance is needed.  Thank-you,  Julien Girt MSN, [place], [place], [place], [place]

## 2017-04-27 NOTE — Progress Notes (Signed)
PT Cancellation Note  Patient Details Name: [name] [name] MRN: [id number] DOB: Dec 31, 1930   Cancelled Treatment:    Reason Eval/Treat Not Completed: Other (comment) (Patient being cleaned).  Will return at later time for PT evaluation.   [name] 04/27/2017, 6:17 PM [name]. Sanjuana Kava, [place] Pager [phone]

## 2017-04-27 NOTE — Progress Notes (Signed)
Patient arrived to 3e17 from [facility]. A&Ox4. Admit with CHF and fall. VSS. SR on telemetry. Patient has bruising to right side of head/eye, left and right hips, right shoulder and left and right lower legs from previous falls. Right buttock has a 6cm X 2cm stage 2 pressure ulcer and MASD to groin. Patient has bilateral una boots and is a patient of the wound care center. Patient lives alone but has home health assistance. High fall risk protocal initiated. Patient oriented to call system and room.

## 2017-04-27 NOTE — Consult Note (Signed)
Advanced Heart Failure Team Consult Note   Primary Physician: Dr. Charlett Blake  Primary Cardiologist:  Dr. Irish Lack   Reason for Consultation: acute on chronic diastolic CHF  HPI:    [name] [name] is seen today for evaluation of acute on chronic diastolic CHF at the request of Dr. Johnsie Cancel.   [name] [name] is a 83 year old male with a history of diastolic CHF (echo 0/8657 with LVEF 50-55%, Mild AI, s/p MVR with mild residual MR, Mod LAE, Mod RV dilation, severe RAE, Severe TR), PAF/Aflutter on coumadin, MV repair in Nov. 2012 and PAH.   He was seen in Dr. Hassell Done office in April 2018 with worsening edema in the setting of medication noncompliance. RHC was suggested but he declined.   He presented to the CHF clinic on 04/21/17, weight was down 9 pounds after extra Lasix. His Lasix was stopped, and he was started on torsemide. He was offered admission but he wanted to try to stay home and increase his torsemide. It was felt that he had severe RV failure in the setting of long standing mitral valve disease. Weight at clinic visit was 195 pounds.  [name] [name] presented to the ED on 04/26/17 after a fall at home, denies syncope, just had leg weakness. Pertinent admission labs - creatinine 1.96, CT head neg for bleed. He tells me that he has had multiple falls at home (is on warfarin). He denies dizziness, palpitations. He took torsemide 40/20mg  "a few days", but says he does not like to take it if he has somewhere to go. Multiple bruises present, endorses bendopnea and orthopnea.    Review of Systems: [y] = yes, [ ]  = no   General: Weight gain Blue.Reese ]; Weight loss [ ] ; Anorexia [ ] ; Fatigue Blue.Reese ]; Fever [ ] ; Chills [ ] ; Weakness [ ]   Cardiac: Chest pain/pressure [ ] ; Resting SOB [ ] ; Exertional SOB Blue.Reese ]; Orthopnea Blue.Reese ]; Pedal Edema [ y]; Palpitations [ ] ; Syncope [ ] ; Presyncope [ ] ; Paroxysmal nocturnal dyspnea[ ]   Pulmonary: Cough [ ] ; Wheezing[ ] ; Hemoptysis[ ] ; Sputum [ ] ; Snoring [ ]   GI: Vomiting[  ]; Dysphagia[ ] ; Melena[ ] ; Hematochezia [ ] ; Heartburn[ ] ; Abdominal pain [ ] ; Constipation [ ] ; Diarrhea [ ] ; BRBPR [ ]   GU: Hematuria[ ] ; Dysuria [ ] ; Nocturia[ ]   Vascular: Pain in legs with walking [ ] ; Pain in feet with lying flat [ ] ; Non-healing sores [ ] ; Stroke [ ] ; TIA [ ] ; Slurred speech [ ] ;  Neuro: Headaches[ ] ; Vertigo[ ] ; Seizures[ ] ; Paresthesias[ ] ;Blurred vision [ ] ; Diplopia [ ] ; Vision changes [ ]   Ortho/Skin: Arthritis [ y]; Joint pain [ ] ; Muscle pain [ ] ; Joint swelling [ ] ; Back Pain [ ] ; Rash [ ]   Psych: Depression[ ] ; Anxiety[ ]   Heme: Bleeding problems [ ] ; Clotting disorders [ ] ; Anemia [ ]   Endocrine: Diabetes [ ] ; Thyroid dysfunction[ ]   Home Medications Prior to Admission medications   Medication Sig Start Date End Date Taking? Authorizing Provider  amiodarone (PACERONE) 200 MG tablet Take 200 mg by mouth daily.  09/03/16  Yes [provider]  b complex vitamins tablet Take 1 tablet by mouth daily.   Yes [provider]  CARTIA XT 300 MG 24 hr capsule Take 300 mg by mouth daily.  08/05/16  Yes [provider]  cycloSPORINE (RESTASIS) 0.05 % ophthalmic emulsion Place 1 drop into both eyes 2 (two) times daily.   Yes [provider]  gabapentin (NEURONTIN) 300 MG capsule Take 300 mg by mouth 2 (two) times daily.  08/12/16  Yes [provider]  glucosamine-chondroitin 500-400 MG tablet Take 1 tablet by mouth 2 (two) times daily.   Yes [provider]  Multiple Vitamin (MULTIVITAMIN WITH MINERALS) TABS tablet Take 1 tablet by mouth every evening.    Yes [provider]  MYRBETRIQ 50 MG TB24 tablet Take 1 tablet by mouth daily. 01/14/17  Yes [provider]  potassium chloride SA (K-DUR,KLOR-CON) 20 MEQ tablet Take 1 tablet (20 mEq total) by mouth daily. 11/04/16  Yes Weaver, Scott T, PA-C  rosuvastatin (CRESTOR) 5 MG tablet Take 5 mg by mouth daily at 6 PM.  06/23/16  Yes [provider]    TOPROL XL 50 MG 24 hr tablet Take 50 mg by mouth daily.  06/20/16  Yes [provider]  torsemide (DEMADEX) 20 MG tablet Take 40 mg (2 Tablets) in the AM and 20 mg (1 Tablet) in the PM. 04/21/17  Yes Itamar Mcgowan, Shaune Pascal, MD  traZODone (DESYREL) 100 MG tablet Take 300 mg by mouth at bedtime.  10/17/11  Yes Lars Pinks M, PA-C  vitamin C (ASCORBIC ACID) 500 MG tablet Take 500 mg by mouth 2 (two) times daily.   Yes [provider]  warfarin (COUMADIN) 5 MG tablet Take 2.5 mg by mouth daily at 6 PM.    Yes [provider]  furosemide (LASIX) 40 MG tablet Take 1 tablet (40 mg total) by mouth 2 (two) times daily. Patient not taking: Reported on 04/26/2017 02/25/17 05/26/17  [name], MD  warfarin (COUMADIN) 5 MG tablet TAKE 1 TABLET DAILY AS DIRECTED BY THE COUMADIN CLINIC Patient not taking: Reported on 04/26/2017 12/08/16   [name], MD    Past Medical History: Past Medical History:  Diagnosis Date  . Abnormality of gait 05/27/2016  . Adenomatous polyps   . Carpal tunnel syndrome 06/25/2016   Right  . Depressive disorder, not elsewhere classified   . First degree AV block    Holter 3/18: NSR, PACs, PVCs, no AFib, no pauses.  Marland Kitchen Hereditary and idiopathic peripheral neuropathy 06/25/2016  . Hypertension   . Internal nasal lesion 05/15/2013  . Melanoma ([place])    Left Shoulder  . Mitral regurgitation   . MVP (mitral valve prolapse)    a. With severe MR s/p Complex valvuloplasty including artificial Gore-tex neochord placement x4, chordal transposition x1, chordal release x1, # 32 mm Sorin Memo 3D Ring Annuloplasty 2012. // b. Echo 2/18: mild LVH, EF 50-55, mild AI, MV repair with mild MR, mod LAE, mod RVE, severe RAE, severe TR  . Neuropathy   . Normal coronary arteries    a. Normal coronary anatomy by cath 2012.  . Osteoarthritis    Knees  . PAF (paroxysmal atrial fibrillation) ([place])    a. Post-op MVR 2012.  Marland Kitchen Personal history of colonic polyps    . Prostate cancer ([place])   . Pulmonary HTN ([place])    a. Mild-mod by cath 2012.  . Pure hypercholesterolemia   . PVC (premature ventricular contraction)   . Thrombocytopenia ([place])   . Vision abnormalities    Cornea scarring    Past Surgical History: Past Surgical History:  Procedure Laterality Date  . CARDIAC CATHETERIZATION  09/2011   Pre-op for MVR -- normal coronaries.  Marland Kitchen CARDIOVERSION N/A 01/02/2016   Procedure: CARDIOVERSION;  Surgeon: Thayer Headings, MD;  Location: [facility];  Service: Cardiovascular;  Laterality: N/A;  . COLONOSCOPY W/ POLYPECTOMY    . INGUINAL HERNIA REPAIR  09/2009   Left  . KNEE ARTHROSCOPY      left x3  and right x2  . Melanoma Surgery     2001, 2005, 2006, 2009  . MITRAL VALVE REPAIR  10/01/2011   complex valvuloplasty with Goretex cord replacement and chordal transposition 69mm Sorin Memo 3D ring annuloplasty  . Nuclear Stress Test  09/2006   EF-64%, Normal  . PROSTATECTOMY  1993  . ROOT CANAL  08-19-12  . ROTATOR CUFF REPAIR  2003   left  . TEE WITHOUT CARDIOVERSION  09/26/2011   Procedure: TRANSESOPHAGEAL ECHOCARDIOGRAM (TEE);  Surgeon: Lelon Perla, MD;  Location: [facility] ENDOSCOPY;  Service: Cardiovascular;  Laterality: N/A;  . US ECHOCARDIOGRAPHY  09/2009, 08/1011   mild LVH,mild AI,MVP with mild MR, mild-mod. TR with mild Pulm. HTN, EF-55-60%    Family History: Family History  Problem Relation Age of Onset  . Clotting disorder Brother        CVA's  . Arthritis Mother   . Hypertension Mother   . Stroke Mother   . Hypertension Father   . Psychosis Father        psychiatric care  . Colon cancer Neg Hx   . Stomach cancer Neg Hx   . Heart attack Neg Hx     Social History: Social History   Social History  . Marital status: Widowed    Spouse name: N/A  . Number of children: 2  . Years of education: 12   Social History Main Topics  . Smoking status: Never Smoker  . Smokeless tobacco: Never Used  . Alcohol use No      Comment: Last drink in 2000  . Drug use: No  . Sexual activity: Not Asked   Other Topics Concern  . None   Social History Narrative   Retired Brewing technologist   Widower   2 children   Drinks 1 cup of coffee per day    Allergies:  No Known Allergies  Objective:    Vital Signs:   Temp:  [97.5 F (36.4 C)-98.2 F (36.8 C)] 98.2 F (36.8 C) (06/11 0439) Pulse Rate:  [53-64] 60 (06/11 0439) Resp:  [11-18] 18 (06/11 0439) BP: (107-135)/(53-92) 116/67 (06/11 0439) SpO2:  [88 %-97 %] 97 % (06/11 0439) Weight:  [191 lb (86.6 kg)-200 lb (90.7 kg)] 191 lb (86.6 kg) (06/11 0500) Last BM Date: 04/26/17  Weight change: Filed Weights   04/26/17 1938 04/27/17 0013 04/27/17 0500  Weight: 191 lb (86.6 kg) 200 lb (90.7 kg) 191 lb (86.6 kg)    Intake/Output:   Intake/Output Summary (Last 24 hours) at 04/27/17 0950 Last data filed at 04/27/17 4920  Gross per 24 hour  Intake              720 ml  Output             4090 ml  Net            -3370 ml     Physical Exam: General:  Elderly male, NAD.  HEENT: Bruising on right side forehead.  Neck: supple. JVP to jaw with prominent CV wave. Carotids 2+ bilat; no bruits. No lymphadenopathy or thyromegaly appreciated. Cor: PMI nondisplaced. Irregular rate and rhythm. 2/6 TR murmur.  Lungs: clear in upper lobes bilaterally. Crackles in bilateral lower lobes.  Abdomen: soft, nontender, + distended. No hepatosplenomegaly. No bruits or masses. Good bowel sounds. Extremities: no  cyanosis, clubbing, rash. 3+ edema to knees. Unna boots in place.  Neuro: alert & orientedx3, cranial nerves grossly intact. moves all 4 extremities w/o difficulty. Affect pleasant  Telemetry: NSR.   Labs: Basic Metabolic Panel:  Recent Labs Lab 04/24/17 1036 04/26/17 1347 04/27/17 0255  NA 143 144 146*  K 3.5 3.6 3.1*  CL 103 104 104  CO2 32 33* 34*  GLUCOSE 149* 121* 110*  BUN 36* 36* 29*  CREATININE 1.89* 1.91* 1.55*  CALCIUM 9.1 9.0 8.5*    Liver  Function Tests:  Recent Labs Lab 04/26/17 1347  AST 26  ALT 24  ALKPHOS 55  BILITOT 1.9*  PROT 7.0  ALBUMIN 3.4*    Recent Labs Lab 04/26/17 1347  LIPASE 37   No results for input(s): AMMONIA in the last 168 hours.  CBC:  Recent Labs Lab 04/26/17 1347  WBC 6.9  NEUTROABS 3.9  HGB 10.2*  HCT 34.3*  MCV 85.3  PLT 64*     Coagulation Studies:  Recent Labs  04/26/17 1347  LABPROT 38.2*  INR 3.77     Imaging: Dg Shoulder Right  Result Date: 04/26/2017 CLINICAL DATA:  Fall, right shoulder pain EXAM: RIGHT SHOULDER - 2+ VIEW COMPARISON:  None. FINDINGS: No fracture or dislocation is seen. The joint spaces are preserved. Visualized soft tissues are within normal limits. Visualized right lung is clear. IMPRESSION: Negative. Electronically Signed   By: Julian Hy M.D.   On: 04/26/2017 13:01   Ct Head Wo Contrast  Result Date: 04/26/2017 CLINICAL DATA:  Unsteady gait and multiple falls over the past month. The patient most recently fell last night. EXAM: CT HEAD WITHOUT CONTRAST TECHNIQUE: Contiguous axial images were obtained from the base of the skull through the vertex without intravenous contrast. COMPARISON:  Head CT scan 04/03/2014. FINDINGS: Brain: No evidence of acute abnormality including hemorrhage, infarct, mass lesion, mass effect, midline shift or abnormal extra-axial fluid collection. No hydrocephalus or pneumocephalus. Mild atrophy and chronic microvascular ischemic change appear age appropriate. Vascular: Atherosclerosis noted. Skull: Intact. Sinuses/Orbits: Negative. Other: None. IMPRESSION: No acute abnormality. Atherosclerosis. Electronically Signed   By: Inge Rise M.D.   On: 04/26/2017 14:53   Dg Chest Port 1 View  Result Date: 04/26/2017 CLINICAL DATA:  Left upper chest soreness. Patient reports recent fall. EXAM: PORTABLE CHEST 1 VIEW COMPARISON:  PA and lateral chest 04/11/2016 and 02/07/2016. CT chest 02/14/2016. FINDINGS: The patient  is status post mitral valve replacement. There is cardiomegaly without edema. Chronic left basilar opacity is unchanged. The right lung is clear. Aortic atherosclerosis is noted. No acute bony abnormality. IMPRESSION: Cardiomegaly without acute disease. Electronically Signed   By: Inge Rise M.D.   On: 04/26/2017 13:41       Medications:     Current Medications: . amiodarone  200 mg Oral Daily  . B-complex with vitamin C  1 tablet Oral Daily  . cycloSPORINE  1 drop Both Eyes BID  . diltiazem  300 mg Oral Daily  . furosemide  80 mg Intravenous Q8H  . gabapentin  300 mg Oral BID  . metoprolol succinate  50 mg Oral Daily  . mirabegron ER  50 mg Oral Daily  . multivitamin with minerals  1 tablet Oral QPM  . potassium chloride SA  40 mEq Oral BID  . rosuvastatin  5 mg Oral q1800  . sodium chloride flush  3 mL Intravenous Q12H  . traZODone  300 mg Oral QHS  . vitamin C  500  mg Oral BID     Infusions: . sodium chloride 75 mL/hr at 04/26/17 1359  . sodium chloride        Assessment/Plan   1. Acute on chronic diastolic CHF: Echo 04/9449 with EF 50-55%, mild MR, RV moderately dilated, severe TR.  - NYHA IV - Volume overloaded on exam in the setting of RV failure - Continue IV diuresis, he has been getting 75 ml/hour (order from ED as he came in with a fall) of fluid for the past 20 hours. Turned off IV fluid.  - Continue lasix 80mg  TID. Give metolazone 2.5mg  today.  - Stop metoprolol, RV failure.  - RHC when volume more stable. May need milrinone for RV support if he does not diurese.  2. 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  3. S/p mitral valve repair - valve repair functioning normally by last Echo 4. History of PAF - NSR today.  - Continue Amiodarone 200 mg daily.  - he is also on diltiazem 300mg  daily, will stop for now. BP soft, frequent falls and RV failure.  - This patients CHA2DS2-VASc Score and unadjusted Ischemic Stroke  Rate (% per year) is equal to 4.8 % stroke rate/year from a score of 4 Above score calculated as 1 point each if present [CHF, HTN, DM, Vascular=MI/PAD/Aortic Plaque, Age if 65-74, or Male], 2 points each if present [Age > 75, or Stroke/TIA/TE] - continue warfarin per pharm, on hold for INR of 3.7 yesterday. INR ordered for today.  5. Frequent falls - He has frequent falls at home, he has a higher risk for bleed with warfarin usage. Will get PT consult. Question whether his risk of bleeding   Length of Stay: [place], NP  04/27/2017, 9:50 AM  Advanced Heart Failure Team Pager [phone] (M-F; 7a - 4p)  Please contact [facility] for night-coverage after hours (4p -7a ) and weekends on [email or web address]  Patient seen and examined with [name], NP. We discussed all aspects of the encounter. I agree with the assessment and plan as stated above.   [name] [name] is well known to me from recent clinic visit. He has PAH and RV failure in the setting of longstanding MV disease. He was admitted after a fall when his legs got weak. He denies syncope or presyncope. He is responding well to IV lasix. Would continue diuresis for at least one more day. Plan RHC later in the week for further assessment. Watch renal function closely. May need SNF on d/c.   [name], MD  6:09 PM

## 2017-04-28 ENCOUNTER — Ambulatory Visit: Payer: Medicare Other | Admitting: Podiatry

## 2017-04-28 ENCOUNTER — Telehealth: Payer: Self-pay | Admitting: *Deleted

## 2017-04-28 DIAGNOSIS — I2721 Secondary pulmonary arterial hypertension: Secondary | ICD-10-CM | POA: Diagnosis present

## 2017-04-28 DIAGNOSIS — I50813 Acute on chronic right heart failure: Secondary | ICD-10-CM

## 2017-04-28 DIAGNOSIS — I5033 Acute on chronic diastolic (congestive) heart failure: Secondary | ICD-10-CM | POA: Diagnosis present

## 2017-04-28 LAB — PROTIME-INR
INR: 2.91
PROTHROMBIN TIME: 31.1 s — AB (ref 11.4–15.2)

## 2017-04-28 LAB — BASIC METABOLIC PANEL
Anion gap: 8 (ref 5–15)
BUN: 31 mg/dL — AB (ref 6–20)
CALCIUM: 9.1 mg/dL (ref 8.9–10.3)
CO2: 36 mmol/L — ABNORMAL HIGH (ref 22–32)
CREATININE: 1.79 mg/dL — AB (ref 0.61–1.24)
Chloride: 102 mmol/L (ref 101–111)
GFR calc Af Amer: 38 mL/min — ABNORMAL LOW (ref >60)
GFR, EST NON AFRICAN AMERICAN: 33 mL/min — AB (ref >60)
Glucose, Bld: 114 mg/dL — ABNORMAL HIGH (ref 65–99)
Potassium: 3.5 mmol/L (ref 3.5–5.1)
SODIUM: 146 mmol/L — AB (ref 135–145)

## 2017-04-28 LAB — MAGNESIUM: Magnesium: 2.5 mg/dL — ABNORMAL HIGH (ref 1.7–2.4)

## 2017-04-28 MED ORDER — WARFARIN SODIUM 2.5 MG PO TABS
2.5000 mg | ORAL_TABLET | Freq: Once | ORAL | Status: AC
  Administered 2017-04-28: 2.5 mg via ORAL
  Filled 2017-04-28: qty 1

## 2017-04-28 MED ORDER — METOLAZONE 5 MG PO TABS
5.0000 mg | ORAL_TABLET | Freq: Once | ORAL | Status: DC
  Filled 2017-04-28: qty 1

## 2017-04-28 MED ORDER — POTASSIUM CHLORIDE CRYS ER 20 MEQ PO TBCR: 40.0000 meq | EXTENDED_RELEASE_TABLET | Freq: Every day | ORAL | Status: DC

## 2017-04-28 MED ORDER — FUROSEMIDE 10 MG/ML IJ SOLN
80.0000 mg | Freq: Once | INTRAMUSCULAR | Status: AC
Start: 2017-04-28 — End: 2017-04-28
  Administered 2017-04-28: 80 mg via INTRAVENOUS
  Filled 2017-04-28: qty 8

## 2017-04-28 MED ORDER — WARFARIN - PHARMACIST DOSING INPATIENT
Freq: Every day | Status: DC
  Administered 2017-04-28 – 2017-04-29 (×2)

## 2017-04-28 MED ORDER — SODIUM CHLORIDE 0.9 % IV SOLN
INTRAVENOUS | Status: DC
  Administered 2017-04-29: 08:00:00 via INTRAVENOUS

## 2017-04-28 NOTE — Telephone Encounter (Signed)
Received Plan of Care/Treatment [post fall] from [facility], forwarded to provider/SLS 06/12

## 2017-04-28 NOTE — Evaluation (Signed)
Physical Therapy Evaluation Patient Details Name: [name] [name] MRN: [id number] DOB: [birth year] Today's Date: 04/28/2017   History of Present Illness  This 83 y.o. male admitted with increased edema, and falls.  Dx:  Rt sided heart failure, A-Fib, ARF, hypokalemia, thrombocytompenia.  PMH includes:  corneal scarring, PVCs, pulmonary HTN, neuropathy, MVP, melanoma, HTN, First degree AV block  Clinical Impression  Pt is very weak and unsteady on his feet and is at high risk of falling right now with a RW and with assistance.  He would benefit from SNF level rehab at discharge.   PT to follow acutely for deficits listed below.       Follow Up Recommendations SNF    Equipment Recommendations  Wheelchair cushion (measurements PT);Wheelchair (measurements PT)    Recommendations for Other Services   NA    Precautions / Restrictions Precautions Precautions: Fall Precaution Comments: multiple falls at home with RW Restrictions Weight Bearing Restrictions: No      Mobility  Bed Mobility Overal bed mobility: Needs Assistance Bed Mobility: Supine to Sit     Supine to sit: Min assist;HOB elevated     General bed mobility comments: Min assist to help support trunk and progress legs over EOB.  HOB elevated ~30 degrees.   Transfers Overall transfer level: Needs assistance Equipment used: Rolling walker (2 wheeled) Transfers: Sit to/from [facility] Sit to Stand: From elevated surface;Mod assist Stand pivot transfers: From elevated surface;Mod assist       General transfer comment: One person mod assist to stand from highly elevated bed (pt is 6'4" and struggles to get to standing).  Mod assist to support trunk while taking pivotal steps with RW to the recliner chair.  Pt was unable to walk further safely with only one person's assist.          Balance Overall balance assessment: Needs assistance Sitting-balance support: Feet supported;No upper extremity  supported Sitting balance-Leahy Scale: Fair     Standing balance support: Bilateral upper extremity supported Standing balance-Leahy Scale: Poor                               Pertinent Vitals/Pain Pain Assessment: Faces Faces Pain Scale: Hurts even more Pain Location: bil knees and buttocks Pain Descriptors / Indicators: Grimacing;Guarding Pain Intervention(s): Limited activity within patient's tolerance;Monitored during session;Repositioned    Home Living Family/patient expects to be discharged to:: Skilled nursing facility Living Arrangements: Alone Available Help at Discharge: Personal care attendant;Available PRN/intermittently (7 days a week 8am-2pm, 4-8 pm) Type of Home: House Home Access: Stairs to enter Entrance Stairs-Rails: None Entrance Stairs-Number of Steps: 2 Home Layout: Two level Home Equipment: Walker - 2 wheels;Cane - quad;Other (comment) (stair lift for second story access, lift chair)      Prior Function Level of Independence: Needs assistance   Gait / Transfers Assistance Needed: ambulated with RW.  He reports increasingly frequent falls.  He uses stair lift to access second floor and reports difficulty descending outdoor/entry steps.  Pt reports all of his recent falls have been with his walker, but when his aids were not there and he was home alone.   ADL's / Homemaking Assistance Needed: Pt reports he requires intermittent assist with ADLs.  He reports he becomes very fatigued with bathing and dressing therfore aids will sometimes assist.  He reports he now sponge bathes because he is fearful of falling in shower.  Aids perform all cooking and  cleaning tasks.  he does not drive (per OT note)        Hand Dominance   Dominant Hand: Right    Extremity/Trunk Assessment   Upper Extremity Assessment Upper Extremity Assessment: Defer to OT evaluation    Lower Extremity Assessment Lower Extremity Assessment: RLE deficits/detail;LLE  deficits/detail RLE Deficits / Details: bil LEs in wraps, pt reporting knee arthritis and grimaces with multiple attempts at standing from elelvated bed.  Pt needed a minute to kick legs and lift hips EOB before he could successfully stand up.  Bil LE give away on him.  LLE Deficits / Details: bil LEs in wraps, pt reporting knee arthritis and grimaces with multiple attempts at standing from elelvated bed.  Pt needed a minute to kick legs and lift hips EOB before he could successfully stand up.  Bil LE give away on him.     Cervical / Trunk Assessment Cervical / Trunk Assessment: Kyphotic  Communication   Communication: HOH  Cognition Arousal/Alertness: Awake/alert Behavior During Therapy: WFL for tasks assessed/performed Overall Cognitive Status: No family/caregiver present to determine baseline cognitive functioning (not specifically tested, hx seemed clear and accurate)                                           Exercises General Exercises - Lower Extremity Long Arc Quad: AAROM;Both;10 reps Hip Flexion/Marching: AAROM;Both;10 reps   Assessment/Plan    PT Assessment Patient needs continued PT services  PT Problem List Decreased strength;Decreased activity tolerance;Decreased balance;Decreased mobility;Decreased knowledge of use of DME;Decreased knowledge of precautions;Pain;Decreased skin integrity       PT Treatment Interventions DME instruction;Functional mobility training;Therapeutic activities;Therapeutic exercise;Stair training;Gait training;Balance training;Patient/family education    PT Goals (Current goals can be found in the Care Plan section)  Acute Rehab PT Goals Patient Stated Goal: to get stronger  PT Goal Formulation: With patient Time For Goal Achievement: 05/12/17 Potential to Achieve Goals: Fair    Frequency Min 2X/week (would benefit from more days per week at [facility])   Barriers to discharge Decreased caregiver support pt does not have 24/7  assist at his home and most of his falls have been when his aids were not with him.        AM-PAC PT "6 Clicks" Daily Activity  Outcome Measure Difficulty turning over in bed (including adjusting bedclothes, sheets and blankets)?: Total Difficulty moving from lying on back to sitting on the side of the bed? : Total Difficulty sitting down on and standing up from a chair with arms (e.g., wheelchair, bedside commode, etc,.)?: Total Help needed moving to and from a bed to chair (including a wheelchair)?: A Lot Help needed walking in hospital room?: A Lot Help needed climbing 3-5 steps with a railing? : Total 6 Click Score: 8    End of Session Equipment Utilized During Treatment: Back brace Activity Tolerance: Patient limited by fatigue;Patient limited by pain Patient left: in chair;with call bell/phone within reach;with chair alarm set   PT Visit Diagnosis: Repeated falls (R29.6);Muscle weakness (generalized) (M62.81);Difficulty in walking, not elsewhere classified (R26.2);Pain Pain - Right/Left:  (bil) Pain - part of body: Knee (knees and buttocks)    Time: 1350-1410 PT Time Calculation (min) (ACUTE ONLY): 20 min   Charges:       Wells Guiles B. Nour Scalise, PT, DPT [phone]    PT Evaluation $PT Eval Moderate Complexity: 1 Procedure  04/28/2017, 5:02 PM

## 2017-04-28 NOTE — Progress Notes (Signed)
ANTICOAGULATION CONSULT NOTE - Initial Consult  Pharmacy Consult for Coumadin Indication: atrial fibrillation  No Known Allergies  Patient Measurements: Height: 6\' 4"  (193 cm) Weight: 193 lb (87.5 kg) IBW/kg (Calculated) : 86.8 Heparin Dosing Weight: n/a  Vital Signs: Temp: 98.3 F (36.8 C) (06/12 1145) Temp Source: Oral (06/12 1145) BP: 105/52 (06/12 1145) Pulse Rate: 71 (06/12 1145)  Labs:  Recent Labs  04/26/17 1347 04/27/17 0255 04/27/17 1039 04/28/17 0601  HGB 10.2*  --   --   --   HCT 34.3*  --   --   --   PLT 64*  --   --   --   LABPROT 38.2*  --  39.4* 31.1*  INR 3.77  --  3.92 2.91  CREATININE 1.91* 1.55*  --  1.79*    Estimated Creatinine Clearance: 37.7 mL/min (A) (by C-G formula based on SCr of 1.79 mg/dL (H)).   Medical History: Past Medical History:  Diagnosis Date  . Abnormality of gait 05/27/2016  . Adenomatous polyps   . Carpal tunnel syndrome 06/25/2016   Right  . Depressive disorder, not elsewhere classified   . First degree AV block    Holter 3/18: NSR, PACs, PVCs, no AFib, no pauses.  Marland Kitchen Hereditary and idiopathic peripheral neuropathy 06/25/2016  . Hypertension   . Internal nasal lesion 05/15/2013  . Melanoma ([place])    Left Shoulder  . Mitral regurgitation   . MVP (mitral valve prolapse)    a. With severe MR s/p Complex valvuloplasty including artificial Gore-tex neochord placement x4, chordal transposition x1, chordal release x1, # 32 mm Sorin Memo 3D Ring Annuloplasty 2012. // b. Echo 2/18: mild LVH, EF 50-55, mild AI, MV repair with mild MR, mod LAE, mod RVE, severe RAE, severe TR  . Neuropathy   . Normal coronary arteries    a. Normal coronary anatomy by cath 2012.  . Osteoarthritis    Knees  . PAF (paroxysmal atrial fibrillation) ([place])    a. Post-op MVR 2012.  Marland Kitchen Personal history of colonic polyps   . Prostate cancer ([place])   . Pulmonary HTN ([place])    a. Mild-mod by cath 2012.  . Pure hypercholesterolemia   . PVC (premature  ventricular contraction)   . Thrombocytopenia ([place])   . Vision abnormalities    Cornea scarring    Medications:  Scheduled:  . amiodarone  200 mg Oral Daily  . B-complex with vitamin C  1 tablet Oral Daily  . cycloSPORINE  1 drop Both Eyes BID  . diltiazem  300 mg Oral Daily  . gabapentin  300 mg Oral BID  . mirabegron ER  50 mg Oral Daily  . multivitamin with minerals  1 tablet Oral QPM  . [START ON 04/29/2017] potassium chloride SA  40 mEq Oral Daily  . rosuvastatin  5 mg Oral q1800  . sodium chloride flush  3 mL Intravenous Q12H  . traZODone  300 mg Oral QHS  . vitamin C  500 mg Oral BID    Assessment: 83 yo male admitted with CHF exacerbation.  On chronic Coumadin for afib.  Admitted with supratherapeutic INR.  Multiple bruises and frequent falls PTA.  Discussed with HF team, will continue Coumadin for now since patient going to SNF at discharge.  PTA Coumadin dose 2.5 mg daily  Goal of Therapy:  INR 2-3 Monitor platelets by anticoagulation protocol: Yes   Plan:  1. Coumadin 2.5 mg po x 1 tonight. 2. Daily PT/INR 3. Recheck CBC tomorrow.  Uvaldo Rising, BCPS  Clinical Pharmacist Pager [phone]  04/28/2017 1:48 PM

## 2017-04-28 NOTE — Progress Notes (Signed)
Advanced Heart Failure Rounding Note  PCP: Dr. Charlett Blake Primary Cardiologist: Dr. Jose Persia. [name] [name]   Subjective:    Admitted 04/26/17 with fall. Volume overloaded, started on IV lasix. Weight up 2 pounds today, -4.8 L for admission.   Creatinine 1.89->1.91->1.55->1.74. Feels tired, feels SOB at times, denies chest pain and palpitations.     Objective:   Weight Range: 193 lb (87.5 kg) Body mass index is 23.49 kg/m.   Vital Signs:   Temp:  [97.9 F (36.6 C)-98.6 F (37 C)] 98.6 F (37 C) (06/12 0409) Pulse Rate:  [61-65] 65 (06/12 0409) Resp:  [18] 18 (06/12 0409) BP: (104-108)/(54-61) 105/54 (06/12 0409) SpO2:  [93 %-95 %] 93 % (06/12 0409) Weight:  [193 lb (87.5 kg)] 193 lb (87.5 kg) (06/12 0409) Last BM Date: 04/27/17  Weight change: Filed Weights   04/27/17 0013 04/27/17 0500 04/28/17 0409  Weight: 200 lb (90.7 kg) 191 lb (86.6 kg) 193 lb (87.5 kg)    Intake/Output:   Intake/Output Summary (Last 24 hours) at 04/28/17 0658 Last data filed at 04/28/17 0410  Gross per 24 hour  Intake              597 ml  Output             2700 ml  Net            -2103 ml     Physical Exam: General:  Elderly male, lying in bed. NAD.  HEENT: normal Neck: supple. JVP 6 cm. Carotids 2+ bilat; no bruits. No lymphadenopathy or thyromegaly appreciated. Cor: PMI nondisplaced. Regular rate & rhythm. No rubs, gallops. 2/6 TR murmur.  Lungs: crackles in bilateral bases. Normal effort.  Abdomen: soft, nontender, + distended. No hepatosplenomegaly. No bruits or masses. Good bowel sounds. Extremities: no cyanosis, clubbing, rash, 3+ edema to knees.  Neuro: alert & orientedx3, cranial nerves grossly intact. moves all 4 extremities w/o difficulty. Affect pleasant   Telemetry: NSR   Labs: CBC  Recent Labs  04/26/17 1347  WBC 6.9  NEUTROABS 3.9  HGB 10.2*  HCT 34.3*  MCV 85.3  PLT 64*   Basic Metabolic Panel  Recent Labs  04/26/17 1347 04/27/17 0255  NA 144 146*  K  3.6 3.1*  CL 104 104  CO2 33* 34*  GLUCOSE 121* 110*  BUN 36* 29*  CREATININE 1.91* 1.55*  CALCIUM 9.0 8.5*   Liver Function Tests  Recent Labs  04/26/17 1347  AST 26  ALT 24  ALKPHOS 55  BILITOT 1.9*  PROT 7.0  ALBUMIN 3.4*    Recent Labs  04/26/17 1347  LIPASE 37    Transthoracic Echocardiography 12/19/16 Study Conclusions  - Left ventricle: The cavity size was normal. Wall thickness was   increased in a pattern of mild LVH. Systolic function was normal.   The estimated ejection fraction was in the range of 50% to 55%. - Aortic valve: There was mild regurgitation. - Mitral valve: Post mitral valve repair with just mild residual   MR. - Left atrium: The atrium was moderately dilated. - Right ventricle: The cavity size was moderately dilated. - Right atrium: The atrium was severely dilated. - Atrial septum: No defect or patent foramen ovale was identified. - Tricuspid valve: There was severe regurgitation       Medications:     Scheduled Medications: . amiodarone  200 mg Oral Daily  . B-complex with vitamin C  1 tablet Oral Daily  . cycloSPORINE  1 drop Both  Eyes BID  . diltiazem  300 mg Oral Daily  . furosemide  80 mg Intravenous Q8H  . gabapentin  300 mg Oral BID  . mirabegron ER  50 mg Oral Daily  . multivitamin with minerals  1 tablet Oral QPM  . potassium chloride SA  40 mEq Oral BID  . rosuvastatin  5 mg Oral q1800  . sodium chloride flush  3 mL Intravenous Q12H  . traZODone  300 mg Oral QHS  . vitamin C  500 mg Oral BID     Infusions: . sodium chloride       PRN Medications:  sodium chloride, acetaminophen, ondansetron (ZOFRAN) IV, polyethylene glycol, sodium chloride flush   Assessment/Plan   1. Fall: Denies syncope, remembers falling. Awaiting PT eval. OT eval done and SNF was reccommended.  2. Acute on chronic diastolic CHF: Echo 0/1749 with EF 50-55%, mild MR, RV moderately dilated, severe TR.  - NYHA IV - Volume status  improving.  - One more dose of IV lasix today, then can stop.   - No beta blocker with RV failure.  - Consider milrinone short term.   - Plan for RHC this week.  3. 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  - Continue diuresis as above.  4. S/p mitral valve repair - valve repair functioning normally by last Echo - Continue current medical management.  5. History of PAF - NSR today.  - Continue Amiodarone 200 mg daily.  - Continue diltiazem 300 mg daily.  - This patients CHA2DS2-VASc Score and unadjusted Ischemic Stroke Rate (% per year) is equal to 4.8 % stroke rate/year from a score of 4 Above score calculated as 1 point each if present [CHF, HTN, DM, Vascular=MI/PAD/Aortic Plaque, Age if 65-74, or Male], 2 points each if present [Age > 75, or Stroke/TIA/TE] - continue warfarin per pharm, INR 2.91 today.  6. Hypokalemia - BMET pending today,  - Will increase KCl if needed.    Length of Stay: [place], NP  04/28/2017, 6:58 AM  Advanced Heart Failure Team Pager [phone] (M-F; 7a - 4p)  Please contact [facility] for night-coverage after hours (4p -7a ) and weekends on [email or web address]  Patient seen and examined with [name], NP. We discussed all aspects of the encounter. I agree with the assessment and plan as stated above.   Volume status improved though weight not changed much. Will need RHC to assess hemodynamics including pulmonary pressures and outputs in setting of longstanding MV disease. Will plan cath in am. OK to continue coumadin (discussed personally with PharmD). Maintaining NSR continue amio.   On d/c will probably send home on torsemide 40 daily with 20 extra in pm as needed.   [name], MD  9:52 PM

## 2017-04-28 NOTE — Progress Notes (Addendum)
PROGRESS NOTE  [name] [name]  ZOX:[id number] DOB: 12-Sep-1931 DOA: 04/26/2017 PCP: [name], MD Outpatient Specialists:  Subjective: Seen with his daughter at bedside, 8 complaining about severe weakness. Swelling in the lower extremity improving but not back to baseline. His daughter concerned about his safety to go back home, likely he'll need SNF, CSW.  Brief Narrative:  [name] [name] is a 83 y.o. male with medical history significant of diastolic heart failure, PAF/atrial flutter on Coumadin, and history of severe MV prolapse status post repair. The patient reports that he has been falling daily. He is on Coumadin at home which is concerning he even hit his head. He denies any syncope or fevers. The problem has been persistent and nothing he is aware of makes it better or worse. He has also been complaining of edema which has been an ongoing problem for the last week. The problem has been getting worse. As by him taking his diuretics he reports he has been having an increase in edema. Of note patient has been known to be noncompliant with diuretic regimen per cardiology notes.  Assessment & Plan:   Active Problems:   GERD   Atrial fibrillation (HCC)   Chronic diastolic heart failure (HCC)   Right-sided heart failure (HCC)   Right-sided heart failure (HCC) -Massive lower extremity edema, has Ace wraps around his legs. -Cardiology consulted, started on Lasix 80 mg 3 times a day. -Per cardiology no beta blockers for now, consider milrinone, IV for today. -Plan for right heart catheter later this week.  Atrial fibrillation (HCC) -Rate is controlled, continue amiodarone and potassium. CHA2DS2-VASc is 4. -INR was supratherapeutic at 3.7 on admission, Coumadin held INR is 2.9 today, pharmacy to dose.  GERD - stable currently  Neuropathy - pt on Gabapentin will continue  ARF -Baseline around 1.1, admitted with creatinine 1.9. -Started diuresis currently creatinine  1.5.  Hypokalemia Potassium was 3.1, replete with oral supplements.  Hypercholesterolemia - continue Crestor  Thrombocytopenia -This is chronic, no bleeding, has multiple bruises in his body. -The bruising is secondary to frequent, from cytopenia and being on anticoagulation.  Generalized weakness and frequent falls -Patient has muscle wasting, very heavy lower extremities secondary to edema and generalized weakness. -PT to evaluate, patient feels unsafe to go back home.  Pressure ulcer  -Per nursing staff documentation: Right buttock has a 6cm X 2cm stage 2 pressure ulcer   DVT prophylaxis: Coumadin: Therapeutic INR Code Status: Full Code Family Communication:  Disposition Plan: Likely SNF after right heart catheter and its okay with cardiology for discharge. Diet: Diet heart healthy/carb modified Room service appropriate? Yes; Fluid consistency: Thin  Consultants:   Cards  Procedures:   None  Antimicrobials:   None   Objective: Vitals:   04/27/17 2020 04/28/17 0409 04/28/17 1050 04/28/17 1145  BP: 108/61 (!) 105/54 108/60 (!) 105/52  Pulse: 61 65  71  Resp: 18 18  16   Temp: 98.1 F (36.7 C) 98.6 F (37 C)  98.3 F (36.8 C)  TempSrc: Oral Oral  Oral  SpO2: 93% 93%  91%  Weight:  87.5 kg (193 lb)    Height:        Intake/Output Summary (Last 24 hours) at 04/28/17 1151 Last data filed at 04/28/17 1100  Gross per 24 hour  Intake              530 ml  Output             2450 ml  Net            -1920 ml   Filed Weights   04/27/17 0013 04/27/17 0500 04/28/17 0409  Weight: 90.7 kg (200 lb) 86.6 kg (191 lb) 87.5 kg (193 lb)    Examination: General exam: Appears calm and comfortable  Respiratory system: Clear to auscultation. Respiratory effort normal. Cardiovascular system: S1 & S2 heard, RRR. No JVD, murmurs, rubs, gallops or clicks. No pedal edema. Gastrointestinal system: Abdomen is nondistended, soft and nontender. No organomegaly or masses felt.  Normal bowel sounds heard. Central nervous system: Alert and oriented. No focal neurological deficits. Extremities: Symmetric 5 x 5 power. Skin: No rashes, lesions or ulcers Psychiatry: Judgement and insight appear normal. Mood & affect appropriate.   Data Reviewed: I have personally reviewed following labs and imaging studies  CBC:  Recent Labs Lab 04/26/17 1347  WBC 6.9  NEUTROABS 3.9  HGB 10.2*  HCT 34.3*  MCV 85.3  PLT 64*   Basic Metabolic Panel:  Recent Labs Lab 04/24/17 1036 04/26/17 1347 04/27/17 0255 04/28/17 0601  NA 143 144 146* 146*  K 3.5 3.6 3.1* 3.5  CL 103 104 104 102  CO2 32 33* 34* 36*  GLUCOSE 149* 121* 110* 114*  BUN 36* 36* 29* 31*  CREATININE 1.89* 1.91* 1.55* 1.79*  CALCIUM 9.1 9.0 8.5* 9.1  MG  --   --   --  2.5*   GFR: Estimated Creatinine Clearance: 37.7 mL/min (A) (by C-G formula based on SCr of 1.79 mg/dL (H)). Liver Function Tests:  Recent Labs Lab 04/26/17 1347  AST 26  ALT 24  ALKPHOS 55  BILITOT 1.9*  PROT 7.0  ALBUMIN 3.4*    Recent Labs Lab 04/26/17 1347  LIPASE 37   No results for input(s): AMMONIA in the last 168 hours. Coagulation Profile:  Recent Labs Lab 04/26/17 1347 04/27/17 1039 04/28/17 0601  INR 3.77 3.92 2.91   Cardiac Enzymes: No results for input(s): CKTOTAL, CKMB, CKMBINDEX, TROPONINI in the last 168 hours. BNP (last 3 results) No results for input(s): PROBNP in the last 8760 hours. HbA1C: No results for input(s): HGBA1C in the last 72 hours. CBG: No results for input(s): GLUCAP in the last 168 hours. Lipid Profile: No results for input(s): CHOL, HDL, LDLCALC, TRIG, CHOLHDL, LDLDIRECT in the last 72 hours. Thyroid Function Tests: No results for input(s): TSH, T4TOTAL, FREET4, T3FREE, THYROIDAB in the last 72 hours. Anemia Panel: No results for input(s): VITAMINB12, FOLATE, FERRITIN, TIBC, IRON, RETICCTPCT in the last 72 hours. Urine analysis:    Component Value Date/Time   COLORURINE  YELLOW 04/26/2017 2259   APPEARANCEUR HAZY (A) 04/26/2017 2259   LABSPEC 1.009 04/26/2017 2259   PHURINE 8.0 04/26/2017 2259   GLUCOSEU NEGATIVE 04/26/2017 2259   HGBUR NEGATIVE 04/26/2017 2259   BILIRUBINUR NEGATIVE 04/26/2017 2259   KETONESUR NEGATIVE 04/26/2017 2259   PROTEINUR NEGATIVE 04/26/2017 2259   UROBILINOGEN 1.0 04/02/2014 2339   NITRITE NEGATIVE 04/26/2017 2259   LEUKOCYTESUR NEGATIVE 04/26/2017 2259   Sepsis Labs: @LABRCNTIP (procalcitonin:4,lacticidven:4)  )No results found for this or any previous visit (from the past 240 hour(s)).   Invalid input(s): PROCALCITONIN, LACTICACIDVEN   Radiology Studies: Dg Shoulder Right  Result Date: 04/26/2017 CLINICAL DATA:  Fall, right shoulder pain EXAM: RIGHT SHOULDER - 2+ VIEW COMPARISON:  None. FINDINGS: No fracture or dislocation is seen. The joint spaces are preserved. Visualized soft tissues are within normal limits. Visualized right lung is clear. IMPRESSION: Negative. Electronically Signed   By: Bertis Ruddy  [place] Pink M.D.   On: 04/26/2017 13:01   Ct Head Wo Contrast  Result Date: 04/26/2017 CLINICAL DATA:  Unsteady gait and multiple falls over the past month. The patient most recently fell last night. EXAM: CT HEAD WITHOUT CONTRAST TECHNIQUE: Contiguous axial images were obtained from the base of the skull through the vertex without intravenous contrast. COMPARISON:  Head CT scan 04/03/2014. FINDINGS: Brain: No evidence of acute abnormality including hemorrhage, infarct, mass lesion, mass effect, midline shift or abnormal extra-axial fluid collection. No hydrocephalus or pneumocephalus. Mild atrophy and chronic microvascular ischemic change appear age appropriate. Vascular: Atherosclerosis noted. Skull: Intact. Sinuses/Orbits: Negative. Other: None. IMPRESSION: No acute abnormality. Atherosclerosis. Electronically Signed   By: Inge Rise M.D.   On: 04/26/2017 14:53   Dg Chest Port 1 View  Result Date: 04/26/2017 CLINICAL  DATA:  Left upper chest soreness. Patient reports recent fall. EXAM: PORTABLE CHEST 1 VIEW COMPARISON:  PA and lateral chest 04/11/2016 and 02/07/2016. CT chest 02/14/2016. FINDINGS: The patient is status post mitral valve replacement. There is cardiomegaly without edema. Chronic left basilar opacity is unchanged. The right lung is clear. Aortic atherosclerosis is noted. No acute bony abnormality. IMPRESSION: Cardiomegaly without acute disease. Electronically Signed   By: Inge Rise M.D.   On: 04/26/2017 13:41        Scheduled Meds: . amiodarone  200 mg Oral Daily  . B-complex with vitamin C  1 tablet Oral Daily  . cycloSPORINE  1 drop Both Eyes BID  . diltiazem  300 mg Oral Daily  . furosemide  80 mg Intravenous Once  . gabapentin  300 mg Oral BID  . mirabegron ER  50 mg Oral Daily  . multivitamin with minerals  1 tablet Oral QPM  . potassium chloride SA  40 mEq Oral BID  . rosuvastatin  5 mg Oral q1800  . sodium chloride flush  3 mL Intravenous Q12H  . traZODone  300 mg Oral QHS  . vitamin C  500 mg Oral BID   Continuous Infusions: . sodium chloride       LOS: 2 days    Time spent: 35 minutes    Dandrea Medders A, MD [facility] Pager [phone]  If 7PM-7AM, please contact night-coverage [email or web address] Password [facility] 04/28/2017, 11:51 AM

## 2017-04-29 ENCOUNTER — Encounter (HOSPITAL_COMMUNITY)
Admission: EM | Disposition: A | Discharge status: Skilled Nursing Facility | Payer: Self-pay | Source: Home / Self Care | Attending: Internal Medicine

## 2017-04-29 ENCOUNTER — Encounter (HOSPITAL_COMMUNITY): Payer: Self-pay | Admitting: Internal Medicine

## 2017-04-29 DIAGNOSIS — E876 Hypokalemia: Secondary | ICD-10-CM

## 2017-04-29 DIAGNOSIS — N179 Acute kidney failure, unspecified: Secondary | ICD-10-CM

## 2017-04-29 DIAGNOSIS — L8990 Pressure ulcer of unspecified site, unspecified stage: Secondary | ICD-10-CM | POA: Diagnosis present

## 2017-04-29 HISTORY — PX: RIGHT HEART CATH: CATH118263

## 2017-04-29 LAB — POCT I-STAT 3, VENOUS BLOOD GAS (G3P V)
ACID-BASE EXCESS: 15 mmol/L — AB (ref 0.0–2.0)
ACID-BASE EXCESS: 17 mmol/L — AB (ref 0.0–2.0)
Acid-Base Excess: 17 mmol/L — ABNORMAL HIGH (ref 0.0–2.0)
BICARBONATE: 39.8 mmol/L — AB (ref 20.0–28.0)
BICARBONATE: 41.9 mmol/L — AB (ref 20.0–28.0)
BICARBONATE: 42.4 mmol/L — AB (ref 20.0–28.0)
O2 SAT: 69 %
O2 SAT: 69 %
O2 SAT: 72 %
PCO2 VEN: 53.1 mmHg (ref 44.0–60.0)
PH VEN: 7.505 — AB (ref 7.250–7.430)
PO2 VEN: 34 mmHg (ref 32.0–45.0)
PO2 VEN: 36 mmHg (ref 32.0–45.0)
TCO2: 41 mmol/L (ref 0–100)
TCO2: 44 mmol/L (ref 0–100)
TCO2: 44 mmol/L (ref 0–100)
pCO2, Ven: 52.4 mmHg (ref 44.0–60.0)
pCO2, Ven: 53.8 mmHg (ref 44.0–60.0)
pH, Ven: 7.489 — ABNORMAL HIGH (ref 7.250–7.430)
pH, Ven: 7.505 — ABNORMAL HIGH (ref 7.250–7.430)
pO2, Ven: 34 mmHg (ref 32.0–45.0)

## 2017-04-29 LAB — CBC
HCT: 30.8 % — ABNORMAL LOW (ref 39.0–52.0)
HEMOGLOBIN: 9.1 g/dL — AB (ref 13.0–17.0)
MCH: 25.3 pg — ABNORMAL LOW (ref 26.0–34.0)
MCHC: 29.5 g/dL — ABNORMAL LOW (ref 30.0–36.0)
MCV: 85.6 fL (ref 78.0–100.0)
Platelets: 66 10*3/uL — ABNORMAL LOW (ref 150–400)
RBC: 3.6 MIL/uL — AB (ref 4.22–5.81)
RDW: 16.8 % — ABNORMAL HIGH (ref 11.5–15.5)
WBC: 9.2 10*3/uL (ref 4.0–10.5)

## 2017-04-29 LAB — BASIC METABOLIC PANEL
Anion gap: 11 (ref 5–15)
BUN: 40 mg/dL — AB (ref 6–20)
CHLORIDE: 98 mmol/L — AB (ref 101–111)
CO2: 35 mmol/L — ABNORMAL HIGH (ref 22–32)
Calcium: 8.6 mg/dL — ABNORMAL LOW (ref 8.9–10.3)
Creatinine, Ser: 1.99 mg/dL — ABNORMAL HIGH (ref 0.61–1.24)
GFR calc Af Amer: 34 mL/min — ABNORMAL LOW (ref >60)
GFR calc non Af Amer: 29 mL/min — ABNORMAL LOW (ref >60)
GLUCOSE: 133 mg/dL — AB (ref 65–99)
POTASSIUM: 3.1 mmol/L — AB (ref 3.5–5.1)
Sodium: 144 mmol/L (ref 135–145)

## 2017-04-29 LAB — PROTIME-INR
INR: 2.37
Prothrombin Time: 26.3 seconds — ABNORMAL HIGH (ref 11.4–15.2)

## 2017-04-29 SURGERY — RIGHT HEART CATH
Anesthesia: LOCAL

## 2017-04-29 MED ORDER — POTASSIUM CHLORIDE CRYS ER 20 MEQ PO TBCR
40.0000 meq | EXTENDED_RELEASE_TABLET | Freq: Two times a day (BID) | ORAL | Status: DC
  Administered 2017-04-29 – 2017-04-30 (×3): 40 meq via ORAL
  Filled 2017-04-29 (×3): qty 2

## 2017-04-29 MED ORDER — ONDANSETRON HCL 4 MG/2ML IJ SOLN: 4.0000 mg | Freq: Four times a day (QID) | INTRAMUSCULAR | Status: DC | PRN

## 2017-04-29 MED ORDER — HEPARIN (PORCINE) IN NACL 2-0.9 UNIT/ML-% IJ SOLN
INTRAMUSCULAR | Status: AC | PRN
  Administered 2017-04-29: 500 mL

## 2017-04-29 MED ORDER — LIDOCAINE HCL 1 % IJ SOLN
INTRAMUSCULAR | Status: AC
  Filled 2017-04-29: qty 20

## 2017-04-29 MED ORDER — ACETAMINOPHEN 325 MG PO TABS: 650.0000 mg | ORAL_TABLET | ORAL | Status: DC | PRN

## 2017-04-29 MED ORDER — SODIUM CHLORIDE 0.9% FLUSH: 3.0000 mL | INTRAVENOUS | Status: DC | PRN

## 2017-04-29 MED ORDER — COLLAGENASE 250 UNIT/GM EX OINT
TOPICAL_OINTMENT | Freq: Every day | CUTANEOUS | Status: DC
  Administered 2017-04-29 – 2017-04-30 (×2): via TOPICAL
  Filled 2017-04-29: qty 30

## 2017-04-29 MED ORDER — SODIUM CHLORIDE 0.9% FLUSH
3.0000 mL | Freq: Two times a day (BID) | INTRAVENOUS | Status: DC
  Administered 2017-04-29 – 2017-04-30 (×3): 3 mL via INTRAVENOUS

## 2017-04-29 MED ORDER — HEPARIN (PORCINE) IN NACL 2-0.9 UNIT/ML-% IJ SOLN
INTRAMUSCULAR | Status: AC
  Filled 2017-04-29: qty 500

## 2017-04-29 MED ORDER — SODIUM CHLORIDE 0.9 % IV SOLN
250.0000 mL | INTRAVENOUS | Status: DC | PRN
Start: 2017-04-29 — End: 2017-04-30

## 2017-04-29 MED ORDER — SODIUM CHLORIDE 0.9 % IV SOLN
INTRAVENOUS | Status: AC
  Administered 2017-04-29: 12:00:00 via INTRAVENOUS

## 2017-04-29 MED ORDER — WARFARIN SODIUM 2 MG PO TABS
4.0000 mg | ORAL_TABLET | Freq: Once | ORAL | Status: AC
  Administered 2017-04-29: 4 mg via ORAL
  Filled 2017-04-29: qty 2

## 2017-04-29 MED ORDER — LIDOCAINE HCL (PF) 1 % IJ SOLN
INTRAMUSCULAR | Status: DC | PRN
  Administered 2017-04-29: 2 mL

## 2017-04-29 SURGICAL SUPPLY — 10 items
CATH BALLN WEDGE 5F 110CM (CATHETERS) ×1 IMPLANT
GLIDESHEATH INTRODUCER 5F 10CM (SHEATH) ×1 IMPLANT
GUIDEWIRE .025 260CM (WIRE) ×1 IMPLANT
KIT HEART LEFT (KITS) ×2 IMPLANT
PACK CARDIAC CATHETERIZATION (CUSTOM PROCEDURE TRAY) ×2 IMPLANT
PROTECTION STATION PRESSURIZED (MISCELLANEOUS) ×2
STATION PROTECTION PRESSURIZED (MISCELLANEOUS) IMPLANT
TRANSDUCER W/STOPCOCK (MISCELLANEOUS) ×2 IMPLANT
TUBING ART PRESS 72  MALE/FEM (TUBING) ×1
TUBING ART PRESS 72 MALE/FEM (TUBING) IMPLANT

## 2017-04-29 NOTE — Progress Notes (Signed)
Advanced Heart Failure Rounding Note  PCP: Dr. Charlett Blake Primary Cardiologist: Dr. Jose Persia. [place]   Subjective:    Admitted 04/26/17 with fall. Volume overloaded, started on IV lasix. Weight down ~ 10 pounds.    Creatinine 1.89->1.91->1.55->1.79->1.99. Feels well today, denies SOB and chest pain.   Objective:   Weight Range: 190 lb (86.2 kg) Body mass index is 23.13 kg/m.   Vital Signs:   Temp:  [98.2 F (36.8 C)-98.4 F (36.9 C)] 98.4 F (36.9 C) (06/13 0627) Pulse Rate:  [66-71] 68 (06/13 0627) Resp:  [16-18] 18 (06/13 0627) BP: (102-108)/(41-60) 102/41 (06/13 0627) SpO2:  [91 %-94 %] 94 % (06/13 0627) Weight:  [190 lb (86.2 kg)] 190 lb (86.2 kg) (06/13 0627) Last BM Date: 04/28/17  Weight change: Filed Weights   04/27/17 0500 04/28/17 0409 04/29/17 0627  Weight: 191 lb (86.6 kg) 193 lb (87.5 kg) 190 lb (86.2 kg)    Intake/Output:   Intake/Output Summary (Last 24 hours) at 04/29/17 0758 Last data filed at 04/29/17 0630  Gross per 24 hour  Intake              825 ml  Output             2800 ml  Net            -1975 ml     Physical Exam: General: Elderly male, lying in bed.  HEENT: normal Neck: supple. JVP 6-7 cm. Carotids 2+ bilat; no bruits. No thyromegaly or nodule noted. Cor: PMI nondisplaced. Regular rate and rhythm. 2/6 TR murmur.  Lungs: CTAB, normal effort. Abdomen: soft, non-tender, distended, no HSM. No bruits or masses. +BS  Extremities: no cyanosis, clubbing, rash, +1 edema to knees, UNNA boots in place.  Neuro: alert & orientedx3, cranial nerves grossly intact. moves all 4 extremities w/o difficulty. Affect pleasant    Telemetry: NSR  Labs: CBC  Recent Labs  04/26/17 1347 04/29/17 0245  WBC 6.9 9.2  NEUTROABS 3.9  --   HGB 10.2* 9.1*  HCT 34.3* 30.8*  MCV 85.3 85.6  PLT 64* 66*   Basic Metabolic Panel  Recent Labs  04/28/17 0601 04/29/17 0245  NA 146* 144  K 3.5 3.1*  CL 102 98*  CO2 36* 35*  GLUCOSE 114* 133*   BUN 31* 40*  CREATININE 1.79* 1.99*  CALCIUM 9.1 8.6*  MG 2.5*  --    Liver Function Tests  Recent Labs  04/26/17 1347  AST 26  ALT 24  ALKPHOS 55  BILITOT 1.9*  PROT 7.0  ALBUMIN 3.4*    Recent Labs  04/26/17 1347  LIPASE 37    Transthoracic Echocardiography 12/19/16 Study Conclusions  - Left ventricle: The cavity size was normal. Wall thickness was   increased in a pattern of mild LVH. Systolic function was normal.   The estimated ejection fraction was in the range of 50% to 55%. - Aortic valve: There was mild regurgitation. - Mitral valve: Post mitral valve repair with just mild residual   MR. - Left atrium: The atrium was moderately dilated. - Right ventricle: The cavity size was moderately dilated. - Right atrium: The atrium was severely dilated. - Atrial septum: No defect or patent foramen ovale was identified. - Tricuspid valve: There was severe regurgitation       Medications:     Scheduled Medications: . amiodarone  200 mg Oral Daily  . B-complex with vitamin C  1 tablet Oral Daily  . cycloSPORINE  1 drop  Both Eyes BID  . diltiazem  300 mg Oral Daily  . gabapentin  300 mg Oral BID  . mirabegron ER  50 mg Oral Daily  . multivitamin with minerals  1 tablet Oral QPM  . potassium chloride SA  40 mEq Oral Daily  . rosuvastatin  5 mg Oral q1800  . sodium chloride flush  3 mL Intravenous Q12H  . traZODone  300 mg Oral QHS  . vitamin C  500 mg Oral BID  . Warfarin - Pharmacist Dosing Inpatient   Does not apply q1800    Infusions: . sodium chloride    . sodium chloride 10 mL/hr at 04/29/17 0754    PRN Medications: sodium chloride, acetaminophen, ondansetron (ZOFRAN) IV, polyethylene glycol, sodium chloride flush   Assessment/Plan   1. Fall: Denies syncope, remembers falling.  - PT/OT recommending SNF.  2. Acute on chronic diastolic CHF: Echo 04/7013 with EF 50-55%, mild MR, RV moderately dilated, severe TR.  - NYHA IV - Volume status  improving.  - Continue to hold Lasix today.  - No beta blocker  - RHC today.  3. 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  - Will assess with RHC.  4. S/p mitral valve repair - valve repair functioning normally by last Echo - Continue current medical management.  5. History of PAF - NSR today.  - Continue Amiodarone 200 mg daily.  - Continue diltiazem 300 mg daily.  - This patients CHA2DS2-VASc Score and unadjusted Ischemic Stroke Rate (% per year) is equal to 4.8 % stroke rate/year from a score of 4 Above score calculated as 1 point each if present [CHF, HTN, DM, Vascular=MI/PAD/Aortic Plaque, Age if 65-74, or Male], 2 points each if present [Age > 75, or Stroke/TIA/TE] - Continue warfarin per pharmacy. INR 2.37 6. Hypokalemia - K 3.1, will increase KCl to 26mEq BID.    Length of Stay: [place], NP  04/29/2017, 7:58 AM  Advanced Heart Failure Team Pager [phone] (M-F; 7a - 4p)  Please contact [facility] for night-coverage after hours (4p -7a ) and weekends on [email or web address]   Patient seen and examined with [name], NP. We discussed all aspects of the encounter. I agree with the assessment and plan as stated above.   Volume status much improved this am. Creatinine up though. Will proceed with RHC today. Maintaining NSR. Continue amio and warfarin. INR 2.37  Hopefully we can get him home when renal function stabilizes. Will likely d/c on torsemide 40 daily with 20mg  extra as needed in afternoon.   [name], MD  10:06 AM

## 2017-04-29 NOTE — H&P (View-Only) (Signed)
Advanced Heart Failure Rounding Note  PCP: Dr. Charlett Blake Primary Cardiologist: Dr. Jose Persia. [name] [name]   Subjective:    Admitted 04/26/17 with fall. Volume overloaded, started on IV lasix. Weight down ~ 10 pounds.    Creatinine 1.89->1.91->1.55->1.79->1.99. Feels well today, denies SOB and chest pain.   Objective:   Weight Range: 190 lb (86.2 kg) Body mass index is 23.13 kg/m.   Vital Signs:   Temp:  [98.2 F (36.8 C)-98.4 F (36.9 C)] 98.4 F (36.9 C) (06/13 0627) Pulse Rate:  [66-71] 68 (06/13 0627) Resp:  [16-18] 18 (06/13 0627) BP: (102-108)/(41-60) 102/41 (06/13 0627) SpO2:  [91 %-94 %] 94 % (06/13 0627) Weight:  [190 lb (86.2 kg)] 190 lb (86.2 kg) (06/13 0627) Last BM Date: 04/28/17  Weight change: Filed Weights   04/27/17 0500 04/28/17 0409 04/29/17 0627  Weight: 191 lb (86.6 kg) 193 lb (87.5 kg) 190 lb (86.2 kg)    Intake/Output:   Intake/Output Summary (Last 24 hours) at 04/29/17 0758 Last data filed at 04/29/17 0630  Gross per 24 hour  Intake              825 ml  Output             2800 ml  Net            -1975 ml     Physical Exam: General: Elderly male, lying in bed.  HEENT: normal Neck: supple. JVP 6-7 cm. Carotids 2+ bilat; no bruits. No thyromegaly or nodule noted. Cor: PMI nondisplaced. Regular rate and rhythm. 2/6 TR murmur.  Lungs: CTAB, normal effort. Abdomen: soft, non-tender, distended, no HSM. No bruits or masses. +BS  Extremities: no cyanosis, clubbing, rash, +1 edema to knees, UNNA boots in place.  Neuro: alert & orientedx3, cranial nerves grossly intact. moves all 4 extremities w/o difficulty. Affect pleasant    Telemetry: NSR  Labs: CBC  Recent Labs  04/26/17 1347 04/29/17 0245  WBC 6.9 9.2  NEUTROABS 3.9  --   HGB 10.2* 9.1*  HCT 34.3* 30.8*  MCV 85.3 85.6  PLT 64* 66*   Basic Metabolic Panel  Recent Labs  04/28/17 0601 04/29/17 0245  NA 146* 144  K 3.5 3.1*  CL 102 98*  CO2 36* 35*  GLUCOSE 114* 133*   BUN 31* 40*  CREATININE 1.79* 1.99*  CALCIUM 9.1 8.6*  MG 2.5*  --    Liver Function Tests  Recent Labs  04/26/17 1347  AST 26  ALT 24  ALKPHOS 55  BILITOT 1.9*  PROT 7.0  ALBUMIN 3.4*    Recent Labs  04/26/17 1347  LIPASE 37    Transthoracic Echocardiography 12/19/16 Study Conclusions  - Left ventricle: The cavity size was normal. Wall thickness was   increased in a pattern of mild LVH. Systolic function was normal.   The estimated ejection fraction was in the range of 50% to 55%. - Aortic valve: There was mild regurgitation. - Mitral valve: Post mitral valve repair with just mild residual   MR. - Left atrium: The atrium was moderately dilated. - Right ventricle: The cavity size was moderately dilated. - Right atrium: The atrium was severely dilated. - Atrial septum: No defect or patent foramen ovale was identified. - Tricuspid valve: There was severe regurgitation       Medications:     Scheduled Medications: . amiodarone  200 mg Oral Daily  . B-complex with vitamin C  1 tablet Oral Daily  . cycloSPORINE  1 drop  Both Eyes BID  . diltiazem  300 mg Oral Daily  . gabapentin  300 mg Oral BID  . mirabegron ER  50 mg Oral Daily  . multivitamin with minerals  1 tablet Oral QPM  . potassium chloride SA  40 mEq Oral Daily  . rosuvastatin  5 mg Oral q1800  . sodium chloride flush  3 mL Intravenous Q12H  . traZODone  300 mg Oral QHS  . vitamin C  500 mg Oral BID  . Warfarin - Pharmacist Dosing Inpatient   Does not apply q1800    Infusions: . sodium chloride    . sodium chloride 10 mL/hr at 04/29/17 0754    PRN Medications: sodium chloride, acetaminophen, ondansetron (ZOFRAN) IV, polyethylene glycol, sodium chloride flush   Assessment/Plan   1. Fall: Denies syncope, remembers falling.  - PT/OT recommending SNF.  2. Acute on chronic diastolic CHF: Echo 11/6008 with EF 50-55%, mild MR, RV moderately dilated, severe TR.  - NYHA IV - Volume status  improving.  - Continue to hold Lasix today.  - No beta blocker  - RHC today.  3. 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  - Will assess with RHC.  4. S/p mitral valve repair - valve repair functioning normally by last Echo - Continue current medical management.  5. History of PAF - NSR today.  - Continue Amiodarone 200 mg daily.  - Continue diltiazem 300 mg daily.  - This patients CHA2DS2-VASc Score and unadjusted Ischemic Stroke Rate (% per year) is equal to 4.8 % stroke rate/year from a score of 4 Above score calculated as 1 point each if present [CHF, HTN, DM, Vascular=MI/PAD/Aortic Plaque, Age if 65-74, or Male], 2 points each if present [Age > 75, or Stroke/TIA/TE] - Continue warfarin per pharmacy. INR 2.37 6. Hypokalemia - K 3.1, will increase KCl to 70mEq BID.    Length of Stay: [name], NP  04/29/2017, 7:58 AM  Advanced Heart Failure Team Pager [phone] (M-F; 7a - 4p)  Please contact [name] for night-coverage after hours (4p -7a ) and weekends on [email or web address]   Patient seen and examined with [name], NP. We discussed all aspects of the encounter. I agree with the assessment and plan as stated above.   Volume status much improved this am. Creatinine up though. Will proceed with RHC today. Maintaining NSR. Continue amio and warfarin. INR 2.37  Hopefully we can get him home when renal function stabilizes. Will likely d/c on torsemide 40 daily with 20mg  extra as needed in afternoon.   [name], MD  10:06 AM

## 2017-04-29 NOTE — Clinical Social Work Note (Signed)
Clinical Social Work Assessment  Patient Details  Name: [name] [name] MRN: [id number] Date of Birth: 1931-03-14  Date of referral:  04/29/17               Reason for consult:  Facility Placement, Discharge Planning                Permission sought to share information with:  Facility Sport and exercise psychologist, Family Supports Permission granted to share information::  Yes, Verbal Permission Granted  Name::     Anna Genre  Agency::  [name]  Relationship::  Daughter  Contact Information:  [phone]  Housing/Transportation Living arrangements for the past 2 months:  Single Family Home Source of Information:  Patient, Medical Team, Adult Children Patient Interpreter Needed:  None Criminal Activity/Legal Involvement Pertinent to Current Situation/Hospitalization:  No - Comment as needed Significant Relationships:  Adult Children Lives with:  Adult Children Do you feel safe going back to the place where you live?  Yes Need for family participation in patient care:  Yes (Comment)  Care giving concerns:  PT recommending SNF once medically stable for discharge.   Social Worker assessment / plan:  CSW met with patient. Daughter at bedside. CSW introduced role and explained that PT recommendations would be discussed. Patient and his daughter are agreeable to SNF placement. [name]. [name] able to extend a bed offer and can take him tomorrow if stable. Patient's daughter will call the admissions coordinator regarding a time to do paperwork. No further concerns. CSW encouraged patient and his daughter to contact CSW as needed. CSW will continue to follow patient and his daughter for support and facilitate discharge once medically stable.  Employment status:  Retired Nurse, adult PT Recommendations:  [place] / Referral to community resources:  [place]  Patient/Family's Response to  care:  Patient and his daughter agreeable to SNF placement. Patient's daughter supportive and involved in patient's care. Patient and his daughter appreciated social work intervention.  Patient/Family's Understanding of and Emotional Response to Diagnosis, Current Treatment, and Prognosis:  Patient and his daughter have a good understanding of the reason for admission and his need for rehab prior to returning home. Patient and his daughter appear happy with hospital care.  Emotional Assessment Appearance:  Appears stated age Attitude/Demeanor/Rapport:  Other (Pleasant) Affect (typically observed):  Accepting, Appropriate, Calm, Pleasant Orientation:  Oriented to Self, Oriented to Place, Oriented to  Time, Oriented to Situation Alcohol / Substance use:  Never Used Psych involvement (Current and /or in the community):  No (Comment)  Discharge Needs  Concerns to be addressed:  Care Coordination Readmission within the last 30 days:  No Current discharge risk:  Dependent with Mobility Barriers to Discharge:  Continued Medical Work up   Candie Chroman, LCSW 04/29/2017, 1:28 PM

## 2017-04-29 NOTE — Interval H&P Note (Signed)
History and Physical Interval Note:  04/29/2017 10:08 AM  [name] [name]  has presented today for surgery, with the diagnosis of hf  The various methods of treatment have been discussed with the patient and family. After consideration of risks, benefits and other options for treatment, the patient has consented to  Procedure(s): Right Heart Cath (N/A) as a surgical intervention .  The patient's history has been reviewed, patient examined, no change in status, stable for surgery.  I have reviewed the patient's chart and labs.  Questions were answered to the patient's satisfaction.     Bensimhon, Quillian Quince

## 2017-04-29 NOTE — Progress Notes (Signed)
ANTICOAGULATION CONSULT NOTE - Follow Up Consult  Pharmacy Consult for Coumadin Indication: atrial fibrillation  No Known Allergies  Patient Measurements: Height: 6\' 4"  (193 cm) Weight: 190 lb (86.2 kg) IBW/kg (Calculated) : 86.8   Vital Signs: Temp: 98.6 F (37 C) (06/13 1051) Temp Source: Oral (06/13 1051) BP: 113/37 (06/13 1051) Pulse Rate: 70 (06/13 1051)  Labs:  Recent Labs  04/26/17 1347 04/27/17 0255 04/27/17 1039 04/28/17 0601 04/29/17 0245  HGB 10.2*  --   --   --  9.1*  HCT 34.3*  --   --   --  30.8*  PLT 64*  --   --   --  66*  LABPROT 38.2*  --  39.4* 31.1* 26.3*  INR 3.77  --  3.92 2.91 2.37  CREATININE 1.91* 1.55*  --  1.79* 1.99*    Estimated Creatinine Clearance: 33.7 mL/min (A) (by C-G formula based on SCr of 1.99 mg/dL (H)).  Assessment: 86yof on coumadin pta for afib/flutter, admitted with supratherapeutic INR. Doses held 6/10 and 6/11, resumed yesterday with INR 2.91. INR 2.37 today and trending down. Multiple bruises and frequent falls pta but continuing coumadin since patient going to SNF at discharge. Continues on amiodarone as pta.   PTA dose: 2.5mg  daily - last taken 6/9  Goal of Therapy:  INR 2-3 Monitor platelets by anticoagulation protocol: Yes   Plan:  1) Coumadin 4mg  tonight 2) Daily INR  Deboraha Sprang 04/29/2017,11:06 AM

## 2017-04-29 NOTE — Consult Note (Addendum)
[place] Nurse wound follow-up consult note Reason for Consult: Compression wraps changed.  All previous wounds to bilat legs have healed at this time and there are no further open wounds or drainage. Dressing procedure/placement/frequency: Applied kerlex and coban to BLE for compression.  Plan to change Fri if patient is still in the hospital at that time.  He can resume follow-up with the outpatient wound care center after discharge. Requested to assess buttock wound.  Pt states there was "a sore place" present prior to the hospital stay, but it appears to have declined.  4X2X.1cm, 80% dark purple-reddish, evolving into 20% slough in the middle of the wound.  Appearance consistent with a previous deep tissue injury which has evolved into an unstageable pressure injury.  Mod amt tan drainage with slight odor. There is a pink dry raised lesion located nearby, wart-like in appearance; pt states "that has been there awhile." Santyl ointment to provide enzymatic debridement of nonviable tissue to buttock wound. Discussed plan of care with patient and they verbalized understanding. Please re-consult if further assistance is needed.  Thank-you,  Julien Girt MSN, [place], [place], [place], [place]

## 2017-04-29 NOTE — CV Procedure (Signed)
RHC results  Findings:  RA = 8 RV = 36/7 PA = 33/4 (15) PCW = 6 Fick cardiac output/index = 7.7/3.6 PVR = 1.2 WU Ao sat = 99% PA sat = 69%, 72% SVC sat = 69%  Assessment:  Well compensated filling pressures with high cardiac output.  No evidence of PAH.  Mildly elevated RA pressure in setting of normal PCWP suggests mild RV failure  Plan/Discussion:  Will give some fluid back today. Continue to hold diuretics. If creatinine stable in am can go to SNF in am.  [name], MD  10:37 AM

## 2017-04-29 NOTE — Clinical Social Work Placement (Signed)
   CLINICAL SOCIAL WORK PLACEMENT  NOTE  Date:  04/29/2017  Patient Details  Name: [name] [name] MRN: [id number] Date of Birth: 1930-12-13  Clinical Social Work is seeking post-discharge placement for this patient at the [place] level of care (*CSW will initial, date and re-position this form in  chart as items are completed):  Yes   Patient/family provided with [facility] Work Department's list of facilities offering this level of care within the geographic area requested by the patient (or if unable, by the patient's family).  Yes   Patient/family informed of their freedom to choose among providers that offer the needed level of care, that participate in Medicare, Medicaid or managed care program needed by the patient, have an available bed and are willing to accept the patient.  Yes   Patient/family informed of [facility]'s ownership interest in [facility] and [facility], as well as of the fact that they are under no obligation to receive care at these facilities.  PASRR submitted to EDS on 04/29/17     PASRR number received on       Existing PASRR number confirmed on 04/29/17     FL2 transmitted to all facilities in geographic area requested by pt/family on 04/29/17     FL2 transmitted to all facilities within larger geographic area on       Patient informed that his/her managed care company has contracts with or will negotiate with certain facilities, including the following:        Yes   Patient/family informed of bed offers received.  Patient [facility] bed at [facility]     Physician recommends and patient chooses bed at      Patient to be transferred to [facility] on  .  Patient to be transferred to facility by       Patient family notified on   of transfer.  Name of family member notified:        PHYSICIAN Please sign FL2     Additional Comment:    _______________________________________________ Candie Chroman, LCSW 04/29/2017, 1:33 PM

## 2017-04-29 NOTE — Progress Notes (Signed)
OT Cancellation Note  Patient Details Name: [name] [name] MRN: [id number] DOB: Apr 13, 1931   Cancelled Treatment:    Reason Eval/Treat Not Completed: Patient at procedure or test/ unavailable.  Will reattempt.  [place], OTR/L [phone]   [name] M 04/29/2017, 10:09 AM

## 2017-04-29 NOTE — NC FL2 (Signed)
[place] LEVEL OF CARE SCREENING TOOL     IDENTIFICATION  Patient Name: [name] [name] Birthdate: 12-Feb-1931 Sex: male Admission Date (Current Location): 04/26/2017  [facility] and [place] Number:  Herbalist and Address:  [facility]. [facility] [place] [address] [place], [place] [address]      Provider Number: [id number]  Attending Physician Name and Address:  Louellen Molder, MD  Relative Name and Phone Number:       Current Level of Care: Hospital Recommended Level of Care: [place] Prior Approval Number:    Date Approved/Denied:   PASRR Number: [id number]  Discharge Plan: SNF    Current Diagnoses: Patient Active Problem List   Diagnosis Date Noted  . Pressure injury of skin 04/29/2017  . Acute on chronic diastolic (congestive) heart failure ([place])   . PAH (pulmonary artery hypertension) ([place])   . Right-sided heart failure ([place]) 04/26/2017  . Chronic diastolic heart failure ([place]) 04/21/2017  . Pulmonary hypertension, primary ([place]) 04/21/2017  . Severe tricuspid regurgitation 03/12/2017  . Bilateral lower extremity edema 02/25/2017  . Anticoagulated 02/25/2017  . Varicose veins of right lower extremity with complications 56/21/3086  . Obstructive lung disease (generalized) ([place]) 09/26/2016  . Carpal tunnel syndrome 06/25/2016  . Hereditary and idiopathic peripheral neuropathy 06/25/2016  . Paresthesia 05/27/2016  . Abnormality of gait 05/27/2016  . Chest x-ray abnormality 01/09/2016  . Paroxysmal atrial flutter ([place]) 12/05/2015  . Constipation 06/13/2015  . Encounter for therapeutic drug monitoring 05/25/2014  . Syncope 04/03/2014  . Rhabdomyolysis 04/03/2014  . UTI (urinary tract infection) 04/03/2014  . Acute renal failure ([place]) 04/03/2014  . Leukocytosis 04/03/2014  . Internal nasal lesion 05/15/2013  . Low back pain 04/19/2013  . Melanoma ([place]) 09/30/2012  . Cough 03/26/2012  . Hx of mitral valve  repair 11/19/2011  . Long term (current) use of anticoagulants 11/03/2011  . Decubitus skin ulcer 10/28/2011  . Pleural effusion due to congestive heart failure ([place]) 10/28/2011  . Atrial fibrillation ([place]) 10/07/2011  . S/P mitral valve repair 10/01/2011  . Valvular heart disease 08/21/2011  . Cerumen impaction 04/09/2011  . Hearing loss 04/09/2011  . THROMBOCYTOPENIA 09/19/2010  . ADENOCARCINOMA, PROSTATE 09/17/2010  . CALLUS, LEFT FOOT 09/17/2010  . BACK PAIN, CHRONIC 09/17/2010  . TINEA PEDIS 05/28/2009  . DERMATITIS, ATOPIC 04/10/2009  . HYPERCHOLESTEROLEMIA 06/09/2008  . DEPRESSION 06/09/2008  . HYPERTENSION, BENIGN ESSENTIAL 06/09/2008  . GERD 06/09/2008  . OSTEOARTHRITIS, GENERALIZED, MULTIPLE JOINTS 06/09/2008  . MUSCLE SPASM, BACK 06/09/2008  . PERSONAL HISTORY MALIGNANT NEOPLASM PROSTATE 06/09/2008  . PERSONAL HISTORY OF MALIGNANT MELANOMA OF SKIN 06/09/2008  . ARRHYTHMIA, HX OF 06/09/2008  . Personal history of colonic polyps 06/09/2008    Orientation RESPIRATION BLADDER Height & Weight     Self, Time, Situation, Place  Normal Incontinent, External catheter Weight: 190 lb (86.2 kg) Height:  6\' 4"  (193 cm)  BEHAVIORAL SYMPTOMS/MOOD NEUROLOGICAL BOWEL NUTRITION STATUS   (None)  (None) Incontinent Diet (Heart healthy)  AMBULATORY STATUS COMMUNICATION OF NEEDS Skin   Extensive Assist Verbally Skin abrasions, Bruising, Other (Comment) (Skin tear. Unstageable pressure injury: Buttocks (Foam prn).)                       Personal Care Assistance Level of Assistance  Bathing, Feeding, Dressing Bathing Assistance: Maximum assistance Feeding assistance: Independent Dressing Assistance: Maximum assistance     Functional Limitations Info  Sight, Hearing, Speech Sight Info: Adequate Hearing Info: Adequate  Speech Info: Adequate    SPECIAL CARE FACTORS FREQUENCY  PT (By licensed PT), Blood pressure, OT (By licensed OT)     PT Frequency: 5 x week OT Frequency: 5  x week            Contractures Contractures Info: Not present    Additional Factors Info  Code Status, Allergies, Psychotropic Code Status Info: Full Allergies Info: NKDA Psychotropic Info: Depression: Trazodone 300 mg PO QHS         Current Medications (04/29/2017):  This is the current hospital active medication list Current Facility-Administered Medications  Medication Dose Route Frequency Provider Last Rate Last Dose  . 0.9 %  sodium chloride infusion  250 mL Intravenous PRN Velvet Bathe, MD      . 0.9 %  sodium chloride infusion   Intravenous Continuous Bensimhon, Shaune Pascal, MD 50 mL/hr at 04/29/17 1145    . 0.9 %  sodium chloride infusion  250 mL Intravenous PRN Bensimhon, Shaune Pascal, MD      . acetaminophen (TYLENOL) tablet 650 mg  650 mg Oral Q4H PRN Bensimhon, Shaune Pascal, MD      . amiodarone (PACERONE) tablet 200 mg  200 mg Oral Daily Velvet Bathe, MD   200 mg at 04/29/17 1000  . B-complex with vitamin C tablet 1 tablet  1 tablet Oral Daily Velvet Bathe, MD   1 tablet at 04/29/17 1000  . collagenase (SANTYL) ointment   Topical Daily Dhungel, Nishant, MD      . cycloSPORINE (RESTASIS) 0.05 % ophthalmic emulsion 1 drop  1 drop Both Eyes BID Velvet Bathe, MD   1 drop at 04/29/17 1000  . diltiazem (CARDIZEM CD) 24 hr capsule 300 mg  300 mg Oral Daily Velvet Bathe, MD   300 mg at 04/29/17 1000  . gabapentin (NEURONTIN) capsule 300 mg  300 mg Oral BID Velvet Bathe, MD   300 mg at 04/29/17 1000  . mirabegron ER (MYRBETRIQ) tablet 50 mg  50 mg Oral Daily Velvet Bathe, MD   50 mg at 04/29/17 1000  . multivitamin with minerals tablet 1 tablet  1 tablet Oral QPM Velvet Bathe, MD   1 tablet at 04/28/17 1744  . ondansetron (ZOFRAN) injection 4 mg  4 mg Intravenous Q6H PRN Bensimhon, Shaune Pascal, MD      . polyethylene glycol (MIRALAX / GLYCOLAX) packet 17 g  17 g Oral Daily PRN Elmahi, Mutaz, MD      . potassium chloride SA (K-DUR,KLOR-CON) CR tablet 40 mEq  40 mEq Oral BID [name]  E, NP   40 mEq at 04/29/17 0901  . rosuvastatin (CRESTOR) tablet 5 mg  5 mg Oral q1800 Velvet Bathe, MD   5 mg at 04/28/17 1744  . sodium chloride flush (NS) 0.9 % injection 3 mL  3 mL Intravenous Q12H Velvet Bathe, MD   3 mL at 04/29/17 1000  . sodium chloride flush (NS) 0.9 % injection 3 mL  3 mL Intravenous PRN Velvet Bathe, MD      . sodium chloride flush (NS) 0.9 % injection 3 mL  3 mL Intravenous Q12H Bensimhon, Shaune Pascal, MD   3 mL at 04/29/17 1146  . sodium chloride flush (NS) 0.9 % injection 3 mL  3 mL Intravenous PRN Bensimhon, Shaune Pascal, MD      . traZODone (DESYREL) tablet 300 mg  300 mg Oral QHS Velvet Bathe, MD   300 mg at 04/28/17 2119  . vitamin C (ASCORBIC ACID) tablet 500  mg  500 mg Oral BID Velvet Bathe, MD   500 mg at 04/29/17 1000  . warfarin (COUMADIN) tablet 4 mg  4 mg Oral ONCE-1800 Otilio Miu, [place]      . Warfarin - Pharmacist Dosing Inpatient   Does not apply q1800 Carney, Gay [place], [facility]         Discharge Medications: Please see discharge summary for a list of discharge medications.  Relevant Imaging Results:  Relevant Lab Results:   Additional Information SS#: [id number]  Candie Chroman, LCSW

## 2017-04-29 NOTE — Progress Notes (Addendum)
PROGRESS NOTE                                                                                                                                                                                                             Patient Demographics:    [name] [name], is a 83 y.o. male, DOB - 1931/10/22, ASN:[id number]  Admit date - 04/26/2017   Admitting Physician Velvet Bathe, MD  Outpatient Primary MD for the patient is [name], MD  LOS - 3  Outpatient Specialists:*  Chief Complaint  Patient presents with  . Fall  . Wound Check       Brief Narrative   83 Y/O male with diastolic HF, PAF on coumadin, severe MV prolapse s/p repair p/w frequent falls at home and increasing leg edema. Pt admitted for right sided heart failure.   Subjective:   Seen after returning from [place]. Denies shortness of breath. feels tired and frustrated not being able to ambulate   Assessment  & Plan :   Principle problem Acute right sided heart failure ( [place]) Had massive leg edema on presentation. On aggressive IV lasix for diuresis. HF team following. On IV milrinone. BB held.  RHC today shows compensated filling pressure with high CO. No PAH seen. Mild RV failure noted.  Held diuretics. Ordered gentle IV fluids post procedure  B/l leg wounds / buttock wounds  wound care consult appreciated.kerlex and coban to BLE for compression. outpt wound care f/up. Santyl ointment to provide enzymatic debridement of nonviable tissue to buttock wound.   Atrial fibrillation ([place]) Rate controlled.  On cardizem. INR therapeutic on coumadin. Continue amidarone.   Peripheral Neuropathy - Continue neurontin  Acute kidney injury  secondary to diuresis. Lasix held today and gentle hydration. Monitor in am.   Hypokalemia replenished   Thrombocytopenia Chronic with multiple bruising on anticoagulation  Generalized weakness and frequent  falls SNF per PT  Code Status : full   Family Communication  : none at bedside  Disposition Plan  : SNF in am  Barriers For Discharge : improving sympyoms  Consults  :  Heart failure  Procedures  :  RHC  DVT Prophylaxis  :  COUMADIN  Lab Results  Component Value Date   PLT 66 (L) 04/29/2017    Antibiotics  :    Anti-infectives  None        Objective:   Vitals:   04/29/17 1029 04/29/17 1034 04/29/17 1039 04/29/17 1051  BP: 115/65 111/64  (!) 113/37  Pulse: 68 71 (!) 0 70  Resp: 17 11 19 18   Temp:    98.6 F (37 C)  TempSrc:    Oral  SpO2: 97% 99% (!) 0% 94%  Weight:      Height:        Wt Readings from Last 3 Encounters:  04/29/17 86.2 kg (190 lb)  04/21/17 88.5 kg (195 lb)  03/11/17 92.9 kg (204 lb 12.8 oz)     Intake/Output Summary (Last 24 hours) at 04/29/17 1524 Last data filed at 04/29/17 1338  Gross per 24 hour  Intake              712 ml  Output             1450 ml  Net             -738 ml     Physical Exam  Gen: not in distress. fatigued HEENT: moist mucosa, supple neck Chest: clear b/l, no added sounds CVS: N S1&S2, no murmurs, rubs or gallop GI: soft, NT, ND,  Musculoskeletal: warm, edema b/l     Data Review:    CBC  Recent Labs Lab 04/26/17 1347 04/29/17 0245  WBC 6.9 9.2  HGB 10.2* 9.1*  HCT 34.3* 30.8*  PLT 64* 66*  MCV 85.3 85.6  MCH 25.4* 25.3*  MCHC 29.7* 29.5*  RDW 16.8* 16.8*  LYMPHSABS 1.0  --   MONOABS 2.0*  --   EOSABS 0.0  --   BASOSABS 0.0  --     Chemistries   Recent Labs Lab 04/24/17 1036 04/26/17 1347 04/27/17 0255 04/28/17 0601 04/29/17 0245  NA 143 144 146* 146* 144  K 3.5 3.6 3.1* 3.5 3.1*  CL 103 104 104 102 98*  CO2 32 33* 34* 36* 35*  GLUCOSE 149* 121* 110* 114* 133*  BUN 36* 36* 29* 31* 40*  CREATININE 1.89* 1.91* 1.55* 1.79* 1.99*  CALCIUM 9.1 9.0 8.5* 9.1 8.6*  MG  --   --   --  2.5*  --   AST  --  26  --   --   --   ALT  --  24  --   --   --   ALKPHOS  --  55  --    --   --   BILITOT  --  1.9*  --   --   --    ------------------------------------------------------------------------------------------------------------------ No results for input(s): CHOL, HDL, LDLCALC, TRIG, CHOLHDL, LDLDIRECT in the last 72 hours.  Lab Results  Component Value Date   HGBA1C 6.0 (H) 04/03/2014   ------------------------------------------------------------------------------------------------------------------ No results for input(s): TSH, T4TOTAL, T3FREE, THYROIDAB in the last 72 hours.  Invalid input(s): FREET3 ------------------------------------------------------------------------------------------------------------------ No results for input(s): VITAMINB12, FOLATE, FERRITIN, TIBC, IRON, RETICCTPCT in the last 72 hours.  Coagulation profile  Recent Labs Lab 04/26/17 1347 04/27/17 1039 04/28/17 0601 04/29/17 0245  INR 3.77 3.92 2.91 2.37    No results for input(s): DDIMER in the last 72 hours.  Cardiac Enzymes No results for input(s): CKMB, TROPONINI, MYOGLOBIN in the last 168 hours.  Invalid input(s): CK ------------------------------------------------------------------------------------------------------------------ No results found for: BNP  Inpatient Medications  Scheduled Meds: . amiodarone  200 mg Oral Daily  . B-complex with vitamin C  1 tablet Oral Daily  . collagenase   Topical Daily  .  cycloSPORINE  1 drop Both Eyes BID  . diltiazem  300 mg Oral Daily  . gabapentin  300 mg Oral BID  . mirabegron ER  50 mg Oral Daily  . multivitamin with minerals  1 tablet Oral QPM  . potassium chloride SA  40 mEq Oral BID  . rosuvastatin  5 mg Oral q1800  . sodium chloride flush  3 mL Intravenous Q12H  . sodium chloride flush  3 mL Intravenous Q12H  . traZODone  300 mg Oral QHS  . vitamin C  500 mg Oral BID  . warfarin  4 mg Oral ONCE-1800  . Warfarin - Pharmacist Dosing Inpatient   Does not apply q1800   Continuous Infusions: . sodium chloride     . sodium chloride 50 mL/hr at 04/29/17 1145  . sodium chloride     PRN Meds:.sodium chloride, sodium chloride, acetaminophen, ondansetron (ZOFRAN) IV, polyethylene glycol, sodium chloride flush, sodium chloride flush  Micro Results No results found for this or any previous visit (from the past 240 hour(s)).  Radiology Reports Dg Shoulder Right  Result Date: 04/26/2017 CLINICAL DATA:  Fall, right shoulder pain EXAM: RIGHT SHOULDER - 2+ VIEW COMPARISON:  None. FINDINGS: No fracture or dislocation is seen. The joint spaces are preserved. Visualized soft tissues are within normal limits. Visualized right lung is clear. IMPRESSION: Negative. Electronically Signed   By: Julian Hy M.D.   On: 04/26/2017 13:01   Ct Head Wo Contrast  Result Date: 04/26/2017 CLINICAL DATA:  Unsteady gait and multiple falls over the past month. The patient most recently fell last night. EXAM: CT HEAD WITHOUT CONTRAST TECHNIQUE: Contiguous axial images were obtained from the base of the skull through the vertex without intravenous contrast. COMPARISON:  Head CT scan 04/03/2014. FINDINGS: Brain: No evidence of acute abnormality including hemorrhage, infarct, mass lesion, mass effect, midline shift or abnormal extra-axial fluid collection. No hydrocephalus or pneumocephalus. Mild atrophy and chronic microvascular ischemic change appear age appropriate. Vascular: Atherosclerosis noted. Skull: Intact. Sinuses/Orbits: Negative. Other: None. IMPRESSION: No acute abnormality. Atherosclerosis. Electronically Signed   By: Inge Rise M.D.   On: 04/26/2017 14:53   Dg Chest Port 1 View  Result Date: 04/26/2017 CLINICAL DATA:  Left upper chest soreness. Patient reports recent fall. EXAM: PORTABLE CHEST 1 VIEW COMPARISON:  PA and lateral chest 04/11/2016 and 02/07/2016. CT chest 02/14/2016. FINDINGS: The patient is status post mitral valve replacement. There is cardiomegaly without edema. Chronic left basilar opacity is  unchanged. The right lung is clear. Aortic atherosclerosis is noted. No acute bony abnormality. IMPRESSION: Cardiomegaly without acute disease. Electronically Signed   By: Inge Rise M.D.   On: 04/26/2017 13:41    Time Spent in minutes  25   Louellen Molder M.D on 04/29/2017 at 3:24 PM  Between 7am to 7pm - Pager - [phone]  After 7pm go to [email or web address] - password [facility]  [facility] -  Office  [phone]

## 2017-04-30 ENCOUNTER — Encounter (HOSPITAL_COMMUNITY): Payer: Medicare Other

## 2017-04-30 DIAGNOSIS — L89150 Pressure ulcer of sacral region, unstageable: Secondary | ICD-10-CM | POA: Diagnosis present

## 2017-04-30 DIAGNOSIS — W19XXXA Unspecified fall, initial encounter: Secondary | ICD-10-CM

## 2017-04-30 DIAGNOSIS — I482 Chronic atrial fibrillation: Secondary | ICD-10-CM

## 2017-04-30 DIAGNOSIS — S81802S Unspecified open wound, left lower leg, sequela: Secondary | ICD-10-CM

## 2017-04-30 DIAGNOSIS — E876 Hypokalemia: Secondary | ICD-10-CM | POA: Diagnosis present

## 2017-04-30 LAB — BASIC METABOLIC PANEL
ANION GAP: 9 (ref 5–15)
BUN: 32 mg/dL — ABNORMAL HIGH (ref 6–20)
CALCIUM: 8.7 mg/dL — AB (ref 8.9–10.3)
CO2: 35 mmol/L — AB (ref 22–32)
Chloride: 100 mmol/L — ABNORMAL LOW (ref 101–111)
Creatinine, Ser: 1.56 mg/dL — ABNORMAL HIGH (ref 0.61–1.24)
GFR, EST AFRICAN AMERICAN: 45 mL/min — AB (ref >60)
GFR, EST NON AFRICAN AMERICAN: 39 mL/min — AB (ref >60)
Glucose, Bld: 120 mg/dL — ABNORMAL HIGH (ref 65–99)
Potassium: 3.6 mmol/L (ref 3.5–5.1)
Sodium: 144 mmol/L (ref 135–145)

## 2017-04-30 LAB — PROTIME-INR
INR: 2.64
PROTHROMBIN TIME: 28.7 s — AB (ref 11.4–15.2)

## 2017-04-30 MED ORDER — COLLAGENASE 250 UNIT/GM EX OINT: TOPICAL_OINTMENT | Freq: Every day | CUTANEOUS | 0 refills | Status: DC

## 2017-04-30 MED ORDER — WARFARIN SODIUM 2.5 MG PO TABS: 2.5000 mg | ORAL_TABLET | Freq: Once | ORAL | Status: DC

## 2017-04-30 MED ORDER — POLYETHYLENE GLYCOL 3350 17 G PO PACK: 17.0000 g | PACK | Freq: Every day | ORAL | 0 refills | Status: DC | PRN

## 2017-04-30 NOTE — Clinical Social Work Note (Signed)
CSW facilitated patient discharge including contacting patient family and facility to confirm patient discharge plans. Clinical information faxed to facility and family agreeable with plan. CSW arranged ambulance transport via [facility] to [facility] at 2:30 pm. RN to call report prior to discharge.  CSW will sign off for now as social work intervention is no longer needed. Please consult [place] again if new needs arise.  Dayton Scrape, [place]

## 2017-04-30 NOTE — Progress Notes (Signed)
Patient transferred to [name] via ambulance.

## 2017-04-30 NOTE — Clinical Social Work Placement (Signed)
   CLINICAL SOCIAL WORK PLACEMENT  NOTE  Date:  04/30/2017  Patient Details  Name: [name] [name] MRN: [id number] Date of Birth: Jan 26, 1931  Clinical Social Work is seeking post-discharge placement for this patient at the [place] level of care (*CSW will initial, date and re-position this form in  chart as items are completed):  Yes   Patient/family provided with [place] Work Department's list of facilities offering this level of care within the geographic area requested by the patient (or if unable, by the patient's family).  Yes   Patient/family informed of their freedom to choose among providers that offer the needed level of care, that participate in Medicare, Medicaid or managed care program needed by the patient, have an available bed and are willing to accept the patient.  Yes   Patient/family informed of [facility]'s ownership interest in [facility] and [facility], as well as of the fact that they are under no obligation to receive care at these facilities.  PASRR submitted to EDS on 04/29/17     PASRR number received on       Existing PASRR number confirmed on 04/29/17     FL2 transmitted to all facilities in geographic area requested by pt/family on 04/29/17     FL2 transmitted to all facilities within larger geographic area on       Patient informed that his/her managed care company has contracts with or will negotiate with certain facilities, including the following:        Yes   Patient/family informed of bed offers received.  Patient chooses bed at [facility]     Physician recommends and patient chooses bed at      [facility] to be transferred [name] on 04/[facility]/17.  Patient to be transferred to facility by [facility]     Patient family notified on 04/30/17 of transfer.  Name of family member notified:  Anna Genre (left voicemail)     PHYSICIAN Please prepare prescriptions     Additional Comment:     _______________________________________________ Candie Chroman, LCSW 04/30/2017, 2:14 PM

## 2017-04-30 NOTE — Progress Notes (Addendum)
Advanced Heart Failure Rounding Note  PCP: Dr. Charlett Blake Primary Cardiologist: Dr. Jose Persia. [place]   Subjective:    Admitted 04/26/17 with fall. Volume overloaded, started on IV lasix. Weight down ~10 pounds. RHC yesterday.  Creatinine 1.89->1.91->1.55->1.79->1.99->1.56   Feels well today, denies SOB, chest pain and orthopnea.   Objective:   Weight Range: 190 lb (86.2 kg) Body mass index is 23.13 kg/m.   Vital Signs:   Temp:  [99 F (37.2 C)-99.9 F (37.7 C)] 99 F (37.2 C) (06/14 0417) Pulse Rate:  [69-72] 69 (06/14 0417) Resp:  [18-189] 18 (06/14 0600) BP: (107-116)/(50-58) 107/51 (06/14 0417) SpO2:  [93 %-95 %] 95 % (06/14 0417) Weight:  [190 lb (86.2 kg)] 190 lb (86.2 kg) (06/14 0417) Last BM Date: 04/28/17  Weight change: Filed Weights   04/28/17 0409 04/29/17 0627 04/30/17 0417  Weight: 193 lb (87.5 kg) 190 lb (86.2 kg) 190 lb (86.2 kg)    Intake/Output:   Intake/Output Summary (Last 24 hours) at 04/30/17 1305 Last data filed at 04/30/17 1200  Gross per 24 hour  Intake           2721.5 ml  Output             1600 ml  Net           1121.5 ml     Physical Exam: General: Elderly male, NAD. Lying in bed.  HEENT: normal Neck: supple. JVP 5-6, brisk CV wave.  Carotids 2+ bilat; no bruits. No thyromegaly or nodule noted. Cor: PMI nondisplaced. RRR. 2/6 TR murmur.  Lungs: CTAB, normal effort. Abdomen: soft, non-tender, distended, no HSM. No bruits or masses. +BS  Extremities: no cyanosis, clubbing, rash. No peripheral edema. UNNA boots in place.  Neuro: alert & orientedx3, cranial nerves grossly intact. moves all 4 extremities w/o difficulty. Affect pleasant     Telemetry: NSR  Labs: CBC  Recent Labs  04/29/17 0245  WBC 9.2  HGB 9.1*  HCT 30.8*  MCV 85.6  PLT 66*   Basic Metabolic Panel  Recent Labs  04/28/17 0601 04/29/17 0245 04/30/17 0202  NA 146* 144 144  K 3.5 3.1* 3.6  CL 102 98* 100*  CO2 36* 35* 35*  GLUCOSE 114* 133*  120*  BUN 31* 40* 32*  CREATININE 1.79* 1.99* 1.56*  CALCIUM 9.1 8.6* 8.7*  MG 2.5*  --   --    Liver Function Tests No results for input(s): AST, ALT, ALKPHOS, BILITOT, PROT, ALBUMIN in the last 72 hours. No results for input(s): LIPASE, AMYLASE in the last 72 hours.  Transthoracic Echocardiography 12/19/16 Study Conclusions  - Left ventricle: The cavity size was normal. Wall thickness was   increased in a pattern of mild LVH. Systolic function was normal.   The estimated ejection fraction was in the range of 50% to 55%. - Aortic valve: There was mild regurgitation. - Mitral valve: Post mitral valve repair with just mild residual   MR. - Left atrium: The atrium was moderately dilated. - Right ventricle: The cavity size was moderately dilated. - Right atrium: The atrium was severely dilated. - Atrial septum: No defect or patent foramen ovale was identified. - Tricuspid valve: There was severe regurgitation  Right Heart Cath 04/29/17 Findings:  RA = 8 RV = 36/7 PA = 33/4 (15) PCW = 6 Fick cardiac output/index = 7.7/3.6 PVR = 1.2 WU Ao sat = 99% PA sat = 69%, 72% SVC sat = 69%  Assessment:  Well compensated filling pressures  with high cardiac output.  No evidence of PAH.  Mildly elevated RA pressure in setting of normal PCWP suggests mild RV failure     Medications:     Scheduled Medications: . amiodarone  200 mg Oral Daily  . B-complex with vitamin C  1 tablet Oral Daily  . collagenase   Topical Daily  . cycloSPORINE  1 drop Both Eyes BID  . diltiazem  300 mg Oral Daily  . gabapentin  300 mg Oral BID  . mirabegron ER  50 mg Oral Daily  . multivitamin with minerals  1 tablet Oral QPM  . potassium chloride SA  40 mEq Oral BID  . rosuvastatin  5 mg Oral q1800  . sodium chloride flush  3 mL Intravenous Q12H  . sodium chloride flush  3 mL Intravenous Q12H  . traZODone  300 mg Oral QHS  . vitamin C  500 mg Oral BID  . Warfarin - Pharmacist Dosing Inpatient    Does not apply q1800    Infusions: . sodium chloride    . sodium chloride      PRN Medications: sodium chloride, sodium chloride, acetaminophen, ondansetron (ZOFRAN) IV, polyethylene glycol, sodium chloride flush, sodium chloride flush   Assessment/Plan   1. Fall: Denies syncope, remembers falling.  - PT/OT recommending SNF, will go to [facility]  2. Acute on chronic diastolic CHF: Echo 06/3253 with EF 50-55%, mild MR, RV moderately dilated, severe TR.  - NYHA IV - Volume status stable -  Continue torsemide 40mg  daily with an extra 20mg  in the afternoon if needed for weight gain.  3. 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  - no evidence of PAH on right heart cath.  4. S/p mitral valve repair - valve repair functioning normally by last Echo - Continue current medical management.   5. History of PAF - NSR today.  - Continue Amiodarone 200 mg daily.  - Continue diltiazem 300 mg daily.  - This patients CHA2DS2-VASc Score and unadjusted Ischemic Stroke Rate (% per year) is equal to 4.8 % stroke rate/year from a score of 4 Above score calculated as 1 point each if present [CHF, HTN, DM, Vascular=MI/PAD/Aortic Plaque, Age if 65-74, or Male], 2 points each if present [Age > 75, or Stroke/TIA/TE] - Continue warfarin per pharmacy. INR 2.64 - No change to current plan.  6. Hypokalemia - K 3.6  - Stable.  7. Acute on chronic RV failure - Volume status improved.   Length of Stay: [place], NP  04/30/2017, 1:05 PM  Advanced Heart Failure Team Pager [phone] (M-F; 7a - 4p)  Please contact [facility] for night-coverage after hours (4p -7a ) and weekends on [email or web address]   Patient seen and examined with [name], NP. We discussed all aspects of the encounter. I agree with the assessment and plan as stated above.   Volume status looks good. Creatine back down. Maintaining NSR on amio. INR therapeutic. Can go to SNF today with f/u in  HF Clinic.   [name], MD  7:33 PM

## 2017-04-30 NOTE — Discharge Summary (Signed)
Physician Discharge Summary  [name] [name] JJK:[id number] DOB: 01-29-1931 DOA: 04/26/2017  PCP: [name], MD  Admit date: 04/26/2017 Discharge date: 04/30/2017  Admitted From: SNF Disposition:  SNF  Recommendations for Outpatient Follow-up:  1. Follow up with heart failure clinic and MD at SNF   Equipment/Devices: NONE  Discharge Condition:Fair CODE STATUS: Full code Diet recommendation: Heart Healthy     Discharge Diagnoses:  Principal problem   Acute on chronic diastolic (congestive) heart failure (HCC)   Active Problems:    Atrial fibrillation (HCC)   Right-sided heart failure (HCC)   PAH (pulmonary artery hypertension) (HCC)   Pressure injury of skin   Hypokalemia   Leg wound, left, sequela   Unstageable pressure ulcer of sacral region [facility])   Brief narrative/history of present illness Please refer to admission H&P for details, in brief,83 Y/O male with diastolic HF, PAF on coumadin, severe MV prolapse s/p repair p/w frequent falls at home and increasing leg edema. Pt admitted for right sided heart failure.  Principle problem Acute right sided heart failure ( [place]) Had massive leg edema on presentation. Received aggressive IV lasix for diuresis along with IV milrinone.  RHC done showed compensated filling pressure with high CO. No PAH seen. Mild RV failure noted.  Symptoms better. Continue torsemide, beta blocker and statin.  B/l leg wounds / buttock wounds  wound care consult appreciated.kerlex and coban to BLE for compression. outpt wound care f/up. Santyl ointment to provide enzymatic debridement of nonviable tissue to buttock wound.  Atrial fibrillation ([place]) Rate controlled.  On cardizem. INR therapeutic on coumadin. Continue amidarone.    Peripheral Neuropathy - Continue neurontin  Acute kidney injury  secondary to diuresis. Lasix held and given gentle hydration on 6/13. Renal function better today. Resumed home dose  torsemide.   Hypokalemia replenished   Thrombocytopenia Chronic with multiple bruising on anticoagulation  Generalized weakness and frequent falls SNF per PT    Family Communication  : none at bedside  Disposition Plan  : SNF   Consults  :  Heart failure  Procedures  :  Right heart catheterization   Discharge Instructions   Allergies as of 04/30/2017   No Known Allergies     Medication List    STOP taking these medications   furosemide 40 MG tablet Commonly known as:  LASIX     TAKE these medications   amiodarone 200 MG tablet Commonly known as:  PACERONE Take 200 mg by mouth daily.   b complex vitamins tablet Take 1 tablet by mouth daily.   CARTIA XT 300 MG 24 hr capsule Generic drug:  diltiazem Take 300 mg by mouth daily.   collagenase ointment Commonly known as:  SANTYL Apply topically daily. Start taking on:  05/01/2017   cycloSPORINE 0.05 % ophthalmic emulsion Commonly known as:  RESTASIS Place 1 drop into both eyes 2 (two) times daily.   gabapentin 300 MG capsule Commonly known as:  NEURONTIN Take 300 mg by mouth 2 (two) times daily.   glucosamine-chondroitin 500-400 MG tablet Take 1 tablet by mouth 2 (two) times daily.   multivitamin with minerals Tabs tablet Take 1 tablet by mouth every evening.   MYRBETRIQ 50 MG Tb24 tablet Generic drug:  mirabegron ER Take 1 tablet by mouth daily.   polyethylene glycol packet Commonly known as:  MIRALAX / GLYCOLAX Take 17 g by mouth daily as needed for mild constipation.   potassium chloride SA 20 MEQ tablet Commonly known as:  K-DUR,KLOR-CON Take  1 tablet (20 mEq total) by mouth daily.   rosuvastatin 5 MG tablet Commonly known as:  CRESTOR Take 5 mg by mouth daily at 6 PM.   TOPROL XL 50 MG 24 hr tablet Generic drug:  metoprolol succinate Take 50 mg by mouth daily.   torsemide 20 MG tablet Commonly known as:  DEMADEX Take 40 mg (2 Tablets) in the AM and 20 mg (1 Tablet) in  the PM.   traZODone 100 MG tablet Commonly known as:  DESYREL Take 300 mg by mouth at bedtime.   vitamin C 500 MG tablet Commonly known as:  ASCORBIC ACID Take 500 mg by mouth 2 (two) times daily.   warfarin 5 MG tablet Commonly known as:  COUMADIN Take 2.5 mg by mouth daily at 6 PM. What changed:  Another medication with the same name was removed. Continue taking this medication, and follow the directions you see here.       Contact information for follow-up providers    [place] Follow up on 05/08/2017.   Specialty:  Cardiology Why:  at 11:00 am for hospital follow up. Garage code [employer] information: [address] [id number] [place] [phone]           Contact information for after-discharge care    Destination    [facility] Follow up.   Specialty:  Skilled Nursing Facility Contact information: [address] [address] [phone]                 No Known Allergies   Procedures/Studies: Dg Shoulder Right  Result Date: 04/26/2017 CLINICAL DATA:  Fall, right shoulder pain EXAM: RIGHT SHOULDER - 2+ VIEW COMPARISON:  None. FINDINGS: No fracture or dislocation is seen. The joint spaces are preserved. Visualized soft tissues are within normal limits. Visualized right lung is clear. IMPRESSION: Negative. Electronically Signed   By: Julian Hy M.D.   On: 04/26/2017 13:01   Ct Head Wo Contrast  Result Date: 04/26/2017 CLINICAL DATA:  Unsteady gait and multiple falls over the past month. The patient most recently fell last night. EXAM: CT HEAD WITHOUT CONTRAST TECHNIQUE: Contiguous axial images were obtained from the base of the skull through the vertex without intravenous contrast. COMPARISON:  Head CT scan 04/03/2014. FINDINGS: Brain: No evidence of acute abnormality including hemorrhage, infarct, mass lesion, mass effect, midline  shift or abnormal extra-axial fluid collection. No hydrocephalus or pneumocephalus. Mild atrophy and chronic microvascular ischemic change appear age appropriate. Vascular: Atherosclerosis noted. Skull: Intact. Sinuses/Orbits: Negative. Other: None. IMPRESSION: No acute abnormality. Atherosclerosis. Electronically Signed   By: Inge Rise M.D.   On: 04/26/2017 14:53   Dg Chest Port 1 View  Result Date: 04/26/2017 CLINICAL DATA:  Left upper chest soreness. Patient reports recent fall. EXAM: PORTABLE CHEST 1 VIEW COMPARISON:  PA and lateral chest 04/11/2016 and 02/07/2016. CT chest 02/14/2016. FINDINGS: The patient is status post mitral valve replacement. There is cardiomegaly without edema. Chronic left basilar opacity is unchanged. The right lung is clear. Aortic atherosclerosis is noted. No acute bony abnormality. IMPRESSION: Cardiomegaly without acute disease. Electronically Signed   By: Inge Rise M.D.   On: 04/26/2017 13:41       Subjective: Feels his breathing to be better. Wants to get up and move around.  Discharge Exam: Vitals:   04/30/17 0417 04/30/17 0600  BP: (!) 107/51   Pulse: 69   Resp: (!) 189  18  Temp: 99 F (37.2 C)    Vitals:   04/29/17 2010 04/30/17 0000 04/30/17 0417 04/30/17 0600  BP: (!) 116/58 (!) 107/50 (!) 107/51   Pulse: 71 72 69   Resp:  20 (!) 189 18  Temp:  99.9 F (37.7 C) 99 F (37.2 C)   TempSrc:  Oral Oral   SpO2: 93% 93% 95%   Weight:   86.2 kg (190 lb)   Height:        Gen: not in distress. fatigued HEENT: moist mucosa, supple neck Chest: clear b/l, no added sounds CVS: Normal S1 and S2 with 3/6 systolic murmur. GI: soft, NT, ND,  Musculoskeletal: warm, trace edema bilaterally. Dressing over bilateral leg.  The results of significant diagnostics from this hospitalization (including imaging, microbiology, ancillary and laboratory) are listed below for reference.     Microbiology: No results found for this or any previous  visit (from the past 240 hour(s)).   Labs: BNP (last 3 results) No results for input(s): BNP in the last 8760 hours. Basic Metabolic Panel:  Recent Labs Lab 04/26/17 1347 04/27/17 0255 04/28/17 0601 04/29/17 0245 04/30/17 0202  NA 144 146* 146* 144 144  K 3.6 3.1* 3.5 3.1* 3.6  CL 104 104 102 98* 100*  CO2 33* 34* 36* 35* 35*  GLUCOSE 121* 110* 114* 133* 120*  BUN 36* 29* 31* 40* 32*  CREATININE 1.91* 1.55* 1.79* 1.99* 1.56*  CALCIUM 9.0 8.5* 9.1 8.6* 8.7*  MG  --   --  2.5*  --   --    Liver Function Tests:  Recent Labs Lab 04/26/17 1347  AST 26  ALT 24  ALKPHOS 55  BILITOT 1.9*  PROT 7.0  ALBUMIN 3.4*    Recent Labs Lab 04/26/17 1347  LIPASE 37   No results for input(s): AMMONIA in the last 168 hours. CBC:  Recent Labs Lab 04/26/17 1347 04/29/17 0245  WBC 6.9 9.2  NEUTROABS 3.9  --   HGB 10.2* 9.1*  HCT 34.3* 30.8*  MCV 85.3 85.6  PLT 64* 66*   Cardiac Enzymes: No results for input(s): CKTOTAL, CKMB, CKMBINDEX, TROPONINI in the last 168 hours. BNP: Invalid input(s): POCBNP CBG: No results for input(s): GLUCAP in the last 168 hours. D-Dimer No results for input(s): DDIMER in the last 72 hours. Hgb A1c No results for input(s): HGBA1C in the last 72 hours. Lipid Profile No results for input(s): CHOL, HDL, LDLCALC, TRIG, CHOLHDL, LDLDIRECT in the last 72 hours. Thyroid function studies No results for input(s): TSH, T4TOTAL, T3FREE, THYROIDAB in the last 72 hours.  Invalid input(s): FREET3 Anemia work up No results for input(s): VITAMINB12, FOLATE, FERRITIN, TIBC, IRON, RETICCTPCT in the last 72 hours. Urinalysis    Component Value Date/Time   COLORURINE YELLOW 04/26/2017 2259   APPEARANCEUR HAZY (A) 04/26/2017 2259   LABSPEC 1.009 04/26/2017 2259   PHURINE 8.0 04/26/2017 2259   GLUCOSEU NEGATIVE 04/26/2017 2259   HGBUR NEGATIVE 04/26/2017 2259   BILIRUBINUR NEGATIVE 04/26/2017 2259   KETONESUR NEGATIVE 04/26/2017 2259   PROTEINUR  NEGATIVE 04/26/2017 2259   UROBILINOGEN 1.0 04/02/2014 2339   NITRITE NEGATIVE 04/26/2017 2259   LEUKOCYTESUR NEGATIVE 04/26/2017 2259   Sepsis Labs Invalid input(s): PROCALCITONIN,  WBC,  LACTICIDVEN Microbiology No results found for this or any previous visit (from the past 240 hour(s)).   Time coordinating discharge: Over 30 minutes  SIGNED:   Louellen Molder, MD  [facility] 04/30/2017, 10:08 AM Pager   If 7PM-7AM, please  contact night-coverage [email or web address] Password [email or web address]

## 2017-04-30 NOTE — Progress Notes (Signed)
Report called to [place], Therapist, sports at [facility]. Daughter aware that patient is being transferred.

## 2017-05-06 ENCOUNTER — Ambulatory Visit: Payer: Medicare Other | Admitting: Neurology

## 2017-05-07 ENCOUNTER — Encounter (HOSPITAL_COMMUNITY): Payer: Medicare Other | Admitting: Internal Medicine

## 2017-05-08 ENCOUNTER — Telehealth (HOSPITAL_COMMUNITY): Payer: Self-pay

## 2017-05-08 ENCOUNTER — Ambulatory Visit (HOSPITAL_COMMUNITY)
Admit: 2017-05-08 | Discharge: 2017-05-08 | Disposition: A | Discharge status: Home / Self Care | Payer: Medicare Other | Source: Ambulatory Visit | Attending: Cardiology | Admitting: Cardiology

## 2017-05-08 ENCOUNTER — Ambulatory Visit: Payer: Medicare Other | Admitting: Physician Assistant

## 2017-05-08 VITALS — BP 109/61 | HR 60 | Wt 183.0 lb

## 2017-05-08 DIAGNOSIS — I480 Paroxysmal atrial fibrillation: Secondary | ICD-10-CM | POA: Diagnosis not present

## 2017-05-08 DIAGNOSIS — Z818 Family history of other mental and behavioral disorders: Secondary | ICD-10-CM | POA: Diagnosis not present

## 2017-05-08 DIAGNOSIS — Z8546 Personal history of malignant neoplasm of prostate: Secondary | ICD-10-CM | POA: Diagnosis not present

## 2017-05-08 DIAGNOSIS — E7800 Pure hypercholesterolemia, unspecified: Secondary | ICD-10-CM | POA: Diagnosis not present

## 2017-05-08 DIAGNOSIS — I5032 Chronic diastolic (congestive) heart failure: Secondary | ICD-10-CM | POA: Diagnosis not present

## 2017-05-08 DIAGNOSIS — Z9889 Other specified postprocedural states: Secondary | ICD-10-CM | POA: Diagnosis not present

## 2017-05-08 DIAGNOSIS — Z79899 Other long term (current) drug therapy: Secondary | ICD-10-CM | POA: Insufficient documentation

## 2017-05-08 DIAGNOSIS — I5042 Chronic combined systolic (congestive) and diastolic (congestive) heart failure: Secondary | ICD-10-CM | POA: Diagnosis not present

## 2017-05-08 DIAGNOSIS — I272 Pulmonary hypertension, unspecified: Secondary | ICD-10-CM | POA: Insufficient documentation

## 2017-05-08 DIAGNOSIS — I071 Rheumatic tricuspid insufficiency: Secondary | ICD-10-CM | POA: Diagnosis not present

## 2017-05-08 DIAGNOSIS — I110 Hypertensive heart disease with heart failure: Secondary | ICD-10-CM | POA: Insufficient documentation

## 2017-05-08 DIAGNOSIS — Z7901 Long term (current) use of anticoagulants: Secondary | ICD-10-CM | POA: Diagnosis not present

## 2017-05-08 DIAGNOSIS — Z8261 Family history of arthritis: Secondary | ICD-10-CM | POA: Diagnosis not present

## 2017-05-08 DIAGNOSIS — Z8249 Family history of ischemic heart disease and other diseases of the circulatory system: Secondary | ICD-10-CM | POA: Insufficient documentation

## 2017-05-08 DIAGNOSIS — Z85820 Personal history of malignant melanoma of skin: Secondary | ICD-10-CM | POA: Diagnosis not present

## 2017-05-08 DIAGNOSIS — M179 Osteoarthritis of knee, unspecified: Secondary | ICD-10-CM | POA: Insufficient documentation

## 2017-05-08 DIAGNOSIS — Z823 Family history of stroke: Secondary | ICD-10-CM | POA: Insufficient documentation

## 2017-05-08 LAB — BASIC METABOLIC PANEL
Anion gap: 10 (ref 5–15)
BUN: 26 mg/dL — ABNORMAL HIGH (ref 6–20)
CO2: 32 mmol/L (ref 22–32)
CREATININE: 1.43 mg/dL — AB (ref 0.61–1.24)
Calcium: 9.1 mg/dL (ref 8.9–10.3)
Chloride: 102 mmol/L (ref 101–111)
GFR calc non Af Amer: 43 mL/min — ABNORMAL LOW (ref >60)
GFR, EST AFRICAN AMERICAN: 50 mL/min — AB (ref >60)
Glucose, Bld: 151 mg/dL — ABNORMAL HIGH (ref 65–99)
Potassium: 3 mmol/L — ABNORMAL LOW (ref 3.5–5.1)
SODIUM: 144 mmol/L (ref 135–145)

## 2017-05-08 MED ORDER — TORSEMIDE 20 MG PO TABS: ORAL_TABLET | ORAL | 3 refills | Status: DC

## 2017-05-08 MED ORDER — POTASSIUM CHLORIDE CRYS ER 20 MEQ PO TBCR: 40.0000 meq | EXTENDED_RELEASE_TABLET | Freq: Every day | ORAL | 3 refills | Status: DC

## 2017-05-08 NOTE — Patient Instructions (Signed)
Take Torsemide 40 mg twice daily for FOUR DAYS, then reduce to 40 mg in am and 20 mg in pm.  Increase potassium to 40 meq (2 tabs) once daily.  Place unna boots for lower extremity swelling.  Routine lab work today. Will notify you of abnormal results, otherwise no news is good news!  Follow up 1 month with Dr. Haroldine Laws. Take all medication as prescribed the day of your appointment. Bring all medications with you to your appointment.  Do the following things EVERYDAY: 1) Weigh yourself in the morning before breakfast. Write it down and keep it in a log. 2) Take your medicines as prescribed 3) Eat low salt foods-Limit salt (sodium) to 2000 mg per day.  4) Stay as active as you can everyday 5) Limit all fluids for the day to less than 2 liters

## 2017-05-08 NOTE — Progress Notes (Signed)
Advanced Heart Failure Medication Review by a Pharmacist  Does the patient  feel that his/her medications are working for him/her?  yes  Has the patient been experiencing any side effects to the medications prescribed?  no  Does the patient measure his/her own blood pressure or blood glucose at home?  no   Does the patient have any problems obtaining medications due to transportation or finances?   no  Understanding of regimen: good Understanding of indications: good Potential of compliance: good Patient understands to avoid NSAIDs. Patient understands to avoid decongestants.  Issues to address at subsequent visits: none.   Pharmacist comments: [name] [name] is a pleasant 83 y/o patient who presents with his medication list from [facility]. He endorses good adherence to his medication regimen and he had no medication-related questions or concerns for me at this time.   Phillis Knack PharmD Candidate  Time with patient: 10 minutes Preparation and documentation time: 10 minutes Total time: 20 minutes

## 2017-05-08 NOTE — Progress Notes (Signed)
Advanced Heart Failure Clinic Consult Note   Referring Physician: Dr. Irish Lack Primary Care: Penni Homans, MD Primary Cardiologist: Dr. Irish Lack ( Previously [facility] Ferrari) Primary HF: New (Dr. Haroldine Laws)   HPI:  [name] [name] ([place]) is an 83 y.o. male with history of diastolic HF, PAF/Aflutter on coumadin, and h/o severe MV prolapse s/p MV repair 10/01/11 who is referred for further evaluation of his heart failure.   Pt seen in Dr. Irish Lack office 03/11/17 and noted to have worsening peripheral edema. Thought to be related to RV failure.  Pt admitted non-compliance with second lasix dose some days. RHC suggested but pt prefers to avoid invasive procedures at this time. Lasix increased and referred to [facility] for wound care of BLEs.   He established care with Dr. Haroldine Laws in June 2018. His lasix was switched to torsemide.   He was admitted 04/26/17-04/30/17 with a fall, no syncope. Also with right sided heart failure. RHC showed normal cardiac output. No evidence of PAH. Diuresed with IV lasix, weight down about 10 pounds. Discharge weight was 190 pounds. He was discharged to SNF.   He returns today for HF follow up. He is residing at [facility] home. Weights are increasing steadily - 183-186 pounds. He has developed some worsening BLE edema. He denies orthopnea and PND. He is working with PT at his SNF and does not feel SOB, walking with a walker a few feet at a times. Eating some high salt foods, drinking less than 2L a day. His daughter is present at the visit and says that his oral intake has declined overall.   Echo 12/19/16 with LVEF 50-55%, Mild AI, s/p MVR with mild residual MR, Mod LAE, Mod RV dilation, severe RAE, Severe TR.  Past Medical History:  Diagnosis Date  . Abnormality of gait 05/27/2016  . Adenomatous polyps   . Carpal tunnel syndrome 06/25/2016   Right  . Depressive disorder, not elsewhere classified   . First degree AV block    Holter 3/18: NSR, PACs, PVCs, no  AFib, no pauses.  Marland Kitchen Hereditary and idiopathic peripheral neuropathy 06/25/2016  . Hypertension   . Internal nasal lesion 05/15/2013  . Melanoma ([place])    Left Shoulder  . Mitral regurgitation   . MVP (mitral valve prolapse)    a. With severe MR s/p Complex valvuloplasty including artificial Gore-tex neochord placement x4, chordal transposition x1, chordal release x1, # 32 mm Sorin Memo 3D Ring Annuloplasty 2012. // b. Echo 2/18: mild LVH, EF 50-55, mild AI, MV repair with mild MR, mod LAE, mod RVE, severe RAE, severe TR  . Neuropathy   . Normal coronary arteries    a. Normal coronary anatomy by cath 2012.  . Osteoarthritis    Knees  . PAF (paroxysmal atrial fibrillation) ([place])    a. Post-op MVR 2012.  Marland Kitchen Personal history of colonic polyps   . Prostate cancer ([place])   . Pulmonary HTN ([place])    a. Mild-mod by cath 2012.  . Pure hypercholesterolemia   . PVC (premature ventricular contraction)   . Thrombocytopenia ([place])   . Vision abnormalities    Cornea scarring   Current Outpatient Prescriptions  Medication Sig Dispense Refill  . amiodarone (PACERONE) 200 MG tablet Take 200 mg by mouth daily.     Marland Kitchen b complex vitamins tablet Take 1 tablet by mouth daily.    Marland Kitchen CARTIA XT 300 MG 24 hr capsule Take 300 mg by mouth daily.     . collagenase (SANTYL) ointment  Apply topically daily. 15 g 0  . cycloSPORINE (RESTASIS) 0.05 % ophthalmic emulsion Place 1 drop into both eyes 2 (two) times daily.    Marland Kitchen gabapentin (NEURONTIN) 300 MG capsule Take 300 mg by mouth 2 (two) times daily.     Marland Kitchen glucosamine-chondroitin 500-400 MG tablet Take 1 tablet by mouth 2 (two) times daily.    . Multiple Vitamin (MULTIVITAMIN WITH MINERALS) TABS tablet Take 1 tablet by mouth every evening.     Marland Kitchen MYRBETRIQ 50 MG TB24 tablet Take 1 tablet by mouth daily.    . potassium chloride SA (K-DUR,KLOR-CON) 20 MEQ tablet Take 1 tablet (20 mEq total) by mouth daily. 90 tablet 2  . rosuvastatin (CRESTOR) 5 MG tablet Take 5 mg by mouth  daily at 6 PM.     . TOPROL XL 50 MG 24 hr tablet Take 50 mg by mouth daily.     . traZODone (DESYREL) 100 MG tablet Take 300 mg by mouth at bedtime.     . vitamin C (ASCORBIC ACID) 500 MG tablet Take 500 mg by mouth 2 (two) times daily.     No current facility-administered medications for this encounter.    No Known Allergies   Social History   Social History  . Marital status: Widowed    Spouse name: N/A  . Number of children: 2  . Years of education: 32   Occupational History  . Not on file.   Social History Main Topics  . Smoking status: Never Smoker  . Smokeless tobacco: Never Used  . Alcohol use No     Comment: Last drink in 2000  . Drug use: No  . Sexual activity: Not on file   Other Topics Concern  . Not on file   Social History Narrative   Retired - Optometrist   Widower   2 children   Drinks 1 cup of coffee per day   Family History  Problem Relation Age of Onset  . Clotting disorder Brother        CVA's  . Arthritis Mother   . Hypertension Mother   . Stroke Mother   . Hypertension Father   . Psychosis Father        psychiatric care  . Colon cancer Neg Hx   . Stomach cancer Neg Hx   . Heart attack Neg Hx    Vitals:   05/08/17 1106  BP: 109/61  Pulse: 60  SpO2: 92%  Weight: 183 lb (83 kg)   Wt Readings from Last 3 Encounters:  05/08/17 183 lb (83 kg)  04/30/17 190 lb (86.2 kg)  04/21/17 195 lb (88.5 kg)    PHYSICAL EXAM: General: Elderly appearing male. NAD. Arrived in wheelchair. Daughter present at visit.  HEENT: Normal. Neck: supple. JVP to jaw with brisk CV wave. Carotids 2+ bilat; no bruits. No lymphadenopathy or thyromegaly appreciated. Cor: PMI nondisplaced. Regular rate & rhythm. 2/6 TR murmur, +RV lift.  Lungs: Clear in upper lobes, fine crackles in bases.  Abdomen: Soft, nontender, + distended. No hepatosplenomegaly. No bruits or masses. Good bowel sounds. Extremities: no cyanosis, clubbing, or rash. 3+ pretibial edema.  Neuro:  alert & oriented x 3, cranial nerves grossly intact. moves all 4 extremities w/o difficulty. Affect pleasant.  ASSESSMENT & PLAN:  1. Combined diastolic HF with R>L symptoms - Echo 12/19/16 with LVEF 50-55%, Mild AI, s/p MVR with mild residual MR, Mod LAE, Mod RV dilation, severe RAE, Severe TR. - NYHA IIIb - Volume status elevated on  exam. Will increase torsemide to 40mg  BID for the next 4 days, then start torsemide 40mg  in the am and 20mg  in the pm (he was previously taking 20mg  BID). Will increase KCl supplementation to 103mEq daily as well.   - Continue Toprol XL 50mg  daily.  - Will write orders for SNF.  - Place unna boots at SNF.  - BMET today.  2. PAF - NSR by exam. On diltiazem 300mg  + Amio 200mg  daily.  - Continue warfarin for anticoagulation 3. Severe TR - Likely contributing to his right sided symptoms. 4. PAH - last RHC without severe PAH.   Follow up in one month with Dr. Peri Maris, NP 05/08/17

## 2017-05-08 NOTE — Telephone Encounter (Signed)
CHF Clinic appointment reminder call placed to patient for upcoming post-hospital follow up.  [facility] to confirm apt with patient who is currently at [place] to call our office for tardiness or cancellations/rescheduling needs.  Leory Plowman, [place] Ferrari

## 2017-05-14 ENCOUNTER — Telehealth (HOSPITAL_COMMUNITY): Payer: Self-pay | Admitting: *Deleted

## 2017-05-14 MED ORDER — TORSEMIDE 20 MG PO TABS: 40.0000 mg | ORAL_TABLET | Freq: Two times a day (BID) | ORAL | 3 refills | Status: DC

## 2017-05-14 NOTE — Telephone Encounter (Signed)
Pt's daughter called concerned about pt's swelling.  She states he did the increased Torsemide for 4 days and swelling improved some but is still present.  Discussed w/Erin Tamala Julian, [place] she states ok to continue Torsemide 40 mg BID with bmet next week.  Called pt's facility[name] [phone] and gave orders to RN, they will also draw labs there.  Pt's daughter Manuela Schwartz is aware, med list updated

## 2017-05-21 ENCOUNTER — Telehealth (HOSPITAL_COMMUNITY): Payer: Self-pay | Admitting: *Deleted

## 2017-05-21 NOTE — Telephone Encounter (Signed)
Pt's daughter called requesting we order PT/OT for pt, he is currently in [place] and will be moving to the skilled nursing facility and they need order to continue his PT/OT.  Order faxed to them at [phone]

## 2017-06-09 ENCOUNTER — Telehealth: Payer: Self-pay | Admitting: Neurology

## 2017-06-09 NOTE — Telephone Encounter (Signed)
Pt daughter is asking for a call to discuss discoloring of pt's leg and foot. Please call

## 2017-06-09 NOTE — Telephone Encounter (Signed)
I called the patient and talked with the daughter. The patient has had some discoloration in the feet, this may be related to the peripheral neuropathy and incompetent valves of the veins in the legs, this may turn the feet purple or reddish purple with sitting or standing, sometimes petechial hemorrhages may result in a rust color discoloration of the skin.  The patient does not have warmth of the legs. He did have more significant swelling earlier related to congestive heart failure, he has been placed on Demadex.

## 2017-06-09 NOTE — Telephone Encounter (Signed)
Called Manuela Schwartz back. Pt legs very swollen in the past, found to be hear related. He was placed on diuretic. Right leg more discolored than left leg per daughter. Swelling has gone down. Denies any warmth/hot to touch or any pain in legs. She wanted to make sure there was nothing else that needed to be done. Advised her to continue to monitor legs and if any new symptoms arise, she should let us know. I will send to CW,MD to make sure there is nothing more he would like to do. She verbalized understanding.

## 2017-07-09 ENCOUNTER — Ambulatory Visit (HOSPITAL_COMMUNITY)
Admission: RE | Admit: 2017-07-09 | Discharge: 2017-07-09 | Disposition: A | Discharge status: Home / Self Care | Payer: Medicare Other | Source: Ambulatory Visit | Attending: Internal Medicine | Admitting: Internal Medicine

## 2017-07-09 ENCOUNTER — Encounter (HOSPITAL_COMMUNITY): Payer: Self-pay | Admitting: Internal Medicine

## 2017-07-09 VITALS — BP 119/67 | HR 68 | Wt 152.1 lb

## 2017-07-09 DIAGNOSIS — I071 Rheumatic tricuspid insufficiency: Secondary | ICD-10-CM

## 2017-07-09 DIAGNOSIS — I481 Persistent atrial fibrillation: Secondary | ICD-10-CM | POA: Diagnosis not present

## 2017-07-09 DIAGNOSIS — I480 Paroxysmal atrial fibrillation: Secondary | ICD-10-CM | POA: Diagnosis not present

## 2017-07-09 DIAGNOSIS — R54 Age-related physical debility: Secondary | ICD-10-CM | POA: Insufficient documentation

## 2017-07-09 DIAGNOSIS — I110 Hypertensive heart disease with heart failure: Secondary | ICD-10-CM | POA: Diagnosis not present

## 2017-07-09 DIAGNOSIS — F329 Major depressive disorder, single episode, unspecified: Secondary | ICD-10-CM | POA: Diagnosis not present

## 2017-07-09 DIAGNOSIS — R634 Abnormal weight loss: Secondary | ICD-10-CM | POA: Diagnosis not present

## 2017-07-09 DIAGNOSIS — E7800 Pure hypercholesterolemia, unspecified: Secondary | ICD-10-CM | POA: Diagnosis not present

## 2017-07-09 DIAGNOSIS — Z7901 Long term (current) use of anticoagulants: Secondary | ICD-10-CM | POA: Insufficient documentation

## 2017-07-09 DIAGNOSIS — I4819 Other persistent atrial fibrillation: Secondary | ICD-10-CM

## 2017-07-09 DIAGNOSIS — Z79899 Other long term (current) drug therapy: Secondary | ICD-10-CM | POA: Insufficient documentation

## 2017-07-09 DIAGNOSIS — I5032 Chronic diastolic (congestive) heart failure: Secondary | ICD-10-CM | POA: Diagnosis not present

## 2017-07-09 LAB — COMPREHENSIVE METABOLIC PANEL
ALBUMIN: 3.9 g/dL (ref 3.5–5.0)
ALK PHOS: 78 U/L (ref 38–126)
ALT: 29 U/L (ref 17–63)
AST: 30 U/L (ref 15–41)
Anion gap: 8 (ref 5–15)
BILIRUBIN TOTAL: 0.6 mg/dL (ref 0.3–1.2)
BUN: 30 mg/dL — AB (ref 6–20)
CALCIUM: 9.7 mg/dL (ref 8.9–10.3)
CO2: 34 mmol/L — ABNORMAL HIGH (ref 22–32)
CREATININE: 1.3 mg/dL — AB (ref 0.61–1.24)
Chloride: 101 mmol/L (ref 101–111)
GFR calc Af Amer: 56 mL/min — ABNORMAL LOW (ref >60)
GFR, EST NON AFRICAN AMERICAN: 49 mL/min — AB (ref >60)
Glucose, Bld: 114 mg/dL — ABNORMAL HIGH (ref 65–99)
Potassium: 3.9 mmol/L (ref 3.5–5.1)
Sodium: 143 mmol/L (ref 135–145)
Total Protein: 8.1 g/dL (ref 6.5–8.1)

## 2017-07-09 LAB — CBC
HCT: 45.7 % (ref 39.0–52.0)
Hemoglobin: 14.7 g/dL (ref 13.0–17.0)
MCH: 28.3 pg (ref 26.0–34.0)
MCHC: 32.2 g/dL (ref 30.0–36.0)
MCV: 87.9 fL (ref 78.0–100.0)
Platelets: 98 10*3/uL — ABNORMAL LOW (ref 150–400)
RBC: 5.2 MIL/uL (ref 4.22–5.81)
RDW: 18.1 % — AB (ref 11.5–15.5)
WBC: 9 10*3/uL (ref 4.0–10.5)

## 2017-07-09 LAB — LACTATE DEHYDROGENASE: LDH: 215 U/L — AB (ref 98–192)

## 2017-07-09 LAB — PREALBUMIN: Prealbumin: 29.5 mg/dL (ref 18–38)

## 2017-07-09 LAB — TSH: TSH: 1.532 u[IU]/mL (ref 0.350–4.500)

## 2017-07-09 LAB — T4, FREE: FREE T4: 1.19 ng/dL — AB (ref 0.61–1.12)

## 2017-07-09 MED ORDER — TORSEMIDE 20 MG PO TABS: 40.0000 mg | ORAL_TABLET | Freq: Every day | ORAL | 3 refills | Status: DC

## 2017-07-09 NOTE — Patient Instructions (Signed)
Decrease lasix to 40 mg Once Daily  Labs today (will call for abnormal results, otherwise no news is good news)  Echocardiogram and follow up in 4-6 weeks.

## 2017-07-09 NOTE — Progress Notes (Signed)
Advanced Heart Failure Clinic Consult Note   Referring Physician: Dr. Irish Lack Primary Care: Penni Homans, MD Primary Cardiologist: Dr. Irish Lack ( Previously Mare Ferrari) Primary HF: New (Dr. Haroldine Laws)   HPI:  [name] [name] ([place]) is an 83 y.o. male with history of diastolic HF, PAF/Aflutter on coumadin, and h/o severe MV prolapse s/p MV repair 10/01/11 who is referred for further evaluation of his heart failure.   Pt seen in Dr. Irish Lack office 03/11/17 and noted to have worsening peripheral edema. Thought to be related to RV failure.  Pt admitted non-compliance with second lasix dose some days. RHC suggested but pt prefers to avoid invasive procedures at this time. Lasix increased and referred to [facility] for wound care of BLEs.   He established care with Dr. Haroldine Laws in June 2018. His lasix was switched to torsemide.   He was admitted 04/26/17-04/30/17 with a fall, no syncope. Also with right sided heart failure. RHC showed normal cardiac output. No evidence of PAH. Diuresed with IV lasix, weight down about 10 pounds. Discharge weight was 190 pounds. He was discharged to SNF.   He returns today for HF follow up. Overall feeling ok, he is going to be discharged from SNF [name]) in 3 days. No SOB with walking around the nursing home, walks with a walker. When I saw him last 2 months ago he was in a wheelchair. Weight markedly down. Weight when he left the hospital was 190 pounds, weight on 6/22 was 183 pounds, weight today down to 152 pounds. He says he is eating 3 meals a day at the nursing home. Drinking less than 2L a day.   Echo 12/19/16 with LVEF 50-55%, Mild AI, s/p MVR with mild residual MR, Mod LAE, Mod RV dilation, severe RAE, Severe TR.  Past Medical History:  Diagnosis Date  . Abnormality of gait 05/27/2016  . Adenomatous polyps   . Carpal tunnel syndrome 06/25/2016   Right  . Depressive disorder, not elsewhere classified   . First degree AV block    Holter 3/18: NSR,  PACs, PVCs, no AFib, no pauses.  Marland Kitchen Hereditary and idiopathic peripheral neuropathy 06/25/2016  . Hypertension   . Internal nasal lesion 05/15/2013  . Melanoma ([place])    Left Shoulder  . Mitral regurgitation   . MVP (mitral valve prolapse)    a. With severe MR s/p Complex valvuloplasty including artificial Gore-tex neochord placement x4, chordal transposition x1, chordal release x1, # 32 mm Sorin Memo 3D Ring Annuloplasty 2012. // b. Echo 2/18: mild LVH, EF 50-55, mild AI, MV repair with mild MR, mod LAE, mod RVE, severe RAE, severe TR  . Neuropathy   . Normal coronary arteries    a. Normal coronary anatomy by cath 2012.  . Osteoarthritis    Knees  . PAF (paroxysmal atrial fibrillation) ([place])    a. Post-op MVR 2012.  Marland Kitchen Personal history of colonic polyps   . Prostate cancer ([place])   . Pulmonary HTN ([place])    a. Mild-mod by cath 2012.  . Pure hypercholesterolemia   . PVC (premature ventricular contraction)   . Thrombocytopenia ([place])   . Vision abnormalities    Cornea scarring   Current Outpatient Prescriptions  Medication Sig Dispense Refill  . amiodarone (PACERONE) 200 MG tablet Take 200 mg by mouth daily.     Marland Kitchen b complex vitamins tablet Take 1 tablet by mouth daily.    Marland Kitchen CARTIA XT 300 MG 24 hr capsule Take 300 mg by mouth daily.     Marland Kitchen  collagenase (SANTYL) ointment Apply topically daily. 15 g 0  . cycloSPORINE (RESTASIS) 0.05 % ophthalmic emulsion Place 1 drop into both eyes 2 (two) times daily.    Marland Kitchen docusate sodium (COLACE) 100 MG capsule Take 100 mg by mouth daily.    Marland Kitchen gabapentin (NEURONTIN) 300 MG capsule Take 300 mg by mouth 2 (two) times daily.     Marland Kitchen glucosamine-chondroitin 500-400 MG tablet Take 1 tablet by mouth 2 (two) times daily.    . mirtazapine (REMERON) 15 MG tablet Take 15 mg by mouth at bedtime.    . Multiple Vitamin (MULTIVITAMIN WITH MINERALS) TABS tablet Take 1 tablet by mouth every evening.     Marland Kitchen MYRBETRIQ 50 MG TB24 tablet Take 1 tablet by mouth daily.    .  potassium chloride SA (K-DUR,KLOR-CON) 20 MEQ tablet Take 2 tablets (40 mEq total) by mouth daily. 180 tablet 3  . rosuvastatin (CRESTOR) 5 MG tablet Take 5 mg by mouth daily at 6 PM.     . TOPROL XL 50 MG 24 hr tablet Take 50 mg by mouth daily.     Marland Kitchen torsemide (DEMADEX) 20 MG tablet Take 2 tablets (40 mg total) by mouth 2 (two) times daily. 270 tablet 3  . traZODone (DESYREL) 100 MG tablet Take 300 mg by mouth at bedtime.     . vitamin C (ASCORBIC ACID) 500 MG tablet Take 500 mg by mouth 2 (two) times daily.    Marland Kitchen warfarin (COUMADIN) 2 MG tablet Take 2 mg by mouth daily.     No current facility-administered medications for this encounter.    No Known Allergies   Social History   Social History  . Marital status: Widowed    Spouse name: N/A  . Number of children: 2  . Years of education: 40   Occupational History  . Not on file.   Social History Main Topics  . Smoking status: Never Smoker  . Smokeless tobacco: Never Used  . Alcohol use No     Comment: Last drink in 2000  . Drug use: No  . Sexual activity: Not on file   Other Topics Concern  . Not on file   Social History Narrative   Retired - Optometrist   Widower   2 children   Drinks 1 cup of coffee per day   Family History  Problem Relation Age of Onset  . Clotting disorder Brother        CVA's  . Arthritis Mother   . Hypertension Mother   . Stroke Mother   . Hypertension Father   . Psychosis Father        psychiatric care  . Colon cancer Neg Hx   . Stomach cancer Neg Hx   . Heart attack Neg Hx    Vitals:   07/09/17 1448  BP: 119/67  Pulse: 68  SpO2: 94%  Weight: 152 lb 1.9 oz (69 kg)   Wt Readings from Last 3 Encounters:  07/09/17 152 lb 1.9 oz (69 kg)  05/08/17 183 lb (83 kg)  04/30/17 190 lb (86.2 kg)    PHYSICAL EXAM: General: Thin, elderly male, NAD. Walks with walker  HEENT: Normal x for temporal wasting.  Neck: supple. JVP 5-6 cm. Carotids 2+ bilat; no bruits. No lymphadenopathy or  thyromegaly appreciated. Cor: PMI nondisplaced. Regular rate and rhythm. 2/6 TR murmur.  Lungs: Clear bilaterally. Normal effort  Abdomen: Soft, nontender, + distended. No hepatosplenomegaly. No bruits or masses. Good bowel sounds. Extremities: no cyanosis, clubbing,  or rash. Trace pedal edema bilaterally.  Neuro: alert & oriented x 3, cranial nerves grossly intact. moves all 4 extremities w/o difficulty. Affect pleasant.  ASSESSMENT & PLAN:  1. Combined diastolic HF with R>L symptoms in the setting of longstanding MV disease.  - Echo 12/19/16 with LVEF 50-55%, Mild AI, s/p MVR with mild residual MR, Mod LAE, Mod RV dilation, severe RAE, Severe TR. - Repeat Echo.  - NYHA III - Volume dry on exam - I worry he might be getting too dry, also has lost 31 pounds in the past 2 months. Reduce torsemide to 40 mg daily.  - Continue Toprol XL 50 mg daily.   2. PAF - NSR by exam. Continue diltiazem 300mg  + Amio 200mg  daily.  - Continue warfarin for anticoagulation  3. Severe TR - As above, due to right sided heart failure.   4. PAH - last RHC without severe PAH.   5. Weight loss  - will check TSH, pre-albumin, CBC, Free T3, T4, LDH.   Follow up in one month with an Echo.    Arbutus Leas, NP 07/09/17    Patient seen and examined with [name], NP. We discussed all aspects of the encounter. I agree with the assessment and plan as stated above.   Volume status much improved but now appears cachetic. Remains quite weak. Will cut back diuretics and check a broad panel of labs. Encouraged him to drink Boost 1-2x/day. He is going home from SNF so we will arrange [facility] for HHPT/RN and home cardiac rehab program.Given age and fraility, not candidate for TVR.   [name], MD  9:50 PM

## 2017-07-10 LAB — T3, FREE: T3, Free: 2.1 pg/mL (ref 2.0–4.4)

## 2017-07-14 ENCOUNTER — Telehealth: Payer: Self-pay | Admitting: Family Medicine

## 2017-07-14 NOTE — Telephone Encounter (Signed)
Hermenia Fiscal Skilled nursing w/ [facility] - [phone]  Called in to request orders. She said that pt was in nursing facility for 2 months for congestive heart failure. She said that they would like to follow pt and continue working with him.   The frequency: 2 week 4 and 2 PRN

## 2017-07-15 ENCOUNTER — Telehealth: Payer: Self-pay | Admitting: Family Medicine

## 2017-07-15 NOTE — Telephone Encounter (Signed)
Called pt and left detailed message on machine I got word from OT about his BP being low  LMOM- wanted to check on how he is doing.  He is an advanced CHF clinic pt; balancing his medications can be complex.  Would suggest that he contact CHF nurse for further advice regarding his low BP, but we are also glad to help in any way

## 2017-07-15 NOTE — Telephone Encounter (Signed)
Caller name: Cindee Salt  Relation to pt: PT from [facility] back number: [phone]    D.O.D  Dr. Lorelei Pont   Reason for call:  OT wanted to inform D.O.D patient BP reading when sitting 90/60 and standing 70/40., as per OT please call patient at home and advise regarding BP reading   respiratory rate 12 temp 96.3 pulse 60

## 2017-07-15 NOTE — Telephone Encounter (Signed)
Spoke w/ Olin Hauser- [phone]- verbal orders given.

## 2017-07-15 NOTE — Telephone Encounter (Signed)
Tried again at 8:15 pm- did not reach.  Will try him again tomorrow

## 2017-07-16 ENCOUNTER — Telehealth: Payer: Self-pay | Admitting: *Deleted

## 2017-07-16 NOTE — Telephone Encounter (Signed)
Called again- still did not reach.  LMOM- please call us if any concerns or if BP still low

## 2017-07-16 NOTE — Telephone Encounter (Signed)
Received Physician Orders from [place]; forwarded to provider/SLS 08/30

## 2017-07-21 ENCOUNTER — Other Ambulatory Visit: Payer: Self-pay | Admitting: Cardiology

## 2017-07-22 ENCOUNTER — Telehealth: Payer: Self-pay | Admitting: Family Medicine

## 2017-07-22 ENCOUNTER — Telehealth: Payer: Self-pay | Admitting: *Deleted

## 2017-07-22 NOTE — Telephone Encounter (Signed)
Ok to give VO for home PT as requested

## 2017-07-22 NOTE — Telephone Encounter (Signed)
Caller name:Erik w/ [facility] Relationship to patient: Can be [phone] Pharmacy:  Reason for call:Needs new home health PT orders, 2x a week for 4 weeks. BP reading levels, when should they be reported to MD?

## 2017-07-22 NOTE — Telephone Encounter (Signed)
Received Medication Issue Communication/Physician Orders from [facility]; forwarded to provider/SLS 09/05

## 2017-07-23 ENCOUNTER — Telehealth: Payer: Self-pay | Admitting: *Deleted

## 2017-07-23 NOTE — Telephone Encounter (Signed)
Received Physician Orders from [place]; forwarded to provider/SLS 09/06

## 2017-07-24 ENCOUNTER — Telehealth: Payer: Self-pay | Admitting: Family Medicine

## 2017-07-24 NOTE — Telephone Encounter (Signed)
New Message  [place] verbalized needing Verbal Order for Occupational Therapy.  Two times a week for four weeks.  Please f/u

## 2017-07-24 NOTE — Telephone Encounter (Signed)
Called [facility] informed verbal ok per PCP ok for order as requested.

## 2017-07-27 ENCOUNTER — Ambulatory Visit (INDEPENDENT_AMBULATORY_CARE_PROVIDER_SITE_OTHER): Payer: Medicare Other | Admitting: *Deleted

## 2017-07-27 DIAGNOSIS — I481 Persistent atrial fibrillation: Secondary | ICD-10-CM | POA: Diagnosis not present

## 2017-07-27 DIAGNOSIS — I4891 Unspecified atrial fibrillation: Secondary | ICD-10-CM

## 2017-07-27 DIAGNOSIS — Z5181 Encounter for therapeutic drug level monitoring: Secondary | ICD-10-CM | POA: Diagnosis not present

## 2017-07-27 DIAGNOSIS — I4819 Other persistent atrial fibrillation: Secondary | ICD-10-CM

## 2017-07-27 LAB — POCT INR: INR: 1.8

## 2017-07-28 ENCOUNTER — Other Ambulatory Visit: Payer: Self-pay

## 2017-07-28 MED ORDER — AMIODARONE HCL 200 MG PO TABS: 200.0000 mg | ORAL_TABLET | Freq: Every day | ORAL | 11 refills | Status: DC

## 2017-07-29 ENCOUNTER — Telehealth: Payer: Self-pay | Admitting: *Deleted

## 2017-07-29 NOTE — Telephone Encounter (Signed)
Received Physician Orders from [place]; forwarded to provider/SLS 09/12

## 2017-08-04 ENCOUNTER — Telehealth: Payer: Self-pay | Admitting: Family Medicine

## 2017-08-04 NOTE — Telephone Encounter (Signed)
Caller name:Scott w/ [facility] Relationship to patient: Can be [phone] Pharmacy:  Reason for call:Requesting verbal orders for skilled nursing for 1x a week for 5 weeks to monitor weight and edema. Please advise

## 2017-08-05 NOTE — Telephone Encounter (Signed)
Scott w/ [facility] checking on the status of message mentioned below, please advise

## 2017-08-06 NOTE — Telephone Encounter (Signed)
Left detailed message on the confidential line of Scott giving verbal orders.   PC

## 2017-08-07 ENCOUNTER — Telehealth: Payer: Self-pay | Admitting: *Deleted

## 2017-08-07 NOTE — Telephone Encounter (Signed)
Received Physician Orders from [facility]; forwarded to provider/SLS 09/21  

## 2017-08-12 ENCOUNTER — Ambulatory Visit (INDEPENDENT_AMBULATORY_CARE_PROVIDER_SITE_OTHER): Payer: Medicare Other | Admitting: *Deleted

## 2017-08-12 DIAGNOSIS — I481 Persistent atrial fibrillation: Secondary | ICD-10-CM

## 2017-08-12 DIAGNOSIS — Z9889 Other specified postprocedural states: Secondary | ICD-10-CM

## 2017-08-12 DIAGNOSIS — I4892 Unspecified atrial flutter: Secondary | ICD-10-CM | POA: Diagnosis not present

## 2017-08-12 DIAGNOSIS — I4819 Other persistent atrial fibrillation: Secondary | ICD-10-CM

## 2017-08-12 DIAGNOSIS — I4891 Unspecified atrial fibrillation: Secondary | ICD-10-CM

## 2017-08-12 DIAGNOSIS — Z5181 Encounter for therapeutic drug level monitoring: Secondary | ICD-10-CM | POA: Diagnosis not present

## 2017-08-12 LAB — POCT INR: INR: 2.2

## 2017-08-13 ENCOUNTER — Telehealth: Payer: Self-pay | Admitting: *Deleted

## 2017-08-13 NOTE — Telephone Encounter (Signed)
Received Physician Orders from [place]; forwarded to provider/SLS 09/27

## 2017-08-17 ENCOUNTER — Telehealth: Payer: Self-pay | Admitting: Family Medicine

## 2017-08-17 NOTE — Telephone Encounter (Signed)
Cindee Salt at [facility] [phone] call for continuation of PT twice a week for four weeks. Request was faxed this morning but it has not been sent back to them yet.

## 2017-08-18 NOTE — Telephone Encounter (Signed)
Called and left message for verbal orders for PT to continue    [facility]

## 2017-08-19 ENCOUNTER — Other Ambulatory Visit (INDEPENDENT_AMBULATORY_CARE_PROVIDER_SITE_OTHER): Payer: Self-pay | Admitting: Physical Medicine and Rehabilitation

## 2017-08-19 NOTE — Telephone Encounter (Signed)
Dr. Ernestina Patches is out of the office until 10/10. This patient also sees Dr. Alvia Grove. Would one of them be able to do this refill?

## 2017-08-19 NOTE — Telephone Encounter (Signed)
Can you do this please

## 2017-08-20 ENCOUNTER — Other Ambulatory Visit (INDEPENDENT_AMBULATORY_CARE_PROVIDER_SITE_OTHER): Payer: Self-pay | Admitting: Orthopedic Surgery

## 2017-08-20 MED ORDER — GABAPENTIN 300 MG PO CAPS: 300.0000 mg | ORAL_CAPSULE | Freq: Two times a day (BID) | ORAL | 0 refills | Status: DC

## 2017-08-20 NOTE — Telephone Encounter (Signed)
I renewed it for one month

## 2017-09-01 ENCOUNTER — Telehealth: Payer: Self-pay | Admitting: *Deleted

## 2017-09-01 NOTE — Telephone Encounter (Signed)
Received Physician OT Orders from [place]; forwarded to provider/SLS 10/16

## 2017-09-01 NOTE — Telephone Encounter (Signed)
Per Dr. Charlett Blake she will sign form, however she will not be able to sign off on anything else unless patient has a visit with her.   Patient last office visit 01-2017 [place] office visit with PCP [phone]   Please see if they will bring him in

## 2017-09-02 ENCOUNTER — Ambulatory Visit (INDEPENDENT_AMBULATORY_CARE_PROVIDER_SITE_OTHER): Payer: Medicare Other | Admitting: *Deleted

## 2017-09-02 DIAGNOSIS — I4891 Unspecified atrial fibrillation: Secondary | ICD-10-CM | POA: Diagnosis not present

## 2017-09-02 DIAGNOSIS — I4892 Unspecified atrial flutter: Secondary | ICD-10-CM

## 2017-09-02 DIAGNOSIS — I481 Persistent atrial fibrillation: Secondary | ICD-10-CM | POA: Diagnosis not present

## 2017-09-02 DIAGNOSIS — Z9889 Other specified postprocedural states: Secondary | ICD-10-CM | POA: Diagnosis not present

## 2017-09-02 DIAGNOSIS — Z5181 Encounter for therapeutic drug level monitoring: Secondary | ICD-10-CM

## 2017-09-02 DIAGNOSIS — I4819 Other persistent atrial fibrillation: Secondary | ICD-10-CM

## 2017-09-02 LAB — POCT INR: INR: 1.8

## 2017-09-02 NOTE — Telephone Encounter (Signed)
Per provider, patient needs OV prior to signing off on any further forms; please contact patient and arrange appointment with Dr. Ezequiel Kayser to be with his PCP]. Thanks/SLS 10/17

## 2017-09-02 NOTE — Telephone Encounter (Signed)
Called pt at home and cell phone and LVM on home phone for pt to return call and to schedule an appt with provider. (See not below)

## 2017-09-03 ENCOUNTER — Other Ambulatory Visit: Payer: Self-pay | Admitting: Interventional Cardiology

## 2017-09-04 ENCOUNTER — Ambulatory Visit (HOSPITAL_BASED_OUTPATIENT_CLINIC_OR_DEPARTMENT_OTHER)
Admission: RE | Admit: 2017-09-04 | Discharge: 2017-09-04 | Disposition: A | Discharge status: Home / Self Care | Payer: Medicare Other | Source: Ambulatory Visit | Attending: Internal Medicine | Admitting: Internal Medicine

## 2017-09-04 ENCOUNTER — Ambulatory Visit (HOSPITAL_COMMUNITY)
Admission: RE | Admit: 2017-09-04 | Discharge: 2017-09-04 | Disposition: A | Discharge status: Home / Self Care | Payer: Medicare Other | Source: Ambulatory Visit | Attending: Internal Medicine | Admitting: Internal Medicine

## 2017-09-04 ENCOUNTER — Encounter (HOSPITAL_COMMUNITY): Payer: Self-pay | Admitting: Internal Medicine

## 2017-09-04 VITALS — BP 130/78 | HR 54 | Wt 164.0 lb

## 2017-09-04 DIAGNOSIS — I071 Rheumatic tricuspid insufficiency: Secondary | ICD-10-CM

## 2017-09-04 DIAGNOSIS — I083 Combined rheumatic disorders of mitral, aortic and tricuspid valves: Secondary | ICD-10-CM | POA: Diagnosis not present

## 2017-09-04 DIAGNOSIS — I5032 Chronic diastolic (congestive) heart failure: Secondary | ICD-10-CM | POA: Insufficient documentation

## 2017-09-04 DIAGNOSIS — I4892 Unspecified atrial flutter: Secondary | ICD-10-CM | POA: Diagnosis not present

## 2017-09-04 DIAGNOSIS — Z9889 Other specified postprocedural states: Secondary | ICD-10-CM

## 2017-09-04 LAB — COMPREHENSIVE METABOLIC PANEL
ALK PHOS: 56 U/L (ref 38–126)
ALT: 20 U/L (ref 17–63)
AST: 22 U/L (ref 15–41)
Albumin: 4 g/dL (ref 3.5–5.0)
Anion gap: 8 (ref 5–15)
BUN: 30 mg/dL — AB (ref 6–20)
CALCIUM: 9.2 mg/dL (ref 8.9–10.3)
CHLORIDE: 99 mmol/L — AB (ref 101–111)
CO2: 32 mmol/L (ref 22–32)
CREATININE: 1.52 mg/dL — AB (ref 0.61–1.24)
GFR, EST AFRICAN AMERICAN: 47 mL/min — AB (ref >60)
GFR, EST NON AFRICAN AMERICAN: 40 mL/min — AB (ref >60)
Glucose, Bld: 109 mg/dL — ABNORMAL HIGH (ref 65–99)
Potassium: 3.6 mmol/L (ref 3.5–5.1)
SODIUM: 139 mmol/L (ref 135–145)
Total Bilirubin: 0.7 mg/dL (ref 0.3–1.2)
Total Protein: 7.6 g/dL (ref 6.5–8.1)

## 2017-09-04 NOTE — Progress Notes (Signed)
  Echocardiogram 2D Echocardiogram has been performed.  [name] [name] 09/04/2017, 2:30 PM

## 2017-09-04 NOTE — Progress Notes (Signed)
Advanced Heart Failure Clinic Consult Note   Referring Physician: Dr. Irish [name] Primary Care: [name] Homans, MD Primary Cardiologist: [name] [name] ( Previously [name] [name]) Primary HF: New ([name] [name])   HPI: [name] Sato ([place]) is an 83 y.o. male with history of diastolic HF, PAF/Aflutter on coumadin, and h/o severe MV prolapse s/p MV repair 10/01/11 who is referred for further evaluation of his heart failure.   Pt seen in [name] [name] office 03/11/17 and noted to have worsening peripheral edema. Thought to be related to RV failure.  Pt admitted non-compliance with second lasix dose some days. RHC suggested but pt prefers to avoid invasive procedures at this time. Lasix increased and referred to [facility] for wound care of BLEs.   He established care with [name] [name] in June 2018. His lasix was switched to torsemide.   He was admitted 04/26/17-04/30/17 with a fall, no syncope. Also with right sided heart failure. RHC showed normal cardiac output. No evidence of PAH. Diuresed with IV lasix, weight down about 10 pounds. Discharge weight was 190 pounds. He was discharged to SNF.   Today he returns for HF follow up. Last visit torsemide was cut back to 40 mg daily. Says he will take an extra 40 mg torsemide for increased or leg edema. Weight at home 164-168 pounds. Denies SOB/Orthopnea. Ambulates at home with a walker. Taking al medications. Lives alone at home. He has an aide daily. He does not drive.    ECHO 09/04/2017  LVEF 55-60%  Severe TR, RV moderately to severely dilated. Flattened septum  Personally reviewed  Echo 12/19/16 with LVEF 50-55%, Mild AI, s/p MVR with mild residual MR, Mod LAE, Mod RV dilation, severe RAE, Severe TR.  RHC  RA = 8 RV = 36/7 PA = 33/4 (15) PCW = 6 Fick cardiac output/index = 7.7/3.6 PVR = 1.2 WU Ao sat = 99% PA sat = 69%, 72% SVC sat = 69%  Past Medical History:  Diagnosis Date  . Abnormality of gait 05/27/2016  . Adenomatous polyps   . Carpal  tunnel syndrome 06/25/2016   Right  . Depressive disorder, not elsewhere classified   . First degree AV block    Holter 3/18: NSR, PACs, PVCs, no AFib, no pauses.  Marland Kitchen Hereditary and idiopathic peripheral neuropathy 06/25/2016  . Hypertension   . Internal nasal lesion 05/15/2013  . Melanoma ([place])    Left Shoulder  . Mitral regurgitation   . MVP (mitral valve prolapse)    a. With severe MR s/p Complex valvuloplasty including artificial Gore-tex neochord placement x4, chordal transposition x1, chordal release x1, # 32 mm Sorin Memo 3D Ring Annuloplasty 2012. // b. Echo 2/18: mild LVH, EF 50-55, mild AI, MV repair with mild MR, mod LAE, mod RVE, severe RAE, severe TR  . Neuropathy   . Normal coronary arteries    a. Normal coronary anatomy by cath 2012.  . Osteoarthritis    Knees  . PAF (paroxysmal atrial fibrillation) ([place])    a. Post-op MVR 2012.  Marland Kitchen Personal history of colonic polyps   . Prostate cancer ([place])   . Pulmonary HTN ([place])    a. Mild-mod by cath 2012.  . Pure hypercholesterolemia   . PVC (premature ventricular contraction)   . Thrombocytopenia ([place])   . Vision abnormalities    Cornea scarring   Current Outpatient Prescriptions  Medication Sig Dispense Refill  . amiodarone (PACERONE) 200 MG tablet Take 1 tablet (200 mg total) by mouth daily. 30 tablet 11  .  b complex vitamins tablet Take 1 tablet by mouth daily.    Marland Kitchen CARTIA XT 300 MG 24 hr capsule Take 300 mg by mouth daily.     . collagenase (SANTYL) ointment Apply topically daily. 15 g 0  . cycloSPORINE (RESTASIS) 0.05 % ophthalmic emulsion Place 1 drop into both eyes 2 (two) times daily.    Marland Kitchen docusate sodium (COLACE) 100 MG capsule Take 100 mg by mouth daily.    Marland Kitchen gabapentin (NEURONTIN) 300 MG capsule Take 1 capsule (300 mg total) by mouth 2 (two) times daily. 60 capsule 0  . glucosamine-chondroitin 500-400 MG tablet Take 1 tablet by mouth 2 (two) times daily.    . mirtazapine (REMERON) 15 MG tablet Take 15 mg by mouth at  bedtime.    . Multiple Vitamin (MULTIVITAMIN WITH MINERALS) TABS tablet Take 1 tablet by mouth every evening.     Marland Kitchen MYRBETRIQ 50 MG TB24 tablet Take 1 tablet by mouth daily.    . potassium chloride SA (K-DUR,KLOR-CON) 20 MEQ tablet Take 2 tablets (40 mEq total) by mouth daily. 180 tablet 3  . rosuvastatin (CRESTOR) 5 MG tablet Take 5 mg by mouth daily at 6 PM.     . TOPROL XL 50 MG 24 hr tablet Take 50 mg by mouth daily.     Marland Kitchen torsemide (DEMADEX) 20 MG tablet Take 2 tablets (40 mg total) by mouth daily. 180 tablet 3  . traZODone (DESYREL) 100 MG tablet Take 300 mg by mouth at bedtime.     . vitamin C (ASCORBIC ACID) 500 MG tablet Take 500 mg by mouth 2 (two) times daily.    Marland Kitchen warfarin (COUMADIN) 2 MG tablet Take 2 mg by mouth daily.     No current facility-administered medications for this encounter.    No Known Allergies   Social History   Social History  . Marital status: Widowed    Spouse name: N/A  . Number of children: 2  . Years of education: 56   Occupational History  . Not on file.   Social History Main Topics  . Smoking status: Never Smoker  . Smokeless tobacco: Never Used  . Alcohol use No     Comment: Last drink in 2000  . Drug use: No  . Sexual activity: Not on file   Other Topics Concern  . Not on file   Social History Narrative   Retired - Optometrist   Widower   2 children   Drinks 1 cup of coffee per day   Family History  Problem Relation Age of Onset  . Clotting disorder Brother        CVA's  . Arthritis Mother   . Hypertension Mother   . Stroke Mother   . Hypertension Father   . Psychosis Father        psychiatric care  . Colon cancer Neg Hx   . Stomach cancer Neg Hx   . Heart attack Neg Hx    Vitals:   09/04/17 1420  BP: 130/78  Pulse: (!) 54  SpO2: 95%  Weight: 164 lb (74.4 kg)   Wt Readings from Last 3 Encounters:  09/04/17 164 lb (74.4 kg)  07/09/17 152 lb 1.9 oz (69 kg)  05/08/17 183 lb (83 kg)    PHYSICAL EXAM: General:   Thin elderly. Arrived in a wheel chair. No resp difficulty HEENT: normal Neck: supple. JVD ~8-9 prominent CV waves.  Carotids 2+ bilat; no bruits. No lymphadenopathy or thryomegaly appreciated. Cor: PMI nondisplaced.  Irregular rate & rhythm. No rubs, gallops . 2/6 TR murmur. Lungs: clear Abdomen: soft, nontender, nondistended. No hepatosplenomegaly. No bruits or masses. Good bowel sounds. Extremities: no cyanosis, clubbing, rash, R and LLE trace-1+ edema around the ankles. Chronic hyperpigmentation noted on RLE and LLE.  Neuro: alert & orientedx3, cranial nerves grossly intact. moves all 4 extremities w/o difficulty. Affect pleasant    ASSESSMENT & PLAN: 1. Combined diastolic HF with R>L symptoms in the setting of longstanding MV disease.  -Continue torsemide to 40 mg daily and 40 mg if needed.  - Continue Toprol XL 50 mg daily.    2. PAF - Irregular on exam but in sinus on echo -Continue diltiazem 300mg  + Amio 200mg  daily.  - Continue warfarin for anticoagulation  3. Severe TR - As above, due to right sided heart failure.   4. PAH - last RHC without severe PAH.   5. Severe MVP S/P Bioprosthetic MVR   Todays ECHO was discussed and reviewed by Dr Haroldine [name]    Follow up in 2 months.   [name] Grinder, NP 09/04/17   Patient seen and examined with [name] Grinder, NP. We discussed all aspects of the encounter. I agree with the assessment and plan as stated above.   Echo reviewed personally. He has prominent RV failure in the setting of previous MV repair. He manages his volume status well with sliding scale diuretics. Not candidate for TVR due to age and fraility. Continue symptomatic management.   [name] Bickers, MD  3:02 PM

## 2017-09-04 NOTE — Patient Instructions (Signed)
Labs today  We will contact you in 3 months to schedule your next appointment.  

## 2017-09-08 ENCOUNTER — Other Ambulatory Visit (HOSPITAL_COMMUNITY): Payer: Self-pay | Admitting: *Deleted

## 2017-09-08 MED ORDER — TORSEMIDE 20 MG PO TABS: 40.0000 mg | ORAL_TABLET | Freq: Every day | ORAL | 3 refills | Status: DC

## 2017-09-15 ENCOUNTER — Encounter (INDEPENDENT_AMBULATORY_CARE_PROVIDER_SITE_OTHER): Payer: Self-pay | Admitting: Physical Medicine and Rehabilitation

## 2017-09-15 ENCOUNTER — Ambulatory Visit (INDEPENDENT_AMBULATORY_CARE_PROVIDER_SITE_OTHER): Payer: Medicare Other | Admitting: Physical Medicine and Rehabilitation

## 2017-09-15 VITALS — BP 114/65 | HR 55

## 2017-09-15 DIAGNOSIS — M545 Low back pain, unspecified: Secondary | ICD-10-CM

## 2017-09-15 DIAGNOSIS — M5136 Other intervertebral disc degeneration, lumbar region: Secondary | ICD-10-CM

## 2017-09-15 DIAGNOSIS — G8929 Other chronic pain: Secondary | ICD-10-CM

## 2017-09-15 DIAGNOSIS — M47816 Spondylosis without myelopathy or radiculopathy, lumbar region: Secondary | ICD-10-CM | POA: Diagnosis not present

## 2017-09-15 NOTE — Progress Notes (Deleted)
Here to follow up before gabapentin refill. Says it seems to be helping- no significant back pain. No leg pain or numbness.

## 2017-09-16 ENCOUNTER — Encounter (INDEPENDENT_AMBULATORY_CARE_PROVIDER_SITE_OTHER): Payer: Self-pay | Admitting: Physical Medicine and Rehabilitation

## 2017-09-16 DIAGNOSIS — M47816 Spondylosis without myelopathy or radiculopathy, lumbar region: Secondary | ICD-10-CM | POA: Insufficient documentation

## 2017-09-16 MED ORDER — GABAPENTIN 300 MG PO CAPS: 300.0000 mg | ORAL_CAPSULE | Freq: Two times a day (BID) | ORAL | 3 refills | Status: DC

## 2017-09-16 NOTE — Progress Notes (Signed)
[name] [name] - 83 y.o. male MRN [id number]  Date of birth: 03/25/1931  Office Visit Note: Visit Date: 09/15/2017 PCP: [name] Lukes, MD Referred by: [name] Lukes, MD  Subjective: Chief Complaint  Patient presents with  . Lower Back - Pain   HPI: [name] [name] is an 83 year old gentleman with chronic low back pain who is status post radiofrequency ablation over a year and a half ago of the lumbar spine with some relief of his pain.  He had most of his benefit from increasing gabapentin to the point now where he is taking 300 mg twice a day.  He is tolerating that quite well.  His case is complicated by significant congestive heart failure.  He is on anticoagulants.  He does get a lot of swelling in the legs but is not noticed any new swelling from the gabapentin.  Overall he feels he is doing fairly well.  He is not ambulating as well as he would like but he continues to try to stay active when he can.  The last time I saw him we did refer him to [facility] to see a physician assistant water who I think work for 1 of the physiatry [place] there.  They were advertising radiofrequency ablation for her knee arthritis.  Go there and evaluated and they did a test injection which did not help very much.  Talk to the patient prior to him going there and told her that I had tried ablation of the knee on a few occasions and just not had seen it worked very well although I think it hold some promise.  We discussed that briefly.  He said no new issues or falls or trauma.  Continues to have low back pain but manageable with his current medication regimen.    Review of Systems  Constitutional: Positive for malaise/fatigue. Negative for chills, fever and weight loss.  HENT: Negative for hearing loss and sinus pain.   Eyes: Negative for blurred vision, double vision and photophobia.  Respiratory: Negative for cough and shortness of breath.   Cardiovascular: Negative for chest pain, palpitations and  leg swelling.  Gastrointestinal: Negative for abdominal pain, nausea and vomiting.  Genitourinary: Negative for flank pain.  Musculoskeletal: Positive for back pain and joint pain. Negative for myalgias.  Skin: Negative for itching and rash.  Neurological: Positive for weakness. Negative for tremors and focal weakness.  Endo/Heme/Allergies: Negative.   Psychiatric/Behavioral: Negative for depression.  All other systems reviewed and are negative.  Otherwise per HPI.  Assessment & Plan: Visit Diagnoses: No diagnosis found.  Plan: Findings:  Chronic chemical low back pain which is related to degenerative spine and facet arthritis.  Stenosis or nerve he does have continued knee pain as well.  He is not as mobile as he would like due to congestive heart failure.  Tolerating the gabapentin at this point and is on a fairly decent dose at 300 mg twice a day.  We told him he could increase this to 3 times per day to see if it gave him any more benefit.  He may get some longer effective that given he probably has mild kidney insufficiency has gotten older.  We will go ahead and refill the medication today we will see him back as needed.    Meds & Orders:  Meds ordered this encounter  Medications  . gabapentin (NEURONTIN) 300 MG capsule    Sig: Take 1 capsule (300 mg total) by mouth 2 (two)  times daily.    Dispense:  180 capsule    Refill:  3   No orders of the defined types were placed in this encounter.   Follow-up: Return if symptoms worsen or fail to improve.   Procedures: No procedures performed  No notes on file   Clinical History: MRI LUMBAR SPINE WITHOUT CONTRAST  TECHNIQUE: Multiplanar, multisequence MR imaging of the lumbar spine was performed. No intravenous contrast was administered.  COMPARISON:  Lumbar radiographs 03/2010.  Lumbar MRI 11/13/2003  FINDINGS: Normal lumbar alignment. Negative for fracture or mass lesion. Schmorl's node superior endplate of L4. Conus  medullaris is normal and terminates at L1-2  L1-2:  Mild disc and facet degeneration without stenosis  L2-3: Disc degeneration and spondylosis. Bilateral facet hypertrophy with mild spinal stenosis.  L3-4: Disc degeneration and spondylosis. Bilateral facet hypertrophy with mild spinal stenosis. Mild to moderate right foraminal encroachment.  L4-5: Mild disc bulging and spondylosis. Bilateral facet hypertrophy and degenerative change right greater than left. Synovial cyst on the right projects into the spinal canal and is likely causing impingement of the right L5 nerve root in the subarticular zone. In addition there is right foraminal encroachment and impingement of the right L4 nerve root. Mild left foraminal narrowing. Moderate spinal stenosis  L5-S1: Mild disc degeneration with moderate facet degeneration. No significant stenosis  IMPRESSION: Mild spinal stenosis L2-3 and L3-4 with progressive degenerative change since 2004  Moderate spinal stenosis L4-5. 10 mm right facet synovial cyst projecting into the subarticular zone and spinal canal causing impingement of the right L4 and L5 nerve roots.   Electronically Signed   By: Franchot Gallo M.D.   On: 03/20/2015 18:30  He reports that he has never smoked. He has never used smokeless tobacco. No results for input(s): HGBA1C, LABURIC in the last 8760 hours.  Objective:  VS:  HT:    WT:   BMI:     BP:114/65  HR:(!) 55bpm  TEMP: ( )  RESP:  Physical Exam  Constitutional: He is oriented to person, place, and time. He appears well-developed and well-nourished. No distress.  HENT:  Head: Normocephalic and atraumatic.  Eyes: Pupils are equal, round, and reactive to light. Conjunctivae are normal.  Neck: Normal range of motion. Neck supple.  Cardiovascular: Regular rhythm and intact distal pulses.   Pulmonary/Chest: Effort normal. No respiratory distress.  Musculoskeletal:  Patient is seated in a wheelchair  that he only used his office for ease of ambulation.  Can arise from a seated position but is very slow.  He has pain with extension of the lumbar spine.  He has pain over the greater trochanters to a degree.  He has good distal strength bilaterally but he does have significant edema bilaterally.  Neurological: He is alert and oriented to person, place, and time.  Skin: Skin is warm and dry. No rash noted. No erythema.  Psychiatric: He has a normal mood and affect.  Nursing note and vitals reviewed.   Ortho Exam Imaging: No results found.  Past Medical/Family/Surgical/Social History: Medications & Allergies reviewed per EMR Patient Active Problem List   Diagnosis Date Noted  . Hypokalemia 04/30/2017  . Leg wound, left, sequela 04/30/2017  . Unstageable pressure ulcer of sacral region ([place]) 04/30/2017  . Fall   . Pressure injury of skin 04/29/2017  . Acute on chronic diastolic (congestive) heart failure ([place])   . PAH (pulmonary artery hypertension) ([place])   . Right-sided heart failure ([place]) 04/26/2017  . Chronic diastolic heart  failure ([place]) 04/21/2017  . Pulmonary hypertension, primary ([place]) 04/21/2017  . Severe tricuspid regurgitation 03/12/2017  . Bilateral lower extremity edema 02/25/2017  . Anticoagulated 02/25/2017  . Varicose veins of right lower extremity with complications 05/16/1600  . Obstructive lung disease (generalized) ([place]) 09/26/2016  . Carpal tunnel syndrome 06/25/2016  . Hereditary and idiopathic peripheral neuropathy 06/25/2016  . Paresthesia 05/27/2016  . Abnormality of gait 05/27/2016  . Chest x-ray abnormality 01/09/2016  . Paroxysmal atrial flutter ([place]) 12/05/2015  . Constipation 06/13/2015  . Encounter for therapeutic drug monitoring 05/25/2014  . Syncope 04/03/2014  . Rhabdomyolysis 04/03/2014  . UTI (urinary tract infection) 04/03/2014  . Acute renal failure ([place]) 04/03/2014  . Leukocytosis 04/03/2014  . Internal nasal lesion 05/15/2013  . Low back  pain 04/19/2013  . Melanoma ([place]) 09/30/2012  . Cough 03/26/2012  . Hx of mitral valve repair 11/19/2011  . Long term (current) use of anticoagulants 11/03/2011  . Decubitus skin ulcer 10/28/2011  . Pleural effusion due to congestive heart failure ([place]) 10/28/2011  . Atrial fibrillation ([place]) 10/07/2011  . S/P mitral valve repair 10/01/2011  . Valvular heart disease 08/21/2011  . Cerumen impaction 04/09/2011  . Hearing loss 04/09/2011  . THROMBOCYTOPENIA 09/19/2010  . ADENOCARCINOMA, PROSTATE 09/17/2010  . CALLUS, LEFT FOOT 09/17/2010  . BACK PAIN, CHRONIC 09/17/2010  . TINEA PEDIS 05/28/2009  . DERMATITIS, ATOPIC 04/10/2009  . HYPERCHOLESTEROLEMIA 06/09/2008  . DEPRESSION 06/09/2008  . HYPERTENSION, BENIGN ESSENTIAL 06/09/2008  . GERD 06/09/2008  . OSTEOARTHRITIS, GENERALIZED, MULTIPLE JOINTS 06/09/2008  . MUSCLE SPASM, BACK 06/09/2008  . PERSONAL HISTORY MALIGNANT NEOPLASM PROSTATE 06/09/2008  . PERSONAL HISTORY OF MALIGNANT MELANOMA OF SKIN 06/09/2008  . ARRHYTHMIA, HX OF 06/09/2008  . Personal history of colonic polyps 06/09/2008   Past Medical History:  Diagnosis Date  . Abnormality of gait 05/27/2016  . Adenomatous polyps   . Carpal tunnel syndrome 06/25/2016   Right  . Depressive disorder, not elsewhere classified   . First degree AV block    Holter 3/18: NSR, PACs, PVCs, no AFib, no pauses.  Marland Kitchen Hereditary and idiopathic peripheral neuropathy 06/25/2016  . Hypertension   . Internal nasal lesion 05/15/2013  . Melanoma ([place])    Left Shoulder  . Mitral regurgitation   . MVP (mitral valve prolapse)    a. With severe MR s/p Complex valvuloplasty including artificial Gore-tex neochord placement x4, chordal transposition x1, chordal release x1, # 32 mm Sorin Memo 3D Ring Annuloplasty 2012. // b. Echo 2/18: mild LVH, EF 50-55, mild AI, MV repair with mild MR, mod LAE, mod RVE, severe RAE, severe TR  . Neuropathy   . Normal coronary arteries    a. Normal coronary anatomy by  cath 2012.  . Osteoarthritis    Knees  . PAF (paroxysmal atrial fibrillation) ([place])    a. Post-op MVR 2012.  Marland Kitchen Personal history of colonic polyps   . Prostate cancer ([place])   . Pulmonary HTN ([place])    a. Mild-mod by cath 2012.  . Pure hypercholesterolemia   . PVC (premature ventricular contraction)   . Thrombocytopenia ([place])   . Vision abnormalities    Cornea scarring   Family History  Problem Relation Age of Onset  . Clotting disorder Brother        CVA's  . Arthritis Mother   . Hypertension Mother   . Stroke Mother   . Hypertension Father   . Psychosis Father        psychiatric care  . Colon cancer  Neg Hx   . Stomach cancer Neg Hx   . Heart attack Neg Hx    Past Surgical History:  Procedure Laterality Date  . CARDIAC CATHETERIZATION  09/2011   Pre-op for MVR -- normal coronaries.  Marland Kitchen CARDIOVERSION N/A 01/02/2016   Procedure: CARDIOVERSION;  Surgeon: Thayer Headings, MD;  Location: [facility] ENDOSCOPY;  Service: Cardiovascular;  Laterality: N/A;  . COLONOSCOPY W/ POLYPECTOMY    . INGUINAL HERNIA REPAIR  09/2009   Left  . KNEE ARTHROSCOPY      left x3  and right x2  . Melanoma Surgery     2001, 2005, 2006, 2009  . MITRAL VALVE REPAIR  10/01/2011   complex valvuloplasty with Goretex cord replacement and chordal transposition 51mm Sorin Memo 3D ring annuloplasty  . Nuclear Stress Test  09/2006   EF-64%, Normal  . PROSTATECTOMY  1993  . RIGHT HEART CATH N/A 04/29/2017   Procedure: Right Heart Cath;  Surgeon: Jolaine Artist, MD;  Location: [facility] LAB;  Service: Cardiovascular;  Laterality: N/A;  . ROOT CANAL  08-19-12  . ROTATOR CUFF REPAIR  2003   left  . TEE WITHOUT CARDIOVERSION  09/26/2011   Procedure: TRANSESOPHAGEAL ECHOCARDIOGRAM (TEE);  Surgeon: Lelon Perla, MD;  Location: [facility] ENDOSCOPY;  Service: Cardiovascular;  Laterality: N/A;  . US ECHOCARDIOGRAPHY  09/2009, 08/1011   mild LVH,mild AI,MVP with mild MR, mild-mod. TR with mild Pulm. HTN, EF-55-60%    Social History   Occupational History  . Not on file.   Social History Main Topics  . Smoking status: Never Smoker  . Smokeless tobacco: Never Used  . Alcohol use No     Comment: Last drink in 2000  . Drug use: No  . Sexual activity: Not on file

## 2017-09-22 ENCOUNTER — Telehealth: Payer: Self-pay | Admitting: Family

## 2017-09-22 ENCOUNTER — Encounter: Payer: Self-pay | Admitting: Family

## 2017-09-22 ENCOUNTER — Ambulatory Visit: Payer: Medicare Other | Admitting: Family

## 2017-09-22 ENCOUNTER — Ambulatory Visit (HOSPITAL_BASED_OUTPATIENT_CLINIC_OR_DEPARTMENT_OTHER)
Admission: RE | Admit: 2017-09-22 | Discharge: 2017-09-22 | Disposition: A | Discharge status: Home / Self Care | Payer: Medicare Other | Source: Ambulatory Visit | Attending: Family | Admitting: Family

## 2017-09-22 VITALS — BP 90/53 | HR 62 | Temp 97.5°F | Resp 16 | Ht 77.0 in | Wt 170.0 lb

## 2017-09-22 DIAGNOSIS — J9811 Atelectasis: Secondary | ICD-10-CM | POA: Diagnosis not present

## 2017-09-22 DIAGNOSIS — R634 Abnormal weight loss: Secondary | ICD-10-CM | POA: Insufficient documentation

## 2017-09-22 DIAGNOSIS — I700 Atherosclerosis of aorta: Secondary | ICD-10-CM | POA: Insufficient documentation

## 2017-09-22 DIAGNOSIS — R4182 Altered mental status, unspecified: Secondary | ICD-10-CM

## 2017-09-22 DIAGNOSIS — D509 Iron deficiency anemia, unspecified: Secondary | ICD-10-CM

## 2017-09-22 LAB — POC URINALSYSI DIPSTICK (AUTOMATED)
BILIRUBIN UA: NEGATIVE
GLUCOSE UA: NEGATIVE
KETONES UA: NEGATIVE
Leukocytes, UA: NEGATIVE
Nitrite, UA: NEGATIVE
PH UA: 6 (ref 5.0–8.0)
PROTEIN UA: NEGATIVE
RBC UA: NEGATIVE
SPEC GRAV UA: 1.015 (ref 1.010–1.025)
Urobilinogen, UA: 0.2 E.U./dL

## 2017-09-22 LAB — COMPREHENSIVE METABOLIC PANEL
ALT: 16 U/L (ref 0–53)
AST: 16 U/L (ref 0–37)
Albumin: 3.8 g/dL (ref 3.5–5.2)
Alkaline Phosphatase: 49 U/L (ref 39–117)
BILIRUBIN TOTAL: 0.8 mg/dL (ref 0.2–1.2)
BUN: 36 mg/dL — ABNORMAL HIGH (ref 6–23)
CO2: 32 meq/L (ref 19–32)
Calcium: 9.8 mg/dL (ref 8.4–10.5)
Chloride: 102 mEq/L (ref 96–112)
Creatinine, Ser: 1.73 mg/dL — ABNORMAL HIGH (ref 0.40–1.50)
GFR: 40.15 mL/min — AB (ref >60.00)
GLUCOSE: 90 mg/dL (ref 70–99)
Potassium: 3.9 mEq/L (ref 3.5–5.1)
Sodium: 140 mEq/L (ref 135–145)
Total Protein: 7.2 g/dL (ref 6.0–8.3)

## 2017-09-22 LAB — CBC WITH DIFFERENTIAL/PLATELET
BASOS PCT: 0.6 % (ref 0.0–3.0)
Basophils Absolute: 0 10*3/uL (ref 0.0–0.1)
EOS ABS: 0 10*3/uL (ref 0.0–0.7)
EOS PCT: 0.2 % (ref 0.0–5.0)
HCT: 37.9 % — ABNORMAL LOW (ref 39.0–52.0)
Hemoglobin: 12.2 g/dL — ABNORMAL LOW (ref 13.0–17.0)
LYMPHS PCT: 17.2 % (ref 12.0–46.0)
Lymphs Abs: 1.1 10*3/uL (ref 0.7–4.0)
MCHC: 32.3 g/dL (ref 30.0–36.0)
MCV: 84.3 fl (ref 78.0–100.0)
Monocytes Absolute: 1.4 10*3/uL — ABNORMAL HIGH (ref 0.1–1.0)
NEUTROS ABS: 3.7 10*3/uL (ref 1.4–7.7)
Neutrophils Relative %: 60.1 % (ref 43.0–77.0)
PLATELETS: 70 10*3/uL — AB (ref 150.0–400.0)
RBC: 4.5 Mil/uL (ref 4.22–5.81)
RDW: 16.6 % — AB (ref 11.5–15.5)
WBC: 6.2 10*3/uL (ref 4.0–10.5)

## 2017-09-22 LAB — TSH: TSH: 1.76 u[IU]/mL (ref 0.35–4.50)

## 2017-09-22 NOTE — Progress Notes (Signed)
Subjective:    Patient ID: [name] [name], male    DOB: 06-14-1931, 83 y.o.   MRN: [id number]  HPI  [name] [name] is an 83 yr old male who presents today due to increased confusion which was noted by his home health RN.  He has not been vomiting since his hospitalization back in June for acute on chronic diastolic heart failure.  Following his discharge he was sent to a skilled nursing facility.  He is now at home and he has hired home health aide helping him.  The patient lives alone.  He has 9 hours a day with help.  Apparently his home health workers noted that he had increased confusion recently.  The patient denies any symptoms and states that he himself does not feel confused.  Reports that he fell while standing by the vanity. Did not hit his head.  Has 9 hours a day help.     Review of Systems  Constitutional: Positive for unexpected weight change.  HENT: Negative for rhinorrhea.   Respiratory: Negative for cough and shortness of breath.   Cardiovascular: Negative for chest pain.  Gastrointestinal: Positive for constipation. Negative for abdominal pain and diarrhea.  Genitourinary: Negative for dysuria and frequency.  Musculoskeletal: Positive for arthralgias.  Neurological: Negative for headaches.  Hematological: Negative for adenopathy.  Psychiatric/Behavioral:       Denies depression   See HPI  Past Medical History:  Diagnosis Date  . Abnormality of gait 05/27/2016  . Adenomatous polyps   . Carpal tunnel syndrome 06/25/2016   Right  . Depressive disorder, not elsewhere classified   . First degree AV block    Holter 3/18: NSR, PACs, PVCs, no AFib, no pauses.  Marland Kitchen Hereditary and idiopathic peripheral neuropathy 06/25/2016  . Hypertension   . Internal nasal lesion 05/15/2013  . Melanoma ([place])    Left Shoulder  . Mitral regurgitation   . MVP (mitral valve prolapse)    a. With severe MR s/p Complex valvuloplasty including artificial Gore-tex neochord placement x4, chordal  transposition x1, chordal release x1, # 32 mm Sorin Memo 3D Ring Annuloplasty 2012. // b. Echo 2/18: mild LVH, EF 50-55, mild AI, MV repair with mild MR, mod LAE, mod RVE, severe RAE, severe TR  . Neuropathy   . Normal coronary arteries    a. Normal coronary anatomy by cath 2012.  . Osteoarthritis    Knees  . PAF (paroxysmal atrial fibrillation) ([place])    a. Post-op MVR 2012.  Marland Kitchen Personal history of colonic polyps   . Prostate cancer ([place])   . Pulmonary HTN ([place])    a. Mild-mod by cath 2012.  . Pure hypercholesterolemia   . PVC (premature ventricular contraction)   . Thrombocytopenia ([place])   . Vision abnormalities    Cornea scarring     Social History   Socioeconomic History  . Marital status: Widowed    Spouse name: Not on file  . Number of children: 2  . Years of education: 24  . Highest education level: Not on file  Social Needs  . Financial resource strain: Not on file  . Food insecurity - worry: Not on file  . Food insecurity - inability: Not on file  . Transportation needs - medical: Not on file  . Transportation needs - non-medical: Not on file  Occupational History  . Not on file  Tobacco Use  . Smoking status: Never Smoker  . Smokeless tobacco: Never Used  Substance and Sexual Activity  .  Alcohol use: No    Alcohol/week: 0.0 oz    Comment: Last drink in 2000  . Drug use: No  . Sexual activity: Not on file  Other Topics Concern  . Not on file  Social History Narrative   Retired - Optometrist   Widower   2 children   Drinks 1 cup of coffee per day    Past Surgical History:  Procedure Laterality Date  . CARDIAC CATHETERIZATION  09/2011   Pre-op for MVR -- normal coronaries.  . COLONOSCOPY W/ POLYPECTOMY    . INGUINAL HERNIA REPAIR  09/2009   Left  . KNEE ARTHROSCOPY      left x3  and right x2  . Melanoma Surgery     2001, 2005, 2006, 2009  . MITRAL VALVE REPAIR  10/01/2011   complex valvuloplasty with Goretex cord replacement and chordal  transposition 32mm Sorin Memo 3D ring annuloplasty  . Nuclear Stress Test  09/2006   EF-64%, Normal  . PROSTATECTOMY  1993  . ROOT CANAL  08-19-12  . ROTATOR CUFF REPAIR  2003   left  . US ECHOCARDIOGRAPHY  09/2009, 08/1011   mild LVH,mild AI,MVP with mild MR, mild-mod. TR with mild Pulm. HTN, EF-55-60%    Family History  Problem Relation Age of Onset  . Clotting disorder Brother        CVA's  . Arthritis Mother   . Hypertension Mother   . Stroke Mother   . Hypertension Father   . Psychosis Father        psychiatric care  . Colon cancer Neg Hx   . Stomach cancer Neg Hx   . Heart attack Neg Hx     No Known Allergies  Current Outpatient Medications on File Prior to Visit  Medication Sig Dispense Refill  . amiodarone (PACERONE) 200 MG tablet Take 1 tablet (200 mg total) by mouth daily. 30 tablet 11  . b complex vitamins tablet Take 1 tablet by mouth daily.    . collagenase (SANTYL) ointment Apply topically daily. 15 g 0  . cycloSPORINE (RESTASIS) 0.05 % ophthalmic emulsion Place 1 drop into both eyes 2 (two) times daily.    Marland Kitchen diltiazem (CARTIA XT) 300 MG 24 hr capsule Take 1 capsule (300 mg total) by mouth daily. 90 capsule 1  . docusate sodium (COLACE) 100 MG capsule Take 100 mg by mouth daily.    Marland Kitchen gabapentin (NEURONTIN) 300 MG capsule Take 1 capsule (300 mg total) by mouth 2 (two) times daily. 180 capsule 3  . glucosamine-chondroitin 500-400 MG tablet Take 1 tablet by mouth 2 (two) times daily.    . mirtazapine (REMERON) 15 MG tablet Take 15 mg by mouth at bedtime.    . Multiple Vitamin (MULTIVITAMIN WITH MINERALS) TABS tablet Take 1 tablet by mouth every evening.     Marland Kitchen MYRBETRIQ 50 MG TB24 tablet Take 1 tablet by mouth daily.    . potassium chloride SA (K-DUR,KLOR-CON) 20 MEQ tablet Take 2 tablets (40 mEq total) by mouth daily. 180 tablet 3  . rosuvastatin (CRESTOR) 5 MG tablet Take 5 mg by mouth daily at 6 PM.     . TOPROL XL 50 MG 24 hr tablet Take 50 mg by mouth daily.      Marland Kitchen torsemide (DEMADEX) 20 MG tablet Take 2 tablets (40 mg total) by mouth daily. 180 tablet 3  . traZODone (DESYREL) 100 MG tablet Take 300 mg by mouth at bedtime.     . vitamin C (ASCORBIC ACID)  500 MG tablet Take 500 mg by mouth 2 (two) times daily.    Marland Kitchen warfarin (COUMADIN) 2 MG tablet Take 2 mg by mouth daily.    . [DISCONTINUED] pantoprazole (PROTONIX) 40 MG tablet Take 1 tablet (40 mg total) by mouth daily before breakfast.     No current facility-administered medications on file prior to visit.     BP (!) 90/53 (BP Location: Right Arm, Cuff Size: Normal)   Pulse 62   Temp (!) 97.5 F (36.4 C) (Oral)   Resp 16   Ht 6\' 5"  (1.956 m)   Wt 170 lb (77.1 kg)   SpO2 96%   BMI 20.16 kg/m       Objective:   Physical Exam  Constitutional: He is oriented to person, place, and time. No distress.  Well-appearing elderly white male.  HENT:  Head: Normocephalic and atraumatic.  Eyes: EOM are normal.  Cardiovascular: Normal rate and regular rhythm.  No murmur heard. Pulmonary/Chest: Effort normal and breath sounds normal. No respiratory distress. He has no wheezes. He has no rales.  Musculoskeletal: He exhibits no edema.  Neurological: He is alert and oriented to person, place, and time.  Skin: Skin is warm and dry.  Psychiatric: He has a normal mood and affect. His behavior is normal. Thought content normal.          Assessment & Plan:  Weight loss-may be due to his chronic illness, however I am concerned about the possibility of an underlying malignancy.  We will begin with checking a complete metabolic panel, CBC, and TSH.  If this workup is unrevealing continues to have weight loss but may need further imaging including chest abdomen pelvis. In the meantime, I have advised the patient to add ensure  2 cans by mouth twice daily. The patient has an upcoming appointment with Dr. Charlett Blake on 11/20 and I have advised him to keep this appointment.   Altered mental status-this was  reported by staff.  He appears alert and oriented x3 today he is neurologically intact.  I suggested a CT of the head to rule out possibility of recent stroke.  He declines at this time.  Urinalysis is performed today and is unremarkable, however I will send it also for culture to rule out urinary tract infection.

## 2017-09-22 NOTE — Patient Instructions (Signed)
Please add ensure one can twice daily. Complete lab work prior to leaving. Follow up as scheduled with Dr. Charlett Blake, sooner if new/worsening symptoms.

## 2017-09-22 NOTE — Telephone Encounter (Signed)
cxr neg for pneumonia.  His kidney function has worsened mildly. Ensure adequate fluid intake and avoid nsaids.  Also,  He is mildly anemic. Continue multivitamin with minerals.

## 2017-09-23 ENCOUNTER — Ambulatory Visit (INDEPENDENT_AMBULATORY_CARE_PROVIDER_SITE_OTHER): Payer: Medicare Other | Admitting: *Deleted

## 2017-09-23 ENCOUNTER — Other Ambulatory Visit (INDEPENDENT_AMBULATORY_CARE_PROVIDER_SITE_OTHER): Payer: Medicare Other

## 2017-09-23 DIAGNOSIS — D509 Iron deficiency anemia, unspecified: Secondary | ICD-10-CM

## 2017-09-23 DIAGNOSIS — Z5181 Encounter for therapeutic drug level monitoring: Secondary | ICD-10-CM

## 2017-09-23 DIAGNOSIS — I4819 Other persistent atrial fibrillation: Secondary | ICD-10-CM

## 2017-09-23 DIAGNOSIS — I481 Persistent atrial fibrillation: Secondary | ICD-10-CM | POA: Diagnosis not present

## 2017-09-23 DIAGNOSIS — I4891 Unspecified atrial fibrillation: Secondary | ICD-10-CM

## 2017-09-23 LAB — POCT INR: INR: 2.5

## 2017-09-23 LAB — IRON: Iron: 27 ug/dL — ABNORMAL LOW (ref 42–165)

## 2017-09-23 MED ORDER — FERROUS SULFATE 325 (65 FE) MG PO TABS: 325.0000 mg | ORAL_TABLET | Freq: Two times a day (BID) | ORAL | 3 refills | Status: DC

## 2017-09-23 NOTE — Telephone Encounter (Signed)
Left message for pt to return my call.

## 2017-09-23 NOTE — Telephone Encounter (Signed)
Iron is low. Add iron 325mg  twice daily and plan to repeat serum iron, ferritin and cbc with diff in 3 months, dx iron def anemia.

## 2017-09-24 ENCOUNTER — Other Ambulatory Visit: Payer: Self-pay | Admitting: Family

## 2017-09-24 LAB — URINE CULTURE
MICRO NUMBER:: 81246532
SPECIMEN QUALITY:: ADEQUATE

## 2017-09-24 MED ORDER — AMOXICILLIN 500 MG PO CAPS: 500.0000 mg | ORAL_CAPSULE | Freq: Three times a day (TID) | ORAL | 0 refills | Status: DC

## 2017-09-24 NOTE — Telephone Encounter (Signed)
Called the patient informed of results/sent in antibitoic to [facility] [address] [employer]

## 2017-09-24 NOTE — Telephone Encounter (Signed)
Urine shows bacteria- could be contamination, but I would recommend rx with amoxicillin. Please send to pharmacy of his choice.

## 2017-09-24 NOTE — Telephone Encounter (Signed)
Called the patient on both cell/home number left a message to call back.

## 2017-09-25 NOTE — Telephone Encounter (Signed)
Notified pt of below results and he voices understanding. Denies NSAID usage. Pt not at home currently and will call us back to schedule lab appt when he has his calender. Future lab orders entered.

## 2017-09-29 ENCOUNTER — Ambulatory Visit (HOSPITAL_BASED_OUTPATIENT_CLINIC_OR_DEPARTMENT_OTHER)
Admission: RE | Admit: 2017-09-29 | Discharge: 2017-09-29 | Disposition: A | Discharge status: Home / Self Care | Payer: Medicare Other | Source: Ambulatory Visit | Attending: Family | Admitting: Family

## 2017-09-29 ENCOUNTER — Encounter: Payer: Self-pay | Admitting: Family

## 2017-09-29 ENCOUNTER — Telehealth: Payer: Self-pay | Admitting: Family

## 2017-09-29 ENCOUNTER — Ambulatory Visit: Payer: Medicare Other | Admitting: Family

## 2017-09-29 VITALS — BP 103/53 | HR 55 | Temp 97.9°F | Resp 18 | Ht 78.0 in | Wt 168.0 lb

## 2017-09-29 DIAGNOSIS — K5900 Constipation, unspecified: Secondary | ICD-10-CM

## 2017-09-29 DIAGNOSIS — D509 Iron deficiency anemia, unspecified: Secondary | ICD-10-CM

## 2017-09-29 DIAGNOSIS — Z23 Encounter for immunization: Secondary | ICD-10-CM | POA: Diagnosis not present

## 2017-09-29 NOTE — Telephone Encounter (Signed)
KUB confirms constipation. I recommend 1 capful of miralax today mixed in 8 oz of water. May repeat tomorrow if no good BM. Add colace 100mg  bid after he completes miralax.  Focus fresh fruits and veggies.

## 2017-09-29 NOTE — Patient Instructions (Addendum)
Please add a stool softener (colace 100mg ) twice daily.  Please Complete stool at home and return via mail. Complete abdominal x ray on the first floor.  We will contact you with your results.

## 2017-09-29 NOTE — Progress Notes (Signed)
Subjective:    Patient ID: [name] [name], male    DOB: 08-24-1931, 83 y.o.   MRN: [id number]  HPI  Patient is an 83 year old male who presents today for follow-up.  He was last seen on November 6.  At that time his caregivers were concerned about altered mental status.  He had had a recent fall.  He was also noted to have lost significant weight over the previous months.  We suggested that he add Ensure 1 can twice daily to help bring up his weight.  He was noted to have mild iron deficiency anemia with a hemoglobin of 12.2.  He was placed on iron supplement. Thyroid function was noted to be within normal limits.  His creatinine was noted to be mildly above his baseline of 1.3-1.5.  Creatinine was 1.73.  amoxicillin.  Today he presents with chief complaint of constipation.  He has had several small bowel movements with soft stool but not a significant "good" bowel movement in 1 week. Reports that he is taking senokot and MOM without significant improvment.  He reports that the constipation was present even prior to starting iron supplement.   Review of Systems See HPI  Past Medical History:  Diagnosis Date  . Abnormality of gait 05/27/2016  . Adenomatous polyps   . Carpal tunnel syndrome 06/25/2016   Right  . Depressive disorder, not elsewhere classified   . First degree AV block    Holter 3/18: NSR, PACs, PVCs, no AFib, no pauses.  Marland Kitchen Hereditary and idiopathic peripheral neuropathy 06/25/2016  . Hypertension   . Internal nasal lesion 05/15/2013  . Melanoma ([place])    Left Shoulder  . Mitral regurgitation   . MVP (mitral valve prolapse)    a. With severe MR s/p Complex valvuloplasty including artificial Gore-tex neochord placement x4, chordal transposition x1, chordal release x1, # 32 mm Sorin Memo 3D Ring Annuloplasty 2012. // b. Echo 2/18: mild LVH, EF 50-55, mild AI, MV repair with mild MR, mod LAE, mod RVE, severe RAE, severe TR  . Neuropathy   . Normal coronary arteries    a. Normal  coronary anatomy by cath 2012.  . Osteoarthritis    Knees  . PAF (paroxysmal atrial fibrillation) ([place])    a. Post-op MVR 2012.  Marland Kitchen Personal history of colonic polyps   . Prostate cancer ([place])   . Pulmonary HTN ([place])    a. Mild-mod by cath 2012.  . Pure hypercholesterolemia   . PVC (premature ventricular contraction)   . Thrombocytopenia ([place])   . Vision abnormalities    Cornea scarring     Social History   Socioeconomic History  . Marital status: Widowed    Spouse name: Not on file  . Number of children: 2  . Years of education: 77  . Highest education level: Not on file  Social Needs  . Financial resource strain: Not on file  . Food insecurity - worry: Not on file  . Food insecurity - inability: Not on file  . Transportation needs - medical: Not on file  . Transportation needs - non-medical: Not on file  Occupational History  . Not on file  Tobacco Use  . Smoking status: Never Smoker  . Smokeless tobacco: Never Used  Substance and Sexual Activity  . Alcohol use: No    Alcohol/week: 0.0 oz    Comment: Last drink in 2000  . Drug use: No  . Sexual activity: Not on file  Other Topics Concern  . Not  on file  Social History Narrative   Retired - Optometrist   Widower   2 children (one in [place] and on one in [place])    Drinks 1 cup of coffee per day    Past Surgical History:  Procedure Laterality Date  . CARDIAC CATHETERIZATION  09/2011   Pre-op for MVR -- normal coronaries.  . COLONOSCOPY W/ POLYPECTOMY    . INGUINAL HERNIA REPAIR  09/2009   Left  . KNEE ARTHROSCOPY      left x3  and right x2  . Melanoma Surgery     2001, 2005, 2006, 2009  . MITRAL VALVE REPAIR  10/01/2011   complex valvuloplasty with Goretex cord replacement and chordal transposition 27mm Sorin Memo 3D ring annuloplasty  . Nuclear Stress Test  09/2006   EF-64%, Normal  . PROSTATECTOMY  1993  . ROOT CANAL  08-19-12  . ROTATOR CUFF REPAIR  2003   left  . US ECHOCARDIOGRAPHY  09/2009,  08/1011   mild LVH,mild AI,MVP with mild MR, mild-mod. TR with mild Pulm. HTN, EF-55-60%    Family History  Problem Relation Age of Onset  . Clotting disorder Brother        CVA's  . Arthritis Mother   . Hypertension Mother   . Stroke Mother   . Hypertension Father   . Psychosis Father        psychiatric care  . Colon cancer Neg Hx   . Stomach cancer Neg Hx   . Heart attack Neg Hx     No Known Allergies  Current Outpatient Medications on File Prior to Visit  Medication Sig Dispense Refill  . amiodarone (PACERONE) 200 MG tablet Take 1 tablet (200 mg total) by mouth daily. 30 tablet 11  . amoxicillin (AMOXIL) 500 MG capsule Take 1 capsule (500 mg total) 3 (three) times daily by mouth. 30 capsule 0  . b complex vitamins tablet Take 1 tablet by mouth daily.    . cycloSPORINE (RESTASIS) 0.05 % ophthalmic emulsion Place 1 drop into both eyes 2 (two) times daily.    Marland Kitchen diltiazem (CARTIA XT) 300 MG 24 hr capsule Take 1 capsule (300 mg total) by mouth daily. 90 capsule 1  . ferrous sulfate 325 (65 FE) MG tablet Take 1 tablet (325 mg total) 2 (two) times daily with a meal by mouth.  3  . gabapentin (NEURONTIN) 300 MG capsule Take 1 capsule (300 mg total) by mouth 2 (two) times daily. 180 capsule 3  . glucosamine-chondroitin 500-400 MG tablet Take 1 tablet by mouth 2 (two) times daily.    . Multiple Vitamin (MULTIVITAMIN WITH MINERALS) TABS tablet Take 1 tablet by mouth every evening.     Marland Kitchen MYRBETRIQ 50 MG TB24 tablet Take 1 tablet by mouth daily.    . potassium chloride SA (K-DUR,KLOR-CON) 20 MEQ tablet Take 2 tablets (40 mEq total) by mouth daily. 180 tablet 3  . rosuvastatin (CRESTOR) 5 MG tablet Take 5 mg by mouth daily at 6 PM.     . TOPROL XL 50 MG 24 hr tablet Take 50 mg by mouth daily.     Marland Kitchen torsemide (DEMADEX) 20 MG tablet Take 2 tablets (40 mg total) by mouth daily. 180 tablet 3  . traZODone (DESYREL) 100 MG tablet Take 300 mg by mouth at bedtime.     . vitamin C (ASCORBIC ACID)  500 MG tablet Take 500 mg by mouth 2 (two) times daily.    Marland Kitchen warfarin (COUMADIN) 2 MG tablet  Take 2 mg by mouth daily.    . [DISCONTINUED] pantoprazole (PROTONIX) 40 MG tablet Take 1 tablet (40 mg total) by mouth daily before breakfast.     No current facility-administered medications on file prior to visit.     BP (!) 103/53 (BP Location: Right Arm, Patient Position: Sitting, Cuff Size: Small)   Pulse (!) 55   Temp 97.9 F (36.6 C) (Oral)   Resp 18   Ht 6\' 6"  (1.981 m)   Wt 168 lb (76.2 kg)   SpO2 95%   BMI 19.41 kg/m       Objective:   Physical Exam  Constitutional: He is oriented to person, place, and time. He appears well-developed and well-nourished. No distress.  HENT:  Head: Normocephalic and atraumatic.  Cardiovascular: Normal rate and regular rhythm.  No murmur heard. Pulmonary/Chest: Effort normal and breath sounds normal. No respiratory distress. He has no wheezes. He has no rales.  Abdominal: Soft. Bowel sounds are normal. He exhibits no distension. There is no tenderness. There is no rebound and no guarding.  Musculoskeletal: He exhibits no edema.  Neurological: He is alert and oriented to person, place, and time.  Skin: Skin is warm and dry.  Psychiatric: He has a normal mood and affect. His behavior is normal. Thought content normal.          Assessment & Plan:  Constipation-we will obtain an abdominal x-ray to further evaluate.  He is moving small amounts each day and the stool soft so I am not completely convinced that he is truly constipated.  Will see how his abdominal x-ray looks.  He can try adding Colace 100 mg twice daily in the meantime.  Iron deficiency anemia-continue iron supplement, will have him complete fecal occult blood cards and return via mail.  Pneumovax today.

## 2017-09-30 NOTE — Telephone Encounter (Signed)
Notified pt and he voices understanding. 

## 2017-10-04 ENCOUNTER — Other Ambulatory Visit: Payer: Self-pay | Admitting: Interventional Cardiology

## 2017-10-05 NOTE — Telephone Encounter (Signed)
This is a CHF pt 

## 2017-10-06 ENCOUNTER — Telehealth: Payer: Self-pay | Admitting: *Deleted

## 2017-10-06 ENCOUNTER — Ambulatory Visit: Payer: Medicare Other | Admitting: Family Medicine

## 2017-10-06 VITALS — BP 112/68 | HR 56 | Temp 97.4°F | Resp 18 | Wt 174.0 lb

## 2017-10-06 DIAGNOSIS — N289 Disorder of kidney and ureter, unspecified: Secondary | ICD-10-CM | POA: Diagnosis not present

## 2017-10-06 DIAGNOSIS — Z7901 Long term (current) use of anticoagulants: Secondary | ICD-10-CM | POA: Diagnosis not present

## 2017-10-06 DIAGNOSIS — I509 Heart failure, unspecified: Secondary | ICD-10-CM | POA: Diagnosis not present

## 2017-10-06 DIAGNOSIS — E7800 Pure hypercholesterolemia, unspecified: Secondary | ICD-10-CM

## 2017-10-06 DIAGNOSIS — N189 Chronic kidney disease, unspecified: Secondary | ICD-10-CM | POA: Diagnosis not present

## 2017-10-06 DIAGNOSIS — I10 Essential (primary) hypertension: Secondary | ICD-10-CM

## 2017-10-06 DIAGNOSIS — E785 Hyperlipidemia, unspecified: Secondary | ICD-10-CM | POA: Diagnosis not present

## 2017-10-06 DIAGNOSIS — D649 Anemia, unspecified: Secondary | ICD-10-CM

## 2017-10-06 DIAGNOSIS — C4330 Malignant melanoma of unspecified part of face: Secondary | ICD-10-CM

## 2017-10-06 DIAGNOSIS — K5900 Constipation, unspecified: Secondary | ICD-10-CM

## 2017-10-06 DIAGNOSIS — I481 Persistent atrial fibrillation: Secondary | ICD-10-CM | POA: Diagnosis not present

## 2017-10-06 DIAGNOSIS — I4819 Other persistent atrial fibrillation: Secondary | ICD-10-CM

## 2017-10-06 DIAGNOSIS — I5032 Chronic diastolic (congestive) heart failure: Secondary | ICD-10-CM | POA: Diagnosis not present

## 2017-10-06 NOTE — Assessment & Plan Note (Signed)
With severe valvular heart disease. Follows with cardiology and needs to minimize sodium and fluids in diet

## 2017-10-06 NOTE — Assessment & Plan Note (Signed)
Has had 9 melanomas and his most recent biopsy on his forehead revealed his 9th melanoma. Has an appt with the [facility] surgery center to have it removed.

## 2017-10-06 NOTE — Patient Instructions (Addendum)
1200 to 1500 ML or CC, 30   Encouraged increased hydration and fiber in diet. Daily probiotics. If bowels not moving can use MOM 2 tbls po in 4 oz of warm prune juice by mouth every 2-3 days. If no results then repeat in 4 hours with  Dulcolax suppository pr, may repeat again in 4 more hours as needed. Seek care if symptoms worsen. Consider daily Miralax and/or Dulcolax if symptoms persist.   Miralax with Benefiber together in a liquid once or twice daily to move bowels and stool softeners daily  Shingrix is the new shingles shot 2 shots over 2-6 months can get at pharmacy  Heart Failure Heart failure means your heart has trouble pumping blood. This makes it hard for your body to work well. Heart failure is usually a long-term (chronic) condition. You must take good care of yourself and follow your doctor's treatment plan. Follow these instructions at home:  Take your heart medicine as told by your doctor. ? Do not stop taking medicine unless your doctor tells you to. ? Do not skip any dose of medicine. ? Refill your medicines before they run out. ? Take other medicines only as told by your doctor or pharmacist.  Stay active if told by your doctor. The elderly and people with severe heart failure should talk with a doctor about physical activity.  Eat heart-healthy foods. Choose foods that are without trans fat and are low in saturated fat, cholesterol, and salt (sodium). This includes fresh or frozen fruits and vegetables, fish, lean meats, fat-free or low-fat dairy foods, whole grains, and high-fiber foods. Lentils and dried peas and beans (legumes) are also good choices.  Limit salt if told by your doctor.  Cook in a healthy way. Roast, grill, broil, bake, poach, steam, or stir-fry foods.  Limit fluids as told by your doctor.  Weigh yourself every morning. Do this after you pee (urinate) and before you eat breakfast. Write down your weight to give to your doctor.  Take your blood  pressure and write it down if your doctor tells you to.  Ask your doctor how to check your pulse. Check your pulse as told.  Lose weight if told by your doctor.  Stop smoking or chewing tobacco. Do not use gum or patches that help you quit without your doctor's approval.  Schedule and go to doctor visits as told.  Nonpregnant women should have no more than 1 drink a day. Men should have no more than 2 drinks a day. Talk to your doctor about drinking alcohol.  Stop illegal drug use.  Stay current with shots (immunizations).  Manage your health conditions as told by your doctor.  Learn to manage your stress.  Rest when you are tired.  If it is really hot outside: ? Avoid intense activities. ? Use air conditioning or fans, or get in a cooler place. ? Avoid caffeine and alcohol. ? Wear loose-fitting, lightweight, and light-colored clothing.  If it is really cold outside: ? Avoid intense activities. ? Layer your clothing. ? Wear mittens or gloves, a hat, and a scarf when going outside. ? Avoid alcohol.  Learn about heart failure and get support as needed.  Get help to maintain or improve your quality of life and your ability to care for yourself as needed. Contact a doctor if:  You gain weight quickly.  You are more short of breath than usual.  You cannot do your normal activities.  You tire easily.  You cough more  than normal, especially with activity.  You have any or more puffiness (swelling) in areas such as your hands, feet, ankles, or belly (abdomen).  You cannot sleep because it is hard to breathe.  You feel like your heart is beating fast (palpitations).  You get dizzy or light-headed when you stand up. Get help right away if:  You have trouble breathing.  There is a change in mental status, such as becoming less alert or not being able to focus.  You have chest pain or discomfort.  You faint. This information is not intended to replace advice given  to you by your health care provider. Make sure you discuss any questions you have with your health care provider. Document Released: 08/12/2008 Document Revised: 04/10/2016 Document Reviewed: 12/20/2012 Elsevier Interactive Patient Education  2017 [employer]  Constipation, Adult Constipation is when a person:  Poops (has a bowel movement) fewer times in a week than normal.  Has a hard time pooping.  Has poop that is dry, hard, or bigger than normal.  Follow these instructions at home: Eating and drinking   Eat foods that have a lot of fiber, such as: ? Fresh fruits and vegetables. ? Whole grains. ? Beans.  Eat less of foods that are high in fat, low in fiber, or overly processed, such as: ? [place] fries. ? Hamburgers. ? Cookies. ? Candy. ? Soda.  Drink enough fluid to keep your pee (urine) clear or pale yellow. General instructions  Exercise regularly or as told by your doctor.  Go to the restroom when you feel like you need to poop. Do not hold it in.  Take over-the-counter and prescription medicines only as told by your doctor. These include any fiber supplements.  Do pelvic floor retraining exercises, such as: ? Doing deep breathing while relaxing your lower belly (abdomen). ? Relaxing your pelvic floor while pooping.  Watch your condition for any changes.  Keep all follow-up visits as told by your doctor. This is important. Contact a doctor if:  You have pain that gets worse.  You have a fever.  You have not pooped for 4 days.  You throw up (vomit).  You are not hungry.  You lose weight.  You are bleeding from the anus.  You have thin, pencil-like poop (stool). Get help right away if:  You have a fever, and your symptoms suddenly get worse.  You leak poop or have blood in your poop.  Your belly feels hard or bigger than normal (is bloated).  You have very bad belly pain.  You feel dizzy or you faint. This information is not intended to  replace advice given to you by your health care provider. Make sure you discuss any questions you have with your health care provider. Document Released: 04/21/2008 Document Revised: 05/23/2016 Document Reviewed: 04/23/2016 Elsevier Interactive Patient Education  2017 [employer].

## 2017-10-06 NOTE — Assessment & Plan Note (Signed)
Tolerating coumadin follows with coumadin clinic

## 2017-10-06 NOTE — Assessment & Plan Note (Signed)
Will monitor and he will maintain adequate hydration

## 2017-10-06 NOTE — Assessment & Plan Note (Signed)
Tolerating statin, encouraged heart healthy diet, avoid trans fats, minimize simple carbs and saturated fats. Increase exercise as tolerated 

## 2017-10-06 NOTE — Assessment & Plan Note (Signed)
Encouraged increased hydration and fiber in diet. Daily probiotics. If bowels not moving can use MOM 2 tbls po in 4 oz of warm prune juice by mouth every 2-3 days. If no results then repeat in 4 hours with  Dulcolax suppository pr, may repeat again in 4 more hours as needed. Seek care if symptoms worsen. Consider daily Miralax and/or Dulcolax if symptoms persist. miralax and benefiber bid

## 2017-10-06 NOTE — Assessment & Plan Note (Signed)
Well controlled, no changes to meds. Encouraged heart healthy diet such as the DASH diet and exercise as tolerated.  °

## 2017-10-06 NOTE — Progress Notes (Signed)
Subjective:  I acted as a Education administrator for Dr. Charlett Blake. [place], [place]  Patient ID: [name] [name], male    DOB: 1931-07-09, 83 y.o.   MRN: [id number]  No chief complaint on file.   HPI  Patient is in today for a follow up. Patient c/o constipation since his health declined.  He has to take something every couple of days to get his bowels to move.  No bloody or tarry stool.  No abdominal pain.  He was admitted to the hospital this past year with acute onset heart failure, pedal edema and shortness of breath.  He was found to have severe valvular heart disease and right-sided heart failure.  After a long stent in rehab he is back home in his home has been modified.  He has home health aides every day of the week.  He continues to have some mild pedal edema but it is much improved.  No chest pain or palpitations.  He is noting some shortness of breath with exertion as well as generalized weakness with exertion.  No recent febrile illness. Denies CPHA/congestion/fevers or GU c/o. Taking meds as prescribed  Patient Care Team: [name], MD as PCP - General (Family Medicine) Darlin Coco, MD as Referring Physician (Cardiology) Willodean Rosenthal, MD as Consulting Physician (Internal Medicine) Dillingham, Loel Lofty, DO as Consulting Physician (Plastic Surgery) Magnus Sinning, MD as Consulting Physician   Past Medical History:  Diagnosis Date  . Abnormality of gait 05/27/2016  . Adenomatous polyps   . Carpal tunnel syndrome 06/25/2016   Right  . Depressive disorder, not elsewhere classified   . First degree AV block    Holter 3/18: NSR, PACs, PVCs, no AFib, no pauses.  Marland Kitchen Hereditary and idiopathic peripheral neuropathy 06/25/2016  . Hypertension   . Internal nasal lesion 05/15/2013  . Melanoma ([place])    Left Shoulder  . Mitral regurgitation   . MVP (mitral valve prolapse)    a. With severe MR s/p Complex valvuloplasty including artificial Gore-tex neochord placement x4, chordal transposition x1,  chordal release x1, # 32 mm Sorin Memo 3D Ring Annuloplasty 2012. // b. Echo 2/18: mild LVH, EF 50-55, mild AI, MV repair with mild MR, mod LAE, mod RVE, severe RAE, severe TR  . Neuropathy   . Normal coronary arteries    a. Normal coronary anatomy by cath 2012.  . Osteoarthritis    Knees  . PAF (paroxysmal atrial fibrillation) ([place])    a. Post-op MVR 2012.  Marland Kitchen Personal history of colonic polyps   . Prostate cancer ([place])   . Pulmonary HTN ([place])    a. Mild-mod by cath 2012.  . Pure hypercholesterolemia   . PVC (premature ventricular contraction)   . Thrombocytopenia ([place])   . Vision abnormalities    Cornea scarring    Past Surgical History:  Procedure Laterality Date  . CARDIAC CATHETERIZATION  09/2011   Pre-op for MVR -- normal coronaries.  Marland Kitchen CARDIOVERSION N/A 01/02/2016   Procedure: CARDIOVERSION;  Surgeon: Thayer Headings, MD;  Location: [facility] ENDOSCOPY;  Service: Cardiovascular;  Laterality: N/A;  . COLONOSCOPY W/ POLYPECTOMY    . INGUINAL HERNIA REPAIR  09/2009   Left  . KNEE ARTHROSCOPY      left x3  and right x2  . Melanoma Surgery     2001, 2005, 2006, 2009  . MITRAL VALVE REPAIR  10/01/2011   complex valvuloplasty with Goretex cord replacement and chordal transposition 87mm Sorin Memo 3D ring annuloplasty  . Nuclear  Stress Test  09/2006   EF-64%, Normal  . PROSTATECTOMY  1993  . RIGHT HEART CATH N/A 04/29/2017   Procedure: Right Heart Cath;  Surgeon: Jolaine Artist, MD;  Location: [place] CV LAB;  Service: Cardiovascular;  Laterality: N/A;  . ROOT CANAL  08-19-12  . ROTATOR CUFF REPAIR  2003   left  . TEE WITHOUT CARDIOVERSION  09/26/2011   Procedure: TRANSESOPHAGEAL ECHOCARDIOGRAM (TEE);  Surgeon: Lelon Perla, MD;  Location: [facility] ENDOSCOPY;  Service: Cardiovascular;  Laterality: N/A;  . US ECHOCARDIOGRAPHY  09/2009, 08/1011   mild LVH,mild AI,MVP with mild MR, mild-mod. TR with mild Pulm. HTN, EF-55-60%    Family History  Problem Relation Age of Onset    . Clotting disorder Brother        CVA's  . Arthritis Mother   . Hypertension Mother   . Stroke Mother   . Hypertension Father   . Psychosis Father        psychiatric care  . Colon cancer Neg Hx   . Stomach cancer Neg Hx   . Heart attack Neg Hx     Social History   Socioeconomic History  . Marital status: Widowed    Spouse name: Not on file  . Number of children: 2  . Years of education: 79  . Highest education level: Not on file  Social Needs  . Financial resource strain: Not on file  . Food insecurity - worry: Not on file  . Food insecurity - inability: Not on file  . Transportation needs - medical: Not on file  . Transportation needs - non-medical: Not on file  Occupational History  . Not on file  Tobacco Use  . Smoking status: Never Smoker  . Smokeless tobacco: Never Used  Substance and Sexual Activity  . Alcohol use: No    Alcohol/week: 0.0 oz    Comment: Last drink in 2000  . Drug use: No  . Sexual activity: Not on file  Other Topics Concern  . Not on file  Social History Narrative   Retired - Optometrist   Widower   2 children (one in [place] and on one in [place])    Drinks 1 cup of coffee per day    Outpatient Medications Prior to Visit  Medication Sig Dispense Refill  . amiodarone (PACERONE) 200 MG tablet Take 1 tablet (200 mg total) by mouth daily. 30 tablet 11  . b complex vitamins tablet Take 1 tablet by mouth daily.    . cycloSPORINE (RESTASIS) 0.05 % ophthalmic emulsion Place 1 drop into both eyes 2 (two) times daily.    Marland Kitchen diltiazem (CARTIA XT) 300 MG 24 hr capsule Take 1 capsule (300 mg total) by mouth daily. 90 capsule 1  . ferrous sulfate 325 (65 FE) MG tablet Take 1 tablet (325 mg total) 2 (two) times daily with a meal by mouth.  3  . gabapentin (NEURONTIN) 300 MG capsule Take 1 capsule (300 mg total) by mouth 2 (two) times daily. 180 capsule 3  . glucosamine-chondroitin 500-400 MG tablet Take 1 tablet by mouth 2 (two) times daily.    .  Multiple Vitamin (MULTIVITAMIN WITH MINERALS) TABS tablet Take 1 tablet by mouth every evening.     Marland Kitchen MYRBETRIQ 50 MG TB24 tablet Take 1 tablet by mouth daily.    . potassium chloride SA (K-DUR,KLOR-CON) 20 MEQ tablet Take 2 tablets (40 mEq total) by mouth daily. 180 tablet 3  . rosuvastatin (CRESTOR) 5 MG tablet Take  5 mg by mouth daily at 6 PM.     . TOPROL XL 50 MG 24 hr tablet Take 50 mg by mouth daily.     Marland Kitchen torsemide (DEMADEX) 20 MG tablet Take 2 tablets (40 mg total) by mouth daily. 180 tablet 3  . traZODone (DESYREL) 100 MG tablet Take 300 mg by mouth at bedtime.     . vitamin C (ASCORBIC ACID) 500 MG tablet Take 500 mg by mouth 2 (two) times daily.    Marland Kitchen warfarin (COUMADIN) 2 MG tablet Take 2 mg by mouth daily.    Marland Kitchen amoxicillin (AMOXIL) 500 MG capsule Take 1 capsule (500 mg total) 3 (three) times daily by mouth. 30 capsule 0   No facility-administered medications prior to visit.     No Known Allergies  Review of Systems  Constitutional: Negative for fever and malaise/fatigue.  HENT: Negative for congestion.   Eyes: Negative for blurred vision.  Respiratory: Positive for shortness of breath. Negative for cough.   Cardiovascular: Positive for leg swelling. Negative for chest pain and palpitations.  Gastrointestinal: Negative for vomiting.  Musculoskeletal: Negative for back pain.  Skin: Negative for rash.  Neurological: Positive for weakness. Negative for loss of consciousness and headaches.       Objective:    Physical Exam  Constitutional: He is oriented to person, place, and time. He appears well-developed and well-nourished. No distress.  HENT:  Head: Normocephalic and atraumatic.  Eyes: Conjunctivae are normal.  Neck: Normal range of motion. No thyromegaly present.  Cardiovascular: Normal rate and regular rhythm.  Pulmonary/Chest: Effort normal and breath sounds normal. He has no wheezes.  Abdominal: Soft. Bowel sounds are normal. There is no tenderness.    Musculoskeletal: Normal range of motion. He exhibits no edema or deformity.  Neurological: He is alert and oriented to person, place, and time.  Skin: Skin is warm and dry. He is not diaphoretic.  Psychiatric: He has a normal mood and affect.    BP 112/68 (BP Location: Left Arm, Patient Position: Sitting, Cuff Size: Normal)   Pulse (!) 56   Temp (!) 97.4 F (36.3 C) (Oral)   Resp 18   Wt 174 lb (78.9 kg)   SpO2 93%   BMI 20.11 kg/m  Wt Readings from Last 3 Encounters:  10/06/17 174 lb (78.9 kg)  09/29/17 168 lb (76.2 kg)  09/22/17 170 lb (77.1 kg)   BP Readings from Last 3 Encounters:  10/06/17 112/68  09/29/17 (!) 103/53  09/22/17 (!) 90/53     Immunization History  Administered Date(s) Administered  . Influenza Split 08/04/2012  . Influenza Whole 08/15/2008  . Influenza,inj,Quad PF,6+ Mos 08/18/2016  . Influenza-Unspecified 08/17/2013, 10/10/2014, 08/18/2015, 08/17/2017  . Pneumococcal Conjugate-13 10/10/2014  . Pneumococcal Polysaccharide-23 09/29/2017  . Zoster 08/17/2013    Health Maintenance  Topic Date Due  . Samul Dada  03/30/1950  . COLONOSCOPY  08/25/2017  . INFLUENZA VACCINE  Completed  . PNA vac Low Risk Adult  Completed    Lab Results  Component Value Date   WBC 6.2 09/22/2017   HGB 12.2 (L) 09/22/2017   HCT 37.9 (L) 09/22/2017   PLT 70.0 (L) 09/22/2017   GLUCOSE 90 09/22/2017   CHOL 112 (L) 11/02/2015   TRIG 66 11/02/2015   HDL 49 11/02/2015   LDLCALC 50 11/02/2015   ALT 16 09/22/2017   AST 16 09/22/2017   NA 140 09/22/2017   K 3.9 09/22/2017   CL 102 09/22/2017   CREATININE 1.73 (H) 09/22/2017  BUN 36 (H) 09/22/2017   CO2 32 09/22/2017   TSH 1.76 09/22/2017   PSA 0.07 (L) 09/17/2010   INR 2.5 09/23/2017   HGBA1C 6.0 (H) 04/03/2014    Lab Results  Component Value Date   TSH 1.76 09/22/2017   Lab Results  Component Value Date   WBC 6.2 09/22/2017   HGB 12.2 (L) 09/22/2017   HCT 37.9 (L) 09/22/2017   MCV 84.3 09/22/2017    PLT 70.0 (L) 09/22/2017   Lab Results  Component Value Date   NA 140 09/22/2017   K 3.9 09/22/2017   CO2 32 09/22/2017   GLUCOSE 90 09/22/2017   BUN 36 (H) 09/22/2017   CREATININE 1.73 (H) 09/22/2017   BILITOT 0.8 09/22/2017   ALKPHOS 49 09/22/2017   AST 16 09/22/2017   ALT 16 09/22/2017   PROT 7.2 09/22/2017   ALBUMIN 3.8 09/22/2017   CALCIUM 9.8 09/22/2017   ANIONGAP 8 09/04/2017   GFR 40.15 (L) 09/22/2017   Lab Results  Component Value Date   CHOL 112 (L) 11/02/2015   Lab Results  Component Value Date   HDL 49 11/02/2015   Lab Results  Component Value Date   LDLCALC 50 11/02/2015   Lab Results  Component Value Date   TRIG 66 11/02/2015   Lab Results  Component Value Date   CHOLHDL 2.3 11/02/2015   Lab Results  Component Value Date   HGBA1C 6.0 (H) 04/03/2014         Assessment & Plan:   Problem List Items Addressed This Visit    Atrial fibrillation (HCC) (Chronic)    On coumadin, rate controlled      Chronic diastolic heart failure (HCC) (Chronic)    With severe valvular heart disease. Follows with cardiology and needs to minimize sodium and fluids in diet      HYPERCHOLESTEROLEMIA    Tolerating statin, encouraged heart healthy diet, avoid trans fats, minimize simple carbs and saturated fats. Increase exercise as tolerated      HYPERTENSION, BENIGN ESSENTIAL    Well controlled, no changes to meds. Encouraged heart healthy diet such as the DASH diet and exercise as tolerated.       Long term (current) use of anticoagulants    Tolerating coumadin follows with coumadin clinic      Melanoma [facility])    Has had 9 melanomas and his most recent biopsy on his forehead revealed his 9th melanoma. Has an appt with the skin surgery center to have it removed.       Constipation    Encouraged increased hydration and fiber in diet. Daily probiotics. If bowels not moving can use MOM 2 tbls po in 4 oz of warm prune juice by mouth every 2-3 days. If no  results then repeat in 4 hours with  Dulcolax suppository pr, may repeat again in 4 more hours as needed. Seek care if symptoms worsen. Consider daily Miralax and/or Dulcolax if symptoms persist. miralax and benefiber bid      Chronic renal insufficiency    Will monitor and he will maintain adequate hydration       Other Visit Diagnoses    Anemia, unspecified type    -  Primary   Relevant Orders   CBC with Differential/Platelet   Renal insufficiency       Relevant Orders   Comprehensive metabolic panel   Congestive heart failure, unspecified HF chronicity, unspecified heart failure type ([place])       Relevant Orders   TSH  Hyperlipidemia, unspecified hyperlipidemia type       Relevant Orders   Lipid panel      I have discontinued Meta Hatchet. Fouts's amoxicillin. I am also having him maintain his traZODone, vitamin C, TOPROL XL, rosuvastatin, MYRBETRIQ, multivitamin with minerals, glucosamine-chondroitin, b complex vitamins, cycloSPORINE, warfarin, potassium chloride SA, amiodarone, diltiazem, torsemide, gabapentin, and ferrous sulfate.  No orders of the defined types were placed in this encounter.   CMA served as Education administrator during this visit. History, Physical and Plan performed by medical provider. Documentation and orders reviewed and attested to.  Penni Homans, MD

## 2017-10-06 NOTE — Telephone Encounter (Signed)
Received Physician Orders/Plan of Care from [facility]; forwarded to provider/SLS 11/20

## 2017-10-06 NOTE — Assessment & Plan Note (Signed)
On coumadin, rate controlled

## 2017-10-07 ENCOUNTER — Encounter: Payer: Self-pay | Admitting: Internal Medicine

## 2017-10-07 ENCOUNTER — Ambulatory Visit: Payer: Medicare Other | Admitting: Internal Medicine

## 2017-10-07 VITALS — BP 110/67 | HR 60 | Temp 98.3°F | Resp 14 | Ht 78.0 in | Wt 166.4 lb

## 2017-10-07 DIAGNOSIS — I5032 Chronic diastolic (congestive) heart failure: Secondary | ICD-10-CM | POA: Diagnosis not present

## 2017-10-07 DIAGNOSIS — R197 Diarrhea, unspecified: Secondary | ICD-10-CM | POA: Diagnosis not present

## 2017-10-07 DIAGNOSIS — L89301 Pressure ulcer of unspecified buttock, stage 1: Secondary | ICD-10-CM | POA: Diagnosis not present

## 2017-10-07 MED ORDER — KETOCONAZOLE 2 % EX CREA: 1.0000 "application " | TOPICAL_CREAM | Freq: Every day | CUTANEOUS | 0 refills | Status: DC

## 2017-10-07 NOTE — Progress Notes (Signed)
Subjective:    Patient ID: [name] [name], male    DOB: 1931/10/28, 83 y.o.   MRN: [id number]  DOS:  10/07/2017 Type of visit - description : Acute visit Interval history: Was seen 09/29/2017 with constipation, recommended daily MiraLAX and Colace which he has been doing. Patient was seen yesterday by PCP, was still constipated, abdominal exam benign, VSS Last night, started w/  nausea, vomited clear liquids, had diffuse abdominal cramping follow-up by a "blowout": Had a very large amount of watery brown diarrhea without blood. This morning he feels slightly better. Stomach is almost back to normal, he has been able to tolerate fluids and applesauce without problems.  Also has a pressure ulcer and advice. Has not taken any medication in the last 18 hours  Wt Readings from Last 3 Encounters:  10/07/17 166 lb 6 oz (75.5 kg)  10/06/17 174 lb (78.9 kg)  09/29/17 168 lb (76.2 kg)    Review of Systems Denies fever chills Appetite is still poor.   Past Medical History:  Diagnosis Date  . Abnormality of gait 05/27/2016  . Adenomatous polyps   . Carpal tunnel syndrome 06/25/2016   Right  . Depressive disorder, not elsewhere classified   . First degree AV block    Holter 3/18: NSR, PACs, PVCs, no AFib, no pauses.  Marland Kitchen Hereditary and idiopathic peripheral neuropathy 06/25/2016  . Hypertension   . Internal nasal lesion 05/15/2013  . Melanoma ([place])    Left Shoulder  . Mitral regurgitation   . MVP (mitral valve prolapse)    a. With severe MR s/p Complex valvuloplasty including artificial Gore-tex neochord placement x4, chordal transposition x1, chordal release x1, # 32 mm Sorin Memo 3D Ring Annuloplasty 2012. // b. Echo 2/18: mild LVH, EF 50-55, mild AI, MV repair with mild MR, mod LAE, mod RVE, severe RAE, severe TR  . Neuropathy   . Normal coronary arteries    a. Normal coronary anatomy by cath 2012.  . Osteoarthritis    Knees  . PAF (paroxysmal atrial fibrillation) ([place])    a.  Post-op MVR 2012.  Marland Kitchen Personal history of colonic polyps   . Prostate cancer ([place])   . Pulmonary HTN ([place])    a. Mild-mod by cath 2012.  . Pure hypercholesterolemia   . PVC (premature ventricular contraction)   . Thrombocytopenia ([place])   . Vision abnormalities    Cornea scarring    Past Surgical History:  Procedure Laterality Date  . CARDIAC CATHETERIZATION  09/2011   Pre-op for MVR -- normal coronaries.  Marland Kitchen CARDIOVERSION N/A 01/02/2016   Procedure: CARDIOVERSION;  Surgeon: Thayer Headings, MD;  Location: [facility] ENDOSCOPY;  Service: Cardiovascular;  Laterality: N/A;  . COLONOSCOPY W/ POLYPECTOMY    . INGUINAL HERNIA REPAIR  09/2009   Left  . KNEE ARTHROSCOPY      left x3  and right x2  . Melanoma Surgery     2001, 2005, 2006, 2009  . MITRAL VALVE REPAIR  10/01/2011   complex valvuloplasty with Goretex cord replacement and chordal transposition 48mm Sorin Memo 3D ring annuloplasty  . Nuclear Stress Test  09/2006   EF-64%, Normal  . PROSTATECTOMY  1993  . RIGHT HEART CATH N/A 04/29/2017   Procedure: Right Heart Cath;  Surgeon: Jolaine Artist, MD;  Location: [place] CV LAB;  Service: Cardiovascular;  Laterality: N/A;  . ROOT CANAL  08-19-12  . ROTATOR CUFF REPAIR  2003   left  . TEE WITHOUT CARDIOVERSION  09/26/2011  Procedure: TRANSESOPHAGEAL ECHOCARDIOGRAM (TEE);  Surgeon: Lelon Perla, MD;  Location: [facility] ENDOSCOPY;  Service: Cardiovascular;  Laterality: N/A;  . US ECHOCARDIOGRAPHY  09/2009, 08/1011   mild LVH,mild AI,MVP with mild MR, mild-mod. TR with mild Pulm. HTN, EF-55-60%    Social History   Socioeconomic History  . Marital status: Widowed    Spouse name: Not on file  . Number of children: 2  . Years of education: 36  . Highest education level: Not on file  Social Needs  . Financial resource strain: Not on file  . Food insecurity - worry: Not on file  . Food insecurity - inability: Not on file  . Transportation needs - medical: Not on file  .  Transportation needs - non-medical: Not on file  Occupational History  . Not on file  Tobacco Use  . Smoking status: Never Smoker  . Smokeless tobacco: Never Used  Substance and Sexual Activity  . Alcohol use: No    Alcohol/week: 0.0 oz    Comment: Last drink in 2000  . Drug use: No  . Sexual activity: Not on file  Other Topics Concern  . Not on file  Social History Narrative   Retired - Optometrist   Widower   2 children (one in [place] and on one in [place])    Drinks 1 cup of coffee per day      Allergies as of 10/07/2017   No Known Allergies     Medication List        Accurate as of 10/07/17  2:05 PM. Always use your most recent med list.          amiodarone 200 MG tablet Commonly known as:  PACERONE Take 1 tablet (200 mg total) by mouth daily.   b complex vitamins tablet Take 1 tablet by mouth daily.   cycloSPORINE 0.05 % ophthalmic emulsion Commonly known as:  RESTASIS Place 1 drop into both eyes 2 (two) times daily.   diltiazem 300 MG 24 hr capsule Commonly known as:  CARTIA XT Take 1 capsule (300 mg total) by mouth daily.   ferrous sulfate 325 (65 FE) MG tablet Take 1 tablet (325 mg total) 2 (two) times daily with a meal by mouth.   gabapentin 300 MG capsule Commonly known as:  NEURONTIN Take 1 capsule (300 mg total) by mouth 2 (two) times daily.   glucosamine-chondroitin 500-400 MG tablet Take 1 tablet by mouth 2 (two) times daily.   multivitamin with minerals Tabs tablet Take 1 tablet by mouth every evening.   MYRBETRIQ 50 MG Tb24 tablet Generic drug:  mirabegron ER Take 1 tablet by mouth daily.   potassium chloride SA 20 MEQ tablet Commonly known as:  K-DUR,KLOR-CON Take 2 tablets (40 mEq total) by mouth daily.   rosuvastatin 5 MG tablet Commonly known as:  CRESTOR Take 5 mg by mouth daily at 6 PM.   TOPROL XL 50 MG 24 hr tablet Generic drug:  metoprolol succinate Take 50 mg by mouth daily.   torsemide 20 MG tablet Commonly  known as:  DEMADEX Take 2 tablets (40 mg total) by mouth daily.   traZODone 100 MG tablet Commonly known as:  DESYREL Take 300 mg by mouth at bedtime.   vitamin C 500 MG tablet Commonly known as:  ASCORBIC ACID Take 500 mg by mouth 2 (two) times daily.   warfarin 2 MG tablet Commonly known as:  COUMADIN Take as directed by the anticoagulation clinic. If you are unsure how  to take this medication, talk to your nurse or doctor. Original instructions:  Take 2 mg by mouth daily.          Objective:   Physical Exam BP 110/67 (BP Location: Left Arm, Patient Position: Sitting, Cuff Size: Small)   Pulse 60   Temp 98.3 F (36.8 C) (Oral)   Resp 14   Ht 6\' 6"  (1.981 m)   Wt 166 lb 6 oz (75.5 kg)   SpO2 94%   BMI 19.23 kg/m  General:   Well developed, slightly underweight appearing, looks chronically ill but nontoxic. NAD.  HEENT:  Normocephalic . Face symmetric, atraumatic Lungs:  CTA B Normal respiratory effort, no intercostal retractions, no accessory muscle use. Extremities:.  no pretibial edema bilaterally  Abdomen:  Not distended, soft, non-tender. No rebound or rigidity.  Slightly increased bowel sounds Skin:  + Redness buttocks, scrotum consistent with a stage I pressure ulcer. He has a circular rash on the inner aspect of the proximal left thigh, likely fungal infection Neurologic:  alert & oriented X3.  Speech normal, gait not tested, need help transferring Psych--  Cognition and judgment appear intact.  Cooperative with normal attention span and concentration.  Behavior appropriate. No anxious or depressed appearing.      Assessment & Plan:    83 year old gentleman with a history of CHF, cholesterol, HTN, renal failure, prostate cancer, atrial fibrillation anticoagulated, MVP S/P bioprosthetic MVR, chronic back pain, presure ulcer sacral region, melanoma,presents w/   Nausea, vomiting, diarrhea: In the context of recent constipation and daily use of  MiraLAX and Colace; symptoms were likely induced by laxatives.  The patient's daughter is concerned about infection, that is also possible but he is now feeling better and had no fever. rec good hydration will return to a regular diet.  See AVS. Since the constipation is chronic will recommend MiraLAX and Colace twice a week.  Recommend to contact PCP to further help is needed.  Heart failure, atrial fibrillation: ha not taken any medication in the last 18 hours, recommend to go back on medicines gradually, see instructions.  No diuretics or KCl unless he is drinking plenty of fluids.  Pressure ulcer stage I: Recommend frequent turning, sheep skin, Desitin.  Seems to have also fungal infection, trial with Nizoral.   Today, I spent more than 30    min with the patient: >50% of the time counseling regards how to go back on routine medications, treatment and prevention of pressure ulcers, multiple questions answered to the best of my ability.

## 2017-10-07 NOTE — Progress Notes (Signed)
Pre visit review using our clinic review tool, if applicable. No additional management support is needed unless otherwise documented below in the visit note. 

## 2017-10-07 NOTE — Patient Instructions (Addendum)
Follow bland diet for a few days (rice, chicken, Angel hair), drink lots of fluids but with little salt.  Gradually go back to a more regular diet  For chronic constipation: Start MiraLAX twice a week next week Colace also twice a day week in different days Okay to use a glycerin suppository occasionally. Call if symptoms severe  For the pressure ulcers: Frequent rotation, sheep skin Desitin as needed Also Nizoral cream to the inner legs daily for 10 days Watch for big openings, discharge or bleeding.   Okay to go back on amiodarone, trazodone and coumadin today Ok to go back on all med tomorrow  But avoid torsemide and potassium until you are sure you are taking your normal amount of fluids

## 2017-10-14 ENCOUNTER — Ambulatory Visit (INDEPENDENT_AMBULATORY_CARE_PROVIDER_SITE_OTHER): Payer: Medicare Other | Admitting: *Deleted

## 2017-10-14 DIAGNOSIS — I4891 Unspecified atrial fibrillation: Secondary | ICD-10-CM | POA: Diagnosis not present

## 2017-10-14 DIAGNOSIS — Z9889 Other specified postprocedural states: Secondary | ICD-10-CM

## 2017-10-14 DIAGNOSIS — Z5181 Encounter for therapeutic drug level monitoring: Secondary | ICD-10-CM | POA: Diagnosis not present

## 2017-10-14 DIAGNOSIS — I4892 Unspecified atrial flutter: Secondary | ICD-10-CM

## 2017-10-14 LAB — POCT INR: INR: 3.2

## 2017-10-14 NOTE — Patient Instructions (Signed)
Today Nov 28th do not take coumadin then continue taking same dosage 1/2 tablet daily. Recheck in 3weeks. Call [phone] if scheduled for any procedures or on any new medications

## 2017-10-21 ENCOUNTER — Encounter: Payer: Self-pay | Admitting: Internal Medicine

## 2017-10-29 ENCOUNTER — Other Ambulatory Visit: Payer: Self-pay | Admitting: Interventional Cardiology

## 2017-11-05 ENCOUNTER — Ambulatory Visit (INDEPENDENT_AMBULATORY_CARE_PROVIDER_SITE_OTHER): Payer: Medicare Other | Admitting: *Deleted

## 2017-11-05 DIAGNOSIS — I4819 Other persistent atrial fibrillation: Secondary | ICD-10-CM

## 2017-11-05 DIAGNOSIS — I481 Persistent atrial fibrillation: Secondary | ICD-10-CM | POA: Diagnosis not present

## 2017-11-05 DIAGNOSIS — I4891 Unspecified atrial fibrillation: Secondary | ICD-10-CM

## 2017-11-05 DIAGNOSIS — Z5181 Encounter for therapeutic drug level monitoring: Secondary | ICD-10-CM | POA: Diagnosis not present

## 2017-11-05 DIAGNOSIS — Z9889 Other specified postprocedural states: Secondary | ICD-10-CM | POA: Diagnosis not present

## 2017-11-05 DIAGNOSIS — I4892 Unspecified atrial flutter: Secondary | ICD-10-CM

## 2017-11-05 LAB — POCT INR: INR: 2.6

## 2017-11-05 NOTE — Patient Instructions (Signed)
Description   Continue taking same dosage 1/2 tablet daily. Recheck in 4 weeks. Call [phone] if scheduled for any procedures or on any new medications

## 2017-11-12 ENCOUNTER — Ambulatory Visit: Payer: Medicare Other | Admitting: Family Medicine

## 2017-11-12 ENCOUNTER — Encounter: Payer: Self-pay | Admitting: Family Medicine

## 2017-11-12 VITALS — BP 100/62 | HR 67 | Temp 98.2°F | Ht 77.0 in | Wt 164.0 lb

## 2017-11-12 DIAGNOSIS — L03116 Cellulitis of left lower limb: Secondary | ICD-10-CM | POA: Diagnosis not present

## 2017-11-12 MED ORDER — DOXYCYCLINE HYCLATE 100 MG PO TABS: 100.0000 mg | ORAL_TABLET | Freq: Two times a day (BID) | ORAL | 0 refills | Status: DC

## 2017-11-12 NOTE — Progress Notes (Signed)
Pre visit review using our clinic review tool, if applicable. No additional management support is needed unless otherwise documented below in the visit note. 

## 2017-11-12 NOTE — Patient Instructions (Addendum)
Things to look out for: increasing pain not relieved by ibuprofen/acetaminophen, fevers, spreading redness, drainage of pus, or foul odor.  Bring your medicine list to your dermatology appointment so they can see.   Elevate your legs. Continue with compression stockings.  Let us know if you need anything.

## 2017-11-12 NOTE — Progress Notes (Signed)
Chief Complaint  Patient presents with  . Foot Swelling    [name] [name] is a 83 y.o. male here for a skin complaint.  Duration: 2 days Location: L foot Pruritic? No Painful? Yes Drainage? No New soaps/lotions/topicals/detergents? No Sick contacts? No Other associated symptoms: swelling Therapies tried thus far: none  ROS:  Const: No fevers Skin: As noted in HPI  Past Medical History:  Diagnosis Date  . Abnormality of gait 05/27/2016  . Adenomatous polyps   . Carpal tunnel syndrome 06/25/2016   Right  . Depressive disorder, not elsewhere classified   . First degree AV block    Holter 3/18: NSR, PACs, PVCs, no AFib, no pauses.  Marland Kitchen Hereditary and idiopathic peripheral neuropathy 06/25/2016  . Hypertension   . Internal nasal lesion 05/15/2013  . Melanoma ([place])    Left Shoulder  . Mitral regurgitation   . MVP (mitral valve prolapse)    a. With severe MR s/p Complex valvuloplasty including artificial Gore-tex neochord placement x4, chordal transposition x1, chordal release x1, # 32 mm Sorin Memo 3D Ring Annuloplasty 2012. // b. Echo 2/18: mild LVH, EF 50-55, mild AI, MV repair with mild MR, mod LAE, mod RVE, severe RAE, severe TR  . Neuropathy   . Normal coronary arteries    a. Normal coronary anatomy by cath 2012.  . Osteoarthritis    Knees  . PAF (paroxysmal atrial fibrillation) ([place])    a. Post-op MVR 2012.  Marland Kitchen Personal history of colonic polyps   . Prostate cancer ([place])   . Pulmonary HTN ([place])    a. Mild-mod by cath 2012.  . Pure hypercholesterolemia   . PVC (premature ventricular contraction)   . Thrombocytopenia ([place])   . Vision abnormalities    Cornea scarring   No Known Allergies Allergies as of 11/12/2017   No Known Allergies     Medication List        Accurate as of 11/12/17  3:42 PM. Always use your most recent med list.          amiodarone 200 MG tablet Commonly known as:  PACERONE Take 1 tablet (200 mg total) by mouth daily.   b complex  vitamins tablet Take 1 tablet by mouth daily.   cycloSPORINE 0.05 % ophthalmic emulsion Commonly known as:  RESTASIS Place 1 drop into both eyes 2 (two) times daily.   diltiazem 300 MG 24 hr capsule Commonly known as:  CARTIA XT Take 1 capsule (300 mg total) by mouth daily.   doxycycline 100 MG tablet Commonly known as:  VIBRA-TABS Take 1 tablet (100 mg total) by mouth 2 (two) times daily.   ferrous sulfate 325 (65 FE) MG tablet Take 1 tablet (325 mg total) 2 (two) times daily with a meal by mouth.   gabapentin 300 MG capsule Commonly known as:  NEURONTIN Take 1 capsule (300 mg total) by mouth 2 (two) times daily.   glucosamine-chondroitin 500-400 MG tablet Take 1 tablet by mouth 2 (two) times daily.   ketoconazole 2 % cream Commonly known as:  NIZORAL Apply 1 application topically daily.   multivitamin with minerals Tabs tablet Take 1 tablet by mouth every evening.   MYRBETRIQ 50 MG Tb24 tablet Generic drug:  mirabegron ER Take 1 tablet by mouth daily.   potassium chloride SA 20 MEQ tablet Commonly known as:  K-DUR,KLOR-CON Take 2 tablets (40 mEq total) by mouth daily.   rosuvastatin 5 MG tablet Commonly known as:  CRESTOR Take 5 mg by mouth  daily at 6 PM.   rosuvastatin 5 MG tablet Commonly known as:  CRESTOR TAKE 1 TABLET DAILY   TOPROL XL 50 MG 24 hr tablet Generic drug:  metoprolol succinate Take 50 mg by mouth daily.   metoprolol succinate 50 MG 24 hr tablet Commonly known as:  TOPROL-XL TAKE 1 TABLET DAILY, TAKE WITH OR IMMEDIATELY FOLLOWING A MEAL   torsemide 20 MG tablet Commonly known as:  DEMADEX Take 2 tablets (40 mg total) by mouth daily.   traZODone 100 MG tablet Commonly known as:  DESYREL Take 300 mg by mouth at bedtime.   vitamin C 500 MG tablet Commonly known as:  ASCORBIC ACID Take 500 mg by mouth 2 (two) times daily.   warfarin 5 MG tablet Commonly known as:  COUMADIN Take as directed by the anticoagulation clinic. If you are  unsure how to take this medication, talk to your nurse or doctor. Original instructions:  TAKE 1 TABLET DAILY AS DIRECTED BY THE COUMADIN CLINIC       BP 100/62 (BP Location: Left Arm, Patient Position: Sitting, Cuff Size: Normal)   Pulse 67   Temp 98.2 F (36.8 C) (Oral)   Ht 6\' 5"  (1.956 m)   Wt 164 lb (74.4 kg)   SpO2 95%   BMI 19.45 kg/m  Gen: awake, alert, appearing stated age Lungs: No accessory muscle use Skin: See below. +warmth and TTP. No drainage, fluctuance, excoriation Psych: Age appropriate judgment and insight  Media Information     Cellulitis of left lower extremity  Orders as above. Warning s/s's verbalized and written.  Elevate legs, cont compression stockings.  F/u prn. The patient voiced understanding and agreement to the plan.  [place], DO 11/12/17 3:42 PM

## 2017-11-16 ENCOUNTER — Other Ambulatory Visit: Payer: Self-pay

## 2017-11-16 ENCOUNTER — Emergency Department (HOSPITAL_BASED_OUTPATIENT_CLINIC_OR_DEPARTMENT_OTHER): Payer: Medicare Other

## 2017-11-16 ENCOUNTER — Encounter (HOSPITAL_BASED_OUTPATIENT_CLINIC_OR_DEPARTMENT_OTHER): Payer: Self-pay | Admitting: *Deleted

## 2017-11-16 ENCOUNTER — Emergency Department (HOSPITAL_BASED_OUTPATIENT_CLINIC_OR_DEPARTMENT_OTHER)
Admission: EM | Admit: 2017-11-16 | Discharge: 2017-11-16 | Disposition: A | Discharge status: Home / Self Care | Payer: Medicare Other | Attending: Emergency Medicine | Admitting: Emergency Medicine

## 2017-11-16 DIAGNOSIS — I130 Hypertensive heart and chronic kidney disease with heart failure and stage 1 through stage 4 chronic kidney disease, or unspecified chronic kidney disease: Secondary | ICD-10-CM | POA: Diagnosis not present

## 2017-11-16 DIAGNOSIS — N189 Chronic kidney disease, unspecified: Secondary | ICD-10-CM | POA: Diagnosis not present

## 2017-11-16 DIAGNOSIS — Z79899 Other long term (current) drug therapy: Secondary | ICD-10-CM | POA: Insufficient documentation

## 2017-11-16 DIAGNOSIS — R600 Localized edema: Secondary | ICD-10-CM

## 2017-11-16 DIAGNOSIS — R2240 Localized swelling, mass and lump, unspecified lower limb: Secondary | ICD-10-CM | POA: Diagnosis present

## 2017-11-16 DIAGNOSIS — Z7901 Long term (current) use of anticoagulants: Secondary | ICD-10-CM | POA: Diagnosis not present

## 2017-11-16 DIAGNOSIS — R2242 Localized swelling, mass and lump, left lower limb: Secondary | ICD-10-CM | POA: Diagnosis not present

## 2017-11-16 DIAGNOSIS — I5032 Chronic diastolic (congestive) heart failure: Secondary | ICD-10-CM | POA: Insufficient documentation

## 2017-11-16 DIAGNOSIS — I83891 Varicose veins of right lower extremities with other complications: Secondary | ICD-10-CM | POA: Insufficient documentation

## 2017-11-16 NOTE — Discharge Instructions (Signed)
Your ultrasound today was negative for blood clot.  I'm not sure of the exact cause of it, but I doubt an emergent process. Continue your antibiotics as previously prescribed.  Keep your leg elevated while you are off it.

## 2017-11-16 NOTE — ED Triage Notes (Signed)
Swelling and redness to his left lower leg. He saw his MD 4 days ago. He saw a dermatologist 3 days ago and started on an antibiotic for a skin infection. The swelling is worse today.

## 2017-11-16 NOTE — ED Notes (Signed)
Patient transported to Ultrasound 

## 2017-11-18 NOTE — ED Provider Notes (Signed)
[facility] EMERGENCY DEPARTMENT Provider Note   CSN: [id number] Arrival date & time: 11/16/17  1116     History   Chief Complaint Chief Complaint  Patient presents with  . Leg Swelling    HPI [name] [name] is a 84 y.o. male.  HPI   84 year old male with swelling and redness of his left lower extremity.  He has been evaluated recently for the same.  She was placed on antibiotic for possible cellulitis but symptoms are not improved.  He is not really having any significant pain he is more concerned about the size of his leg.  He has no respiratory complaints.  Denies any past history of DVT/PE.  Marland Kitchen  Past Medical History:  Diagnosis Date  . Abnormality of gait 05/27/2016  . Adenomatous polyps   . Carpal tunnel syndrome 06/25/2016   Right  . Depressive disorder, not elsewhere classified   . First degree AV block    Holter 3/18: NSR, PACs, PVCs, no AFib, no pauses.  Marland Kitchen Hereditary and idiopathic peripheral neuropathy 06/25/2016  . Hypertension   . Internal nasal lesion 05/15/2013  . Melanoma ([place])    Left Shoulder  . Mitral regurgitation   . MVP (mitral valve prolapse)    a. With severe MR s/p Complex valvuloplasty including artificial Gore-tex neochord placement x4, chordal transposition x1, chordal release x1, # 32 mm Sorin Memo 3D Ring Annuloplasty 2012. // b. Echo 2/18: mild LVH, EF 50-55, mild AI, MV repair with mild MR, mod LAE, mod RVE, severe RAE, severe TR  . Neuropathy   . Normal coronary arteries    a. Normal coronary anatomy by cath 2012.  . Osteoarthritis    Knees  . PAF (paroxysmal atrial fibrillation) ([place])    a. Post-op MVR 2012.  Marland Kitchen Personal history of colonic polyps   . Prostate cancer ([place])   . Pulmonary HTN ([place])    a. Mild-mod by cath 2012.  . Pure hypercholesterolemia   . PVC (premature ventricular contraction)   . Thrombocytopenia ([place])   . Vision abnormalities    Cornea scarring    Patient Active Problem List   Diagnosis Date Noted  .  Chronic renal insufficiency 10/06/2017  . Spondylosis without myelopathy or radiculopathy, lumbar region 09/16/2017  . Hypokalemia 04/30/2017  . Leg wound, left, sequela 04/30/2017  . Unstageable pressure ulcer of sacral region ([place]) 04/30/2017  . Fall   . Pressure injury of skin 04/29/2017  . Acute on chronic diastolic (congestive) heart failure ([place])   . PAH (pulmonary artery hypertension) ([place])   . Right-sided heart failure ([place]) 04/26/2017  . Chronic diastolic heart failure ([place]) 04/21/2017  . Pulmonary hypertension, primary ([place]) 04/21/2017  . Severe tricuspid regurgitation 03/12/2017  . Bilateral lower extremity edema 02/25/2017  . Anticoagulated 02/25/2017  . Varicose veins of right lower extremity with complications 61/44/3154  . Obstructive lung disease (generalized) ([place]) 09/26/2016  . Carpal tunnel syndrome 06/25/2016  . Hereditary and idiopathic peripheral neuropathy 06/25/2016  . Paresthesia 05/27/2016  . Abnormality of gait 05/27/2016  . Chest x-ray abnormality 01/09/2016  . Paroxysmal atrial flutter ([place]) 12/05/2015  . Constipation 06/13/2015  . Encounter for therapeutic drug monitoring 05/25/2014  . Syncope 04/03/2014  . Rhabdomyolysis 04/03/2014  . UTI (urinary tract infection) 04/03/2014  . Acute renal failure ([place]) 04/03/2014  . Leukocytosis 04/03/2014  . Internal nasal lesion 05/15/2013  . Low back pain 04/19/2013  . Melanoma ([place]) 09/30/2012  . Cough 03/26/2012  . Hx of mitral valve repair  11/19/2011  . Long term (current) use of anticoagulants 11/03/2011  . Decubitus skin ulcer 10/28/2011  . Pleural effusion due to congestive heart failure ([place]) 10/28/2011  . Atrial fibrillation ([place]) 10/07/2011  . S/P mitral valve repair 10/01/2011  . Valvular heart disease 08/21/2011  . Hearing loss 04/09/2011  . THROMBOCYTOPENIA 09/19/2010  . ADENOCARCINOMA, PROSTATE 09/17/2010  . CALLUS, LEFT FOOT 09/17/2010  . BACK PAIN, CHRONIC 09/17/2010  . TINEA PEDIS  05/28/2009  . DERMATITIS, ATOPIC 04/10/2009  . HYPERCHOLESTEROLEMIA 06/09/2008  . DEPRESSION 06/09/2008  . HYPERTENSION, BENIGN ESSENTIAL 06/09/2008  . GERD 06/09/2008  . OSTEOARTHRITIS, GENERALIZED, MULTIPLE JOINTS 06/09/2008  . MUSCLE SPASM, BACK 06/09/2008  . PERSONAL HISTORY MALIGNANT NEOPLASM PROSTATE 06/09/2008  . PERSONAL HISTORY OF MALIGNANT MELANOMA OF SKIN 06/09/2008  . ARRHYTHMIA, HX OF 06/09/2008  . Personal history of colonic polyps 06/09/2008    Past Surgical History:  Procedure Laterality Date  . CARDIAC CATHETERIZATION  09/2011   Pre-op for MVR -- normal coronaries.  Marland Kitchen CARDIOVERSION N/A 01/02/2016   Procedure: CARDIOVERSION;  Surgeon: Thayer Headings, MD;  Location: [facility] ENDOSCOPY;  Service: Cardiovascular;  Laterality: N/A;  . COLONOSCOPY W/ POLYPECTOMY    . INGUINAL HERNIA REPAIR  09/2009   Left  . KNEE ARTHROSCOPY      left x3  and right x2  . Melanoma Surgery     2001, 2005, 2006, 2009  . MITRAL VALVE REPAIR  10/01/2011   complex valvuloplasty with Goretex cord replacement and chordal transposition 9mm Sorin Memo 3D ring annuloplasty  . Nuclear Stress Test  09/2006   EF-64%, Normal  . PROSTATECTOMY  1993  . RIGHT HEART CATH N/A 04/29/2017   Procedure: Right Heart Cath;  Surgeon: Jolaine Artist, MD;  Location: [facility] CV LAB;  Service: Cardiovascular;  Laterality: N/A;  . ROOT CANAL  08-19-12  . ROTATOR CUFF REPAIR  2003   left  . TEE WITHOUT CARDIOVERSION  09/26/2011   Procedure: TRANSESOPHAGEAL ECHOCARDIOGRAM (TEE);  Surgeon: Lelon Perla, MD;  Location: [facility] ENDOSCOPY;  Service: Cardiovascular;  Laterality: N/A;  . US ECHOCARDIOGRAPHY  09/2009, 08/1011   mild LVH,mild AI,MVP with mild MR, mild-mod. TR with mild Pulm. HTN, EF-55-60%       Home Medications    Prior to Admission medications   Medication Sig Start Date End Date Taking? Authorizing Provider  amiodarone (PACERONE) 200 MG tablet Take 1 tablet (200 mg total) by mouth daily.  07/28/17   Larey Dresser, MD  b complex vitamins tablet Take 1 tablet by mouth daily.    [provider]  cycloSPORINE (RESTASIS) 0.05 % ophthalmic emulsion Place 1 drop into both eyes 2 (two) times daily.    [provider]  diltiazem (CARTIA XT) 300 MG 24 hr capsule Take 1 capsule (300 mg total) by mouth daily. 09/04/17   [name], MD  doxycycline (VIBRA-TABS) 100 MG tablet Take 1 tablet (100 mg total) by mouth 2 (two) times daily. 11/12/17   Shelda Pal, DO  ferrous sulfate 325 (65 FE) MG tablet Take 1 tablet (325 mg total) 2 (two) times daily with a meal by mouth. 09/23/17   Debbrah Alar, NP  gabapentin (NEURONTIN) 300 MG capsule Take 1 capsule (300 mg total) by mouth 2 (two) times daily. 09/16/17   Magnus Sinning, MD  glucosamine-chondroitin 500-400 MG tablet Take 1 tablet by mouth 2 (two) times daily.    [provider]  ketoconazole (NIZORAL) 2 % cream Apply 1 application topically daily.  10/07/17   Colon Branch, MD  metoprolol succinate (TOPROL-XL) 50 MG 24 hr tablet TAKE 1 TABLET DAILY, TAKE WITH OR IMMEDIATELY FOLLOWING A MEAL 10/07/17   Larey Dresser, MD  Multiple Vitamin (MULTIVITAMIN WITH MINERALS) TABS tablet Take 1 tablet by mouth every evening.     [provider]  MYRBETRIQ 50 MG TB24 tablet Take 1 tablet by mouth daily. 01/14/17   [provider]  potassium chloride SA (K-DUR,KLOR-CON) 20 MEQ tablet Take 2 tablets (40 mEq total) by mouth daily. 05/08/17   Arbutus Leas, NP  rosuvastatin (CRESTOR) 5 MG tablet Take 5 mg by mouth daily at 6 PM.  06/23/16   [provider]  rosuvastatin (CRESTOR) 5 MG tablet TAKE 1 TABLET DAILY 10/07/17   Larey Dresser, MD  TOPROL XL 50 MG 24 hr tablet Take 50 mg by mouth daily.  06/20/16   [provider]  torsemide (DEMADEX) 20 MG tablet Take 2 tablets (40 mg total) by mouth daily. 09/08/17   Bensimhon, Shaune Pascal, MD  traZODone (DESYREL) 100 MG tablet Take  300 mg by mouth at bedtime.  10/17/11   Nani Skillern, PA-C  vitamin C (ASCORBIC ACID) 500 MG tablet Take 500 mg by mouth 2 (two) times daily.    [provider]  warfarin (COUMADIN) 5 MG tablet TAKE 1 TABLET DAILY AS DIRECTED BY THE COUMADIN CLINIC 10/29/17   [name], MD    Family History Family History  Problem Relation Age of Onset  . Clotting disorder Brother        CVA's  . Arthritis Mother   . Hypertension Mother   . Stroke Mother   . Hypertension Father   . Psychosis Father        psychiatric care  . Colon cancer Neg Hx   . Stomach cancer Neg Hx   . Heart attack Neg Hx     Social History Social History   Tobacco Use  . Smoking status: Never Smoker  . Smokeless tobacco: Never Used  Substance Use Topics  . Alcohol use: No    Alcohol/week: 0.0 oz    Comment: Last drink in 2000  . Drug use: No     Allergies   Patient has no known allergies.   Review of Systems Review of Systems  All systems reviewed and negative, other than as noted in HPI.   Physical Exam Updated Vital Signs BP 134/78 (BP Location: Right Arm)   Pulse (!) 58   Temp 97.6 F (36.4 C) (Oral)   Resp 18   Ht 6\' 5"  (1.956 m)   Wt 74.4 kg (164 lb)   SpO2 98%   BMI 19.45 kg/m   Physical Exam  Constitutional: He appears well-developed and well-nourished. No distress.  HENT:  Head: Normocephalic and atraumatic.  Eyes: Conjunctivae are normal. Right eye exhibits no discharge. Left eye exhibits no discharge.  Neck: Neck supple.  Cardiovascular: Normal rate, regular rhythm and normal heart sounds. Exam reveals no gallop and no friction rub.  No murmur heard. Pulmonary/Chest: Effort normal and breath sounds normal. No respiratory distress.  Abdominal: Soft. He exhibits no distension. There is no tenderness.  Musculoskeletal: He exhibits edema. He exhibits no tenderness.  Swelling of the left lower leg as compared to the right.  He has no skin tenderness though.   Erythema without increased warmth.  Palpable DP pulse.  Sensation intact to light touch.  Neurological: He is alert.  Skin: Skin is warm  and dry.  Psychiatric: He has a normal mood and affect. His behavior is normal. Thought content normal.  Nursing note and vitals reviewed.    ED Treatments / Results  Labs (all labs ordered are listed, but only abnormal results are displayed) Labs Reviewed - No data to display  EKG  EKG Interpretation None       Radiology No results found.  Procedures Procedures (including critical care time)  Medications Ordered in ED Medications - No data to display   Initial Impression / Assessment and Plan / ED Course  I have reviewed the triage vital signs and the nursing notes.  Pertinent labs & imaging results that were available during my care of the patient were reviewed by me and considered in my medical decision making (see chart for details).     84 year old male with asymmetric swelling of left lower extremity.  Ultrasound is negative for DVT.  He has no pain and I think cellulitis is less likely.  Regardless, he is currently undergoing treatment to cover for this.  Final Clinical Impressions(s) / ED Diagnoses   Final diagnoses:  Leg edema    ED Discharge Orders    None       Virgel Manifold, MD 11/18/17 [phone]

## 2017-11-23 ENCOUNTER — Ambulatory Visit: Payer: Medicare Other | Admitting: Medical

## 2017-11-23 DIAGNOSIS — Z0289 Encounter for other administrative examinations: Secondary | ICD-10-CM

## 2017-11-24 ENCOUNTER — Inpatient Hospital Stay: Payer: Medicare Other | Admitting: Medical

## 2017-11-25 ENCOUNTER — Ambulatory Visit: Payer: Medicare Other | Admitting: Family

## 2017-11-25 ENCOUNTER — Encounter: Payer: Self-pay | Admitting: Family

## 2017-11-25 ENCOUNTER — Telehealth: Payer: Self-pay | Admitting: Family

## 2017-11-25 VITALS — BP 110/59 | HR 56 | Temp 97.4°F | Resp 16 | Ht 76.0 in | Wt 164.0 lb

## 2017-11-25 DIAGNOSIS — N289 Disorder of kidney and ureter, unspecified: Secondary | ICD-10-CM | POA: Diagnosis not present

## 2017-11-25 DIAGNOSIS — I509 Heart failure, unspecified: Secondary | ICD-10-CM | POA: Diagnosis not present

## 2017-11-25 DIAGNOSIS — E785 Hyperlipidemia, unspecified: Secondary | ICD-10-CM | POA: Diagnosis not present

## 2017-11-25 DIAGNOSIS — I4891 Unspecified atrial fibrillation: Secondary | ICD-10-CM

## 2017-11-25 DIAGNOSIS — L0390 Cellulitis, unspecified: Secondary | ICD-10-CM

## 2017-11-25 DIAGNOSIS — D509 Iron deficiency anemia, unspecified: Secondary | ICD-10-CM

## 2017-11-25 DIAGNOSIS — Z5181 Encounter for therapeutic drug level monitoring: Secondary | ICD-10-CM | POA: Diagnosis not present

## 2017-11-25 LAB — CBC WITH DIFFERENTIAL/PLATELET
BASOS PCT: 0.3 % (ref 0.0–3.0)
Basophils Absolute: 0 10*3/uL (ref 0.0–0.1)
EOS PCT: 0.2 % (ref 0.0–5.0)
Eosinophils Absolute: 0 10*3/uL (ref 0.0–0.7)
HCT: 38 % — ABNORMAL LOW (ref 39.0–52.0)
Hemoglobin: 12.3 g/dL — ABNORMAL LOW (ref 13.0–17.0)
LYMPHS ABS: 1.2 10*3/uL (ref 0.7–4.0)
Lymphocytes Relative: 14.2 % (ref 12.0–46.0)
MCHC: 32.2 g/dL (ref 30.0–36.0)
MCV: 84.9 fl (ref 78.0–100.0)
MONO ABS: 1.4 10*3/uL — AB (ref 0.1–1.0)
MONOS PCT: 16.6 % — AB (ref 3.0–12.0)
NEUTROS PCT: 68.7 % (ref 43.0–77.0)
Neutro Abs: 5.9 10*3/uL (ref 1.4–7.7)
Platelets: 107 10*3/uL — ABNORMAL LOW (ref 150.0–400.0)
RBC: 4.48 Mil/uL (ref 4.22–5.81)
RDW: 19.8 % — AB (ref 11.5–15.5)
WBC: 8.5 10*3/uL (ref 4.0–10.5)

## 2017-11-25 LAB — COMPREHENSIVE METABOLIC PANEL
ALBUMIN: 3.9 g/dL (ref 3.5–5.2)
ALK PHOS: 57 U/L (ref 39–117)
ALT: 16 U/L (ref 0–53)
AST: 16 U/L (ref 0–37)
BUN: 35 mg/dL — AB (ref 6–23)
CHLORIDE: 100 meq/L (ref 96–112)
CO2: 34 mEq/L — ABNORMAL HIGH (ref 19–32)
Calcium: 9.9 mg/dL (ref 8.4–10.5)
Creatinine, Ser: 1.48 mg/dL (ref 0.40–1.50)
GFR: 48.05 mL/min — AB (ref >60.00)
GLUCOSE: 81 mg/dL (ref 70–99)
POTASSIUM: 4.8 meq/L (ref 3.5–5.1)
SODIUM: 139 meq/L (ref 135–145)
Total Bilirubin: 0.7 mg/dL (ref 0.2–1.2)
Total Protein: 7.2 g/dL (ref 6.0–8.3)

## 2017-11-25 LAB — IRON: Iron: 26 ug/dL — ABNORMAL LOW (ref 42–165)

## 2017-11-25 LAB — TSH: TSH: 1.39 u[IU]/mL (ref 0.35–4.50)

## 2017-11-25 LAB — LIPID PANEL
Cholesterol: 103 mg/dL (ref 0–200)
HDL: 46.8 mg/dL (ref >39.00)
LDL Cholesterol: 44 mg/dL (ref 0–99)
NONHDL: 56.02
TRIGLYCERIDES: 60 mg/dL (ref 0.0–149.0)
Total CHOL/HDL Ratio: 2
VLDL: 12 mg/dL (ref 0.0–40.0)

## 2017-11-25 LAB — FERRITIN: Ferritin: 44.4 ng/mL (ref 22.0–322.0)

## 2017-11-25 LAB — PROTIME-INR
INR: 1.8 ratio — AB (ref 0.8–1.0)
Prothrombin Time: 19.6 s — ABNORMAL HIGH (ref 9.6–13.1)

## 2017-11-25 NOTE — Patient Instructions (Addendum)
Please continue your current meds. For swelling, please wear your compression stockings and elevate you legs as able. Please schedule follow up with your cardiologist. Call if increased redness/swelling, weight gain or fever.  Please complete lab work prior to leaving.

## 2017-11-25 NOTE — Progress Notes (Signed)
Subjective:    Patient ID: [name] [name], male    DOB: 27-Oct-1931, 84 y.o.   MRN: [id number]  HPI   Patient is an 84 year old male who presents today for follow-up of his lower extremity cellulitis.  He was seen by Dr. Nani Ravens on November 12, 2017.  He was noted to have erythema and swelling of the left lower extremity.  The patient was treated with doxycycline.  Wt Readings from Last 3 Encounters:  11/25/17 164 lb (74.4 kg)  11/16/17 164 lb (74.4 kg)  11/12/17 164 lb (74.4 kg)   He then returned to the emergency department on December 31 because he did not feel that his symptoms were improving. ER note is reviewed.  He underwent a left lower extremity Doppler during this visit which was negative for DVT.  He continues his Demadex due to CHF in the setting of diastolic CHF/valvular heart disease.  This is managed by the heart failure clinic.   Review of Systems    see HPI  Past Medical History:  Diagnosis Date  . Abnormality of gait 05/27/2016  . Adenomatous polyps   . Carpal tunnel syndrome 06/25/2016   Right  . Depressive disorder, not elsewhere classified   . First degree AV block    Holter 3/18: NSR, PACs, PVCs, no AFib, no pauses.  Marland Kitchen Hereditary and idiopathic peripheral neuropathy 06/25/2016  . Hypertension   . Internal nasal lesion 05/15/2013  . Melanoma ([place])    Left Shoulder  . Mitral regurgitation   . MVP (mitral valve prolapse)    a. With severe MR s/p Complex valvuloplasty including artificial Gore-tex neochord placement x4, chordal transposition x1, chordal release x1, # 32 mm Sorin Memo 3D Ring Annuloplasty 2012. // b. Echo 2/18: mild LVH, EF 50-55, mild AI, MV repair with mild MR, mod LAE, mod RVE, severe RAE, severe TR  . Neuropathy   . Normal coronary arteries    a. Normal coronary anatomy by cath 2012.  . Osteoarthritis    Knees  . PAF (paroxysmal atrial fibrillation) ([place])    a. Post-op MVR 2012.  Marland Kitchen Personal history of colonic polyps   . Prostate cancer  ([place])   . Pulmonary HTN ([place])    a. Mild-mod by cath 2012.  . Pure hypercholesterolemia   . PVC (premature ventricular contraction)   . Thrombocytopenia ([place])   . Vision abnormalities    Cornea scarring     Social History   Socioeconomic History  . Marital status: Widowed    Spouse name: Not on file  . Number of children: 2  . Years of education: 58  . Highest education level: Not on file  Social Needs  . Financial resource strain: Not on file  . Food insecurity - worry: Not on file  . Food insecurity - inability: Not on file  . Transportation needs - medical: Not on file  . Transportation needs - non-medical: Not on file  Occupational History  . Not on file  Tobacco Use  . Smoking status: Never Smoker  . Smokeless tobacco: Never Used  Substance and Sexual Activity  . Alcohol use: No    Alcohol/week: 0.0 oz    Comment: Last drink in 2000  . Drug use: No  . Sexual activity: Not on file  Other Topics Concern  . Not on file  Social History Narrative   Retired - Optometrist   Widower   2 children (one in [place] and on one in [place])  Drinks 1 cup of coffee per day    Past Surgical History:  Procedure Laterality Date  . CARDIAC CATHETERIZATION  09/2011   Pre-op for MVR -- normal coronaries.  Marland Kitchen CARDIOVERSION N/A 01/02/2016   Procedure: CARDIOVERSION;  Surgeon: Thayer Headings, MD;  Location: [facility] ENDOSCOPY;  Service: Cardiovascular;  Laterality: N/A;  . COLONOSCOPY W/ POLYPECTOMY    . INGUINAL HERNIA REPAIR  09/2009   Left  . KNEE ARTHROSCOPY      left x3  and right x2  . Melanoma Surgery     2001, 2005, 2006, 2009  . MITRAL VALVE REPAIR  10/01/2011   complex valvuloplasty with Goretex cord replacement and chordal transposition 50mm Sorin Memo 3D ring annuloplasty  . Nuclear Stress Test  09/2006   EF-64%, Normal  . PROSTATECTOMY  1993  . RIGHT HEART CATH N/A 04/29/2017   Procedure: Right Heart Cath;  Surgeon: Jolaine Artist, MD;  Location: [place] [facility]  LAB;  Service: Cardiovascular;  Laterality: N/A;  . ROOT CANAL  08-19-12  . ROTATOR CUFF REPAIR  2003   left  . TEE WITHOUT CARDIOVERSION  09/26/2011   Procedure: TRANSESOPHAGEAL ECHOCARDIOGRAM (TEE);  Surgeon: Lelon Perla, MD;  Location: [facility] ENDOSCOPY;  Service: Cardiovascular;  Laterality: N/A;  . US ECHOCARDIOGRAPHY  09/2009, 08/1011   mild LVH,mild AI,MVP with mild MR, mild-mod. TR with mild Pulm. HTN, EF-55-60%    Family History  Problem Relation Age of Onset  . Clotting disorder Brother        CVA's  . Arthritis Mother   . Hypertension Mother   . Stroke Mother   . Hypertension Father   . Psychosis Father        psychiatric care  . Colon cancer Neg Hx   . Stomach cancer Neg Hx   . Heart attack Neg Hx     No Known Allergies  Current Outpatient Medications on File Prior to Visit  Medication Sig Dispense Refill  . amiodarone (PACERONE) 200 MG tablet Take 1 tablet (200 mg total) by mouth daily. 30 tablet 11  . b complex vitamins tablet Take 1 tablet by mouth daily.    . cycloSPORINE (RESTASIS) 0.05 % ophthalmic emulsion Place 1 drop into both eyes 2 (two) times daily.    Marland Kitchen diltiazem (CARTIA XT) 300 MG 24 hr capsule Take 1 capsule (300 mg total) by mouth daily. 90 capsule 1  . ferrous sulfate 325 (65 FE) MG tablet Take 1 tablet (325 mg total) 2 (two) times daily with a meal by mouth.  3  . gabapentin (NEURONTIN) 300 MG capsule Take 1 capsule (300 mg total) by mouth 2 (two) times daily. 180 capsule 3  . glucosamine-chondroitin 500-400 MG tablet Take 1 tablet by mouth 2 (two) times daily.    Marland Kitchen ketoconazole (NIZORAL) 2 % cream Apply 1 application topically daily. 30 g 0  . metoprolol succinate (TOPROL-XL) 50 MG 24 hr tablet TAKE 1 TABLET DAILY, TAKE WITH OR IMMEDIATELY FOLLOWING A MEAL 90 tablet 3  . Multiple Vitamin (MULTIVITAMIN WITH MINERALS) TABS tablet Take 1 tablet by mouth every evening.     Marland Kitchen MYRBETRIQ 50 MG TB24 tablet Take 1 tablet by mouth daily.    . potassium  chloride SA (K-DUR,KLOR-CON) 20 MEQ tablet Take 2 tablets (40 mEq total) by mouth daily. 180 tablet 3  . rosuvastatin (CRESTOR) 5 MG tablet Take 5 mg by mouth daily at 6 PM.     . rosuvastatin (CRESTOR) 5 MG tablet TAKE 1  TABLET DAILY 90 tablet 3  . TOPROL XL 50 MG 24 hr tablet Take 50 mg by mouth daily.     Marland Kitchen torsemide (DEMADEX) 20 MG tablet Take 2 tablets (40 mg total) by mouth daily. 180 tablet 3  . traZODone (DESYREL) 100 MG tablet Take 300 mg by mouth at bedtime.     . vitamin C (ASCORBIC ACID) 500 MG tablet Take 500 mg by mouth 2 (two) times daily.    Marland Kitchen warfarin (COUMADIN) 5 MG tablet TAKE 1 TABLET DAILY AS DIRECTED BY THE COUMADIN CLINIC 30 tablet 3  . [DISCONTINUED] pantoprazole (PROTONIX) 40 MG tablet Take 1 tablet (40 mg total) by mouth daily before breakfast.     No current facility-administered medications on file prior to visit.     BP (!) 110/59 (BP Location: Left Arm, Patient Position: Sitting, Cuff Size: Small)   Pulse (!) 56   Temp (!) 97.4 F (36.3 C) (Oral)   Resp 16   Ht 6\' 4"  (1.93 m)   Wt 164 lb (74.4 kg)   SpO2 97%   BMI 19.96 kg/m    Objective:   Physical Exam  Constitutional: He is oriented to person, place, and time. He appears well-developed and well-nourished. No distress.  HENT:  Head: Normocephalic and atraumatic.  Cardiovascular: Normal rate. An irregular rhythm present.  No murmur heard. Pulmonary/Chest: Effort normal and breath sounds normal. No respiratory distress. He has no wheezes. He has no rales.  Musculoskeletal:  He is noted to have left lower extremity edema 3+ right lower extremity edema 2+  Neurological: He is alert and oriented to person, place, and time.  Skin: Skin is warm and dry.  Bilateral lower extremity hyperpigmentation consistent with chronic venous stasis. No significant erythema noted in the left foot.  Psychiatric: He has a normal mood and affect. His behavior is normal. Thought content normal.          Assessment &  Plan:  Looks like he never completed lab work which was ordered by pcp back in November.  Will ask patient to stop by the lab today to complete.  Cellulitis- clinically resolved.  He is concerned about the discoloration of his legs.  I advised him that this is due to venous stasis and is unlikely to go away.  I did discuss having him restart his compression stockings and elevate his feet as able.   Atrial fibrillation- He is maintained on Coumadin therapy due to atrial fibrillation and I will add an INR to his lab draw today in the setting of recent antibiotic use.  Of note he did have a small scratch on the left shin which was bleeding upon arrival.  Area was cleansed and a bandage was placed.  CHF- appears euvolemic.  Continue demade, follow up with cardiology.

## 2017-11-25 NOTE — Telephone Encounter (Signed)
Please let pt know that INR is low. Take full tab of coumadin Thursday night then continue 1/2 every night except full tab on thursdays- keep upcoming appointment with coumadin clinic.    Also, he is anemic- is he taking iron twice daily? If not please start.   Candace- please see coumadin adjustment for your records-thanks!

## 2017-11-26 NOTE — Telephone Encounter (Signed)
Results given to patient, advised to take 1 whole coumadin today only and continue 1/2 after that. He reports he is not taking Iron at this time due to constipation, advised to start taking twice a day with something for constipation

## 2017-11-26 NOTE — Telephone Encounter (Signed)
Changes noted. Thanks.

## 2017-12-01 ENCOUNTER — Encounter (HOSPITAL_COMMUNITY): Payer: Self-pay

## 2017-12-01 ENCOUNTER — Ambulatory Visit (HOSPITAL_COMMUNITY)
Admission: RE | Admit: 2017-12-01 | Discharge: 2017-12-01 | Disposition: A | Discharge status: Home / Self Care | Payer: Medicare Other | Source: Ambulatory Visit | Attending: Cardiology | Admitting: Cardiology

## 2017-12-01 VITALS — BP 102/70 | HR 55 | Wt 172.0 lb

## 2017-12-01 DIAGNOSIS — I110 Hypertensive heart disease with heart failure: Secondary | ICD-10-CM | POA: Diagnosis not present

## 2017-12-01 DIAGNOSIS — F329 Major depressive disorder, single episode, unspecified: Secondary | ICD-10-CM | POA: Diagnosis not present

## 2017-12-01 DIAGNOSIS — R600 Localized edema: Secondary | ICD-10-CM | POA: Insufficient documentation

## 2017-12-01 DIAGNOSIS — I5082 Biventricular heart failure: Secondary | ICD-10-CM | POA: Insufficient documentation

## 2017-12-01 DIAGNOSIS — Z7901 Long term (current) use of anticoagulants: Secondary | ICD-10-CM | POA: Diagnosis not present

## 2017-12-01 DIAGNOSIS — M170 Bilateral primary osteoarthritis of knee: Secondary | ICD-10-CM | POA: Insufficient documentation

## 2017-12-01 DIAGNOSIS — Z953 Presence of xenogenic heart valve: Secondary | ICD-10-CM | POA: Insufficient documentation

## 2017-12-01 DIAGNOSIS — G5600 Carpal tunnel syndrome, unspecified upper limb: Secondary | ICD-10-CM | POA: Diagnosis not present

## 2017-12-01 DIAGNOSIS — I5032 Chronic diastolic (congestive) heart failure: Secondary | ICD-10-CM

## 2017-12-01 DIAGNOSIS — Z8546 Personal history of malignant neoplasm of prostate: Secondary | ICD-10-CM | POA: Insufficient documentation

## 2017-12-01 DIAGNOSIS — Z79899 Other long term (current) drug therapy: Secondary | ICD-10-CM | POA: Diagnosis not present

## 2017-12-01 DIAGNOSIS — E7800 Pure hypercholesterolemia, unspecified: Secondary | ICD-10-CM | POA: Diagnosis not present

## 2017-12-01 DIAGNOSIS — R21 Rash and other nonspecific skin eruption: Secondary | ICD-10-CM

## 2017-12-01 DIAGNOSIS — Z9119 Patient's noncompliance with other medical treatment and regimen: Secondary | ICD-10-CM | POA: Insufficient documentation

## 2017-12-01 DIAGNOSIS — I440 Atrioventricular block, first degree: Secondary | ICD-10-CM | POA: Insufficient documentation

## 2017-12-01 DIAGNOSIS — X58XXXA Exposure to other specified factors, initial encounter: Secondary | ICD-10-CM | POA: Insufficient documentation

## 2017-12-01 DIAGNOSIS — G629 Polyneuropathy, unspecified: Secondary | ICD-10-CM | POA: Diagnosis not present

## 2017-12-01 DIAGNOSIS — S81811A Laceration without foreign body, right lower leg, initial encounter: Secondary | ICD-10-CM

## 2017-12-01 DIAGNOSIS — I493 Ventricular premature depolarization: Secondary | ICD-10-CM | POA: Diagnosis not present

## 2017-12-01 DIAGNOSIS — I4892 Unspecified atrial flutter: Secondary | ICD-10-CM | POA: Insufficient documentation

## 2017-12-01 DIAGNOSIS — I480 Paroxysmal atrial fibrillation: Secondary | ICD-10-CM

## 2017-12-01 DIAGNOSIS — I341 Nonrheumatic mitral (valve) prolapse: Secondary | ICD-10-CM | POA: Insufficient documentation

## 2017-12-01 MED ORDER — TOLNAFTATE 1 % EX AERP: INHALATION_SPRAY | Freq: Two times a day (BID) | CUTANEOUS | 0 refills | Status: DC

## 2017-12-01 MED ORDER — TORSEMIDE 20 MG PO TABS: 40.0000 mg | ORAL_TABLET | Freq: Every day | ORAL | 3 refills | Status: DC

## 2017-12-01 NOTE — Progress Notes (Signed)
Advanced Heart Failure Clinic Consult Note   Referring Physician: Dr. Irish Lack Primary Care: Penni Homans, MD Primary Cardiologist: Dr. Irish Lack ( Previously Mare Ferrari) Primary HF: New (Dr. Haroldine Laws)   HPI: [name] [name] ([place]) is an 84 y.o. male with history of diastolic HF, PAF/Aflutter on coumadin, and h/o severe MV prolapse s/p MV repair 10/01/11 who is referred for further evaluation of his heart failure.   Pt seen in Dr. Irish Lack office 03/11/17 and noted to have worsening peripheral edema. Thought to be related to RV failure.  Pt admitted non-compliance with second lasix dose some days. RHC suggested but pt prefers to avoid invasive procedures at this time. Lasix increased and referred to [facility] for wound care of BLEs.   He was admitted 04/26/17-04/30/17 with a fall, no syncope. Also with right sided heart failure. RHC showed normal cardiac output. No evidence of PAH. Diuresed with IV lasix, weight down about 10 pounds. Discharge weight was 190 pounds. He was discharged to SNF.   Today he returns for HF follow up. Overall feeling fine. Denies SOB/PND/Orthopnea. Appetite ok. Meals are prepared by caregivers. No fever or chills. Having increased lower extremity edema. Complaining of rash on his buttocks. Taking all medications.  Lives alone with caregivers.   ECHO 09/04/2017  LVEF 55-60%  Severe TR, RV moderately to severely dilated. Flattened septum  Personally reviewed  Echo 12/19/16 with LVEF 50-55%, Mild AI, s/p MVR with mild residual MR, Mod LAE, Mod RV dilation, severe RAE, Severe TR.  RHC  RA = 8 RV = 36/7 PA = 33/4 (15) PCW = 6 Fick cardiac output/index = 7.7/3.6 PVR = 1.2 WU Ao sat = 99% PA sat = 69%, 72% SVC sat = 69%  Past Medical History:  Diagnosis Date  . Abnormality of gait 05/27/2016  . Adenomatous polyps   . Carpal tunnel syndrome 06/25/2016   Right  . Depressive disorder, not elsewhere classified   . First degree AV block    Holter 3/18: NSR, PACs, PVCs,  no AFib, no pauses.  Marland Kitchen Hereditary and idiopathic peripheral neuropathy 06/25/2016  . Hypertension   . Internal nasal lesion 05/15/2013  . Melanoma ([place])    Left Shoulder  . Mitral regurgitation   . MVP (mitral valve prolapse)    a. With severe MR s/p Complex valvuloplasty including artificial Gore-tex neochord placement x4, chordal transposition x1, chordal release x1, # 32 mm Sorin Memo 3D Ring Annuloplasty 2012. // b. Echo 2/18: mild LVH, EF 50-55, mild AI, MV repair with mild MR, mod LAE, mod RVE, severe RAE, severe TR  . Neuropathy   . Normal coronary arteries    a. Normal coronary anatomy by cath 2012.  . Osteoarthritis    Knees  . PAF (paroxysmal atrial fibrillation) ([place])    a. Post-op MVR 2012.  Marland Kitchen Personal history of colonic polyps   . Prostate cancer ([place])   . Pulmonary HTN ([place])    a. Mild-mod by cath 2012.  . Pure hypercholesterolemia   . PVC (premature ventricular contraction)   . Thrombocytopenia ([place])   . Vision abnormalities    Cornea scarring   Current Outpatient Medications  Medication Sig Dispense Refill  . amiodarone (PACERONE) 200 MG tablet Take 1 tablet (200 mg total) by mouth daily. 30 tablet 11  . diltiazem (CARTIA XT) 300 MG 24 hr capsule Take 1 capsule (300 mg total) by mouth daily. 90 capsule 1  . metoprolol succinate (TOPROL-XL) 50 MG 24 hr tablet TAKE 1 TABLET DAILY, TAKE  WITH OR IMMEDIATELY FOLLOWING A MEAL 90 tablet 3  . potassium chloride SA (K-DUR,KLOR-CON) 20 MEQ tablet Take 2 tablets (40 mEq total) by mouth daily. 180 tablet 3  . rosuvastatin (CRESTOR) 5 MG tablet TAKE 1 TABLET DAILY 90 tablet 3  . torsemide (DEMADEX) 20 MG tablet Take 2 tablets (40 mg total) by mouth daily. 180 tablet 3  . traZODone (DESYREL) 100 MG tablet Take 300 mg by mouth at bedtime.     . vitamin C (ASCORBIC ACID) 500 MG tablet Take 500 mg by mouth 2 (two) times daily.    Marland Kitchen warfarin (COUMADIN) 5 MG tablet TAKE 1 TABLET DAILY AS DIRECTED BY THE COUMADIN CLINIC 30 tablet 3  . b  complex vitamins tablet Take 1 tablet by mouth daily.    . cycloSPORINE (RESTASIS) 0.05 % ophthalmic emulsion Place 1 drop into both eyes 2 (two) times daily.    . ferrous sulfate 325 (65 FE) MG tablet Take 1 tablet (325 mg total) 2 (two) times daily with a meal by mouth.  3  . gabapentin (NEURONTIN) 300 MG capsule Take 1 capsule (300 mg total) by mouth 2 (two) times daily. 180 capsule 3  . glucosamine-chondroitin 500-400 MG tablet Take 1 tablet by mouth 2 (two) times daily.    Marland Kitchen ketoconazole (NIZORAL) 2 % cream Apply 1 application topically daily. 30 g 0  . Multiple Vitamin (MULTIVITAMIN WITH MINERALS) TABS tablet Take 1 tablet by mouth every evening.     Marland Kitchen MYRBETRIQ 50 MG TB24 tablet Take 1 tablet by mouth daily.     No current facility-administered medications for this encounter.    No Known Allergies   Social History   Socioeconomic History  . Marital status: Widowed    Spouse name: Not on file  . Number of children: 2  . Years of education: 49  . Highest education level: Not on file  Social Needs  . Financial resource strain: Not on file  . Food insecurity - worry: Not on file  . Food insecurity - inability: Not on file  . Transportation needs - medical: Not on file  . Transportation needs - non-medical: Not on file  Occupational History  . Not on file  Tobacco Use  . Smoking status: Never Smoker  . Smokeless tobacco: Never Used  Substance and Sexual Activity  . Alcohol use: No    Alcohol/week: 0.0 oz    Comment: Last drink in 2000  . Drug use: No  . Sexual activity: Not on file  Other Topics Concern  . Not on file  Social History Narrative   Retired - Optometrist   Widower   2 children (one in [place] and on one in [place])    Drinks 1 cup of coffee per day   Family History  Problem Relation Age of Onset  . Clotting disorder Brother        CVA's  . Arthritis Mother   . Hypertension Mother   . Stroke Mother   . Hypertension Father   . Psychosis Father         psychiatric care  . Colon cancer Neg Hx   . Stomach cancer Neg Hx   . Heart attack Neg Hx    Vitals:   12/01/17 1213  BP: 102/70  Pulse: (!) 55  SpO2: 92%  Weight: 172 lb (78 kg)   Wt Readings from Last 3 Encounters:  12/01/17 172 lb (78 kg)  11/25/17 164 lb (74.4 kg)  11/16/17 164 lb (  74.4 kg)    PHYSICAL EXAM: General:  [place] . No resp difficulty. Arrived in a wheel chair.  HEENT: normal Neck: supple. JVP 8-9. Carotids 2+ bilat; no bruits. No lymphadenopathy or thryomegaly appreciated. Cor: PMI nondisplaced. Regular rate & rhythm. No rubs, gallops or murmurs. Lungs: clear Abdomen: soft, nontender, nondistended. No hepatosplenomegaly. No bruits or masses. Good bowel sounds. Extremities: no cyanosis, clubbing, rash, edema Neuro: alert & orientedx3, cranial nerves grossly intact. moves all 4 extremities w/o difficulty. Affect pleasant Skin: RLE and LLE chronic hyperpigmentation. Posterior aspect of RLE partial thickness skin loss .5.x5 cm. Minimal exudate. Red rash buttocks with satellite lesions.     ASSESSMENT & PLAN: 1. Combined diastolic HF with R>L symptoms in the setting of longstanding MV disease.  -Continue torsemide to 40 mg daily and 40 mg if needed.  - Continue Toprol XL 50 mg daily.  - Continue compression hose daily.    2. PAF -Regular pulse.  -Continue diltiazem 300mg  + Amio 200mg  daily.  - Continue warfarin for anticoagulation  3. Severe TR - As above, due to right sided heart failure.   4. PAH - last RHC without severe PAH.   5. Severe MVP S/P Bioprosthetic MVR   6. RLE Partial thickness wound - skin tear cover with foam dressing for 7 days .   7. Rash- buttocks- appears fungal. Suggested antifungal powder twice daily until rash resolves.    Follow up 4 months with Dr Haroldine Laws  Darrick Grinder, NP 12/01/17

## 2017-12-01 NOTE — Patient Instructions (Addendum)
May take extra 20 mg torsemide tablet once daily as needed for 3 lbs or more weight gain.  Apply spray fungal powder to affected area twice daily as needed for flare ups.  Follow up 4 months with Dr. Haroldine Laws. We will call you closer to this time, or you may call our office to schedule 1 month before you are due to be seen. Take all medication as prescribed the day of your appointment. Bring all medications with you to your appointment.  Do the following things EVERYDAY: 1) Weigh yourself in the morning before breakfast. Write it down and keep it in a log. 2) Take your medicines as prescribed 3) Eat low salt foods-Limit salt (sodium) to 2000 mg per day.  4) Stay as active as you can everyday 5) Limit all fluids for the day to less than 2 liters

## 2017-12-03 ENCOUNTER — Ambulatory Visit (INDEPENDENT_AMBULATORY_CARE_PROVIDER_SITE_OTHER): Payer: Medicare Other

## 2017-12-03 DIAGNOSIS — I4891 Unspecified atrial fibrillation: Secondary | ICD-10-CM

## 2017-12-03 DIAGNOSIS — I4819 Other persistent atrial fibrillation: Secondary | ICD-10-CM

## 2017-12-03 DIAGNOSIS — I481 Persistent atrial fibrillation: Secondary | ICD-10-CM

## 2017-12-03 DIAGNOSIS — Z5181 Encounter for therapeutic drug level monitoring: Secondary | ICD-10-CM | POA: Diagnosis not present

## 2017-12-03 LAB — POCT INR: INR: 2.8

## 2017-12-03 NOTE — Patient Instructions (Signed)
Description   Continue taking same dosage 1/2 tablet daily. Recheck in 4 weeks. Call [phone] if scheduled for any procedures or on any new medications

## 2017-12-06 ENCOUNTER — Other Ambulatory Visit: Payer: Self-pay | Admitting: Physician Assistant

## 2017-12-07 ENCOUNTER — Other Ambulatory Visit: Payer: Medicare Other

## 2017-12-07 NOTE — Telephone Encounter (Signed)
This is a CHF pt 

## 2017-12-08 ENCOUNTER — Telehealth: Payer: Self-pay | Admitting: *Deleted

## 2017-12-08 DIAGNOSIS — D509 Iron deficiency anemia, unspecified: Secondary | ICD-10-CM

## 2017-12-08 DIAGNOSIS — D649 Anemia, unspecified: Secondary | ICD-10-CM

## 2017-12-08 NOTE — Telephone Encounter (Signed)
Pt. Returned phone call.  Reviewed note by Dr. Charlett Blake with recommendations for increasing iron in diet, due to anemia.  Stated with his Warfarin, he is not able to eat green leafy vegetables.  Also, reported he has been taking his Ferrous Sulfate inconsistently, due to side effect of constipation.  Reported he takes Miralax daily.  Reviewed with pt. other food sources that are high in iron; ie: shellfish, liver/ organ meats, lean red meat,  pumpkin seeds, quinoa, dark chocolate, legumes, [place], and tofu.  Verb. Understanding.  Scheduled repeat  Labs for CBC w/ differential, Ferritin, and Iron in March.  Will send this message to Dr. Charlett Blake.

## 2017-12-08 NOTE — Telephone Encounter (Signed)
He should have a follow up lab draw in 3 months please.

## 2017-12-08 NOTE — Telephone Encounter (Signed)
[name] [name]-- pt was scheduled for lab visit on 12/23/17 for cbc, iron and ferritin.  It looks like these were drawn when pt saw you on 11/25/17.  I cancelled his lab visit for 12/23/17.  Please advise if pt should return on 12/23/17 for above labs anyway?

## 2017-12-08 NOTE — Telephone Encounter (Signed)
See additional phone note from 12/08/17.

## 2017-12-08 NOTE — Telephone Encounter (Signed)
Have him try mixing Benefiber powder into his Miralax and take these once or twice daily to keep the bowels moving.

## 2017-12-08 NOTE — Telephone Encounter (Signed)
Patient had complete labs run when he came in on 1/9, no further labs at this time. Mild anemia noted. Increase leafy greens, consider increased lean red meat and using cast iron cookware. Continue to monitor, report any concerns. Recheck cbc ideally with a visit in 1-2 months given how many different things have happened past few months. Labs no matter what to check anemia, nothing else concerning in labs

## 2017-12-08 NOTE — Telephone Encounter (Signed)
Pt came in to the lab yesterday for a lab appt. No future orders were in the system. Appointment was scheduled from follow up with Dr Charlett Blake at November visit.  Pt isn't scheduled to see PCP until May.  Please advise what labs should be drawn and dx codes please?

## 2017-12-08 NOTE — Telephone Encounter (Signed)
Attempted to reach pt. Left message to return my call. Labs that were drawn Monday are not needed at this time due to multiple labs for his acute visits recently already taken. Per Dr Charlett Blake, pt will need to schedule lab appt for March for iron, ferritin and cbc w/diff.  No other labs needed per PCP response below. Future lab orders have been entered.   [name], MD  You; Magdalene Molly, [place]; Bartholome Bill, RMA 2 hours ago (12:54 PM)     Patient had complete labs run when he came in on 1/9, no further labs at this time. Mild anemia noted. Increase leafy greens, consider increased lean red meat and using cast iron cookware. Continue to monitor, report any concerns. Recheck cbc ideally with a visit in 1-2 months given how many different things have happened past few months. Labs no matter what to check anemia, nothing else concerning in labs

## 2017-12-09 NOTE — Telephone Encounter (Signed)
Notified pt and he voices understanding. 

## 2017-12-23 ENCOUNTER — Other Ambulatory Visit: Payer: Medicare Other

## 2017-12-31 ENCOUNTER — Ambulatory Visit (INDEPENDENT_AMBULATORY_CARE_PROVIDER_SITE_OTHER): Payer: Medicare Other | Admitting: Pharmacist

## 2017-12-31 ENCOUNTER — Encounter (INDEPENDENT_AMBULATORY_CARE_PROVIDER_SITE_OTHER): Payer: Self-pay

## 2017-12-31 DIAGNOSIS — I4891 Unspecified atrial fibrillation: Secondary | ICD-10-CM | POA: Diagnosis not present

## 2017-12-31 DIAGNOSIS — Z5181 Encounter for therapeutic drug level monitoring: Secondary | ICD-10-CM

## 2017-12-31 LAB — POCT INR: INR: 2

## 2017-12-31 NOTE — Patient Instructions (Signed)
Description   Continue taking same dosage 1/2 tablet daily. Recheck in 6 weeks. Call [phone] if scheduled for any procedures or on any new medications

## 2018-01-11 ENCOUNTER — Telehealth: Payer: Self-pay | Admitting: *Deleted

## 2018-01-11 NOTE — Telephone Encounter (Signed)
Spoke with patient and advised him that there is no interaction between his hydrocodone and warfarin.

## 2018-01-11 NOTE — Telephone Encounter (Addendum)
Voicemail received that pt is taking hydrocodone prn for dental pain. Pt's caregiver requested a callback regarding this. Will call pt to let him know the med does not interact & is safe to take with Coumadin.  01/11/18 @ 826am called pt & had to leave a msg.

## 2018-01-12 ENCOUNTER — Ambulatory Visit (INDEPENDENT_AMBULATORY_CARE_PROVIDER_SITE_OTHER): Payer: Medicare Other | Admitting: Orthopaedic Surgery

## 2018-01-12 ENCOUNTER — Ambulatory Visit (INDEPENDENT_AMBULATORY_CARE_PROVIDER_SITE_OTHER): Payer: Self-pay

## 2018-01-12 ENCOUNTER — Encounter (INDEPENDENT_AMBULATORY_CARE_PROVIDER_SITE_OTHER): Payer: Self-pay | Admitting: Orthopaedic Surgery

## 2018-01-12 VITALS — BP 111/61 | HR 54 | Resp 14 | Ht 76.0 in | Wt 170.0 lb

## 2018-01-12 DIAGNOSIS — M1712 Unilateral primary osteoarthritis, left knee: Secondary | ICD-10-CM | POA: Diagnosis not present

## 2018-01-12 DIAGNOSIS — M1711 Unilateral primary osteoarthritis, right knee: Secondary | ICD-10-CM

## 2018-01-12 MED ORDER — METHYLPREDNISOLONE ACETATE 40 MG/ML IJ SUSP
80.0000 mg | INTRAMUSCULAR | Status: AC | PRN
  Administered 2018-01-12: 80 mg

## 2018-01-12 MED ORDER — BUPIVACAINE HCL 0.5 % IJ SOLN
2.0000 mL | INTRAMUSCULAR | Status: AC | PRN
Start: 2018-01-12 — End: 2018-01-12
  Administered 2018-01-12: 2 mL via INTRA_ARTICULAR

## 2018-01-12 MED ORDER — METHYLPREDNISOLONE ACETATE 40 MG/ML IJ SUSP
40.0000 mg | INTRAMUSCULAR | Status: AC | PRN
  Administered 2018-01-12: 40 mg via INTRA_ARTICULAR

## 2018-01-12 MED ORDER — LIDOCAINE HCL 1 % IJ SOLN
2.0000 mL | INTRAMUSCULAR | Status: AC | PRN
  Administered 2018-01-12: 2 mL

## 2018-01-12 MED ORDER — BUPIVACAINE HCL 0.5 % IJ SOLN
2.0000 mL | INTRAMUSCULAR | Status: AC | PRN
  Administered 2018-01-12: 2 mL via INTRA_ARTICULAR

## 2018-01-12 MED ORDER — METHYLPREDNISOLONE ACETATE 40 MG/ML IJ SUSP
80.0000 mg | INTRAMUSCULAR | Status: AC | PRN
Start: 2018-01-12 — End: 2018-01-12
  Administered 2018-01-12: 80 mg

## 2018-01-12 NOTE — Progress Notes (Signed)
Office Visit Note   Patient: [name] [name]           Date of Birth: 12/31/1930           MRN: [id number] Visit Date: 01/12/2018              Requested by: [name], MD [address] [place], [place] [address] PCP: [name], MD   Assessment & Plan: Visit Diagnoses:  1. Unilateral primary osteoarthritis, left knee   2. Unilateral primary osteoarthritis, right knee     Plan:  #1: Corticosteroid injections to both knees using only 60mg  Depo-Medrol per knee #2: Precertified Visco supplementation  Follow-Up Instructions: Return if symptoms worsen or fail to improve.   Orders:  Orders Placed This Encounter  Procedures  . XR KNEE 3 VIEW RIGHT  . XR KNEE 3 VIEW LEFT   No orders of the defined types were placed in this encounter.     Procedures: Large Joint Inj: bilateral knee on 01/12/2018 12:26 PM Indications: diagnostic evaluation Details: 25 G 1.5 in needle, anteromedial approach  Arthrogram: No  Medications (Right): 2 mL lidocaine 1 %; 2 mL bupivacaine 0.5 %; 80 mg methylPREDNISolone acetate 40 MG/ML; 40 mg methylPREDNISolone acetate 40 MG/ML Medications (Left): 2 mL lidocaine 1 %; 2 mL bupivacaine 0.5 %; 80 mg methylPREDNISolone acetate 40 MG/ML Consent was given by the patient. Immediately prior to procedure a time out was called to verify the correct patient, procedure, equipment, support staff and site/side marked as required. Patient was prepped and draped in the usual sterile fashion.       Clinical Data: No additional findings.   Subjective: Chief Complaint  Patient presents with  . Left Knee - Pain, Edema, Weakness  . Right Knee - Pain, Edema, Weakness    [name] [name] is an 84 y o here today for bilateral knee pain, R>L.  Pt has weakness that has led to multiple falls and now uses a walker, not cane. Started summer of 2018 and has progressed to falling.    HPI  [name] [name] is an 85 year old white male who is seen today for  bilateral knee pain more on the right than on left.  Had multiple falls and weakness and actually uses a walker this all started more around the summer 2018 but continues to be progressive with his weakness.  He apparently was admitted to the hospital for failure and was then discharged after a prolonged rehab visit.  He comes in today complaining of pain in both knees.  He has had corticosteroid injections previously as well as Visco supplementation according to him.  He states that he would like to consider cortisone injections as well as Visco supplementation.  Review of Systems  Constitutional: Negative for fatigue.  HENT: Negative for hearing loss.   Respiratory: Negative for apnea, chest tightness and shortness of breath.   Cardiovascular: Positive for leg swelling. Negative for chest pain and palpitations.  Gastrointestinal: Positive for constipation. Negative for blood in stool and diarrhea.  Genitourinary: Negative for difficulty urinating.  Musculoskeletal: Positive for arthralgias. Negative for back pain, joint swelling, myalgias, neck pain and neck stiffness.  Neurological: Positive for weakness. Negative for numbness and headaches.  Hematological: Does not bruise/bleed easily.  Psychiatric/Behavioral: Positive for sleep disturbance. The patient is not nervous/anxious.      Objective: Vital Signs: BP 111/61   Pulse (!) 54   Resp 14   Ht 6\' 4"  (1.93 m)  Wt 170 lb (77.1 kg)   BMI 20.69 kg/m   Physical Exam  Constitutional: He is oriented to person, place, and time. He appears well-developed and well-nourished.  HENT:  Head: Normocephalic and atraumatic.  Eyes: EOM are normal. Pupils are equal, round, and reactive to light.  Pulmonary/Chest: Effort normal.  Neurological: He is alert and oriented to person, place, and time.  Skin: Skin is warm and dry.  Psychiatric: He has a normal mood and affect. His behavior is normal. Judgment and thought content normal.    Ortho Exam   Today was range of motion he lacks around degrees of full extension he flexes to about 105 degrees.  Evidence with range of motion.  Trace effusions bilaterally.  He does open with varus stressing.  Has a good endpoint though.  Specialty Comments:  No specialty comments available.  Imaging: Xr Knee 3 View Left  Result Date: 01/12/2018 Three-view x-ray of the left knee reveals tricompartment OA.  He has bone-on-bone compartment with sclerosing both distal  medial femoral condyle and and lateral proximal tibial plateau  Xr Knee 3 View Right  Result Date: 01/12/2018 hree-view x-ray of the right knee reveals tricompartment OA.  He has bone-on-bone compartment with sclerosing both distal  medial femoral condyle and and lateral proximal tibial plateau.  Periarticular spurring noted.     PMFS History: Patient Active Problem List   Diagnosis Date Noted  . Chronic renal insufficiency 10/06/2017  . Spondylosis without myelopathy or radiculopathy, lumbar region 09/16/2017  . Hypokalemia 04/30/2017  . Leg wound, left, sequela 04/30/2017  . Unstageable pressure ulcer of sacral region ([place]) 04/30/2017  . Fall   . Pressure injury of skin 04/29/2017  . Acute on chronic diastolic (congestive) heart failure ([place])   . PAH (pulmonary artery hypertension) ([place])   . Right-sided heart failure ([place]) 04/26/2017  . Chronic diastolic heart failure ([place]) 04/21/2017  . Pulmonary hypertension, primary ([place]) 04/21/2017  . Severe tricuspid regurgitation 03/12/2017  . Bilateral lower extremity edema 02/25/2017  . Anticoagulated 02/25/2017  . Varicose veins of right lower extremity with complications 59/16/3846  . Obstructive lung disease (generalized) ([place]) 09/26/2016  . Carpal tunnel syndrome 06/25/2016  . Hereditary and idiopathic peripheral neuropathy 06/25/2016  . Paresthesia 05/27/2016  . Abnormality of gait 05/27/2016  . Chest x-ray abnormality 01/09/2016  . Paroxysmal atrial flutter ([place]) 12/05/2015   . Constipation 06/13/2015  . Encounter for therapeutic drug monitoring 05/25/2014  . Syncope 04/03/2014  . Rhabdomyolysis 04/03/2014  . UTI (urinary tract infection) 04/03/2014  . Acute renal failure ([place]) 04/03/2014  . Leukocytosis 04/03/2014  . Internal nasal lesion 05/15/2013  . Low back pain 04/19/2013  . Melanoma ([place]) 09/30/2012  . Cough 03/26/2012  . Hx of mitral valve repair 11/19/2011  . Long term (current) use of anticoagulants 11/03/2011  . Decubitus skin ulcer 10/28/2011  . Pleural effusion due to congestive heart failure ([place]) 10/28/2011  . Atrial fibrillation ([place]) 10/07/2011  . S/P mitral valve repair 10/01/2011  . Valvular heart disease 08/21/2011  . Hearing loss 04/09/2011  . THROMBOCYTOPENIA 09/19/2010  . ADENOCARCINOMA, PROSTATE 09/17/2010  . CALLUS, LEFT FOOT 09/17/2010  . BACK PAIN, CHRONIC 09/17/2010  . TINEA PEDIS 05/28/2009  . DERMATITIS, ATOPIC 04/10/2009  . HYPERCHOLESTEROLEMIA 06/09/2008  . DEPRESSION 06/09/2008  . HYPERTENSION, BENIGN ESSENTIAL 06/09/2008  . GERD 06/09/2008  . OSTEOARTHRITIS, GENERALIZED, MULTIPLE JOINTS 06/09/2008  . MUSCLE SPASM, BACK 06/09/2008  . PERSONAL HISTORY MALIGNANT NEOPLASM PROSTATE 06/09/2008  . PERSONAL HISTORY OF MALIGNANT MELANOMA OF  SKIN 06/09/2008  . ARRHYTHMIA, HX OF 06/09/2008  . Personal history of colonic polyps 06/09/2008   Past Medical History:  Diagnosis Date  . Abnormality of gait 05/27/2016  . Adenomatous polyps   . Carpal tunnel syndrome 06/25/2016   Right  . Depressive disorder, not elsewhere classified   . First degree AV block    Holter 3/18: NSR, PACs, PVCs, no AFib, no pauses.  Marland Kitchen Hereditary and idiopathic peripheral neuropathy 06/25/2016  . Hypertension   . Internal nasal lesion 05/15/2013  . Melanoma ([place])    Left Shoulder  . Mitral regurgitation   . MVP (mitral valve prolapse)    a. With severe MR s/p Complex valvuloplasty including artificial Gore-tex neochord placement x4, chordal  transposition x1, chordal release x1, # 32 mm Sorin Memo 3D Ring Annuloplasty 2012. // b. Echo 2/18: mild LVH, EF 50-55, mild AI, MV repair with mild MR, mod LAE, mod RVE, severe RAE, severe TR  . Neuropathy   . Normal coronary arteries    a. Normal coronary anatomy by cath 2012.  . Osteoarthritis    Knees  . PAF (paroxysmal atrial fibrillation) ([place])    a. Post-op MVR 2012.  Marland Kitchen Personal history of colonic polyps   . Prostate cancer ([place])   . Pulmonary HTN ([place])    a. Mild-mod by cath 2012.  . Pure hypercholesterolemia   . PVC (premature ventricular contraction)   . Thrombocytopenia ([place])   . Vision abnormalities    Cornea scarring    Family History  Problem Relation Age of Onset  . Clotting disorder Brother        CVA's  . Arthritis Mother   . Hypertension Mother   . Stroke Mother   . Hypertension Father   . Psychosis Father        psychiatric care  . Colon cancer Neg Hx   . Stomach cancer Neg Hx   . Heart attack Neg Hx     Past Surgical History:  Procedure Laterality Date  . CARDIAC CATHETERIZATION  09/2011   Pre-op for MVR -- normal coronaries.  Marland Kitchen CARDIOVERSION N/A 01/02/2016   Procedure: CARDIOVERSION;  Surgeon: Thayer Headings, MD;  Location: [facility] ENDOSCOPY;  Service: Cardiovascular;  Laterality: N/A;  . COLONOSCOPY W/ POLYPECTOMY    . INGUINAL HERNIA REPAIR  09/2009   Left  . KNEE ARTHROSCOPY      left x3  and right x2  . Melanoma Surgery     2001, 2005, 2006, 2009  . MITRAL VALVE REPAIR  10/01/2011   complex valvuloplasty with Goretex cord replacement and chordal transposition 69mm Sorin Memo 3D ring annuloplasty  . Nuclear Stress Test  09/2006   EF-64%, Normal  . PROSTATECTOMY  1993  . RIGHT HEART CATH N/A 04/29/2017   Procedure: Right Heart Cath;  Surgeon: Jolaine Artist, MD;  Location: [facility] CV LAB;  Service: Cardiovascular;  Laterality: N/A;  . ROOT CANAL  08-19-12  . ROTATOR CUFF REPAIR  2003   left  . TEE WITHOUT CARDIOVERSION  09/26/2011    Procedure: TRANSESOPHAGEAL ECHOCARDIOGRAM (TEE);  Surgeon: Lelon Perla, MD;  Location: [facility] ENDOSCOPY;  Service: Cardiovascular;  Laterality: N/A;  . US ECHOCARDIOGRAPHY  09/2009, 08/1011   mild LVH,mild AI,MVP with mild MR, mild-mod. TR with mild Pulm. HTN, EF-55-60%   Social History   Occupational History  . Not on file  Tobacco Use  . Smoking status: Never Smoker  . Smokeless tobacco: Never Used  Substance and Sexual Activity  .  Alcohol use: No    Alcohol/week: 0.0 oz    Comment: Last drink in 2000  . Drug use: No  . Sexual activity: Not on file

## 2018-01-25 ENCOUNTER — Other Ambulatory Visit (INDEPENDENT_AMBULATORY_CARE_PROVIDER_SITE_OTHER): Payer: Medicare Other

## 2018-01-25 DIAGNOSIS — D509 Iron deficiency anemia, unspecified: Secondary | ICD-10-CM

## 2018-01-25 LAB — CBC WITH DIFFERENTIAL/PLATELET
BASOS PCT: 0.4 % (ref 0.0–3.0)
Basophils Absolute: 0 10*3/uL (ref 0.0–0.1)
Eosinophils Absolute: 0 10*3/uL (ref 0.0–0.7)
Eosinophils Relative: 0.2 % (ref 0.0–5.0)
HEMATOCRIT: 38.3 % — AB (ref 39.0–52.0)
Hemoglobin: 12.6 g/dL — ABNORMAL LOW (ref 13.0–17.0)
LYMPHS ABS: 1 10*3/uL (ref 0.7–4.0)
LYMPHS PCT: 14.7 % (ref 12.0–46.0)
MCHC: 33 g/dL (ref 30.0–36.0)
MCV: 86 fl (ref 78.0–100.0)
MONOS PCT: 14.6 % — AB (ref 3.0–12.0)
Monocytes Absolute: 1 10*3/uL (ref 0.1–1.0)
NEUTROS ABS: 4.6 10*3/uL (ref 1.4–7.7)
Neutrophils Relative %: 70.1 % (ref 43.0–77.0)
PLATELETS: 66 10*3/uL — AB (ref 150.0–400.0)
RBC: 4.45 Mil/uL (ref 4.22–5.81)
RDW: 17.4 % — AB (ref 11.5–15.5)
WBC: 6.6 10*3/uL (ref 4.0–10.5)

## 2018-01-25 LAB — IRON: Iron: 34 ug/dL — ABNORMAL LOW (ref 42–165)

## 2018-01-26 ENCOUNTER — Telehealth: Payer: Self-pay | Admitting: Rheumatology

## 2018-01-26 LAB — FERRITIN: Ferritin: 29 ng/mL (ref 22.0–322.0)

## 2018-01-26 NOTE — Telephone Encounter (Signed)
Calling  to schedule delivery of Eufleexa for the patient.

## 2018-01-27 ENCOUNTER — Telehealth (INDEPENDENT_AMBULATORY_CARE_PROVIDER_SITE_OTHER): Payer: Self-pay | Admitting: Orthopaedic Surgery

## 2018-01-27 NOTE — Telephone Encounter (Signed)
Larkin Ina from [facility] called to set up delivery of patient's Euflexxa.  Please call [phone]

## 2018-01-29 NOTE — Telephone Encounter (Signed)
Sharron working on.

## 2018-01-31 NOTE — Progress Notes (Signed)
[place] at [facility] [address] [place], [place] [address] [phone] [id number]  Date:  02/01/2018   Name:  [name] [name]   DOB:  [birth year]   MRN:  [id number]  PCP:  [name], MD    Chief Complaint: Constipation (Pt states that he is still constipated. )   History of Present Illness:  [name] [name] is a 84 y.o. very pleasant male patient who presents with the following:  Pt of Dr. Charlett Blake here today with concern of constipation.  I saw him about a year ago for the same:  Here today with constipation- perhaps related to new medication Discussed strategies to resolve this issue At this time he is still passing soft stool, does not have any pain or straining  I suspect that your constipation may be related to your new Myrbetriq medication- however this does not mean that you cannot continue to take it since it is helping you For the time being use miralax; 1-2 doses a DAY until you are passing stool normally.  Please alert me if not success by Monday  Once this occurs you can use the miralax just as needed. Certainly some people do use this every day long term and it is safe to do so If you start to develop any abdominal pain or vomiting, or other concerns please let us know.    He notes that his constipation has been present for the last several months He thinks that he came in to talk to [place] about this in February but I do not see a note- ?might he have been seen at a different office not in our system?   He is using 1 cap of miralax per day right now, and 3 of his OTC colace per day.  This does help him have stools every few days, but he does not feel like he is moving his bowels enough   He notes that he has had constipation off and on for some time- several years  No abd pain No vomiting He is having a stool maybe every 3 days, but smaller than he would expect  He may have a little bit of stool in between times but not  much He is still eating well   His weight is stable at home  One of the people who helps him out at home suggested that linzess might be helpful for him- this would be a reasonable thing to try  BP Readings from Last 3 Encounters:  02/01/18 102/74  01/12/18 111/61  12/01/17 102/70   Pulse Readings from Last 3 Encounters:  02/01/18 (!) 56  01/12/18 (!) 54  12/01/17 (!) 55   He also notes that unless he lays on his back with his head elevated, he cannot breathe through his nose at night.   He wonders if there is anything that might improve this for him   Wt Readings from Last 3 Encounters:  02/01/18 167 lb (75.8 kg)  01/12/18 170 lb (77.1 kg)  12/01/17 172 lb (78 kg)      Patient Active Problem List   Diagnosis Date Noted  . Chronic renal insufficiency 10/06/2017  . Spondylosis without myelopathy or radiculopathy, lumbar region 09/16/2017  . Hypokalemia 04/30/2017  . Leg wound, left, sequela 04/30/2017  . Unstageable pressure ulcer of sacral region ([place]) 04/30/2017  . Fall   . Pressure injury of skin 04/29/2017  . Acute on chronic diastolic (congestive) heart  failure ([place])   . PAH (pulmonary artery hypertension) ([place])   . Right-sided heart failure ([place]) 04/26/2017  . Chronic diastolic heart failure ([place]) 04/21/2017  . Pulmonary hypertension, primary ([place]) 04/21/2017  . Severe tricuspid regurgitation 03/12/2017  . Bilateral lower extremity edema 02/25/2017  . Anticoagulated 02/25/2017  . Varicose veins of right lower extremity with complications 73/41/9379  . Obstructive lung disease (generalized) ([place]) 09/26/2016  . Carpal tunnel syndrome 06/25/2016  . Hereditary and idiopathic peripheral neuropathy 06/25/2016  . Paresthesia 05/27/2016  . Abnormality of gait 05/27/2016  . Chest x-ray abnormality 01/09/2016  . Paroxysmal atrial flutter ([place]) 12/05/2015  . Constipation 06/13/2015  . Encounter for therapeutic drug monitoring 05/25/2014  . Syncope 04/03/2014  .  Rhabdomyolysis 04/03/2014  . UTI (urinary tract infection) 04/03/2014  . Acute renal failure ([place]) 04/03/2014  . Leukocytosis 04/03/2014  . Internal nasal lesion 05/15/2013  . Low back pain 04/19/2013  . Melanoma ([place]) 09/30/2012  . Cough 03/26/2012  . Hx of mitral valve repair 11/19/2011  . Long term (current) use of anticoagulants 11/03/2011  . Decubitus skin ulcer 10/28/2011  . Pleural effusion due to congestive heart failure ([place]) 10/28/2011  . Atrial fibrillation ([place]) 10/07/2011  . S/P mitral valve repair 10/01/2011  . Valvular heart disease 08/21/2011  . Hearing loss 04/09/2011  . THROMBOCYTOPENIA 09/19/2010  . ADENOCARCINOMA, PROSTATE 09/17/2010  . CALLUS, LEFT FOOT 09/17/2010  . BACK PAIN, CHRONIC 09/17/2010  . TINEA PEDIS 05/28/2009  . DERMATITIS, ATOPIC 04/10/2009  . HYPERCHOLESTEROLEMIA 06/09/2008  . DEPRESSION 06/09/2008  . HYPERTENSION, BENIGN ESSENTIAL 06/09/2008  . GERD 06/09/2008  . OSTEOARTHRITIS, GENERALIZED, MULTIPLE JOINTS 06/09/2008  . MUSCLE SPASM, BACK 06/09/2008  . PERSONAL HISTORY MALIGNANT NEOPLASM PROSTATE 06/09/2008  . PERSONAL HISTORY OF MALIGNANT MELANOMA OF SKIN 06/09/2008  . ARRHYTHMIA, HX OF 06/09/2008  . Personal history of colonic polyps 06/09/2008    Past Medical History:  Diagnosis Date  . Abnormality of gait 05/27/2016  . Adenomatous polyps   . Carpal tunnel syndrome 06/25/2016   Right  . Depressive disorder, not elsewhere classified   . First degree AV block    Holter 3/18: NSR, PACs, PVCs, no AFib, no pauses.  Marland Kitchen Hereditary and idiopathic peripheral neuropathy 06/25/2016  . Hypertension   . Internal nasal lesion 05/15/2013  . Melanoma ([place])    Left Shoulder  . Mitral regurgitation   . MVP (mitral valve prolapse)    a. With severe MR s/p Complex valvuloplasty including artificial Gore-tex neochord placement x4, chordal transposition x1, chordal release x1, # 32 mm Sorin Memo 3D Ring Annuloplasty 2012. // b. Echo 2/18: mild LVH, EF  50-55, mild AI, MV repair with mild MR, mod LAE, mod RVE, severe RAE, severe TR  . Neuropathy   . Normal coronary arteries    a. Normal coronary anatomy by cath 2012.  . Osteoarthritis    Knees  . PAF (paroxysmal atrial fibrillation) ([place])    a. Post-op MVR 2012.  Marland Kitchen Personal history of colonic polyps   . Prostate cancer ([place])   . Pulmonary HTN ([place])    a. Mild-mod by cath 2012.  . Pure hypercholesterolemia   . PVC (premature ventricular contraction)   . Thrombocytopenia ([place])   . Vision abnormalities    Cornea scarring    Past Surgical History:  Procedure Laterality Date  . CARDIAC CATHETERIZATION  09/2011   Pre-op for MVR -- normal coronaries.  Marland Kitchen CARDIOVERSION N/A 01/02/2016   Procedure: CARDIOVERSION;  Surgeon: Thayer Headings, MD;  Location:  [facility] ENDOSCOPY;  Service: Cardiovascular;  Laterality: N/A;  . COLONOSCOPY W/ POLYPECTOMY    . INGUINAL HERNIA REPAIR  09/2009   Left  . KNEE ARTHROSCOPY      left x3  and right x2  . Melanoma Surgery     2001, 2005, 2006, 2009  . MITRAL VALVE REPAIR  10/01/2011   complex valvuloplasty with Goretex cord replacement and chordal transposition 60mm Sorin Memo 3D ring annuloplasty  . Nuclear Stress Test  09/2006   EF-64%, Normal  . PROSTATECTOMY  1993  . RIGHT HEART CATH N/A 04/29/2017   Procedure: Right Heart Cath;  Surgeon: Jolaine Artist, MD;  Location: [facility] CV LAB;  Service: Cardiovascular;  Laterality: N/A;  . ROOT CANAL  08-19-12  . ROTATOR CUFF REPAIR  2003   left  . TEE WITHOUT CARDIOVERSION  09/26/2011   Procedure: TRANSESOPHAGEAL ECHOCARDIOGRAM (TEE);  Surgeon: Lelon Perla, MD;  Location: [facility] ENDOSCOPY;  Service: Cardiovascular;  Laterality: N/A;  . US ECHOCARDIOGRAPHY  09/2009, 08/1011   mild LVH,mild AI,MVP with mild MR, mild-mod. TR with mild Pulm. HTN, EF-55-60%    Social History   Tobacco Use  . Smoking status: Never Smoker  . Smokeless tobacco: Never Used  Substance Use Topics  . Alcohol use: No     Alcohol/week: 0.0 oz    Comment: Last drink in 2000  . Drug use: No    Family History  Problem Relation Age of Onset  . Clotting disorder Brother        CVA's  . Arthritis Mother   . Hypertension Mother   . Stroke Mother   . Hypertension Father   . Psychosis Father        psychiatric care  . Colon cancer Neg Hx   . Stomach cancer Neg Hx   . Heart attack Neg Hx     No Known Allergies  Medication list has been reviewed and updated.  Current Outpatient Medications on File Prior to Visit  Medication Sig Dispense Refill  . amiodarone (PACERONE) 200 MG tablet Take 1 tablet (200 mg total) by mouth daily. 30 tablet 11  . b complex vitamins tablet Take 1 tablet by mouth daily.    . cycloSPORINE (RESTASIS) 0.05 % ophthalmic emulsion Place 1 drop into both eyes 2 (two) times daily.    Marland Kitchen diltiazem (CARTIA XT) 300 MG 24 hr capsule Take 1 capsule (300 mg total) by mouth daily. 90 capsule 1  . ferrous sulfate 325 (65 FE) MG tablet Take 1 tablet (325 mg total) 2 (two) times daily with a meal by mouth.  3  . gabapentin (NEURONTIN) 300 MG capsule Take 1 capsule (300 mg total) by mouth 2 (two) times daily. 180 capsule 3  . glucosamine-chondroitin 500-400 MG tablet Take 1 tablet by mouth 2 (two) times daily.    Marland Kitchen ketoconazole (NIZORAL) 2 % cream Apply 1 application topically daily. 30 g 0  . metoprolol succinate (TOPROL-XL) 50 MG 24 hr tablet TAKE 1 TABLET DAILY, TAKE WITH OR IMMEDIATELY FOLLOWING A MEAL 90 tablet 3  . Multiple Vitamin (MULTIVITAMIN WITH MINERALS) TABS tablet Take 1 tablet by mouth every evening.     Marland Kitchen MYRBETRIQ 50 MG TB24 tablet Take 1 tablet by mouth daily.    . potassium chloride SA (K-DUR,KLOR-CON) 20 MEQ tablet Take 2 tablets (40 mEq total) by mouth daily. 180 tablet 3  . potassium chloride SA (K-DUR,KLOR-CON) 20 MEQ tablet Take 2 tablets (40 mEq total) by mouth daily. 180 tablet  2  . rosuvastatin (CRESTOR) 5 MG tablet TAKE 1 TABLET DAILY 90 tablet 3  . tolnaftate  (ANTIFUNGAL SPRAY POWDER) 1 % spray Apply topically 2 (two) times daily. 130 g 0  . torsemide (DEMADEX) 20 MG tablet Take 2 tablets (40 mg total) by mouth daily. Take extra 20 mg tablet once daily as needed for 3 lbs or more weight gain. 180 tablet 3  . traZODone (DESYREL) 100 MG tablet Take 300 mg by mouth at bedtime.     . vitamin C (ASCORBIC ACID) 500 MG tablet Take 500 mg by mouth 2 (two) times daily.    Marland Kitchen warfarin (COUMADIN) 5 MG tablet TAKE 1 TABLET DAILY AS DIRECTED BY THE COUMADIN CLINIC 30 tablet 3  . Wheat Dextrin (BENEFIBER) POWD Take by mouth. Mix with Miralax one to two times daily as needed for constipation.    . [DISCONTINUED] pantoprazole (PROTONIX) 40 MG tablet Take 1 tablet (40 mg total) by mouth daily before breakfast.     No current facility-administered medications on file prior to visit.     Review of Systems:  As per HPI- otherwise negative.   Physical Examination: Vitals:   02/01/18 1209  BP: 102/74  Pulse: (!) 56  Temp: (!) 97.4 F (36.3 C)  SpO2: 94%   Vitals:   02/01/18 1209  Weight: 167 lb (75.8 kg)  Height: 6\' 5"  (1.956 m)   Body mass index is 19.8 kg/m. Ideal Body Weight: Weight in (lb) to have BMI = 25: 210.4  GEN: WDWN, NAD, Non-toxic, A & O x 3, looks well  HEENT: Atraumatic, Normocephalic. Neck supple. No masses, No LAD. Bilateral TM wnl, oropharynx normal.  PEERL,EOMI.  He has a ?polyp vs very prominent turbinate in left nostril Ears and Nose: No external deformity. CV: RRR, No M/G/R. No JVD. No thrill. No extra heart sounds. PULM: CTA B, no wheezes, crackles, rhonchi. No retractions. No resp. distress. No accessory muscle use. ABD: S, NT, ND, +BS. No rebound. No HSM. He has a slightly full belly c/w constipation, but it is soft and non- tender EXTR: No c/c/e NEURO sitting in WC today.   PSYCH: Normally interactive. Conversant. Not depressed or anxious appearing.  Calm demeanor.    Assessment and Plan: Chronic constipation - Plan:  linaclotide (LINZESS) 145 MCG CAPS capsule, DG Abd 2 Views  Nasal congestion - Plan: Ambulatory referral to ENT  Here today with constipation.  Will obtain films to ensure not suggestion of obstruction Assuming ok will have him step up miralax to 2-3 doses per day until he passes some good stools. Then can start using linzess as well  Referral to ENT   Signed Lamar Blinks, MD  Called him and went over his films from today Dg Abd 2 Views  Result Date: 02/01/2018 CLINICAL DATA:  Constipation for year EXAM: ABDOMEN - 2 VIEW COMPARISON:  None. FINDINGS: Large amount of stool throughout the colon. There is no bowel dilatation to suggest obstruction. There is no evidence of pneumoperitoneum, portal venous gas or pneumatosis. There are no pathologic calcifications along the expected course of the ureters. Dextroscoliosis of the thoracolumbar spine. IMPRESSION: Large amount of stool throughout the colon. Electronically Signed   By: Kathreen Devoid   On: 02/01/2018 12:58   Xr Knee 3 View Left  Result Date: 01/12/2018 Three-view x-ray of the left knee reveals tricompartment OA.  He has bone-on-bone compartment with sclerosing both distal  medial femoral condyle and and lateral proximal tibial plateau  Xr Knee  3 View Right  Result Date: 01/12/2018 hree-view x-ray of the right knee reveals tricompartment OA.  He has bone-on-bone compartment with sclerosing both distal  medial femoral condyle and and lateral proximal tibial plateau.  Periarticular spurring noted.   No sign of obstruction He will let me know if not successful with miralax

## 2018-02-01 ENCOUNTER — Encounter: Payer: Self-pay | Admitting: Family Medicine

## 2018-02-01 ENCOUNTER — Ambulatory Visit (INDEPENDENT_AMBULATORY_CARE_PROVIDER_SITE_OTHER): Payer: Medicare Other | Admitting: Family Medicine

## 2018-02-01 ENCOUNTER — Ambulatory Visit (HOSPITAL_BASED_OUTPATIENT_CLINIC_OR_DEPARTMENT_OTHER)
Admission: RE | Admit: 2018-02-01 | Discharge: 2018-02-01 | Disposition: A | Discharge status: Home / Self Care | Payer: Medicare Other | Source: Ambulatory Visit | Attending: Family Medicine | Admitting: Family Medicine

## 2018-02-01 VITALS — BP 102/74 | HR 56 | Temp 97.4°F | Ht 77.0 in | Wt 167.0 lb

## 2018-02-01 DIAGNOSIS — R0981 Nasal congestion: Secondary | ICD-10-CM

## 2018-02-01 DIAGNOSIS — K5909 Other constipation: Secondary | ICD-10-CM | POA: Insufficient documentation

## 2018-02-01 MED ORDER — LINACLOTIDE 145 MCG PO CAPS
145.0000 ug | ORAL_CAPSULE | Freq: Every day | ORAL | 6 refills | Status: DC
Start: 2018-02-01 — End: 2018-04-05

## 2018-02-01 NOTE — Patient Instructions (Addendum)
Lets try Linzess for your constipation symptoms, assuming your x-rays do not show any obstruction Please have abdominal x-rays done today, then you can go on home and I will call with your report  Let's have you increase your miralax to 2-3 doses a day until results. Once you have some of this stool out, start on the linzess  Please let me know if you are not getting better, right away if getting worse I will refer you to ENT to take a look inside your nose

## 2018-02-08 ENCOUNTER — Telehealth (INDEPENDENT_AMBULATORY_CARE_PROVIDER_SITE_OTHER): Payer: Self-pay | Admitting: Orthopaedic Surgery

## 2018-02-08 NOTE — Telephone Encounter (Signed)
Euflexxa delivered to office 02/03/18,  bil, patient purchased, please schedule appts w/Brian x 3. Thank you.

## 2018-02-08 NOTE — Telephone Encounter (Signed)
PLEASE SCHEDULE APPT

## 2018-02-08 NOTE — Telephone Encounter (Signed)
Delivered to office 02/03/18 bil patient purchased

## 2018-02-08 NOTE — Telephone Encounter (Signed)
Delivered to office bil, patient purchased, 02/03/18.

## 2018-02-08 NOTE — Telephone Encounter (Signed)
Patient left a voicemail regarding his Euflexxa injections.  Patient states he is still waiting to receive a call from our office to schedule an appointment to receive the injections.

## 2018-02-11 ENCOUNTER — Ambulatory Visit (INDEPENDENT_AMBULATORY_CARE_PROVIDER_SITE_OTHER): Payer: Medicare Other | Admitting: *Deleted

## 2018-02-11 DIAGNOSIS — Z9889 Other specified postprocedural states: Secondary | ICD-10-CM

## 2018-02-11 DIAGNOSIS — I4891 Unspecified atrial fibrillation: Secondary | ICD-10-CM | POA: Diagnosis not present

## 2018-02-11 DIAGNOSIS — I4892 Unspecified atrial flutter: Secondary | ICD-10-CM | POA: Diagnosis not present

## 2018-02-11 DIAGNOSIS — Z5181 Encounter for therapeutic drug level monitoring: Secondary | ICD-10-CM

## 2018-02-11 LAB — POCT INR: INR: 1.9

## 2018-02-11 NOTE — Patient Instructions (Signed)
Description   Today March 28th take 1 tablet (5mg ) then continue taking same dosage 1/2 tablet daily. Recheck in 3 weeks. Call [phone] if scheduled for any procedures or on any new medications

## 2018-02-17 ENCOUNTER — Encounter (INDEPENDENT_AMBULATORY_CARE_PROVIDER_SITE_OTHER): Payer: Self-pay | Admitting: Orthopedic Surgery

## 2018-02-17 ENCOUNTER — Ambulatory Visit (INDEPENDENT_AMBULATORY_CARE_PROVIDER_SITE_OTHER): Payer: Medicare Other | Admitting: Orthopedic Surgery

## 2018-02-17 VITALS — BP 105/65 | HR 58 | Ht 76.0 in | Wt 170.0 lb

## 2018-02-17 DIAGNOSIS — M1711 Unilateral primary osteoarthritis, right knee: Secondary | ICD-10-CM | POA: Diagnosis not present

## 2018-02-17 DIAGNOSIS — M1712 Unilateral primary osteoarthritis, left knee: Secondary | ICD-10-CM

## 2018-02-17 MED ORDER — LIDOCAINE HCL 1 % IJ SOLN
2.0000 mL | INTRAMUSCULAR | Status: AC | PRN
  Administered 2018-02-17: 2 mL

## 2018-02-17 MED ORDER — SODIUM HYALURONATE (VISCOSUP) 20 MG/2ML IX SOSY
20.0000 mg | PREFILLED_SYRINGE | INTRA_ARTICULAR | Status: AC | PRN
  Administered 2018-02-17: 20 mg via INTRA_ARTICULAR

## 2018-02-17 NOTE — Progress Notes (Signed)
Office Visit Note   Patient: [name] [name]           Date of Birth: June 05, 1931           MRN: [id number] Visit Date: 02/17/2018              Requested by: [name], MD [address] [place],  [address] PCP: [name], MD   Assessment & Plan: Visit Diagnoses:  1. Unilateral primary osteoarthritis, left knee   2. Unilateral primary osteoarthritis, right knee     Plan: #1: Bilateral Euflexxa injections were given without difficulty. #2: Follow up in 1 week for the second of 3 Euflexxa injections to both knees.  Follow-Up Instructions: Return in about 1 week (around 02/24/2018).   Orders:  Orders Placed This Encounter  Procedures  . Large Joint Inj   No orders of the defined types were placed in this encounter.     Procedures: Large Joint Inj: bilateral knee on 02/17/2018 12:22 PM Indications: pain and joint swelling Details: 25 G 1.5 in needle, anteromedial approach  Arthrogram: No  Medications (Right): 2 mL lidocaine 1 %; 20 mg Sodium Hyaluronate 20 MG/2ML Medications (Left): 2 mL lidocaine 1 %; 20 mg Sodium Hyaluronate 20 MG/2ML Outcome: tolerated well, no immediate complications Procedure, treatment alternatives, risks and benefits explained, specific risks discussed. Consent was given by the patient. Immediately prior to procedure a time out was called to verify the correct patient, procedure, equipment, support staff and site/side marked as required. Patient was prepped and draped in the usual sterile fashion.       Clinical Data: No additional findings.   Subjective: Chief Complaint  Patient presents with  . Left Knee - Pain  . Right Knee - Pain    HPI  Review of Systems  Constitutional: Negative for fatigue.  HENT: Negative for hearing loss.   Respiratory: Negative for apnea, chest tightness and shortness of breath.   Cardiovascular: Positive for leg swelling. Negative for chest pain and palpitations.    Gastrointestinal: Positive for constipation. Negative for blood in stool and diarrhea.  Genitourinary: Negative for difficulty urinating.  Musculoskeletal: Positive for arthralgias. Negative for back pain, joint swelling, myalgias, neck pain and neck stiffness.  Neurological: Positive for weakness. Negative for numbness and headaches.  Hematological: Does not bruise/bleed easily.  Psychiatric/Behavioral: Positive for sleep disturbance. The patient is not nervous/anxious.      Objective: Vital Signs: BP 105/65   Pulse (!) 58   Ht 6\' 4"  (1.93 m)   Wt 170 lb (77.1 kg)   BMI 20.69 kg/m   Physical Exam  Constitutional: He is oriented to person, place, and time. He appears well-developed and well-nourished.  HENT:  Head: Normocephalic and atraumatic.  Eyes: Pupils are equal, round, and reactive to light. EOM are normal.  Pulmonary/Chest: Effort normal.  Neurological: He is alert and oriented to person, place, and time.  Skin: Skin is warm and dry.  Psychiatric: He has a normal mood and affect. His behavior is normal. Judgment and thought content normal.    Ortho Exam  Exam today reveals to 5 degrees shy of full extension with flexion to about 105 degrees bilaterally.  Trace effusions are noted bilaterally.  Does open with varus stressing.  Calf is supple nontender.  Neurovascular intact distally.  Specialty Comments:  No specialty comments available.  Imaging: No results found.   PMFS History: Patient Active Problem List   Diagnosis Date Noted  .  Chronic renal insufficiency 10/06/2017  . Spondylosis without myelopathy or radiculopathy, lumbar region 09/16/2017  . Hypokalemia 04/30/2017  . Leg wound, left, sequela 04/30/2017  . Unstageable pressure ulcer of sacral region ([place]) 04/30/2017  . Fall   . Pressure injury of skin 04/29/2017  . Acute on chronic diastolic (congestive) heart failure ([place])   . PAH (pulmonary artery hypertension) ([place])   . Right-sided heart failure  ([place]) 04/26/2017  . Chronic diastolic heart failure ([place]) 04/21/2017  . Pulmonary hypertension, primary ([place]) 04/21/2017  . Severe tricuspid regurgitation 03/12/2017  . Bilateral lower extremity edema 02/25/2017  . Anticoagulated 02/25/2017  . Varicose veins of right lower extremity with complications 98/92/1194  . Obstructive lung disease (generalized) ([place]) 09/26/2016  . Carpal tunnel syndrome 06/25/2016  . Hereditary and idiopathic peripheral neuropathy 06/25/2016  . Paresthesia 05/27/2016  . Abnormality of gait 05/27/2016  . Chest x-ray abnormality 01/09/2016  . Paroxysmal atrial flutter ([place]) 12/05/2015  . Constipation 06/13/2015  . Encounter for therapeutic drug monitoring 05/25/2014  . Syncope 04/03/2014  . Rhabdomyolysis 04/03/2014  . UTI (urinary tract infection) 04/03/2014  . Acute renal failure ([place]) 04/03/2014  . Leukocytosis 04/03/2014  . Internal nasal lesion 05/15/2013  . Low back pain 04/19/2013  . Melanoma ([place]) 09/30/2012  . Cough 03/26/2012  . Hx of mitral valve repair 11/19/2011  . Long term (current) use of anticoagulants 11/03/2011  . Decubitus skin ulcer 10/28/2011  . Pleural effusion due to congestive heart failure ([place]) 10/28/2011  . Atrial fibrillation ([place]) 10/07/2011  . S/P mitral valve repair 10/01/2011  . Valvular heart disease 08/21/2011  . Hearing loss 04/09/2011  . THROMBOCYTOPENIA 09/19/2010  . ADENOCARCINOMA, PROSTATE 09/17/2010  . CALLUS, LEFT FOOT 09/17/2010  . BACK PAIN, CHRONIC 09/17/2010  . TINEA PEDIS 05/28/2009  . DERMATITIS, ATOPIC 04/10/2009  . HYPERCHOLESTEROLEMIA 06/09/2008  . DEPRESSION 06/09/2008  . HYPERTENSION, BENIGN ESSENTIAL 06/09/2008  . GERD 06/09/2008  . OSTEOARTHRITIS, GENERALIZED, MULTIPLE JOINTS 06/09/2008  . MUSCLE SPASM, BACK 06/09/2008  . PERSONAL HISTORY MALIGNANT NEOPLASM PROSTATE 06/09/2008  . PERSONAL HISTORY OF MALIGNANT MELANOMA OF SKIN 06/09/2008  . ARRHYTHMIA, HX OF 06/09/2008  . Personal history of  colonic polyps 06/09/2008   Past Medical History:  Diagnosis Date  . Abnormality of gait 05/27/2016  . Adenomatous polyps   . Carpal tunnel syndrome 06/25/2016   Right  . Depressive disorder, not elsewhere classified   . First degree AV block    Holter 3/18: NSR, PACs, PVCs, no AFib, no pauses.  Marland Kitchen Hereditary and idiopathic peripheral neuropathy 06/25/2016  . Hypertension   . Internal nasal lesion 05/15/2013  . Melanoma ([place])    Left Shoulder  . Mitral regurgitation   . MVP (mitral valve prolapse)    a. With severe MR s/p Complex valvuloplasty including artificial Gore-tex neochord placement x4, chordal transposition x1, chordal release x1, # 32 mm Sorin Memo 3D Ring Annuloplasty 2012. // b. Echo 2/18: mild LVH, EF 50-55, mild AI, MV repair with mild MR, mod LAE, mod RVE, severe RAE, severe TR  . Neuropathy   . Normal coronary arteries    a. Normal coronary anatomy by cath 2012.  . Osteoarthritis    Knees  . PAF (paroxysmal atrial fibrillation) ([place])    a. Post-op MVR 2012.  Marland Kitchen Personal history of colonic polyps   . Prostate cancer ([place])   . Pulmonary HTN ([place])    a. Mild-mod by cath 2012.  . Pure hypercholesterolemia   . PVC (premature ventricular contraction)   .  Thrombocytopenia ([place])   . Vision abnormalities    Cornea scarring    Family History  Problem Relation Age of Onset  . Clotting disorder Brother        CVA's  . Arthritis Mother   . Hypertension Mother   . Stroke Mother   . Hypertension Father   . Psychosis Father        psychiatric care  . Colon cancer Neg Hx   . Stomach cancer Neg Hx   . Heart attack Neg Hx     Past Surgical History:  Procedure Laterality Date  . CARDIAC CATHETERIZATION  09/2011   Pre-op for MVR -- normal coronaries.  Marland Kitchen CARDIOVERSION N/A 01/02/2016   Procedure: CARDIOVERSION;  Surgeon: Thayer Headings, MD;  Location: [facility] ENDOSCOPY;  Service: Cardiovascular;  Laterality: N/A;  . COLONOSCOPY W/ POLYPECTOMY    . INGUINAL HERNIA REPAIR  09/2009    Left  . KNEE ARTHROSCOPY      left x3  and right x2  . Melanoma Surgery     2001, 2005, 2006, 2009  . MITRAL VALVE REPAIR  10/01/2011   complex valvuloplasty with Goretex cord replacement and chordal transposition 10mm Sorin Memo 3D ring annuloplasty  . Nuclear Stress Test  09/2006   EF-64%, Normal  . PROSTATECTOMY  1993  . RIGHT HEART CATH N/A 04/29/2017   Procedure: Right Heart Cath;  Surgeon: Jolaine Artist, MD;  Location: [facility] CV LAB;  Service: Cardiovascular;  Laterality: N/A;  . ROOT CANAL  08-19-12  . ROTATOR CUFF REPAIR  2003   left  . TEE WITHOUT CARDIOVERSION  09/26/2011   Procedure: TRANSESOPHAGEAL ECHOCARDIOGRAM (TEE);  Surgeon: Lelon Perla, MD;  Location: [facility] ENDOSCOPY;  Service: Cardiovascular;  Laterality: N/A;  . US ECHOCARDIOGRAPHY  09/2009, 08/1011   mild LVH,mild AI,MVP with mild MR, mild-mod. TR with mild Pulm. HTN, EF-55-60%   Social History   Occupational History  . Not on file  Tobacco Use  . Smoking status: Never Smoker  . Smokeless tobacco: Never Used  Substance and Sexual Activity  . Alcohol use: No    Alcohol/week: 0.0 oz    Comment: Last drink in 2000  . Drug use: No  . Sexual activity: Not on file

## 2018-02-24 ENCOUNTER — Ambulatory Visit (INDEPENDENT_AMBULATORY_CARE_PROVIDER_SITE_OTHER): Payer: Medicare Other | Admitting: Orthopedic Surgery

## 2018-02-24 DIAGNOSIS — M1711 Unilateral primary osteoarthritis, right knee: Secondary | ICD-10-CM

## 2018-02-24 DIAGNOSIS — M1712 Unilateral primary osteoarthritis, left knee: Secondary | ICD-10-CM

## 2018-02-24 MED ORDER — LIDOCAINE HCL 1 % IJ SOLN
2.0000 mL | INTRAMUSCULAR | Status: AC | PRN
  Administered 2018-02-24: 2 mL

## 2018-02-24 MED ORDER — SODIUM HYALURONATE (VISCOSUP) 20 MG/2ML IX SOSY
20.0000 mg | PREFILLED_SYRINGE | INTRA_ARTICULAR | Status: AC | PRN
  Administered 2018-02-24: 20 mg via INTRA_ARTICULAR

## 2018-02-24 NOTE — Progress Notes (Signed)
Office Visit Note   Patient: [name] [name]           Date of Birth: [birth year]           MRN: [id number] Visit Date: 02/24/2018              Requested by: [name], MD [address] [place], [place] [address] PCP: [name], MD   Assessment & Plan: Visit Diagnoses:  1. Unilateral primary osteoarthritis, left knee   2. Unilateral primary osteoarthritis, right knee     Plan:  #1: Euflexxa injection #2 was given to both knees.  Right knee was through the lateral joint.  Right with suprapatellar.  Tolerated procedure well. #2: Follow back up in 1 week for the third Euflexxa injection to bilateral knees.   Follow-Up Instructions: Return in about 1 week (around 03/03/2018).   Orders:  No orders of the defined types were placed in this encounter.  No orders of the defined types were placed in this encounter.     Procedures: Large Joint Inj: bilateral knee on 02/24/2018 11:49 AM Indications: pain and joint swelling Details: 25 G 1.5 in needle, anteromedial approach  Arthrogram: No  Medications (Right): 2 mL lidocaine 1 %; 20 mg Sodium Hyaluronate 20 MG/2ML Medications (Left): 2 mL lidocaine 1 %; 20 mg Sodium Hyaluronate 20 MG/2ML Outcome: tolerated well, no immediate complications Procedure, treatment alternatives, risks and benefits explained, specific risks discussed. Consent was given by the patient. Immediately prior to procedure a time out was called to verify the correct patient, procedure, equipment, support staff and site/side marked as required. Patient was prepped and draped in the usual sterile fashion.       Clinical Data: No additional findings.   Subjective: Chief Complaint  Patient presents with  . Left Knee - Pain, Follow-up  . Right Knee - Pain, Follow-up    HPI  [name] [name] returns today for his second Euflexxa injections bilaterally.  He states that for a day or 2 after the injection it was very uncomfortable however he  states that he feels as if it has improved.  Otherwise denies any reactivity.  Review of Systems  Constitutional: Negative for fatigue.  HENT: Negative for hearing loss.   Respiratory: Negative for apnea, chest tightness and shortness of breath.   Cardiovascular: Positive for leg swelling. Negative for chest pain and palpitations.  Gastrointestinal: Positive for constipation. Negative for blood in stool and diarrhea.  Genitourinary: Negative for difficulty urinating.  Musculoskeletal: Positive for arthralgias. Negative for back pain, joint swelling, myalgias, neck pain and neck stiffness.  Neurological: Positive for weakness. Negative for numbness and headaches.  Hematological: Does not bruise/bleed easily.  Psychiatric/Behavioral: Positive for sleep disturbance. The patient is not nervous/anxious.      Objective: Vital Signs: There were no vitals taken for this visit.  Physical Exam  Ortho Exam  Exam today does not reveal any signs of reactivity.  No warmth or erythema.  Does continue to have a mild effusion bilaterally.  Specialty Comments:  No specialty comments available.  Imaging: No results found.   PMFS History:  Current Outpatient Medications  Medication Sig Dispense Refill  . amiodarone (PACERONE) 200 MG tablet Take 1 tablet (200 mg total) by mouth daily. 30 tablet 11  . b complex vitamins tablet Take 1 tablet by mouth daily.    . cycloSPORINE (RESTASIS) 0.05 % ophthalmic emulsion Place 1 drop into both eyes 2 (two) times daily.    Marland Kitchen  diltiazem (CARTIA XT) 300 MG 24 hr capsule Take 1 capsule (300 mg total) by mouth daily. 90 capsule 1  . ferrous sulfate 325 (65 FE) MG tablet Take 1 tablet (325 mg total) 2 (two) times daily with a meal by mouth.  3  . gabapentin (NEURONTIN) 300 MG capsule Take 1 capsule (300 mg total) by mouth 2 (two) times daily. 180 capsule 3  . glucosamine-chondroitin 500-400 MG tablet Take 1 tablet by mouth 2 (two) times daily.    Marland Kitchen ketoconazole  (NIZORAL) 2 % cream Apply 1 application topically daily. 30 g 0  . linaclotide (LINZESS) 145 MCG CAPS capsule Take 1 capsule (145 mcg total) by mouth daily before breakfast. 30 capsule 6  . metoprolol succinate (TOPROL-XL) 50 MG 24 hr tablet TAKE 1 TABLET DAILY, TAKE WITH OR IMMEDIATELY FOLLOWING A MEAL 90 tablet 3  . Multiple Vitamin (MULTIVITAMIN WITH MINERALS) TABS tablet Take 1 tablet by mouth every evening.     Marland Kitchen MYRBETRIQ 50 MG TB24 tablet Take 1 tablet by mouth daily.    . potassium chloride SA (K-DUR,KLOR-CON) 20 MEQ tablet Take 2 tablets (40 mEq total) by mouth daily. 180 tablet 3  . potassium chloride SA (K-DUR,KLOR-CON) 20 MEQ tablet Take 2 tablets (40 mEq total) by mouth daily. 180 tablet 2  . rosuvastatin (CRESTOR) 5 MG tablet TAKE 1 TABLET DAILY 90 tablet 3  . tolnaftate (ANTIFUNGAL SPRAY POWDER) 1 % spray Apply topically 2 (two) times daily. 130 g 0  . torsemide (DEMADEX) 20 MG tablet Take 2 tablets (40 mg total) by mouth daily. Take extra 20 mg tablet once daily as needed for 3 lbs or more weight gain. 180 tablet 3  . traZODone (DESYREL) 100 MG tablet Take 300 mg by mouth at bedtime.     . vitamin C (ASCORBIC ACID) 500 MG tablet Take 500 mg by mouth 2 (two) times daily.    Marland Kitchen warfarin (COUMADIN) 5 MG tablet TAKE 1 TABLET DAILY AS DIRECTED BY THE COUMADIN CLINIC 30 tablet 3  . Wheat Dextrin (BENEFIBER) POWD Take by mouth. Mix with Miralax one to two times daily as needed for constipation.     No current facility-administered medications for this visit.     Patient Active Problem List   Diagnosis Date Noted  . Chronic renal insufficiency 10/06/2017  . Spondylosis without myelopathy or radiculopathy, lumbar region 09/16/2017  . Hypokalemia 04/30/2017  . Leg wound, left, sequela 04/30/2017  . Unstageable pressure ulcer of sacral region ([place]) 04/30/2017  . Fall   . Pressure injury of skin 04/29/2017  . Acute on chronic diastolic (congestive) heart failure ([place])   . PAH  (pulmonary artery hypertension) ([place])   . Right-sided heart failure ([place]) 04/26/2017  . Chronic diastolic heart failure ([place]) 04/21/2017  . Pulmonary hypertension, primary ([place]) 04/21/2017  . Severe tricuspid regurgitation 03/12/2017  . Bilateral lower extremity edema 02/25/2017  . Anticoagulated 02/25/2017  . Varicose veins of right lower extremity with complications 37/16/9678  . Obstructive lung disease (generalized) ([place]) 09/26/2016  . Carpal tunnel syndrome 06/25/2016  . Hereditary and idiopathic peripheral neuropathy 06/25/2016  . Paresthesia 05/27/2016  . Abnormality of gait 05/27/2016  . Chest x-ray abnormality 01/09/2016  . Paroxysmal atrial flutter ([place]) 12/05/2015  . Constipation 06/13/2015  . Encounter for therapeutic drug monitoring 05/25/2014  . Syncope 04/03/2014  . Rhabdomyolysis 04/03/2014  . UTI (urinary tract infection) 04/03/2014  . Acute renal failure ([place]) 04/03/2014  . Leukocytosis 04/03/2014  . Internal nasal lesion 05/15/2013  .  Low back pain 04/19/2013  . Melanoma ([place]) 09/30/2012  . Cough 03/26/2012  . Hx of mitral valve repair 11/19/2011  . Long term (current) use of anticoagulants 11/03/2011  . Decubitus skin ulcer 10/28/2011  . Pleural effusion due to congestive heart failure ([place]) 10/28/2011  . Atrial fibrillation ([place]) 10/07/2011  . S/P mitral valve repair 10/01/2011  . Valvular heart disease 08/21/2011  . Hearing loss 04/09/2011  . THROMBOCYTOPENIA 09/19/2010  . ADENOCARCINOMA, PROSTATE 09/17/2010  . CALLUS, LEFT FOOT 09/17/2010  . BACK PAIN, CHRONIC 09/17/2010  . TINEA PEDIS 05/28/2009  . DERMATITIS, ATOPIC 04/10/2009  . HYPERCHOLESTEROLEMIA 06/09/2008  . DEPRESSION 06/09/2008  . HYPERTENSION, BENIGN ESSENTIAL 06/09/2008  . GERD 06/09/2008  . OSTEOARTHRITIS, GENERALIZED, MULTIPLE JOINTS 06/09/2008  . MUSCLE SPASM, BACK 06/09/2008  . PERSONAL HISTORY MALIGNANT NEOPLASM PROSTATE 06/09/2008  . PERSONAL HISTORY OF MALIGNANT MELANOMA OF SKIN  06/09/2008  . ARRHYTHMIA, HX OF 06/09/2008  . Personal history of colonic polyps 06/09/2008   Past Medical History:  Diagnosis Date  . Abnormality of gait 05/27/2016  . Adenomatous polyps   . Carpal tunnel syndrome 06/25/2016   Right  . Depressive disorder, not elsewhere classified   . First degree AV block    Holter 3/18: NSR, PACs, PVCs, no AFib, no pauses.  Marland Kitchen Hereditary and idiopathic peripheral neuropathy 06/25/2016  . Hypertension   . Internal nasal lesion 05/15/2013  . Melanoma ([place])    Left Shoulder  . Mitral regurgitation   . MVP (mitral valve prolapse)    a. With severe MR s/p Complex valvuloplasty including artificial Gore-tex neochord placement x4, chordal transposition x1, chordal release x1, # 32 mm Sorin Memo 3D Ring Annuloplasty 2012. // b. Echo 2/18: mild LVH, EF 50-55, mild AI, MV repair with mild MR, mod LAE, mod RVE, severe RAE, severe TR  . Neuropathy   . Normal coronary arteries    a. Normal coronary anatomy by cath 2012.  . Osteoarthritis    Knees  . PAF (paroxysmal atrial fibrillation) ([place])    a. Post-op MVR 2012.  Marland Kitchen Personal history of colonic polyps   . Prostate cancer ([place])   . Pulmonary HTN ([place])    a. Mild-mod by cath 2012.  . Pure hypercholesterolemia   . PVC (premature ventricular contraction)   . Thrombocytopenia ([place])   . Vision abnormalities    Cornea scarring    Family History  Problem Relation Age of Onset  . Clotting disorder Brother        CVA's  . Arthritis Mother   . Hypertension Mother   . Stroke Mother   . Hypertension Father   . Psychosis Father        psychiatric care  . Colon cancer Neg Hx   . Stomach cancer Neg Hx   . Heart attack Neg Hx     Past Surgical History:  Procedure Laterality Date  . CARDIAC CATHETERIZATION  09/2011   Pre-op for MVR -- normal coronaries.  Marland Kitchen CARDIOVERSION N/A 01/02/2016   Procedure: CARDIOVERSION;  Surgeon: Thayer Headings, MD;  Location: [facility] ENDOSCOPY;  Service: Cardiovascular;  Laterality: N/A;    . COLONOSCOPY W/ POLYPECTOMY    . INGUINAL HERNIA REPAIR  09/2009   Left  . KNEE ARTHROSCOPY      left x3  and right x2  . Melanoma Surgery     2001, 2005, 2006, 2009  . MITRAL VALVE REPAIR  10/01/2011   complex valvuloplasty with Goretex cord replacement and chordal transposition 23mm Sorin Memo 3D  ring annuloplasty  . Nuclear Stress Test  09/2006   EF-64%, Normal  . PROSTATECTOMY  1993  . RIGHT HEART CATH N/A 04/29/2017   Procedure: Right Heart Cath;  Surgeon: Jolaine Artist, MD;  Location: [facility] CV LAB;  Service: Cardiovascular;  Laterality: N/A;  . ROOT CANAL  08-19-12  . ROTATOR CUFF REPAIR  2003   left  . TEE WITHOUT CARDIOVERSION  09/26/2011   Procedure: TRANSESOPHAGEAL ECHOCARDIOGRAM (TEE);  Surgeon: Lelon Perla, MD;  Location: [facility] ENDOSCOPY;  Service: Cardiovascular;  Laterality: N/A;  . US ECHOCARDIOGRAPHY  09/2009, 08/1011   mild LVH,mild AI,MVP with mild MR, mild-mod. TR with mild Pulm. HTN, EF-55-60%   Social History   Occupational History  . Not on file  Tobacco Use  . Smoking status: Never Smoker  . Smokeless tobacco: Never Used  Substance and Sexual Activity  . Alcohol use: No    Alcohol/week: 0.0 oz    Comment: Last drink in 2000  . Drug use: No  . Sexual activity: Not on file

## 2018-03-03 ENCOUNTER — Encounter (INDEPENDENT_AMBULATORY_CARE_PROVIDER_SITE_OTHER): Payer: Self-pay | Admitting: Orthopedic Surgery

## 2018-03-03 ENCOUNTER — Ambulatory Visit (INDEPENDENT_AMBULATORY_CARE_PROVIDER_SITE_OTHER): Payer: Medicare Other | Admitting: Orthopedic Surgery

## 2018-03-03 DIAGNOSIS — M1711 Unilateral primary osteoarthritis, right knee: Secondary | ICD-10-CM | POA: Diagnosis not present

## 2018-03-03 DIAGNOSIS — M1712 Unilateral primary osteoarthritis, left knee: Secondary | ICD-10-CM

## 2018-03-03 MED ORDER — SODIUM HYALURONATE (VISCOSUP) 20 MG/2ML IX SOSY
20.0000 mg | PREFILLED_SYRINGE | INTRA_ARTICULAR | Status: AC | PRN
  Administered 2018-03-03: 20 mg via INTRA_ARTICULAR

## 2018-03-03 MED ORDER — LIDOCAINE HCL 1 % IJ SOLN
2.0000 mL | INTRAMUSCULAR | Status: AC | PRN
  Administered 2018-03-03: 2 mL

## 2018-03-03 NOTE — Progress Notes (Signed)
Office Visit Note   Patient: [name] [name]           Date of Birth: Feb 09, 1931           MRN: [id number] Visit Date: 03/03/2018              Requested by: [name], MD [address] [place], [place] [address] PCP: [name], MD   Assessment & Plan: Visit Diagnoses:  1. Unilateral primary osteoarthritis, left knee   2. Unilateral primary osteoarthritis, right knee     Plan:  #1: Third Euflexxa injection was given bilaterally without difficulty #2: Follow back up as needed  Follow-Up Instructions: Return if symptoms worsen or fail to improve.   Orders:  No orders of the defined types were placed in this encounter.  No orders of the defined types were placed in this encounter.     Procedures: Large Joint Inj: bilateral knee on 03/03/2018 11:22 AM Indications: pain and joint swelling Details: 25 G 1.5 in needle, anteromedial approach  Arthrogram: No  Medications (Right): 2 mL lidocaine 1 %; 20 mg Sodium Hyaluronate 20 MG/2ML Medications (Left): 2 mL lidocaine 1 %; 20 mg Sodium Hyaluronate 20 MG/2ML Outcome: tolerated well, no immediate complications Procedure, treatment alternatives, risks and benefits explained, specific risks discussed. Consent was given by the patient. Immediately prior to procedure a time out was called to verify the correct patient, procedure, equipment, support staff and site/side marked as required. Patient was prepped and draped in the usual sterile fashion.       Clinical Data: No additional findings.   Subjective: Chief Complaint  Patient presents with  . Right Knee - Pain  . Left Knee - Pain    HPI  [name] is seen today for his final injections of Euflexxa to the knees.  He has intermittent benefit of the injections.  Nuys any reactivity.  Review of Systems  Constitutional: Negative for fatigue.  HENT: Negative for hearing loss.   Respiratory: Negative for apnea, chest tightness and shortness of  breath.   Cardiovascular: Positive for leg swelling. Negative for chest pain and palpitations.  Gastrointestinal: Positive for constipation. Negative for blood in stool and diarrhea.  Genitourinary: Negative for difficulty urinating.  Musculoskeletal: Positive for arthralgias. Negative for back pain, joint swelling, myalgias, neck pain and neck stiffness.  Neurological: Positive for weakness. Negative for numbness and headaches.  Hematological: Does not bruise/bleed easily.  Psychiatric/Behavioral: Positive for sleep disturbance. The patient is not nervous/anxious.      Objective: Vital Signs: There were no vitals taken for this visit.  Physical Exam  Constitutional: He is oriented to person, place, and time. He appears well-developed and well-nourished.  HENT:  Head: Normocephalic and atraumatic.  Eyes: Pupils are equal, round, and reactive to light. EOM are normal.  Pulmonary/Chest: Effort normal.  Neurological: He is alert and oriented to person, place, and time.  Skin: Skin is warm and dry.  Psychiatric: He has a normal mood and affect. His behavior is normal. Judgment and thought content normal.     Ortho Exam  Exam today reveals no reactivity.  No warmth or erythema.  Still has limited range of motion from 5-10 degrees to about 100-105 degrees.  His exam is essentially unchanged.  Specialty Comments:  No specialty comments available.  Imaging: No results found.   PMFS History: Current Outpatient Medications  Medication Sig Dispense Refill  . amiodarone (PACERONE) 200 MG tablet Take 1 tablet (200 mg  total) by mouth daily. 30 tablet 11  . b complex vitamins tablet Take 1 tablet by mouth daily.    . cycloSPORINE (RESTASIS) 0.05 % ophthalmic emulsion Place 1 drop into both eyes 2 (two) times daily.    Marland Kitchen diltiazem (CARTIA XT) 300 MG 24 hr capsule Take 1 capsule (300 mg total) by mouth daily. 90 capsule 1  . ferrous sulfate 325 (65 FE) MG tablet Take 1 tablet (325 mg total)  2 (two) times daily with a meal by mouth.  3  . gabapentin (NEURONTIN) 300 MG capsule Take 1 capsule (300 mg total) by mouth 2 (two) times daily. 180 capsule 3  . glucosamine-chondroitin 500-400 MG tablet Take 1 tablet by mouth 2 (two) times daily.    Marland Kitchen ketoconazole (NIZORAL) 2 % cream Apply 1 application topically daily. 30 g 0  . linaclotide (LINZESS) 145 MCG CAPS capsule Take 1 capsule (145 mcg total) by mouth daily before breakfast. 30 capsule 6  . metoprolol succinate (TOPROL-XL) 50 MG 24 hr tablet TAKE 1 TABLET DAILY, TAKE WITH OR IMMEDIATELY FOLLOWING A MEAL 90 tablet 3  . Multiple Vitamin (MULTIVITAMIN WITH MINERALS) TABS tablet Take 1 tablet by mouth every evening.     Marland Kitchen MYRBETRIQ 50 MG TB24 tablet Take 1 tablet by mouth daily.    . potassium chloride SA (K-DUR,KLOR-CON) 20 MEQ tablet Take 2 tablets (40 mEq total) by mouth daily. 180 tablet 3  . potassium chloride SA (K-DUR,KLOR-CON) 20 MEQ tablet Take 2 tablets (40 mEq total) by mouth daily. 180 tablet 2  . rosuvastatin (CRESTOR) 5 MG tablet TAKE 1 TABLET DAILY 90 tablet 3  . tolnaftate (ANTIFUNGAL SPRAY POWDER) 1 % spray Apply topically 2 (two) times daily. 130 g 0  . torsemide (DEMADEX) 20 MG tablet Take 2 tablets (40 mg total) by mouth daily. Take extra 20 mg tablet once daily as needed for 3 lbs or more weight gain. 180 tablet 3  . traZODone (DESYREL) 100 MG tablet Take 300 mg by mouth at bedtime.     . vitamin C (ASCORBIC ACID) 500 MG tablet Take 500 mg by mouth 2 (two) times daily.    Marland Kitchen warfarin (COUMADIN) 5 MG tablet TAKE 1 TABLET DAILY AS DIRECTED BY THE COUMADIN CLINIC 30 tablet 3  . Wheat Dextrin (BENEFIBER) POWD Take by mouth. Mix with Miralax one to two times daily as needed for constipation.     No current facility-administered medications for this visit.     Patient Active Problem List   Diagnosis Date Noted  . Chronic renal insufficiency 10/06/2017  . Spondylosis without myelopathy or radiculopathy, lumbar region  09/16/2017  . Hypokalemia 04/30/2017  . Leg wound, left, sequela 04/30/2017  . Unstageable pressure ulcer of sacral region ([place]) 04/30/2017  . Fall   . Pressure injury of skin 04/29/2017  . Acute on chronic diastolic (congestive) heart failure ([place])   . PAH (pulmonary artery hypertension) ([place])   . Right-sided heart failure ([place]) 04/26/2017  . Chronic diastolic heart failure ([place]) 04/21/2017  . Pulmonary hypertension, primary ([place]) 04/21/2017  . Severe tricuspid regurgitation 03/12/2017  . Bilateral lower extremity edema 02/25/2017  . Anticoagulated 02/25/2017  . Varicose veins of right lower extremity with complications 86/57/8469  . Obstructive lung disease (generalized) ([place]) 09/26/2016  . Carpal tunnel syndrome 06/25/2016  . Hereditary and idiopathic peripheral neuropathy 06/25/2016  . Paresthesia 05/27/2016  . Abnormality of gait 05/27/2016  . Chest x-ray abnormality 01/09/2016  . Paroxysmal atrial flutter ([place]) 12/05/2015  .  Constipation 06/13/2015  . Encounter for therapeutic drug monitoring 05/25/2014  . Syncope 04/03/2014  . Rhabdomyolysis 04/03/2014  . UTI (urinary tract infection) 04/03/2014  . Acute renal failure ([place]) 04/03/2014  . Leukocytosis 04/03/2014  . Internal nasal lesion 05/15/2013  . Low back pain 04/19/2013  . Melanoma ([place]) 09/30/2012  . Cough 03/26/2012  . Hx of mitral valve repair 11/19/2011  . Long term (current) use of anticoagulants 11/03/2011  . Decubitus skin ulcer 10/28/2011  . Pleural effusion due to congestive heart failure ([place]) 10/28/2011  . Atrial fibrillation ([place]) 10/07/2011  . S/P mitral valve repair 10/01/2011  . Valvular heart disease 08/21/2011  . Hearing loss 04/09/2011  . THROMBOCYTOPENIA 09/19/2010  . ADENOCARCINOMA, PROSTATE 09/17/2010  . CALLUS, LEFT FOOT 09/17/2010  . BACK PAIN, CHRONIC 09/17/2010  . TINEA PEDIS 05/28/2009  . DERMATITIS, ATOPIC 04/10/2009  . HYPERCHOLESTEROLEMIA 06/09/2008  . DEPRESSION 06/09/2008  .  HYPERTENSION, BENIGN ESSENTIAL 06/09/2008  . GERD 06/09/2008  . OSTEOARTHRITIS, GENERALIZED, MULTIPLE JOINTS 06/09/2008  . MUSCLE SPASM, BACK 06/09/2008  . PERSONAL HISTORY MALIGNANT NEOPLASM PROSTATE 06/09/2008  . PERSONAL HISTORY OF MALIGNANT MELANOMA OF SKIN 06/09/2008  . ARRHYTHMIA, HX OF 06/09/2008  . Personal history of colonic polyps 06/09/2008   Past Medical History:  Diagnosis Date  . Abnormality of gait 05/27/2016  . Adenomatous polyps   . Carpal tunnel syndrome 06/25/2016   Right  . Depressive disorder, not elsewhere classified   . First degree AV block    Holter 3/18: NSR, PACs, PVCs, no AFib, no pauses.  Marland Kitchen Hereditary and idiopathic peripheral neuropathy 06/25/2016  . Hypertension   . Internal nasal lesion 05/15/2013  . Melanoma ([place])    Left Shoulder  . Mitral regurgitation   . MVP (mitral valve prolapse)    a. With severe MR s/p Complex valvuloplasty including artificial Gore-tex neochord placement x4, chordal transposition x1, chordal release x1, # 32 mm Sorin Memo 3D Ring Annuloplasty 2012. // b. Echo 2/18: mild LVH, EF 50-55, mild AI, MV repair with mild MR, mod LAE, mod RVE, severe RAE, severe TR  . Neuropathy   . Normal coronary arteries    a. Normal coronary anatomy by cath 2012.  . Osteoarthritis    Knees  . PAF (paroxysmal atrial fibrillation) ([place])    a. Post-op MVR 2012.  Marland Kitchen Personal history of colonic polyps   . Prostate cancer ([place])   . Pulmonary HTN ([place])    a. Mild-mod by cath 2012.  . Pure hypercholesterolemia   . PVC (premature ventricular contraction)   . Thrombocytopenia ([place])   . Vision abnormalities    Cornea scarring    Family History  Problem Relation Age of Onset  . Clotting disorder Brother        CVA's  . Arthritis Mother   . Hypertension Mother   . Stroke Mother   . Hypertension Father   . Psychosis Father        psychiatric care  . Colon cancer Neg Hx   . Stomach cancer Neg Hx   . Heart attack Neg Hx     Past Surgical History:   Procedure Laterality Date  . CARDIAC CATHETERIZATION  09/2011   Pre-op for MVR -- normal coronaries.  Marland Kitchen CARDIOVERSION N/A 01/02/2016   Procedure: CARDIOVERSION;  Surgeon: Thayer Headings, MD;  Location: [facility] ENDOSCOPY;  Service: Cardiovascular;  Laterality: N/A;  . COLONOSCOPY W/ POLYPECTOMY    . INGUINAL HERNIA REPAIR  09/2009   Left  . KNEE ARTHROSCOPY  left x3  and right x2  . Melanoma Surgery     2001, 2005, 2006, 2009  . MITRAL VALVE REPAIR  10/01/2011   complex valvuloplasty with Goretex cord replacement and chordal transposition 40mm Sorin Memo 3D ring annuloplasty  . Nuclear Stress Test  09/2006   EF-64%, Normal  . PROSTATECTOMY  1993  . RIGHT HEART CATH N/A 04/29/2017   Procedure: Right Heart Cath;  Surgeon: Jolaine Artist, MD;  Location: [facility] CV LAB;  Service: Cardiovascular;  Laterality: N/A;  . ROOT CANAL  08-19-12  . ROTATOR CUFF REPAIR  2003   left  . TEE WITHOUT CARDIOVERSION  09/26/2011   Procedure: TRANSESOPHAGEAL ECHOCARDIOGRAM (TEE);  Surgeon: Lelon Perla, MD;  Location[facility] ENDOSCOPY;  Service: Cardiovascular;  Laterality: N/A;  . US ECHOCARDIOGRAPHY  09/2009, 08/1011   mild LVH,mild AI,MVP with mild MR, mild-mod. TR with mild Pulm. HTN, EF-55-60%   Social History   Occupational History  . Not on file  Tobacco Use  . Smoking status: Never Smoker  . Smokeless tobacco: Never Used  Substance and Sexual Activity  . Alcohol use: No    Alcohol/week: 0.0 oz    Comment: Last drink in 2000  . Drug use: No  . Sexual activity: Not on file

## 2018-03-04 ENCOUNTER — Ambulatory Visit (INDEPENDENT_AMBULATORY_CARE_PROVIDER_SITE_OTHER): Payer: Medicare Other | Admitting: *Deleted

## 2018-03-04 DIAGNOSIS — Z5181 Encounter for therapeutic drug level monitoring: Secondary | ICD-10-CM

## 2018-03-04 DIAGNOSIS — I4891 Unspecified atrial fibrillation: Secondary | ICD-10-CM | POA: Diagnosis not present

## 2018-03-04 LAB — POCT INR: INR: 2

## 2018-03-04 NOTE — Patient Instructions (Signed)
Description   Continue taking same dosage 1/2 tablet daily. Recheck in 4 weeks. Call [phone] if scheduled for any procedures or on any new medications

## 2018-03-11 ENCOUNTER — Ambulatory Visit (HOSPITAL_BASED_OUTPATIENT_CLINIC_OR_DEPARTMENT_OTHER)
Admission: RE | Admit: 2018-03-11 | Discharge: 2018-03-11 | Disposition: A | Discharge status: Home / Self Care | Payer: Medicare Other | Source: Ambulatory Visit | Attending: Medical | Admitting: Medical

## 2018-03-11 ENCOUNTER — Ambulatory Visit (INDEPENDENT_AMBULATORY_CARE_PROVIDER_SITE_OTHER): Payer: Medicare Other | Admitting: Medical

## 2018-03-11 ENCOUNTER — Encounter: Payer: Self-pay | Admitting: Medical

## 2018-03-11 VITALS — BP 93/53 | HR 60 | Temp 97.7°F | Resp 16 | Ht 76.0 in | Wt 162.0 lb

## 2018-03-11 DIAGNOSIS — R062 Wheezing: Secondary | ICD-10-CM | POA: Diagnosis not present

## 2018-03-11 DIAGNOSIS — J90 Pleural effusion, not elsewhere classified: Secondary | ICD-10-CM | POA: Diagnosis not present

## 2018-03-11 DIAGNOSIS — J9811 Atelectasis: Secondary | ICD-10-CM | POA: Insufficient documentation

## 2018-03-11 DIAGNOSIS — I517 Cardiomegaly: Secondary | ICD-10-CM | POA: Diagnosis not present

## 2018-03-11 DIAGNOSIS — I509 Heart failure, unspecified: Secondary | ICD-10-CM | POA: Diagnosis not present

## 2018-03-11 DIAGNOSIS — R059 Cough, unspecified: Secondary | ICD-10-CM

## 2018-03-11 DIAGNOSIS — R05 Cough: Secondary | ICD-10-CM | POA: Diagnosis not present

## 2018-03-11 DIAGNOSIS — J40 Bronchitis, not specified as acute or chronic: Secondary | ICD-10-CM | POA: Diagnosis not present

## 2018-03-11 LAB — CBC WITH DIFFERENTIAL/PLATELET
BASOS ABS: 0 10*3/uL (ref 0.0–0.1)
Basophils Relative: 0.6 % (ref 0.0–3.0)
EOS PCT: 0.1 % (ref 0.0–5.0)
Eosinophils Absolute: 0 10*3/uL (ref 0.0–0.7)
HCT: 39.1 % (ref 39.0–52.0)
HEMOGLOBIN: 13 g/dL (ref 13.0–17.0)
Lymphocytes Relative: 13.3 % (ref 12.0–46.0)
Lymphs Abs: 1.1 10*3/uL (ref 0.7–4.0)
MCHC: 33.1 g/dL (ref 30.0–36.0)
MCV: 86.9 fl (ref 78.0–100.0)
MONOS PCT: 26.9 % — AB (ref 3.0–12.0)
Monocytes Absolute: 2.2 10*3/uL — ABNORMAL HIGH (ref 0.1–1.0)
Neutro Abs: 4.8 10*3/uL (ref 1.4–7.7)
Neutrophils Relative %: 59.1 % (ref 43.0–77.0)
Platelets: 59 10*3/uL — ABNORMAL LOW (ref 150.0–400.0)
RBC: 4.5 Mil/uL (ref 4.22–5.81)
RDW: 17.9 % — ABNORMAL HIGH (ref 11.5–15.5)
WBC: 8.1 10*3/uL (ref 4.0–10.5)

## 2018-03-11 LAB — COMPREHENSIVE METABOLIC PANEL
ALBUMIN: 3.8 g/dL (ref 3.5–5.2)
ALK PHOS: 62 U/L (ref 39–117)
ALT: 21 U/L (ref 0–53)
AST: 19 U/L (ref 0–37)
BUN: 27 mg/dL — ABNORMAL HIGH (ref 6–23)
CO2: 32 mEq/L (ref 19–32)
Calcium: 9.6 mg/dL (ref 8.4–10.5)
Chloride: 104 mEq/L (ref 96–112)
Creatinine, Ser: 1.36 mg/dL (ref 0.40–1.50)
GFR: 52.94 mL/min — AB (ref >60.00)
Glucose, Bld: 85 mg/dL (ref 70–99)
POTASSIUM: 4.5 meq/L (ref 3.5–5.1)
Sodium: 141 mEq/L (ref 135–145)
TOTAL PROTEIN: 7 g/dL (ref 6.0–8.3)
Total Bilirubin: 0.8 mg/dL (ref 0.2–1.2)

## 2018-03-11 LAB — BRAIN NATRIURETIC PEPTIDE: Pro B Natriuretic peptide (BNP): 467 pg/mL — ABNORMAL HIGH (ref 0.0–100.0)

## 2018-03-11 MED ORDER — DOXYCYCLINE HYCLATE 100 MG PO TABS: 100.0000 mg | ORAL_TABLET | Freq: Two times a day (BID) | ORAL | 0 refills | Status: DC

## 2018-03-11 MED ORDER — ALBUTEROL SULFATE HFA 108 (90 BASE) MCG/ACT IN AERS: 2.0000 | INHALATION_SPRAY | Freq: Four times a day (QID) | RESPIRATORY_TRACT | 2 refills | Status: DC | PRN

## 2018-03-11 MED ORDER — BENZONATATE 100 MG PO CAPS
100.0000 mg | ORAL_CAPSULE | Freq: Three times a day (TID) | ORAL | 0 refills | Status: DC | PRN
Start: 2018-03-11 — End: 2018-03-31

## 2018-03-11 NOTE — Patient Instructions (Signed)
By exam and recent history, concern for bronchitis versus pneumonia.  Please start doxycycline antibiotic.  For cough, I prescribed benzonatate.  For recent nasal congestion continue your Flonase.  If your wheezing does recur, I am making albuterol inhaler available for you to use.  Please get chest x-ray today.  Will assess if you have any pneumonia.  Also assess your CHF status.  In addition please get CBC, CMP and BNP.  We will update you on lab results and x-rays by call or my chart.  Follow-up in 7 days or as needed.  Also please remember please call the clinic to check your INR and notify them that you are on doxycycline.  They might want to repeat your INR in about 12 to 14 days after you finished the antibiotic.

## 2018-03-11 NOTE — Progress Notes (Signed)
Subjective:    Patient ID: [name] [name], male    DOB: 08-04-1931, 84 y.o.   MRN: [id number]  HPI  Pt has been coughing since last Saturday. Cough has been been productive.  Pt used robitussin otc. Helps some but then cough comes back.  No fever, no chills or sweats.  Early in the weak mld wheeze. Not presently.  Mild nasal congestion. Throat hurts faintly/sharp pain intermittent.  No sinus pain.  No report of any chronic bronchitis or hx of pneumonia.  Cough is moderate.  Last INR was 2 weeks ago. No changes was in range.  Review of Systems  Constitutional: Positive for fatigue. Negative for chills and fever.  HENT: Positive for congestion and postnasal drip. Negative for sinus pressure, sinus pain and trouble swallowing.   Respiratory: Positive for cough and wheezing. Negative for chest tightness.   Cardiovascular: Negative for chest pain and palpitations.  Gastrointestinal: Negative for abdominal distention and abdominal pain.  Musculoskeletal: Negative for back pain.  Neurological: Negative for dizziness, speech difficulty, weakness and headaches.  Hematological: Negative for adenopathy. Does not bruise/bleed easily.  Psychiatric/Behavioral: Negative for behavioral problems and confusion.    Past Medical History:  Diagnosis Date  . Abnormality of gait 05/27/2016  . Adenomatous polyps   . Carpal tunnel syndrome 06/25/2016   Right  . Depressive disorder, not elsewhere classified   . First degree AV block    Holter 3/18: NSR, PACs, PVCs, no AFib, no pauses.  Marland Kitchen Hereditary and idiopathic peripheral neuropathy 06/25/2016  . Hypertension   . Internal nasal lesion 05/15/2013  . Melanoma ([place])    Left Shoulder  . Mitral regurgitation   . MVP (mitral valve prolapse)    a. With severe MR s/p Complex valvuloplasty including artificial Gore-tex neochord placement x4, chordal transposition x1, chordal release x1, # 32 mm Sorin Memo 3D Ring Annuloplasty 2012. // b. Echo 2/18:  mild LVH, EF 50-55, mild AI, MV repair with mild MR, mod LAE, mod RVE, severe RAE, severe TR  . Neuropathy   . Normal coronary arteries    a. Normal coronary anatomy by cath 2012.  . Osteoarthritis    Knees  . PAF (paroxysmal atrial fibrillation) ([place])    a. Post-op MVR 2012.  Marland Kitchen Personal history of colonic polyps   . Prostate cancer ([place])   . Pulmonary HTN ([place])    a. Mild-mod by cath 2012.  . Pure hypercholesterolemia   . PVC (premature ventricular contraction)   . Thrombocytopenia ([place])   . Vision abnormalities    Cornea scarring     Social History   Socioeconomic History  . Marital status: Widowed    Spouse name: Not on file  . Number of children: 2  . Years of education: 28  . Highest education level: Not on file  Occupational History  . Not on file  Social Needs  . Financial resource strain: Not on file  . Food insecurity:    Worry: Not on file    Inability: Not on file  . Transportation needs:    Medical: Not on file    Non-medical: Not on file  Tobacco Use  . Smoking status: Never Smoker  . Smokeless tobacco: Never Used  Substance and Sexual Activity  . Alcohol use: No    Alcohol/week: 0.0 oz    Comment: Last drink in 2000  . Drug use: No  . Sexual activity: Not on file  Lifestyle  . Physical activity:    Days per  week: Not on file    Minutes per session: Not on file  . Stress: Not on file  Relationships  . Social connections:    Talks on phone: Not on file    Gets together: Not on file    Attends religious service: Not on file    Active member of club or organization: Not on file    Attends meetings of clubs or organizations: Not on file    Relationship status: Not on file  . Intimate partner violence:    Fear of current or ex partner: Not on file    Emotionally abused: Not on file    Physically abused: Not on file    Forced sexual activity: Not on file  Other Topics Concern  . Not on file  Social History Narrative   Retired - Optometrist    Widower   2 children (one in [place] and on one in [place])    Drinks 1 cup of coffee per day    Past Surgical History:  Procedure Laterality Date  . CARDIAC CATHETERIZATION  09/2011   Pre-op for MVR -- normal coronaries.  Marland Kitchen CARDIOVERSION N/A 01/02/2016   Procedure: CARDIOVERSION;  Surgeon: Thayer Headings, MD;  Location: [facility] ENDOSCOPY;  Service: Cardiovascular;  Laterality: N/A;  . COLONOSCOPY W/ POLYPECTOMY    . INGUINAL HERNIA REPAIR  09/2009   Left  . KNEE ARTHROSCOPY      left x3  and right x2  . Melanoma Surgery     2001, 2005, 2006, 2009  . MITRAL VALVE REPAIR  10/01/2011   complex valvuloplasty with Goretex cord replacement and chordal transposition 15mm Sorin Memo 3D ring annuloplasty  . Nuclear Stress Test  09/2006   EF-64%, Normal  . PROSTATECTOMY  1993  . RIGHT HEART CATH N/A 04/29/2017   Procedure: Right Heart Cath;  Surgeon: Jolaine Artist, MD;  Location: [place] CV LAB;  Service: Cardiovascular;  Laterality: N/A;  . ROOT CANAL  08-19-12  . ROTATOR CUFF REPAIR  2003   left  . TEE WITHOUT CARDIOVERSION  09/26/2011   Procedure: TRANSESOPHAGEAL ECHOCARDIOGRAM (TEE);  Surgeon: Lelon Perla, MD;  Location: [facility] ENDOSCOPY;  Service: Cardiovascular;  Laterality: N/A;  . US ECHOCARDIOGRAPHY  09/2009, 08/1011   mild LVH,mild AI,MVP with mild MR, mild-mod. TR with mild Pulm. HTN, EF-55-60%    Family History  Problem Relation Age of Onset  . Clotting disorder Brother        CVA's  . Arthritis Mother   . Hypertension Mother   . Stroke Mother   . Hypertension Father   . Psychosis Father        psychiatric care  . Colon cancer Neg Hx   . Stomach cancer Neg Hx   . Heart attack Neg Hx     No Known Allergies  Current Outpatient Medications on File Prior to Visit  Medication Sig Dispense Refill  . amiodarone (PACERONE) 200 MG tablet Take 1 tablet (200 mg total) by mouth daily. 30 tablet 11  . b complex vitamins tablet Take 1 tablet by mouth daily.    .  cycloSPORINE (RESTASIS) 0.05 % ophthalmic emulsion Place 1 drop into both eyes 2 (two) times daily.    Marland Kitchen diltiazem (CARTIA XT) 300 MG 24 hr capsule Take 1 capsule (300 mg total) by mouth daily. 90 capsule 1  . ferrous sulfate 325 (65 FE) MG tablet Take 1 tablet (325 mg total) 2 (two) times daily with a meal by mouth.  3  . gabapentin (NEURONTIN) 300 MG capsule Take 1 capsule (300 mg total) by mouth 2 (two) times daily. 180 capsule 3  . glucosamine-chondroitin 500-400 MG tablet Take 1 tablet by mouth 2 (two) times daily.    Marland Kitchen ketoconazole (NIZORAL) 2 % cream Apply 1 application topically daily. 30 g 0  . linaclotide (LINZESS) 145 MCG CAPS capsule Take 1 capsule (145 mcg total) by mouth daily before breakfast. 30 capsule 6  . metoprolol succinate (TOPROL-XL) 50 MG 24 hr tablet TAKE 1 TABLET DAILY, TAKE WITH OR IMMEDIATELY FOLLOWING A MEAL 90 tablet 3  . Multiple Vitamin (MULTIVITAMIN WITH MINERALS) TABS tablet Take 1 tablet by mouth every evening.     Marland Kitchen MYRBETRIQ 50 MG TB24 tablet Take 1 tablet by mouth daily.    . potassium chloride SA (K-DUR,KLOR-CON) 20 MEQ tablet Take 2 tablets (40 mEq total) by mouth daily. 180 tablet 3  . potassium chloride SA (K-DUR,KLOR-CON) 20 MEQ tablet Take 2 tablets (40 mEq total) by mouth daily. 180 tablet 2  . rosuvastatin (CRESTOR) 5 MG tablet TAKE 1 TABLET DAILY 90 tablet 3  . tolnaftate (ANTIFUNGAL SPRAY POWDER) 1 % spray Apply topically 2 (two) times daily. 130 g 0  . torsemide (DEMADEX) 20 MG tablet Take 2 tablets (40 mg total) by mouth daily. Take extra 20 mg tablet once daily as needed for 3 lbs or more weight gain. 180 tablet 3  . traZODone (DESYREL) 100 MG tablet Take 300 mg by mouth at bedtime.     . vitamin C (ASCORBIC ACID) 500 MG tablet Take 500 mg by mouth 2 (two) times daily.    Marland Kitchen warfarin (COUMADIN) 5 MG tablet TAKE 1 TABLET DAILY AS DIRECTED BY THE COUMADIN CLINIC 30 tablet 3  . Wheat Dextrin (BENEFIBER) POWD Take by mouth. Mix with Miralax one to two  times daily as needed for constipation.    . [DISCONTINUED] pantoprazole (PROTONIX) 40 MG tablet Take 1 tablet (40 mg total) by mouth daily before breakfast.     No current facility-administered medications on file prior to visit.     BP (!) 93/53 (BP Location: Right Arm, Patient Position: Sitting, Cuff Size: Normal)   Pulse 60   Temp 97.7 F (36.5 C) (Oral)   Resp 16   Ht 6\' 4"  (1.93 m)   Wt 162 lb (73.5 kg)   SpO2 92%   BMI 19.72 kg/m       Objective:   Physical Exam    General  Mental Status - Alert. General Appearance - Well groomed. Not in acute distress.  Skin Rashes- No Rashes.  HEENT Head- Normal. Ear Auditory Canal - Left- Normal. Right - Normal.Tympanic Membrane- Left- Normal. Right- Normal. Eye Sclera/Conjunctiva- Left- Normal. Right- Normal. Nose & Sinuses Nasal Mucosa- Left-  Boggy and Congested. Right-  Boggy and  Congested.Bilateral maxillary and frontal sinus pressure. Mouth & Throat Lips: Upper Lip- Normal: no dryness, cracking, pallor, cyanosis, or vesicular eruption. Lower Lip-Normal: no dryness, cracking, pallor, cyanosis or vesicular eruption. Buccal Mucosa- Bilateral- No Aphthous ulcers. Oropharynx- No Discharge or Erythema. Tonsils: Characteristics- Bilateral- No Erythema or Congestion. Size/Enlargement- Bilateral- No enlargement. Discharge- bilateral-None.  Neck Neck- Supple. No Masses.   Chest and Lung Exam Auscultation: Breath Sounds:-even and unlabored.  Scattered rough breath sounds bilaterally.  No wheezing heard presently.  Cardiovascular Auscultation:Rythm- Regular, rate and rhythm. Murmurs & Other Heart Sounds:Ausculatation of the heart reveal- No Murmurs.  Lymphatic Head & Neck General Head & Neck Lymphatics: Bilateral: Description- No  Localized lymphadenopathy.   Lower ext- 1+ pedal edema. Symmetric calf negative homan sign bilateral.    Assessment & Plan:  By exam and recent history, concern for bronchitis versus  pneumonia.  Please start doxycycline antibiotic.  For cough, I prescribed benzonatate.  For recent nasal congestion continue your Flonase.  If your wheezing does recur, I am making albuterol inhaler available for you to use.  Please get chest x-ray today.  Will assess if you have any pneumonia.  Also assess your CHF status.  In addition please get CBC, CMP and BNP.  We will update you on lab results and x-rays by call or my chart.  Follow-up in 7 days or as needed.  Also please remember please call the clinic to check your INR and notify them that you are on doxycycline.  They might want to repeat your INR in about 12 to 14 days after you finished the antibiotic.  Mackie Pai, PA-C

## 2018-03-16 ENCOUNTER — Ambulatory Visit (INDEPENDENT_AMBULATORY_CARE_PROVIDER_SITE_OTHER): Payer: Medicare Other | Admitting: *Deleted

## 2018-03-16 DIAGNOSIS — Z5181 Encounter for therapeutic drug level monitoring: Secondary | ICD-10-CM

## 2018-03-16 DIAGNOSIS — I4891 Unspecified atrial fibrillation: Secondary | ICD-10-CM | POA: Diagnosis not present

## 2018-03-16 LAB — POCT INR: INR: 2.1

## 2018-03-16 NOTE — Patient Instructions (Signed)
Description   Continue taking same dosage 1/2 tablet daily. Recheck in 2 weeks. Call [phone] if scheduled for any procedures or on any new medications

## 2018-03-24 ENCOUNTER — Ambulatory Visit (INDEPENDENT_AMBULATORY_CARE_PROVIDER_SITE_OTHER): Payer: Medicare Other | Admitting: Family Medicine

## 2018-03-24 ENCOUNTER — Encounter: Payer: Self-pay | Admitting: Family Medicine

## 2018-03-24 VITALS — BP 110/72 | HR 61 | Temp 97.6°F | Ht 77.0 in | Wt 158.4 lb

## 2018-03-24 DIAGNOSIS — K5900 Constipation, unspecified: Secondary | ICD-10-CM | POA: Diagnosis not present

## 2018-03-24 DIAGNOSIS — R634 Abnormal weight loss: Secondary | ICD-10-CM

## 2018-03-24 NOTE — Progress Notes (Signed)
[facility] [employer] [facility] [address], [place], [place] [address] [phone]  Date:  03/24/2018   Name:  [name] [name]   DOB:  1931/07/22   MRN:  [id number]  PCP:  [name], MD    Chief Complaint: Constipation (Pt feels constipation is getting worse. )   History of Present Illness:  [name] [name] is a 84 y.o. very pleasant male patient who presents with the following: Primary pt of Dr. Charlett Blake who I first saw about one year ago with complaint of very recent onset of constipation which we thought might be due to addition of myrbetriq  He then came back with complaint of constipation again in March  We added linzess to his regimen in March- however he is not currently taking this because he was not sure if it was helping  I got a plain film of his belly at that time Here today because he is still having constipation   He is also on miralax but is taking one dose a day so far He is passing stool but it is little small stool balls He has not had what he would call a good BM in about 2 weeks No vomiting but he is having some abd discomfort due to being full of stool He is eating ok He is just takiing one dose of miralax per day rignt now   Also, he notes that he is losing weight, but is not sure quite how much -there has been some fluctuation in his weights as below, but he does seem to have lost overall   Wt Readings from Last 3 Encounters:  03/24/18 158 lb 6.4 oz (71.8 kg)  03/11/18 162 lb (73.5 kg)  02/17/18 170 lb (77.1 kg)  3/19 167 1/19 172 lb 11/18 166 8/18 152 lb 4/18 197 b 01/15/17 192 lbs  He does have a GI doc but has not been seen since his last colon in 2013- done 10/13 per Dr. Carlean Purl  Constipation has been an issue for about one year now - however previous to this he did not suffer from chronic constipation No blood in his stools  No change in stool caliper  He notes that his belly seems full/ bloated  but has not noted any pain really   Pt is also on chronic coumadin managed by cardiology clinic He is a pt of the advanced [facility]F clinic and was last seen there in January  ASSESSMENT & PLAN: 1. Combined diastolic HF with R>L symptoms in the setting of longstanding MV disease.  -Continue torsemide to 40 mg daily and 40 mg if needed.  - Continue Toprol XL 50 mg daily.  - Continue compression hose daily.  2. PAF -Regular pulse.  -Continue diltiazem 300mg  + Amio 200mg  daily.  - Continue warfarin for anticoagulation 3. Severe TR - As above, due to right sided heart failure.  4. PAH - last RHC without severe PAH.  5. Severe MVP S/P Bioprosthetic MVR    Patient Active Problem List   Diagnosis Date Noted  . Chronic renal insufficiency 10/06/2017  . Spondylosis without myelopathy or radiculopathy, lumbar region 09/16/2017  . Hypokalemia 04/30/2017  . Leg wound, left, sequela 04/30/2017  . Unstageable pressure ulcer of sacral region ([place]) 04/30/2017  . Fall   . Pressure injury of skin 04/29/2017  . Acute on chronic diastolic (congestive) heart failure ([place])   . PAH (pulmonary artery hypertension) ([place])   .  Right-sided heart failure ([place]) 04/26/2017  . Chronic diastolic heart failure ([place]) 04/21/2017  . Pulmonary hypertension, primary ([place]) 04/21/2017  . Severe tricuspid regurgitation 03/12/2017  . Bilateral lower extremity edema 02/25/2017  . Anticoagulated 02/25/2017  . Varicose veins of right lower extremity with complications 09/10/8526  . Obstructive lung disease (generalized) ([place]) 09/26/2016  . Carpal tunnel syndrome 06/25/2016  . Hereditary and idiopathic peripheral neuropathy 06/25/2016  . Paresthesia 05/27/2016  . Abnormality of gait 05/27/2016  . Chest x-ray abnormality 01/09/2016  . Paroxysmal atrial flutter ([place]) 12/05/2015  . Constipation 06/13/2015  . Encounter for therapeutic drug monitoring 05/25/2014  . Syncope 04/03/2014  . Rhabdomyolysis 04/03/2014  . UTI  (urinary tract infection) 04/03/2014  . Acute renal failure ([place]) 04/03/2014  . Leukocytosis 04/03/2014  . Internal nasal lesion 05/15/2013  . Low back pain 04/19/2013  . Melanoma ([place]) 09/30/2012  . Cough 03/26/2012  . Hx of mitral valve repair 11/19/2011  . Long term (current) use of anticoagulants 11/03/2011  . Decubitus skin ulcer 10/28/2011  . Pleural effusion due to congestive heart failure ([place]) 10/28/2011  . Atrial fibrillation ([place]) 10/07/2011  . S/P mitral valve repair 10/01/2011  . Valvular heart disease 08/21/2011  . Hearing loss 04/09/2011  . THROMBOCYTOPENIA 09/19/2010  . ADENOCARCINOMA, PROSTATE 09/17/2010  . CALLUS, LEFT FOOT 09/17/2010  . BACK PAIN, CHRONIC 09/17/2010  . TINEA PEDIS 05/28/2009  . DERMATITIS, ATOPIC 04/10/2009  . HYPERCHOLESTEROLEMIA 06/09/2008  . DEPRESSION 06/09/2008  . HYPERTENSION, BENIGN ESSENTIAL 06/09/2008  . GERD 06/09/2008  . OSTEOARTHRITIS, GENERALIZED, MULTIPLE JOINTS 06/09/2008  . MUSCLE SPASM, BACK 06/09/2008  . PERSONAL HISTORY MALIGNANT NEOPLASM PROSTATE 06/09/2008  . PERSONAL HISTORY OF MALIGNANT MELANOMA OF SKIN 06/09/2008  . ARRHYTHMIA, HX OF 06/09/2008  . Personal history of colonic polyps 06/09/2008    Past Medical History:  Diagnosis Date  . Abnormality of gait 05/27/2016  . Adenomatous polyps   . Carpal tunnel syndrome 06/25/2016   Right  . Depressive disorder, not elsewhere classified   . First degree AV block    Holter 3/18: NSR, PACs, PVCs, no AFib, no pauses.  Marland Kitchen Hereditary and idiopathic peripheral neuropathy 06/25/2016  . Hypertension   . Internal nasal lesion 05/15/2013  . Melanoma ([place])    Left Shoulder  . Mitral regurgitation   . MVP (mitral valve prolapse)    a. With severe MR s/p Complex valvuloplasty including artificial Gore-tex neochord placement x4, chordal transposition x1, chordal release x1, # 32 mm Sorin Memo 3D Ring Annuloplasty 2012. // b. Echo 2/18: mild LVH, EF 50-55, mild AI, MV repair with mild  MR, mod LAE, mod RVE, severe RAE, severe TR  . Neuropathy   . Normal coronary arteries    a. Normal coronary anatomy by cath 2012.  . Osteoarthritis    Knees  . PAF (paroxysmal atrial fibrillation) ([place])    a. Post-op MVR 2012.  Marland Kitchen Personal history of colonic polyps   . Prostate cancer ([place])   . Pulmonary HTN ([place])    a. Mild-mod by cath 2012.  . Pure hypercholesterolemia   . PVC (premature ventricular contraction)   . Thrombocytopenia ([place])   . Vision abnormalities    Cornea scarring    Past Surgical History:  Procedure Laterality Date  . CARDIAC CATHETERIZATION  09/2011   Pre-op for MVR -- normal coronaries.  Marland Kitchen CARDIOVERSION N/A 01/02/2016   Procedure: CARDIOVERSION;  Surgeon: Thayer Headings, MD;  Location: [facility] ENDOSCOPY;  Service: Cardiovascular;  Laterality: N/A;  . COLONOSCOPY W/ POLYPECTOMY    .  INGUINAL HERNIA REPAIR  09/2009   Left  . KNEE ARTHROSCOPY      left x3  and right x2  . Melanoma Surgery     2001, 2005, 2006, 2009  . MITRAL VALVE REPAIR  10/01/2011   complex valvuloplasty with Goretex cord replacement and chordal transposition 79mm Sorin Memo 3D ring annuloplasty  . Nuclear Stress Test  09/2006   EF-64%, Normal  . PROSTATECTOMY  1993  . RIGHT HEART CATH N/A 04/29/2017   Procedure: Right Heart Cath;  Surgeon: Jolaine Artist, MD;  Location: [facility] CV LAB;  Service: Cardiovascular;  Laterality: N/A;  . ROOT CANAL  08-19-12  . ROTATOR CUFF REPAIR  2003   left  . TEE WITHOUT CARDIOVERSION  09/26/2011   Procedure: TRANSESOPHAGEAL ECHOCARDIOGRAM (TEE);  Surgeon: Lelon Perla, MD;  Location[facility] ENDOSCOPY;  Service: Cardiovascular;  Laterality: N/A;  . US ECHOCARDIOGRAPHY  09/2009, 08/1011   mild LVH,mild AI,MVP with mild MR, mild-mod. TR with mild Pulm. HTN, EF-55-60%    Social History   Tobacco Use  . Smoking status: Never Smoker  . Smokeless tobacco: Never Used  Substance Use Topics  . Alcohol use: No    Alcohol/week: 0.0 oz    Comment:  Last drink in 2000  . Drug use: No    Family History  Problem Relation Age of Onset  . Clotting disorder Brother        CVA's  . Arthritis Mother   . Hypertension Mother   . Stroke Mother   . Hypertension Father   . Psychosis Father        psychiatric care  . Colon cancer Neg Hx   . Stomach cancer Neg Hx   . Heart attack Neg Hx     No Known Allergies  Medication list has been reviewed and updated.  Current Outpatient Medications on File Prior to Visit  Medication Sig Dispense Refill  . amiodarone (PACERONE) 200 MG tablet Take 1 tablet (200 mg total) by mouth daily. 30 tablet 11  . b complex vitamins tablet Take 1 tablet by mouth daily.    . benzonatate (TESSALON) 100 MG capsule Take 1 capsule (100 mg total) by mouth 3 (three) times daily as needed for cough. 30 capsule 0  . cycloSPORINE (RESTASIS) 0.05 % ophthalmic emulsion Place 1 drop into both eyes 2 (two) times daily.    Marland Kitchen diltiazem (CARTIA XT) 300 MG 24 hr capsule Take 1 capsule (300 mg total) by mouth daily. 90 capsule 1  . gabapentin (NEURONTIN) 300 MG capsule Take 1 capsule (300 mg total) by mouth 2 (two) times daily. 180 capsule 3  . glucosamine-chondroitin 500-400 MG tablet Take 1 tablet by mouth 2 (two) times daily.    Marland Kitchen ketoconazole (NIZORAL) 2 % cream Apply 1 application topically daily. 30 g 0  . linaclotide (LINZESS) 145 MCG CAPS capsule Take 1 capsule (145 mcg total) by mouth daily before breakfast. 30 capsule 6  . metoprolol succinate (TOPROL-XL) 50 MG 24 hr tablet TAKE 1 TABLET DAILY, TAKE WITH OR IMMEDIATELY FOLLOWING A MEAL 90 tablet 3  . Multiple Vitamin (MULTIVITAMIN WITH MINERALS) TABS tablet Take 1 tablet by mouth every evening.     Marland Kitchen MYRBETRIQ 50 MG TB24 tablet Take 1 tablet by mouth daily.    . potassium chloride SA (K-DUR,KLOR-CON) 20 MEQ tablet Take 2 tablets (40 mEq total) by mouth daily. 180 tablet 3  . potassium chloride SA (K-DUR,KLOR-CON) 20 MEQ tablet Take 2 tablets (40 mEq total)  by mouth  daily. 180 tablet 2  . rosuvastatin (CRESTOR) 5 MG tablet TAKE 1 TABLET DAILY 90 tablet 3  . tolnaftate (ANTIFUNGAL SPRAY POWDER) 1 % spray Apply topically 2 (two) times daily. 130 g 0  . torsemide (DEMADEX) 20 MG tablet Take 2 tablets (40 mg total) by mouth daily. Take extra 20 mg tablet once daily as needed for 3 lbs or more weight gain. 180 tablet 3  . traZODone (DESYREL) 100 MG tablet Take 300 mg by mouth at bedtime.     . vitamin C (ASCORBIC ACID) 500 MG tablet Take 500 mg by mouth 2 (two) times daily.    Marland Kitchen warfarin (COUMADIN) 5 MG tablet TAKE 1 TABLET DAILY AS DIRECTED BY THE COUMADIN CLINIC 30 tablet 3  . Wheat Dextrin (BENEFIBER) POWD Take by mouth. Mix with Miralax one to two times daily as needed for constipation.    . [DISCONTINUED] pantoprazole (PROTONIX) 40 MG tablet Take 1 tablet (40 mg total) by mouth daily before breakfast.     No current facility-administered medications on file prior to visit.     Review of Systems:  As per HPI- otherwise negative.   Physical Examination: Vitals:   03/24/18 1216  BP: 110/72  Pulse: 61  Temp: 97.6 F (36.4 C)  SpO2: 95%   Vitals:   03/24/18 1216  Weight: 158 lb 6.4 oz (71.8 kg)  Height: 6\' 5"  (1.956 m)   Body mass index is 18.78 kg/m. Ideal Body Weight: Weight in (lb) to have BMI = 25: 210.4  GEN: WDWN, NAD, Non-toxic, A & O x 3, looks well but thin HEENT: Atraumatic, Normocephalic. Neck supple. No masses, No LAD. Ears and Nose: No external deformity. CV: RRR, No M/G/R. No JVD. No thrill. No extra heart sounds. PULM: CTA B, no wheezes, crackles, rhonchi. No retractions. No resp. distress. No accessory muscle use. ABD: S, NT, ND, +BS. No rebound. No HSM.  Belly is soft but more protuberant than would be expected for his BMI  Bowel sounds are quite active  EXTR: No c/c/e NEURO Normal gait.  PSYCH: Normally interactive. Conversant. Not depressed or anxious appearing.  Calm demeanor.  Thin, elderly man sitting in a WC today   He has noted stool changes for about one year now, has failed to respond to treatment and is now losing weight   Assessment and Plan: Constipation, unspecified constipation type - Plan: Ambulatory referral to Gastroenterology  Weight loss - Plan: Ambulatory referral to Gastroenterology  He has noted stool changes for about one year now, has failed to respond to treatment and is now losing weight which is quite concerning.  Plan to have him seen by GI asap.  He may need a repeat colonoscopy  He is not anemic which is reassuring but colon cancer is a consideration    Signed Lamar Blinks, MD

## 2018-03-24 NOTE — Patient Instructions (Signed)
Good to see you today!  I am going to set you up to see the gastroenterologist here at the [facility], Dr. Lyndel Safe. In the meantime you can increase your miralax to 2 or 3 doses a day if needed  Please let me know if any worsening, pain, or vomiting in the meantime

## 2018-03-31 ENCOUNTER — Ambulatory Visit (INDEPENDENT_AMBULATORY_CARE_PROVIDER_SITE_OTHER): Payer: Medicare Other | Admitting: Physician Assistant

## 2018-03-31 ENCOUNTER — Encounter: Payer: Self-pay | Admitting: Physician Assistant

## 2018-03-31 VITALS — BP 100/62 | HR 68 | Ht 76.0 in | Wt 161.0 lb

## 2018-03-31 DIAGNOSIS — K5730 Diverticulosis of large intestine without perforation or abscess without bleeding: Secondary | ICD-10-CM

## 2018-03-31 DIAGNOSIS — Z86010 Personal history of colonic polyps: Secondary | ICD-10-CM | POA: Diagnosis not present

## 2018-03-31 DIAGNOSIS — K5901 Slow transit constipation: Secondary | ICD-10-CM | POA: Diagnosis not present

## 2018-03-31 NOTE — Patient Instructions (Signed)
Start samples of Linzess 290 mcg-Take 1 tablet daily.    On the weekend take a bowel purge with Miralax 5-6 doses ( 17 grams is 1 dose in 8 oz of water or gatorade ).  Call us next Wednesday-  5-22nd or Thursday - 5-23 with an update. Ask for Amy's nurse, Beth. [phone]- choose option 2.

## 2018-03-31 NOTE — Progress Notes (Addendum)
Subjective:    Patient ID: [name] [name], male    DOB: 1931-02-09, 84 y.o.   MRN: [id number]  HPI "[name] [name]" is a pleasant 84 year old white male, known to Dr. Carlean Purl who is referred today by Dr. Silvestre Mesi for evaluation of persistent constipation.  Exline Patient has history of atrial fib, atrial flutter, just of heart failure, pulmonary hypertension, chronic GERD, depression and prostate cancer.  He is also had adenomatous colon polyps. Last colonoscopy was done in October 2013 with finding of 5 sessile polyps all 3 to 5 mm in size which were removed.  Path was consistent with tubular adenomas.  Also noted severe left colon diverticulosis with some luminal narrowing muscular hypertrophy, small internal hemorrhoids. Patient says he has been having some difficulty with constipation over the past year or more.  He had initially been placed on MiraLAX once daily, then increase to twice daily.  Most recently he was also started on Linzess 145 mcg daily over the past week or so. Patient had abdominal films done on 02/01/2018 did show a large amount of stool throughout the colon, no bowel dilation. He denies any abdominal pain or distention, no nausea.  Appetite has been okay but his weight is down about 10 pounds this year.  He is also on diuretics, and says that he has gotten rid of a lot of fluid over the past several months. He says that even with twice daily MiraLAX he was not having very regular bowel movements.  He says his stools are mostly on the softer or looser side now but he is not passing much stool at a time and still may go 2 to 3 days between bowel movements. Since starting 145 mcg Linzess, he stopped the MiraLAX.  He feels about the same and may pass some stool every couple of days.  There has been no melena or hematochezia.  He is able to be ambulatory at home with a walker.  Review of Systems Pertinent positive and negative review of systems were noted in the above HPI section.  All  other review of systems was otherwise negative.  Outpatient Encounter Medications as of 03/31/2018  Medication Sig  . amiodarone (PACERONE) 200 MG tablet Take 1 tablet (200 mg total) by mouth daily.  Marland Kitchen b complex vitamins tablet Take 1 tablet by mouth daily.  . cycloSPORINE (RESTASIS) 0.05 % ophthalmic emulsion Place 1 drop into both eyes 2 (two) times daily.  Marland Kitchen diltiazem (CARTIA XT) 300 MG 24 hr capsule Take 1 capsule (300 mg total) by mouth daily.  Marland Kitchen gabapentin (NEURONTIN) 300 MG capsule Take 1 capsule (300 mg total) by mouth 2 (two) times daily.  Marland Kitchen glucosamine-chondroitin 500-400 MG tablet Take 1 tablet by mouth 2 (two) times daily.  Marland Kitchen ketoconazole (NIZORAL) 2 % cream Apply 1 application topically daily.  Marland Kitchen linaclotide (LINZESS) 145 MCG CAPS capsule Take 1 capsule (145 mcg total) by mouth daily before breakfast.  . metoprolol succinate (TOPROL-XL) 50 MG 24 hr tablet TAKE 1 TABLET DAILY, TAKE WITH OR IMMEDIATELY FOLLOWING A MEAL  . Multiple Vitamin (MULTIVITAMIN WITH MINERALS) TABS tablet Take 1 tablet by mouth every evening.   Marland Kitchen MYRBETRIQ 50 MG TB24 tablet Take 1 tablet by mouth daily.  . potassium chloride SA (K-DUR,KLOR-CON) 20 MEQ tablet Take 2 tablets (40 mEq total) by mouth daily.  . rosuvastatin (CRESTOR) 5 MG tablet TAKE 1 TABLET DAILY  . tolnaftate (ANTIFUNGAL SPRAY POWDER) 1 % spray Apply topically 2 (two) times daily.  Marland Kitchen  torsemide (DEMADEX) 20 MG tablet Take 2 tablets (40 mg total) by mouth daily. Take extra 20 mg tablet once daily as needed for 3 lbs or more weight gain.  Marland Kitchen traZODone (DESYREL) 100 MG tablet Take 300 mg by mouth at bedtime.   . vitamin C (ASCORBIC ACID) 500 MG tablet Take 500 mg by mouth 2 (two) times daily.  Marland Kitchen warfarin (COUMADIN) 5 MG tablet TAKE 1 TABLET DAILY AS DIRECTED BY THE COUMADIN CLINIC  . [DISCONTINUED] benzonatate (TESSALON) 100 MG capsule Take 1 capsule (100 mg total) by mouth 3 (three) times daily as needed for cough.  . [DISCONTINUED] pantoprazole  (PROTONIX) 40 MG tablet Take 1 tablet (40 mg total) by mouth daily before breakfast.  . [DISCONTINUED] potassium chloride SA (K-DUR,KLOR-CON) 20 MEQ tablet Take 2 tablets (40 mEq total) by mouth daily.  . [DISCONTINUED] Wheat Dextrin (BENEFIBER) POWD Take by mouth. Mix with Miralax one to two times daily as needed for constipation.   No facility-administered encounter medications on file as of 03/31/2018.    No Known Allergies Patient Active Problem List   Diagnosis Date Noted  . Chronic renal insufficiency 10/06/2017  . Spondylosis without myelopathy or radiculopathy, lumbar region 09/16/2017  . Hypokalemia 04/30/2017  . Leg wound, left, sequela 04/30/2017  . Unstageable pressure ulcer of sacral region ([place]) 04/30/2017  . Fall   . Pressure injury of skin 04/29/2017  . Acute on chronic diastolic (congestive) heart failure ([place])   . PAH (pulmonary artery hypertension) ([place])   . Right-sided heart failure ([place]) 04/26/2017  . Chronic diastolic heart failure ([place]) 04/21/2017  . Pulmonary hypertension, primary ([place]) 04/21/2017  . Severe tricuspid regurgitation 03/12/2017  . Bilateral lower extremity edema 02/25/2017  . Anticoagulated 02/25/2017  . Varicose veins of right lower extremity with complications 32/99/2426  . Obstructive lung disease (generalized) ([place]) 09/26/2016  . Carpal tunnel syndrome 06/25/2016  . Hereditary and idiopathic peripheral neuropathy 06/25/2016  . Paresthesia 05/27/2016  . Abnormality of gait 05/27/2016  . Chest x-ray abnormality 01/09/2016  . Paroxysmal atrial flutter ([place]) 12/05/2015  . Constipation 06/13/2015  . Encounter for therapeutic drug monitoring 05/25/2014  . Syncope 04/03/2014  . Rhabdomyolysis 04/03/2014  . UTI (urinary tract infection) 04/03/2014  . Acute renal failure ([place]) 04/03/2014  . Leukocytosis 04/03/2014  . Internal nasal lesion 05/15/2013  . Low back pain 04/19/2013  . Melanoma ([place]) 09/30/2012  . Cough 03/26/2012  . Hx of mitral  valve repair 11/19/2011  . Long term (current) use of anticoagulants 11/03/2011  . Decubitus skin ulcer 10/28/2011  . Pleural effusion due to congestive heart failure ([place]) 10/28/2011  . Atrial fibrillation ([place]) 10/07/2011  . S/P mitral valve repair 10/01/2011  . Valvular heart disease 08/21/2011  . Hearing loss 04/09/2011  . THROMBOCYTOPENIA 09/19/2010  . ADENOCARCINOMA, PROSTATE 09/17/2010  . CALLUS, LEFT FOOT 09/17/2010  . BACK PAIN, CHRONIC 09/17/2010  . TINEA PEDIS 05/28/2009  . DERMATITIS, ATOPIC 04/10/2009  . HYPERCHOLESTEROLEMIA 06/09/2008  . DEPRESSION 06/09/2008  . HYPERTENSION, BENIGN ESSENTIAL 06/09/2008  . GERD 06/09/2008  . OSTEOARTHRITIS, GENERALIZED, MULTIPLE JOINTS 06/09/2008  . MUSCLE SPASM, BACK 06/09/2008  . PERSONAL HISTORY MALIGNANT NEOPLASM PROSTATE 06/09/2008  . PERSONAL HISTORY OF MALIGNANT MELANOMA OF SKIN 06/09/2008  . ARRHYTHMIA, HX OF 06/09/2008  . Personal history of colonic polyps 06/09/2008   Social History   Socioeconomic History  . Marital status: Widowed    Spouse name: Not on file  . Number of children: 2  . Years of education: 57  .  Highest education level: Not on file  Occupational History  . Not on file  Social Needs  . Financial resource strain: Not on file  . Food insecurity:    Worry: Not on file    Inability: Not on file  . Transportation needs:    Medical: Not on file    Non-medical: Not on file  Tobacco Use  . Smoking status: Never Smoker  . Smokeless tobacco: Never Used  Substance and Sexual Activity  . Alcohol use: No    Alcohol/week: 0.0 oz    Comment: Last drink in 2000  . Drug use: No  . Sexual activity: Not on file  Lifestyle  . Physical activity:    Days per week: Not on file    Minutes per session: Not on file  . Stress: Not on file  Relationships  . Social connections:    Talks on phone: Not on file    Gets together: Not on file    Attends religious service: Not on file    Active member of club or  organization: Not on file    Attends meetings of clubs or organizations: Not on file    Relationship status: Not on file  . Intimate partner violence:    Fear of current or ex partner: Not on file    Emotionally abused: Not on file    Physically abused: Not on file    Forced sexual activity: Not on file  Other Topics Concern  . Not on file  Social History Narrative   Retired - Optometrist   Widower   2 children (one in [place] and on one in [place])    Drinks 1 cup of coffee per day    Mr. Abbruzzese's family history includes Arthritis in his mother; Clotting disorder in his brother; Hypertension in his father and mother; Psychosis in his father; Stroke in his mother.      Objective:    Vitals:   03/31/18 1003  BP: 100/62  Pulse: 68    Physical Exam; well-developed elderly white male, in no acute distress, pleasant he is in a wheelchair and here alone today.  Blood pressure 100/62 pulse 68, height 6 foot 4, weight 161, BMI 19.6.  HEENT; nontraumatic normocephalic EOMI PERRLA sclera anicteric, Oropharynx; clear, neck supple, Cardiovascular ;regular rate and rhythm with S1-S2, Pulmonary ;clear bilaterally, Abdomen soft, nontender nondistended bowel sounds are active, no palpable mass or hepatosplenomegaly, Rectal; exam not done, Extremities ;trace edema bilateral ankles, skin warm and dry, Neuro psych; mood and affect appropriate, alert and oriented, grossly nonfocal.  Affect flat       Assessment & Plan:   #9 84 year old white male with chronic constipation over the past year, likely multifactorial and combination of medication effects and decrease in mobility.  He did not have great results with twice daily MiraLAX, and thus far has not noticed any significant difference with Linzess 145 mcg daily  #2 diverticulosis 3 history of adenomatous colon polyps-5 polyps removed October 2013, no recall plan due to age #4 history of atrial fibrillation/a flutter #5.  Chronic  anticoagulation #6.  Depression #7.  Pulmonary hypertension #8.  Congestive heart failure on diuretics #9.  History of prostate CA  Plan; Increase Linzess to 290 mcg once daily.  Patient was given samples today and asked to try this over the next week, if this is helpful he is to call back for prescription We will also give him a bowel purge this coming weekend with 6 doses  of MiraLAX to be taken over 2-hour period . Again patient was asked to call back in about a week with a progress report after increasing dose of Linzess and doing bowel purge.  We can adjust regimen from there.    Amy S Esterwood PA-C 03/31/2018   Cc: Copland, Gay Filler, MD  Agree with Ms. Genia Harold assessment and plan. Gatha Mayer, MD, [facility]

## 2018-04-01 ENCOUNTER — Ambulatory Visit (INDEPENDENT_AMBULATORY_CARE_PROVIDER_SITE_OTHER): Payer: Medicare Other | Admitting: *Deleted

## 2018-04-01 DIAGNOSIS — Z5181 Encounter for therapeutic drug level monitoring: Secondary | ICD-10-CM

## 2018-04-01 DIAGNOSIS — I4891 Unspecified atrial fibrillation: Secondary | ICD-10-CM | POA: Diagnosis not present

## 2018-04-01 DIAGNOSIS — Z9889 Other specified postprocedural states: Secondary | ICD-10-CM | POA: Diagnosis not present

## 2018-04-01 DIAGNOSIS — I4892 Unspecified atrial flutter: Secondary | ICD-10-CM

## 2018-04-01 LAB — POCT INR: INR: 2.2

## 2018-04-01 NOTE — Patient Instructions (Signed)
Description   Continue taking same dosage 1/2 tablet daily. Recheck in 3 weeks. Call [phone] if scheduled for any procedures or on any new medications

## 2018-04-05 ENCOUNTER — Ambulatory Visit (INDEPENDENT_AMBULATORY_CARE_PROVIDER_SITE_OTHER): Payer: Medicare Other | Admitting: Family Medicine

## 2018-04-05 ENCOUNTER — Encounter: Payer: Self-pay | Admitting: Family Medicine

## 2018-04-05 VITALS — BP 90/58 | HR 59 | Temp 97.6°F | Resp 96 | Wt 162.0 lb

## 2018-04-05 DIAGNOSIS — I10 Essential (primary) hypertension: Secondary | ICD-10-CM

## 2018-04-05 DIAGNOSIS — R2231 Localized swelling, mass and lump, right upper limb: Secondary | ICD-10-CM

## 2018-04-05 DIAGNOSIS — K5909 Other constipation: Secondary | ICD-10-CM | POA: Diagnosis not present

## 2018-04-05 DIAGNOSIS — E7800 Pure hypercholesterolemia, unspecified: Secondary | ICD-10-CM

## 2018-04-05 MED ORDER — LINACLOTIDE 290 MCG PO CAPS: 290.0000 ug | ORAL_CAPSULE | Freq: Every day | ORAL | 1 refills | Status: DC

## 2018-04-05 NOTE — Progress Notes (Signed)
Subjective:  I acted as a Education administrator for Dr. Charlett Blake. [place], [place]  Patient ID: [name] [name], male    DOB: February 28, 1931, 84 y.o.   MRN: [id number]  No chief complaint on file.   HPI  Patient is in today for a 6 month follow up. He is following up on his Hypertension and various medical concerns. He is noting a lesion on the back of his right wrist which is fairly large and mostly nontender. Is tolerating Linzess and it helps to regularly. Denies CP/palp/SOB/HA/congestion/fevers/GI or GU c/o. Taking meds as prescribed  Patient Care Team: [name], MD as PCP - General (Family Medicine) Darlin Coco, MD as Referring Physician (Cardiology) Willodean Rosenthal, MD as Consulting Physician (Internal Medicine) Dillingham, Loel Lofty, DO as Consulting Physician (Plastic Surgery) Magnus Sinning, MD as Consulting Physician   Past Medical History:  Diagnosis Date  . Abnormality of gait 05/27/2016  . Adenomatous polyps   . Carpal tunnel syndrome 06/25/2016   Right  . Depressive disorder, not elsewhere classified   . First degree AV block    Holter 3/18: NSR, PACs, PVCs, no AFib, no pauses.  Marland Kitchen Hereditary and idiopathic peripheral neuropathy 06/25/2016  . Hypertension   . Internal nasal lesion 05/15/2013  . Melanoma ([place])    Left Shoulder  . Mitral regurgitation   . MVP (mitral valve prolapse)    a. With severe MR s/p Complex valvuloplasty including artificial Gore-tex neochord placement x4, chordal transposition x1, chordal release x1, # 32 mm Sorin Memo 3D Ring Annuloplasty 2012. // b. Echo 2/18: mild LVH, EF 50-55, mild AI, MV repair with mild MR, mod LAE, mod RVE, severe RAE, severe TR  . Neuropathy   . Normal coronary arteries    a. Normal coronary anatomy by cath 2012.  . Osteoarthritis    Knees  . PAF (paroxysmal atrial fibrillation) ([place])    a. Post-op MVR 2012.  Marland Kitchen Personal history of colonic polyps   . Prostate cancer ([place])   . Pulmonary HTN ([place])    a. Mild-mod by cath 2012.  .  Pure hypercholesterolemia   . PVC (premature ventricular contraction)   . Thrombocytopenia ([place])   . Vision abnormalities    Cornea scarring    Past Surgical History:  Procedure Laterality Date  . CARDIAC CATHETERIZATION  09/2011   Pre-op for MVR -- normal coronaries.  Marland Kitchen CARDIOVERSION N/A 01/02/2016   Procedure: CARDIOVERSION;  Surgeon: Thayer Headings, MD;  Location: [facility] ENDOSCOPY;  Service: Cardiovascular;  Laterality: N/A;  . COLONOSCOPY W/ POLYPECTOMY    . INGUINAL HERNIA REPAIR  09/2009   Left  . KNEE ARTHROSCOPY      left x3  and right x2  . Melanoma Surgery     2001, 2005, 2006, 2009  . MITRAL VALVE REPAIR  10/01/2011   complex valvuloplasty with Goretex cord replacement and chordal transposition 74mm Sorin Memo 3D ring annuloplasty  . Nuclear Stress Test  09/2006   EF-64%, Normal  . PROSTATECTOMY  1993  . RIGHT HEART CATH N/A 04/29/2017   Procedure: Right Heart Cath;  Surgeon: Jolaine Artist, MD;  Location: [place] CV LAB;  Service: Cardiovascular;  Laterality: N/A;  . ROOT CANAL  08-19-12  . ROTATOR CUFF REPAIR  2003   left  . TEE WITHOUT CARDIOVERSION  09/26/2011   Procedure: TRANSESOPHAGEAL ECHOCARDIOGRAM (TEE);  Surgeon: Lelon Perla, MD;  Location: [facility] ENDOSCOPY;  Service: Cardiovascular;  Laterality: N/A;  . US ECHOCARDIOGRAPHY  09/2009, 08/1011  mild LVH,mild AI,MVP with mild MR, mild-mod. TR with mild Pulm. HTN, EF-55-60%    Family History  Problem Relation Age of Onset  . Clotting disorder Brother        CVA's  . Arthritis Mother   . Hypertension Mother   . Stroke Mother   . Hypertension Father   . Psychosis Father        psychiatric care  . Colon cancer Neg Hx   . Stomach cancer Neg Hx   . Heart attack Neg Hx     Social History   Socioeconomic History  . Marital status: Widowed    Spouse name: Not on file  . Number of children: 2  . Years of education: 77  . Highest education level: Not on file  Occupational History  . Not on file   Social Needs  . Financial resource strain: Not on file  . Food insecurity:    Worry: Not on file    Inability: Not on file  . Transportation needs:    Medical: Not on file    Non-medical: Not on file  Tobacco Use  . Smoking status: Never Smoker  . Smokeless tobacco: Never Used  Substance and Sexual Activity  . Alcohol use: No    Alcohol/week: 0.0 oz    Comment: Last drink in 2000  . Drug use: No  . Sexual activity: Not on file  Lifestyle  . Physical activity:    Days per week: Not on file    Minutes per session: Not on file  . Stress: Not on file  Relationships  . Social connections:    Talks on phone: Not on file    Gets together: Not on file    Attends religious service: Not on file    Active member of club or organization: Not on file    Attends meetings of clubs or organizations: Not on file    Relationship status: Not on file  . Intimate partner violence:    Fear of current or ex partner: Not on file    Emotionally abused: Not on file    Physically abused: Not on file    Forced sexual activity: Not on file  Other Topics Concern  . Not on file  Social History Narrative   Retired - Optometrist   Widower   2 children (one in [place] and on one in [place])    Drinks 1 cup of coffee per day    Outpatient Medications Prior to Visit  Medication Sig Dispense Refill  . amiodarone (PACERONE) 200 MG tablet Take 1 tablet (200 mg total) by mouth daily. 30 tablet 11  . b complex vitamins tablet Take 1 tablet by mouth daily.    . cycloSPORINE (RESTASIS) 0.05 % ophthalmic emulsion Place 1 drop into both eyes 2 (two) times daily.    Marland Kitchen diltiazem (CARTIA XT) 300 MG 24 hr capsule Take 1 capsule (300 mg total) by mouth daily. 90 capsule 1  . gabapentin (NEURONTIN) 300 MG capsule Take 1 capsule (300 mg total) by mouth 2 (two) times daily. 180 capsule 3  . glucosamine-chondroitin 500-400 MG tablet Take 1 tablet by mouth 2 (two) times daily.    Marland Kitchen ketoconazole (NIZORAL) 2 % cream  Apply 1 application topically daily. 30 g 0  . metoprolol succinate (TOPROL-XL) 50 MG 24 hr tablet TAKE 1 TABLET DAILY, TAKE WITH OR IMMEDIATELY FOLLOWING A MEAL 90 tablet 3  . Multiple Vitamin (MULTIVITAMIN WITH MINERALS) TABS tablet Take 1 tablet by  mouth every evening.     Marland Kitchen MYRBETRIQ 50 MG TB24 tablet Take 1 tablet by mouth daily.    . potassium chloride SA (K-DUR,KLOR-CON) 20 MEQ tablet Take 2 tablets (40 mEq total) by mouth daily. 180 tablet 3  . rosuvastatin (CRESTOR) 5 MG tablet TAKE 1 TABLET DAILY 90 tablet 3  . tolnaftate (ANTIFUNGAL SPRAY POWDER) 1 % spray Apply topically 2 (two) times daily. 130 g 0  . torsemide (DEMADEX) 20 MG tablet Take 2 tablets (40 mg total) by mouth daily. Take extra 20 mg tablet once daily as needed for 3 lbs or more weight gain. 180 tablet 3  . traZODone (DESYREL) 100 MG tablet Take 300 mg by mouth at bedtime.     . vitamin C (ASCORBIC ACID) 500 MG tablet Take 500 mg by mouth 2 (two) times daily.    Marland Kitchen warfarin (COUMADIN) 5 MG tablet TAKE 1 TABLET DAILY AS DIRECTED BY THE COUMADIN CLINIC 30 tablet 3  . linaclotide (LINZESS) 145 MCG CAPS capsule Take 1 capsule (145 mcg total) by mouth daily before breakfast. 30 capsule 6   No facility-administered medications prior to visit.     No Known Allergies  Review of Systems  Constitutional: Negative for fever and malaise/fatigue.  HENT: Negative for congestion.   Eyes: Negative for blurred vision.  Respiratory: Negative for shortness of breath.   Cardiovascular: Negative for chest pain, palpitations and leg swelling.  Gastrointestinal: Positive for constipation. Negative for abdominal pain, blood in stool and nausea.  Genitourinary: Negative for dysuria and frequency.  Musculoskeletal: Positive for joint pain. Negative for falls.  Skin: Negative for rash.  Neurological: Negative for dizziness, loss of consciousness and headaches.  Endo/Heme/Allergies: Negative for environmental allergies.    Psychiatric/Behavioral: Negative for depression. The patient is not nervous/anxious.        Objective:    Physical Exam  Constitutional: He is oriented to person, place, and time. No distress.  HENT:  Head: Normocephalic and atraumatic.  Eyes: Conjunctivae are normal.  Neck: Neck supple. No thyromegaly present.  Cardiovascular: Normal rate, regular rhythm and normal heart sounds.  No murmur heard. Pulmonary/Chest: Effort normal and breath sounds normal. No respiratory distress.  Abdominal: He exhibits no distension and no mass. There is no tenderness.  Musculoskeletal: He exhibits no edema.  Neurological: He is alert and oriented to person, place, and time.  Skin: Skin is warm.  2 cm soft lesion on posterior surface of wrist. No warmth or redness.  Psychiatric: Judgment normal.    BP (!) 90/58 (BP Location: Left Arm, Patient Position: Sitting, Cuff Size: Normal)   Pulse (!) 59   Temp 97.6 F (36.4 C) (Oral)   Resp (!) 96   Wt 162 lb (73.5 kg)   HC 18" (45.7 cm)   BMI 19.72 kg/m  Wt Readings from Last 3 Encounters:  04/05/18 162 lb (73.5 kg)  03/31/18 161 lb (73 kg)  03/24/18 158 lb 6.4 oz (71.8 kg)   BP Readings from Last 3 Encounters:  04/05/18 (!) 90/58  03/31/18 100/62  03/24/18 110/72     Immunization History  Administered Date(s) Administered  . Influenza Split 08/04/2012  . Influenza Whole 08/15/2008  . Influenza,inj,Quad PF,6+ Mos 08/18/2016  . Influenza-Unspecified 08/17/2013, 10/10/2014, 08/18/2015, 08/17/2017  . Pneumococcal Conjugate-13 10/10/2014  . Pneumococcal Polysaccharide-23 09/29/2017  . Zoster 08/17/2013    Health Maintenance  Topic Date Due  . Samul Dada  03/30/1950  . COLONOSCOPY  08/25/2017  . INFLUENZA VACCINE  06/17/2018  .  PNA vac Low Risk Adult  Completed    Lab Results  Component Value Date   WBC 8.1 03/11/2018   HGB 13.0 03/11/2018   HCT 39.1 03/11/2018   PLT 59.0 (L) 03/11/2018   GLUCOSE 85 03/11/2018   CHOL 103  11/25/2017   TRIG 60.0 11/25/2017   HDL 46.80 11/25/2017   LDLCALC 44 11/25/2017   ALT 21 03/11/2018   AST 19 03/11/2018   NA 141 03/11/2018   K 4.5 03/11/2018   CL 104 03/11/2018   CREATININE 1.36 03/11/2018   BUN 27 (H) 03/11/2018   CO2 32 03/11/2018   TSH 1.39 11/25/2017   PSA 0.07 (L) 09/17/2010   INR 2.2 04/01/2018   HGBA1C 6.0 (H) 04/03/2014    Lab Results  Component Value Date   TSH 1.39 11/25/2017   Lab Results  Component Value Date   WBC 8.1 03/11/2018   HGB 13.0 03/11/2018   HCT 39.1 03/11/2018   MCV 86.9 03/11/2018   PLT 59.0 (L) 03/11/2018   Lab Results  Component Value Date   NA 141 03/11/2018   K 4.5 03/11/2018   CO2 32 03/11/2018   GLUCOSE 85 03/11/2018   BUN 27 (H) 03/11/2018   CREATININE 1.36 03/11/2018   BILITOT 0.8 03/11/2018   ALKPHOS 62 03/11/2018   AST 19 03/11/2018   ALT 21 03/11/2018   PROT 7.0 03/11/2018   ALBUMIN 3.8 03/11/2018   CALCIUM 9.6 03/11/2018   ANIONGAP 8 09/04/2017   GFR 52.94 (L) 03/11/2018   Lab Results  Component Value Date   CHOL 103 11/25/2017   Lab Results  Component Value Date   HDL 46.80 11/25/2017   Lab Results  Component Value Date   LDLCALC 44 11/25/2017   Lab Results  Component Value Date   TRIG 60.0 11/25/2017   Lab Results  Component Value Date   CHOLHDL 2 11/25/2017   Lab Results  Component Value Date   HGBA1C 6.0 (H) 04/03/2014         Assessment & Plan:   Problem List Items Addressed This Visit    HYPERCHOLESTEROLEMIA    Encouraged heart healthy diet, increase exercise, avoid trans fats, consider a krill oil cap daily      HYPERTENSION, BENIGN ESSENTIAL    Well controlled, no changes to meds. Encouraged heart healthy diet such as the DASH diet and exercise as tolerated.       Chronic constipation    Encouraged increased hydration and fiber in diet. Daily probiotics. If bowels not moving can use MOM 2 tbls po in 4 oz of warm prune juice by mouth every 2-3 days. If no results  then repeat in 4 hours with  Dulcolax suppository pr, may repeat again in 4 more hours as needed. Seek care if symptoms worsen. Consider daily Miralax and/or Dulcolax if symptoms persist. Is tolerating Linzess can refill      Relevant Medications   linaclotide (LINZESS) 290 MCG CAPS capsule   Mass of right hand - Primary    Check ultrasound and refer to surgeon      Relevant Orders   [place] RT UPPER EXTREM LTD SOFT TISSUE NON VASCULAR (Completed)      I have discontinued Meta Hatchet. Mikowski's linaclotide. I am also having him start on linaclotide. Additionally, I am having him maintain his traZODone, vitamin C, MYRBETRIQ, multivitamin with minerals, glucosamine-chondroitin, b complex vitamins, cycloSPORINE, potassium chloride SA, amiodarone, diltiazem, gabapentin, rosuvastatin, metoprolol succinate, ketoconazole, warfarin, tolnaftate, and torsemide.  Meds ordered this  encounter  Medications  . linaclotide (LINZESS) 290 MCG CAPS capsule    Sig: Take 1 capsule (290 mcg total) by mouth daily before breakfast.    Dispense:  90 capsule    Refill:  1    CMA served as scribe during this visit. History, Physical and Plan performed by medical provider. Documentation and orders reviewed and attested to.  Penni Homans, MD

## 2018-04-05 NOTE — Patient Instructions (Addendum)
Encouraged increased hydration and fiber in diet. Daily probiotics. If bowels not moving can use MOM 2 tbls po in 4 oz of warm prune juice by mouth every 2-3 days. If no results then repeat in 4 hours with  Dulcolax suppository pr, may repeat again in 4 more hours as needed. Seek care if symptoms worsen. Consider daily Miralax and/or Dulcolax if symptoms persist.   Miralax and Benefiber once to twice daily to prevent constipation  Shingrix is the shingles shot, 2 shots over 2-6 months at pharmacy Constipation, Adult Constipation is when a person:  Poops (has a bowel movement) fewer times in a week than normal.  Has a hard time pooping.  Has poop that is dry, hard, or bigger than normal.  Follow these instructions at home: Eating and drinking   Eat foods that have a lot of fiber, such as: ? Fresh fruits and vegetables. ? Whole grains. ? Beans.  Eat less of foods that are high in fat, low in fiber, or overly processed, such as: ? [place] fries. ? Hamburgers. ? Cookies. ? Candy. ? Soda.  Drink enough fluid to keep your pee (urine) clear or pale yellow. General instructions  Exercise regularly or as told by your doctor.  Go to the restroom when you feel like you need to poop. Do not hold it in.  Take over-the-counter and prescription medicines only as told by your doctor. These include any fiber supplements.  Do pelvic floor retraining exercises, such as: ? Doing deep breathing while relaxing your lower belly (abdomen). ? Relaxing your pelvic floor while pooping.  Watch your condition for any changes.  Keep all follow-up visits as told by your doctor. This is important. Contact a doctor if:  You have pain that gets worse.  You have a fever.  You have not pooped for 4 days.  You throw up (vomit).  You are not hungry.  You lose weight.  You are bleeding from the anus.  You have thin, pencil-like poop (stool). Get help right away if:  You have a fever, and  your symptoms suddenly get worse.  You leak poop or have blood in your poop.  Your belly feels hard or bigger than normal (is bloated).  You have very bad belly pain.  You feel dizzy or you faint. This information is not intended to replace advice given to you by your health care provider. Make sure you discuss any questions you have with your health care provider. Document Released: 04/21/2008 Document Revised: 05/23/2016 Document Reviewed: 04/23/2016 Elsevier Interactive Patient Education  2018 [employer].

## 2018-04-06 ENCOUNTER — Ambulatory Visit (HOSPITAL_BASED_OUTPATIENT_CLINIC_OR_DEPARTMENT_OTHER)
Admission: RE | Admit: 2018-04-06 | Discharge: 2018-04-06 | Disposition: A | Discharge status: Home / Self Care | Payer: Medicare Other | Source: Ambulatory Visit | Attending: Family Medicine | Admitting: Family Medicine

## 2018-04-06 DIAGNOSIS — R2231 Localized swelling, mass and lump, right upper limb: Secondary | ICD-10-CM | POA: Diagnosis not present

## 2018-04-12 ENCOUNTER — Encounter: Payer: Self-pay | Admitting: Family Medicine

## 2018-04-12 DIAGNOSIS — R2231 Localized swelling, mass and lump, right upper limb: Secondary | ICD-10-CM | POA: Insufficient documentation

## 2018-04-12 NOTE — Assessment & Plan Note (Signed)
Encouraged increased hydration and fiber in diet. Daily probiotics. If bowels not moving can use MOM 2 tbls po in 4 oz of warm prune juice by mouth every 2-3 days. If no results then repeat in 4 hours with  Dulcolax suppository pr, may repeat again in 4 more hours as needed. Seek care if symptoms worsen. Consider daily Miralax and/or Dulcolax if symptoms persist. Is tolerating Linzess can refill

## 2018-04-12 NOTE — Assessment & Plan Note (Signed)
Check ultrasound and refer to surgeon

## 2018-04-12 NOTE — Assessment & Plan Note (Signed)
Well controlled, no changes to meds. Encouraged heart healthy diet such as the DASH diet and exercise as tolerated.  °

## 2018-04-12 NOTE — Assessment & Plan Note (Signed)
Encouraged heart healthy diet, increase exercise, avoid trans fats, consider a krill oil cap daily 

## 2018-04-16 ENCOUNTER — Other Ambulatory Visit: Payer: Self-pay

## 2018-04-16 ENCOUNTER — Ambulatory Visit (HOSPITAL_COMMUNITY)
Admission: RE | Admit: 2018-04-16 | Discharge: 2018-04-16 | Disposition: A | Discharge status: Home / Self Care | Payer: Medicare Other | Source: Ambulatory Visit | Attending: Internal Medicine | Admitting: Internal Medicine

## 2018-04-16 VITALS — BP 112/60 | HR 59 | Wt 159.4 lb

## 2018-04-16 DIAGNOSIS — Z9119 Patient's noncompliance with other medical treatment and regimen: Secondary | ICD-10-CM | POA: Diagnosis not present

## 2018-04-16 DIAGNOSIS — I4892 Unspecified atrial flutter: Secondary | ICD-10-CM | POA: Diagnosis not present

## 2018-04-16 DIAGNOSIS — I340 Nonrheumatic mitral (valve) insufficiency: Secondary | ICD-10-CM | POA: Insufficient documentation

## 2018-04-16 DIAGNOSIS — Z8546 Personal history of malignant neoplasm of prostate: Secondary | ICD-10-CM | POA: Insufficient documentation

## 2018-04-16 DIAGNOSIS — R001 Bradycardia, unspecified: Secondary | ICD-10-CM | POA: Diagnosis not present

## 2018-04-16 DIAGNOSIS — I341 Nonrheumatic mitral (valve) prolapse: Secondary | ICD-10-CM | POA: Diagnosis not present

## 2018-04-16 DIAGNOSIS — I5082 Biventricular heart failure: Secondary | ICD-10-CM | POA: Diagnosis not present

## 2018-04-16 DIAGNOSIS — Z7901 Long term (current) use of anticoagulants: Secondary | ICD-10-CM | POA: Diagnosis not present

## 2018-04-16 DIAGNOSIS — G5600 Carpal tunnel syndrome, unspecified upper limb: Secondary | ICD-10-CM | POA: Insufficient documentation

## 2018-04-16 DIAGNOSIS — Z79899 Other long term (current) drug therapy: Secondary | ICD-10-CM | POA: Diagnosis not present

## 2018-04-16 DIAGNOSIS — I480 Paroxysmal atrial fibrillation: Secondary | ICD-10-CM | POA: Insufficient documentation

## 2018-04-16 DIAGNOSIS — I071 Rheumatic tricuspid insufficiency: Secondary | ICD-10-CM

## 2018-04-16 DIAGNOSIS — I5032 Chronic diastolic (congestive) heart failure: Secondary | ICD-10-CM | POA: Diagnosis present

## 2018-04-16 DIAGNOSIS — I110 Hypertensive heart disease with heart failure: Secondary | ICD-10-CM | POA: Insufficient documentation

## 2018-04-16 DIAGNOSIS — Z953 Presence of xenogenic heart valve: Secondary | ICD-10-CM | POA: Diagnosis not present

## 2018-04-16 DIAGNOSIS — Z85820 Personal history of malignant melanoma of skin: Secondary | ICD-10-CM | POA: Diagnosis not present

## 2018-04-16 DIAGNOSIS — E7800 Pure hypercholesterolemia, unspecified: Secondary | ICD-10-CM | POA: Insufficient documentation

## 2018-04-16 DIAGNOSIS — F329 Major depressive disorder, single episode, unspecified: Secondary | ICD-10-CM | POA: Insufficient documentation

## 2018-04-16 DIAGNOSIS — M1990 Unspecified osteoarthritis, unspecified site: Secondary | ICD-10-CM | POA: Insufficient documentation

## 2018-04-16 DIAGNOSIS — Z9889 Other specified postprocedural states: Secondary | ICD-10-CM

## 2018-04-16 DIAGNOSIS — I2729 Other secondary pulmonary hypertension: Secondary | ICD-10-CM | POA: Insufficient documentation

## 2018-04-16 DIAGNOSIS — Z8249 Family history of ischemic heart disease and other diseases of the circulatory system: Secondary | ICD-10-CM | POA: Insufficient documentation

## 2018-04-16 DIAGNOSIS — I4510 Unspecified right bundle-branch block: Secondary | ICD-10-CM | POA: Insufficient documentation

## 2018-04-16 LAB — CBC
HEMATOCRIT: 44.7 % (ref 39.0–52.0)
HEMOGLOBIN: 14.1 g/dL (ref 13.0–17.0)
MCH: 28.4 pg (ref 26.0–34.0)
MCHC: 31.5 g/dL (ref 30.0–36.0)
MCV: 89.9 fL (ref 78.0–100.0)
Platelets: 75 10*3/uL — ABNORMAL LOW (ref 150–400)
RBC: 4.97 MIL/uL (ref 4.22–5.81)
RDW: 17.1 % — ABNORMAL HIGH (ref 11.5–15.5)
WBC: 7 10*3/uL (ref 4.0–10.5)

## 2018-04-16 LAB — PROTIME-INR
INR: 1.81
Prothrombin Time: 20.8 seconds — ABNORMAL HIGH (ref 11.4–15.2)

## 2018-04-16 LAB — COMPREHENSIVE METABOLIC PANEL
ALBUMIN: 4 g/dL (ref 3.5–5.0)
ALT: 24 U/L (ref 17–63)
ANION GAP: 10 (ref 5–15)
AST: 24 U/L (ref 15–41)
Alkaline Phosphatase: 62 U/L (ref 38–126)
BUN: 27 mg/dL — ABNORMAL HIGH (ref 6–20)
CHLORIDE: 101 mmol/L (ref 101–111)
CO2: 30 mmol/L (ref 22–32)
Calcium: 9.6 mg/dL (ref 8.9–10.3)
Creatinine, Ser: 1.53 mg/dL — ABNORMAL HIGH (ref 0.61–1.24)
GFR calc Af Amer: 46 mL/min — ABNORMAL LOW (ref >60)
GFR calc non Af Amer: 40 mL/min — ABNORMAL LOW (ref >60)
GLUCOSE: 76 mg/dL (ref 65–99)
Potassium: 4.4 mmol/L (ref 3.5–5.1)
SODIUM: 141 mmol/L (ref 135–145)
Total Bilirubin: 1.1 mg/dL (ref 0.3–1.2)
Total Protein: 7.3 g/dL (ref 6.5–8.1)

## 2018-04-16 LAB — T4, FREE: Free T4: 1.39 ng/dL (ref 0.82–1.77)

## 2018-04-16 LAB — TSH: TSH: 1.235 u[IU]/mL (ref 0.350–4.500)

## 2018-04-16 NOTE — Progress Notes (Signed)
Advanced Heart Failure Clinic Consult Note   Referring Physician: Dr. Irish Lack Primary Care: Penni Homans, MD Primary Cardiologist: Dr. Irish Lack ( Previously Mare Ferrari) Primary HF: Dr. Haroldine Laws  HPI: [name] [name] ([place]) is an 84 y.o. male with history of diastolic HF, PAF/Aflutter on coumadin, and h/o severe MV prolapse s/p MV repair 10/01/11 who is referred for further evaluation of his heart failure.   Pt seen in Dr. Irish Lack office 03/11/17 and noted to have worsening peripheral edema. Thought to be related to RV failure.  Pt admitted non-compliance with second lasix dose some days. RHC suggested but pt prefers to avoid invasive procedures at this time. Lasix increased and referred to [facility] for wound care of BLEs.   He was admitted 04/26/17-04/30/17 with a fall, no syncope. Also with right sided heart failure. RHC showed normal cardiac output. No evidence of PAH. Diuresed with IV lasix, weight down about 10 pounds. Discharge weight was 190 pounds. He was discharged to SNF.   Today he returns for HF follow up. Feels "not too bad". Struggles with walking due to bad knees. Uses walker at home. Will use WC for longer distances. Breathing generally good. Can get SOB with occasional with ADLs. Wears compression hose. Edema much better.  Weight down 8-10 pounds over past month. Mouth very dry some mornings. No dizziness. No bleeding warfarin.   ECHO 09/04/2017  LVEF 55-60%  Severe TR, RV moderately to severely dilated. Flattened septum  Personally reviewed  Echo 12/19/16 with LVEF 50-55%, Mild AI, s/p MVR with mild residual MR, Mod LAE, Mod RV dilation, severe RAE, Severe TR.  RHC  RA = 8 RV = 36/7 PA = 33/4 (15) PCW = 6 Fick cardiac output/index = 7.7/3.6 PVR = 1.2 WU Ao sat = 99% PA sat = 69%, 72% SVC sat = 69%  Past Medical History:  Diagnosis Date  . Abnormality of gait 05/27/2016  . Adenomatous polyps   . Carpal tunnel syndrome 06/25/2016   Right  . Depressive disorder, not  elsewhere classified   . First degree AV block    Holter 3/18: NSR, PACs, PVCs, no AFib, no pauses.  Marland Kitchen Hereditary and idiopathic peripheral neuropathy 06/25/2016  . Hypertension   . Internal nasal lesion 05/15/2013  . Melanoma ([place])    Left Shoulder  . Mitral regurgitation   . MVP (mitral valve prolapse)    a. With severe MR s/p Complex valvuloplasty including artificial Gore-tex neochord placement x4, chordal transposition x1, chordal release x1, # 32 mm Sorin Memo 3D Ring Annuloplasty 2012. // b. Echo 2/18: mild LVH, EF 50-55, mild AI, MV repair with mild MR, mod LAE, mod RVE, severe RAE, severe TR  . Neuropathy   . Normal coronary arteries    a. Normal coronary anatomy by cath 2012.  . Osteoarthritis    Knees  . PAF (paroxysmal atrial fibrillation) ([place])    a. Post-op MVR 2012.  Marland Kitchen Personal history of colonic polyps   . Prostate cancer ([place])   . Pulmonary HTN ([place])    a. Mild-mod by cath 2012.  . Pure hypercholesterolemia   . PVC (premature ventricular contraction)   . Thrombocytopenia ([place])   . Vision abnormalities    Cornea scarring   Current Outpatient Medications  Medication Sig Dispense Refill  . amiodarone (PACERONE) 200 MG tablet Take 1 tablet (200 mg total) by mouth daily. 30 tablet 11  . b complex vitamins tablet Take 1 tablet by mouth daily.    . cycloSPORINE (RESTASIS) 0.05 %  ophthalmic emulsion Place 1 drop into both eyes 2 (two) times daily.    Marland Kitchen diltiazem (CARTIA XT) 300 MG 24 hr capsule Take 1 capsule (300 mg total) by mouth daily. 90 capsule 1  . gabapentin (NEURONTIN) 300 MG capsule Take 1 capsule (300 mg total) by mouth 2 (two) times daily. 180 capsule 3  . glucosamine-chondroitin 500-400 MG tablet Take 1 tablet by mouth 2 (two) times daily.    Marland Kitchen ketoconazole (NIZORAL) 2 % cream Apply 1 application topically daily. 30 g 0  . linaclotide (LINZESS) 290 MCG CAPS capsule Take 1 capsule (290 mcg total) by mouth daily before breakfast. 90 capsule 1  . metoprolol  succinate (TOPROL-XL) 50 MG 24 hr tablet TAKE 1 TABLET DAILY, TAKE WITH OR IMMEDIATELY FOLLOWING A MEAL 90 tablet 3  . Multiple Vitamin (MULTIVITAMIN WITH MINERALS) TABS tablet Take 1 tablet by mouth every evening.     Marland Kitchen MYRBETRIQ 50 MG TB24 tablet Take 1 tablet by mouth daily.    . potassium chloride SA (K-DUR,KLOR-CON) 20 MEQ tablet Take 2 tablets (40 mEq total) by mouth daily. 180 tablet 3  . rosuvastatin (CRESTOR) 5 MG tablet TAKE 1 TABLET DAILY 90 tablet 3  . tolnaftate (ANTIFUNGAL SPRAY POWDER) 1 % spray Apply topically 2 (two) times daily. 130 g 0  . torsemide (DEMADEX) 20 MG tablet Take 2 tablets (40 mg total) by mouth daily. Take extra 20 mg tablet once daily as needed for 3 lbs or more weight gain. 180 tablet 3  . traZODone (DESYREL) 100 MG tablet Take 300 mg by mouth at bedtime.     . vitamin C (ASCORBIC ACID) 500 MG tablet Take 500 mg by mouth 2 (two) times daily.    Marland Kitchen warfarin (COUMADIN) 5 MG tablet TAKE 1 TABLET DAILY AS DIRECTED BY THE COUMADIN CLINIC 30 tablet 3  . Wheat Dextrin (BENEFIBER PO) Take by mouth.     No current facility-administered medications for this encounter.    No Known Allergies   Social History   Socioeconomic History  . Marital status: Widowed    Spouse name: Not on file  . Number of children: 2  . Years of education: 40  . Highest education level: Not on file  Occupational History  . Not on file  Social Needs  . Financial resource strain: Not on file  . Food insecurity:    Worry: Not on file    Inability: Not on file  . Transportation needs:    Medical: Not on file    Non-medical: Not on file  Tobacco Use  . Smoking status: Never Smoker  . Smokeless tobacco: Never Used  Substance and Sexual Activity  . Alcohol use: No    Alcohol/week: 0.0 oz    Comment: Last drink in 2000  . Drug use: No  . Sexual activity: Not on file  Lifestyle  . Physical activity:    Days per week: Not on file    Minutes per session: Not on file  . Stress: Not  on file  Relationships  . Social connections:    Talks on phone: Not on file    Gets together: Not on file    Attends religious service: Not on file    Active member of club or organization: Not on file    Attends meetings of clubs or organizations: Not on file    Relationship status: Not on file  . Intimate partner violence:    Fear of current or ex partner: Not on  file    Emotionally abused: Not on file    Physically abused: Not on file    Forced sexual activity: Not on file  Other Topics Concern  . Not on file  Social History Narrative   Retired - Optometrist   Widower   2 children (one in [place] and on one in [place])    Drinks 1 cup of coffee per day   Family History  Problem Relation Age of Onset  . Clotting disorder Brother        CVA's  . Arthritis Mother   . Hypertension Mother   . Stroke Mother   . Hypertension Father   . Psychosis Father        psychiatric care  . Colon cancer Neg Hx   . Stomach cancer Neg Hx   . Heart attack Neg Hx    Vitals:   04/16/18 1053  BP: 112/60  Pulse: (!) 59  SpO2: 93%  Weight: 159 lb 6.4 oz (72.3 kg)   Wt Readings from Last 3 Encounters:  04/16/18 159 lb 6.4 oz (72.3 kg)  04/05/18 162 lb (73.5 kg)  03/31/18 161 lb (73 kg)    PHYSICAL EXAM: General:  Elderly sitting in WC. No resp difficulty HEENT: normal Neck: supple. JVP 6-7. Carotids 2+ bilat; no bruits. No lymphadenopathy or thryomegaly appreciated. Cor: PMI nondisplaced.brady regular no gallop Abdomen: soft, nontender, nondistended. No hepatosplenomegaly. No bruits or masses. Good bowel sounds. Extremities: no cyanosis, clubbing, rash, 2+ edema below socks. Chronic venous stasis changes  Neuro: alert & orientedx3, cranial nerves grossly intact. moves all 4 extremities w/o difficulty. Affect pleasant  ECG: SB 55 RBBB Personally reviewed  ASSESSMENT & PLAN: 1. Combined diastolic HF with R>L symptoms in the setting of longstanding MV disease.  - NYHA II-III. Fluid  status as good as I have seen it in a while. I worry he might even be a little dry but no orthostasis - Continue torsemide to 20 bid for now - Check labs - Continue Toprol XL 50 mg daily.  - Continue compression hose daily.    2. PAF - Remains in NSR on ECG  - Continue diltiazem 300mg  + Amio 200mg  daily.  - Continue warfarin for anticoagulation. Will not switch to DOAC with h/o MV repair  3. Severe TR - No change  - Not candidate for TVR   4. [place] - RHC 6/18 without severe PAH.  (RA 8, PA 33/4 (15), CI 3.6)  5. Severe MVP S/P Bioprosthetic MVR     [name], MD 04/16/18

## 2018-04-16 NOTE — Patient Instructions (Signed)
Routine lab work today. Will notify you of abnormal results  Follow up with Dr.Bensimhon in 4 months.  

## 2018-04-21 ENCOUNTER — Telehealth: Payer: Self-pay | Admitting: Family Medicine

## 2018-04-21 DIAGNOSIS — D696 Thrombocytopenia, unspecified: Secondary | ICD-10-CM

## 2018-04-21 NOTE — Telephone Encounter (Signed)
Called him back and LMOM- I will set him up with hematology for low platelet evaluation

## 2018-04-21 NOTE — Telephone Encounter (Signed)
Copied from [place]. Topic: Quick Communication - See Telephone Encounter >> Apr 21, 2018  2:04 PM Aurelio Brash B wrote: CRM for notification. See Telephone encounter for: 04/21/18.  PT called to let Dr  Lorelei Pont know he has had low platelets for years and it has never been evaluated  and asks where does he go from here? He would like a call back to discuss.

## 2018-04-22 ENCOUNTER — Ambulatory Visit (INDEPENDENT_AMBULATORY_CARE_PROVIDER_SITE_OTHER): Payer: Medicare Other

## 2018-04-22 DIAGNOSIS — I4891 Unspecified atrial fibrillation: Secondary | ICD-10-CM | POA: Diagnosis not present

## 2018-04-22 DIAGNOSIS — Z5181 Encounter for therapeutic drug level monitoring: Secondary | ICD-10-CM | POA: Diagnosis not present

## 2018-04-22 LAB — POCT INR: INR: 1.9 — AB (ref 2.0–3.0)

## 2018-04-22 NOTE — Patient Instructions (Signed)
Description   Take 1 tablet today, then resume same dosage 1/2 tablet daily. Recheck in 3 weeks. Call [phone] if scheduled for any procedures or on any new medications     

## 2018-05-01 ENCOUNTER — Other Ambulatory Visit: Payer: Self-pay | Admitting: Interventional Cardiology

## 2018-05-07 ENCOUNTER — Telehealth: Payer: Self-pay | Admitting: Interventional Cardiology

## 2018-05-07 MED ORDER — DILTIAZEM HCL ER COATED BEADS 300 MG PO CP24: 300.0000 mg | ORAL_CAPSULE | Freq: Every day | ORAL | 0 refills | Status: DC

## 2018-05-07 NOTE — Telephone Encounter (Signed)
New Message   Pt c/o medication issue:  1. Name of Medication: diltiazem (CARDIZEM CD) 300 MG 24 hr capsule  2. How are you currently taking this medication (dosage and times per day)?TAKE 1 CAPSULE DAILY  3. Are you having a reaction (difficulty breathing--STAT)? no  4. What is your medication issue? Pt received a message on his prescription bottle to call in and make an appointment for further refills but the 1st available isn't until 8/1 and he will run out of meds. Please call

## 2018-05-07 NOTE — Telephone Encounter (Signed)
Returned call to patient who states that he only received 30 day supply of diltiazem and message on bottle states that no further refills until seen in the office. Patient scheduled 1st available appointment with Lyda Jester, PA on 06/17/18. Refills sent in for diltiazem so that the patient has enough supply until appointment.

## 2018-05-11 ENCOUNTER — Inpatient Hospital Stay: Payer: Medicare Other

## 2018-05-11 ENCOUNTER — Inpatient Hospital Stay: Payer: Medicare Other | Attending: Hematology & Oncology | Admitting: Hematology & Oncology

## 2018-05-11 VITALS — BP 98/57 | HR 60 | Temp 97.5°F | Resp 17 | Wt 158.3 lb

## 2018-05-11 DIAGNOSIS — D696 Thrombocytopenia, unspecified: Secondary | ICD-10-CM

## 2018-05-11 DIAGNOSIS — G629 Polyneuropathy, unspecified: Secondary | ICD-10-CM | POA: Insufficient documentation

## 2018-05-11 DIAGNOSIS — I10 Essential (primary) hypertension: Secondary | ICD-10-CM | POA: Insufficient documentation

## 2018-05-11 DIAGNOSIS — Z8546 Personal history of malignant neoplasm of prostate: Secondary | ICD-10-CM | POA: Insufficient documentation

## 2018-05-11 DIAGNOSIS — E7800 Pure hypercholesterolemia, unspecified: Secondary | ICD-10-CM | POA: Diagnosis not present

## 2018-05-11 DIAGNOSIS — I340 Nonrheumatic mitral (valve) insufficiency: Secondary | ICD-10-CM | POA: Insufficient documentation

## 2018-05-11 DIAGNOSIS — Z7901 Long term (current) use of anticoagulants: Secondary | ICD-10-CM | POA: Diagnosis not present

## 2018-05-11 DIAGNOSIS — I4891 Unspecified atrial fibrillation: Secondary | ICD-10-CM | POA: Insufficient documentation

## 2018-05-11 DIAGNOSIS — Z79899 Other long term (current) drug therapy: Secondary | ICD-10-CM | POA: Diagnosis not present

## 2018-05-11 DIAGNOSIS — I341 Nonrheumatic mitral (valve) prolapse: Secondary | ICD-10-CM | POA: Insufficient documentation

## 2018-05-11 DIAGNOSIS — Z85820 Personal history of malignant melanoma of skin: Secondary | ICD-10-CM | POA: Insufficient documentation

## 2018-05-11 DIAGNOSIS — Z952 Presence of prosthetic heart valve: Secondary | ICD-10-CM | POA: Diagnosis not present

## 2018-05-11 DIAGNOSIS — M1990 Unspecified osteoarthritis, unspecified site: Secondary | ICD-10-CM | POA: Insufficient documentation

## 2018-05-11 DIAGNOSIS — I480 Paroxysmal atrial fibrillation: Secondary | ICD-10-CM | POA: Diagnosis not present

## 2018-05-11 LAB — CBC WITH DIFFERENTIAL (CANCER CENTER ONLY)
BASOS PCT: 0 %
Band Neutrophils: 2 %
Basophils Absolute: 0 10*3/uL (ref 0.0–0.1)
Blasts: 0 %
EOS ABS: 0.1 10*3/uL (ref 0.0–0.5)
EOS PCT: 1 %
HEMATOCRIT: 41.8 % (ref 38.7–49.9)
Hemoglobin: 13.3 g/dL (ref 13.0–17.1)
LYMPHS ABS: 1.6 10*3/uL (ref 0.9–3.3)
LYMPHS PCT: 19 %
MCH: 29.3 pg (ref 28.0–33.4)
MCHC: 31.8 g/dL — AB (ref 32.0–35.9)
MCV: 92.1 fL (ref 82.0–98.0)
MONO ABS: 0.5 10*3/uL (ref 0.1–0.9)
MONOS PCT: 6 %
Metamyelocytes Relative: 2 %
Myelocytes: 2 %
NEUTROS ABS: 6.3 10*3/uL (ref 1.5–6.5)
Neutrophils Relative %: 68 %
OTHER: 0 %
PLATELETS: 78 10*3/uL — AB (ref 145–400)
Promyelocytes Relative: 0 %
RBC: 4.54 MIL/uL (ref 4.20–5.70)
RDW: 16 % — AB (ref 11.1–15.7)
WBC Count: 8.5 10*3/uL (ref 4.0–10.0)
nRBC: 0 /100 WBC

## 2018-05-11 LAB — PLATELET BY CITRATE

## 2018-05-11 NOTE — Progress Notes (Signed)
Referral MD  Reason for Referral: Chronic thrombocytopenia  No chief complaint on file. : My platelets are low.  HPI: [name] [name] is a very nice 84 year old white male.  He has quite a bit of history.  He has a mitral valve repair.  This was done back in 2015.  He apparently has atrial fibrillation.  He is on amiodarone.  He is on Coumadin.  There is also a history of melanoma.  He has a history of prostate cancer.  He has had a chronic thrombocytopenia for several years.  Going back to the medical record, back in 2015, his platelet count was 50,000.  He has never seen a blood doctor.  I am surprised by this given his low platelets and the fact that he is on anticoagulation.  I am very surprised that the cardiologist or other specialist did not have him see a hematologist.  He is originally from [place].  He worked as an Optometrist for [employer].  He comes in a wheelchair.  He has terrible knees.  He has marked degeneration in his knees.  I think he had recently seen Dr. Vivien Rossetti.  She was in one who felt that a formal hematologic evaluation was necessary.  He is never had any bleeding issues.  He does have some bruising.  Of note, his daughter just underwent an autologous stem cell transplant for myeloma.  This was at [facility].  She apparently is doing quite well.  He has had no liver issues.  He does not drink.  He is to have a beer or glass of hard liquor.  He does not smoke.  He is had multiple surgeries.  So far, he is never had any bleeding from his surgeries.  Overall, I said his performance status is ECOG 3.     Past Medical History:  Diagnosis Date  . Abnormality of gait 05/27/2016  . Adenomatous polyps   . Carpal tunnel syndrome 06/25/2016   Right  . Depressive disorder, not elsewhere classified   . First degree AV block    Holter 3/18: NSR, PACs, PVCs, no AFib, no pauses.  Marland Kitchen Hereditary and idiopathic peripheral neuropathy 06/25/2016  . Hypertension   . Internal nasal  lesion 05/15/2013  . Melanoma ([place])    Left Shoulder  . Mitral regurgitation   . MVP (mitral valve prolapse)    a. With severe MR s/p Complex valvuloplasty including artificial Gore-tex neochord placement x4, chordal transposition x1, chordal release x1, # 32 mm Sorin Memo 3D Ring Annuloplasty 2012. // b. Echo 2/18: mild LVH, EF 50-55, mild AI, MV repair with mild MR, mod LAE, mod RVE, severe RAE, severe TR  . Neuropathy   . Normal coronary arteries    a. Normal coronary anatomy by cath 2012.  . Osteoarthritis    Knees  . PAF (paroxysmal atrial fibrillation) ([place])    a. Post-op MVR 2012.  Marland Kitchen Personal history of colonic polyps   . Prostate cancer ([place])   . Pulmonary HTN ([place])    a. Mild-mod by cath 2012.  . Pure hypercholesterolemia   . PVC (premature ventricular contraction)   . Thrombocytopenia ([place])   . Vision abnormalities    Cornea scarring  :  Past Surgical History:  Procedure Laterality Date  . CARDIAC CATHETERIZATION  09/2011   Pre-op for MVR -- normal coronaries.  Marland Kitchen CARDIOVERSION N/A 01/02/2016   Procedure: CARDIOVERSION;  Surgeon: Thayer Headings, MD;  Location: [facility];  Service: Cardiovascular;  Laterality: N/A;  .  COLONOSCOPY W/ POLYPECTOMY    . INGUINAL HERNIA REPAIR  09/2009   Left  . KNEE ARTHROSCOPY      left x3  and right x2  . Melanoma Surgery     2001, 2005, 2006, 2009  . MITRAL VALVE REPAIR  10/01/2011   complex valvuloplasty with Goretex cord replacement and chordal transposition 48m Sorin Memo 3D ring annuloplasty  . Nuclear Stress Test  09/2006   EF-64%, Normal  . PROSTATECTOMY  1993  . RIGHT HEART CATH N/A 04/29/2017   Procedure: Right Heart Cath;  Surgeon: BJolaine Artist MD;  [facility];  Service: Cardiovascular;  Laterality: N/A;  . ROOT CANAL  08-19-12  . ROTATOR CUFF REPAIR  2003   left  . TEE WITHOUT CARDIOVERSION  09/26/2011   Procedure: TRANSESOPHAGEAL ECHOCARDIOGRAM (TEE);  Surgeon: BLelon Perla MD;   [facility];  Service: Cardiovascular;  Laterality: N/A;  . [place]  09/2009, 08/1011   mild LVH,mild AI,MVP with mild MR, mild-mod. TR with mild Pulm. HTN, EF-55-60%  :   Current Outpatient Medications:  .  amiodarone (PACERONE) 200 MG tablet, Take 1 tablet (200 mg total) by mouth daily., Disp: 30 tablet, Rfl: 11 .  b complex vitamins tablet, Take 1 tablet by mouth daily., Disp: , Rfl:  .  cycloSPORINE (RESTASIS) 0.05 % ophthalmic emulsion, Place 1 drop into both eyes 2 (two) times daily., Disp: , Rfl:  .  diltiazem (CARDIZEM CD) 300 MG 24 hr capsule, Take 1 capsule (300 mg total) by mouth daily., Disp: 90 capsule, Rfl: 0 .  fluticasone (FLONASE) 50 MCG/ACT nasal spray, SHAKE LQ AND U 2 SPRAYS IEN D, Disp: , Rfl: 11 .  gabapentin (NEURONTIN) 300 MG capsule, Take 1 capsule (300 mg total) by mouth 2 (two) times daily., Disp: 180 capsule, Rfl: 3 .  glucosamine-chondroitin 500-400 MG tablet, Take 1 tablet by mouth 2 (two) times daily., Disp: , Rfl:  .  LINZESS 145 MCG CAPS capsule, , Disp: , Rfl: 6 .  metoprolol succinate (TOPROL-XL) 50 MG 24 hr tablet, TAKE 1 TABLET DAILY, TAKE WITH OR IMMEDIATELY FOLLOWING A MEAL, Disp: 90 tablet, Rfl: 3 .  Multiple Vitamin (MULTIVITAMIN WITH MINERALS) TABS tablet, Take 1 tablet by mouth every evening. , Disp: , Rfl:  .  MYRBETRIQ 50 MG TB24 tablet, Take 1 tablet by mouth daily., Disp: , Rfl:  .  potassium chloride SA (K-DUR,KLOR-CON) 20 MEQ tablet, Take 2 tablets (40 mEq total) by mouth daily., Disp: 180 tablet, Rfl: 3 .  rosuvastatin (CRESTOR) 5 MG tablet, TAKE 1 TABLET DAILY, Disp: 90 tablet, Rfl: 3 .  tolnaftate (ANTIFUNGAL SPRAY POWDER) 1 % spray, Apply topically 2 (two) times daily., Disp: 130 g, Rfl: 0 .  torsemide (DEMADEX) 20 MG tablet, Take 2 tablets (40 mg total) by mouth daily. Take extra 20 mg tablet once daily as needed for 3 lbs or more weight gain., Disp: 180 tablet, Rfl: 3 .  traZODone (DESYREL) 100 MG tablet, Take 300 mg  by mouth at bedtime. , Disp: , Rfl:  .  warfarin (COUMADIN) 5 MG tablet, TAKE 1 TABLET DAILY AS DIRECTED BY THE COUMADIN CLINIC, Disp: 30 tablet, Rfl: 3 .  Wheat Dextrin (BENEFIBER PO), Take by mouth., Disp: , Rfl: :  :  No Known Allergies:  Family History  Problem Relation Age of Onset  . Clotting disorder Brother        CVA's  . Arthritis Mother   . Hypertension Mother   .  Stroke Mother   . Hypertension Father   . Psychosis Father        psychiatric care  . Colon cancer Neg Hx   . Stomach cancer Neg Hx   . Heart attack Neg Hx   :  Social History   Socioeconomic History  . Marital status: Widowed    Spouse name: Not on file  . Number of children: 2  . Years of education: 34  . Highest education level: Not on file  Occupational History  . Not on file  Social Needs  . Financial resource strain: Not on file  . Food insecurity:    Worry: Not on file    Inability: Not on file  . Transportation needs:    Medical: Not on file    Non-medical: Not on file  Tobacco Use  . Smoking status: Never Smoker  . Smokeless tobacco: Never Used  Substance and Sexual Activity  . Alcohol use: No    Alcohol/week: 0.0 oz    Comment: Last drink in 2000  . Drug use: No  . Sexual activity: Not on file  Lifestyle  . Physical activity:    Days per week: Not on file    Minutes per session: Not on file  . Stress: Not on file  Relationships  . Social connections:    Talks on phone: Not on file    Gets together: Not on file    Attends religious service: Not on file    Active member of club or organization: Not on file    Attends meetings of clubs or organizations: Not on file    Relationship status: Not on file  . Intimate partner violence:    Fear of current or ex partner: Not on file    Emotionally abused: Not on file    Physically abused: Not on file    Forced sexual activity: Not on file  Other Topics Concern  . Not on file  Social History Narrative   Retired - Optometrist    Widower   2 children (one in [place] and on one in [place])    Drinks 1 cup of coffee per day  :  Review of Systems  Constitutional: Negative.   HENT: Positive for congestion.   Eyes: Negative.   Respiratory: Positive for cough and shortness of breath.   Cardiovascular: Positive for palpitations and leg swelling.  Gastrointestinal: Positive for nausea.  Genitourinary: Positive for frequency and urgency.  Musculoskeletal: Positive for joint pain.  Skin: Negative.   Neurological: Negative.   Endo/Heme/Allergies: Negative.   Psychiatric/Behavioral: Negative.      Exam: Thin white male in no obvious distress.  Vital signs show temperature of 97 5.  Pulse 60.  Blood pressure 111/77.  Weight is 158 pounds.  Head neck exam shows no ocular or oral lesions.  He has no scleral icterus.  He has no adenopathy in the neck.  Thyroid is not palpable.  Lungs are clear bilaterally.  Cardiac exam regular rate and rhythm.  He has a 1/6 systolic ejection murmur.  Abdomen is soft.  He has good bowel sounds.  There is no fluid wave.  There is no palpable liver or spleen tip.  Back exam shows no tenderness over the spine, ribs or hips.  He may have some slight kyphosis.  Extremities shows osteoarthritic changes in his joints.  His knees are particularly involved.  He has chronic edema in his legs.  He has compression stockings on his legs.  Skin  exam shows some scattered ecchymoses.  Neurological exam is nonfocal. @IPVITALS @   Recent Labs    05/11/18 1019  WBC 8.5  HGB 13.3  HCT 41.8  PLT 78*   No results for input(s): NA, K, CL, CO2, GLUCOSE, BUN, CREATININE, CALCIUM in the last 72 hours.  Blood smear review: Normochromic and normocytic population of red blood cells.  There are no nucleated red blood cells.  There are no teardrop cells.  I see no rouleaux formation.  He has no inclusion bodies.  There are no schistocytes or spherocytes.  White blood cells are normal in morphology maturation.  He has  no atypical lymphocytes.  There is no hypersegmented polys.  I see no blasts.  Platelets are decreased in number.  He has several large platelets.  Platelets are well granulated.  Pathology: None    Assessment and Plan: [name] [name] is a 84 year old white male.  He has chronic thrombocytopenia.  It is hard to figure out why this is happening.  It could be medication related.  It could be a possible bone marrow issue.  However, his platelets on the blood smear appear to be a little bit large.  As such, this might be an immune process.  He has had this for at least 4 years.  He has had no problems to date.  Because of his overall poor performance status, I really do not think we have to get aggressive and start doing a major work-up on him.  I do not think a bone marrow biopsy is going to change any recommendations that we have.  He is on Coumadin.  I suppose he is on Coumadin not 1 of the newer oral anticoagulants because of the valvular issue.  I think we can see him back in 4 months.  I think this would be reasonable.  I spent about 45 minutes with him.  I spent all the time face-to-face.  I went over all of his labs.  I was gone through the computer and going back through the system to see how far back everything went with his low platelets.  I am just surprised that he is never seen a hematologist before.  He is very nice.  He served in [employer].  As such, he is a true American hero for sacrificing himself for [place] so that we can do what we do now.

## 2018-05-13 ENCOUNTER — Ambulatory Visit (INDEPENDENT_AMBULATORY_CARE_PROVIDER_SITE_OTHER): Payer: Medicare Other

## 2018-05-13 DIAGNOSIS — I4891 Unspecified atrial fibrillation: Secondary | ICD-10-CM

## 2018-05-13 DIAGNOSIS — Z5181 Encounter for therapeutic drug level monitoring: Secondary | ICD-10-CM

## 2018-05-13 LAB — POCT INR: INR: 2 (ref 2.0–3.0)

## 2018-05-13 NOTE — Patient Instructions (Signed)
Description   Take 1 tablet today, then resume same dosage 1/2 tablet daily. Recheck in 4 weeks. Call [phone] if scheduled for any procedures or on any new medications

## 2018-05-13 NOTE — Progress Notes (Addendum)
Subjective:   [name] [name] is a 84 y.o. male who presents for an Initial Medicare Annual Wellness Visit.  Review of Systems No ROS.  Medicare Wellness Visit. Additional risk factors are reflected in the social history. Cardiac Risk Factors include: advanced age (>106men, >35 women) Sleep patterns: Takes Trazodone. Sleeps at least 8 hrs Home Safety/Smoke Alarms: Feels safe in home. Smoke alarms in place.  Living environment; residence and Firearm Safety: Care giver 9 hrs per day. Electric chair lift. Walk in shower with bench   Eye- every 6 months. Dr.Groat.  Male:   CCS-  No longer doing routine screening due to age.    PSA-  Lab Results  Component Value Date   PSA 0.07 (L) 09/17/2010      Objective:    Today's Vitals   05/18/18 1119  BP: (!) 100/52  Pulse: (!) 56  SpO2: 96%  Weight: 156 lb 8 oz (71 kg)  Height: 6\' 4"  (1.93 m)   Body mass index is 19.05 kg/m.  Advanced Directives 05/18/2018 11/16/2017 04/27/2017 04/26/2017 11/13/2016 10/13/2016 07/09/2016  Does Patient Have a Medical Advance Directive? Yes Yes Yes Yes No Yes Yes  Type of Paramedic of [place];Living will [place];Living will Living will;Healthcare Power of Attorney Living will;Healthcare Power of [place];Living will [place];Living will  Does patient want to make changes to medical advance directive? No - Patient declined - No - Patient declined - - - -  Copy of [place] in Chart? Yes - No - copy requested No - copy requested - No - copy requested No - copy requested  Pre-existing out of facility DNR order (yellow form or pink MOST form) - - - - - - -    Current Medications (verified) Outpatient Encounter Medications as of 05/18/2018  Medication Sig  . amiodarone (PACERONE) 200 MG tablet Take 1 tablet (200 mg total) by mouth daily.  Marland Kitchen b complex vitamins tablet Take 1 tablet by mouth  daily.  . cycloSPORINE (RESTASIS) 0.05 % ophthalmic emulsion Place 1 drop into both eyes 2 (two) times daily.  Marland Kitchen diltiazem (CARDIZEM CD) 300 MG 24 hr capsule Take 1 capsule (300 mg total) by mouth daily.  . fluticasone (FLONASE) 50 MCG/ACT nasal spray SHAKE LQ AND U 2 SPRAYS IEN D  . gabapentin (NEURONTIN) 300 MG capsule Take 1 capsule (300 mg total) by mouth 2 (two) times daily.  Marland Kitchen glucosamine-chondroitin 500-400 MG tablet Take 1 tablet by mouth 2 (two) times daily.  Marland Kitchen LINZESS 145 MCG CAPS capsule   . metoprolol succinate (TOPROL-XL) 50 MG 24 hr tablet TAKE 1 TABLET DAILY, TAKE WITH OR IMMEDIATELY FOLLOWING A MEAL  . Multiple Vitamin (MULTIVITAMIN WITH MINERALS) TABS tablet Take 1 tablet by mouth every evening.   Marland Kitchen MYRBETRIQ 50 MG TB24 tablet Take 1 tablet by mouth daily.  . potassium chloride SA (K-DUR,KLOR-CON) 20 MEQ tablet Take 2 tablets (40 mEq total) by mouth daily.  . rosuvastatin (CRESTOR) 5 MG tablet TAKE 1 TABLET DAILY  . tolnaftate (ANTIFUNGAL SPRAY POWDER) 1 % spray Apply topically 2 (two) times daily.  Marland Kitchen torsemide (DEMADEX) 20 MG tablet Take 2 tablets (40 mg total) by mouth daily. Take extra 20 mg tablet once daily as needed for 3 lbs or more weight gain.  Marland Kitchen traZODone (DESYREL) 100 MG tablet Take 300 mg by mouth at bedtime.   Marland Kitchen warfarin (COUMADIN) 5 MG tablet TAKE 1 TABLET  DAILY AS DIRECTED BY THE COUMADIN CLINIC  . [place] Dextrin (BENEFIBER PO) Take by mouth.  . [DISCONTINUED] pantoprazole (PROTONIX) 40 MG tablet Take 1 tablet (40 mg total) by mouth daily before breakfast.   No facility-administered encounter medications on file as of 05/18/2018.     Allergies (verified) Patient has no known allergies.   History: Past Medical History:  Diagnosis Date  . Abnormality of gait 05/27/2016  . Adenomatous polyps   . Carpal tunnel syndrome 06/25/2016   Right  . Depressive disorder, not elsewhere classified   . First degree AV block    Holter 3/18: NSR, PACs, PVCs, no AFib, no  pauses.  Marland Kitchen Hereditary and idiopathic peripheral neuropathy 06/25/2016  . Hypertension   . Internal nasal lesion 05/15/2013  . Melanoma ([place])    Left Shoulder  . Mitral regurgitation   . MVP (mitral valve prolapse)    a. With severe MR s/p Complex valvuloplasty including artificial Gore-tex neochord placement x4, chordal transposition x1, chordal release x1, # 32 mm Sorin Memo 3D Ring Annuloplasty 2012. // b. Echo 2/18: mild LVH, EF 50-55, mild AI, MV repair with mild MR, mod LAE, mod RVE, severe RAE, severe TR  . Neuropathy   . Normal coronary arteries    a. Normal coronary anatomy by cath 2012.  . Osteoarthritis    Knees  . PAF (paroxysmal atrial fibrillation) ([place])    a. Post-op MVR 2012.  Marland Kitchen Personal history of colonic polyps   . Prostate cancer ([place])   . Pulmonary HTN ([place])    a. Mild-mod by cath 2012.  . Pure hypercholesterolemia   . PVC (premature ventricular contraction)   . Thrombocytopenia ([place])   . Vision abnormalities    Cornea scarring   Past Surgical History:  Procedure Laterality Date  . CARDIAC CATHETERIZATION  09/2011   Pre-op for MVR -- normal coronaries.  Marland Kitchen CARDIOVERSION N/A 01/02/2016   Procedure: CARDIOVERSION;  Surgeon: Thayer Headings, MD;  Location: [facility] ENDOSCOPY;  Service: Cardiovascular;  Laterality: N/A;  . COLONOSCOPY W/ POLYPECTOMY    . INGUINAL HERNIA REPAIR  09/2009   Left  . KNEE ARTHROSCOPY      left x3  and right x2  . Melanoma Surgery     2001, 2005, 2006, 2009  . MITRAL VALVE REPAIR  10/01/2011   complex valvuloplasty with Goretex cord replacement and chordal transposition 2mm Sorin Memo 3D ring annuloplasty  . Nuclear Stress Test  09/2006   EF-64%, Normal  . PROSTATECTOMY  1993  . RIGHT HEART CATH N/A 04/29/2017   Procedure: Right Heart Cath;  Surgeon: Jolaine Artist, MD;  Location: [facility] CV LAB;  Service: Cardiovascular;  Laterality: N/A;  . ROOT CANAL  08-19-12  . ROTATOR CUFF REPAIR  2003   left  . TEE WITHOUT CARDIOVERSION   09/26/2011   Procedure: TRANSESOPHAGEAL ECHOCARDIOGRAM (TEE);  Surgeon: Lelon Perla, MD;  Location: [facility] ENDOSCOPY;  Service: Cardiovascular;  Laterality: N/A;  . US ECHOCARDIOGRAPHY  09/2009, 08/1011   mild LVH,mild AI,MVP with mild MR, mild-mod. TR with mild Pulm. HTN, EF-55-60%   Family History  Problem Relation Age of Onset  . Clotting disorder Brother        CVA's  . Arthritis Mother   . Hypertension Mother   . Stroke Mother   . Hypertension Father   . Psychosis Father        psychiatric care  . Colon cancer Neg Hx   . Stomach cancer Neg Hx   .  Heart attack Neg Hx    Social History   Socioeconomic History  . Marital status: Widowed    Spouse name: Not on file  . Number of children: 2  . Years of education: 96  . Highest education level: Not on file  Occupational History  . Not on file  Social Needs  . Financial resource strain: Not on file  . Food insecurity:    Worry: Not on file    Inability: Not on file  . Transportation needs:    Medical: Not on file    Non-medical: Not on file  Tobacco Use  . Smoking status: Never Smoker  . Smokeless tobacco: Never Used  Substance and Sexual Activity  . Alcohol use: No    Alcohol/week: 0.0 oz    Comment: Last drink in 2000  . Drug use: No  . Sexual activity: Not on file  Lifestyle  . Physical activity:    Days per week: Not on file    Minutes per session: Not on file  . Stress: Not on file  Relationships  . Social connections:    Talks on phone: Not on file    Gets together: Not on file    Attends religious service: Not on file    Active member of club or organization: Not on file    Attends meetings of clubs or organizations: Not on file    Relationship status: Not on file  Other Topics Concern  . Not on file  Social History Narrative   Retired - Optometrist   Widower   2 children (one in [place] and on one in [place])    Drinks 1 cup of coffee per day   Tobacco Counseling Counseling given: Not  Answered   Clinical Intake: Pain : No/denies pain  Activities of Daily Living In your present state of health, do you have any difficulty performing the following activities: 05/18/2018  Hearing? Y  Comment wears hearing aids.   Vision? N  Comment wears glasses  Difficulty concentrating or making decisions? N  Walking or climbing stairs? Y  Dressing or bathing? Y  Doing errands, shopping? Y  Comment No longer driving.  Preparing Food and eating ? N  Using the Toilet? N  In the past six months, have you accidently leaked urine? Y  Comment wears briefs.  Do you have problems with loss of bowel control? N  Managing your Medications? N  Managing your Finances? N  Housekeeping or managing your Housekeeping? Y  Some recent data might be hidden     Immunizations and Health Maintenance Immunization History  Administered Date(s) Administered  . Influenza Split 08/04/2012  . Influenza Whole 08/15/2008  . Influenza,inj,Quad PF,6+ Mos 08/18/2016  . Influenza-Unspecified 08/17/2013, 10/10/2014, 08/18/2015, 08/17/2017  . Pneumococcal Conjugate-13 10/10/2014  . Pneumococcal Polysaccharide-23 09/29/2017  . Zoster 08/17/2013   Health Maintenance Due  Topic Date Due  . Samul Dada  03/30/1950    Patient Care Team: [name], MD as PCP - General (Family Medicine) Darlin Coco, MD as Referring Physician (Cardiology) Willodean Rosenthal, MD as Consulting Physician (Internal Medicine) Dillingham, Loel Lofty, DO as Consulting Physician (Plastic Surgery) Magnus Sinning, MD as Consulting Physician  Indicate any recent Medical Services you may have received from other than [facility] providers in the past year (date may be approximate).    Assessment:   This is a routine wellness examination for [place]. Physical assessment deferred to PCP.  Hearing/Vision screen  Visual Acuity Screening   Right eye Left  eye Both eyes  Without correction:     With correction: 20/25 20/25 20/25     Hearing Screening Comments: Able to hear conversational tones w/ hearing aids.    Dietary issues and exercise activities discussed: Current Exercise Habits: The patient does not participate in regular exercise at present, Exercise limited by: orthopedic condition(s) Diet (meal preparation, eat out, water intake, caffeinated beverages, dairy products, fruits and vegetables): well balanced. Pt reports he should drink more water.        Goals    . Gain weight      Depression Screen PHQ 2/9 Scores 05/18/2018 09/22/2017 01/11/2015 05/28/2012  PHQ - 2 Score 0 0 0 1    Fall Risk Fall Risk  05/18/2018 09/22/2017 01/11/2015  Falls in the past year? Yes Yes Yes  Number falls in past yr: 2 or more 2 or more 1  Injury with Fall? No No No  Risk for fall due to : - History of fall(s) (No Data)  Risk for fall due to: Comment - - Syncope  Follow up Education provided;Falls prevention discussed - -    Cognitive Function: MMSE - Mini Mental State Exam 05/18/2018  Not completed: Refused        Screening Tests Health Maintenance  Topic Date Due  . TETANUS/TDAP  03/30/1950  . INFLUENZA VACCINE  06/17/2018  . PNA vac Low Risk Adult  Completed  . COLONOSCOPY  Discontinued      Plan:    Please schedule your next medicare wellness visit with me in 1 yr.  Continue to eat heart healthy diet (full of fruits, vegetables, whole grains, lean protein, water--limit salt, fat, and sugar intake) and increase physical activity as tolerated.  Try brain stimulating activities (puzzles, reading, adult coloring books, staying active) to keep memory sharp.     I have personally reviewed and noted the following in the patient's chart:   . Medical and social history . Use of alcohol, tobacco or illicit drugs  . Current medications and supplements . Functional ability and status . Nutritional status . Physical activity . Advanced directives . List of other physicians . Hospitalizations, surgeries, and ER  visits in previous 12 months . Vitals . Screenings to include cognitive, depression, and falls . Referrals and appointments  In addition, I have reviewed and discussed with patient certain preventive protocols, quality metrics, and best practice recommendations. A written personalized care plan for preventive services as well as general preventive health recommendations were provided to patient.     Shela Nevin, [place]   05/18/2018    Medical screening examination/treatment was performed by qualified clinical staff member and as supervising physician I was immediately available for consultation/collaboration. I have reviewed documentation and agree with assessment and plan.  Penni Homans, MD

## 2018-05-18 ENCOUNTER — Ambulatory Visit (INDEPENDENT_AMBULATORY_CARE_PROVIDER_SITE_OTHER): Payer: Medicare Other | Admitting: *Deleted

## 2018-05-18 ENCOUNTER — Encounter: Payer: Self-pay | Admitting: *Deleted

## 2018-05-18 VITALS — BP 100/52 | HR 56 | Ht 76.0 in | Wt 156.5 lb

## 2018-05-18 DIAGNOSIS — Z0000 Encounter for general adult medical examination without abnormal findings: Secondary | ICD-10-CM

## 2018-05-18 NOTE — Patient Instructions (Signed)
Please schedule your next medicare wellness visit with me in 1 yr.  Continue to eat heart healthy diet (full of fruits, vegetables, whole grains, lean protein, water--limit salt, fat, and sugar intake) and increase physical activity as tolerated.  Try brain stimulating activities (puzzles, reading, adult coloring books, staying active) to keep memory sharp.    [name] , Thank you for taking time to come for your Medicare Wellness Visit. I appreciate your ongoing commitment to your health goals. Please review the following plan we discussed and let me know if I can assist you in the future.   These are the goals we discussed: Goals    . Gain weight       This is a list of the screening recommended for you and due dates:  Health Maintenance  Topic Date Due  . Tetanus Vaccine  03/30/1950  . Flu Shot  06/17/2018  . Pneumonia vaccines  Completed  . Colon Cancer Screening  Discontinued    Health Maintenance, Male A healthy lifestyle and preventive care is important for your health and wellness. Ask your health care provider about what schedule of regular examinations is right for you. What should I know about weight and diet? Eat a Healthy Diet  Eat plenty of vegetables, fruits, whole grains, low-fat dairy products, and lean protein.  Do not eat a lot of foods high in solid fats, added sugars, or salt.  Maintain a Healthy Weight Regular exercise can help you achieve or maintain a healthy weight. You should:  Do at least 150 minutes of exercise each week. The exercise should increase your heart rate and make you sweat (moderate-intensity exercise).  Do strength-training exercises at least twice a week.  Watch Your Levels of Cholesterol and Blood Lipids  Have your blood tested for lipids and cholesterol every 5 years starting at 82 years of age. If you are at high risk for heart disease, you should start having your blood tested when you are 82 years old. You may need to have  your cholesterol levels checked more often if: ? Your lipid or cholesterol levels are high. ? You are older than 82 years of age. ? You are at high risk for heart disease.  What should I know about cancer screening? Many types of cancers can be detected early and may often be prevented. Lung Cancer  You should be screened every year for lung cancer if: ? You are a current smoker who has smoked for at least 30 years. ? You are a former smoker who has quit within the past 15 years.  Talk to your health care provider about your screening options, when you should start screening, and how often you should be screened.  Colorectal Cancer  Routine colorectal cancer screening usually begins at 82 years of age and should be repeated every 5-10 years until you are 82 years old. You may need to be screened more often if early forms of precancerous polyps or small growths are found. Your health care provider may recommend screening at an earlier age if you have risk factors for colon cancer.  Your health care provider may recommend using home test kits to check for hidden blood in the stool.  A small camera at the end of a tube can be used to examine your colon (sigmoidoscopy or colonoscopy). This checks for the earliest forms of colorectal cancer.  Prostate and Testicular Cancer  Depending on your age and overall health, your health care provider may do certain  tests to screen for prostate and testicular cancer.  Talk to your health care provider about any symptoms or concerns you have about testicular or prostate cancer.  Skin Cancer  Check your skin from head to toe regularly.  Tell your health care provider about any new moles or changes in moles, especially if: ? There is a change in a mole's size, shape, or color. ? You have a mole that is larger than a pencil eraser.  Always use sunscreen. Apply sunscreen liberally and repeat throughout the day.  Protect yourself by wearing long  sleeves, pants, a wide-brimmed hat, and sunglasses when outside.  What should I know about heart disease, diabetes, and high blood pressure?  If you are 82-84 years of age, have your blood pressure checked every 3-5 years. If you are 82 years of age or older, have your blood pressure checked every year. You should have your blood pressure measured twice-once when you are at a hospital or clinic, and once when you are not at a hospital or clinic. Record the average of the two measurements. To check your blood pressure when you are not at a hospital or clinic, you can use: ? An automated blood pressure machine at a pharmacy. ? A home blood pressure monitor.  Talk to your health care provider about your target blood pressure.  If you are between 82-82 years old, ask your health care provider if you should take aspirin to prevent heart disease.  Have regular diabetes screenings by checking your fasting blood sugar level. ? If you are at a normal weight and have a low risk for diabetes, have this test once every three years after the age of 82. ? If you are overweight and have a high risk for diabetes, consider being tested at a younger age or more often.  A one-time screening for abdominal aortic aneurysm (AAA) by ultrasound is recommended for men aged 52-75 years who are current or former smokers. What should I know about preventing infection? Hepatitis B If you have a higher risk for hepatitis B, you should be screened for this virus. Talk with your health care provider to find out if you are at risk for hepatitis B infection. Hepatitis C Blood testing is recommended for:  Everyone born from [birth year] through 1963.  Anyone with known risk factors for hepatitis C.  Sexually Transmitted Diseases (STDs)  You should be screened each year for STDs including gonorrhea and chlamydia if: ? You are sexually active and are younger than 82 years of age. ? You are older than 82 years of age and your  health care provider tells you that you are at risk for this type of infection. ? Your sexual activity has changed since you were last screened and you are at an increased risk for chlamydia or gonorrhea. Ask your health care provider if you are at risk.  Talk with your health care provider about whether you are at high risk of being infected with HIV. Your health care provider may recommend a prescription medicine to help prevent HIV infection.  What else can I do?  Schedule regular health, dental, and eye exams.  Stay current with your vaccines (immunizations).  Do not use any tobacco products, such as cigarettes, chewing tobacco, and e-cigarettes. If you need help quitting, ask your health care provider.  Limit alcohol intake to no more than 2 drinks per day. One drink equals 12 ounces of beer, 5 ounces of wine, or 1 ounces of hard  liquor.  Do not use street drugs.  Do not share needles.  Ask your health care provider for help if you need support or information about quitting drugs.  Tell your health care provider if you often feel depressed.  Tell your health care provider if you have ever been abused or do not feel safe at home. This information is not intended to replace advice given to you by your health care provider. Make sure you discuss any questions you have with your health care provider. Document Released: 05/01/2008 Document Revised: 07/02/2016 Document Reviewed: 08/07/2015 Elsevier Interactive Patient Education  [employer].

## 2018-06-10 ENCOUNTER — Ambulatory Visit (INDEPENDENT_AMBULATORY_CARE_PROVIDER_SITE_OTHER): Payer: Medicare Other | Admitting: *Deleted

## 2018-06-10 DIAGNOSIS — I4891 Unspecified atrial fibrillation: Secondary | ICD-10-CM

## 2018-06-10 DIAGNOSIS — Z5181 Encounter for therapeutic drug level monitoring: Secondary | ICD-10-CM | POA: Diagnosis not present

## 2018-06-10 LAB — POCT INR: INR: 1.9 — AB (ref 2.0–3.0)

## 2018-06-10 NOTE — Patient Instructions (Signed)
Description   Take 1 tablet today, then resume same dosage 1/2 tablet daily. Recheck in 3 weeks. Call [phone] if scheduled for any procedures or on any new medications

## 2018-06-17 ENCOUNTER — Encounter: Payer: Self-pay | Admitting: Cardiology

## 2018-06-17 ENCOUNTER — Ambulatory Visit (INDEPENDENT_AMBULATORY_CARE_PROVIDER_SITE_OTHER): Payer: Medicare Other | Admitting: Cardiology

## 2018-06-17 VITALS — BP 98/58 | HR 54 | Ht 76.0 in | Wt 163.0 lb

## 2018-06-17 DIAGNOSIS — I5032 Chronic diastolic (congestive) heart failure: Secondary | ICD-10-CM

## 2018-06-17 MED ORDER — DILTIAZEM HCL ER COATED BEADS 300 MG PO CP24: 300.0000 mg | ORAL_CAPSULE | Freq: Every day | ORAL | 3 refills | Status: DC

## 2018-06-17 MED ORDER — METOPROLOL SUCCINATE ER 25 MG PO TB24: 25.0000 mg | ORAL_TABLET | Freq: Every day | ORAL | 3 refills | Status: DC

## 2018-06-17 NOTE — Patient Instructions (Signed)
Medication Instructions:  Your physician has recommended you make the following change in your medication:   1. Decrease your Metoprolol to 25mg , one tablet, daily  Labwork: None ordered.  Testing/Procedures: None ordered.  Follow-Up: Your physician wants you to follow-up in: Dr Irish Lack in one year. You will receive a reminder letter in the mail two months in advance. If you don't receive a letter, please call our office to schedule the follow-up appointment.   Any Other Special Instructions Will Be Listed Below (If Applicable).     If you need a refill on your cardiac medications before your next appointment, please call your pharmacy.

## 2018-06-17 NOTE — Progress Notes (Signed)
06/17/2018 [name] [name]   23-Jun-1931  [id number]  Primary Physician [name], MD Primary Cardiologist: Dr. Irish Lack [facility]: Dr. Haroldine Laws   Reason for Visit/CC: f/u for chronic HF, Valvular heart disease and PAF  HPI:  [name] [name] is a 84 y.o. male  is an 84 year old male, previously followed by Dr. Mare Ferrari.  He is now followed by Dr. Lorra Hals seen presents to clinic today for his yearly cardiovascular examination.  His past cardiac history is notable for paroxysmal atrial fibrillation/flutter.  He is on chronic anticoagulation therapy with Coumadin.  Also on anti-rhythmic drug therapy and takes amiodarone. he also has a history of severe mitral valve prolapse and underwent mitral valve repair by Dr. Loetta Rough in November 2012.  Her to his valve repair, he underwent diagnostic left heart catheterization by Dr. Martinique which showed normal coronary anatomy.  He is also now being followed by the advanced heart failure clinic.  He sees Dr. Haroldine Laws.  He was referred to him in 2018 worsening peripheral edema felt related to RV failure.  Right heart cath which showed normal cardiac output.  No evidence of PAH.  Recent echocardiogram October 2018 showed normal left ventricular EF of 55 to 60%, mile MR, severe tricuspid regurgitation and moderately to severely dilated right ventricle.  Dr. Haroldine Laws feels that he is not a good candidate for tricuspid valve repair/placement.  In clinic, he reports that he is done fairly well since his last office visit.  He denies any dyspnea.  He reports full compliance with daily weights at home.  He notes that his weight has remained stable between 158 and 162 pounds at home.  He takes additional torsemide if needed.  He denies any chest pain.  No palpitations.  He reports full medication compliance.  No abnormal bleeding with Coumadin.  He denies any falls.  His blood pressure today is a little soft at 98/58.  He reports that his usual baseline blood pressure at  home is in the low 654 systolic and 65K diastolic.  He is completely asymptomatic denying any dizziness, lightheadedness, syncope/near syncope.  He did take all of his medications this morning around 8 AM prior to this appointment.     Cardiac Studies Left Heart catheterization October 2012 FINAL ASSESSMENT: 1. Normal coronary anatomy. 2. Normal left ventricular function. 3. Severe mitral insufficiency. 4. Mild-to-moderate pulmonary hypertension.   2D echo 08/2017 Study Conclusions  - Left ventricle: The cavity size was normal. Systolic function was   normal. The estimated ejection fraction was in the range of 60%   to 65%. Wall motion was normal; there were no regional wall   motion abnormalities. Features are consistent with a pseudonormal   left ventricular filling pattern, with concomitant abnormal   relaxation and increased filling pressure (grade 2 diastolic   dysfunction). Doppler parameters are consistent with high   ventricular filling pressure. - Ventricular septum: The contour showed diastolic flattening. - Aortic valve: Transvalvular velocity was within the normal range.   There was no stenosis. There was mild regurgitation. - Mitral valve: Prior procedures included surgical repair. There   was mild regurgitation. - Left atrium: The atrium was moderately dilated. - Right ventricle: The cavity size was severely dilated. Wall   thickness was normal. Systolic function was normal. - Right atrium: The atrium was severely dilated. - Tricuspid valve: There was moderate-severe regurgitation. - Pulmonary arteries: Systolic pressure was within the normal   range. PA peak pressure: 25 mm Hg (S). - Global longitudinal  strain -19.7%.  Current Meds  Medication Sig  . amiodarone (PACERONE) 200 MG tablet Take 1 tablet (200 mg total) by mouth daily.  Marland Kitchen b complex vitamins tablet Take 1 tablet by mouth daily.  . cycloSPORINE (RESTASIS) 0.05 % ophthalmic emulsion Place 1 drop  into both eyes 2 (two) times daily.  Marland Kitchen diltiazem (CARDIZEM CD) 300 MG 24 hr capsule Take 1 capsule (300 mg total) by mouth daily.  . fluticasone (FLONASE) 50 MCG/ACT nasal spray SHAKE LQ AND U 2 SPRAYS IEN D  . gabapentin (NEURONTIN) 300 MG capsule Take 1 capsule (300 mg total) by mouth 2 (two) times daily.  Marland Kitchen glucosamine-chondroitin 500-400 MG tablet Take 1 tablet by mouth 2 (two) times daily.  Marland Kitchen LINZESS 145 MCG CAPS capsule   . metoprolol succinate (TOPROL-XL) 25 MG 24 hr tablet Take 1 tablet (25 mg total) by mouth daily.  . Multiple Vitamin (MULTIVITAMIN WITH MINERALS) TABS tablet Take 1 tablet by mouth every evening.   Marland Kitchen MYRBETRIQ 50 MG TB24 tablet Take 1 tablet by mouth daily.  . potassium chloride SA (K-DUR,KLOR-CON) 20 MEQ tablet Take 2 tablets (40 mEq total) by mouth daily.  . rosuvastatin (CRESTOR) 5 MG tablet TAKE 1 TABLET DAILY  . tolnaftate (ANTIFUNGAL SPRAY POWDER) 1 % spray Apply topically 2 (two) times daily.  Marland Kitchen torsemide (DEMADEX) 20 MG tablet Take 2 tablets (40 mg total) by mouth daily. Take extra 20 mg tablet once daily as needed for 3 lbs or more weight gain.  Marland Kitchen traZODone (DESYREL) 100 MG tablet Take 300 mg by mouth at bedtime.   Marland Kitchen warfarin (COUMADIN) 5 MG tablet TAKE 1 TABLET DAILY AS DIRECTED BY THE COUMADIN CLINIC  . Wheat Dextrin (BENEFIBER PO) Take by mouth.  . [DISCONTINUED] diltiazem (CARDIZEM CD) 300 MG 24 hr capsule Take 1 capsule (300 mg total) by mouth daily.  . [DISCONTINUED] metoprolol succinate (TOPROL-XL) 50 MG 24 hr tablet TAKE 1 TABLET DAILY, TAKE WITH OR IMMEDIATELY FOLLOWING A MEAL   No Known Allergies Past Medical History:  Diagnosis Date  . Abnormality of gait 05/27/2016  . Adenomatous polyps   . Carpal tunnel syndrome 06/25/2016   Right  . Depressive disorder, not elsewhere classified   . First degree AV block    Holter 3/18: NSR, PACs, PVCs, no AFib, no pauses.  Marland Kitchen Hereditary and idiopathic peripheral neuropathy 06/25/2016  . Hypertension   .  Internal nasal lesion 05/15/2013  . Melanoma ([place])    Left Shoulder  . Mitral regurgitation   . MVP (mitral valve prolapse)    a. With severe MR s/p Complex valvuloplasty including artificial Gore-tex neochord placement x4, chordal transposition x1, chordal release x1, # 32 mm Sorin Memo 3D Ring Annuloplasty 2012. // b. Echo 2/18: mild LVH, EF 50-55, mild AI, MV repair with mild MR, mod LAE, mod RVE, severe RAE, severe TR  . Neuropathy   . Normal coronary arteries    a. Normal coronary anatomy by cath 2012.  . Osteoarthritis    Knees  . PAF (paroxysmal atrial fibrillation) ([place])    a. Post-op MVR 2012.  Marland Kitchen Personal history of colonic polyps   . Prostate cancer ([place])   . Pulmonary HTN ([place])    a. Mild-mod by cath 2012.  . Pure hypercholesterolemia   . PVC (premature ventricular contraction)   . Thrombocytopenia ([place])   . Vision abnormalities    Cornea scarring   Family History  Problem Relation Age of Onset  . Clotting disorder Brother  CVA's  . Arthritis Mother   . Hypertension Mother   . Stroke Mother   . Hypertension Father   . Psychosis Father        psychiatric care  . Colon cancer Neg Hx   . Stomach cancer Neg Hx   . Heart attack Neg Hx    Past Surgical History:  Procedure Laterality Date  . CARDIAC CATHETERIZATION  09/2011   Pre-op for MVR -- normal coronaries.  Marland Kitchen CARDIOVERSION N/A 01/02/2016   Procedure: CARDIOVERSION;  Surgeon: Thayer Headings, MD;  Location: [facility] ENDOSCOPY;  Service: Cardiovascular;  Laterality: N/A;  . COLONOSCOPY W/ POLYPECTOMY    . INGUINAL HERNIA REPAIR  09/2009   Left  . KNEE ARTHROSCOPY      left x3  and right x2  . Melanoma Surgery     2001, 2005, 2006, 2009  . MITRAL VALVE REPAIR  10/01/2011   complex valvuloplasty with Goretex cord replacement and chordal transposition 67mm Sorin Memo 3D ring annuloplasty  . Nuclear Stress Test  09/2006   EF-64%, Normal  . PROSTATECTOMY  1993  . RIGHT HEART CATH N/A 04/29/2017   Procedure:  Right Heart Cath;  Surgeon: Jolaine Artist, MD;  Location: [place] CV LAB;  Service: Cardiovascular;  Laterality: N/A;  . ROOT CANAL  08-19-12  . ROTATOR CUFF REPAIR  2003   left  . TEE WITHOUT CARDIOVERSION  09/26/2011   Procedure: TRANSESOPHAGEAL ECHOCARDIOGRAM (TEE);  Surgeon: Lelon Perla, MD;  Location: [facility] ENDOSCOPY;  Service: Cardiovascular;  Laterality: N/A;  . US ECHOCARDIOGRAPHY  09/2009, 08/1011   mild LVH,mild AI,MVP with mild MR, mild-mod. TR with mild Pulm. HTN, EF-55-60%   Social History   Socioeconomic History  . Marital status: Widowed    Spouse name: Not on file  . Number of children: 2  . Years of education: 49  . Highest education level: Not on file  Occupational History  . Not on file  Social Needs  . Financial resource strain: Not on file  . Food insecurity:    Worry: Not on file    Inability: Not on file  . Transportation needs:    Medical: Not on file    Non-medical: Not on file  Tobacco Use  . Smoking status: Never Smoker  . Smokeless tobacco: Never Used  Substance and Sexual Activity  . Alcohol use: No    Alcohol/week: 0.0 oz    Comment: Last drink in 2000  . Drug use: No  . Sexual activity: Not on file  Lifestyle  . Physical activity:    Days per week: Not on file    Minutes per session: Not on file  . Stress: Not on file  Relationships  . Social connections:    Talks on phone: Not on file    Gets together: Not on file    Attends religious service: Not on file    Active member of club or organization: Not on file    Attends meetings of clubs or organizations: Not on file    Relationship status: Not on file  . Intimate partner violence:    Fear of current or ex partner: Not on file    Emotionally abused: Not on file    Physically abused: Not on file    Forced sexual activity: Not on file  Other Topics Concern  . Not on file  Social History Narrative   Retired - Optometrist   Widower   2 children (one in [place] and on one  in [place])    Drinks 1 cup of coffee per day     Review of Systems: General: negative for chills, fever, night sweats or weight changes.  Cardiovascular: negative for chest pain, dyspnea on exertion, edema, orthopnea, palpitations, paroxysmal nocturnal dyspnea or shortness of breath Dermatological: negative for rash Respiratory: negative for cough or wheezing Urologic: negative for hematuria Abdominal: negative for nausea, vomiting, diarrhea, bright red blood per rectum, melena, or hematemesis Neurologic: negative for visual changes, syncope, or dizziness All other systems reviewed and are otherwise negative except as noted above.   Physical Exam:  Blood pressure (!) 98/58, pulse (!) 54, height 6\' 4"  (1.93 m), weight 163 lb (73.9 kg), SpO2 92 %.  General appearance: alert, cooperative and no distress Neck: no carotid bruit and no JVD Lungs: clear to auscultation bilaterally Heart: regular rate and rhythm, S1, S2 normal, no murmur, click, rub or gallop Extremities: trace bilateral LEE, compression stockings bilaterally Pulses: 2+ and symmetric Skin: Skin color, texture, turgor normal. No rashes or lesions Neurologic: Grossly normal  EKG not performed -- personally reviewed   ASSESSMENT AND PLAN:   1. Chronic Diastolic HF/ RV Failure: Patient stable from a symptom and volume standpoint.  He reports full medication compliance with torsemide and takes extra torsemide as needed based on daily weights.  He reports that his daily weights have remained stable at home between 158 and 162 pounds.  He denies any dyspnea.  There is trace bilateral lower extremity edema on exam.  He is wearing compression stockings.  He had recent laboratory work done by PCP which showed stable creatinine at 1.5 and normal potassium at 4.4.  His lungs are clear to auscultation bilaterally.  Blood pressure is soft but stable.  We will continue his current diuretic regimen.  He has follow-up with Dr. Haroldine Laws in  September.  2.  History of mitral valve repair: Performed by Dr. Roxy Manns in 2012 for severe mitral regurgitation.  His last echocardiogram October 2018 showed mild MR.  3.  Severe tricuspid regurgitation: Per Dr. Haroldine Laws, he is not a candidate for valve surgery.  4. PAF: Regular Rate and rhythm on exam.  His last EKG May 2019 showed normal sinus rhythm.  He is on amiodarone plus metoprolol and Cardizem. Recent labs 03/2018 showed normal TSH and HFTs.  He denies any palpitations and dyspnea.  HR is 54 bpm. He is on Coumadin for anticoagulation.  His INRs are followed in our Coumadin clinic.  He denies any abnormal bleeding or falls.  Given soft blood pressure and baseline heart rate in the low 50s we will reduce metoprolol down to 25 mg daily.  5. Soft BP: Systolic blood pressure reading in the upper 90s.  He is asymptomatic but he is elderly, requires diuretics for management of chronic heart failure and is also on Coumadin for stroke prophylaxis.  To reduce his risk for falls, I will decrease his metoprolol down to 25 mg daily for more blood pressure room.  He will notify our office if he develops any orthostatic symptoms.  He will also monitor his blood pressure at home.   Follow-Up in CHF clinic in September. Dr. Irish Lack in 1 year.   Brittainy Ladoris Gene, MHS [facility] 06/17/2018 12:51 PM

## 2018-06-30 ENCOUNTER — Ambulatory Visit (INDEPENDENT_AMBULATORY_CARE_PROVIDER_SITE_OTHER): Payer: Medicare Other | Admitting: Pharmacist

## 2018-06-30 DIAGNOSIS — I4891 Unspecified atrial fibrillation: Secondary | ICD-10-CM | POA: Diagnosis not present

## 2018-06-30 DIAGNOSIS — Z5181 Encounter for therapeutic drug level monitoring: Secondary | ICD-10-CM | POA: Diagnosis not present

## 2018-06-30 LAB — POCT INR: INR: 2.1 (ref 2.0–3.0)

## 2018-06-30 NOTE — Patient Instructions (Signed)
Description   Continue taking 1/2 tablet daily. Recheck in 4 weeks. Call [phone] if scheduled for any procedures or on any new medications

## 2018-07-02 ENCOUNTER — Other Ambulatory Visit: Payer: Self-pay | Admitting: Interventional Cardiology

## 2018-07-28 ENCOUNTER — Ambulatory Visit (INDEPENDENT_AMBULATORY_CARE_PROVIDER_SITE_OTHER): Payer: Medicare Other | Admitting: *Deleted

## 2018-07-28 DIAGNOSIS — I4891 Unspecified atrial fibrillation: Secondary | ICD-10-CM | POA: Diagnosis not present

## 2018-07-28 DIAGNOSIS — Z5181 Encounter for therapeutic drug level monitoring: Secondary | ICD-10-CM

## 2018-07-28 LAB — POCT INR: INR: 2 (ref 2.0–3.0)

## 2018-07-28 NOTE — Patient Instructions (Signed)
Description   Today take 1 tablet then continue taking 1/2 tablet daily. Recheck in 4 weeks. Call [phone] if scheduled for any procedures or on any new medications

## 2018-08-01 ENCOUNTER — Other Ambulatory Visit: Payer: Self-pay | Admitting: Cardiology

## 2018-08-05 ENCOUNTER — Encounter: Payer: Self-pay | Admitting: Family Medicine

## 2018-08-05 ENCOUNTER — Ambulatory Visit (INDEPENDENT_AMBULATORY_CARE_PROVIDER_SITE_OTHER): Payer: Medicare Other | Admitting: Family Medicine

## 2018-08-05 VITALS — BP 114/60 | HR 58 | Temp 97.5°F | Resp 18 | Ht 75.0 in | Wt 159.6 lb

## 2018-08-05 DIAGNOSIS — E7800 Pure hypercholesterolemia, unspecified: Secondary | ICD-10-CM | POA: Diagnosis not present

## 2018-08-05 DIAGNOSIS — R600 Localized edema: Secondary | ICD-10-CM

## 2018-08-05 DIAGNOSIS — N289 Disorder of kidney and ureter, unspecified: Secondary | ICD-10-CM | POA: Diagnosis not present

## 2018-08-05 DIAGNOSIS — K5909 Other constipation: Secondary | ICD-10-CM

## 2018-08-05 DIAGNOSIS — M25561 Pain in right knee: Secondary | ICD-10-CM

## 2018-08-05 DIAGNOSIS — I4891 Unspecified atrial fibrillation: Secondary | ICD-10-CM

## 2018-08-05 DIAGNOSIS — Z23 Encounter for immunization: Secondary | ICD-10-CM | POA: Diagnosis not present

## 2018-08-05 DIAGNOSIS — M25562 Pain in left knee: Secondary | ICD-10-CM

## 2018-08-05 DIAGNOSIS — G8929 Other chronic pain: Secondary | ICD-10-CM

## 2018-08-05 DIAGNOSIS — N189 Chronic kidney disease, unspecified: Secondary | ICD-10-CM

## 2018-08-05 DIAGNOSIS — I10 Essential (primary) hypertension: Secondary | ICD-10-CM

## 2018-08-05 LAB — COMPREHENSIVE METABOLIC PANEL
ALK PHOS: 64 U/L (ref 39–117)
ALT: 23 U/L (ref 0–53)
AST: 23 U/L (ref 0–37)
Albumin: 4.2 g/dL (ref 3.5–5.2)
BILIRUBIN TOTAL: 0.7 mg/dL (ref 0.2–1.2)
BUN: 27 mg/dL — AB (ref 6–23)
CO2: 28 meq/L (ref 19–32)
Calcium: 9.7 mg/dL (ref 8.4–10.5)
Chloride: 104 mEq/L (ref 96–112)
Creatinine, Ser: 1.16 mg/dL (ref 0.40–1.50)
GFR: 63.55 mL/min (ref >60.00)
GLUCOSE: 82 mg/dL (ref 70–99)
POTASSIUM: 4.4 meq/L (ref 3.5–5.1)
SODIUM: 140 meq/L (ref 135–145)
TOTAL PROTEIN: 7.2 g/dL (ref 6.0–8.3)

## 2018-08-05 NOTE — Patient Instructions (Signed)
Lidocaine gel by Aspercreme, Icy Hot or [place] all make these Consider mixing with a menthol based cream such as Blue Emu if pain persists.  Encouraged increased hydration and fiber in diet. Daily probiotics. If bowels not moving can use MOM 2 tbls po in 4 oz of warm prune juice by mouth every 2-3 days. If no results then repeat in 4 hours with  Dulcolax suppository pr, may repeat again in 4 more hours as needed. Seek care if symptoms worsen. Consider daily Miralax and/or Dulcolax if symptoms persist.   Miralax and Benefiber together once or twice a day.  Knee Pain, Adult Knee pain in adults is common. It can be caused by many things, including:  Arthritis.  A fluid-filled sac (cyst) or growth in your knee.  An infection in your knee.  A    n injury that will not heal.  Damage, swelling, or irritation of the tissues that support your knee.  Knee pain is usually not a sign of a serious problem. The pain may go away on its own with time and rest. If it does not, a health care provider may order tests to find the cause of the pain. These may include:  Imaging tests, such as an X-ray, MRI, or ultrasound.  Joint aspiration. In this test, fluid is removed from the knee.  Arthroscopy. In this test, a lighted tube is inserted into knee and an image is projected onto a TV screen.  A biopsy. In this test, a sample of tissue is removed from the body and studied under a microscope.  Follow these instructions at home: Pay attention to any changes in your symptoms. Take these actions to relieve your pain. Activity  Rest your knee.  Do not do things that cause pain or make pain worse.  Avoid high-impact activities or exercises, such as running, jumping rope, or doing jumping jacks. General instructions  Take over-the-counter and prescription medicines only as told by your health care provider.  Raise (elevate) your knee above the level of your heart when you are sitting  or lying down.  Sleep with a pillow under your knee.  If directed, apply ice to the knee: ? Put ice in a plastic bag. ? Place a towel between your skin and the bag. ? Leave the ice on for 20 minutes, 2-3 times a day.  Ask your health care provider if you should wear an elastic knee support.  Lose weight if you are overweight. Extra weight can put pressure on your knee.  Do not use any products that contain nicotine or tobacco, such as cigarettes and e-cigarettes. Smoking may slow the healing of any bone and joint problems that you may have. If you need help quitting, ask your health care provider. Contact a health care provider if:  Your knee pain continues, changes, or gets worse.  You have a fever along with knee pain.  Your knee buckles or locks up.  Your knee swells, and the swelling becomes worse. Get help right away if:  Your knee feels warm to the touch.  You cannot move your knee.  You have severe pain in your knee.  You have chest pain.  You have trouble breathing. Summary  Knee pain in adults is common. It can be caused by many things, including, arthritis, infection, cysts, or injury.  Knee pain is usually not a sign of a serious problem, but if it does not go away, a health care provider may perform tests to  know the cause of the pain.  Pay attention to any changes in your symptoms. Relieve your pain with rest, medicines, light activity, and use of ice.  Get help if your pain continues or becomes very severe, or if your knee buckles or locks up, or if you have chest pain or trouble breathing. This information is not intended to replace advice given to you by your health care provider. Make sure you discuss any questions you have with your health care provider. Document Released: 08/31/2007 Document Revised: 10/24/2016 Document Reviewed: 10/24/2016 Elsevier Interactive Patient Education  [employer].

## 2018-08-08 DIAGNOSIS — M25561 Pain in right knee: Secondary | ICD-10-CM | POA: Insufficient documentation

## 2018-08-08 DIAGNOSIS — M25562 Pain in left knee: Secondary | ICD-10-CM

## 2018-08-08 NOTE — Progress Notes (Signed)
Subjective:    Patient ID: [name] [name], male    DOB: July 28, 1931, 84 y.o.   MRN: [id number]  No chief complaint on file.   HPI Patient is in today for follow up. He feels well today. He is pleased with his response to Linzess. His bowels are moving much better. No abdominal pain or bloody stool. His edema is also better with compression hose. No recent febrile illness or hospitalizations. He notes ongoing debility from knee pain but no fall or trauma. Denies CP/palp/SOB/HA/congestion/fevers/GI or GU c/o. Taking meds as prescribed  Past Medical History:  Diagnosis Date  . Abnormality of gait 05/27/2016  . Adenomatous polyps   . Carpal tunnel syndrome 06/25/2016   Right  . Depressive disorder, not elsewhere classified   . First degree AV block    Holter 3/18: NSR, PACs, PVCs, no AFib, no pauses.  Marland Kitchen Hereditary and idiopathic peripheral neuropathy 06/25/2016  . Hypertension   . Internal nasal lesion 05/15/2013  . Melanoma ([place])    Left Shoulder  . Mitral regurgitation   . MVP (mitral valve prolapse)    a. With severe MR s/p Complex valvuloplasty including artificial Gore-tex neochord placement x4, chordal transposition x1, chordal release x1, # 32 mm Sorin Memo 3D Ring Annuloplasty 2012. // b. Echo 2/18: mild LVH, EF 50-55, mild AI, MV repair with mild MR, mod LAE, mod RVE, severe RAE, severe TR  . Neuropathy   . Normal coronary arteries    a. Normal coronary anatomy by cath 2012.  . Osteoarthritis    Knees  . PAF (paroxysmal atrial fibrillation) ([place])    a. Post-op MVR 2012.  Marland Kitchen Personal history of colonic polyps   . Prostate cancer ([place])   . Pulmonary HTN ([place])    a. Mild-mod by cath 2012.  . Pure hypercholesterolemia   . PVC (premature ventricular contraction)   . Thrombocytopenia ([place])   . Vision abnormalities    Cornea scarring    Past Surgical History:  Procedure Laterality Date  . CARDIAC CATHETERIZATION  09/2011   Pre-op for MVR -- normal coronaries.  Marland Kitchen  CARDIOVERSION N/A 01/02/2016   Procedure: CARDIOVERSION;  Surgeon: Thayer Headings, MD;  Location: [facility] ENDOSCOPY;  Service: Cardiovascular;  Laterality: N/A;  . COLONOSCOPY W/ POLYPECTOMY    . INGUINAL HERNIA REPAIR  09/2009   Left  . KNEE ARTHROSCOPY      left x3  and right x2  . Melanoma Surgery     2001, 2005, 2006, 2009  . MITRAL VALVE REPAIR  10/01/2011   complex valvuloplasty with Goretex cord replacement and chordal transposition 75mm Sorin Memo 3D ring annuloplasty  . Nuclear Stress Test  09/2006   EF-64%, Normal  . PROSTATECTOMY  1993  . RIGHT HEART CATH N/A 04/29/2017   Procedure: Right Heart Cath;  Surgeon: Jolaine Artist, MD;  Location: [facility] CV LAB;  Service: Cardiovascular;  Laterality: N/A;  . ROOT CANAL  08-19-12  . ROTATOR CUFF REPAIR  2003   left  . TEE WITHOUT CARDIOVERSION  09/26/2011   Procedure: TRANSESOPHAGEAL ECHOCARDIOGRAM (TEE);  Surgeon: Lelon Perla, MD;  Location: [facility] ENDOSCOPY;  Service: Cardiovascular;  Laterality: N/A;  . US ECHOCARDIOGRAPHY  09/2009, 08/1011   mild LVH,mild AI,MVP with mild MR, mild-mod. TR with mild Pulm. HTN, EF-55-60%    Family History  Problem Relation Age of Onset  . Clotting disorder Brother        CVA's  . Arthritis Mother   . Hypertension Mother   .  Stroke Mother   . Hypertension Father   . Psychosis Father        psychiatric care  . Colon cancer Neg Hx   . Stomach cancer Neg Hx   . Heart attack Neg Hx     Social History   Socioeconomic History  . Marital status: Widowed    Spouse name: Not on file  . Number of children: 2  . Years of education: 41  . Highest education level: Not on file  Occupational History  . Not on file  Social Needs  . Financial resource strain: Not on file  . Food insecurity:    Worry: Not on file    Inability: Not on file  . Transportation needs:    Medical: Not on file    Non-medical: Not on file  Tobacco Use  . Smoking status: Never Smoker  . Smokeless tobacco:  Never Used  Substance and Sexual Activity  . Alcohol use: No    Alcohol/week: 0.0 standard drinks    Comment: Last drink in 2000  . Drug use: No  . Sexual activity: Not on file  Lifestyle  . Physical activity:    Days per week: Not on file    Minutes per session: Not on file  . Stress: Not on file  Relationships  . Social connections:    Talks on phone: Not on file    Gets together: Not on file    Attends religious service: Not on file    Active member of club or organization: Not on file    Attends meetings of clubs or organizations: Not on file    Relationship status: Not on file  . Intimate partner violence:    Fear of current or ex partner: Not on file    Emotionally abused: Not on file    Physically abused: Not on file    Forced sexual activity: Not on file  Other Topics Concern  . Not on file  Social History Narrative   Retired - Optometrist   Widower   2 children (one in [place] and on one in [place])    Drinks 1 cup of coffee per day    Outpatient Medications Prior to Visit  Medication Sig Dispense Refill  . amiodarone (PACERONE) 200 MG tablet TAKE 1 TABLET DAILY 30 tablet 12  . b complex vitamins tablet Take 1 tablet by mouth daily.    . cycloSPORINE (RESTASIS) 0.05 % ophthalmic emulsion Place 1 drop into both eyes 2 (two) times daily.    Marland Kitchen diltiazem (CARDIZEM CD) 300 MG 24 hr capsule Take 1 capsule (300 mg total) by mouth daily. 90 capsule 3  . fluticasone (FLONASE) 50 MCG/ACT nasal spray SHAKE LQ AND U 2 SPRAYS IEN D  11  . gabapentin (NEURONTIN) 300 MG capsule Take 1 capsule (300 mg total) by mouth 2 (two) times daily. 180 capsule 3  . glucosamine-chondroitin 500-400 MG tablet Take 1 tablet by mouth 2 (two) times daily.    Marland Kitchen LINZESS 145 MCG CAPS capsule   6  . metoprolol succinate (TOPROL-XL) 25 MG 24 hr tablet Take 1 tablet (25 mg total) by mouth daily. 90 tablet 3  . Multiple Vitamin (MULTIVITAMIN WITH MINERALS) TABS tablet Take 1 tablet by mouth every  evening.     Marland Kitchen MYRBETRIQ 50 MG TB24 tablet Take 1 tablet by mouth daily.    . potassium chloride SA (K-DUR,KLOR-CON) 20 MEQ tablet Take 2 tablets (40 mEq total) by mouth daily. 180 tablet 3  .  rosuvastatin (CRESTOR) 5 MG tablet TAKE 1 TABLET DAILY 90 tablet 3  . tolnaftate (ANTIFUNGAL SPRAY POWDER) 1 % spray Apply topically 2 (two) times daily. 130 g 0  . torsemide (DEMADEX) 20 MG tablet Take 2 tablets (40 mg total) by mouth daily. Take extra 20 mg tablet once daily as needed for 3 lbs or more weight gain. 180 tablet 3  . traZODone (DESYREL) 100 MG tablet Take 300 mg by mouth at bedtime.     Marland Kitchen warfarin (COUMADIN) 5 MG tablet TAKE 1 TABLET DAILY AS DIRECTED BY THE COUMADIN CLINIC 60 tablet 1  . Wheat Dextrin (BENEFIBER PO) Take by mouth.     No facility-administered medications prior to visit.     No Known Allergies  Review of Systems  Constitutional: Positive for malaise/fatigue. Negative for fever.  HENT: Negative for congestion.   Eyes: Negative for blurred vision.  Respiratory: Negative for shortness of breath.   Cardiovascular: Negative for chest pain, palpitations and leg swelling.  Gastrointestinal: Negative for abdominal pain, blood in stool and nausea.  Genitourinary: Negative for dysuria and frequency.  Musculoskeletal: Positive for joint pain. Negative for falls.  Skin: Negative for rash.  Neurological: Negative for dizziness, loss of consciousness and headaches.  Endo/Heme/Allergies: Negative for environmental allergies.  Psychiatric/Behavioral: Negative for depression. The patient is not nervous/anxious.        Objective:    Physical Exam  Constitutional: He is oriented to person, place, and time. He appears well-developed and well-nourished. No distress.  HENT:  Head: Normocephalic and atraumatic.  Nose: Nose normal.  Eyes: Right eye exhibits no discharge. Left eye exhibits no discharge.  Neck: Normal range of motion. Neck supple.  Cardiovascular: Normal rate.    Murmur heard. Pulmonary/Chest: Effort normal and breath sounds normal.  Abdominal: Soft. Bowel sounds are normal. There is no tenderness.  Musculoskeletal: He exhibits no edema.  Neurological: He is alert and oriented to person, place, and time.  Skin: Skin is warm and dry.  Psychiatric: He has a normal mood and affect.  Nursing note and vitals reviewed.   BP 114/60 (BP Location: Left Arm, Patient Position: Sitting, Cuff Size: Normal)   Pulse (!) 58   Temp (!) 97.5 F (36.4 C) (Oral)   Resp 18   Ht 6\' 3"  (1.905 m)   Wt 159 lb 9.6 oz (72.4 kg)   SpO2 98%   BMI 19.95 kg/m  Wt Readings from Last 3 Encounters:  08/05/18 159 lb 9.6 oz (72.4 kg)  06/17/18 163 lb (73.9 kg)  05/18/18 156 lb 8 oz (71 kg)     Lab Results  Component Value Date   WBC 8.5 05/11/2018   HGB 13.3 05/11/2018   HCT 41.8 05/11/2018   PLT 78 (L) 05/11/2018   GLUCOSE 82 08/05/2018   CHOL 103 11/25/2017   TRIG 60.0 11/25/2017   HDL 46.80 11/25/2017   LDLCALC 44 11/25/2017   ALT 23 08/05/2018   AST 23 08/05/2018   NA 140 08/05/2018   K 4.4 08/05/2018   CL 104 08/05/2018   CREATININE 1.16 08/05/2018   BUN 27 (H) 08/05/2018   CO2 28 08/05/2018   TSH 1.235 04/16/2018   PSA 0.07 (L) 09/17/2010   INR 2.0 07/28/2018   HGBA1C 6.0 (H) 04/03/2014    Lab Results  Component Value Date   TSH 1.235 04/16/2018   Lab Results  Component Value Date   WBC 8.5 05/11/2018   HGB 13.3 05/11/2018   HCT 41.8 05/11/2018  MCV 92.1 05/11/2018   PLT 78 (L) 05/11/2018   Lab Results  Component Value Date   NA 140 08/05/2018   K 4.4 08/05/2018   CO2 28 08/05/2018   GLUCOSE 82 08/05/2018   BUN 27 (H) 08/05/2018   CREATININE 1.16 08/05/2018   BILITOT 0.7 08/05/2018   ALKPHOS 64 08/05/2018   AST 23 08/05/2018   ALT 23 08/05/2018   PROT 7.2 08/05/2018   ALBUMIN 4.2 08/05/2018   CALCIUM 9.7 08/05/2018   ANIONGAP 10 04/16/2018   GFR 63.55 08/05/2018   Lab Results  Component Value Date   CHOL 103 11/25/2017    Lab Results  Component Value Date   HDL 46.80 11/25/2017   Lab Results  Component Value Date   LDLCALC 44 11/25/2017   Lab Results  Component Value Date   TRIG 60.0 11/25/2017   Lab Results  Component Value Date   CHOLHDL 2 11/25/2017   Lab Results  Component Value Date   HGBA1C 6.0 (H) 04/03/2014       Assessment & Plan:   Problem List Items Addressed This Visit    Atrial fibrillation (HCC) (Chronic)    Rate controlled, tolerating coumadin      HYPERCHOLESTEROLEMIA    Encouraged heart healthy diet, increase exercise, avoid trans fats, consider a krill oil cap daily      HYPERTENSION, BENIGN ESSENTIAL    Well controlled, no changes to meds. Encouraged heart healthy diet such as the DASH diet and exercise as tolerated.       Chronic constipation    Good response to Linzess. Encouraged increased hydration and fiber in diet. Daily probiotics. If bowels not moving can use MOM 2 tbls po in 4 oz of warm prune juice by mouth every 2-3 days. If no results then repeat in 4 hours with  Dulcolax suppository pr, may repeat again in 4 more hours as needed. Seek care if symptoms worsen. Consider daily Miralax and/or Dulcolax if symptoms persist. Miralax and Benefiber together prn      Bilateral lower extremity edema    Good response to compression hose, encouraged elevation prn and minimize sodium      Chronic renal insufficiency    Mild, hydrate and continue to monitor      Knee pain, bilateral    Encouraged moist heat and gentle stretching as tolerated. May try NSAIDs and prescription meds as directed and report if symptoms worsen or seek immediate care. ty topical Lidocaine prn       Other Visit Diagnoses    Renal insufficiency    -  Primary   Relevant Orders   Comprehensive metabolic panel (Completed)   Need for influenza vaccination       Relevant Orders   Flu vaccine HIGH DOSE PF (Fluzone High dose) (Completed)      I am having Meta Hatchet. [place] maintain his  traZODone, MYRBETRIQ, multivitamin with minerals, glucosamine-chondroitin, b complex vitamins, cycloSPORINE, potassium chloride SA, gabapentin, rosuvastatin, tolnaftate, torsemide, Wheat Dextrin (BENEFIBER PO), fluticasone, LINZESS, metoprolol succinate, diltiazem, warfarin, and amiodarone.  No orders of the defined types were placed in this encounter.    Penni Homans, MD

## 2018-08-08 NOTE — Assessment & Plan Note (Signed)
Mild, hydrate and continue to monitor

## 2018-08-08 NOTE — Assessment & Plan Note (Signed)
Encouraged moist heat and gentle stretching as tolerated. May try NSAIDs and prescription meds as directed and report if symptoms worsen or seek immediate care. ty topical Lidocaine prn

## 2018-08-08 NOTE — Assessment & Plan Note (Signed)
Well controlled, no changes to meds. Encouraged heart healthy diet such as the DASH diet and exercise as tolerated.  °

## 2018-08-08 NOTE — Assessment & Plan Note (Signed)
Encouraged heart healthy diet, increase exercise, avoid trans fats, consider a krill oil cap daily 

## 2018-08-08 NOTE — Assessment & Plan Note (Signed)
Good response to compression hose, encouraged elevation prn and minimize sodium

## 2018-08-08 NOTE — Assessment & Plan Note (Signed)
Good response to Linzess. Encouraged increased hydration and fiber in diet. Daily probiotics. If bowels not moving can use MOM 2 tbls po in 4 oz of warm prune juice by mouth every 2-3 days. If no results then repeat in 4 hours with  Dulcolax suppository pr, may repeat again in 4 more hours as needed. Seek care if symptoms worsen. Consider daily Miralax and/or Dulcolax if symptoms persist. Miralax and Benefiber together prn

## 2018-08-08 NOTE — Assessment & Plan Note (Signed)
Rate controlled, tolerating coumadin 

## 2018-08-11 ENCOUNTER — Other Ambulatory Visit: Payer: Self-pay

## 2018-08-11 ENCOUNTER — Encounter (HOSPITAL_COMMUNITY): Payer: Self-pay | Admitting: Internal Medicine

## 2018-08-11 ENCOUNTER — Ambulatory Visit (HOSPITAL_COMMUNITY)
Admission: RE | Admit: 2018-08-11 | Discharge: 2018-08-11 | Disposition: A | Discharge status: Home / Self Care | Payer: Medicare Other | Source: Ambulatory Visit | Attending: Internal Medicine | Admitting: Internal Medicine

## 2018-08-11 VITALS — BP 104/65 | HR 64 | Wt 157.0 lb

## 2018-08-11 DIAGNOSIS — Z9119 Patient's noncompliance with other medical treatment and regimen: Secondary | ICD-10-CM | POA: Diagnosis not present

## 2018-08-11 DIAGNOSIS — I5032 Chronic diastolic (congestive) heart failure: Secondary | ICD-10-CM | POA: Diagnosis not present

## 2018-08-11 DIAGNOSIS — Z85820 Personal history of malignant melanoma of skin: Secondary | ICD-10-CM | POA: Diagnosis not present

## 2018-08-11 DIAGNOSIS — I071 Rheumatic tricuspid insufficiency: Secondary | ICD-10-CM | POA: Diagnosis not present

## 2018-08-11 DIAGNOSIS — E7800 Pure hypercholesterolemia, unspecified: Secondary | ICD-10-CM | POA: Diagnosis not present

## 2018-08-11 DIAGNOSIS — Z7901 Long term (current) use of anticoagulants: Secondary | ICD-10-CM | POA: Insufficient documentation

## 2018-08-11 DIAGNOSIS — I5082 Biventricular heart failure: Secondary | ICD-10-CM | POA: Insufficient documentation

## 2018-08-11 DIAGNOSIS — Z8261 Family history of arthritis: Secondary | ICD-10-CM | POA: Insufficient documentation

## 2018-08-11 DIAGNOSIS — I440 Atrioventricular block, first degree: Secondary | ICD-10-CM

## 2018-08-11 DIAGNOSIS — Z9889 Other specified postprocedural states: Secondary | ICD-10-CM | POA: Diagnosis not present

## 2018-08-11 DIAGNOSIS — M1710 Unilateral primary osteoarthritis, unspecified knee: Secondary | ICD-10-CM | POA: Insufficient documentation

## 2018-08-11 DIAGNOSIS — Z8249 Family history of ischemic heart disease and other diseases of the circulatory system: Secondary | ICD-10-CM | POA: Diagnosis not present

## 2018-08-11 DIAGNOSIS — I2729 Other secondary pulmonary hypertension: Secondary | ICD-10-CM | POA: Insufficient documentation

## 2018-08-11 DIAGNOSIS — I341 Nonrheumatic mitral (valve) prolapse: Secondary | ICD-10-CM | POA: Insufficient documentation

## 2018-08-11 DIAGNOSIS — Z953 Presence of xenogenic heart valve: Secondary | ICD-10-CM | POA: Insufficient documentation

## 2018-08-11 DIAGNOSIS — I480 Paroxysmal atrial fibrillation: Secondary | ICD-10-CM | POA: Diagnosis not present

## 2018-08-11 DIAGNOSIS — Z8546 Personal history of malignant neoplasm of prostate: Secondary | ICD-10-CM | POA: Diagnosis not present

## 2018-08-11 DIAGNOSIS — Z09 Encounter for follow-up examination after completed treatment for conditions other than malignant neoplasm: Secondary | ICD-10-CM | POA: Diagnosis present

## 2018-08-11 DIAGNOSIS — F329 Major depressive disorder, single episode, unspecified: Secondary | ICD-10-CM | POA: Insufficient documentation

## 2018-08-11 DIAGNOSIS — I4892 Unspecified atrial flutter: Secondary | ICD-10-CM | POA: Insufficient documentation

## 2018-08-11 DIAGNOSIS — Z823 Family history of stroke: Secondary | ICD-10-CM | POA: Diagnosis not present

## 2018-08-11 DIAGNOSIS — I110 Hypertensive heart disease with heart failure: Secondary | ICD-10-CM | POA: Insufficient documentation

## 2018-08-11 DIAGNOSIS — Z79899 Other long term (current) drug therapy: Secondary | ICD-10-CM | POA: Diagnosis not present

## 2018-08-11 MED ORDER — AMIODARONE HCL 200 MG PO TABS: 100.0000 mg | ORAL_TABLET | Freq: Every day | ORAL | 12 refills | Status: DC

## 2018-08-11 NOTE — Progress Notes (Signed)
Advanced Heart Failure Clinic Consult Note   Referring Physician: Dr. Irish Lack Primary Care: Penni Homans, MD Primary Cardiologist: Dr. Irish Lack ( Previously Mare Ferrari) Primary HF: Dr. Haroldine Laws  HPI: [name] [name] ([place]) is an 84 y.o. male with history of diastolic HF, PAF/Aflutter on coumadin, and h/o severe MV prolapse s/p MV repair 10/01/11 who is referred for further evaluation of his heart failure.   Pt seen in Dr. Irish Lack office 03/11/17 and noted to have worsening peripheral edema. Thought to be related to RV failure.  Pt admitted non-compliance with second lasix dose some days. RHC suggested but pt prefers to avoid invasive procedures at this time. Lasix increased and referred to [facility] for wound care of BLEs.   He was admitted 04/26/17-04/30/17 with a fall, no syncope. Also with right sided heart failure. RHC showed normal cardiac output. No evidence of PAH. Diuresed with IV lasix, weight down about 10 pounds. Discharge weight was 190 pounds. He was discharged to SNF.   Today he returns for HF follow up. Says he is feeling pretty. Edema is well controlled. Weight stable around 158. PCP recently cut Toprol back due to SBP < 100. Has not been dizzy. No orthopnea or PND. Able to do ADLs. No significant bleeding on coumadin.     ECHO 09/04/2017  LVEF 55-60%  Severe TR, RV moderately to severely dilated. Flattened septum  Personally reviewed  Echo 12/19/16 with LVEF 50-55%, Mild AI, s/p MVR with mild residual MR, Mod LAE, Mod RV dilation, severe RAE, Severe TR.  RHC  RA = 8 RV = 36/7 PA = 33/4 (15) PCW = 6 Fick cardiac output/index = 7.7/3.6 PVR = 1.2 WU Ao sat = 99% PA sat = 69%, 72% SVC sat = 69%  Past Medical History:  Diagnosis Date  . Abnormality of gait 05/27/2016  . Adenomatous polyps   . Carpal tunnel syndrome 06/25/2016   Right  . Depressive disorder, not elsewhere classified   . First degree AV block    Holter 3/18: NSR, PACs, PVCs, no AFib, no pauses.  Marland Kitchen  Hereditary and idiopathic peripheral neuropathy 06/25/2016  . Hypertension   . Internal nasal lesion 05/15/2013  . Melanoma ([place])    Left Shoulder  . Mitral regurgitation   . MVP (mitral valve prolapse)    a. With severe MR s/p Complex valvuloplasty including artificial Gore-tex neochord placement x4, chordal transposition x1, chordal release x1, # 32 mm Sorin Memo 3D Ring Annuloplasty 2012. // b. Echo 2/18: mild LVH, EF 50-55, mild AI, MV repair with mild MR, mod LAE, mod RVE, severe RAE, severe TR  . Neuropathy   . Normal coronary arteries    a. Normal coronary anatomy by cath 2012.  . Osteoarthritis    Knees  . PAF (paroxysmal atrial fibrillation) ([place])    a. Post-op MVR 2012.  Marland Kitchen Personal history of colonic polyps   . Prostate cancer ([place])   . Pulmonary HTN ([place])    a. Mild-mod by cath 2012.  . Pure hypercholesterolemia   . PVC (premature ventricular contraction)   . Thrombocytopenia ([place])   . Vision abnormalities    Cornea scarring   Current Outpatient Medications  Medication Sig Dispense Refill  . amiodarone (PACERONE) 200 MG tablet TAKE 1 TABLET DAILY 30 tablet 12  . b complex vitamins tablet Take 1 tablet by mouth daily.    . cycloSPORINE (RESTASIS) 0.05 % ophthalmic emulsion Place 1 drop into both eyes 2 (two) times daily.    Marland Kitchen diltiazem (  CARDIZEM CD) 300 MG 24 hr capsule Take 1 capsule (300 mg total) by mouth daily. 90 capsule 3  . fluticasone (FLONASE) 50 MCG/ACT nasal spray SHAKE LQ AND U 2 SPRAYS IEN D  11  . gabapentin (NEURONTIN) 300 MG capsule Take 1 capsule (300 mg total) by mouth 2 (two) times daily. 180 capsule 3  . glucosamine-chondroitin 500-400 MG tablet Take 1 tablet by mouth 2 (two) times daily.    Marland Kitchen LINZESS 145 MCG CAPS capsule   6  . metoprolol succinate (TOPROL-XL) 25 MG 24 hr tablet Take 1 tablet (25 mg total) by mouth daily. 90 tablet 3  . Multiple Vitamin (MULTIVITAMIN WITH MINERALS) TABS tablet Take 1 tablet by mouth every evening.     Marland Kitchen MYRBETRIQ 50 MG  TB24 tablet Take 1 tablet by mouth daily.    . potassium chloride SA (K-DUR,KLOR-CON) 20 MEQ tablet Take 2 tablets (40 mEq total) by mouth daily. 180 tablet 3  . rosuvastatin (CRESTOR) 5 MG tablet TAKE 1 TABLET DAILY 90 tablet 3  . tolnaftate (ANTIFUNGAL SPRAY POWDER) 1 % spray Apply topically 2 (two) times daily. 130 g 0  . torsemide (DEMADEX) 20 MG tablet Take 2 tablets (40 mg total) by mouth daily. Take extra 20 mg tablet once daily as needed for 3 lbs or more weight gain. 180 tablet 3  . traZODone (DESYREL) 100 MG tablet Take 300 mg by mouth at bedtime.     Marland Kitchen warfarin (COUMADIN) 5 MG tablet TAKE 1 TABLET DAILY AS DIRECTED BY THE COUMADIN CLINIC 60 tablet 1  . Wheat Dextrin (BENEFIBER PO) Take by mouth.     No current facility-administered medications for this encounter.    No Known Allergies   Social History   Socioeconomic History  . Marital status: Widowed    Spouse name: Not on file  . Number of children: 2  . Years of education: 70  . Highest education level: Not on file  Occupational History  . Not on file  Social Needs  . Financial resource strain: Not on file  . Food insecurity:    Worry: Not on file    Inability: Not on file  . Transportation needs:    Medical: Not on file    Non-medical: Not on file  Tobacco Use  . Smoking status: Never Smoker  . Smokeless tobacco: Never Used  Substance and Sexual Activity  . Alcohol use: No    Alcohol/week: 0.0 standard drinks    Comment: Last drink in 2000  . Drug use: No  . Sexual activity: Not on file  Lifestyle  . Physical activity:    Days per week: Not on file    Minutes per session: Not on file  . Stress: Not on file  Relationships  . Social connections:    Talks on phone: Not on file    Gets together: Not on file    Attends religious service: Not on file    Active member of club or organization: Not on file    Attends meetings of clubs or organizations: Not on file    Relationship status: Not on file  .  Intimate partner violence:    Fear of current or ex partner: Not on file    Emotionally abused: Not on file    Physically abused: Not on file    Forced sexual activity: Not on file  Other Topics Concern  . Not on file  Social History Narrative   Retired - Optometrist   Widower  2 children (one in [place] and on one in [place])    Drinks 1 cup of coffee per day   Family History  Problem Relation Age of Onset  . Clotting disorder Brother        CVA's  . Arthritis Mother   . Hypertension Mother   . Stroke Mother   . Hypertension Father   . Psychosis Father        psychiatric care  . Colon cancer Neg Hx   . Stomach cancer Neg Hx   . Heart attack Neg Hx    Vitals:   08/11/18 1114  BP: 104/65  Pulse: 64  SpO2: 97%  Weight: 71.2 kg (157 lb)   Wt Readings from Last 3 Encounters:  08/11/18 71.2 kg (157 lb)  08/05/18 72.4 kg (159 lb 9.6 oz)  06/17/18 73.9 kg (163 lb)    PHYSICAL EXAM: General:  Tall Well appearing. No resp difficulty HEENT: normal Neck: supple. no JVD. Carotids 2+ bilat; no bruits. No lymphadenopathy or thryomegaly appreciated. Cor: PMI nondisplaced. Regular rate & rhythm. No rubs, gallops or murmurs. Lungs: clear Abdomen: soft, nontender, nondistended. No hepatosplenomegaly. No bruits or masses. Good bowel sounds. Extremities: no cyanosis, clubbing, rash, edema. +hyperpigmentation  Neuro: alert & orientedx3, cranial nerves grossly intact. moves all 4 extremities w/o difficulty. Affect pleasant  ECG: NSR 61 marked 1AVB at 362ms. Personally reviewed    ASSESSMENT & PLAN: 1. Combined diastolic HF with R>L symptoms in the setting of longstanding MV disease.  - Stable NYHA II. Volume status looks good  - Creatinine stable yesterday - Continue Toprol XL 25 mg daily.(Recently decreased by PCP)   2. PAF - Remains in NSR on ECG  - Continue diltiazem 300mg . Drop amio to 100mg  daily with 1AVB - Continue warfarin for anticoagulation. Will not switch to DOAC  with h/o MV repair  3. Severe TR - No change  - Not candidate for TVR due to age and comorbidities. Volume status well controlled with medical therapy.   4. [place] - RHC 6/18 without severe PAH.  (RA 8, PA 33/4 (15), CI 3.6)  5. Severe MVP S/P Bioprosthetic MVR  - Stable. - We discussed continue need for SBE prophylaxis  6. Marked 1AVB - Toprol recently cut back. Will cut amio back - May need to cut diltiazem back as well  - Follow closely for possible PPM need.    [name], MD 08/11/18

## 2018-08-11 NOTE — Patient Instructions (Addendum)
Decrease Amiodarone to 100mg  daily.  Follow up with Dr.Bensimhon in 6 months.

## 2018-08-23 ENCOUNTER — Other Ambulatory Visit: Payer: Self-pay | Admitting: Interventional Cardiology

## 2018-08-23 MED ORDER — DILTIAZEM HCL ER COATED BEADS 300 MG PO CP24: 300.0000 mg | ORAL_CAPSULE | Freq: Every day | ORAL | 3 refills | Status: DC

## 2018-08-25 ENCOUNTER — Ambulatory Visit (INDEPENDENT_AMBULATORY_CARE_PROVIDER_SITE_OTHER): Payer: Medicare Other | Admitting: *Deleted

## 2018-08-25 DIAGNOSIS — I4891 Unspecified atrial fibrillation: Secondary | ICD-10-CM

## 2018-08-25 DIAGNOSIS — Z5181 Encounter for therapeutic drug level monitoring: Secondary | ICD-10-CM | POA: Diagnosis not present

## 2018-08-25 LAB — POCT INR: INR: 1.9 — AB (ref 2.0–3.0)

## 2018-08-25 NOTE — Patient Instructions (Signed)
Description   Change your dose to 1/2 tablet daily except 1 tablet on Wednesdays. Recheck in 2 weeks. Call [phone] if scheduled for any procedures or on any new medications

## 2018-09-08 ENCOUNTER — Ambulatory Visit (INDEPENDENT_AMBULATORY_CARE_PROVIDER_SITE_OTHER): Payer: Medicare Other | Admitting: Pharmacist

## 2018-09-08 DIAGNOSIS — I4891 Unspecified atrial fibrillation: Secondary | ICD-10-CM | POA: Diagnosis not present

## 2018-09-08 DIAGNOSIS — Z5181 Encounter for therapeutic drug level monitoring: Secondary | ICD-10-CM

## 2018-09-08 LAB — POCT INR: INR: 2.1 (ref 2.0–3.0)

## 2018-09-08 NOTE — Patient Instructions (Signed)
Description   Continue taking 1/2 tablet daily except 1 tablet on Wednesdays. Recheck in 3 weeks. Call [phone] if scheduled for any procedures or on any new medications

## 2018-09-14 ENCOUNTER — Inpatient Hospital Stay: Payer: Medicare Other

## 2018-09-14 ENCOUNTER — Inpatient Hospital Stay: Payer: Medicare Other | Attending: Hematology & Oncology | Admitting: Hematology & Oncology

## 2018-09-14 ENCOUNTER — Encounter: Payer: Self-pay | Admitting: Hematology & Oncology

## 2018-09-14 ENCOUNTER — Other Ambulatory Visit: Payer: Self-pay

## 2018-09-14 VITALS — BP 106/58 | HR 62 | Temp 96.8°F | Resp 17 | Wt 159.0 lb

## 2018-09-14 DIAGNOSIS — D696 Thrombocytopenia, unspecified: Secondary | ICD-10-CM

## 2018-09-14 LAB — CBC WITH DIFFERENTIAL (CANCER CENTER ONLY)
ABS IMMATURE GRANULOCYTES: 0.04 10*3/uL (ref 0.00–0.07)
Basophils Absolute: 0 10*3/uL (ref 0.0–0.1)
Basophils Relative: 0 %
Eosinophils Absolute: 0 10*3/uL (ref 0.0–0.5)
Eosinophils Relative: 0 %
HEMATOCRIT: 43.8 % (ref 39.0–52.0)
HEMOGLOBIN: 13.7 g/dL (ref 13.0–17.0)
IMMATURE GRANULOCYTES: 1 %
LYMPHS ABS: 1.4 10*3/uL (ref 0.7–4.0)
LYMPHS PCT: 22 %
MCH: 29.1 pg (ref 26.0–34.0)
MCHC: 31.3 g/dL (ref 30.0–36.0)
MCV: 93.2 fL (ref 80.0–100.0)
MONO ABS: 1.2 10*3/uL — AB (ref 0.1–1.0)
MONOS PCT: 17 %
NEUTROS ABS: 4 10*3/uL (ref 1.7–7.7)
NEUTROS PCT: 60 %
PLATELETS: 77 10*3/uL — AB (ref 150–400)
RBC: 4.7 MIL/uL (ref 4.22–5.81)
RDW: 13.8 % (ref 11.5–15.5)
WBC Count: 6.6 10*3/uL (ref 4.0–10.5)
nRBC: 0 % (ref 0.0–0.2)

## 2018-09-14 LAB — PLATELET BY CITRATE

## 2018-09-14 NOTE — Progress Notes (Signed)
Hematology and Oncology Follow Up Visit  [name] [name] [id number] 06-19-1931 84 y.o. 09/14/2018   Principle Diagnosis:   Chronic thrombocytopenia-immune-based versus medication  Current Therapy:    Observation     Interim History:  [name] [name] is back for follow-up.  This is a second office visit.  We first saw him, I thought that the thrombocytopenia could be a mild form of ITP.  He could also be from medications.  He is totally asymptomatic.  He has had no bleeding or bruising.  He is on Coumadin.  He really cannot get around much because of his poor arthritis.  He has really bad arthritis in his knees.  He gets around in a wheelchair.  He has had no issues with cough.  He has had no change in bowel or bladder habits.  He does have a history of prostate cancer and melanoma.  He never received radiation therapy for the prostate cancer.  His appetite is good.  He has had no nausea or vomiting.  Her graph overall, his performance status is ECOG 2.  Medications:  Current Outpatient Medications:  .  amiodarone (PACERONE) 200 MG tablet, Take 0.5 tablets (100 mg total) by mouth daily., Disp: 15 tablet, Rfl: 12 .  b complex vitamins tablet, Take 1 tablet by mouth daily., Disp: , Rfl:  .  cycloSPORINE (RESTASIS) 0.05 % ophthalmic emulsion, Place 1 drop into both eyes 2 (two) times daily., Disp: , Rfl:  .  diltiazem (CARDIZEM CD) 300 MG 24 hr capsule, Take 1 capsule (300 mg total) by mouth daily., Disp: 90 capsule, Rfl: 3 .  fluticasone (FLONASE) 50 MCG/ACT nasal spray, SHAKE LQ AND U 2 SPRAYS IEN D, Disp: , Rfl: 11 .  gabapentin (NEURONTIN) 300 MG capsule, Take 1 capsule (300 mg total) by mouth 2 (two) times daily., Disp: 180 capsule, Rfl: 3 .  glucosamine-chondroitin 500-400 MG tablet, Take 1 tablet by mouth 2 (two) times daily., Disp: , Rfl:  .  LINZESS 145 MCG CAPS capsule, , Disp: , Rfl: 6 .  metoprolol succinate (TOPROL-XL) 25 MG 24 hr tablet, Take 1 tablet (25 mg total) by mouth  daily., Disp: 90 tablet, Rfl: 3 .  Multiple Vitamin (MULTIVITAMIN WITH MINERALS) TABS tablet, Take 1 tablet by mouth every evening. , Disp: , Rfl:  .  MYRBETRIQ 50 MG TB24 tablet, Take 1 tablet by mouth daily., Disp: , Rfl:  .  potassium chloride SA (K-DUR,KLOR-CON) 20 MEQ tablet, Take 2 tablets (40 mEq total) by mouth daily., Disp: 180 tablet, Rfl: 3 .  rosuvastatin (CRESTOR) 5 MG tablet, TAKE 1 TABLET DAILY, Disp: 90 tablet, Rfl: 3 .  tolnaftate (ANTIFUNGAL SPRAY POWDER) 1 % spray, Apply topically 2 (two) times daily., Disp: 130 g, Rfl: 0 .  torsemide (DEMADEX) 20 MG tablet, Take 2 tablets (40 mg total) by mouth daily. Take extra 20 mg tablet once daily as needed for 3 lbs or more weight gain., Disp: 180 tablet, Rfl: 3 .  traZODone (DESYREL) 100 MG tablet, Take 300 mg by mouth at bedtime. , Disp: , Rfl:  .  warfarin (COUMADIN) 5 MG tablet, TAKE 1 TABLET DAILY AS DIRECTED BY THE COUMADIN CLINIC, Disp: 60 tablet, Rfl: 1 .  Wheat Dextrin (BENEFIBER PO), Take by mouth., Disp: , Rfl:   Allergies: No Known Allergies  Past Medical History, Surgical history, Social history, and Family History were reviewed and updated.  Review of Systems: Review of Systems  Constitutional: Negative.   HENT:  Negative.  Eyes: Negative.   Respiratory: Negative.   Cardiovascular: Negative.   Gastrointestinal: Negative.   Endocrine: Negative.   Genitourinary: Negative.    Musculoskeletal: Positive for arthralgias.  Skin: Negative.   Neurological: Negative.   Hematological: Negative.   Psychiatric/Behavioral: Negative.     Physical Exam:  weight is 159 lb (72.1 kg). His oral temperature is 96.8 F (36 C) (abnormal). His blood pressure is 106/58 (abnormal) and his pulse is 62. His respiration is 17 and oxygen saturation is 98%.   Wt Readings from Last 3 Encounters:  09/14/18 159 lb (72.1 kg)  08/11/18 157 lb (71.2 kg)  08/05/18 159 lb 9.6 oz (72.4 kg)    Physical Exam  Constitutional: He is oriented  to person, place, and time.  HENT:  Head: Normocephalic and atraumatic.  Mouth/Throat: Oropharynx is clear and moist.  Eyes: Pupils are equal, round, and reactive to light. EOM are normal.  Neck: Normal range of motion.  Cardiovascular: Normal rate, regular rhythm and normal heart sounds.  Pulmonary/Chest: Effort normal and breath sounds normal.  Abdominal: Soft. Bowel sounds are normal.  Musculoskeletal: Normal range of motion. He exhibits no edema, tenderness or deformity.  Lymphadenopathy:    He has no cervical adenopathy.  Neurological: He is alert and oriented to person, place, and time.  Skin: Skin is warm and dry. No rash noted. No erythema.  Psychiatric: He has a normal mood and affect. His behavior is normal. Judgment and thought content normal.  Vitals reviewed.    Lab Results  Component Value Date   WBC 6.6 09/14/2018   HGB 13.7 09/14/2018   HCT 43.8 09/14/2018   MCV 93.2 09/14/2018   PLT 77 (L) 09/14/2018     Chemistry      Component Value Date/Time   NA 140 08/05/2018 1148   NA 143 02/23/2017 1103   K 4.4 08/05/2018 1148   CL 104 08/05/2018 1148   CO2 28 08/05/2018 1148   BUN 27 (H) 08/05/2018 1148   BUN 25 02/23/2017 1103   CREATININE 1.16 08/05/2018 1148   CREATININE 1.06 08/25/2016 1339      Component Value Date/Time   CALCIUM 9.7 08/05/2018 1148   ALKPHOS 64 08/05/2018 1148   AST 23 08/05/2018 1148   ALT 23 08/05/2018 1148   BILITOT 0.7 08/05/2018 1148       Impression and Plan: [name] [name] is a 84 year old white male.  He has mild thrombocytopenia.  Given his performance status, which is not that great, I really do not have to pursue an aggressive work-up.  It is possible he may have myelodysplasia.  However, he is asymptomatic.  He would not be a candidate for any intervention.  I still think we can just follow him along.  I feel confident that his platelet count is not going to drop significantly.  I will plan to see him back in 6 months.  I  think this would be very reasonable.   Volanda Napoleon, MD 10/29/201912:48 PM

## 2018-09-15 ENCOUNTER — Other Ambulatory Visit: Payer: Self-pay | Admitting: Internal Medicine

## 2018-09-29 ENCOUNTER — Ambulatory Visit (INDEPENDENT_AMBULATORY_CARE_PROVIDER_SITE_OTHER): Payer: Medicare Other | Admitting: *Deleted

## 2018-09-29 DIAGNOSIS — Z5181 Encounter for therapeutic drug level monitoring: Secondary | ICD-10-CM

## 2018-09-29 DIAGNOSIS — I4891 Unspecified atrial fibrillation: Secondary | ICD-10-CM

## 2018-09-29 LAB — POCT INR: INR: 1.9 — AB (ref 2.0–3.0)

## 2018-09-29 NOTE — Patient Instructions (Signed)
Description   Today take 1.5 tablets then continue taking 1/2 tablet daily except 1 tablet on Wednesdays. Recheck in 3 weeks. Call [phone] if scheduled for any procedures or on any new medications

## 2018-10-12 ENCOUNTER — Other Ambulatory Visit (HOSPITAL_COMMUNITY): Payer: Self-pay | Admitting: Internal Medicine

## 2018-10-12 ENCOUNTER — Other Ambulatory Visit: Payer: Self-pay | Admitting: Cardiology

## 2018-10-19 ENCOUNTER — Other Ambulatory Visit: Payer: Self-pay | Admitting: Family Medicine

## 2018-10-19 DIAGNOSIS — K5909 Other constipation: Secondary | ICD-10-CM

## 2018-10-20 ENCOUNTER — Ambulatory Visit (INDEPENDENT_AMBULATORY_CARE_PROVIDER_SITE_OTHER): Payer: Medicare Other | Admitting: Pharmacist

## 2018-10-20 DIAGNOSIS — I4891 Unspecified atrial fibrillation: Secondary | ICD-10-CM

## 2018-10-20 DIAGNOSIS — Z5181 Encounter for therapeutic drug level monitoring: Secondary | ICD-10-CM | POA: Diagnosis not present

## 2018-10-20 LAB — POCT INR: INR: 1.9 — AB (ref 2.0–3.0)

## 2018-10-20 NOTE — Patient Instructions (Signed)
Description   Today take 1.5 tablets then change dose to 1/2 tablet daily except 1 tablet on Wednesdays and Saturdays. Recheck in 2 weeks. Call [phone] if scheduled for any procedures or on any new medications

## 2018-11-01 ENCOUNTER — Encounter: Payer: Self-pay | Admitting: Family Medicine

## 2018-11-01 ENCOUNTER — Ambulatory Visit (INDEPENDENT_AMBULATORY_CARE_PROVIDER_SITE_OTHER): Payer: Medicare Other | Admitting: Family Medicine

## 2018-11-01 VITALS — BP 102/60 | HR 61 | Temp 97.3°F | Resp 18 | Wt 170.0 lb

## 2018-11-01 DIAGNOSIS — I5032 Chronic diastolic (congestive) heart failure: Secondary | ICD-10-CM

## 2018-11-01 DIAGNOSIS — D72829 Elevated white blood cell count, unspecified: Secondary | ICD-10-CM | POA: Diagnosis not present

## 2018-11-01 DIAGNOSIS — I38 Endocarditis, valve unspecified: Secondary | ICD-10-CM | POA: Diagnosis not present

## 2018-11-01 DIAGNOSIS — N390 Urinary tract infection, site not specified: Secondary | ICD-10-CM | POA: Diagnosis not present

## 2018-11-01 DIAGNOSIS — N179 Acute kidney failure, unspecified: Secondary | ICD-10-CM

## 2018-11-01 LAB — COMPREHENSIVE METABOLIC PANEL
ALBUMIN: 4 g/dL (ref 3.5–5.2)
ALT: 18 U/L (ref 0–53)
AST: 16 U/L (ref 0–37)
Alkaline Phosphatase: 51 U/L (ref 39–117)
BUN: 33 mg/dL — ABNORMAL HIGH (ref 6–23)
CALCIUM: 9.3 mg/dL (ref 8.4–10.5)
CO2: 30 mEq/L (ref 19–32)
CREATININE: 1.23 mg/dL (ref 0.40–1.50)
Chloride: 105 mEq/L (ref 96–112)
GFR: 59.36 mL/min — ABNORMAL LOW (ref >60.00)
Glucose, Bld: 84 mg/dL (ref 70–99)
Potassium: 4.5 mEq/L (ref 3.5–5.1)
Sodium: 141 mEq/L (ref 135–145)
TOTAL PROTEIN: 6.7 g/dL (ref 6.0–8.3)
Total Bilirubin: 0.7 mg/dL (ref 0.2–1.2)

## 2018-11-01 LAB — URINALYSIS
Bilirubin Urine: NEGATIVE
Hgb urine dipstick: NEGATIVE
Ketones, ur: NEGATIVE
Leukocytes, UA: NEGATIVE
NITRITE: NEGATIVE
Specific Gravity, Urine: 1.01 (ref 1.000–1.030)
Total Protein, Urine: NEGATIVE
Urine Glucose: NEGATIVE
Urobilinogen, UA: 0.2 (ref 0.0–1.0)
pH: 5.5 (ref 5.0–8.0)

## 2018-11-01 LAB — CBC
HCT: 38.9 % — ABNORMAL LOW (ref 39.0–52.0)
Hemoglobin: 12.9 g/dL — ABNORMAL LOW (ref 13.0–17.0)
MCHC: 33.1 g/dL (ref 30.0–36.0)
MCV: 89.5 fl (ref 78.0–100.0)
PLATELETS: 78 10*3/uL — AB (ref 150.0–400.0)
RBC: 4.35 Mil/uL (ref 4.22–5.81)
RDW: 14.3 % (ref 11.5–15.5)
WBC: 6.9 10*3/uL (ref 4.0–10.5)

## 2018-11-01 LAB — TSH: TSH: 0.99 u[IU]/mL (ref 0.35–4.50)

## 2018-11-01 NOTE — Assessment & Plan Note (Signed)
Follows with cardiology and is doing better

## 2018-11-01 NOTE — Patient Instructions (Addendum)
Hydrate with 60-80 ounces of fluids daily and protein with every meal. No getting up fast  shingrix is the new shingles shots over 2-6 months at the pharmacy Hypertension Hypertension, commonly called high blood pressure, is when the force of blood pumping through the arteries is too strong. The arteries are the blood vessels that carry blood from the heart throughout the body. Hypertension forces the heart to work harder to pump blood and may cause arteries to become narrow or stiff. Having untreated or uncontrolled hypertension can cause heart attacks, strokes, kidney disease, and other problems. A blood pressure reading consists of a higher number over a lower number. Ideally, your blood pressure should be below 120/80. The first ("top") number is called the systolic pressure. It is a measure of the pressure in your arteries as your heart beats. The second ("bottom") number is called the diastolic pressure. It is a measure of the pressure in your arteries as the heart relaxes. What are the causes? The cause of this condition is not known. What increases the risk? Some risk factors for high blood pressure are under your control. Others are not. Factors you can change  Smoking.  Having type 2 diabetes mellitus, high cholesterol, or both.  Not getting enough exercise or physical activity.  Being overweight.  Having too much fat, sugar, calories, or salt (sodium) in your diet.  Drinking too much alcohol. Factors that are difficult or impossible to change  Having chronic kidney disease.  Having a family history of high blood pressure.  Age. Risk increases with age.  Race. You may be at higher risk if you are African-American.  Gender. Men are at higher risk than women before age 21. After age 57, women are at higher risk than men.  Having obstructive sleep apnea.  Stress. What are the signs or symptoms? Extremely high blood pressure (hypertensive crisis) may  cause:  Headache.  Anxiety.  Shortness of breath.  Nosebleed.  Nausea and vomiting.  Severe chest pain.  Jerky movements you cannot control (seizures).  How is this diagnosed? This condition is diagnosed by measuring your blood pressure while you are seated, with your arm resting on a surface. The cuff of the blood pressure monitor will be placed directly against the skin of your upper arm at the level of your heart. It should be measured at least twice using the same arm. Certain conditions can cause a difference in blood pressure between your right and left arms. Certain factors can cause blood pressure readings to be lower or higher than normal (elevated) for a short period of time:  When your blood pressure is higher when you are in a health care provider's office than when you are at home, this is called white coat hypertension. Most people with this condition do not need medicines.  When your blood pressure is higher at home than when you are in a health care provider's office, this is called masked hypertension. Most people with this condition may need medicines to control blood pressure.  If you have a high blood pressure reading during one visit or you have normal blood pressure with other risk factors:  You may be asked to return on a different day to have your blood pressure checked again.  You may be asked to monitor your blood pressure at home for 1 week or longer.  If you are diagnosed with hypertension, you may have other blood or imaging tests to help your health care provider understand your overall risk  for other conditions. How is this treated? This condition is treated by making healthy lifestyle changes, such as eating healthy foods, exercising more, and reducing your alcohol intake. Your health care provider may prescribe medicine if lifestyle changes are not enough to get your blood pressure under control, and if:  Your systolic blood pressure is above  130.  Your diastolic blood pressure is above 80.  Your personal target blood pressure may vary depending on your medical conditions, your age, and other factors. Follow these instructions at home: Eating and drinking  Eat a diet that is high in fiber and potassium, and low in sodium, added sugar, and fat. An example eating plan is called the DASH (Dietary Approaches to Stop Hypertension) diet. To eat this way: ? Eat plenty of fresh fruits and vegetables. Try to fill half of your plate at each meal with fruits and vegetables. ? Eat whole grains, such as whole wheat pasta, brown rice, or whole grain bread. Fill about one quarter of your plate with whole grains. ? Eat or drink low-fat dairy products, such as skim milk or low-fat yogurt. ? Avoid fatty cuts of meat, processed or cured meats, and poultry with skin. Fill about one quarter of your plate with lean proteins, such as fish, chicken without skin, beans, eggs, and tofu. ? Avoid premade and processed foods. These tend to be higher in sodium, added sugar, and fat.  Reduce your daily sodium intake. Most people with hypertension should eat less than 1,500 mg of sodium a day.  Limit alcohol intake to no more than 1 drink a day for nonpregnant women and 2 drinks a day for men. One drink equals 12 oz of beer, 5 oz of wine, or 1 oz of hard liquor. Lifestyle  Work with your health care provider to maintain a healthy body weight or to lose weight. Ask what an ideal weight is for you.  Get at least 30 minutes of exercise that causes your heart to beat faster (aerobic exercise) most days of the week. Activities may include walking, swimming, or biking.  Include exercise to strengthen your muscles (resistance exercise), such as pilates or lifting weights, as part of your weekly exercise routine. Try to do these types of exercises for 30 minutes at least 3 days a week.  Do not use any products that contain nicotine or tobacco, such as cigarettes and  e-cigarettes. If you need help quitting, ask your health care provider.  Monitor your blood pressure at home as told by your health care provider.  Keep all follow-up visits as told by your health care provider. This is important. Medicines  Take over-the-counter and prescription medicines only as told by your health care provider. Follow directions carefully. Blood pressure medicines must be taken as prescribed.  Do not skip doses of blood pressure medicine. Doing this puts you at risk for problems and can make the medicine less effective.  Ask your health care provider about side effects or reactions to medicines that you should watch for. Contact a health care provider if:  You think you are having a reaction to a medicine you are taking.  You have headaches that keep coming back (recurring).  You feel dizzy.  You have swelling in your ankles.  You have trouble with your vision. Get help right away if:  You develop a severe headache or confusion.  You have unusual weakness or numbness.  You feel faint.  You have severe pain in your chest or abdomen.  You vomit repeatedly.  You have trouble breathing. Summary  Hypertension is when the force of blood pumping through your arteries is too strong. If this condition is not controlled, it may put you at risk for serious complications.  Your personal target blood pressure may vary depending on your medical conditions, your age, and other factors. For most people, a normal blood pressure is less than 120/80.  Hypertension is treated with lifestyle changes, medicines, or a combination of both. Lifestyle changes include weight loss, eating a healthy, low-sodium diet, exercising more, and limiting alcohol. This information is not intended to replace advice given to you by your health care provider. Make sure you discuss any questions you have with your health care provider. Document Released: 11/03/2005 Document Revised: 10/01/2016  Document Reviewed: 10/01/2016 Elsevier Interactive Patient Education  [employer].

## 2018-11-01 NOTE — Assessment & Plan Note (Signed)
UA and culture

## 2018-11-01 NOTE — Progress Notes (Signed)
Subjective:    Patient ID: [name] [name], male    DOB: Sep 10, 1931, 84 y.o.   MRN: [id number]  No chief complaint on file.   HPI Patient is in today for follow-up.  Overall he is feeling well.  He is having some occasional abdominal discomfort but none today.  No recent bloody or tarry stool no acute concerns or recent febrile illness. Denies CP/palp/SOB/HA/congestion/fevers/GI or GU c/o. Taking meds as prescribed  Past Medical History:  Diagnosis Date  . Abnormality of gait 05/27/2016  . Adenomatous polyps   . Carpal tunnel syndrome 06/25/2016   Right  . Depressive disorder, not elsewhere classified   . First degree AV block    Holter 3/18: NSR, PACs, PVCs, no AFib, no pauses.  Marland Kitchen Hereditary and idiopathic peripheral neuropathy 06/25/2016  . Hypertension   . Internal nasal lesion 05/15/2013  . Melanoma ([place])    Left Shoulder  . Mitral regurgitation   . MVP (mitral valve prolapse)    a. With severe MR s/p Complex valvuloplasty including artificial Gore-tex neochord placement x4, chordal transposition x1, chordal release x1, # 32 mm Sorin Memo 3D Ring Annuloplasty 2012. // b. Echo 2/18: mild LVH, EF 50-55, mild AI, MV repair with mild MR, mod LAE, mod RVE, severe RAE, severe TR  . Neuropathy   . Normal coronary arteries    a. Normal coronary anatomy by cath 2012.  . Osteoarthritis    Knees  . PAF (paroxysmal atrial fibrillation) ([place])    a. Post-op MVR 2012.  Marland Kitchen Personal history of colonic polyps   . Prostate cancer ([place])   . Pulmonary HTN ([place])    a. Mild-mod by cath 2012.  . Pure hypercholesterolemia   . PVC (premature ventricular contraction)   . Thrombocytopenia ([place])   . Vision abnormalities    Cornea scarring    Past Surgical History:  Procedure Laterality Date  . CARDIAC CATHETERIZATION  09/2011   Pre-op for MVR -- normal coronaries.  Marland Kitchen CARDIOVERSION N/A 01/02/2016   Procedure: CARDIOVERSION;  Surgeon: Thayer Headings, MD;  Location: [facility] ENDOSCOPY;  Service:  Cardiovascular;  Laterality: N/A;  . COLONOSCOPY W/ POLYPECTOMY    . INGUINAL HERNIA REPAIR  09/2009   Left  . KNEE ARTHROSCOPY      left x3  and right x2  . Melanoma Surgery     2001, 2005, 2006, 2009  . MITRAL VALVE REPAIR  10/01/2011   complex valvuloplasty with Goretex cord replacement and chordal transposition 28mm Sorin Memo 3D ring annuloplasty  . Nuclear Stress Test  09/2006   EF-64%, Normal  . PROSTATECTOMY  1993  . RIGHT HEART CATH N/A 04/29/2017   Procedure: Right Heart Cath;  Surgeon: Jolaine Artist, MD;  Location: [facility] CV LAB;  Service: Cardiovascular;  Laterality: N/A;  . ROOT CANAL  08-19-12  . ROTATOR CUFF REPAIR  2003   left  . TEE WITHOUT CARDIOVERSION  09/26/2011   Procedure: TRANSESOPHAGEAL ECHOCARDIOGRAM (TEE);  Surgeon: Lelon Perla, MD;  Location: [facility] ENDOSCOPY;  Service: Cardiovascular;  Laterality: N/A;  . US ECHOCARDIOGRAPHY  09/2009, 08/1011   mild LVH,mild AI,MVP with mild MR, mild-mod. TR with mild Pulm. HTN, EF-55-60%    Family History  Problem Relation Age of Onset  . Clotting disorder Brother        CVA's  . Arthritis Mother   . Hypertension Mother   . Stroke Mother   . Hypertension Father   . Psychosis Father  psychiatric care  . Colon cancer Neg Hx   . Stomach cancer Neg Hx   . Heart attack Neg Hx     Social History   Socioeconomic History  . Marital status: Widowed    Spouse name: Not on file  . Number of children: 2  . Years of education: 28  . Highest education level: Not on file  Occupational History  . Not on file  Social Needs  . Financial resource strain: Not on file  . Food insecurity:    Worry: Not on file    Inability: Not on file  . Transportation needs:    Medical: Not on file    Non-medical: Not on file  Tobacco Use  . Smoking status: Never Smoker  . Smokeless tobacco: Never Used  Substance and Sexual Activity  . Alcohol use: No    Alcohol/week: 0.0 standard drinks    Comment: Last drink  in 2000  . Drug use: No  . Sexual activity: Not on file  Lifestyle  . Physical activity:    Days per week: Not on file    Minutes per session: Not on file  . Stress: Not on file  Relationships  . Social connections:    Talks on phone: Not on file    Gets together: Not on file    Attends religious service: Not on file    Active member of club or organization: Not on file    Attends meetings of clubs or organizations: Not on file    Relationship status: Not on file  . Intimate partner violence:    Fear of current or ex partner: Not on file    Emotionally abused: Not on file    Physically abused: Not on file    Forced sexual activity: Not on file  Other Topics Concern  . Not on file  Social History Narrative   Retired - Optometrist   Widower   2 children (one in [place] and on one in [place])    Drinks 1 cup of coffee per day    Outpatient Medications Prior to Visit  Medication Sig Dispense Refill  . amiodarone (PACERONE) 200 MG tablet Take 0.5 tablets (100 mg total) by mouth daily. 15 tablet 12  . b complex vitamins tablet Take 1 tablet by mouth daily.    . cycloSPORINE (RESTASIS) 0.05 % ophthalmic emulsion Place 1 drop into both eyes 2 (two) times daily.    Marland Kitchen diltiazem (CARDIZEM CD) 300 MG 24 hr capsule Take 1 capsule (300 mg total) by mouth daily. 90 capsule 3  . fluticasone (FLONASE) 50 MCG/ACT nasal spray SHAKE LQ AND U 2 SPRAYS IEN D  11  . gabapentin (NEURONTIN) 300 MG capsule Take 1 capsule (300 mg total) by mouth 2 (two) times daily. 180 capsule 3  . glucosamine-chondroitin 500-400 MG tablet Take 1 tablet by mouth 2 (two) times daily.    Marland Kitchen linaclotide (LINZESS) 145 MCG CAPS capsule Take 1 capsule (145 mcg total) by mouth daily before breakfast. 90 capsule 0  . metoprolol succinate (TOPROL-XL) 25 MG 24 hr tablet Take 1 tablet (25 mg total) by mouth daily. 90 tablet 3  . Multiple Vitamin (MULTIVITAMIN WITH MINERALS) TABS tablet Take 1 tablet by mouth every evening.       Marland Kitchen MYRBETRIQ 50 MG TB24 tablet Take 1 tablet by mouth daily.    . potassium chloride SA (K-DUR,KLOR-CON) 20 MEQ tablet Take 2 tablets (40 mEq total) by mouth daily. 180 tablet 3  .  potassium chloride SA (K-DUR,KLOR-CON) 20 MEQ tablet TAKE 2 TABLETS DAILY 180 tablet 4  . rosuvastatin (CRESTOR) 5 MG tablet TAKE 1 TABLET DAILY 90 tablet 1  . tolnaftate (ANTIFUNGAL SPRAY POWDER) 1 % spray Apply topically 2 (two) times daily. 130 g 0  . torsemide (DEMADEX) 20 MG tablet Take 2 tablets (40 mg total) by mouth daily. Take extra 20 mg tablet once daily as needed for 3 lbs or more weight gain. 180 tablet 3  . torsemide (DEMADEX) 20 MG tablet TAKE 2 TABLETS DAILY 180 tablet 1  . traZODone (DESYREL) 100 MG tablet Take 300 mg by mouth at bedtime.     Marland Kitchen warfarin (COUMADIN) 5 MG tablet TAKE 1 TABLET DAILY AS DIRECTED BY THE COUMADIN CLINIC 60 tablet 1  . Wheat Dextrin (BENEFIBER PO) Take by mouth.     No facility-administered medications prior to visit.     No Known Allergies  Review of Systems  Constitutional: Negative for fever and malaise/fatigue.  HENT: Negative for congestion.   Eyes: Negative for blurred vision.  Respiratory: Negative for shortness of breath.   Cardiovascular: Negative for chest pain, palpitations and leg swelling.  Gastrointestinal: Negative for abdominal pain, blood in stool and nausea.  Genitourinary: Negative for dysuria and frequency.  Musculoskeletal: Negative for falls.  Skin: Negative for rash.  Neurological: Negative for dizziness, loss of consciousness and headaches.  Endo/Heme/Allergies: Negative for environmental allergies.  Psychiatric/Behavioral: Negative for depression. The patient is not nervous/anxious.        Objective:    Physical Exam Vitals signs and nursing note reviewed.  Constitutional:      General: He is not in acute distress.    Appearance: He is well-developed.  HENT:     Head: Normocephalic and atraumatic.     Nose: Nose normal.  Eyes:      General:        Right eye: No discharge.        Left eye: No discharge.  Neck:     Musculoskeletal: Normal range of motion and neck supple.  Cardiovascular:     Rate and Rhythm: Normal rate and regular rhythm.     Heart sounds: No murmur.  Pulmonary:     Effort: Pulmonary effort is normal.     Breath sounds: Normal breath sounds.  Abdominal:     General: Bowel sounds are normal.     Palpations: Abdomen is soft.     Tenderness: There is no abdominal tenderness.  Skin:    General: Skin is warm and dry.  Neurological:     Mental Status: He is alert and oriented to person, place, and time.     BP 102/60 (BP Location: Left Arm, Patient Position: Sitting, Cuff Size: Normal)   Pulse 61   Temp (!) 97.3 F (36.3 C) (Oral)   Resp 18   Wt 170 lb (77.1 kg)   SpO2 93%   BMI 21.25 kg/m  Wt Readings from Last 3 Encounters:  11/01/18 170 lb (77.1 kg)  09/14/18 159 lb (72.1 kg)  08/11/18 157 lb (71.2 kg)     Lab Results  Component Value Date   WBC 6.9 11/01/2018   HGB 12.9 (L) 11/01/2018   HCT 38.9 (L) 11/01/2018   PLT 78.0 (L) 11/01/2018   GLUCOSE 84 11/01/2018   CHOL 103 11/25/2017   TRIG 60.0 11/25/2017   HDL 46.80 11/25/2017   LDLCALC 44 11/25/2017   ALT 18 11/01/2018   AST 16 11/01/2018   NA 141  11/01/2018   K 4.5 11/01/2018   CL 105 11/01/2018   CREATININE 1.23 11/01/2018   BUN 33 (H) 11/01/2018   CO2 30 11/01/2018   TSH 0.99 11/01/2018   PSA 0.07 (L) 09/17/2010   INR 1.9 (A) 10/20/2018   HGBA1C 6.0 (H) 04/03/2014    Lab Results  Component Value Date   TSH 0.99 11/01/2018   Lab Results  Component Value Date   WBC 6.9 11/01/2018   HGB 12.9 (L) 11/01/2018   HCT 38.9 (L) 11/01/2018   MCV 89.5 11/01/2018   PLT 78.0 (L) 11/01/2018   Lab Results  Component Value Date   NA 141 11/01/2018   K 4.5 11/01/2018   CO2 30 11/01/2018   GLUCOSE 84 11/01/2018   BUN 33 (H) 11/01/2018   CREATININE 1.23 11/01/2018   BILITOT 0.7 11/01/2018   ALKPHOS 51 11/01/2018    AST 16 11/01/2018   ALT 18 11/01/2018   PROT 6.7 11/01/2018   ALBUMIN 4.0 11/01/2018   CALCIUM 9.3 11/01/2018   ANIONGAP 10 04/16/2018   GFR 59.36 (L) 11/01/2018   Lab Results  Component Value Date   CHOL 103 11/25/2017   Lab Results  Component Value Date   HDL 46.80 11/25/2017   Lab Results  Component Value Date   LDLCALC 44 11/25/2017   Lab Results  Component Value Date   TRIG 60.0 11/25/2017   Lab Results  Component Value Date   CHOLHDL 2 11/25/2017   Lab Results  Component Value Date   HGBA1C 6.0 (H) 04/03/2014       Assessment & Plan:   Problem List Items Addressed This Visit    Chronic diastolic heart failure ([place]) - Primary (Chronic)   Relevant Orders   TSH (Completed)   Valvular heart disease    Follows with cardiology and is doing better      Relevant Orders   TSH (Completed)   UTI (urinary tract infection)    UA and culture       Relevant Orders   Urinalysis (Completed)   Urine Culture   Acute renal failure ([place])   Relevant Orders   Comprehensive metabolic panel (Completed)   Leukocytosis   Relevant Orders   CBC (Completed)      I have discontinued Meta Hatchet. Beedle's Wheat Dextrin (BENEFIBER PO). I am also having him maintain his traZODone, MYRBETRIQ, multivitamin with minerals, glucosamine-chondroitin, b complex vitamins, cycloSPORINE, potassium chloride SA, gabapentin, tolnaftate, torsemide, fluticasone, metoprolol succinate, warfarin, amiodarone, diltiazem, potassium chloride SA, rosuvastatin, torsemide, and linaclotide.  No orders of the defined types were placed in this encounter.    Penni Homans, MD

## 2018-11-02 LAB — URINE CULTURE
MICRO NUMBER:: 91501808
SPECIMEN QUALITY:: ADEQUATE

## 2018-11-04 ENCOUNTER — Ambulatory Visit: Payer: Medicare Other | Admitting: Family Medicine

## 2018-11-11 ENCOUNTER — Ambulatory Visit (INDEPENDENT_AMBULATORY_CARE_PROVIDER_SITE_OTHER): Payer: Medicare Other | Admitting: *Deleted

## 2018-11-11 DIAGNOSIS — Z5181 Encounter for therapeutic drug level monitoring: Secondary | ICD-10-CM | POA: Diagnosis not present

## 2018-11-11 DIAGNOSIS — I4891 Unspecified atrial fibrillation: Secondary | ICD-10-CM | POA: Diagnosis not present

## 2018-11-11 LAB — POCT INR: INR: 3.5 — AB (ref 2.0–3.0)

## 2018-11-11 NOTE — Patient Instructions (Signed)
Description   Skip today's dose, then resume taking  1/2 tablet daily except 1 tablet on Wednesdays and Saturdays. Eat a largest leafy green vegetable once a week.  Recheck in 2 weeks. Call [phone] if scheduled for any procedures or on any new medications

## 2018-11-25 ENCOUNTER — Ambulatory Visit (INDEPENDENT_AMBULATORY_CARE_PROVIDER_SITE_OTHER): Payer: Medicare Other | Admitting: Pharmacist

## 2018-11-25 DIAGNOSIS — Z5181 Encounter for therapeutic drug level monitoring: Secondary | ICD-10-CM | POA: Diagnosis not present

## 2018-11-25 DIAGNOSIS — I4891 Unspecified atrial fibrillation: Secondary | ICD-10-CM | POA: Diagnosis not present

## 2018-11-25 LAB — POCT INR: INR: 2.6 (ref 2.0–3.0)

## 2018-11-25 NOTE — Patient Instructions (Signed)
Description   Continue taking 1/2 tablet daily except 1 tablet on Wednesdays and Saturdays. Eat a largest leafy green vegetable once a week.  Recheck in 3 weeks. Call [phone] if scheduled for any procedures or on any new medications

## 2018-12-08 ENCOUNTER — Other Ambulatory Visit (INDEPENDENT_AMBULATORY_CARE_PROVIDER_SITE_OTHER): Payer: Self-pay | Admitting: Physical Medicine and Rehabilitation

## 2018-12-08 NOTE — Telephone Encounter (Signed)
Please advise 

## 2018-12-15 ENCOUNTER — Ambulatory Visit (INDEPENDENT_AMBULATORY_CARE_PROVIDER_SITE_OTHER): Payer: Medicare Other | Admitting: *Deleted

## 2018-12-15 DIAGNOSIS — I4891 Unspecified atrial fibrillation: Secondary | ICD-10-CM | POA: Diagnosis not present

## 2018-12-15 DIAGNOSIS — Z5181 Encounter for therapeutic drug level monitoring: Secondary | ICD-10-CM

## 2018-12-15 LAB — POCT INR: INR: 2.4 (ref 2.0–3.0)

## 2018-12-15 NOTE — Patient Instructions (Signed)
Description   Continue taking 1/2 tablet daily except 1 tablet on Wednesdays and Saturdays. Eat a large leafy green vegetable once a week.  Recheck in 4 weeks. Call [phone] if scheduled for any procedures or on any new medications

## 2018-12-26 ENCOUNTER — Other Ambulatory Visit: Payer: Self-pay | Admitting: Cardiology

## 2019-01-05 ENCOUNTER — Telehealth (HOSPITAL_COMMUNITY): Payer: Self-pay

## 2019-01-05 NOTE — Telephone Encounter (Signed)
I agree. Please ask him to weigh daily to see if extra torsemide is helping. Have him limit fluid and salt intake. He can call us back if he doesn't have improvement in 2 days and we can reassess. Thanks

## 2019-01-05 NOTE — Telephone Encounter (Signed)
Pt called to report edema in both legs and wt gain (has not weighed himself), no SOB. I advised pt to take an extra torsemide for the next 2 days. Pt is currently taking torsemide 20 mg (Take 2 tablets (40 mg total) by mouth daily. Take extra 20 mg tablet once daily as needed for 3 lbs or more weight gain). Pt states that it will not probably work and wanted an appt ASAP. The earliest appt available is March 2nd so I put him on the schedule and agreed to speak to [place] about his symptoms. Please advise.

## 2019-01-05 NOTE — Telephone Encounter (Signed)
Pt notified Verbalizes understanding 

## 2019-01-12 ENCOUNTER — Ambulatory Visit (INDEPENDENT_AMBULATORY_CARE_PROVIDER_SITE_OTHER): Payer: Medicare Other | Admitting: Pharmacist

## 2019-01-12 DIAGNOSIS — I4891 Unspecified atrial fibrillation: Secondary | ICD-10-CM | POA: Diagnosis not present

## 2019-01-12 DIAGNOSIS — Z5181 Encounter for therapeutic drug level monitoring: Secondary | ICD-10-CM | POA: Diagnosis not present

## 2019-01-12 LAB — POCT INR: INR: 2.6 (ref 2.0–3.0)

## 2019-01-12 NOTE — Patient Instructions (Signed)
Description   Continue taking 1/2 tablet daily except 1 tablet on Wednesdays and Saturdays. Eat a large leafy green vegetable once a week.  Recheck in 6 weeks. Call [phone] if scheduled for any procedures or on any new medications

## 2019-01-17 ENCOUNTER — Ambulatory Visit (HOSPITAL_COMMUNITY)
Admission: RE | Admit: 2019-01-17 | Discharge: 2019-01-17 | Disposition: A | Discharge status: Home / Self Care | Payer: Medicare Other | Source: Ambulatory Visit | Attending: Internal Medicine | Admitting: Internal Medicine

## 2019-01-17 ENCOUNTER — Encounter (HOSPITAL_COMMUNITY): Payer: Self-pay

## 2019-01-17 VITALS — BP 114/58 | HR 66 | Wt 174.2 lb

## 2019-01-17 DIAGNOSIS — Z79899 Other long term (current) drug therapy: Secondary | ICD-10-CM | POA: Insufficient documentation

## 2019-01-17 DIAGNOSIS — I440 Atrioventricular block, first degree: Secondary | ICD-10-CM | POA: Diagnosis not present

## 2019-01-17 DIAGNOSIS — I341 Nonrheumatic mitral (valve) prolapse: Secondary | ICD-10-CM | POA: Insufficient documentation

## 2019-01-17 DIAGNOSIS — Z85820 Personal history of malignant melanoma of skin: Secondary | ICD-10-CM | POA: Insufficient documentation

## 2019-01-17 DIAGNOSIS — Z9889 Other specified postprocedural states: Secondary | ICD-10-CM | POA: Diagnosis not present

## 2019-01-17 DIAGNOSIS — Z8546 Personal history of malignant neoplasm of prostate: Secondary | ICD-10-CM | POA: Insufficient documentation

## 2019-01-17 DIAGNOSIS — Z8249 Family history of ischemic heart disease and other diseases of the circulatory system: Secondary | ICD-10-CM | POA: Insufficient documentation

## 2019-01-17 DIAGNOSIS — E7800 Pure hypercholesterolemia, unspecified: Secondary | ICD-10-CM | POA: Diagnosis not present

## 2019-01-17 DIAGNOSIS — Z7901 Long term (current) use of anticoagulants: Secondary | ICD-10-CM | POA: Insufficient documentation

## 2019-01-17 DIAGNOSIS — I4892 Unspecified atrial flutter: Secondary | ICD-10-CM | POA: Insufficient documentation

## 2019-01-17 DIAGNOSIS — G629 Polyneuropathy, unspecified: Secondary | ICD-10-CM | POA: Diagnosis not present

## 2019-01-17 DIAGNOSIS — I5082 Biventricular heart failure: Secondary | ICD-10-CM | POA: Insufficient documentation

## 2019-01-17 DIAGNOSIS — I493 Ventricular premature depolarization: Secondary | ICD-10-CM | POA: Diagnosis not present

## 2019-01-17 DIAGNOSIS — I110 Hypertensive heart disease with heart failure: Secondary | ICD-10-CM | POA: Insufficient documentation

## 2019-01-17 DIAGNOSIS — I5032 Chronic diastolic (congestive) heart failure: Secondary | ICD-10-CM

## 2019-01-17 DIAGNOSIS — Z952 Presence of prosthetic heart valve: Secondary | ICD-10-CM | POA: Diagnosis not present

## 2019-01-17 DIAGNOSIS — I480 Paroxysmal atrial fibrillation: Secondary | ICD-10-CM | POA: Insufficient documentation

## 2019-01-17 DIAGNOSIS — G5600 Carpal tunnel syndrome, unspecified upper limb: Secondary | ICD-10-CM | POA: Insufficient documentation

## 2019-01-17 DIAGNOSIS — M1990 Unspecified osteoarthritis, unspecified site: Secondary | ICD-10-CM | POA: Diagnosis not present

## 2019-01-17 DIAGNOSIS — I4891 Unspecified atrial fibrillation: Secondary | ICD-10-CM | POA: Diagnosis not present

## 2019-01-17 DIAGNOSIS — I340 Nonrheumatic mitral (valve) insufficiency: Secondary | ICD-10-CM | POA: Diagnosis not present

## 2019-01-17 DIAGNOSIS — I2729 Other secondary pulmonary hypertension: Secondary | ICD-10-CM | POA: Insufficient documentation

## 2019-01-17 DIAGNOSIS — I071 Rheumatic tricuspid insufficiency: Secondary | ICD-10-CM

## 2019-01-17 LAB — BASIC METABOLIC PANEL
Anion gap: 10 (ref 5–15)
BUN: 29 mg/dL — AB (ref 8–23)
CO2: 24 mmol/L (ref 22–32)
Calcium: 8.7 mg/dL — ABNORMAL LOW (ref 8.9–10.3)
Chloride: 107 mmol/L (ref 98–111)
Creatinine, Ser: 1.57 mg/dL — ABNORMAL HIGH (ref 0.61–1.24)
GFR calc Af Amer: 46 mL/min — ABNORMAL LOW (ref >60)
GFR calc non Af Amer: 40 mL/min — ABNORMAL LOW (ref >60)
GLUCOSE: 102 mg/dL — AB (ref 70–99)
POTASSIUM: 4.4 mmol/L (ref 3.5–5.1)
Sodium: 141 mmol/L (ref 135–145)

## 2019-01-17 NOTE — Progress Notes (Signed)
ReDS Vest - 01/17/19 1200      ReDS Vest   Fitting Posture  Sitting    Height Marker  Tall   station C   Ruler Value  30.5    Center Strip  Aligned    ReDS Value  22

## 2019-01-17 NOTE — Progress Notes (Signed)
Advanced Heart Failure Clinic Consult Note   Referring Physician: Dr. Irish Lack Primary Care: Penni Homans, MD Primary Cardiologist: Dr. Irish Lack ( Previously Mare Ferrari) Primary HF: Dr. Haroldine Laws  HPI: [name] [name] ([place]) is an 85 y.o. male with history of diastolic HF, PAF/Aflutter on coumadin, and h/o severe MV prolapse s/p MV repair 10/01/11 who is referred for further evaluation of his heart failure.   Pt seen in Dr. Irish Lack office 03/11/17 and noted to have worsening peripheral edema. Thought to be related to RV failure.  Pt admitted non-compliance with second lasix dose some days. RHC suggested but pt prefers to avoid invasive procedures at this time. Lasix increased and referred to [facility] for wound care of BLEs.   He was admitted 04/26/17-04/30/17 with a fall, no syncope. Also with right sided heart failure. RHC showed normal cardiac output. No evidence of PAH. Diuresed with IV lasix, weight down about 10 pounds. Discharge weight was 190 pounds. He was discharged to SNF.   He presents today for regular follow up. Last visit amio cut back with AVB. HR stable today. He has been struggling with peripheral edema. He has taken extra lasix for the past 3 days. Has been keeping weight around 170. Of note, weight was up to 180 about 10 days ago. He denies SOB, CP, orthopnea, or bendopnea. He is able to do all of his ADLs without difficulty. Denies any bleeding on coumadin. He is unsure what his baseline weight should be.   ECHO 09/04/2017  LVEF 55-60%  Severe TR, RV moderately to severely dilated. Flattened septum  Personally reviewed  Echo 12/19/16 with LVEF 50-55%, Mild AI, s/p MVR with mild residual MR, Mod LAE, Mod RV dilation, severe RAE, Severe TR.  RHC  RA = 8 RV = 36/7 PA = 33/4 (15) PCW = 6 Fick cardiac output/index = 7.7/3.6 PVR = 1.2 WU Ao sat = 99% PA sat = 69%, 72% SVC sat = 69%  Review of systems complete and found to be negative unless listed in HPI.   Past Medical  History:  Diagnosis Date  . Abnormality of gait 05/27/2016  . Adenomatous polyps   . Carpal tunnel syndrome 06/25/2016   Right  . Depressive disorder, not elsewhere classified   . First degree AV block    Holter 3/18: NSR, PACs, PVCs, no AFib, no pauses.  Marland Kitchen Hereditary and idiopathic peripheral neuropathy 06/25/2016  . Hypertension   . Internal nasal lesion 05/15/2013  . Melanoma ([place])    Left Shoulder  . Mitral regurgitation   . MVP (mitral valve prolapse)    a. With severe MR s/p Complex valvuloplasty including artificial Gore-tex neochord placement x4, chordal transposition x1, chordal release x1, # 32 mm Sorin Memo 3D Ring Annuloplasty 2012. // b. Echo 2/18: mild LVH, EF 50-55, mild AI, MV repair with mild MR, mod LAE, mod RVE, severe RAE, severe TR  . Neuropathy   . Normal coronary arteries    a. Normal coronary anatomy by cath 2012.  . Osteoarthritis    Knees  . PAF (paroxysmal atrial fibrillation) ([place])    a. Post-op MVR 2012.  Marland Kitchen Personal history of colonic polyps   . Prostate cancer ([place])   . Pulmonary HTN ([place])    a. Mild-mod by cath 2012.  . Pure hypercholesterolemia   . PVC (premature ventricular contraction)   . Thrombocytopenia ([place])   . Vision abnormalities    Cornea scarring   Current Outpatient Medications  Medication Sig Dispense Refill  .  amiodarone (PACERONE) 200 MG tablet Take 0.5 tablets (100 mg total) by mouth daily. 15 tablet 12  . b complex vitamins tablet Take 1 tablet by mouth daily.    . cycloSPORINE (RESTASIS) 0.05 % ophthalmic emulsion Place 1 drop into both eyes 2 (two) times daily.    Marland Kitchen diltiazem (CARDIZEM CD) 300 MG 24 hr capsule Take 1 capsule (300 mg total) by mouth daily. 90 capsule 3  . fluticasone (FLONASE) 50 MCG/ACT nasal spray SHAKE LQ AND U 2 SPRAYS IEN D  11  . gabapentin (NEURONTIN) 300 MG capsule TAKE 1 CAPSULE TWICE A DAY 180 capsule 4  . glucosamine-chondroitin 500-400 MG tablet Take 1 tablet by mouth 2 (two) times daily.    Marland Kitchen  linaclotide (LINZESS) 145 MCG CAPS capsule Take 1 capsule (145 mcg total) by mouth daily before breakfast. 90 capsule 0  . metoprolol succinate (TOPROL-XL) 50 MG 24 hr tablet TAKE 1 TABLET DAILY WITH OR IMMEDIATELY FOLLOWING A MEAL 90 tablet 4  . Multiple Vitamin (MULTIVITAMIN WITH MINERALS) TABS tablet Take 1 tablet by mouth every evening.     . potassium chloride SA (K-DUR,KLOR-CON) 20 MEQ tablet Take 2 tablets (40 mEq total) by mouth daily. 180 tablet 3  . rosuvastatin (CRESTOR) 5 MG tablet TAKE 1 TABLET DAILY 90 tablet 1  . tolnaftate (ANTIFUNGAL SPRAY POWDER) 1 % spray Apply topically 2 (two) times daily. 130 g 0  . torsemide (DEMADEX) 20 MG tablet Take 2 tablets (40 mg total) by mouth daily. Take extra 20 mg tablet once daily as needed for 3 lbs or more weight gain. 180 tablet 3  . warfarin (COUMADIN) 5 MG tablet TAKE 1 TABLET DAILY AS DIRECTED BY THE COUMADIN CLINIC 60 tablet 1  . traZODone (DESYREL) 100 MG tablet Take 300 mg by mouth at bedtime.      No current facility-administered medications for this encounter.    No Known Allergies   Social History   Socioeconomic History  . Marital status: Widowed    Spouse name: Not on file  . Number of children: 2  . Years of education: 31  . Highest education level: Not on file  Occupational History  . Not on file  Social Needs  . Financial resource strain: Not on file  . Food insecurity:    Worry: Not on file    Inability: Not on file  . Transportation needs:    Medical: Not on file    Non-medical: Not on file  Tobacco Use  . Smoking status: Never Smoker  . Smokeless tobacco: Never Used  Substance and Sexual Activity  . Alcohol use: No    Alcohol/week: 0.0 standard drinks    Comment: Last drink in 2000  . Drug use: No  . Sexual activity: Not on file  Lifestyle  . Physical activity:    Days per week: Not on file    Minutes per session: Not on file  . Stress: Not on file  Relationships  . Social connections:    Talks on  phone: Not on file    Gets together: Not on file    Attends religious service: Not on file    Active member of club or organization: Not on file    Attends meetings of clubs or organizations: Not on file    Relationship status: Not on file  . Intimate partner violence:    Fear of current or ex partner: Not on file    Emotionally abused: Not on file  Physically abused: Not on file    Forced sexual activity: Not on file  Other Topics Concern  . Not on file  Social History Narrative   Retired - Optometrist   Widower   2 children (one in [place] and on one in [place])    Drinks 1 cup of coffee per day   Family History  Problem Relation Age of Onset  . Clotting disorder Brother        CVA's  . Arthritis Mother   . Hypertension Mother   . Stroke Mother   . Hypertension Father   . Psychosis Father        psychiatric care  . Colon cancer Neg Hx   . Stomach cancer Neg Hx   . Heart attack Neg Hx    Vitals:   01/17/19 1208  BP: (!) 114/58  Pulse: 66  SpO2: 92%  Weight: 79 kg (174 lb 3.2 oz)   Wt Readings from Last 3 Encounters:  01/17/19 79 kg (174 lb 3.2 oz)  11/01/18 77.1 kg (170 lb)  09/14/18 72.1 kg (159 lb)    PHYSICAL EXAM: General: Elderly appearing. No resp difficulty. HEENT: Normal Neck: Supple. JVP 6-7 cm. Carotids 2+ bilat; no bruits. No thyromegaly or nodule noted. Cor: PMI nondisplaced. RRR, No M/G/R noted Lungs: CTAB, normal effort. Abdomen: Soft, non-tender, non-distended, no HSM. No bruits or masses. +BS  Extremities: No cyanosis, clubbing, or rash. 1-2+ ankle edema with chronic venous stasis changes. Neuro: Alert & orientedx3, cranial nerves grossly intact. moves all 4 extremities w/o difficulty. Affect pleasant   ASSESSMENT & PLAN: 1. Combined diastolic HF with R>L symptoms in the setting of longstanding MV disease.  - NYHA III symptoms - Volume status looks OK on exam, despite peripheral edema.  ReDs vest 22%.  - Continue torsemide 40 mg daily.  BMET today.  - Continue Toprol XL 25 mg daily.(Recently decreased by PCP)   2. PAF - Remains in NSR on ECG  - Continue diltiazem 300mg .  - Continue amio 100mg  daily with 1AVB - Continue warfarin for anticoagulation. Will not switch to DOAC with h/o MV repair  3. Severe TR - No change.  - Not candidate for TVR due to age and comorbidities. Volume status well controlled with medical therapy.   4. [place] - RHC 6/18 without severe PAH.  (RA 8, PA 33/4 (15), CI 3.6) - No change.   5. Severe MVP S/P Bioprosthetic MVR  - Stable.  - He is aware of need for SBE prophylaxis  6. Marked 1AVB - Toprol and amio previously cut back - May need to cut diltiazem back as well  - Follow closely for possible PPM need.   He is doing well overall. Suspect his edema is mostly chronic venous stasis, and that he may even be a little dry centrally given ReDs vest 22% and lack of JVD especially in setting of TR. BMET today. Keep 6 week appt.   Shirley Friar, PA-C 01/17/19   Greater than 50% of the 25 minute visit was spent in counseling/coordination of care regarding disease state education, salt/fluid restriction, sliding scale diuretics, and medication compliance.

## 2019-01-17 NOTE — Patient Instructions (Signed)
No medication changes today!!!  Labs today We will only contact you if something comes back abnormal or we need to make some changes. Otherwise no news is good news!  Your physician recommends that you schedule a follow-up appointment in: Keep your upcoming appointment with Dr. Haroldine Laws  Do the following things EVERYDAY: 1) Weigh yourself in the morning before breakfast. Write it down and keep it in a log. 2) Take your medicines as prescribed 3) Eat low salt foods-Limit salt (sodium) to 2000 mg per day.  4) Stay as active as you can everyday 5) Limit all fluids for the day to less than 2 liters  .

## 2019-01-25 ENCOUNTER — Telehealth: Payer: Self-pay | Admitting: Family Medicine

## 2019-01-25 NOTE — Telephone Encounter (Signed)
Am willing to change him to Trulance 3 mg tab, 1 tab po daily dsp #30 with 2 rf to see if it works better. D/c Linzess

## 2019-01-25 NOTE — Telephone Encounter (Signed)
Copied from [place] [phone]. Topic: Quick Communication - See Telephone Encounter >> Jan 25, 2019 10:44 AM Bea Graff, NT wrote: CRM for notification. See Telephone encounter for: 01/25/19. Pt states that the linaclotide Rolan Lipa) is expensive and he would like to see if it can be changed to Trulance 3MG ? Please advise.

## 2019-01-26 MED ORDER — PLECANATIDE 3 MG PO TABS: 3.0000 mg | ORAL_TABLET | Freq: Every day | ORAL | 0 refills | Status: DC

## 2019-01-26 NOTE — Telephone Encounter (Signed)
Notified pt. Rx sent to pharmacy.  

## 2019-02-03 ENCOUNTER — Other Ambulatory Visit: Payer: Self-pay | Admitting: Urology

## 2019-02-03 DIAGNOSIS — R9721 Rising PSA following treatment for malignant neoplasm of prostate: Secondary | ICD-10-CM

## 2019-02-04 ENCOUNTER — Encounter: Payer: Self-pay | Admitting: Radiation Oncology

## 2019-02-22 ENCOUNTER — Telehealth: Payer: Self-pay

## 2019-02-22 ENCOUNTER — Ambulatory Visit: Payer: Medicare Other

## 2019-02-22 ENCOUNTER — Institutional Professional Consult (permissible substitution): Payer: Medicare Other | Admitting: Radiation Oncology

## 2019-02-22 ENCOUNTER — Encounter: Payer: Self-pay | Admitting: *Deleted

## 2019-02-22 NOTE — Telephone Encounter (Signed)

## 2019-02-23 ENCOUNTER — Ambulatory Visit (INDEPENDENT_AMBULATORY_CARE_PROVIDER_SITE_OTHER): Payer: Medicare Other

## 2019-02-23 ENCOUNTER — Other Ambulatory Visit: Payer: Self-pay

## 2019-02-23 DIAGNOSIS — Z5181 Encounter for therapeutic drug level monitoring: Secondary | ICD-10-CM

## 2019-02-23 DIAGNOSIS — I4891 Unspecified atrial fibrillation: Secondary | ICD-10-CM

## 2019-02-23 DIAGNOSIS — I480 Paroxysmal atrial fibrillation: Secondary | ICD-10-CM

## 2019-02-23 LAB — POCT INR: INR: 3.9 — AB (ref 2.0–3.0)

## 2019-02-23 NOTE — Patient Instructions (Signed)
Description   Called spoke with pt, advised to skip today's dosage of Warfarin, then resume same dosage 1/2 tablet daily except 1 tablet on Wednesdays and Saturdays.  Recheck in 4 weeks. Call [phone] if scheduled for any procedures or on any new medications

## 2019-02-28 ENCOUNTER — Ambulatory Visit (HOSPITAL_COMMUNITY)
Admission: RE | Admit: 2019-02-28 | Discharge: 2019-02-28 | Disposition: A | Discharge status: Home / Self Care | Payer: Medicare Other | Source: Ambulatory Visit | Attending: Urology | Admitting: Urology

## 2019-02-28 ENCOUNTER — Other Ambulatory Visit: Payer: Self-pay

## 2019-02-28 ENCOUNTER — Encounter (HOSPITAL_COMMUNITY)
Admission: RE | Admit: 2019-02-28 | Discharge: 2019-02-28 | Disposition: A | Discharge status: Home / Self Care | Payer: Medicare Other | Source: Ambulatory Visit | Attending: Urology | Admitting: Urology

## 2019-02-28 DIAGNOSIS — R9721 Rising PSA following treatment for malignant neoplasm of prostate: Secondary | ICD-10-CM | POA: Diagnosis not present

## 2019-02-28 MED ORDER — TECHNETIUM TC 99M MEDRONATE IV KIT
20.9000 | PACK | Freq: Once | INTRAVENOUS | Status: AC | PRN
  Administered 2019-02-28: 20.9 via INTRAVENOUS

## 2019-03-01 ENCOUNTER — Ambulatory Visit: Payer: Medicare Other

## 2019-03-01 ENCOUNTER — Ambulatory Visit: Admission: RE | Admit: 2019-03-01 | Payer: Medicare Other | Source: Ambulatory Visit

## 2019-03-01 ENCOUNTER — Other Ambulatory Visit: Payer: Self-pay

## 2019-03-01 ENCOUNTER — Ambulatory Visit: Payer: Medicare Other | Admitting: Radiation Oncology

## 2019-03-02 ENCOUNTER — Encounter: Payer: Self-pay | Admitting: *Deleted

## 2019-03-03 ENCOUNTER — Other Ambulatory Visit: Payer: Self-pay

## 2019-03-03 ENCOUNTER — Ambulatory Visit (INDEPENDENT_AMBULATORY_CARE_PROVIDER_SITE_OTHER): Payer: Medicare Other | Admitting: Family Medicine

## 2019-03-03 VITALS — BP 113/64 | HR 56

## 2019-03-03 DIAGNOSIS — Z8546 Personal history of malignant neoplasm of prostate: Secondary | ICD-10-CM

## 2019-03-03 DIAGNOSIS — I480 Paroxysmal atrial fibrillation: Secondary | ICD-10-CM

## 2019-03-03 DIAGNOSIS — R600 Localized edema: Secondary | ICD-10-CM

## 2019-03-03 DIAGNOSIS — I5032 Chronic diastolic (congestive) heart failure: Secondary | ICD-10-CM

## 2019-03-03 DIAGNOSIS — E785 Hyperlipidemia, unspecified: Secondary | ICD-10-CM

## 2019-03-03 DIAGNOSIS — I10 Essential (primary) hypertension: Secondary | ICD-10-CM

## 2019-03-03 DIAGNOSIS — E7800 Pure hypercholesterolemia, unspecified: Secondary | ICD-10-CM

## 2019-03-03 DIAGNOSIS — K5901 Slow transit constipation: Secondary | ICD-10-CM

## 2019-03-03 NOTE — Assessment & Plan Note (Signed)
Encouraged to elevate feet above heart for 15 minutes 3 x daily, continue compression hose and minimize sodium in diet. Continue diuretic as prescribed

## 2019-03-03 NOTE — Assessment & Plan Note (Addendum)
Tolerating coumadin and following with cardiology. Rate controlled

## 2019-03-03 NOTE — Patient Instructions (Signed)
Encouraged increased hydration and fiber in diet. Daily probiotics. If bowels not moving can use MOM 2 tbls po in 4 oz of warm prune juice by mouth every 2-3 days. If no results then repeat in 4 hours with  Dulcolax suppository pr, may repeat again in 4 more hours as needed. Seek care if symptoms worsen. Consider daily Miralax and/or Dulcolax if symptoms persist.   miralax with benefiber once to twice a day

## 2019-03-06 NOTE — Assessment & Plan Note (Signed)
Tolerating statin, encouraged heart healthy diet, avoid trans fats, minimize simple carbs and saturated fats. Increase exercise as tolerated 

## 2019-03-06 NOTE — Assessment & Plan Note (Signed)
Well controlled, no changes to meds. Encouraged heart healthy diet such as the DASH diet and exercise as tolerated.  °

## 2019-03-06 NOTE — Assessment & Plan Note (Signed)
Encouraged increased hydration and fiber in diet. Daily probiotics. If bowels not moving can use MOM 2 tbls po in 4 oz of warm prune juice by mouth every 2-3 days. If no results then repeat in 4 hours with  Dulcolax suppository pr, may repeat again in 4 more hours as needed. Seek care if symptoms worsen. Consider daily Miralax and/or Dulcolax if symptoms persist. miralax with benefiber once to twice daily. OK to use Pericolace

## 2019-03-06 NOTE — Assessment & Plan Note (Signed)
Was treated over 25 years ago then recently his PSA started increasing so he is now following closely with urology again. No concerning symptoms.

## 2019-03-06 NOTE — Progress Notes (Signed)
Virtual Visit via Telepone Note  I connected with [name] [name] on 03/06/19 at 10:00 AM EDT by a telephone enabled telemedicine application and verified that I am speaking with the correct person using two identifiers.   I discussed the limitations of evaluation and management by telemedicine and the availability of in person appointments. The patient expressed understanding and agreed to proceed. [name] [name] was able to get patient set up on video platform but it failed so visit was completed on telephone.    Subjective:    Patient ID: [name] [name], male    DOB: 25-Jul-1931, 85 y.o.   MRN: [id number]  CC: constipation  HPI Patient is in today for evaluation of constipation and follow up on chronic medical concerns including afib, h/o prostate cancer, hyperlipidemia, hypertension, and more. He is interviewed from his home. He feels well. Is following with cardiology to monitor his coumadin and has had no complications. Denies CP/palp/SOB/HA/congestion/fevers. Taking meds as prescribed. He notes going as long as a week between BM's a dose of generic pericolace was some helpful but symptoms persist. No bloody or tarry stool.  Past Medical History:  Diagnosis Date  . Abnormality of gait 05/27/2016  . Adenomatous polyps   . Carpal tunnel syndrome 06/25/2016   Right  . Depressive disorder, not elsewhere classified   . First degree AV block    Holter 3/18: NSR, PACs, PVCs, no AFib, no pauses.  Marland Kitchen Hereditary and idiopathic peripheral neuropathy 06/25/2016  . Hypertension   . Internal nasal lesion 05/15/2013  . Melanoma ([place])    Left Shoulder  . Mitral regurgitation   . MVP (mitral valve prolapse)    a. With severe MR s/p Complex valvuloplasty including artificial Gore-tex neochord placement x4, chordal transposition x1, chordal release x1, # 32 mm Sorin Memo 3D Ring Annuloplasty 2012. // b. Echo 2/18: mild LVH, EF 50-55, mild AI, MV repair with mild MR, mod LAE, mod RVE, severe RAE, severe  TR  . Neuropathy   . Normal coronary arteries    a. Normal coronary anatomy by cath 2012.  . Osteoarthritis    Knees  . PAF (paroxysmal atrial fibrillation) ([place])    a. Post-op MVR 2012.  Marland Kitchen Personal history of colonic polyps   . Prostate cancer ([place])   . Pulmonary HTN ([place])    a. Mild-mod by cath 2012.  . Pure hypercholesterolemia   . PVC (premature ventricular contraction)   . Thrombocytopenia ([place])   . Vision abnormalities    Cornea scarring    Past Surgical History:  Procedure Laterality Date  . CARDIAC CATHETERIZATION  09/2011   Pre-op for MVR -- normal coronaries.  Marland Kitchen CARDIOVERSION N/A 01/02/2016   Procedure: CARDIOVERSION;  Surgeon: Thayer Headings, MD;  Location: [facility] ENDOSCOPY;  Service: Cardiovascular;  Laterality: N/A;  . COLONOSCOPY W/ POLYPECTOMY    . INGUINAL HERNIA REPAIR  09/2009   Left  . KNEE ARTHROSCOPY      left x3  and right x2  . Melanoma Surgery     2001, 2005, 2006, 2009  . MITRAL VALVE REPAIR  10/01/2011   complex valvuloplasty with Goretex cord replacement and chordal transposition 20mm Sorin Memo 3D ring annuloplasty  . Nuclear Stress Test  09/2006   EF-64%, Normal  . PROSTATECTOMY  1993  . RIGHT HEART CATH N/A 04/29/2017   Procedure: Right Heart Cath;  Surgeon: Jolaine Artist, MD;  Location: [place] CV LAB;  Service: Cardiovascular;  Laterality: N/A;  . ROOT CANAL  08-19-12  . ROTATOR CUFF REPAIR  2003   left  . TEE WITHOUT CARDIOVERSION  09/26/2011   Procedure: TRANSESOPHAGEAL ECHOCARDIOGRAM (TEE);  Surgeon: Lelon Perla, MD;  Location: [facility] ENDOSCOPY;  Service: Cardiovascular;  Laterality: N/A;  . US ECHOCARDIOGRAPHY  09/2009, 08/1011   mild LVH,mild AI,MVP with mild MR, mild-mod. TR with mild Pulm. HTN, EF-55-60%    Family History  Problem Relation Age of Onset  . Clotting disorder Brother        CVA's  . Arthritis Mother   . Hypertension Mother   . Stroke Mother   . Hypertension Father   . Psychosis Father        psychiatric  care  . Colon cancer Neg Hx   . Stomach cancer Neg Hx   . Heart attack Neg Hx     Social History   Socioeconomic History  . Marital status: Widowed    Spouse name: Not on file  . Number of children: 2  . Years of education: 54  . Highest education level: Not on file  Occupational History  . Not on file  Social Needs  . Financial resource strain: Not on file  . Food insecurity:    Worry: Not on file    Inability: Not on file  . Transportation needs:    Medical: Not on file    Non-medical: Not on file  Tobacco Use  . Smoking status: Never Smoker  . Smokeless tobacco: Never Used  Substance and Sexual Activity  . Alcohol use: No    Alcohol/week: 0.0 standard drinks    Comment: Last drink in 2000  . Drug use: No  . Sexual activity: Not on file  Lifestyle  . Physical activity:    Days per week: Not on file    Minutes per session: Not on file  . Stress: Not on file  Relationships  . Social connections:    Talks on phone: Not on file    Gets together: Not on file    Attends religious service: Not on file    Active member of club or organization: Not on file    Attends meetings of clubs or organizations: Not on file    Relationship status: Not on file  . Intimate partner violence:    Fear of current or ex partner: Not on file    Emotionally abused: Not on file    Physically abused: Not on file    Forced sexual activity: Not on file  Other Topics Concern  . Not on file  Social History Narrative   Retired - Optometrist   Widower   2 children (one in [place] and on one in [place])    Drinks 1 cup of coffee per day    Outpatient Medications Prior to Visit  Medication Sig Dispense Refill  . amiodarone (PACERONE) 200 MG tablet Take 0.5 tablets (100 mg total) by mouth daily. 15 tablet 12  . b complex vitamins tablet Take 1 tablet by mouth daily.    . cycloSPORINE (RESTASIS) 0.05 % ophthalmic emulsion Place 1 drop into both eyes 2 (two) times daily.    Marland Kitchen diltiazem  (CARDIZEM CD) 300 MG 24 hr capsule Take 1 capsule (300 mg total) by mouth daily. 90 capsule 3  . fluticasone (FLONASE) 50 MCG/ACT nasal spray SHAKE LQ AND U 2 SPRAYS IEN D  11  . gabapentin (NEURONTIN) 300 MG capsule TAKE 1 CAPSULE TWICE A DAY 180 capsule 4  . glucosamine-chondroitin 500-400 MG tablet Take  1 tablet by mouth 2 (two) times daily.    Marland Kitchen linaclotide (LINZESS) 145 MCG CAPS capsule Take 1 capsule (145 mcg total) by mouth daily before breakfast. 90 capsule 0  . metoprolol succinate (TOPROL-XL) 50 MG 24 hr tablet TAKE 1 TABLET DAILY WITH OR IMMEDIATELY FOLLOWING A MEAL 90 tablet 4  . Multiple Vitamin (MULTIVITAMIN WITH MINERALS) TABS tablet Take 1 tablet by mouth every evening.     Marland Kitchen Plecanatide (TRULANCE) 3 MG TABS Take 3 mg by mouth daily. 90 tablet 0  . potassium chloride SA (K-DUR,KLOR-CON) 20 MEQ tablet Take 2 tablets (40 mEq total) by mouth daily. 180 tablet 3  . rosuvastatin (CRESTOR) 5 MG tablet TAKE 1 TABLET DAILY 90 tablet 1  . tolnaftate (ANTIFUNGAL SPRAY POWDER) 1 % spray Apply topically 2 (two) times daily. 130 g 0  . torsemide (DEMADEX) 20 MG tablet Take 2 tablets (40 mg total) by mouth daily. Take extra 20 mg tablet once daily as needed for 3 lbs or more weight gain. 180 tablet 3  . traZODone (DESYREL) 100 MG tablet Take 300 mg by mouth at bedtime.     Marland Kitchen warfarin (COUMADIN) 5 MG tablet TAKE 1 TABLET DAILY AS DIRECTED BY THE COUMADIN CLINIC 60 tablet 1   No facility-administered medications prior to visit.     No Known Allergies  Review of Systems  Constitutional: Positive for malaise/fatigue. Negative for fever.  HENT: Negative for congestion.   Eyes: Negative for blurred vision.  Respiratory: Negative for shortness of breath.   Cardiovascular: Positive for leg swelling. Negative for chest pain and palpitations.  Gastrointestinal: Positive for constipation. Negative for abdominal pain, blood in stool, heartburn, melena and nausea.  Genitourinary: Positive for  frequency. Negative for dysuria.  Musculoskeletal: Negative for falls.  Skin: Negative for rash.  Neurological: Negative for dizziness, loss of consciousness and headaches.  Endo/Heme/Allergies: Negative for environmental allergies.  Psychiatric/Behavioral: Negative for depression. The patient is not nervous/anxious.        Objective:    Physical Exam Unable to obtain due to telephone encounter  BP 113/64   Pulse (!) 56  Wt Readings from Last 3 Encounters:  01/17/19 174 lb 3.2 oz (79 kg)  11/01/18 170 lb (77.1 kg)  09/14/18 159 lb (72.1 kg)    Diabetic Foot Exam - Simple   No data filed     Lab Results  Component Value Date   WBC 6.9 11/01/2018   HGB 12.9 (L) 11/01/2018   HCT 38.9 (L) 11/01/2018   PLT 78.0 (L) 11/01/2018   GLUCOSE 102 (H) 01/17/2019   CHOL 103 11/25/2017   TRIG 60.0 11/25/2017   HDL 46.80 11/25/2017   LDLCALC 44 11/25/2017   ALT 18 11/01/2018   AST 16 11/01/2018   NA 141 01/17/2019   K 4.4 01/17/2019   CL 107 01/17/2019   CREATININE 1.57 (H) 01/17/2019   BUN 29 (H) 01/17/2019   CO2 24 01/17/2019   TSH 0.99 11/01/2018   PSA 0.07 (L) 09/17/2010   INR 3.9 (A) 02/23/2019   HGBA1C 6.0 (H) 04/03/2014    Lab Results  Component Value Date   TSH 0.99 11/01/2018   Lab Results  Component Value Date   WBC 6.9 11/01/2018   HGB 12.9 (L) 11/01/2018   HCT 38.9 (L) 11/01/2018   MCV 89.5 11/01/2018   PLT 78.0 (L) 11/01/2018   Lab Results  Component Value Date   NA 141 01/17/2019   K 4.4 01/17/2019   CO2 24 01/17/2019  GLUCOSE 102 (H) 01/17/2019   BUN 29 (H) 01/17/2019   CREATININE 1.57 (H) 01/17/2019   BILITOT 0.7 11/01/2018   ALKPHOS 51 11/01/2018   AST 16 11/01/2018   ALT 18 11/01/2018   PROT 6.7 11/01/2018   ALBUMIN 4.0 11/01/2018   CALCIUM 8.7 (L) 01/17/2019   ANIONGAP 10 01/17/2019   GFR 59.36 (L) 11/01/2018   Lab Results  Component Value Date   CHOL 103 11/25/2017   Lab Results  Component Value Date   HDL 46.80 11/25/2017    Lab Results  Component Value Date   LDLCALC 44 11/25/2017   Lab Results  Component Value Date   TRIG 60.0 11/25/2017   Lab Results  Component Value Date   CHOLHDL 2 11/25/2017   Lab Results  Component Value Date   HGBA1C 6.0 (H) 04/03/2014       Assessment & Plan:   Problem List Items Addressed This Visit    Atrial fibrillation ([place]) (Chronic)    Tolerating coumadin and following with cardiology. Rate controlled      Chronic diastolic heart failure ([facility]) (Chronic)    No recent exacerbation. No change in meds      HYPERCHOLESTEROLEMIA    Tolerating statin, encouraged heart healthy diet, avoid trans fats, minimize simple carbs and saturated fats. Increase exercise as tolerated      HYPERTENSION, BENIGN ESSENTIAL - Primary    Well controlled, no changes to meds. Encouraged heart healthy diet such as the DASH diet and exercise as tolerated.       Relevant Orders   CBC   Comprehensive metabolic panel   TSH   PERSONAL HISTORY MALIGNANT NEOPLASM PROSTATE    Was treated over 25 years ago then recently his PSA started increasing so he is now following closely with urology again. No concerning symptoms.      Constipation    Encouraged increased hydration and fiber in diet. Daily probiotics. If bowels not moving can use MOM 2 tbls po in 4 oz of warm prune juice by mouth every 2-3 days. If no results then repeat in 4 hours with  Dulcolax suppository pr, may repeat again in 4 more hours as needed. Seek care if symptoms worsen. Consider daily Miralax and/or Dulcolax if symptoms persist. miralax with benefiber once to twice daily. OK to use Pericolace      Bilateral lower extremity edema    Encouraged to elevate feet above heart for 15 minutes 3 x daily, continue compression hose and minimize sodium in diet. Continue diuretic as prescribed       Other Visit Diagnoses    Hyperlipidemia, unspecified hyperlipidemia type       Relevant Orders   Lipid panel      I am having  Meta Hatchet. [place] maintain his traZODone, multivitamin with minerals, glucosamine-chondroitin, b complex vitamins, cycloSPORINE, potassium chloride SA, tolnaftate, torsemide, fluticasone, warfarin, amiodarone, diltiazem, rosuvastatin, linaclotide, gabapentin, metoprolol succinate, and Plecanatide.  No orders of the defined types were placed in this encounter.   The patient was advised to call back or seek an in-person evaluation if the symptoms worsen or if the condition fails to improve as anticipated.    I discussed the assessment and treatment plan with the patient. The patient was provided an opportunity to ask questions and all were answered. The patient agreed with the plan and demonstrated an understanding of the instructions.   The patient was advised to call back or seek an in-person evaluation if the symptoms worsen or if the  condition fails to improve as anticipated.  I provided 15 minutes of non-face-to-face time during this encounter.   Penni Homans, MD

## 2019-03-06 NOTE — Assessment & Plan Note (Signed)
No recent exacerbation. No change in meds

## 2019-03-07 ENCOUNTER — Encounter (HOSPITAL_COMMUNITY): Payer: Medicare Other

## 2019-03-08 ENCOUNTER — Ambulatory Visit (HOSPITAL_COMMUNITY): Payer: Medicare Other

## 2019-03-08 ENCOUNTER — Telehealth: Payer: Self-pay | Admitting: *Deleted

## 2019-03-08 ENCOUNTER — Encounter (HOSPITAL_COMMUNITY): Payer: Medicare Other

## 2019-03-08 NOTE — Telephone Encounter (Signed)
Received CT Chest, Abdomen and Pelvis Imaging Results from [facility]; forwarded to provider/SLS 04/21

## 2019-03-10 ENCOUNTER — Telehealth (HOSPITAL_COMMUNITY): Payer: Medicare Other | Admitting: Internal Medicine

## 2019-03-11 ENCOUNTER — Encounter: Payer: Self-pay | Admitting: Radiation Oncology

## 2019-03-11 ENCOUNTER — Ambulatory Visit
Admission: RE | Admit: 2019-03-11 | Discharge: 2019-03-11 | Disposition: A | Discharge status: Home / Self Care | Payer: Medicare Other | Source: Ambulatory Visit | Attending: Radiation Oncology | Admitting: Radiation Oncology

## 2019-03-11 ENCOUNTER — Other Ambulatory Visit: Payer: Self-pay

## 2019-03-11 VITALS — Ht 77.0 in | Wt 173.0 lb

## 2019-03-11 DIAGNOSIS — C61 Malignant neoplasm of prostate: Secondary | ICD-10-CM

## 2019-03-11 NOTE — Progress Notes (Signed)
Radiation Oncology         [phone] ________________________________  Initial Telephone Consultation - Conducted via telephone due to current COVID-19 concerns for limiting patient exposure  Name: [name] [name] MRN: [id number]  Date: 03/11/2019  DOB: September 18, 1931  [id number], Bonnita Levan, MD  Davis Gourd*   REFERRING PHYSICIAN: Davis Gourd*  DIAGNOSIS: 85 y.o. gentleman with biochemical recurrence of Gleason 3+4 adenocarcinoma of the prostate with current PSA of 1.44 in 01/2019 s/p RRP 10/1992.    ICD-10-CM   1. ADENOCARCINOMA, PROSTATE C61 NM PET (AXUMIN) SKULL BASE TO MID THIGH    HISTORY OF PRESENT ILLNESS: [name] [name] is a 85 y.o. male with a diagnosis of prostate cancer. He was initially diagnosed in 09/1992 with Gleason 3+4 disease and a PSA of 6.5 at the time of his biopsy. He opted to undergo a radical retropubic prostatectomy with BPLND on 10/31/1992 under the care and direction of Dr. Luanne Bras. His pathology was favorable with negative margins, no ECE, no SVI and no lymph nodes involved.  His postoperative PSA was undetectable and remained undetectable until around 2015 when it was noted at 0.23 during a routine follow up visit with Dr. Rana Snare. His PSA has been followed closely since that time, showing a very slow rise over time at 0.29 in January 2016, 0.34 in July 2017, 0.57 in July 2018 and 0.63 on 06/23/2018. However, unfortunately, his most recent PSA at the time of follow up visit with Dr. Lovena Neighbours on 01/31/2019 was 1.44, more than double in 6 months.  He proceeded with a chest/abdomen/pelvis CT scan on 02/09/2019 for disease restaging, with results showing no obvious concerning findings for metastatic disease.  There was a mildly enlarged right obturator node which appeared stable as compared with prior CT scan from 2015 and favored to be benign.  A Bone scan on 02/28/2019 was negative for evidence of osseous metastatic disease.  The patient  reviewed the recent imaging and PSA results with his urologist and he has kindly been referred today for discussion of potential salvage radiation treatment options.  PREVIOUS RADIATION THERAPY: No  PAST MEDICAL HISTORY:  Past Medical History:  Diagnosis Date   Abnormality of gait 05/27/2016   Adenomatous polyps    Carpal tunnel syndrome 06/25/2016   Right   Depressive disorder, not elsewhere classified    First degree AV block    Holter 3/18: NSR, PACs, PVCs, no AFib, no pauses.   Hereditary and idiopathic peripheral neuropathy 06/25/2016   Hypertension    Internal nasal lesion 05/15/2013   Melanoma ([place])    Left Shoulder   Mitral regurgitation    MVP (mitral valve prolapse)    a. With severe MR s/p Complex valvuloplasty including artificial Gore-tex neochord placement x4, chordal transposition x1, chordal release x1, # 32 mm Sorin Memo 3D Ring Annuloplasty 2012. // b. Echo 2/18: mild LVH, EF 50-55, mild AI, MV repair with mild MR, mod LAE, mod RVE, severe RAE, severe TR   Neuropathy    Normal coronary arteries    a. Normal coronary anatomy by cath 2012.   Osteoarthritis    Knees   PAF (paroxysmal atrial fibrillation) ([place])    a. Post-op MVR 2012.   Personal history of colonic polyps    Prostate cancer ([place])    Pulmonary HTN ([place])    a. Mild-mod by cath 2012.   Pure hypercholesterolemia    PVC (premature ventricular contraction)    Thrombocytopenia ([facility])    Vision  abnormalities    Cornea scarring      PAST SURGICAL HISTORY: Past Surgical History:  Procedure Laterality Date   CARDIAC CATHETERIZATION  09/2011   Pre-op for MVR -- normal coronaries.   CARDIOVERSION N/A 01/02/2016   Procedure: CARDIOVERSION;  Surgeon: Thayer Headings, MD;  Location: [facility];  Service: Cardiovascular;  Laterality: N/A;   COLONOSCOPY W/ POLYPECTOMY     INGUINAL HERNIA REPAIR  09/2009   Left   KNEE ARTHROSCOPY      left x3  and right x2   Melanoma Surgery      2001, 2005, 2006, 2009   MITRAL VALVE REPAIR  10/01/2011   complex valvuloplasty with Goretex cord replacement and chordal transposition 46mm Sorin Memo 3D ring annuloplasty   Nuclear Stress Test  09/2006   EF-64%, Normal   PROSTATECTOMY  1993   RIGHT HEART CATH N/A 04/29/2017   Procedure: Right Heart Cath;  Surgeon: Jolaine Artist, MD;  Location: [facility];  Service: Cardiovascular;  Laterality: N/A;   ROOT CANAL  08-19-12   ROTATOR CUFF REPAIR  2003   left   TEE WITHOUT CARDIOVERSION  09/26/2011   Procedure: TRANSESOPHAGEAL ECHOCARDIOGRAM (TEE);  Surgeon: Lelon Perla, MD;  Location[facility];  Service: Cardiovascular;  Laterality: N/A;   US ECHOCARDIOGRAPHY  09/2009, 08/1011   mild LVH,mild AI,MVP with mild MR, mild-mod. TR with mild Pulm. HTN, EF-55-60%    FAMILY HISTORY:  Family History  Problem Relation Age of Onset   Clotting disorder Brother        CVA's   Arthritis Mother    Hypertension Mother    Stroke Mother    Hypertension Father    Psychosis Father        psychiatric care   Colon cancer Neg Hx    Stomach cancer Neg Hx    Heart attack Neg Hx    Prostate cancer Neg Hx    Pancreatic cancer Neg Hx     SOCIAL HISTORY:  Social History   Socioeconomic History   Marital status: Widowed    Spouse name: Not on file   Number of children: 2   Years of education: 51   Highest education level: Not on file  Occupational History    Comment: retired  Scientist, product/process development strain: Not on file   Food insecurity:    Worry: Not on file    Inability: Not on file   Transportation needs:    Medical: Not on file    Non-medical: Not on file  Tobacco Use   Smoking status: Never Smoker   Smokeless tobacco: Never Used  Substance and Sexual Activity   Alcohol use: No    Alcohol/week: 0.0 standard drinks    Comment: Last drink in 2000   Drug use: No   Sexual activity: Not Currently  Lifestyle   Physical  activity:    Days per week: Not on file    Minutes per session: Not on file   Stress: Not on file  Relationships   Social connections:    Talks on phone: Not on file    Gets together: Not on file    Attends religious service: Not on file    Active member of club or organization: Not on file    Attends meetings of clubs or organizations: Not on file    Relationship status: Not on file   Intimate partner violence:    Fear of current or ex partner: Not on  file    Emotionally abused: Not on file    Physically abused: Not on file    Forced sexual activity: Not on file  Other Topics Concern   Not on file  Social History Narrative   Retired - Optometrist   Widower   2 children (one in [place] and on one in [place])    Drinks 1 cup of coffee per day    ALLERGIES: Patient has no known allergies.  MEDICATIONS:  Current Outpatient Medications  Medication Sig Dispense Refill   amiodarone (PACERONE) 200 MG tablet Take 0.5 tablets (100 mg total) by mouth daily. 15 tablet 12   cycloSPORINE (RESTASIS) 0.05 % ophthalmic emulsion Place 1 drop into both eyes 2 (two) times daily.     diltiazem (CARDIZEM CD) 300 MG 24 hr capsule Take 1 capsule (300 mg total) by mouth daily. 90 capsule 3   fluticasone (FLONASE) 50 MCG/ACT nasal spray SHAKE LQ AND U 2 SPRAYS IEN D  11   gabapentin (NEURONTIN) 300 MG capsule TAKE 1 CAPSULE TWICE A DAY 180 capsule 4   metoprolol succinate (TOPROL-XL) 50 MG 24 hr tablet TAKE 1 TABLET DAILY WITH OR IMMEDIATELY FOLLOWING A MEAL 90 tablet 4   Multiple Vitamin (MULTIVITAMIN WITH MINERALS) TABS tablet Take 1 tablet by mouth every evening.      potassium chloride SA (K-DUR,KLOR-CON) 20 MEQ tablet Take 2 tablets (40 mEq total) by mouth daily. 180 tablet 3   rosuvastatin (CRESTOR) 5 MG tablet TAKE 1 TABLET DAILY 90 tablet 1   torsemide (DEMADEX) 20 MG tablet Take 2 tablets (40 mg total) by mouth daily. Take extra 20 mg tablet once daily as needed for 3 lbs or  more weight gain. 180 tablet 3   traZODone (DESYREL) 100 MG tablet Take 300 mg by mouth at bedtime.      warfarin (COUMADIN) 5 MG tablet TAKE 1 TABLET DAILY AS DIRECTED BY THE COUMADIN CLINIC 60 tablet 1   No current facility-administered medications for this encounter.     REVIEW OF SYSTEMS:  On review of systems, the patient reports that he is doing well overall. He denies any chest pain, shortness of breath, cough, fevers, chills, night sweats, unintended weight changes. He denies any bowel disturbances, and denies abdominal pain, nausea or vomiting. He reports constant bilateral knee pain. His IPSS was 12, indicating moderate urinary symptoms. He reports mild daytime urinary urgency and frequency, mild urinary leakage that doesn't require a pad, and nocturia x3. He notes the nocturia is likely secondary to his diuretic. His SHIM was 1, indicating he has severe erectile dysfunction, likely related to his prostatectomy. A complete review of systems is obtained and is otherwise negative.    PHYSICAL EXAM:  Wt Readings from Last 3 Encounters:  03/11/19 173 lb (78.5 kg)  01/17/19 174 lb 3.2 oz (79 kg)  11/01/18 170 lb (77.1 kg)   Temp Readings from Last 3 Encounters:  11/01/18 (!) 97.3 F (36.3 C) (Oral)  09/14/18 (!) 96.8 F (36 C) (Oral)  08/05/18 (!) 97.5 F (36.4 C) (Oral)   BP Readings from Last 3 Encounters:  03/06/19 113/64  01/17/19 (!) 114/58  11/01/18 102/60   Pulse Readings from Last 3 Encounters:  03/06/19 (!) 56  01/17/19 66  11/01/18 61   Pain Assessment Pain Score: 0-No pain(bilateral knee pain) Pain Frequency: Constant Pain Loc: Knee/10  Unable to assess due to telephone consult visit format.   KPS = 70  100 - Normal; no complaints; no  evidence of disease. 90   - Able to carry on normal activity; minor signs or symptoms of disease. 80   - Normal activity with effort; some signs or symptoms of disease. 36   - Cares for self; unable to carry on normal  activity or to do active work. 60   - Requires occasional assistance, but is able to care for most of his personal needs. 50   - Requires considerable assistance and frequent medical care. 37   - Disabled; requires special care and assistance. 75   - Severely disabled; hospital admission is indicated although death not imminent. 16   - Very sick; hospital admission necessary; active supportive treatment necessary. 10   - Moribund; fatal processes progressing rapidly. 0     - Dead  Karnofsky DA, Abelmann [place], Craver LS and Burchenal [facility] [phone]) The use of the nitrogen mustards in the palliative treatment of carcinoma: with particular reference to bronchogenic carcinoma Cancer 1 634-56  LABORATORY DATA:  Lab Results  Component Value Date   WBC 6.9 11/01/2018   HGB 12.9 (L) 11/01/2018   HCT 38.9 (L) 11/01/2018   MCV 89.5 11/01/2018   PLT 78.0 (L) 11/01/2018   Lab Results  Component Value Date   NA 141 01/17/2019   K 4.4 01/17/2019   CL 107 01/17/2019   CO2 24 01/17/2019   Lab Results  Component Value Date   ALT 18 11/01/2018   AST 16 11/01/2018   ALKPHOS 51 11/01/2018   BILITOT 0.7 11/01/2018     RADIOGRAPHY: Nm Bone Scan Whole Body  Result Date: 02/28/2019 CLINICAL DATA:  History of prostate carcinoma. Bilateral knee pain. No injury. No surgeries. PSA 1.44 on 01/31/2019. EXAM: NUCLEAR MEDICINE WHOLE BODY BONE SCAN TECHNIQUE: Whole body anterior and posterior images were obtained approximately 3 hours after intravenous injection of radiopharmaceutical. RADIOPHARMACEUTICALS:  20.9 mCi Technetium-45m MDP IV COMPARISON:  CT chest, abdomen and pelvis dated 02/09/2019. FINDINGS: There are no areas of abnormal radiotracer localization to suggest metastatic disease to bone. Polyarticular degenerative uptake is noted involving the sternoclavicular joints superior left hip, both wrists and both knees, right knee greater than left. Renal uptake is noted bilaterally. IMPRESSION: 1. No evidence of  metastatic disease to bone. Electronically Signed   By: Lajean Manes M.D.   On: 02/28/2019 16:46      IMPRESSION/PLAN: 1. 85 y.o. gentleman with biochemical recurrence of Gleason 3+4 adenocarcinoma of the prostate with current PSA of 1.44 in 01/2019 s/p RRP 10/1992. This visit was conducted via telephone to spare the patient unnecessary potential exposure in the healthcare setting during the current COVID-19 pandemic.   Today we reviewed the findings and workup thus far.  We discussed the natural history of prostate cancer.  We discussed radiation treatment directed to the prostatic fossa with regard to the logistics and delivery of external beam radiation treatment. We reviewed data from the [facility] trial regarding use of ADT in combination with salvage XRT to the prostate fossa and pelvic lymph nodes in the setting of biochemical recurrence.  This study shows that extending radiation therapy to the pelvic lymph nodes combined with adding short-term hormone therapy to standard treatment of the prostate surgical bed can extend the amount of time before disease progression but does not appear to demonstrate an overall survival benefit.  Therefore, it is felt that the toxicities, especially cardiac toxicity in this case, associated with ADT and the negative impact it has on quality of life for patient's with lower risk,  Gleason 6-7 disease several years out from surgery, may outweigh the overall benefits of the treatment. Following a lengthy discussion of this topic, the patient prefers to avoid ADT at this time and only consider the use of ADT should his PSA continue to rise despite radiation alone.   We discussed the need for additional imaging with Axumin PET scan to further evaluate for obvious sites of oligometastatic disease that could potentially be treated with a shorter course of focused, SBRT as opposed to traditional salvage pelvic radiation as he did voice concern for arranging transportation for  daily treatment over 7.5 weeks since he does not drive himself. He does have a caregiver that comes daily, Mon.-Fri.   At the end of the conversation the patient is interested in moving forward with Axumin PET scan prior to making a final decision. He understands that our final treatment recommendations could potentially change pending those results as to whether he would be a candidate for 3- 5 fractions of SBRT versus a more standard course of salvage radiotherapy to the prostate fossa and pelvic lymph nodes though we may consider hypofractionation in his case to reduce his length of treatment/exposure during the current COVID-19 pandemic.  We will make arrangements for the Axumin PET to be performed in the next 1-2 weeks and will follow up with the patient by telephone thereafter to review results and final recommendations.  We will share this discussion with Dr. Lovena Neighbours and keep him posted as we move forward.  Given current concerns for patient exposure during the COVID-19 pandemic, this encounter was conducted via telephone. The patient was notified in advance and was offered a [place] meeting to allow for face to face communication but unfortunately reported that he did not have the appropriate resources/technology to support such a visit and instead preferred to proceed with telephone consult. The patient has given verbal consent for this type of encounter. The time spent during this encounter was 60 minutes, with 50% of that time spent in reviewing outside information and coordinating his care. The attendants for this meeting include Tyler Pita MD, Ashlyn Bruning PA-C, [place], and patient, Ebony Rickel. During the encounter, Tyler Pita MD, Ashlyn Bruning PA-C, and scribe, Wilburn Mylar were located at [place].  Patient, Ishaq Maffei was located at home.   Nicholos Johns, PA-C    Tyler Pita, MD  [facility] Direct Dial: [phone]   Fax: [phone] [email or web address]   Skype   [email or web address]   This document serves as a record of services personally performed by Tyler Pita, MD and Freeman Caldron, PA-C. It was created on their behalf by Wilburn Mylar, a trained medical scribe. The creation of this record is based on the scribe's personal observations and the provider's statements to them. This document has been checked and approved by the attending provider.

## 2019-03-11 NOTE — Progress Notes (Signed)
GU Location of Tumor / Histology: biochemical recurrent prostatic adenocarcinoma s/p RRP 10/1992  If Prostate Cancer, Gleason Score is (3 + 4) and PSA was 6.4 at diagnosis  [name] [name] had an RRP 10/1992. Unfortunately, his PSA has been climbing indicative of biochemical reoccurrence.   PSA  02/02/2019 1.44    Surgical pathology:   Past/Anticipated interventions by urology, if any: RRP, referral for physical therapy, active surveillance, Myrbetriq (patient not taking), bone scan (negative), referral for consideration of radiotherapy  Past/Anticipated interventions by medical oncology, if any: no  Weight changes, if any: No. Watches weight closely. Increase in weight will cause him to have to take additional diuretic.  Bowel/Bladder complaints, if any: IPSS 12. SHIM 1. Reports mild daytime urinary urgency and frequency. Reports nocturia x 3 likely secondary to diuretic. Reports mild urinary leakage that doesn't require a pad. Denies dysuria or hematuria.   Nausea/Vomiting, if any: no  Pain issues, if any:  Constant bilateral knee pain.   SAFETY ISSUES:  Prior radiation? no  Pacemaker/ICD? no  Possible current pregnancy? no, male patient  Is the patient on methotrexate? no  Current Complaints / other details:  85 year old male. NKDA. Widowed. Two children and three grandchildren. Retired Optometrist. Has had melanoma removed in the past. Wife died in Mar 04, 2004.  Winter's note details how he hopes not to have to start ADT on this patient due to his multiple comorbidities.

## 2019-03-11 NOTE — Progress Notes (Signed)
See progress note under physician encounter. 

## 2019-03-15 ENCOUNTER — Other Ambulatory Visit (INDEPENDENT_AMBULATORY_CARE_PROVIDER_SITE_OTHER): Payer: Medicare Other

## 2019-03-15 ENCOUNTER — Institutional Professional Consult (permissible substitution): Payer: Medicare Other | Admitting: Radiation Oncology

## 2019-03-15 ENCOUNTER — Telehealth: Payer: Self-pay | Admitting: *Deleted

## 2019-03-15 ENCOUNTER — Inpatient Hospital Stay: Payer: Medicare Other

## 2019-03-15 ENCOUNTER — Inpatient Hospital Stay: Payer: Medicare Other | Attending: Hematology & Oncology | Admitting: Hematology & Oncology

## 2019-03-15 ENCOUNTER — Ambulatory Visit: Payer: Medicare Other

## 2019-03-15 ENCOUNTER — Other Ambulatory Visit: Payer: Self-pay

## 2019-03-15 VITALS — BP 107/66 | HR 56 | Temp 97.8°F | Resp 18 | Wt 173.0 lb

## 2019-03-15 DIAGNOSIS — Z7951 Long term (current) use of inhaled steroids: Secondary | ICD-10-CM

## 2019-03-15 DIAGNOSIS — D696 Thrombocytopenia, unspecified: Secondary | ICD-10-CM | POA: Insufficient documentation

## 2019-03-15 DIAGNOSIS — Z7901 Long term (current) use of anticoagulants: Secondary | ICD-10-CM | POA: Insufficient documentation

## 2019-03-15 DIAGNOSIS — K625 Hemorrhage of anus and rectum: Secondary | ICD-10-CM

## 2019-03-15 DIAGNOSIS — E785 Hyperlipidemia, unspecified: Secondary | ICD-10-CM | POA: Diagnosis not present

## 2019-03-15 DIAGNOSIS — I10 Essential (primary) hypertension: Secondary | ICD-10-CM | POA: Diagnosis not present

## 2019-03-15 DIAGNOSIS — Z79899 Other long term (current) drug therapy: Secondary | ICD-10-CM | POA: Diagnosis not present

## 2019-03-15 LAB — COMPREHENSIVE METABOLIC PANEL
ALT: 14 U/L (ref 0–53)
AST: 16 U/L (ref 0–37)
Albumin: 4 g/dL (ref 3.5–5.2)
Alkaline Phosphatase: 56 U/L (ref 39–117)
BUN: 31 mg/dL — ABNORMAL HIGH (ref 6–23)
CO2: 28 mEq/L (ref 19–32)
Calcium: 9.1 mg/dL (ref 8.4–10.5)
Chloride: 103 mEq/L (ref 96–112)
Creatinine, Ser: 1.46 mg/dL (ref 0.40–1.50)
GFR: 45.78 mL/min — ABNORMAL LOW (ref >60.00)
Glucose, Bld: 91 mg/dL (ref 70–99)
Potassium: 4.5 mEq/L (ref 3.5–5.1)
Sodium: 140 mEq/L (ref 135–145)
Total Bilirubin: 0.9 mg/dL (ref 0.2–1.2)
Total Protein: 7 g/dL (ref 6.0–8.3)

## 2019-03-15 LAB — LIPID PANEL
Cholesterol: 94 mg/dL (ref 0–200)
HDL: 38.9 mg/dL — ABNORMAL LOW (ref >39.00)
LDL Cholesterol: 41 mg/dL (ref 0–99)
NonHDL: 55.1
Total CHOL/HDL Ratio: 2
Triglycerides: 69 mg/dL (ref 0.0–149.0)
VLDL: 13.8 mg/dL (ref 0.0–40.0)

## 2019-03-15 LAB — CBC
HCT: 36.6 % — ABNORMAL LOW (ref 39.0–52.0)
Hemoglobin: 11.8 g/dL — ABNORMAL LOW (ref 13.0–17.0)
MCHC: 32.2 g/dL (ref 30.0–36.0)
MCV: 78.9 fl (ref 78.0–100.0)
Platelets: 59 10*3/uL — ABNORMAL LOW (ref 150.0–400.0)
RBC: 4.64 Mil/uL (ref 4.22–5.81)
RDW: 19.1 % — ABNORMAL HIGH (ref 11.5–15.5)
WBC: 6.4 10*3/uL (ref 4.0–10.5)

## 2019-03-15 LAB — CBC WITH DIFFERENTIAL (CANCER CENTER ONLY)
Abs Immature Granulocytes: 0.07 10*3/uL (ref 0.00–0.07)
Basophils Absolute: 0 10*3/uL (ref 0.0–0.1)
Basophils Relative: 0 %
Eosinophils Absolute: 0 10*3/uL (ref 0.0–0.5)
Eosinophils Relative: 0 %
HCT: 38.2 % — ABNORMAL LOW (ref 39.0–52.0)
Hemoglobin: 11.5 g/dL — ABNORMAL LOW (ref 13.0–17.0)
Immature Granulocytes: 1 %
Lymphocytes Relative: 21 %
Lymphs Abs: 1.5 10*3/uL (ref 0.7–4.0)
MCH: 25.2 pg — ABNORMAL LOW (ref 26.0–34.0)
MCHC: 30.1 g/dL (ref 30.0–36.0)
MCV: 83.6 fL (ref 80.0–100.0)
Monocytes Absolute: 1.3 10*3/uL — ABNORMAL HIGH (ref 0.1–1.0)
Monocytes Relative: 19 %
Neutro Abs: 4 10*3/uL (ref 1.7–7.7)
Neutrophils Relative %: 59 %
Platelet Count: 54 10*3/uL — ABNORMAL LOW (ref 150–400)
RBC: 4.57 MIL/uL (ref 4.22–5.81)
RDW: 18.2 % — ABNORMAL HIGH (ref 11.5–15.5)
WBC Count: 6.9 10*3/uL (ref 4.0–10.5)
nRBC: 0 % (ref 0.0–0.2)

## 2019-03-15 LAB — SAVE SMEAR (SSMR)

## 2019-03-15 LAB — TSH: TSH: 1.57 u[IU]/mL (ref 0.35–4.50)

## 2019-03-15 LAB — PLATELET BY CITRATE

## 2019-03-15 NOTE — Telephone Encounter (Signed)
CALLED PATIENT TO INFORM OF PET SCAN ON 03-29-19 - ARRIVAL TIME - 1:30 PM @ [facility], PT. TO BE NPO- 6HRS. PRIOR TO TEST, PATIENT SHOULD NOT EXERCISE 24- HRS. PRIOR TO TEST, LVM FOR A RETURN CALL

## 2019-03-15 NOTE — Progress Notes (Signed)
Hematology and Oncology Follow Up Visit  [name] [name] [id number] 03-26-1931 85 y.o. 03/15/2019   Principle Diagnosis:   Chronic thrombocytopenia-immune-based versus medication  Current Therapy:    Observation     Interim History:  [name] is back for follow-up.   It sounds like his prostate cancer is coming back.  He says that his PSA went up a little bit.  He had a bone scan done.  This did not show any bony metastasis.  He is avoiding the coronavirus.  He is staying at his assisted living.  He is eating okay.  He is having no bleeding.  He is having no rashes.  There is been no change in bowel or bladder habits.  He has had some constipation.  He has had no fever.  There is been no headache.  He has had no dysphasia.  Overall, his performance status is ECOG 2.    Medications:  Current Outpatient Medications:  .  amiodarone (PACERONE) 200 MG tablet, Take 0.5 tablets (100 mg total) by mouth daily., Disp: 15 tablet, Rfl: 12 .  cycloSPORINE (RESTASIS) 0.05 % ophthalmic emulsion, Place 1 drop into both eyes 2 (two) times daily., Disp: , Rfl:  .  diltiazem (CARDIZEM CD) 300 MG 24 hr capsule, Take 1 capsule (300 mg total) by mouth daily., Disp: 90 capsule, Rfl: 3 .  fluticasone (FLONASE) 50 MCG/ACT nasal spray, SHAKE LQ AND U 2 SPRAYS IEN D, Disp: , Rfl: 11 .  gabapentin (NEURONTIN) 300 MG capsule, TAKE 1 CAPSULE TWICE A DAY, Disp: 180 capsule, Rfl: 4 .  metoprolol succinate (TOPROL-XL) 50 MG 24 hr tablet, TAKE 1 TABLET DAILY WITH OR IMMEDIATELY FOLLOWING A MEAL, Disp: 90 tablet, Rfl: 4 .  Multiple Vitamin (MULTIVITAMIN WITH MINERALS) TABS tablet, Take 1 tablet by mouth every evening. , Disp: , Rfl:  .  potassium chloride SA (K-DUR,KLOR-CON) 20 MEQ tablet, Take 2 tablets (40 mEq total) by mouth daily., Disp: 180 tablet, Rfl: 3 .  rosuvastatin (CRESTOR) 5 MG tablet, TAKE 1 TABLET DAILY, Disp: 90 tablet, Rfl: 1 .  torsemide (DEMADEX) 20 MG tablet, Take 2 tablets (40 mg total) by  mouth daily. Take extra 20 mg tablet once daily as needed for 3 lbs or more weight gain., Disp: 180 tablet, Rfl: 3 .  traZODone (DESYREL) 100 MG tablet, Take 300 mg by mouth at bedtime. , Disp: , Rfl:  .  warfarin (COUMADIN) 5 MG tablet, TAKE 1 TABLET DAILY AS DIRECTED BY THE COUMADIN CLINIC, Disp: 60 tablet, Rfl: 1  Allergies: No Known Allergies  Past Medical History, Surgical history, Social history, and Family History were reviewed and updated.  Review of Systems: Review of Systems  Constitutional: Negative.   HENT:  Negative.   Eyes: Negative.   Respiratory: Negative.   Cardiovascular: Negative.   Gastrointestinal: Negative.   Endocrine: Negative.   Genitourinary: Negative.    Musculoskeletal: Positive for arthralgias.  Skin: Negative.   Neurological: Negative.   Hematological: Negative.   Psychiatric/Behavioral: Negative.     Physical Exam:  weight is 173 lb (78.5 kg). His oral temperature is 97.8 F (36.6 C). His blood pressure is 107/66 and his pulse is 56 (abnormal). His respiration is 18 and oxygen saturation is 98%.   Wt Readings from Last 3 Encounters:  03/15/19 173 lb (78.5 kg)  03/11/19 173 lb (78.5 kg)  01/17/19 174 lb 3.2 oz (79 kg)    Physical Exam Vitals signs reviewed.  HENT:     Head: Normocephalic  and atraumatic.  Eyes:     Pupils: Pupils are equal, round, and reactive to light.  Neck:     Musculoskeletal: Normal range of motion.  Cardiovascular:     Rate and Rhythm: Normal rate and regular rhythm.     Heart sounds: Normal heart sounds.  Pulmonary:     Effort: Pulmonary effort is normal.     Breath sounds: Normal breath sounds.  Abdominal:     General: Bowel sounds are normal.     Palpations: Abdomen is soft.  Musculoskeletal: Normal range of motion.        General: No tenderness or deformity.  Lymphadenopathy:     Cervical: No cervical adenopathy.  Skin:    General: Skin is warm and dry.     Findings: No erythema or rash.  Neurological:      Mental Status: He is alert and oriented to person, place, and time.  Psychiatric:        Behavior: Behavior normal.        Thought Content: Thought content normal.        Judgment: Judgment normal.      Lab Results  Component Value Date   WBC 6.9 03/15/2019   HGB 11.5 (L) 03/15/2019   HCT 38.2 (L) 03/15/2019   MCV 83.6 03/15/2019   PLT 54 (L) 03/15/2019     Chemistry      Component Value Date/Time   NA 141 01/17/2019 1226   NA 143 02/23/2017 1103   K 4.4 01/17/2019 1226   CL 107 01/17/2019 1226   CO2 24 01/17/2019 1226   BUN 29 (H) 01/17/2019 1226   BUN 25 02/23/2017 1103   CREATININE 1.57 (H) 01/17/2019 1226   CREATININE 1.06 08/25/2016 1339      Component Value Date/Time   CALCIUM 8.7 (L) 01/17/2019 1226   ALKPHOS 51 11/01/2018 1234   AST 16 11/01/2018 1234   ALT 18 11/01/2018 1234   BILITOT 0.7 11/01/2018 1234       Impression and Plan: Mr. [name] is a 85 year old white male.  He has mild thrombocytopenia.  His platelet count clearly is gone down.  I think that we may have to do something about this in the future.  I think a question now is whether or not this might be related to his prostate cancer.  I would not think this would be the case right now but it is always a possibility.  We will see him back in 3 months.  If his platelet count is still dropping, then I think we may have to consider a bone marrow biopsy on him.  I really would be adverse to doing this given his overall performance status but we may have to do 1 to figure out what might be going on.  He understands this.   Volanda Napoleon, MD 4/28/202012:26 PM

## 2019-03-16 ENCOUNTER — Telehealth: Payer: Self-pay | Admitting: *Deleted

## 2019-03-16 NOTE — Telephone Encounter (Signed)
Called patient to inform that would not be able to reschedule Pet ([facility]) until Nuc Med returns on Friday May 1, I told patient that I would call him on Friday May 1 with a new date and time, patient verified understanding this

## 2019-03-17 ENCOUNTER — Telehealth: Payer: Self-pay

## 2019-03-17 NOTE — Addendum Note (Signed)
Addended by: Magdalene Molly A on: 03/17/2019 11:39 AM   Modules accepted: Orders

## 2019-03-17 NOTE — Telephone Encounter (Signed)
Copied from [place] [id number]. Topic: General - Call Back - No Documentation >> Mar 17, 2019  1:29 PM Selinda Flavin B, [place] wrote: Reason for CRM: Patient calling and states that he received a phone call from the office, but could not understand the caller. Would like a call back to find out what the call was regarding. CB#: [phone]

## 2019-03-17 NOTE — Telephone Encounter (Signed)
I called him to go his lab results... I sent a message to the [facility] so they could handle it if he called back.

## 2019-03-18 ENCOUNTER — Telehealth: Payer: Self-pay | Admitting: *Deleted

## 2019-03-18 MED ORDER — FERROUS FUMARATE 325 (106 FE) MG PO TABS: 1.0000 | ORAL_TABLET | Freq: Every day | ORAL | 3 refills | Status: DC

## 2019-03-18 NOTE — Addendum Note (Signed)
Addended by: Magdalene Molly A on: 03/18/2019 03:20 PM   Modules accepted: Orders

## 2019-03-18 NOTE — Telephone Encounter (Signed)
Called patient to inform that the only time that [place] can get his medicine is 2 till 4 on any given day, spoke with patient and he stated that he needed to check if he can get another driver and he stated that he would call me back and let me know.

## 2019-03-21 ENCOUNTER — Telehealth: Payer: Self-pay

## 2019-03-21 NOTE — Telephone Encounter (Signed)
lmom for prescreen  

## 2019-03-21 NOTE — Telephone Encounter (Signed)

## 2019-03-22 ENCOUNTER — Telehealth: Payer: Self-pay | Admitting: *Deleted

## 2019-03-22 NOTE — Telephone Encounter (Signed)
CALLED PATIENT TO INFORM THAT ASHLYN BRUNING WILL HIM ON 03-30-19 @ 3:30 PM WITH RESULTS OF HIS PET SCAN, SPOKE WITH PATIENT AND HE VERIFIED UNDERSTANDING THIS

## 2019-03-23 ENCOUNTER — Ambulatory Visit (INDEPENDENT_AMBULATORY_CARE_PROVIDER_SITE_OTHER): Payer: Medicare Other

## 2019-03-23 ENCOUNTER — Other Ambulatory Visit: Payer: Self-pay

## 2019-03-23 DIAGNOSIS — Z5181 Encounter for therapeutic drug level monitoring: Secondary | ICD-10-CM | POA: Diagnosis not present

## 2019-03-23 DIAGNOSIS — I480 Paroxysmal atrial fibrillation: Secondary | ICD-10-CM

## 2019-03-23 DIAGNOSIS — I4891 Unspecified atrial fibrillation: Secondary | ICD-10-CM

## 2019-03-23 LAB — POCT INR: INR: 3.4 — AB (ref 2.0–3.0)

## 2019-03-23 NOTE — Patient Instructions (Signed)
Description   Called spoke with pt, advised to skip today's dosage of Warfarin, then start taking 1/2 tablet daily except 1 tablet on Saturdays.  Recheck in 4 weeks. Call [phone] if scheduled for any procedures or on any new medications

## 2019-03-29 ENCOUNTER — Encounter (HOSPITAL_COMMUNITY)
Admission: RE | Admit: 2019-03-29 | Discharge: 2019-03-29 | Disposition: A | Discharge status: Home / Self Care | Payer: Medicare Other | Source: Ambulatory Visit | Attending: Urology | Admitting: Urology

## 2019-03-29 ENCOUNTER — Telehealth: Payer: Self-pay | Admitting: Medical Oncology

## 2019-03-29 ENCOUNTER — Other Ambulatory Visit: Payer: Self-pay

## 2019-03-29 DIAGNOSIS — R0902 Hypoxemia: Secondary | ICD-10-CM | POA: Diagnosis not present

## 2019-03-29 DIAGNOSIS — C61 Malignant neoplasm of prostate: Secondary | ICD-10-CM | POA: Insufficient documentation

## 2019-03-29 DIAGNOSIS — J180 Bronchopneumonia, unspecified organism: Secondary | ICD-10-CM | POA: Diagnosis not present

## 2019-03-29 MED ORDER — AXUMIN (FLUCICLOVINE F 18) INJECTION
10.0700 | Freq: Once | INTRAVENOUS | Status: AC | PRN
  Administered 2019-03-29: 10.07 via INTRAVENOUS

## 2019-03-29 NOTE — Telephone Encounter (Signed)
Left message with Mr. Stacks to introduce myself as the prostate nurse navigator and my role. He consulted with Dr. Tammi Klippel 4/24 via telephone due to COVID-19 restrictions. He under went  prostatectomy 1993, now with biochemical recurrence. He is scheduled for staging  Axumin PET today. Pending results will decide radiation treatment. I asked him to call me with questions or concerns.

## 2019-03-30 ENCOUNTER — Ambulatory Visit
Admission: RE | Admit: 2019-03-30 | Discharge: 2019-03-30 | Disposition: A | Discharge status: Home / Self Care | Payer: Medicare Other | Source: Ambulatory Visit | Attending: Urology | Admitting: Urology

## 2019-03-30 ENCOUNTER — Ambulatory Visit: Admission: RE | Admit: 2019-03-30 | Payer: Medicare Other | Source: Ambulatory Visit | Admitting: Urology

## 2019-03-30 DIAGNOSIS — C61 Malignant neoplasm of prostate: Secondary | ICD-10-CM

## 2019-03-30 NOTE — Progress Notes (Signed)
Radiation Oncology         [phone] ________________________________  Follow Up Visit - Conducted via telephone due to current COVID-19 concerns for limiting patient exposure  Name: [name] [name] MRN: [id number]  Date: 03/30/2019  DOB: 03/17/1931  ZW:CHENI, Bonnita Levan, MD  Davis Gourd*   REFERRING PHYSICIAN: Davis Gourd*  DIAGNOSIS: 85 y.o. gentleman with biochemical recurrence of Gleason 3+4 adenocarcinoma of the prostate with current PSA of 1.44 in 01/2019 s/p RRP 10/1992.    ICD-10-CM   1. ADENOCARCINOMA, PROSTATE C61     HISTORY OF PRESENT ILLNESS: [name] [name] is a 85 y.o. male with a diagnosis of prostate cancer. He was initially diagnosed in 09/1992 with Gleason 3+4 disease and a PSA of 6.5 at the time of his biopsy. He opted to undergo a radical retropubic prostatectomy with BPLND on 10/31/1992 under the care and direction of Dr. Luanne Bras. His pathology was favorable with negative margins, no ECE, no SVI and no lymph nodes involved.  His postoperative PSA was undetectable and remained undetectable until around 2015 when it was noted at 0.23 during a routine follow up visit with Dr. Rana Snare. His PSA has been followed closely since that time, showing a very slow rise over time at 0.29 in January 2016, 0.34 in July 2017, 0.57 in July 2018 and 0.63 on 06/23/2018. However, unfortunately, his most recent PSA at the time of follow up visit with Dr. Lovena Neighbours on 01/31/2019 was 1.44, more than double in 6 months.  He proceeded with a chest/abdomen/pelvis CT scan on 02/09/2019 for disease restaging, with results showing no obvious concerning findings for metastatic disease.  There was a mildly enlarged right obturator node which appeared stable as compared with prior CT scan from 2015 and favored to be benign.  A Bone scan on 02/28/2019 was negative for evidence of osseous metastatic disease.  He had an Axumin PET 03/29/19 for further evaluation and this shows  evidence of local prostate cancer recurrence in the LEFT aspect of the prostatectomy bed positioned between the base the bladder and rectum but no convincing evidence of metastatic pelvic lymphadenopathy.   I spoke with the patient to review the PET scan results and further discuss treatment recommenations via telephone to spare the patient unnecessary potential exposure in the healthcare setting during the current COVID-19 pandemic.  The patient was notified in advance and gave permission to proceed with this visit format.  PREVIOUS RADIATION THERAPY: No  PAST MEDICAL HISTORY:  Past Medical History:  Diagnosis Date   Abnormality of gait 05/27/2016   Adenomatous polyps    Carpal tunnel syndrome 06/25/2016   Right   Depressive disorder, not elsewhere classified    First degree AV block    Holter 3/18: NSR, PACs, PVCs, no AFib, no pauses.   Hereditary and idiopathic peripheral neuropathy 06/25/2016   Hypertension    Internal nasal lesion 05/15/2013   Melanoma ([place])    Left Shoulder   Mitral regurgitation    MVP (mitral valve prolapse)    a. With severe MR s/p Complex valvuloplasty including artificial Gore-tex neochord placement x4, chordal transposition x1, chordal release x1, # 32 mm Sorin Memo 3D Ring Annuloplasty 2012. // b. Echo 2/18: mild LVH, EF 50-55, mild AI, MV repair with mild MR, mod LAE, mod RVE, severe RAE, severe TR   Neuropathy    Normal coronary arteries    a. Normal coronary anatomy by cath 2012.   Osteoarthritis    Knees  PAF (paroxysmal atrial fibrillation) ([place])    a. Post-op MVR 2012.   Personal history of colonic polyps    Prostate cancer ([place])    Pulmonary HTN ([place])    a. Mild-mod by cath 2012.   Pure hypercholesterolemia    PVC (premature ventricular contraction)    Thrombocytopenia ([facility])    Vision abnormalities    Cornea scarring      PAST SURGICAL HISTORY: Past Surgical History:  Procedure Laterality Date   CARDIAC  CATHETERIZATION  09/2011   Pre-op for MVR -- normal coronaries.   CARDIOVERSION N/A 01/02/2016   Procedure: CARDIOVERSION;  Surgeon: Thayer Headings, MD;  Location: [facility];  Service: Cardiovascular;  Laterality: N/A;   COLONOSCOPY W/ POLYPECTOMY     INGUINAL HERNIA REPAIR  09/2009   Left   KNEE ARTHROSCOPY      left x3  and right x2   Melanoma Surgery     2001, 2005, 2006, 2009   MITRAL VALVE REPAIR  10/01/2011   complex valvuloplasty with Goretex cord replacement and chordal transposition 32mm Sorin Memo 3D ring annuloplasty   Nuclear Stress Test  09/2006   EF-64%, Normal   PROSTATECTOMY  1993   RIGHT HEART CATH N/A 04/29/2017   Procedure: Right Heart Cath;  Surgeon: Jolaine Artist, MD;  Location: [facility];  Service: Cardiovascular;  Laterality: N/A;   ROOT CANAL  08-19-12   ROTATOR CUFF REPAIR  2003   left   TEE WITHOUT CARDIOVERSION  09/26/2011   Procedure: TRANSESOPHAGEAL ECHOCARDIOGRAM (TEE);  Surgeon: Lelon Perla, MD;  Location: [facility] ENDOSCOPY;  Service: Cardiovascular;  Laterality: N/A;   US ECHOCARDIOGRAPHY  09/2009, 08/1011   mild LVH,mild AI,MVP with mild MR, mild-mod. TR with mild Pulm. HTN, EF-55-60%    FAMILY HISTORY:  Family History  Problem Relation Age of Onset   Clotting disorder Brother        CVA's   Arthritis Mother    Hypertension Mother    Stroke Mother    Hypertension Father    Psychosis Father        psychiatric care   Colon cancer Neg Hx    Stomach cancer Neg Hx    Heart attack Neg Hx    Prostate cancer Neg Hx    Pancreatic cancer Neg Hx     SOCIAL HISTORY:  Social History   Socioeconomic History   Marital status: Widowed    Spouse name: Not on file   Number of children: 2   Years of education: 38   Highest education level: Not on file  Occupational History    Comment: retired  Scientist, product/process development strain: Not on file   Food insecurity:    Worry: Not on file     Inability: Not on file   Transportation needs:    Medical: Not on file    Non-medical: Not on file  Tobacco Use   Smoking status: Never Smoker   Smokeless tobacco: Never Used  Substance and Sexual Activity   Alcohol use: No    Alcohol/week: 0.0 standard drinks    Comment: Last drink in 2000   Drug use: No   Sexual activity: Not Currently  Lifestyle   Physical activity:    Days per week: Not on file    Minutes per session: Not on file   Stress: Not on file  Relationships   Social connections:    Talks on phone: Not on file    Gets together: Not  on file    Attends religious service: Not on file    Active member of club or organization: Not on file    Attends meetings of clubs or organizations: Not on file    Relationship status: Not on file   Intimate partner violence:    Fear of current or ex partner: Not on file    Emotionally abused: Not on file    Physically abused: Not on file    Forced sexual activity: Not on file  Other Topics Concern   Not on file  Social History Narrative   Retired - Optometrist   Widower   2 children (one in [place] and on one in [place])    Drinks 1 cup of coffee per day    ALLERGIES: Patient has no known allergies.  MEDICATIONS:  Current Outpatient Medications  Medication Sig Dispense Refill   amiodarone (PACERONE) 200 MG tablet Take 0.5 tablets (100 mg total) by mouth daily. 15 tablet 12   cycloSPORINE (RESTASIS) 0.05 % ophthalmic emulsion Place 1 drop into both eyes 2 (two) times daily.     diltiazem (CARDIZEM CD) 300 MG 24 hr capsule Take 1 capsule (300 mg total) by mouth daily. 90 capsule 3   ferrous fumarate (HEMOCYTE - 106 MG FE) 325 (106 Fe) MG TABS tablet Take 1 tablet (106 mg of iron total) by mouth daily. 30 each 3   fluticasone (FLONASE) 50 MCG/ACT nasal spray SHAKE LQ AND U 2 SPRAYS IEN D  11   gabapentin (NEURONTIN) 300 MG capsule TAKE 1 CAPSULE TWICE A DAY 180 capsule 4   metoprolol succinate (TOPROL-XL) 50  MG 24 hr tablet TAKE 1 TABLET DAILY WITH OR IMMEDIATELY FOLLOWING A MEAL 90 tablet 4   Multiple Vitamin (MULTIVITAMIN WITH MINERALS) TABS tablet Take 1 tablet by mouth every evening.      potassium chloride SA (K-DUR,KLOR-CON) 20 MEQ tablet Take 2 tablets (40 mEq total) by mouth daily. 180 tablet 3   rosuvastatin (CRESTOR) 5 MG tablet TAKE 1 TABLET DAILY 90 tablet 1   torsemide (DEMADEX) 20 MG tablet Take 2 tablets (40 mg total) by mouth daily. Take extra 20 mg tablet once daily as needed for 3 lbs or more weight gain. 180 tablet 3   traZODone (DESYREL) 100 MG tablet Take 300 mg by mouth at bedtime.      warfarin (COUMADIN) 5 MG tablet TAKE 1 TABLET DAILY AS DIRECTED BY THE COUMADIN CLINIC 60 tablet 1   No current facility-administered medications for this encounter.     REVIEW OF SYSTEMS:  On review of systems, the patient reports that he is doing well overall. He denies any chest pain, shortness of breath, cough, fevers, chills, night sweats, unintended weight changes. He denies any bowel disturbances, and denies abdominal pain, nausea or vomiting. He reports constant bilateral knee pain. His IPSS was 12, indicating moderate urinary symptoms. He reports mild daytime urinary urgency and frequency, mild urinary leakage that doesn't require a pad, and nocturia x3. He notes the nocturia is likely secondary to his diuretic. His SHIM was 1, indicating he has severe erectile dysfunction, likely related to his prostatectomy. A complete review of systems is obtained and is otherwise negative.    PHYSICAL EXAM:  Wt Readings from Last 3 Encounters:  03/15/19 173 lb (78.5 kg)  03/11/19 173 lb (78.5 kg)  01/17/19 174 lb 3.2 oz (79 kg)   Temp Readings from Last 3 Encounters:  03/15/19 97.8 F (36.6 C) (Oral)  11/01/18 Marland Kitchen)  97.3 F (36.3 C) (Oral)  09/14/18 (!) 96.8 F (36 C) (Oral)   BP Readings from Last 3 Encounters:  03/15/19 107/66  03/06/19 113/64  01/17/19 (!) 114/58   Pulse Readings  from Last 3 Encounters:  03/15/19 (!) 56  03/06/19 (!) 56  01/17/19 66   Unable to assess due to telephone consult visit format.  KPS = 70  100 - Normal; no complaints; no evidence of disease. 90   - Able to carry on normal activity; minor signs or symptoms of disease. 80   - Normal activity with effort; some signs or symptoms of disease. 76   - Cares for self; unable to carry on normal activity or to do active work. 60   - Requires occasional assistance, but is able to care for most of his personal needs. 50   - Requires considerable assistance and frequent medical care. 58   - Disabled; requires special care and assistance. 36   - Severely disabled; hospital admission is indicated although death not imminent. 11   - Very sick; hospital admission necessary; active supportive treatment necessary. 10   - Moribund; fatal processes progressing rapidly. 0     - Dead  Karnofsky DA, Abelmann Newell, Craver LS and Burchenal [facility] [phone]) The use of the nitrogen mustards in the palliative treatment of carcinoma: with particular reference to bronchogenic carcinoma Cancer 1 634-56  LABORATORY DATA:  Lab Results  Component Value Date   WBC 6.9 03/15/2019   HGB 11.5 (L) 03/15/2019   HCT 38.2 (L) 03/15/2019   MCV 83.6 03/15/2019   PLT 54 (L) 03/15/2019   Lab Results  Component Value Date   NA 140 03/15/2019   K 4.5 03/15/2019   CL 103 03/15/2019   CO2 28 03/15/2019   Lab Results  Component Value Date   ALT 14 03/15/2019   AST 16 03/15/2019   ALKPHOS 56 03/15/2019   BILITOT 0.9 03/15/2019     RADIOGRAPHY: Nm Pet (axumin) Skull Base To Mid Thigh  Result Date: 03/30/2019 CLINICAL DATA:  Prostate carcinoma status post radical prostatectomy. Elevated PSA. PSA equal 1.4 on 01/31/2019 EXAM: NUCLEAR MEDICINE PET SKULL BASE TO THIGH TECHNIQUE: 10.1 mCi F-18 Fluciclovine was injected intravenously. Full-ring PET imaging was performed from the skull base to thigh after the radiotracer. CT data was  obtained and used for attenuation correction and anatomic localization. COMPARISON:  02/09/2019 CT FINDINGS: NECK No radiotracer activity in neck lymph nodes. Incidental CT finding: None CHEST No radiotracer accumulation within mediastinal or hilar lymph nodes. No suspicious pulmonary nodules on the CT scan. Incidental CT finding: Round atelectasis LEFT lung base. Calcified nodules over the RIGHT hemidiaphragm. Findings not changed from recent CT. Heart enlarged particularly the LEFT atrium. ABDOMEN/PELVIS Prostate: Focal activity in the LEFT aspect of the prostatectomy bed with SUV max equal 6.7. There is soft tissue thickening positioned between the base of the bladder and the anterior wall of the rectum at this level measuring approximately 2.5 by 1.5 cm (image 196/4). Lymph nodes: No hypermetabolic common iliac, internal iliac or proximal external iliac lymph nodes are present. There is mild radiotracer activity associated with enlarged RIGHT external iliac lymph node measuring 8 mm (image 190/4). Activity is mild SUV max equal 3.3. There is a collection in the RIGHT groin adjacent to vascular clips measuring 2.2 cm (image 200/4) also with mild radiotracer activity (SUV max equal 3.4). Liver: No evidence of liver metastasis Incidental CT finding: None SKELETON No focal  activity to suggest  skeletal metastasis. IMPRESSION: 1. Evidence for local prostate cancer recurrence in the LEFT aspect of the prostatectomy bed position between the base the bladder and rectum. 2. No convincing evidence of metastatic pelvic lymphadenopathy. 3. Mild activity associated with a RIGHT external iliac lymph node and a ill-defined node or collection in the RIGHT groin are favored inflammatory rather than metastatic. 4. No evidence of distant metastatic prostate carcinoma. 5. No evidence skeletal metastasis. Electronically Signed   By: Suzy Bouchard M.D.   On: 03/30/2019 09:43      IMPRESSION/PLAN: 1. 85 y.o. gentleman with  biochemical recurrence of Gleason 3+4 adenocarcinoma of the prostate with current PSA of 1.44 in 01/2019 s/p RRP 10/1992. This visit was conducted via telephone to spare the patient unnecessary potential exposure in the healthcare setting during the current COVID-19 pandemic.   Today we reviewed the findings and workup thus far.  We discussed the results of his recent Axumin PET which showed evidence of local recurrence only, no nodal or skeletal involvement.  Therefore the recommendation is to proceed with radiation treatment directed to the prostatic fossa and pelvic lymph nodes.  We reviewed the logistics and delivery of external beam radiation treatment. We reviewed data from the [facility] trial regarding use of ADT in combination with salvage XRT to the prostate fossa and pelvic lymph nodes in the setting of biochemical recurrence.  This study shows that extending radiation therapy to the pelvic lymph nodes combined with adding short-term hormone therapy to standard treatment of the prostate surgical bed can extend the amount of time before disease progression but does not appear to demonstrate an overall survival benefit.  Therefore, it is felt that the toxicities, especially cardiac toxicity in this case, associated with ADT and the negative impact it has on quality of life for patient's with lower risk, Gleason 6-7 disease several years out from surgery, may outweigh the overall benefits of the treatment. Following a lengthy discussion of this topic, the patient prefers to avoid ADT, concurrent with radiation and only consider the use of ADT should his PSA continue to rise despite radiation alone or possibly consider LT-ADT alone.   We discussed the recommendation for a 4 week course of daily salvage pelvic radiation and he again, voiced concern for arranging transportation for daily treatments since he does not drive himself. He does have a caregiver that comes daily, Mon.-Fri.   At the end of the  conversation the patient would like some additional time to consider his options/treatment recommendations for salvage radiotherapy to the prostate fossa and pelvic lymph nodes versus ADT alone versus continued active surveillance. We discussed hypofractionation of treatment with a 4 week course of daily radiaton in his case, to reduce his length of treatment/exposure during the current COVID-19 pandemic given his advanced age and multiple medical comorbidities. He is planning to discuss this further with his children as well as reach out to Dr. Lovena Neighbours for further discussion of his options prior to making a final decision.  I will share this discussion with Dr. Lovena Neighbours and await a follow up call from the patient once he has made a decision.  Should he ultimately elect to proceed with radiation, we will move forward with treatment planning at that time.  Given current concerns for patient exposure during the COVID-19 pandemic, this encounter was conducted via telephone. The patient was notified in advance and was offered a [place] meeting to allow for face to face communication but unfortunately reported that he did not have the  appropriate resources/technology to support such a visit and instead preferred to proceed with telephone follow up visit. The patient has given verbal consent for this type of encounter. The time spent during this encounter was 30 minutes, with 50% of that time spent in reviewing outside information and coordinating his care. The attendants for this meeting include Darcelle Herrada PA-C, and patient, [name] [name]. During the encounter, Deland Slocumb PA-C, was located at [facility] Radiation Oncology Department.  Patient, [name] [name] was located at home.   Nicholos Johns, PA-C    Tyler Pita, MD  [facility] Direct Dial: [phone]   Fax: [phone] [email or web address]   Skype   LinkedIn

## 2019-04-01 ENCOUNTER — Emergency Department (HOSPITAL_BASED_OUTPATIENT_CLINIC_OR_DEPARTMENT_OTHER): Payer: Medicare Other

## 2019-04-01 ENCOUNTER — Encounter (HOSPITAL_BASED_OUTPATIENT_CLINIC_OR_DEPARTMENT_OTHER): Payer: Self-pay | Admitting: Adult Health

## 2019-04-01 ENCOUNTER — Other Ambulatory Visit: Payer: Self-pay

## 2019-04-01 ENCOUNTER — Inpatient Hospital Stay (HOSPITAL_BASED_OUTPATIENT_CLINIC_OR_DEPARTMENT_OTHER)
Admission: EM | Admit: 2019-04-01 | Discharge: 2019-04-07 | DRG: 193 | Disposition: A | Discharge status: Home Health | Payer: Medicare Other | Attending: Internal Medicine | Admitting: Internal Medicine

## 2019-04-01 DIAGNOSIS — G609 Hereditary and idiopathic neuropathy, unspecified: Secondary | ICD-10-CM | POA: Diagnosis present

## 2019-04-01 DIAGNOSIS — E860 Dehydration: Secondary | ICD-10-CM | POA: Diagnosis present

## 2019-04-01 DIAGNOSIS — Z8546 Personal history of malignant neoplasm of prostate: Secondary | ICD-10-CM

## 2019-04-01 DIAGNOSIS — H9190 Unspecified hearing loss, unspecified ear: Secondary | ICD-10-CM | POA: Diagnosis present

## 2019-04-01 DIAGNOSIS — E7800 Pure hypercholesterolemia, unspecified: Secondary | ICD-10-CM | POA: Diagnosis present

## 2019-04-01 DIAGNOSIS — D631 Anemia in chronic kidney disease: Secondary | ICD-10-CM | POA: Diagnosis present

## 2019-04-01 DIAGNOSIS — E441 Mild protein-calorie malnutrition: Secondary | ICD-10-CM | POA: Diagnosis present

## 2019-04-01 DIAGNOSIS — Z8261 Family history of arthritis: Secondary | ICD-10-CM

## 2019-04-01 DIAGNOSIS — Z79899 Other long term (current) drug therapy: Secondary | ICD-10-CM

## 2019-04-01 DIAGNOSIS — M170 Bilateral primary osteoarthritis of knee: Secondary | ICD-10-CM | POA: Diagnosis present

## 2019-04-01 DIAGNOSIS — I130 Hypertensive heart and chronic kidney disease with heart failure and stage 1 through stage 4 chronic kidney disease, or unspecified chronic kidney disease: Secondary | ICD-10-CM | POA: Diagnosis present

## 2019-04-01 DIAGNOSIS — I509 Heart failure, unspecified: Secondary | ICD-10-CM | POA: Diagnosis not present

## 2019-04-01 DIAGNOSIS — I361 Nonrheumatic tricuspid (valve) insufficiency: Secondary | ICD-10-CM | POA: Diagnosis not present

## 2019-04-01 DIAGNOSIS — I10 Essential (primary) hypertension: Secondary | ICD-10-CM | POA: Diagnosis present

## 2019-04-01 DIAGNOSIS — L89310 Pressure ulcer of right buttock, unstageable: Secondary | ICD-10-CM | POA: Diagnosis present

## 2019-04-01 DIAGNOSIS — I5033 Acute on chronic diastolic (congestive) heart failure: Secondary | ICD-10-CM | POA: Diagnosis present

## 2019-04-01 DIAGNOSIS — J180 Bronchopneumonia, unspecified organism: Secondary | ICD-10-CM | POA: Diagnosis present

## 2019-04-01 DIAGNOSIS — K219 Gastro-esophageal reflux disease without esophagitis: Secondary | ICD-10-CM | POA: Diagnosis present

## 2019-04-01 DIAGNOSIS — Z7901 Long term (current) use of anticoagulants: Secondary | ICD-10-CM | POA: Diagnosis not present

## 2019-04-01 DIAGNOSIS — Z85820 Personal history of malignant melanoma of skin: Secondary | ICD-10-CM

## 2019-04-01 DIAGNOSIS — J189 Pneumonia, unspecified organism: Secondary | ICD-10-CM | POA: Diagnosis not present

## 2019-04-01 DIAGNOSIS — Z952 Presence of prosthetic heart valve: Secondary | ICD-10-CM

## 2019-04-01 DIAGNOSIS — Z823 Family history of stroke: Secondary | ICD-10-CM

## 2019-04-01 DIAGNOSIS — R0902 Hypoxemia: Secondary | ICD-10-CM

## 2019-04-01 DIAGNOSIS — I959 Hypotension, unspecified: Secondary | ICD-10-CM | POA: Diagnosis present

## 2019-04-01 DIAGNOSIS — Z20828 Contact with and (suspected) exposure to other viral communicable diseases: Secondary | ICD-10-CM | POA: Diagnosis present

## 2019-04-01 DIAGNOSIS — L89320 Pressure ulcer of left buttock, unstageable: Secondary | ICD-10-CM | POA: Diagnosis present

## 2019-04-01 DIAGNOSIS — D696 Thrombocytopenia, unspecified: Secondary | ICD-10-CM | POA: Diagnosis present

## 2019-04-01 DIAGNOSIS — I480 Paroxysmal atrial fibrillation: Secondary | ICD-10-CM | POA: Diagnosis not present

## 2019-04-01 DIAGNOSIS — Z86010 Personal history of colonic polyps: Secondary | ICD-10-CM | POA: Diagnosis not present

## 2019-04-01 DIAGNOSIS — I4510 Unspecified right bundle-branch block: Secondary | ICD-10-CM | POA: Diagnosis present

## 2019-04-01 DIAGNOSIS — N183 Chronic kidney disease, stage 3 (moderate): Secondary | ICD-10-CM | POA: Diagnosis present

## 2019-04-01 DIAGNOSIS — Z8249 Family history of ischemic heart disease and other diseases of the circulatory system: Secondary | ICD-10-CM

## 2019-04-01 DIAGNOSIS — J9601 Acute respiratory failure with hypoxia: Secondary | ICD-10-CM | POA: Diagnosis present

## 2019-04-01 DIAGNOSIS — C61 Malignant neoplasm of prostate: Secondary | ICD-10-CM | POA: Diagnosis present

## 2019-04-01 DIAGNOSIS — N179 Acute kidney failure, unspecified: Secondary | ICD-10-CM | POA: Diagnosis present

## 2019-04-01 DIAGNOSIS — I5082 Biventricular heart failure: Secondary | ICD-10-CM | POA: Diagnosis present

## 2019-04-01 DIAGNOSIS — I440 Atrioventricular block, first degree: Secondary | ICD-10-CM | POA: Diagnosis present

## 2019-04-01 DIAGNOSIS — E861 Hypovolemia: Secondary | ICD-10-CM | POA: Diagnosis present

## 2019-04-01 DIAGNOSIS — I2729 Other secondary pulmonary hypertension: Secondary | ICD-10-CM | POA: Diagnosis present

## 2019-04-01 DIAGNOSIS — Z832 Family history of diseases of the blood and blood-forming organs and certain disorders involving the immune mechanism: Secondary | ICD-10-CM

## 2019-04-01 DIAGNOSIS — F329 Major depressive disorder, single episode, unspecified: Secondary | ICD-10-CM | POA: Diagnosis present

## 2019-04-01 DIAGNOSIS — L89300 Pressure ulcer of unspecified buttock, unstageable: Secondary | ICD-10-CM | POA: Diagnosis not present

## 2019-04-01 DIAGNOSIS — Z9079 Acquired absence of other genital organ(s): Secondary | ICD-10-CM

## 2019-04-01 DIAGNOSIS — I4891 Unspecified atrial fibrillation: Secondary | ICD-10-CM | POA: Diagnosis present

## 2019-04-01 DIAGNOSIS — Z951 Presence of aortocoronary bypass graft: Secondary | ICD-10-CM

## 2019-04-01 LAB — COMPREHENSIVE METABOLIC PANEL
ALT: 17 U/L (ref 0–44)
AST: 23 U/L (ref 15–41)
Albumin: 3.9 g/dL (ref 3.5–5.0)
Alkaline Phosphatase: 55 U/L (ref 38–126)
Anion gap: 12 (ref 5–15)
BUN: 34 mg/dL — ABNORMAL HIGH (ref 8–23)
CO2: 23 mmol/L (ref 22–32)
Calcium: 9 mg/dL (ref 8.9–10.3)
Chloride: 104 mmol/L (ref 98–111)
Creatinine, Ser: 1.75 mg/dL — ABNORMAL HIGH (ref 0.61–1.24)
GFR calc Af Amer: 40 mL/min — ABNORMAL LOW (ref >60)
GFR calc non Af Amer: 34 mL/min — ABNORMAL LOW (ref >60)
Glucose, Bld: 141 mg/dL — ABNORMAL HIGH (ref 70–99)
Potassium: 4 mmol/L (ref 3.5–5.1)
Sodium: 139 mmol/L (ref 135–145)
Total Bilirubin: 1.3 mg/dL — ABNORMAL HIGH (ref 0.3–1.2)
Total Protein: 7.2 g/dL (ref 6.5–8.1)

## 2019-04-01 LAB — CBC WITH DIFFERENTIAL/PLATELET
Abs Immature Granulocytes: 0.25 10*3/uL — ABNORMAL HIGH (ref 0.00–0.07)
Basophils Absolute: 0 10*3/uL (ref 0.0–0.1)
Basophils Relative: 0 %
Eosinophils Absolute: 0.1 10*3/uL (ref 0.0–0.5)
Eosinophils Relative: 0 %
HCT: 39.3 % (ref 39.0–52.0)
Hemoglobin: 11.6 g/dL — ABNORMAL LOW (ref 13.0–17.0)
Immature Granulocytes: 1 %
Lymphocytes Relative: 2 %
Lymphs Abs: 0.5 10*3/uL — ABNORMAL LOW (ref 0.7–4.0)
MCH: 25.3 pg — ABNORMAL LOW (ref 26.0–34.0)
MCHC: 29.5 g/dL — ABNORMAL LOW (ref 30.0–36.0)
MCV: 85.6 fL (ref 80.0–100.0)
Monocytes Absolute: 4.8 10*3/uL — ABNORMAL HIGH (ref 0.1–1.0)
Monocytes Relative: 21 %
Neutro Abs: 17.3 10*3/uL — ABNORMAL HIGH (ref 1.7–7.7)
Neutrophils Relative %: 76 %
Platelets: 60 10*3/uL — ABNORMAL LOW (ref 150–400)
RBC: 4.59 MIL/uL (ref 4.22–5.81)
RDW: 19.5 % — ABNORMAL HIGH (ref 11.5–15.5)
WBC: 23 10*3/uL — ABNORMAL HIGH (ref 4.0–10.5)
nRBC: 0 % (ref 0.0–0.2)

## 2019-04-01 LAB — PROTIME-INR
INR: 2.6 — ABNORMAL HIGH (ref 0.8–1.2)
Prothrombin Time: 27.6 seconds — ABNORMAL HIGH (ref 11.4–15.2)

## 2019-04-01 LAB — BASIC METABOLIC PANEL
Anion gap: 8 (ref 5–15)
BUN: 37 mg/dL — ABNORMAL HIGH (ref 8–23)
CO2: 26 mmol/L (ref 22–32)
Calcium: 8.9 mg/dL (ref 8.9–10.3)
Chloride: 107 mmol/L (ref 98–111)
Creatinine, Ser: 1.57 mg/dL — ABNORMAL HIGH (ref 0.61–1.24)
GFR calc Af Amer: 46 mL/min — ABNORMAL LOW (ref >60)
GFR calc non Af Amer: 39 mL/min — ABNORMAL LOW (ref >60)
Glucose, Bld: 121 mg/dL — ABNORMAL HIGH (ref 70–99)
Potassium: 4 mmol/L (ref 3.5–5.1)
Sodium: 141 mmol/L (ref 135–145)

## 2019-04-01 LAB — BRAIN NATRIURETIC PEPTIDE: B Natriuretic Peptide: 1036.9 pg/mL — ABNORMAL HIGH (ref 0.0–100.0)

## 2019-04-01 LAB — MRSA PCR SCREENING: MRSA by PCR: POSITIVE — AB

## 2019-04-01 LAB — STREP PNEUMONIAE URINARY ANTIGEN: Strep Pneumo Urinary Antigen: NEGATIVE

## 2019-04-01 LAB — SARS CORONAVIRUS 2 AG (30 MIN TAT): SARS Coronavirus 2 Ag: NEGATIVE

## 2019-04-01 LAB — D-DIMER, QUANTITATIVE: D-Dimer, Quant: 0.81 ug/mL-FEU — ABNORMAL HIGH (ref 0.00–0.50)

## 2019-04-01 MED ORDER — ACETAMINOPHEN 325 MG PO TABS: 650.0000 mg | ORAL_TABLET | Freq: Four times a day (QID) | ORAL | Status: DC | PRN

## 2019-04-01 MED ORDER — SODIUM CHLORIDE 0.9 % IV SOLN
500.0000 mg | INTRAVENOUS | Status: DC
  Administered 2019-04-02: 500 mg via INTRAVENOUS
  Filled 2019-04-01 (×2): qty 500

## 2019-04-01 MED ORDER — AMIODARONE HCL 100 MG PO TABS
100.0000 mg | ORAL_TABLET | Freq: Every day | ORAL | Status: DC
  Administered 2019-04-02 – 2019-04-07 (×6): 100 mg via ORAL
  Filled 2019-04-01 (×6): qty 1

## 2019-04-01 MED ORDER — METOPROLOL SUCCINATE ER 25 MG PO TB24
25.0000 mg | ORAL_TABLET | Freq: Every day | ORAL | Status: DC
  Filled 2019-04-01: qty 1

## 2019-04-01 MED ORDER — GABAPENTIN 300 MG PO CAPS
300.0000 mg | ORAL_CAPSULE | Freq: Two times a day (BID) | ORAL | Status: DC
  Administered 2019-04-01 – 2019-04-07 (×12): 300 mg via ORAL
  Filled 2019-04-01 (×12): qty 1

## 2019-04-01 MED ORDER — SODIUM CHLORIDE 0.9 % IV SOLN
INTRAVENOUS | Status: DC | PRN
  Administered 2019-04-01: 14:00:00 via INTRAVENOUS

## 2019-04-01 MED ORDER — CHLORHEXIDINE GLUCONATE CLOTH 2 % EX PADS
6.0000 | MEDICATED_PAD | Freq: Every day | CUTANEOUS | Status: AC
  Administered 2019-04-02 – 2019-04-06 (×4): 6 via TOPICAL

## 2019-04-01 MED ORDER — CYCLOSPORINE 0.05 % OP EMUL
1.0000 [drp] | Freq: Two times a day (BID) | OPHTHALMIC | Status: DC
  Administered 2019-04-01 – 2019-04-07 (×12): 1 [drp] via OPHTHALMIC
  Filled 2019-04-01 (×12): qty 30

## 2019-04-01 MED ORDER — SODIUM CHLORIDE 0.9 % IV SOLN
1.0000 g | INTRAVENOUS | Status: DC
  Administered 2019-04-02 – 2019-04-06 (×5): 1 g via INTRAVENOUS
  Filled 2019-04-01: qty 10
  Filled 2019-04-01 (×5): qty 1

## 2019-04-01 MED ORDER — SODIUM CHLORIDE 0.9 % IV BOLUS
500.0000 mL | Freq: Once | INTRAVENOUS | Status: AC
  Administered 2019-04-01: 500 mL via INTRAVENOUS

## 2019-04-01 MED ORDER — ACETAMINOPHEN 325 MG PO TABS
650.0000 mg | ORAL_TABLET | Freq: Once | ORAL | Status: AC
  Administered 2019-04-01: 650 mg via ORAL
  Filled 2019-04-01: qty 2

## 2019-04-01 MED ORDER — MUPIROCIN 2 % EX OINT
1.0000 "application " | TOPICAL_OINTMENT | Freq: Two times a day (BID) | CUTANEOUS | Status: AC
  Administered 2019-04-01 – 2019-04-06 (×10): 1 via NASAL
  Filled 2019-04-01 (×2): qty 22

## 2019-04-01 MED ORDER — AZITHROMYCIN 500 MG IV SOLR
INTRAVENOUS | Status: AC
  Filled 2019-04-01: qty 500

## 2019-04-01 MED ORDER — WARFARIN SODIUM 2.5 MG PO TABS
2.5000 mg | ORAL_TABLET | Freq: Once | ORAL | Status: AC
  Administered 2019-04-01: 2.5 mg via ORAL
  Filled 2019-04-01: qty 1

## 2019-04-01 MED ORDER — TRAZODONE HCL 100 MG PO TABS
300.0000 mg | ORAL_TABLET | Freq: Every day | ORAL | Status: DC
  Administered 2019-04-01 – 2019-04-06 (×6): 300 mg via ORAL
  Filled 2019-04-01: qty 6
  Filled 2019-04-01 (×2): qty 3
  Filled 2019-04-01: qty 6
  Filled 2019-04-01 (×2): qty 3

## 2019-04-01 MED ORDER — SODIUM CHLORIDE 0.9 % IV SOLN
1.0000 g | Freq: Once | INTRAVENOUS | Status: AC
  Administered 2019-04-01: 1 g via INTRAVENOUS
  Filled 2019-04-01: qty 10

## 2019-04-01 MED ORDER — ACETAMINOPHEN 650 MG RE SUPP: 650.0000 mg | Freq: Four times a day (QID) | RECTAL | Status: DC | PRN

## 2019-04-01 MED ORDER — ROSUVASTATIN CALCIUM 5 MG PO TABS
5.0000 mg | ORAL_TABLET | Freq: Every day | ORAL | Status: DC
  Administered 2019-04-02 – 2019-04-07 (×6): 5 mg via ORAL
  Filled 2019-04-01 (×6): qty 1

## 2019-04-01 MED ORDER — LACTATED RINGERS IV SOLN
INTRAVENOUS | Status: DC
  Administered 2019-04-01 – 2019-04-06 (×7): via INTRAVENOUS

## 2019-04-01 MED ORDER — SODIUM CHLORIDE 0.9 % IV SOLN
500.0000 mg | Freq: Once | INTRAVENOUS | Status: AC
  Administered 2019-04-01: 14:00:00 500 mg via INTRAVENOUS
  Filled 2019-04-01: qty 500

## 2019-04-01 MED ORDER — WARFARIN - PHARMACIST DOSING INPATIENT
Freq: Every day | Status: DC
  Administered 2019-04-01 – 2019-04-03 (×2)

## 2019-04-01 NOTE — Progress Notes (Signed)
85 year old gentleman with a history of A. fib on Coumadin and history of diastolic congestive heart failure presents to emergency department at [facility] with a complaint of shaking chills, subjective fever, generalized weakness and some shortness of breath, all of them started last night.  He was sent to the emergency department for further evaluation by his home health care nurse.  He was saturating around 80% on room air upon arrival.  Initially he was on 2 to 4 L on his regular which was then bumped up to nonrebreather.  He was tested negative for COVID.  Chest x-ray shows large right-sided bronchopneumonia.  He was diagnosed with pneumonia and given Rocephin and Zithromax and is being transferred to stepdown unit at [facility].

## 2019-04-01 NOTE — ED Triage Notes (Signed)
Presents from home, home health nurse brought him in with SOB that began last night, associated with chills. Crackles noted to right lower lung field. Sats 83%. Pt endorses cough.

## 2019-04-01 NOTE — ED Provider Notes (Signed)
[facility] EMERGENCY DEPARTMENT Provider Note   CSN: [id number] Arrival date & time: 04/01/19  1128    History   Chief Complaint Chief Complaint  Patient presents with  . Shortness of Breath    HPI [name] [name] is a 85 y.o. male presenting for evaluation of shortness of breath.  Patient states that he developed the shakes last night.  When he woke up this morning, he was weak to the point where he had difficulty walking.  Triage note states he had shortness of breath last night and today, the patient denies currently.  Additionally, he had checked in with chest pain, but patient also denies chest pain.  He does states he has been coughing, this began yesterday.  He was feeling well until last night.  He denies known fevers.  He denies nausea, vomiting, abdominal pain, urinary symptoms, abnormal bowel movements.  Patient states he has a history of CHF for which she has been taking his Lasix as prescribed.  He has a history of afib, on Coumadin.  He denies new medications.  He denies sick contacts.  He lives at home by himself, has a home health care aide Monday through Friday.  He denies tobacco, alcohol, or drug use.  He denies contact with known COVID-19 positive patient.  Additional history obtained from chart review.  Patient has a history of prostate cancer, which on recent studies appears to have returned, although he has not started radiation.  Additional history of A. fib on Coumadin, CHF, hypercholesterolemia, hypertension, GERD, hearing loss, MVP s/p repair.     HPI  Past Medical History:  Diagnosis Date  . Abnormality of gait 05/27/2016  . Adenomatous polyps   . Carpal tunnel syndrome 06/25/2016   Right  . Depressive disorder, not elsewhere classified   . First degree AV block    Holter 3/18: NSR, PACs, PVCs, no AFib, no pauses.  Marland Kitchen Hereditary and idiopathic peripheral neuropathy 06/25/2016  . Hypertension   . Internal nasal lesion 05/15/2013  . Melanoma ([place])     Left Shoulder  . Mitral regurgitation   . MVP (mitral valve prolapse)    a. With severe MR s/p Complex valvuloplasty including artificial Gore-tex neochord placement x4, chordal transposition x1, chordal release x1, # 32 mm Sorin Memo 3D Ring Annuloplasty 2012. // b. Echo 2/18: mild LVH, EF 50-55, mild AI, MV repair with mild MR, mod LAE, mod RVE, severe RAE, severe TR  . Neuropathy   . Normal coronary arteries    a. Normal coronary anatomy by cath 2012.  . Osteoarthritis    Knees  . PAF (paroxysmal atrial fibrillation) ([place])    a. Post-op MVR 2012.  Marland Kitchen Personal history of colonic polyps   . Prostate cancer ([place])   . Pulmonary HTN ([place])    a. Mild-mod by cath 2012.  . Pure hypercholesterolemia   . PVC (premature ventricular contraction)   . Thrombocytopenia ([place])   . Vision abnormalities    Cornea scarring    Patient Active Problem List   Diagnosis Date Noted  . CAP (community acquired pneumonia) 04/01/2019  . Knee pain, bilateral 08/08/2018  . Mass of right hand 04/12/2018  . Chronic renal insufficiency 10/06/2017  . Spondylosis without myelopathy or radiculopathy, lumbar region 09/16/2017  . Hypokalemia 04/30/2017  . Leg wound, left, sequela 04/30/2017  . Unstageable pressure ulcer of sacral region ([place]) 04/30/2017  . Fall   . Pressure injury of skin 04/29/2017  . Acute on chronic diastolic (congestive)  heart failure ([place])   . PAH (pulmonary artery hypertension) ([place])   . Right-sided heart failure ([place]) 04/26/2017  . Chronic diastolic heart failure ([place]) 04/21/2017  . Pulmonary hypertension, primary ([place]) 04/21/2017  . Severe tricuspid regurgitation 03/12/2017  . Bilateral lower extremity edema 02/25/2017  . Anticoagulated 02/25/2017  . Varicose veins of right lower extremity with complications 16/08/9603  . Obstructive lung disease (generalized) ([place]) 09/26/2016  . Carpal tunnel syndrome 06/25/2016  . Hereditary and idiopathic peripheral neuropathy 06/25/2016  .  Paresthesia 05/27/2016  . Abnormality of gait 05/27/2016  . Chest x-ray abnormality 01/09/2016  . Paroxysmal atrial flutter ([place]) 12/05/2015  . Constipation 06/13/2015  . Encounter for therapeutic drug monitoring 05/25/2014  . Syncope 04/03/2014  . Rhabdomyolysis 04/03/2014  . UTI (urinary tract infection) 04/03/2014  . Acute renal failure ([place]) 04/03/2014  . Leukocytosis 04/03/2014  . Internal nasal lesion 05/15/2013  . Low back pain 04/19/2013  . Melanoma ([place]) 09/30/2012  . Cough 03/26/2012  . Hx of mitral valve repair 11/19/2011  . Long term (current) use of anticoagulants 11/03/2011  . Decubitus skin ulcer 10/28/2011  . Pleural effusion due to congestive heart failure ([place]) 10/28/2011  . Atrial fibrillation ([place]) 10/07/2011  . S/P mitral valve repair 10/01/2011  . Valvular heart disease 08/21/2011  . Hearing loss 04/09/2011  . THROMBOCYTOPENIA 09/19/2010  . ADENOCARCINOMA, PROSTATE 09/17/2010  . CALLUS, LEFT FOOT 09/17/2010  . BACK PAIN, CHRONIC 09/17/2010  . TINEA PEDIS 05/28/2009  . DERMATITIS, ATOPIC 04/10/2009  . HYPERCHOLESTEROLEMIA 06/09/2008  . DEPRESSION 06/09/2008  . HYPERTENSION, BENIGN ESSENTIAL 06/09/2008  . GERD 06/09/2008  . OSTEOARTHRITIS, GENERALIZED, MULTIPLE JOINTS 06/09/2008  . MUSCLE SPASM, BACK 06/09/2008  . PERSONAL HISTORY MALIGNANT NEOPLASM PROSTATE 06/09/2008  . PERSONAL HISTORY OF MALIGNANT MELANOMA OF SKIN 06/09/2008  . ARRHYTHMIA, HX OF 06/09/2008  . Personal history of colonic polyps 06/09/2008    Past Surgical History:  Procedure Laterality Date  . CARDIAC CATHETERIZATION  09/2011   Pre-op for MVR -- normal coronaries.  Marland Kitchen CARDIOVERSION N/A 01/02/2016   Procedure: CARDIOVERSION;  Surgeon: Thayer Headings, MD;  Location: [facility]  ENDOSCOPY;  Service: Cardiovascular;  Laterality: N/A;  . COLONOSCOPY W/ POLYPECTOMY    . INGUINAL HERNIA REPAIR  09/2009   Left  . KNEE ARTHROSCOPY      left x3  and right x2  . Melanoma Surgery     2001, 2005,  2006, 2009  . MITRAL VALVE REPAIR  10/01/2011   complex valvuloplasty with Goretex cord replacement and chordal transposition 53mm Sorin Memo 3D ring annuloplasty  . Nuclear Stress Test  09/2006   EF-64%, Normal  . PROSTATECTOMY  1993  . RIGHT HEART CATH N/A 04/29/2017   Procedure: Right Heart Cath;  Surgeon: Jolaine Artist, MD;  Location: [facility] CV LAB;  Service: Cardiovascular;  Laterality: N/A;  . ROOT CANAL  08-19-12  . ROTATOR CUFF REPAIR  2003   left  . TEE WITHOUT CARDIOVERSION  09/26/2011   Procedure: TRANSESOPHAGEAL ECHOCARDIOGRAM (TEE);  Surgeon: Lelon Perla, MD;  Location: [facility] ENDOSCOPY;  Service: Cardiovascular;  Laterality: N/A;  . US ECHOCARDIOGRAPHY  09/2009, 08/1011   mild LVH,mild AI,MVP with mild MR, mild-mod. TR with mild Pulm. HTN, EF-55-60%        Home Medications    Prior to Admission medications   Medication Sig Start Date End Date Taking? Authorizing Provider  amiodarone (PACERONE) 200 MG tablet Take 0.5 tablets (100 mg total) by mouth daily. 08/11/18   Bensimhon, Shaune Pascal, MD  cycloSPORINE (RESTASIS) 0.05 % ophthalmic emulsion Place 1 drop into both eyes 2 (two) times daily.    [provider]  diltiazem (CARDIZEM CD) 300 MG 24 hr capsule Take 1 capsule (300 mg total) by mouth daily. 08/23/18   [name], MD  ferrous fumarate (HEMOCYTE - 106 MG FE) 325 (106 Fe) MG TABS tablet Take 1 tablet (106 mg of iron total) by mouth daily. 03/18/19   [name], MD  fluticasone (FLONASE) 50 MCG/ACT nasal spray SHAKE LQ AND U 2 SPRAYS IEN D 04/28/18   [provider]  gabapentin (NEURONTIN) 300 MG capsule TAKE 1 CAPSULE TWICE A DAY 12/08/18   Magnus Sinning, MD  metoprolol succinate (TOPROL-XL) 50 MG 24 hr tablet TAKE 1 TABLET DAILY WITH OR IMMEDIATELY FOLLOWING A MEAL 12/27/18   Larey Dresser, MD  Multiple Vitamin (MULTIVITAMIN WITH MINERALS) TABS tablet Take 1 tablet by mouth every evening.     [provider]   potassium chloride SA (K-DUR,KLOR-CON) 20 MEQ tablet Take 2 tablets (40 mEq total) by mouth daily. 05/08/17   Arbutus Leas, NP  rosuvastatin (CRESTOR) 5 MG tablet TAKE 1 TABLET DAILY 10/13/18   Larey Dresser, MD  torsemide (DEMADEX) 20 MG tablet Take 2 tablets (40 mg total) by mouth daily. Take extra 20 mg tablet once daily as needed for 3 lbs or more weight gain. 12/01/17   Clegg, Amy D, NP  traZODone (DESYREL) 100 MG tablet Take 300 mg by mouth at bedtime.  10/17/11   Nani Skillern, PA-C  warfarin (COUMADIN) 5 MG tablet TAKE 1 TABLET DAILY AS DIRECTED BY THE COUMADIN CLINIC 07/02/18   [name], MD  pantoprazole (PROTONIX) 40 MG tablet Take 1 tablet (40 mg total) by mouth daily before breakfast. 10/16/11 02/10/12  Nani Skillern, PA-C    Family History Family History  Problem Relation Age of Onset  . Clotting disorder Brother        CVA's  . Arthritis Mother   . Hypertension Mother   . Stroke Mother   . Hypertension Father   . Psychosis Father        psychiatric care  . Colon cancer Neg Hx   . Stomach cancer Neg Hx   . Heart attack Neg Hx   . Prostate cancer Neg Hx   . Pancreatic cancer Neg Hx     Social History Social History   Tobacco Use  . Smoking status: Never Smoker  . Smokeless tobacco: Never Used  Substance Use Topics  . Alcohol use: No    Alcohol/week: 0.0 standard drinks    Comment: Last drink in 2000  . Drug use: No     Allergies   Patient has no known allergies.   Review of Systems Review of Systems  Respiratory: Positive for cough and shortness of breath.   Neurological: Positive for weakness.  All other systems reviewed and are negative.    Physical Exam Updated Vital Signs BP (!) 87/57   Pulse 63   Temp 97.9 F (36.6 C) (Oral)   Resp 19   Ht 6\' 5"  (1.956 m)   Wt 78 kg   SpO2 97%   BMI 20.39 kg/m   Physical Exam Vitals signs and nursing note reviewed.  Constitutional:      General: He is not in acute  distress.    Appearance: He is well-developed.     Comments: Elderly, hard of hearing male  HENT:     Head:  Normocephalic and atraumatic.  Eyes:     Conjunctiva/sclera: Conjunctivae normal.     Pupils: Pupils are equal, round, and reactive to light.  Neck:     Musculoskeletal: Normal range of motion and neck supple.  Cardiovascular:     Rate and Rhythm: Normal rate and regular rhythm.     Pulses: Normal pulses.  Pulmonary:     Effort: Pulmonary effort is normal. No respiratory distress.     Breath sounds: Examination of the right-upper field reveals rhonchi. Examination of the right-middle field reveals rhonchi. Examination of the right-lower field reveals rhonchi. Rhonchi present. No wheezing.     Comments: No accessory muscle use or signs of respiratory failure.  Speaking in full sentences.  However, on room air sats at 80%.  Crackles noted in the R upper and lower lobe  Abdominal:     General: There is no distension.     Palpations: Abdomen is soft.     Tenderness: There is no abdominal tenderness.  Musculoskeletal: Normal range of motion.     Comments: Mild bilateral edema, right worse than left, pt states it's baseline. No ttp of lower leg. pusles intact x4  Skin:    General: Skin is warm and dry.     Capillary Refill: Capillary refill takes less than 2 seconds.  Neurological:     Mental Status: He is alert and oriented to person, place, and time.      ED Treatments / Results  Labs (all labs ordered are listed, but only abnormal results are displayed) Labs Reviewed  CBC WITH DIFFERENTIAL/PLATELET - Abnormal; Notable for the following components:      Result Value   WBC 23.0 (*)    Hemoglobin 11.6 (*)    MCH 25.3 (*)    MCHC 29.5 (*)    RDW 19.5 (*)    Platelets 60 (*)    Neutro Abs 17.3 (*)    Lymphs Abs 0.5 (*)    Monocytes Absolute 4.8 (*)    Abs Immature Granulocytes 0.25 (*)    All other components within normal limits  COMPREHENSIVE METABOLIC PANEL -  Abnormal; Notable for the following components:   Glucose, Bld 141 (*)    BUN 34 (*)    Creatinine, Ser 1.75 (*)    Total Bilirubin 1.3 (*)    GFR calc non Af Amer 34 (*)    GFR calc Af Amer 40 (*)    All other components within normal limits  D-DIMER, QUANTITATIVE (NOT AT [facility]) - Abnormal; Notable for the following components:   D-Dimer, Quant 0.81 (*)    All other components within normal limits  BRAIN NATRIURETIC PEPTIDE - Abnormal; Notable for the following components:   B Natriuretic Peptide 1,036.9 (*)    All other components within normal limits  PROTIME-INR - Abnormal; Notable for the following components:   Prothrombin Time 27.6 (*)    INR 2.6 (*)    All other components within normal limits  SARS CORONAVIRUS 2 (HOSP ORDER, PERFORMED IN [facility] LAB VIA ABBOTT ID)  CULTURE, BLOOD (ROUTINE X 2)  CULTURE, BLOOD (ROUTINE X 2)    EKG EKG Interpretation  Date/Time:  Friday Apr 01 2019 11:34:39 EDT Ventricular Rate:  102 PR Interval:    QRS Duration: 98 QT Interval:  390 QTC Calculation: 508 R Axis:   98 Text Interpretation:  Sinus rhythm with 1st degree A-V block Rightward axis Incomplete right bundle branch block Nonspecific ST and T wave abnormality Confirmed by Ashok Cordia,  Lennette Bihari [phone]) on 04/01/2019 11:42:59 AM   Radiology Dg Chest Portable 1 View  Result Date: 04/01/2019 CLINICAL DATA:  Shortness of breath over the last 2 days. EXAM: PORTABLE CHEST 1 VIEW COMPARISON:  03/12/2018 FINDINGS: Artifact overlies the chest. There is been previous median sternotomy and mitral valve replacement. There is chronic cardiomegaly and aortic atherosclerosis. The left lung shows some chronic scarring at the base but is otherwise clear. There is extensive infiltrate within the right lung consistent with bronchopneumonia. IMPRESSION: Extensive right bronchopneumonia. Electronically Signed   By: Nelson Chimes M.D.   On: 04/01/2019 13:20    Procedures .Critical Care Performed by:  Franchot Heidelberg, PA-C Authorized by: Franchot Heidelberg, PA-C   Critical care provider statement:    Critical care time (minutes):  35   Critical care time was exclusive of:  Separately billable procedures and treating other patients and teaching time   Critical care was necessary to treat or prevent imminent or life-threatening deterioration of the following conditions:  Respiratory failure   Critical care was time spent personally by me on the following activities:  Blood draw for specimens, development of treatment plan with patient or surrogate, evaluation of patient's response to treatment, examination of patient, obtaining history from patient or surrogate, ordering and performing treatments and interventions, ordering and review of laboratory studies, ordering and review of radiographic studies, pulse oximetry, review of old charts and re-evaluation of patient's condition   I assumed direction of critical care for this patient from another provider in my specialty: no   Comments:     Patient with extensive bronchopneumonia causing hypoxia requiring 15 L nonrebreather.  Concern for CHF exacerbation in the setting of hypotension, and possible need for pressors   (including critical care time)  Medications Ordered in ED Medications  azithromycin (ZITHROMAX) 500 MG injection (has no administration in time range)  0.9 %  sodium chloride infusion ( Intravenous Stopped 04/01/19 1518)  0.9 %  sodium chloride infusion ( Intravenous New Bag/Given 04/01/19 1414)  cefTRIAXone (ROCEPHIN) 1 g in sodium chloride 0.9 % 100 mL IVPB ( Intravenous Stopped 04/01/19 1439)  azithromycin (ZITHROMAX) 500 mg in sodium chloride 0.9 % 250 mL IVPB ( Intravenous Stopped 04/01/19 1515)  acetaminophen (TYLENOL) tablet 650 mg (650 mg Oral Given 04/01/19 1356)  sodium chloride 0.9 % bolus 500 mL ( Intravenous Stopped 04/01/19 1518)     Initial Impression / Assessment and Plan / ED Course  I have reviewed the triage  vital signs and the nursing notes.  Pertinent labs & imaging results that were available during my care of the patient were reviewed by me and considered in my medical decision making (see chart for details).        Patient presenting for evaluation of shortness of breath, cough, weakness.  Physical exam concerning, patient with low sats on room air in the 80s.  Initially placed on nasal cannula with little to no improvement.  Placed on 15 L nonrebreather, patient sats improved to 95 to 100%.  On exam, patient without signs of respiratory distress or failure, however crackles throughout the right lobe.  Concern for effusion versus pneumonia versus COVID versus PE.  Patient with rectal temp of 100.8.  Blood pressure soft, upper 41P systolic.  Will start work-up with labs, chest x-ray, EKG, and COVID test.  White count elevated at 23.  Of note, platelets low at 60.  Patient is dehydrated, creatinine 1.75 BUN 34.  However doubt rhabdo or acute renal failure at this  time.  BNP elevated over thousand.  As such, will be gentle with fluids, despite soft blood pressure.  Dimer elevated at 0.81, however when age-adjusted is normal.  PT/INR reassuring.  COVID test negative.  Chest x-ray shows a extensive bronchopneumonia on the right side.  Will draw blood cultures and start antibiotics.  Case discussed with attending, Dr. Ashok Cordia evaluated the patient.  Will call for admission.  On reassessment, patient remains on 15 L oxygen and appears comfortable with reassuring sats.  Blood pressure remains soft despite fluids.   Discussed with Dr. Doristine Bosworth from triad hospitalist service, patient to be admitted to the stepdown unit at [facility]   Final Clinical Impressions(s) / ED Diagnoses   Final diagnoses:  Community acquired pneumonia of right lung, unspecified part of lung  Acute congestive heart failure, unspecified heart failure type [facility])  Hypoxia    ED Discharge Orders    None       Franchot Heidelberg, PA-C 04/01/19 1551    Lajean Saver, MD 04/02/19 [phone]

## 2019-04-01 NOTE — Progress Notes (Signed)
ANTICOAGULATION CONSULT NOTE - Initial Consult  Pharmacy Consult for warfarin Indication: atrial fibrillation  No Known Allergies  Patient Measurements: Height: 6\' 5"  (195.6 cm) Weight: 171 lb 15.3 oz (78 kg) IBW/kg (Calculated) : 89.1  Vital Signs: Temp: 99.2 F (37.3 C) (05/15 1650) Temp Source: Rectal (05/15 1650) BP: 86/61 (05/15 1630) Pulse Rate: 52 (05/15 1700)  Labs: Recent Labs    04/01/19 1200  HGB 11.6*  HCT 39.3  PLT 60*  LABPROT 27.6*  INR 2.6*  CREATININE 1.75*    Estimated Creatinine Clearance: 33.4 mL/min (A) (by C-G formula based on SCr of 1.75 mg/dL (H)).   Medical History: Past Medical History:  Diagnosis Date  . Abnormality of gait 05/27/2016  . Adenomatous polyps   . Carpal tunnel syndrome 06/25/2016   Right  . Depressive disorder, not elsewhere classified   . First degree AV block    Holter 3/18: NSR, PACs, PVCs, no AFib, no pauses.  Marland Kitchen Hereditary and idiopathic peripheral neuropathy 06/25/2016  . Hypertension   . Internal nasal lesion 05/15/2013  . Melanoma ([place])    Left Shoulder  . Mitral regurgitation   . MVP (mitral valve prolapse)    a. With severe MR s/p Complex valvuloplasty including artificial Gore-tex neochord placement x4, chordal transposition x1, chordal release x1, # 32 mm Sorin Memo 3D Ring Annuloplasty 2012. // b. Echo 2/18: mild LVH, EF 50-55, mild AI, MV repair with mild MR, mod LAE, mod RVE, severe RAE, severe TR  . Neuropathy   . Normal coronary arteries    a. Normal coronary anatomy by cath 2012.  . Osteoarthritis    Knees  . PAF (paroxysmal atrial fibrillation) ([place])    a. Post-op MVR 2012.  Marland Kitchen Personal history of colonic polyps   . Prostate cancer ([place])   . Pulmonary HTN ([place])    a. Mild-mod by cath 2012.  . Pure hypercholesterolemia   . PVC (premature ventricular contraction)   . Thrombocytopenia ([place])   . Vision abnormalities    Cornea scarring    Medications:  Scheduled:  . amiodarone  100 mg Oral Daily  .  azithromycin      . cycloSPORINE  1 drop Both Eyes BID  . gabapentin  300 mg Oral BID  . [START ON 04/02/2019] metoprolol succinate  25 mg Oral Daily  . rosuvastatin  5 mg Oral Daily  . traZODone  300 mg Oral QHS   Infusions:  . sodium chloride Stopped (04/01/19 1518)  . sodium chloride Stopped (04/01/19 1636)  . [START ON 04/02/2019] azithromycin    . [START ON 04/02/2019] cefTRIAXone (ROCEPHIN)  IV    . lactated ringers      Assessment: 85 yo presented to ED with shortness of breath to start abx's for CAP and continue patient's warfarin PTA for hx Afib. Patient's home dose pre coumadin clinic notes is 2.5mg  daily except 5mg  on Saturdays. Last dose reportedly 5/14 per med rec. INR currently therapeutic and CBC good.    Goal of Therapy:  INR 2-3  Plan:  1) Continue with patient's home warfarin dose as above - 2.5mg  this PM 2) Daily INR   Adrian Saran, PharmD, BCPS Pager [phone] 04/01/2019 7:38 PM

## 2019-04-01 NOTE — ED Notes (Signed)
Provider aware of hypotension.

## 2019-04-01 NOTE — Progress Notes (Signed)
Paged Bodenheimer, NP for diet and admission orders.

## 2019-04-01 NOTE — ED Notes (Signed)
Report to [facility]. ETA 20 minutes 

## 2019-04-01 NOTE — H&P (Signed)
History and Physical    [name] [name] JEH:[id number] DOB: 12-15-1930 DOA: 04/01/2019  PCP: [name], MD  Patient coming from: home [facility]   Chief Complaint: cough  HPI: [name] [name] is a 85 y.o. 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  Was in his usual state of health yesterday. Overnight developed shortness of breath and productive cough. Felt chest was "rattling" a lot. Felt he was breathing more rapidly than normal. Also felt febrile. No abd or chest pain, no vomiting or diarrhea. No recent med changes. Sleeps flat, no change in baseline LE edema. No known sick contacts including covid contacts. Lives alone, has a home health aid, pt is her only patient. No recent hospitalizations or antibiotics. No known lung disease, not a smoker.   ED Course: At [facility] initially febrile to 100.8 and hypoxic to 80s. Placed on [place] o2 but sats low normal, improved to normal on nonrebreather. Received ceftriaxone/azythromycin and IV fluids. Labs and CXR performed. Patient feels a good bit better after these interventions.   Review of Systems: As per HPI otherwise 10 point review of systems negative.    Past Medical History:  Diagnosis Date  . Abnormality of gait 05/27/2016  . Adenomatous polyps   . Carpal tunnel syndrome 06/25/2016   Right  . Depressive disorder, not elsewhere classified   . First degree AV block    Holter 3/18: NSR, PACs, PVCs, no AFib, no pauses.  Marland Kitchen Hereditary and idiopathic peripheral neuropathy 06/25/2016  . Hypertension   . Internal nasal lesion 05/15/2013  . Melanoma ([place])    Left Shoulder  . Mitral regurgitation   . MVP (mitral valve prolapse)    a. With severe MR s/p Complex valvuloplasty including artificial  Gore-tex neochord placement x4, chordal transposition x1, chordal release x1, # 32 mm Sorin Memo 3D Ring Annuloplasty 2012. // b. Echo 2/18: mild LVH, EF 50-55, mild AI, MV repair with mild MR, mod LAE, mod RVE, severe RAE, severe TR  . Neuropathy   . Normal coronary arteries    a. Normal coronary anatomy by cath 2012.  . Osteoarthritis    Knees  . PAF (paroxysmal atrial fibrillation) ([place])    a. Post-op MVR 2012.  Marland Kitchen Personal history of colonic polyps   . Prostate cancer ([place])   . Pulmonary HTN ([place])    a. Mild-mod by cath 2012.  . Pure hypercholesterolemia   . PVC (premature ventricular contraction)   . Thrombocytopenia ([place])   . Vision abnormalities    Cornea scarring    Past Surgical History:  Procedure Laterality Date  . CARDIAC CATHETERIZATION  09/2011   Pre-op for MVR -- normal coronaries.  Marland Kitchen CARDIOVERSION N/A 01/02/2016   Procedure: CARDIOVERSION;  Surgeon: Thayer Headings, MD;  Location: [facility] ENDOSCOPY;  Service: Cardiovascular;  Laterality: N/A;  . COLONOSCOPY W/ POLYPECTOMY    . INGUINAL HERNIA REPAIR  09/2009   Left  . KNEE ARTHROSCOPY      left x3  and right x2  . Melanoma Surgery     2001, 2005, 2006, 2009  . MITRAL VALVE REPAIR  10/01/2011   complex valvuloplasty with Goretex cord replacement and chordal transposition 67mm Sorin Memo 3D ring annuloplasty  . Nuclear Stress Test  09/2006  EF-64%, Normal  . PROSTATECTOMY  1993  . RIGHT HEART CATH N/A 04/29/2017   Procedure: Right Heart Cath;  Surgeon: Jolaine Artist, MD;  Location: [facility] CV LAB;  Service: Cardiovascular;  Laterality: N/A;  . ROOT CANAL  08-19-12  . ROTATOR CUFF REPAIR  2003   left  . TEE WITHOUT CARDIOVERSION  09/26/2011   Procedure: TRANSESOPHAGEAL ECHOCARDIOGRAM (TEE);  Surgeon: Lelon Perla, MD;  Location: [facility] ENDOSCOPY;  Service: Cardiovascular;  Laterality: N/A;  . US ECHOCARDIOGRAPHY  09/2009, 08/1011   mild LVH,mild AI,MVP with mild MR, mild-mod. TR with mild Pulm. HTN,  EF-55-60%     reports that he has never smoked. He has never used smokeless tobacco. He reports that he does not drink alcohol or use drugs.  No Known Allergies  Family History  Problem Relation Age of Onset  . Clotting disorder Brother        CVA's  . Arthritis Mother   . Hypertension Mother   . Stroke Mother   . Hypertension Father   . Psychosis Father        psychiatric care  . Colon cancer Neg Hx   . Stomach cancer Neg Hx   . Heart attack Neg Hx   . Prostate cancer Neg Hx   . Pancreatic cancer Neg Hx     Prior to Admission medications   Medication Sig Start Date End Date Taking? Authorizing Provider  amiodarone (PACERONE) 200 MG tablet Take 0.5 tablets (100 mg total) by mouth daily. 08/11/18  Yes Bensimhon, Shaune Pascal, MD  diltiazem (CARDIZEM CD) 300 MG 24 hr capsule Take 1 capsule (300 mg total) by mouth daily. 08/23/18  Yes [name], MD  ferrous fumarate (HEMOCYTE - 106 MG FE) 325 (106 Fe) MG TABS tablet Take 1 tablet (106 mg of iron total) by mouth daily. 03/18/19  Yes [name], MD  gabapentin (NEURONTIN) 300 MG capsule TAKE 1 CAPSULE TWICE A DAY 12/08/18  Yes Magnus Sinning, MD  metoprolol succinate (TOPROL-XL) 50 MG 24 hr tablet TAKE 1 TABLET DAILY WITH OR IMMEDIATELY FOLLOWING A MEAL 12/27/18  Yes Larey Dresser, MD  Multiple Vitamin (MULTIVITAMIN WITH MINERALS) TABS tablet Take 1 tablet by mouth every evening.    Yes [provider]  potassium chloride SA (K-DUR,KLOR-CON) 20 MEQ tablet Take 2 tablets (40 mEq total) by mouth daily. 05/08/17  Yes Arbutus Leas, NP  rosuvastatin (CRESTOR) 5 MG tablet TAKE 1 TABLET DAILY 10/13/18  Yes Larey Dresser, MD  torsemide (DEMADEX) 20 MG tablet Take 2 tablets (40 mg total) by mouth daily. Take extra 20 mg tablet once daily as needed for 3 lbs or more weight gain. 12/01/17  Yes Clegg, Amy D, NP  traZODone (DESYREL) 100 MG tablet Take 300 mg by mouth at bedtime.  10/17/11  Yes Lars Pinks M, PA-C   warfarin (COUMADIN) 5 MG tablet TAKE 1 TABLET DAILY AS DIRECTED BY THE COUMADIN CLINIC 07/02/18  Yes [name], MD  cycloSPORINE (RESTASIS) 0.05 % ophthalmic emulsion Place 1 drop into both eyes 2 (two) times daily.    [provider]  fluticasone (FLONASE) 50 MCG/ACT nasal spray SHAKE LQ AND U 2 SPRAYS IEN D 04/28/18   [provider]  pantoprazole (PROTONIX) 40 MG tablet Take 1 tablet (40 mg total) by mouth daily before breakfast. 10/16/11 02/10/12  Nani Skillern, PA-C    Physical Exam: Vitals:   04/01/19 1534 04/01/19 1630 04/01/19 1650 04/01/19 1700  BP: Marland Kitchen)  87/57 (!) 86/61    Pulse: 63 75  (!) 52  Resp: 19 (!) 22  18  Temp:   99.2 F (37.3 C)   TempSrc:   Rectal   SpO2: 97% 95%  93%  Weight:      Height:        Constitutional: No acute distress Head: Atraumatic Eyes: Conjunctiva clear ENM: Drymucous membranes. Poordentition.  Neck: Supple Respiratory: rales at bases and on right side to mid-upper lung. Left upper lung clear. No tachypnea Cardiovascular: Regular rate and rhythm, borderline bradycardic. Moderate systolic murmur Abdomen: Non-tender, non-distended. No masses. No rebound or guarding. Positive bowel sounds. Musculoskeletal: No joint deformity upper and lower extremities. Normal ROM, no contractures. Normal muscle tone.  Skin: hyperpigmentation LEs Extremities: Mild LE edema to mid calf. Neurologic: Alert, moving all 4 extremities. Psychiatric: Normal insight and judgement.   Labs on Admission: I have personally reviewed following labs and imaging studies  CBC: Recent Labs  Lab 04/01/19 1200  WBC 23.0*  NEUTROABS 17.3*  HGB 11.6*  HCT 39.3  MCV 85.6  PLT 60*   Basic Metabolic Panel: Recent Labs  Lab 04/01/19 1200  NA 139  K 4.0  CL 104  CO2 23  GLUCOSE 141*  BUN 34*  CREATININE 1.75*  CALCIUM 9.0   GFR: Estimated Creatinine Clearance: 33.4 mL/min (A) (by C-G formula based on SCr of 1.75 mg/dL (H)). Liver  Function Tests: Recent Labs  Lab 04/01/19 1200  AST 23  ALT 17  ALKPHOS 55  BILITOT 1.3*  PROT 7.2  ALBUMIN 3.9   No results for input(s): LIPASE, AMYLASE in the last 168 hours. No results for input(s): AMMONIA in the last 168 hours. Coagulation Profile: Recent Labs  Lab 04/01/19 1200  INR 2.6*   Cardiac Enzymes: No results for input(s): CKTOTAL, CKMB, CKMBINDEX, TROPONINI in the last 168 hours. BNP (last 3 results) No results for input(s): PROBNP in the last 8760 hours. HbA1C: No results for input(s): HGBA1C in the last 72 hours. CBG: No results for input(s): GLUCAP in the last 168 hours. Lipid Profile: No results for input(s): CHOL, HDL, LDLCALC, TRIG, CHOLHDL, LDLDIRECT in the last 72 hours. Thyroid Function Tests: No results for input(s): TSH, T4TOTAL, FREET4, T3FREE, THYROIDAB in the last 72 hours. Anemia Panel: No results for input(s): VITAMINB12, FOLATE, FERRITIN, TIBC, IRON, RETICCTPCT in the last 72 hours. Urine analysis:    Component Value Date/Time   COLORURINE YELLOW 11/01/2018 [place] 11/01/2018 1234   LABSPEC 1.010 11/01/2018 1234   PHURINE 5.5 11/01/2018 1234   GLUCOSEU NEGATIVE 11/01/2018 1234   HGBUR NEGATIVE 11/01/2018 [place] 11/01/2018 1234   BILIRUBINUR negative 09/22/2017 [place] 11/01/2018 1234   PROTEINUR negative 09/22/2017 1040   PROTEINUR NEGATIVE 04/26/2017 2259   UROBILINOGEN 0.2 11/01/2018 1234   NITRITE NEGATIVE 11/01/2018 [place] 11/01/2018 1234    Radiological Exams on Admission: Dg Chest Portable 1 View  Result Date: 04/01/2019 CLINICAL DATA:  Shortness of breath over the last 2 days. EXAM: PORTABLE CHEST 1 VIEW COMPARISON:  03/12/2018 FINDINGS: Artifact overlies the chest. There is been previous median sternotomy and mitral valve replacement. There is chronic cardiomegaly and aortic atherosclerosis. The left lung shows some chronic scarring at the base  but is otherwise clear. There is extensive infiltrate within the right lung consistent with bronchopneumonia. IMPRESSION: Extensive right bronchopneumonia. Electronically Signed   By: Nelson Chimes M.D.   On: 04/01/2019 13:20  EKG: Independently reviewed. First degree av block, RA deviation, sinus rhythm, borderline brady  Assessment/Plan Principal Problem:   CAP (community acquired pneumonia) Active Problems:   ADENOCARCINOMA, PROSTATE   THROMBOCYTOPENIA   HYPERTENSION, BENIGN ESSENTIAL   PERSONAL HISTORY MALIGNANT NEOPLASM PROSTATE   Atrial fibrillation (HCC)   Long term (current) use of anticoagulants   Hypoxia   Community acquired pneumonia   # Community acquired pneumonia  # Sepsis- sig right sided bronchopneumonia noted on CXR. Hemodynamically stable though pressures soft (92/52 on arrival), febrile (now resolved), leukocytotic. Breathing comfortably on non-rebreather. No recent abx or hospitalizations. Covid rapid test negative and no known high risk exposures. bnp elevated if anything likely a bit hypovolemic and exam not consistent w/ significant chf exacerbation. - stepdown status for now - continue ceftriaxone and azithromycin - f/u blood cultures and urine legionella and streptococcal antigens - Continue O2, wean to [place] as able - gentle IV hydration LR @ 75, cautious given hx mixed chf, pre-load dependent  # acute kidney injury - mild, cr 1.75 from baseline of 1.3-1.4. 2/2 dehydration - gentle hydration as above, AM BMP  # HTN - here hypotensive - hold homd dilt and torsemide overnight, decrease metop to 25 qd, add back as able - cont home rosuvastatin  # paroxysmal a-fib - here sinus rhythm. inr therapeutic - cont amio - dilt/metop as above - warfarin per pharmacy.  # thrombocytopenia - moderate, stable, followed closely by heme, planning bmb if worsening - monitor  # prostate cancer - hx of, with recent local recurrence - followed by uro, in process of deciding  between treatment options   DVT prophylaxis: therapeutic on warfarin Code Status: full, confirmed w/ pt  Family Communication: daughter Anna Genre [phone]  Disposition Plan: tbd  Consults called: none  Admission status: step-down    Desma Maxim MD [facility] Pager [phone]  If 7PM-7AM, please contact night-coverage [email or web address] Password [facility]  04/01/2019, 7:25 PM

## 2019-04-02 DIAGNOSIS — Z7901 Long term (current) use of anticoagulants: Secondary | ICD-10-CM

## 2019-04-02 DIAGNOSIS — I10 Essential (primary) hypertension: Secondary | ICD-10-CM

## 2019-04-02 DIAGNOSIS — I480 Paroxysmal atrial fibrillation: Secondary | ICD-10-CM

## 2019-04-02 LAB — CBC
HCT: 35 % — ABNORMAL LOW (ref 39.0–52.0)
Hemoglobin: 10.5 g/dL — ABNORMAL LOW (ref 13.0–17.0)
MCH: 26.5 pg (ref 26.0–34.0)
MCHC: 30 g/dL (ref 30.0–36.0)
MCV: 88.4 fL (ref 80.0–100.0)
Platelets: 41 10*3/uL — ABNORMAL LOW (ref 150–400)
RBC: 3.96 MIL/uL — ABNORMAL LOW (ref 4.22–5.81)
RDW: 19.6 % — ABNORMAL HIGH (ref 11.5–15.5)
WBC: 18 10*3/uL — ABNORMAL HIGH (ref 4.0–10.5)
nRBC: 0 % (ref 0.0–0.2)

## 2019-04-02 LAB — PROTIME-INR
INR: 3.2 — ABNORMAL HIGH (ref 0.8–1.2)
Prothrombin Time: 32.4 seconds — ABNORMAL HIGH (ref 11.4–15.2)

## 2019-04-02 MED ORDER — ORAL CARE MOUTH RINSE
15.0000 mL | Freq: Two times a day (BID) | OROMUCOSAL | Status: DC
  Administered 2019-04-02 – 2019-04-07 (×11): 15 mL via OROMUCOSAL

## 2019-04-02 NOTE — Progress Notes (Addendum)
[place] for warfarin Indication: atrial fibrillation  No Known Allergies  Patient Measurements: Height: 6\' 5"  (195.6 cm) Weight: 171 lb 15.3 oz (78 kg) IBW/kg (Calculated) : 89.1  Vital Signs: Temp: 98.2 F (36.8 C) (05/16 0725) Temp Source: Oral (05/16 0725) BP: 99/54 (05/16 0700) Pulse Rate: 66 (05/16 0700)  Labs: Recent Labs    04/01/19 1200 04/01/19 1953 04/02/19 0309  HGB 11.6*  --  10.5*  HCT 39.3  --  35.0*  PLT 60*  --  41*  LABPROT 27.6*  --  32.4*  INR 2.6*  --  3.2*  CREATININE 1.75* 1.57*  --     Estimated Creatinine Clearance: 37.3 mL/min (A) (by C-G formula based on SCr of 1.57 mg/dL (H)).  Medications:  Scheduled:  . amiodarone  100 mg Oral Daily  . Chlorhexidine Gluconate Cloth  6 each Topical Q0600  . cycloSPORINE  1 drop Both Eyes BID  . gabapentin  300 mg Oral BID  . metoprolol succinate  25 mg Oral Daily  . mupirocin ointment  1 application Nasal BID  . rosuvastatin  5 mg Oral Daily  . traZODone  300 mg Oral QHS  . Warfarin - Pharmacist Dosing Inpatient   Does not apply q1800   Infusions:  . sodium chloride Stopped (04/01/19 1518)  . sodium chloride Stopped (04/01/19 1636)  . azithromycin    . cefTRIAXone (ROCEPHIN)  IV    . lactated ringers 75 mL/hr at 04/02/19 0700    Assessment: 85 yo presented with Hx PAF on warfarin, chronic thrombocytopenia, prostate CA, admitted 5/15 with CAP. Pharmacy to dose warfarin while admitted.    Baseline INR therapeutic  Prior anticoagulation: warfarin 2.5 mg daily except 5 mg on Saturdays; LD 5/14  Records show platelet baseline 50-100k plt as far back as 2012  Significant events:  Today, 04/02/2019:  CBC: Hgb slightly lower, Plt below baseline today  INR now SUPRAtherapeutic, likely d/t acute illness  Major drug interactions: started on broad-spectrum abx here; amiodarone continued from PTA  No bleeding issues per nursing  Eating 75% of  meals  Goal of Therapy: INR 2-3  Plan:  Hold warfarin today, favor resuming at empirically reduced dose d/t infection, concurrent abx (ie 2 mg daily)  Daily INR, will check CBC daily as well given thrombocytopenia  Monitor for signs of bleeding or thrombosis   Reuel Boom, PharmD, BCPS [phone] 04/02/2019, 9:51 AM

## 2019-04-02 NOTE — Progress Notes (Signed)
[facility]  PROGRESS NOTE  [name] [name] KTG:[id number] DOB: [birth year] DOA: 04/01/2019 PCP: [name], MD   Brief HPI:   85 year old male with a history of paroxysmal atrial fibrillation, mitral prolapse status post replacement, mixed diastolic and right-sided heart failure, CKD stage III, first-degree AV block, hypertension, thrombocytopenia, prostate cancer with recent diagnosis of local recurrence who came with cough.  Chest x-ray showedRight-sided bronchopneumonia.  Patient started on ceftriaxone and Zithromax.  SARS-CoV-2 negative   Subjective   Patient seen and examined, breathing somewhat better.  Denies coughing up any phlegm.  No chest pain.   Assessment/Plan:     1. Community-acquired pneumonia- Patient presented with right side bronchopneumonia on chest x-ray.  Started on ceftriaxone and Zithromax.  SARS-CoV-2 negative.Urinary strep pneumo antigen is negative.  Urine Legionella is pending.  WBC is down to 18,000.  2. Acute kidney injury on CKD stage III-patient presented with creatinine of 1.75, improved to 1.57 at his baseline.  Continue to monitor patient's BUN/creatinine in the hospital.  3. Hypertension-blood pressure is Soft, will hold metoprolol at this time.  4. Paroxysmal atrial fibrillation-heart rate is controlled, continue amiodarone, metoprolol.  Warfarin per pharmacy.  5. Thrombocytopenia-chronic, patient followed by oncology as outpatient.  6. Prostate cancer-history of recent local recurrence.  Followed by urology as outpatient.     CBC: Recent Labs  Lab 04/01/19 1200 04/02/19 0309  WBC 23.0* 18.0*  NEUTROABS 17.3*  --   HGB 11.6* 10.5*  HCT 39.3 35.0*  MCV 85.6 88.4  PLT 60* 41*    Basic Metabolic Panel: Recent Labs  Lab 04/01/19 1200 04/01/19 1953  NA 139 141  K 4.0 4.0  CL 104 107  CO2 23 26  GLUCOSE 141* 121*  BUN 34* 37*  CREATININE 1.75* 1.57*  CALCIUM 9.0 8.9     DVT prophylaxis: Warfarin  Code Status:  Full code  Family Communication: No family at bedside  Disposition Plan: likely home when medically ready for discharge     Consultants:    Procedures:     Antibiotics:   Anti-infectives (From admission, onward)   Start     Dose/Rate Route Frequency Ordered Stop   04/02/19 1500  azithromycin (ZITHROMAX) 500 mg in sodium chloride 0.9 % 250 mL IVPB     500 mg 250 mL/hr over 60 Minutes Intravenous Every 24 hours 04/01/19 1933     04/02/19 1400  cefTRIAXone (ROCEPHIN) 1 g in sodium chloride 0.9 % 100 mL IVPB     1 g 200 mL/hr over 30 Minutes Intravenous Every 24 hours 04/01/19 1933     04/01/19 1345  cefTRIAXone (ROCEPHIN) 1 g in sodium chloride 0.9 % 100 mL IVPB     1 g 200 mL/hr over 30 Minutes Intravenous  Once 04/01/19 1334 04/01/19 1439   04/01/19 1345  azithromycin (ZITHROMAX) 500 mg in sodium chloride 0.9 % 250 mL IVPB     500 mg 250 mL/hr over 60 Minutes Intravenous  Once 04/01/19 1334 04/01/19 1515   04/01/19 1338  azithromycin (ZITHROMAX) 500 MG injection    Note to Pharmacy:  Ilda Basset   : cabinet override      04/01/19 1338 04/02/19 0144       Objective   Vitals:   04/02/19 1000 04/02/19 1100 04/02/19 1110 04/02/19 1200  BP: (!) 110/59 (!) 100/51  (!) 86/42  Pulse: 67 66  64  Resp: 16 17  19   Temp:   98.5 F (36.9 C)   TempSrc:  Oral   SpO2: (!) 89% 94%  92%  Weight:      Height:        Intake/Output Summary (Last 24 hours) at 04/02/2019 1355 Last data filed at 04/02/2019 0700 Gross per 24 hour  Intake 2013.45 ml  Output 450 ml  Net 1563.45 ml   Filed Weights   04/01/19 1140  Weight: 78 kg     Physical Examination:  General-appears in no acute distress Heart-S1-S2, regular, no murmur auscultated Lungs-clear to auscultation bilaterally, no wheezing or crackles auscultated Abdomen-soft, nontender, no organomegaly Extremities-no edema in the lower extremities Neuro-alert, oriented x3, no focal deficit noted    Data Reviewed: I  have personally reviewed following labs and imaging studies   Recent Results (from the past 240 hour(s))  Blood culture (routine x 2)     Status: None (Preliminary result)   Collection Time: 04/01/19 12:00 PM  Result Value Ref Range Status   Specimen Description   Final    BLOOD RIGHT FOREARM Performed at [facility], [address], [place], [place] [address]    Special Requests   Final    BOTTLES DRAWN AEROBIC AND ANAEROBIC Blood Culture adequate volume Performed at [facility], [address] [place], [place] [address]    Culture   Final    NO GROWTH < 24 HOURS Performed at [facility], [place] [address], [place], [place] [address]    Report Status PENDING  Incomplete  SARS Coronavirus 2 (Hosp order,Performed in [facility] lab via [employer] ID)     Status: None   Collection Time: 04/01/19 12:20 PM  Result Value Ref Range Status   SARS Coronavirus 2 ([employer] ID Now) NEGATIVE NEGATIVE Final    Comment: (NOTE) Interpretive Result Comment(s): COVID 19 Positive SARS CoV 2 target nucleic acids are DETECTED. The SARS CoV 2 RNA is generally detectable in upper and lower respiratory specimens during the acute phase of infection.  Positive results are indicative of active infection with SARS CoV 2.  Clinical correlation with patient history and other diagnostic information is necessary to determine patient infection status.  Positive results do not rule out bacterial infection or coinfection with other viruses. The expected result is Negative. COVID 19 Negative SARS CoV 2 target nucleic acids are NOT DETECTED. The SARS CoV 2 RNA is generally detectable in upper and lower respiratory specimens during the acute phase of infection.  Negative results do not preclude SARS CoV 2 infection, do not rule out coinfections with other pathogens, and should not be used as the sole basis for treatment or other patient management decisions.  Negative results must be combined  with clinical  observations, patient history, and epidemiological information. The expected result is Negative. Invalid Presence or absence of SARS CoV 2 nucleic acids cannot be determined. Repeat testing was performed on the submitted specimen and repeated Invalid results were obtained.  If clinically indicated, additional testing on a new specimen with an alternate test methodology [phone]) is advised.  The SARS CoV 2 RNA is generally detectable in upper and lower respiratory specimens during the acute phase of infection. The expected result is Negative. Fact Sheet for Patients:  GolfingFamily.no Fact Sheet for Healthcare Providers: https://www.hernandez-brewer.com/ This test is not yet approved or cleared by the [place] FDA and has been authorized for detection and/or diagnosis of SARS CoV 2 by FDA under an Emergency Use Authorization (EUA).  This EUA will remain in effect (meaning this test can  be used) for the duration of the COVID19 d eclaration under Section 564(b)(1) of the Act, 21 U.S.C. section [phone] 3(b)(1), unless the authorization is terminated or revoked sooner. Performed at [facility], [address] [place], [place] [address]   Blood culture (routine x 2)     Status: None (Preliminary result)   Collection Time: 04/01/19  1:30 PM  Result Value Ref Range Status   Specimen Description   Final    BLOOD BLOOD RIGHT FOREARM Performed at [facility], [address] [place], [place] [address]    Special Requests   Final    BOTTLES DRAWN AEROBIC AND ANAEROBIC Blood Culture adequate volume Performed at [facility], [address] [place], [place] [address]    Culture   Final    NO GROWTH < 24 HOURS Performed at [facility], [place] [address] [place], [place] [address]    Report Status PENDING  Incomplete  MRSA PCR Screening     Status: Abnormal   Collection Time: 04/01/19  6:20 PM   Result Value Ref Range Status   MRSA by PCR POSITIVE (A) NEGATIVE Final    Comment:        The GeneXpert MRSA Assay (FDA approved for NASAL specimens only), is one component of a comprehensive MRSA colonization surveillance program. It is not intended to diagnose MRSA infection nor to guide or monitor treatment for MRSA infections. RESULT CALLED TO, READ BACK BY AND VERIFIED WITH: RUDD,B RN @2050  ON 04/01/2019 JACKSON,K Performed at [facility], [place] [address] [place], [place] [address]      Liver Function Tests: Recent Labs  Lab 04/01/19 1200  AST 23  ALT 17  ALKPHOS 55  BILITOT 1.3*  PROT 7.2  ALBUMIN 3.9   No results for input(s): LIPASE, AMYLASE in the last 168 hours. No results for input(s): AMMONIA in the last 168 hours.  Cardiac Enzymes: No results for input(s): CKTOTAL, CKMB, CKMBINDEX, TROPONINI in the last 168 hours. BNP (last 3 results) Recent Labs    04/01/19 1200  BNP 1,036.9*    ProBNP (last 3 results) No results for input(s): PROBNP in the last 8760 hours.    Studies: Dg Chest Portable 1 View  Result Date: 04/01/2019 CLINICAL DATA:  Shortness of breath over the last 2 days. EXAM: PORTABLE CHEST 1 VIEW COMPARISON:  03/12/2018 FINDINGS: Artifact overlies the chest. There is been previous median sternotomy and mitral valve replacement. There is chronic cardiomegaly and aortic atherosclerosis. The left lung shows some chronic scarring at the base but is otherwise clear. There is extensive infiltrate within the right lung consistent with bronchopneumonia. IMPRESSION: Extensive right bronchopneumonia. Electronically Signed   By: Nelson Chimes M.D.   On: 04/01/2019 13:20    Scheduled Meds: . amiodarone  100 mg Oral Daily  . Chlorhexidine Gluconate Cloth  6 each Topical Q0600  . cycloSPORINE  1 drop Both Eyes BID  . gabapentin  300 mg Oral BID  . mouth rinse  15 mL Mouth Rinse BID  . metoprolol succinate  25 mg Oral Daily  .  mupirocin ointment  1 application Nasal BID  . rosuvastatin  5 mg Oral Daily  . traZODone  300 mg Oral QHS  . Warfarin - Pharmacist Dosing Inpatient   Does not apply q1800    Admission status: Inpatient: Based on patients clinical presentation and evaluation of above clinical data, I have made determination that patient meets  Inpatient criteria at this time.  Time spent: 20 min  [facility] Pager [phone]. If 7PM-7AM, please contact night-coverage at [email or web address], Office  [phone]  password [email or web address]  04/02/2019, 1:55 PM  LOS: 1 day

## 2019-04-03 LAB — BASIC METABOLIC PANEL
Anion gap: 9 (ref 5–15)
BUN: 25 mg/dL — ABNORMAL HIGH (ref 8–23)
CO2: 26 mmol/L (ref 22–32)
Calcium: 9.1 mg/dL (ref 8.9–10.3)
Chloride: 109 mmol/L (ref 98–111)
Creatinine, Ser: 1.17 mg/dL (ref 0.61–1.24)
GFR calc Af Amer: >60 mL/min (ref >60)
GFR calc non Af Amer: 56 mL/min — ABNORMAL LOW (ref >60)
Glucose, Bld: 111 mg/dL — ABNORMAL HIGH (ref 70–99)
Potassium: 3.8 mmol/L (ref 3.5–5.1)
Sodium: 144 mmol/L (ref 135–145)

## 2019-04-03 LAB — CBC
HCT: 35 % — ABNORMAL LOW (ref 39.0–52.0)
Hemoglobin: 10.4 g/dL — ABNORMAL LOW (ref 13.0–17.0)
MCH: 26.2 pg (ref 26.0–34.0)
MCHC: 29.7 g/dL — ABNORMAL LOW (ref 30.0–36.0)
MCV: 88.2 fL (ref 80.0–100.0)
Platelets: 42 10*3/uL — ABNORMAL LOW (ref 150–400)
RBC: 3.97 MIL/uL — ABNORMAL LOW (ref 4.22–5.81)
RDW: 19.7 % — ABNORMAL HIGH (ref 11.5–15.5)
WBC: 11.2 10*3/uL — ABNORMAL HIGH (ref 4.0–10.5)
nRBC: 0 % (ref 0.0–0.2)

## 2019-04-03 LAB — PROTIME-INR
INR: 3.1 — ABNORMAL HIGH (ref 0.8–1.2)
Prothrombin Time: 31.5 seconds — ABNORMAL HIGH (ref 11.4–15.2)

## 2019-04-03 LAB — MAGNESIUM: Magnesium: 2.3 mg/dL (ref 1.7–2.4)

## 2019-04-03 MED ORDER — METOPROLOL SUCCINATE ER 25 MG PO TB24
25.0000 mg | ORAL_TABLET | Freq: Every day | ORAL | Status: DC
  Administered 2019-04-03 – 2019-04-07 (×5): 25 mg via ORAL
  Filled 2019-04-03 (×5): qty 1

## 2019-04-03 MED ORDER — AZITHROMYCIN 250 MG PO TABS
500.0000 mg | ORAL_TABLET | Freq: Every day | ORAL | Status: DC
  Administered 2019-04-03 – 2019-04-06 (×4): 500 mg via ORAL
  Filled 2019-04-03 (×4): qty 2

## 2019-04-03 NOTE — Evaluation (Signed)
Physical Therapy Evaluation Patient Details Name: [name] [name] MRN: [id number] DOB: 1931/07/11 Today's Date: 04/03/2019   History of Present Illness  85 year old male with a history of paroxysmal atrial fibrillation, mitral prolapse status post replacement, mixed diastolic and right-sided heart failure, CKD stage III, first-degree AV block, hypertension, thrombocytopenia, prostate cancer with recent diagnosis of local recurrence who came with cough.  Chest x-ray showedRight-sided bronchopneumonia  Clinical Impression  Pt admitted with above diagnosis. Pt currently with functional limitations due to the deficits listed below (see PT Problem List). Pt motivated to be OOB, limited by fatigue and dyspnea, should continue to progress well. Will follow in acute setting    Pt will benefit from skilled PT to increase their independence and safety with mobility to allow discharge to the venue listed below.       Follow Up Recommendations Home health PT;Supervision - Intermittent    Equipment Recommendations  None recommended by PT    Recommendations for Other Services       Precautions / Restrictions Precautions Precautions: Fall Precaution Comments: O2, monitor sats, attempting to wean, was not on O2 at home Restrictions Weight Bearing Restrictions: No      Mobility  Bed Mobility Overal bed mobility: Needs Assistance Bed Mobility: Supine to Sit     Supine to sit: Min guard     General bed mobility comments: for lines and safety  Transfers Overall transfer level: Needs assistance Equipment used: Rolling walker (2 wheeled) Transfers: Sit to/from [facility] Sit to Stand: Min assist;From elevated surface Stand pivot transfers: Min assist       General transfer comment: cues for hand placement, light assist to rise and transition to RW. SpO2= 92-94% on 5L [place]  Ambulation/Gait             General Gait Details: pivotal steps bed to chair only, fatigued  after transfer   Stairs            Wheelchair Mobility    Modified Rankin (Stroke Patients Only)       Balance Overall balance assessment: Needs assistance   Sitting balance-Leahy Scale: Good       Standing balance-Leahy Scale: Poor Standing balance comment: lightly reliant on UEs                             Pertinent Vitals/Pain Pain Assessment: No/denies pain    Home Living Family/patient expects to be discharged to:: Private residence Living Arrangements: Alone Available Help at Discharge: Personal care attendant;Available PRN/intermittently(6-7 d/wk, am and pm hours) Type of Home: House Home Access: Stairs to enter Entrance Stairs-Rails: None Entrance Stairs-Number of Steps: 2 Home Layout: (chair lift to get to second floor) Home Equipment: Walker - 2 wheels;Cane - quad      Prior Function Level of Independence: Needs assistance   Gait / Transfers Assistance Needed: amb with RW. does not use cane  ADL's / Homemaking Assistance Needed: aide assists with shower transfers        Hand Dominance        Extremity/Trunk Assessment   Upper Extremity Assessment Upper Extremity Assessment: Generalized weakness    Lower Extremity Assessment Lower Extremity Assessment: Generalized weakness       Communication   Communication: HOH  Cognition Arousal/Alertness: Awake/alert Behavior During Therapy: WFL for tasks assessed/performed Overall Cognitive Status: Within Functional Limits for tasks assessed  General Comments      Exercises     Assessment/Plan    PT Assessment Patient needs continued PT services  PT Problem List Decreased activity tolerance;Decreased balance;Decreased knowledge of use of DME;Decreased mobility;Cardiopulmonary status limiting activity       PT Treatment Interventions DME instruction;Gait training;Functional mobility training;Therapeutic  activities;Therapeutic exercise;Patient/family education    PT Goals (Current goals can be found in the Care Plan section)  Acute Rehab PT Goals Patient Stated Goal: go home, get stronger PT Goal Formulation: With patient Time For Goal Achievement: 04/17/19 Potential to Achieve Goals: Good    Frequency Min 3X/week   Barriers to discharge        Co-evaluation               AM-PAC PT "6 Clicks" Mobility  Outcome Measure Help needed turning from your back to your side while in a flat bed without using bedrails?: A Little Help needed moving from lying on your back to sitting on the side of a flat bed without using bedrails?: A Little Help needed moving to and from a bed to a chair (including a wheelchair)?: A Little Help needed standing up from a chair using your arms (e.g., wheelchair or bedside chair)?: A Little Help needed to walk in hospital room?: A Little Help needed climbing 3-5 steps with a railing? : A Lot 6 Click Score: 17    End of Session Equipment Utilized During Treatment: Gait belt Activity Tolerance: Patient limited by fatigue Patient left: in chair;with call bell/phone within reach;with chair alarm set;with nursing/sitter in room   PT Visit Diagnosis: Difficulty in walking, not elsewhere classified (R26.2);Muscle weakness (generalized) (M62.81)    Time: 8916-9450 PT Time Calculation (min) (ACUTE ONLY): 18 min   Charges:   PT Evaluation $PT Eval Low Complexity: 1 Low          Kenyon Ana, PT  Pager: [phone] Acute Rehab Dept [facility]): [phone]   04/03/2019   [facility] 04/03/2019, 2:43 PM

## 2019-04-03 NOTE — Progress Notes (Signed)
[place] for warfarin Indication: atrial fibrillation  No Known Allergies  Patient Measurements: Height: 6\' 5"  (195.6 cm) Weight: 171 lb 15.3 oz (78 kg) IBW/kg (Calculated) : 89.1  Vital Signs: Temp: 98.1 F (36.7 C) (05/17 0744) Temp Source: Oral (05/17 0744) BP: 129/74 (05/17 0800) Pulse Rate: 75 (05/17 0800)  Labs: Recent Labs    04/01/19 1200 04/01/19 1953 04/02/19 0309 04/03/19 0253  HGB 11.6*  --  10.5* 10.4*  HCT 39.3  --  35.0* 35.0*  PLT 60*  --  41* 42*  LABPROT 27.6*  --  32.4* 31.5*  INR 2.6*  --  3.2* 3.1*  CREATININE 1.75* 1.57*  --  1.17    Estimated Creatinine Clearance: 50 mL/min (by C-G formula based on SCr of 1.17 mg/dL).  Medications:  Scheduled:  . amiodarone  100 mg Oral Daily  . Chlorhexidine Gluconate Cloth  6 each Topical Q0600  . cycloSPORINE  1 drop Both Eyes BID  . gabapentin  300 mg Oral BID  . mouth rinse  15 mL Mouth Rinse BID  . mupirocin ointment  1 application Nasal BID  . rosuvastatin  5 mg Oral Daily  . traZODone  300 mg Oral QHS  . Warfarin - Pharmacist Dosing Inpatient   Does not apply q1800   Infusions:  . sodium chloride Stopped (04/01/19 1518)  . sodium chloride Stopped (04/01/19 1636)  . azithromycin Stopped (04/02/19 1627)  . cefTRIAXone (ROCEPHIN)  IV Stopped (04/02/19 1418)  . lactated ringers 75 mL/hr at 04/03/19 1000    Assessment: 85 yo presented with Hx PAF on warfarin, chronic thrombocytopenia, prostate CA, admitted 5/15 with CAP. Pharmacy to dose warfarin while admitted.    Baseline INR therapeutic  Prior anticoagulation: warfarin 2.5 mg daily except 5 mg on Saturdays; LD 5/14  Records show platelet baseline 50-100k plt as far back as 2012  Significant events:  Today, 04/03/2019:  CBC: Hgb, Plt stable from yesterday  INR remains SUPRAtherapeutic, likely d/t acute illness; improving  Major drug interactions: started on broad-spectrum abx here; amiodarone  continued from PTA  No bleeding issues per nursing  Eating 75% of meals  Goal of Therapy: INR 2-3  Plan:  Hold warfarin again today; INR only borderline high but trending down slowly do don't feel need to dose warfarin today  Consider resuming warfarin at lower dose (ie 2 mg) given acute illness and abx  May need to check with Onc before resuming warfarin if Plt remain < 50k  Daily INR, will check CBC daily as well given thrombocytopenia  Monitor for signs of bleeding or thrombosis   Reuel Boom, PharmD, BCPS [phone] 04/03/2019, 10:23 AM

## 2019-04-03 NOTE — Progress Notes (Signed)
Notified MD of 3 beat run of V tach in addition to frequent PVC's.  Orders received.

## 2019-04-03 NOTE — Progress Notes (Signed)
Pharmacy IV to PO conversion  This patient is receiving azithromycin by the intravenous route. Based on criteria approved by the Pharmacy and Therapeutics Committee, and the Infectious Disease Division, the antibiotic(s) is/are being converted to equivalent oral dose form(s). These criteria include:   Patient being treated for a respiratory tract infection, urinary tract infection, cellulitis, or Clostridium Difficile Associated Diarrhea  The patient is not neutropenic and does not exhibit a GI malabsorption state  The patient is eating (either orally or per tube) and/or has been taking other orally administered medications for at least 24 hours.  The patient is improving clinically (physician assessment and a 24-hour Tmax of <=100.5 F)  If you have any questions about this conversion, please contact the Pharmacy Department (ext [phone]).  Thank you.  Reuel Boom, PharmD, BCPS [phone] 04/03/2019, 10:31 AM

## 2019-04-03 NOTE — Plan of Care (Signed)
Able to wean patients oxygen down to 4 liters, maintains oxygen sats in the low to mid 90's.  Patient up to chair for several hours, up with walker and one standby assist.  No complaints of pain.

## 2019-04-03 NOTE — Progress Notes (Signed)
[facility]  PROGRESS NOTE  [name] [name] BHA:[id number] DOB: Apr 02, 1931 DOA: 04/01/2019 PCP: [name], MD   Brief HPI:   85 year old male with a history of paroxysmal atrial fibrillation, mitral prolapse status post replacement, mixed diastolic and right-sided heart failure, CKD stage III, first-degree AV block, hypertension, thrombocytopenia, prostate cancer with recent diagnosis of local recurrence who came with cough.  Chest x-ray showedRight-sided bronchopneumonia.  Patient started on ceftriaxone and Zithromax.  SARS-CoV-2 negative   Subjective   Patient seen and examined, still requiring oxygen.  Denies shortness of breath.  No chest pain.  WBC has come down to 11,000.  He has been afebrile.   Assessment/Plan:     1. Community-acquired pneumonia- Patient presented with right side bronchopneumonia on chest x-ray.  Started on ceftriaxone and Zithromax.  SARS-CoV-2 negative.Urinary strep pneumo antigen is negative.  Urine Legionella is pending.  WBC is down to 11,000.  2. Acute kidney injury on CKD stage III-patient presented with creatinine of 1.75, improved to 1.17 at his baseline.  Continue to monitor patient's BUN/creatinine in the hospital.  3. Hypertension-blood pressure is Soft, will hold metoprolol at this time.  4. Paroxysmal atrial fibrillation-heart rate is controlled, continue amiodarone.  Metoprolol is currently on hold.  Warfarin per pharmacy.  5. Thrombocytopenia-chronic, patient followed by oncology as outpatient.  6. Prostate cancer-history of recent local recurrence. Followed by urology as outpatient.     CBC: Recent Labs  Lab 04/01/19 1200 04/02/19 0309 04/03/19 0253  WBC 23.0* 18.0* 11.2*  NEUTROABS 17.3*  --   --   HGB 11.6* 10.5* 10.4*  HCT 39.3 35.0* 35.0*  MCV 85.6 88.4 88.2  PLT 60* 41* 42*    Basic Metabolic Panel: Recent Labs  Lab 04/01/19 1200 04/01/19 1953 04/03/19 0253  NA 139 141 144  K 4.0 4.0 3.8  CL 104 107 109   CO2 23 26 26   GLUCOSE 141* 121* 111*  BUN 34* 37* 25*  CREATININE 1.75* 1.57* 1.17  CALCIUM 9.0 8.9 9.1     DVT prophylaxis: Warfarin  Code Status: Full code  Family Communication: No family at bedside  Disposition Plan: likely home when medically ready for discharge     Consultants:    Procedures:     Antibiotics:   Anti-infectives (From admission, onward)   Start     Dose/Rate Route Frequency Ordered Stop   04/03/19 1400  azithromycin (ZITHROMAX) tablet 500 mg     500 mg Oral Daily 04/03/19 1032     04/02/19 1500  azithromycin (ZITHROMAX) 500 mg in sodium chloride 0.9 % 250 mL IVPB  Status:  Discontinued     500 mg 250 mL/hr over 60 Minutes Intravenous Every 24 hours 04/01/19 1933 04/03/19 1032   04/02/19 1400  cefTRIAXone (ROCEPHIN) 1 g in sodium chloride 0.9 % 100 mL IVPB     1 g 200 mL/hr over 30 Minutes Intravenous Every 24 hours 04/01/19 1933     04/01/19 1345  cefTRIAXone (ROCEPHIN) 1 g in sodium chloride 0.9 % 100 mL IVPB     1 g 200 mL/hr over 30 Minutes Intravenous  Once 04/01/19 1334 04/01/19 1439   04/01/19 1345  azithromycin (ZITHROMAX) 500 mg in sodium chloride 0.9 % 250 mL IVPB     500 mg 250 mL/hr over 60 Minutes Intravenous  Once 04/01/19 1334 04/01/19 1515   04/01/19 1338  azithromycin (ZITHROMAX) 500 MG injection    Note to Pharmacy:  Ilda Basset   : cabinet override  04/01/19 1338 04/02/19 0144       Objective   Vitals:   04/03/19 0744 04/03/19 0800 04/03/19 1032 04/03/19 1051  BP:  129/74  127/77  Pulse:  75  72  Resp:  16  20  Temp: 98.1 F (36.7 C)   98.5 F (36.9 C)  TempSrc: Oral   Oral  SpO2:  94%  92%  Weight:   80.8 kg   Height:   6\' 5"  (1.956 m)     Intake/Output Summary (Last 24 hours) at 04/03/2019 1207 Last data filed at 04/03/2019 1000 Gross per 24 hour  Intake 1977.33 ml  Output 2125 ml  Net -147.67 ml   Filed Weights   04/01/19 1140 04/03/19 1032  Weight: 78 kg 80.8 kg     Physical  Examination:  General-appears in no acute distress Heart-S1-S2, regular, no murmur auscultated Lungs-crackles noted throughout right lung field, no crackles in left lung field auscultated Abdomen-soft, nontender, no organomegaly Extremities-no edema in the lower extremities Neuro-alert, oriented x3, no focal deficit noted    Data Reviewed: I have personally reviewed following labs and imaging studies   Recent Results (from the past 240 hour(s))  Blood culture (routine x 2)     Status: None (Preliminary result)   Collection Time: 04/01/19 12:00 PM  Result Value Ref Range Status   Specimen Description   Final    BLOOD RIGHT FOREARM Performed at [facility], [address], [place], [place] [address]    Special Requests   Final    BOTTLES DRAWN AEROBIC AND ANAEROBIC Blood Culture adequate volume Performed at [facility], [address] [place], [place] [address]    Culture   Final    NO GROWTH < 24 HOURS Performed at [facility], [place] [address], [place], [place] [address]    Report Status PENDING  Incomplete  SARS Coronavirus 2 (Hosp order,Performed in [facility] lab via [employer] ID)     Status: None   Collection Time: 04/01/19 12:20 PM  Result Value Ref Range Status   SARS Coronavirus 2 ([employer] ID Now) NEGATIVE NEGATIVE Final    Comment: (NOTE) Interpretive Result Comment(s): COVID 19 Positive SARS CoV 2 target nucleic acids are DETECTED. The SARS CoV 2 RNA is generally detectable in upper and lower respiratory specimens during the acute phase of infection.  Positive results are indicative of active infection with SARS CoV 2.  Clinical correlation with patient history and other diagnostic information is necessary to determine patient infection status.  Positive results do not rule out bacterial infection or coinfection with other viruses. The expected result is Negative. COVID 19 Negative SARS CoV 2 target nucleic acids are NOT DETECTED. The  SARS CoV 2 RNA is generally detectable in upper and lower respiratory specimens during the acute phase of infection.  Negative results do not preclude SARS CoV 2 infection, do not rule out coinfections with other pathogens, and should not be used as the sole basis for treatment or other patient management decisions.  Negative results must be combined with clinical  observations, patient history, and epidemiological information. The expected result is Negative. Invalid Presence or absence of SARS CoV 2 nucleic acids cannot be determined. Repeat testing was performed on the submitted specimen and repeated Invalid results were obtained.  If clinically indicated, additional testing on a new specimen with an alternate test methodology [phone]) is advised.  The SARS CoV 2 RNA is generally detectable in upper and lower  respiratory specimens during the acute phase of infection. The expected result is Negative. Fact Sheet for Patients:  GolfingFamily.no Fact Sheet for Healthcare Providers: https://www.hernandez-brewer.com/ This test is not yet approved or cleared by the [place] FDA and has been authorized for detection and/or diagnosis of SARS CoV 2 by FDA under an Emergency Use Authorization (EUA).  This EUA will remain in effect (meaning this test can be used) for the duration of the COVID19 d eclaration under Section 564(b)(1) of the Act, 21 U.S.C. section [phone] 3(b)(1), unless the authorization is terminated or revoked sooner. Performed at [facility], [address] [place], [place] [address]   Blood culture (routine x 2)     Status: None (Preliminary result)   Collection Time: 04/01/19  1:30 PM  Result Value Ref Range Status   Specimen Description   Final    BLOOD BLOOD RIGHT FOREARM Performed at [facility], [address] [place], [place] [address]    Special Requests   Final    BOTTLES DRAWN AEROBIC AND  ANAEROBIC Blood Culture adequate volume Performed at [facility], [address] [place], [place] [address]    Culture   Final    NO GROWTH < 24 HOURS Performed at [facility], [place] [address] [place], [place] [address]    Report Status PENDING  Incomplete  MRSA PCR Screening     Status: Abnormal   Collection Time: 04/01/19  6:20 PM  Result Value Ref Range Status   MRSA by PCR POSITIVE (A) NEGATIVE Final    Comment:        The GeneXpert MRSA Assay (FDA approved for NASAL specimens only), is one component of a comprehensive MRSA colonization surveillance program. It is not intended to diagnose MRSA infection nor to guide or monitor treatment for MRSA infections. RESULT CALLED TO, READ BACK BY AND VERIFIED WITH: RUDD,B RN @2050  ON 04/01/2019 JACKSON,K Performed at [facility], [place] [address], [place], [place] [address]      Liver Function Tests: Recent Labs  Lab 04/01/19 1200  AST 23  ALT 17  ALKPHOS 55  BILITOT 1.3*  PROT 7.2  ALBUMIN 3.9   No results for input(s): LIPASE, AMYLASE in the last 168 hours. No results for input(s): AMMONIA in the last 168 hours.  Cardiac Enzymes: No results for input(s): CKTOTAL, CKMB, CKMBINDEX, TROPONINI in the last 168 hours. BNP (last 3 results) Recent Labs    04/01/19 1200  BNP 1,036.9*    ProBNP (last 3 results) No results for input(s): PROBNP in the last 8760 hours.    Studies: Dg Chest Portable 1 View  Result Date: 04/01/2019 CLINICAL DATA:  Shortness of breath over the last 2 days. EXAM: PORTABLE CHEST 1 VIEW COMPARISON:  03/12/2018 FINDINGS: Artifact overlies the chest. There is been previous median sternotomy and mitral valve replacement. There is chronic cardiomegaly and aortic atherosclerosis. The left lung shows some chronic scarring at the base but is otherwise clear. There is extensive infiltrate within the right lung consistent with bronchopneumonia. IMPRESSION: Extensive  right bronchopneumonia. Electronically Signed   By: Nelson Chimes M.D.   On: 04/01/2019 13:20    Scheduled Meds: . amiodarone  100 mg Oral Daily  . azithromycin  500 mg Oral Daily  . Chlorhexidine Gluconate Cloth  6 each Topical Q0600  . cycloSPORINE  1 drop Both Eyes BID  . gabapentin  300 mg Oral BID  . mouth rinse  15 mL Mouth Rinse BID  . mupirocin ointment  1 application Nasal BID  . rosuvastatin  5 mg Oral Daily  . traZODone  300 mg Oral QHS  . Warfarin - Pharmacist Dosing Inpatient   Does not apply q1800    Admission status: Inpatient: Based on patients clinical presentation and evaluation of above clinical data, I have made determination that patient meets Inpatient criteria at this time.  Time spent: 20 min  [facility] Pager [phone]. If 7PM-7AM, please contact night-coverage at [email or web address], Office  [phone]  password [email or web address]  04/03/2019, 12:07 PM  LOS: 2 days

## 2019-04-04 DIAGNOSIS — I509 Heart failure, unspecified: Secondary | ICD-10-CM

## 2019-04-04 DIAGNOSIS — C61 Malignant neoplasm of prostate: Secondary | ICD-10-CM

## 2019-04-04 DIAGNOSIS — D696 Thrombocytopenia, unspecified: Secondary | ICD-10-CM

## 2019-04-04 LAB — BASIC METABOLIC PANEL
Anion gap: 5 (ref 5–15)
BUN: 25 mg/dL — ABNORMAL HIGH (ref 8–23)
CO2: 25 mmol/L (ref 22–32)
Calcium: 8.9 mg/dL (ref 8.9–10.3)
Chloride: 111 mmol/L (ref 98–111)
Creatinine, Ser: 1.02 mg/dL (ref 0.61–1.24)
GFR calc Af Amer: >60 mL/min (ref >60)
GFR calc non Af Amer: >60 mL/min (ref >60)
Glucose, Bld: 97 mg/dL (ref 70–99)
Potassium: 4.3 mmol/L (ref 3.5–5.1)
Sodium: 141 mmol/L (ref 135–145)

## 2019-04-04 LAB — CBC
HCT: 35.2 % — ABNORMAL LOW (ref 39.0–52.0)
Hemoglobin: 10.2 g/dL — ABNORMAL LOW (ref 13.0–17.0)
MCH: 25.4 pg — ABNORMAL LOW (ref 26.0–34.0)
MCHC: 29 g/dL — ABNORMAL LOW (ref 30.0–36.0)
MCV: 87.8 fL (ref 80.0–100.0)
Platelets: 50 10*3/uL — ABNORMAL LOW (ref 150–400)
RBC: 4.01 MIL/uL — ABNORMAL LOW (ref 4.22–5.81)
RDW: 19.2 % — ABNORMAL HIGH (ref 11.5–15.5)
WBC: 7.6 10*3/uL (ref 4.0–10.5)
nRBC: 0 % (ref 0.0–0.2)

## 2019-04-04 LAB — LEGIONELLA PNEUMOPHILA SEROGP 1 UR AG: L. pneumophila Serogp 1 Ur Ag: NEGATIVE

## 2019-04-04 LAB — PROTIME-INR
INR: 2.1 — ABNORMAL HIGH (ref 0.8–1.2)
Prothrombin Time: 23.4 seconds — ABNORMAL HIGH (ref 11.4–15.2)

## 2019-04-04 MED ORDER — FERROUS FUMARATE 324 (106 FE) MG PO TABS
1.0000 | ORAL_TABLET | Freq: Every day | ORAL | Status: DC
  Administered 2019-04-04 – 2019-04-07 (×4): 106 mg via ORAL
  Filled 2019-04-04 (×6): qty 1

## 2019-04-04 MED ORDER — WARFARIN SODIUM 2.5 MG PO TABS
2.5000 mg | ORAL_TABLET | Freq: Once | ORAL | Status: AC
  Administered 2019-04-04: 2.5 mg via ORAL
  Filled 2019-04-04: qty 1

## 2019-04-04 MED ORDER — TORSEMIDE 20 MG PO TABS
40.0000 mg | ORAL_TABLET | Freq: Every day | ORAL | Status: DC
  Administered 2019-04-04 – 2019-04-07 (×4): 40 mg via ORAL
  Filled 2019-04-04 (×4): qty 2

## 2019-04-04 MED ORDER — POTASSIUM CHLORIDE CRYS ER 20 MEQ PO TBCR
40.0000 meq | EXTENDED_RELEASE_TABLET | Freq: Every day | ORAL | Status: DC
  Administered 2019-04-04 – 2019-04-07 (×4): 40 meq via ORAL
  Filled 2019-04-04 (×4): qty 2

## 2019-04-04 MED ORDER — FLUTICASONE PROPIONATE 50 MCG/ACT NA SUSP
2.0000 | Freq: Every day | NASAL | Status: DC
  Administered 2019-04-04 – 2019-04-05 (×2): 2 via NASAL
  Filled 2019-04-04: qty 16

## 2019-04-04 MED ORDER — LOPERAMIDE HCL 2 MG PO CAPS
2.0000 mg | ORAL_CAPSULE | ORAL | Status: DC | PRN
  Administered 2019-04-04: 2 mg via ORAL
  Filled 2019-04-04: qty 1

## 2019-04-04 NOTE — Progress Notes (Signed)
Physical Therapy Treatment Patient Details Name: [name] [name] MRN: [id number] DOB: 1931-06-19 Today's Date: 04/04/2019    History of Present Illness 85 year old male with a history of paroxysmal atrial fibrillation, mitral prolapse status post replacement, mixed diastolic and right-sided heart failure, CKD stage III, first-degree AV block, hypertension, thrombocytopenia, prostate cancer with recent diagnosis of local recurrence who came with cough.  Chest x-ray showedRight-sided bronchopneumonia    PT Comments    Pt supine in bed and willing to participate.  Pt with slow and increased time but able to complete bed mobility I, just assistance with lines in bed.  Slight elevation and cueing for hand placement to assist with sit to stand with min A due to LE weakness.  Upon standing initially took side steps then forward-backward for pt.'s comfort then able to ambulate in hallway, no LOB just slow labored movements.  EOS pt left in chair with call bell within reach, chair alarm set and NT in room.      Follow Up Recommendations  Home health PT;Supervision - Intermittent     Equipment Recommendations  None recommended by PT    Recommendations for Other Services       Precautions / Restrictions Precautions Precautions: Fall Precaution Comments: O2, monitor sats, attempting to wean, was not on O2 at home    Mobility  Bed Mobility Overal bed mobility: Modified Independent Bed Mobility: Supine to Sit     Supine to sit: Min guard     General bed mobility comments: for lines and safety  Transfers Overall transfer level: Needs assistance Equipment used: Rolling walker (2 wheeled) Transfers: Sit to/from Stand Sit to Stand: Min assist;From elevated surface         General transfer comment: cues for hand placement, light assist to rise and transition to RW. SpO2=94-97% on 5L [place]  Ambulation/Gait Ambulation/Gait assistance: Min Web designer (Feet): 35 Feet Assistive  device: Rolling walker (2 wheeled)       General Gait Details: initially sidesteps infront of bed then forward/backwards for pt.'s confidence then able to ambulate in hallway   Stairs             Wheelchair Mobility    Modified Rankin (Stroke Patients Only)       Balance                                            Cognition Arousal/Alertness: Awake/alert Behavior During Therapy: WFL for tasks assessed/performed Overall Cognitive Status: Within Functional Limits for tasks assessed                                        Exercises      General Comments        Pertinent Vitals/Pain Pain Assessment: No/denies pain    Home Living                      Prior Function            PT Goals (current goals can now be found in the care plan section)      Frequency    Min 3X/week      PT Plan Current plan remains appropriate    Co-evaluation  AM-PAC PT "6 Clicks" Mobility   Outcome Measure  Help needed turning from your back to your side while in a flat bed without using bedrails?: A Little Help needed moving from lying on your back to sitting on the side of a flat bed without using bedrails?: A Little Help needed moving to and from a bed to a chair (including a wheelchair)?: A Little Help needed standing up from a chair using your arms (e.g., wheelchair or bedside chair)?: A Little Help needed to walk in hospital room?: A Little Help needed climbing 3-5 steps with a railing? : A Lot 6 Click Score: 17    End of Session Equipment Utilized During Treatment: Gait belt Activity Tolerance: Patient tolerated treatment well;Patient limited by fatigue Patient left: in chair;with call bell/phone within reach;with chair alarm set;with nursing/sitter in room(NT in room at EOS) Nurse Communication: Mobility status PT Visit Diagnosis: Difficulty in walking, not elsewhere classified (R26.2);Muscle weakness  (generalized) (M62.81)     Time: 4917-9150 PT Time Calculation (min) (ACUTE ONLY): 33 min  Charges:  $Gait Training: 8-22 mins $Therapeutic Activity: 8-22 mins                     [address], LPTA; CBIS [phone]   Aldona Lento 04/04/2019, 5:18 PM

## 2019-04-04 NOTE — Care Management Important Message (Signed)
Important Message  Patient Details IM Letter given to [place] Case Manager to present to the Patient Name: [name] [name] MRN: [id number] Date of Birth: 1931/07/28   Medicare Important Message Given:  Yes    [name] 04/04/2019, 10:36 AM

## 2019-04-04 NOTE — Progress Notes (Signed)
[place] for warfarin Indication: atrial fibrillation  No Known Allergies  Patient Measurements: Height: 6\' 5"  (195.6 cm) Weight: 178 lb 2.1 oz (80.8 kg) IBW/kg (Calculated) : 89.1  Vital Signs: Temp: 98.6 F (37 C) (05/18 0506) Temp Source: Oral (05/18 0506) BP: 122/76 (05/18 0506) Pulse Rate: 69 (05/18 0506)  Labs: Recent Labs    04/01/19 1953 04/02/19 0309 04/03/19 0253 04/04/19 0343  HGB  --  10.5* 10.4* 10.2*  HCT  --  35.0* 35.0* 35.2*  PLT  --  41* 42* 50*  LABPROT  --  32.4* 31.5* 23.4*  INR  --  3.2* 3.1* 2.1*  CREATININE 1.57*  --  1.17 1.02    Estimated Creatinine Clearance: 59.4 mL/min (by C-G formula based on SCr of 1.02 mg/dL).  Assessment: 85 yo presented with Hx PAF on warfarin, chronic thrombocytopenia, prostate CA, admitted 5/15 with CAP. Pharmacy to dose warfarin while admitted.    Baseline INR therapeutic  Prior anticoagulation: warfarin 2.5 mg daily except 5 mg on Saturdays; LD 5/14  Records show platelet baseline 50-100k plt as far back as 2012  Today, 04/04/2019:  CBC: Hgb stable, Plt stable/low -chronic thrombocytopenia  INR remains therapeutic at 2.1 after coumadin held x 2 days  Major drug interactions: started on broad-spectrum abx here; amiodarone continued from PTA  No bleeding issues per nursing  Goal of Therapy: INR 2-3  Plan:  Coumadin 2.5 mg po x 1 dose today  Daily INR  Monitor for signs of bleeding or thrombosis  Eudelia Bunch, Pharm.D 04/04/2019 11:29 AM

## 2019-04-04 NOTE — Progress Notes (Signed)
[facility]  PROGRESS NOTE  [name] [name] OEU:[id number] DOB: April 27, 1931 DOA: 04/01/2019 PCP: [name], MD   Brief HPI:   85 year old male with a history of paroxysmal atrial fibrillation, mitral prolapse status post replacement, mixed diastolic and right-sided heart failure, CKD stage III, first-degree AV block, hypertension, thrombocytopenia, prostate cancer with recent diagnosis of local recurrence who came with cough.  Chest x-ray showedRight-sided bronchopneumonia.  Patient started on ceftriaxone and Zithromax.  SARS-CoV-2 negative   Subjective   Patient seen and examined, still short of breath.  Demadex was held at time of admission due to pneumonia.  Blood pressure has now improved.  WBC is down to 7000.   Assessment/Plan:     1. Community-acquired pneumonia- Patient presented with right side bronchopneumonia on chest x-ray.  Started on ceftriaxone and Zithromax.  SARS-CoV-2 negative.Urinary strep pneumo antigen is negative.  Urine Legionella is pending.  WBC is down to 7000.  2. Acute kidney injury on CKD stage III-patient presented with creatinine of 1.75, improved to 1.17 at his baseline.  Continue to monitor patient's BUN/creatinine in the hospital.  3. Hypertension-blood pressure has improved, metoprolol has been restarted.  Continue to hold Cardizem.  4. Paroxysmal atrial fibrillation-heart rate is controlled, continue amiodarone.  Metoprolol has been restarted, Cardizem on hold.   Warfarin per pharmacy.  5. Thrombocytopenia-chronic, patient followed by oncology as outpatient.  6. Prostate cancer-history of recent local recurrence. Followed by urology as outpatient.  7. Acute on chronic diastolic CHF-patient requiring 6 L of oxygen via nasal cannula.  Demadex was initially held due to hypotension and pneumonia.  Blood pressure has now improved.  Will restart Demadex 40 mg daily.  Strict intake and output.  Try to wean off oxygen.     CBC: Recent Labs   Lab 04/01/19 1200 04/02/19 0309 04/03/19 0253 04/04/19 0343  WBC 23.0* 18.0* 11.2* 7.6  NEUTROABS 17.3*  --   --   --   HGB 11.6* 10.5* 10.4* 10.2*  HCT 39.3 35.0* 35.0* 35.2*  MCV 85.6 88.4 88.2 87.8  PLT 60* 41* 42* 50*    Basic Metabolic Panel: Recent Labs  Lab 04/01/19 1200 04/01/19 1953 04/03/19 0253 04/03/19 1802 04/04/19 0343  NA 139 141 144  --  141  K 4.0 4.0 3.8  --  4.3  CL 104 107 109  --  111  CO2 23 26 26   --  25  GLUCOSE 141* 121* 111*  --  97  BUN 34* 37* 25*  --  25*  CREATININE 1.75* 1.57* 1.17  --  1.02  CALCIUM 9.0 8.9 9.1  --  8.9  MG  --   --   --  2.3  --      DVT prophylaxis: Warfarin  Code Status: Full code  Family Communication: No family at bedside  Disposition Plan: likely home when medically ready for discharge     Consultants:    Procedures:     Antibiotics:   Anti-infectives (From admission, onward)   Start     Dose/Rate Route Frequency Ordered Stop   04/03/19 1400  azithromycin (ZITHROMAX) tablet 500 mg     500 mg Oral Daily 04/03/19 1032     04/02/19 1500  azithromycin (ZITHROMAX) 500 mg in sodium chloride 0.9 % 250 mL IVPB  Status:  Discontinued     500 mg 250 mL/hr over 60 Minutes Intravenous Every 24 hours 04/01/19 1933 04/03/19 1032   04/02/19 1400  cefTRIAXone (ROCEPHIN) 1 g in sodium chloride  0.9 % 100 mL IVPB     1 g 200 mL/hr over 30 Minutes Intravenous Every 24 hours 04/01/19 1933     04/01/19 1345  cefTRIAXone (ROCEPHIN) 1 g in sodium chloride 0.9 % 100 mL IVPB     1 g 200 mL/hr over 30 Minutes Intravenous  Once 04/01/19 1334 04/01/19 1439   04/01/19 1345  azithromycin (ZITHROMAX) 500 mg in sodium chloride 0.9 % 250 mL IVPB     500 mg 250 mL/hr over 60 Minutes Intravenous  Once 04/01/19 1334 04/01/19 1515   04/01/19 1338  azithromycin (ZITHROMAX) 500 MG injection    Note to Pharmacy:  Ilda Basset   : cabinet override      04/01/19 1338 04/02/19 0144       Objective   Vitals:   04/03/19 1032  04/03/19 1051 04/03/19 2040 04/04/19 0506  BP:  127/77 116/73 122/76  Pulse:  72 75 69  Resp:  20 19 18   Temp:  98.5 F (36.9 C) 98.7 F (37.1 C) 98.6 F (37 C)  TempSrc:  Oral Oral Oral  SpO2:  92% 93% 90%  Weight: 80.8 kg     Height: 6\' 5"  (1.956 m)       Intake/Output Summary (Last 24 hours) at 04/04/2019 1656 Last data filed at 04/04/2019 1625 Gross per 24 hour  Intake 2032.49 ml  Output 3800 ml  Net -1767.51 ml   Filed Weights   04/01/19 1140 04/03/19 1032  Weight: 78 kg 80.8 kg     Physical Examination:  General-appears in no acute distress Heart-S1-S2, regular, no murmur auscultated Lungs-bibasilar crackles auscultated Abdomen-soft, nontender, no organomegaly Extremities-no edema in the lower extremities Neuro-alert, oriented x3, no focal deficit noted   Data Reviewed: I have personally reviewed following labs and imaging studies   Recent Results (from the past 240 hour(s))  Blood culture (routine x 2)     Status: None (Preliminary result)   Collection Time: 04/01/19 12:00 PM  Result Value Ref Range Status   Specimen Description   Final    BLOOD RIGHT FOREARM Performed at [facility], [address] [place], [place] [address]    Special Requests   Final    BOTTLES DRAWN AEROBIC AND ANAEROBIC Blood Culture adequate volume Performed at [facility], [address] [place], [place] [address]    Culture   Final    NO GROWTH 3 DAYS Performed at [facility], [place] [address], [place], [place] [address]    Report Status PENDING  Incomplete  SARS Coronavirus 2 (Hosp order,Performed in [facility] lab via [employer] ID)     Status: None   Collection Time: 04/01/19 12:20 PM  Result Value Ref Range Status   SARS Coronavirus 2 ([employer] ID Now) NEGATIVE NEGATIVE Final    Comment: (NOTE) Interpretive Result Comment(s): COVID 19 Positive SARS CoV 2 target nucleic acids are DETECTED. The SARS CoV 2 RNA is generally detectable in upper and  lower respiratory specimens during the acute phase of infection.  Positive results are indicative of active infection with SARS CoV 2.  Clinical correlation with patient history and other diagnostic information is necessary to determine patient infection status.  Positive results do not rule out bacterial infection or coinfection with other viruses. The expected result is Negative. COVID 19 Negative SARS CoV 2 target nucleic acids are NOT DETECTED. The SARS CoV 2 RNA is generally detectable in upper and lower respiratory specimens during the acute phase  of infection.  Negative results do not preclude SARS CoV 2 infection, do not rule out coinfections with other pathogens, and should not be used as the sole basis for treatment or other patient management decisions.  Negative results must be combined with clinical  observations, patient history, and epidemiological information. The expected result is Negative. Invalid Presence or absence of SARS CoV 2 nucleic acids cannot be determined. Repeat testing was performed on the submitted specimen and repeated Invalid results were obtained.  If clinically indicated, additional testing on a new specimen with an alternate test methodology [id number]) is advised.  The SARS CoV 2 RNA is generally detectable in upper and lower respiratory specimens during the acute phase of infection. The expected result is Negative. Fact Sheet for Patients:  GolfingFamily.no Fact Sheet for Healthcare Providers: https://www.hernandez-brewer.com/ This test is not yet approved or cleared by the [place] FDA and has been authorized for detection and/or diagnosis of SARS CoV 2 by FDA under an Emergency Use Authorization (EUA).  This EUA will remain in effect (meaning this test can be used) for the duration of the COVID19 d eclaration under Section 564(b)(1) of the Act, 21 U.S.C. section [phone] 3(b)(1), unless the authorization  is terminated or revoked sooner. Performed at [facility], [address] [place], [place] [address]   Blood culture (routine x 2)     Status: None (Preliminary result)   Collection Time: 04/01/19  1:30 PM  Result Value Ref Range Status   Specimen Description   Final    BLOOD RIGHT FOREARM Performed at [facility], [place] [address] [place], [place] [address]    Special Requests   Final    BOTTLES DRAWN AEROBIC AND ANAEROBIC Blood Culture adequate volume Performed at [facility], [address] [place], [place] [address]    Culture   Final    NO GROWTH 3 DAYS Performed at [facility], [place] [address], [place], [place] [address]    Report Status PENDING  Incomplete  MRSA PCR Screening     Status: Abnormal   Collection Time: 04/01/19  6:20 PM  Result Value Ref Range Status   MRSA by PCR POSITIVE (A) NEGATIVE Final    Comment:        The GeneXpert MRSA Assay (FDA approved for NASAL specimens only), is one component of a comprehensive MRSA colonization surveillance program. It is not intended to diagnose MRSA infection nor to guide or monitor treatment for MRSA infections. RESULT CALLED TO, READ BACK BY AND VERIFIED WITH: RUDD,B RN @2050  ON 04/01/2019 JACKSON,K Performed at [facility], [place] [address] [place], [place] [address]      Liver Function Tests: Recent Labs  Lab 04/01/19 1200  AST 23  ALT 17  ALKPHOS 55  BILITOT 1.3*  PROT 7.2  ALBUMIN 3.9   No results for input(s): LIPASE, AMYLASE in the last 168 hours. No results for input(s): AMMONIA in the last 168 hours.  Cardiac Enzymes: No results for input(s): CKTOTAL, CKMB, CKMBINDEX, TROPONINI in the last 168 hours. BNP (last 3 results) Recent Labs    04/01/19 1200  BNP 1,036.9*    ProBNP (last 3 results) No results for input(s): PROBNP in the last 8760 hours.    Studies: No results found.  Scheduled Meds: . amiodarone  100 mg Oral Daily   . azithromycin  500 mg Oral Daily  . Chlorhexidine Gluconate Cloth  6 each Topical Q0600  .  cycloSPORINE  1 drop Both Eyes BID  . Ferrous Fumarate  1 tablet Oral Daily  . fluticasone  2 spray Each Nare Daily  . gabapentin  300 mg Oral BID  . mouth rinse  15 mL Mouth Rinse BID  . metoprolol succinate  25 mg Oral Daily  . mupirocin ointment  1 application Nasal BID  . potassium chloride SA  40 mEq Oral Daily  . rosuvastatin  5 mg Oral Daily  . torsemide  40 mg Oral Daily  . traZODone  300 mg Oral QHS  . Warfarin - Pharmacist Dosing Inpatient   Does not apply q1800    Admission status: Inpatient: Based on patients clinical presentation and evaluation of above clinical data, I have made determination that patient meets Inpatient criteria at this time.  Time spent: 20 min  [facility] Pager [phone]. If 7PM-7AM, please contact night-coverage at [email or web address], Office  [phone]  password [place]  04/04/2019, 4:56 PM  LOS: 3 days

## 2019-04-05 ENCOUNTER — Inpatient Hospital Stay (HOSPITAL_COMMUNITY): Payer: Medicare Other

## 2019-04-05 ENCOUNTER — Ambulatory Visit: Payer: Self-pay | Admitting: Urology

## 2019-04-05 LAB — BASIC METABOLIC PANEL
Anion gap: 7 (ref 5–15)
BUN: 25 mg/dL — ABNORMAL HIGH (ref 8–23)
CO2: 27 mmol/L (ref 22–32)
Calcium: 8.6 mg/dL — ABNORMAL LOW (ref 8.9–10.3)
Chloride: 106 mmol/L (ref 98–111)
Creatinine, Ser: 1.15 mg/dL (ref 0.61–1.24)
GFR calc Af Amer: >60 mL/min (ref >60)
GFR calc non Af Amer: 57 mL/min — ABNORMAL LOW (ref >60)
Glucose, Bld: 97 mg/dL (ref 70–99)
Potassium: 3.9 mmol/L (ref 3.5–5.1)
Sodium: 140 mmol/L (ref 135–145)

## 2019-04-05 LAB — PROCALCITONIN: Procalcitonin: 0.29 ng/mL

## 2019-04-05 LAB — CBC WITH DIFFERENTIAL/PLATELET
Abs Immature Granulocytes: 0.07 10*3/uL (ref 0.00–0.07)
Basophils Absolute: 0 10*3/uL (ref 0.0–0.1)
Basophils Relative: 0 %
Eosinophils Absolute: 0 10*3/uL (ref 0.0–0.5)
Eosinophils Relative: 0 %
HCT: 41.4 % (ref 39.0–52.0)
Hemoglobin: 12.3 g/dL — ABNORMAL LOW (ref 13.0–17.0)
Immature Granulocytes: 1 %
Lymphocytes Relative: 14 %
Lymphs Abs: 0.9 10*3/uL (ref 0.7–4.0)
MCH: 26.4 pg (ref 26.0–34.0)
MCHC: 29.7 g/dL — ABNORMAL LOW (ref 30.0–36.0)
MCV: 88.8 fL (ref 80.0–100.0)
Monocytes Absolute: 1.1 10*3/uL — ABNORMAL HIGH (ref 0.1–1.0)
Monocytes Relative: 17 %
Neutro Abs: 4.5 10*3/uL (ref 1.7–7.7)
Neutrophils Relative %: 68 %
Platelets: 68 10*3/uL — ABNORMAL LOW (ref 150–400)
RBC: 4.66 MIL/uL (ref 4.22–5.81)
RDW: 19.5 % — ABNORMAL HIGH (ref 11.5–15.5)
WBC: 6.7 10*3/uL (ref 4.0–10.5)
nRBC: 0 % (ref 0.0–0.2)

## 2019-04-05 LAB — PROTIME-INR
INR: 1.7 — ABNORMAL HIGH (ref 0.8–1.2)
Prothrombin Time: 19.6 seconds — ABNORMAL HIGH (ref 11.4–15.2)

## 2019-04-05 LAB — SARS CORONAVIRUS 2 BY RT PCR (HOSPITAL ORDER, PERFORMED IN ~~LOC~~ HOSPITAL LAB): SARS Coronavirus 2: NEGATIVE

## 2019-04-05 LAB — BRAIN NATRIURETIC PEPTIDE: B Natriuretic Peptide: 607.8 pg/mL — ABNORMAL HIGH (ref 0.0–100.0)

## 2019-04-05 MED ORDER — WARFARIN SODIUM 2.5 MG PO TABS
2.5000 mg | ORAL_TABLET | Freq: Once | ORAL | Status: AC
  Administered 2019-04-05: 2.5 mg via ORAL
  Filled 2019-04-05: qty 1

## 2019-04-05 MED ORDER — GUAIFENESIN ER 600 MG PO TB12
600.0000 mg | ORAL_TABLET | Freq: Two times a day (BID) | ORAL | Status: DC
  Administered 2019-04-05 – 2019-04-07 (×5): 600 mg via ORAL
  Filled 2019-04-05 (×5): qty 1

## 2019-04-05 NOTE — Progress Notes (Signed)
[place] for warfarin Indication: atrial fibrillation  No Known Allergies  Patient Measurements: Height: 6\' 5"  (195.6 cm) Weight: 178 lb 2.1 oz (80.8 kg) IBW/kg (Calculated) : 89.1  Vital Signs: Temp: 98.5 F (36.9 C) (05/19 0649) Temp Source: Oral (05/19 0649) BP: 118/76 (05/19 0649) Pulse Rate: 68 (05/19 0649)  Labs: Recent Labs    04/03/19 0253 04/04/19 0343 04/05/19 0527  HGB 10.4* 10.2*  --   HCT 35.0* 35.2*  --   PLT 42* 50*  --   LABPROT 31.5* 23.4* 19.6*  INR 3.1* 2.1* 1.7*  CREATININE 1.17 1.02 1.15    Estimated Creatinine Clearance: 52.7 mL/min (by C-G formula based on SCr of 1.15 mg/dL).  Assessment: 85 yo presented with Hx PAF on warfarin, chronic thrombocytopenia, prostate CA, admitted 5/15 with CAP. Pharmacy to dose warfarin while admitted.    Baseline INR therapeutic  Prior anticoagulation: warfarin 2.5 mg daily except 5 mg on Saturdays; LD 5/14  Records show platelet baseline 50-100k plt as far back as 2012  Today, 04/05/2019:  CBC: Hgb stable, Plt stable/low -chronic thrombocytopenia  INR dropped to below goal at 1.7 after coumadin held x 2 days and resumed yesterday  Major drug interactions: started on broad-spectrum abx here; amiodarone continued from PTA  No bleeding issues per nursing  Goal of Therapy: INR 2-3  Plan:  Coumadin 2.5 mg po x 1 dose today  Daily INR  Monitor for signs of bleeding or thrombosis  Eudelia Bunch, Pharm.D 04/05/2019 11:20 AM

## 2019-04-05 NOTE — TOC Initial Note (Signed)
Transition of Care [facility]) - Initial/Assessment Note    Patient Details  Name: [name] [name] MRN: [id number] Date of Birth: 1931/09/30  Transition of [facility] CM/SW Contact:    Purcell Mouton, RN Phone Number: 04/05/2019, 4:00 PM  Clinical Narrative:   Pt admitted with CAP. Plan to discharge home with HH/[facility]                Expected Discharge Plan: [place]     Patient Goals and CMS Choice Patient states their goals for this hospitalization and ongoing recovery are:: To get better, go home   Choice offered to / list presented to : Patient  Expected Discharge Plan and Services Expected Discharge Plan: [place]   Discharge Planning Services: CM Consult   Living arrangements for the past 2 months: Single Family Home                           HH Arranged: PT [place] Agency: [facility] [employer]) Date [place]: 04/05/19 Time [place]: [place] Representative spoke with at [place]: [place] Arrangements/Services Living arrangements for the past 2 months: [place] Lives with:: Self Patient language and need for interpreter reviewed:: No Do you feel safe going back to the place where you live?: Yes               Activities of Daily Living Home Assistive Devices/Equipment: Walker (specify type), Other (Comment)(chair lift) ADL Screening (condition at time of admission) Patient's cognitive ability adequate to safely complete daily activities?: Yes Is the patient deaf or have difficulty hearing?: Yes Does the patient have difficulty seeing, even when wearing glasses/contacts?: No Does the patient have difficulty concentrating, remembering, or making decisions?: No Patient able to express need for assistance with ADLs?: Yes Does the patient have difficulty dressing or bathing?: Yes Independently performs ADLs?: No Communication: Independent Dressing  (OT): Needs assistance Is this a change from baseline?: Change from baseline, expected to last <3days Grooming: Needs assistance Is this a change from baseline?: Change from baseline, expected to last <3 days Feeding: Independent Bathing: Needs assistance Is this a change from baseline?: Pre-admission baseline Toileting: Needs assistance Is this a change from baseline?: Change from baseline, expected to last <3 days In/Out Bed: Needs assistance Is this a change from baseline?: Change from baseline, expected to last <3 days Walks in Home: Independent with device (comment) Does the patient have difficulty walking or climbing stairs?: Yes Weakness of Legs: Both Weakness of Arms/Hands: None  Permission Sought/Granted Permission sought to share information with : Case Manager                Emotional Assessment Appearance:: Appears stated age   Affect (typically observed): Accepting Orientation: : Oriented to Self, Oriented to Place, Oriented to  Time, Oriented to Situation      Admission diagnosis:  Hypoxia [R09.02] Community acquired pneumonia of right lung, unspecified part of lung [J18.9] Acute congestive heart failure, unspecified heart failure type [facility]) [I50.9] Patient Active Problem List   Diagnosis Date Noted  . CAP (community acquired pneumonia) 04/01/2019  . Hypoxia 04/01/2019  . Community acquired pneumonia 04/01/2019  . Knee pain, bilateral 08/08/2018  . Mass of right hand 04/12/2018  . Chronic renal insufficiency 10/06/2017  . Spondylosis without myelopathy or radiculopathy, lumbar region 09/16/2017  . Hypokalemia 04/30/2017  . Leg wound, left,  sequela 04/30/2017  . Unstageable pressure ulcer of sacral region ([place]) 04/30/2017  . Fall   . Pressure injury of skin 04/29/2017  . Acute on chronic diastolic (congestive) heart failure ([place])   . PAH (pulmonary artery hypertension) ([place])   . Right-sided heart failure ([place]) 04/26/2017  . Chronic diastolic heart  failure ([place]) 04/21/2017  . Pulmonary hypertension, primary ([place]) 04/21/2017  . Severe tricuspid regurgitation 03/12/2017  . Bilateral lower extremity edema 02/25/2017  . Anticoagulated 02/25/2017  . Varicose veins of right lower extremity with complications 64/33/2951  . Obstructive lung disease (generalized) ([place]) 09/26/2016  . Carpal tunnel syndrome 06/25/2016  . Hereditary and idiopathic peripheral neuropathy 06/25/2016  . Paresthesia 05/27/2016  . Abnormality of gait 05/27/2016  . Chest x-ray abnormality 01/09/2016  . Paroxysmal atrial flutter ([place]) 12/05/2015  . Constipation 06/13/2015  . Encounter for therapeutic drug monitoring 05/25/2014  . Syncope 04/03/2014  . Rhabdomyolysis 04/03/2014  . UTI (urinary tract infection) 04/03/2014  . Acute renal failure ([place]) 04/03/2014  . Leukocytosis 04/03/2014  . Internal nasal lesion 05/15/2013  . Low back pain 04/19/2013  . Melanoma ([place]) 09/30/2012  . Cough 03/26/2012  . Hx of mitral valve repair 11/19/2011  . Long term (current) use of anticoagulants 11/03/2011  . Decubitus skin ulcer 10/28/2011  . Pleural effusion due to congestive heart failure ([place]) 10/28/2011  . Atrial fibrillation ([place]) 10/07/2011  . S/P mitral valve repair 10/01/2011  . Valvular heart disease 08/21/2011  . Hearing loss 04/09/2011  . THROMBOCYTOPENIA 09/19/2010  . ADENOCARCINOMA, PROSTATE 09/17/2010  . CALLUS, LEFT FOOT 09/17/2010  . BACK PAIN, CHRONIC 09/17/2010  . TINEA PEDIS 05/28/2009  . DERMATITIS, ATOPIC 04/10/2009  . HYPERCHOLESTEROLEMIA 06/09/2008  . DEPRESSION 06/09/2008  . HYPERTENSION, BENIGN ESSENTIAL 06/09/2008  . GERD 06/09/2008  . OSTEOARTHRITIS, GENERALIZED, MULTIPLE JOINTS 06/09/2008  . MUSCLE SPASM, BACK 06/09/2008  . PERSONAL HISTORY MALIGNANT NEOPLASM PROSTATE 06/09/2008  . PERSONAL HISTORY OF MALIGNANT MELANOMA OF SKIN 06/09/2008  . ARRHYTHMIA, HX OF 06/09/2008  . Personal history of colonic polyps 06/09/2008   PCP:  [name], MD Pharmacy:   [facility] [place], [place] [address] Phone: [phone] Fax: [phone]  [facility] [place], [place] [address] [place] [address] Phone: [phone] Fax: [phone]     Social Determinants of Health (SDOH) Interventions    Readmission Risk Interventions No flowsheet data found.

## 2019-04-05 NOTE — Progress Notes (Signed)
[facility]  PROGRESS NOTE  [name] [name] WHQ:[id number] DOB: 05-12-1931 DOA: 04/01/2019 PCP: [name], MD   Brief HPI:   85 year old male with a history of paroxysmal atrial fibrillation, mitral prolapse status post replacement, mixed diastolic and right-sided heart failure, CKD stage III, first-degree AV block, hypertension, thrombocytopenia, prostate cancer with recent diagnosis of local recurrence who came with cough.  Chest x-ray showedRight-sided bronchopneumonia.  Patient started on ceftriaxone and Zithromax.  SARS-CoV-2 negative   Subjective   Patient seen and examined, diuresed well with Demadex.  Still required oxygen this morning.  Though he has improved after getting Demadex.  Currently on 0.5L  oxygen via nasal cannula, O2 sats 94%.   Assessment/Plan:     1. Community-acquired pneumonia- Patient presented with right side bronchopneumonia on chest x-ray.  Started on ceftriaxone and Zithromax.  SARS-CoV-2 negative.Urinary strep pneumo antigen is negative.  Urine Legionella is negative.  WBC is down to 7000.  2. Acute hypoxic respiratory failure-likely multifactorial from community-acquired pneumonia as well as diastolic CHF.  Patient diuresed well after starting on Demadex.  He was still requiring 6 L of oxygen via nasal cannula this morning with 91% O2 sats.  I called and discussed with Dr. Chase Caller who recommended repeating SARS-CoV-2 test, procalcitonin, BNP, echocardiogram.  Since then patient has improved.  Will follow these tests but I feel that patient will not be requiring oxygen as he is currently weaned down to 0.5 L oxygen via nasal cannula with O2 sats 94%.  Chest x-ray showed improved variation of the right lung.  3. Acute kidney injury on CKD stage III-patient presented with creatinine of 1.75, improved to 1.15 at his baseline.  Continue to monitor patient's BUN/creatinine in the hospital.  4. Hypertension-blood pressure has improved, metoprolol has  been restarted.  Continue to hold Cardizem.  5. Paroxysmal atrial fibrillation-heart rate is controlled, continue amiodarone.  Metoprolol has been restarted, Cardizem on hold.   Warfarin per pharmacy.  6. Thrombocytopenia-chronic, patient followed by oncology as outpatient.  7. Prostate cancer-history of recent local recurrence. Followed by urology as outpatient.  8. Acute on chronic diastolic CHF-patient requiring 6 L of oxygen via nasal cannula.  Demadex was initially held due to hypotension and pneumonia.  Blood pressure has now improved.  Continue  Demadex 40 mg daily.  Strict intake and output.       CBC: Recent Labs  Lab 04/01/19 1200 04/02/19 0309 04/03/19 0253 04/04/19 0343 04/05/19 1210  WBC 23.0* 18.0* 11.2* 7.6 6.7  NEUTROABS 17.3*  --   --   --  4.5  HGB 11.6* 10.5* 10.4* 10.2* 12.3*  HCT 39.3 35.0* 35.0* 35.2* 41.4  MCV 85.6 88.4 88.2 87.8 88.8  PLT 60* 41* 42* 50* 68*    Basic Metabolic Panel: Recent Labs  Lab 04/01/19 1200 04/01/19 1953 04/03/19 0253 04/03/19 1802 04/04/19 0343 04/05/19 0527  NA 139 141 144  --  141 140  K 4.0 4.0 3.8  --  4.3 3.9  CL 104 107 109  --  111 106  CO2 23 26 26   --  25 27  GLUCOSE 141* 121* 111*  --  97 97  BUN 34* 37* 25*  --  25* 25*  CREATININE 1.75* 1.57* 1.17  --  1.02 1.15  CALCIUM 9.0 8.9 9.1  --  8.9 8.6*  MG  --   --   --  2.3  --   --      DVT prophylaxis: Warfarin  Code Status:  Full code  Family Communication: No family at bedside  Disposition Plan: likely home when medically ready for discharge     Consultants:    Procedures:     Antibiotics:   Anti-infectives (From admission, onward)   Start     Dose/Rate Route Frequency Ordered Stop   04/03/19 1400  azithromycin (ZITHROMAX) tablet 500 mg     500 mg Oral Daily 04/03/19 1032     04/02/19 1500  azithromycin (ZITHROMAX) 500 mg in sodium chloride 0.9 % 250 mL IVPB  Status:  Discontinued     500 mg 250 mL/hr over 60 Minutes Intravenous  Every 24 hours 04/01/19 1933 04/03/19 1032   04/02/19 1400  cefTRIAXone (ROCEPHIN) 1 g in sodium chloride 0.9 % 100 mL IVPB     1 g 200 mL/hr over 30 Minutes Intravenous Every 24 hours 04/01/19 1933     04/01/19 1345  cefTRIAXone (ROCEPHIN) 1 g in sodium chloride 0.9 % 100 mL IVPB     1 g 200 mL/hr over 30 Minutes Intravenous  Once 04/01/19 1334 04/01/19 1439   04/01/19 1345  azithromycin (ZITHROMAX) 500 mg in sodium chloride 0.9 % 250 mL IVPB     500 mg 250 mL/hr over 60 Minutes Intravenous  Once 04/01/19 1334 04/01/19 1515   04/01/19 1338  azithromycin (ZITHROMAX) 500 MG injection    Note to Pharmacy:  Ilda Basset   : cabinet override      04/01/19 1338 04/02/19 0144       Objective   Vitals:   04/05/19 1200 04/05/19 1300 04/05/19 1423 04/05/19 1500  BP:   104/71   Pulse:   71   Resp:   16   Temp:   97.8 F (36.6 C)   TempSrc:   Oral   SpO2: 91% 98% 93% 94%  Weight:      Height:        Intake/Output Summary (Last 24 hours) at 04/05/2019 1616 Last data filed at 04/05/2019 1400 Gross per 24 hour  Intake 2304.44 ml  Output 4100 ml  Net -1795.56 ml   Filed Weights   04/01/19 1140 04/03/19 1032  Weight: 78 kg 80.8 kg     Physical Examination:  General-appears in no acute distress Heart-S1-S2, regular, no murmur auscultated Lungs-clear to auscultation bilaterally, faint crackles in right lung base. Abdomen-soft, nontender, no organomegaly Extremities-no edema in the lower extremities Neuro-alert, oriented x3, no focal deficit noted   Data Reviewed: I have personally reviewed following labs and imaging studies   Recent Results (from the past 240 hour(s))  Blood culture (routine x 2)     Status: None (Preliminary result)   Collection Time: 04/01/19 12:00 PM  Result Value Ref Range Status   Specimen Description   Final    BLOOD RIGHT FOREARM Performed at [facility], [place] [address], [place], [place] [address]    Special Requests   Final    BOTTLES  DRAWN AEROBIC AND ANAEROBIC Blood Culture adequate volume Performed at [facility], [address] [place], [place] [address]    Culture   Final    NO GROWTH 4 DAYS Performed at [facility], [place] [address], [place], [place] [address]    Report Status PENDING  Incomplete  SARS Coronavirus 2 (Hosp order,Performed in [facility] lab via [employer] ID)     Status: None   Collection Time: 04/01/19 12:20 PM  Result Value Ref Range Status   SARS Coronavirus 2 ([employer]  ID Now) NEGATIVE NEGATIVE Final    Comment: (NOTE) Interpretive Result Comment(s): COVID 19 Positive SARS CoV 2 target nucleic acids are DETECTED. The SARS CoV 2 RNA is generally detectable in upper and lower respiratory specimens during the acute phase of infection.  Positive results are indicative of active infection with SARS CoV 2.  Clinical correlation with patient history and other diagnostic information is necessary to determine patient infection status.  Positive results do not rule out bacterial infection or coinfection with other viruses. The expected result is Negative. COVID 19 Negative SARS CoV 2 target nucleic acids are NOT DETECTED. The SARS CoV 2 RNA is generally detectable in upper and lower respiratory specimens during the acute phase of infection.  Negative results do not preclude SARS CoV 2 infection, do not rule out coinfections with other pathogens, and should not be used as the sole basis for treatment or other patient management decisions.  Negative results must be combined with clinical  observations, patient history, and epidemiological information. The expected result is Negative. Invalid Presence or absence of SARS CoV 2 nucleic acids cannot be determined. Repeat testing was performed on the submitted specimen and repeated Invalid results were obtained.  If clinically indicated, additional testing on a new specimen with an alternate test methodology [phone]) is advised.  The  SARS CoV 2 RNA is generally detectable in upper and lower respiratory specimens during the acute phase of infection. The expected result is Negative. Fact Sheet for Patients:  GolfingFamily.no Fact Sheet for Healthcare Providers: https://www.hernandez-brewer.com/ This test is not yet approved or cleared by the [place] FDA and has been authorized for detection and/or diagnosis of SARS CoV 2 by FDA under an Emergency Use Authorization (EUA).  This EUA will remain in effect (meaning this test can be used) for the duration of the COVID19 d eclaration under Section 564(b)(1) of the Act, 21 U.S.C. section [phone] 3(b)(1), unless the authorization is terminated or revoked sooner. Performed at [facility], [address] [place], [place] [address]   Blood culture (routine x 2)     Status: None (Preliminary result)   Collection Time: 04/01/19  1:30 PM  Result Value Ref Range Status   Specimen Description   Final    BLOOD RIGHT FOREARM Performed at [facility], [place] [address] [place], [place] [address]    Special Requests   Final    BOTTLES DRAWN AEROBIC AND ANAEROBIC Blood Culture adequate volume Performed at [place], [address] [place], [place] [address]    Culture   Final    NO GROWTH 4 DAYS Performed at [facility], [place] [address] [place], [place] [address]    Report Status PENDING  Incomplete  MRSA PCR Screening     Status: Abnormal   Collection Time: 04/01/19  6:20 PM  Result Value Ref Range Status   MRSA by PCR POSITIVE (A) NEGATIVE Final    Comment:        The GeneXpert MRSA Assay (FDA approved for NASAL specimens only), is one component of a comprehensive MRSA colonization surveillance program. It is not intended to diagnose MRSA infection nor to guide or monitor treatment for MRSA infections. RESULT CALLED TO, READ BACK BY AND VERIFIED WITH: RUDD,B RN @2050  ON 04/01/2019  JACKSON,K Performed at [facility], [place] [address], [place], [place] [address]      Liver Function Tests: Recent Labs  Lab 04/01/19 1200  AST 23  ALT 17  ALKPHOS 55  BILITOT 1.3*  PROT 7.2  ALBUMIN 3.9   No results for input(s): LIPASE, AMYLASE in the last 168 hours. No results for input(s): AMMONIA in the last 168 hours.  Cardiac Enzymes: No results for input(s): CKTOTAL, CKMB, CKMBINDEX, TROPONINI in the last 168 hours. BNP (last 3 results) Recent Labs    04/01/19 1200 04/05/19 1211  BNP 1,036.9* 607.8*    ProBNP (last 3 results) No results for input(s): PROBNP in the last 8760 hours.    Studies: Dg Chest 2 View  Result Date: 04/05/2019 CLINICAL DATA:  Pneumonia. Continued surveillance. Patient reports feeling better. EXAM: CHEST - 2 VIEW COMPARISON:  04/01/2019. FINDINGS: Cardiomegaly status post CABG. RIGHT lung opacities are approved. Increasing lung volumes bilaterally. No significant effusion. No pneumothorax. IMPRESSION: Improved aeration. RIGHT lung infiltrates have not yet cleared. Continued surveillance warranted. Electronically Signed   By: Staci Righter M.D.   On: 04/05/2019 13:25    Scheduled Meds: . amiodarone  100 mg Oral Daily  . azithromycin  500 mg Oral Daily  . Chlorhexidine Gluconate Cloth  6 each Topical Q0600  . cycloSPORINE  1 drop Both Eyes BID  . Ferrous Fumarate  1 tablet Oral Daily  . fluticasone  2 spray Each Nare Daily  . gabapentin  300 mg Oral BID  . guaiFENesin  600 mg Oral BID  . mouth rinse  15 mL Mouth Rinse BID  . metoprolol succinate  25 mg Oral Daily  . mupirocin ointment  1 application Nasal BID  . potassium chloride SA  40 mEq Oral Daily  . rosuvastatin  5 mg Oral Daily  . torsemide  40 mg Oral Daily  . traZODone  300 mg Oral QHS  . warfarin  2.5 mg Oral ONCE-1800  . Warfarin - Pharmacist Dosing Inpatient   Does not apply q1800    Admission status: Inpatient: Based on patients clinical  presentation and evaluation of above clinical data, I have made determination that patient meets Inpatient criteria at this time.  Time spent: 20 min  [facility] Pager [phone]. If 7PM-7AM, please contact night-coverage at [email or web address], Office  [phone]  password [place]  04/05/2019, 4:16 PM  LOS: 4 days

## 2019-04-06 ENCOUNTER — Inpatient Hospital Stay (HOSPITAL_COMMUNITY): Payer: Medicare Other

## 2019-04-06 DIAGNOSIS — L89300 Pressure ulcer of unspecified buttock, unstageable: Secondary | ICD-10-CM

## 2019-04-06 DIAGNOSIS — I361 Nonrheumatic tricuspid (valve) insufficiency: Secondary | ICD-10-CM

## 2019-04-06 LAB — ECHOCARDIOGRAM COMPLETE
Height: 77 in
Weight: 2850.11 oz

## 2019-04-06 LAB — CULTURE, BLOOD (ROUTINE X 2)
Culture: NO GROWTH
Culture: NO GROWTH
Special Requests: ADEQUATE
Special Requests: ADEQUATE

## 2019-04-06 LAB — BASIC METABOLIC PANEL
Anion gap: 6 (ref 5–15)
BUN: 27 mg/dL — ABNORMAL HIGH (ref 8–23)
CO2: 28 mmol/L (ref 22–32)
Calcium: 8.7 mg/dL — ABNORMAL LOW (ref 8.9–10.3)
Chloride: 106 mmol/L (ref 98–111)
Creatinine, Ser: 1.04 mg/dL (ref 0.61–1.24)
GFR calc Af Amer: >60 mL/min (ref >60)
GFR calc non Af Amer: >60 mL/min (ref >60)
Glucose, Bld: 93 mg/dL (ref 70–99)
Potassium: 4 mmol/L (ref 3.5–5.1)
Sodium: 140 mmol/L (ref 135–145)

## 2019-04-06 LAB — PROCALCITONIN: Procalcitonin: 0.2 ng/mL

## 2019-04-06 LAB — PROTIME-INR
INR: 1.7 — ABNORMAL HIGH (ref 0.8–1.2)
Prothrombin Time: 19.7 seconds — ABNORMAL HIGH (ref 11.4–15.2)

## 2019-04-06 MED ORDER — WARFARIN SODIUM 5 MG PO TABS: 5.0000 mg | ORAL_TABLET | ORAL | Status: DC

## 2019-04-06 MED ORDER — WARFARIN SODIUM 2.5 MG PO TABS
2.5000 mg | ORAL_TABLET | ORAL | Status: DC
  Administered 2019-04-06: 2.5 mg via ORAL
  Filled 2019-04-06: qty 1

## 2019-04-06 MED ORDER — WARFARIN SODIUM 2.5 MG PO TABS: 2.5000 mg | ORAL_TABLET | Freq: Every day | ORAL | Status: DC

## 2019-04-06 NOTE — Care Management Important Message (Signed)
Important Message  Patient DetailsIM Letter given to Marney Doctor RN Case Manager to present to the Patient  Name: [name] [name] MRN: [id number] Date of Birth: [birth year]   Medicare Important Message Given:  Yes    [name] 04/06/2019, 11:33 AM

## 2019-04-06 NOTE — Progress Notes (Signed)
Instructed pt on how to use flutter valve. Good effort. Pt o2 sats 93% on RA

## 2019-04-06 NOTE — Progress Notes (Signed)
PT Cancellation Note  Patient Details Name: [name] [name] MRN: [id number] DOB: 1931/08/01   Cancelled Treatment:    Reason Eval/Treat Not Completed: Patient at procedure or test/unavailable(Will check back at a later time/day as appropriate.)    Geraldine Solar PT, DPT

## 2019-04-06 NOTE — Progress Notes (Signed)
Physical Therapy Treatment Patient Details Name: [name] [name] MRN: [id number] DOB: 1930/12/13 Today's Date: 04/06/2019    History of Present Illness 85 year old male with a history of paroxysmal atrial fibrillation, mitral prolapse status post replacement, mixed diastolic and right-sided heart failure, CKD stage III, first-degree AV block, hypertension, thrombocytopenia, prostate cancer with recent diagnosis of local recurrence who came with cough.  Chest x-ray showedRight-sided bronchopneumonia    PT Comments    Pt received in bed and was agreeable to PT treatment. Pt demo'ing good return for functional mobility this date as he continued to be mod I for bed mobility, min guard for transfers with RW, and min guard-supv for gait with RW this date. He was able to significantly improve his amb distance to 118ft this date. Slow, but steady gait with RW noted. O2 was 90-91% sitting at EOB prior to amb and improved to 93-94% once returned to room after amb. Continue to recommend venue below once medically ready for d/c to further promote strength, gait, balance, and return to PLOF.     Follow Up Recommendations  Home health PT;Supervision - Intermittent     Equipment Recommendations  None recommended by PT    Recommendations for Other Services       Precautions / Restrictions Precautions Precautions: Fall Precaution Comments: IV pole, monitor O2 on RA (has been weaned from supplemental O2) Restrictions Weight Bearing Restrictions: No    Mobility  Bed Mobility Overal bed mobility: Modified Independent Bed Mobility: Supine to Sit     Supine to sit: Modified independent (Device/Increase time)     General bed mobility comments: assisted with IV lines  Transfers Overall transfer level: Needs assistance Equipment used: Rolling walker (2 wheeled) Transfers: Sit to/from [facility] Sit to Stand: From elevated surface;Min guard Stand pivot transfers: Min guard        General transfer comment: min guard for safety during transfers with RW  Ambulation/Gait Ambulation/Gait assistance: Min assist Gait Distance (Feet): 130 Feet Assistive device: Rolling walker (2 wheeled) Gait Pattern/deviations: Step-through pattern;Decreased stride length;Narrow base of support     General Gait Details: initially took 2 laps of sidestep along EOB to warmup legs then able to amb in hallway with RW -- amb distance and endurance much improved this date, 1 very brief standing break to catch breath; O2 90-91% prior to amb and then 93-94% upon return to sitting in chair in room   Stairs             Wheelchair Mobility    Modified Rankin (Stroke Patients Only)       Balance Overall balance assessment: Needs assistance   Sitting balance-Leahy Scale: Good     Standing balance support: Bilateral upper extremity supported Standing balance-Leahy Scale: Fair Standing balance comment: fair with RW                            Cognition Arousal/Alertness: Awake/alert Behavior During Therapy: WFL for tasks assessed/performed Overall Cognitive Status: Within Functional Limits for tasks assessed                                        Exercises      General Comments        Pertinent Vitals/Pain Pain Assessment: No/denies pain    Home Living  Prior Function            PT Goals (current goals can now be found in the care plan section) Acute Rehab PT Goals Patient Stated Goal: go home, get stronger PT Goal Formulation: With patient Time For Goal Achievement: 04/17/19 Potential to Achieve Goals: Good    Frequency    Min 3X/week      PT Plan      Co-evaluation              AM-PAC PT "6 Clicks" Mobility   Outcome Measure  Help needed turning from your back to your side while in a flat bed without using bedrails?: A Little Help needed moving from lying on your back to sitting  on the side of a flat bed without using bedrails?: A Little Help needed moving to and from a bed to a chair (including a wheelchair)?: A Little Help needed standing up from a chair using your arms (e.g., wheelchair or bedside chair)?: A Little Help needed to walk in hospital room?: A Little Help needed climbing 3-5 steps with a railing? : A Lot 6 Click Score: 17    End of Session Equipment Utilized During Treatment: Gait belt Activity Tolerance: Patient tolerated treatment well;Patient limited by fatigue Patient left: in chair;with call bell/phone within reach;with chair alarm set;with nursing/sitter in room(NT in room at EOS) Nurse Communication: Mobility status(pt reporting condom cath came off and asked PT notify RN, which PT did after session) PT Visit Diagnosis: Difficulty in walking, not elsewhere classified (R26.2);Muscle weakness (generalized) (M62.81)     Time: 8315-1761 PT Time Calculation (min) (ACUTE ONLY): 18 min  Charges:  $Gait Training: 8-22 mins                       Geraldine Solar PT, DPT

## 2019-04-06 NOTE — Progress Notes (Addendum)
[name] [name] for warfarin Indication: atrial fibrillation  No Known Allergies  Patient Measurements: Height: 6\' 5"  (195.6 cm) Weight: 178 lb 2.1 oz (80.8 kg) IBW/kg (Calculated) : 89.1  Vital Signs: Temp: 98.1 F (36.7 C) (05/20 0558) Temp Source: Oral (05/20 0558) BP: 120/79 (05/20 0558) Pulse Rate: 70 (05/20 0558)  Labs: Recent Labs    04/04/19 0343 04/05/19 0527 04/05/19 1210 04/06/19 0428  HGB 10.2*  --  12.3*  --   HCT 35.2*  --  41.4  --   PLT 50*  --  68*  --   LABPROT 23.4* 19.6*  --  19.7*  INR 2.1* 1.7*  --  1.7*  CREATININE 1.02 1.15  --  1.04    Estimated Creatinine Clearance: 58.3 mL/min (by C-G formula based on SCr of 1.04 mg/dL).  Assessment: 85 yo presented with Hx PAF on warfarin, chronic thrombocytopenia, prostate CA, admitted 5/15 with CAP. Pharmacy to dose warfarin while admitted.    Baseline INR therapeutic  Prior anticoagulation: warfarin 2.5 mg daily except 5 mg on Saturdays; LD 5/14  Records show platelet baseline 50-100k plt as far back as 2012  Today, 04/06/2019:  CBC: Hgb stable, Plt stable/low -chronic thrombocytopenia  INR remains below goal at 1.7 after coumadin held x 2 days and resumed 5/18.    Major drug interactions: started on broad-spectrum abx here; amiodarone continued from PTA  No bleeding issues per nursing  Goal of Therapy: INR 2-3  Plan:  Resume home dose of 2.5 daily x 5 mg on Saturdays  Daily INR  Monitor for signs of bleeding or thrombosis  Eudelia Bunch, Pharm.D 04/06/2019 10:26 AM

## 2019-04-06 NOTE — Progress Notes (Signed)
PROGRESS NOTE    [name] [name]  ALP:[id number] DOB: 01/13/1931 DOA: 04/01/2019 PCP: [name], MD      Brief Narrative:  [name] [name] is a 85 y.o. M with hx pAF on warfarin and amio, dCHF, HTN, CKD III baseline 1.1, HTN, thrombocytopenia, prostate CA who presented with dyspnea.  In ER, CXR showed R sided pneumonia, started on IV antibiotics and admitted.      Assessment & Plan:  Community acquired pneumonia of right lower lobe CXR repeat yesterday showed improved aeration.  Down to 1L O2 now.  Not on home O2.  Mentation good, HR and RR nrmal. -Continue CTX, day 6 of 7 -Stop azithromycin, completed 6 days -Repeat CXR in 4-5 weeks -Start chest PT and flutter valve   Acute on chronic diastolic CHF Net negative 3.7 today.   Cr stable.  K normal. -Stop LR -Continue home Demadex  AKI on CKD III Cr 1.7 on admission.  Resolved in hospital.  Thrombocytopenia Stable relative to baseline  Hypertension BP low to normal -Continue metoprolol, Crestor -Hold dilt for now  Paroxysmal atrial fibrillation History MVR for severe prolapse -Continue amiodarone -Continue warfarin -Hold diltiazem  Prostate cancer  Anemia of chronic disease -Continue Hemocyte  Other -Continue gabapentin  Mild protein calorie malnutrition  Pressure injury buttocks, unstageable, POA     MDM and disposition: The below labs and imaging reports were reviewed and summarized above.  Medication management as above.  The patient was admitted with RLL pneumonia.  He is now improving, but still requiring supplemental O2, which is new from baseline and it is my opinion that discharge at this time presents unnecessary risk of clinical deterioration and readmission for hypoxic respiratory failure.    PT recommend HHPT, likely home Thurs or Friday.     DVT prophylaxis: N/A on warfarin Code Status: FULL Family Communication: Daughter by phone    Consultants:   None  Procedures:    None  Antimicrobials:   Ceftriaxone  5/15 >>  Azithromycin 5/15 >> 5/20   Subjective: Feels well.  No dyspnea, no confusion.  No fever, no respiratory distress.  No vomiting, diarrhea.  Objective: Vitals:   04/05/19 2144 04/06/19 0558 04/06/19 1223 04/06/19 1430  BP: (!) 133/93 120/79  118/69  Pulse: 68 70  69  Resp: 16 16  14   Temp: 98.2 F (36.8 C) 98.1 F (36.7 C)  97.8 F (36.6 C)  TempSrc: Oral Oral  Oral  SpO2: (!) 89% 91% 90% 92%  Weight:      Height:        Intake/Output Summary (Last 24 hours) at 04/06/2019 1457 Last data filed at 04/06/2019 1414 Gross per 24 hour  Intake 120 ml  Output 5575 ml  Net -5455 ml   Filed Weights   04/01/19 1140 04/03/19 1032  Weight: 78 kg 80.8 kg    Examination: General appearance:  adult male, alert and in no acut distress.   HEENT: Anicteric, conjunctiva pink, lids and lashes normal. No nasal deformity, discharge, epistaxis.  Lips moist.   Skin: Warm and dry.  no jaundice.  No suspicious rashes or lesions. Cardiac: RRR, nl S1-S2, no murmurs appreciated.  Capillary refill is brisk.  JVP not visible.  No LE edema.  Radia  pulses 2+ and symmetric. Respiratory: Normal respiratory rate and rhythm.  Diminished throughout, rales in left base. Abdomen: Abdomen soft.  no TTP. No ascites, distension, hepatosplenomegaly.   MSK: No deformities or effusions.  Thenar wasting, diffuse mild loss  of subcutaneous muscle mass and fat. Neuro: Awake and alert.  EOMI, moves all extremities. Speech fluent.    Psych: Sensorium intact and responding to questions, attention normal. Affect normal.  Judgment and insight appear normal.    Data Reviewed: I have personally reviewed following labs and imaging studies:  CBC: Recent Labs  Lab 04/01/19 1200 04/02/19 0309 04/03/19 0253 04/04/19 0343 04/05/19 1210  WBC 23.0* 18.0* 11.2* 7.6 6.7  NEUTROABS 17.3*  --   --   --  4.5  HGB 11.6* 10.5* 10.4* 10.2* 12.3*  HCT 39.3 35.0* 35.0* 35.2* 41.4   MCV 85.6 88.4 88.2 87.8 88.8  PLT 60* 41* 42* 50* 68*   Basic Metabolic Panel: Recent Labs  Lab 04/01/19 1953 04/03/19 0253 04/03/19 1802 04/04/19 0343 04/05/19 0527 04/06/19 0428  NA 141 144  --  141 140 140  K 4.0 3.8  --  4.3 3.9 4.0  CL 107 109  --  111 106 106  CO2 26 26  --  25 27 28   GLUCOSE 121* 111*  --  97 97 93  BUN 37* 25*  --  25* 25* 27*  CREATININE 1.57* 1.17  --  1.02 1.15 1.04  CALCIUM 8.9 9.1  --  8.9 8.6* 8.7*  MG  --   --  2.3  --   --   --    GFR: Estimated Creatinine Clearance: 58.3 mL/min (by C-G formula based on SCr of 1.04 mg/dL). Liver Function Tests: Recent Labs  Lab 04/01/19 1200  AST 23  ALT 17  ALKPHOS 55  BILITOT 1.3*  PROT 7.2  ALBUMIN 3.9   No results for input(s): LIPASE, AMYLASE in the last 168 hours. No results for input(s): AMMONIA in the last 168 hours. Coagulation Profile: Recent Labs  Lab 04/02/19 0309 04/03/19 0253 04/04/19 0343 04/05/19 0527 04/06/19 0428  INR 3.2* 3.1* 2.1* 1.7* 1.7*   Cardiac Enzymes: No results for input(s): CKTOTAL, CKMB, CKMBINDEX, TROPONINI in the last 168 hours. BNP (last 3 results) No results for input(s): PROBNP in the last 8760 hours. HbA1C: No results for input(s): HGBA1C in the last 72 hours. CBG: No results for input(s): GLUCAP in the last 168 hours. Lipid Profile: No results for input(s): CHOL, HDL, LDLCALC, TRIG, CHOLHDL, LDLDIRECT in the last 72 hours. Thyroid Function Tests: No results for input(s): TSH, T4TOTAL, FREET4, T3FREE, THYROIDAB in the last 72 hours. Anemia Panel: No results for input(s): VITAMINB12, FOLATE, FERRITIN, TIBC, IRON, RETICCTPCT in the last 72 hours. Urine analysis:    Component Value Date/Time   COLORURINE YELLOW 11/01/2018 [place] 11/01/2018 1234   LABSPEC 1.010 11/01/2018 1234   PHURINE 5.5 11/01/2018 1234   GLUCOSEU NEGATIVE 11/01/2018 1234   HGBUR NEGATIVE 11/01/2018 1234   BILIRUBINUR NEGATIVE 11/01/2018 1234   BILIRUBINUR  negative 09/22/2017 1040   KETONESUR NEGATIVE 11/01/2018 1234   PROTEINUR negative 09/22/2017 1040   PROTEINUR NEGATIVE 04/26/2017 2259   UROBILINOGEN 0.2 11/01/2018 1234   NITRITE NEGATIVE 11/01/2018 1234   LEUKOCYTESUR NEGATIVE 11/01/2018 1234   Sepsis Labs: @LABRCNTIP (procalcitonin:4,lacticacidven:4)  ) Recent Results (from the past 240 hour(s))  Blood culture (routine x 2)     Status: None (Preliminary result)   Collection Time: 04/01/19 12:00 PM  Result Value Ref Range Status   Specimen Description   Final    BLOOD RIGHT FOREARM Performed at [facility], [place] [address] [place], [place] [address]    Special Requests   Final  BOTTLES DRAWN AEROBIC AND ANAEROBIC Blood Culture adequate volume Performed at [facility], [address] [place], [place] [address]    Culture   Final    NO GROWTH 4 DAYS Performed at [facility], [place] [address], [place], [place] [address]    Report Status PENDING  Incomplete  SARS Coronavirus 2 (Hosp order,Performed in [facility] lab via [employer] ID)     Status: None   Collection Time: 04/01/19 12:20 PM  Result Value Ref Range Status   SARS Coronavirus 2 (Abbott ID Now) NEGATIVE NEGATIVE Final    Comment: (NOTE) Interpretive Result Comment(s): COVID 19 Positive SARS CoV 2 target nucleic acids are DETECTED. The SARS CoV 2 RNA is generally detectable in upper and lower respiratory specimens during the acute phase of infection.  Positive results are indicative of active infection with SARS CoV 2.  Clinical correlation with patient history and other diagnostic information is necessary to determine patient infection status.  Positive results do not rule out bacterial infection or coinfection with other viruses. The expected result is Negative. COVID 19 Negative SARS CoV 2 target nucleic acids are NOT DETECTED. The SARS CoV 2 RNA is generally detectable in upper and lower respiratory specimens during the acute phase of  infection.  Negative results do not preclude SARS CoV 2 infection, do not rule out coinfections with other pathogens, and should not be used as the sole basis for treatment or other patient management decisions.  Negative results must be combined with clinical  observations, patient history, and epidemiological information. The expected result is Negative. Invalid Presence or absence of SARS CoV 2 nucleic acids cannot be determined. Repeat testing was performed on the submitted specimen and repeated Invalid results were obtained.  If clinically indicated, additional testing on a new specimen with an alternate test methodology [phone]) is advised.  The SARS CoV 2 RNA is generally detectable in upper and lower respiratory specimens during the acute phase of infection. The expected result is Negative. Fact Sheet for Patients:  GolfingFamily.no Fact Sheet for Healthcare Providers: https://www.hernandez-brewer.com/ This test is not yet approved or cleared by the [place] FDA and has been authorized for detection and/or diagnosis of SARS CoV 2 by FDA under an Emergency Use Authorization (EUA).  This EUA will remain in effect (meaning this test can be used) for the duration of the COVID19 d eclaration under Section 564(b)(1) of the Act, 21 U.S.C. section [phone] 3(b)(1), unless the authorization is terminated or revoked sooner. Performed at [facility], [address] [place], [place] [address]   Blood culture (routine x 2)     Status: None (Preliminary result)   Collection Time: 04/01/19  1:30 PM  Result Value Ref Range Status   Specimen Description   Final    BLOOD RIGHT FOREARM Performed at [facility], [place] [address] [place], [place] [address]    Special Requests   Final    BOTTLES DRAWN AEROBIC AND ANAEROBIC Blood Culture adequate volume Performed at [facility], [address] [place], [place] [address]     Culture   Final    NO GROWTH 4 DAYS Performed at [facility], [place] [address], [place], [place] [address]    Report Status PENDING  Incomplete  MRSA PCR Screening     Status: Abnormal   Collection Time: 04/01/19  6:20 PM  Result Value Ref Range Status   MRSA by PCR POSITIVE (  A) NEGATIVE Final    Comment:        The GeneXpert MRSA Assay (FDA approved for NASAL specimens only), is one component of a comprehensive MRSA colonization surveillance program. It is not intended to diagnose MRSA infection nor to guide or monitor treatment for MRSA infections. RESULT CALLED TO, READ BACK BY AND VERIFIED WITH: RUDD,B RN @2050  ON 04/01/2019 JACKSON,K Performed at [facility], [place] [address], [place], [place] [address]   SARS Coronavirus 2 (CEPHEID - Performed in [place] hospital lab), Hosp Order     Status: None   Collection Time: 04/05/19  4:42 PM  Result Value Ref Range Status   SARS Coronavirus 2 NEGATIVE NEGATIVE Final    Comment: (NOTE) If result is NEGATIVE SARS-CoV-2 target nucleic acids are NOT DETECTED. The SARS-CoV-2 RNA is generally detectable in upper and lower  respiratory specimens during the acute phase of infection. The lowest  concentration of SARS-CoV-2 viral copies this assay can detect is 250  copies / mL. A negative result does not preclude SARS-CoV-2 infection  and should not be used as the sole basis for treatment or other  patient management decisions.  A negative result may occur with  improper specimen collection / handling, submission of specimen other  than nasopharyngeal swab, presence of viral mutation(s) within the  areas targeted by this assay, and inadequate number of viral copies  (<250 copies / mL). A negative result must be combined with clinical  observations, patient history, and epidemiological information. If result is POSITIVE SARS-CoV-2 target nucleic acids are DETECTED. The SARS-CoV-2 RNA is generally detectable  in upper and lower  respiratory specimens dur ing the acute phase of infection.  Positive  results are indicative of active infection with SARS-CoV-2.  Clinical  correlation with patient history and other diagnostic information is  necessary to determine patient infection status.  Positive results do  not rule out bacterial infection or co-infection with other viruses. If result is PRESUMPTIVE POSTIVE SARS-CoV-2 nucleic acids MAY BE PRESENT.   A presumptive positive result was obtained on the submitted specimen  and confirmed on repeat testing.  While 2019 novel coronavirus  (SARS-CoV-2) nucleic acids may be present in the submitted sample  additional confirmatory testing may be necessary for epidemiological  and / or clinical management purposes  to differentiate between  SARS-CoV-2 and other Sarbecovirus currently known to infect humans.  If clinically indicated additional testing with an alternate test  methodology [phone]) is advised. The SARS-CoV-2 RNA is generally  detectable in upper and lower respiratory sp ecimens during the acute  phase of infection. The expected result is Negative. Fact Sheet for Patients:  StrictlyIdeas.no Fact Sheet for Healthcare Providers: BankingDealers.co.za This test is not yet approved or cleared by the [place] FDA and has been authorized for detection and/or diagnosis of SARS-CoV-2 by FDA under an Emergency Use Authorization (EUA).  This EUA will remain in effect (meaning this test can be used) for the duration of the COVID-19 declaration under Section 564(b)(1) of the Act, 21 U.S.C. section 360bbb-3(b)(1), unless the authorization is terminated or revoked sooner. Performed at [facility], [place] [address], [place], [place] [address]          Radiology Studies: Dg Chest 2 View  Result Date: 04/05/2019 CLINICAL DATA:  Pneumonia. Continued surveillance. Patient reports  feeling better. EXAM: CHEST - 2 VIEW COMPARISON:  04/01/2019. FINDINGS: Cardiomegaly status post CABG. RIGHT lung opacities are approved. Increasing lung volumes bilaterally. No significant  effusion. No pneumothorax. IMPRESSION: Improved aeration. RIGHT lung infiltrates have not yet cleared. Continued surveillance warranted. Electronically Signed   By: Staci Righter M.D.   On: 04/05/2019 13:25        Scheduled Meds: . amiodarone  100 mg Oral Daily  . azithromycin  500 mg Oral Daily  . Chlorhexidine Gluconate Cloth  6 each Topical Q0600  . cycloSPORINE  1 drop Both Eyes BID  . Ferrous Fumarate  1 tablet Oral Daily  . fluticasone  2 spray Each Nare Daily  . gabapentin  300 mg Oral BID  . guaiFENesin  600 mg Oral BID  . mouth rinse  15 mL Mouth Rinse BID  . metoprolol succinate  25 mg Oral Daily  . potassium chloride SA  40 mEq Oral Daily  . rosuvastatin  5 mg Oral Daily  . torsemide  40 mg Oral Daily  . traZODone  300 mg Oral QHS  . warfarin  2.5 mg Oral Once per day on Sun Mon Tue Wed Thu Fri   And  . [START ON 04/09/2019] warfarin  5 mg Oral Q Sat-1800  . Warfarin - Pharmacist Dosing Inpatient   Does not apply q1800   Continuous Infusions: . sodium chloride Stopped (04/01/19 1636)  . cefTRIAXone (ROCEPHIN)  IV 1 g (04/06/19 1436)     LOS: 5 days    Time spent: 25 miutes    Edwin Dada, MD [facility] 04/06/2019, 2:57 PM     Please page through [place]:  [email or web address] Password [email or web address] If 7PM-7AM, please contact night-coverage

## 2019-04-06 NOTE — Progress Notes (Addendum)
  Echocardiogram 2D Echocardiogram has been performed.  Technically difficult study due to small rib spacing.   Kriste Broman L Androw 04/06/2019, 1:52 PM

## 2019-04-07 LAB — BASIC METABOLIC PANEL
Anion gap: 6 (ref 5–15)
BUN: 28 mg/dL — ABNORMAL HIGH (ref 8–23)
CO2: 28 mmol/L (ref 22–32)
Calcium: 8.6 mg/dL — ABNORMAL LOW (ref 8.9–10.3)
Chloride: 106 mmol/L (ref 98–111)
Creatinine, Ser: 1.31 mg/dL — ABNORMAL HIGH (ref 0.61–1.24)
GFR calc Af Amer: 57 mL/min — ABNORMAL LOW (ref >60)
GFR calc non Af Amer: 49 mL/min — ABNORMAL LOW (ref >60)
Glucose, Bld: 91 mg/dL (ref 70–99)
Potassium: 3.9 mmol/L (ref 3.5–5.1)
Sodium: 140 mmol/L (ref 135–145)

## 2019-04-07 LAB — PROTIME-INR
INR: 1.8 — ABNORMAL HIGH (ref 0.8–1.2)
Prothrombin Time: 20.6 seconds — ABNORMAL HIGH (ref 11.4–15.2)

## 2019-04-07 LAB — CBC
HCT: 38 % — ABNORMAL LOW (ref 39.0–52.0)
Hemoglobin: 11.5 g/dL — ABNORMAL LOW (ref 13.0–17.0)
MCH: 26.1 pg (ref 26.0–34.0)
MCHC: 30.3 g/dL (ref 30.0–36.0)
MCV: 86.2 fL (ref 80.0–100.0)
Platelets: 79 10*3/uL — ABNORMAL LOW (ref 150–400)
RBC: 4.41 MIL/uL (ref 4.22–5.81)
RDW: 18.8 % — ABNORMAL HIGH (ref 11.5–15.5)
WBC: 6.5 10*3/uL (ref 4.0–10.5)
nRBC: 0 % (ref 0.0–0.2)

## 2019-04-07 LAB — RSV(RESPIRATORY SYNCYTIAL VIRUS) AB, BLOOD: RSV Ab: NEGATIVE

## 2019-04-07 MED ORDER — CEFDINIR 300 MG PO CAPS: 300.0000 mg | ORAL_CAPSULE | Freq: Two times a day (BID) | ORAL | 0 refills | Status: DC

## 2019-04-07 MED ORDER — GUAIFENESIN ER 600 MG PO TB12: 600.0000 mg | ORAL_TABLET | Freq: Two times a day (BID) | ORAL | 0 refills | Status: AC | PRN

## 2019-04-07 NOTE — Progress Notes (Signed)
[place] for warfarin Indication: atrial fibrillation  No Known Allergies  Patient Measurements: Height: 6\' 5"  (195.6 cm) Weight: 178 lb 2.1 oz (80.8 kg) IBW/kg (Calculated) : 89.1  Vital Signs: Temp: 98.3 F (36.8 C) (05/21 0533) Temp Source: Oral (05/21 0533) BP: 104/64 (05/21 0533) Pulse Rate: 68 (05/21 0533)  Labs: Recent Labs    04/05/19 0527 04/05/19 1210 04/06/19 0428 04/07/19 0529  HGB  --  12.3*  --  11.5*  HCT  --  41.4  --  38.0*  PLT  --  68*  --  79*  LABPROT 19.6*  --  19.7* 20.6*  INR 1.7*  --  1.7* 1.8*  CREATININE 1.15  --  1.04 1.31*    Estimated Creatinine Clearance: 46.3 mL/min (A) (by C-G formula based on SCr of 1.31 mg/dL (H)).  Assessment: 85 yo presented with Hx PAF on warfarin, chronic thrombocytopenia, prostate CA, admitted 5/15 with CAP. Pharmacy to dose warfarin while admitted.    Baseline INR therapeutic  Prior anticoagulation: warfarin 2.5 mg daily except 5 mg on Saturdays; LD 5/14  Records show platelet baseline 50-100k plt as far back as 2012  Today, 04/07/2019:  CBC: Hgb stable, Plt stable/low -chronic thrombocytopenia  INR remains below goal at 1.8 after coumadin held x 2 days and resumed 5/18.    Major drug interactions: started on broad-spectrum abx here; amiodarone continued from PTA  No bleeding issues per nursing  Goal of Therapy: INR 2-3  Plan:  continue home dose of 2.5 daily x 5 mg on Saturdays  Daily INR  Monitor for signs of bleeding or thrombosis  Eudelia Bunch, Pharm.D 04/07/2019 9:39 AM

## 2019-04-07 NOTE — TOC Transition Note (Signed)
Transition of Care [facility]) - CM/SW Discharge Note   Patient Details  Name: [name] [name] MRN: [id number] Date of Birth: 09/09/1931  Transition of Care [facility]) CM/SW Contact:  Dessa Phi, RN Phone Number: 04/07/2019, 11:21 AM   Clinical Narrative:  D/c home w/HHPT-[facility] already following for d/c. No further CM needs.     Final next level of care: [place]     Patient Goals and CMS Choice Patient states their goals for this hospitalization and ongoing recovery are:: To get better, go home   Choice offered to / list presented to : Patient  Discharge Placement                       Discharge Plan and Services   Discharge Planning Services: CM Consult                      HH Arranged: PT [place] Agency: [facility] Date [place]: 04/05/19 Time [place]: 1559 Representative spoke with at [place]: [place] ([place]) Interventions     Readmission Risk Interventions Readmission Risk Prevention Plan 04/06/2019  Transportation Screening Complete  PCP or Specialist Appt within 3-5 Days Not Complete  Not Complete comments not yet ready for d/c  HRI or [place] Complete  Social Work Consult for [place] Planning/Counseling Not Complete  SW consult not completed comments no needs identified  Palliative Care Screening Not Applicable  Medication Review Press photographer) Complete  Some recent data might be hidden

## 2019-04-07 NOTE — Progress Notes (Signed)
Physical Therapy Treatment Patient Details Name: [name] [name] MRN: [id number] DOB: 29-Sep-1931 Today's Date: 04/07/2019    History of Present Illness 85 year old male with a history of paroxysmal atrial fibrillation, mitral prolapse status post replacement, mixed diastolic and right-sided heart failure, CKD stage III, first-degree AV block, hypertension, thrombocytopenia, prostate cancer with recent diagnosis of local recurrence who came with cough.  Chest x-ray showedRight-sided bronchopneumonia    PT Comments    Patient seen for mobility progression. Able to stand at bedside to assist with dressing - Min guard throughout for balance. Ambulating in hallway with RW on RA - SpO2 90% throughout. Patient with flexed posturing - likely at baseline. Will continue to recommend HHPT.     Follow Up Recommendations  Home health PT;Supervision - Intermittent     Equipment Recommendations  None recommended by PT    Recommendations for Other Services       Precautions / Restrictions Precautions Precautions: Fall Restrictions Weight Bearing Restrictions: No    Mobility  Bed Mobility Overal bed mobility: Modified Independent                Transfers Overall transfer level: Needs assistance Equipment used: Rolling walker (2 wheeled) Transfers: Sit to/from Stand;Stand Pivot Transfers Sit to Stand: From elevated surface;Min guard Stand pivot transfers: Min guard       General transfer comment: for safety  Ambulation/Gait Ambulation/Gait assistance: Min assist;Min guard Gait Distance (Feet): 140 Feet Assistive device: Rolling walker (2 wheeled) Gait Pattern/deviations: Step-through pattern;Decreased stride length;Trunk flexed Gait velocity: decreased   General Gait Details: flexed posture throughout; able to stand and perform hand hygeine as well as pull up underwear and pants; SpO2 on RA with mobility 90%   Stairs             Wheelchair Mobility    Modified  [place] (Stroke Patients Only)       Balance Overall balance assessment: Needs assistance Sitting-balance support: No upper extremity supported;Feet supported Sitting balance-Leahy Scale: Good     Standing balance support: Bilateral upper extremity supported;During functional activity Standing balance-Leahy Scale: Fair Standing balance comment: fair with RW                            Cognition Arousal/Alertness: Awake/alert Behavior During Therapy: WFL for tasks assessed/performed Overall Cognitive Status: Within Functional Limits for tasks assessed                                        Exercises      General Comments        Pertinent Vitals/Pain Pain Assessment: No/denies pain    Home Living                      Prior Function            PT Goals (current goals can now be found in the care plan section) Acute Rehab PT Goals Patient Stated Goal: go home, get stronger PT Goal Formulation: With patient Time For Goal Achievement: 04/17/19 Potential to Achieve Goals: Good Progress towards PT goals: Progressing toward goals    Frequency    Min 3X/week      PT Plan Current plan remains appropriate    Co-evaluation              AM-PAC PT "6 Clicks" Mobility  Outcome Measure  Help needed turning from your back to your side while in a flat bed without using bedrails?: A Little Help needed moving from lying on your back to sitting on the side of a flat bed without using bedrails?: A Little Help needed moving to and from a bed to a chair (including a wheelchair)?: A Little Help needed standing up from a chair using your arms (e.g., wheelchair or bedside chair)?: A Little Help needed to walk in hospital room?: A Little Help needed climbing 3-5 steps with a railing? : A Lot 6 Click Score: 17    End of Session Equipment Utilized During Treatment: Gait belt Activity Tolerance: Patient tolerated treatment well Patient  left: in bed;with call bell/phone within reach Nurse Communication: Mobility status PT Visit Diagnosis: Difficulty in walking, not elsewhere classified (R26.2);Muscle weakness (generalized) (M62.81)     Time: 1751-0258 PT Time Calculation (min) (ACUTE ONLY): 23 min  Charges:  $Gait Training: 8-22 mins $Therapeutic Activity: 8-22 mins                      Lanney Gins, PT, DPT Supplemental Physical Therapist 04/07/19 12:15 PM Pager: [phone] Office: [phone]

## 2019-04-07 NOTE — Discharge Summary (Signed)
Physician Discharge Summary  ELMIN WIEDERHOLT CXK:[id number] DOB: 27-Jun-1931 DOA: 04/01/2019  PCP: [name], MD  Admit date: 04/01/2019 Discharge date: 04/07/2019  Admitted From: Home Disposition:  Home  Discharge Condition:Stable CODE STATUS:FULL Diet recommendation: Heart Healthy  Brief/Interim Summary: Mr. Nicoson is a 85 y.o. M with hx pAF on warfarin and amio, dCHF, HTN, CKD III baseline 1.1, HTN, thrombocytopenia, prostate CA who presented with dyspnea. In ER, CXR showed R sided pneumonia.Civod-19 negative.He was started on IV antibiotics .  Patient respiratory status gradually improved. Initially he required oxygen for saturation maintenance.  Currently he is saturating fine on room air.  Patient was also evaluated by physical therapy and recommended home health on discharge. Patient is hemodynamically stable for discharge today.  Following problems were addressed during his hospitalization:   Community acquired pneumonia of right lower lobe CXR repeat yesterday showed improved aeration. On room air now.  Not on home O2.  Mentation good, HR and RR nrmal. Was on IV antibiotics.Changed to oral now. Repeat CXR in 4-5 weeks  Acute on chronic diastolic CHF Net negative 3.7 today. Stopped fluids. Continue home Demadex  AKI on CKD III Cr 1.7 on admission.  Improved.Check BMP in a week during follow up with your PCP.  Thrombocytopenia Stable relative to baseline  Hypertension Resume home meds.BP soft. Cardizem will be held on discharge.  Paroxysmal atrial fibrillation History MVR for severe prolapse Continue amiodarone,Continue warfarin  Prostate cancer  Anemia of chronic disease Continue to monitor as an outpatient  Mild protein calorie malnutrition  Pressure injury buttocks, unstageable, POA     Discharge Diagnoses:  Principal Problem:   CAP (community acquired pneumonia) Active Problems:   ADENOCARCINOMA, PROSTATE   THROMBOCYTOPENIA    HYPERTENSION, BENIGN ESSENTIAL   PERSONAL HISTORY MALIGNANT NEOPLASM PROSTATE   Atrial fibrillation ([place])   Long term (current) use of anticoagulants   Hypoxia   Community acquired pneumonia    Discharge Instructions  Discharge Instructions    Diet - low sodium heart healthy   Complete by:  As directed    Discharge instructions   Complete by:  As directed    1)Follow up with your PCP in a week.  Do  CBC, BMP tests during the follow-up. 2)Take prescribed medications as instructed.   Increase activity slowly   Complete by:  As directed      Allergies as of 04/07/2019   No Known Allergies     Medication List    STOP taking these medications   diltiazem 300 MG 24 hr capsule Commonly known as:  CARDIZEM CD     TAKE these medications   acetaminophen 500 MG tablet Commonly known as:  TYLENOL Take 1,000 mg by mouth every 6 (six) hours as needed for mild pain.   amiodarone 200 MG tablet Commonly known as:  PACERONE Take 0.5 tablets (100 mg total) by mouth daily.   b complex vitamins tablet Take 1 tablet by mouth daily.   cefdinir 300 MG capsule Commonly known as:  OMNICEF Take 1 capsule (300 mg total) by mouth 2 (two) times daily.   cycloSPORINE 0.05 % ophthalmic emulsion Commonly known as:  RESTASIS Place 1 drop into both eyes 2 (two) times daily.   ferrous fumarate 325 (106 Fe) MG Tabs tablet Commonly known as:  HEMOCYTE - 106 mg FE Take 1 tablet (106 mg of iron total) by mouth daily.   fluticasone 50 MCG/ACT nasal spray Commonly known as:  FLONASE Place 2 sprays into both nostrils  daily.   gabapentin 300 MG capsule Commonly known as:  NEURONTIN TAKE 1 CAPSULE TWICE A DAY   guaiFENesin 600 MG 12 hr tablet Commonly known as:  MUCINEX Take 1 tablet (600 mg total) by mouth 2 (two) times daily as needed for up to 7 days.   metoprolol succinate 50 MG 24 hr tablet Commonly known as:  TOPROL-XL TAKE 1 TABLET DAILY WITH OR IMMEDIATELY FOLLOWING A MEAL What  changed:  See the new instructions.   multivitamin with minerals Tabs tablet Take 1 tablet by mouth every evening.   potassium chloride SA 20 MEQ tablet Commonly known as:  K-DUR Take 2 tablets (40 mEq total) by mouth daily.   rosuvastatin 5 MG tablet Commonly known as:  CRESTOR TAKE 1 TABLET DAILY   torsemide 20 MG tablet Commonly known as:  DEMADEX Take 2 tablets (40 mg total) by mouth daily. Take extra 20 mg tablet once daily as needed for 3 lbs or more weight gain.   traZODone 100 MG tablet Commonly known as:  DESYREL Take 300 mg by mouth at bedtime.   warfarin 5 MG tablet Commonly known as:  COUMADIN Take as directed. If you are unsure how to take this medication, talk to your nurse or doctor. Original instructions:  TAKE 1 TABLET DAILY AS DIRECTED BY THE COUMADIN CLINIC What changed:  See the new instructions.      Follow-up Information    [name], MD Follow up.   Specialty:  Family Medicine Contact information: [address] [place] [address] [phone]          No Known Allergies  Consultations:  None   Procedures/Studies: Dg Chest 2 View  Result Date: 04/05/2019 CLINICAL DATA:  Pneumonia. Continued surveillance. Patient reports feeling better. EXAM: CHEST - 2 VIEW COMPARISON:  04/01/2019. FINDINGS: Cardiomegaly status post CABG. RIGHT lung opacities are approved. Increasing lung volumes bilaterally. No significant effusion. No pneumothorax. IMPRESSION: Improved aeration. RIGHT lung infiltrates have not yet cleared. Continued surveillance warranted. Electronically Signed   By: Staci Righter M.D.   On: 04/05/2019 13:25   Dg Chest Portable 1 View  Result Date: 04/01/2019 CLINICAL DATA:  Shortness of breath over the last 2 days. EXAM: PORTABLE CHEST 1 VIEW COMPARISON:  03/12/2018 FINDINGS: Artifact overlies the chest. There is been previous median sternotomy and mitral valve replacement. There is chronic cardiomegaly and aortic  atherosclerosis. The left lung shows some chronic scarring at the base but is otherwise clear. There is extensive infiltrate within the right lung consistent with bronchopneumonia. IMPRESSION: Extensive right bronchopneumonia. Electronically Signed   By: Nelson Chimes M.D.   On: 04/01/2019 13:20   Nm Pet (axumin) Skull Base To Mid Thigh  Result Date: 03/30/2019 CLINICAL DATA:  Prostate carcinoma status post radical prostatectomy. Elevated PSA. PSA equal 1.4 on 01/31/2019 EXAM: NUCLEAR MEDICINE PET SKULL BASE TO THIGH TECHNIQUE: 10.1 mCi F-18 Fluciclovine was injected intravenously. Full-ring PET imaging was performed from the skull base to thigh after the radiotracer. CT data was obtained and used for attenuation correction and anatomic localization. COMPARISON:  02/09/2019 CT FINDINGS: NECK No radiotracer activity in neck lymph nodes. Incidental CT finding: None CHEST No radiotracer accumulation within mediastinal or hilar lymph nodes. No suspicious pulmonary nodules on the CT scan. Incidental CT finding: Round atelectasis LEFT lung base. Calcified nodules over the RIGHT hemidiaphragm. Findings not changed from recent CT. Heart enlarged particularly the LEFT atrium. ABDOMEN/PELVIS Prostate: Focal activity in the LEFT aspect of  the prostatectomy bed with SUV max equal 6.7. There is soft tissue thickening positioned between the base of the bladder and the anterior wall of the rectum at this level measuring approximately 2.5 by 1.5 cm (image 196/4). Lymph nodes: No hypermetabolic common iliac, internal iliac or proximal external iliac lymph nodes are present. There is mild radiotracer activity associated with enlarged RIGHT external iliac lymph node measuring 8 mm (image 190/4). Activity is mild SUV max equal 3.3. There is a collection in the RIGHT groin adjacent to vascular clips measuring 2.2 cm (image 200/4) also with mild radiotracer activity (SUV max equal 3.4). Liver: No evidence of liver metastasis  Incidental CT finding: None SKELETON No focal  activity to suggest skeletal metastasis. IMPRESSION: 1. Evidence for local prostate cancer recurrence in the LEFT aspect of the prostatectomy bed position between the base the bladder and rectum. 2. No convincing evidence of metastatic pelvic lymphadenopathy. 3. Mild activity associated with a RIGHT external iliac lymph node and a ill-defined node or collection in the RIGHT groin are favored inflammatory rather than metastatic. 4. No evidence of distant metastatic prostate carcinoma. 5. No evidence skeletal metastasis. Electronically Signed   By: Suzy Bouchard M.D.   On: 03/30/2019 09:43      Subjective: Patient seen and examined the bedside this morning.  Remains comfortable.  Hemodynamically stable.  Saturating fine on room air.  Stable for discharge today.  Discharge Exam: Vitals:   04/06/19 2119 04/07/19 0533  BP: (!) 139/91 104/64  Pulse: 67 68  Resp: 18 18  Temp: 97.9 F (36.6 C) 98.3 F (36.8 C)  SpO2: 95% 93%   Vitals:   04/06/19 1430 04/06/19 1519 04/06/19 2119 04/07/19 0533  BP: 118/69  (!) 139/91 104/64  Pulse: 69  67 68  Resp: 14  18 18   Temp: 97.8 F (36.6 C)  97.9 F (36.6 C) 98.3 F (36.8 C)  TempSrc: Oral  Oral Oral  SpO2: 92% 91% 95% 93%  Weight:      Height:        General: Pt is alert, awake, not in acute distress,elderly gentleman Cardiovascular: RRR, S1/S2 +, no rubs, no gallops Respiratory: CTA bilaterally, no wheezing, no rhonchi Abdominal: Soft, NT, ND, bowel sounds + Extremities: no edema, no cyanosis    The results of significant diagnostics from this hospitalization (including imaging, microbiology, ancillary and laboratory) are listed below for reference.     Microbiology: Recent Results (from the past 240 hour(s))  Blood culture (routine x 2)     Status: None   Collection Time: 04/01/19 12:00 PM  Result Value Ref Range Status   Specimen Description   Final    BLOOD RIGHT  FOREARM Performed at [facility], [place] [address], [place], [place] [address]    Special Requests   Final    BOTTLES DRAWN AEROBIC AND ANAEROBIC Blood Culture adequate volume Performed at [facility], [place] [place], [place] [address]    Culture   Final    NO GROWTH 5 DAYS Performed at [facility], [place] [address], [place], [place] [address]    Report Status 04/06/2019 FINAL  Final  SARS Coronavirus 2 (Hosp order,Performed in [facility] lab via Abbott ID)     Status: None   Collection Time: 04/01/19 12:20 PM  Result Value Ref Range Status   SARS Coronavirus 2 (Abbott ID Now) NEGATIVE NEGATIVE Final    Comment: (NOTE) Interpretive Result Comment(s): COVID 19 Positive  SARS CoV 2 target nucleic acids are DETECTED. The SARS CoV 2 RNA is generally detectable in upper and lower respiratory specimens during the acute phase of infection.  Positive results are indicative of active infection with SARS CoV 2.  Clinical correlation with patient history and other diagnostic information is necessary to determine patient infection status.  Positive results do not rule out bacterial infection or coinfection with other viruses. The expected result is Negative. COVID 19 Negative SARS CoV 2 target nucleic acids are NOT DETECTED. The SARS CoV 2 RNA is generally detectable in upper and lower respiratory specimens during the acute phase of infection.  Negative results do not preclude SARS CoV 2 infection, do not rule out coinfections with other pathogens, and should not be used as the sole basis for treatment or other patient management decisions.  Negative results must be combined with clinical  observations, patient history, and epidemiological information. The expected result is Negative. Invalid Presence or absence of SARS CoV 2 nucleic acids cannot be determined. Repeat testing was performed on the submitted specimen and repeated Invalid results were  obtained.  If clinically indicated, additional testing on a new specimen with an alternate test methodology [phone]) is advised.  The SARS CoV 2 RNA is generally detectable in upper and lower respiratory specimens during the acute phase of infection. The expected result is Negative. Fact Sheet for Patients:  GolfingFamily.no Fact Sheet for Healthcare Providers: https://www.hernandez-brewer.com/ This test is not yet approved or cleared by the [place] FDA and has been authorized for detection and/or diagnosis of SARS CoV 2 by FDA under an Emergency Use Authorization (EUA).  This EUA will remain in effect (meaning this test can be used) for the duration of the COVID19 d eclaration under Section 564(b)(1) of the Act, 21 U.S.C. section [phone] 3(b)(1), unless the authorization is terminated or revoked sooner. Performed at [facility], [address] [place], [place] [address]   Blood culture (routine x 2)     Status: None   Collection Time: 04/01/19  1:30 PM  Result Value Ref Range Status   Specimen Description   Final    BLOOD RIGHT FOREARM Performed at [facility], [place] [address] [place], [place] [address]    Special Requests   Final    BOTTLES DRAWN AEROBIC AND ANAEROBIC Blood Culture adequate volume Performed at [facility], [place] [place], [place] [address]    Culture   Final    NO GROWTH 5 DAYS Performed at [facility], [place] [address], [place], [place] [address]    Report Status 04/06/2019 FINAL  Final  MRSA PCR Screening     Status: Abnormal   Collection Time: 04/01/19  6:20 PM  Result Value Ref Range Status   MRSA by PCR POSITIVE (A) NEGATIVE Final    Comment:        The GeneXpert MRSA Assay (FDA approved for NASAL specimens only), is one component of a comprehensive MRSA colonization surveillance program. It is not intended to diagnose MRSA infection nor to guide or monitor  treatment for MRSA infections. RESULT CALLED TO, READ BACK BY AND VERIFIED WITH: RUDD,B RN @2050  ON 04/01/2019 JACKSON,K Performed at [facility], [place] [address] [place], [place] [address]   SARS Coronavirus 2 (CEPHEID - Performed in [facility] hospital lab), Hosp Order     Status: None   Collection Time: 04/05/19  4:42 PM  Result Value Ref Range  Status   SARS Coronavirus 2 NEGATIVE NEGATIVE Final    Comment: (NOTE) If result is NEGATIVE SARS-CoV-2 target nucleic acids are NOT DETECTED. The SARS-CoV-2 RNA is generally detectable in upper and lower  respiratory specimens during the acute phase of infection. The lowest  concentration of SARS-CoV-2 viral copies this assay can detect is 250  copies / mL. A negative result does not preclude SARS-CoV-2 infection  and should not be used as the sole basis for treatment or other  patient management decisions.  A negative result may occur with  improper specimen collection / handling, submission of specimen other  than nasopharyngeal swab, presence of viral mutation(s) within the  areas targeted by this assay, and inadequate number of viral copies  (<250 copies / mL). A negative result must be combined with clinical  observations, patient history, and epidemiological information. If result is POSITIVE SARS-CoV-2 target nucleic acids are DETECTED. The SARS-CoV-2 RNA is generally detectable in upper and lower  respiratory specimens dur ing the acute phase of infection.  Positive  results are indicative of active infection with SARS-CoV-2.  Clinical  correlation with patient history and other diagnostic information is  necessary to determine patient infection status.  Positive results do  not rule out bacterial infection or co-infection with other viruses. If result is PRESUMPTIVE POSTIVE SARS-CoV-2 nucleic acids MAY BE PRESENT.   A presumptive positive result was obtained on the submitted specimen  and confirmed on repeat  testing.  While 2019 novel coronavirus  (SARS-CoV-2) nucleic acids may be present in the submitted sample  additional confirmatory testing may be necessary for epidemiological  and / or clinical management purposes  to differentiate between  SARS-CoV-2 and other Sarbecovirus currently known to infect humans.  If clinically indicated additional testing with an alternate test  methodology [phone]) is advised. The SARS-CoV-2 RNA is generally  detectable in upper and lower respiratory sp ecimens during the acute  phase of infection. The expected result is Negative. Fact Sheet for Patients:  StrictlyIdeas.no Fact Sheet for Healthcare Providers: BankingDealers.co.za This test is not yet approved or cleared by the [place] FDA and has been authorized for detection and/or diagnosis of SARS-CoV-2 by FDA under an Emergency Use Authorization (EUA).  This EUA will remain in effect (meaning this test can be used) for the duration of the COVID-19 declaration under Section 564(b)(1) of the Act, 21 U.S.C. section 360bbb-3(b)(1), unless the authorization is terminated or revoked sooner. Performed at [facility], [place] [address], [place], [place] [address]      Labs: BNP (last 3 results) Recent Labs    04/01/19 1200 04/05/19 1211  BNP 1,036.9* 419.6*   Basic Metabolic Panel: Recent Labs  Lab 04/03/19 0253 04/03/19 1802 04/04/19 0343 04/05/19 0527 04/06/19 0428 04/07/19 0529  NA 144  --  141 140 140 140  K 3.8  --  4.3 3.9 4.0 3.9  CL 109  --  111 106 106 106  CO2 26  --  25 27 28 28   GLUCOSE 111*  --  97 97 93 91  BUN 25*  --  25* 25* 27* 28*  CREATININE 1.17  --  1.02 1.15 1.04 1.31*  CALCIUM 9.1  --  8.9 8.6* 8.7* 8.6*  MG  --  2.3  --   --   --   --    Liver Function Tests: Recent Labs  Lab 04/01/19 1200  AST 23  ALT 17  ALKPHOS 55  BILITOT 1.3*  PROT  7.2  ALBUMIN 3.9   No results for input(s):  LIPASE, AMYLASE in the last 168 hours. No results for input(s): AMMONIA in the last 168 hours. CBC: Recent Labs  Lab 04/01/19 1200 04/02/19 0309 04/03/19 0253 04/04/19 0343 04/05/19 1210 04/07/19 0529  WBC 23.0* 18.0* 11.2* 7.6 6.7 6.5  NEUTROABS 17.3*  --   --   --  4.5  --   HGB 11.6* 10.5* 10.4* 10.2* 12.3* 11.5*  HCT 39.3 35.0* 35.0* 35.2* 41.4 38.0*  MCV 85.6 88.4 88.2 87.8 88.8 86.2  PLT 60* 41* 42* 50* 68* 79*   Cardiac Enzymes: No results for input(s): CKTOTAL, CKMB, CKMBINDEX, TROPONINI in the last 168 hours. BNP: Invalid input(s): POCBNP CBG: No results for input(s): GLUCAP in the last 168 hours. D-Dimer No results for input(s): DDIMER in the last 72 hours. Hgb A1c No results for input(s): HGBA1C in the last 72 hours. Lipid Profile No results for input(s): CHOL, HDL, LDLCALC, TRIG, CHOLHDL, LDLDIRECT in the last 72 hours. Thyroid function studies No results for input(s): TSH, T4TOTAL, T3FREE, THYROIDAB in the last 72 hours.  Invalid input(s): FREET3 Anemia work up No results for input(s): VITAMINB12, FOLATE, FERRITIN, TIBC, IRON, RETICCTPCT in the last 72 hours. Urinalysis    Component Value Date/Time   COLORURINE YELLOW 11/01/2018 [place] 11/01/2018 1234   LABSPEC 1.010 11/01/2018 1234   PHURINE 5.5 11/01/2018 1234   GLUCOSEU NEGATIVE 11/01/2018 1234   HGBUR NEGATIVE 11/01/2018 [place] 11/01/2018 1234   BILIRUBINUR negative 09/22/2017 [place] 11/01/2018 1234   PROTEINUR negative 09/22/2017 1040   PROTEINUR NEGATIVE 04/26/2017 2259   UROBILINOGEN 0.2 11/01/2018 1234   NITRITE NEGATIVE 11/01/2018 1234   LEUKOCYTESUR NEGATIVE 11/01/2018 1234   Sepsis Labs Invalid input(s): PROCALCITONIN,  WBC,  LACTICIDVEN Microbiology Recent Results (from the past 240 hour(s))  Blood culture (routine x 2)     Status: None   Collection Time: 04/01/19 12:00 PM  Result Value Ref Range Status   Specimen  Description   Final    BLOOD RIGHT FOREARM Performed at [facility], [place] [address], [place], [place] [address]    Special Requests   Final    BOTTLES DRAWN AEROBIC AND ANAEROBIC Blood Culture adequate volume Performed at [facility], [place] [place], [place] [address]    Culture   Final    NO GROWTH 5 DAYS Performed at [name], [place] [address] [place], [place] [address]    Report Status 04/06/2019 FINAL  Final  SARS Coronavirus 2 (Hosp order,Performed in [facility] lab via Abbott ID)     Status: None   Collection Time: 04/01/19 12:20 PM  Result Value Ref Range Status   SARS Coronavirus 2 (Abbott ID Now) NEGATIVE NEGATIVE Final    Comment: (NOTE) Interpretive Result Comment(s): COVID 19 Positive SARS CoV 2 target nucleic acids are DETECTED. The SARS CoV 2 RNA is generally detectable in upper and lower respiratory specimens during the acute phase of infection.  Positive results are indicative of active infection with SARS CoV 2.  Clinical correlation with patient history and other diagnostic information is necessary to determine patient infection status.  Positive results do not rule out bacterial infection or coinfection with other viruses. The expected result is Negative. COVID 19 Negative SARS CoV 2 target nucleic acids are NOT DETECTED. The SARS CoV 2 RNA is generally detectable in upper and lower respiratory specimens during  the acute phase of infection.  Negative results do not preclude SARS CoV 2 infection, do not rule out coinfections with other pathogens, and should not be used as the sole basis for treatment or other patient management decisions.  Negative results must be combined with clinical  observations, patient history, and epidemiological information. The expected result is Negative. Invalid Presence or absence of SARS CoV 2 nucleic acids cannot be determined. Repeat testing was performed on the submitted specimen and  repeated Invalid results were obtained.  If clinically indicated, additional testing on a new specimen with an alternate test methodology [phone]) is advised.  The SARS CoV 2 RNA is generally detectable in upper and lower respiratory specimens during the acute phase of infection. The expected result is Negative. Fact Sheet for Patients:  GolfingFamily.no Fact Sheet for Healthcare Providers: https://www.hernandez-brewer.com/ This test is not yet approved or cleared by the [place] FDA and has been authorized for detection and/or diagnosis of SARS CoV 2 by FDA under an Emergency Use Authorization (EUA).  This EUA will remain in effect (meaning this test can be used) for the duration of the COVID19 d eclaration under Section 564(b)(1) of the Act, 21 U.S.C. section [phone] 3(b)(1), unless the authorization is terminated or revoked sooner. Performed at [facility], [place] [place], [place] [address]   Blood culture (routine x 2)     Status: None   Collection Time: 04/01/19  1:30 PM  Result Value Ref Range Status   Specimen Description   Final    BLOOD RIGHT FOREARM Performed at [facility], [place] [address], [place] [address]    Special Requests   Final    BOTTLES DRAWN AEROBIC AND ANAEROBIC Blood Culture adequate volume Performed at [facility], [place] [place], [place] [address]    Culture   Final    NO GROWTH 5 DAYS Performed at [facility], [place] [address], [place], [place] [address]    Report Status 04/06/2019 FINAL  Final  MRSA PCR Screening     Status: Abnormal   Collection Time: 04/01/19  6:20 PM  Result Value Ref Range Status   MRSA by PCR POSITIVE (A) NEGATIVE Final    Comment:        The GeneXpert MRSA Assay (FDA approved for NASAL specimens only), is one component of a comprehensive MRSA colonization surveillance program. It is not intended to diagnose  MRSA infection nor to guide or monitor treatment for MRSA infections. RESULT CALLED TO, READ BACK BY AND VERIFIED WITH: RUDD,B RN @2050  ON 04/01/2019 JACKSON,K Performed at [facility], [place] [address] [place], [place] [address]   SARS Coronavirus 2 (CEPHEID - Performed in [place] hospital lab), Hosp Order     Status: None   Collection Time: 04/05/19  4:42 PM  Result Value Ref Range Status   SARS Coronavirus 2 NEGATIVE NEGATIVE Final    Comment: (NOTE) If result is NEGATIVE SARS-CoV-2 target nucleic acids are NOT DETECTED. The SARS-CoV-2 RNA is generally detectable in upper and lower  respiratory specimens during the acute phase of infection. The lowest  concentration of SARS-CoV-2 viral copies this assay can detect is 250  copies / mL. A negative result does not preclude SARS-CoV-2 infection  and should not be used as the sole basis for treatment or other  patient management decisions.  A negative result may occur with  improper specimen collection / handling, submission of  specimen other  than nasopharyngeal swab, presence of viral mutation(s) within the  areas targeted by this assay, and inadequate number of viral copies  (<250 copies / mL). A negative result must be combined with clinical  observations, patient history, and epidemiological information. If result is POSITIVE SARS-CoV-2 target nucleic acids are DETECTED. The SARS-CoV-2 RNA is generally detectable in upper and lower  respiratory specimens dur ing the acute phase of infection.  Positive  results are indicative of active infection with SARS-CoV-2.  Clinical  correlation with patient history and other diagnostic information is  necessary to determine patient infection status.  Positive results do  not rule out bacterial infection or co-infection with other viruses. If result is PRESUMPTIVE POSTIVE SARS-CoV-2 nucleic acids MAY BE PRESENT.   A presumptive positive result was obtained on the  submitted specimen  and confirmed on repeat testing.  While 2019 novel coronavirus  (SARS-CoV-2) nucleic acids may be present in the submitted sample  additional confirmatory testing may be necessary for epidemiological  and / or clinical management purposes  to differentiate between  SARS-CoV-2 and other Sarbecovirus currently known to infect humans.  If clinically indicated additional testing with an alternate test  methodology [phone]) is advised. The SARS-CoV-2 RNA is generally  detectable in upper and lower respiratory sp ecimens during the acute  phase of infection. The expected result is Negative. Fact Sheet for Patients:  StrictlyIdeas.no Fact Sheet for Healthcare Providers: BankingDealers.co.za This test is not yet approved or cleared by the [place] FDA and has been authorized for detection and/or diagnosis of SARS-CoV-2 by FDA under an Emergency Use Authorization (EUA).  This EUA will remain in effect (meaning this test can be used) for the duration of the COVID-19 declaration under Section 564(b)(1) of the Act, 21 U.S.C. section 360bbb-3(b)(1), unless the authorization is terminated or revoked sooner. Performed at [facility], [place] [address], [place], [place] [address]     Please note: You were cared for by a hospitalist during your hospital stay. Once you are discharged, your primary care physician will handle any further medical issues. Please note that NO REFILLS for any discharge medications will be authorized once you are discharged, as it is imperative that you return to your primary care physician (or establish a relationship with a primary care physician if you do not have one) for your post hospital discharge needs so that they can reassess your need for medications and monitor your lab values.    Time coordinating discharge: 40 minutes  SIGNED:   Shelly Coss, MD  [facility] 04/07/2019, 10:17 AM Pager [phone]  If 7PM-7AM, please contact night-coverage [email or web address] Password [email or web address]

## 2019-04-07 NOTE — Progress Notes (Signed)
Patient was given discharge instructions with no immediate questions or concerns. The patient will be taken downstairs via wheelchair for discharge.

## 2019-04-08 ENCOUNTER — Telehealth: Payer: Self-pay | Admitting: Family Medicine

## 2019-04-08 NOTE — Telephone Encounter (Signed)
Noted  

## 2019-04-08 NOTE — Telephone Encounter (Signed)
Copied from [place] [id number]. Topic: Quick Communication - Home Health Verbal Orders >> Apr 08, 2019 11:25 AM Gustavus Messing wrote: Caller/Agency: [facility]  The patient wants to delay appointment until 04/10/2019

## 2019-04-13 ENCOUNTER — Telehealth: Payer: Self-pay | Admitting: Family Medicine

## 2019-04-13 NOTE — Addendum Note (Signed)
Addended by: Magdalene Molly A on: 04/13/2019 10:32 AM   Modules accepted: Orders

## 2019-04-13 NOTE — Telephone Encounter (Signed)
Verbal Orders given.

## 2019-04-13 NOTE — Telephone Encounter (Signed)
Copied from [place] [phone]. Topic: Quick Communication - See Telephone Encounter >> Apr 13, 2019  8:42 AM Robina Ade, Helene Kelp D wrote: CRM for notification. See Telephone encounter for: 04/13/19. Cindee Salt with [facility] called to request verbal orders for PT as follow: 2X a week for 4 weeks and his call back number is [phone].

## 2019-04-18 ENCOUNTER — Other Ambulatory Visit: Payer: Self-pay

## 2019-04-18 ENCOUNTER — Telehealth: Payer: Self-pay

## 2019-04-18 ENCOUNTER — Ambulatory Visit (INDEPENDENT_AMBULATORY_CARE_PROVIDER_SITE_OTHER): Payer: Medicare Other | Admitting: Family Medicine

## 2019-04-18 DIAGNOSIS — I10 Essential (primary) hypertension: Secondary | ICD-10-CM | POA: Diagnosis not present

## 2019-04-18 DIAGNOSIS — D649 Anemia, unspecified: Secondary | ICD-10-CM

## 2019-04-18 DIAGNOSIS — Z8546 Personal history of malignant neoplasm of prostate: Secondary | ICD-10-CM

## 2019-04-18 DIAGNOSIS — I5033 Acute on chronic diastolic (congestive) heart failure: Secondary | ICD-10-CM

## 2019-04-18 DIAGNOSIS — N179 Acute kidney failure, unspecified: Secondary | ICD-10-CM

## 2019-04-18 DIAGNOSIS — J189 Pneumonia, unspecified organism: Secondary | ICD-10-CM | POA: Diagnosis not present

## 2019-04-18 NOTE — Progress Notes (Signed)
Virtual Visit via Telephone Note  I connected with [name] [name] on 04/18/19 at  3:20 PM EDT by a phone enabled telemedicine application and verified that I am speaking with the correct person using two identifiers.  Location: Patient: home Provider: office   I discussed the limitations of evaluation and management by telemedicine and the availability of in person appointments. The patient expressed understanding and agreed to proceed. Magdalene Molly, CMA tried to get patient set up on video platform and was unable so completed visit via telephone    Subjective:    Patient ID: [name] [name], male    DOB: [birth year], 85 y.o.   MRN: [id number]  No chief complaint on file.   HPI Patient is in today for hospital follow up. Patient recently hospitalized with pneumonia and feeling much better since returning home. He notes they stopped his cardizem while in the hospital and his bp has been running around 120/80 and he feels better since returning home. He reports his SOB is improving. No acute concerns today. Is managing his quarantine well and he is monitoring his bp and pulse. Denies CP/palp/SOB/HA/congestion/fevers/GI or GU c/o. Taking meds as prescribed  Past Medical History:  Diagnosis Date  . Abnormality of gait 05/27/2016  . Adenomatous polyps   . Carpal tunnel syndrome 06/25/2016   Right  . Depressive disorder, not elsewhere classified   . First degree AV block    Holter 3/18: NSR, PACs, PVCs, no AFib, no pauses.  Marland Kitchen Hereditary and idiopathic peripheral neuropathy 06/25/2016  . Hypertension   . Internal nasal lesion 05/15/2013  . Melanoma ([place])    Left Shoulder  . Mitral regurgitation   . MVP (mitral valve prolapse)    a. With severe MR s/p Complex valvuloplasty including artificial Gore-tex neochord placement x4, chordal transposition x1, chordal release x1, # 32 mm Sorin Memo 3D Ring Annuloplasty 2012. // b. Echo 2/18: mild LVH, EF 50-55, mild AI, MV repair with mild MR, mod  LAE, mod RVE, severe RAE, severe TR  . Neuropathy   . Normal coronary arteries    a. Normal coronary anatomy by cath 2012.  . Osteoarthritis    Knees  . PAF (paroxysmal atrial fibrillation) ([place])    a. Post-op MVR 2012.  Marland Kitchen Personal history of colonic polyps   . Prostate cancer ([place])   . Pulmonary HTN ([place])    a. Mild-mod by cath 2012.  . Pure hypercholesterolemia   . PVC (premature ventricular contraction)   . Thrombocytopenia ([place])   . Vision abnormalities    Cornea scarring    Past Surgical History:  Procedure Laterality Date  . CARDIAC CATHETERIZATION  09/2011   Pre-op for MVR -- normal coronaries.  Marland Kitchen CARDIOVERSION N/A 01/02/2016   Procedure: CARDIOVERSION;  Surgeon: Thayer Headings, MD;  Location: [facility] ENDOSCOPY;  Service: Cardiovascular;  Laterality: N/A;  . COLONOSCOPY W/ POLYPECTOMY    . INGUINAL HERNIA REPAIR  09/2009   Left  . KNEE ARTHROSCOPY      left x3  and right x2  . Melanoma Surgery     2001, 2005, 2006, 2009  . MITRAL VALVE REPAIR  10/01/2011   complex valvuloplasty with Goretex cord replacement and chordal transposition 52mm Sorin Memo 3D ring annuloplasty  . Nuclear Stress Test  09/2006   EF-64%, Normal  . PROSTATECTOMY  1993  . RIGHT HEART CATH N/A 04/29/2017   Procedure: Right Heart Cath;  Surgeon: Jolaine Artist, MD;  Location: [facility] CV LAB;  Service:  Cardiovascular;  Laterality: N/A;  . ROOT CANAL  08-19-12  . ROTATOR CUFF REPAIR  2003   left  . TEE WITHOUT CARDIOVERSION  09/26/2011   Procedure: TRANSESOPHAGEAL ECHOCARDIOGRAM (TEE);  Surgeon: Lelon Perla, MD;  Location: [facility] ENDOSCOPY;  Service: Cardiovascular;  Laterality: N/A;  . US ECHOCARDIOGRAPHY  09/2009, 08/1011   mild LVH,mild AI,MVP with mild MR, mild-mod. TR with mild Pulm. HTN, EF-55-60%    Family History  Problem Relation Age of [place]  . Clotting disorder Brother        CVA's  . Arthritis Mother   . Hypertension Mother   . Stroke Mother   . Hypertension Father   .  Psychosis Father        psychiatric care  . Colon cancer Neg Hx   . Stomach cancer Neg Hx   . Heart attack Neg Hx   . Prostate cancer Neg Hx   . Pancreatic cancer Neg Hx     Social History   Socioeconomic History  . Marital status: Widowed    Spouse name: Not on file  . Number of children: 2  . Years of education: 102  . Highest education level: Not on file  Occupational History    Comment: retired  Scientific laboratory technician  . Financial resource strain: Not on file  . Food insecurity:    Worry: Not on file    Inability: Not on file  . Transportation needs:    Medical: Not on file    Non-medical: Not on file  Tobacco Use  . Smoking status: Never Smoker  . Smokeless tobacco: Never Used  Substance and Sexual Activity  . Alcohol use: No    Alcohol/week: 0.0 standard drinks    Comment: Last drink in 2000  . Drug use: No  . Sexual activity: Not Currently  Lifestyle  . Physical activity:    Days per week: Not on file    Minutes per session: Not on file  . Stress: Not on file  Relationships  . Social connections:    Talks on phone: Not on file    Gets together: Not on file    Attends religious service: Not on file    Active member of club or organization: Not on file    Attends meetings of clubs or organizations: Not on file    Relationship status: Not on file  . Intimate partner violence:    Fear of current or ex partner: Not on file    Emotionally abused: Not on file    Physically abused: Not on file    Forced sexual activity: Not on file  Other Topics Concern  . Not on file  Social History Narrative   Retired - Optometrist   Widower   2 children (one in [place] and on one in [place])    Drinks 1 cup of coffee per day    Outpatient Medications Prior to Visit  Medication Sig Dispense Refill  . acetaminophen (TYLENOL) 500 MG tablet Take 1,000 mg by mouth every 6 (six) hours as needed for mild pain.    Marland Kitchen amiodarone (PACERONE) 200 MG tablet Take 0.5 tablets (100 mg total)  by mouth daily. 15 tablet 12  . b complex vitamins tablet Take 1 tablet by mouth daily.    . cefdinir (OMNICEF) 300 MG capsule Take 1 capsule (300 mg total) by mouth 2 (two) times daily. 2 capsule 0  . cycloSPORINE (RESTASIS) 0.05 % ophthalmic emulsion Place 1 drop into both eyes 2 (two)  times daily.    . ferrous fumarate (HEMOCYTE - 106 MG FE) 325 (106 Fe) MG TABS tablet Take 1 tablet (106 mg of iron total) by mouth daily. 30 each 3  . fluticasone (FLONASE) 50 MCG/ACT nasal spray Place 2 sprays into both nostrils daily.   11  . gabapentin (NEURONTIN) 300 MG capsule TAKE 1 CAPSULE TWICE A DAY (Patient taking differently: Take 300 mg by mouth 2 (two) times daily. ) 180 capsule 4  . metoprolol succinate (TOPROL-XL) 50 MG 24 hr tablet TAKE 1 TABLET DAILY WITH OR IMMEDIATELY FOLLOWING A MEAL (Patient taking differently: Take 50 mg by mouth daily. ) 90 tablet 4  . Multiple Vitamin (MULTIVITAMIN WITH MINERALS) TABS tablet Take 1 tablet by mouth every evening.     . potassium chloride SA (K-DUR,KLOR-CON) 20 MEQ tablet Take 2 tablets (40 mEq total) by mouth daily. 180 tablet 3  . rosuvastatin (CRESTOR) 5 MG tablet TAKE 1 TABLET DAILY (Patient taking differently: Take 5 mg by mouth daily. ) 90 tablet 1  . torsemide (DEMADEX) 20 MG tablet Take 2 tablets (40 mg total) by mouth daily. Take extra 20 mg tablet once daily as needed for 3 lbs or more weight gain. 180 tablet 3  . traZODone (DESYREL) 100 MG tablet Take 300 mg by mouth at bedtime.     Marland Kitchen warfarin (COUMADIN) 5 MG tablet TAKE 1 TABLET DAILY AS DIRECTED BY THE COUMADIN CLINIC (Patient taking differently: Take 2.5-5 mg by mouth daily. Take 5 mg by mouth every Saturday and 2.5 mg all other days) 60 tablet 1   No facility-administered medications prior to visit.     No Known Allergies  Review of Systems  Constitutional: Positive for malaise/fatigue. Negative for fever.  HENT: Negative for congestion.   Eyes: Negative for blurred vision.  Respiratory:  Positive for shortness of breath.   Cardiovascular: Negative for chest pain, palpitations and leg swelling.  Gastrointestinal: Negative for abdominal pain, blood in stool and nausea.  Genitourinary: Negative for dysuria and frequency.  Musculoskeletal: Negative for falls.  Skin: Negative for rash.  Neurological: Negative for dizziness, loss of consciousness and headaches.  Endo/Heme/Allergies: Negative for environmental allergies.  Psychiatric/Behavioral: Negative for depression. The patient is not nervous/anxious.        Objective:    Physical Exam unable to complete via telephone  There were no vitals taken for this visit. Wt Readings from Last 3 Encounters:  04/03/19 178 lb 2.1 oz (80.8 kg)  03/15/19 173 lb (78.5 kg)  03/11/19 173 lb (78.5 kg)    Diabetic Foot Exam - Simple   No data filed     Lab Results  Component Value Date   WBC 6.5 04/07/2019   HGB 11.5 (L) 04/07/2019   HCT 38.0 (L) 04/07/2019   PLT 79 (L) 04/07/2019   GLUCOSE 91 04/07/2019   CHOL 94 03/15/2019   TRIG 69.0 03/15/2019   HDL 38.90 (L) 03/15/2019   LDLCALC 41 03/15/2019   ALT 17 04/01/2019   AST 23 04/01/2019   NA 140 04/07/2019   K 3.9 04/07/2019   CL 106 04/07/2019   CREATININE 1.31 (H) 04/07/2019   BUN 28 (H) 04/07/2019   CO2 28 04/07/2019   TSH 1.57 03/15/2019   PSA 0.07 (L) 09/17/2010   INR 1.8 (H) 04/07/2019   HGBA1C 6.0 (H) 04/03/2014    Lab Results  Component Value Date   TSH 1.57 03/15/2019   Lab Results  Component Value Date   WBC 6.5 04/07/2019  HGB 11.5 (L) 04/07/2019   HCT 38.0 (L) 04/07/2019   MCV 86.2 04/07/2019   PLT 79 (L) 04/07/2019   Lab Results  Component Value Date   NA 140 04/07/2019   K 3.9 04/07/2019   CO2 28 04/07/2019   GLUCOSE 91 04/07/2019   BUN 28 (H) 04/07/2019   CREATININE 1.31 (H) 04/07/2019   BILITOT 1.3 (H) 04/01/2019   ALKPHOS 55 04/01/2019   AST 23 04/01/2019   ALT 17 04/01/2019   PROT 7.2 04/01/2019   ALBUMIN 3.9 04/01/2019    CALCIUM 8.6 (L) 04/07/2019   ANIONGAP 6 04/07/2019   GFR 45.78 (L) 03/15/2019   Lab Results  Component Value Date   CHOL 94 03/15/2019   Lab Results  Component Value Date   HDL 38.90 (L) 03/15/2019   Lab Results  Component Value Date   LDLCALC 41 03/15/2019   Lab Results  Component Value Date   TRIG 69.0 03/15/2019   Lab Results  Component Value Date   CHOLHDL 2 03/15/2019   Lab Results  Component Value Date   HGBA1C 6.0 (H) 04/03/2014       Assessment & Plan:   Problem List Items Addressed This Visit    HYPERTENSION, BENIGN ESSENTIAL    Encouraged weekly check of vitals and report any concerns. no changes to meds. Encouraged heart healthy diet such as the DASH diet and exercise as tolerated.       H/O prostate cancer    S/p prostatectomy. Now with rising PSA. He is following with oncology and agrees to contact urology for further consideraiton      Acute renal failure ([place])    Repeat lab work to include CMP, hydrate well      Pneumonia    Patient recently hospitalized with pneumonia and feeling much better since returning home. Repeat chest xray to assess resolution later this month      Acute on chronic diastolic (congestive) heart failure ([place])    Recently hospitalized with pneumonia and was noted to have elevated BNP, will repeat BNP with labs this week         I am having Meta Hatchet. Ryden maintain his traZODone, multivitamin with minerals, cycloSPORINE, potassium chloride SA, torsemide, fluticasone, warfarin, amiodarone, rosuvastatin, gabapentin, metoprolol succinate, ferrous fumarate, b complex vitamins, acetaminophen, and cefdinir.  No orders of the defined types were placed in this encounter.    Penni Homans, MD I discussed the assessment and treatment plan with the patient. The patient was provided an opportunity to ask questions and all were answered. The patient agreed with the plan and demonstrated an understanding of the instructions.    The patient was advised to call back or seek an in-person evaluation if the symptoms worsen or if the condition fails to improve as anticipated.  I provided 25 minutes of non-face-to-face time during this encounter.   Penni Homans, MD

## 2019-04-18 NOTE — Assessment & Plan Note (Signed)
Recently hospitalized with pneumonia and was noted to have elevated BNP, will repeat BNP with labs this week

## 2019-04-18 NOTE — Assessment & Plan Note (Signed)
Patient recently hospitalized with pneumonia and feeling much better since returning home. Repeat chest xray to assess resolution later this month

## 2019-04-18 NOTE — Assessment & Plan Note (Signed)
Repeat lab work to include CMP, hydrate well

## 2019-04-18 NOTE — Assessment & Plan Note (Signed)
S/p prostatectomy. Now with rising PSA. He is following with oncology and agrees to contact urology for further consideraiton

## 2019-04-18 NOTE — Telephone Encounter (Signed)
lmom for prescreen  

## 2019-04-18 NOTE — Assessment & Plan Note (Signed)
Encouraged weekly check of vitals and report any concerns. no changes to meds. Encouraged heart healthy diet such as the DASH diet and exercise as tolerated.

## 2019-04-19 ENCOUNTER — Other Ambulatory Visit: Payer: Self-pay | Admitting: Cardiology

## 2019-04-19 NOTE — Addendum Note (Signed)
Addended by: Magdalene Molly A on: 04/19/2019 09:16 AM   Modules accepted: Orders

## 2019-04-20 ENCOUNTER — Other Ambulatory Visit: Payer: Self-pay

## 2019-04-20 ENCOUNTER — Ambulatory Visit (INDEPENDENT_AMBULATORY_CARE_PROVIDER_SITE_OTHER): Payer: Medicare Other | Admitting: *Deleted

## 2019-04-20 DIAGNOSIS — I4891 Unspecified atrial fibrillation: Secondary | ICD-10-CM | POA: Diagnosis not present

## 2019-04-20 DIAGNOSIS — Z5181 Encounter for therapeutic drug level monitoring: Secondary | ICD-10-CM | POA: Diagnosis not present

## 2019-04-20 LAB — POCT INR: INR: 2.2 (ref 2.0–3.0)

## 2019-04-20 NOTE — Patient Instructions (Signed)
Description   Continue taking 1/2 tablet daily except 1 tablet on Saturdays.  Recheck in 4 weeks. Call [phone] if scheduled for any procedures or on any new medications

## 2019-04-26 ENCOUNTER — Other Ambulatory Visit: Payer: Self-pay

## 2019-04-26 ENCOUNTER — Encounter: Payer: Self-pay | Admitting: Medical Oncology

## 2019-04-26 ENCOUNTER — Other Ambulatory Visit (INDEPENDENT_AMBULATORY_CARE_PROVIDER_SITE_OTHER): Payer: Medicare Other

## 2019-04-26 DIAGNOSIS — D649 Anemia, unspecified: Secondary | ICD-10-CM | POA: Diagnosis not present

## 2019-04-26 DIAGNOSIS — J189 Pneumonia, unspecified organism: Secondary | ICD-10-CM | POA: Diagnosis not present

## 2019-04-26 LAB — BRAIN NATRIURETIC PEPTIDE: Pro B Natriuretic peptide (BNP): 444 pg/mL — ABNORMAL HIGH (ref 0.0–100.0)

## 2019-04-27 LAB — CBC WITH DIFFERENTIAL/PLATELET
Basophils Absolute: 0.1 10*3/uL (ref 0.0–0.1)
Basophils Relative: 0.8 % (ref 0.0–3.0)
Eosinophils Absolute: 0 10*3/uL (ref 0.0–0.7)
Eosinophils Relative: 0.6 % (ref 0.0–5.0)
HCT: 37 % — ABNORMAL LOW (ref 39.0–52.0)
Hemoglobin: 12 g/dL — ABNORMAL LOW (ref 13.0–17.0)
Lymphocytes Relative: 23.2 % (ref 12.0–46.0)
Lymphs Abs: 1.6 10*3/uL (ref 0.7–4.0)
MCHC: 32.4 g/dL (ref 30.0–36.0)
MCV: 83.3 fl (ref 78.0–100.0)
Monocytes Absolute: 1.3 10*3/uL — ABNORMAL HIGH (ref 0.1–1.0)
Monocytes Relative: 18.7 % — ABNORMAL HIGH (ref 3.0–12.0)
Neutro Abs: 3.8 10*3/uL (ref 1.4–7.7)
Neutrophils Relative %: 56.7 % (ref 43.0–77.0)
Platelets: 75 10*3/uL — ABNORMAL LOW (ref 150.0–400.0)
RBC: 4.44 Mil/uL (ref 4.22–5.81)
RDW: 19.8 % — ABNORMAL HIGH (ref 11.5–15.5)
WBC: 6.8 10*3/uL (ref 4.0–10.5)

## 2019-04-27 LAB — COMPREHENSIVE METABOLIC PANEL
ALT: 13 U/L (ref 0–53)
AST: 19 U/L (ref 0–37)
Albumin: 3.9 g/dL (ref 3.5–5.2)
Alkaline Phosphatase: 52 U/L (ref 39–117)
BUN: 26 mg/dL — ABNORMAL HIGH (ref 6–23)
CO2: 30 mEq/L (ref 19–32)
Calcium: 9.3 mg/dL (ref 8.4–10.5)
Chloride: 103 mEq/L (ref 96–112)
Creatinine, Ser: 1.32 mg/dL (ref 0.40–1.50)
GFR: 51.42 mL/min — ABNORMAL LOW (ref >60.00)
Glucose, Bld: 99 mg/dL (ref 70–99)
Potassium: 4.4 mEq/L (ref 3.5–5.1)
Sodium: 140 mEq/L (ref 135–145)
Total Bilirubin: 0.8 mg/dL (ref 0.2–1.2)
Total Protein: 6.6 g/dL (ref 6.0–8.3)

## 2019-04-27 LAB — FERRITIN: Ferritin: 37.9 ng/mL (ref 22.0–322.0)

## 2019-05-02 ENCOUNTER — Other Ambulatory Visit: Payer: Self-pay | Admitting: Interventional Cardiology

## 2019-05-04 ENCOUNTER — Other Ambulatory Visit: Payer: Self-pay

## 2019-05-04 ENCOUNTER — Ambulatory Visit (HOSPITAL_COMMUNITY)
Admission: RE | Admit: 2019-05-04 | Discharge: 2019-05-04 | Disposition: A | Discharge status: Home / Self Care | Payer: Medicare Other | Source: Ambulatory Visit | Attending: Internal Medicine | Admitting: Internal Medicine

## 2019-05-04 DIAGNOSIS — I071 Rheumatic tricuspid insufficiency: Secondary | ICD-10-CM | POA: Diagnosis not present

## 2019-05-04 DIAGNOSIS — I480 Paroxysmal atrial fibrillation: Secondary | ICD-10-CM | POA: Diagnosis not present

## 2019-05-04 DIAGNOSIS — I5032 Chronic diastolic (congestive) heart failure: Secondary | ICD-10-CM

## 2019-05-04 NOTE — Addendum Note (Signed)
Encounter addended by: Valeda Malm, RN on: 05/04/2019 11:42 AM  Actions taken: Clinical Note Signed

## 2019-05-04 NOTE — Progress Notes (Signed)
AVS reviewed with patient. Verbalized understanding. AVS mailed  AVS: TAKE Torsemide 40mg  (2 tabs) daily (NOT 20mg  twice a day)  Your physician recommends that you schedule a follow-up appointment in: 2-3 months with Dr. Haroldine Laws

## 2019-05-04 NOTE — Progress Notes (Signed)
Heart Failure TeleHealth Note  Due to national recommendations of social distancing due to [place] 19, Audio/video telehealth visit is felt to be most appropriate for this patient at this time.  See MyChart message from today for patient consent regarding telehealth for [name] [name], [name] [name].  Date:  05/04/2019   ID:  [name] [name], DOB 1931-07-03, MRN [id number]  Location: Home  Provider location: [facility] Type of Visit: Established patient  PCP:  [name], MD  Cardiologist:  No primary care provider on file. Primary HF: Durwin Davisson  Chief Complaint: Heart Failure follow-up   History of Present Illness:  [name] [name] ([name] [name]) is an 85 y.o. male with history of diastolic HF, PAF/Aflutter on coumadin, and h/o severe MV prolapse s/p MV repair 10/01/11.  He was admitted 04/26/17-04/30/17 with a fall, no syncope. Also with right sided heart failure. RHC showed normal cardiac output. No evidence of PAH. Diuresed with IV lasix, weight down about 10 pounds. Discharge weight was 190 pounds. He was discharged to SNF.   Admitted in 5/20 for RLL PNA. Also had some HF and diuresed 11 pounds. D/c weight was 162.   Today he presents via audio/video conferencing for a telehealth visit in setting of [place] pandemic. Says he is feeling ok. Good days and bad days. Weight up 3 pounds since [name] d/c. Weight around 165. Still some swelling in feet. Taking torsemide 20 bid. Cardizem stopped due to low BP. No dizziness. No orthopnea or PND.  Echo 5/20: EF 55-60% RV normal. RVSP 35 moderate to severe TR.   ECHO 09/04/2017  LVEF 55-60%  Severe TR, RV moderately to severely dilated. Flattened septum  Personally reviewed Echo 12/19/16 with LVEF 50-55%, Mild AI, s/p MVR with mild residual MR, Mod LAE, Mod RV dilation, severe RAE, Severe TR.  RHC  RA = 8 RV = 36/7 PA = 33/4 (15) PCW = 6 Fick cardiac output/index = 7.7/3.6 PVR = 1.2 WU Ao sat = 99% PA sat = 69%,  72% SVC sat = 69%      [name] [name] denies symptoms worrisome for COVID 19.   Past Medical History:  Diagnosis Date  . Abnormality of gait 05/27/2016  . Adenomatous polyps   . Carpal tunnel syndrome 06/25/2016   Right  . Depressive disorder, not elsewhere classified   . First degree AV block    Holter 3/18: NSR, PACs, PVCs, no AFib, no pauses.  Marland Kitchen Hereditary and idiopathic peripheral neuropathy 06/25/2016  . Hypertension   . Internal nasal lesion 05/15/2013  . Melanoma ([place])    Left Shoulder  . Mitral regurgitation   . MVP (mitral valve prolapse)    a. With severe MR s/p Complex valvuloplasty including artificial Gore-tex neochord placement x4, chordal transposition x1, chordal release x1, # 32 mm Sorin Memo 3D Ring Annuloplasty 2012. // b. Echo 2/18: mild LVH, EF 50-55, mild AI, MV repair with mild MR, mod LAE, mod RVE, severe RAE, severe TR  . Neuropathy   . Normal coronary arteries    a. Normal coronary anatomy by cath 2012.  . Osteoarthritis    Knees  . PAF (paroxysmal atrial fibrillation) ([place])    a. Post-op MVR 2012.  Marland Kitchen Personal history of colonic polyps   . Prostate cancer ([place])   . Pulmonary HTN ([place])    a. Mild-mod by cath 2012.  . Pure hypercholesterolemia   . PVC (premature ventricular contraction)   . Thrombocytopenia ([place])   .  Vision abnormalities    Cornea scarring   Past Surgical History:  Procedure Laterality Date  . CARDIAC CATHETERIZATION  09/2011   Pre-op for MVR -- normal coronaries.  Marland Kitchen CARDIOVERSION N/A 01/02/2016   Procedure: CARDIOVERSION;  Surgeon: Thayer Headings, MD;  Location: [facility] ENDOSCOPY;  Service: Cardiovascular;  Laterality: N/A;  . COLONOSCOPY W/ POLYPECTOMY    . INGUINAL HERNIA REPAIR  09/2009   Left  . KNEE ARTHROSCOPY      left x3  and right x2  . Melanoma Surgery     2001, 2005, 2006, 2009  . MITRAL VALVE REPAIR  10/01/2011   complex valvuloplasty with Goretex cord replacement and chordal transposition 65mm Sorin Memo 3D ring  annuloplasty  . Nuclear Stress Test  09/2006   EF-64%, Normal  . PROSTATECTOMY  1993  . RIGHT HEART CATH N/A 04/29/2017   Procedure: Right Heart Cath;  Surgeon: Jolaine Artist, MD;  Location: [facility] CV LAB;  Service: Cardiovascular;  Laterality: N/A;  . ROOT CANAL  08-19-12  . ROTATOR CUFF REPAIR  2003   left  . TEE WITHOUT CARDIOVERSION  09/26/2011   Procedure: TRANSESOPHAGEAL ECHOCARDIOGRAM (TEE);  Surgeon: Lelon Perla, MD;  Location: [name] ENDOSCOPY;  Service: Cardiovascular;  Laterality: N/A;  . US ECHOCARDIOGRAPHY  09/2009, 08/1011   mild LVH,mild AI,MVP with mild MR, mild-mod. TR with mild Pulm. HTN, EF-55-60%     Current Outpatient Medications  Medication Sig Dispense Refill  . acetaminophen (TYLENOL) 500 MG tablet Take 1,000 mg by mouth every 6 (six) hours as needed for mild pain.    Marland Kitchen amiodarone (PACERONE) 200 MG tablet Take 0.5 tablets (100 mg total) by mouth daily. 15 tablet 12  . b complex vitamins tablet Take 1 tablet by mouth daily.    . cefdinir (OMNICEF) 300 MG capsule Take 1 capsule (300 mg total) by mouth 2 (two) times daily. 2 capsule 0  . cycloSPORINE (RESTASIS) 0.05 % ophthalmic emulsion Place 1 drop into both eyes 2 (two) times daily.    . ferrous fumarate (HEMOCYTE - 106 MG FE) 325 (106 Fe) MG TABS tablet Take 1 tablet (106 mg of iron total) by mouth daily. 30 each 3  . fluticasone (FLONASE) 50 MCG/ACT nasal spray Place 2 sprays into both nostrils daily.   11  . gabapentin (NEURONTIN) 300 MG capsule TAKE 1 CAPSULE TWICE A DAY (Patient taking differently: Take 300 mg by mouth 2 (two) times daily. ) 180 capsule 4  . metoprolol succinate (TOPROL-XL) 50 MG 24 hr tablet TAKE 1 TABLET DAILY WITH OR IMMEDIATELY FOLLOWING A MEAL (Patient taking differently: Take 50 mg by mouth daily. ) 90 tablet 4  . Multiple Vitamin (MULTIVITAMIN WITH MINERALS) TABS tablet Take 1 tablet by mouth every evening.     . potassium chloride SA (K-DUR,KLOR-CON) 20 MEQ tablet Take 2  tablets (40 mEq total) by mouth daily. 180 tablet 3  . rosuvastatin (CRESTOR) 5 MG tablet TAKE 1 TABLET DAILY 90 tablet 3  . torsemide (DEMADEX) 20 MG tablet Take 2 tablets (40 mg total) by mouth daily. Take extra 20 mg tablet once daily as needed for 3 lbs or more weight gain. 180 tablet 3  . traZODone (DESYREL) 100 MG tablet Take 300 mg by mouth at bedtime.     Marland Kitchen warfarin (COUMADIN) 5 MG tablet TAKE 1 TABLET DAILY AS DIRECTED BY THE COUMADIN CLINIC 90 tablet 0   No current facility-administered medications for this encounter.     Allergies:  Patient has no known allergies.   Social History:  The patient  reports that he has never smoked. He has never used smokeless tobacco. He reports that he does not drink alcohol or use drugs.   Family History:  The patient's family history includes Arthritis in his mother; Clotting disorder in his brother; Hypertension in his father and mother; Psychosis in his father; Stroke in his mother.   ROS:  Please see the history of present illness.   All other systems are personally reviewed and negative.   Exam:  (Video/Tele Health Call; Exam is subjective and or/visual.) General:  Speaks in full sentences. No resp difficulty. Lungs: Normal respiratory effort with conversation.  Abdomen: Non-distended per patient report Extremities: Mild pedal edema. Neuro: Alert & oriented x 3.   Recent Labs: 03/15/2019: TSH 1.57 04/03/2019: Magnesium 2.3 04/05/2019: B Natriuretic Peptide 607.8 04/26/2019: ALT 13; BUN 26; Creatinine, Ser 1.32; Hemoglobin 12.0; Platelets 75.0; Potassium 4.4; Pro B Natriuretic peptide (BNP) 444.0; Sodium 140  Personally reviewed   Wt Readings from Last 3 Encounters:  04/03/19 80.8 kg (178 lb 2.1 oz)  03/15/19 78.5 kg (173 lb)  03/11/19 78.5 kg (173 lb)      ASSESSMENT AND PLAN:  1. Combined diastolic HF with R>L symptoms in the setting of longstanding MV disease.  - Stable NYHA II. Volume status seems mildly elevated.  - Instructed  him to take torsemide 40 daily instead of 20 bid (has been missing some evening doses and I think he probably needs higher dose in am to be effective) - Creatinine and lytes stable on 6/9  - Continue Toprol XL 25 mg daily. - Consider PYP scan to evaluate for TTR amyloid.   - RTC in 2 months to evaluate.    2. PAF - Remains in NSR on amio - Continue warfarin for anticoagulation. No bleeding. Will not switch to DOAC with h/o MV repair  3. Severe TR - No change  - Not candidate for TVR due to age and comorbidities. Volume status well controlled with medical therapy.   4. [place] - RHC 6/18 without severe PAH.  (RA 8, PA 33/4 (15), CI 3.6)  5. Severe MVP S/P Bioprosthetic MVR  - Stable. - We discussed continue need for SBE prophylaxis  6. Marked 1AVB - Toprol recently cut back. Will cut amio back - Follow closely for possible PPM need.   7. Recurrent prostate CA - PSA up. Bone scan negative. - Urology contemplating Lupron. Ok from our standpoint.   COVID screen The patient does not have any symptoms that suggest any further testing/ screening at this time.  Social distancing reinforced today.  Recommended follow-up:  As above  Relevant cardiac medications were reviewed at length with the patient today.   The patient does not have concerns regarding their medications at this time.   The following changes were made today:  As above  Today, I have spent 18 minutes with the patient with telehealth technology discussing the above issues .    Signed, [name], MD  05/04/2019 11:12 AM  [facility] [place] [address] [place] [address] [phone] (office) [phone] (fax)

## 2019-05-04 NOTE — Patient Instructions (Signed)
TAKE Torsemide 40mg  (2 tabs) daily (NOT 20mg  twice a day)  Your physician recommends that you schedule a follow-up appointment in: 2-3 months with Dr. Haroldine Laws

## 2019-05-11 ENCOUNTER — Telehealth: Payer: Self-pay

## 2019-05-11 ENCOUNTER — Ambulatory Visit (HOSPITAL_BASED_OUTPATIENT_CLINIC_OR_DEPARTMENT_OTHER)
Admission: RE | Admit: 2019-05-11 | Discharge: 2019-05-11 | Disposition: A | Discharge status: Home / Self Care | Payer: Medicare Other | Source: Ambulatory Visit | Attending: Family Medicine | Admitting: Family Medicine

## 2019-05-11 ENCOUNTER — Other Ambulatory Visit: Payer: Self-pay

## 2019-05-11 DIAGNOSIS — J189 Pneumonia, unspecified organism: Secondary | ICD-10-CM | POA: Diagnosis not present

## 2019-05-11 NOTE — Telephone Encounter (Signed)

## 2019-05-14 ENCOUNTER — Other Ambulatory Visit (HOSPITAL_COMMUNITY): Payer: Self-pay | Admitting: Internal Medicine

## 2019-05-18 ENCOUNTER — Ambulatory Visit (INDEPENDENT_AMBULATORY_CARE_PROVIDER_SITE_OTHER): Payer: Medicare Other | Admitting: *Deleted

## 2019-05-18 ENCOUNTER — Other Ambulatory Visit: Payer: Self-pay

## 2019-05-18 DIAGNOSIS — I4891 Unspecified atrial fibrillation: Secondary | ICD-10-CM

## 2019-05-18 DIAGNOSIS — Z5181 Encounter for therapeutic drug level monitoring: Secondary | ICD-10-CM | POA: Diagnosis not present

## 2019-05-18 LAB — POCT INR: INR: 1.8 — AB (ref 2.0–3.0)

## 2019-05-18 NOTE — Patient Instructions (Signed)
Description   Today take 1.5 tablets then continue taking 1/2 tablet daily except 1 tablet on Saturdays.  Recheck in 3 weeks. Call [phone] if scheduled for any procedures or on any new medications

## 2019-05-19 NOTE — Progress Notes (Signed)
Virtual Visit via Video Note  I connected with patient on 05/23/19 at 11:00 AM EDT by audio enabled telemedicine application and verified that I am speaking with the correct person using two identifiers.   THIS ENCOUNTER IS A VIRTUAL VISIT DUE TO COVID-19 - PATIENT WAS NOT SEEN IN THE OFFICE. PATIENT HAS CONSENTED TO VIRTUAL VISIT / TELEMEDICINE VISIT   Location of patient: home  Location of provider: office  I discussed the limitations of evaluation and management by telemedicine and the availability of in person appointments. The patient expressed understanding and agreed to proceed.   Subjective:   [name] [name] is a 85 y.o. male who presents for Medicare Annual/Subsequent preventive examination.  Review of Systems: No ROS.  Medicare Wellness Virtual Visit.  Visual/audio telehealth visit, UTA vital signs.   See social history for additional risk factors. Cardiac Risk Factors include: advanced age (>69men, >86 women);hypertension;male gender Sleep patterns:  Trazodone at bedtime. Urinal at bedside. Home Safety/Smoke Alarms: Feels safe in home. Smoke alarms in place.  Has caregiver 9 hrs per day. Electric lift chair. Walk in shower. Uses walker.  PT at home 2x/week.  Male:       PSA-  Lab Results  Component Value Date   PSA 0.07 (L) 09/17/2010            Objective:     Advanced Directives 05/23/2019 04/01/2019 04/01/2019 09/14/2018 05/18/2018 11/16/2017 04/27/2017  Does Patient Have a Medical Advance Directive? Yes Yes Yes Yes Yes Yes Yes  Type of Paramedic of [place];Living will - Healthcare Power of [place];Living will [place];Living will [place];Living will Living will;Healthcare Power of Attorney  Does patient want to make changes to medical advance directive? No - Patient declined No - Patient declined - - No - Patient declined - No - Patient declined  Copy of [place] in Chart? Yes - validated most recent copy scanned in chart (See row information) - - No - copy requested Yes - No - copy requested  Pre-existing out of facility DNR order (yellow form or pink MOST form) - - - - - - -    Tobacco Social History   Tobacco Use  Smoking Status Never Smoker  Smokeless Tobacco Never Used     Counseling given: Not Answered   Clinical Intake: Pain : No/denies pain    Past Medical History:  Diagnosis Date  . Abnormality of gait 05/27/2016  . Adenomatous polyps   . Carpal tunnel syndrome 06/25/2016   Right  . Depressive disorder, not elsewhere classified   . First degree AV block    Holter 3/18: NSR, PACs, PVCs, no AFib, no pauses.  Marland Kitchen Hereditary and idiopathic peripheral neuropathy 06/25/2016  . Hypertension   . Internal nasal lesion 05/15/2013  . Melanoma ([place])    Left Shoulder  . Mitral regurgitation   . MVP (mitral valve prolapse)    a. With severe MR s/p Complex valvuloplasty including artificial Gore-tex neochord placement x4, chordal transposition x1, chordal release x1, # 32 mm Sorin Memo 3D Ring Annuloplasty 2012. // b. Echo 2/18: mild LVH, EF 50-55, mild AI, MV repair with mild MR, mod LAE, mod RVE, severe RAE, severe TR  . Neuropathy   . Normal coronary arteries    a. Normal coronary anatomy by cath 2012.  . Osteoarthritis    Knees  . PAF (paroxysmal atrial fibrillation) ([place])    a. Post-op MVR 2012.  Marland Kitchen  Personal history of colonic polyps   . Prostate cancer ([place])   . Pulmonary HTN ([place])    a. Mild-mod by cath 2012.  . Pure hypercholesterolemia   . PVC (premature ventricular contraction)   . Thrombocytopenia ([place])   . Vision abnormalities    Cornea scarring   Past Surgical History:  Procedure Laterality Date  . CARDIAC CATHETERIZATION  09/2011   Pre-op for MVR -- normal coronaries.  Marland Kitchen CARDIOVERSION N/A 01/02/2016   Procedure: CARDIOVERSION;  Surgeon: Thayer Headings, MD;  Location: [facility] ENDOSCOPY;  Service: Cardiovascular;   Laterality: N/A;  . COLONOSCOPY W/ POLYPECTOMY    . INGUINAL HERNIA REPAIR  09/2009   Left  . KNEE ARTHROSCOPY      left x3  and right x2  . Melanoma Surgery     2001, 2005, 2006, 2009  . MITRAL VALVE REPAIR  10/01/2011   complex valvuloplasty with Goretex cord replacement and chordal transposition 19mm Sorin Memo 3D ring annuloplasty  . Nuclear Stress Test  09/2006   EF-64%, Normal  . PROSTATECTOMY  1993  . RIGHT HEART CATH N/A 04/29/2017   Procedure: Right Heart Cath;  Surgeon: Jolaine Artist, MD;  Location: [facility] CV LAB;  Service: Cardiovascular;  Laterality: N/A;  . ROOT CANAL  08-19-12  . ROTATOR CUFF REPAIR  2003   left  . TEE WITHOUT CARDIOVERSION  09/26/2011   Procedure: TRANSESOPHAGEAL ECHOCARDIOGRAM (TEE);  Surgeon: Lelon Perla, MD;  Location: [facility] ENDOSCOPY;  Service: Cardiovascular;  Laterality: N/A;  . US ECHOCARDIOGRAPHY  09/2009, 08/1011   mild LVH,mild AI,MVP with mild MR, mild-mod. TR with mild Pulm. HTN, EF-55-60%   Family History  Problem Relation Age of Onset  . Clotting disorder Brother        CVA's  . Arthritis Mother   . Hypertension Mother   . Stroke Mother   . Hypertension Father   . Psychosis Father        psychiatric care  . Colon cancer Neg Hx   . Stomach cancer Neg Hx   . Heart attack Neg Hx   . Prostate cancer Neg Hx   . Pancreatic cancer Neg Hx    Social History   Socioeconomic History  . Marital status: Widowed    Spouse name: Not on file  . Number of children: 2  . Years of education: 58  . Highest education level: Not on file  Occupational History    Comment: retired  Scientific laboratory technician  . Financial resource strain: Not on file  . Food insecurity    Worry: Not on file    Inability: Not on file  . Transportation needs    Medical: Not on file    Non-medical: Not on file  Tobacco Use  . Smoking status: Never Smoker  . Smokeless tobacco: Never Used  Substance and Sexual Activity  . Alcohol use: No    Alcohol/week: 0.0  standard drinks    Comment: Last drink in 2000  . Drug use: No  . Sexual activity: Not Currently  Lifestyle  . Physical activity    Days per week: Not on file    Minutes per session: Not on file  . Stress: Not on file  Relationships  . Social Herbalist on phone: Not on file    Gets together: Not on file    Attends religious service: Not on file    Active member of club or organization: Not on file    Attends meetings  of clubs or organizations: Not on file    Relationship status: Not on file  Other Topics Concern  . Not on file  Social History Narrative   Retired - Optometrist   Widower   2 children (one in [place] and on one in [place])    Drinks 1 cup of coffee per day    Outpatient Encounter Medications as of 05/23/2019  Medication Sig  . acetaminophen (TYLENOL) 500 MG tablet Take 1,000 mg by mouth every 6 (six) hours as needed for mild pain.  Marland Kitchen amiodarone (PACERONE) 200 MG tablet Take 0.5 tablets (100 mg total) by mouth daily.  Marland Kitchen b complex vitamins tablet Take 1 tablet by mouth daily.  . cycloSPORINE (RESTASIS) 0.05 % ophthalmic emulsion Place 1 drop into both eyes 2 (two) times daily.  . ferrous fumarate (HEMOCYTE - 106 MG FE) 325 (106 Fe) MG TABS tablet Take 1 tablet (106 mg of iron total) by mouth daily.  . fluticasone (FLONASE) 50 MCG/ACT nasal spray Place 2 sprays into both nostrils daily.   Marland Kitchen gabapentin (NEURONTIN) 300 MG capsule TAKE 1 CAPSULE TWICE A DAY (Patient taking differently: Take 300 mg by mouth 2 (two) times daily. )  . metoprolol succinate (TOPROL-XL) 50 MG 24 hr tablet TAKE 1 TABLET DAILY WITH OR IMMEDIATELY FOLLOWING A MEAL (Patient taking differently: Take 50 mg by mouth daily. )  . Multiple Vitamin (MULTIVITAMIN WITH MINERALS) TABS tablet Take 1 tablet by mouth every evening.   . potassium chloride SA (K-DUR,KLOR-CON) 20 MEQ tablet Take 2 tablets (40 mEq total) by mouth daily.  . rosuvastatin (CRESTOR) 5 MG tablet TAKE 1 TABLET DAILY  .  torsemide (DEMADEX) 20 MG tablet TAKE 2 TABLETS DAILY  . traZODone (DESYREL) 100 MG tablet Take 300 mg by mouth at bedtime.   Marland Kitchen warfarin (COUMADIN) 5 MG tablet TAKE 1 TABLET DAILY AS DIRECTED BY THE COUMADIN CLINIC  . [DISCONTINUED] cefdinir (OMNICEF) 300 MG capsule Take 1 capsule (300 mg total) by mouth 2 (two) times daily.  . [DISCONTINUED] pantoprazole (PROTONIX) 40 MG tablet Take 1 tablet (40 mg total) by mouth daily before breakfast.   No facility-administered encounter medications on file as of 05/23/2019.     Activities of Daily Living In your present state of health, do you have any difficulty performing the following activities: 05/23/2019 04/02/2019  Hearing? N Y  Vision? N N  Difficulty concentrating or making decisions? N N  Walking or climbing stairs? Y Y  Comment chair lift -  Dressing or bathing? Tempie Donning  Comment caregiver assists -  Doing errands, shopping? Tempie Donning  Preparing Food and eating ? Y -  Using the Toilet? Y -  In the past six months, have you accidently leaked urine? Y -  Comment wears briefs -  Do you have problems with loss of bowel control? Y -  Managing your Medications? N -  Managing your Finances? N -  Housekeeping or managing your Housekeeping? Y -  Some recent data might be hidden    Patient Care Team: [name], MD as PCP - General (Family Medicine) Darlin Coco, MD as Referring Physician (Cardiology) Willodean Rosenthal, MD as Consulting Physician (Internal Medicine) Dillingham, Loel Lofty, DO as Consulting Physician (Plastic Surgery) Magnus Sinning, MD as Consulting Physician [name], MD as Consulting Physician (Cardiology) Bensimhon, Shaune Pascal, MD as Consulting Physician (Cardiology) Danella Sensing, MD as Consulting Physician (Dermatology) Eppie Gibson as Physician Assistant (Physician Assistant) Ceasar Mons, MD as  Consulting Physician (Urology) Garald Balding, MD as Consulting Physician (Orthopedic  Surgery) Cira Rue, RN Nurse Navigator as Registered Nurse (Medical Oncology)   Assessment:   This is a routine wellness examination for [place]. Physical assessment deferred to PCP.  Exercise Activities and Dietary recommendations Current Exercise Habits: The patient does not participate in regular exercise at present, Exercise limited by: None identified Diet (meal preparation, eat out, water intake, caffeinated beverages, dairy products, fruits and vegetables): well balanced, on average, 3 meals per day   Goals    . Gain weight       Fall Risk Fall Risk  05/23/2019 05/18/2018 09/22/2017 01/11/2015  Falls in the past year? 0 Yes Yes Yes  Number falls in past yr: - 2 or more 2 or more 1  Injury with Fall? - No No No  Risk for fall due to : - - History of fall(s) (No Data)  Risk for fall due to: Comment - - - Syncope  Follow up - Education provided;Falls prevention discussed - -   Depression Screen PHQ 2/9 Scores 05/23/2019 05/18/2018 09/22/2017 01/11/2015  PHQ - 2 Score 0 0 0 0    Cognitive Function Ad8 score reviewed for issues:  Issues making decisions:no  Less interest in hobbies / activities:no  Repeats questions, stories (family complaining):no  Trouble using ordinary gadgets (microwave, computer, phone):no  Forgets the month or year: no  Mismanaging finances: no  Remembering appts:no  Daily problems with thinking and/or memory:no Ad8 score is=0     MMSE - Mini Mental State Exam 05/18/2018  Not completed: Refused        Immunization History  Administered Date(s) Administered  . Influenza Split 08/04/2012  . Influenza Whole 08/15/2008  . Influenza, High Dose Seasonal PF 08/05/2018  . Influenza,inj,Quad PF,6+ Mos 08/18/2016  . Influenza-Unspecified 08/17/2013, 10/10/2014, 08/18/2015, 08/17/2017  . Pneumococcal Conjugate-13 10/10/2014  . Pneumococcal Polysaccharide-23 09/29/2017  . Zoster 08/17/2013   Screening Tests Health Maintenance  Topic Date Due  .  TETANUS/TDAP  03/30/1950  . INFLUENZA VACCINE  06/18/2019  . PNA vac Low Risk Adult  Completed  . COLONOSCOPY  Discontinued     Plan:    Please schedule your next medicare wellness visit with me in 1 yr.  Continue to eat heart healthy diet (full of fruits, vegetables, whole grains, lean protein, water--limit salt, fat, and sugar intake) and increase physical activity as tolerated.  Continue doing brain stimulating activities (puzzles, reading, adult coloring books, staying active) to keep memory sharp.    I have personally reviewed and noted the following in the patient's chart:   . Medical and social history . Use of alcohol, tobacco or illicit drugs  . Current medications and supplements . Functional ability and status . Nutritional status . Physical activity . Advanced directives . List of other physicians . Hospitalizations, surgeries, and ER visits in previous 12 months . Vitals . Screenings to include cognitive, depression, and falls . Referrals and appointments  In addition, I have reviewed and discussed with patient certain preventive protocols, quality metrics, and best practice recommendations. A written personalized care plan for preventive services as well as general preventive health recommendations were provided to patient.     Shela Nevin, [place]  05/23/2019

## 2019-05-23 ENCOUNTER — Ambulatory Visit (INDEPENDENT_AMBULATORY_CARE_PROVIDER_SITE_OTHER): Payer: Medicare Other | Admitting: *Deleted

## 2019-05-23 ENCOUNTER — Encounter: Payer: Self-pay | Admitting: *Deleted

## 2019-05-23 ENCOUNTER — Other Ambulatory Visit: Payer: Self-pay

## 2019-05-23 DIAGNOSIS — Z0000 Encounter for general adult medical examination without abnormal findings: Secondary | ICD-10-CM | POA: Diagnosis not present

## 2019-05-23 NOTE — Patient Instructions (Signed)
Please schedule your next medicare wellness visit with me in 1 yr.  Continue to eat heart healthy diet (full of fruits, vegetables, whole grains, lean protein, water--limit salt, fat, and sugar intake) and increase physical activity as tolerated.  Continue doing brain stimulating activities (puzzles, reading, adult coloring books, staying active) to keep memory sharp.    [name] [name] , Thank you for taking time to come for your Medicare Wellness Visit. I appreciate your ongoing commitment to your health goals. Please review the following plan we discussed and let me know if I can assist you in the future.   These are the goals we discussed: Goals    . Gain weight       This is a list of the screening recommended for you and due dates:  Health Maintenance  Topic Date Due  . Tetanus Vaccine  03/30/1950  . Flu Shot  06/18/2019  . Pneumonia vaccines  Completed  . Colon Cancer Screening  Discontinued    Health Maintenance After Age 43 After age 77, you are at a higher risk for certain long-term diseases and infections as well as injuries from falls. Falls are a major cause of broken bones and head injuries in people who are older than age 60. Getting regular preventive care can help to keep you healthy and well. Preventive care includes getting regular testing and making lifestyle changes as recommended by your health care provider. Talk with your health care provider about:  Which screenings and tests you should have. A screening is a test that checks for a disease when you have no symptoms.  A diet and exercise plan that is right for you. What should I know about screenings and tests to prevent falls? Screening and testing are the best ways to find a health problem early. Early diagnosis and treatment give you the best chance of managing medical conditions that are common after age 41. Certain conditions and lifestyle choices may make you more likely to have a fall. Your health care  provider may recommend:  Regular vision checks. Poor vision and conditions such as cataracts can make you more likely to have a fall. If you wear glasses, make sure to get your prescription updated if your vision changes.  Medicine review. Work with your health care provider to regularly review all of the medicines you are taking, including over-the-counter medicines. Ask your health care provider about any side effects that may make you more likely to have a fall. Tell your health care provider if any medicines that you take make you feel dizzy or sleepy.  Osteoporosis screening. Osteoporosis is a condition that causes the bones to get weaker. This can make the bones weak and cause them to break more easily.  Blood pressure screening. Blood pressure changes and medicines to control blood pressure can make you feel dizzy.  Strength and balance checks. Your health care provider may recommend certain tests to check your strength and balance while standing, walking, or changing positions.  Foot health exam. Foot pain and numbness, as well as not wearing proper footwear, can make you more likely to have a fall.  Depression screening. You may be more likely to have a fall if you have a fear of falling, feel emotionally low, or feel unable to do activities that you used to do.  Alcohol use screening. Using too much alcohol can affect your balance and may make you more likely to have a fall. What actions can I take to lower my  risk of falls? General instructions  Talk with your health care provider about your risks for falling. Tell your health care provider if: ? You fall. Be sure to tell your health care provider about all falls, even ones that seem minor. ? You feel dizzy, sleepy, or off-balance.  Take over-the-counter and prescription medicines only as told by your health care provider. These include any supplements.  Eat a healthy diet and maintain a healthy weight. A healthy diet includes  low-fat dairy products, low-fat (lean) meats, and fiber from whole grains, beans, and lots of fruits and vegetables. Home safety  Remove any tripping hazards, such as rugs, cords, and clutter.  Install safety equipment such as grab bars in bathrooms and safety rails on stairs.  Keep rooms and walkways well-lit. Activity   Follow a regular exercise program to stay fit. This will help you maintain your balance. Ask your health care provider what types of exercise are appropriate for you.  If you need a cane or walker, use it as recommended by your health care provider.  Wear supportive shoes that have nonskid soles. Lifestyle  Do not drink alcohol if your health care provider tells you not to drink.  If you drink alcohol, limit how much you have: ? 0-1 drink a day for women. ? 0-2 drinks a day for men.  Be aware of how much alcohol is in your drink. In the [place] one drink equals one typical bottle of beer (12 oz), one-half glass of wine (5 oz), or one shot of hard liquor (1 oz).  Do not use any products that contain nicotine or tobacco, such as cigarettes and e-cigarettes. If you need help quitting, ask your health care provider. Summary  Having a healthy lifestyle and getting preventive care can help to protect your health and wellness after age 47.  Screening and testing are the best way to find a health problem early and help you avoid having a fall. Early diagnosis and treatment give you the best chance for managing medical conditions that are more common for people who are older than age 67.  Falls are a major cause of broken bones and head injuries in people who are older than age 56. Take precautions to prevent a fall at home.  Work with your health care provider to learn what changes you can make to improve your health and wellness and to prevent falls. This information is not intended to replace advice given to you by your health care provider. Make sure you discuss any  questions you have with your health care provider. Document Released: 09/16/2017 Document Revised: 02/24/2019 Document Reviewed: 09/16/2017 Elsevier Patient Education  2020 [employer].

## 2019-05-30 ENCOUNTER — Telehealth: Payer: Self-pay | Admitting: Family Medicine

## 2019-05-30 NOTE — Telephone Encounter (Signed)
Ok to give VO for PT with [facility] for debility, unsteady gait. This is the first message I have seen

## 2019-05-30 NOTE — Telephone Encounter (Signed)
Please advise 

## 2019-05-30 NOTE — Telephone Encounter (Signed)
Pt called requesting to have another round of PT at home through [facility]. Pt states they have been trying to get in contact with office since last week. Please advise .

## 2019-05-31 NOTE — Telephone Encounter (Signed)
Verbal orders given to Terri at [phone]

## 2019-06-01 ENCOUNTER — Telehealth (HOSPITAL_COMMUNITY): Payer: Self-pay

## 2019-06-01 NOTE — Telephone Encounter (Signed)
Pt left message on voicemail saying that he had some swelling in foot not improved after taking extra fluid tablet,  Pt noted having a fall last week and injuring same foot.  Attempted to call patient back, LM on vm for patient to return call to office

## 2019-06-01 NOTE — Telephone Encounter (Signed)
Pt called back and reports that the foot that is swollen is the foot he injured when he fell (his right foot).  He states his left foot is normal without any swelling.  He did say swelling is slowly improving.  Advised to see ortho or pcp regarding getting a scan to assess for injury.  Pt denies discoloration or numbness in extremity.  He will consult his ortho or pcp for further evaluation.  Pt denies any cardiac/respiratory complaints.

## 2019-06-01 NOTE — Telephone Encounter (Signed)
[facility] is returning a call to [place].  Would also like to re-cert the patient as well.  Please advise and call back at [phone]

## 2019-06-02 ENCOUNTER — Ambulatory Visit: Payer: Self-pay

## 2019-06-02 ENCOUNTER — Other Ambulatory Visit: Payer: Self-pay

## 2019-06-02 ENCOUNTER — Encounter: Payer: Self-pay | Admitting: Orthopaedic Surgery

## 2019-06-02 ENCOUNTER — Ambulatory Visit (INDEPENDENT_AMBULATORY_CARE_PROVIDER_SITE_OTHER): Payer: Medicare Other | Admitting: Orthopaedic Surgery

## 2019-06-02 VITALS — BP 109/59 | HR 65 | Ht 77.0 in | Wt 166.0 lb

## 2019-06-02 DIAGNOSIS — M25571 Pain in right ankle and joints of right foot: Secondary | ICD-10-CM

## 2019-06-02 DIAGNOSIS — S92321A Displaced fracture of second metatarsal bone, right foot, initial encounter for closed fracture: Secondary | ICD-10-CM | POA: Diagnosis not present

## 2019-06-02 DIAGNOSIS — S92323A Displaced fracture of second metatarsal bone, unspecified foot, initial encounter for closed fracture: Secondary | ICD-10-CM | POA: Insufficient documentation

## 2019-06-02 NOTE — Progress Notes (Signed)
Office Visit Note   Patient: [name] [name]           Date of Birth: Sep 11, 1931           MRN: [id number] Visit Date: 06/02/2019              Requested by: [name], MD [address] [place],  [place] [address] PCP: [name], MD   Assessment & Plan: Visit Diagnoses:  1. Pain in right ankle and joints of right foot   2. Closed displaced fracture of second metatarsal bone of right foot, initial encounter     Plan:  #1: Equalizer boot to the right foot #2: Limit weightbearing #3: If he has increasing symptoms he can give [place] a call we will see him earlier #4: Is to try to elevate the leg as much as he can to decrease his swelling.  Follow-Up Instructions: Return in about 3 weeks (around 06/23/2019).   Orders:  Orders Placed This Encounter  Procedures  . XR Foot Complete Right  . XR Ankle Complete Right   No orders of the defined types were placed in this encounter.     Procedures: No procedures performed   Clinical Data: No additional findings.   Subjective: Chief Complaint  Patient presents with  . Right Ankle - Pain  . Right Foot - Pain  Patient presents today with right ankle and foot swelling. He fell one week ago and landed on his right foot. He said that he had pain and swelling the first few days, but now it just remains swollen. He has pain with walking.  Denies any numbness.  HPI  Review of Systems  Constitutional: Negative for fatigue.  HENT: Negative for ear pain.   Eyes: Negative for pain.  Respiratory: Negative for shortness of breath.   Cardiovascular: Positive for leg swelling.  Gastrointestinal: Positive for constipation. Negative for diarrhea.  Endocrine: Negative for cold intolerance and heat intolerance.  Genitourinary: Negative for difficulty urinating.  Musculoskeletal: Positive for joint swelling.  Skin: Negative for rash.  Allergic/Immunologic: Negative for food allergies.  Neurological: Negative for  weakness.  Hematological: Bruises/bleeds easily.  Psychiatric/Behavioral: Negative for sleep disturbance.     Objective: Vital Signs: BP (!) 109/59   Pulse 65   Ht 6\' 5"  (1.956 m)   Wt 166 lb (75.3 kg)   BMI 19.68 kg/m   Physical Exam Constitutional:      Appearance: He is well-developed.  Eyes:     Pupils: Pupils are equal, round, and reactive to light.  Pulmonary:     Effort: Pulmonary effort is normal.  Skin:    General: Skin is warm and dry.  Neurological:     Mental Status: He is alert and oriented to person, place, and time.  Psychiatric:        Behavior: Behavior normal.     Ortho Exam  He does have edema from the mid calf to the toes.  It is 1 1-2+ pitting.  He does not have much in the way of any pain in the calf to palpation.  Cap refill is less than 2 seconds.  Sensation is intact to light touch.  Tender over the second metatarsal.  Specialty Comments:  No specialty comments available.  Imaging: Xr Ankle Complete Right  Result Date: 06/02/2019 No obvious fractures otherwise.  Three-view x-ray of the left ankle reveals no obvious fracture.  He still has some calcification noted in the posterior tibial artery.  Xr Foot Complete Right  Result Date: 06/02/2019 Three-view x-ray of the right foot reveals a midshaft oblique fracture of the second metatarsal.  On the AP it does not appear displaced on the oblique it does have some displacement noted.  No other fractures could be identified.  Some calcification of the posterior tibial artery noted.    PMFS History: Current Outpatient Medications  Medication Sig Dispense Refill  . acetaminophen (TYLENOL) 500 MG tablet Take 1,000 mg by mouth every 6 (six) hours as needed for mild pain.    Marland Kitchen amiodarone (PACERONE) 200 MG tablet Take 0.5 tablets (100 mg total) by mouth daily. 15 tablet 12  . b complex vitamins tablet Take 1 tablet by mouth daily.    . cycloSPORINE (RESTASIS) 0.05 % ophthalmic emulsion Place 1 drop  into both eyes 2 (two) times daily.    . ferrous fumarate (HEMOCYTE - 106 MG FE) 325 (106 Fe) MG TABS tablet Take 1 tablet (106 mg of iron total) by mouth daily. 30 each 3  . fluticasone (FLONASE) 50 MCG/ACT nasal spray Place 2 sprays into both nostrils daily.   11  . gabapentin (NEURONTIN) 300 MG capsule TAKE 1 CAPSULE TWICE A DAY (Patient taking differently: Take 300 mg by mouth 2 (two) times daily. ) 180 capsule 4  . metoprolol succinate (TOPROL-XL) 50 MG 24 hr tablet TAKE 1 TABLET DAILY WITH OR IMMEDIATELY FOLLOWING A MEAL (Patient taking differently: Take 50 mg by mouth daily. ) 90 tablet 4  . Multiple Vitamin (MULTIVITAMIN WITH MINERALS) TABS tablet Take 1 tablet by mouth every evening.     . potassium chloride SA (K-DUR,KLOR-CON) 20 MEQ tablet Take 2 tablets (40 mEq total) by mouth daily. 180 tablet 3  . rosuvastatin (CRESTOR) 5 MG tablet TAKE 1 TABLET DAILY 90 tablet 3  . torsemide (DEMADEX) 20 MG tablet TAKE 2 TABLETS DAILY 180 tablet 3  . traZODone (DESYREL) 100 MG tablet Take 300 mg by mouth at bedtime.     Marland Kitchen warfarin (COUMADIN) 5 MG tablet TAKE 1 TABLET DAILY AS DIRECTED BY THE COUMADIN CLINIC 90 tablet 0   No current facility-administered medications for this visit.     Patient Active Problem List   Diagnosis Date Noted  . Fracture of 2nd metatarsal 06/02/2019  . CAP (community acquired pneumonia) 04/01/2019  . Hypoxia 04/01/2019  . Community acquired pneumonia 04/01/2019  . Knee pain, bilateral 08/08/2018  . Mass of right hand 04/12/2018  . Chronic renal insufficiency 10/06/2017  . Spondylosis without myelopathy or radiculopathy, lumbar region 09/16/2017  . Hypokalemia 04/30/2017  . Leg wound, left, sequela 04/30/2017  . Unstageable pressure ulcer of sacral region ([place]) 04/30/2017  . Fall   . Pressure injury of skin 04/29/2017  . Acute on chronic diastolic (congestive) heart failure ([place])   . PAH (pulmonary artery hypertension) ([place])   . Right-sided heart failure ([place])  04/26/2017  . Chronic diastolic heart failure ([place]) 04/21/2017  . Pulmonary hypertension, primary ([place]) 04/21/2017  . Severe tricuspid regurgitation 03/12/2017  . Bilateral lower extremity edema 02/25/2017  . Anticoagulated 02/25/2017  . Varicose veins of right lower extremity with complications 59/93/5701  . Obstructive lung disease (generalized) ([place]) 09/26/2016  . Carpal tunnel syndrome 06/25/2016  . Hereditary and idiopathic peripheral neuropathy 06/25/2016  . Paresthesia 05/27/2016  . Abnormality of gait 05/27/2016  . Pneumonia 01/09/2016  . Paroxysmal atrial flutter ([place]) 12/05/2015  . Constipation 06/13/2015  . Encounter for therapeutic drug monitoring 05/25/2014  . Syncope 04/03/2014  . Acute  renal failure ([place]) 04/03/2014  . Leukocytosis 04/03/2014  . Internal nasal lesion 05/15/2013  . Low back pain 04/19/2013  . Melanoma ([place]) 09/30/2012  . Cough 03/26/2012  . Hx of mitral valve repair 11/19/2011  . Long term (current) use of anticoagulants 11/03/2011  . Decubitus skin ulcer 10/28/2011  . Pleural effusion due to congestive heart failure ([place]) 10/28/2011  . Atrial fibrillation ([place]) 10/07/2011  . S/P mitral valve repair 10/01/2011  . Valvular heart disease 08/21/2011  . Hearing loss 04/09/2011  . THROMBOCYTOPENIA 09/19/2010  . ADENOCARCINOMA, PROSTATE 09/17/2010  . CALLUS, LEFT FOOT 09/17/2010  . BACK PAIN, CHRONIC 09/17/2010  . TINEA PEDIS 05/28/2009  . DERMATITIS, ATOPIC 04/10/2009  . HYPERCHOLESTEROLEMIA 06/09/2008  . DEPRESSION 06/09/2008  . HYPERTENSION, BENIGN ESSENTIAL 06/09/2008  . GERD 06/09/2008  . OSTEOARTHRITIS, GENERALIZED, MULTIPLE JOINTS 06/09/2008  . MUSCLE SPASM, BACK 06/09/2008  . H/O prostate cancer 06/09/2008  . PERSONAL HISTORY OF MALIGNANT MELANOMA OF SKIN 06/09/2008  . ARRHYTHMIA, HX OF 06/09/2008  . Personal history of colonic polyps 06/09/2008   Past Medical History:  Diagnosis Date  . Abnormality of gait 05/27/2016  . Adenomatous  polyps   . Carpal tunnel syndrome 06/25/2016   Right  . Depressive disorder, not elsewhere classified   . First degree AV block    Holter 3/18: NSR, PACs, PVCs, no AFib, no pauses.  Marland Kitchen Hereditary and idiopathic peripheral neuropathy 06/25/2016  . Hypertension   . Internal nasal lesion 05/15/2013  . Melanoma ([place])    Left Shoulder  . Mitral regurgitation   . MVP (mitral valve prolapse)    a. With severe MR s/p Complex valvuloplasty including artificial Gore-tex neochord placement x4, chordal transposition x1, chordal release x1, # 32 mm Sorin Memo 3D Ring Annuloplasty 2012. // b. Echo 2/18: mild LVH, EF 50-55, mild AI, MV repair with mild MR, mod LAE, mod RVE, severe RAE, severe TR  . Neuropathy   . Normal coronary arteries    a. Normal coronary anatomy by cath 2012.  . Osteoarthritis    Knees  . PAF (paroxysmal atrial fibrillation) ([place])    a. Post-op MVR 2012.  Marland Kitchen Personal history of colonic polyps   . Prostate cancer ([place])   . Pulmonary HTN ([place])    a. Mild-mod by cath 2012.  . Pure hypercholesterolemia   . PVC (premature ventricular contraction)   . Thrombocytopenia ([place])   . Vision abnormalities    Cornea scarring    Family History  Problem Relation Age of Onset  . Clotting disorder Brother        CVA's  . Arthritis Mother   . Hypertension Mother   . Stroke Mother   . Hypertension Father   . Psychosis Father        psychiatric care  . Colon cancer Neg Hx   . Stomach cancer Neg Hx   . Heart attack Neg Hx   . Prostate cancer Neg Hx   . Pancreatic cancer Neg Hx     Past Surgical History:  Procedure Laterality Date  . CARDIAC CATHETERIZATION  09/2011   Pre-op for MVR -- normal coronaries.  Marland Kitchen CARDIOVERSION N/A 01/02/2016   Procedure: CARDIOVERSION;  Surgeon: Thayer Headings, MD;  Location[facility] ENDOSCOPY;  Service: Cardiovascular;  Laterality: N/A;  . COLONOSCOPY W/ POLYPECTOMY    . INGUINAL HERNIA REPAIR  09/2009   Left  . KNEE ARTHROSCOPY      left x3  and right x2  .  Melanoma Surgery  2001, 2005, 2006, 2009  . MITRAL VALVE REPAIR  10/01/2011   complex valvuloplasty with Goretex cord replacement and chordal transposition 98mm Sorin Memo 3D ring annuloplasty  . Nuclear Stress Test  09/2006   EF-64%, Normal  . PROSTATECTOMY  1993  . RIGHT HEART CATH N/A 04/29/2017   Procedure: Right Heart Cath;  Surgeon: Jolaine Artist, MD;  Location: [facility] CV LAB;  Service: Cardiovascular;  Laterality: N/A;  . ROOT CANAL  08-19-12  . ROTATOR CUFF REPAIR  2003   left  . TEE WITHOUT CARDIOVERSION  09/26/2011   Procedure: TRANSESOPHAGEAL ECHOCARDIOGRAM (TEE);  Surgeon: Lelon Perla, MD;  Location: [facility] ENDOSCOPY;  Service: Cardiovascular;  Laterality: N/A;  . US ECHOCARDIOGRAPHY  09/2009, 08/1011   mild LVH,mild AI,MVP with mild MR, mild-mod. TR with mild Pulm. HTN, EF-55-60%   Social History   Occupational History    Comment: retired  Tobacco Use  . Smoking status: Never Smoker  . Smokeless tobacco: Never Used  Substance and Sexual Activity  . Alcohol use: No    Alcohol/week: 0.0 standard drinks    Comment: Last drink in 2000  . Drug use: No  . Sexual activity: Not Currently

## 2019-06-03 ENCOUNTER — Telehealth: Payer: Self-pay | Admitting: Family Medicine

## 2019-06-03 NOTE — Telephone Encounter (Signed)
Home Health Verbal Orders - Caller/Agency: Coralyn Mark With [facility]  Callback Number: [phone] Requesting OT/PT/Skilled Nursing/Social Work/Speech Therapy: FYI this pt missed PT appt yesterday.  Pt fell a few day ago and he seen the ortho dr.  [name] is in a boot, they instructed no PT for right now

## 2019-06-03 NOTE — Telephone Encounter (Signed)
Noted  

## 2019-06-05 NOTE — Telephone Encounter (Signed)
thanks

## 2019-06-06 ENCOUNTER — Telehealth: Payer: Self-pay

## 2019-06-06 ENCOUNTER — Ambulatory Visit: Payer: Medicare Other | Admitting: Family Medicine

## 2019-06-06 NOTE — Telephone Encounter (Signed)
lmom for prescreen  

## 2019-06-06 NOTE — Telephone Encounter (Signed)
Called [facility] was placed on hold for about 7 minutes then hung up

## 2019-06-07 NOTE — Telephone Encounter (Signed)

## 2019-06-08 ENCOUNTER — Ambulatory Visit (INDEPENDENT_AMBULATORY_CARE_PROVIDER_SITE_OTHER): Payer: Medicare Other

## 2019-06-08 ENCOUNTER — Other Ambulatory Visit: Payer: Self-pay

## 2019-06-08 DIAGNOSIS — I4891 Unspecified atrial fibrillation: Secondary | ICD-10-CM | POA: Diagnosis not present

## 2019-06-08 DIAGNOSIS — Z5181 Encounter for therapeutic drug level monitoring: Secondary | ICD-10-CM | POA: Diagnosis not present

## 2019-06-08 LAB — POCT INR: INR: 2.1 (ref 2.0–3.0)

## 2019-06-08 NOTE — Patient Instructions (Signed)
Description   Continue on same dosage 1/2 tablet daily except 1 tablet on Wednesdays.  Recheck in 4 weeks. Call [phone] if scheduled for any procedures or on any new medications

## 2019-06-14 ENCOUNTER — Inpatient Hospital Stay: Payer: Medicare Other | Attending: Hematology & Oncology | Admitting: Family

## 2019-06-14 ENCOUNTER — Inpatient Hospital Stay: Payer: Medicare Other

## 2019-06-14 ENCOUNTER — Other Ambulatory Visit: Payer: Self-pay

## 2019-06-14 VITALS — BP 109/59 | HR 60 | Temp 97.8°F | Resp 17

## 2019-06-14 DIAGNOSIS — D508 Other iron deficiency anemias: Secondary | ICD-10-CM

## 2019-06-14 DIAGNOSIS — D696 Thrombocytopenia, unspecified: Secondary | ICD-10-CM | POA: Insufficient documentation

## 2019-06-14 LAB — CMP (CANCER CENTER ONLY)
ALT: 13 U/L (ref 0–44)
AST: 17 U/L (ref 15–41)
Albumin: 3.8 g/dL (ref 3.5–5.0)
Alkaline Phosphatase: 70 U/L (ref 38–126)
Anion gap: 7 (ref 5–15)
BUN: 26 mg/dL — ABNORMAL HIGH (ref 8–23)
CO2: 31 mmol/L (ref 22–32)
Calcium: 9.2 mg/dL (ref 8.9–10.3)
Chloride: 103 mmol/L (ref 98–111)
Creatinine: 1.27 mg/dL — ABNORMAL HIGH (ref 0.61–1.24)
GFR, Est AFR Am: 59 mL/min — ABNORMAL LOW (ref >60)
GFR, Estimated: 51 mL/min — ABNORMAL LOW (ref >60)
Glucose, Bld: 74 mg/dL (ref 70–99)
Potassium: 4.7 mmol/L (ref 3.5–5.1)
Sodium: 141 mmol/L (ref 135–145)
Total Bilirubin: 0.7 mg/dL (ref 0.3–1.2)
Total Protein: 6.9 g/dL (ref 6.5–8.1)

## 2019-06-14 LAB — CBC WITH DIFFERENTIAL (CANCER CENTER ONLY)
Abs Immature Granulocytes: 0.06 10*3/uL (ref 0.00–0.07)
Basophils Absolute: 0 10*3/uL (ref 0.0–0.1)
Basophils Relative: 0 %
Eosinophils Absolute: 0 10*3/uL (ref 0.0–0.5)
Eosinophils Relative: 0 %
HCT: 41.8 % (ref 39.0–52.0)
Hemoglobin: 13.2 g/dL (ref 13.0–17.0)
Immature Granulocytes: 1 %
Lymphocytes Relative: 25 %
Lymphs Abs: 1.6 10*3/uL (ref 0.7–4.0)
MCH: 28.6 pg (ref 26.0–34.0)
MCHC: 31.6 g/dL (ref 30.0–36.0)
MCV: 90.7 fL (ref 80.0–100.0)
Monocytes Absolute: 1.3 10*3/uL — ABNORMAL HIGH (ref 0.1–1.0)
Monocytes Relative: 20 %
Neutro Abs: 3.5 10*3/uL (ref 1.7–7.7)
Neutrophils Relative %: 54 %
Platelet Count: 89 10*3/uL — ABNORMAL LOW (ref 150–400)
RBC: 4.61 MIL/uL (ref 4.22–5.81)
RDW: 17.2 % — ABNORMAL HIGH (ref 11.5–15.5)
WBC Count: 6.5 10*3/uL (ref 4.0–10.5)
nRBC: 0 % (ref 0.0–0.2)

## 2019-06-14 LAB — RETIC PANEL
Immature Retic Fract: 6.8 % (ref 2.3–15.9)
RBC.: 4.58 MIL/uL (ref 4.22–5.81)
Retic Count, Absolute: 68.7 10*3/uL (ref 19.0–186.0)
Retic Ct Pct: 1.5 % (ref 0.4–3.1)
Reticulocyte Hemoglobin: 32.8 pg (ref >27.9)

## 2019-06-14 LAB — SAVE SMEAR(SSMR), FOR PROVIDER SLIDE REVIEW

## 2019-06-14 NOTE — Progress Notes (Signed)
Hematology and Oncology Follow Up Visit  [name] [name] [id number] 1930/11/22 85 y.o. 06/14/2019   Principle Diagnosis:  Chronic thrombocytopenia-immune-based versus medication  Current Therapy:   Observation   Interim History:  [name] [name] is here today for follow-up. He is doing fairly well. He states that he was hospitalized in May for a week with pneumonia.  He had a fall at home in the bathroom earlier this month and ended up with a closed displaced fracture of second metatarsal bone of right foot. He wears a boot at home and walks with a walker for added support. No new falls. No syncope.  He has some puffiness in his ankles that comes and goes. He is on a diuretic daily which helps reduce fluid retention.  No episodes of bleeding. The skin on his arms is thin and he will bruise easily but not in excess.  No fever, chills, n/v, cough, rash, dizziness, SOB, chest pain, palpitations, abdominal pain or changes in bowel or bladder habits.  No numbness or tingling in his extremities.  He has maintained a good appetite and is staying well hydrated. His weight is stable.  ECOG Performance Status: 1 - Symptomatic but completely ambulatory  Medications:  Allergies as of 06/14/2019   No Known Allergies     Medication List       Accurate as of June 14, 2019 11:48 AM. If you have any questions, ask your nurse or doctor.        acetaminophen 500 MG tablet Commonly known as: TYLENOL Take 1,000 mg by mouth every 6 (six) hours as needed for mild pain.   amiodarone 200 MG tablet Commonly known as: PACERONE Take 0.5 tablets (100 mg total) by mouth daily.   b complex vitamins tablet Take 1 tablet by mouth daily.   cycloSPORINE 0.05 % ophthalmic emulsion Commonly known as: RESTASIS Place 1 drop into both eyes 2 (two) times daily.   ferrous fumarate 325 (106 Fe) MG Tabs tablet Commonly known as: HEMOCYTE - 106 mg FE Take 1 tablet (106 mg of iron total) by mouth daily.    fluticasone 50 MCG/ACT nasal spray Commonly known as: FLONASE Place 2 sprays into both nostrils daily.   gabapentin 300 MG capsule Commonly known as: NEURONTIN TAKE 1 CAPSULE TWICE A DAY   metoprolol succinate 50 MG 24 hr tablet Commonly known as: TOPROL-XL TAKE 1 TABLET DAILY WITH OR IMMEDIATELY FOLLOWING A MEAL What changed: See the new instructions.   multivitamin with minerals Tabs tablet Take 1 tablet by mouth every evening.   potassium chloride SA 20 MEQ tablet Commonly known as: K-DUR Take 2 tablets (40 mEq total) by mouth daily.   rosuvastatin 5 MG tablet Commonly known as: CRESTOR TAKE 1 TABLET DAILY   torsemide 20 MG tablet Commonly known as: DEMADEX TAKE 2 TABLETS DAILY   traZODone 100 MG tablet Commonly known as: DESYREL Take 300 mg by mouth at bedtime.   warfarin 5 MG tablet Commonly known as: COUMADIN Take as directed by the anticoagulation clinic. If you are unsure how to take this medication, talk to your nurse or doctor. Original instructions: TAKE 1 TABLET DAILY AS DIRECTED BY THE COUMADIN CLINIC       Allergies: No Known Allergies  Past Medical History, Surgical history, Social history, and Family History were reviewed and updated.  Review of Systems: All other 10 point review of systems is negative.   Physical Exam:  vitals were not taken for this visit.   Wt Readings  from Last 3 Encounters:  06/02/19 166 lb (75.3 kg)  04/03/19 178 lb 2.1 oz (80.8 kg)  03/15/19 173 lb (78.5 kg)    Ocular: Sclerae unicteric, pupils equal, round and reactive to light Ear-nose-throat: Oropharynx clear, dentition fair Lymphatic: No cervical or supraclavicular adenopathy Lungs no rales or rhonchi, good excursion bilaterally Heart regular rate and rhythm, no murmur appreciated Abd soft, nontender, positive bowel sounds MSK no focal spinal tenderness, no joint edema, no liver or spleen tip palpated on exam, no fluid wave  Neuro: non-focal, well-oriented,  appropriate affect Breasts: Deferred   Lab Results  Component Value Date   WBC 6.5 06/14/2019   HGB 13.2 06/14/2019   HCT 41.8 06/14/2019   MCV 90.7 06/14/2019   PLT 89 (L) 06/14/2019   Lab Results  Component Value Date   FERRITIN 37.9 04/26/2019   IRON 34 (L) 01/25/2018   Lab Results  Component Value Date   RETICCTPCT 1.5 06/14/2019   RBC 4.58 06/14/2019   RBC 4.61 06/14/2019   No results found for: Nils Pyle, [facility] Lab Results  Component Value Date   IGGSERUM 901 05/27/2016   IGMSERUM 422 (H) 05/27/2016   No results found for: Odetta Pink, SPEI   Chemistry      Component Value Date/Time   NA 140 04/26/2019 1335   NA 143 02/23/2017 1103   K 4.4 04/26/2019 1335   CL 103 04/26/2019 1335   CO2 30 04/26/2019 1335   BUN 26 (H) 04/26/2019 1335   BUN 25 02/23/2017 1103   CREATININE 1.32 04/26/2019 1335   CREATININE 1.06 08/25/2016 1339      Component Value Date/Time   CALCIUM 9.3 04/26/2019 1335   ALKPHOS 52 04/26/2019 1335   AST 19 04/26/2019 1335   ALT 13 04/26/2019 1335   BILITOT 0.8 04/26/2019 1335       Impression and Plan: [name] [name] is a very pleasant 85 yo caucasian gentleman with thrombocytopenia.  His platelet count remains stable at 89, Hgb 13.2 and WBC count 6.5.  No intervention needed at this time. We will continue to follow along with him and plan to see him back in another 4 months.  He will contact our office with any questions or concerns. We can certainly see him sooner if needed.   Laverna Peace, NP 7/28/202011:48 AM

## 2019-06-15 LAB — IRON AND TIBC
Iron: 116 ug/dL (ref 42–163)
Saturation Ratios: 35 % (ref 20–55)
TIBC: 330 ug/dL (ref 202–409)
UIBC: 214 ug/dL (ref 117–376)

## 2019-06-15 LAB — FERRITIN: Ferritin: 53 ng/mL (ref 24–336)

## 2019-06-16 ENCOUNTER — Ambulatory Visit: Payer: Medicare Other | Admitting: Family Medicine

## 2019-06-22 ENCOUNTER — Encounter: Payer: Self-pay | Admitting: Orthopaedic Surgery

## 2019-06-22 ENCOUNTER — Ambulatory Visit (INDEPENDENT_AMBULATORY_CARE_PROVIDER_SITE_OTHER): Payer: Medicare Other | Admitting: Orthopaedic Surgery

## 2019-06-22 ENCOUNTER — Ambulatory Visit: Payer: Self-pay

## 2019-06-22 ENCOUNTER — Telehealth: Payer: Self-pay | Admitting: Orthopaedic Surgery

## 2019-06-22 ENCOUNTER — Other Ambulatory Visit: Payer: Self-pay

## 2019-06-22 VITALS — BP 103/54 | HR 56 | Ht 77.0 in | Wt 163.0 lb

## 2019-06-22 DIAGNOSIS — S92321D Displaced fracture of second metatarsal bone, right foot, subsequent encounter for fracture with routine healing: Secondary | ICD-10-CM

## 2019-06-22 DIAGNOSIS — M79671 Pain in right foot: Secondary | ICD-10-CM | POA: Diagnosis not present

## 2019-06-22 NOTE — Telephone Encounter (Signed)
Please advise 

## 2019-06-22 NOTE — Telephone Encounter (Signed)
When he has no pain in the right foot

## 2019-06-22 NOTE — Telephone Encounter (Signed)
Patient called requesting a return call to let him know when he can return to physical therapy.

## 2019-06-22 NOTE — Progress Notes (Signed)
Office Visit Note   Patient: [name] [name]           Date of Birth: 1931/04/25           MRN: [id number] Visit Date: 06/22/2019              Requested by: [name], MD [address] [place],  [place] [id number] PCP: [name], MD   Assessment & Plan: Visit Diagnoses:  1. Pain in right foot   2. Closed displaced fracture of second metatarsal bone of right foot with routine healing, subsequent encounter     Plan: Healing second metatarsal fracture right foot with callus.  Continue with equalizer boot for pain otherwise regular shoes.  Will reevaluate as needed.  Does have history of arthritis in both knees and will consider cortisone injection anytime in the future  Follow-Up Instructions: Return if symptoms worsen or fail to improve.   Orders:  Orders Placed This Encounter  Procedures  . XR Foot Complete Right   No orders of the defined types were placed in this encounter.     Procedures: No procedures performed   Clinical Data: No additional findings.   Subjective: Chief Complaint  Patient presents with  . Right Foot - Follow-up  Patient presents today for a follow up on his right second metatarsal fracture. He is now 4 weeks out from his injury. He is wearing a boot and states that his pain has decreased. He still has swelling in his foot.   HPI  Review of Systems   Objective: Vital Signs: BP (!) 103/54   Pulse (!) 56   Ht 6\' 5"  (1.956 m)   Wt 163 lb (73.9 kg)   BMI 19.33 kg/m   Physical Exam Constitutional:      Appearance: He is well-developed.  Eyes:     Pupils: Pupils are equal, round, and reactive to light.  Pulmonary:     Effort: Pulmonary effort is normal.  Skin:    General: Skin is warm and dry.  Neurological:     Mental Status: He is alert and oriented to person, place, and time.  Psychiatric:        Behavior: Behavior normal.     Ortho Exam awake alert and oriented x3.  Comfortable sitting in the  wheelchair.  Examination of the right foot demonstrates venous stasis changes.  Some mild edema of the dorsum of the foot.  Does have hammertoes and bunion that is nontender.  Still has tenderness over the junction of the middle and distal thirds of the second metatarsal in the area of his fracture.  Less tender.  No crepitation.  Specialty Comments:  No specialty comments available.  Imaging: Xr Foot Complete Right  Result Date: 06/22/2019 Numbness of the right foot obtained in several projections.  The previously identified second metatarsal stress fracture is noted to be in good position without change and abundant callus    PMFS History: Patient Active Problem List   Diagnosis Date Noted  . Fracture of 2nd metatarsal 06/02/2019  . CAP (community acquired pneumonia) 04/01/2019  . Hypoxia 04/01/2019  . Community acquired pneumonia 04/01/2019  . Knee pain, bilateral 08/08/2018  . Mass of right hand 04/12/2018  . Chronic renal insufficiency 10/06/2017  . Spondylosis without myelopathy or radiculopathy, lumbar region 09/16/2017  . Hypokalemia 04/30/2017  . Leg wound, left, sequela 04/30/2017  . Unstageable pressure ulcer of sacral region ([place]) 04/30/2017  . Fall   .  Pressure injury of skin 04/29/2017  . Acute on chronic diastolic (congestive) heart failure ([place])   . PAH (pulmonary artery hypertension) ([place])   . Right-sided heart failure ([place]) 04/26/2017  . Chronic diastolic heart failure ([place]) 04/21/2017  . Pulmonary hypertension, primary ([place]) 04/21/2017  . Severe tricuspid regurgitation 03/12/2017  . Bilateral lower extremity edema 02/25/2017  . Anticoagulated 02/25/2017  . Varicose veins of right lower extremity with complications 50/53/9767  . Obstructive lung disease (generalized) ([place]) 09/26/2016  . Carpal tunnel syndrome 06/25/2016  . Hereditary and idiopathic peripheral neuropathy 06/25/2016  . Paresthesia 05/27/2016  . Abnormality of gait 05/27/2016  . Pneumonia  01/09/2016  . Paroxysmal atrial flutter ([place]) 12/05/2015  . Constipation 06/13/2015  . Encounter for therapeutic drug monitoring 05/25/2014  . Syncope 04/03/2014  . Acute renal failure ([place]) 04/03/2014  . Leukocytosis 04/03/2014  . Internal nasal lesion 05/15/2013  . Low back pain 04/19/2013  . Melanoma ([place]) 09/30/2012  . Cough 03/26/2012  . Hx of mitral valve repair 11/19/2011  . Long term (current) use of anticoagulants 11/03/2011  . Decubitus skin ulcer 10/28/2011  . Pleural effusion due to congestive heart failure ([place]) 10/28/2011  . Atrial fibrillation ([place]) 10/07/2011  . S/P mitral valve repair 10/01/2011  . Valvular heart disease 08/21/2011  . Hearing loss 04/09/2011  . THROMBOCYTOPENIA 09/19/2010  . ADENOCARCINOMA, PROSTATE 09/17/2010  . CALLUS, LEFT FOOT 09/17/2010  . BACK PAIN, CHRONIC 09/17/2010  . TINEA PEDIS 05/28/2009  . DERMATITIS, ATOPIC 04/10/2009  . HYPERCHOLESTEROLEMIA 06/09/2008  . DEPRESSION 06/09/2008  . HYPERTENSION, BENIGN ESSENTIAL 06/09/2008  . GERD 06/09/2008  . OSTEOARTHRITIS, GENERALIZED, MULTIPLE JOINTS 06/09/2008  . MUSCLE SPASM, BACK 06/09/2008  . H/O prostate cancer 06/09/2008  . PERSONAL HISTORY OF MALIGNANT MELANOMA OF SKIN 06/09/2008  . ARRHYTHMIA, HX OF 06/09/2008  . Personal history of colonic polyps 06/09/2008   Past Medical History:  Diagnosis Date  . Abnormality of gait 05/27/2016  . Adenomatous polyps   . Carpal tunnel syndrome 06/25/2016   Right  . Depressive disorder, not elsewhere classified   . First degree AV block    Holter 3/18: NSR, PACs, PVCs, no AFib, no pauses.  Marland Kitchen Hereditary and idiopathic peripheral neuropathy 06/25/2016  . Hypertension   . Internal nasal lesion 05/15/2013  . Melanoma ([place])    Left Shoulder  . Mitral regurgitation   . MVP (mitral valve prolapse)    a. With severe MR s/p Complex valvuloplasty including artificial Gore-tex neochord placement x4, chordal transposition x1, chordal release x1, # 32 mm  Sorin Memo 3D Ring Annuloplasty 2012. // b. Echo 2/18: mild LVH, EF 50-55, mild AI, MV repair with mild MR, mod LAE, mod RVE, severe RAE, severe TR  . Neuropathy   . Normal coronary arteries    a. Normal coronary anatomy by cath 2012.  . Osteoarthritis    Knees  . PAF (paroxysmal atrial fibrillation) ([place])    a. Post-op MVR 2012.  Marland Kitchen Personal history of colonic polyps   . Prostate cancer ([place])   . Pulmonary HTN ([place])    a. Mild-mod by cath 2012.  . Pure hypercholesterolemia   . PVC (premature ventricular contraction)   . Thrombocytopenia ([place])   . Vision abnormalities    Cornea scarring    Family History  Problem Relation Age of Onset  . Clotting disorder Brother        CVA's  . Arthritis Mother   . Hypertension Mother   . Stroke Mother   . Hypertension Father   .  Psychosis Father        psychiatric care  . Colon cancer Neg Hx   . Stomach cancer Neg Hx   . Heart attack Neg Hx   . Prostate cancer Neg Hx   . Pancreatic cancer Neg Hx     Past Surgical History:  Procedure Laterality Date  . CARDIAC CATHETERIZATION  09/2011   Pre-op for MVR -- normal coronaries.  Marland Kitchen CARDIOVERSION N/A 01/02/2016   Procedure: CARDIOVERSION;  Surgeon: Thayer Headings, MD;  Location: [facility] ENDOSCOPY;  Service: Cardiovascular;  Laterality: N/A;  . COLONOSCOPY W/ POLYPECTOMY    . INGUINAL HERNIA REPAIR  09/2009   Left  . KNEE ARTHROSCOPY      left x3  and right x2  . Melanoma Surgery     2001, 2005, 2006, 2009  . MITRAL VALVE REPAIR  10/01/2011   complex valvuloplasty with Goretex cord replacement and chordal transposition 76mm Sorin Memo 3D ring annuloplasty  . Nuclear Stress Test  09/2006   EF-64%, Normal  . PROSTATECTOMY  1993  . RIGHT HEART CATH N/A 04/29/2017   Procedure: Right Heart Cath;  Surgeon: Jolaine Artist, MD;  Location: [facility] LAB;  Service: Cardiovascular;  Laterality: N/A;  . ROOT CANAL  08-19-12  . ROTATOR CUFF REPAIR  2003   left  . TEE WITHOUT CARDIOVERSION   09/26/2011   Procedure: TRANSESOPHAGEAL ECHOCARDIOGRAM (TEE);  Surgeon: Lelon Perla, MD;  [facility];  Service: Cardiovascular;  Laterality: N/A;  . US ECHOCARDIOGRAPHY  09/2009, 08/1011   mild LVH,mild AI,MVP with mild MR, mild-mod. TR with mild Pulm. HTN, EF-55-60%   Social History   Occupational History    Comment: retired  Tobacco Use  . Smoking status: Never Smoker  . Smokeless tobacco: Never Used  Substance and Sexual Activity  . Alcohol use: No    Alcohol/week: 0.0 standard drinks    Comment: Last drink in 2000  . Drug use: No  . Sexual activity: Not Currently

## 2019-06-22 NOTE — Telephone Encounter (Signed)
Spoke with patient and relayed information. 

## 2019-06-23 ENCOUNTER — Encounter: Payer: Self-pay | Admitting: Family Medicine

## 2019-06-23 ENCOUNTER — Other Ambulatory Visit: Payer: Self-pay

## 2019-06-23 ENCOUNTER — Ambulatory Visit (INDEPENDENT_AMBULATORY_CARE_PROVIDER_SITE_OTHER): Payer: Medicare Other | Admitting: Family Medicine

## 2019-06-23 VITALS — BP 102/58 | HR 58 | Temp 97.5°F | Resp 18 | Wt 169.4 lb

## 2019-06-23 DIAGNOSIS — I10 Essential (primary) hypertension: Secondary | ICD-10-CM

## 2019-06-23 DIAGNOSIS — I071 Rheumatic tricuspid insufficiency: Secondary | ICD-10-CM

## 2019-06-23 DIAGNOSIS — R2681 Unsteadiness on feet: Secondary | ICD-10-CM | POA: Diagnosis not present

## 2019-06-23 DIAGNOSIS — K219 Gastro-esophageal reflux disease without esophagitis: Secondary | ICD-10-CM

## 2019-06-23 DIAGNOSIS — E7800 Pure hypercholesterolemia, unspecified: Secondary | ICD-10-CM | POA: Diagnosis not present

## 2019-06-23 DIAGNOSIS — E876 Hypokalemia: Secondary | ICD-10-CM | POA: Diagnosis not present

## 2019-06-23 DIAGNOSIS — N189 Chronic kidney disease, unspecified: Secondary | ICD-10-CM

## 2019-06-23 DIAGNOSIS — N289 Disorder of kidney and ureter, unspecified: Secondary | ICD-10-CM

## 2019-06-23 LAB — COMPREHENSIVE METABOLIC PANEL
ALT: 13 U/L (ref 0–53)
AST: 16 U/L (ref 0–37)
Albumin: 4.1 g/dL (ref 3.5–5.2)
Alkaline Phosphatase: 72 U/L (ref 39–117)
BUN: 23 mg/dL (ref 6–23)
CO2: 31 mEq/L (ref 19–32)
Calcium: 9.7 mg/dL (ref 8.4–10.5)
Chloride: 104 mEq/L (ref 96–112)
Creatinine, Ser: 1.15 mg/dL (ref 0.40–1.50)
GFR: 60.26 mL/min (ref >60.00)
Glucose, Bld: 99 mg/dL (ref 70–99)
Potassium: 4.6 mEq/L (ref 3.5–5.1)
Sodium: 142 mEq/L (ref 135–145)
Total Bilirubin: 0.6 mg/dL (ref 0.2–1.2)
Total Protein: 6.9 g/dL (ref 6.0–8.3)

## 2019-06-23 LAB — LIPID PANEL
Cholesterol: 111 mg/dL (ref 0–200)
HDL: 41.1 mg/dL
LDL Cholesterol: 51 mg/dL (ref 0–99)
NonHDL: 69.67
Total CHOL/HDL Ratio: 3
Triglycerides: 92 mg/dL (ref 0.0–149.0)
VLDL: 18.4 mg/dL (ref 0.0–40.0)

## 2019-06-23 LAB — TSH: TSH: 1.15 u[IU]/mL (ref 0.35–4.50)

## 2019-06-23 NOTE — Patient Instructions (Signed)

## 2019-06-26 NOTE — Assessment & Plan Note (Signed)
Well controlled, no changes to meds. Encouraged heart healthy diet such as the DASH diet and exercise as tolerated.  °

## 2019-06-26 NOTE — Assessment & Plan Note (Signed)
Follows with cardiology will see them in a couple of months. No concerning new symptoms

## 2019-06-26 NOTE — Assessment & Plan Note (Signed)
Avoid offending foods, start probiotics. Do not eat large meals in late evening and consider raising head of bed.  

## 2019-06-26 NOTE — Assessment & Plan Note (Signed)
Encouraged heart healthy diet, increase exercise, avoid trans fats, consider a krill oil cap daily 

## 2019-06-26 NOTE — Assessment & Plan Note (Signed)
resolved 

## 2019-06-26 NOTE — Progress Notes (Signed)
Subjective:    Patient ID: KAIREE ISA, male    DOB: 1931/07/11, 85 y.o.   MRN: [id number]  No chief complaint on file.   HPI Patient is in today for follow up on chronic medical concerns including mitral valve disease, s/p repain, first degree AV block, hypertension, and more. He is doing well mostly trying to maintain quarantine and to maintain a heart healthy diet. Denies CP/palp/SOB/HA/congestion/fevers/GI or GU c/o. Taking meds as prescribed  Past Medical History:  Diagnosis Date  . Abnormality of gait 05/27/2016  . Adenomatous polyps   . Carpal tunnel syndrome 06/25/2016   Right  . Depressive disorder, not elsewhere classified   . First degree AV block    Holter 3/18: NSR, PACs, PVCs, no AFib, no pauses.  Marland Kitchen Hereditary and idiopathic peripheral neuropathy 06/25/2016  . Hypertension   . Internal nasal lesion 05/15/2013  . Melanoma ([place])    Left Shoulder  . Mitral regurgitation   . MVP (mitral valve prolapse)    a. With severe MR s/p Complex valvuloplasty including artificial Gore-tex neochord placement x4, chordal transposition x1, chordal release x1, # 32 mm Sorin Memo 3D Ring Annuloplasty 2012. // b. Echo 2/18: mild LVH, EF 50-55, mild AI, MV repair with mild MR, mod LAE, mod RVE, severe RAE, severe TR  . Neuropathy   . Normal coronary arteries    a. Normal coronary anatomy by cath 2012.  . Osteoarthritis    Knees  . PAF (paroxysmal atrial fibrillation) ([place])    a. Post-op MVR 2012.  Marland Kitchen Personal history of colonic polyps   . Prostate cancer ([place])   . Pulmonary HTN ([place])    a. Mild-mod by cath 2012.  . Pure hypercholesterolemia   . PVC (premature ventricular contraction)   . Thrombocytopenia ([place])   . Vision abnormalities    Cornea scarring    Past Surgical History:  Procedure Laterality Date  . CARDIAC CATHETERIZATION  09/2011   Pre-op for MVR -- normal coronaries.  Marland Kitchen CARDIOVERSION N/A 01/02/2016   Procedure: CARDIOVERSION;  Surgeon: Thayer Headings, MD;   Location: [facility] ENDOSCOPY;  Service: Cardiovascular;  Laterality: N/A;  . COLONOSCOPY W/ POLYPECTOMY    . INGUINAL HERNIA REPAIR  09/2009   Left  . KNEE ARTHROSCOPY      left x3  and right x2  . Melanoma Surgery     2001, 2005, 2006, 2009  . MITRAL VALVE REPAIR  10/01/2011   complex valvuloplasty with Goretex cord replacement and chordal transposition 17mm Sorin Memo 3D ring annuloplasty  . Nuclear Stress Test  09/2006   EF-64%, Normal  . PROSTATECTOMY  1993  . RIGHT HEART CATH N/A 04/29/2017   Procedure: Right Heart Cath;  Surgeon: Jolaine Artist, MD;  Location: [facility] CV LAB;  Service: Cardiovascular;  Laterality: N/A;  . ROOT CANAL  08-19-12  . ROTATOR CUFF REPAIR  2003   left  . TEE WITHOUT CARDIOVERSION  09/26/2011   Procedure: TRANSESOPHAGEAL ECHOCARDIOGRAM (TEE);  Surgeon: Lelon Perla, MD;  Location: [facility] ENDOSCOPY;  Service: Cardiovascular;  Laterality: N/A;  . US ECHOCARDIOGRAPHY  09/2009, 08/1011   mild LVH,mild AI,MVP with mild MR, mild-mod. TR with mild Pulm. HTN, EF-55-60%    Family History  Problem Relation Age of Onset  . Clotting disorder Brother        CVA's  . Arthritis Mother   . Hypertension Mother   . Stroke Mother   . Hypertension Father   . Psychosis Father  psychiatric care  . Colon cancer Neg Hx   . Stomach cancer Neg Hx   . Heart attack Neg Hx   . Prostate cancer Neg Hx   . Pancreatic cancer Neg Hx     Social History   Socioeconomic History  . Marital status: Widowed    Spouse name: Not on file  . Number of children: 2  . Years of education: 24  . Highest education level: Not on file  Occupational History    Comment: retired  Scientific laboratory technician  . Financial resource strain: Not on file  . Food insecurity    Worry: Not on file    Inability: Not on file  . Transportation needs    Medical: Not on file    Non-medical: Not on file  Tobacco Use  . Smoking status: Never Smoker  . Smokeless tobacco: Never Used  Substance and  Sexual Activity  . Alcohol use: No    Alcohol/week: 0.0 standard drinks    Comment: Last drink in 2000  . Drug use: No  . Sexual activity: Not Currently  Lifestyle  . Physical activity    Days per week: Not on file    Minutes per session: Not on file  . Stress: Not on file  Relationships  . Social Herbalist on phone: Not on file    Gets together: Not on file    Attends religious service: Not on file    Active member of club or organization: Not on file    Attends meetings of clubs or organizations: Not on file    Relationship status: Not on file  . Intimate partner violence    Fear of current or ex partner: Not on file    Emotionally abused: Not on file    Physically abused: Not on file    Forced sexual activity: Not on file  Other Topics Concern  . Not on file  Social History Narrative   Retired - Optometrist   Widower   2 children (one in [place] and on one in [place])    Drinks 1 cup of coffee per day    Outpatient Medications Prior to Visit  Medication Sig Dispense Refill  . acetaminophen (TYLENOL) 500 MG tablet Take 1,000 mg by mouth every 6 (six) hours as needed for mild pain.    Marland Kitchen amiodarone (PACERONE) 200 MG tablet Take 0.5 tablets (100 mg total) by mouth daily. 15 tablet 12  . b complex vitamins tablet Take 1 tablet by mouth daily.    . cycloSPORINE (RESTASIS) 0.05 % ophthalmic emulsion Place 1 drop into both eyes 2 (two) times daily.    . ferrous fumarate (HEMOCYTE - 106 MG FE) 325 (106 Fe) MG TABS tablet Take 1 tablet (106 mg of iron total) by mouth daily. 30 each 3  . fluticasone (FLONASE) 50 MCG/ACT nasal spray Place 2 sprays into both nostrils daily.   11  . gabapentin (NEURONTIN) 300 MG capsule TAKE 1 CAPSULE TWICE A DAY (Patient taking differently: Take 300 mg by mouth 2 (two) times daily. ) 180 capsule 4  . metoprolol succinate (TOPROL-XL) 50 MG 24 hr tablet TAKE 1 TABLET DAILY WITH OR IMMEDIATELY FOLLOWING A MEAL (Patient taking differently:  Take 50 mg by mouth daily. ) 90 tablet 4  . Multiple Vitamin (MULTIVITAMIN WITH MINERALS) TABS tablet Take 1 tablet by mouth every evening.     . potassium chloride SA (K-DUR,KLOR-CON) 20 MEQ tablet Take 2 tablets (40 mEq total) by  mouth daily. 180 tablet 3  . rosuvastatin (CRESTOR) 5 MG tablet TAKE 1 TABLET DAILY 90 tablet 3  . torsemide (DEMADEX) 20 MG tablet TAKE 2 TABLETS DAILY 180 tablet 3  . traZODone (DESYREL) 100 MG tablet Take 300 mg by mouth at bedtime.     Marland Kitchen warfarin (COUMADIN) 5 MG tablet TAKE 1 TABLET DAILY AS DIRECTED BY THE COUMADIN CLINIC 90 tablet 0   No facility-administered medications prior to visit.     No Known Allergies  Review of Systems  Constitutional: Negative for fever and malaise/fatigue.  HENT: Negative for congestion.   Eyes: Negative for blurred vision.  Respiratory: Positive for shortness of breath.   Cardiovascular: Positive for leg swelling. Negative for chest pain and palpitations.  Gastrointestinal: Negative for abdominal pain, blood in stool and nausea.  Genitourinary: Negative for dysuria and frequency.  Musculoskeletal: Positive for back pain and joint pain. Negative for falls.  Skin: Negative for rash.  Neurological: Negative for dizziness, loss of consciousness and headaches.  Endo/Heme/Allergies: Negative for environmental allergies.  Psychiatric/Behavioral: Negative for depression. The patient is not nervous/anxious.        Objective:    Physical Exam Vitals signs and nursing note reviewed.  Constitutional:      General: He is not in acute distress.    Appearance: He is well-developed.  HENT:     Head: Normocephalic and atraumatic.     Nose: Nose normal.  Eyes:     General:        Right eye: No discharge.        Left eye: No discharge.  Neck:     Musculoskeletal: Normal range of motion and neck supple.  Cardiovascular:     Rate and Rhythm: Normal rate.     Heart sounds: No murmur.  Pulmonary:     Effort: Pulmonary effort is  normal.     Breath sounds: Normal breath sounds.  Abdominal:     General: Bowel sounds are normal.     Palpations: Abdomen is soft.     Tenderness: There is no abdominal tenderness.  Skin:    General: Skin is warm and dry.  Neurological:     Mental Status: He is alert and oriented to person, place, and time.     BP (!) 102/58 (BP Location: Left Arm, Patient Position: Sitting, Cuff Size: Normal)   Pulse (!) 58   Temp (!) 97.5 F (36.4 C) (Oral)   Resp 18   Wt 169 lb 6.4 oz (76.8 kg)   SpO2 97%   BMI 20.09 kg/m  Wt Readings from Last 3 Encounters:  06/23/19 169 lb 6.4 oz (76.8 kg)  06/22/19 163 lb (73.9 kg)  06/02/19 166 lb (75.3 kg)    Diabetic Foot Exam - Simple   No data filed     Lab Results  Component Value Date   WBC 6.5 06/14/2019   HGB 13.2 06/14/2019   HCT 41.8 06/14/2019   PLT 89 (L) 06/14/2019   GLUCOSE 99 06/23/2019   CHOL 111 06/23/2019   TRIG 92.0 06/23/2019   HDL 41.10 06/23/2019   LDLCALC 51 06/23/2019   ALT 13 06/23/2019   AST 16 06/23/2019   NA 142 06/23/2019   K 4.6 06/23/2019   CL 104 06/23/2019   CREATININE 1.15 06/23/2019   BUN 23 06/23/2019   CO2 31 06/23/2019   TSH 1.15 06/23/2019   PSA 0.07 (L) 09/17/2010   INR 2.1 06/08/2019   HGBA1C 6.0 (H) 04/03/2014  Lab Results  Component Value Date   TSH 1.15 06/23/2019   Lab Results  Component Value Date   WBC 6.5 06/14/2019   HGB 13.2 06/14/2019   HCT 41.8 06/14/2019   MCV 90.7 06/14/2019   PLT 89 (L) 06/14/2019   Lab Results  Component Value Date   NA 142 06/23/2019   K 4.6 06/23/2019   CO2 31 06/23/2019   GLUCOSE 99 06/23/2019   BUN 23 06/23/2019   CREATININE 1.15 06/23/2019   BILITOT 0.6 06/23/2019   ALKPHOS 72 06/23/2019   AST 16 06/23/2019   ALT 13 06/23/2019   PROT 6.9 06/23/2019   ALBUMIN 4.1 06/23/2019   CALCIUM 9.7 06/23/2019   ANIONGAP 7 06/14/2019   GFR 60.26 06/23/2019   Lab Results  Component Value Date   CHOL 111 06/23/2019   Lab Results   Component Value Date   HDL 41.10 06/23/2019   Lab Results  Component Value Date   LDLCALC 51 06/23/2019   Lab Results  Component Value Date   TRIG 92.0 06/23/2019   Lab Results  Component Value Date   CHOLHDL 3 06/23/2019   Lab Results  Component Value Date   HGBA1C 6.0 (H) 04/03/2014       Assessment & Plan:   Problem List Items Addressed This Visit    Severe tricuspid regurgitation (Chronic)    Follows with cardiology will see them in a couple of months. No concerning new symptoms      HYPERCHOLESTEROLEMIA - Primary    Encouraged heart healthy diet, increase exercise, avoid trans fats, consider a krill oil cap daily      Relevant Orders   Lipid panel (Completed)   HYPERTENSION, BENIGN ESSENTIAL    Well controlled, no changes to meds. Encouraged heart healthy diet such as the DASH diet and exercise as tolerated.       Relevant Orders   TSH (Completed)   Comprehensive metabolic panel (Completed)   GERD    Avoid offending foods, start probiotics. Do not eat large meals in late evening and consider raising head of bed.       Hypokalemia    resolved      Chronic renal insufficiency    Improved, continue to monitor       Other Visit Diagnoses    Unsteady gait       Relevant Orders   Ambulatory referral to Physical Therapy      I am having Meta Hatchet. Galentine maintain his traZODone, multivitamin with minerals, cycloSPORINE, potassium chloride SA, fluticasone, amiodarone, gabapentin, metoprolol succinate, ferrous fumarate, b complex vitamins, acetaminophen, rosuvastatin, warfarin, and torsemide.  No orders of the defined types were placed in this encounter.    Penni Homans, MD

## 2019-06-26 NOTE — Assessment & Plan Note (Signed)
Improved, continue to monitor

## 2019-06-28 NOTE — Progress Notes (Signed)
Cardiology Office Note:    Date:  06/29/2019   ID:  [name] [name], DOB [birth year], MRN [id number]  PCP:  [name], MD  Cardiologist:  Larae Grooms, MD  Electrophysiologist:  None  Neurologist:Dr. Jannifer Franklin VVS:Dr. Trula Slade Pulmonology: Dr. Lamonte Sakai Advanced [facility] Failure Clinic:  [name], MD    Referring MD: [name], MD   Chief Complaint  Patient presents with  . Follow-up    AFib, CHF    History of Present Illness:    [name] [name] is a 85 y.o. 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  He is here alone.  He notes that he is doing fairly well.  His weights have been stable.  His leg swelling has been stable.  He has not had chest pain or significant shortness of breath.  He has not had syncope.  He sometimes gets dizzy when he stands up quickly.  He has not had any bleeding issues.  Prior CV studies:   The following studies were reviewed today:  Echocardiogram 04/06/2019 EF 55-60, normal RV SF, RVSP 34.7, severe RAE, moderate pleural effusion, moderate to severe TR, mild calcification of aortic valve, trivial AI  Echocardiogram 09/04/17 EF 60-65, normal wall motion, grade 2 diastolic dysfunction, mild AI mild MR, moderate LAE severe RAE, moderate to severe TR, PASP 25, GLS -19.7%  R Cardiac catheterization 04/29/17 Findings: RA = 8 RV = 36/7 PA = 33/4 (15) PCW = 6 Fick  cardiac output/index = 7.7/3.6 PVR = 1.2 WU Ao sat = 99% PA sat = 69%, 72% SVC sat = 69% Assessment: Well compensated filling pressures with high cardiac output. No evidence of PAH.  Mildly elevated RA pressure in setting of normal PCWP suggests mild RV failure  24-hour Holter 01/16/2017  Predominantly normal sinus rhythm with occasional PVCs and PACs.  No atrial fibrillation.  Echo 12/19/16 Mild LVH, EF 50-55, mild AI, mitral valve repair functioning normally with mild residual MR, moderate LAE, moderate RVE, severe RAE, severe TR  ABIs 07/07/16 Normal  Echo 12/27/15 Mild concentric LVH, EF 45-50%, normal wall motion, grade 2 diastolic dysfunction, trivial AI, s/p MV repair withnormal function, mild mitral stenosis, mean gradient 4 mmHg, mild MR, mild LAE, moderate RVE, severe RAE, moderate TR  Event Monitor 5/15 occ AFib, predominant NSR, occ PVCs, no VT  Echo (03/2014):  Moderate LVH. EF 50%. MV repair okay, mild MS. Right ventricle: The cavity size was mildly dilated. Systolic function was mildly reduced. - Right atrium: The atrium was mildly dilated. - No obvious change from the prior study.  Carotid US 11/12 No sig ICA stenosis  LHC 10/12 LM normal LAD normal LCx normal RCA normal EF 60%, severe MR   Past Medical History:  Diagnosis Date  . Abnormality of gait 05/27/2016  . Adenomatous polyps   . Carpal tunnel syndrome 06/25/2016   Right  . Depressive disorder, not  elsewhere classified   . First degree AV block    Holter 3/18: NSR, PACs, PVCs, no AFib, no pauses.  Marland Kitchen Hereditary and idiopathic peripheral neuropathy 06/25/2016  . Hypertension   . Internal nasal lesion 05/15/2013  . Melanoma ([place])    Left Shoulder  . Mitral regurgitation   . MVP (mitral valve prolapse)    a. With severe MR s/p Complex valvuloplasty including artificial Gore-tex neochord placement x4, chordal transposition x1, chordal release x1, # 32 mm Sorin Memo 3D Ring Annuloplasty 2012. // b.  Echo 2/18: mild LVH, EF 50-55, mild AI, MV repair with mild MR, mod LAE, mod RVE, severe RAE, severe TR  . Neuropathy   . Normal coronary arteries    a. Normal coronary anatomy by cath 2012.  . Osteoarthritis    Knees  . PAF (paroxysmal atrial fibrillation) ([place])    a. Post-op MVR 2012.  Marland Kitchen Personal history of colonic polyps   . Prostate cancer ([place])   . Pulmonary HTN ([place])    a. Mild-mod by cath 2012.  . Pure hypercholesterolemia   . PVC (premature ventricular contraction)   . Thrombocytopenia ([place])   . Vision abnormalities    Cornea scarring   Surgical Hx: The patient  has a past surgical history that includes Nuclear Stress Test (09/2006); US ECHOCARDIOGRAPHY (09/2009, 08/1011); Inguinal hernia repair (09/2009); Knee arthroscopy; Colonoscopy w/ polypectomy; Prostatectomy (1993); Rotator cuff repair (2003); Melanoma Surgery; TEE without cardioversion (09/26/2011); Mitral valve repair (10/01/2011); Root canal (08-19-12); Cardiac catheterization (09/2011); Cardioversion (N/A, 01/02/2016); and RIGHT HEART CATH (N/A, 04/29/2017).   Current Medications: Current Meds  Medication Sig  . acetaminophen (TYLENOL) 500 MG tablet Take 1,000 mg by mouth every 6 (six) hours as needed for mild pain.  Marland Kitchen amiodarone (PACERONE) 200 MG tablet Take 0.5 tablets (100 mg total) by mouth daily.  Marland Kitchen b complex vitamins tablet Take 1 tablet by mouth daily.  . cycloSPORINE (RESTASIS) 0.05 % ophthalmic emulsion Place 1 drop into both eyes 2 (two) times daily.  . ferrous fumarate (HEMOCYTE - 106 MG FE) 325 (106 Fe) MG TABS tablet Take 1 tablet (106 mg of iron total) by mouth daily.  . fluticasone (FLONASE) 50 MCG/ACT nasal spray Place 2 sprays into both nostrils daily.   Marland Kitchen gabapentin (NEURONTIN) 300 MG capsule TAKE 1 CAPSULE TWICE A DAY  . metoprolol succinate (TOPROL-XL) 50 MG 24 hr tablet TAKE 1 TABLET DAILY WITH OR IMMEDIATELY FOLLOWING A MEAL (Patient taking differently: Take 50 mg by mouth daily. )  . Multiple  Vitamin (MULTIVITAMIN WITH MINERALS) TABS tablet Take 1 tablet by mouth every evening.   . potassium chloride SA (K-DUR,KLOR-CON) 20 MEQ tablet Take 2 tablets (40 mEq total) by mouth daily.  . rosuvastatin (CRESTOR) 5 MG tablet TAKE 1 TABLET DAILY  . torsemide (DEMADEX) 20 MG tablet TAKE 2 TABLETS DAILY  . traZODone (DESYREL) 100 MG tablet Take 300 mg by mouth at bedtime.   Marland Kitchen warfarin (COUMADIN) 5 MG tablet TAKE 1 TABLET DAILY AS DIRECTED BY THE COUMADIN CLINIC     Allergies:   Patient has no known allergies.   Social History   Tobacco Use  . Smoking status: Never Smoker  . Smokeless tobacco: Never Used  Substance Use Topics  . Alcohol use: No    Alcohol/week: 0.0 standard drinks    Comment: Last drink in 2000  . Drug use: No     Family Hx: The patient's family history includes Arthritis in his mother; Clotting disorder in his  brother; Hypertension in his father and mother; Psychosis in his father; Stroke in his mother. There is no history of Colon cancer, Stomach cancer, Heart attack, Prostate cancer, or Pancreatic cancer.  ROS:   Please see the history of present illness.    ROS All other systems reviewed and are negative.   EKGs/Labs/Other Test Reviewed:    EKG:  EKG is  ordered today.  The ekg ordered today demonstrates sinus rhythm, ventricular rate approximately 60, long first-degree AV block, PR interval 320 ms, no change from prior tracing  Recent Labs: 04/03/2019: Magnesium 2.3 04/05/2019: B Natriuretic Peptide 607.8 04/26/2019: Pro B Natriuretic peptide (BNP) 444.0 06/14/2019: Hemoglobin 13.2; Platelet Count 89 06/23/2019: ALT 13; BUN 23; Creatinine, Ser 1.15; Potassium 4.6; Sodium 142; TSH 1.15   Recent Lipid Panel Lab Results  Component Value Date/Time   CHOL 111 06/23/2019 12:16 PM   TRIG 92.0 06/23/2019 12:16 PM   HDL 41.10 06/23/2019 12:16 PM   CHOLHDL 3 06/23/2019 12:16 PM   LDLCALC 51 06/23/2019 12:16 PM    Physical Exam:    VS:  BP 102/60   Pulse (!)  115   Ht 6\' 5"  (1.956 m)   Wt 170 lb (77.1 kg)   BMI 20.16 kg/m     Wt Readings from Last 3 Encounters:  06/29/19 170 lb (77.1 kg)  06/23/19 169 lb 6.4 oz (76.8 kg)  06/22/19 163 lb (73.9 kg)     Physical Exam  Constitutional: He is oriented to person, place, and time. He appears well-developed and well-nourished. No distress.  HENT:  Head: Normocephalic and atraumatic.  Eyes: No scleral icterus.  Neck: No thyromegaly present.  Cardiovascular: Normal rate and regular rhythm.  Murmur heard.  Low-pitched systolic murmur is present with a grade of 2/6 at the lower left sternal border. Pulmonary/Chest: Effort normal and breath sounds normal. He has no rales.  Abdominal: Soft. There is no hepatomegaly.  Musculoskeletal:        General: Edema (1+ bilat ankle edema) present.  Lymphadenopathy:    He has no cervical adenopathy.  Neurological: He is alert and oriented to person, place, and time.  Skin: Skin is warm and dry.  Psychiatric: He has a normal mood and affect.    ASSESSMENT & PLAN:    1. Chronic combined systolic and diastolic CHF (congestive heart failure) ([place]) 2. Chronic right-sided CHF (congestive heart failure) ([place]) He has combined left-sided and right-sided heart failure.  His right-sided heart failure is more prominent.  He has severe tricuspid regurgitation that is to be managed medically.  He has been followed chronically in the heart failure clinic.  Continue current dose of diuretic and follow-up with Dr. Haroldine Laws as planned.  3. Severe tricuspid regurgitation As noted, he is not a candidate for tricuspid valve surgery due to advanced age and comorbid illnesses.  4. Paroxysmal atrial fibrillation (HCC) Maintaining sinus rhythm on amiodarone.  Recent TSH, ALT normal.  He has a chronic first-degree AV block and is tolerating beta-blocker therapy.  5. Mitral valve insufficiency, unspecified etiology 6. S/P mitral valve repair Stable by recent echocardiogram.   Continue SBE prophylaxis.  7. Essential hypertension Blood pressure somewhat low.  His Cardizem was stopped in the hospital when he was admitted for pneumonia in May.  I have asked him to continue to monitor his blood pressure and let me know if it is running low.  At that point, we could decrease his metoprolol further.  Dispo:  Return in about 6 months (around  12/30/2019) for Routine Follow Up w/ Dr. Irish Lack.   Medication Adjustments/Labs and Tests Ordered: Current medicines are reviewed at length with the patient today.  Concerns regarding medicines are outlined above.  Tests Ordered: Orders Placed This Encounter  Procedures  . EKG 12-Lead   Medication Changes: No orders of the defined types were placed in this encounter.   Signed, Richardson Dopp, PA-C  06/29/2019 5:15 PM    [facility] [address], [place], [place]  [address] Phone: [phone]; Fax: [phone]

## 2019-06-29 ENCOUNTER — Encounter: Payer: Self-pay | Admitting: Physician Assistant

## 2019-06-29 ENCOUNTER — Ambulatory Visit (INDEPENDENT_AMBULATORY_CARE_PROVIDER_SITE_OTHER): Payer: Medicare Other | Admitting: Physician Assistant

## 2019-06-29 ENCOUNTER — Other Ambulatory Visit: Payer: Self-pay

## 2019-06-29 VITALS — BP 102/60 | HR 115 | Ht 77.0 in | Wt 170.0 lb

## 2019-06-29 DIAGNOSIS — I071 Rheumatic tricuspid insufficiency: Secondary | ICD-10-CM | POA: Diagnosis not present

## 2019-06-29 DIAGNOSIS — I10 Essential (primary) hypertension: Secondary | ICD-10-CM

## 2019-06-29 DIAGNOSIS — I5042 Chronic combined systolic (congestive) and diastolic (congestive) heart failure: Secondary | ICD-10-CM

## 2019-06-29 DIAGNOSIS — I480 Paroxysmal atrial fibrillation: Secondary | ICD-10-CM | POA: Diagnosis not present

## 2019-06-29 DIAGNOSIS — Z9889 Other specified postprocedural states: Secondary | ICD-10-CM

## 2019-06-29 DIAGNOSIS — I50812 Chronic right heart failure: Secondary | ICD-10-CM

## 2019-06-29 DIAGNOSIS — I340 Nonrheumatic mitral (valve) insufficiency: Secondary | ICD-10-CM

## 2019-06-29 NOTE — Patient Instructions (Signed)
Medication Instructions:  none If you need a refill on your cardiac medications before your next appointment, please call your pharmacy.   Lab work: none If you have labs (blood work) drawn today and your tests are completely normal, you will receive your results only by: . MyChart Message (if you have MyChart) OR . A paper copy in the mail If you have any lab test that is abnormal or we need to change your treatment, we will call you to review the results.  Testing/Procedures: none  Follow-Up: At [facility], you and your health needs are our priority.  As part of our continuing mission to provide you with exceptional heart care, we have created designated Provider Care Teams.  These Care Teams include your primary Cardiologist (physician) and Advanced Practice Providers (APPs -  Physician Assistants and Nurse Practitioners) who all work together to provide you with the care you need, when you need it. You will need a follow up appointment in 6 months.  Please call our office 2 months in advance to schedule this appointment.  You may see Jayadeep Varanasi, MD or one of the following Advanced Practice Providers on your designated Care Team:   Brittainy Simmons, PA-C Dayna Dunn, PA-C . Michele Lenze, PA-C  Any Other Special Instructions Will Be Listed Below (If Applicable).    

## 2019-07-01 ENCOUNTER — Telehealth: Payer: Self-pay | Admitting: *Deleted

## 2019-07-01 NOTE — Telephone Encounter (Signed)
[name] [name]:  Spoke with [name] [name] at [place] [facility]. They received referral but they are at max patient capacity and cannot accept pt at this time. She states we will need to refer pt to another agency. I have left detailed message on pt's voicemail regarding referral status. Please place referral to new agency.  Copied from [place] [id number]. Topic: Referral - Status >> Jul 01, 2019  2:17 PM [name] [name] wrote: Pt states that he is calling because he was supposed to have a PT referral done, but hasn't heard anything and wants to know where this stand. Pt can be reached at [phone]

## 2019-07-04 ENCOUNTER — Ambulatory Visit (INDEPENDENT_AMBULATORY_CARE_PROVIDER_SITE_OTHER): Payer: Medicare Other | Admitting: *Deleted

## 2019-07-04 ENCOUNTER — Other Ambulatory Visit: Payer: Self-pay

## 2019-07-04 DIAGNOSIS — I4891 Unspecified atrial fibrillation: Secondary | ICD-10-CM

## 2019-07-04 DIAGNOSIS — Z5181 Encounter for therapeutic drug level monitoring: Secondary | ICD-10-CM | POA: Diagnosis not present

## 2019-07-04 LAB — POCT INR: INR: 2.2 (ref 2.0–3.0)

## 2019-07-04 NOTE — Telephone Encounter (Signed)
Looks like [facility] cannot take patient.  Can you try another agency or do we need to put in another referral?

## 2019-07-04 NOTE — Patient Instructions (Signed)
Description   Continue on same dosage 1/2 tablet daily except 1 tablet on Wednesdays.  Recheck in 5 weeks. Call [phone] if scheduled for any procedures or on any new medications

## 2019-07-06 NOTE — Telephone Encounter (Signed)
Left message on machine in detail that we had to send referral to different agency and where we sent it.

## 2019-07-06 NOTE — Telephone Encounter (Signed)
Please advise 

## 2019-07-06 NOTE — Telephone Encounter (Signed)
Pt want to know the status of his PT referral. Please call pt to advise

## 2019-07-06 NOTE — Telephone Encounter (Signed)
Sent referral to [facility] waiting on a confirmation that they can see the pt

## 2019-08-07 NOTE — Progress Notes (Signed)
Advanced Heart Failure Clinic Note   Date:  08/07/2019   ID:  [name] [name], DOB 08-23-1931, MRN [id number]  Location: Home  Provider location: [facility] Type of Visit: Established patient  PCP:  [name], MD  Cardiologist:  Larae Grooms, MD Primary HF: [place]  Chief Complaint: Heart Failure follow-up   History of Present Illness:  [name] [name] ([place]) is an 85 y.o. male with history of diastolic HF, PAF/Aflutter on coumadin, and h/o severe MV prolapse s/p MV repair 10/01/11.  He was admitted 04/26/17-04/30/17 with a fall, no syncope. Also with right sided heart failure. RHC showed normal cardiac output. No evidence of PAH. Diuresed with IV lasix, weight down about 10 pounds. Discharge weight was 190 pounds. He was discharged to SNF.   Admitted in 5/20 for RLL PNA. Also had some HF and diuresed 11 pounds. D/c weight was 162.   Here for routine f/u.  At last visit we cut amio back due to 1AVB and also changed torsemide from 20 bid to 40 daily.   Says he is feeling ok. Good days and bad days. Taking torsemide 40 daily alternating with 20 daily due to problems peeing too much. Weight stable around 168. Edema well controlled. Goes out for groceries 1x/week. No orthopnea or PND. No dizziness, syncope or presyncope.   Echo 5/20: EF 55-60% RV normal. RVSP 35 moderate to severe TR.   ECHO 09/04/2017  LVEF 55-60%  Severe TR, RV moderately to severely dilated. Flattened septum Echo 12/19/16 with LVEF 50-55%, Mild AI, s/p MVR with mild residual MR, Mod LAE, Mod RV dilation, severe RAE, Severe TR.  RHC 6/18 RA = 8 RV = 36/7 PA = 33/4 (15) PCW = 6 Fick cardiac output/index = 7.7/3.6 PVR = 1.2 WU Ao sat = 99% PA sat = 69%, 72% SVC sat = 69%      [name] [name] denies symptoms worrisome for COVID 19.   Past Medical History:  Diagnosis Date  . Abnormality of gait 05/27/2016  . Adenomatous polyps   . Carpal tunnel syndrome  06/25/2016   Right  . Depressive disorder, not elsewhere classified   . First degree AV block    Holter 3/18: NSR, PACs, PVCs, no AFib, no pauses.  Marland Kitchen Hereditary and idiopathic peripheral neuropathy 06/25/2016  . Hypertension   . Internal nasal lesion 05/15/2013  . Melanoma ([place])    Left Shoulder  . Mitral regurgitation   . MVP (mitral valve prolapse)    a. With severe MR s/p Complex valvuloplasty including artificial Gore-tex neochord placement x4, chordal transposition x1, chordal release x1, # 32 mm Sorin Memo 3D Ring Annuloplasty 2012. // b. Echo 2/18: mild LVH, EF 50-55, mild AI, MV repair with mild MR, mod LAE, mod RVE, severe RAE, severe TR  . Neuropathy   . Normal coronary arteries    a. Normal coronary anatomy by cath 2012.  . Osteoarthritis    Knees  . PAF (paroxysmal atrial fibrillation) ([place])    a. Post-op MVR 2012.  Marland Kitchen Personal history of colonic polyps   . Prostate cancer ([place])   . Pulmonary HTN ([place])    a. Mild-mod by cath 2012.  . Pure hypercholesterolemia   . PVC (premature ventricular contraction)   . Thrombocytopenia ([place])   . Vision abnormalities    Cornea scarring   Past Surgical History:  Procedure Laterality Date  . CARDIAC CATHETERIZATION  09/2011   Pre-op for MVR --  normal coronaries.  Marland Kitchen CARDIOVERSION N/A 01/02/2016   Procedure: CARDIOVERSION;  Surgeon: Thayer Headings, MD;  Location: [facility] ENDOSCOPY;  Service: Cardiovascular;  Laterality: N/A;  . COLONOSCOPY W/ POLYPECTOMY    . INGUINAL HERNIA REPAIR  09/2009   Left  . KNEE ARTHROSCOPY      left x3  and right x2  . Melanoma Surgery     2001, 2005, 2006, 2009  . MITRAL VALVE REPAIR  10/01/2011   complex valvuloplasty with Goretex cord replacement and chordal transposition 93mm Sorin Memo 3D ring annuloplasty  . Nuclear Stress Test  09/2006   EF-64%, Normal  . PROSTATECTOMY  1993  . RIGHT HEART CATH N/A 04/29/2017   Procedure: Right Heart Cath;  Surgeon: Jolaine Artist, MD;  Location: [facility]  LAB;  Service: Cardiovascular;  Laterality: N/A;  . ROOT CANAL  08-19-12  . ROTATOR CUFF REPAIR  2003   left  . TEE WITHOUT CARDIOVERSION  09/26/2011   Procedure: TRANSESOPHAGEAL ECHOCARDIOGRAM (TEE);  Surgeon: Lelon Perla, MD;  Location: [facility] ENDOSCOPY;  Service: Cardiovascular;  Laterality: N/A;  . US ECHOCARDIOGRAPHY  09/2009, 08/1011   mild LVH,mild AI,MVP with mild MR, mild-mod. TR with mild Pulm. HTN, EF-55-60%     Current Outpatient Medications  Medication Sig Dispense Refill  . acetaminophen (TYLENOL) 500 MG tablet Take 1,000 mg by mouth every 6 (six) hours as needed for mild pain.    Marland Kitchen amiodarone (PACERONE) 200 MG tablet Take 0.5 tablets (100 mg total) by mouth daily. 15 tablet 12  . b complex vitamins tablet Take 1 tablet by mouth daily.    . cycloSPORINE (RESTASIS) 0.05 % ophthalmic emulsion Place 1 drop into both eyes 2 (two) times daily.    . ferrous fumarate (HEMOCYTE - 106 MG FE) 325 (106 Fe) MG TABS tablet Take 1 tablet (106 mg of iron total) by mouth daily. 30 each 3  . fluticasone (FLONASE) 50 MCG/ACT nasal spray Place 2 sprays into both nostrils daily.   11  . gabapentin (NEURONTIN) 300 MG capsule TAKE 1 CAPSULE TWICE A DAY 180 capsule 4  . metoprolol succinate (TOPROL-XL) 50 MG 24 hr tablet TAKE 1 TABLET DAILY WITH OR IMMEDIATELY FOLLOWING A MEAL (Patient taking differently: Take 50 mg by mouth daily. ) 90 tablet 4  . Multiple Vitamin (MULTIVITAMIN WITH MINERALS) TABS tablet Take 1 tablet by mouth every evening.     . potassium chloride SA (K-DUR,KLOR-CON) 20 MEQ tablet Take 2 tablets (40 mEq total) by mouth daily. 180 tablet 3  . rosuvastatin (CRESTOR) 5 MG tablet TAKE 1 TABLET DAILY 90 tablet 3  . torsemide (DEMADEX) 20 MG tablet TAKE 2 TABLETS DAILY 180 tablet 3  . traZODone (DESYREL) 100 MG tablet Take 300 mg by mouth at bedtime.     Marland Kitchen warfarin (COUMADIN) 5 MG tablet TAKE 1 TABLET DAILY AS DIRECTED BY THE COUMADIN CLINIC 90 tablet 0   No current  facility-administered medications for this encounter.     Allergies:   Patient has no known allergies.   Social History:  The patient  reports that he has never smoked. He has never used smokeless tobacco. He reports that he does not drink alcohol or use drugs.   Family History:  The patient's family history includes Arthritis in his mother; Clotting disorder in his brother; Hypertension in his father and mother; Psychosis in his father; Stroke in his mother.   ROS:  Please see the history of present illness.   All other  systems are personally reviewed and negative.   Vitals:   08/08/19 1030  BP: 110/64  Pulse: (!) 59  SpO2: 98%  Weight: 76.7 kg (169 lb)    Exam:   General:  Elderly. Well appearing. No resp difficulty HEENT: normal Neck: supple. JVP 6  Carotids 2+ bilat; no bruits. No lymphadenopathy or thryomegaly appreciated. Cor: PMI nondisplaced. Regular rate & rhythm. Very soft TR. Lungs: clear Abdomen: soft, nontender, nondistended. No hepatosplenomegaly. No bruits or masses. Good bowel sounds. Extremities: no cyanosis, clubbing, rash, edema Chronic venous stasis changes.  Neuro: alert & orientedx3, cranial nerves grossly intact. moves all 4 extremities w/o difficulty. Affect pleasant   Recent Labs: 04/03/2019: Magnesium 2.3 04/05/2019: B Natriuretic Peptide 607.8 04/26/2019: Pro B Natriuretic peptide (BNP) 444.0 06/14/2019: Hemoglobin 13.2; Platelet Count 89 06/23/2019: ALT 13; BUN 23; Creatinine, Ser 1.15; Potassium 4.6; Sodium 142; TSH 1.15  Personally reviewed   Wt Readings from Last 3 Encounters:  06/29/19 77.1 kg (170 lb)  06/23/19 76.8 kg (169 lb 6.4 oz)  06/22/19 73.9 kg (163 lb)    ECG: Sb 59 1AVB (345ms) iRBBB. Personally reviewed   ASSESSMENT AND PLAN:  1. Combined diastolic HF with R>L symptoms in the setting of longstanding MV disease.  - Stable NYHA II. Volume status looks good.  - Continue torsemide 40 daily alternating with 20 daily. If he has swelling  take 40 daily until resolve - Check labs today - RTC in 2 months to evaluate.    2. PAF - Remains in NSR on amio 100 mg daily - Check labs - Continue warfarin for anticoagulation. No bleeding. Will not switch to DOAC with h/o MV repair  3. Severe TR - No change on recent echo - Not candidate for TVR due to age and comorbidities. Volume status well controlled with medical therapy.   4. [place] - RHC 6/18 without severe PAH.  (RA 8, PA 33/4 (15), CI 3.6)  5. Severe MVP S/P Bioprosthetic MVR  - Stable. - We discussed continue need for SBE prophylaxis  6. Marked 1AVB - Toprol and amio cut back.  - Follow closely for possible PPM need.  - Will d/c Toprol  7. Recurrent prostate CA - PSA up. Bone scan negative. - Urology contemplating Lupron. Ok from our standpoint.    Signed, [name], MD  08/07/2019 11:15 PM  [facility] [place] [address] [facility] and [place] [address] [phone] (office) [phone] (fax)

## 2019-08-08 ENCOUNTER — Encounter (HOSPITAL_COMMUNITY): Payer: Self-pay | Admitting: Internal Medicine

## 2019-08-08 ENCOUNTER — Ambulatory Visit (HOSPITAL_COMMUNITY)
Admission: RE | Admit: 2019-08-08 | Discharge: 2019-08-08 | Disposition: A | Discharge status: Home / Self Care | Payer: Medicare Other | Source: Ambulatory Visit | Attending: Internal Medicine | Admitting: Internal Medicine

## 2019-08-08 VITALS — BP 110/64 | HR 59 | Wt 169.0 lb

## 2019-08-08 DIAGNOSIS — Z8546 Personal history of malignant neoplasm of prostate: Secondary | ICD-10-CM | POA: Diagnosis not present

## 2019-08-08 DIAGNOSIS — C61 Malignant neoplasm of prostate: Secondary | ICD-10-CM | POA: Insufficient documentation

## 2019-08-08 DIAGNOSIS — I440 Atrioventricular block, first degree: Secondary | ICD-10-CM | POA: Insufficient documentation

## 2019-08-08 DIAGNOSIS — I110 Hypertensive heart disease with heart failure: Secondary | ICD-10-CM | POA: Insufficient documentation

## 2019-08-08 DIAGNOSIS — I081 Rheumatic disorders of both mitral and tricuspid valves: Secondary | ICD-10-CM | POA: Insufficient documentation

## 2019-08-08 DIAGNOSIS — I480 Paroxysmal atrial fibrillation: Secondary | ICD-10-CM | POA: Diagnosis not present

## 2019-08-08 DIAGNOSIS — Z9079 Acquired absence of other genital organ(s): Secondary | ICD-10-CM | POA: Diagnosis not present

## 2019-08-08 DIAGNOSIS — I071 Rheumatic tricuspid insufficiency: Secondary | ICD-10-CM

## 2019-08-08 DIAGNOSIS — Z79899 Other long term (current) drug therapy: Secondary | ICD-10-CM | POA: Insufficient documentation

## 2019-08-08 DIAGNOSIS — I4892 Unspecified atrial flutter: Secondary | ICD-10-CM | POA: Insufficient documentation

## 2019-08-08 DIAGNOSIS — M1710 Unilateral primary osteoarthritis, unspecified knee: Secondary | ICD-10-CM | POA: Insufficient documentation

## 2019-08-08 DIAGNOSIS — I2729 Other secondary pulmonary hypertension: Secondary | ICD-10-CM | POA: Diagnosis not present

## 2019-08-08 DIAGNOSIS — Z8249 Family history of ischemic heart disease and other diseases of the circulatory system: Secondary | ICD-10-CM | POA: Insufficient documentation

## 2019-08-08 DIAGNOSIS — R9431 Abnormal electrocardiogram [ECG] [EKG]: Secondary | ICD-10-CM | POA: Diagnosis not present

## 2019-08-08 DIAGNOSIS — I4510 Unspecified right bundle-branch block: Secondary | ICD-10-CM | POA: Insufficient documentation

## 2019-08-08 DIAGNOSIS — I5082 Biventricular heart failure: Secondary | ICD-10-CM | POA: Insufficient documentation

## 2019-08-08 DIAGNOSIS — I50812 Chronic right heart failure: Secondary | ICD-10-CM

## 2019-08-08 DIAGNOSIS — Z9889 Other specified postprocedural states: Secondary | ICD-10-CM

## 2019-08-08 DIAGNOSIS — E7800 Pure hypercholesterolemia, unspecified: Secondary | ICD-10-CM | POA: Insufficient documentation

## 2019-08-08 DIAGNOSIS — R001 Bradycardia, unspecified: Secondary | ICD-10-CM | POA: Insufficient documentation

## 2019-08-08 DIAGNOSIS — Z823 Family history of stroke: Secondary | ICD-10-CM | POA: Diagnosis not present

## 2019-08-08 DIAGNOSIS — Z953 Presence of xenogenic heart valve: Secondary | ICD-10-CM | POA: Diagnosis not present

## 2019-08-08 DIAGNOSIS — Z7901 Long term (current) use of anticoagulants: Secondary | ICD-10-CM | POA: Insufficient documentation

## 2019-08-08 DIAGNOSIS — Z8261 Family history of arthritis: Secondary | ICD-10-CM | POA: Insufficient documentation

## 2019-08-08 DIAGNOSIS — Z85820 Personal history of malignant melanoma of skin: Secondary | ICD-10-CM | POA: Diagnosis not present

## 2019-08-08 DIAGNOSIS — I5032 Chronic diastolic (congestive) heart failure: Secondary | ICD-10-CM

## 2019-08-08 LAB — COMPREHENSIVE METABOLIC PANEL
ALT: 18 U/L (ref 0–44)
AST: 26 U/L (ref 15–41)
Albumin: 4.1 g/dL (ref 3.5–5.0)
Alkaline Phosphatase: 51 U/L (ref 38–126)
Anion gap: 11 (ref 5–15)
BUN: 29 mg/dL — ABNORMAL HIGH (ref 8–23)
CO2: 25 mmol/L (ref 22–32)
Calcium: 9.8 mg/dL (ref 8.9–10.3)
Chloride: 103 mmol/L (ref 98–111)
Creatinine, Ser: 1.27 mg/dL — ABNORMAL HIGH (ref 0.61–1.24)
GFR calc Af Amer: 59 mL/min — ABNORMAL LOW (ref >60)
GFR calc non Af Amer: 51 mL/min — ABNORMAL LOW (ref >60)
Glucose, Bld: 83 mg/dL (ref 70–99)
Potassium: 4.9 mmol/L (ref 3.5–5.1)
Sodium: 139 mmol/L (ref 135–145)
Total Bilirubin: 0.7 mg/dL (ref 0.3–1.2)
Total Protein: 7.6 g/dL (ref 6.5–8.1)

## 2019-08-08 LAB — CBC
HCT: 44.7 % (ref 39.0–52.0)
Hemoglobin: 14.6 g/dL (ref 13.0–17.0)
MCH: 31.1 pg (ref 26.0–34.0)
MCHC: 32.7 g/dL (ref 30.0–36.0)
MCV: 95.3 fL (ref 80.0–100.0)
Platelets: 70 10*3/uL — ABNORMAL LOW (ref 150–400)
RBC: 4.69 MIL/uL (ref 4.22–5.81)
RDW: 14.1 % (ref 11.5–15.5)
WBC: 7 10*3/uL (ref 4.0–10.5)
nRBC: 0 % (ref 0.0–0.2)

## 2019-08-08 LAB — T4, FREE: Free T4: 1.24 ng/dL — ABNORMAL HIGH (ref 0.61–1.12)

## 2019-08-08 LAB — TSH: TSH: 1.387 u[IU]/mL (ref 0.350–4.500)

## 2019-08-08 NOTE — Addendum Note (Signed)
Encounter addended by: Valeda Malm, RN on: 08/08/2019 11:16 AM  Actions taken: Medication long-term status modified, Visit diagnoses modified, Order list changed, Diagnosis association updated, Clinical Note Signed

## 2019-08-08 NOTE — Patient Instructions (Signed)
STOP [place] today We will only contact you if something comes back abnormal or we need to make some changes. Otherwise no news is good news!  Your physician recommends that you schedule a follow-up appointment in: 4 months.  Our office will contact you to schedule your appointment closer to that time.   At the [facility], you and your health needs are our priority. As part of our continuing mission to provide you with exceptional heart care, we have created designated Provider Care Teams. These Care Teams include your primary Cardiologist (physician) and Advanced Practice Providers (APPs- Physician Assistants and Nurse Practitioners) who all work together to provide you with the care you need, when you need it.   You may see any of the following providers on your designated Care Team at your next follow up: Marland Kitchen Dr [name] . Dr Loralie Champagne . Darrick Grinder, NP   Please be sure to bring in all your medications bottles to every appointment.

## 2019-08-09 LAB — T3, FREE: T3, Free: 2.5 pg/mL (ref 2.0–4.4)

## 2019-08-10 ENCOUNTER — Telehealth: Payer: Self-pay | Admitting: Family Medicine

## 2019-08-10 NOTE — Telephone Encounter (Signed)
omara with [facility] would like verbal to extend home health PT  2 wk 1 1 wk 1  [phone]  Ok to leave confidential VM

## 2019-08-10 NOTE — Telephone Encounter (Signed)
Spoke w/ Claudette Laws- verbal orders given.

## 2019-08-11 ENCOUNTER — Ambulatory Visit (INDEPENDENT_AMBULATORY_CARE_PROVIDER_SITE_OTHER): Payer: Medicare Other | Admitting: *Deleted

## 2019-08-11 ENCOUNTER — Other Ambulatory Visit: Payer: Self-pay

## 2019-08-11 DIAGNOSIS — I4891 Unspecified atrial fibrillation: Secondary | ICD-10-CM | POA: Diagnosis not present

## 2019-08-11 DIAGNOSIS — Z5181 Encounter for therapeutic drug level monitoring: Secondary | ICD-10-CM | POA: Diagnosis not present

## 2019-08-11 LAB — POCT INR: INR: 2.2 (ref 2.0–3.0)

## 2019-08-11 NOTE — Patient Instructions (Addendum)
Description   Continue on same dosage 1/2 tablet daily except 1 tablet on Wednesdays.  Recheck in 6 weeks. Call [phone] if scheduled for any procedures or on any new medications

## 2019-09-08 ENCOUNTER — Encounter: Payer: Self-pay | Admitting: Medical Oncology

## 2019-09-08 NOTE — Progress Notes (Signed)
Patient had follow visit with Dr. Achilles Dunk with PSA check. His PSA remains stable and he has opted to defer radiation at this time. He will follow up with Dr. Avie Arenas.

## 2019-09-08 NOTE — Progress Notes (Signed)
Patient followed up with Dr. Catarina Hartshorn with PSA. PSA had decreased from 1.4 to 1.10. He will follow up with Dr. Achilles Dunk with PSA. Pending labs, he will make his decision on radiation.

## 2019-09-22 ENCOUNTER — Ambulatory Visit (INDEPENDENT_AMBULATORY_CARE_PROVIDER_SITE_OTHER): Payer: Medicare Other

## 2019-09-22 ENCOUNTER — Other Ambulatory Visit: Payer: Self-pay

## 2019-09-22 ENCOUNTER — Encounter (INDEPENDENT_AMBULATORY_CARE_PROVIDER_SITE_OTHER): Payer: Self-pay

## 2019-09-22 DIAGNOSIS — I4891 Unspecified atrial fibrillation: Secondary | ICD-10-CM

## 2019-09-22 DIAGNOSIS — Z5181 Encounter for therapeutic drug level monitoring: Secondary | ICD-10-CM

## 2019-09-22 LAB — POCT INR: INR: 1.9 — AB (ref 2.0–3.0)

## 2019-09-22 NOTE — Patient Instructions (Signed)
Description   Take 1 tablet today, then resume same dosage 1/2 tablet daily except 1 tablet on Wednesdays.  Recheck in 6 weeks. Call [phone] if scheduled for any procedures or on any new medications

## 2019-09-26 ENCOUNTER — Ambulatory Visit: Payer: Medicare Other | Admitting: Family Medicine

## 2019-09-28 ENCOUNTER — Other Ambulatory Visit: Payer: Self-pay

## 2019-09-29 ENCOUNTER — Other Ambulatory Visit: Payer: Self-pay | Admitting: Interventional Cardiology

## 2019-09-29 ENCOUNTER — Encounter: Payer: Self-pay | Admitting: Family Medicine

## 2019-09-29 ENCOUNTER — Other Ambulatory Visit: Payer: Self-pay

## 2019-09-29 ENCOUNTER — Other Ambulatory Visit: Payer: Self-pay | Admitting: Internal Medicine

## 2019-09-29 ENCOUNTER — Ambulatory Visit (INDEPENDENT_AMBULATORY_CARE_PROVIDER_SITE_OTHER): Payer: Medicare Other | Admitting: Family Medicine

## 2019-09-29 ENCOUNTER — Other Ambulatory Visit: Payer: Self-pay | Admitting: Family Medicine

## 2019-09-29 VITALS — BP 116/64 | HR 78 | Temp 98.0°F | Resp 18 | Wt 168.3 lb

## 2019-09-29 DIAGNOSIS — G629 Polyneuropathy, unspecified: Secondary | ICD-10-CM | POA: Diagnosis not present

## 2019-09-29 DIAGNOSIS — N6310 Unspecified lump in the right breast, unspecified quadrant: Secondary | ICD-10-CM

## 2019-09-29 DIAGNOSIS — I10 Essential (primary) hypertension: Secondary | ICD-10-CM

## 2019-09-29 DIAGNOSIS — N63 Unspecified lump in unspecified breast: Secondary | ICD-10-CM

## 2019-09-29 DIAGNOSIS — N289 Disorder of kidney and ureter, unspecified: Secondary | ICD-10-CM

## 2019-09-29 DIAGNOSIS — I5032 Chronic diastolic (congestive) heart failure: Secondary | ICD-10-CM

## 2019-09-29 DIAGNOSIS — M25562 Pain in left knee: Secondary | ICD-10-CM

## 2019-09-29 DIAGNOSIS — M25561 Pain in right knee: Secondary | ICD-10-CM

## 2019-09-29 DIAGNOSIS — G8929 Other chronic pain: Secondary | ICD-10-CM

## 2019-09-29 DIAGNOSIS — I480 Paroxysmal atrial fibrillation: Secondary | ICD-10-CM

## 2019-09-29 LAB — COMPREHENSIVE METABOLIC PANEL
ALT: 19 U/L (ref 0–53)
AST: 20 U/L (ref 0–37)
Albumin: 4.2 g/dL (ref 3.5–5.2)
Alkaline Phosphatase: 50 U/L (ref 39–117)
BUN: 25 mg/dL — ABNORMAL HIGH (ref 6–23)
CO2: 32 mEq/L (ref 19–32)
Calcium: 9.7 mg/dL (ref 8.4–10.5)
Chloride: 103 mEq/L (ref 96–112)
Creatinine, Ser: 1.22 mg/dL (ref 0.40–1.50)
GFR: 56.26 mL/min — ABNORMAL LOW (ref >60.00)
Glucose, Bld: 104 mg/dL — ABNORMAL HIGH (ref 70–99)
Potassium: 4.9 mEq/L (ref 3.5–5.1)
Sodium: 141 mEq/L (ref 135–145)
Total Bilirubin: 0.7 mg/dL (ref 0.2–1.2)
Total Protein: 7.2 g/dL (ref 6.0–8.3)

## 2019-09-29 LAB — VITAMIN B12: Vitamin B-12: 1030 pg/mL — ABNORMAL HIGH (ref 211–911)

## 2019-09-29 NOTE — Patient Instructions (Addendum)
Want oxygen in the 90s  Want 60 to 80 ounces of non caffeine liquids daily to protect your kidneys Chronic Kidney Disease, Adult Chronic kidney disease (CKD) occurs when the kidneys become damaged slowly over a long period of time. The kidneys are a pair of organs that do many important jobs in the body, including:  Removing waste and extra fluid from the blood to make urine.  Making hormones that maintain the amount of fluid in tissues and blood vessels.  Maintaining the right amount of fluids and chemicals in the body. A small amount of kidney damage may not cause problems, but a large amount of damage may make it hard or impossible for the kidneys to work the way they should. If steps are not taken to slow down kidney damage or to stop it from getting worse, the kidneys may stop working permanently (end-stage renal disease or ESRD). Most of the time, CKD does not go away, but it can often be controlled. People who have CKD are usually able to live normal lives. What are the causes? The most common causes of this condition are diabetes and high blood pressure (hypertension). Other causes include:  Heart and blood vessel (cardiovascular) disease.  Kidney diseases, such as: ? Glomerulonephritis. ? Interstitial nephritis. ? Polycystic kidney disease. ? Renal vascular disease.  Diseases that affect the immune system.  Genetic diseases.  Medicines that damage the kidneys, such as anti-inflammatory medicines.  Being around or being in contact with poisonous (toxic) substances.  A kidney or urinary infection that occurs again and again (recurs).  Vasculitis. This is swelling or inflammation of the blood vessels.  A problem with urine flow that may be caused by: ? Cancer. ? Having kidney stones more than one time. ? An enlarged prostate, in males. What increases the risk? You are more likely to develop this condition if you:  Are older than age 33.  Are male.  Are  African-American, Hispanic, Asian, [place], or American [place].  Are a current or former smoker.  Are obese.  Have a family history of kidney disease or failure.  Often take medicines that are damaging to the kidneys. What are the signs or symptoms? Symptoms of this condition include:  Swelling (edema) of the face, legs, ankles, or feet.  Tiredness (lethargy) and having less energy.  Nausea or vomiting.  Confusion or trouble concentrating.  Problems with urination, such as: ? Painful or burning feeling during urination. ? Decreased urine production. ? Frequent urination, especially at night. ? Bloody urine.  Muscle twitches and cramps, especially in the legs.  Shortness of breath.  Weakness.  Loss of appetite.  Metallic taste in the mouth.  Trouble sleeping.  Dry, itchy skin.  A low blood count (anemia).  Pale lining of the eyelids and surface of the eye (conjunctiva). Symptoms develop slowly and may not be obvious until the kidney damage becomes severe. It is possible to have kidney disease for years without having any symptoms. How is this diagnosed? This condition may be diagnosed based on:  Blood tests.  Urine tests.  Imaging tests, such as an ultrasound or CT scan.  A test in which a sample of tissue is removed from the kidneys to be examined under a microscope (kidney biopsy). These test results will help your health care provider determine how serious the CKD is. How is this treated? There is no cure for most cases of this condition, but treatment usually relieves symptoms and prevents or slows the progression  of the disease. Treatment may include:  Making diet changes, which may require you to avoid alcohol, salty foods (sodium), and foods that are high in potassium, calcium, and protein.  Medicines: ? To lower blood pressure. ? To control blood glucose. ? To relieve anemia. ? To relieve swelling. ? To protect your bones. ? To improve  the balance of electrolytes in your blood.  Removing toxic waste from the body through types of dialysis, if the kidneys can no longer do their job (kidney failure).  Managing any other conditions that are causing your CKD or making it worse. Follow these instructions at home: Medicines  Take over-the-counter and prescription medicines only as told by your health care provider. The dose of some medicines that you take may need to be adjusted.  Do not take any new medicines unless approved by your health care provider. Many medicines can worsen your kidney damage.  Do not take any vitamin and mineral supplements unless approved by your health care provider. Many nutritional supplements can worsen your kidney damage. General instructions  Follow your prescribed diet as told by your health care provider.  Do not use any products that contain nicotine or tobacco, such as cigarettes and e-cigarettes. If you need help quitting, ask your health care provider.  Monitor and track your blood pressure at home. Report changes in your blood pressure as told by your health care provider.  If you are being treated for diabetes, monitor and track your blood sugar (blood glucose) levels as told by your health care provider.  Maintain a healthy weight. If you need help with this, ask your health care provider.  Start or continue an exercise plan. Exercise at least 30 minutes a day, 5 days a week.  Keep your immunizations up to date as told by your health care provider.  Keep all follow-up visits as told by your health care provider. This is important. Where to find more information  American Association of Kidney Patients: BombTimer.gl  National Kidney Foundation: www.kidney.[place]: https://mathis.com/  Life Options Rehabilitation Program: www.lifeoptions.org and www.kidneyschool.org Contact a health care provider if:  Your symptoms get worse.  You develop new symptoms. Get  help right away if:  You develop symptoms of ESRD, which include: ? Headaches. ? Numbness in the hands or feet. ? Easy bruising. ? Frequent hiccups. ? Chest pain. ? Shortness of breath. ? Lack of menstruation, in women.  You have a fever.  You have decreased urine production.  You have pain or bleeding when you urinate. Summary  Chronic kidney disease (CKD) occurs when the kidneys become damaged slowly over a long period of time.  The most common causes of this condition are diabetes and high blood pressure (hypertension).  There is no cure for most cases of this condition, but treatment usually relieves symptoms and prevents or slows the progression of the disease. Treatment may include a combination of medicines and lifestyle changes. This information is not intended to replace advice given to you by your health care provider. Make sure you discuss any questions you have with your health care provider. Document Released: 08/12/2008 Document Revised: 10/16/2017 Document Reviewed: 12/11/2016 Elsevier Patient Education  2020 [employer].

## 2019-10-02 NOTE — Assessment & Plan Note (Signed)
Right sided. Firm, irregular in shape, nontender. Ultrasound is ordered to further investigate.

## 2019-10-02 NOTE — Assessment & Plan Note (Signed)
No recent exacerbation 

## 2019-10-02 NOTE — Assessment & Plan Note (Signed)
Is very immobile at this time. Encouraged moist heat and gentle stretching as tolerated. May try NSAIDs and prescription meds as directed and report if symptoms worsen or seek immediate care

## 2019-10-02 NOTE — Progress Notes (Signed)
Subjective:    Patient ID: [name] [name], male    DOB: 1931/04/05, 85 y.o.   MRN: [id number]  No chief complaint on file.   HPI Patient is in today for follow up on chronic medical concerns including hypertension, atrial fibrillation, knee pain and more. No recent febrile illness or hospitalizations. He has noted a lump in his right breast for about a month now. Has not changed since he noted it, no skin lesions or discharge. Is noting some burning in his numbness and burning in his feet but this is not changing. Denies CP/palp/SOB/HA/congestion/fevers/GI or GU c/o. Taking meds as prescribed  Past Medical History:  Diagnosis Date  . Abnormality of gait 05/27/2016  . Adenomatous polyps   . Carpal tunnel syndrome 06/25/2016   Right  . Depressive disorder, not elsewhere classified   . First degree AV block    Holter 3/18: NSR, PACs, PVCs, no AFib, no pauses.  Marland Kitchen Hereditary and idiopathic peripheral neuropathy 06/25/2016  . Hypertension   . Internal nasal lesion 05/15/2013  . Melanoma ([place])    Left Shoulder  . Mitral regurgitation   . MVP (mitral valve prolapse)    a. With severe MR s/p Complex valvuloplasty including artificial Gore-tex neochord placement x4, chordal transposition x1, chordal release x1, # 32 mm Sorin Memo 3D Ring Annuloplasty 2012. // b. Echo 2/18: mild LVH, EF 50-55, mild AI, MV repair with mild MR, mod LAE, mod RVE, severe RAE, severe TR  . Neuropathy   . Normal coronary arteries    a. Normal coronary anatomy by cath 2012.  . Osteoarthritis    Knees  . PAF (paroxysmal atrial fibrillation) ([place])    a. Post-op MVR 2012.  Marland Kitchen Personal history of colonic polyps   . Prostate cancer ([place])   . Pulmonary HTN ([place])    a. Mild-mod by cath 2012.  . Pure hypercholesterolemia   . PVC (premature ventricular contraction)   . Thrombocytopenia ([place])   . Vision abnormalities    Cornea scarring    Past Surgical History:  Procedure Laterality Date  . CARDIAC CATHETERIZATION   09/2011   Pre-op for MVR -- normal coronaries.  Marland Kitchen CARDIOVERSION N/A 01/02/2016   Procedure: CARDIOVERSION;  Surgeon: Thayer Headings, MD;  Location: [facility] ENDOSCOPY;  Service: Cardiovascular;  Laterality: N/A;  . COLONOSCOPY W/ POLYPECTOMY    . INGUINAL HERNIA REPAIR  09/2009   Left  . KNEE ARTHROSCOPY      left x3  and right x2  . Melanoma Surgery     2001, 2005, 2006, 2009  . MITRAL VALVE REPAIR  10/01/2011   complex valvuloplasty with Goretex cord replacement and chordal transposition 46mm Sorin Memo 3D ring annuloplasty  . Nuclear Stress Test  09/2006   EF-64%, Normal  . PROSTATECTOMY  1993  . RIGHT HEART CATH N/A 04/29/2017   Procedure: Right Heart Cath;  Surgeon: Jolaine Artist, MD;  Location: [facility];  Service: Cardiovascular;  Laterality: N/A;  . ROOT CANAL  08-19-12  . ROTATOR CUFF REPAIR  2003   left  . TEE WITHOUT CARDIOVERSION  09/26/2011   Procedure: TRANSESOPHAGEAL ECHOCARDIOGRAM (TEE);  Surgeon: Lelon Perla, MD;  Location: [facility] ENDOSCOPY;  Service: Cardiovascular;  Laterality: N/A;  . US ECHOCARDIOGRAPHY  09/2009, 08/1011   mild LVH,mild AI,MVP with mild MR, mild-mod. TR with mild Pulm. HTN, EF-55-60%    Family History  Problem Relation Age of Onset  . Clotting disorder Brother  CVA's  . Arthritis Mother   . Hypertension Mother   . Stroke Mother   . Hypertension Father   . Psychosis Father        psychiatric care  . Colon cancer Neg Hx   . Stomach cancer Neg Hx   . Heart attack Neg Hx   . Prostate cancer Neg Hx   . Pancreatic cancer Neg Hx     Social History   Socioeconomic History  . Marital status: Widowed    Spouse name: Not on file  . Number of children: 2  . Years of education: 73  . Highest education level: Not on file  Occupational History    Comment: retired  Scientific laboratory technician  . Financial resource strain: Not on file  . Food insecurity    Worry: Not on file    Inability: Not on file  . Transportation needs    Medical:  Not on file    Non-medical: Not on file  Tobacco Use  . Smoking status: Never Smoker  . Smokeless tobacco: Never Used  Substance and Sexual Activity  . Alcohol use: No    Alcohol/week: 0.0 standard drinks    Comment: Last drink in 2000  . Drug use: No  . Sexual activity: Not Currently  Lifestyle  . Physical activity    Days per week: Not on file    Minutes per session: Not on file  . Stress: Not on file  Relationships  . Social Herbalist on phone: Not on file    Gets together: Not on file    Attends religious service: Not on file    Active member of club or organization: Not on file    Attends meetings of clubs or organizations: Not on file    Relationship status: Not on file  . Intimate partner violence    Fear of current or ex partner: Not on file    Emotionally abused: Not on file    Physically abused: Not on file    Forced sexual activity: Not on file  Other Topics Concern  . Not on file  Social History Narrative   Retired - Optometrist   Widower   2 children (one in [place] and on one in [place])    Drinks 1 cup of coffee per day    Outpatient Medications Prior to Visit  Medication Sig Dispense Refill  . acetaminophen (TYLENOL) 500 MG tablet Take 1,000 mg by mouth every 6 (six) hours as needed for mild pain.    Marland Kitchen amiodarone (PACERONE) 200 MG tablet Take 0.5 tablets (100 mg total) by mouth daily. 15 tablet 12  . b complex vitamins tablet Take 1 tablet by mouth daily.    . cycloSPORINE (RESTASIS) 0.05 % ophthalmic emulsion Place 1 drop into both eyes 2 (two) times daily.    . ferrous fumarate (HEMOCYTE - 106 MG FE) 325 (106 Fe) MG TABS tablet Take 1 tablet (106 mg of iron total) by mouth daily. 30 each 3  . fluticasone (FLONASE) 50 MCG/ACT nasal spray Place 2 sprays into both nostrils daily.   11  . gabapentin (NEURONTIN) 300 MG capsule TAKE 1 CAPSULE TWICE A DAY 180 capsule 4  . Multiple Vitamin (MULTIVITAMIN WITH MINERALS) TABS tablet Take 1 tablet by  mouth every evening.     . rosuvastatin (CRESTOR) 5 MG tablet TAKE 1 TABLET DAILY 90 tablet 3  . torsemide (DEMADEX) 20 MG tablet TAKE 2 TABLETS DAILY 180 tablet 3  .  traZODone (DESYREL) 100 MG tablet Take 300 mg by mouth at bedtime.     . potassium chloride SA (K-DUR,KLOR-CON) 20 MEQ tablet Take 2 tablets (40 mEq total) by mouth daily. 180 tablet 3  . warfarin (COUMADIN) 5 MG tablet TAKE 1 TABLET DAILY AS DIRECTED BY THE COUMADIN CLINIC 90 tablet 0   No facility-administered medications prior to visit.     No Known Allergies  Review of Systems  Constitutional: Negative for fever and malaise/fatigue.  HENT: Negative for congestion.   Eyes: Negative for blurred vision.  Respiratory: Negative for shortness of breath.   Cardiovascular: Negative for chest pain, palpitations and leg swelling.  Gastrointestinal: Negative for abdominal pain, blood in stool and nausea.  Genitourinary: Negative for dysuria and frequency.  Musculoskeletal: Positive for joint pain. Negative for falls.  Skin: Negative for rash.  Neurological: Positive for sensory change. Negative for dizziness, loss of consciousness and headaches.  Endo/Heme/Allergies: Negative for environmental allergies.  Psychiatric/Behavioral: Negative for depression. The patient is not nervous/anxious.        Objective:    Physical Exam Vitals signs and nursing note reviewed.  Constitutional:      General: He is not in acute distress.    Appearance: He is well-developed.  HENT:     Head: Normocephalic and atraumatic.     Nose: Nose normal.  Eyes:     General:        Right eye: No discharge.        Left eye: No discharge.  Neck:     Musculoskeletal: Normal range of motion and neck supple.  Cardiovascular:     Rate and Rhythm: Normal rate. Rhythm irregular.     Heart sounds: No murmur.  Pulmonary:     Effort: Pulmonary effort is normal.     Breath sounds: Normal breath sounds.  Abdominal:     General: Bowel sounds are  normal.     Palpations: Abdomen is soft.     Tenderness: There is no abdominal tenderness.  Skin:    General: Skin is warm and dry.  Neurological:     Mental Status: He is alert and oriented to person, place, and time.     BP 116/64 (BP Location: Left Arm, Patient Position: Sitting, Cuff Size: Normal)   Pulse 78   Temp 98 F (36.7 C) (Temporal)   Resp 18   Wt 168 lb 4.8 oz (76.3 kg)   SpO2 98%   BMI 19.96 kg/m  Wt Readings from Last 3 Encounters:  09/29/19 168 lb 4.8 oz (76.3 kg)  08/08/19 169 lb (76.7 kg)  06/29/19 170 lb (77.1 kg)    Diabetic Foot Exam - Simple   No data filed     Lab Results  Component Value Date   WBC 7.0 08/08/2019   HGB 14.6 08/08/2019   HCT 44.7 08/08/2019   PLT 70 (L) 08/08/2019   GLUCOSE 104 (H) 09/29/2019   CHOL 111 06/23/2019   TRIG 92.0 06/23/2019   HDL 41.10 06/23/2019   LDLCALC 51 06/23/2019   ALT 19 09/29/2019   AST 20 09/29/2019   NA 141 09/29/2019   K 4.9 09/29/2019   CL 103 09/29/2019   CREATININE 1.22 09/29/2019   BUN 25 (H) 09/29/2019   CO2 32 09/29/2019   TSH 1.387 08/08/2019   PSA 0.07 (L) 09/17/2010   INR 1.9 (A) 09/22/2019   HGBA1C 6.0 (H) 04/03/2014    Lab Results  Component Value Date   TSH 1.387 08/08/2019  Lab Results  Component Value Date   WBC 7.0 08/08/2019   HGB 14.6 08/08/2019   HCT 44.7 08/08/2019   MCV 95.3 08/08/2019   PLT 70 (L) 08/08/2019   Lab Results  Component Value Date   NA 141 09/29/2019   K 4.9 09/29/2019   CO2 32 09/29/2019   GLUCOSE 104 (H) 09/29/2019   BUN 25 (H) 09/29/2019   CREATININE 1.22 09/29/2019   BILITOT 0.7 09/29/2019   ALKPHOS 50 09/29/2019   AST 20 09/29/2019   ALT 19 09/29/2019   PROT 7.2 09/29/2019   ALBUMIN 4.2 09/29/2019   CALCIUM 9.7 09/29/2019   ANIONGAP 11 08/08/2019   GFR 56.26 (L) 09/29/2019   Lab Results  Component Value Date   CHOL 111 06/23/2019   Lab Results  Component Value Date   HDL 41.10 06/23/2019   Lab Results  Component Value  Date   LDLCALC 51 06/23/2019   Lab Results  Component Value Date   TRIG 92.0 06/23/2019   Lab Results  Component Value Date   CHOLHDL 3 06/23/2019   Lab Results  Component Value Date   HGBA1C 6.0 (H) 04/03/2014       Assessment & Plan:   Problem List Items Addressed This Visit    Atrial fibrillation (HCC) (Chronic)    Rate controlled and tolerating Coumadin      Chronic diastolic heart failure (HCC) (Chronic)    No recent exacerbation.       HYPERTENSION, BENIGN ESSENTIAL    Well controlled, no changes to meds. Encouraged heart healthy diet such as the DASH diet and exercise as tolerated.       Knee pain, bilateral    Is very immobile at this time. Encouraged moist heat and gentle stretching as tolerated. May try NSAIDs and prescription meds as directed and report if symptoms worsen or seek immediate care      Breast mass in male    Right sided. Firm, irregular in shape, nontender. Ultrasound is ordered to further investigate.        Other Visit Diagnoses    Breast mass, right    -  Primary   Relevant Orders   US BREAST LTD UNI RIGHT INC AXILLA   Neuropathy       Relevant Orders   Vitamin B12 (Completed)   Renal insufficiency       Relevant Orders   CMP (Completed)      I am having Meta Hatchet. Olberding maintain his traZODone, multivitamin with minerals, cycloSPORINE, fluticasone, amiodarone, gabapentin, ferrous fumarate, b complex vitamins, acetaminophen, rosuvastatin, and torsemide.  No orders of the defined types were placed in this encounter.    Penni Homans, MD

## 2019-10-02 NOTE — Assessment & Plan Note (Signed)
Well controlled, no changes to meds. Encouraged heart healthy diet such as the DASH diet and exercise as tolerated.  °

## 2019-10-02 NOTE — Assessment & Plan Note (Signed)
Rate controlled and tolerating Coumadin 

## 2019-10-05 ENCOUNTER — Ambulatory Visit: Payer: Medicare Other

## 2019-10-05 ENCOUNTER — Other Ambulatory Visit: Payer: Self-pay

## 2019-10-05 ENCOUNTER — Ambulatory Visit
Admission: RE | Admit: 2019-10-05 | Discharge: 2019-10-05 | Disposition: A | Discharge status: Home / Self Care | Payer: Medicare Other | Source: Ambulatory Visit | Attending: Family Medicine | Admitting: Family Medicine

## 2019-10-05 DIAGNOSIS — N6310 Unspecified lump in the right breast, unspecified quadrant: Secondary | ICD-10-CM

## 2019-10-17 ENCOUNTER — Inpatient Hospital Stay: Payer: Medicare Other | Admitting: Family

## 2019-10-17 ENCOUNTER — Inpatient Hospital Stay: Payer: Medicare Other

## 2019-11-03 ENCOUNTER — Other Ambulatory Visit: Payer: Self-pay

## 2019-11-03 ENCOUNTER — Ambulatory Visit (INDEPENDENT_AMBULATORY_CARE_PROVIDER_SITE_OTHER): Payer: Medicare Other | Admitting: *Deleted

## 2019-11-03 DIAGNOSIS — I4891 Unspecified atrial fibrillation: Secondary | ICD-10-CM | POA: Diagnosis not present

## 2019-11-03 DIAGNOSIS — Z5181 Encounter for therapeutic drug level monitoring: Secondary | ICD-10-CM

## 2019-11-03 LAB — POCT INR: INR: 1.7 — AB (ref 2.0–3.0)

## 2019-11-03 NOTE — Patient Instructions (Signed)
Description   Take 1 tablet today, then start taking 1/2 tablet daily except 1 tablet on Mondays and Wednesdays.  Recheck in 3 weeks. Call [phone] if scheduled for any procedures or on any new medications

## 2019-11-13 ENCOUNTER — Other Ambulatory Visit: Payer: Self-pay | Admitting: Cardiology

## 2019-11-16 ENCOUNTER — Encounter: Payer: Self-pay | Admitting: Family

## 2019-11-16 ENCOUNTER — Inpatient Hospital Stay (HOSPITAL_BASED_OUTPATIENT_CLINIC_OR_DEPARTMENT_OTHER): Payer: Medicare Other | Admitting: Family

## 2019-11-16 ENCOUNTER — Other Ambulatory Visit: Payer: Self-pay

## 2019-11-16 ENCOUNTER — Inpatient Hospital Stay: Payer: Medicare Other | Attending: Family

## 2019-11-16 VITALS — BP 98/55 | HR 71 | Temp 97.7°F | Resp 18 | Ht 77.0 in | Wt 172.0 lb

## 2019-11-16 DIAGNOSIS — D693 Immune thrombocytopenic purpura: Secondary | ICD-10-CM | POA: Diagnosis not present

## 2019-11-16 DIAGNOSIS — D508 Other iron deficiency anemias: Secondary | ICD-10-CM | POA: Diagnosis not present

## 2019-11-16 DIAGNOSIS — Z79899 Other long term (current) drug therapy: Secondary | ICD-10-CM | POA: Insufficient documentation

## 2019-11-16 DIAGNOSIS — D696 Thrombocytopenia, unspecified: Secondary | ICD-10-CM

## 2019-11-16 DIAGNOSIS — G629 Polyneuropathy, unspecified: Secondary | ICD-10-CM | POA: Diagnosis not present

## 2019-11-16 LAB — CMP (CANCER CENTER ONLY)
ALT: 16 U/L (ref 0–44)
AST: 18 U/L (ref 15–41)
Albumin: 4 g/dL (ref 3.5–5.0)
Alkaline Phosphatase: 50 U/L (ref 38–126)
Anion gap: 6 (ref 5–15)
BUN: 30 mg/dL — ABNORMAL HIGH (ref 8–23)
CO2: 32 mmol/L (ref 22–32)
Calcium: 10.1 mg/dL (ref 8.9–10.3)
Chloride: 103 mmol/L (ref 98–111)
Creatinine: 1.15 mg/dL (ref 0.61–1.24)
GFR, Est AFR Am: >60 mL/min (ref >60)
GFR, Estimated: 57 mL/min — ABNORMAL LOW (ref >60)
Glucose, Bld: 92 mg/dL (ref 70–99)
Potassium: 4.9 mmol/L (ref 3.5–5.1)
Sodium: 141 mmol/L (ref 135–145)
Total Bilirubin: 0.6 mg/dL (ref 0.3–1.2)
Total Protein: 7 g/dL (ref 6.5–8.1)

## 2019-11-16 LAB — CBC WITH DIFFERENTIAL (CANCER CENTER ONLY)
Abs Immature Granulocytes: 0.07 10*3/uL (ref 0.00–0.07)
Basophils Absolute: 0 10*3/uL (ref 0.0–0.1)
Basophils Relative: 0 %
Eosinophils Absolute: 0 10*3/uL (ref 0.0–0.5)
Eosinophils Relative: 0 %
HCT: 43.3 % (ref 39.0–52.0)
Hemoglobin: 14.2 g/dL (ref 13.0–17.0)
Immature Granulocytes: 1 %
Lymphocytes Relative: 20 %
Lymphs Abs: 1.2 10*3/uL (ref 0.7–4.0)
MCH: 30.7 pg (ref 26.0–34.0)
MCHC: 32.8 g/dL (ref 30.0–36.0)
MCV: 93.7 fL (ref 80.0–100.0)
Monocytes Absolute: 1.1 10*3/uL — ABNORMAL HIGH (ref 0.1–1.0)
Monocytes Relative: 18 %
Neutro Abs: 3.7 10*3/uL (ref 1.7–7.7)
Neutrophils Relative %: 61 %
Platelet Count: 73 10*3/uL — ABNORMAL LOW (ref 150–400)
RBC: 4.62 MIL/uL (ref 4.22–5.81)
RDW: 12.7 % (ref 11.5–15.5)
WBC Count: 6.2 10*3/uL (ref 4.0–10.5)
nRBC: 0 % (ref 0.0–0.2)

## 2019-11-16 LAB — SAVE SMEAR(SSMR), FOR PROVIDER SLIDE REVIEW

## 2019-11-16 LAB — RETICULOCYTES
Immature Retic Fract: 7.5 % (ref 2.3–15.9)
RBC.: 4.55 MIL/uL (ref 4.22–5.81)
Retic Count, Absolute: 70.1 10*3/uL (ref 19.0–186.0)
Retic Ct Pct: 1.5 % (ref 0.4–3.1)

## 2019-11-16 NOTE — Progress Notes (Signed)
Hematology and Oncology Follow Up Visit  REGAL STANIFER [id number] 1930/12/12 85 y.o. 11/16/2019   Principle Diagnosis:  Chronic thrombocytopenia-immune-based versus medication  Current Therapy:   Observation   Interim History:  Mr. Hibberd is here today for follow-up. He is doing quite well but has issues with the neuropathy in his feet and hands. He is currently on low dose Neurontin and plans to speak with his orthopedist Dr. Magnus Sinning about increasing his dose.  He uses a walker when ambulating for added support and has not had any recent falls. No syncopal episodes to report.  Platelet count is stable at 73. Hgb 14.2, WBC count 6.2.  No episodes of bleeding. His skin on the arms is thin and he does bruise easily but not in excess.  No fever, chills, n/v, cough, rash, dizziness, SOB, chest pain, palpitations, abdominal pain or changes in bowel or bladder habits.  No swelling or tenderness in his extremities at this time. He takes a diuretic regularly to reduce fluid retention.  He is eating well and hydrating. His weight is stable.   ECOG Performance Status: 1 - Symptomatic but completely ambulatory  Medications:  Allergies as of 11/16/2019   No Known Allergies     Medication List       Accurate as of November 16, 2019 11:21 AM. If you have any questions, ask your nurse or doctor.        acetaminophen 500 MG tablet Commonly known as: TYLENOL Take 1,000 mg by mouth every 6 (six) hours as needed for mild pain.   amiodarone 200 MG tablet Commonly known as: PACERONE TAKE 1 TABLET DAILY   b complex vitamins tablet Take 1 tablet by mouth daily.   cycloSPORINE 0.05 % ophthalmic emulsion Commonly known as: RESTASIS Place 1 drop into both eyes 2 (two) times daily.   ferrous fumarate 325 (106 Fe) MG Tabs tablet Commonly known as: HEMOCYTE - 106 mg FE Take 1 tablet (106 mg of iron total) by mouth daily.   fluticasone 50 MCG/ACT nasal spray Commonly known as:  FLONASE Place 2 sprays into both nostrils daily.   gabapentin 300 MG capsule Commonly known as: NEURONTIN TAKE 1 CAPSULE TWICE A DAY   Klor-Con M20 20 MEQ tablet Generic drug: potassium chloride SA TAKE 2 TABLETS DAILY   multivitamin with minerals Tabs tablet Take 1 tablet by mouth every evening.   rosuvastatin 5 MG tablet Commonly known as: CRESTOR TAKE 1 TABLET DAILY   torsemide 20 MG tablet Commonly known as: DEMADEX TAKE 2 TABLETS DAILY   traZODone 100 MG tablet Commonly known as: DESYREL Take 300 mg by mouth at bedtime.   warfarin 5 MG tablet Commonly known as: COUMADIN Take as directed by the anticoagulation clinic. If you are unsure how to take this medication, talk to your nurse or doctor. Original instructions: TAKE 1 TABLET DAILY AS DIRECTED BY THE COUMADIN CLINIC       Allergies: No Known Allergies  Past Medical History, Surgical history, Social history, and Family History were reviewed and updated.  Review of Systems: All other 10 point review of systems is negative.   Physical Exam:  height is 6\' 5"  (1.956 m) and weight is 172 lb (78 kg). His temporal temperature is 97.7 F (36.5 C). His blood pressure is 98/55 (abnormal) and his pulse is 71. His respiration is 18 and oxygen saturation is 97%.   Wt Readings from Last 3 Encounters:  11/16/19 172 lb (78 kg)  09/29/19 168 lb  4.8 oz (76.3 kg)  08/08/19 169 lb (76.7 kg)    Ocular: Sclerae unicteric, pupils equal, round and reactive to light Ear-nose-throat: Oropharynx clear, dentition fair Lymphatic: No cervical or supraclavicular adenopathy Lungs no rales or rhonchi, good excursion bilaterally Heart regular rate and rhythm, no murmur appreciated Abd soft, nontender, positive bowel sounds, no liver or spleen tip palpated on exam, no fluid wave  MSK no focal spinal tenderness, no joint edema Neuro: non-focal, well-oriented, appropriate affect Breasts: Deferred   Lab Results  Component Value Date    WBC 6.2 11/16/2019   HGB 14.2 11/16/2019   HCT 43.3 11/16/2019   MCV 93.7 11/16/2019   PLT 73 (L) 11/16/2019   Lab Results  Component Value Date   FERRITIN 53 06/14/2019   IRON 116 06/14/2019   TIBC 330 06/14/2019   UIBC 214 06/14/2019   IRONPCTSAT 35 06/14/2019   Lab Results  Component Value Date   RETICCTPCT 1.5 11/16/2019   RBC 4.55 11/16/2019   No results found for: KPAFRELGTCHN, LAMBDASER, [facility] Lab Results  Component Value Date   IGGSERUM 901 05/27/2016   IGMSERUM 422 (H) 05/27/2016   No results found for: Odetta Pink, SPEI   Chemistry      Component Value Date/Time   NA 141 11/16/2019 1028   NA 143 02/23/2017 1103   K 4.9 11/16/2019 1028   CL 103 11/16/2019 1028   CO2 32 11/16/2019 1028   BUN 30 (H) 11/16/2019 1028   BUN 25 02/23/2017 1103   CREATININE 1.15 11/16/2019 1028   CREATININE 1.06 08/25/2016 1339      Component Value Date/Time   CALCIUM 10.1 11/16/2019 1028   ALKPHOS 50 11/16/2019 1028   AST 18 11/16/2019 1028   ALT 16 11/16/2019 1028   BILITOT 0.6 11/16/2019 1028       Impression and Plan: Mr. Nagy is a very pleasant 85 yo caucasian gentleman with thrombocytopenia.  His platelet count remains stable and he is asymptomatic at this time.  We will plan to see him back in another 6 months.  He promises to contact our office with any questions or concerns. We can certainly see him sooner if needed.   Laverna Peace, NP 12/30/202011:21 AM

## 2019-11-17 LAB — IRON AND TIBC
Iron: 57 ug/dL (ref 42–163)
Saturation Ratios: 20 % (ref 20–55)
TIBC: 294 ug/dL (ref 202–409)
UIBC: 236 ug/dL (ref 117–376)

## 2019-11-17 LAB — FERRITIN: Ferritin: 47 ng/mL (ref 24–336)

## 2019-12-01 ENCOUNTER — Other Ambulatory Visit: Payer: Self-pay

## 2019-12-01 ENCOUNTER — Ambulatory Visit (INDEPENDENT_AMBULATORY_CARE_PROVIDER_SITE_OTHER): Payer: Medicare Other | Admitting: Pharmacist

## 2019-12-01 DIAGNOSIS — I4891 Unspecified atrial fibrillation: Secondary | ICD-10-CM | POA: Diagnosis not present

## 2019-12-01 DIAGNOSIS — Z5181 Encounter for therapeutic drug level monitoring: Secondary | ICD-10-CM | POA: Diagnosis not present

## 2019-12-01 LAB — POCT INR: INR: 2.1 (ref 2.0–3.0)

## 2019-12-01 NOTE — Patient Instructions (Signed)
Description   Continue taking 1/2 tablet daily except 1 tablet on Mondays and Wednesdays.  Recheck in 4 weeks. Call [phone] if scheduled for any procedures or on any new medications

## 2019-12-07 ENCOUNTER — Ambulatory Visit (INDEPENDENT_AMBULATORY_CARE_PROVIDER_SITE_OTHER): Payer: Medicare Other | Admitting: Physical Medicine and Rehabilitation

## 2019-12-07 ENCOUNTER — Ambulatory Visit: Payer: Self-pay

## 2019-12-07 ENCOUNTER — Other Ambulatory Visit: Payer: Self-pay

## 2019-12-07 ENCOUNTER — Encounter: Payer: Self-pay | Admitting: Physical Medicine and Rehabilitation

## 2019-12-07 VITALS — BP 134/65 | HR 75 | Ht 77.0 in | Wt 172.0 lb

## 2019-12-07 DIAGNOSIS — S39012A Strain of muscle, fascia and tendon of lower back, initial encounter: Secondary | ICD-10-CM

## 2019-12-07 DIAGNOSIS — M47816 Spondylosis without myelopathy or radiculopathy, lumbar region: Secondary | ICD-10-CM

## 2019-12-07 DIAGNOSIS — M48061 Spinal stenosis, lumbar region without neurogenic claudication: Secondary | ICD-10-CM | POA: Diagnosis not present

## 2019-12-07 DIAGNOSIS — R531 Weakness: Secondary | ICD-10-CM

## 2019-12-07 MED ORDER — HYDROCODONE-ACETAMINOPHEN 5-325 MG PO TABS: 1.0000 | ORAL_TABLET | Freq: Three times a day (TID) | ORAL | 0 refills | Status: AC | PRN

## 2019-12-07 MED ORDER — PREDNISONE 50 MG PO TABS: ORAL_TABLET | ORAL | 0 refills | Status: DC

## 2019-12-07 NOTE — Progress Notes (Signed)
 .  Numeric Pain Rating Scale and Functional Assessment Average Pain 8 Pain Right Now 9 My pain is constant and sharp Pain is worse with: walking, bending and standing Pain improves with: heat/ice and medication   In the last MONTH (on 0-10 scale) has pain interfered with the following?  1. General activity like being  able to carry out your everyday physical activities such as walking, climbing stairs, carrying groceries, or moving a chair?  Rating(9)  2. Relation with others like being able to carry out your usual social activities and roles such as  activities at home, at work and in your community. Rating(9)  3. Enjoyment of life such that you have  been bothered by emotional problems such as feeling anxious, depressed or irritable?  Rating(6)

## 2019-12-13 ENCOUNTER — Other Ambulatory Visit: Payer: Self-pay

## 2019-12-13 ENCOUNTER — Ambulatory Visit (INDEPENDENT_AMBULATORY_CARE_PROVIDER_SITE_OTHER): Payer: Medicare Other | Admitting: *Deleted

## 2019-12-13 DIAGNOSIS — Z5181 Encounter for therapeutic drug level monitoring: Secondary | ICD-10-CM | POA: Diagnosis not present

## 2019-12-13 DIAGNOSIS — I4891 Unspecified atrial fibrillation: Secondary | ICD-10-CM

## 2019-12-13 LAB — POCT INR: INR: 2.3 (ref 2.0–3.0)

## 2019-12-13 NOTE — Patient Instructions (Addendum)
Description   Continue taking 1/2 tablet daily except 1 tablet on Mondays and Wednesdays.  Recheck INR at appointment with Dr. Irish Lack. Call [phone] if scheduled for any procedures or on any new medications

## 2019-12-15 ENCOUNTER — Telehealth: Payer: Self-pay | Admitting: Physical Medicine and Rehabilitation

## 2019-12-15 ENCOUNTER — Other Ambulatory Visit: Payer: Self-pay | Admitting: Physical Medicine and Rehabilitation

## 2019-12-15 MED ORDER — GABAPENTIN 300 MG PO CAPS: 300.0000 mg | ORAL_CAPSULE | Freq: Two times a day (BID) | ORAL | 4 refills | Status: DC

## 2019-12-15 NOTE — Telephone Encounter (Signed)
Called patient to advise  °

## 2019-12-15 NOTE — Telephone Encounter (Signed)
Scheduled for 2/15. Patient states that he wants the gabapentin sent to [facility]. I think I successfully changed this in his chart.

## 2019-12-15 NOTE — Telephone Encounter (Signed)
Done

## 2019-12-15 NOTE — Telephone Encounter (Signed)
Bilat L4 tf esi if needed - Nerve block

## 2019-12-26 ENCOUNTER — Telehealth: Payer: Self-pay | Admitting: Physical Medicine and Rehabilitation

## 2019-12-26 ENCOUNTER — Other Ambulatory Visit: Payer: Self-pay | Admitting: Physical Medicine and Rehabilitation

## 2019-12-26 MED ORDER — HYDROCODONE-ACETAMINOPHEN 5-325 MG PO TABS: 1.0000 | ORAL_TABLET | Freq: Three times a day (TID) | ORAL | 0 refills | Status: AC | PRN

## 2019-12-29 ENCOUNTER — Other Ambulatory Visit: Payer: Self-pay

## 2019-12-29 ENCOUNTER — Ambulatory Visit (INDEPENDENT_AMBULATORY_CARE_PROVIDER_SITE_OTHER): Payer: Medicare Other

## 2019-12-29 ENCOUNTER — Ambulatory Visit: Payer: Medicare Other | Admitting: Interventional Cardiology

## 2019-12-29 DIAGNOSIS — Z5181 Encounter for therapeutic drug level monitoring: Secondary | ICD-10-CM | POA: Diagnosis not present

## 2019-12-29 DIAGNOSIS — I4891 Unspecified atrial fibrillation: Secondary | ICD-10-CM | POA: Diagnosis not present

## 2019-12-29 LAB — POCT INR: INR: 2.6 (ref 2.0–3.0)

## 2019-12-29 NOTE — Patient Instructions (Signed)
Description   Continue on same dosage 1/2 tablet daily except 1 tablet on Mondays and Wednesdays.  Recheck INR in 4 weeks. Call [phone] if scheduled for any procedures or on any new medications

## 2020-01-02 ENCOUNTER — Encounter: Payer: Medicare Other | Admitting: Physical Medicine and Rehabilitation

## 2020-01-02 ENCOUNTER — Encounter: Payer: Self-pay | Admitting: *Deleted

## 2020-01-04 ENCOUNTER — Other Ambulatory Visit: Payer: Self-pay

## 2020-01-04 ENCOUNTER — Ambulatory Visit (HOSPITAL_COMMUNITY)
Admission: RE | Admit: 2020-01-04 | Discharge: 2020-01-04 | Disposition: A | Discharge status: Home / Self Care | Payer: Medicare Other | Source: Ambulatory Visit | Attending: Internal Medicine | Admitting: Internal Medicine

## 2020-01-04 ENCOUNTER — Encounter (HOSPITAL_COMMUNITY): Payer: Self-pay | Admitting: Internal Medicine

## 2020-01-04 VITALS — BP 134/86 | HR 76 | Wt 175.6 lb

## 2020-01-04 DIAGNOSIS — I5032 Chronic diastolic (congestive) heart failure: Secondary | ICD-10-CM | POA: Insufficient documentation

## 2020-01-04 DIAGNOSIS — Z832 Family history of diseases of the blood and blood-forming organs and certain disorders involving the immune mechanism: Secondary | ICD-10-CM | POA: Diagnosis not present

## 2020-01-04 DIAGNOSIS — I071 Rheumatic tricuspid insufficiency: Secondary | ICD-10-CM

## 2020-01-04 DIAGNOSIS — Z8719 Personal history of other diseases of the digestive system: Secondary | ICD-10-CM | POA: Diagnosis not present

## 2020-01-04 DIAGNOSIS — E7800 Pure hypercholesterolemia, unspecified: Secondary | ICD-10-CM | POA: Diagnosis not present

## 2020-01-04 DIAGNOSIS — Z953 Presence of xenogenic heart valve: Secondary | ICD-10-CM | POA: Insufficient documentation

## 2020-01-04 DIAGNOSIS — Z79899 Other long term (current) drug therapy: Secondary | ICD-10-CM | POA: Diagnosis not present

## 2020-01-04 DIAGNOSIS — I480 Paroxysmal atrial fibrillation: Secondary | ICD-10-CM | POA: Diagnosis not present

## 2020-01-04 DIAGNOSIS — Z8249 Family history of ischemic heart disease and other diseases of the circulatory system: Secondary | ICD-10-CM | POA: Insufficient documentation

## 2020-01-04 DIAGNOSIS — Z85820 Personal history of malignant melanoma of skin: Secondary | ICD-10-CM | POA: Diagnosis not present

## 2020-01-04 DIAGNOSIS — Z7901 Long term (current) use of anticoagulants: Secondary | ICD-10-CM | POA: Insufficient documentation

## 2020-01-04 DIAGNOSIS — G609 Hereditary and idiopathic neuropathy, unspecified: Secondary | ICD-10-CM | POA: Diagnosis not present

## 2020-01-04 DIAGNOSIS — I081 Rheumatic disorders of both mitral and tricuspid valves: Secondary | ICD-10-CM | POA: Diagnosis not present

## 2020-01-04 DIAGNOSIS — I110 Hypertensive heart disease with heart failure: Secondary | ICD-10-CM | POA: Insufficient documentation

## 2020-01-04 DIAGNOSIS — I4510 Unspecified right bundle-branch block: Secondary | ICD-10-CM | POA: Diagnosis not present

## 2020-01-04 DIAGNOSIS — I440 Atrioventricular block, first degree: Secondary | ICD-10-CM | POA: Insufficient documentation

## 2020-01-04 DIAGNOSIS — Z9889 Other specified postprocedural states: Secondary | ICD-10-CM | POA: Diagnosis not present

## 2020-01-04 DIAGNOSIS — C61 Malignant neoplasm of prostate: Secondary | ICD-10-CM | POA: Diagnosis not present

## 2020-01-04 DIAGNOSIS — Z9079 Acquired absence of other genital organ(s): Secondary | ICD-10-CM | POA: Insufficient documentation

## 2020-01-04 DIAGNOSIS — Z8261 Family history of arthritis: Secondary | ICD-10-CM | POA: Diagnosis not present

## 2020-01-04 DIAGNOSIS — Z823 Family history of stroke: Secondary | ICD-10-CM | POA: Insufficient documentation

## 2020-01-04 DIAGNOSIS — I2721 Secondary pulmonary arterial hypertension: Secondary | ICD-10-CM | POA: Diagnosis not present

## 2020-01-04 LAB — COMPREHENSIVE METABOLIC PANEL
ALT: 19 U/L (ref 0–44)
AST: 24 U/L (ref 15–41)
Albumin: 3.9 g/dL (ref 3.5–5.0)
Alkaline Phosphatase: 54 U/L (ref 38–126)
Anion gap: 10 (ref 5–15)
BUN: 18 mg/dL (ref 8–23)
CO2: 26 mmol/L (ref 22–32)
Calcium: 9.7 mg/dL (ref 8.9–10.3)
Chloride: 104 mmol/L (ref 98–111)
Creatinine, Ser: 1.04 mg/dL (ref 0.61–1.24)
GFR calc Af Amer: >60 mL/min (ref >60)
GFR calc non Af Amer: >60 mL/min (ref >60)
Glucose, Bld: 97 mg/dL (ref 70–99)
Potassium: 4.6 mmol/L (ref 3.5–5.1)
Sodium: 140 mmol/L (ref 135–145)
Total Bilirubin: 0.9 mg/dL (ref 0.3–1.2)
Total Protein: 6.9 g/dL (ref 6.5–8.1)

## 2020-01-04 LAB — CBC
HCT: 45.3 % (ref 39.0–52.0)
Hemoglobin: 14.7 g/dL (ref 13.0–17.0)
MCH: 31.4 pg (ref 26.0–34.0)
MCHC: 32.5 g/dL (ref 30.0–36.0)
MCV: 96.8 fL (ref 80.0–100.0)
Platelets: 64 10*3/uL — ABNORMAL LOW (ref 150–400)
RBC: 4.68 MIL/uL (ref 4.22–5.81)
RDW: 12.9 % (ref 11.5–15.5)
WBC: 6.2 10*3/uL (ref 4.0–10.5)
nRBC: 0 % (ref 0.0–0.2)

## 2020-01-04 LAB — TSH: TSH: 1.03 u[IU]/mL (ref 0.350–4.500)

## 2020-01-04 LAB — T4, FREE: Free T4: 1.15 ng/dL — ABNORMAL HIGH (ref 0.61–1.12)

## 2020-01-04 NOTE — Progress Notes (Signed)
[facility] Note   Date:  01/04/2020   ID:  [name] [name], DOB [birth year], MRN [id number]  Location: Home  Provider location: [facility] Type of Visit: Established patient  PCP:  [name] Lukes, MD  Cardiologist:  [name] Grooms, MD Primary HF: [name] [name]  Chief Complaint: Heart Failure follow-up   History of Present Illness:  [name] [name] ([place]) is an 86 y.o. male with history of diastolic HF, PAF/Aflutter on coumadin, and h/o severe MV prolapse s/p MV repair 10/01/11.  He was admitted 04/26/17-04/30/17 with a fall, no syncope. Also with right sided heart failure. RHC showed normal cardiac output. No evidence of PAH. Diuresed with IV lasix, weight down about 10 pounds. Discharge weight was 190 pounds. He was discharged to SNF.   Admitted in 5/20 for RLL PNA. Also had some HF and diuresed 11 pounds. D/c weight was 162.   Previously we cut amio back due to 1AVB and also changed torsemide from 20 bid to 40 daily.   Here for routine f/u. Doing pretty well. Gets around with walker. No SOB, orthopnea or PND. Conscious of his heart beat when lying back in easy chair. Had prostate out 28 years ago and now PSA doubling in 4 months. Also having injections for back pain. No edema.  Echo 5/20: EF 55-60% RV normal. RVSP 35 moderate to severe TR.   ECHO 09/04/2017  LVEF 55-60%  Severe TR, RV moderately to severely dilated. Flattened septum Echo 12/19/16 with LVEF 50-55%, Mild AI, s/p MVR with mild residual MR, Mod LAE, Mod RV dilation, severe RAE, Severe TR.  RHC 6/18 RA = 8 RV = 36/7 PA = 33/4 (15) PCW = 6 Fick cardiac output/index = 7.7/3.6 PVR = 1.2 WU Ao sat = 99% PA sat = 69%, 72% SVC sat = 69%      [name] denies symptoms worrisome for COVID 19.   Past Medical History:  Diagnosis Date  . Abnormality of gait 05/27/2016  . Adenomatous polyps   . Carpal tunnel syndrome 06/25/2016   Right  . Depressive  disorder, not elsewhere classified   . First degree AV block    Holter 3/18: NSR, PACs, PVCs, no AFib, no pauses.  Marland Kitchen Hereditary and idiopathic peripheral neuropathy 06/25/2016  . Hypertension   . Internal nasal lesion 05/15/2013  . Melanoma ([place])    Left Shoulder  . Mitral regurgitation   . MVP (mitral valve prolapse)    a. With severe MR s/p Complex valvuloplasty including artificial Gore-tex neochord placement x4, chordal transposition x1, chordal release x1, # 32 mm Sorin Memo 3D Ring Annuloplasty 2012. // b. Echo 2/18: mild LVH, EF 50-55, mild AI, MV repair with mild MR, mod LAE, mod RVE, severe RAE, severe TR  . Neuropathy   . Normal coronary arteries    a. Normal coronary anatomy by cath 2012.  . Osteoarthritis    Knees  . PAF (paroxysmal atrial fibrillation) ([place])    a. Post-op MVR 2012.  Marland Kitchen Personal history of colonic polyps   . Prostate cancer ([place])   . Pulmonary HTN ([place])    a. Mild-mod by cath 2012.  . Pure hypercholesterolemia   . PVC (premature ventricular contraction)   . Thrombocytopenia ([place])   . Vision abnormalities    Cornea scarring   Past Surgical History:  Procedure Laterality Date  . CARDIAC CATHETERIZATION  09/2011   Pre-op for MVR -- normal coronaries.  Marland Kitchen  CARDIOVERSION N/A 01/02/2016   Procedure: CARDIOVERSION;  Surgeon: [name] Headings, MD;  Location: [facility] ENDOSCOPY;  Service: Cardiovascular;  Laterality: N/A;  . COLONOSCOPY W/ POLYPECTOMY    . INGUINAL HERNIA REPAIR  09/2009   Left  . KNEE ARTHROSCOPY      left x3  and right x2  . Melanoma Surgery     2001, 2005, 2006, 2009  . MITRAL VALVE REPAIR  10/01/2011   complex valvuloplasty with Goretex cord replacement and chordal transposition 68mm Sorin Memo 3D ring annuloplasty  . Nuclear Stress Test  09/2006   EF-64%, Normal  . PROSTATECTOMY  1993  . RIGHT HEART CATH N/A 04/29/2017   Procedure: Right Heart Cath;  Surgeon: [name] Artist, MD;  Location: [facility] LAB;  Service: Cardiovascular;   Laterality: N/A;  . ROOT CANAL  08-19-12  . ROTATOR CUFF REPAIR  2003   left  . TEE WITHOUT CARDIOVERSION  09/26/2011   Procedure: TRANSESOPHAGEAL ECHOCARDIOGRAM (TEE);  Surgeon: [name] Perla, MD;  Location: [facility] ENDOSCOPY;  Service: Cardiovascular;  Laterality: N/A;  . US ECHOCARDIOGRAPHY  09/2009, 08/1011   mild LVH,mild AI,MVP with mild MR, mild-mod. TR with mild Pulm. HTN, EF-55-60%     Current Outpatient Medications  Medication Sig Dispense Refill  . acetaminophen (TYLENOL) 500 MG tablet Take 1,000 mg by mouth every 6 (six) hours as needed for mild pain.    Marland Kitchen amiodarone (PACERONE) 200 MG tablet Take 100 mg by mouth daily.    Marland Kitchen b complex vitamins tablet Take 1 tablet by mouth daily.    . cycloSPORINE (RESTASIS) 0.05 % ophthalmic emulsion Place 1 drop into both eyes 2 (two) times daily.    . ferrous fumarate (HEMOCYTE - 106 MG FE) 325 (106 Fe) MG TABS tablet Take 1 tablet (106 mg of iron total) by mouth daily. 30 each 3  . fluticasone (FLONASE) 50 MCG/ACT nasal spray Place 2 sprays into both nostrils daily.   11  . gabapentin (NEURONTIN) 300 MG capsule Take 300 mg by mouth 3 (three) times daily.    Marland Kitchen KLOR-CON M20 20 MEQ tablet TAKE 2 TABLETS DAILY 180 tablet 3  . Multiple Vitamin (MULTIVITAMIN WITH MINERALS) TABS tablet Take 1 tablet by mouth every evening.     . rosuvastatin (CRESTOR) 5 MG tablet TAKE 1 TABLET DAILY 90 tablet 3  . torsemide (DEMADEX) 20 MG tablet TAKE 2 TABLETS DAILY 180 tablet 3  . traZODone (DESYREL) 100 MG tablet Take 200 mg by mouth at bedtime.    Marland Kitchen warfarin (COUMADIN) 5 MG tablet TAKE 1 TABLET DAILY AS DIRECTED BY THE COUMADIN CLINIC 90 tablet 3   No current facility-administered medications for this encounter.    Allergies:   Patient has no known allergies.   Social History:  The patient  reports that he has never smoked. He has never used smokeless tobacco. He reports that he does not drink alcohol or use drugs.   Family History:  The patient's family  history includes Arthritis in his mother; Clotting disorder in his brother; Hypertension in his father and mother; Psychosis in his father; Stroke in his mother.   ROS:  Please see the history of present illness.   All other systems are personally reviewed and negative.   Vitals:   01/04/20 1103  BP: 134/86  Pulse: 76  SpO2: 96%  Weight: 79.7 kg (175 lb 9.6 oz)    Exam:   General:  Elderly. Well appearing. No resp difficulty  HEENT: normal Neck:  supple. no JVD. Carotids 2+ bilat; no bruits. No lymphadenopathy or thryomegaly appreciated. Cor: PMI nondisplaced. Regular rate & rhythm. No rubs, gallops or murmurs. Lungs: clear Abdomen: soft, nontender, nondistended. No hepatosplenomegaly. No bruits or masses. Good bowel sounds. Extremities: no cyanosis, clubbing, rash, edema chronic venous changes Neuro: alert & orientedx3, cranial nerves grossly intact. moves all 4 extremities w/o difficulty. Affect pleasant   Recent Labs: 04/03/2019: Magnesium 2.3 04/05/2019: B Natriuretic Peptide 607.8 04/26/2019: Pro B Natriuretic peptide (BNP) 444.0 08/08/2019: TSH 1.387 11/16/2019: ALT 16; BUN 30; Creatinine 1.15; Hemoglobin 14.2; Platelet Count 73; Potassium 4.9; Sodium 141  Personally reviewed   Wt Readings from Last 3 Encounters:  01/04/20 79.7 kg (175 lb 9.6 oz)  12/07/19 78 kg (172 lb)  11/16/19 78 kg (172 lb)    ECG: Sb 59 1AVB (358ms) iRBBB. Personally reviewed   ASSESSMENT AND PLAN:  1. Combined diastolic HF with R>L symptoms in the setting of longstanding MV disease.  - Stable NYHA II. Volume status looks good.  - has cut torsemide down to 20 daily Volume status remains stable. Can take extra as needed - Check labs today - RTC in 2 months to evaluate.    2. PAF - Remains in NSR on amio 100 mg daily - Check labs today. - Continue warfarin for anticoagulation. No bleeding. Will not switch to DOAC with h/o MV repair  3. Severe TR - No change on recent echo - Not candidate  for TVR due to age and comorbidities.  - Volume status well controlled with medical therapy. If decompensates could consider evaluation for TV clip - Repeat echo in 6 months  4. [place] - RHC 6/18 without severe PAH.  (RA 8, PA 33/4 (15), CI 3.6)  5. Severe MVP S/P Bioprosthetic MVR  - Stable. - We discussed continue need for SBE prophylaxis before any procedure   6. Marked 1AVB - Toprol and amio cut back.  - Follow closely for possible PPM need.  - Off Toprol - Will get ECG today -> NSR with PR interval 280 ms  7. Recurrent prostate CA - PSA up 1.2 -> 2.4. Bone scan negative. - Urology contemplating Lupron vs XRT. Either is ok from our standpoint.    Signed, [name], MD  01/04/2020 11:23 AM  [facility] [place] [address] [phone] (office) [phone] (fax)

## 2020-01-04 NOTE — Patient Instructions (Signed)
Lab work done today. We will notify you of any abnormal lab work. No news is good news!   EKG done today.   Your physician has requested that you have an echocardiogram. Echocardiography is a painless test that uses sound waves to create images of your heart. It provides your doctor with information about the size and shape of your heart and how well your heart's chambers and valves are working. This procedure takes approximately one hour. There are no restrictions for this procedure. This will be done at your follow up appointment in 6 months.  Please follow up with the [facility] in 6 months with an echocardiogram. We currently do not have that schedule. Please give [place] a call in June 2021 in order to schedule that appointment. Please call us at [phone] option #3.  At the [facility], you and your health needs are our priority. As part of our continuing mission to provide you with exceptional heart care, we have created designated Provider Care Teams. These Care Teams include your primary Cardiologist (physician) and Advanced Practice Providers (APPs- Physician Assistants and Nurse Practitioners) who all work together to provide you with the care you need, when you need it.   You may see any of the following providers on your designated Care Team at your next follow up: Marland Kitchen Dr [name] . Dr Loralie Champagne . Darrick Grinder, NP . Lyda Jester, PA . Audry Riles, PharmD   Please be sure to bring in all your medications bottles to every appointment.

## 2020-01-05 ENCOUNTER — Encounter: Payer: Medicare Other | Admitting: Physical Medicine and Rehabilitation

## 2020-01-05 LAB — T3, FREE: T3, Free: 2.7 pg/mL (ref 2.0–4.4)

## 2020-01-05 NOTE — Progress Notes (Signed)
Patient has follow up appointment this morning denies any pain or discomfort this morning. Has some urgency, with a  Weak urine stream, reports nocturia 3-4 times nightly. Feels he does not have to frequently go more than every 2 hours and as far as he is aware of is emptying his bladder.

## 2020-01-06 ENCOUNTER — Telehealth: Payer: Self-pay | Admitting: *Deleted

## 2020-01-06 ENCOUNTER — Ambulatory Visit
Admission: RE | Admit: 2020-01-06 | Discharge: 2020-01-06 | Disposition: A | Discharge status: Home / Self Care | Payer: Medicare Other | Source: Ambulatory Visit | Attending: Urology | Admitting: Urology

## 2020-01-06 ENCOUNTER — Encounter: Payer: Self-pay | Admitting: Urology

## 2020-01-06 ENCOUNTER — Ambulatory Visit
Admission: RE | Admit: 2020-01-06 | Discharge: 2020-01-06 | Disposition: A | Discharge status: Home / Self Care | Payer: Medicare Other | Source: Ambulatory Visit | Attending: Radiation Oncology | Admitting: Radiation Oncology

## 2020-01-06 ENCOUNTER — Other Ambulatory Visit: Payer: Self-pay

## 2020-01-06 DIAGNOSIS — C61 Malignant neoplasm of prostate: Secondary | ICD-10-CM

## 2020-01-06 NOTE — Progress Notes (Signed)
Radiation Oncology         [phone] ________________________________  Follow Up Visit - Conducted via telephone due to current COVID-19 concerns for limiting patient exposure  Name: [name] [name] MRN: [id number]  Date: 01/06/2020  DOB: 1931-01-23  [id number], Bonnita Levan, MD  Davis Gourd*   REFERRING PHYSICIAN: Davis Gourd*  DIAGNOSIS: 86 y.o. gentleman with biochemical recurrence of Gleason 3+4 adenocarcinoma of the prostate with current PSA of 2.42 in 12/2019 s/p RRP 10/1992.    ICD-10-CM   1. ADENOCARCINOMA, PROSTATE  C61     HISTORY OF PRESENT ILLNESS: [name] [name] is a 86 y.o. male with a diagnosis of prostate cancer. He was initially diagnosed in 09/1992 with Gleason 3+4 disease and a PSA of 6.5 at the time of his biopsy. He opted to undergo a radical retropubic prostatectomy with BPLND on 10/31/1992 under the care and direction of Dr. Luanne Bras. His pathology was favorable with negative margins, no ECE, no SVI and no lymph nodes involved.  His postoperative PSA was undetectable and remained undetectable until around 2015 when it was noted at 0.23 during a routine follow up visit with Dr. Rana Snare. His PSA has been followed closely since that time, showing a very slow rise over time at 0.29 in January 2016, 0.34 in July 2017, 0.57 in July 2018 and 0.63 on 06/23/2018. However, unfortunately, his PSA at the time of follow up visit with Dr. Lovena Neighbours on 01/31/2019 had increased to 1.44, more than double in 6 months.  He proceeded with a CT C/A/P on 02/09/2019 for disease restaging, with results showing no obvious concerning findings for metastatic disease.  There was a mildly enlarged right obturator node which appeared stable as compared with prior CT scan from 2015 and favored to be benign.  A Bone scan on 02/28/2019 was negative for evidence of osseous metastatic disease.  He had an Axumin PET 03/29/19 for further evaluation and this showed evidence of local  prostate cancer recurrence in the LEFT aspect of the prostatectomy bed positioned between the base the bladder and rectum but no convincing evidence of metastatic pelvic lymphadenopathy.   We last saw the patient on 03/30/2019 and at that time, he was undecided and ultimately opted to defer salvage radiation therapy and continue with active PSA surveillance with his urologist, Dr. Lovena Neighbours. His PSA was decreased at 1.1 in 04/2019 and relatively stable at 1.23 in 08/2019, however, most recently on 12/27/19, it was noted to have increased to 2.42, again, doubling in a short period of time. Therefore, he has been kindly referred back to [place] to further discuss salvage treatment options.  PREVIOUS RADIATION THERAPY: No  PAST MEDICAL HISTORY:  Past Medical History:  Diagnosis Date  . Abnormality of gait 05/27/2016  . Adenomatous polyps   . Carpal tunnel syndrome 06/25/2016   Right  . Depressive disorder, not elsewhere classified   . First degree AV block    Holter 3/18: NSR, PACs, PVCs, no AFib, no pauses.  Marland Kitchen Hereditary and idiopathic peripheral neuropathy 06/25/2016  . Hypertension   . Internal nasal lesion 05/15/2013  . Melanoma ([place])    Left Shoulder  . Mitral regurgitation   . MVP (mitral valve prolapse)    a. With severe MR s/p Complex valvuloplasty including artificial Gore-tex neochord placement x4, chordal transposition x1, chordal release x1, # 32 mm Sorin Memo 3D Ring Annuloplasty 2012. // b. Echo 2/18: mild LVH, EF 50-55, mild AI, MV repair with mild MR, mod  LAE, mod RVE, severe RAE, severe TR  . Neuropathy   . Normal coronary arteries    a. Normal coronary anatomy by cath 2012.  . Osteoarthritis    Knees  . PAF (paroxysmal atrial fibrillation) ([place])    a. Post-op MVR 2012.  Marland Kitchen Personal history of colonic polyps   . Prostate cancer ([place])   . Pulmonary HTN ([place])    a. Mild-mod by cath 2012.  . Pure hypercholesterolemia   . PVC (premature ventricular contraction)   . Thrombocytopenia ([place])    . Vision abnormalities    Cornea scarring      PAST SURGICAL HISTORY: Past Surgical History:  Procedure Laterality Date  . CARDIAC CATHETERIZATION  09/2011   Pre-op for MVR -- normal coronaries.  Marland Kitchen CARDIOVERSION N/A 01/02/2016   Procedure: CARDIOVERSION;  Surgeon: Thayer Headings, MD;  Location: [facility] ENDOSCOPY;  Service: Cardiovascular;  Laterality: N/A;  . COLONOSCOPY W/ POLYPECTOMY    . INGUINAL HERNIA REPAIR  09/2009   Left  . KNEE ARTHROSCOPY      left x3  and right x2  . Melanoma Surgery     2001, 2005, 2006, 2009  . MITRAL VALVE REPAIR  10/01/2011   complex valvuloplasty with Goretex cord replacement and chordal transposition 69mm Sorin Memo 3D ring annuloplasty  . Nuclear Stress Test  09/2006   EF-64%, Normal  . PROSTATECTOMY  1993  . RIGHT HEART CATH N/A 04/29/2017   Procedure: Right Heart Cath;  Surgeon: Jolaine Artist, MD;  Location: [place] CV LAB;  Service: Cardiovascular;  Laterality: N/A;  . ROOT CANAL  08-19-12  . ROTATOR CUFF REPAIR  2003   left  . TEE WITHOUT CARDIOVERSION  09/26/2011   Procedure: TRANSESOPHAGEAL ECHOCARDIOGRAM (TEE);  Surgeon: Lelon Perla, MD;  Location: [facility] ENDOSCOPY;  Service: Cardiovascular;  Laterality: N/A;  . US ECHOCARDIOGRAPHY  09/2009, 08/1011   mild LVH,mild AI,MVP with mild MR, mild-mod. TR with mild Pulm. HTN, EF-55-60%    FAMILY HISTORY:  Family History  Problem Relation Age of Onset  . Clotting disorder Brother        CVA's  . Arthritis Mother   . Hypertension Mother   . Stroke Mother   . Hypertension Father   . Psychosis Father        psychiatric care  . Colon cancer Neg Hx   . Stomach cancer Neg Hx   . Heart attack Neg Hx   . Prostate cancer Neg Hx   . Pancreatic cancer Neg Hx     SOCIAL HISTORY:  Social History   Socioeconomic History  . Marital status: Widowed    Spouse name: Not on file  . Number of children: 2  . Years of education: 54  . Highest education level: Not on file  Occupational  History    Comment: retired  Tobacco Use  . Smoking status: Never Smoker  . Smokeless tobacco: Never Used  Substance and Sexual Activity  . Alcohol use: No    Alcohol/week: 0.0 standard drinks    Comment: Last drink in 2000  . Drug use: No  . Sexual activity: Not Currently  Other Topics Concern  . Not on file  Social History Narrative   Retired - Optometrist   Widower   2 children (one in [place] and on one in [place])    Drinks 1 cup of coffee per day   Social Determinants of Radio broadcast assistant Strain:   . Difficulty of Paying Living Expenses:  Not on file  Food Insecurity:   . Worried About Charity fundraiser in the Last Year: Not on file  . Ran Out of Food in the Last Year: Not on file  Transportation Needs:   . Lack of Transportation (Medical): Not on file  . Lack of Transportation (Non-Medical): Not on file  Physical Activity:   . Days of Exercise per Week: Not on file  . Minutes of Exercise per Session: Not on file  Stress:   . Feeling of Stress : Not on file  Social Connections:   . Frequency of Communication with Friends and Family: Not on file  . Frequency of Social Gatherings with Friends and Family: Not on file  . Attends Religious Services: Not on file  . Active Member of Clubs or Organizations: Not on file  . Attends Archivist Meetings: Not on file  . Marital Status: Not on file  Intimate Partner Violence:   . Fear of Current or Ex-Partner: Not on file  . Emotionally Abused: Not on file  . Physically Abused: Not on file  . Sexually Abused: Not on file    ALLERGIES: Patient has no known allergies.  MEDICATIONS:  Current Outpatient Medications  Medication Sig Dispense Refill  . acetaminophen (TYLENOL) 500 MG tablet Take 1,000 mg by mouth every 6 (six) hours as needed for mild pain.    Marland Kitchen amiodarone (PACERONE) 200 MG tablet Take 100 mg by mouth daily.    Marland Kitchen b complex vitamins tablet Take 1 tablet by mouth daily.    . cycloSPORINE  (RESTASIS) 0.05 % ophthalmic emulsion Place 1 drop into both eyes 2 (two) times daily.    . ferrous fumarate (HEMOCYTE - 106 MG FE) 325 (106 Fe) MG TABS tablet Take 1 tablet (106 mg of iron total) by mouth daily. 30 each 3  . fluticasone (FLONASE) 50 MCG/ACT nasal spray Place 2 sprays into both nostrils daily.   11  . gabapentin (NEURONTIN) 300 MG capsule Take 300 mg by mouth 3 (three) times daily.    Marland Kitchen KLOR-CON M20 20 MEQ tablet TAKE 2 TABLETS DAILY 180 tablet 3  . Multiple Vitamin (MULTIVITAMIN WITH MINERALS) TABS tablet Take 1 tablet by mouth every evening.     . rosuvastatin (CRESTOR) 5 MG tablet TAKE 1 TABLET DAILY 90 tablet 3  . torsemide (DEMADEX) 20 MG tablet TAKE 2 TABLETS DAILY 180 tablet 3  . traZODone (DESYREL) 100 MG tablet Take 200 mg by mouth at bedtime.    Marland Kitchen warfarin (COUMADIN) 5 MG tablet TAKE 1 TABLET DAILY AS DIRECTED BY THE COUMADIN CLINIC 90 tablet 3   No current facility-administered medications for this encounter.    REVIEW OF SYSTEMS:  On review of systems, the patient reports that he is doing well overall. He denies any chest pain, shortness of breath, cough, fevers, chills, night sweats, unintended weight changes. He denies any bowel disturbances, and denies abdominal pain, nausea or vomiting. He reports constant bilateral knee pain. His IPSS was 12, indicating moderate urinary symptoms. He reports mild daytime urinary urgency and frequency, mild urinary leakage that doesn't require a pad, and nocturia x3. He notes the nocturia is likely secondary to his diuretic. His SHIM was 1, indicating he has severe erectile dysfunction. A complete review of systems is obtained and is otherwise negative.    PHYSICAL EXAM:  Wt Readings from Last 3 Encounters:  01/04/20 175 lb 9.6 oz (79.7 kg)  12/07/19 172 lb (78 kg)  11/16/19 172 lb (  78 kg)   Temp Readings from Last 3 Encounters:  11/16/19 97.7 F (36.5 C) (Temporal)  09/29/19 98 F (36.7 C) (Temporal)  06/23/19 (!) 97.5 F  (36.4 C) (Oral)   BP Readings from Last 3 Encounters:  01/04/20 134/86  12/07/19 134/65  11/16/19 (!) 98/55   Pulse Readings from Last 3 Encounters:  01/04/20 76  12/07/19 75  11/16/19 71   Unable to assess due to telephone consult visit format.  KPS = 70  100 - Normal; no complaints; no evidence of disease. 90   - Able to carry on normal activity; minor signs or symptoms of disease. 80   - Normal activity with effort; some signs or symptoms of disease. 35   - Cares for self; unable to carry on normal activity or to do active work. 60   - Requires occasional assistance, but is able to care for most of his personal needs. 50   - Requires considerable assistance and frequent medical care. 60   - Disabled; requires special care and assistance. 10   - Severely disabled; hospital admission is indicated although death not imminent. 11   - Very sick; hospital admission necessary; active supportive treatment necessary. 10   - Moribund; fatal processes progressing rapidly. 0     - Dead  Karnofsky DA, Abelmann [place], Craver LS and [facility] [phone]) The use of the nitrogen mustards in the palliative treatment of carcinoma: with particular reference to bronchogenic carcinoma Cancer 1 634-56  LABORATORY DATA:  Lab Results  Component Value Date   WBC 6.2 01/04/2020   HGB 14.7 01/04/2020   HCT 45.3 01/04/2020   MCV 96.8 01/04/2020   PLT 64 (L) 01/04/2020   Lab Results  Component Value Date   NA 140 01/04/2020   K 4.6 01/04/2020   CL 104 01/04/2020   CO2 26 01/04/2020   Lab Results  Component Value Date   ALT 19 01/04/2020   AST 24 01/04/2020   ALKPHOS 54 01/04/2020   BILITOT 0.9 01/04/2020     RADIOGRAPHY: No results found.    IMPRESSION/PLAN: This visit was conducted via telephone to spare the patient unnecessary potential exposure in the healthcare setting during the current COVID-19 pandemic.  1. 86 y.o. gentleman with biochemical recurrence of Gleason 3+4 adenocarcinoma  of the prostate with current PSA of 2.42 in 12/2019, s/p RRP 10/1992. Today we reviewed the findings and workup thus far.  We discussed the recent increase in his PSA and reviewed prior imaging findings on Axumin PET in 03/2019 idicating a local recurrence of prostate cancer in the surgical bed. We again reviewed salvage treatment options of ADT alone, emphasizing that this is not curative treatment but could provide durable control of the prostate cancer for a period of years, or, if prefers a more curative approach, we would recommend a 4 week course of daily external beam radiation treatment directed to the prostatic fossa and pelvic lymph nodes.  We reviewed the logistics and delivery of external beam radiation treatment. We reviewed data from the [facility] trial regarding use of ADT in combination with salvage XRT to the prostate fossa and pelvic lymph nodes in the setting of biochemical recurrence.  This study shows that extending radiation therapy to the pelvic lymph nodes combined with adding short-term hormone therapy to standard treatment of the prostate surgical bed can extend the amount of time before disease progression but does not appear to demonstrate an overall survival benefit.  Therefore, it is felt that the  toxicities, especially cardiac toxicity in this case, associated with ADT and the negative impact it has on quality of life for patient's with lower risk, Gleason 6-7 disease several years out from surgery, may outweigh the overall benefits of its use in this setting.   Regarding the 4 week course of daily salvage pelvic radiation, he again, voiced concern for arranging transportation for daily treatments since he does not drive himself. He does have a caregiver that comes daily, Mon.-Fri. He did focus quite a bit of his questions on ADT alone, and should he elect to proceed in this direction, he would be an excellent patient to consider starting on Orgovyx, oral ADT, since it does have a lower  risk of cardiac side effects and appears to be safer for use in patients with cardiovascular disease as compared to the injectable therapies.  At the end of the conversation, the patient again remains undecided and would like some additional time to consider his options/treatment recommendations for salvage radiotherapy to the prostate fossa and pelvic lymph nodes versus ADT alone versus continued active surveillance. We discussed hypofractionation of treatment with a 4 week course of daily radiaton in his case, to reduce his length of treatment/exposure during the current COVID-19 pandemic given his advanced age and multiple medical comorbidities. He is planning to discuss this further with his children as well as reach out to Dr. Lovena Neighbours for further discussion of his options prior to making a final decision.  We will share this discussion with Dr. Lovena Neighbours and await a follow up call from the patient once he has reached a decision.  Should he ultimately elect to proceed with radiation, we will move forward with treatment planning at that time.  Given current concerns for patient exposure during the COVID-19 pandemic, this encounter was conducted via telephone. The patient was notified in advance and was offered a [place] meeting to allow for face to face communication but unfortunately reported that he did not have the appropriate resources/technology to support such a visit and instead preferred to proceed with telephone follow up visit. The patient has given verbal consent for this type of encounter. The time spent during this encounter was 30 minutes, with 50% of that time spent in reviewing outside information and coordinating his care. The attendants for this meeting include Tyler Pita MD, Tahlia Deamer PA-C, scribe Wilburn Mylar, and patient, Tauris Fahrenkrug. During the encounter, Tyler Pita MD and Freeman Caldron PA-C was located at [facility] Radiation Oncology Department.    Patient, Hartley Hisle was located at home.   Nicholos Johns, PA-C    Tyler Pita, MD  [facility] Direct Dial: [phone]  Fax: [phone] [email or web address]  Skype  [email or web address]   This document serves as a record of services personally performed by Tyler Pita, MD and Freeman Caldron, PA-C. It was created on their behalf by Wilburn Mylar, a trained medical scribe. The creation of this record is based on the scribe's personal observations and the provider's statements to them. This document has been checked and approved by the attending provider.

## 2020-01-06 NOTE — Telephone Encounter (Signed)
Called patient to inform of fu with Dr. Lovena Neighbours on 01-23-20, spoke with patient and he is aware of this appt.

## 2020-01-06 NOTE — Telephone Encounter (Signed)
Called patient to inform of fu appt. with Dr. Lovena Neighbours on 02-23-20 - arrival time- 1:30 pm, lvm for a return call

## 2020-01-10 ENCOUNTER — Telehealth: Payer: Self-pay | Admitting: Radiation Oncology

## 2020-01-10 ENCOUNTER — Ambulatory Visit: Payer: Self-pay

## 2020-01-10 ENCOUNTER — Encounter: Payer: Self-pay | Admitting: Physical Medicine and Rehabilitation

## 2020-01-10 ENCOUNTER — Other Ambulatory Visit: Payer: Self-pay

## 2020-01-10 ENCOUNTER — Ambulatory Visit (INDEPENDENT_AMBULATORY_CARE_PROVIDER_SITE_OTHER): Payer: Medicare Other | Admitting: Physical Medicine and Rehabilitation

## 2020-01-10 VITALS — BP 147/74 | HR 78

## 2020-01-10 DIAGNOSIS — M48062 Spinal stenosis, lumbar region with neurogenic claudication: Secondary | ICD-10-CM

## 2020-01-10 MED ORDER — METHYLPREDNISOLONE ACETATE 80 MG/ML IJ SUSP
40.0000 mg | Freq: Once | INTRAMUSCULAR | Status: AC
  Administered 2020-01-10: 09:00:00 40 mg

## 2020-01-10 NOTE — Progress Notes (Signed)
Pt states pain in the lower back. Pt states pain started over a month ago. Pt states heating pad helps with pain. Moving around and twisting makes pain worse.   .Numeric Pain Rating Scale and Functional Assessment Average Pain 7   In the last MONTH (on 0-10 scale) has pain interfered with the following?  1. General activity like being  able to carry out your everyday physical activities such as walking, climbing stairs, carrying groceries, or moving a chair?  Rating(8)   +Driver, +BT(coumadin, ok for inj), -Dye Allergies.

## 2020-01-10 NOTE — Telephone Encounter (Signed)
Received voicemail message from patient requesting [name] Caldron, PA-C phone him back. Phoned patient to inquire. Patient wanted to ask Ashlyn if he could wait to start treatment until after his next PSA or if he is at a point action is needed right away. Explained this RN would forward his question onto Ashlyn.   In addition, patient questions common side effects associated with prostate radiation. Reviewed potential side effects and management to include but not limited to fatigue, diarrhea, and urinary bladder changes such as frequency and urgency. Also, encouraged patient to reviewed RTanswers[employer] video about prostate radiation.  Patient verbalized understanding of all reviewed. Patient understands this RN will phone him back once Ashlyn responds to his initial question.

## 2020-01-11 DIAGNOSIS — M48062 Spinal stenosis, lumbar region with neurogenic claudication: Secondary | ICD-10-CM | POA: Insufficient documentation

## 2020-01-11 NOTE — Telephone Encounter (Signed)
I would encourage him to proceed with salvage radiation sooner than later since we have already had some delay in starting treatment since his initial consult but it is entirely up to him and his urologist. Ailene Ards

## 2020-01-11 NOTE — Progress Notes (Signed)
[name] [name] - 86 y.o. male MRN [id number]  Date of birth: 07-24-1931  Office Visit Note: Visit Date: 01/10/2020 PCP: [name], MD Referred by: [name], MD  Subjective: Chief Complaint  Patient presents with  . Lower Back - Pain   HPI:  [name] [name] is a 86 y.o. male who comes in today For planned bilateral L4 transforaminal epidural steroid injection/nerve block.  Please see our prior notes for the details and justification.  He is having a lot of low back pain to the point requiring some Vicodin.  He does have arthritis at L4-5 and L5-S1 with lateral recess narrowing.  Radiofrequency ablation of the L4-5 and L5-S1 facet joints many years ago did seem to give him some relief but since that time the epidural injection has helped more.  We will complete this today.  His injection was delayed due to weather but we will get that done today and will monitor how he does.  Neck step depending on relief would be repeat ablation.  Would also entertain the idea of repeat MRI of the lumbar spine.  ROS Otherwise per HPI.  Assessment & Plan: Visit Diagnoses:  1. Spinal stenosis of lumbar region with neurogenic claudication     Plan: No additional findings.   Meds & Orders:  Meds ordered this encounter  Medications  . methylPREDNISolone acetate (DEPO-MEDROL) injection 40 mg    Orders Placed This Encounter  Procedures  . XR C-ARM NO REPORT  . Epidural Steroid injection    Follow-up: Return if symptoms worsen or fail to improve.   Procedures: No procedures performed  Lumbosacral Transforaminal Epidural Steroid Injection - Sub-Pedicular Approach with Fluoroscopic Guidance  Patient: [name] [name]      Date of Birth: 1931/02/20 MRN: [id number] PCP: [name], MD      Visit Date: 01/10/2020   Universal Protocol:    Date/Time: 01/10/2020  Consent Given By: the patient  Position: PRONE  Additional Comments: Vital signs were monitored before and after the  procedure. Patient was prepped and draped in the usual sterile fashion. The correct patient, procedure, and site was verified.   Injection Procedure Details:  Procedure Site One Meds Administered:  Meds ordered this encounter  Medications  . methylPREDNISolone acetate (DEPO-MEDROL) injection 40 mg    Laterality: Bilateral  Location/Site:  L4-L5  Needle size: 22 G  Needle type: Spinal  Needle Placement: Transforaminal  Findings:    -Comments: Excellent flow of contrast along the nerve and into the epidural space.  Procedure Details: After squaring off the end-plates to get a true AP view, the C-arm was positioned so that an oblique view of the foramen as noted above was visualized. The target area is just inferior to the "nose of the scotty dog" or sub pedicular. The soft tissues overlying this structure were infiltrated with 2-3 ml. of 1% Lidocaine without Epinephrine.  The spinal needle was inserted toward the target using a "trajectory" view along the fluoroscope beam.  Under AP and lateral visualization, the needle was advanced so it did not puncture dura and was located close the 6 O'Clock position of the pedical in AP tracterory. Biplanar projections were used to confirm position. Aspiration was confirmed to be negative for CSF and/or blood. A 1-2 ml. volume of Isovue-250 was injected and flow of contrast was noted at each level. Radiographs were obtained for documentation purposes.   After attaining the desired flow of contrast documented above, a 0.5 to 1.0  ml test dose of 0.25% Marcaine was injected into each respective transforaminal space.  The patient was observed for 90 seconds post injection.  After no sensory deficits were reported, and normal lower extremity motor function was noted,   the above injectate was administered so that equal amounts of the injectate were placed at each foramen (level) into the transforaminal epidural space.   Additional Comments:  The  patient tolerated the procedure well Dressing: 2 x 2 sterile gauze and Band-Aid    Post-procedure details: Patient was observed during the procedure. Post-procedure instructions were reviewed.  Patient left the clinic in stable condition.     Clinical History: MRI LUMBAR SPINE WITHOUT CONTRAST  TECHNIQUE: Multiplanar, multisequence MR imaging of the lumbar spine was performed. No intravenous contrast was administered.  COMPARISON:  Lumbar radiographs 03/2010.  Lumbar MRI 11/13/2003  FINDINGS: Normal lumbar alignment. Negative for fracture or mass lesion. Schmorl's node superior endplate of L4. Conus medullaris is normal and terminates at L1-2  L1-2:  Mild disc and facet degeneration without stenosis  L2-3: Disc degeneration and spondylosis. Bilateral facet hypertrophy with mild spinal stenosis.  L3-4: Disc degeneration and spondylosis. Bilateral facet hypertrophy with mild spinal stenosis. Mild to moderate right foraminal encroachment.  L4-5: Mild disc bulging and spondylosis. Bilateral facet hypertrophy and degenerative change right greater than left. Synovial cyst on the right projects into the spinal canal and is likely causing impingement of the right L5 nerve root in the subarticular zone. In addition there is right foraminal encroachment and impingement of the right L4 nerve root. Mild left foraminal narrowing. Moderate spinal stenosis  L5-S1: Mild disc degeneration with moderate facet degeneration. No significant stenosis  IMPRESSION: Mild spinal stenosis L2-3 and L3-4 with progressive degenerative change since 2004  Moderate spinal stenosis L4-5. 10 mm right facet synovial cyst projecting into the subarticular zone and spinal canal causing impingement of the right L4 and L5 nerve roots.   Electronically Signed   By: Franchot Gallo M.D.   On: 03/20/2015 18:30     Objective:  VS:  HT:    WT:   BMI:     BP:(!) 147/74  HR:78bpm  TEMP:  ( )  RESP:  Physical Exam  Ortho Exam Imaging: XR C-ARM NO REPORT  Result Date: 01/10/2020 Please see Notes tab for imaging impression.

## 2020-01-11 NOTE — Telephone Encounter (Signed)
Closing encounter

## 2020-01-11 NOTE — Procedures (Signed)
Lumbosacral Transforaminal Epidural Steroid Injection - Sub-Pedicular Approach with Fluoroscopic Guidance  Patient: [name] [name]      Date of Birth: 28-Jul-1931 MRN: [id number] PCP: [name], MD      Visit Date: 01/10/2020   Universal Protocol:    Date/Time: 01/10/2020  Consent Given By: the patient  Position: PRONE  Additional Comments: Vital signs were monitored before and after the procedure. Patient was prepped and draped in the usual sterile fashion. The correct patient, procedure, and site was verified.   Injection Procedure Details:  Procedure Site One Meds Administered:  Meds ordered this encounter  Medications  . methylPREDNISolone acetate (DEPO-MEDROL) injection 40 mg    Laterality: Bilateral  Location/Site:  L4-L5  Needle size: 22 G  Needle type: Spinal  Needle Placement: Transforaminal  Findings:    -Comments: Excellent flow of contrast along the nerve and into the epidural space.  Procedure Details: After squaring off the end-plates to get a true AP view, the C-arm was positioned so that an oblique view of the foramen as noted above was visualized. The target area is just inferior to the "nose of the scotty dog" or sub pedicular. The soft tissues overlying this structure were infiltrated with 2-3 ml. of 1% Lidocaine without Epinephrine.  The spinal needle was inserted toward the target using a "trajectory" view along the fluoroscope beam.  Under AP and lateral visualization, the needle was advanced so it did not puncture dura and was located close the 6 O'Clock position of the pedical in AP tracterory. Biplanar projections were used to confirm position. Aspiration was confirmed to be negative for CSF and/or blood. A 1-2 ml. volume of Isovue-250 was injected and flow of contrast was noted at each level. Radiographs were obtained for documentation purposes.   After attaining the desired flow of contrast documented above, a 0.5 to 1.0 ml test dose of  0.25% Marcaine was injected into each respective transforaminal space.  The patient was observed for 90 seconds post injection.  After no sensory deficits were reported, and normal lower extremity motor function was noted,   the above injectate was administered so that equal amounts of the injectate were placed at each foramen (level) into the transforaminal epidural space.   Additional Comments:  The patient tolerated the procedure well Dressing: 2 x 2 sterile gauze and Band-Aid    Post-procedure details: Patient was observed during the procedure. Post-procedure instructions were reviewed.  Patient left the clinic in stable condition.

## 2020-01-12 ENCOUNTER — Telehealth: Payer: Self-pay | Admitting: Radiation Oncology

## 2020-01-12 NOTE — Telephone Encounter (Signed)
Phoned patient back reference his previous question if he could wait to begin radiation until after his next PSA or if he should begin treatment now. Explained to the patient that [employer], PA-C would encourage him to proceed with salvage radiation sooner than later since we have already had some delay in starting treatment since his initial consult but it is entirely up to him and his urologist. Patient verbalized understanding but verbalized disappointment. Patient goes onto say he has an appointment with Dr. Lovena Neighbours in a week and half to discuss. Encouraged patient to phone this RN with his final decision. Patient verbalized understanding and expressed appreciation for the return call.

## 2020-01-18 ENCOUNTER — Telehealth: Payer: Self-pay | Admitting: Physical Medicine and Rehabilitation

## 2020-01-18 NOTE — Telephone Encounter (Signed)
Repeat RFA, maybe include L5-S1, would need valium. Still mostly back pain? Consider PT/[facility]

## 2020-01-19 ENCOUNTER — Telehealth: Payer: Self-pay | Admitting: *Deleted

## 2020-01-19 NOTE — Telephone Encounter (Signed)
Called pt insurance [employer] and spoke with Vee P and she states no prior Josem Kaufmann is needed for [phone] and [phone]. Reference# [id number].   Pt is scheduled for 02/08/20 and 02/16/20. Pt has a driver.

## 2020-01-19 NOTE — Telephone Encounter (Signed)
Needs auth and scheduling for bilateral L4-5 and L5-S1 RFA. Patient is aware that these will be afternoon appointments and that we will schedule 2.

## 2020-01-20 ENCOUNTER — Other Ambulatory Visit: Payer: Self-pay | Admitting: Physical Medicine and Rehabilitation

## 2020-01-20 MED ORDER — HYDROCODONE-ACETAMINOPHEN 5-325 MG PO TABS: 1.0000 | ORAL_TABLET | Freq: Three times a day (TID) | ORAL | 0 refills | Status: AC | PRN

## 2020-01-20 NOTE — Progress Notes (Signed)
Pain medication until Injection/RFA. PDMP checked.

## 2020-01-20 NOTE — Telephone Encounter (Signed)
done

## 2020-01-20 NOTE — Telephone Encounter (Signed)
Called pt and advised.  

## 2020-01-25 ENCOUNTER — Other Ambulatory Visit: Payer: Self-pay

## 2020-01-25 ENCOUNTER — Ambulatory Visit (INDEPENDENT_AMBULATORY_CARE_PROVIDER_SITE_OTHER): Payer: Medicare Other | Admitting: *Deleted

## 2020-01-25 DIAGNOSIS — Z5181 Encounter for therapeutic drug level monitoring: Secondary | ICD-10-CM

## 2020-01-25 DIAGNOSIS — I4891 Unspecified atrial fibrillation: Secondary | ICD-10-CM

## 2020-01-25 LAB — POCT INR: INR: 2.2 (ref 2.0–3.0)

## 2020-01-25 NOTE — Patient Instructions (Signed)
Description   Continue on same dosage 1/2 tablet daily except 1 tablet on Mondays and Wednesdays.  Recheck INR in 6 weeks. Call [phone] if scheduled for any procedures or on any new medications

## 2020-01-26 ENCOUNTER — Other Ambulatory Visit: Payer: Self-pay | Admitting: Family Medicine

## 2020-01-27 ENCOUNTER — Encounter: Payer: Self-pay | Admitting: Urology

## 2020-01-27 NOTE — Progress Notes (Signed)
Note received from visit with Dr. Lovena Neighbours on 01/23/20 indicating that patient wishes to continue in active surveillance for now and avoid ADT as long as possible.  He will have repeat PSA every 3 months and repeat staging with CT/Bone scan in 6 months. Willing to start ADT is any evidence of metastatic disease on follow up imaging. We will continue to follow via correspondence and are happy to further participate in his care if clinically indicated in the future.  Nicholos Johns, MMS, PA-C [facility]: [phone]  Fax: [phone]

## 2020-02-08 ENCOUNTER — Encounter: Payer: Self-pay | Admitting: Physical Medicine and Rehabilitation

## 2020-02-08 ENCOUNTER — Ambulatory Visit (INDEPENDENT_AMBULATORY_CARE_PROVIDER_SITE_OTHER): Payer: Medicare Other | Admitting: Physical Medicine and Rehabilitation

## 2020-02-08 ENCOUNTER — Other Ambulatory Visit: Payer: Self-pay

## 2020-02-08 ENCOUNTER — Ambulatory Visit: Payer: Self-pay

## 2020-02-08 VITALS — BP 133/73 | HR 72

## 2020-02-08 DIAGNOSIS — M47816 Spondylosis without myelopathy or radiculopathy, lumbar region: Secondary | ICD-10-CM | POA: Diagnosis not present

## 2020-02-08 MED ORDER — METHYLPREDNISOLONE ACETATE 80 MG/ML IJ SUSP: 40.0000 mg | Freq: Once | INTRAMUSCULAR | Status: DC

## 2020-02-08 NOTE — Progress Notes (Signed)
[name] [name] - 86 y.o. male MRN [id number]  Date of birth: 1931/06/06  Office Visit Note: Visit Date: 12/07/2019 PCP: [name], MD Referred by: [name], MD  Subjective: Chief Complaint  Patient presents with  . Lower Back - Pain   HPI: [name] [name] is a 86 y.o. male who comes in today For evaluation management of chronic back pain with recent exacerbation with severe 8 out of 9 pain which is constant and sharp at this point.  No radiating symptoms in the legs.  He does endorse some leg weakness.  Has a history of polyneuropathy.  Was in a saw the patient was in 2018 and we had started him at that time on gabapentin which she has been taking and doing well with that.  We have completed radiofrequency ablation of the L4-5 and L5-S1 facet joints with good relief of his back pain.  Around that time he began having symptoms more in the buttock and he does have moderate narrowing at L4-5 but no radicular pain.  Epidural injection did seem to help the claudication symptoms to the buttock at the time but he does not have that now.  He reports no real trauma or other issues recently just exacerbated low back pain which is pretty severe.  He says getting up from a seated position to standing is really bad and standing is still very difficult.  He is using Tylenol at times as needed.  Still using the gabapentin.  He tries to remain active although it is becoming more difficult.  He has seen Dr. Joni Fears for foot pain but no evaluation of his hips etc.  He has had no other paresthesias or focal weakness just a feeling of weakness in the legs.  No red flag complaints or unintended weight loss.  He has lost weight over the years since I been seeing him.  He is a very tall individual.  He has not had x-rays in some time and do the fact that he has had a ground-level fall in the past but not recently I did elect to go ahead and look at x-rays today and those are dictated below.  Review of  Systems  Musculoskeletal: Positive for back pain.  All other systems reviewed and are negative.  Otherwise per HPI.  Assessment & Plan: Visit Diagnoses:  1. Spondylosis without myelopathy or radiculopathy, lumbar region   2. Lumbar strain, initial encounter   3. Spinal stenosis of lumbar region without neurogenic claudication   4. Weakness     Plan: Findings:  Chronic history of axial low back pain status post 2-year radiofrequency ablation was done in 2018 at L4-5 and L5-S1.  He has had more recent exacerbation without any specific trauma.  I think his symptoms are somewhat twofold I think he is getting facet mediated low back pain but he also does have a history of moderate narrowing at L4-5.  No radicular pain.  I think the first step is to provide prednisone for a few days with a small amount of hydrocodone and try to increase his activity.  The symptoms have been exacerbated over about 3 to 4 weeks and there is a chance that this starts to resolve on its own.  He is going to call us back at that time and depending on results I would consider repeating radiofrequency ablation or lumbar epidural injection.  He has continue with home exercises.    Meds & Orders:  Meds ordered this  encounter  Medications  . DISCONTD: predniSONE (DELTASONE) 50 MG tablet    Sig: Take 1 tablet daily with food for 3 days until finished    Dispense:  3 tablet    Refill:  0  . HYDROcodone-acetaminophen (NORCO/VICODIN) 5-325 MG tablet    Sig: Take 1 tablet by mouth every 8 (eight) hours as needed for up to 5 days for moderate pain or severe pain.    Dispense:  15 tablet    Refill:  0    Orders Placed This Encounter  Procedures  . XR Lumbar Spine 2-3 Views    Follow-up: Return if symptoms worsen or fail to improve.   Procedures: No procedures performed  No notes on file   Clinical History: MRI LUMBAR SPINE WITHOUT CONTRAST  TECHNIQUE: Multiplanar, multisequence MR imaging of the lumbar spine  was performed. No intravenous contrast was administered.  COMPARISON:  Lumbar radiographs 03/2010.  Lumbar MRI 11/13/2003  FINDINGS: Normal lumbar alignment. Negative for fracture or mass lesion. Schmorl's node superior endplate of L4. Conus medullaris is normal and terminates at L1-2  L1-2:  Mild disc and facet degeneration without stenosis  L2-3: Disc degeneration and spondylosis. Bilateral facet hypertrophy with mild spinal stenosis.  L3-4: Disc degeneration and spondylosis. Bilateral facet hypertrophy with mild spinal stenosis. Mild to moderate right foraminal encroachment.  L4-5: Mild disc bulging and spondylosis. Bilateral facet hypertrophy and degenerative change right greater than left. Synovial cyst on the right projects into the spinal canal and is likely causing impingement of the right L5 nerve root in the subarticular zone. In addition there is right foraminal encroachment and impingement of the right L4 nerve root. Mild left foraminal narrowing. Moderate spinal stenosis  L5-S1: Mild disc degeneration with moderate facet degeneration. No significant stenosis  IMPRESSION: Mild spinal stenosis L2-3 and L3-4 with progressive degenerative change since 2004  Moderate spinal stenosis L4-5. 10 mm right facet synovial cyst projecting into the subarticular zone and spinal canal causing impingement of the right L4 and L5 nerve roots.   Electronically Signed   By: Franchot Gallo M.D.   On: 03/20/2015 18:30   He reports that he has never smoked. He has never used smokeless tobacco. No results for input(s): HGBA1C, LABURIC in the last 8760 hours.  Objective:  VS:  HT:6\' 5"  (195.6 cm)   WT:172 lb (78 kg)  BMI:20.39    BP:134/65  HR:75bpm  TEMP: ( )  RESP:  Physical Exam Vitals and nursing note reviewed.  Constitutional:      General: He is not in acute distress.    Appearance: He is well-developed.  HENT:     Head: Normocephalic and atraumatic.      Nose: Nose normal.     Mouth/Throat:     Mouth: Mucous membranes are moist.     Pharynx: Oropharynx is clear.  Eyes:     Conjunctiva/sclera: Conjunctivae normal.     Pupils: Pupils are equal, round, and reactive to light.  Neck:     Trachea: No tracheal deviation.  Cardiovascular:     Rate and Rhythm: Normal rate and regular rhythm.     Pulses: Normal pulses.  Pulmonary:     Effort: Pulmonary effort is normal.     Breath sounds: Normal breath sounds.  Abdominal:     General: There is no distension.     Palpations: Abdomen is soft.     Tenderness: There is no guarding or rebound.  Musculoskeletal:        General:  No deformity.     Cervical back: Normal range of motion and neck supple.     Right lower leg: No edema.     Left lower leg: No edema.     Comments: Patient has pain with extension rotation and facet loading of the lumbar spine.  Some tenderness across the paraspinal region and quadratus lumborum no pain with greater trochanters no pain hip rotation good distal strength.  Skin:    General: Skin is warm and dry.     Findings: No erythema or rash.  Neurological:     General: No focal deficit present.     Mental Status: He is alert and oriented to person, place, and time.     Motor: No abnormal muscle tone.     Coordination: Coordination normal.     Gait: Gait normal.  Psychiatric:        Mood and Affect: Mood normal.        Behavior: Behavior normal.        Thought Content: Thought content normal.     Ortho Exam Imaging: AP and lateral lumbar spine x-ray shows small grade 1 listhesis of L5 on S1 with facet arthropathy L4-5 L5-S1 with degenerative disc height loss as well.  He has superior joint space loss on the left hip moderate.  He has gentle curvature of the spine convex to the left centered at about L3 L2.  Past Medical/Family/Surgical/Social History: Medications & Allergies reviewed per EMR, new medications updated. Patient Active Problem List   Diagnosis  Date Noted  . Spinal stenosis of lumbar region with neurogenic claudication 01/11/2020  . Breast mass in male 09/29/2019  . Fracture of 2nd metatarsal 06/02/2019  . Hypoxia 04/01/2019  . Knee pain, bilateral 08/08/2018  . Mass of right hand 04/12/2018  . Chronic renal insufficiency 10/06/2017  . Spondylosis without myelopathy or radiculopathy, lumbar region 09/16/2017  . Hypokalemia 04/30/2017  . Leg wound, left, sequela 04/30/2017  . Unstageable pressure ulcer of sacral region ([place]) 04/30/2017  . Fall   . Acute on chronic diastolic (congestive) heart failure ([place])   . PAH (pulmonary artery hypertension) ([place])   . Right-sided heart failure ([place]) 04/26/2017  . Chronic diastolic heart failure ([place]) 04/21/2017  . Pulmonary hypertension, primary ([place]) 04/21/2017  . Severe tricuspid regurgitation 03/12/2017  . Bilateral lower extremity edema 02/25/2017  . Anticoagulated 02/25/2017  . Varicose veins of right lower extremity with complications 0000000  . Obstructive lung disease (generalized) ([place]) 09/26/2016  . Carpal tunnel syndrome 06/25/2016  . Hereditary and idiopathic peripheral neuropathy 06/25/2016  . Paresthesia 05/27/2016  . Abnormality of gait 05/27/2016  . Paroxysmal atrial flutter ([employer]) 12/05/2015  . Constipation 06/13/2015  . Encounter for therapeutic drug monitoring 05/25/2014  . Syncope 04/03/2014  . Acute renal failure ([place]) 04/03/2014  . Leukocytosis 04/03/2014  . Low back pain 04/19/2013  . Melanoma ([place]) 09/30/2012  . Cough 03/26/2012  . Hx of mitral valve repair 11/19/2011  . Long term (current) use of anticoagulants 11/03/2011  . Pleural effusion due to congestive heart failure ([place]) 10/28/2011  . Atrial fibrillation ([place]) 10/07/2011  . S/P mitral valve repair 10/01/2011  . Valvular heart disease 08/21/2011  . Hearing loss 04/09/2011  . THROMBOCYTOPENIA 09/19/2010  . ADENOCARCINOMA, PROSTATE 09/17/2010  . CALLUS, LEFT FOOT 09/17/2010  . BACK PAIN,  CHRONIC 09/17/2010  . TINEA PEDIS 05/28/2009  . DERMATITIS, ATOPIC 04/10/2009  . HYPERCHOLESTEROLEMIA 06/09/2008  . DEPRESSION 06/09/2008  . HYPERTENSION, BENIGN ESSENTIAL 06/09/2008  . GERD 06/09/2008  .  OSTEOARTHRITIS, GENERALIZED, MULTIPLE JOINTS 06/09/2008  . MUSCLE SPASM, BACK 06/09/2008  . H/O prostate cancer 06/09/2008  . PERSONAL HISTORY OF MALIGNANT MELANOMA OF SKIN 06/09/2008  . ARRHYTHMIA, HX OF 06/09/2008  . Personal history of colonic polyps 06/09/2008   Past Medical History:  Diagnosis Date  . Abnormality of gait 05/27/2016  . Adenomatous polyps   . Carpal tunnel syndrome 06/25/2016   Right  . Depressive disorder, not elsewhere classified   . First degree AV block    Holter 3/18: NSR, PACs, PVCs, no AFib, no pauses.  Marland Kitchen Hereditary and idiopathic peripheral neuropathy 06/25/2016  . Hypertension   . Internal nasal lesion 05/15/2013  . Melanoma ([place])    Left Shoulder  . Mitral regurgitation   . MVP (mitral valve prolapse)    a. With severe MR s/p Complex valvuloplasty including artificial Gore-tex neochord placement x4, chordal transposition x1, chordal release x1, # 32 mm Sorin Memo 3D Ring Annuloplasty 2012. // b. Echo 2/18: mild LVH, EF 50-55, mild AI, MV repair with mild MR, mod LAE, mod RVE, severe RAE, severe TR  . Neuropathy   . Normal coronary arteries    a. Normal coronary anatomy by cath 2012.  . Osteoarthritis    Knees  . PAF (paroxysmal atrial fibrillation) ([place])    a. Post-op MVR 2012.  Marland Kitchen Personal history of colonic polyps   . Prostate cancer ([place])   . Pulmonary HTN ([place])    a. Mild-mod by cath 2012.  . Pure hypercholesterolemia   . PVC (premature ventricular contraction)   . Thrombocytopenia ([place])   . Vision abnormalities    Cornea scarring   Family History  Problem Relation Age of Onset  . Clotting disorder Brother        CVA's  . Arthritis Mother   . Hypertension Mother   . Stroke Mother   . Hypertension Father   . Psychosis Father         psychiatric care  . Colon cancer Neg Hx   . Stomach cancer Neg Hx   . Heart attack Neg Hx   . Prostate cancer Neg Hx   . Pancreatic cancer Neg Hx    Past Surgical History:  Procedure Laterality Date  . CARDIAC CATHETERIZATION  09/2011   Pre-op for MVR -- normal coronaries.  Marland Kitchen CARDIOVERSION N/A 01/02/2016   Procedure: CARDIOVERSION;  Surgeon: Thayer Headings, MD;  Location: [facility] ENDOSCOPY;  Service: Cardiovascular;  Laterality: N/A;  . COLONOSCOPY W/ POLYPECTOMY    . INGUINAL HERNIA REPAIR  09/2009   Left  . KNEE ARTHROSCOPY      left x3  and right x2  . Melanoma Surgery     2001, 2005, 2006, 2009  . MITRAL VALVE REPAIR  10/01/2011   complex valvuloplasty with Goretex cord replacement and chordal transposition 71mm Sorin Memo 3D ring annuloplasty  . Nuclear Stress Test  09/2006   EF-64%, Normal  . PROSTATECTOMY  1993  . RIGHT HEART CATH N/A 04/29/2017   Procedure: Right Heart Cath;  Surgeon: Jolaine Artist, MD;  Location: [facility];  Service: Cardiovascular;  Laterality: N/A;  . ROOT CANAL  08-19-12  . ROTATOR CUFF REPAIR  2003   left  . TEE WITHOUT CARDIOVERSION  09/26/2011   Procedure: TRANSESOPHAGEAL ECHOCARDIOGRAM (TEE);  Surgeon: Lelon Perla, MD;  Location: [facility] ENDOSCOPY;  Service: Cardiovascular;  Laterality: N/A;  . US ECHOCARDIOGRAPHY  09/2009, 08/1011   mild LVH,mild AI,MVP with mild MR, mild-mod. TR with mild Pulm.  HTN, EF-55-60%   Social History   Occupational History    Comment: retired  Tobacco Use  . Smoking status: Never Smoker  . Smokeless tobacco: Never Used  Substance and Sexual Activity  . Alcohol use: No    Alcohol/week: 0.0 standard drinks    Comment: Last drink in 2000  . Drug use: No  . Sexual activity: Not Currently

## 2020-02-08 NOTE — Progress Notes (Signed)
 .  Numeric Pain Rating Scale and Functional Assessment Average Pain 6   In the last MONTH (on 0-10 scale) has pain interfered with the following?  1. General activity like being  able to carry out your everyday physical activities such as walking, climbing stairs, carrying groceries, or moving a chair?  Rating(7)   +Driver, +BT(coumadin, ok for rfa), -Dye Allergies.

## 2020-02-09 NOTE — Procedures (Signed)
Lumbar Facet Joint Nerve Denervation  Patient: [name] [name]      Date of Birth: 11/28/1930 MRN: [id number] PCP: [name], MD      Visit Date: 02/08/2020   Universal Protocol:    Date/Time: 03/25/215:18 AM  Consent Given By: the patient  Position: PRONE  Additional Comments: Vital signs were monitored before and after the procedure. Patient was prepped and draped in the usual sterile fashion. The correct patient, procedure, and site was verified.   Injection Procedure Details:  Procedure Site One Meds Administered:  Meds ordered this encounter  Medications  . methylPREDNISolone acetate (DEPO-MEDROL) injection 40 mg     Laterality: Left  Location/Site:  L4-L5 L5-S1  Needle size: 18 G  Needle type: Radiofrequency cannula  Needle Placement: Along juncture of superior articular process and transverse pocess  Findings:  -Comments:  Procedure Details: For each desired target nerve, the corresponding transverse process (sacral ala for the L5 dorsal rami) was identified and the fluoroscope was positioned to square off the endplates of the corresponding vertebral body to achieve a true AP midline view.  The beam was then obliqued 15 to 20 degrees and caudally tilted 15 to 20 degrees to line up a trajectory along the target nerves. The skin over the target of the junction of superior articulating process and transverse process (sacral ala for the L5 dorsal rami) was infiltrated with 98ml of 1% Lidocaine without Epinephrine.  The 18 gauge 64mm active tip outer cannula was advanced in trajectory view to the target.  This procedure was repeated for each target nerve.  Then, for all levels, the outer cannula placement was fine-tuned and the position was then confirmed with bi-planar imaging.    Test stimulation was done both at sensory and motor levels to ensure there was no radicular stimulation. The target tissues were then infiltrated with 1 ml of 1% Lidocaine without  Epinephrine. Subsequently, a percutaneous neurotomy was carried out for 90 seconds at 80 degrees Celsius.  After the completion of the lesion, 1 ml of injectate was delivered. It was then repeated for each facet joint nerve mentioned above. Appropriate radiographs were obtained to verify the probe placement during the neurotomy.   Additional Comments:  The patient tolerated the procedure well Dressing: 2 x 2 sterile gauze and Band-Aid    Post-procedure details: Patient was observed during the procedure. Post-procedure instructions were reviewed.  Patient left the clinic in stable condition.

## 2020-02-09 NOTE — Progress Notes (Signed)
[name] [name] - 86 y.o. male MRN [id number]  Date of birth: 09/13/1931  Office Visit Note: Visit Date: 02/08/2020 PCP: [name], MD Referred by: [name], MD  Subjective: Chief Complaint  Patient presents with  . Lower Back - Pain   HPI: [name] is a 86 y.o. male who comes in today For planned left L4-5 and L5-S1 facet joint denervation or radiofrequency ablation of the corresponding L3 and L4 medial branches and L5 dorsal rami.  He is having right more than left axial back pain.  This is a repeat radiofrequency ablation prior ablation done a couple years ago did extremely well.  He has some level of moderate narrowing of the canal but no radicular pain.  He is failed conservative care with continued home management with medication and exercise.  His exercise is limited do to cardiac and pulmonary issues.  He has had no focal weakness and really has failed conservative care otherwise.  He is currently taking a small amount of hydrocodone.  ROS Otherwise per HPI.  Assessment & Plan: Visit Diagnoses:  1. Spondylosis without myelopathy or radiculopathy, lumbar region     Plan: No additional findings.   Meds & Orders:  Meds ordered this encounter  Medications  . methylPREDNISolone acetate (DEPO-MEDROL) injection 40 mg    Orders Placed This Encounter  Procedures  . Radiofrequency,Lumbar  . XR C-ARM NO REPORT    Follow-up: No follow-ups on file.   Procedures: No procedures performed  Lumbar Facet Joint Nerve Denervation  Patient: [name] [name]      Date of Birth: 28-Dec-1930 MRN: [id number] PCP: [name], MD      Visit Date: 02/08/2020   Universal Protocol:    Date/Time: 03/25/215:18 AM  Consent Given By: the patient  Position: PRONE  Additional Comments: Vital signs were monitored before and after the procedure. Patient was prepped and draped in the usual sterile fashion. The correct patient, procedure, and site was  verified.   Injection Procedure Details:  Procedure Site One Meds Administered:  Meds ordered this encounter  Medications  . methylPREDNISolone acetate (DEPO-MEDROL) injection 40 mg     Laterality: Left  Location/Site:  L4-L5 L5-S1  Needle size: 18 G  Needle type: Radiofrequency cannula  Needle Placement: Along juncture of superior articular process and transverse pocess  Findings:  -Comments:  Procedure Details: For each desired target nerve, the corresponding transverse process (sacral ala for the L5 dorsal rami) was identified and the fluoroscope was positioned to square off the endplates of the corresponding vertebral body to achieve a true AP midline view.  The beam was then obliqued 15 to 20 degrees and caudally tilted 15 to 20 degrees to line up a trajectory along the target nerves. The skin over the target of the junction of superior articulating process and transverse process (sacral ala for the L5 dorsal rami) was infiltrated with 53ml of 1% Lidocaine without Epinephrine.  The 18 gauge 12mm active tip outer cannula was advanced in trajectory view to the target.  This procedure was repeated for each target nerve.  Then, for all levels, the outer cannula placement was fine-tuned and the position was then confirmed with bi-planar imaging.    Test stimulation was done both at sensory and motor levels to ensure there was no radicular stimulation. The target tissues were then infiltrated with 1 ml of 1% Lidocaine without Epinephrine. Subsequently, a percutaneous neurotomy was carried out for 90 seconds at 80 degrees Celsius.  After the completion of the lesion, 1 ml of injectate was delivered. It was then repeated for each facet joint nerve mentioned above. Appropriate radiographs were obtained to verify the probe placement during the neurotomy.   Additional Comments:  The patient tolerated the procedure well Dressing: 2 x 2 sterile gauze and Band-Aid    Post-procedure  details: Patient was observed during the procedure. Post-procedure instructions were reviewed.  Patient left the clinic in stable condition.       Clinical History: MRI LUMBAR SPINE WITHOUT CONTRAST  TECHNIQUE: Multiplanar, multisequence MR imaging of the lumbar spine was performed. No intravenous contrast was administered.  COMPARISON:  Lumbar radiographs 03/2010.  Lumbar MRI 11/13/2003  FINDINGS: Normal lumbar alignment. Negative for fracture or mass lesion. Schmorl's node superior endplate of L4. Conus medullaris is normal and terminates at L1-2  L1-2:  Mild disc and facet degeneration without stenosis  L2-3: Disc degeneration and spondylosis. Bilateral facet hypertrophy with mild spinal stenosis.  L3-4: Disc degeneration and spondylosis. Bilateral facet hypertrophy with mild spinal stenosis. Mild to moderate right foraminal encroachment.  L4-5: Mild disc bulging and spondylosis. Bilateral facet hypertrophy and degenerative change right greater than left. Synovial cyst on the right projects into the spinal canal and is likely causing impingement of the right L5 nerve root in the subarticular zone. In addition there is right foraminal encroachment and impingement of the right L4 nerve root. Mild left foraminal narrowing. Moderate spinal stenosis  L5-S1: Mild disc degeneration with moderate facet degeneration. No significant stenosis  IMPRESSION: Mild spinal stenosis L2-3 and L3-4 with progressive degenerative change since 2004  Moderate spinal stenosis L4-5. 10 mm right facet synovial cyst projecting into the subarticular zone and spinal canal causing impingement of the right L4 and L5 nerve roots.   Electronically Signed   By: Franchot Gallo M.D.   On: 03/20/2015 18:30   He reports that he has never smoked. He has never used smokeless tobacco. No results for input(s): HGBA1C, LABURIC in the last 8760 hours.  Objective:  VS:  HT:    WT:    BMI:     BP:133/73  HR:72bpm  TEMP: ( )  RESP:  Physical Exam  Ortho Exam Imaging: XR C-ARM NO REPORT  Result Date: 02/08/2020 Please see Notes tab for imaging impression.   Past Medical/Family/Surgical/Social History: Medications & Allergies reviewed per EMR, new medications updated. Patient Active Problem List   Diagnosis Date Noted  . Spinal stenosis of lumbar region with neurogenic claudication 01/11/2020  . Breast mass in male 09/29/2019  . Fracture of 2nd metatarsal 06/02/2019  . Hypoxia 04/01/2019  . Knee pain, bilateral 08/08/2018  . Mass of right hand 04/12/2018  . Chronic renal insufficiency 10/06/2017  . Spondylosis without myelopathy or radiculopathy, lumbar region 09/16/2017  . Hypokalemia 04/30/2017  . Leg wound, left, sequela 04/30/2017  . Unstageable pressure ulcer of sacral region ([place]) 04/30/2017  . Fall   . Acute on chronic diastolic (congestive) heart failure ([place])   . PAH (pulmonary artery hypertension) ([place])   . Right-sided heart failure ([place]) 04/26/2017  . Chronic diastolic heart failure ([place]) 04/21/2017  . Pulmonary hypertension, primary ([place]) 04/21/2017  . Severe tricuspid regurgitation 03/12/2017  . Bilateral lower extremity edema 02/25/2017  . Anticoagulated 02/25/2017  . Varicose veins of right lower extremity with complications 0000000  . Obstructive lung disease (generalized) ([place]) 09/26/2016  . Carpal tunnel syndrome 06/25/2016  . Hereditary and idiopathic peripheral neuropathy 06/25/2016  . Paresthesia 05/27/2016  . Abnormality of  gait 05/27/2016  . Paroxysmal atrial flutter ([place]) 12/05/2015  . Constipation 06/13/2015  . Encounter for therapeutic drug monitoring 05/25/2014  . Syncope 04/03/2014  . Acute renal failure ([place]) 04/03/2014  . Leukocytosis 04/03/2014  . Low back pain 04/19/2013  . Melanoma ([place]) 09/30/2012  . Cough 03/26/2012  . Hx of mitral valve repair 11/19/2011  . Long term (current) use of anticoagulants 11/03/2011   . Pleural effusion due to congestive heart failure ([place]) 10/28/2011  . Atrial fibrillation ([place]) 10/07/2011  . S/P mitral valve repair 10/01/2011  . Valvular heart disease 08/21/2011  . Hearing loss 04/09/2011  . THROMBOCYTOPENIA 09/19/2010  . ADENOCARCINOMA, PROSTATE 09/17/2010  . CALLUS, LEFT FOOT 09/17/2010  . BACK PAIN, CHRONIC 09/17/2010  . TINEA PEDIS 05/28/2009  . DERMATITIS, ATOPIC 04/10/2009  . HYPERCHOLESTEROLEMIA 06/09/2008  . DEPRESSION 06/09/2008  . HYPERTENSION, BENIGN ESSENTIAL 06/09/2008  . GERD 06/09/2008  . OSTEOARTHRITIS, GENERALIZED, MULTIPLE JOINTS 06/09/2008  . MUSCLE SPASM, BACK 06/09/2008  . H/O prostate cancer 06/09/2008  . PERSONAL HISTORY OF MALIGNANT MELANOMA OF SKIN 06/09/2008  . ARRHYTHMIA, HX OF 06/09/2008  . Personal history of colonic polyps 06/09/2008   Past Medical History:  Diagnosis Date  . Abnormality of gait 05/27/2016  . Adenomatous polyps   . Carpal tunnel syndrome 06/25/2016   Right  . Depressive disorder, not elsewhere classified   . First degree AV block    Holter 3/18: NSR, PACs, PVCs, no AFib, no pauses.  Marland Kitchen Hereditary and idiopathic peripheral neuropathy 06/25/2016  . Hypertension   . Internal nasal lesion 05/15/2013  . Melanoma ([place])    Left Shoulder  . Mitral regurgitation   . MVP (mitral valve prolapse)    a. With severe MR s/p Complex valvuloplasty including artificial Gore-tex neochord placement x4, chordal transposition x1, chordal release x1, # 32 mm Sorin Memo 3D Ring Annuloplasty 2012. // b. Echo 2/18: mild LVH, EF 50-55, mild AI, MV repair with mild MR, mod LAE, mod RVE, severe RAE, severe TR  . Neuropathy   . Normal coronary arteries    a. Normal coronary anatomy by cath 2012.  . Osteoarthritis    Knees  . PAF (paroxysmal atrial fibrillation) ([place])    a. Post-op MVR 2012.  Marland Kitchen Personal history of colonic polyps   . Prostate cancer ([place])   . Pulmonary HTN ([place])    a. Mild-mod by cath 2012.  . Pure hypercholesterolemia    . PVC (premature ventricular contraction)   . Thrombocytopenia ([place])   . Vision abnormalities    Cornea scarring   Family History  Problem Relation Age of Onset  . Clotting disorder Brother        CVA's  . Arthritis Mother   . Hypertension Mother   . Stroke Mother   . Hypertension Father   . Psychosis Father        psychiatric care  . Colon cancer Neg Hx   . Stomach cancer Neg Hx   . Heart attack Neg Hx   . Prostate cancer Neg Hx   . Pancreatic cancer Neg Hx    Past Surgical History:  Procedure Laterality Date  . CARDIAC CATHETERIZATION  09/2011   Pre-op for MVR -- normal coronaries.  Marland Kitchen CARDIOVERSION N/A 01/02/2016   Procedure: CARDIOVERSION;  Surgeon: Thayer Headings, MD;  Location: [facility] ENDOSCOPY;  Service: Cardiovascular;  Laterality: N/A;  . COLONOSCOPY W/ POLYPECTOMY    . INGUINAL HERNIA REPAIR  09/2009   Left  . KNEE ARTHROSCOPY  left x3  and right x2  . Melanoma Surgery     2001, 2005, 2006, 2009  . MITRAL VALVE REPAIR  10/01/2011   complex valvuloplasty with Goretex cord replacement and chordal transposition 23mm Sorin Memo 3D ring annuloplasty  . Nuclear Stress Test  09/2006   EF-64%, Normal  . PROSTATECTOMY  1993  . RIGHT HEART CATH N/A 04/29/2017   Procedure: Right Heart Cath;  Surgeon: Jolaine Artist, MD;  Location: [facility] CV LAB;  Service: Cardiovascular;  Laterality: N/A;  . ROOT CANAL  08-19-12  . ROTATOR CUFF REPAIR  2003   left  . TEE WITHOUT CARDIOVERSION  09/26/2011   Procedure: TRANSESOPHAGEAL ECHOCARDIOGRAM (TEE);  Surgeon: Lelon Perla, MD;  Location: [facility] ENDOSCOPY;  Service: Cardiovascular;  Laterality: N/A;  . US ECHOCARDIOGRAPHY  09/2009, 08/1011   mild LVH,mild AI,MVP with mild MR, mild-mod. TR with mild Pulm. HTN, EF-55-60%   Social History   Occupational History    Comment: retired  Tobacco Use  . Smoking status: Never Smoker  . Smokeless tobacco: Never Used  Substance and Sexual Activity  . Alcohol use: No     Alcohol/week: 0.0 standard drinks    Comment: Last drink in 2000  . Drug use: No  . Sexual activity: Not Currently

## 2020-02-16 ENCOUNTER — Ambulatory Visit: Payer: Self-pay

## 2020-02-16 ENCOUNTER — Other Ambulatory Visit: Payer: Self-pay

## 2020-02-16 ENCOUNTER — Ambulatory Visit (INDEPENDENT_AMBULATORY_CARE_PROVIDER_SITE_OTHER): Payer: Medicare Other | Admitting: Physical Medicine and Rehabilitation

## 2020-02-16 ENCOUNTER — Encounter: Payer: Self-pay | Admitting: Physical Medicine and Rehabilitation

## 2020-02-16 VITALS — BP 124/84 | HR 80

## 2020-02-16 DIAGNOSIS — M47816 Spondylosis without myelopathy or radiculopathy, lumbar region: Secondary | ICD-10-CM | POA: Diagnosis not present

## 2020-02-16 MED ORDER — METHYLPREDNISOLONE ACETATE 80 MG/ML IJ SUSP
40.0000 mg | Freq: Once | INTRAMUSCULAR | Status: AC
  Administered 2020-02-16: 40 mg

## 2020-02-16 NOTE — Progress Notes (Signed)
ADRYEN JOHANSSON - 86 y.o. male MRN [id number]  Date of birth: 1931-08-16  Office Visit Note: Visit Date: 02/16/2020 PCP: [name], MD Referred by: [name], MD  Subjective: No chief complaint on file.  HPI:  NOEMI BRISSEY is a 86 y.o. male who comes in today For planned right L4-5 and L5-S1 facet joint denervation or radiofrequency ablation of the corresponding L3 and L4 medial branches and L5 dorsal rami.  He is having right more than left axial back pain.  This is a repeat radiofrequency ablation prior ablation done a couple years ago did extremely well.  He has some level of moderate narrowing of the canal but no radicular pain.  He is failed conservative care with continued home management with medication and exercise.  His exercise is limited do to cardiac and pulmonary issues.  He has had no focal weakness and really has failed conservative care otherwise.  He is currently taking a small amount of hydrocodone.   ROS Otherwise per HPI.  Assessment & Plan: Visit Diagnoses:  1. Spondylosis without myelopathy or radiculopathy, lumbar region     Plan: No additional findings.   Meds & Orders:  Meds ordered this encounter  Medications  . methylPREDNISolone acetate (DEPO-MEDROL) injection 40 mg    Orders Placed This Encounter  Procedures  . Radiofrequency,Lumbar  . XR C-ARM NO REPORT    Follow-up: Return if symptoms worsen or fail to improve.   Procedures: No procedures performed  Lumbar Facet Joint Nerve Denervation  Patient: USIEL ZUEGE      Date of Birth: 08/29/1931 MRN: [id number] PCP: [name], MD      Visit Date: 02/16/2020   Universal Protocol:    Date/Time: 04/01/216:24 PM  Consent Given By: the patient  Position: PRONE  Additional Comments: Vital signs were monitored before and after the procedure. Patient was prepped and draped in the usual sterile fashion. The correct patient, procedure, and site was verified.   Injection  Procedure Details:  Procedure Site One Meds Administered:  Meds ordered this encounter  Medications  . methylPREDNISolone acetate (DEPO-MEDROL) injection 40 mg     Laterality: Right  Location/Site:  L4-L5 L5-S1  Needle size: 18 G  Needle type: Radiofrequency cannula  Needle Placement: Along juncture of superior articular process and transverse pocess  Findings:  -Comments:  Procedure Details: For each desired target nerve, the corresponding transverse process (sacral ala for the L5 dorsal rami) was identified and the fluoroscope was positioned to square off the endplates of the corresponding vertebral body to achieve a true AP midline view.  The beam was then obliqued 15 to 20 degrees and caudally tilted 15 to 20 degrees to line up a trajectory along the target nerves. The skin over the target of the junction of superior articulating process and transverse process (sacral ala for the L5 dorsal rami) was infiltrated with 32ml of 1% Lidocaine without Epinephrine.  The 18 gauge 54mm active tip outer cannula was advanced in trajectory view to the target.  This procedure was repeated for each target nerve.  Then, for all levels, the outer cannula placement was fine-tuned and the position was then confirmed with bi-planar imaging.    Test stimulation was done both at sensory and motor levels to ensure there was no radicular stimulation. The target tissues were then infiltrated with 1 ml of 1% Lidocaine without Epinephrine. Subsequently, a percutaneous neurotomy was carried out for 90 seconds at 80 degrees Celsius.  After  the completion of the lesion, 1 ml of injectate was delivered. It was then repeated for each facet joint nerve mentioned above. Appropriate radiographs were obtained to verify the probe placement during the neurotomy.   Additional Comments:  The patient tolerated the procedure well Dressing: 2 x 2 sterile gauze and Band-Aid    Post-procedure details: Patient was  observed during the procedure. Post-procedure instructions were reviewed.  Patient left the clinic in stable condition.       Clinical History: MRI LUMBAR SPINE WITHOUT CONTRAST  TECHNIQUE: Multiplanar, multisequence MR imaging of the lumbar spine was performed. No intravenous contrast was administered.  COMPARISON:  Lumbar radiographs 03/2010.  Lumbar MRI 11/13/2003  FINDINGS: Normal lumbar alignment. Negative for fracture or mass lesion. Schmorl's node superior endplate of L4. Conus medullaris is normal and terminates at L1-2  L1-2:  Mild disc and facet degeneration without stenosis  L2-3: Disc degeneration and spondylosis. Bilateral facet hypertrophy with mild spinal stenosis.  L3-4: Disc degeneration and spondylosis. Bilateral facet hypertrophy with mild spinal stenosis. Mild to moderate right foraminal encroachment.  L4-5: Mild disc bulging and spondylosis. Bilateral facet hypertrophy and degenerative change right greater than left. Synovial cyst on the right projects into the spinal canal and is likely causing impingement of the right L5 nerve root in the subarticular zone. In addition there is right foraminal encroachment and impingement of the right L4 nerve root. Mild left foraminal narrowing. Moderate spinal stenosis  L5-S1: Mild disc degeneration with moderate facet degeneration. No significant stenosis  IMPRESSION: Mild spinal stenosis L2-3 and L3-4 with progressive degenerative change since 2004  Moderate spinal stenosis L4-5. 10 mm right facet synovial cyst projecting into the subarticular zone and spinal canal causing impingement of the right L4 and L5 nerve roots.   Electronically Signed   By: Franchot Gallo M.D.   On: 03/20/2015 18:30     Objective:  VS:  HT:    WT:   BMI:     BP:124/84  HR:80bpm  TEMP: ( )  RESP:  Physical Exam  Ortho Exam Imaging: XR C-ARM NO REPORT  Result Date: 02/16/2020 Please see Notes tab for  imaging impression.

## 2020-02-16 NOTE — Procedures (Signed)
Lumbar Facet Joint Nerve Denervation  Patient: [name] [name]      Date of Birth: Apr 21, 1931 MRN: [id number] PCP: [name], MD      Visit Date: 02/16/2020   Universal Protocol:    Date/Time: 04/01/216:24 PM  Consent Given By: the patient  Position: PRONE  Additional Comments: Vital signs were monitored before and after the procedure. Patient was prepped and draped in the usual sterile fashion. The correct patient, procedure, and site was verified.   Injection Procedure Details:  Procedure Site One Meds Administered:  Meds ordered this encounter  Medications  . methylPREDNISolone acetate (DEPO-MEDROL) injection 40 mg     Laterality: Right  Location/Site:  L4-L5 L5-S1  Needle size: 18 G  Needle type: Radiofrequency cannula  Needle Placement: Along juncture of superior articular process and transverse pocess  Findings:  -Comments:  Procedure Details: For each desired target nerve, the corresponding transverse process (sacral ala for the L5 dorsal rami) was identified and the fluoroscope was positioned to square off the endplates of the corresponding vertebral body to achieve a true AP midline view.  The beam was then obliqued 15 to 20 degrees and caudally tilted 15 to 20 degrees to line up a trajectory along the target nerves. The skin over the target of the junction of superior articulating process and transverse process (sacral ala for the L5 dorsal rami) was infiltrated with 70ml of 1% Lidocaine without Epinephrine.  The 18 gauge 49mm active tip outer cannula was advanced in trajectory view to the target.  This procedure was repeated for each target nerve.  Then, for all levels, the outer cannula placement was fine-tuned and the position was then confirmed with bi-planar imaging.    Test stimulation was done both at sensory and motor levels to ensure there was no radicular stimulation. The target tissues were then infiltrated with 1 ml of 1% Lidocaine without  Epinephrine. Subsequently, a percutaneous neurotomy was carried out for 90 seconds at 80 degrees Celsius.  After the completion of the lesion, 1 ml of injectate was delivered. It was then repeated for each facet joint nerve mentioned above. Appropriate radiographs were obtained to verify the probe placement during the neurotomy.   Additional Comments:  The patient tolerated the procedure well Dressing: 2 x 2 sterile gauze and Band-Aid    Post-procedure details: Patient was observed during the procedure. Post-procedure instructions were reviewed.  Patient left the clinic in stable condition.

## 2020-02-16 NOTE — Progress Notes (Signed)
 .  Numeric Pain Rating Scale and Functional Assessment Average Pain 5   In the last MONTH (on 0-10 scale) has pain interfered with the following?  1. General activity like being  able to carry out your everyday physical activities such as walking, climbing stairs, carrying groceries, or moving a chair?  Rating(6)   +Driver, +BT(warfarin, ok for procedure), -Dye Allergies.

## 2020-03-05 ENCOUNTER — Telehealth: Payer: Self-pay | Admitting: Physical Medicine and Rehabilitation

## 2020-03-05 NOTE — Telephone Encounter (Signed)
Pt is scheduled for 03/14/20 for an OV at [place]

## 2020-03-05 NOTE — Telephone Encounter (Signed)
Ov

## 2020-03-05 NOTE — Telephone Encounter (Signed)
Patient had RFA on the left on 3/24 and on the right on 4/1. He left a message stating that he has good days and bad days. Today he is not having much pain, but some days he does have more pain- usually on the left. He wants to know if the is "any place to go from here." Please advise.

## 2020-03-07 ENCOUNTER — Other Ambulatory Visit: Payer: Self-pay

## 2020-03-07 ENCOUNTER — Ambulatory Visit (INDEPENDENT_AMBULATORY_CARE_PROVIDER_SITE_OTHER): Payer: Medicare Other

## 2020-03-07 DIAGNOSIS — Z5181 Encounter for therapeutic drug level monitoring: Secondary | ICD-10-CM

## 2020-03-07 DIAGNOSIS — I4891 Unspecified atrial fibrillation: Secondary | ICD-10-CM | POA: Diagnosis not present

## 2020-03-07 LAB — POCT INR: INR: 2.2 (ref 2.0–3.0)

## 2020-03-07 NOTE — Patient Instructions (Signed)
Description   Continue on same dosage 1/2 tablet daily except 1 tablet on Mondays and Wednesdays.  Recheck INR in 6 weeks. Call [phone] if scheduled for any procedures or on any new medications

## 2020-03-08 ENCOUNTER — Other Ambulatory Visit: Payer: Self-pay

## 2020-03-08 ENCOUNTER — Ambulatory Visit (INDEPENDENT_AMBULATORY_CARE_PROVIDER_SITE_OTHER): Payer: Medicare Other | Admitting: Family Medicine

## 2020-03-08 ENCOUNTER — Encounter: Payer: Self-pay | Admitting: Family Medicine

## 2020-03-08 VITALS — BP 112/52 | HR 72 | Temp 98.2°F | Resp 12

## 2020-03-08 DIAGNOSIS — I5032 Chronic diastolic (congestive) heart failure: Secondary | ICD-10-CM | POA: Diagnosis not present

## 2020-03-08 DIAGNOSIS — E785 Hyperlipidemia, unspecified: Secondary | ICD-10-CM | POA: Diagnosis not present

## 2020-03-08 DIAGNOSIS — I10 Essential (primary) hypertension: Secondary | ICD-10-CM | POA: Diagnosis not present

## 2020-03-08 DIAGNOSIS — N189 Chronic kidney disease, unspecified: Secondary | ICD-10-CM

## 2020-03-08 DIAGNOSIS — I480 Paroxysmal atrial fibrillation: Secondary | ICD-10-CM

## 2020-03-08 DIAGNOSIS — E7800 Pure hypercholesterolemia, unspecified: Secondary | ICD-10-CM

## 2020-03-08 DIAGNOSIS — D649 Anemia, unspecified: Secondary | ICD-10-CM

## 2020-03-08 DIAGNOSIS — E079 Disorder of thyroid, unspecified: Secondary | ICD-10-CM

## 2020-03-08 DIAGNOSIS — M549 Dorsalgia, unspecified: Secondary | ICD-10-CM

## 2020-03-08 LAB — LIPID PANEL
Cholesterol: 141 mg/dL (ref 0–200)
HDL: 50 mg/dL (ref >39.00)
LDL Cholesterol: 69 mg/dL (ref 0–99)
NonHDL: 90.64
Total CHOL/HDL Ratio: 3
Triglycerides: 109 mg/dL (ref 0.0–149.0)
VLDL: 21.8 mg/dL (ref 0.0–40.0)

## 2020-03-08 LAB — CBC
HCT: 43.3 % (ref 39.0–52.0)
Hemoglobin: 14.7 g/dL (ref 13.0–17.0)
MCHC: 33.9 g/dL (ref 30.0–36.0)
MCV: 93.8 fl (ref 78.0–100.0)
Platelets: 55 10*3/uL — ABNORMAL LOW (ref 150.0–400.0)
RBC: 4.61 Mil/uL (ref 4.22–5.81)
RDW: 13.6 % (ref 11.5–15.5)
WBC: 6.3 10*3/uL (ref 4.0–10.5)

## 2020-03-08 LAB — COMPREHENSIVE METABOLIC PANEL
ALT: 17 U/L (ref 0–53)
AST: 17 U/L (ref 0–37)
Albumin: 4 g/dL (ref 3.5–5.2)
Alkaline Phosphatase: 38 U/L — ABNORMAL LOW (ref 39–117)
BUN: 23 mg/dL (ref 6–23)
CO2: 33 mEq/L — ABNORMAL HIGH (ref 19–32)
Calcium: 9.6 mg/dL (ref 8.4–10.5)
Chloride: 102 mEq/L (ref 96–112)
Creatinine, Ser: 0.91 mg/dL (ref 0.40–1.50)
GFR: 78.82 mL/min (ref >60.00)
Glucose, Bld: 86 mg/dL (ref 70–99)
Potassium: 4.6 mEq/L (ref 3.5–5.1)
Sodium: 139 mEq/L (ref 135–145)
Total Bilirubin: 0.9 mg/dL (ref 0.2–1.2)
Total Protein: 6.6 g/dL (ref 6.0–8.3)

## 2020-03-08 LAB — T4, FREE: Free T4: 1.18 ng/dL (ref 0.60–1.60)

## 2020-03-08 LAB — TSH: TSH: 0.87 u[IU]/mL (ref 0.35–4.50)

## 2020-03-08 MED ORDER — TIZANIDINE HCL 2 MG PO TABS: 1.0000 mg | ORAL_TABLET | Freq: Two times a day (BID) | ORAL | 0 refills | Status: DC | PRN

## 2020-03-08 MED ORDER — FERROUS FUMARATE 324 (106 FE) MG PO TABS: 1.0000 | ORAL_TABLET | Freq: Every day | ORAL | 1 refills | Status: DC

## 2020-03-08 NOTE — Patient Instructions (Signed)

## 2020-03-09 ENCOUNTER — Encounter: Payer: Self-pay | Admitting: *Deleted

## 2020-03-11 DIAGNOSIS — D649 Anemia, unspecified: Secondary | ICD-10-CM | POA: Insufficient documentation

## 2020-03-11 NOTE — Assessment & Plan Note (Signed)
Increase leafy greens, consider increased lean red meat and using cast iron cookware. Continue to monitor, report any concerns. [place]roc[place]

## 2020-03-11 NOTE — Assessment & Plan Note (Signed)
Well controlled, no changes to meds. Encouraged heart healthy diet such as the DASH diet and exercise as tolerated.  °

## 2020-03-11 NOTE — Assessment & Plan Note (Signed)
Encouraged moist heat and gentle stretching as tolerated. May try NSAIDs and prescription meds as directed and report if symptoms worsen or seek immediate care. Given rx for Tizanidine prn

## 2020-03-11 NOTE — Assessment & Plan Note (Signed)
Tolerating statin, encouraged heart healthy diet, avoid trans fats, minimize simple carbs and saturated fats. Increase exercise as tolerated 

## 2020-03-11 NOTE — Assessment & Plan Note (Signed)
Rate controlled and tolerating meds.  

## 2020-03-11 NOTE — Progress Notes (Signed)
Subjective:    Patient ID: [name] [name], male    DOB: May 23, 1931, 86 y.o.   MRN: [id number]  Chief Complaint  Patient presents with  . 5 month follow up    HPI Patient is in today for follow up on chronic medical concerns. He is doing well. Has managed quarantine well. Has had his covid shots. No recent febrile illness or hospitalizations. No polydipsia. Denies CP/palp/SOB/HA/congestion/fevers/GI or GU c/o. Taking meds as prescribed  Past Medical History:  Diagnosis Date  . Abnormality of gait 05/27/2016  . Adenomatous polyps   . Carpal tunnel syndrome 06/25/2016   Right  . Depressive disorder, not elsewhere classified   . First degree AV block    Holter 3/18: NSR, PACs, PVCs, no AFib, no pauses.  Marland Kitchen Hereditary and idiopathic peripheral neuropathy 06/25/2016  . Hypertension   . Internal nasal lesion 05/15/2013  . Melanoma ([place])    Left Shoulder  . Mitral regurgitation   . MVP (mitral valve prolapse)    a. With severe MR s/p Complex valvuloplasty including artificial Gore-tex neochord placement x4, chordal transposition x1, chordal release x1, # 32 mm Sorin Memo 3D Ring Annuloplasty 2012. // b. Echo 2/18: mild LVH, EF 50-55, mild AI, MV repair with mild MR, mod LAE, mod RVE, severe RAE, severe TR  . Neuropathy   . Normal coronary arteries    a. Normal coronary anatomy by cath 2012.  . Osteoarthritis    Knees  . PAF (paroxysmal atrial fibrillation) ([place])    a. Post-op MVR 2012.  Marland Kitchen Personal history of colonic polyps   . Prostate cancer ([place])   . Pulmonary HTN ([place])    a. Mild-mod by cath 2012.  . Pure hypercholesterolemia   . PVC (premature ventricular contraction)   . Thrombocytopenia ([place])   . Vision abnormalities    Cornea scarring    Past Surgical History:  Procedure Laterality Date  . CARDIAC CATHETERIZATION  09/2011   Pre-op for MVR -- normal coronaries.  Marland Kitchen CARDIOVERSION N/A 01/02/2016   Procedure: CARDIOVERSION;  Surgeon: Thayer Headings, MD;  Location: [facility]  ENDOSCOPY;  Service: Cardiovascular;  Laterality: N/A;  . COLONOSCOPY W/ POLYPECTOMY    . INGUINAL HERNIA REPAIR  09/2009   Left  . KNEE ARTHROSCOPY      left x3  and right x2  . Melanoma Surgery     2001, 2005, 2006, 2009  . MITRAL VALVE REPAIR  10/01/2011   complex valvuloplasty with Goretex cord replacement and chordal transposition 34mm Sorin Memo 3D ring annuloplasty  . Nuclear Stress Test  09/2006   EF-64%, Normal  . PROSTATECTOMY  1993  . RIGHT HEART CATH N/A 04/29/2017   Procedure: Right Heart Cath;  Surgeon: Jolaine Artist, MD;  Location: [place] [facility] LAB;  Service: Cardiovascular;  Laterality: N/A;  . ROOT CANAL  08-19-12  . ROTATOR CUFF REPAIR  2003   left  . TEE WITHOUT CARDIOVERSION  09/26/2011   Procedure: TRANSESOPHAGEAL ECHOCARDIOGRAM (TEE);  Surgeon: Lelon Perla, MD;  Location: [facility] ENDOSCOPY;  Service: Cardiovascular;  Laterality: N/A;  . US ECHOCARDIOGRAPHY  09/2009, 08/1011   mild LVH,mild AI,MVP with mild MR, mild-mod. TR with mild Pulm. HTN, EF-55-60%    Family History  Problem Relation Age of Onset  . Clotting disorder Brother        CVA's  . Arthritis Mother   . Hypertension Mother   . Stroke Mother   . Hypertension Father   . Psychosis Father  psychiatric care  . Colon cancer Neg Hx   . Stomach cancer Neg Hx   . Heart attack Neg Hx   . Prostate cancer Neg Hx   . Pancreatic cancer Neg Hx     Social History   Socioeconomic History  . Marital status: Widowed    Spouse name: Not on file  . Number of children: 2  . Years of education: 32  . Highest education level: Not on file  Occupational History    Comment: retired  Tobacco Use  . Smoking status: Never Smoker  . Smokeless tobacco: Never Used  Substance and Sexual Activity  . Alcohol use: No    Alcohol/week: 0.0 standard drinks    Comment: Last drink in 2000  . Drug use: No  . Sexual activity: Not Currently  Other Topics Concern  . Not on file  Social History Narrative     Retired - Optometrist   Widower   2 children (one in [place] and on one in [place])    Drinks 1 cup of coffee per day   Social Determinants of Radio broadcast assistant Strain:   . Difficulty of Paying Living Expenses:   Food Insecurity:   . Worried About Charity fundraiser in the Last Year:   . Arboriculturist in the Last Year:   Transportation Needs:   . Film/video editor (Medical):   Marland Kitchen Lack of Transportation (Non-Medical):   Physical Activity:   . Days of Exercise per Week:   . Minutes of Exercise per Session:   Stress:   . Feeling of Stress :   Social Connections:   . Frequency of Communication with Friends and Family:   . Frequency of Social Gatherings with Friends and Family:   . Attends Religious Services:   . Active Member of Clubs or Organizations:   . Attends Archivist Meetings:   Marland Kitchen Marital Status:   Intimate Partner Violence:   . Fear of Current or Ex-Partner:   . Emotionally Abused:   Marland Kitchen Physically Abused:   . Sexually Abused:     Outpatient Medications Prior to Visit  Medication Sig Dispense Refill  . acetaminophen (TYLENOL) 500 MG tablet Take 1,000 mg by mouth every 6 (six) hours as needed for mild pain.    Marland Kitchen amiodarone (PACERONE) 200 MG tablet Take 100 mg by mouth daily.    Marland Kitchen b complex vitamins tablet Take 1 tablet by mouth daily.    . cycloSPORINE (RESTASIS) 0.05 % ophthalmic emulsion Place 1 drop into both eyes 2 (two) times daily.    . fluticasone (FLONASE) 50 MCG/ACT nasal spray Place 2 sprays into both nostrils daily.   11  . gabapentin (NEURONTIN) 300 MG capsule Take 300 mg by mouth 3 (three) times daily.    Marland Kitchen KLOR-CON M20 20 MEQ tablet TAKE 2 TABLETS DAILY 180 tablet 3  . Multiple Vitamin (MULTIVITAMIN WITH MINERALS) TABS tablet Take 1 tablet by mouth every evening.     . rosuvastatin (CRESTOR) 5 MG tablet TAKE 1 TABLET DAILY 90 tablet 3  . torsemide (DEMADEX) 20 MG tablet TAKE 2 TABLETS DAILY 180 tablet 3  . traZODone  (DESYREL) 100 MG tablet Take 200 mg by mouth at bedtime.    Marland Kitchen warfarin (COUMADIN) 5 MG tablet TAKE 1 TABLET DAILY AS DIRECTED BY THE COUMADIN CLINIC 90 tablet 3  . FERROCITE 324 MG TABS tablet TAKE 1 TABLET BY MOUTH DAILY 30 tablet 0   No facility-administered  medications prior to visit.    No Known Allergies  Review of Systems  Constitutional: Positive for malaise/fatigue. Negative for fever.  HENT: Negative for congestion.   Eyes: Negative for blurred vision.  Respiratory: Negative for shortness of breath.   Cardiovascular: Negative for chest pain, palpitations and leg swelling.  Gastrointestinal: Negative for abdominal pain, blood in stool and nausea.  Genitourinary: Negative for dysuria and frequency.  Musculoskeletal: Positive for back pain. Negative for falls.  Skin: Negative for rash.  Neurological: Negative for dizziness, loss of consciousness and headaches.  Endo/Heme/Allergies: Negative for environmental allergies.  Psychiatric/Behavioral: Negative for depression. The patient is not nervous/anxious.        Objective:    Physical Exam Vitals and nursing note reviewed.  Constitutional:      General: He is not in acute distress.    Appearance: He is well-developed.  HENT:     Head: Normocephalic and atraumatic.     Nose: Nose normal.  Eyes:     General:        Right eye: No discharge.        Left eye: No discharge.  Cardiovascular:     Rate and Rhythm: Normal rate.     Heart sounds: No murmur.  Pulmonary:     Effort: Pulmonary effort is normal.     Breath sounds: Normal breath sounds.  Abdominal:     General: Bowel sounds are normal.     Palpations: Abdomen is soft.     Tenderness: There is no abdominal tenderness.  Musculoskeletal:     Cervical back: Normal range of motion and neck supple.  Skin:    General: Skin is warm and dry.  Neurological:     Mental Status: He is alert and oriented to person, place, and time.     BP (!) 112/52 (BP Location: Left  Arm, Cuff Size: Large)   Pulse 72   Temp 98.2 F (36.8 C) (Temporal)   Resp 12   SpO2 95%  Wt Readings from Last 3 Encounters:  01/04/20 175 lb 9.6 oz (79.7 kg)  12/07/19 172 lb (78 kg)  11/16/19 172 lb (78 kg)    Diabetic Foot Exam - Simple   No data filed     Lab Results  Component Value Date   WBC 6.3 03/08/2020   HGB 14.7 03/08/2020   HCT 43.3 03/08/2020   PLT 55.0 (L) 03/08/2020   GLUCOSE 86 03/08/2020   CHOL 141 03/08/2020   TRIG 109.0 03/08/2020   HDL 50.00 03/08/2020   LDLCALC 69 03/08/2020   ALT 17 03/08/2020   AST 17 03/08/2020   NA 139 03/08/2020   K 4.6 03/08/2020   CL 102 03/08/2020   CREATININE 0.91 03/08/2020   BUN 23 03/08/2020   CO2 33 (H) 03/08/2020   TSH 0.87 03/08/2020   PSA 0.07 (L) 09/17/2010   INR 2.2 03/07/2020   HGBA1C 6.0 (H) 04/03/2014    Lab Results  Component Value Date   TSH 0.87 03/08/2020   Lab Results  Component Value Date   WBC 6.3 03/08/2020   HGB 14.7 03/08/2020   HCT 43.3 03/08/2020   MCV 93.8 03/08/2020   PLT 55.0 (L) 03/08/2020   Lab Results  Component Value Date   NA 139 03/08/2020   K 4.6 03/08/2020   CO2 33 (H) 03/08/2020   GLUCOSE 86 03/08/2020   BUN 23 03/08/2020   CREATININE 0.91 03/08/2020   BILITOT 0.9 03/08/2020   ALKPHOS 38 (L) 03/08/2020  AST 17 03/08/2020   ALT 17 03/08/2020   PROT 6.6 03/08/2020   ALBUMIN 4.0 03/08/2020   CALCIUM 9.6 03/08/2020   ANIONGAP 10 01/04/2020   GFR 78.82 03/08/2020   Lab Results  Component Value Date   CHOL 141 03/08/2020   Lab Results  Component Value Date   HDL 50.00 03/08/2020   Lab Results  Component Value Date   LDLCALC 69 03/08/2020   Lab Results  Component Value Date   TRIG 109.0 03/08/2020   Lab Results  Component Value Date   CHOLHDL 3 03/08/2020   Lab Results  Component Value Date   HGBA1C 6.0 (H) 04/03/2014       Assessment & Plan:   Problem List Items Addressed This Visit    Atrial fibrillation ([place]) (Chronic)    Rate  controlled and tolerating meds      Chronic diastolic heart failure (HCC) - Primary (Chronic)   HYPERCHOLESTEROLEMIA    Tolerating statin, encouraged heart healthy diet, avoid trans fats, minimize simple carbs and saturated fats. Increase exercise as tolerated      HYPERTENSION, BENIGN ESSENTIAL    Well controlled, no changes to meds. Encouraged heart healthy diet such as the DASH diet and exercise as tolerated.       Relevant Orders   CBC (Completed)   Comprehensive metabolic panel (Completed)   Lipid panel (Completed)   Backache    Encouraged moist heat and gentle stretching as tolerated. May try NSAIDs and prescription meds as directed and report if symptoms worsen or seek immediate care. Given rx for Tizanidine prn      Relevant Medications   tiZANidine (ZANAFLEX) 2 MG tablet   Chronic renal insufficiency   Anemia    Increase leafy greens, consider increased lean red meat and using cast iron cookware. Continue to monitor, report any concerns. Ferrocite      Relevant Medications   Ferrous Fumarate (FERROCITE) 324 (106 Fe) MG TABS tablet    Other Visit Diagnoses    Thyroid disease       Relevant Orders   TSH (Completed)   T4, free (Completed)   Hyperlipidemia, unspecified hyperlipidemia type       Relevant Orders   Lipid panel (Completed)      I have changed Meta Hatchet. Pangelinan's Ferrocite to Ferrous Fumarate. I am also having him start on tiZANidine. Additionally, I am having him maintain his multivitamin with minerals, cycloSPORINE, fluticasone, b complex vitamins, acetaminophen, rosuvastatin, torsemide, Klor-Con M20, warfarin, amiodarone, gabapentin, and traZODone.  Meds ordered this encounter  Medications  . tiZANidine (ZANAFLEX) 2 MG tablet    Sig: Take 0.5-2 tablets (1-4 mg total) by mouth 2 (two) times daily as needed for muscle spasms.    Dispense:  30 tablet    Refill:  0  . Ferrous Fumarate (FERROCITE) 324 (106 Fe) MG TABS tablet    Sig: Take 1 tablet (106  mg of iron total) by mouth daily.    Dispense:  90 tablet    Refill:  1     Penni Homans, MD

## 2020-03-13 ENCOUNTER — Telehealth: Payer: Self-pay | Admitting: Family Medicine

## 2020-03-13 NOTE — Telephone Encounter (Signed)
CallerJallen Youngers  Call Back # [phone]  Subject : New Perception   Patient is requesting a new prescription sent to [facility] . [place], [address] Phone:  [phone]  Fax:  [phone]       Ferrous Fumarate (FERROCITE) 324 (106 Fe) MG TABS tablet

## 2020-03-14 ENCOUNTER — Other Ambulatory Visit: Payer: Self-pay

## 2020-03-14 ENCOUNTER — Ambulatory Visit (INDEPENDENT_AMBULATORY_CARE_PROVIDER_SITE_OTHER): Payer: Medicare Other | Admitting: Physical Medicine and Rehabilitation

## 2020-03-14 ENCOUNTER — Ambulatory Visit: Payer: Self-pay

## 2020-03-14 ENCOUNTER — Encounter: Payer: Self-pay | Admitting: Physical Medicine and Rehabilitation

## 2020-03-14 VITALS — BP 118/66 | HR 75

## 2020-03-14 DIAGNOSIS — M47816 Spondylosis without myelopathy or radiculopathy, lumbar region: Secondary | ICD-10-CM

## 2020-03-14 DIAGNOSIS — M545 Low back pain, unspecified: Secondary | ICD-10-CM

## 2020-03-14 DIAGNOSIS — M25561 Pain in right knee: Secondary | ICD-10-CM | POA: Diagnosis not present

## 2020-03-14 DIAGNOSIS — G8929 Other chronic pain: Secondary | ICD-10-CM

## 2020-03-14 DIAGNOSIS — M1711 Unilateral primary osteoarthritis, right knee: Secondary | ICD-10-CM | POA: Diagnosis not present

## 2020-03-14 MED ORDER — FERROUS FUMARATE 324 (106 FE) MG PO TABS: 1.0000 | ORAL_TABLET | Freq: Every day | ORAL | 1 refills | Status: DC

## 2020-03-14 NOTE — Progress Notes (Signed)
[name] [name] - 86 y.o. male MRN [id number]  Date of birth: 1931-02-04  Office Visit Note: Visit Date: 03/14/2020 PCP: [name], MD Referred by: [name], MD  Subjective: Chief Complaint  Patient presents with  . Lower Back - Follow-up   HPI: [name] [name] is a 86 y.o. male who comes in today For evaluation and management of 2 distinct issues.  He comes in with still chronic recalcitrant severe low back pain but is has improved to at least 50% relief after ablation.  We did perform radiofrequency ablation of the lumbar L4-5 and L5-S1 facet joints.  This was a repeat ablation that we have done in the past.  This was done with fluoroscopic guidance.  He actually did quite well and has had at least 50% overall reduction in pain.  He has now left-sided more than right side low back pain.  This is still early on in the process of the procedure.  This was completed just about 3 weeks ago.  He has had no other issues with his back.  No radicular pain.  Still reports significant weakness overall in general.  He has a secondary issue today which is acute onset of right knee pain.  History of severe recalcitrant bilateral knee arthritis and knee pain.  No new injuries.  No swelling.  No clicking or locking or instability.  Review of Systems  Musculoskeletal: Positive for back pain and joint pain.  All other systems reviewed and are negative.  Otherwise per HPI.  Assessment & Plan: Visit Diagnoses:  1. Right knee pain, unspecified chronicity   2. Spondylosis without myelopathy or radiculopathy, lumbar region   3. Chronic left-sided low back pain without sciatica   4. Unilateral primary osteoarthritis, right knee     Plan: Findings:  1.  Chronic history of back pain with facet arthropathy and degenerative disc changes without radiculopathy or stenosis.  Patient doing well with ablation of the L4-5 and L5-S1 facet joints.  At least 50% reduction at this point with left more than  right back pain.  Rena give this a few more weeks just to see how he does.  Would entertain the idea of adjunct of medication versus regrouping with physical therapy versus potential for epidural injection.  Patient has used small amount of tramadol.  2.  History of bilateral end-stage osteoarthritis of the knees.  History of injections by prior orthopedics.  Acute onset of right knee pain.  No injury and nothing significant on exam.  I feel like this is his arthritic flareup.  We will go ahead and complete diagnostic and hopefully therapeutic fluoroscopically guided knee injection today given the amount of arthritis he has.  Would have him follow-up with orthopedics for the knee.  He is actually had a history of ablation procedure to the knee and would consider that but he said it never helped before.  This ablation was not done in our office.  This was done at [facility].    Meds & Orders: No orders of the defined types were placed in this encounter.   Orders Placed This Encounter  Procedures  . Large Joint Inj: R knee  . XR C-ARM NO REPORT    Follow-up: Return if symptoms worsen or fail to improve.   Procedures: Large Joint Inj: R knee on 03/14/2020 11:40 AM Indications: pain and diagnostic evaluation Details: 22 G 3.5 in needle, fluoroscopy-guided anterolateral approach  Arthrogram: No  Medications: 40 mg triamcinolone acetonide 40 MG/ML;  4 mL bupivacaine 0.25 % Outcome: tolerated well, no immediate complications  There was excellent flow of contrast producing a partial arthrogram of the knee. The patient did have relief of symptoms during the anesthetic phase of the injection.  Consent was given by the patient. Immediately prior to procedure a time out was called to verify the correct patient, procedure, equipment, support staff and site/side marked as required. Patient was prepped and draped in the usual sterile fashion.      No notes on file   Clinical History: MRI LUMBAR SPINE  WITHOUT CONTRAST  TECHNIQUE: Multiplanar, multisequence MR imaging of the lumbar spine was performed. No intravenous contrast was administered.  COMPARISON:  Lumbar radiographs 03/2010.  Lumbar MRI 11/13/2003  FINDINGS: Normal lumbar alignment. Negative for fracture or mass lesion. Schmorl's node superior endplate of L4. Conus medullaris is normal and terminates at L1-2  L1-2:  Mild disc and facet degeneration without stenosis  L2-3: Disc degeneration and spondylosis. Bilateral facet hypertrophy with mild spinal stenosis.  L3-4: Disc degeneration and spondylosis. Bilateral facet hypertrophy with mild spinal stenosis. Mild to moderate right foraminal encroachment.  L4-5: Mild disc bulging and spondylosis. Bilateral facet hypertrophy and degenerative change right greater than left. Synovial cyst on the right projects into the spinal canal and is likely causing impingement of the right L5 nerve root in the subarticular zone. In addition there is right foraminal encroachment and impingement of the right L4 nerve root. Mild left foraminal narrowing. Moderate spinal stenosis  L5-S1: Mild disc degeneration with moderate facet degeneration. No significant stenosis  IMPRESSION: Mild spinal stenosis L2-3 and L3-4 with progressive degenerative change since 2004  Moderate spinal stenosis L4-5. 10 mm right facet synovial cyst projecting into the subarticular zone and spinal canal causing impingement of the right L4 and L5 nerve roots.   Electronically Signed   By: Franchot Gallo M.D.   On: 03/20/2015 18:30   He reports that he has never smoked. He has never used smokeless tobacco. No results for input(s): HGBA1C, LABURIC in the last 8760 hours.  Objective:  VS:  HT:    WT:   BMI:     BP:118/66  HR:75bpm  TEMP: ( )  RESP:  Physical Exam Vitals and nursing note reviewed.  Constitutional:      General: He is not in acute distress.    Appearance: Normal  appearance. He is well-developed. He is not ill-appearing.     Comments: Thin appearing  HENT:     Head: Normocephalic and atraumatic.     Right Ear: External ear normal.     Left Ear: External ear normal.     Nose: Nose normal.     Mouth/Throat:     Mouth: Mucous membranes are moist.     Pharynx: Oropharynx is clear.  Eyes:     Extraocular Movements: Extraocular movements intact.     Conjunctiva/sclera: Conjunctivae normal.     Pupils: Pupils are equal, round, and reactive to light.  Neck:     Trachea: No tracheal deviation.  Cardiovascular:     Rate and Rhythm: Normal rate and regular rhythm.     Pulses: Normal pulses.  Pulmonary:     Effort: Pulmonary effort is normal.     Breath sounds: Normal breath sounds.  Abdominal:     General: There is no distension.     Palpations: Abdomen is soft.     Tenderness: There is no guarding or rebound.  Musculoskeletal:  General: No tenderness, deformity or signs of injury.     Cervical back: Normal range of motion and neck supple.     Right lower leg: Edema present.     Left lower leg: Edema present.     Comments: Patient has good distal strength without clonus.  Examination of the right knee shows mild effusion.  There is good varus and valgus stability.  There is tenderness along the joint line.  Skin:    General: Skin is warm and dry.     Findings: No erythema or rash.  Neurological:     General: No focal deficit present.     Mental Status: He is alert and oriented to person, place, and time.     Sensory: No sensory deficit.     Motor: No weakness or abnormal muscle tone.     Coordination: Coordination normal.     Gait: Gait normal.  Psychiatric:        Mood and Affect: Mood normal.        Behavior: Behavior normal.        Thought Content: Thought content normal.     Ortho Exam  Imaging: No results found.  Past Medical/Family/Surgical/Social History: Medications & Allergies reviewed per EMR, new medications  updated. Patient Active Problem List   Diagnosis Date Noted  . Anemia 03/11/2020  . Spinal stenosis of lumbar region with neurogenic claudication 01/11/2020  . Breast mass in male 09/29/2019  . Fracture of 2nd metatarsal 06/02/2019  . Hypoxia 04/01/2019  . Knee pain, bilateral 08/08/2018  . Mass of right hand 04/12/2018  . Chronic renal insufficiency 10/06/2017  . Spondylosis without myelopathy or radiculopathy, lumbar region 09/16/2017  . Hypokalemia 04/30/2017  . Leg wound, left, sequela 04/30/2017  . Unstageable pressure ulcer of sacral region ([place]) 04/30/2017  . Fall   . Acute on chronic diastolic (congestive) heart failure ([place])   . PAH (pulmonary artery hypertension) ([place])   . Right-sided heart failure ([place]) 04/26/2017  . Chronic diastolic heart failure ([place]) 04/21/2017  . Pulmonary hypertension, primary ([place]) 04/21/2017  . Severe tricuspid regurgitation 03/12/2017  . Bilateral lower extremity edema 02/25/2017  . Anticoagulated 02/25/2017  . Varicose veins of right lower extremity with complications 0000000  . Obstructive lung disease (generalized) ([place]) 09/26/2016  . Carpal tunnel syndrome 06/25/2016  . Hereditary and idiopathic peripheral neuropathy 06/25/2016  . Paresthesia 05/27/2016  . Abnormality of gait 05/27/2016  . Constipation 06/13/2015  . Encounter for therapeutic drug monitoring 05/25/2014  . Syncope 04/03/2014  . Acute renal failure ([place]) 04/03/2014  . Low back pain 04/19/2013  . Melanoma ([place]) 09/30/2012  . Cough 03/26/2012  . Hx of mitral valve repair 11/19/2011  . Long term (current) use of anticoagulants 11/03/2011  . Pleural effusion due to congestive heart failure ([place]) 10/28/2011  . Atrial fibrillation ([place]) 10/07/2011  . S/P mitral valve repair 10/01/2011  . Valvular heart disease 08/21/2011  . Hearing loss 04/09/2011  . THROMBOCYTOPENIA 09/19/2010  . ADENOCARCINOMA, PROSTATE 09/17/2010  . CALLUS, LEFT FOOT 09/17/2010  . Backache  09/17/2010  . TINEA PEDIS 05/28/2009  . DERMATITIS, ATOPIC 04/10/2009  . HYPERCHOLESTEROLEMIA 06/09/2008  . DEPRESSION 06/09/2008  . HYPERTENSION, BENIGN ESSENTIAL 06/09/2008  . GERD 06/09/2008  . OSTEOARTHRITIS, GENERALIZED, MULTIPLE JOINTS 06/09/2008  . MUSCLE SPASM, BACK 06/09/2008  . H/O prostate cancer 06/09/2008  . PERSONAL HISTORY OF MALIGNANT MELANOMA OF SKIN 06/09/2008  . ARRHYTHMIA, HX OF 06/09/2008  . Personal history of colonic polyps 06/09/2008   Past Medical History:  Diagnosis Date  . Abnormality of gait 05/27/2016  . Adenomatous polyps   . Carpal tunnel syndrome 06/25/2016   Right  . Depressive disorder, not elsewhere classified   . First degree AV block    Holter 3/18: NSR, PACs, PVCs, no AFib, no pauses.  Marland Kitchen Hereditary and idiopathic peripheral neuropathy 06/25/2016  . Hypertension   . Internal nasal lesion 05/15/2013  . Melanoma ([place])    Left Shoulder  . Mitral regurgitation   . MVP (mitral valve prolapse)    a. With severe MR s/p Complex valvuloplasty including artificial Gore-tex neochord placement x4, chordal transposition x1, chordal release x1, # 32 mm Sorin Memo 3D Ring Annuloplasty 2012. // b. Echo 2/18: mild LVH, EF 50-55, mild AI, MV repair with mild MR, mod LAE, mod RVE, severe RAE, severe TR  . Neuropathy   . Normal coronary arteries    a. Normal coronary anatomy by cath 2012.  . Osteoarthritis    Knees  . PAF (paroxysmal atrial fibrillation) ([place])    a. Post-op MVR 2012.  Marland Kitchen Personal history of colonic polyps   . Prostate cancer ([place])   . Pulmonary HTN ([place])    a. Mild-mod by cath 2012.  . Pure hypercholesterolemia   . PVC (premature ventricular contraction)   . Thrombocytopenia ([place])   . Vision abnormalities    Cornea scarring   Family History  Problem Relation Age of Onset  . Clotting disorder Brother        CVA's  . Arthritis Mother   . Hypertension Mother   . Stroke Mother   . Hypertension Father   . Psychosis Father         psychiatric care  . Colon cancer Neg Hx   . Stomach cancer Neg Hx   . Heart attack Neg Hx   . Prostate cancer Neg Hx   . Pancreatic cancer Neg Hx    Past Surgical History:  Procedure Laterality Date  . CARDIAC CATHETERIZATION  09/2011   Pre-op for MVR -- normal coronaries.  Marland Kitchen CARDIOVERSION N/A 01/02/2016   Procedure: CARDIOVERSION;  Surgeon: Thayer Headings, MD;  Location: [facility] ENDOSCOPY;  Service: Cardiovascular;  Laterality: N/A;  . COLONOSCOPY W/ POLYPECTOMY    . INGUINAL HERNIA REPAIR  09/2009   Left  . KNEE ARTHROSCOPY      left x3  and right x2  . Melanoma Surgery     2001, 2005, 2006, 2009  . MITRAL VALVE REPAIR  10/01/2011   complex valvuloplasty with Goretex cord replacement and chordal transposition 34mm Sorin Memo 3D ring annuloplasty  . Nuclear Stress Test  09/2006   EF-64%, Normal  . PROSTATECTOMY  1993  . RIGHT HEART CATH N/A 04/29/2017   Procedure: Right Heart Cath;  Surgeon: Jolaine Artist, MD;  Location: [place] CV LAB;  Service: Cardiovascular;  Laterality: N/A;  . ROOT CANAL  08-19-12  . ROTATOR CUFF REPAIR  2003   left  . TEE WITHOUT CARDIOVERSION  09/26/2011   Procedure: TRANSESOPHAGEAL ECHOCARDIOGRAM (TEE);  Surgeon: Lelon Perla, MD;  Location: [facility] ENDOSCOPY;  Service: Cardiovascular;  Laterality: N/A;  . US ECHOCARDIOGRAPHY  09/2009, 08/1011   mild LVH,mild AI,MVP with mild MR, mild-mod. TR with mild Pulm. HTN, EF-55-60%   Social History   Occupational History    Comment: retired  Tobacco Use  . Smoking status: Never Smoker  . Smokeless tobacco: Never Used  Vaping Use  . Vaping Use: Never used  Substance and Sexual Activity  . Alcohol  use: No    Alcohol/week: 0.0 standard drinks    Comment: Last drink in 2000  . Drug use: No  . Sexual activity: Not Currently

## 2020-03-14 NOTE — Telephone Encounter (Signed)
Patient stated that rx was not covered thru [facility] mail order and wanted it sent to [facility].  rx [facility] to [facility].

## 2020-03-14 NOTE — Progress Notes (Signed)
Pt states he has had 50% improvement Pt has new pain in right knee and would like a cortisone injection Pt states pain is in left side of lower back. No leg pain  Numeric Pain Rating Scale and Functional Assessment Average Pain (5)   In the last MONTH (on 0-10 scale) has pain interfered with the following?  1. General activity like being  able to carry out your everyday physical activities such as walking, climbing stairs, carrying groceries, or moving a chair?  Rating(5)

## 2020-03-15 ENCOUNTER — Other Ambulatory Visit: Payer: Self-pay | Admitting: Cardiology

## 2020-03-20 LAB — PSA: PSA: 2.67

## 2020-03-22 ENCOUNTER — Encounter: Payer: Self-pay | Admitting: Family Medicine

## 2020-04-05 ENCOUNTER — Telehealth: Payer: Self-pay | Admitting: Physical Medicine and Rehabilitation

## 2020-04-05 NOTE — Telephone Encounter (Signed)
Knee's typically once every 3 to 4 months, I would f/up with Dr. Durward Fortes if needed they may consider visco-supplementation. We will note the medication and should keep those under close protection/security
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
## 2020-04-06 NOTE — Telephone Encounter (Signed)
Called patient to advise  °

## 2020-04-13 ENCOUNTER — Other Ambulatory Visit: Payer: Self-pay | Admitting: Cardiology

## 2020-04-13 ENCOUNTER — Other Ambulatory Visit (HOSPITAL_COMMUNITY): Payer: Self-pay | Admitting: Adult Health

## 2020-04-18 ENCOUNTER — Other Ambulatory Visit: Payer: Self-pay

## 2020-04-18 ENCOUNTER — Ambulatory Visit (INDEPENDENT_AMBULATORY_CARE_PROVIDER_SITE_OTHER): Payer: Medicare Other

## 2020-04-18 DIAGNOSIS — Z5181 Encounter for therapeutic drug level monitoring: Secondary | ICD-10-CM

## 2020-04-18 DIAGNOSIS — I4891 Unspecified atrial fibrillation: Secondary | ICD-10-CM

## 2020-04-18 LAB — POCT INR: INR: 1.9 — AB (ref 2.0–3.0)

## 2020-04-18 NOTE — Patient Instructions (Signed)
Description   Take 1 tablet today and tomorrow, then resume same dosage 1/2 tablet daily except 1 tablet on Mondays and Wednesdays.  Recheck INR in 5 weeks. Call [phone] if scheduled for any procedures or on any new medications

## 2020-05-16 ENCOUNTER — Telehealth: Payer: Self-pay | Admitting: Hematology & Oncology

## 2020-05-16 ENCOUNTER — Encounter: Payer: Self-pay | Admitting: Hematology & Oncology

## 2020-05-16 ENCOUNTER — Inpatient Hospital Stay: Payer: Medicare Other | Attending: Hematology & Oncology | Admitting: Hematology & Oncology

## 2020-05-16 ENCOUNTER — Inpatient Hospital Stay: Payer: Medicare Other

## 2020-05-16 ENCOUNTER — Other Ambulatory Visit: Payer: Self-pay

## 2020-05-16 VITALS — BP 105/57 | HR 74 | Temp 98.2°F | Resp 18

## 2020-05-16 DIAGNOSIS — D500 Iron deficiency anemia secondary to blood loss (chronic): Secondary | ICD-10-CM

## 2020-05-16 DIAGNOSIS — M2550 Pain in unspecified joint: Secondary | ICD-10-CM | POA: Diagnosis not present

## 2020-05-16 DIAGNOSIS — D696 Thrombocytopenia, unspecified: Secondary | ICD-10-CM

## 2020-05-16 DIAGNOSIS — Z79899 Other long term (current) drug therapy: Secondary | ICD-10-CM | POA: Diagnosis not present

## 2020-05-16 DIAGNOSIS — Z7901 Long term (current) use of anticoagulants: Secondary | ICD-10-CM | POA: Diagnosis not present

## 2020-05-16 DIAGNOSIS — G629 Polyneuropathy, unspecified: Secondary | ICD-10-CM | POA: Insufficient documentation

## 2020-05-16 DIAGNOSIS — D508 Other iron deficiency anemias: Secondary | ICD-10-CM

## 2020-05-16 LAB — CMP (CANCER CENTER ONLY)
ALT: 11 U/L (ref 0–44)
AST: 16 U/L (ref 15–41)
Albumin: 4 g/dL (ref 3.5–5.0)
Alkaline Phosphatase: 41 U/L (ref 38–126)
Anion gap: 4 — ABNORMAL LOW (ref 5–15)
BUN: 24 mg/dL — ABNORMAL HIGH (ref 8–23)
CO2: 34 mmol/L — ABNORMAL HIGH (ref 22–32)
Calcium: 10.2 mg/dL (ref 8.9–10.3)
Chloride: 104 mmol/L (ref 98–111)
Creatinine: 1.21 mg/dL (ref 0.61–1.24)
GFR, Est AFR Am: >60 mL/min (ref >60)
GFR, Estimated: 54 mL/min — ABNORMAL LOW (ref >60)
Glucose, Bld: 74 mg/dL (ref 70–99)
Potassium: 5.1 mmol/L (ref 3.5–5.1)
Sodium: 142 mmol/L (ref 135–145)
Total Bilirubin: 0.8 mg/dL (ref 0.3–1.2)
Total Protein: 7 g/dL (ref 6.5–8.1)

## 2020-05-16 LAB — CBC WITH DIFFERENTIAL (CANCER CENTER ONLY)
Abs Immature Granulocytes: 0.06 10*3/uL (ref 0.00–0.07)
Basophils Absolute: 0 10*3/uL (ref 0.0–0.1)
Basophils Relative: 0 %
Eosinophils Absolute: 0 10*3/uL (ref 0.0–0.5)
Eosinophils Relative: 0 %
HCT: 44.9 % (ref 39.0–52.0)
Hemoglobin: 14.6 g/dL (ref 13.0–17.0)
Immature Granulocytes: 1 %
Lymphocytes Relative: 23 %
Lymphs Abs: 1.7 10*3/uL (ref 0.7–4.0)
MCH: 31.5 pg (ref 26.0–34.0)
MCHC: 32.5 g/dL (ref 30.0–36.0)
MCV: 96.8 fL (ref 80.0–100.0)
Monocytes Absolute: 1.5 10*3/uL — ABNORMAL HIGH (ref 0.1–1.0)
Monocytes Relative: 20 %
Neutro Abs: 4.1 10*3/uL (ref 1.7–7.7)
Neutrophils Relative %: 56 %
Platelet Count: 78 10*3/uL — ABNORMAL LOW (ref 150–400)
RBC: 4.64 MIL/uL (ref 4.22–5.81)
RDW: 12.6 % (ref 11.5–15.5)
WBC Count: 7.3 10*3/uL (ref 4.0–10.5)
nRBC: 0 % (ref 0.0–0.2)

## 2020-05-16 LAB — RETICULOCYTES
Immature Retic Fract: 7.1 % (ref 2.3–15.9)
RBC.: 4.66 MIL/uL (ref 4.22–5.81)
Retic Count, Absolute: 79.7 10*3/uL (ref 19.0–186.0)
Retic Ct Pct: 1.7 % (ref 0.4–3.1)

## 2020-05-16 LAB — SAVE SMEAR(SSMR), FOR PROVIDER SLIDE REVIEW

## 2020-05-16 NOTE — Progress Notes (Signed)
Hematology and Oncology Follow Up Visit  [name] [name] [id number] [birth year] 86 y.o. 05/16/2020   Principle Diagnosis:   Chronic thrombocytopenia-immune-based versus medication  Current Therapy:    Observation     Interim History:  [name] [name] is back for follow-up.   Overall, he seems to be doing pretty well.  He is on Coumadin.  He bruises quite a bit.  There has been no bleeding.  He has had his coronavirus vaccination.  So far, he has been diligent in trying to avoid the coronavirus.  He has had no heart difficulty.  He now is off Toprol XL.  There has been no issues with cough.  He has had no rashes.  He has had no change in bowel or bladder habits.  He still is in the wheelchair.  Thankfully, we did go ahead and talk quite a bit about football.  We also talked about Debby Bud.  He has a neuropathy.  I think that he is seen a neurologist for this.  I am not sure where he stands with respect to the prostate cancer.    Overall, his performance status is ECOG 2.    Medications:  Current Outpatient Medications:  .  acetaminophen (TYLENOL) 500 MG tablet, Take 1,000 mg by mouth every 6 (six) hours as needed for mild pain., Disp: , Rfl:  .  amiodarone (PACERONE) 200 MG tablet, Take 100 mg by mouth daily., Disp: , Rfl:  .  b complex vitamins tablet, Take 1 tablet by mouth daily., Disp: , Rfl:  .  cycloSPORINE (RESTASIS) 0.05 % ophthalmic emulsion, Place 1 drop into both eyes 2 (two) times daily., Disp: , Rfl:  .  Ferrous Fumarate (FERROCITE) 324 (106 Fe) MG TABS tablet, Take 1 tablet (106 mg of iron total) by mouth daily., Disp: 90 tablet, Rfl: 1 .  fluticasone (FLONASE) 50 MCG/ACT nasal spray, Place 2 sprays into both nostrils daily. , Disp: , Rfl: 11 .  gabapentin (NEURONTIN) 300 MG capsule, Take 300 mg by mouth 3 (three) times daily., Disp: , Rfl:  .  HYDROcodone-acetaminophen (NORCO/VICODIN) 5-325 MG tablet, , Disp: , Rfl:  .  KLOR-CON M20 20 MEQ tablet, TAKE 2  TABLETS DAILY, Disp: 180 tablet, Rfl: 3 .  Multiple Vitamin (MULTIVITAMIN WITH MINERALS) TABS tablet, Take 1 tablet by mouth every evening. , Disp: , Rfl:  .  rosuvastatin (CRESTOR) 5 MG tablet, TAKE 1 TABLET BY MOUTH  DAILY, Disp: 90 tablet, Rfl: 3 .  torsemide (DEMADEX) 20 MG tablet, TAKE 2 TABLETS BY MOUTH  DAILY, Disp: 180 tablet, Rfl: 3 .  traZODone (DESYREL) 100 MG tablet, Take 200 mg by mouth at bedtime., Disp: , Rfl:  .  warfarin (COUMADIN) 5 MG tablet, TAKE 1 TABLET DAILY AS DIRECTED BY THE COUMADIN CLINIC, Disp: 90 tablet, Rfl: 3 .  metoprolol succinate (TOPROL-XL) 50 MG 24 hr tablet, TAKE 1 TABLET DAILY WITH OR IMMEDIATELY FOLLOWING A  MEAL., Disp: 90 tablet, Rfl: 3 .  tiZANidine (ZANAFLEX) 2 MG tablet, Take 0.5-2 tablets (1-4 mg total) by mouth 2 (two) times daily as needed for muscle spasms., Disp: 30 tablet, Rfl: 0  Allergies: No Known Allergies  Past Medical History, Surgical history, Social history, and Family History were reviewed and updated.  Review of Systems: Review of Systems  Constitutional: Negative.   HENT:  Negative.   Eyes: Negative.   Respiratory: Negative.   Cardiovascular: Negative.   Gastrointestinal: Negative.   Endocrine: Negative.   Genitourinary: Negative.  Musculoskeletal: Positive for arthralgias.  Skin: Negative.   Neurological: Negative.   Hematological: Negative.   Psychiatric/Behavioral: Negative.     Physical Exam:  oral temperature is 98.2 F (36.8 C). His blood pressure is 105/57 (abnormal) and his pulse is 74. His respiration is 18 and oxygen saturation is 98%.   Wt Readings from Last 3 Encounters:  01/04/20 175 lb 9.6 oz (79.7 kg)  12/07/19 172 lb (78 kg)  11/16/19 172 lb (78 kg)    Physical Exam Vitals reviewed.  HENT:     Head: Normocephalic and atraumatic.  Eyes:     Pupils: Pupils are equal, round, and reactive to light.  Cardiovascular:     Rate and Rhythm: Normal rate and regular rhythm.     Heart sounds: Normal  heart sounds.  Pulmonary:     Effort: Pulmonary effort is normal.     Breath sounds: Normal breath sounds.  Abdominal:     General: Bowel sounds are normal.     Palpations: Abdomen is soft.  Musculoskeletal:        General: No tenderness or deformity. Normal range of motion.     Cervical back: Normal range of motion.  Lymphadenopathy:     Cervical: No cervical adenopathy.  Skin:    General: Skin is warm and dry.     Findings: No erythema or rash.  Neurological:     Mental Status: He is alert and oriented to person, place, and time.  Psychiatric:        Behavior: Behavior normal.        Thought Content: Thought content normal.        Judgment: Judgment normal.      Lab Results  Component Value Date   WBC 7.3 05/16/2020   HGB 14.6 05/16/2020   HCT 44.9 05/16/2020   MCV 96.8 05/16/2020   PLT 78 (L) 05/16/2020     Chemistry      Component Value Date/Time   NA 142 05/16/2020 1132   NA 143 02/23/2017 1103   K 5.1 05/16/2020 1132   CL 104 05/16/2020 1132   CO2 34 (H) 05/16/2020 1132   BUN 24 (H) 05/16/2020 1132   BUN 25 02/23/2017 1103   CREATININE 1.21 05/16/2020 1132   CREATININE 1.06 08/25/2016 1339      Component Value Date/Time   CALCIUM 10.2 05/16/2020 1132   ALKPHOS 41 05/16/2020 1132   AST 16 05/16/2020 1132   ALT 11 05/16/2020 1132   BILITOT 0.8 05/16/2020 1132       Impression and Plan: [name] [name] is a 86 year old white male.  He has mild thrombocytopenia.  It is no surprise that the platelet count fluctuates.  I think this is how it will always be for [name] [name].  Again the bruising is no surprise.  I do not see a problem with him being on Coumadin.  We will still plan to get him back in 6 months.    Volanda Napoleon, MD 6/30/202112:47 PM

## 2020-05-16 NOTE — Telephone Encounter (Signed)
Appointments scheduled calendar printed per 6/30 los

## 2020-05-17 LAB — IRON AND TIBC
Iron: 108 ug/dL (ref 42–163)
Saturation Ratios: 37 % (ref 20–55)
TIBC: 293 ug/dL (ref 202–409)
UIBC: 184 ug/dL (ref 117–376)

## 2020-05-17 LAB — FERRITIN: Ferritin: 49 ng/mL (ref 24–336)

## 2020-05-23 ENCOUNTER — Other Ambulatory Visit: Payer: Self-pay

## 2020-05-23 ENCOUNTER — Ambulatory Visit (INDEPENDENT_AMBULATORY_CARE_PROVIDER_SITE_OTHER): Payer: Medicare Other | Admitting: *Deleted

## 2020-05-23 DIAGNOSIS — I4891 Unspecified atrial fibrillation: Secondary | ICD-10-CM | POA: Diagnosis not present

## 2020-05-23 DIAGNOSIS — Z5181 Encounter for therapeutic drug level monitoring: Secondary | ICD-10-CM | POA: Diagnosis not present

## 2020-05-23 LAB — POCT INR: INR: 2 (ref 2.0–3.0)

## 2020-05-23 NOTE — Patient Instructions (Addendum)
Description   Start taking Warfarin 1/2 tablet daily except 1 tablet on Mondays, Wednesdays, and Fridays.  Recheck INR in 4 weeks. Call [phone] if scheduled for any procedures or on any new medications

## 2020-05-23 NOTE — Progress Notes (Signed)
I connected with [name] today by telephone and verified that I am speaking with the correct person using two identifiers. Location patient: home Location provider: work Persons participating in the virtual visit: patient, Marine scientist.    I discussed the limitations, risks, security and privacy concerns of performing an evaluation and management service by telephone and the availability of in person appointments. I also discussed with the patient that there may be a patient responsible charge related to this service. The patient expressed understanding and verbally consented to this telephonic visit.    Interactive audio and video telecommunications were attempted between this provider and patient, however failed, due to patient having technical difficulties OR patient did not have access to video capability.  We continued and completed visit with audio only.  Some vital signs may be absent or patient reported.      Subjective:   [name] [name] is a 86 y.o. male who presents for Medicare Annual/Subsequent preventive examination.  Review of Systems     Cardiac Risk Factors include: advanced age (>45men, >39 women);male gender;hypertension     Objective:     Advanced Directives 05/24/2020 05/16/2020 01/06/2020 11/16/2019 06/14/2019 05/23/2019 04/01/2019  Does Patient Have a Medical Advance Directive? Yes Yes No Yes Yes Yes Yes  Type of Paramedic of [place];Living will [place];Living will - [place];Living will - [place];Living will -  Does patient want to make changes to medical advance directive? No - Patient declined No - Patient declined - - No - Patient declined No - Patient declined No - Patient declined  Copy of [place] in Chart? Yes - validated most recent copy scanned in chart (See row information) No - copy requested No - copy requested Yes - validated most recent copy scanned in chart  (See row information) - Yes - validated most recent copy scanned in chart (See row information) -  Would patient like information on creating a medical advance directive? - - No - Patient declined - - - -  Pre-existing out of facility DNR order (yellow form or pink MOST form) - - - - - - -    Current Medications (verified) Outpatient Encounter Medications as of 05/24/2020  Medication Sig  . acetaminophen (TYLENOL) 500 MG tablet Take 1,000 mg by mouth every 6 (six) hours as needed for mild pain.  Marland Kitchen amiodarone (PACERONE) 200 MG tablet Take 100 mg by mouth daily.  Marland Kitchen b complex vitamins tablet Take 1 tablet by mouth daily.  . cycloSPORINE (RESTASIS) 0.05 % ophthalmic emulsion Place 1 drop into both eyes 2 (two) times daily.  . Ferrous Fumarate (FERROCITE) 324 (106 Fe) MG TABS tablet Take 1 tablet (106 mg of iron total) by mouth daily.  . fluticasone (FLONASE) 50 MCG/ACT nasal spray Place 2 sprays into both nostrils daily.   Marland Kitchen gabapentin (NEURONTIN) 300 MG capsule Take 300 mg by mouth 3 (three) times daily.  Marland Kitchen HYDROcodone-acetaminophen (NORCO/VICODIN) 5-325 MG tablet   . KLOR-CON M20 20 MEQ tablet TAKE 2 TABLETS DAILY  . Multiple Vitamin (MULTIVITAMIN WITH MINERALS) TABS tablet Take 1 tablet by mouth every evening.   . rosuvastatin (CRESTOR) 5 MG tablet TAKE 1 TABLET BY MOUTH  DAILY  . torsemide (DEMADEX) 20 MG tablet TAKE 2 TABLETS BY MOUTH  DAILY  . traZODone (DESYREL) 100 MG tablet Take 200 mg by mouth at bedtime.  Marland Kitchen warfarin (COUMADIN) 5 MG tablet TAKE 1 TABLET DAILY AS DIRECTED BY THE  COUMADIN CLINIC  . [DISCONTINUED] metoprolol succinate (TOPROL-XL) 50 MG 24 hr tablet TAKE 1 TABLET DAILY WITH OR IMMEDIATELY FOLLOWING A  MEAL.  . [DISCONTINUED] pantoprazole (PROTONIX) 40 MG tablet Take 1 tablet (40 mg total) by mouth daily before breakfast.  . [DISCONTINUED] tiZANidine (ZANAFLEX) 2 MG tablet Take 0.5-2 tablets (1-4 mg total) by mouth 2 (two) times daily as needed for muscle spasms. (Patient not  taking: Reported on 05/24/2020)   No facility-administered encounter medications on file as of 05/24/2020.    Allergies (verified) Patient has no known allergies.   History: Past Medical History:  Diagnosis Date  . Abnormality of gait 05/27/2016  . Adenomatous polyps   . Carpal tunnel syndrome 06/25/2016   Right  . Depressive disorder, not elsewhere classified   . First degree AV block    Holter 3/18: NSR, PACs, PVCs, no AFib, no pauses.  Marland Kitchen Hereditary and idiopathic peripheral neuropathy 06/25/2016  . Hypertension   . Internal nasal lesion 05/15/2013  . Melanoma ([place])    Left Shoulder  . Mitral regurgitation   . MVP (mitral valve prolapse)    a. With severe MR s/p Complex valvuloplasty including artificial Gore-tex neochord placement x4, chordal transposition x1, chordal release x1, # 32 mm Sorin Memo 3D Ring Annuloplasty 2012. // b. Echo 2/18: mild LVH, EF 50-55, mild AI, MV repair with mild MR, mod LAE, mod RVE, severe RAE, severe TR  . Neuropathy   . Normal coronary arteries    a. Normal coronary anatomy by cath 2012.  . Osteoarthritis    Knees  . PAF (paroxysmal atrial fibrillation) ([place])    a. Post-op MVR 2012.  Marland Kitchen Personal history of colonic polyps   . Prostate cancer ([place])   . Pulmonary HTN ([place])    a. Mild-mod by cath 2012.  . Pure hypercholesterolemia   . PVC (premature ventricular contraction)   . Thrombocytopenia ([place])   . Vision abnormalities    Cornea scarring   Past Surgical History:  Procedure Laterality Date  . CARDIAC CATHETERIZATION  09/2011   Pre-op for MVR -- normal coronaries.  Marland Kitchen CARDIOVERSION N/A 01/02/2016   Procedure: CARDIOVERSION;  Surgeon: Thayer Headings, MD;  [facility]COPY;  Service: Cardiovascular;  Laterality: N/A;  . COLONOSCOPY W/ POLYPECTOMY    . INGUINAL HERNIA REPAIR  09/2009   Left  . KNEE ARTHROSCOPY      left x3  and right x2  . Melanoma Surgery     2001, 2005, 2006, 2009  . MITRAL VALVE REPAIR  10/01/2011   complex  valvuloplasty with Goretex cord replacement and chordal transposition 47mm Sorin Memo 3D ring annuloplasty  . Nuclear Stress Test  09/2006   EF-64%, Normal  . PROSTATECTOMY  1993  . RIGHT HEART CATH N/A 04/29/2017   Procedure: Right Heart Cath;  Surgeon: Jolaine Artist, MD;  Location: [facility] CV LAB;  Service: Cardiovascular;  Laterality: N/A;  . ROOT CANAL  08-19-12  . ROTATOR CUFF REPAIR  2003   left  . TEE WITHOUT CARDIOVERSION  09/26/2011   Procedure: TRANSESOPHAGEAL ECHOCARDIOGRAM (TEE);  Surgeon: Lelon Perla, MD;  Location[facility] END[facility]COPY;  Service: Cardiovascular;  Laterality: N/A;  . US ECHOCARDIOGRAPHY  09/2009, 08/1011   mild LVH,mild AI,MVP with mild MR, mild-mod. TR with mild Pulm. HTN, EF-55-60%   Family History  Problem Relation Age of Onset  . Clotting disorder Brother        CVA's  . Arthritis Mother   . Hypertension Mother   .  Stroke Mother   . Hypertension Father   . Psychosis Father        psychiatric care  . Colon cancer Neg Hx   . Stomach cancer Neg Hx   . Heart attack Neg Hx   . Prostate cancer Neg Hx   . Pancreatic cancer Neg Hx    Social History   Socioeconomic History  . Marital status: Widowed    Spouse name: Not on file  . Number of children: 2  . Years of education: 47  . Highest education level: Not on file  Occupational History    Comment: retired  Tobacco Use  . Smoking status: Never Smoker  . Smokeless tobacco: Never Used  Vaping Use  . Vaping Use: Never used  Substance and Sexual Activity  . Alcohol use: No    Alcohol/week: 0.0 standard drinks    Comment: Last drink in 2000  . Drug use: No  . Sexual activity: Not Currently  Other Topics Concern  . Not on file  Social History Narrative   Retired - Optometrist   Widower   2 children (one in [place] and on one in [place])    Drinks 1 cup of coffee per day   Social Determinants of Radio broadcast assistant Strain: Low Risk   . Difficulty of Paying Living Expenses:  Not hard at all  Food Insecurity: No Food Insecurity  . Worried About Charity fundraiser in the Last Year: Never true  . Ran Out of Food in the Last Year: Never true  Transportation Needs: No Transportation Needs  . Lack of Transportation (Medical): No  . Lack of Transportation (Non-Medical): No  Physical Activity:   . Days of Exercise per Week:   . Minutes of Exercise per Session:   Stress:   . Feeling of Stress :   Social Connections:   . Frequency of Communication with Friends and Family:   . Frequency of Social Gatherings with Friends and Family:   . Attends Religious Services:   . Active Member of Clubs or Organizations:   . Attends Archivist Meetings:   Marland Kitchen Marital Status:     Tobacco Counseling Counseling given: Not Answered   Clinical Intake:     Pain : No/denies pain                 Activities of Daily Living In your present state of health, do you have any difficulty performing the following activities: 05/24/2020  Hearing? N  Vision? N  Difficulty concentrating or making decisions? N  Walking or climbing stairs? Y  Dressing or bathing? Y  Doing errands, shopping? Y  Preparing Food and eating ? Y  Using the Toilet? N  In the past six months, have you accidently leaked urine? N  Do you have problems with loss of bowel control? N  Managing your Medications? N  Managing your Finances? N  Housekeeping or managing your Housekeeping? Y  Some recent data might be hidden    Patient Care Team: [name], MD as PCP - General (Family Medicine) Bensimhon, Shaune Pascal, MD as PCP - Advanced Heart Failure (Cardiology) [name], MD as PCP - Cardiology (Cardiology) Darlin Coco, MD as Referring Physician (Cardiology) Willodean Rosenthal, MD as Consulting Physician (Internal Medicine) Dillingham, Loel Lofty, DO as Consulting Physician (Plastic Surgery) Magnus Sinning, MD as Consulting Physician [name], MD as Consulting  Physician (Cardiology) Bensimhon, Shaune Pascal, MD as Consulting Physician (Cardiology) Danella Sensing,  MD as Consulting Physician (Dermatology) Eppie Gibson as Physician Assistant (Physician Assistant) Ceasar Mons, MD as Consulting Physician (Urology) Garald Balding, MD as Consulting Physician (Orthopedic Surgery) Cira Rue, RN Nurse Navigator as Registered Nurse (Medical Oncology)  Indicate any recent [place] you may have received from other than [facility] providers in the past year (date may be approximate).     Assessment:   This is a routine wellness examination for [place].  Dietary issues and exercise activities discussed: Current Exercise Habits: The patient does not participate in regular exercise at present, Exercise limited by: orthopedic condition(s)  Diet (meal preparation, eat out, water intake, caffeinated beverages, dairy products, fruits and vegetables): well balanced    Goals    . Continue to eat a well balanced diet.    . Gain weight      Depression Screen PHQ 2/9 Scores 05/24/2020 05/23/2019 05/18/2018 09/22/2017 01/11/2015 05/28/2012  PHQ - 2 Score 0 0 0 0 0 1    Fall Risk Fall Risk  05/24/2020 05/23/2019 05/18/2018 09/22/2017 01/11/2015  Falls in the past year? 0 0 Yes Yes Yes  Number falls in past yr: 0 - 2 or more 2 or more 1  Injury with Fall? 0 - No No No  Risk for fall due to : - - - History of fall(s) (No Data)  Risk for fall due to: Comment - - - - Syncope  Follow up Education provided;Falls prevention discussed - Education provided;Falls prevention discussed - -   Lives alone in 2 story home. Uses walker. Has stair lift and shower chair. Keeps urinal at bedside. Had aide that comes 7 hrs/ day to assist with ADLs, meals, and cleaning. Any stairs in or around the home? Yes  If so, are there any without handrails? No  Home free of loose throw rugs in walkways, pet beds, electrical cords, etc? Yes  Adequate lighting in your home to reduce  risk of falls? Yes    Cognitive Function: Ad8 score reviewed for issues:  Issues making decisions:no  Less interest in hobbies / activities:no  Repeats questions, stories (family complaining):no  Trouble using ordinary gadgets (microwave, computer, phone):no  Forgets the month or year: no  Mismanaging finances: no  Remembering appts:no  Daily problems with thinking and/or memory:no Ad8 score is=0   MMSE - Mini Mental State Exam 05/18/2018  Not completed: Refused        Immunizations Immunization History  Administered Date(s) Administered  . Fluad Quad(high Dose 65+) 08/26/2019  . Influenza Split 08/04/2012  . Influenza Whole 08/15/2008  . Influenza, High Dose Seasonal PF 08/05/2018, 08/26/2019  . Influenza,inj,Quad PF,6+ Mos 08/18/2016  . Influenza-Unspecified 08/17/2013, 10/10/2014, 08/18/2015, 08/17/2017  . Pneumococcal Conjugate-13 10/10/2014  . Pneumococcal Polysaccharide-23 09/29/2017  . Zoster 08/17/2013     Flu Vaccine status: Up to date Pneumococcal vaccine status: Up to date Covid-19 vaccine status: Completed vaccines pt states he will find his card and call back later w/ dates.   Qualifies for Shingles Vaccine?   Zostavax completed Yes     Screening Tests Health Maintenance  Topic Date Due  . COVID-19 Vaccine (1) Never done  . TETANUS/TDAP  Never done  . INFLUENZA VACCINE  06/17/2020  . PNA vac Low Risk Adult  Completed  . COLONOSCOPY  Discontinued    Health Maintenance  Health Maintenance Due  Topic Date Due  . COVID-19 Vaccine (1) Never done  . TETANUS/TDAP  Never done    Colorectal cancer screening: No longer  required.   Lung Cancer Screening: (Low Dose CT Chest recommended if Age 63-80 years, 30 pack-year currently smoking OR have quit w/in 15years.) does not qualify.    Additional Screening:  Hepatitis C Screening: does not qualify  Vision Screening: Recommended annual ophthalmology exams for early detection of glaucoma and  other disorders of the eye. Is the patient up to date with their annual eye exam?  Yes  Who is the provider or what is the name of the office in which the patient attends annual eye exams? Dr.Groat.   Dental Screening: Recommended annual dental exams for proper oral hygiene  Community Resource Referral / Chronic Care Management: CRR required this visit?  No   CCM required this visit?  No      Plan:    Please schedule your next medicare wellness visit with me in 1 yr.  Continue to eat heart healthy diet (full of fruits, vegetables, whole grains, lean protein, water--limit salt, fat, and sugar intake) and increase physical activity as tolerated.  Continue doing brain stimulating activities (puzzles, reading, adult coloring books, staying active) to keep memory sharp.     I have personally reviewed and noted the following in the patient's chart:   . Medical and social history . Use of alcohol, tobacco or illicit drugs  . Current medications and supplements . Functional ability and status . Nutritional status . Physical activity . Advanced directives . List of other physicians . Hospitalizations, surgeries, and ER visits in previous 12 months . Vitals . Screenings to include cognitive, depression, and falls . Referrals and appointments  In addition, I have reviewed and discussed with patient certain preventive protocols, quality metrics, and best practice recommendations. A written personalized care plan for preventive services as well as general preventive health recommendations were provided to patient.   Due to this being a telephonic visit, the after visit summary with patients personalized plan was offered to patient via mail or my-chart.Patient would like to access on my-chart   Shela Nevin, [place]   05/24/2020   Nurse Notes:

## 2020-05-24 ENCOUNTER — Ambulatory Visit (INDEPENDENT_AMBULATORY_CARE_PROVIDER_SITE_OTHER): Payer: Medicare Other | Admitting: *Deleted

## 2020-05-24 ENCOUNTER — Encounter: Payer: Self-pay | Admitting: *Deleted

## 2020-05-24 DIAGNOSIS — Z0000 Encounter for general adult medical examination without abnormal findings: Secondary | ICD-10-CM

## 2020-05-24 NOTE — Patient Instructions (Signed)
Please schedule your next medicare wellness visit with me in 1 yr.  Continue to eat heart healthy diet (full of fruits, vegetables, whole grains, lean protein, water--limit salt, fat, and sugar intake) and increase physical activity as tolerated.  Continue doing brain stimulating activities (puzzles, reading, adult coloring books, staying active) to keep memory sharp.    [name] [name] , Thank you for taking time to come for your Medicare Wellness Visit. I appreciate your ongoing commitment to your health goals. Please review the following plan we discussed and let me know if I can assist you in the future.   These are the goals we discussed: Goals    . Continue to eat a well balanced diet.    . Gain weight       This is a list of the screening recommended for you and due dates:  Health Maintenance  Topic Date Due  . COVID-19 Vaccine (1) Never done  . Tetanus Vaccine  Never done  . Flu Shot  06/17/2020  . Pneumonia vaccines  Completed  . Colon Cancer Screening  Discontinued    Preventive Care 95 Years and Older, Male Preventive care refers to lifestyle choices and visits with your health care provider that can promote health and wellness. This includes:  A yearly physical exam. This is also called an annual well check.  Regular dental and eye exams.  Immunizations.  Screening for certain conditions.  Healthy lifestyle choices, such as diet and exercise. What can I expect for my preventive care visit? Physical exam Your health care provider will check:  Height and weight. These may be used to calculate body mass index (BMI), which is a measurement that tells if you are at a healthy weight.  Heart rate and blood pressure.  Your skin for abnormal spots. Counseling Your health care provider may ask you questions about:  Alcohol, tobacco, and drug use.  Emotional well-being.  Home and relationship well-being.  Sexual activity.  Eating habits.  History of  falls.  Memory and ability to understand (cognition).  Work and work Statistician. What immunizations do I need?  Influenza (flu) vaccine  This is recommended every year. Tetanus, diphtheria, and pertussis (Tdap) vaccine  You may need a Td booster every 10 years. Varicella (chickenpox) vaccine  You may need this vaccine if you have not already been vaccinated. Zoster (shingles) vaccine  You may need this after age 63. Pneumococcal conjugate (PCV13) vaccine  One dose is recommended after age 4. Pneumococcal polysaccharide (PPSV23) vaccine  One dose is recommended after age 54. Measles, mumps, and rubella (MMR) vaccine  You may need at least one dose of MMR if you were born in 1957 or later. You may also need a second dose. Meningococcal conjugate (MenACWY) vaccine  You may need this if you have certain conditions. Hepatitis A vaccine  You may need this if you have certain conditions or if you travel or work in places where you may be exposed to hepatitis A. Hepatitis B vaccine  You may need this if you have certain conditions or if you travel or work in places where you may be exposed to hepatitis B. Haemophilus influenzae type b (Hib) vaccine  You may need this if you have certain conditions. You may receive vaccines as individual doses or as more than one vaccine together in one shot (combination vaccines). Talk with your health care provider about the risks and benefits of combination vaccines. What tests do I need? Blood tests  Lipid and  cholesterol levels. These may be checked every 5 years, or more frequently depending on your overall health.  Hepatitis C test.  Hepatitis B test. Screening  Lung cancer screening. You may have this screening every year starting at age 29 if you have a 30-pack-year history of smoking and currently smoke or have quit within the past 15 years.  Colorectal cancer screening. All adults should have this screening starting at age 27  and continuing until age 83. Your health care provider may recommend screening at age 39 if you are at increased risk. You will have tests every 1-10 years, depending on your results and the type of screening test.  Prostate cancer screening. Recommendations will vary depending on your family history and other risks.  Diabetes screening. This is done by checking your blood sugar (glucose) after you have not eaten for a while (fasting). You may have this done every 1-3 years.  Abdominal aortic aneurysm (AAA) screening. You may need this if you are a current or former smoker.  Sexually transmitted disease (STD) testing. Follow these instructions at home: Eating and drinking  Eat a diet that includes fresh fruits and vegetables, whole grains, lean protein, and low-fat dairy products. Limit your intake of foods with high amounts of sugar, saturated fats, and salt.  Take vitamin and mineral supplements as recommended by your health care provider.  Do not drink alcohol if your health care provider tells you not to drink.  If you drink alcohol: ? Limit how much you have to 0-2 drinks a day. ? Be aware of how much alcohol is in your drink. In the [place] one drink equals one 12 oz bottle of beer (355 mL), one 5 oz glass of wine (148 mL), or one 1 oz glass of hard liquor (44 mL). Lifestyle  Take daily care of your teeth and gums.  Stay active. Exercise for at least 30 minutes on 5 or more days each week.  Do not use any products that contain nicotine or tobacco, such as cigarettes, e-cigarettes, and chewing tobacco. If you need help quitting, ask your health care provider.  If you are sexually active, practice safe sex. Use a condom or other form of protection to prevent STIs (sexually transmitted infections).  Talk with your health care provider about taking a low-dose aspirin or statin. What's next?  Visit your health care provider once a year for a well check visit.  Ask your health care  provider how often you should have your eyes and teeth checked.  Stay up to date on all vaccines. This information is not intended to replace advice given to you by your health care provider. Make sure you discuss any questions you have with your health care provider. Document Revised: 10/28/2018 Document Reviewed: 10/28/2018 Elsevier Patient Education  2020 [employer].

## 2020-06-20 ENCOUNTER — Ambulatory Visit (INDEPENDENT_AMBULATORY_CARE_PROVIDER_SITE_OTHER): Payer: Medicare Other | Admitting: Pharmacist

## 2020-06-20 ENCOUNTER — Other Ambulatory Visit: Payer: Self-pay

## 2020-06-20 DIAGNOSIS — I4891 Unspecified atrial fibrillation: Secondary | ICD-10-CM

## 2020-06-20 DIAGNOSIS — I480 Paroxysmal atrial fibrillation: Secondary | ICD-10-CM | POA: Diagnosis not present

## 2020-06-20 DIAGNOSIS — Z5181 Encounter for therapeutic drug level monitoring: Secondary | ICD-10-CM | POA: Diagnosis not present

## 2020-06-20 LAB — POCT INR: INR: 2.6 (ref 2.0–3.0)

## 2020-06-20 NOTE — Patient Instructions (Addendum)
Description   Start taking Warfarin 1/2 tablet daily except 1 tablet on Mondays, Wednesdays, and Fridays.  Recheck INR in 5 weeks. Call [phone] if scheduled for any procedures or on any new medications

## 2020-07-19 ENCOUNTER — Ambulatory Visit (HOSPITAL_BASED_OUTPATIENT_CLINIC_OR_DEPARTMENT_OTHER)
Admission: RE | Admit: 2020-07-19 | Discharge: 2020-07-19 | Disposition: A | Discharge status: Home / Self Care | Payer: Medicare Other | Source: Ambulatory Visit | Attending: Internal Medicine | Admitting: Internal Medicine

## 2020-07-19 ENCOUNTER — Ambulatory Visit (HOSPITAL_COMMUNITY)
Admission: RE | Admit: 2020-07-19 | Discharge: 2020-07-19 | Disposition: A | Discharge status: Home / Self Care | Payer: Medicare Other | Source: Ambulatory Visit | Attending: Internal Medicine | Admitting: Internal Medicine

## 2020-07-19 ENCOUNTER — Other Ambulatory Visit: Payer: Self-pay

## 2020-07-19 VITALS — BP 105/65 | HR 75 | Wt 171.4 lb

## 2020-07-19 DIAGNOSIS — I071 Rheumatic tricuspid insufficiency: Secondary | ICD-10-CM

## 2020-07-19 DIAGNOSIS — I5032 Chronic diastolic (congestive) heart failure: Secondary | ICD-10-CM

## 2020-07-19 DIAGNOSIS — Z953 Presence of xenogenic heart valve: Secondary | ICD-10-CM | POA: Insufficient documentation

## 2020-07-19 DIAGNOSIS — I110 Hypertensive heart disease with heart failure: Secondary | ICD-10-CM | POA: Diagnosis present

## 2020-07-19 DIAGNOSIS — Z8546 Personal history of malignant neoplasm of prostate: Secondary | ICD-10-CM | POA: Insufficient documentation

## 2020-07-19 DIAGNOSIS — Z79899 Other long term (current) drug therapy: Secondary | ICD-10-CM | POA: Insufficient documentation

## 2020-07-19 DIAGNOSIS — Z7901 Long term (current) use of anticoagulants: Secondary | ICD-10-CM | POA: Insufficient documentation

## 2020-07-19 DIAGNOSIS — I5082 Biventricular heart failure: Secondary | ICD-10-CM | POA: Diagnosis not present

## 2020-07-19 DIAGNOSIS — I480 Paroxysmal atrial fibrillation: Secondary | ICD-10-CM

## 2020-07-19 DIAGNOSIS — Z85820 Personal history of malignant melanoma of skin: Secondary | ICD-10-CM | POA: Diagnosis not present

## 2020-07-19 DIAGNOSIS — M1990 Unspecified osteoarthritis, unspecified site: Secondary | ICD-10-CM | POA: Diagnosis not present

## 2020-07-19 DIAGNOSIS — F329 Major depressive disorder, single episode, unspecified: Secondary | ICD-10-CM | POA: Diagnosis not present

## 2020-07-19 DIAGNOSIS — I341 Nonrheumatic mitral (valve) prolapse: Secondary | ICD-10-CM | POA: Diagnosis not present

## 2020-07-19 DIAGNOSIS — Z8249 Family history of ischemic heart disease and other diseases of the circulatory system: Secondary | ICD-10-CM | POA: Insufficient documentation

## 2020-07-19 DIAGNOSIS — Z8261 Family history of arthritis: Secondary | ICD-10-CM | POA: Insufficient documentation

## 2020-07-19 DIAGNOSIS — E7800 Pure hypercholesterolemia, unspecified: Secondary | ICD-10-CM | POA: Insufficient documentation

## 2020-07-19 DIAGNOSIS — I50812 Chronic right heart failure: Secondary | ICD-10-CM

## 2020-07-19 DIAGNOSIS — E876 Hypokalemia: Secondary | ICD-10-CM

## 2020-07-19 DIAGNOSIS — I2729 Other secondary pulmonary hypertension: Secondary | ICD-10-CM | POA: Diagnosis not present

## 2020-07-19 DIAGNOSIS — Z9889 Other specified postprocedural states: Secondary | ICD-10-CM

## 2020-07-19 DIAGNOSIS — Z9079 Acquired absence of other genital organ(s): Secondary | ICD-10-CM | POA: Diagnosis not present

## 2020-07-19 LAB — COMPREHENSIVE METABOLIC PANEL
ALT: 16 U/L (ref 0–44)
AST: 19 U/L (ref 15–41)
Albumin: 4 g/dL (ref 3.5–5.0)
Alkaline Phosphatase: 41 U/L (ref 38–126)
Anion gap: 10 (ref 5–15)
BUN: 22 mg/dL (ref 8–23)
CO2: 27 mmol/L (ref 22–32)
Calcium: 9.9 mg/dL (ref 8.9–10.3)
Chloride: 104 mmol/L (ref 98–111)
Creatinine, Ser: 1.09 mg/dL (ref 0.61–1.24)
GFR calc Af Amer: >60 mL/min (ref >60)
GFR calc non Af Amer: >60 mL/min (ref >60)
Glucose, Bld: 112 mg/dL — ABNORMAL HIGH (ref 70–99)
Potassium: 4.5 mmol/L (ref 3.5–5.1)
Sodium: 141 mmol/L (ref 135–145)
Total Bilirubin: 1 mg/dL (ref 0.3–1.2)
Total Protein: 7.2 g/dL (ref 6.5–8.1)

## 2020-07-19 LAB — T4, FREE: Free T4: 1.25 ng/dL — ABNORMAL HIGH (ref 0.61–1.12)

## 2020-07-19 LAB — CBC
HCT: 45.8 % (ref 39.0–52.0)
Hemoglobin: 14.8 g/dL (ref 13.0–17.0)
MCH: 30.6 pg (ref 26.0–34.0)
MCHC: 32.3 g/dL (ref 30.0–36.0)
MCV: 94.6 fL (ref 80.0–100.0)
Platelets: 78 10*3/uL — ABNORMAL LOW (ref 150–400)
RBC: 4.84 MIL/uL (ref 4.22–5.81)
RDW: 12.2 % (ref 11.5–15.5)
WBC: 7.7 10*3/uL (ref 4.0–10.5)
nRBC: 0 % (ref 0.0–0.2)

## 2020-07-19 LAB — TSH: TSH: 1.072 u[IU]/mL (ref 0.350–4.500)

## 2020-07-19 LAB — ECHOCARDIOGRAM COMPLETE
Area-P 1/2: 3.19 cm2
S' Lateral: 2.8 cm

## 2020-07-19 LAB — BRAIN NATRIURETIC PEPTIDE: B Natriuretic Peptide: 223.7 pg/mL — ABNORMAL HIGH (ref 0.0–100.0)

## 2020-07-19 NOTE — Patient Instructions (Addendum)
Labs done today, your results will be available in MyChart, we will contact you for abnormal readings.  Please call our office in June 2022 to schedule your follow up appointment  If you have any questions or concerns before your next appointment please send us a message through mychart or call our office at [phone].    TO LEAVE A MESSAGE FOR THE NURSE SELECT OPTION 2, PLEASE LEAVE A MESSAGE INCLUDING: . YOUR NAME . DATE OF BIRTH . CALL BACK NUMBER . REASON FOR CALL**this is important as we prioritize the call backs  YOU WILL RECEIVE A CALL BACK THE SAME DAY AS LONG AS YOU CALL BEFORE 4:00 PM  At the [facility], you and your health needs are our priority. As part of our continuing mission to provide you with exceptional heart care, we have created designated Provider Care Teams. These Care Teams include your primary Cardiologist (physician) and Advanced Practice Providers (APPs- Physician Assistants and Nurse Practitioners) who all work together to provide you with the care you need, when you need it.   You may see any of the following providers on your designated Care Team at your next follow up: . Dr Daniel Bensimhon . Dr Dalton McLean . Amy Clegg, NP . Brittainy Simmons, PA . Lauren Kemp, PharmD   Please be sure to bring in all your medications bottles to every appointment.    

## 2020-07-19 NOTE — Progress Notes (Addendum)
Advanced [facility] Failure Clinic Note   Date:  07/19/2020   ID:  [name] [name], DOB 01/22/1931, MRN [id number]  Location: Home  Provider location: [facility] Type of Visit: Established patient  PCP:  [name], MD  Cardiologist:  Larae Grooms, MD Primary HF: [name] [name]  Chief Complaint: Heart Failure follow-up   History of Present Illness:  [name] [name] ([place]) is an 86 y.o. male with history of diastolic HF, PAF/Aflutter on coumadin, and h/o severe MV prolapse s/p MV repair 10/01/11.  He was admitted 04/26/17-04/30/17 with a fall, no syncope. Also with right sided heart failure. RHC showed normal cardiac output. No evidence of PAH. Diuresed with IV lasix, weight down about 10 pounds. Discharge weight was 190 pounds. He was discharged to SNF.   Admitted in 5/20 for RLL PNA. Also had some HF and diuresed 11 pounds. D/c weight was 162.   Here for routine f/u. Doing pretty well. Gets around with walker. Takes torsemide 20 daily and fluid is well controlled. Takes an extra torsemide about once a month. Occasional palpitations. No syncope or presyncope.   Echo today 9/2/21L EF 60-65% RV markedly dilated but normal function. Only mild TR visually. MV repair ok. IVC small RVSP ~35 Personally reviewed  Echo 5/20: EF 55-60% RV dilated. Normal function. RVSP 35 moderate to severe TR.   ECHO 09/04/2017  LVEF 55-60%  Severe TR, RV moderately to severely dilated. Flattened septum Echo 12/19/16 with LVEF 50-55%, Mild AI, s/p MVR with mild residual MR, Mod LAE, Mod RV dilation, severe RAE, Severe TR.  RHC 6/18 RA = 8 RV = 36/7 PA = 33/4 (15) PCW = 6 Fick cardiac output/index = 7.7/3.6 PVR = 1.2 WU Ao sat = 99% PA sat = 69%, 72% SVC sat = 69%      Past Medical History:  Diagnosis Date  . Abnormality of gait 05/27/2016  . Adenomatous polyps   . Carpal tunnel syndrome 06/25/2016   Right  . Depressive disorder, not elsewhere classified     . First degree AV block    Holter 3/18: NSR, PACs, PVCs, no AFib, no pauses.  Marland Kitchen Hereditary and idiopathic peripheral neuropathy 06/25/2016  . Hypertension   . Internal nasal lesion 05/15/2013  . Melanoma ([place])    Left Shoulder  . Mitral regurgitation   . MVP (mitral valve prolapse)    a. With severe MR s/p Complex valvuloplasty including artificial Gore-tex neochord placement x4, chordal transposition x1, chordal release x1, # 32 mm Sorin Memo 3D Ring Annuloplasty 2012. // b. Echo 2/18: mild LVH, EF 50-55, mild AI, MV repair with mild MR, mod LAE, mod RVE, severe RAE, severe TR  . Neuropathy   . Normal coronary arteries    a. Normal coronary anatomy by cath 2012.  . Osteoarthritis    Knees  . PAF (paroxysmal atrial fibrillation) ([place])    a. Post-op MVR 2012.  Marland Kitchen Personal history of colonic polyps   . Prostate cancer ([place])   . Pulmonary HTN ([place])    a. Mild-mod by cath 2012.  . Pure hypercholesterolemia   . PVC (premature ventricular contraction)   . Thrombocytopenia ([place])   . Vision abnormalities    Cornea scarring   Past Surgical History:  Procedure Laterality Date  . CARDIAC CATHETERIZATION  09/2011   Pre-op for MVR -- normal coronaries.  Marland Kitchen CARDIOVERSION N/A 01/02/2016   Procedure: CARDIOVERSION;  Surgeon: Thayer Headings, MD;  Location: [facility]  ENDOSCOPY;  Service: Cardiovascular;  Laterality: N/A;  . COLONOSCOPY W/ POLYPECTOMY    . INGUINAL HERNIA REPAIR  09/2009   Left  . KNEE ARTHROSCOPY      left x3  and right x2  . Melanoma Surgery     2001, 2005, 2006, 2009  . MITRAL VALVE REPAIR  10/01/2011   complex valvuloplasty with Goretex cord replacement and chordal transposition 8mm Sorin Memo 3D ring annuloplasty  . Nuclear Stress Test  09/2006   EF-64%, Normal  . PROSTATECTOMY  1993  . RIGHT HEART CATH N/A 04/29/2017   Procedure: Right Heart Cath;  Surgeon: Jolaine Artist, MD;  Location: [place] CV LAB;  Service: Cardiovascular;  Laterality: N/A;  . ROOT CANAL   08-19-12  . ROTATOR CUFF REPAIR  2003   left  . TEE WITHOUT CARDIOVERSION  09/26/2011   Procedure: TRANSESOPHAGEAL ECHOCARDIOGRAM (TEE);  Surgeon: Lelon Perla, MD;  Location: [facility] ENDOSCOPY;  Service: Cardiovascular;  Laterality: N/A;  . US ECHOCARDIOGRAPHY  09/2009, 08/1011   mild LVH,mild AI,MVP with mild MR, mild-mod. TR with mild Pulm. HTN, EF-55-60%     Current Outpatient Medications  Medication Sig Dispense Refill  . acetaminophen (TYLENOL) 500 MG tablet Take 1,000 mg by mouth every 6 (six) hours as needed for mild pain.    Marland Kitchen amiodarone (PACERONE) 200 MG tablet Take 100 mg by mouth daily.    Marland Kitchen b complex vitamins tablet Take 1 tablet by mouth daily.    . cycloSPORINE (RESTASIS) 0.05 % ophthalmic emulsion Place 1 drop into both eyes 2 (two) times daily.    . Ferrous Fumarate (FERROCITE) 324 (106 Fe) MG TABS tablet Take 1 tablet (106 mg of iron total) by mouth daily. 90 tablet 1  . fluticasone (FLONASE) 50 MCG/ACT nasal spray Place 2 sprays into both nostrils daily.   11  . gabapentin (NEURONTIN) 300 MG capsule Take 300 mg by mouth 3 (three) times daily.    Marland Kitchen KLOR-CON M20 20 MEQ tablet TAKE 2 TABLETS DAILY 180 tablet 3  . Multiple Vitamin (MULTIVITAMIN WITH MINERALS) TABS tablet Take 1 tablet by mouth every evening.     . rosuvastatin (CRESTOR) 5 MG tablet TAKE 1 TABLET BY MOUTH  DAILY 90 tablet 3  . torsemide (DEMADEX) 20 MG tablet TAKE 2 TABLETS BY MOUTH  DAILY 180 tablet 3  . traZODone (DESYREL) 100 MG tablet Take 200 mg by mouth at bedtime.    Marland Kitchen warfarin (COUMADIN) 5 MG tablet TAKE 1 TABLET DAILY AS DIRECTED BY THE COUMADIN CLINIC 90 tablet 3   No current facility-administered medications for this encounter.    Allergies:   Patient has no known allergies.   Social History:  The patient  reports that he has never smoked. He has never used smokeless tobacco. He reports that he does not drink alcohol and does not use drugs.   Family History:  The patient's family history  includes Arthritis in his mother; Clotting disorder in his brother; Hypertension in his father and mother; Psychosis in his father; Stroke in his mother.   ROS:  Please see the history of present illness.   All other systems are personally reviewed and negative.   Vitals:   07/19/20 1143  BP: 105/65  Pulse: 75  SpO2: 94%  Weight: 77.7 kg (171 lb 6.4 oz)    Exam:   General:  Elderly Thin appearing. No resp difficulty HEENT: normal Neck: supple. no JVD. Carotids 2+ bilat; no bruits. No lymphadenopathy or thryomegaly appreciated.  Cor: PMI nondisplaced. Regular rate & rhythm. No rubs, gallops or murmurs. Lungs: clear Abdomen: soft, nontender, nondistended. No hepatosplenomegaly. No bruits or masses. Good bowel sounds. Extremities: no cyanosis, clubbing, rash, edema Neuro: alert & orientedx3, cranial nerves grossly intact. moves all 4 extremities w/o difficulty. Affect pleasant   Recent Labs: 03/08/2020: TSH 0.87 05/16/2020: ALT 11; BUN 24; Creatinine 1.21; Hemoglobin 14.6; Platelet Count 78; Potassium 5.1; Sodium 142  Personally reviewed   Wt Readings from Last 3 Encounters:  07/19/20 77.7 kg (171 lb 6.4 oz)  01/04/20 79.7 kg (175 lb 9.6 oz)  12/07/19 78 kg (172 lb)   ECG: NSR 75 1AVB 235ms iRBBB Personally reviewed  ASSESSMENT AND PLAN:  1. Combined diastolic HF with R>L symptoms in the setting of longstanding MV disease.  - Stable NYHA II. Volume status looks good - Echo today 07/19/20 with stable MV repair. RV is dilated but normal function TR only mild this echo. Overall stable. We discussed possibility of TV clip if needed in future but currently no indication  - Check labs today  2. PAF - Remains in NSR on amio 100 mg daily - He did not tolerate AF well. Will continue low-dose amio - Continue warfarin for anticoagulation. No bleeding. Will not switch to DOAC with h/o MV repair  3. Severe TR - Stable. See discussion above  4. [place] - RHC 6/18 without severe PAH.  (RA  8, PA 33/4 (15), CI 3.6)  5. Severe MVP S/P Bioprosthetic MVR  - Stable. - We discussed continue need for SBE prophylaxis before any procedure> He is aware   6. Marked 1AVB - Toprol and amio cut back.  - Follow closely for possible PPM need.   7. Recurrent prostate CA - PSA trending up Bone scan negative. - Urology contemplating Lupron vs XRT. Either is ok from our standpoint.  - Following with Urology   Signed, [name], MD  07/19/2020 11:57 AM  Advanced [facility] Failure [address] [place] [id number] [phone] (office) [phone] (fax)

## 2020-07-19 NOTE — Progress Notes (Signed)
  Echocardiogram 2D Echocardiogram has been performed.  [name] [name] 07/19/2020, 10:48 AM

## 2020-07-19 NOTE — Addendum Note (Signed)
Encounter addended by: Jolaine Artist, MD on: 07/19/2020 1:42 PM  Actions taken: Clinical Note Signed

## 2020-07-20 LAB — T3, FREE: T3, Free: 2.4 pg/mL (ref 2.0–4.4)

## 2020-07-25 ENCOUNTER — Ambulatory Visit (INDEPENDENT_AMBULATORY_CARE_PROVIDER_SITE_OTHER): Payer: Medicare Other | Admitting: *Deleted

## 2020-07-25 ENCOUNTER — Other Ambulatory Visit: Payer: Self-pay

## 2020-07-25 DIAGNOSIS — Z5181 Encounter for therapeutic drug level monitoring: Secondary | ICD-10-CM

## 2020-07-25 DIAGNOSIS — I4891 Unspecified atrial fibrillation: Secondary | ICD-10-CM | POA: Diagnosis not present

## 2020-07-25 LAB — POCT INR: INR: 2.7 (ref 2.0–3.0)

## 2020-07-25 NOTE — Patient Instructions (Signed)
Description   Continue taking 1 tablet daily except 1/2 tablet on Mondays, Wednesdays, and Fridays.  Recheck INR in 5 weeks. Call [phone] if scheduled for any procedures or on any new medications

## 2020-07-29 MED ORDER — TRIAMCINOLONE ACETONIDE 40 MG/ML IJ SUSP
40.0000 mg | INTRAMUSCULAR | Status: AC | PRN
  Administered 2020-03-14: 40 mg via INTRA_ARTICULAR

## 2020-07-29 MED ORDER — BUPIVACAINE HCL 0.25 % IJ SOLN
4.0000 mL | INTRAMUSCULAR | Status: AC | PRN
  Administered 2020-03-14: 4 mL via INTRA_ARTICULAR

## 2020-08-07 ENCOUNTER — Other Ambulatory Visit (HOSPITAL_COMMUNITY): Payer: Self-pay | Admitting: Urology

## 2020-08-07 DIAGNOSIS — R9721 Rising PSA following treatment for malignant neoplasm of prostate: Secondary | ICD-10-CM

## 2020-08-08 ENCOUNTER — Telehealth: Payer: Self-pay | Admitting: *Deleted

## 2020-08-08 NOTE — Telephone Encounter (Signed)
Pt called to changed appt to one week sooner and reported his prostate cancer is back and has to have an injection of Eligard for treatment. Expressed sympathy for diagnosis and that the injection is fine but call back if they prescribe any other med.Also, changed appt for pt.

## 2020-08-14 ENCOUNTER — Other Ambulatory Visit: Payer: Self-pay

## 2020-08-14 ENCOUNTER — Ambulatory Visit (INDEPENDENT_AMBULATORY_CARE_PROVIDER_SITE_OTHER): Payer: Medicare Other | Admitting: Family Medicine

## 2020-08-14 VITALS — BP 118/67 | HR 71 | Temp 97.7°F | Resp 13 | Ht 75.0 in | Wt 171.0 lb

## 2020-08-14 DIAGNOSIS — R7989 Other specified abnormal findings of blood chemistry: Secondary | ICD-10-CM | POA: Diagnosis not present

## 2020-08-14 DIAGNOSIS — I10 Essential (primary) hypertension: Secondary | ICD-10-CM | POA: Diagnosis not present

## 2020-08-14 DIAGNOSIS — D649 Anemia, unspecified: Secondary | ICD-10-CM

## 2020-08-14 DIAGNOSIS — D696 Thrombocytopenia, unspecified: Secondary | ICD-10-CM | POA: Diagnosis not present

## 2020-08-14 DIAGNOSIS — Z23 Encounter for immunization: Secondary | ICD-10-CM

## 2020-08-14 DIAGNOSIS — N189 Chronic kidney disease, unspecified: Secondary | ICD-10-CM

## 2020-08-14 DIAGNOSIS — C61 Malignant neoplasm of prostate: Secondary | ICD-10-CM

## 2020-08-14 DIAGNOSIS — R55 Syncope and collapse: Secondary | ICD-10-CM

## 2020-08-14 DIAGNOSIS — I5032 Chronic diastolic (congestive) heart failure: Secondary | ICD-10-CM

## 2020-08-14 DIAGNOSIS — K5901 Slow transit constipation: Secondary | ICD-10-CM

## 2020-08-14 DIAGNOSIS — R739 Hyperglycemia, unspecified: Secondary | ICD-10-CM

## 2020-08-14 DIAGNOSIS — E7800 Pure hypercholesterolemia, unspecified: Secondary | ICD-10-CM | POA: Diagnosis not present

## 2020-08-14 NOTE — Assessment & Plan Note (Signed)
Hydrate and monitor 

## 2020-08-14 NOTE — Assessment & Plan Note (Signed)
Is taking a vitamin B complex daily. Will check level and adjust accordingly

## 2020-08-14 NOTE — Assessment & Plan Note (Signed)
Asymptomatic, repeat CBC today

## 2020-08-14 NOTE — Assessment & Plan Note (Signed)
Doing well current regimen, no changes

## 2020-08-14 NOTE — Assessment & Plan Note (Signed)
He underwent prostatectomy 29 years ago and then recently they have seen his PSA rise again due to recurrent Cancer. His urologist, Dr Gilford Rile, has him set up for advanced imaging including NM PET scan tomorrow and he is now on Eligard injections twice a year. He had his first one last week and tolerated it well with no side effects noted so far

## 2020-08-14 NOTE — Patient Instructions (Signed)
Thrombocytopenia Thrombocytopenia is a condition in which you have a low number of platelets in your blood. Platelets are also called thrombocytes. Platelets are tiny cells in the blood. When you bleed, they clump together at the cut or injury to stop the bleeding. This is called blood clotting. Not having enough platelets can cause bleeding problems. Some cases of thrombocytopenia are mild while others are more severe. What are the causes? This condition may be caused by:  Decreased production of platelets. This may be caused by: ? Aplastic anemia. This is when your bone marrow stops making blood cells. ? Cancer in the bone marrow. ? Certain medicines, including chemotherapy. ? Infection in the bone marrow. ? Drinking a lot of alcohol.  Increased destruction of platelets. This may be caused by: ? Certain immune diseases. ? Certain medicines. ? Certain blood clotting disorders. ? Certain inherited disorders. ? Certain bleeding disorders. ? Pregnancy. ? Having an enlarged spleen (hypersplenism). In hypersplenism, the spleen gathers up platelets from circulation. This means that the platelets are not available to help with blood clotting. The spleen can be enlarged because of cirrhosis or other conditions. What are the signs or symptoms? Symptoms of this condition are the result of poor blood clotting. They will vary depending on how low the platelet counts are. Symptoms may include:  Abnormal bleeding.  Nosebleeds.  Heavy menstrual periods.  Blood in the urine or stool (feces).  A purplish discoloration in the skin (purpura).  Bruising.  A rash that looks like pinpoint, purplish-red spots (petechiae) on the skin and mucous membranes. How is this diagnosed?  This condition may be diagnosed with blood tests and a physical exam. Sometimes, a sample of bone marrow may be removed to look for the original cells (megakaryocytes) that make platelets. Other tests may be needed depending  on the cause. How is this treated? Treatment for this condition depends on the cause. Treatment options may include:  Treatment of another condition that is causing the low platelet count.  Medicines to help protect your platelets from being destroyed.  A replacement (transfusion) of platelets to stop or prevent bleeding.  Surgery to remove the spleen. Follow these instructions at home: Activity  Avoid activities that could cause injury or bruising, and follow instructions about how to prevent falls.  Take extra care not to cut yourself when you shave or when you use scissors, needles, knives, and other tools.  Take extra care to protect yourself from burns when ironing or cooking. General instructions   Check your skin and the inside of your mouth for bruising or bleeding as told by your health care provider.  Check your spit (sputum), urine, and stool for blood as told by your health care provider.  Do not drink alcohol.  Take over-the-counter and prescription medicines only as told by your health care provider.  Do not take any medicines that have aspirin or NSAIDs in them. These medicines can thin your blood and cause you to bleed more easily.  Tell all your health care providers, including dentists and eye doctors, about your condition. Contact a health care provider if you have:  Unexplained bruising. Get help right away if you have:  Active bleeding from anywhere on your body.  Blood in your sputum, urine, or stool. Summary  Thrombocytopenia is a condition in which you have a low number of platelets in your blood.  Platelets are needed for blood clotting.  Symptoms of this condition are the result of poor blood clotting and  may include abnormal bleeding, nosebleeds, and bruising.  This condition may be diagnosed with blood tests and a physical exam.  Treatment for this condition depends on the cause. This information is not intended to replace advice given  to you by your health care provider. Make sure you discuss any questions you have with your health care provider. Document Revised: 08/05/2018 Document Reviewed: 08/05/2018 Elsevier Patient Education  2020 [employer]  

## 2020-08-14 NOTE — Assessment & Plan Note (Signed)
Stable and following closely with cardiology.

## 2020-08-14 NOTE — Progress Notes (Signed)
Subjective:    Patient ID: [name] [name], male    DOB: Mar 31, 1931, 86 y.o.   MRN: [id number]  Chief Complaint  Patient presents with  . [place]    HPI Patient is in today for follow up on chronic medical concerns. No recent febrile illness or hospitalizations. No polyuria or polydipsia. He is following closely with urology due to rising PSA and recurrence of prostate cancer. He has had his first Eligard injection and tolerated it well. He has a NM PET scan tomorrow for staging. Notes any episode of syncope about 3 weeks ago. Had one episode of syncope about 3 weeks ago when he was putting eye drops in and his head was back. He felt woozey and light headed then fell off his chair and injured his right arm and right flank. He notes the bruising in arm and pain in flank are much better. He has not had another fall but he does note he continues to feel a little light headed when he puts drops in his right eye but it is milder if he does it quickly. No headaches, visual or hearing changes. Denies CP/palp/SOB/HA/congestion/fevers/GI or GU c/o. Taking meds as prescribed  Past Medical History:  Diagnosis Date  . Abnormality of gait 05/27/2016  . Adenomatous polyps   . Carpal tunnel syndrome 06/25/2016   Right  . Depressive disorder, not elsewhere classified   . First degree AV block    Holter 3/18: NSR, PACs, PVCs, no AFib, no pauses.  Marland Kitchen Hereditary and idiopathic peripheral neuropathy 06/25/2016  . Hypertension   . Internal nasal lesion 05/15/2013  . Melanoma ([place])    Left Shoulder  . Mitral regurgitation   . MVP (mitral valve prolapse)    a. With severe MR s/p Complex valvuloplasty including artificial Gore-tex neochord placement x4, chordal transposition x1, chordal release x1, # 32 mm Sorin Memo 3D Ring Annuloplasty 2012. // b. Echo 2/18: mild LVH, EF 50-55, mild AI, MV repair with mild MR, mod LAE, mod RVE, severe RAE, severe TR  . Neuropathy   . Normal coronary arteries    a.  Normal coronary anatomy by cath 2012.  . Osteoarthritis    Knees  . PAF (paroxysmal atrial fibrillation) ([place])    a. Post-op MVR 2012.  Marland Kitchen Personal history of colonic polyps   . Prostate cancer ([place])   . Pulmonary HTN ([place])    a. Mild-mod by cath 2012.  . Pure hypercholesterolemia   . PVC (premature ventricular contraction)   . Thrombocytopenia ([place])   . Vision abnormalities    Cornea scarring    Past Surgical History:  Procedure Laterality Date  . CARDIAC CATHETERIZATION  09/2011   Pre-op for MVR -- normal coronaries.  Marland Kitchen CARDIOVERSION N/A 01/02/2016   Procedure: CARDIOVERSION;  Surgeon: Thayer Headings, MD;  Location: [facility] ENDOSCOPY;  Service: Cardiovascular;  Laterality: N/A;  . COLONOSCOPY W/ POLYPECTOMY    . INGUINAL HERNIA REPAIR  09/2009   Left  . KNEE ARTHROSCOPY      left x3  and right x2  . Melanoma Surgery     2001, 2005, 2006, 2009  . MITRAL VALVE REPAIR  10/01/2011   complex valvuloplasty with Goretex cord replacement and chordal transposition 82mm Sorin Memo 3D ring annuloplasty  . Nuclear Stress Test  09/2006   EF-64%, Normal  . PROSTATECTOMY  1993  . RIGHT HEART CATH N/A 04/29/2017   Procedure: Right Heart Cath;  Surgeon: Jolaine Artist, MD;  Location: [facility]  INVASIVE CV LAB;  Service: Cardiovascular;  Laterality: N/A;  . ROOT CANAL  08-19-12  . ROTATOR CUFF REPAIR  2003   left  . TEE WITHOUT CARDIOVERSION  09/26/2011   Procedure: TRANSESOPHAGEAL ECHOCARDIOGRAM (TEE);  Surgeon: Lelon Perla, MD;  Location: [facility] ENDOSCOPY;  Service: Cardiovascular;  Laterality: N/A;  . US ECHOCARDIOGRAPHY  09/2009, 08/1011   mild LVH,mild AI,MVP with mild MR, mild-mod. TR with mild Pulm. HTN, EF-55-60%    Family History  Problem Relation Age of Onset  . Clotting disorder Brother        CVA's  . Arthritis Mother   . Hypertension Mother   . Stroke Mother   . Hypertension Father   . Psychosis Father        psychiatric care  . Colon cancer Neg Hx   . Stomach cancer Neg  Hx   . Heart attack Neg Hx   . Prostate cancer Neg Hx   . Pancreatic cancer Neg Hx     Social History   Socioeconomic History  . Marital status: Widowed    Spouse name: Not on file  . Number of children: 2  . Years of education: 94  . Highest education level: Not on file  Occupational History    Comment: retired  Tobacco Use  . Smoking status: Never Smoker  . Smokeless tobacco: Never Used  Vaping Use  . Vaping Use: Never used  Substance and Sexual Activity  . Alcohol use: No    Alcohol/week: 0.0 standard drinks    Comment: Last drink in 2000  . Drug use: No  . Sexual activity: Not Currently  Other Topics Concern  . Not on file  Social History Narrative   Retired - Optometrist   Widower   2 children (one in [place] and on one in [place])    Drinks 1 cup of coffee per day   Social Determinants of Radio broadcast assistant Strain: Low Risk   . Difficulty of Paying Living Expenses: Not hard at all  Food Insecurity: No Food Insecurity  . Worried About Charity fundraiser in the Last Year: Never true  . Ran Out of Food in the Last Year: Never true  Transportation Needs: No Transportation Needs  . Lack of Transportation (Medical): No  . Lack of Transportation (Non-Medical): No  Physical Activity:   . Days of Exercise per Week: Not on file  . Minutes of Exercise per Session: Not on file  Stress:   . Feeling of Stress : Not on file  Social Connections:   . Frequency of Communication with Friends and Family: Not on file  . Frequency of Social Gatherings with Friends and Family: Not on file  . Attends Religious Services: Not on file  . Active Member of Clubs or Organizations: Not on file  . Attends Archivist Meetings: Not on file  . Marital Status: Not on file  Intimate Partner Violence:   . Fear of Current or Ex-Partner: Not on file  . Emotionally Abused: Not on file  . Physically Abused: Not on file  . Sexually Abused: Not on file    Outpatient  Medications Prior to Visit  Medication Sig Dispense Refill  . acetaminophen (TYLENOL) 500 MG tablet Take 1,000 mg by mouth every 6 (six) hours as needed for mild pain.    Marland Kitchen amiodarone (PACERONE) 200 MG tablet Take 100 mg by mouth daily.    Marland Kitchen b complex vitamins tablet Take 1 tablet by mouth  daily.    . cycloSPORINE (RESTASIS) 0.05 % ophthalmic emulsion Place 1 drop into both eyes 2 (two) times daily.    . fluticasone (FLONASE) 50 MCG/ACT nasal spray Place 2 sprays into both nostrils daily.   11  . gabapentin (NEURONTIN) 300 MG capsule Take 300 mg by mouth 3 (three) times daily.    Marland Kitchen KLOR-CON M20 20 MEQ tablet TAKE 2 TABLETS DAILY 180 tablet 3  . Multiple Vitamin (MULTIVITAMIN WITH MINERALS) TABS tablet Take 1 tablet by mouth every evening.     . rosuvastatin (CRESTOR) 5 MG tablet TAKE 1 TABLET BY MOUTH  DAILY 90 tablet 3  . torsemide (DEMADEX) 20 MG tablet TAKE 2 TABLETS BY MOUTH  DAILY 180 tablet 3  . traZODone (DESYREL) 100 MG tablet Take 200 mg by mouth at bedtime.    Marland Kitchen warfarin (COUMADIN) 5 MG tablet TAKE 1 TABLET DAILY AS DIRECTED BY THE COUMADIN CLINIC 90 tablet 3  . Ferrous Fumarate (FERROCITE) 324 (106 Fe) MG TABS tablet Take 1 tablet (106 mg of iron total) by mouth daily. 90 tablet 1   No facility-administered medications prior to visit.    No Known Allergies  Review of Systems  Constitutional: Positive for malaise/fatigue. Negative for fever.  HENT: Negative for congestion and hearing loss.   Eyes: Negative for blurred vision.  Respiratory: Negative for shortness of breath.   Cardiovascular: Negative for chest pain, palpitations and leg swelling.  Gastrointestinal: Negative for abdominal pain, blood in stool and nausea.  Genitourinary: Negative for dysuria and frequency.  Musculoskeletal: Negative for falls.  Skin: Negative for rash.  Neurological: Positive for dizziness. Negative for loss of consciousness and headaches.  Endo/Heme/Allergies: Negative for environmental  allergies.  Psychiatric/Behavioral: Negative for depression. The patient is not nervous/anxious.        Objective:    Physical Exam Vitals and nursing note reviewed.  Constitutional:      General: He is not in acute distress.    Appearance: He is well-developed.  HENT:     Head: Normocephalic and atraumatic.     Nose: Nose normal.  Eyes:     General:        Right eye: No discharge.        Left eye: No discharge.  Cardiovascular:     Rate and Rhythm: Normal rate and regular rhythm.     Heart sounds: Murmur heard.   Pulmonary:     Effort: Pulmonary effort is normal.     Breath sounds: Normal breath sounds.  Abdominal:     General: Bowel sounds are normal.     Palpations: Abdomen is soft.     Tenderness: There is no abdominal tenderness.  Musculoskeletal:     Cervical back: Normal range of motion and neck supple.  Skin:    General: Skin is warm and dry.     Findings: Bruising present.     Comments: Right arm large, old bruise on arm. Dark, purple, yellow around edges.   Neurological:     Mental Status: He is alert and oriented to person, place, and time.     BP 118/67 (BP Location: Right Arm, Patient Position: Sitting, Cuff Size: Large)   Pulse 71   Temp 97.7 F (36.5 C) (Oral)   Resp 13   Ht 6\' 3"  (1.905 m)   Wt 171 lb (77.6 kg)   SpO2 94%   BMI 21.37 kg/m  Wt Readings from Last 3 Encounters:  08/14/20 171 lb (77.6 kg)  07/19/20 171  lb 6.4 oz (77.7 kg)  01/04/20 175 lb 9.6 oz (79.7 kg)    Diabetic Foot Exam - Simple   No data filed     Lab Results  Component Value Date   WBC 7.7 07/19/2020   HGB 14.8 07/19/2020   HCT 45.8 07/19/2020   PLT 78 (L) 07/19/2020   GLUCOSE 112 (H) 07/19/2020   CHOL 141 03/08/2020   TRIG 109.0 03/08/2020   HDL 50.00 03/08/2020   LDLCALC 69 03/08/2020   ALT 16 07/19/2020   AST 19 07/19/2020   NA 141 07/19/2020   K 4.5 07/19/2020   CL 104 07/19/2020   CREATININE 1.09 07/19/2020   BUN 22 07/19/2020   CO2 27 07/19/2020    TSH 1.072 07/19/2020   PSA 2.67 03/20/2020   INR 2.7 07/25/2020   HGBA1C 6.0 (H) 04/03/2014    Lab Results  Component Value Date   TSH 1.072 07/19/2020   Lab Results  Component Value Date   WBC 7.7 07/19/2020   HGB 14.8 07/19/2020   HCT 45.8 07/19/2020   MCV 94.6 07/19/2020   PLT 78 (L) 07/19/2020   Lab Results  Component Value Date   NA 141 07/19/2020   K 4.5 07/19/2020   CO2 27 07/19/2020   GLUCOSE 112 (H) 07/19/2020   BUN 22 07/19/2020   CREATININE 1.09 07/19/2020   BILITOT 1.0 07/19/2020   ALKPHOS 41 07/19/2020   AST 19 07/19/2020   ALT 16 07/19/2020   PROT 7.2 07/19/2020   ALBUMIN 4.0 07/19/2020   CALCIUM 9.9 07/19/2020   ANIONGAP 10 07/19/2020   GFR 78.82 03/08/2020   Lab Results  Component Value Date   CHOL 141 03/08/2020   Lab Results  Component Value Date   HDL 50.00 03/08/2020   Lab Results  Component Value Date   LDLCALC 69 03/08/2020   Lab Results  Component Value Date   TRIG 109.0 03/08/2020   Lab Results  Component Value Date   CHOLHDL 3 03/08/2020   Lab Results  Component Value Date   HGBA1C 6.0 (H) 04/03/2014       Assessment & Plan:   Problem List Items Addressed This Visit    Chronic diastolic heart failure ([place]) (Chronic)    Stable and following closely with cardiology.       ADENOCARCINOMA, PROSTATE    He underwent prostatectomy 29 years ago and then recently they have seen his PSA rise again due to recurrent Cancer. His urologist, Dr Gilford Rile, has him set up for advanced imaging including NM PET scan tomorrow and he is now on Eligard injections twice a year. He had his first one last week and tolerated it well with no side effects noted so far      HYPERCHOLESTEROLEMIA - Primary   Thrombocytopenia ([place])    Asymptomatic, repeat CBC today      Relevant Orders   CBC   HYPERTENSION, BENIGN ESSENTIAL    Well controlled, no changes to meds. Encouraged heart healthy diet such as the DASH diet and exercise as tolerated.         Relevant Orders   Comprehensive metabolic panel   Syncope    Had one episode of syncope about 3 weeks ago when he was putting eye drops in and his head was back. He felt woozey and light headed then fell off his chair and injured his right arm and right flank. He notes the bruising in arm and pain in flank are much better. He has not had  another fall but he does note he continues to feel a little light headed when he puts drops in his right eye but it is milder if he does it quickly. If it worsens and he is ready to work this up he will let us know.       Constipation    Doing well current regimen, no changes      Chronic renal insufficiency    Hydrate and monitor      Anemia   Relevant Orders   Iron, TIBC and Ferritin Panel   Hyperglycemia   Relevant Orders   Hemoglobin A1c   High serum vitamin B12    Is taking a vitamin B complex daily. Will check level and adjust accordingly      Relevant Orders   Vitamin B12      I have discontinued Meta Hatchet. Scales's Ferrous Fumarate. I am also having him maintain his multivitamin with minerals, cycloSPORINE, fluticasone, b complex vitamins, acetaminophen, Klor-Con M20, warfarin, amiodarone, gabapentin, traZODone, rosuvastatin, and torsemide.  No orders of the defined types were placed in this encounter.    Penni Homans, MD

## 2020-08-14 NOTE — Addendum Note (Signed)
Addended by: Alberteen Spindle R on: 08/14/2020 12:54 PM   Modules accepted: Orders

## 2020-08-14 NOTE — Assessment & Plan Note (Signed)
Had one episode of syncope about 3 weeks ago when he was putting eye drops in and his head was back. He felt woozey and light headed then fell off his chair and injured his right arm and right flank. He notes the bruising in arm and pain in flank are much better. He has not had another fall but he does note he continues to feel a little light headed when he puts drops in his right eye but it is milder if he does it quickly. If it worsens and he is ready to work this up he will let us know.

## 2020-08-14 NOTE — Assessment & Plan Note (Signed)
Well controlled, no changes to meds. Encouraged heart healthy diet such as the DASH diet and exercise as tolerated.  °

## 2020-08-15 ENCOUNTER — Encounter (HOSPITAL_COMMUNITY)
Admission: RE | Admit: 2020-08-15 | Discharge: 2020-08-15 | Disposition: A | Discharge status: Home / Self Care | Payer: Medicare Other | Source: Ambulatory Visit | Attending: Urology | Admitting: Urology

## 2020-08-15 DIAGNOSIS — R9721 Rising PSA following treatment for malignant neoplasm of prostate: Secondary | ICD-10-CM | POA: Insufficient documentation

## 2020-08-15 LAB — COMPREHENSIVE METABOLIC PANEL
AG Ratio: 1.6 (calc) (ref 1.0–2.5)
ALT: 11 U/L (ref 9–46)
AST: 15 U/L (ref 10–35)
Albumin: 4.1 g/dL (ref 3.6–5.1)
Alkaline phosphatase (APISO): 47 U/L (ref 35–144)
BUN: 20 mg/dL (ref 7–25)
CO2: 28 mmol/L (ref 20–32)
Calcium: 9.8 mg/dL (ref 8.6–10.3)
Chloride: 104 mmol/L (ref 98–110)
Creat: 0.92 mg/dL (ref 0.70–1.11)
Globulin: 2.6 g/dL (calc) (ref 1.9–3.7)
Glucose, Bld: 61 mg/dL — ABNORMAL LOW (ref 65–99)
Potassium: 4.6 mmol/L (ref 3.5–5.3)
Sodium: 142 mmol/L (ref 135–146)
Total Bilirubin: 0.8 mg/dL (ref 0.2–1.2)
Total Protein: 6.7 g/dL (ref 6.1–8.1)

## 2020-08-15 LAB — CBC
HCT: 43.2 % (ref 38.5–50.0)
Hemoglobin: 14.6 g/dL (ref 13.2–17.1)
MCH: 31.1 pg (ref 27.0–33.0)
MCHC: 33.8 g/dL (ref 32.0–36.0)
MCV: 92.1 fL (ref 80.0–100.0)
MPV: 11.8 fL (ref 7.5–12.5)
Platelets: 66 10*3/uL — ABNORMAL LOW (ref 140–400)
RBC: 4.69 10*6/uL (ref 4.20–5.80)
RDW: 11.8 % (ref 11.0–15.0)
WBC: 7.2 10*3/uL (ref 3.8–10.8)

## 2020-08-15 LAB — IRON,TIBC AND FERRITIN PANEL
%SAT: 22 % (calc) (ref 20–48)
Ferritin: 29 ng/mL (ref 24–380)
Iron: 76 ug/dL (ref 50–180)
TIBC: 339 mcg/dL (calc) (ref 250–425)

## 2020-08-15 LAB — HEMOGLOBIN A1C
Hgb A1c MFr Bld: 6 % of total Hgb — ABNORMAL HIGH (ref <5.7)
Mean Plasma Glucose: 126 (calc)
eAG (mmol/L): 7 (calc)

## 2020-08-15 LAB — VITAMIN B12: Vitamin B-12: 951 pg/mL (ref 200–1100)

## 2020-08-15 MED ORDER — TECHNETIUM TC 99M MEDRONATE IV KIT
21.8000 | PACK | Freq: Once | INTRAVENOUS | Status: AC | PRN
  Administered 2020-08-15: 21.8 via INTRAVENOUS

## 2020-08-15 NOTE — Progress Notes (Signed)
Letter created and sent to pt as requested

## 2020-08-15 NOTE — Progress Notes (Signed)
Pt request copy of labs

## 2020-08-15 NOTE — Progress Notes (Signed)
LMOM to return call.

## 2020-08-23 ENCOUNTER — Ambulatory Visit (INDEPENDENT_AMBULATORY_CARE_PROVIDER_SITE_OTHER): Payer: Medicare Other | Admitting: *Deleted

## 2020-08-23 ENCOUNTER — Other Ambulatory Visit: Payer: Self-pay

## 2020-08-23 DIAGNOSIS — Z5181 Encounter for therapeutic drug level monitoring: Secondary | ICD-10-CM | POA: Diagnosis not present

## 2020-08-23 DIAGNOSIS — I4891 Unspecified atrial fibrillation: Secondary | ICD-10-CM

## 2020-08-23 LAB — POCT INR: INR: 2.4 (ref 2.0–3.0)

## 2020-08-23 NOTE — Patient Instructions (Signed)
Description   Continue taking the dose you have been taking which is 1/2 tablet daily except 1 tablet on Mondays, Wednesdays, and Fridays.  Recheck INR in 6 weeks. Call [phone] if scheduled for any procedures or on any new medications    

## 2020-09-11 ENCOUNTER — Other Ambulatory Visit (HOSPITAL_COMMUNITY): Payer: Self-pay

## 2020-09-11 MED ORDER — POTASSIUM CHLORIDE CRYS ER 20 MEQ PO TBCR: 40.0000 meq | EXTENDED_RELEASE_TABLET | Freq: Every day | ORAL | 3 refills | Status: DC

## 2020-09-12 LAB — PSA: PSA: 1.7

## 2020-09-17 ENCOUNTER — Other Ambulatory Visit: Payer: Self-pay

## 2020-09-17 ENCOUNTER — Other Ambulatory Visit (HOSPITAL_COMMUNITY): Payer: Self-pay | Admitting: *Deleted

## 2020-09-17 MED ORDER — POTASSIUM CHLORIDE CRYS ER 20 MEQ PO TBCR: 40.0000 meq | EXTENDED_RELEASE_TABLET | Freq: Every day | ORAL | 3 refills | Status: DC

## 2020-10-04 ENCOUNTER — Other Ambulatory Visit: Payer: Self-pay

## 2020-10-04 ENCOUNTER — Ambulatory Visit (INDEPENDENT_AMBULATORY_CARE_PROVIDER_SITE_OTHER): Payer: Medicare Other | Admitting: *Deleted

## 2020-10-04 DIAGNOSIS — Z5181 Encounter for therapeutic drug level monitoring: Secondary | ICD-10-CM

## 2020-10-04 DIAGNOSIS — I4891 Unspecified atrial fibrillation: Secondary | ICD-10-CM | POA: Diagnosis not present

## 2020-10-04 LAB — POCT INR: INR: 2.9 (ref 2.0–3.0)

## 2020-10-04 NOTE — Patient Instructions (Signed)
Description   Continue taking the dose you have been taking which is 1/2 tablet daily except 1 tablet on Mondays, Wednesdays, and Fridays.  Recheck INR in 6 weeks. Call [phone] if scheduled for any procedures or on any new medications

## 2020-10-09 ENCOUNTER — Other Ambulatory Visit (HOSPITAL_COMMUNITY): Payer: Self-pay | Admitting: Internal Medicine

## 2020-10-09 DIAGNOSIS — I480 Paroxysmal atrial fibrillation: Secondary | ICD-10-CM

## 2020-10-29 ENCOUNTER — Inpatient Hospital Stay: Payer: Medicare Other | Attending: Hematology & Oncology

## 2020-10-29 ENCOUNTER — Inpatient Hospital Stay (HOSPITAL_BASED_OUTPATIENT_CLINIC_OR_DEPARTMENT_OTHER): Payer: Medicare Other | Admitting: Hematology & Oncology

## 2020-10-29 ENCOUNTER — Other Ambulatory Visit: Payer: Self-pay

## 2020-10-29 ENCOUNTER — Encounter: Payer: Self-pay | Admitting: Hematology & Oncology

## 2020-10-29 ENCOUNTER — Telehealth: Payer: Self-pay | Admitting: Hematology & Oncology

## 2020-10-29 VITALS — BP 106/55 | HR 67 | Temp 98.0°F | Resp 18

## 2020-10-29 DIAGNOSIS — Z7901 Long term (current) use of anticoagulants: Secondary | ICD-10-CM | POA: Diagnosis not present

## 2020-10-29 DIAGNOSIS — D500 Iron deficiency anemia secondary to blood loss (chronic): Secondary | ICD-10-CM

## 2020-10-29 DIAGNOSIS — D696 Thrombocytopenia, unspecified: Secondary | ICD-10-CM | POA: Insufficient documentation

## 2020-10-29 DIAGNOSIS — Z79899 Other long term (current) drug therapy: Secondary | ICD-10-CM | POA: Diagnosis not present

## 2020-10-29 LAB — PLATELET BY CITRATE

## 2020-10-29 LAB — CBC WITH DIFFERENTIAL (CANCER CENTER ONLY)
Abs Immature Granulocytes: 0.05 10*3/uL (ref 0.00–0.07)
Basophils Absolute: 0 10*3/uL (ref 0.0–0.1)
Basophils Relative: 0 %
Eosinophils Absolute: 0 10*3/uL (ref 0.0–0.5)
Eosinophils Relative: 0 %
HCT: 42.8 % (ref 39.0–52.0)
Hemoglobin: 13.9 g/dL (ref 13.0–17.0)
Immature Granulocytes: 1 %
Lymphocytes Relative: 23 %
Lymphs Abs: 1.7 10*3/uL (ref 0.7–4.0)
MCH: 30.5 pg (ref 26.0–34.0)
MCHC: 32.5 g/dL (ref 30.0–36.0)
MCV: 93.9 fL (ref 80.0–100.0)
Monocytes Absolute: 1.5 10*3/uL — ABNORMAL HIGH (ref 0.1–1.0)
Monocytes Relative: 19 %
Neutro Abs: 4.2 10*3/uL (ref 1.7–7.7)
Neutrophils Relative %: 57 %
Platelet Count: 73 10*3/uL — ABNORMAL LOW (ref 150–400)
RBC: 4.56 MIL/uL (ref 4.22–5.81)
RDW: 12.9 % (ref 11.5–15.5)
WBC Count: 7.5 10*3/uL (ref 4.0–10.5)
nRBC: 0 % (ref 0.0–0.2)

## 2020-10-29 LAB — SAVE SMEAR(SSMR), FOR PROVIDER SLIDE REVIEW

## 2020-10-29 NOTE — Progress Notes (Signed)
Hematology and Oncology Follow Up Visit  [name] [name] [id number] 19-Jun-1931 86 y.o. 10/29/2020   Principle Diagnosis:   Chronic thrombocytopenia-immune-based versus medication  Current Therapy:    Observation     Interim History:  [name] [name] is back for follow-up. We see him every 6 months. Right now, he is undergoing treatment for what sounds like recurrent prostate cancer. He is on antiandrogen therapy.  He has had no problems with respect to bleeding. He is on Coumadin because of the atrial fibrillation. He is also on amiodarone.  He has had no problems with nausea or vomiting. He has had no issues with fever.  He has had issues with the neuropathy. It is really hard for him to walk.  He has had no problems with rashes.  Overall, I would say his performance status is ECOG 2.   Medications:  Current Outpatient Medications:  .  acetaminophen (TYLENOL) 500 MG tablet, Take 1,000 mg by mouth every 6 (six) hours as needed for mild pain., Disp: , Rfl:  .  amiodarone (PACERONE) 200 MG tablet, Take 100 mg by mouth daily., Disp: , Rfl:  .  b complex vitamins tablet, Take 1 tablet by mouth daily., Disp: , Rfl:  .  cycloSPORINE (RESTASIS) 0.05 % ophthalmic emulsion, Place 1 drop into both eyes 2 (two) times daily., Disp: , Rfl:  .  fluticasone (FLONASE) 50 MCG/ACT nasal spray, Place 2 sprays into both nostrils daily. , Disp: , Rfl: 11 .  gabapentin (NEURONTIN) 300 MG capsule, Take 300 mg by mouth 3 (three) times daily., Disp: , Rfl:  .  Multiple Vitamin (MULTIVITAMIN WITH MINERALS) TABS tablet, Take 1 tablet by mouth every evening. , Disp: , Rfl:  .  potassium chloride SA (KLOR-CON M20) 20 MEQ tablet, Take 2 tablets (40 mEq total) by mouth daily., Disp: 180 tablet, Rfl: 3 .  rosuvastatin (CRESTOR) 5 MG tablet, TAKE 1 TABLET BY MOUTH  DAILY, Disp: 90 tablet, Rfl: 3 .  torsemide (DEMADEX) 20 MG tablet, TAKE 2 TABLETS BY MOUTH  DAILY, Disp: 180 tablet, Rfl: 3 .  traZODone (DESYREL)  100 MG tablet, Take 200 mg by mouth at bedtime., Disp: , Rfl:  .  warfarin (COUMADIN) 5 MG tablet, TAKE 1 TABLET BY MOUTH  DAILY AS DIRECTED BY THE  COUMADIN CLINIC, Disp: 90 tablet, Rfl: 3 .  amoxicillin (AMOXIL) 500 MG capsule, SMARTSIG:4 Capsule(s) By Mouth (Patient not taking: Reported on 10/29/2020), Disp: , Rfl:   Allergies: No Known Allergies  Past Medical History, Surgical history, Social history, and Family History were reviewed and updated.  Review of Systems: Review of Systems  Constitutional: Negative.   HENT:  Negative.   Eyes: Negative.   Respiratory: Negative.   Cardiovascular: Negative.   Gastrointestinal: Negative.   Endocrine: Negative.   Genitourinary: Negative.    Musculoskeletal: Positive for arthralgias.  Skin: Negative.   Neurological: Negative.   Hematological: Negative.   Psychiatric/Behavioral: Negative.     Physical Exam:  oral temperature is 98 F (36.7 C). His blood pressure is 106/55 (abnormal) and his pulse is 67. His respiration is 18 and oxygen saturation is 96%.   Wt Readings from Last 3 Encounters:  08/14/20 171 lb (77.6 kg)  07/19/20 171 lb 6.4 oz (77.7 kg)  01/04/20 175 lb 9.6 oz (79.7 kg)    Physical Exam Vitals reviewed.  HENT:     Head: Normocephalic and atraumatic.  Eyes:     Pupils: Pupils are equal, round, and reactive to light.  Cardiovascular:     Rate and Rhythm: Normal rate and regular rhythm.     Heart sounds: Normal heart sounds.  Pulmonary:     Effort: Pulmonary effort is normal.     Breath sounds: Normal breath sounds.  Abdominal:     General: Bowel sounds are normal.     Palpations: Abdomen is soft.  Musculoskeletal:        General: No tenderness or deformity. Normal range of motion.     Cervical back: Normal range of motion.  Lymphadenopathy:     Cervical: No cervical adenopathy.  Skin:    General: Skin is warm and dry.     Findings: No erythema or rash.  Neurological:     Mental Status: He is alert and  oriented to person, place, and time.  Psychiatric:        Behavior: Behavior normal.        Thought Content: Thought content normal.        Judgment: Judgment normal.      Lab Results  Component Value Date   WBC 7.5 10/29/2020   HGB 13.9 10/29/2020   HCT 42.8 10/29/2020   MCV 93.9 10/29/2020   PLT 73 (L) 10/29/2020     Chemistry      Component Value Date/Time   NA 142 08/14/2020 1241   NA 143 02/23/2017 1103   K 4.6 08/14/2020 1241   CL 104 08/14/2020 1241   CO2 28 08/14/2020 1241   BUN 20 08/14/2020 1241   BUN 25 02/23/2017 1103   CREATININE 0.92 08/14/2020 1241      Component Value Date/Time   CALCIUM 9.8 08/14/2020 1241   ALKPHOS 41 07/19/2020 1213   AST 15 08/14/2020 1241   AST 16 05/16/2020 1132   ALT 11 08/14/2020 1241   ALT 11 05/16/2020 1132   BILITOT 0.8 08/14/2020 1241   BILITOT 0.8 05/16/2020 1132       Impression and Plan: [name] [name] is a 86 year old white male.  He has mild thrombocytopenia.  It is no surprise that the platelet count fluctuates.  I think this is how it will always be for [name] [name].  Again the bruising is no surprise.  I do not see a problem with him being on Coumadin. His platelet count really is stable.  For right now, I will still plan on 41-month follow-up.   Volanda Napoleon, MD 12/13/202112:44 PM

## 2020-10-29 NOTE — Telephone Encounter (Signed)
Appointments scheduled calendar printed per 12/13 los 

## 2020-10-30 LAB — IRON AND TIBC
Iron: 82 ug/dL (ref 42–163)
Saturation Ratios: 26 % (ref 20–55)
TIBC: 309 ug/dL (ref 202–409)
UIBC: 227 ug/dL (ref 117–376)

## 2020-10-30 LAB — FERRITIN: Ferritin: 43 ng/mL (ref 24–336)

## 2020-11-14 ENCOUNTER — Ambulatory Visit (INDEPENDENT_AMBULATORY_CARE_PROVIDER_SITE_OTHER): Payer: Medicare Other | Admitting: *Deleted

## 2020-11-14 ENCOUNTER — Other Ambulatory Visit: Payer: Self-pay

## 2020-11-14 DIAGNOSIS — I4891 Unspecified atrial fibrillation: Secondary | ICD-10-CM

## 2020-11-14 DIAGNOSIS — Z5181 Encounter for therapeutic drug level monitoring: Secondary | ICD-10-CM

## 2020-11-14 LAB — POCT INR: INR: 3.1 — AB (ref 2.0–3.0)

## 2020-11-14 NOTE — Patient Instructions (Signed)
Description   Today take 1/2 tablet only of Warfarin then continue taking 1/2 tablet daily except 1 tablet on Mondays, Wednesdays, and Fridays.  Recheck INR in 4 weeks. Call [phone] if scheduled for any procedures or on any new medications

## 2020-12-13 ENCOUNTER — Other Ambulatory Visit: Payer: Self-pay

## 2020-12-13 ENCOUNTER — Other Ambulatory Visit (HOSPITAL_COMMUNITY): Payer: Self-pay | Admitting: Internal Medicine

## 2020-12-13 ENCOUNTER — Ambulatory Visit (INDEPENDENT_AMBULATORY_CARE_PROVIDER_SITE_OTHER): Payer: Medicare Other | Admitting: *Deleted

## 2020-12-13 DIAGNOSIS — I4891 Unspecified atrial fibrillation: Secondary | ICD-10-CM

## 2020-12-13 DIAGNOSIS — Z5181 Encounter for therapeutic drug level monitoring: Secondary | ICD-10-CM | POA: Diagnosis not present

## 2020-12-13 LAB — POCT INR: INR: 3.1 — AB (ref 2.0–3.0)

## 2020-12-13 NOTE — Patient Instructions (Addendum)
Description   Do not take your Warfarin today then start taking 1/2 tablet daily except 1 tablet on Mondays and Fridays.  Recheck INR in 3 weeks. Call [phone] if scheduled for any procedures or on any new medications

## 2020-12-16 ENCOUNTER — Emergency Department (HOSPITAL_BASED_OUTPATIENT_CLINIC_OR_DEPARTMENT_OTHER): Payer: Medicare Other

## 2020-12-16 ENCOUNTER — Emergency Department (HOSPITAL_BASED_OUTPATIENT_CLINIC_OR_DEPARTMENT_OTHER)
Admission: EM | Admit: 2020-12-16 | Discharge: 2020-12-16 | Disposition: A | Discharge status: Home / Self Care | Payer: Medicare Other | Attending: Emergency Medicine | Admitting: Emergency Medicine

## 2020-12-16 ENCOUNTER — Encounter (HOSPITAL_BASED_OUTPATIENT_CLINIC_OR_DEPARTMENT_OTHER): Payer: Self-pay | Admitting: Emergency Medicine

## 2020-12-16 ENCOUNTER — Other Ambulatory Visit: Payer: Self-pay

## 2020-12-16 DIAGNOSIS — S92354A Nondisplaced fracture of fifth metatarsal bone, right foot, initial encounter for closed fracture: Secondary | ICD-10-CM | POA: Diagnosis not present

## 2020-12-16 DIAGNOSIS — Z7901 Long term (current) use of anticoagulants: Secondary | ICD-10-CM | POA: Insufficient documentation

## 2020-12-16 DIAGNOSIS — M25561 Pain in right knee: Secondary | ICD-10-CM | POA: Insufficient documentation

## 2020-12-16 DIAGNOSIS — W19XXXA Unspecified fall, initial encounter: Secondary | ICD-10-CM

## 2020-12-16 DIAGNOSIS — S8291XA Unspecified fracture of right lower leg, initial encounter for closed fracture: Secondary | ICD-10-CM | POA: Diagnosis not present

## 2020-12-16 DIAGNOSIS — W06XXXA Fall from bed, initial encounter: Secondary | ICD-10-CM | POA: Insufficient documentation

## 2020-12-16 DIAGNOSIS — S82891A Other fracture of right lower leg, initial encounter for closed fracture: Secondary | ICD-10-CM

## 2020-12-16 DIAGNOSIS — S99921A Unspecified injury of right foot, initial encounter: Secondary | ICD-10-CM | POA: Diagnosis present

## 2020-12-16 DIAGNOSIS — Z8546 Personal history of malignant neoplasm of prostate: Secondary | ICD-10-CM | POA: Insufficient documentation

## 2020-12-16 DIAGNOSIS — Z79899 Other long term (current) drug therapy: Secondary | ICD-10-CM | POA: Insufficient documentation

## 2020-12-16 DIAGNOSIS — I10 Essential (primary) hypertension: Secondary | ICD-10-CM | POA: Insufficient documentation

## 2020-12-16 NOTE — Discharge Instructions (Addendum)
Use his boot.  Minimal weightbearing.  Uses walker.  Follow-up with his orthopedic surgeon in around a week.

## 2020-12-16 NOTE — ED Triage Notes (Signed)
Pt arrives pov with report of fall this am, reports right knee and ankle pain. Swelling noted. Pts daughter with patient. Pt endorses pain increases with ambulation. Pt denies loc, denies hitting head

## 2020-12-16 NOTE — ED Notes (Signed)
Pt. Daughter at bedside of Pt.  Pt. Is alert and at normal state of orientation per his daughter.

## 2020-12-16 NOTE — ED Provider Notes (Signed)
[facility] EMERGENCY DEPARTMENT Provider Note   CSN: [id number] Arrival date & time: 12/16/20  1153     History Chief Complaint  Patient presents with  . Fall    [name] [name] is a 87 y.o. male.  HPI Patient presents with right lower extremity injury after fall.  States he was stepping out of bed and twisted his leg and fell.  Complaining of some pain in the right knee and pain in the foot and ankle.  He is on anticoagulation but did not hit his head.  No loss conscious.  No chest back or abdominal pain.  No upper extremity pain.  States has arthritis in his knee.  No neck pain.  States he will sometimes get swelling in his knee also.    Past Medical History:  Diagnosis Date  . Abnormality of gait 05/27/2016  . Adenomatous polyps   . Carpal tunnel syndrome 06/25/2016   Right  . Depressive disorder, not elsewhere classified   . First degree AV block    Holter 3/18: NSR, PACs, PVCs, no AFib, no pauses.  Marland Kitchen Hereditary and idiopathic peripheral neuropathy 06/25/2016  . Hypertension   . Internal nasal lesion 05/15/2013  . Melanoma ([place])    Left Shoulder  . Mitral regurgitation   . MVP (mitral valve prolapse)    a. With severe MR s/p Complex valvuloplasty including artificial Gore-tex neochord placement x4, chordal transposition x1, chordal release x1, # 32 mm Sorin Memo 3D Ring Annuloplasty 2012. // b. Echo 2/18: mild LVH, EF 50-55, mild AI, MV repair with mild MR, mod LAE, mod RVE, severe RAE, severe TR  . Neuropathy   . Normal coronary arteries    a. Normal coronary anatomy by cath 2012.  . Osteoarthritis    Knees  . PAF (paroxysmal atrial fibrillation) ([place])    a. Post-op MVR 2012.  Marland Kitchen Personal history of colonic polyps   . Prostate cancer ([place])   . Pulmonary HTN ([place])    a. Mild-mod by cath 2012.  . Pure hypercholesterolemia   . PVC (premature ventricular contraction)   . Thrombocytopenia ([place])   . Vision abnormalities    Cornea scarring    Patient Active  Problem List   Diagnosis Date Noted  . Hyperglycemia 08/14/2020  . High serum vitamin B12 08/14/2020  . Anemia 03/11/2020  . Spinal stenosis of lumbar region with neurogenic claudication 01/11/2020  . Breast mass in male 09/29/2019  . Hypoxia 04/01/2019  . Knee pain, bilateral 08/08/2018  . Mass of right hand 04/12/2018  . Chronic renal insufficiency 10/06/2017  . Spondylosis without myelopathy or radiculopathy, lumbar region 09/16/2017  . Hypokalemia 04/30/2017  . PAH (pulmonary artery hypertension) ([place])   . Right-sided heart failure ([place]) 04/26/2017  . Chronic diastolic heart failure ([place]) 04/21/2017  . Pulmonary hypertension, primary ([place]) 04/21/2017  . Severe tricuspid regurgitation 03/12/2017  . Bilateral lower extremity edema 02/25/2017  . Anticoagulated 02/25/2017  . Varicose veins of right lower extremity with complications 0000000  . Obstructive lung disease (generalized) ([place]) 09/26/2016  . Carpal tunnel syndrome 06/25/2016  . Hereditary and idiopathic peripheral neuropathy 06/25/2016  . Paresthesia 05/27/2016  . Abnormality of gait 05/27/2016  . Constipation 06/13/2015  . Encounter for therapeutic drug monitoring 05/25/2014  . Syncope 04/03/2014  . Low back pain 04/19/2013  . Melanoma ([place]) 09/30/2012  . Cough 03/26/2012  . Hx of mitral valve repair 11/19/2011  . Long term (current) use of anticoagulants 11/03/2011  . Pleural effusion due to  congestive heart failure ([place]) 10/28/2011  . Atrial fibrillation ([place]) 10/07/2011  . S/P mitral valve repair 10/01/2011  . Valvular heart disease 08/21/2011  . Hearing loss 04/09/2011  . Thrombocytopenia ([place]) 09/19/2010  . ADENOCARCINOMA, PROSTATE 09/17/2010  . CALLUS, LEFT FOOT 09/17/2010  . Backache 09/17/2010  . TINEA PEDIS 05/28/2009  . DERMATITIS, ATOPIC 04/10/2009  . HYPERCHOLESTEROLEMIA 06/09/2008  . DEPRESSION 06/09/2008  . HYPERTENSION, BENIGN ESSENTIAL 06/09/2008  . GERD 06/09/2008  . OSTEOARTHRITIS,  GENERALIZED, MULTIPLE JOINTS 06/09/2008  . MUSCLE SPASM, BACK 06/09/2008  . H/O prostate cancer 06/09/2008  . PERSONAL HISTORY OF MALIGNANT MELANOMA OF SKIN 06/09/2008  . ARRHYTHMIA, HX OF 06/09/2008  . Personal history of colonic polyps 06/09/2008    Past Surgical History:  Procedure Laterality Date  . CARDIAC CATHETERIZATION  09/2011   Pre-op for MVR -- normal coronaries.  Marland Kitchen CARDIOVERSION N/A 01/02/2016   Procedure: CARDIOVERSION;  Surgeon: Thayer Headings, MD;  Location: [facility] ENDOSCOPY;  Service: Cardiovascular;  Laterality: N/A;  . COLONOSCOPY W/ POLYPECTOMY    . INGUINAL HERNIA REPAIR  09/2009   Left  . KNEE ARTHROSCOPY      left x3  and right x2  . Melanoma Surgery     2001, 2005, 2006, 2009  . MITRAL VALVE REPAIR  10/01/2011   complex valvuloplasty with Goretex cord replacement and chordal transposition 71mm Sorin Memo 3D ring annuloplasty  . Nuclear Stress Test  09/2006   EF-64%, Normal  . PROSTATECTOMY  1993  . RIGHT HEART CATH N/A 04/29/2017   Procedure: Right Heart Cath;  Surgeon: Jolaine Artist, MD;  Location: [facility] CV LAB;  Service: Cardiovascular;  Laterality: N/A;  . ROOT CANAL  08-19-12  . ROTATOR CUFF REPAIR  2003   left  . TEE WITHOUT CARDIOVERSION  09/26/2011   Procedure: TRANSESOPHAGEAL ECHOCARDIOGRAM (TEE);  Surgeon: Lelon Perla, MD;  Location: [facility] ENDOSCOPY;  Service: Cardiovascular;  Laterality: N/A;  . US ECHOCARDIOGRAPHY  09/2009, 08/1011   mild LVH,mild AI,MVP with mild MR, mild-mod. TR with mild Pulm. HTN, EF-55-60%       Family History  Problem Relation Age of Onset  . Clotting disorder Brother        CVA's  . Arthritis Mother   . Hypertension Mother   . Stroke Mother   . Hypertension Father   . Psychosis Father        psychiatric care  . Colon cancer Neg Hx   . Stomach cancer Neg Hx   . Heart attack Neg Hx   . Prostate cancer Neg Hx   . Pancreatic cancer Neg Hx     Social History   Tobacco Use  . Smoking status: Never  Smoker  . Smokeless tobacco: Never Used  Vaping Use  . Vaping Use: Never used  Substance Use Topics  . Alcohol use: No    Alcohol/week: 0.0 standard drinks    Comment: Last drink in 2000  . Drug use: No    Home Medications Prior to Admission medications   Medication Sig Start Date End Date Taking? Authorizing Provider  acetaminophen (TYLENOL) 500 MG tablet Take 1,000 mg by mouth every 6 (six) hours as needed for mild pain.    [provider]  amiodarone (PACERONE) 200 MG tablet Take 0.5 tablets (100 mg total) by mouth daily. 12/13/20   Bensimhon, Shaune Pascal, MD  amoxicillin (AMOXIL) 500 MG capsule SMARTSIG:4 Capsule(s) By Mouth Patient not taking: Reported on 10/29/2020 10/22/20   [provider]  b complex  vitamins tablet Take 1 tablet by mouth daily.    [provider]  cycloSPORINE (RESTASIS) 0.05 % ophthalmic emulsion Place 1 drop into both eyes 2 (two) times daily.    [provider]  fluticasone (FLONASE) 50 MCG/ACT nasal spray Place 2 sprays into both nostrils daily.  04/28/18   [provider]  gabapentin (NEURONTIN) 300 MG capsule Take 300 mg by mouth 3 (three) times daily.    [provider]  Multiple Vitamin (MULTIVITAMIN WITH MINERALS) TABS tablet Take 1 tablet by mouth every evening.     [provider]  potassium chloride SA (KLOR-CON M20) 20 MEQ tablet Take 2 tablets (40 mEq total) by mouth daily. 09/17/20   Bensimhon, Shaune Pascal, MD  rosuvastatin (CRESTOR) 5 MG tablet TAKE 1 TABLET BY MOUTH  DAILY 03/16/20   Bensimhon, Shaune Pascal, MD  torsemide (DEMADEX) 20 MG tablet TAKE 2 TABLETS BY MOUTH  DAILY 04/17/20   Bensimhon, Shaune Pascal, MD  traZODone (DESYREL) 100 MG tablet Take 200 mg by mouth at bedtime.    [provider]  warfarin (COUMADIN) 5 MG tablet TAKE 1 TABLET BY MOUTH  DAILY AS DIRECTED BY THE  COUMADIN CLINIC 10/10/20   Bensimhon, Shaune Pascal, MD  pantoprazole (PROTONIX) 40 MG tablet Take 1 tablet (40 mg total)  by mouth daily before breakfast. 10/16/11 06/29/19  Nani Skillern, PA-C    Allergies    Patient has no known allergies.  Review of Systems   Review of Systems  Constitutional: Negative for appetite change.  HENT: Negative for congestion.   Respiratory: Negative for shortness of breath.   Cardiovascular: Negative for chest pain.  Gastrointestinal: Negative for abdominal pain.  Genitourinary: Negative for flank pain.  Musculoskeletal: Negative for back pain and neck pain.       Right knee right ankle and right foot pain.  Skin: Negative for wound.  Neurological: Negative for weakness and numbness.  Psychiatric/Behavioral: Negative for confusion.    Physical Exam Updated Vital Signs BP 121/61   Pulse 84   Temp 98.1 F (36.7 C) (Oral)   Resp 20   SpO2 96%   Physical Exam Vitals and nursing note reviewed.  Constitutional:      Appearance: Normal appearance.  HENT:     Head: Atraumatic.  Eyes:     Extraocular Movements: Extraocular movements intact.  Neck:     Comments: No midline tenderness. Cardiovascular:     Rate and Rhythm: Normal rate.  Pulmonary:     Breath sounds: No wheezing.  Chest:     Chest wall: No tenderness.  Abdominal:     Tenderness: There is no abdominal tenderness.  Musculoskeletal:     Cervical back: Neck supple.     Comments: No lumbar tenderness.  No hip tenderness.  Mild pain over right knee but good range of motion.  Only minimal swelling.  No ecchymosis.  Does have some tenderness over right ankle bilaterally.  No real deformity.  Also some tenderness over base of fifth metatarsal.  Some chronic venous changes of right lower extremity.  Neurological:     Mental Status: He is alert.  Psychiatric:        Mood and Affect: Mood normal.     ED Results / Procedures / Treatments   Labs (all labs ordered are listed, but only abnormal results are displayed) Labs Reviewed - No data to display  EKG None  Radiology DG Ankle Complete  Right  Result Date: 12/16/2020 CLINICAL DATA:  Fall, pain. EXAM: RIGHT ANKLE - COMPLETE 3+ VIEW COMPARISON:  None. FINDINGS: Questionable nondisplaced fracture at the lateral margin of the medial malleolus. Distal tibia appears otherwise intact and normally aligned. No fracture or dislocation of the distal fibula. Talar dome appears intact and normally aligned. Probable slightly displaced fracture at the base of the fifth metacarpal bone. Visualized osseous structures of the hindfoot and midfoot appear otherwise intact and normally aligned. IMPRESSION: 1. Questionable nondisplaced fracture at the lateral margin of the medial malleolus. 2. Probable slightly displaced fracture at the base of the fifth metacarpal bone. Electronically Signed   By: Franki Cabot M.D.   On: 12/16/2020 13:23   DG Knee Complete 4 Views Right  Result Date: 12/16/2020 CLINICAL DATA:  Fall, swelling. EXAM: RIGHT KNEE - COMPLETE 4+ VIEW COMPARISON:  None. FINDINGS: Characterization of osseous detail is slightly limited by osteopenia, but there is no fracture line or displaced fracture fragment seen. No evidence of acute osseous dislocation at the RIGHT knee. Mild tricompartmental degenerative change with associated joint space narrowing and osseous spurring. Joint effusion noted within the suprapatellar bursa. Superficial soft tissues are unremarkable. IMPRESSION: 1. No acute osseous abnormality. No osseous fracture or dislocation seen. 2. Joint effusion. 3. Mild tricompartmental degenerative osteoarthritis. Electronically Signed   By: Franki Cabot M.D.   On: 12/16/2020 13:21   DG Foot Complete Right  Result Date: 12/16/2020 CLINICAL DATA:  Fall, pain with ambulation. EXAM: RIGHT FOOT COMPLETE - 3+ VIEW COMPARISON:  None. FINDINGS: Slightly displaced fracture at the base of the fifth metatarsal bone. Characterization of osseous detail is limited by diffuse osteopenia, but no other fracture line or displaced fracture fragment is  identified. Advanced degenerative changes noted at the first PIP joint with degenerative spurring and osseous fusion. Old healed fracture within the mid shaft of the second metatarsal bone. IMPRESSION: 1. Slightly displaced fracture at the base of the fifth metatarsal bone. 2. Osteopenia. 3. Advanced degenerative osteoarthritis at the first PIP joint. 4. Old healed fracture within the mid shaft of the second metatarsal bone. Electronically Signed   By: Franki Cabot M.D.   On: 12/16/2020 14:17    Procedures Procedures   Medications Ordered in ED Medications - No data to display  ED Course  I have reviewed the triage vital signs and the nursing notes.  Pertinent labs & imaging results that were available during my care of the patient were reviewed by me and considered in my medical decision making (see chart for details).    MDM Rules/Calculators/A&P                          Patient with fall.  Right lower extremity injury.  Negative knee x-ray for acute fracture.  Discussed with patient possibility of acute occult fracture or other derangement of the knee.  Can follow-up with his orthopedic surgeon.  However there is potentially a medial malleolar fracture and apparent fracture of the base of the fifth metatarsal.  Patient has a walking boot at home.  Will wear this and use it as a splint.  Will not however walk on it for now.  Will use a walker also.  Patient has an orthopedic surgeon that he can follow-up with.  Will discharge home. Final Clinical Impression(s) / ED Diagnoses Final diagnoses:  Fall, initial encounter  Closed fracture of right ankle, initial encounter  Closed nondisplaced fracture of fifth metatarsal bone of right foot, initial encounter    Rx /  DC Orders ED Discharge Orders    None       Davonna Belling, MD 12/16/20 [phone]

## 2020-12-20 ENCOUNTER — Telehealth: Payer: Self-pay

## 2020-12-20 ENCOUNTER — Ambulatory Visit: Payer: Medicare Other | Admitting: Family Medicine

## 2020-12-20 NOTE — Telephone Encounter (Signed)
Called and left a VM for patient's son Elta Guadeloupe to return my call concerning appointment.

## 2020-12-21 ENCOUNTER — Telehealth: Payer: Self-pay

## 2020-12-21 NOTE — Telephone Encounter (Signed)
Patients son called he stated he wants to know if his dad should be wearing the boot 24/7 if the patient isn't walking, he sleeps in a recliner and he still wears the boot. XJ:[phone]

## 2020-12-21 NOTE — Telephone Encounter (Signed)
Daughter notified.  She will contact patients urologist and discuss with them.  She would like to keep appt next Tuesday to possibly discuss HH.

## 2020-12-21 NOTE — Telephone Encounter (Signed)
Spoke with daughter advised her we definitly wouldnt recommend catheter just as a convenience d/t infection. We do agree with urinal, or beside commode or even HH order to assess environment or supply needs.  She states that pt is soaked when he wakes up in the morning, even when wearing a depend.  He does not get up.  His skin is a little red on the back of his legs.  He does have help that comes during the day to help out but it is during the night where he is soaked with urine.

## 2020-12-21 NOTE — Telephone Encounter (Signed)
appt scheduled with Percell Miller for Tuesday. Daugther would like to speak to someone to see if is even possible for them to get catheter

## 2020-12-21 NOTE — Telephone Encounter (Signed)
I would agree we generally do not recommend a catheter routinely as it greatly increases his chance of developing an infection. If they want to consider a catheter they would have to have a consultation with urology as they are the ones who help to manage catheters. I will place referral if they would like.

## 2020-12-21 NOTE — Telephone Encounter (Signed)
Would you like them to have an appointment with Percell Miller or someone discuss?

## 2020-12-21 NOTE — Telephone Encounter (Signed)
Spoke with patient's son. He said that his father was seen at an urgent care and told that he fractured his foot and ankle in two places. He was not placed into any type of boot or brace. He had an old boot at home that he insisted on wearing once he got home, but was not recommended by urgent care. He has worn this boot nonstop for several days and states that yesterday his skin did not look healthy when they took the boot off. He wanted to know if he could take the boot off while resting or sleeping. I explained that we have not seen him and therefore could not offer medical advise. I then told him that if urgent care did not recommend the boot or brace then they did not find it necessary for him to wear. If he is comfortable without the boot while resting, it should be okay to remove. He will call back with any further questions.

## 2020-12-21 NOTE — Telephone Encounter (Signed)
Daughter calling back to check the status of message

## 2020-12-21 NOTE — Telephone Encounter (Signed)
Nurse Assessment Nurse: Jimmye Norman, RN, Armen Pickup Date/Time Eilene Ghazi Time): 12/21/2020 7:16:24 AM Confirm and document reason for call. If symptomatic, describe symptoms. ---Caller states Father fell last week and fractured his foot. He is in a boot. He is not ambulatory. Having incontinence because he cant get up. Wanting to know if he can have a catheter at home. Suggested a urinal. Does the patient have any new or worsening symptoms? ---No Please document clinical information provided and list any resource used. ---Caller advised to use a bedside urinal that does not require him getting out of bed. Discussed the risk of infection with catheters and in addition patient inability to do catheter care. Advised her to follow up with Physician. Disp. Time Eilene Ghazi Time) Disposition Final User 12/21/2020 7:22:05 AM Clinical Call Yes Jimmye Norman, RN, Armen Pickup

## 2020-12-25 ENCOUNTER — Ambulatory Visit: Payer: Self-pay

## 2020-12-25 ENCOUNTER — Encounter: Payer: Self-pay | Admitting: Orthopaedic Surgery

## 2020-12-25 ENCOUNTER — Ambulatory Visit (INDEPENDENT_AMBULATORY_CARE_PROVIDER_SITE_OTHER): Payer: Medicare Other | Admitting: Orthopaedic Surgery

## 2020-12-25 ENCOUNTER — Ambulatory Visit: Payer: Medicare Other | Admitting: Medical

## 2020-12-25 ENCOUNTER — Other Ambulatory Visit: Payer: Self-pay

## 2020-12-25 DIAGNOSIS — M25571 Pain in right ankle and joints of right foot: Secondary | ICD-10-CM | POA: Diagnosis not present

## 2020-12-25 DIAGNOSIS — S92351A Displaced fracture of fifth metatarsal bone, right foot, initial encounter for closed fracture: Secondary | ICD-10-CM

## 2020-12-25 DIAGNOSIS — M79671 Pain in right foot: Secondary | ICD-10-CM | POA: Diagnosis not present

## 2020-12-25 DIAGNOSIS — S8264XA Nondisplaced fracture of lateral malleolus of right fibula, initial encounter for closed fracture: Secondary | ICD-10-CM

## 2020-12-25 NOTE — Progress Notes (Signed)
Office Visit Note   Patient: [name] [name]           Date of Birth: Feb 02, 1931           MRN: [id number] Visit Date: 12/25/2020              Requested by: [name], MD [address] [place],  [place] [address] PCP: [name], MD   Assessment & Plan: Visit Diagnoses:  1. Pain in right foot   2. Pain in right ankle and joints of right foot     Plan: 9 days status post injury to his right foot and ankle.  Initial x-rays were possibly consistent with a medial malleolus fracture and a fracture of the base of the fifth metatarsal.  Films today reveal nondisplaced fracture of the lateral and medial malleolus and fracture at the base of the fifth metatarsal right foot.  Long discussion regarding treatment options. Mr Flenner is on warfarin and has significant venous stasis changes of the right lower extremity.  Urged him to avoid weightbearing and to wear the equalizer boot.  Has a walker at home. would like to check him back in 2 weeks and repeat the films  Follow-Up Instructions: Return in about 2 weeks (around 01/08/2021).   Orders:  Orders Placed This Encounter  Procedures  . XR Foot 2 Views Right  . XR Ankle 2 Views Right   No orders of the defined types were placed in this encounter.     Procedures: No procedures performed   Clinical Data: No additional findings.   Subjective: Chief Complaint  Patient presents with  . Right Ankle - Pain, Injury    DOI 12/16/2020  Patient presents today for his right ankle. He injured it on 12/16/2020 when he fell at home. He states that his ankle and foot did not hurt until the following day. He went to the [facility] ER on [address] and had x-rays. He states that he was told he fractured his foot/ankle in two places. He was not placed into any type of splint or brace. He went home and found his boot that he had from some time ago and has been wearing it. He said that it does offer some support and helps with his [ain.  He is taking tylenol for pain. He states that his biggest concern is the swelling. He states that the swelling is much improved in the morning, but admits to not elevating it like he should.  I reviewed films of his right ankle and foot on the PACS system.  There could be a nondisplaced fracture of the medial malleolus and even a nondisplaced fracture at the base of the fifth metatarsal (non-Jones fracture).  HPI  Review of Systems   Objective: Vital Signs: Ht 6\' 5"  (1.956 m)   Wt 173 lb (78.5 kg)   BMI 20.51 kg/m   Physical Exam Constitutional:      Appearance: He is well-developed and well-nourished.  HENT:     Mouth/Throat:     Mouth: Oropharynx is clear and moist.  Eyes:     Extraocular Movements: EOM normal.     Pupils: Pupils are equal, round, and reactive to light.  Pulmonary:     Effort: Pulmonary effort is normal.  Skin:    General: Skin is warm and dry.  Neurological:     Mental Status: He is alert and oriented to person, place, and time.  Psychiatric:  Mood and Affect: Mood and affect normal.        Behavior: Behavior normal.     Ortho Exam awake alert and oriented x3.  Comfortable sitting.  Right lower extremity with chronic venous stasis changes.  There is moderate amount of swelling of the right ankle and right foot that he notes is worse as the day progresses.  There is tenderness over the both medial and lateral malleolar I along the base of the fifth metatarsal.  Skin otherwise intact.  Good sensation with no deformity  Specialty Comments:  No specialty comments available.  Imaging: XR Ankle 2 Views Right  Result Date: 12/25/2020 Films of the right ankle obtained in several projections.  There is a nondisplaced vertical fracture of the medial malleolus and a nondisplaced transverse fracture of the lateral malleolus.  Injury occurred approximately 9 days ago  XR Foot 2 Views Right  Result Date: 12/25/2020 Films of the right foot were obtained in 2  projections.  There is a nondisplaced fracture at the base of the fifth metatarsal consistent with an avulsion fracture.    PMFS History: Patient Active Problem List   Diagnosis Date Noted  . Pain in right foot 12/25/2020  . Pain in right ankle and joints of right foot 12/25/2020  . Hyperglycemia 08/14/2020  . High serum vitamin B12 08/14/2020  . Anemia 03/11/2020  . Spinal stenosis of lumbar region with neurogenic claudication 01/11/2020  . Breast mass in male 09/29/2019  . Hypoxia 04/01/2019  . Knee pain, bilateral 08/08/2018  . Mass of right hand 04/12/2018  . Chronic renal insufficiency 10/06/2017  . Spondylosis without myelopathy or radiculopathy, lumbar region 09/16/2017  . Hypokalemia 04/30/2017  . PAH (pulmonary artery hypertension) ([place])   . Right-sided heart failure ([place]) 04/26/2017  . Chronic diastolic heart failure ([place]) 04/21/2017  . Pulmonary hypertension, primary ([place]) 04/21/2017  . Severe tricuspid regurgitation 03/12/2017  . Bilateral lower extremity edema 02/25/2017  . Anticoagulated 02/25/2017  . Varicose veins of right lower extremity with complications 82/50/5397  . Obstructive lung disease (generalized) ([place]) 09/26/2016  . Carpal tunnel syndrome 06/25/2016  . Hereditary and idiopathic peripheral neuropathy 06/25/2016  . Paresthesia 05/27/2016  . Abnormality of gait 05/27/2016  . Constipation 06/13/2015  . Encounter for therapeutic drug monitoring 05/25/2014  . Syncope 04/03/2014  . Low back pain 04/19/2013  . Melanoma ([place]) 09/30/2012  . Cough 03/26/2012  . Hx of mitral valve repair 11/19/2011  . Long term (current) use of anticoagulants 11/03/2011  . Pleural effusion due to congestive heart failure ([place]) 10/28/2011  . Atrial fibrillation ([place]) 10/07/2011  . S/P mitral valve repair 10/01/2011  . Valvular heart disease 08/21/2011  . Hearing loss 04/09/2011  . Thrombocytopenia ([place]) 09/19/2010  . ADENOCARCINOMA, PROSTATE 09/17/2010  . CALLUS, LEFT  FOOT 09/17/2010  . Backache 09/17/2010  . TINEA PEDIS 05/28/2009  . DERMATITIS, ATOPIC 04/10/2009  . HYPERCHOLESTEROLEMIA 06/09/2008  . DEPRESSION 06/09/2008  . HYPERTENSION, BENIGN ESSENTIAL 06/09/2008  . GERD 06/09/2008  . OSTEOARTHRITIS, GENERALIZED, MULTIPLE JOINTS 06/09/2008  . MUSCLE SPASM, BACK 06/09/2008  . H/O prostate cancer 06/09/2008  . PERSONAL HISTORY OF MALIGNANT MELANOMA OF SKIN 06/09/2008  . ARRHYTHMIA, HX OF 06/09/2008  . Personal history of colonic polyps 06/09/2008   Past Medical History:  Diagnosis Date  . Abnormality of gait 05/27/2016  . Adenomatous polyps   . Carpal tunnel syndrome 06/25/2016   Right  . Depressive disorder, not elsewhere classified   . First degree AV block    Holter  3/18: NSR, PACs, PVCs, no AFib, no pauses.  Marland Kitchen Hereditary and idiopathic peripheral neuropathy 06/25/2016  . Hypertension   . Internal nasal lesion 05/15/2013  . Melanoma ([place])    Left Shoulder  . Mitral regurgitation   . MVP (mitral valve prolapse)    a. With severe MR s/p Complex valvuloplasty including artificial Gore-tex neochord placement x4, chordal transposition x1, chordal release x1, # 32 mm Sorin Memo 3D Ring Annuloplasty 2012. // b. Echo 2/18: mild LVH, EF 50-55, mild AI, MV repair with mild MR, mod LAE, mod RVE, severe RAE, severe TR  . Neuropathy   . Normal coronary arteries    a. Normal coronary anatomy by cath 2012.  . Osteoarthritis    Knees  . PAF (paroxysmal atrial fibrillation) ([place])    a. Post-op MVR 2012.  Marland Kitchen Personal history of colonic polyps   . Prostate cancer ([place])   . Pulmonary HTN ([place])    a. Mild-mod by cath 2012.  . Pure hypercholesterolemia   . PVC (premature ventricular contraction)   . Thrombocytopenia ([place])   . Vision abnormalities    Cornea scarring    Family History  Problem Relation Age of Onset  . Clotting disorder Brother        CVA's  . Arthritis Mother   . Hypertension Mother   . Stroke Mother   . Hypertension Father   .  Psychosis Father        psychiatric care  . Colon cancer Neg Hx   . Stomach cancer Neg Hx   . Heart attack Neg Hx   . Prostate cancer Neg Hx   . Pancreatic cancer Neg Hx     Past Surgical History:  Procedure Laterality Date  . CARDIAC CATHETERIZATION  09/2011   Pre-op for MVR -- normal coronaries.  [place] Kitchen CARDIOVERSION N/A 01/02/2016   Procedure: CARDIOVERSION;  Surgeon: Thayer Headings, MD;  Location: [facility] ENDOSCOPY;  Service: Cardiovascular;  Laterality: N/A;  . COLONOSCOPY W/ POLYPECTOMY    . INGUINAL HERNIA REPAIR  09/2009   Left  . KNEE ARTHROSCOPY      left x3  and right x2  . Melanoma Surgery     2001, 2005, 2006, 2009  . MITRAL VALVE REPAIR  10/01/2011   complex valvuloplasty with Goretex cord replacement and chordal transposition 75mm Sorin Memo 3D ring annuloplasty  . Nuclear Stress Test  09/2006   EF-64%, Normal  . PROSTATECTOMY  1993  . RIGHT HEART CATH N/A 04/29/2017   Procedure: Right Heart Cath;  Surgeon: Jolaine Artist, MD;  Location: [place] CV LAB;  Service: Cardiovascular;  Laterality: N/A;  . ROOT CANAL  08-19-12  . ROTATOR CUFF REPAIR  2003   left  . TEE WITHOUT CARDIOVERSION  09/26/2011   Procedure: TRANSESOPHAGEAL ECHOCARDIOGRAM (TEE);  Surgeon: Lelon Perla, MD;  Location: [facility] ENDOSCOPY;  Service: Cardiovascular;  Laterality: N/A;  . US ECHOCARDIOGRAPHY  09/2009, 08/1011   mild LVH,mild AI,MVP with mild MR, mild-mod. TR with mild Pulm. HTN, EF-55-60%   Social History   Occupational History    Comment: retired  Tobacco Use  . Smoking status: Never Smoker  . Smokeless tobacco: Never Used  Vaping Use  . Vaping Use: Never used  Substance and Sexual Activity  . Alcohol use: No    Alcohol/week: 0.0 standard drinks    Comment: Last drink in 2000  . Drug use: No  . Sexual activity: Not Currently

## 2020-12-27 ENCOUNTER — Ambulatory Visit: Payer: Medicare Other | Admitting: Orthopaedic Surgery

## 2020-12-31 ENCOUNTER — Telehealth: Payer: Self-pay | Admitting: Orthopaedic Surgery

## 2020-12-31 NOTE — Telephone Encounter (Signed)
Spoke with patient and relayed information below. He states that his sore is forming on his buttock from sitting in his recliner. I spoke to him about possibly lying on his side some to get the pressure off that area. He will call us back if he notices no improvement.

## 2020-12-31 NOTE — Telephone Encounter (Signed)
Patient called requesting a call back. Patient is stating he has a bed sore that he has been using A&D ointment and asking is there something better he can use for sore. Please call patient at [phone].

## 2020-12-31 NOTE — Telephone Encounter (Signed)
Please call Mr Daisey and tell him antibiotic ointment might be better but should have the ulcer evaluated by Dr Lenon Ahmadi

## 2020-12-31 NOTE — Telephone Encounter (Signed)
Please advise 

## 2021-01-01 NOTE — Progress Notes (Signed)
[facility] [address], [place], [place] [address] [phone]  Date:  01/03/2021   Name:  [name] [name]   DOB:  02-03-1931   MRN:  [id number]  PCP:  [name], MD    Chief Complaint: Bed Sore (2 weeks ago fall , )   History of Present Illness:  [name] [name] is a 87 y.o. very pleasant male patient who presents with the following:  Pt of Dr Charlett Blake here today with concern of a bedsore History of chronic thrombocytopenia managed by hematology, spinal stenosis, status post mitral valve repair, CHF/pulmonary hypertension, prostate cancer, chronic renal insufficiency, atrial fibrillation Taking Coumadin Amiodarone Crestor Torsemide I have seen him in the past, in 2019  The patient lives on his own, he is ambulatory with a walker at baseline.  Unfortunately, he recently had an injury which is causing problems in his ability to care for himself  He fell about 2 weeks ago and fractured his ankle and foot.  He is in a tall cam walker on the right leg At this time no surgery is planned He is having to sleep in a recliner -he is not [name] to get in and out of bed He is incontinent of urine and is using depends.  He is still continent of stool  They have someone coming in from 8:30- 1pm and again from 4- 8 pm to offer assistance.  Not a skilled nurse However, this leaves him on his own overnight and he will have a wet depends for much at this time.  He has developed ulcerations of both buttocks over the last 7 to 10 days, he is also developing ulceration on the left posterior heel  Here today with his daughter    Patient Active Problem List   Diagnosis Date Noted  . Pain in right foot 12/25/2020  . Pain in right ankle and joints of right foot 12/25/2020  . Hyperglycemia 08/14/2020  . High serum vitamin B12 08/14/2020  . Anemia 03/11/2020  . Spinal stenosis of lumbar region with neurogenic claudication  01/11/2020  . Breast mass in male 09/29/2019  . Hypoxia 04/01/2019  . Knee pain, bilateral 08/08/2018  . Mass of right hand 04/12/2018  . Chronic renal insufficiency 10/06/2017  . Spondylosis without myelopathy or radiculopathy, lumbar region 09/16/2017  . Hypokalemia 04/30/2017  . PAH (pulmonary artery hypertension) ([place])   . Right-sided heart failure ([place]) 04/26/2017  . Chronic diastolic heart failure ([place]) 04/21/2017  . Pulmonary hypertension, primary ([place]) 04/21/2017  . Severe tricuspid regurgitation 03/12/2017  . Bilateral lower extremity edema 02/25/2017  . Anticoagulated 02/25/2017  . Varicose veins of right lower extremity with complications 32/35/5732  . Obstructive lung disease (generalized) ([place]) 09/26/2016  . Carpal tunnel syndrome 06/25/2016  . Hereditary and idiopathic peripheral neuropathy 06/25/2016  . Paresthesia 05/27/2016  . Abnormality of gait 05/27/2016  . Constipation 06/13/2015  . Encounter for therapeutic drug monitoring 05/25/2014  . Syncope 04/03/2014  . Low back pain 04/19/2013  . Melanoma ([place]) 09/30/2012  . Cough 03/26/2012  . Hx of mitral valve repair 11/19/2011  . Long term (current) use of anticoagulants 11/03/2011  . Pleural effusion due to congestive heart failure ([place]) 10/28/2011  . Atrial fibrillation ([place]) 10/07/2011  . S/P mitral valve repair 10/01/2011  . Valvular heart disease 08/21/2011  . Hearing loss 04/09/2011  . Thrombocytopenia ([place]) 09/19/2010  . ADENOCARCINOMA, PROSTATE 09/17/2010  . CALLUS, LEFT FOOT 09/17/2010  .  Backache 09/17/2010  . TINEA PEDIS 05/28/2009  . DERMATITIS, ATOPIC 04/10/2009  . HYPERCHOLESTEROLEMIA 06/09/2008  . DEPRESSION 06/09/2008  . HYPERTENSION, BENIGN ESSENTIAL 06/09/2008  . GERD 06/09/2008  . OSTEOARTHRITIS, GENERALIZED, MULTIPLE JOINTS 06/09/2008  . MUSCLE SPASM, BACK 06/09/2008  . H/O prostate cancer 06/09/2008  . PERSONAL HISTORY OF MALIGNANT MELANOMA OF SKIN 06/09/2008  . ARRHYTHMIA, HX OF  06/09/2008  . Personal history of colonic polyps 06/09/2008    Past Medical History:  Diagnosis Date  . Abnormality of gait 05/27/2016  . Adenomatous polyps   . Carpal tunnel syndrome 06/25/2016   Right  . Depressive disorder, not elsewhere classified   . First degree AV block    Holter 3/18: NSR, PACs, PVCs, no AFib, no pauses.  [place] Kitchen Hereditary and idiopathic peripheral neuropathy 06/25/2016  . Hypertension   . Internal nasal lesion 05/15/2013  . Melanoma ([place])    Left Shoulder  . Mitral regurgitation   . MVP (mitral valve prolapse)    a. With severe MR s/p Complex valvuloplasty including artificial Gore-tex neochord placement x4, chordal transposition x1, chordal release x1, # 32 mm Sorin Memo 3D Ring Annuloplasty 2012. // b. Echo 2/18: mild LVH, EF 50-55, mild AI, MV repair with mild MR, mod LAE, mod RVE, severe RAE, severe TR  . Neuropathy   . Normal coronary arteries    a. Normal coronary anatomy by cath 2012.  . Osteoarthritis    Knees  . PAF (paroxysmal atrial fibrillation) ([place])    a. Post-op MVR 2012.  Mar[place] Kitchen Personal history of colonic polyps   . Prostate cancer ([place])   . Pulmonary HTN ([place])    a. Mild-mod by cath 2012.  . Pure hypercholesterolemia   . PVC (premature ventricular contraction)   . Thrombocytopenia ([place])   . Vision abnormalities    Cornea scarring    Past Surgical History:  Procedure Laterality Date  . CARDIAC CATHETERIZATION  09/2011   Pre-op for MVR -- normal coronaries.  [place] Kitchen CARDIOVERSION N/A 01/02/2016   Procedure: CARDIOVERSION;  Surgeon: Thayer Headings, MD;  Location: [facility] ENDOSCOPY;  Service: Cardiovascular;  Laterality: N/A;  . COLONOSCOPY W/ POLYPECTOMY    . INGUINAL HERNIA REPAIR  09/2009   Left  . KNEE ARTHROSCOPY      left x3  and right x2  . Melanoma Surgery     2001, 2005, 2006, 2009  . MITRAL VALVE REPAIR  10/01/2011   complex valvuloplasty with Goretex cord replacement and chordal transposition 52mm Sorin Memo 3D ring annuloplasty  . Nuclear  Stress Test  09/2006   EF-64%, Normal  . PROSTATECTOMY  1993  . RIGHT HEART CATH N/A 04/29/2017   Procedure: Right Heart Cath;  Surgeon: Jolaine Artist, MD;  Location: [place] CV LAB;  Service: Cardiovascular;  Laterality: N/A;  . ROOT CANAL  08-19-12  . ROTATOR CUFF REPAIR  2003   left  . TEE WITHOUT CARDIOVERSION  09/26/2011   Procedure: TRANSESOPHAGEAL ECHOCARDIOGRAM (TEE);  Surgeon: Lelon Perla, MD;  Location[facility] ENDOSCOPY;  Service: Cardiovascular;  Laterality: N/A;  . US ECHOCARDIOGRAPHY  09/2009, 08/1011   mild LVH,mild AI,MVP with mild MR, mild-mod. TR with mild Pulm. HTN, EF-55-60%    Social History   Tobacco Use  . Smoking status: Never Smoker  . Smokeless tobacco: Never Used  Vaping Use  . Vaping Use: Never used  Substance Use Topics  . Alcohol use: No    Alcohol/week: 0.0 standard drinks    Comment: Last drink in 2000  .  Drug use: No    Family History  Problem Relation Age of Onset  . Clotting disorder Brother        CVA's  . Arthritis Mother   . Hypertension Mother   . Stroke Mother   . Hypertension Father   . Psychosis Father        psychiatric care  . Colon cancer Neg Hx   . Stomach cancer Neg Hx   . Heart attack Neg Hx   . Prostate cancer Neg Hx   . Pancreatic cancer Neg Hx     No Known Allergies  Medication list has been reviewed and updated.  Current Outpatient Medications on File Prior to Visit  Medication Sig Dispense Refill  . acetaminophen (TYLENOL) 500 MG tablet Take 1,000 mg by mouth every 6 (six) hours as needed for mild pain.    [place] Kitchen amiodarone (PACERONE) 200 MG tablet Take 0.5 tablets (100 mg total) by mouth daily. 90 tablet 0  . amoxicillin (AMOXIL) 500 MG capsule     . b complex vitamins tablet Take 1 tablet by mouth daily.    . cycloSPORINE (RESTASIS) 0.05 % ophthalmic emulsion Place 1 drop into both eyes 2 (two) times daily.    . fluticasone (FLONASE) 50 MCG/ACT nasal spray Place 2 sprays into both nostrils daily.   11   . gabapentin (NEURONTIN) 300 MG capsule Take 300 mg by mouth 3 (three) times daily.    . Multiple Vitamin (MULTIVITAMIN WITH MINERALS) TABS tablet Take 1 tablet by mouth every evening.     . Neo-Bacit-Poly-Lidocaine (TRIPLE ANTIBIOTIC MAX ST EX) Apply topically.    . potassium chloride SA (KLOR-CON M20) 20 MEQ tablet Take 2 tablets (40 mEq total) by mouth daily. 180 tablet 3  . rosuvastatin (CRESTOR) 5 MG tablet TAKE 1 TABLET BY MOUTH  DAILY 90 tablet 3  . torsemide (DEMADEX) 20 MG tablet TAKE 2 TABLETS BY MOUTH  DAILY 180 tablet 3  . traZODone (DESYREL) 100 MG tablet Take 200 mg by mouth at bedtime.    [place] Kitchen warfarin (COUMADIN) 5 MG tablet TAKE 1 TABLET BY MOUTH  DAILY AS DIRECTED BY THE  COUMADIN CLINIC 90 tablet 3  . [DISCONTINUED] pantoprazole (PROTONIX) 40 MG tablet Take 1 tablet (40 mg total) by mouth daily before breakfast.     No current facility-administered medications on file prior to visit.    Review of Systems:  As per HPI- otherwise negative.   Physical Examination: Vitals:   01/03/21 1335  BP: 128/80  Pulse: 62  Resp: 17  Temp: 98.5 F (36.9 C)  SpO2: 92%   Vitals:   There is no height or weight on file to calculate BMI. Ideal Body Weight:    GEN: no acute distress.  Appears generally well, alert and oriented HEENT: Atraumatic, Normocephalic.  Ears and Nose: No external deformity. CV: RRR, No M/G/R. No JVD. No thrill. No extra heart sounds. PULM: CTA B, no wheezes, crackles, rhonchi. No retractions. No resp. distress. No accessory muscle use. EXTR: No c/c/e PSYCH: Normally interactive. Conversant.  Tall build.  He is [name] to get to his feet with difficulty and some assistance.  He can stand holding his walker He has stage II sacral decubitus ulcers on both buttocks.  There is significant irritation of the skin likely consistent with diaper dermatitis There is a small stage II pressure ulcer on the left heel  Assessment and Plan: Physical debility  Closed  fracture of right ankle with routine healing, subsequent encounter  Pressure injury of sacral region, stage 2 [facility])  Patient today with a recent change in functional status due to injury.  The patient broke his leg, he is in a tall cam boot and is supposed to be nonweightbearing.  He is also incontinent of urine.  Due to the circumstances, he is rapidly developed a sacral decubitus ulcer and also an ulceration of the left heel.  Discussed with patient and his daughter today.  He is getting some help at home, but he is still left overnight without care and is sitting in a wet diaper during these hours.  His daughter would like him to go for short-term rehabilitation.  The patient has not enthusiastic about this idea, but he does understand this may be the best option.  We hope that rehabilitation stay well prevent worsening of his ulcerations, allow these to heal, and get him back to his normal state of health.  I completed an FL2 form today, I am ready to help in any other way.  The family will look into rehab options  If rehab is delayed or if they decide against this option I will order home health nursing for wound care.  In the meantime we discussed some strategies for dealing with his ulcerations.  I recommended using foam pads under his heel and buttocks, and using a thick barrier cream such as Desitin on the buttock area.  Try to keep diaper as dry as possible.  If he does end up staying home we will get him a hospital bed  This visit occurred during the SARS-CoV-2 public health emergency.  Safety protocols were in place, including screening questions prior to the visit, additional usage of staff PPE, and extensive cleaning of exam room while observing appropriate contact time as indicated for disinfecting solutions.    Signed Lamar Blinks, MD

## 2021-01-03 ENCOUNTER — Encounter: Payer: Self-pay | Admitting: Family Medicine

## 2021-01-03 ENCOUNTER — Telehealth: Payer: Self-pay | Admitting: Family Medicine

## 2021-01-03 ENCOUNTER — Ambulatory Visit: Payer: Medicare Other | Admitting: Family Medicine

## 2021-01-03 ENCOUNTER — Other Ambulatory Visit: Payer: Self-pay

## 2021-01-03 VITALS — BP 128/80 | HR 62 | Temp 98.5°F | Resp 17

## 2021-01-03 DIAGNOSIS — R5381 Other malaise: Secondary | ICD-10-CM | POA: Diagnosis not present

## 2021-01-03 DIAGNOSIS — S82891D Other fracture of right lower leg, subsequent encounter for closed fracture with routine healing: Secondary | ICD-10-CM

## 2021-01-03 DIAGNOSIS — L89152 Pressure ulcer of sacral region, stage 2: Secondary | ICD-10-CM

## 2021-01-03 NOTE — Telephone Encounter (Signed)
Caller Manuela Schwartz  Call Back @ [phone]   Patient seen by Dr Lorelei Pont today, patient's daughter is requesting a home health eval for patient . She  Would like someone to her father  him with daily activities in home.

## 2021-01-03 NOTE — Patient Instructions (Signed)
It was good to see you today- I am sorry that you have this injury. I do agree that a short term rehab stay may be the best way to get you back to your normal life  Please let me know what else I can do to help arrange rehab for you  If you decided not to do this I do think we need to get skilled nursing to come to your home and work on wound care, and we would need a hospital bed for you asap

## 2021-01-04 ENCOUNTER — Other Ambulatory Visit: Payer: Self-pay | Admitting: Physical Medicine and Rehabilitation

## 2021-01-04 ENCOUNTER — Other Ambulatory Visit (HOSPITAL_COMMUNITY): Payer: Self-pay | Admitting: Internal Medicine

## 2021-01-04 NOTE — Telephone Encounter (Signed)
Spoke with daughter and she would like for [name] [name] to send in [facility] to [facility].  Spoke with Soy at the rehab place.  They need [name] [name] to fill out their FL2 form, send medication list, send OV note from yesterday, and insurance info.  Dr. Lorelei Pont and Dr. Charlett Blake is not here today, they will take another provider signature.     Daughter wants paper work done today and sent in as soon as possible.

## 2021-01-04 NOTE — Telephone Encounter (Signed)
Spoke Manuela Schwartz patients daughter and advised her that [facility] form along with notes, xray, med list, and ins card sent over to Soy at [facility].    Soy number: [phone]

## 2021-01-04 NOTE — Telephone Encounter (Signed)
Patient's daughter Manuela Schwartz  Call Back @ [place] Fax Number: [phone]  Patient seen by Dr  Lorelei Pont on yesterday, Patient has bedsores and leg fractures. Patient's daughter is trying to get her father admitted in [facility], The facility will need office notes and Fl2 Form completed. Patient daughter would like office notes faxed to [facility]. She would also like a call back to spoke with a nurse.

## 2021-01-07 NOTE — Telephone Encounter (Signed)
Please advise 

## 2021-01-10 ENCOUNTER — Other Ambulatory Visit: Payer: Self-pay

## 2021-01-10 ENCOUNTER — Ambulatory Visit: Payer: Self-pay

## 2021-01-10 ENCOUNTER — Ambulatory Visit (INDEPENDENT_AMBULATORY_CARE_PROVIDER_SITE_OTHER): Payer: Medicare Other | Admitting: Orthopaedic Surgery

## 2021-01-10 ENCOUNTER — Ambulatory Visit (INDEPENDENT_AMBULATORY_CARE_PROVIDER_SITE_OTHER): Payer: Medicare Other

## 2021-01-10 ENCOUNTER — Encounter: Payer: Self-pay | Admitting: Orthopaedic Surgery

## 2021-01-10 VITALS — Ht 77.0 in | Wt 173.0 lb

## 2021-01-10 DIAGNOSIS — M79671 Pain in right foot: Secondary | ICD-10-CM

## 2021-01-10 DIAGNOSIS — M25571 Pain in right ankle and joints of right foot: Secondary | ICD-10-CM | POA: Diagnosis not present

## 2021-01-10 DIAGNOSIS — S92321A Displaced fracture of second metatarsal bone, right foot, initial encounter for closed fracture: Secondary | ICD-10-CM

## 2021-01-10 DIAGNOSIS — S82841D Displaced bimalleolar fracture of right lower leg, subsequent encounter for closed fracture with routine healing: Secondary | ICD-10-CM

## 2021-01-10 DIAGNOSIS — S82841A Displaced bimalleolar fracture of right lower leg, initial encounter for closed fracture: Secondary | ICD-10-CM | POA: Insufficient documentation

## 2021-01-10 NOTE — Progress Notes (Signed)
Office Visit Note   Patient: [name] [name]           Date of Birth: May 18, 1931           MRN: [id number] Visit Date: 01/10/2021              Requested by: [name], MD [address] [place],  [place] [address] PCP: [name], MD   Assessment & Plan: Visit Diagnoses:  1. Closed displaced fracture of second metatarsal bone of right foot, initial encounter   2. Pain in right ankle and joints of right foot   3. Pain in right foot   4. Closed bimalleolar fracture of right ankle with routine healing, subsequent encounter     Plan: Just over 3 weeks status post injury to the right foot as previously outlined. There are nondisplaced fractures of the medial lateral malleolus with an intact ankle mortise. There is also a fracture of the base of the fifth metatarsal. Continues to be nonweightbearing in the equalizer boot. Continue with the same and reevaluate in 3 weeks with repeat films  Follow-Up Instructions: Return in about 3 weeks (around 01/31/2021).   Orders:  Orders Placed This Encounter  Procedures  . XR Foot Complete Right  . XR Ankle Complete Right   No orders of the defined types were placed in this encounter.     Procedures: No procedures performed   Clinical Data: No additional findings.   Subjective: Chief Complaint  Patient presents with  . Right Ankle - Follow-up    Right lateral malleolus fracture  . Right Foot - Follow-up    Right fifth metatarsal fracture  Patient presents today for follow up on his right ankle and foot. He has a right fifth metatarsal fracture and lateral malleolus fracture. He is now 3 weeks out from injury. He has been wearing his boot. Patient states that he has no pain. [name] is accompanied by her son who relates he is now in a rehab facility as he is developed pressure ulcers in his buttock and both heels. He is receiving some physical therapy for his lower extremities but maintains nonweightbearing to  the right lower extremity. He is not having much pain  HPI  Review of Systems   Objective: Vital Signs: Ht 6\' 5"  (1.956 m)   Wt 173 lb (78.5 kg)   BMI 20.51 kg/m   Physical Exam Constitutional:      Appearance: He is well-developed and well-nourished.  HENT:     Mouth/Throat:     Mouth: Oropharynx is clear and moist.  Eyes:     Extraocular Movements: EOM normal.     Pupils: Pupils are equal, round, and reactive to light.  Pulmonary:     Effort: Pulmonary effort is normal.  Skin:    General: Skin is warm and dry.  Neurological:     Mental Status: He is alert and oriented to person, place, and time.  Psychiatric:        Mood and Affect: Mood and affect normal.        Behavior: Behavior normal.     Ortho Exam awake alert and oriented. Evaluated in a wheelchair. I did not remove the equalizer boot as he does have a dressing on both heel ulcers. X-rays demonstrated no change in the nondisplaced fractures. He is able to move his toes and had good sensation of the tip of his toes  Specialty Comments:  No specialty comments available.  Imaging: XR Ankle Complete Right  Result Date: 01/10/2021 Films of the right ankle obtained in several projections. There is a vertical fracture of the medial malleolus without displacement and unchanged from prior films. There is also a transverse fracture near the tip of the lateral malleolus that is also unchanged and nondisplaced from prior films. Ankle mortise intact  XR Foot Complete Right  Result Date: 01/10/2021 Films of the right foot with pain in several projections and compared to films previously performed there is a nondisplaced fracture at the base of the fifth metatarsal unchanged from prior films.    PMFS History: Patient Active Problem List   Diagnosis Date Noted  . Bimalleolar fracture of right ankle 01/10/2021  . Pain in right foot 12/25/2020  . Pain in right ankle and joints of right foot 12/25/2020  . Hyperglycemia  08/14/2020  . High serum vitamin B12 08/14/2020  . Anemia 03/11/2020  . Spinal stenosis of lumbar region with neurogenic claudication 01/11/2020  . Breast mass in male 09/29/2019  . Hypoxia 04/01/2019  . Knee pain, bilateral 08/08/2018  . Mass of right hand 04/12/2018  . Chronic renal insufficiency 10/06/2017  . Spondylosis without myelopathy or radiculopathy, lumbar region 09/16/2017  . Hypokalemia 04/30/2017  . PAH (pulmonary artery hypertension) ([place])   . Right-sided heart failure ([place]) 04/26/2017  . Chronic diastolic heart failure ([place]) 04/21/2017  . Pulmonary hypertension, primary ([place]) 04/21/2017  . Severe tricuspid regurgitation 03/12/2017  . Bilateral lower extremity edema 02/25/2017  . Anticoagulated 02/25/2017  . Varicose veins of right lower extremity with complications 54/65/6812  . Obstructive lung disease (generalized) ([place]) 09/26/2016  . Carpal tunnel syndrome 06/25/2016  . Hereditary and idiopathic peripheral neuropathy 06/25/2016  . Paresthesia 05/27/2016  . Abnormality of gait 05/27/2016  . Constipation 06/13/2015  . Encounter for therapeutic drug monitoring 05/25/2014  . Syncope 04/03/2014  . Low back pain 04/19/2013  . Melanoma ([place]) 09/30/2012  . Cough 03/26/2012  . Hx of mitral valve repair 11/19/2011  . Long term (current) use of anticoagulants 11/03/2011  . Pleural effusion due to congestive heart failure ([place]) 10/28/2011  . Atrial fibrillation ([place]) 10/07/2011  . S/P mitral valve repair 10/01/2011  . Valvular heart disease 08/21/2011  . Hearing loss 04/09/2011  . Thrombocytopenia ([place]) 09/19/2010  . ADENOCARCINOMA, PROSTATE 09/17/2010  . CALLUS, LEFT FOOT 09/17/2010  . Backache 09/17/2010  . TINEA PEDIS 05/28/2009  . DERMATITIS, ATOPIC 04/10/2009  . HYPERCHOLESTEROLEMIA 06/09/2008  . DEPRESSION 06/09/2008  . HYPERTENSION, BENIGN ESSENTIAL 06/09/2008  . GERD 06/09/2008  . OSTEOARTHRITIS, GENERALIZED, MULTIPLE JOINTS 06/09/2008  . MUSCLE SPASM,  BACK 06/09/2008  . H/O prostate cancer 06/09/2008  . PERSONAL HISTORY OF MALIGNANT MELANOMA OF SKIN 06/09/2008  . ARRHYTHMIA, HX OF 06/09/2008  . Personal history of colonic polyps 06/09/2008   Past Medical History:  Diagnosis Date  . Abnormality of gait 05/27/2016  . Adenomatous polyps   . Carpal tunnel syndrome 06/25/2016   Right  . Depressive disorder, not elsewhere classified   . First degree AV block    Holter 3/18: NSR, PACs, PVCs, no AFib, no pauses.  Marland Kitchen Hereditary and idiopathic peripheral neuropathy 06/25/2016  . Hypertension   . Internal nasal lesion 05/15/2013  . Melanoma ([place])    Left Shoulder  . Mitral regurgitation   . MVP (mitral valve prolapse)    a. With severe MR s/p Complex valvuloplasty including artificial Gore-tex neochord placement x4, chordal transposition x1, chordal release x1, # 32 mm Sorin Memo 3D Ring Annuloplasty 2012. //  b. Echo 2/18: mild LVH, EF 50-55, mild AI, MV repair with mild MR, mod LAE, mod RVE, severe RAE, severe TR  . Neuropathy   . Normal coronary arteries    a. Normal coronary anatomy by cath 2012.  . Osteoarthritis    Knees  . PAF (paroxysmal atrial fibrillation) ([place])    a. Post-op MVR 2012.  Marland Kitchen Personal history of colonic polyps   . Prostate cancer ([place])   . Pulmonary HTN ([place])    a. Mild-mod by cath 2012.  . Pure hypercholesterolemia   . PVC (premature ventricular contraction)   . Thrombocytopenia ([place])   . Vision abnormalities    Cornea scarring    Family History  Problem Relation Age of Onset  . Clotting disorder Brother        CVA's  . Arthritis Mother   . Hypertension Mother   . Stroke Mother   . Hypertension Father   . Psychosis Father        psychiatric care  . Colon cancer Neg Hx   . Stomach cancer Neg Hx   . Heart attack Neg Hx   . Prostate cancer Neg Hx   . Pancreatic cancer Neg Hx     Past Surgical History:  Procedure Laterality Date  . CARDIAC CATHETERIZATION  09/2011   Pre-op for MVR -- normal coronaries.   Marland Kitchen CARDIOVERSION N/A 01/02/2016   Procedure: CARDIOVERSION;  Surgeon: Thayer Headings, MD;  Location: [facility] ENDOSCOPY;  Service: Cardiovascular;  Laterality: N/A;  . COLONOSCOPY W/ POLYPECTOMY    . INGUINAL HERNIA REPAIR  09/2009   Left  . KNEE ARTHROSCOPY      left x3  and right x2  . Melanoma Surgery     2001, 2005, 2006, 2009  . MITRAL VALVE REPAIR  10/01/2011   complex valvuloplasty with Goretex cord replacement and chordal transposition 79mm Sorin Memo 3D ring annuloplasty  . Nuclear Stress Test  09/2006   EF-64%, Normal  . PROSTATECTOMY  1993  . RIGHT HEART CATH N/A 04/29/2017   Procedure: Right Heart Cath;  Surgeon: Jolaine Artist, MD;  Location: [facility] CV LAB;  Service: Cardiovascular;  Laterality: N/A;  . ROOT CANAL  08-19-12  . ROTATOR CUFF REPAIR  2003   left  . TEE WITHOUT CARDIOVERSION  09/26/2011   Procedure: TRANSESOPHAGEAL ECHOCARDIOGRAM (TEE);  Surgeon: Lelon Perla, MD;  Location: [facility] ENDOSCOPY;  Service: Cardiovascular;  Laterality: N/A;  . US ECHOCARDIOGRAPHY  09/2009, 08/1011   mild LVH,mild AI,MVP with mild MR, mild-mod. TR with mild Pulm. HTN, EF-55-60%   Social History   Occupational History    Comment: retired  Tobacco Use  . Smoking status: Never Smoker  . Smokeless tobacco: Never Used  Vaping Use  . Vaping Use: Never used  Substance and Sexual Activity  . Alcohol use: No    Alcohol/week: 0.0 standard drinks    Comment: Last drink in 2000  . Drug use: No  . Sexual activity: Not Currently

## 2021-01-16 ENCOUNTER — Telehealth: Payer: Self-pay | Admitting: *Deleted

## 2021-01-16 NOTE — Telephone Encounter (Signed)
Pt called and stated he was in a nursing home to due to needing assistance after a fracture. He states he needed to know what dose of Warfarin he normally takes cause he thinks they are overdosing him with warfarin. Advised he was using a 5mg  tablet and taking 5mg  on Monday and Friday and 2.5mg  (1/2 tablet) all other days. He said his INR was 8.0 and they have to know his dose that we were giving him so he was going to tell them so they can get his number down. Also, He will let us know once he is discharged so we can resume care.

## 2021-01-31 ENCOUNTER — Ambulatory Visit (INDEPENDENT_AMBULATORY_CARE_PROVIDER_SITE_OTHER): Payer: Medicare Other | Admitting: Orthopaedic Surgery

## 2021-01-31 ENCOUNTER — Ambulatory Visit (INDEPENDENT_AMBULATORY_CARE_PROVIDER_SITE_OTHER): Payer: Medicare Other

## 2021-01-31 ENCOUNTER — Encounter: Payer: Self-pay | Admitting: Orthopaedic Surgery

## 2021-01-31 ENCOUNTER — Other Ambulatory Visit: Payer: Self-pay

## 2021-01-31 ENCOUNTER — Ambulatory Visit: Payer: Self-pay

## 2021-01-31 VITALS — Ht 77.0 in | Wt 173.0 lb

## 2021-01-31 DIAGNOSIS — S92321A Displaced fracture of second metatarsal bone, right foot, initial encounter for closed fracture: Secondary | ICD-10-CM

## 2021-01-31 DIAGNOSIS — M25571 Pain in right ankle and joints of right foot: Secondary | ICD-10-CM

## 2021-01-31 DIAGNOSIS — S82841D Displaced bimalleolar fracture of right lower leg, subsequent encounter for closed fracture with routine healing: Secondary | ICD-10-CM

## 2021-01-31 NOTE — Progress Notes (Signed)
Office Visit Note   Patient: [name] [name]           Date of Birth: 1930/11/18           MRN: [id number] Visit Date: 01/31/2021              Requested by: [name], MD [address] [place],  [place] [address] PCP: [name], MD   Assessment & Plan: Visit Diagnoses:  1. Closed displaced fracture of second metatarsal bone of right foot, initial encounter   2. Pain in right ankle and joints of right foot   3. Closed bimalleolar fracture of right ankle with routine healing, subsequent encounter     Plan: Approximately 7 weeks status post fractures as outlined above including a nondisplaced transverse fracture at the tip of the lateral right malleolus and a vertical fracture of the medial malleolus of the same ankle.  There is good callus on the medial malleolus fracture and ankle mortise is intact.  No areas of tenderness.  In addition he had a nondisplaced yet comminuted fracture at the base of the fifth metatarsal and an old fracture of the second metatarsal right foot.  Fractures are healing without problem.  At this point I think is fine to bear weight with his walker which he uses all the time because of his balance and the equalizer boot.  He continues to live at the nursing facility.  I like to see him back in 2 weeks.  I suspect at that point we might be able to let him walk without the boot.  Apparently all of his skin ulcers have healed.  Office 2 weeks.  X-rays would not be necessary unless there is some change  Follow-Up Instructions: Return in about 2 weeks (around 02/14/2021).   Orders:  Orders Placed This Encounter  Procedures  . XR Foot Complete Right  . XR Ankle Complete Right   No orders of the defined types were placed in this encounter.     Procedures: No procedures performed   Clinical Data: No additional findings.   Subjective: Chief Complaint  Patient presents with  . Right Ankle - Pain  . Right Foot - Pain  Patient  presents today for follow up on his right ankle and foot fractures. He is now 6 weeks out from injury. Patient states that he has no pain. He has been wearing his tall boot.  Accompanied by his son.  Still at the nursing facility.  Not having any pain  HPI  Review of Systems   Objective: Vital Signs: Ht 6\' 5"  (1.956 m)   Wt 173 lb (78.5 kg)   BMI 20.51 kg/m   Physical Exam  Ortho Exam no localized areas of tenderness about the medial lateral aspect of his right ankle.  Has significant venous stasis changes.  The foot was warm.  No pain along the base of the fifth metatarsal or over the second metatarsal  Specialty Comments:  No specialty comments available.  Imaging: XR Ankle Complete Right  Result Date: 01/31/2021 Films of the right ankle obtained in several projections.  The vertical fracture of the medial malleolus is in excellent position with callus.  Ankle mortise intact.  The transverse fracture at the very distal fibula also was in good position and might have some early callus.  There were no areas of pain  XR Foot Complete Right  Result Date: 01/31/2021 Films of the right foot were obtained in several  projections.  There is diffuse osteopenia.  The the comminuted fracture of the base of the fifth metatarsal is in good position and might have some early callus.  There is an old fracture of the second metatarsal with abundant callus    PMFS History: Patient Active Problem List   Diagnosis Date Noted  . Bimalleolar fracture of right ankle 01/10/2021  . Pain in right foot 12/25/2020  . Pain in right ankle and joints of right foot 12/25/2020  . Hyperglycemia 08/14/2020  . High serum vitamin B12 08/14/2020  . Anemia 03/11/2020  . Spinal stenosis of lumbar region with neurogenic claudication 01/11/2020  . Breast mass in male 09/29/2019  . Hypoxia 04/01/2019  . Knee pain, bilateral 08/08/2018  . Mass of right hand 04/12/2018  . Chronic renal insufficiency 10/06/2017  .  Spondylosis without myelopathy or radiculopathy, lumbar region 09/16/2017  . Hypokalemia 04/30/2017  . PAH (pulmonary artery hypertension) ([place])   . Right-sided heart failure ([place]) 04/26/2017  . Chronic diastolic heart failure ([place]) 04/21/2017  . Pulmonary hypertension, primary ([place]) 04/21/2017  . Severe tricuspid regurgitation 03/12/2017  . Bilateral lower extremity edema 02/25/2017  . Anticoagulated 02/25/2017  . Varicose veins of right lower extremity with complications 09/10/8526  . Obstructive lung disease (generalized) ([place]) 09/26/2016  . Carpal tunnel syndrome 06/25/2016  . Hereditary and idiopathic peripheral neuropathy 06/25/2016  . Paresthesia 05/27/2016  . Abnormality of gait 05/27/2016  . Constipation 06/13/2015  . Encounter for therapeutic drug monitoring 05/25/2014  . Syncope 04/03/2014  . Low back pain 04/19/2013  . Melanoma ([place]) 09/30/2012  . Cough 03/26/2012  . Hx of mitral valve repair 11/19/2011  . Long term (current) use of anticoagulants 11/03/2011  . Pleural effusion due to congestive heart failure ([place]) 10/28/2011  . Atrial fibrillation ([place]) 10/07/2011  . S/P mitral valve repair 10/01/2011  . Valvular heart disease 08/21/2011  . Hearing loss 04/09/2011  . Thrombocytopenia ([place]) 09/19/2010  . ADENOCARCINOMA, PROSTATE 09/17/2010  . CALLUS, LEFT FOOT 09/17/2010  . Backache 09/17/2010  . TINEA PEDIS 05/28/2009  . DERMATITIS, ATOPIC 04/10/2009  . HYPERCHOLESTEROLEMIA 06/09/2008  . DEPRESSION 06/09/2008  . HYPERTENSION, BENIGN ESSENTIAL 06/09/2008  . GERD 06/09/2008  . OSTEOARTHRITIS, GENERALIZED, MULTIPLE JOINTS 06/09/2008  . MUSCLE SPASM, BACK 06/09/2008  . H/O prostate cancer 06/09/2008  . PERSONAL HISTORY OF MALIGNANT MELANOMA OF SKIN 06/09/2008  . ARRHYTHMIA, HX OF 06/09/2008  . Personal history of colonic polyps 06/09/2008   Past Medical History:  Diagnosis Date  . Abnormality of gait 05/27/2016  . Adenomatous polyps   . Carpal tunnel syndrome  06/25/2016   Right  . Depressive disorder, not elsewhere classified   . First degree AV block    Holter 3/18: NSR, PACs, PVCs, no AFib, no pauses.  Marland Kitchen Hereditary and idiopathic peripheral neuropathy 06/25/2016  . Hypertension   . Internal nasal lesion 05/15/2013  . Melanoma ([place])    Left Shoulder  . Mitral regurgitation   . MVP (mitral valve prolapse)    a. With severe MR s/p Complex valvuloplasty including artificial Gore-tex neochord placement x4, chordal transposition x1, chordal release x1, # 32 mm Sorin Memo 3D Ring Annuloplasty 2012. // b. Echo 2/18: mild LVH, EF 50-55, mild AI, MV repair with mild MR, mod LAE, mod RVE, severe RAE, severe TR  . Neuropathy   . Normal coronary arteries    a. Normal coronary anatomy by cath 2012.  . Osteoarthritis    Knees  . PAF (paroxysmal atrial fibrillation) ([place])  a. Post-op MVR 2012.  Marland Kitchen Personal history of colonic polyps   . Prostate cancer ([place])   . Pulmonary HTN ([place])    a. Mild-mod by cath 2012.  . Pure hypercholesterolemia   . PVC (premature ventricular contraction)   . Thrombocytopenia ([place])   . Vision abnormalities    Cornea scarring    Family History  Problem Relation Age of Onset  . Clotting disorder Brother        CVA's  . Arthritis Mother   . Hypertension Mother   . Stroke Mother   . Hypertension Father   . Psychosis Father        psychiatric care  . Colon cancer Neg Hx   . Stomach cancer Neg Hx   . Heart attack Neg Hx   . Prostate cancer Neg Hx   . Pancreatic cancer Neg Hx     Past Surgical History:  Procedure Laterality Date  . CARDIAC CATHETERIZATION  09/2011   Pre-op for MVR -- normal coronaries.  Marland Kitchen CARDIOVERSION N/A 01/02/2016   Procedure: CARDIOVERSION;  Surgeon: Thayer Headings, MD;  Location: [facility] ENDOSCOPY;  Service: Cardiovascular;  Laterality: N/A;  . COLONOSCOPY W/ POLYPECTOMY    . INGUINAL HERNIA REPAIR  09/2009   Left  . KNEE ARTHROSCOPY      left x3  and right x2  . Melanoma Surgery     2001, 2005,  2006, 2009  . MITRAL VALVE REPAIR  10/01/2011   complex valvuloplasty with Goretex cord replacement and chordal transposition 5mm Sorin Memo 3D ring annuloplasty  . Nuclear Stress Test  09/2006   EF-64%, Normal  . PROSTATECTOMY  1993  . RIGHT HEART CATH N/A 04/29/2017   Procedure: Right Heart Cath;  Surgeon: Jolaine Artist, MD;  Location: [facility] CV LAB;  Service: Cardiovascular;  Laterality: N/A;  . ROOT CANAL  08-19-12  . ROTATOR CUFF REPAIR  2003   left  . TEE WITHOUT CARDIOVERSION  09/26/2011   Procedure: TRANSESOPHAGEAL ECHOCARDIOGRAM (TEE);  Surgeon: Lelon Perla, MD;  Location: [facility] ENDOSCOPY;  Service: Cardiovascular;  Laterality: N/A;  . US ECHOCARDIOGRAPHY  09/2009, 08/1011   mild LVH,mild AI,MVP with mild MR, mild-mod. TR with mild Pulm. HTN, EF-55-60%   Social History   Occupational History    Comment: retired  Tobacco Use  . Smoking status: Never Smoker  . Smokeless tobacco: Never Used  Vaping Use  . Vaping Use: Never used  Substance and Sexual Activity  . Alcohol use: No    Alcohol/week: 0.0 standard drinks    Comment: Last drink in 2000  . Drug use: No  . Sexual activity: Not Currently

## 2021-02-14 ENCOUNTER — Other Ambulatory Visit: Payer: Self-pay

## 2021-02-14 ENCOUNTER — Ambulatory Visit (INDEPENDENT_AMBULATORY_CARE_PROVIDER_SITE_OTHER): Payer: Medicare Other | Admitting: Orthopaedic Surgery

## 2021-02-14 ENCOUNTER — Encounter: Payer: Self-pay | Admitting: Orthopaedic Surgery

## 2021-02-14 VITALS — Ht 77.0 in | Wt 173.0 lb

## 2021-02-14 DIAGNOSIS — S82841D Displaced bimalleolar fracture of right lower leg, subsequent encounter for closed fracture with routine healing: Secondary | ICD-10-CM

## 2021-02-14 NOTE — Progress Notes (Signed)
Office Visit Note   Patient: [name] [name]           Date of Birth: [birth year]           MRN: [id number] Visit Date: 02/14/2021              Requested by: [name], MD [address] [place],  [place] [id number] PCP: [name], MD   Assessment & Plan: Visit Diagnoses:  1. Closed bimalleolar fracture of right ankle with routine healing, subsequent encounter     Plan: Approximately 9 weeks status post fractures of the right ankle and foot as previously outlined.  Doing well.  Presently still residing at the rehab facility but loves to go home with help.  From my standpoint he is doing well.  There is no pain about the right foot or ankle.  I think his fractures are healing nicely.  I think he is fine to continue walking with weightbearing as tolerated right lower extremity using the walker that he was using even before the fractures.  I think the issue regarding is going home needs to be discussed with the therapists and family.  I will be happy to see him back as needed but would like him to call if he has any issues  Follow-Up Instructions: Return if symptoms worsen or fail to improve.   Orders:  No orders of the defined types were placed in this encounter.  No orders of the defined types were placed in this encounter.     Procedures: No procedures performed   Clinical Data: No additional findings.   Subjective: Chief Complaint  Patient presents with  . Right Ankle - Follow-up  . Right Foot - Follow-up  Patient presents today for a two week follow up on his right foot and ankle fractures. He is now 9 weeks out from the injury. Patient states that he is doing well. He has been walking in his boot at home.   HPI  Review of Systems   Objective: Vital Signs: Ht 6\' 5"  (1.956 m)   Wt 173 lb (78.5 kg)   BMI 20.51 kg/m   Physical Exam  Ortho Exam no obvious swelling of either lower extremity.  No tenderness over the medial or lateral  malleolus right ankle.  No pain about the base of the fifth metatarsal or second metatarsal right foot where he had fractures.  No obvious deformity.  Able to move his toes.  Specialty Comments:  No specialty comments available.  Imaging: No results found.   PMFS History: Patient Active Problem List   Diagnosis Date Noted  . Bimalleolar fracture of right ankle 01/10/2021  . Pain in right foot 12/25/2020  . Pain in right ankle and joints of right foot 12/25/2020  . Hyperglycemia 08/14/2020  . High serum vitamin B12 08/14/2020  . Anemia 03/11/2020  . Spinal stenosis of lumbar region with neurogenic claudication 01/11/2020  . Breast mass in male 09/29/2019  . Hypoxia 04/01/2019  . Knee pain, bilateral 08/08/2018  . Mass of right hand 04/12/2018  . Chronic renal insufficiency 10/06/2017  . Spondylosis without myelopathy or radiculopathy, lumbar region 09/16/2017  . Hypokalemia 04/30/2017  . PAH (pulmonary artery hypertension) ([place])   . Right-sided heart failure ([place]) 04/26/2017  . Chronic diastolic heart failure ([place]) 04/21/2017  . Pulmonary hypertension, primary ([place]) 04/21/2017  . Severe tricuspid regurgitation 03/12/2017  . Bilateral lower extremity edema 02/25/2017  . Anticoagulated 02/25/2017  . Varicose veins  of right lower extremity with complications 51/12/5850  . Obstructive lung disease (generalized) ([place]) 09/26/2016  . Carpal tunnel syndrome 06/25/2016  . Hereditary and idiopathic peripheral neuropathy 06/25/2016  . Paresthesia 05/27/2016  . Abnormality of gait 05/27/2016  . Constipation 06/13/2015  . Encounter for therapeutic drug monitoring 05/25/2014  . Syncope 04/03/2014  . Low back pain 04/19/2013  . Melanoma ([place]) 09/30/2012  . Cough 03/26/2012  . Hx of mitral valve repair 11/19/2011  . Long term (current) use of anticoagulants 11/03/2011  . Pleural effusion due to congestive heart failure ([place]) 10/28/2011  . Atrial fibrillation ([place]) 10/07/2011  . S/P  mitral valve repair 10/01/2011  . Valvular heart disease 08/21/2011  . Hearing loss 04/09/2011  . Thrombocytopenia ([place]) 09/19/2010  . ADENOCARCINOMA, PROSTATE 09/17/2010  . CALLUS, LEFT FOOT 09/17/2010  . Backache 09/17/2010  . TINEA PEDIS 05/28/2009  . DERMATITIS, ATOPIC 04/10/2009  . HYPERCHOLESTEROLEMIA 06/09/2008  . DEPRESSION 06/09/2008  . HYPERTENSION, BENIGN ESSENTIAL 06/09/2008  . GERD 06/09/2008  . OSTEOARTHRITIS, GENERALIZED, MULTIPLE JOINTS 06/09/2008  . MUSCLE SPASM, BACK 06/09/2008  . H/O prostate cancer 06/09/2008  . PERSONAL HISTORY OF MALIGNANT MELANOMA OF SKIN 06/09/2008  . ARRHYTHMIA, HX OF 06/09/2008  . Personal history of colonic polyps 06/09/2008   Past Medical History:  Diagnosis Date  . Abnormality of gait 05/27/2016  . Adenomatous polyps   . Carpal tunnel syndrome 06/25/2016   Right  . Depressive disorder, not elsewhere classified   . First degree AV block    Holter 3/18: NSR, PACs, PVCs, no AFib, no pauses.  Marland Kitchen Hereditary and idiopathic peripheral neuropathy 06/25/2016  . Hypertension   . Internal nasal lesion 05/15/2013  . Melanoma ([place])    Left Shoulder  . Mitral regurgitation   . MVP (mitral valve prolapse)    a. With severe MR s/p Complex valvuloplasty including artificial Gore-tex neochord placement x4, chordal transposition x1, chordal release x1, # 32 mm Sorin Memo 3D Ring Annuloplasty 2012. // b. Echo 2/18: mild LVH, EF 50-55, mild AI, MV repair with mild MR, mod LAE, mod RVE, severe RAE, severe TR  . Neuropathy   . Normal coronary arteries    a. Normal coronary anatomy by cath 2012.  . Osteoarthritis    Knees  . PAF (paroxysmal atrial fibrillation) ([place])    a. Post-op MVR 2012.  Marland Kitchen Personal history of colonic polyps   . Prostate cancer ([place])   . Pulmonary HTN ([place])    a. Mild-mod by cath 2012.  . Pure hypercholesterolemia   . PVC (premature ventricular contraction)   . Thrombocytopenia ([place])   . Vision abnormalities    Cornea scarring     Family History  Problem Relation Age of Onset  . Clotting disorder Brother        CVA's  . Arthritis Mother   . Hypertension Mother   . Stroke Mother   . Hypertension Father   . Psychosis Father        psychiatric care  . Colon cancer Neg Hx   . Stomach cancer Neg Hx   . Heart attack Neg Hx   . Prostate cancer Neg Hx   . Pancreatic cancer Neg Hx     Past Surgical History:  Procedure Laterality Date  . CARDIAC CATHETERIZATION  09/2011   Pre-op for MVR -- normal coronaries.  Marland Kitchen CARDIOVERSION N/A 01/02/2016   Procedure: CARDIOVERSION;  Surgeon: Thayer Headings, MD;  Location: [facility] ENDOSCOPY;  Service: Cardiovascular;  Laterality: N/A;  . COLONOSCOPY W/ POLYPECTOMY    .  INGUINAL HERNIA REPAIR  09/2009   Left  . KNEE ARTHROSCOPY      left x3  and right x2  . Melanoma Surgery     2001, 2005, 2006, 2009  . MITRAL VALVE REPAIR  10/01/2011   complex valvuloplasty with Goretex cord replacement and chordal transposition 14mm Sorin Memo 3D ring annuloplasty  . Nuclear Stress Test  09/2006   EF-64%, Normal  . PROSTATECTOMY  1993  . RIGHT HEART CATH N/A 04/29/2017   Procedure: Right Heart Cath;  Surgeon: Jolaine Artist, MD;  Location: [facility] CV LAB;  Service: Cardiovascular;  Laterality: N/A;  . ROOT CANAL  08-19-12  . ROTATOR CUFF REPAIR  2003   left  . TEE WITHOUT CARDIOVERSION  09/26/2011   Procedure: TRANSESOPHAGEAL ECHOCARDIOGRAM (TEE);  Surgeon: Lelon Perla, MD;  Location: [facility] ENDOSCOPY;  Service: Cardiovascular;  Laterality: N/A;  . US ECHOCARDIOGRAPHY  09/2009, 08/1011   mild LVH,mild AI,MVP with mild MR, mild-mod. TR with mild Pulm. HTN, EF-55-60%   Social History   Occupational History    Comment: retired  Tobacco Use  . Smoking status: Never Smoker  . Smokeless tobacco: Never Used  Vaping Use  . Vaping Use: Never used  Substance and Sexual Activity  . Alcohol use: No    Alcohol/week: 0.0 standard drinks    Comment: Last drink in 2000  . Drug use: No  .  Sexual activity: Not Currently

## 2021-02-18 ENCOUNTER — Telehealth: Payer: Self-pay | Admitting: Family Medicine

## 2021-02-18 NOTE — Telephone Encounter (Signed)
Ronalee Belts from [employer]  # [phone]  Ronalee Belts would like to know if Dr. Charlett Blake would sign for follow up on home health care service

## 2021-02-20 ENCOUNTER — Telehealth: Payer: Self-pay | Admitting: Family Medicine

## 2021-02-20 NOTE — Telephone Encounter (Signed)
Caller : [name]  Call Back @ [phone]  Patient just d/c from skilled nursing home, patient needs wound care , Lenna Sciara is requesting verbal order for pressure ulcers(wounds)   Please advise

## 2021-02-20 NOTE — Telephone Encounter (Signed)
Verbal for wound care

## 2021-02-21 ENCOUNTER — Ambulatory Visit (INDEPENDENT_AMBULATORY_CARE_PROVIDER_SITE_OTHER): Payer: Medicare Other

## 2021-02-21 ENCOUNTER — Other Ambulatory Visit: Payer: Self-pay

## 2021-02-21 DIAGNOSIS — I4891 Unspecified atrial fibrillation: Secondary | ICD-10-CM

## 2021-02-21 DIAGNOSIS — Z5181 Encounter for therapeutic drug level monitoring: Secondary | ICD-10-CM

## 2021-02-21 LAB — POCT INR: INR: 2.8 (ref 2.0–3.0)

## 2021-02-21 NOTE — Patient Instructions (Addendum)
Description   Continue on same dosage 1/2 tablet daily except 1 tablet on Mondays and Fridays.  Recheck INR in 1 week by [facility] [phone]). Written order faxed to [phone] to check INR with POC machine and call results to clinic while in pt's home. Call [phone] if scheduled for any procedures or on any new medications

## 2021-02-28 ENCOUNTER — Ambulatory Visit (INDEPENDENT_AMBULATORY_CARE_PROVIDER_SITE_OTHER): Payer: Medicare Other | Admitting: Pharmacist

## 2021-02-28 DIAGNOSIS — Z5181 Encounter for therapeutic drug level monitoring: Secondary | ICD-10-CM | POA: Diagnosis not present

## 2021-02-28 DIAGNOSIS — I4891 Unspecified atrial fibrillation: Secondary | ICD-10-CM | POA: Diagnosis not present

## 2021-02-28 LAB — POCT INR: INR: 2.4 (ref 2.0–3.0)

## 2021-02-28 NOTE — Patient Instructions (Signed)
Description   Spoke with Randell Patient, RN with [facility] while in home with pt and advised pt to continue on same dosage 1/2 tablet daily except 1 tablet on Mondays and Fridays.  Recheck INR in 2 weeks by [facility] [phone]). Written order faxed to [phone] to check INR with POC machine and call results to clinic while in pt's home. Call [phone] if scheduled for any procedures or on any new medications

## 2021-03-06 ENCOUNTER — Telehealth (HOSPITAL_COMMUNITY): Payer: Self-pay | Admitting: *Deleted

## 2021-03-06 DIAGNOSIS — M7989 Other specified soft tissue disorders: Secondary | ICD-10-CM

## 2021-03-06 DIAGNOSIS — I5032 Chronic diastolic (congestive) heart failure: Secondary | ICD-10-CM

## 2021-03-06 NOTE — Telephone Encounter (Signed)
Pt called c/o of swelling in right calf. Pt said in the morning calf is swollen and soft but by the end of the day it is still swollen but hard. Pt said swelling started 4-5 days ago. Pt denies pain, pt said it is not "hot/warm to the touch", no discoloration. Pt said he did not injure his leg either. Pt said this has happened in the past and his leg began weeping blood and he is trying to avoid this situation. Pt asked that I forward all information directly to Dr.Bensimhon.  Routed to [place] for advice

## 2021-03-07 NOTE — Telephone Encounter (Signed)
No swelling in left leg. Pt has not seen his pcp. Will order lower extremity u/s.

## 2021-03-07 NOTE — Telephone Encounter (Signed)
Left VM for pt to return call.

## 2021-03-07 NOTE — Telephone Encounter (Signed)
Pt reports other leg is not swollen, patient does not check weight daily. Patient has not seen PCP.  Aware to follow up with PCP Pt reports he will also wear ted hose to assist with swelling Denies increased SOB, cough,ab swelling, increased fatigue, recent medication changes and or dietary changes.   Advised we will placed order for u/s if negative patient is more than welcome to schedule follow up with Dr Haroldine Laws and or PCP

## 2021-03-08 ENCOUNTER — Emergency Department (HOSPITAL_BASED_OUTPATIENT_CLINIC_OR_DEPARTMENT_OTHER): Payer: Medicare Other

## 2021-03-08 ENCOUNTER — Encounter (HOSPITAL_BASED_OUTPATIENT_CLINIC_OR_DEPARTMENT_OTHER): Payer: Self-pay

## 2021-03-08 ENCOUNTER — Emergency Department (HOSPITAL_BASED_OUTPATIENT_CLINIC_OR_DEPARTMENT_OTHER)
Admission: EM | Admit: 2021-03-08 | Discharge: 2021-03-08 | Disposition: A | Discharge status: Home / Self Care | Payer: Medicare Other | Attending: Emergency Medicine | Admitting: Emergency Medicine

## 2021-03-08 ENCOUNTER — Other Ambulatory Visit: Payer: Self-pay

## 2021-03-08 DIAGNOSIS — R600 Localized edema: Secondary | ICD-10-CM

## 2021-03-08 DIAGNOSIS — Z7901 Long term (current) use of anticoagulants: Secondary | ICD-10-CM | POA: Diagnosis not present

## 2021-03-08 DIAGNOSIS — Z8546 Personal history of malignant neoplasm of prostate: Secondary | ICD-10-CM | POA: Diagnosis not present

## 2021-03-08 DIAGNOSIS — I5032 Chronic diastolic (congestive) heart failure: Secondary | ICD-10-CM | POA: Diagnosis not present

## 2021-03-08 DIAGNOSIS — Z79899 Other long term (current) drug therapy: Secondary | ICD-10-CM | POA: Diagnosis not present

## 2021-03-08 DIAGNOSIS — I110 Hypertensive heart disease with heart failure: Secondary | ICD-10-CM | POA: Insufficient documentation

## 2021-03-08 DIAGNOSIS — L03115 Cellulitis of right lower limb: Secondary | ICD-10-CM | POA: Insufficient documentation

## 2021-03-08 DIAGNOSIS — Z85820 Personal history of malignant melanoma of skin: Secondary | ICD-10-CM | POA: Insufficient documentation

## 2021-03-08 DIAGNOSIS — M7121 Synovial cyst of popliteal space [Baker], right knee: Secondary | ICD-10-CM | POA: Insufficient documentation

## 2021-03-08 DIAGNOSIS — M7989 Other specified soft tissue disorders: Secondary | ICD-10-CM | POA: Diagnosis present

## 2021-03-08 LAB — COMPREHENSIVE METABOLIC PANEL
ALT: 14 U/L (ref 0–44)
AST: 20 U/L (ref 15–41)
Albumin: 3.7 g/dL (ref 3.5–5.0)
Alkaline Phosphatase: 42 U/L (ref 38–126)
Anion gap: 11 (ref 5–15)
BUN: 26 mg/dL — ABNORMAL HIGH (ref 8–23)
CO2: 30 mmol/L (ref 22–32)
Calcium: 9.3 mg/dL (ref 8.9–10.3)
Chloride: 97 mmol/L — ABNORMAL LOW (ref 98–111)
Creatinine, Ser: 1.11 mg/dL (ref 0.61–1.24)
GFR, Estimated: >60 mL/min (ref >60)
Glucose, Bld: 153 mg/dL — ABNORMAL HIGH (ref 70–99)
Potassium: 3.6 mmol/L (ref 3.5–5.1)
Sodium: 138 mmol/L (ref 135–145)
Total Bilirubin: 0.5 mg/dL (ref 0.3–1.2)
Total Protein: 7.2 g/dL (ref 6.5–8.1)

## 2021-03-08 LAB — PROTIME-INR
INR: 2.5 — ABNORMAL HIGH (ref 0.8–1.2)
Prothrombin Time: 26.9 seconds — ABNORMAL HIGH (ref 11.4–15.2)

## 2021-03-08 LAB — CBC WITH DIFFERENTIAL/PLATELET
Abs Immature Granulocytes: 0.07 10*3/uL (ref 0.00–0.07)
Basophils Absolute: 0 10*3/uL (ref 0.0–0.1)
Basophils Relative: 0 %
Eosinophils Absolute: 0 10*3/uL (ref 0.0–0.5)
Eosinophils Relative: 0 %
HCT: 40.2 % (ref 39.0–52.0)
Hemoglobin: 12.8 g/dL — ABNORMAL LOW (ref 13.0–17.0)
Immature Granulocytes: 1 %
Lymphocytes Relative: 15 %
Lymphs Abs: 1.2 10*3/uL (ref 0.7–4.0)
MCH: 28.5 pg (ref 26.0–34.0)
MCHC: 31.8 g/dL (ref 30.0–36.0)
MCV: 89.5 fL (ref 80.0–100.0)
Monocytes Absolute: 1.8 10*3/uL — ABNORMAL HIGH (ref 0.1–1.0)
Monocytes Relative: 22 %
Neutro Abs: 5 10*3/uL (ref 1.7–7.7)
Neutrophils Relative %: 62 %
Platelets: 91 10*3/uL — ABNORMAL LOW (ref 150–400)
RBC: 4.49 MIL/uL (ref 4.22–5.81)
RDW: 14.9 % (ref 11.5–15.5)
Smear Review: DECREASED
WBC: 8.1 10*3/uL (ref 4.0–10.5)
nRBC: 0 % (ref 0.0–0.2)

## 2021-03-08 LAB — BRAIN NATRIURETIC PEPTIDE: B Natriuretic Peptide: 345.3 pg/mL — ABNORMAL HIGH (ref 0.0–100.0)

## 2021-03-08 MED ORDER — CEPHALEXIN 500 MG PO CAPS: 500.0000 mg | ORAL_CAPSULE | Freq: Four times a day (QID) | ORAL | 0 refills | Status: DC

## 2021-03-08 NOTE — ED Notes (Signed)
Pt urinated in his brief and was changed and put into adult diaper

## 2021-03-08 NOTE — ED Notes (Signed)
US @ BS. 

## 2021-03-08 NOTE — ED Provider Notes (Signed)
[name] Provider Note   CSN: [id number] Arrival date & time: 03/08/21  1722     History Chief Complaint  Patient presents with  . Leg Swelling    [name] is a 87 y.o. male.  The history is provided by the patient and medical records. No language interpreter was used.  Leg Pain Location:  Leg Pain details:    Severity:  No pain Tetanus status:  Unknown Prior injury to area:  Yes Relieved by:  Nothing Worsened by:  Nothing Associated symptoms: swelling   Associated symptoms: no back pain, no fatigue, no fever, no neck pain, no numbness, no stiffness and no tingling        Past Medical History:  Diagnosis Date  . Abnormality of gait 05/27/2016  . Adenomatous polyps   . Carpal tunnel syndrome 06/25/2016   Right  . Depressive disorder, not elsewhere classified   . First degree AV block    Holter 3/18: NSR, PACs, PVCs, no AFib, no pauses.  Marland Kitchen Hereditary and idiopathic peripheral neuropathy 06/25/2016  . Hypertension   . Internal nasal lesion 05/15/2013  . Melanoma ([place])    Left Shoulder  . Mitral regurgitation   . MVP (mitral valve prolapse)    a. With severe MR s/p Complex valvuloplasty including artificial Gore-tex neochord placement x4, chordal transposition x1, chordal release x1, # 32 mm Sorin Memo 3D Ring Annuloplasty 2012. // b. Echo 2/18: mild LVH, EF 50-55, mild AI, MV repair with mild MR, mod LAE, mod RVE, severe RAE, severe TR  . Neuropathy   . Normal coronary arteries    a. Normal coronary anatomy by cath 2012.  . Osteoarthritis    Knees  . PAF (paroxysmal atrial fibrillation) ([place])    a. Post-op MVR 2012.  Marland Kitchen Personal history of colonic polyps   . Prostate cancer ([place])   . Pulmonary HTN ([place])    a. Mild-mod by cath 2012.  . Pure hypercholesterolemia   . PVC (premature ventricular contraction)   . Thrombocytopenia ([place])   . Vision abnormalities    Cornea scarring    Patient Active Problem List   Diagnosis Date  Noted  . Bimalleolar fracture of right ankle 01/10/2021  . Pain in right foot 12/25/2020  . Pain in right ankle and joints of right foot 12/25/2020  . Hyperglycemia 08/14/2020  . High serum vitamin B12 08/14/2020  . Anemia 03/11/2020  . Spinal stenosis of lumbar region with neurogenic claudication 01/11/2020  . Breast mass in male 09/29/2019  . Hypoxia 04/01/2019  . Knee pain, bilateral 08/08/2018  . Mass of right hand 04/12/2018  . Chronic renal insufficiency 10/06/2017  . Spondylosis without myelopathy or radiculopathy, lumbar region 09/16/2017  . Hypokalemia 04/30/2017  . PAH (pulmonary artery hypertension) ([place])   . Right-sided heart failure ([place]) 04/26/2017  . Chronic diastolic heart failure ([place]) 04/21/2017  . Pulmonary hypertension, primary ([place]) 04/21/2017  . Severe tricuspid regurgitation 03/12/2017  . Bilateral lower extremity edema 02/25/2017  . Anticoagulated 02/25/2017  . Varicose veins of right lower extremity with complications 0000000  . Obstructive lung disease (generalized) ([place]) 09/26/2016  . Carpal tunnel syndrome 06/25/2016  . Hereditary and idiopathic peripheral neuropathy 06/25/2016  . Paresthesia 05/27/2016  . Abnormality of gait 05/27/2016  . Constipation 06/13/2015  . Encounter for therapeutic drug monitoring 05/25/2014  . Syncope 04/03/2014  . Low back pain 04/19/2013  . Melanoma ([place]) 09/30/2012  . Cough 03/26/2012  . Hx of mitral valve repair 11/19/2011  .  Long term (current) use of anticoagulants 11/03/2011  . Pleural effusion due to congestive heart failure ([place]) 10/28/2011  . Atrial fibrillation ([place]) 10/07/2011  . S/P mitral valve repair 10/01/2011  . Valvular heart disease 08/21/2011  . Hearing loss 04/09/2011  . Thrombocytopenia ([place]) 09/19/2010  . ADENOCARCINOMA, PROSTATE 09/17/2010  . CALLUS, LEFT FOOT 09/17/2010  . Backache 09/17/2010  . TINEA PEDIS 05/28/2009  . DERMATITIS, ATOPIC 04/10/2009  . HYPERCHOLESTEROLEMIA 06/09/2008   . DEPRESSION 06/09/2008  . HYPERTENSION, BENIGN ESSENTIAL 06/09/2008  . GERD 06/09/2008  . OSTEOARTHRITIS, GENERALIZED, MULTIPLE JOINTS 06/09/2008  . MUSCLE SPASM, BACK 06/09/2008  . H/O prostate cancer 06/09/2008  . PERSONAL HISTORY OF MALIGNANT MELANOMA OF SKIN 06/09/2008  . ARRHYTHMIA, HX OF 06/09/2008  . Personal history of colonic polyps 06/09/2008    Past Surgical History:  Procedure Laterality Date  . CARDIAC CATHETERIZATION  09/2011   Pre-op for MVR -- normal coronaries.  Marland Kitchen CARDIOVERSION N/A 01/02/2016   Procedure: CARDIOVERSION;  Surgeon: Thayer Headings, MD;  Location: [facility] ENDOSCOPY;  Service: Cardiovascular;  Laterality: N/A;  . COLONOSCOPY W/ POLYPECTOMY    . INGUINAL HERNIA REPAIR  09/2009   Left  . KNEE ARTHROSCOPY      left x3  and right x2  . Melanoma Surgery     2001, 2005, 2006, 2009  . MITRAL VALVE REPAIR  10/01/2011   complex valvuloplasty with Goretex cord replacement and chordal transposition 23mm Sorin Memo 3D ring annuloplasty  . Nuclear Stress Test  09/2006   EF-64%, Normal  . PROSTATECTOMY  1993  . RIGHT HEART CATH N/A 04/29/2017   Procedure: Right Heart Cath;  Surgeon: Jolaine Artist, MD;  Location: [place] CV LAB;  Service: Cardiovascular;  Laterality: N/A;  . ROOT CANAL  08-19-12  . ROTATOR CUFF REPAIR  2003   left  . TEE WITHOUT CARDIOVERSION  09/26/2011   Procedure: TRANSESOPHAGEAL ECHOCARDIOGRAM (TEE);  Surgeon: Lelon Perla, MD;  [facility]COPY;  Service: Cardiovascular;  Laterality: N/A;  . US ECHOCARDIOGRAPHY  09/2009, 08/1011   mild LVH,mild AI,MVP with mild MR, mild-mod. TR with mild Pulm. HTN, EF-55-60%       Family History  Problem Relation Age of Onset  . Clotting disorder Brother        CVA's  . Arthritis Mother   . Hypertension Mother   . Stroke Mother   . Hypertension Father   . Psychosis Father        psychiatric care  . Colon cancer Neg Hx   . Stomach cancer Neg Hx   . Heart attack Neg Hx   .  Prostate cancer Neg Hx   . Pancreatic cancer Neg Hx     Social History   Tobacco Use  . Smoking status: Never Smoker  . Smokeless tobacco: Never Used  Vaping Use  . Vaping Use: Never used  Substance Use Topics  . Alcohol use: No    Alcohol/week: 0.0 standard drinks    Comment: Last drink in 2000  . Drug use: No    Home Medications Prior to Admission medications   Medication Sig Start Date End Date Taking? Authorizing Provider  acetaminophen (TYLENOL) 500 MG tablet Take 1,000 mg by mouth every 6 (six) hours as needed for mild pain.    [provider]  amiodarone (PACERONE) 200 MG tablet Take 0.5 tablets (100 mg total) by mouth daily. 12/13/20   Bensimhon, Shaune Pascal, MD  amoxicillin (AMOXIL) 500 MG capsule  10/22/20   [provider]  b complex vitamins tablet Take 1 tablet by mouth daily.    [provider]  cycloSPORINE (RESTASIS) 0.05 % ophthalmic emulsion Place 1 drop into both eyes 2 (two) times daily.    [provider]  fluticasone (FLONASE) 50 MCG/ACT nasal spray Place 2 sprays into both nostrils daily.  04/28/18   [provider]  gabapentin (NEURONTIN) 300 MG capsule TAKE 1 CAPSULE BY MOUTH  TWICE DAILY 01/07/21   Magnus Sinning, MD  Multiple Vitamin (MULTIVITAMIN WITH MINERALS) TABS tablet Take 1 tablet by mouth every evening.     [provider]  Neo-Bacit-Poly-Lidocaine (TRIPLE ANTIBIOTIC MAX ST EX) Apply topically.    [provider]  potassium chloride SA (KLOR-CON M20) 20 MEQ tablet Take 2 tablets (40 mEq total) by mouth daily. 09/17/20   Bensimhon, Shaune Pascal, MD  rosuvastatin (CRESTOR) 5 MG tablet TAKE 1 TABLET BY MOUTH  DAILY 01/04/21   Bensimhon, Shaune Pascal, MD  torsemide (DEMADEX) 20 MG tablet TAKE 2 TABLETS BY MOUTH  DAILY 04/17/20   Bensimhon, Shaune Pascal, MD  traZODone (DESYREL) 100 MG tablet Take 200 mg by mouth at bedtime.    [provider]  warfarin (COUMADIN) 5 MG tablet TAKE 1 TABLET BY MOUTH   DAILY AS DIRECTED BY THE  COUMADIN CLINIC 10/10/20   Bensimhon, Shaune Pascal, MD  pantoprazole (PROTONIX) 40 MG tablet Take 1 tablet (40 mg total) by mouth daily before breakfast. 10/16/11 06/29/19  Nani Skillern, PA-C    Allergies    Patient has no known allergies.  Review of Systems   Review of Systems  Constitutional: Negative for chills, diaphoresis, fatigue and fever.  HENT: Negative for congestion.   Respiratory: Negative for cough, chest tightness, shortness of breath and wheezing.   Cardiovascular: Positive for leg swelling. Negative for chest pain and palpitations.  Gastrointestinal: Negative for abdominal pain, constipation, diarrhea, nausea and vomiting.  Genitourinary: Negative for flank pain and frequency.  Musculoskeletal: Negative for back pain, neck pain and stiffness.  Skin: Positive for color change (more redness on dorsum of r foot) and wound.  Neurological: Negative for weakness, light-headedness, numbness and headaches.  Psychiatric/Behavioral: Negative for agitation.    Physical Exam Updated Vital Signs BP (!) 141/94 (BP Location: Right Arm)   Pulse 79   Temp 98.3 F (36.8 C) (Oral)   Resp 18   Ht 6\' 4"  (1.93 m)   Wt 78 kg   SpO2 95%   BMI 20.94 kg/m   Physical Exam Vitals and nursing note reviewed.  Constitutional:      General: He is not in acute distress.    Appearance: Normal appearance. He is well-developed. He is not ill-appearing, toxic-appearing or diaphoretic.  HENT:     Head: Normocephalic and atraumatic.  Eyes:     Conjunctiva/sclera: Conjunctivae normal.  Cardiovascular:     Rate and Rhythm: Normal rate and regular rhythm.     Heart sounds: No murmur heard.   Pulmonary:     Effort: Pulmonary effort is normal. No respiratory distress.     Breath sounds: Normal breath sounds. No wheezing, rhonchi or rales.  Chest:     Chest wall: No tenderness.  Abdominal:     General: Abdomen is flat.     Palpations: Abdomen is soft.      Tenderness: There is no abdominal tenderness. There is no right CVA tenderness, left CVA tenderness, guarding or rebound.  Musculoskeletal:        General: Swelling present. No  tenderness.     Cervical back: Neck supple.     Right lower leg: Edema present.     Left lower leg: No edema.  Skin:    General: Skin is warm and dry.     Capillary Refill: Capillary refill takes less than 2 seconds.     Findings: Erythema present. No bruising or rash.  Neurological:     General: No focal deficit present.     Mental Status: He is alert.  Psychiatric:        Mood and Affect: Mood normal.     ED Results / Procedures / Treatments   Labs (all labs ordered are listed, but only abnormal results are displayed) Labs Reviewed  CBC WITH DIFFERENTIAL/PLATELET - Abnormal; Notable for the following components:      Result Value   Hemoglobin 12.8 (*)    Platelets 91 (*)    Monocytes Absolute 1.8 (*)    All other components within normal limits  COMPREHENSIVE METABOLIC PANEL - Abnormal; Notable for the following components:   Chloride 97 (*)    Glucose, Bld 153 (*)    BUN 26 (*)    All other components within normal limits  PROTIME-INR - Abnormal; Notable for the following components:   Prothrombin Time 26.9 (*)    INR 2.5 (*)    All other components within normal limits  BRAIN NATRIURETIC PEPTIDE - Abnormal; Notable for the following components:   B Natriuretic Peptide 345.3 (*)    All other components within normal limits    EKG None  Radiology US Venous Img Lower Unilateral Right  Result Date: 03/08/2021 CLINICAL DATA:  Right lower extremity swelling x5 days. EXAM: Right LOWER EXTREMITY VENOUS DOPPLER ULTRASOUND TECHNIQUE: Gray-scale sonography with compression, as well as color and duplex ultrasound, were performed to evaluate the deep venous system(s) from the level of the common femoral vein through the popliteal and proximal calf veins. COMPARISON:  None. FINDINGS: VENOUS Normal  compressibility of the common femoral, superficial femoral, and popliteal veins, as well as the visualized calf veins. Visualized portions of profunda femoral vein and great saphenous vein unremarkable. No filling defects to suggest DVT on grayscale or color Doppler imaging. Doppler waveforms show normal direction of venous flow, normal respiratory plasticity and response to augmentation. Limited views of the contralateral common femoral vein are unremarkable. OTHER There is a complex 5.1 x 2.7 x 1.8 cm collection in the right medial popliteal fossa without internal Doppler flow. Limitations: none IMPRESSION: 1. No evidence of DVT within the right lower extremity. 2. Complex 5.1 cm in the right popliteal fossa, likely representing a complex Baker's cyst. Electronically Signed   By: Dahlia Bailiff MD   On: 03/08/2021 19:05    Procedures Procedures   Medications Ordered in ED Medications - No data to display  ED Course  I have reviewed the triage vital signs and the nursing notes.  Pertinent labs & imaging results that were available during my care of the patient were reviewed by me and considered in my medical decision making (see chart for details).    MDM Rules/Calculators/A&P                          [name] is a 87 y.o. male with a past medical history significant for atrial fibrillation on Coumadin therapy, heart failure, pulmonary hypertension, hypertension, GERD, melanoma, prostate cancer, verbal report of previous ablation to right leg veins in the past, and  previous right ankle fracture status post immobilization nonoperative management several months ago as well as chronic pressure wound to his right heel that is well-healing who presents with 5 days of right leg swelling.  Patient reports that for the last 5 days, he has had swelling in his right leg from the calf down.  He reports this is new.  He is never had this before.  He reports that he broke his ankle back in January and  was managed nonoperatively.  He is doing well.  He did develop a wound on the right heel that is being monitored and managed and has not been infected.  He reports no pain in the right ankle or right leg and denies any new trauma.  He denies any fevers, chills, chest pain, shortness of breath, nausea, vomiting, or urinary changes.  He reports he has been taking his Coumadin as directed without any reportedly abnormal INR values recently.  He spoke to family who told him to come in to rule out DVT.  Patient says that the top of his foot does look a little more red than normal or he has some dry and cracked skin.  He denies other complaints on arrival.  On exam, lungs are clear and chest is nontender.  Abdomen is nontender.  He is moving all extremities.  Intact DP and PT pulse in the right leg but his leg is extremely edematous from the mid calf down.  He has normal range of motion and does not have significant tenderness.  He has a wound on his right heel that does not appear to be infected with no surrounding erythema, crepitance, or purulent drainage.  He has some erythema on the dorsum of his foot that is nontender.  No crepitance there either.  Exam otherwise unremarkable.  Clinically I have a high suspicion for acute DVT.  We will get DVT ultrasound initially.  Patient was concerned about it being his heart and heart failure while he is having edema but we discussed that is less likely given the asymmetry of the edema.  We will however get a BNP and some screening labs in case he needs to have his anticoagulation altered.  We discussed that if his ultrasound is negative and labs are reassuring, it may be that patient has a mild cellulitis and has had associated edema given his poor venous circulation in the leg causing the asymmetric swelling to occur.  Anticipate reassessment after work-up to determine disposition.    9:14 PM Work-up is returned.  Patient's labs are all similar to prior.  No  evidence to suggest systemic infection or other concerning finding.  The ultrasound shows evidence of a Baker's cyst likely contributing to some of the swelling.  No evidence of DVT which is reassuring to the patient.  Given his lack of any tenderness on reexam on the back of the leg, have very low suspicion that there is some complex purulent abscess without any discomfort or redness changes in the location.   Had a long discussion with patient about antibiotics or not.  Given the small area of cracked skin and erythema I do feel comfortable treating him with antibiotics to cover for any early developing cellulitis given this edema.  He was given instructions on managing the Baker's cyst and will follow up with PCP.  He understands return precautions and follow-up instructions.  He had no other questions or concerns and was discharged in good condition.    Final Clinical Impression(s) /  ED Diagnoses Final diagnoses:  Baker cyst, right  Leg edema  Cellulitis of right lower extremity    Rx / DC Orders ED Discharge Orders         Ordered    cephALEXin (KEFLEX) 500 MG capsule  4 times daily        03/08/21 2117          . Clinical Impression: 1. Baker cyst, right   2. Leg edema   3. Cellulitis of right lower extremity     Disposition: Discharge  Condition: Good  I have discussed the results, Dx and Tx plan with the pt(& family if present). He/she/they expressed understanding and agree(s) with the plan. Discharge instructions discussed at great length. Strict return precautions discussed and pt &/or family have verbalized understanding of the instructions. No further questions at time of discharge.    New Prescriptions   CEPHALEXIN (KEFLEX) 500 MG CAPSULE    Take 1 capsule (500 mg total) by mouth 4 (four) times daily for 7 days.    Follow Up: [name], MD [address] [place] [address] [phone]     [facility] [place] [facility]ENCY  DEPARTMENT [address] [id number] [place]       Vinod Mikesell, Gwenyth Allegra, MD 03/08/21 2118

## 2021-03-08 NOTE — ED Triage Notes (Signed)
Pt c/o right LE x 5 days-denies injury-reports hx of "ablation" to same extremity in 2018-pt to triage in own w/c-NAD

## 2021-03-08 NOTE — ED Notes (Signed)
Right foot edema since Monday - 2nd tow amputation

## 2021-03-08 NOTE — Discharge Instructions (Signed)
Your ultrasound today showed no evidence of DVT but did show area of likely Baker's cyst.  Please follow-up with your primary doctor for further management of this.  Given your exam I am concerned about early cellulitis on the dorsum of the right foot.  Please take the antibiotics to help treat early infection and maintain hydration.  If any symptoms are to change or worsen, please return to the nearest emergency department

## 2021-03-14 ENCOUNTER — Ambulatory Visit (INDEPENDENT_AMBULATORY_CARE_PROVIDER_SITE_OTHER): Payer: Medicare Other

## 2021-03-14 ENCOUNTER — Telehealth: Payer: Self-pay

## 2021-03-14 DIAGNOSIS — I4891 Unspecified atrial fibrillation: Secondary | ICD-10-CM | POA: Diagnosis not present

## 2021-03-14 DIAGNOSIS — Z5181 Encounter for therapeutic drug level monitoring: Secondary | ICD-10-CM | POA: Diagnosis not present

## 2021-03-14 LAB — POCT INR: INR: 3 (ref 2.0–3.0)

## 2021-03-14 NOTE — Telephone Encounter (Signed)
Called melissa , mailbox full unable to give VO 

## 2021-03-14 NOTE — Patient Instructions (Signed)
Description   Spoke with Lenna Sciara, RN with [facility] while in home with pt and advised pt to continue on same dosage 1/2 tablet daily except 1 tablet on Mondays and Fridays.  Recheck INR in 2 weeks by [place] [phone]). Call [phone] if scheduled for any procedures or on any new medications

## 2021-03-14 NOTE — Telephone Encounter (Signed)
OK to give VO as requested 

## 2021-03-14 NOTE — Telephone Encounter (Signed)
[name] :  [facility]-  Call back: [phone]   VO:  2x a week for 4 weeks - skilled nursing for wound care   Okay to give VO ?

## 2021-03-14 NOTE — Telephone Encounter (Signed)
Called [name] , unable to leave voicemail

## 2021-03-15 ENCOUNTER — Ambulatory Visit (INDEPENDENT_AMBULATORY_CARE_PROVIDER_SITE_OTHER): Payer: Medicare Other | Admitting: Medical

## 2021-03-15 ENCOUNTER — Telehealth: Payer: Self-pay | Admitting: *Deleted

## 2021-03-15 ENCOUNTER — Telehealth: Payer: Self-pay | Admitting: Medical

## 2021-03-15 ENCOUNTER — Other Ambulatory Visit: Payer: Self-pay

## 2021-03-15 ENCOUNTER — Ambulatory Visit (HOSPITAL_BASED_OUTPATIENT_CLINIC_OR_DEPARTMENT_OTHER)
Admission: RE | Admit: 2021-03-15 | Discharge: 2021-03-15 | Disposition: A | Discharge status: Home / Self Care | Payer: Medicare Other | Source: Ambulatory Visit | Attending: Medical | Admitting: Medical

## 2021-03-15 VITALS — BP 121/61 | HR 81 | Resp 20 | Ht 76.0 in | Wt 173.0 lb

## 2021-03-15 DIAGNOSIS — M7121 Synovial cyst of popliteal space [Baker], right knee: Secondary | ICD-10-CM | POA: Diagnosis not present

## 2021-03-15 DIAGNOSIS — M1711 Unilateral primary osteoarthritis, right knee: Secondary | ICD-10-CM | POA: Diagnosis not present

## 2021-03-15 DIAGNOSIS — L089 Local infection of the skin and subcutaneous tissue, unspecified: Secondary | ICD-10-CM | POA: Diagnosis not present

## 2021-03-15 MED ORDER — DOXYCYCLINE HYCLATE 100 MG PO TABS: 100.0000 mg | ORAL_TABLET | Freq: Two times a day (BID) | ORAL | 0 refills | Status: DC

## 2021-03-15 NOTE — Telephone Encounter (Signed)
Called [place] , mailbox full unable to give VO

## 2021-03-15 NOTE — Telephone Encounter (Signed)
Rx doxy sent to pharmacy. 

## 2021-03-15 NOTE — Telephone Encounter (Signed)
Pt called and stated that he is starting Doxy. Per chart it says he is taking Doxy 100mg  BID- 10day course. Pt starting today. Pt has [place] coming to the house. Called and spoke to [facility] who stated that they will come out on Monday and check his INR.

## 2021-03-15 NOTE — Telephone Encounter (Signed)
VO given to [facility]

## 2021-03-15 NOTE — Progress Notes (Signed)
Subjective:    Patient ID: [name] [name], male    DOB: [birth year], 87 y.o.   MRN: [id number]  HPI  Pt in for follow up from ED.   Pt had pain behind knee and mild swelling. He states  minmal pain behind the knee. Little less swelling than it was. Pain more at end of the day. He also clariifies pain more in front of knee like osteoarthritis which he has. No chest pain or shortness of breath.   Seen in ED on 03-08-2021.  Hpi from ED.   "The history is provided by the patient and medical records. No language interpreter was used.  Leg Pain Location:  Leg Pain details:    Severity:  No pain Tetanus status:  Unknown Prior injury to area:  Yes Relieved by:  Nothing Worsened by:  Nothing Associated symptoms: swelling   Associated symptoms: no back pain, no fatigue, no fever, no neck pain, no numbness, no stiffness and no tingling "  Also had redness to rt foot. Pt was given keflex antibiotic.   Pt had 2 months of left heel ulcer/wound. Pt has wound care coming out now for 2 months. He states it was worse. He shows me pictures and was much worse.  Rt heel small wound was worse. Pt thinks foot looks worse than yeserday.  In ED wbc not elevated. CMP ok but mild sugar elevation.       Review of Systems  Constitutional: Negative for chills, fatigue and fever.  Respiratory: Negative for cough, chest tightness, shortness of breath and wheezing.   Cardiovascular: Negative for chest pain and palpitations.  Gastrointestinal: Negative for abdominal pain, constipation, diarrhea, rectal pain and vomiting.  Musculoskeletal: Negative for back pain and joint swelling.       See hpi. On rt knee, baker cyst, rt foot and heel complaints.  Skin: Negative for rash.  Neurological: Negative for dizziness, speech difficulty, weakness, numbness and headaches.  Hematological: Negative for adenopathy. Does not bruise/bleed easily.  Psychiatric/Behavioral: Negative for behavioral problems.     Past Medical History:  Diagnosis Date  . Abnormality of gait 05/27/2016  . Adenomatous polyps   . Carpal tunnel syndrome 06/25/2016   Right  . Depressive disorder, not elsewhere classified   . First degree AV block    Holter 3/18: NSR, PACs, PVCs, no AFib, no pauses.  Marland Kitchen Hereditary and idiopathic peripheral neuropathy 06/25/2016  . Hypertension   . Internal nasal lesion 05/15/2013  . Melanoma ([place])    Left Shoulder  . Mitral regurgitation   . MVP (mitral valve prolapse)    a. With severe MR s/p Complex valvuloplasty including artificial Gore-tex neochord placement x4, chordal transposition x1, chordal release x1, # 32 mm Sorin Memo 3D Ring Annuloplasty 2012. // b. Echo 2/18: mild LVH, EF 50-55, mild AI, MV repair with mild MR, mod LAE, mod RVE, severe RAE, severe TR  . Neuropathy   . Normal coronary arteries    a. Normal coronary anatomy by cath 2012.  . Osteoarthritis    Knees  . PAF (paroxysmal atrial fibrillation) ([place])    a. Post-op MVR 2012.  Marland Kitchen Personal history of colonic polyps   . Prostate cancer ([place])   . Pulmonary HTN ([place])    a. Mild-mod by cath 2012.  . Pure hypercholesterolemia   . PVC (premature ventricular contraction)   . Thrombocytopenia ([place])   . Vision abnormalities    Cornea scarring     Social History   Socioeconomic History  .  Marital status: Widowed    Spouse name: Not on file  . Number of children: 2  . Years of education: 35  . Highest education level: Not on file  Occupational History    Comment: retired  Tobacco Use  . Smoking status: Never Smoker  . Smokeless tobacco: Never Used  Vaping Use  . Vaping Use: Never used  Substance and Sexual Activity  . Alcohol use: No    Alcohol/week: 0.0 standard drinks    Comment: Last drink in 2000  . Drug use: No  . Sexual activity: Not Currently  Other Topics Concern  . Not on file  Social History Narrative   Retired - Optometrist   Widower   2 children (one in [place] and on one in [place])     Drinks 1 cup of coffee per day   Social Determinants of Radio broadcast assistant Strain: Low Risk   . Difficulty of Paying Living Expenses: Not hard at all  Food Insecurity: No Food Insecurity  . Worried About Charity fundraiser in the Last Year: Never true  . Ran Out of Food in the Last Year: Never true  Transportation Needs: No Transportation Needs  . Lack of Transportation (Medical): No  . Lack of Transportation (Non-Medical): No  Physical Activity: Not on file  Stress: Not on file  Social Connections: Not on file  Intimate Partner Violence: Not on file    Past Surgical History:  Procedure Laterality Date  . CARDIAC CATHETERIZATION  09/2011   Pre-op for MVR -- normal coronaries.  Marland Kitchen CARDIOVERSION N/A 01/02/2016   Procedure: CARDIOVERSION;  Surgeon: Thayer Headings, MD;  Location: [facility] ENDOSCOPY;  Service: Cardiovascular;  Laterality: N/A;  . COLONOSCOPY W/ POLYPECTOMY    . INGUINAL HERNIA REPAIR  09/2009   Left  . KNEE ARTHROSCOPY      left x3  and right x2  . Melanoma Surgery     2001, 2005, 2006, 2009  . MITRAL VALVE REPAIR  10/01/2011   complex valvuloplasty with Goretex cord replacement and chordal transposition 21mm Sorin Memo 3D ring annuloplasty  . Nuclear Stress Test  09/2006   EF-64%, Normal  . PROSTATECTOMY  1993  . RIGHT HEART CATH N/A 04/29/2017   Procedure: Right Heart Cath;  Surgeon: Jolaine Artist, MD;  Location: [facility] LAB;  Service: Cardiovascular;  Laterality: N/A;  . ROOT CANAL  08-19-12  . ROTATOR CUFF REPAIR  2003   left  . TEE WITHOUT CARDIOVERSION  09/26/2011   Procedure: TRANSESOPHAGEAL ECHOCARDIOGRAM (TEE);  Surgeon: Lelon Perla, MD;  Location: [facility] ENDOSCOPY;  Service: Cardiovascular;  Laterality: N/A;  . US ECHOCARDIOGRAPHY  09/2009, 08/1011   mild LVH,mild AI,MVP with mild MR, mild-mod. TR with mild Pulm. HTN, EF-55-60%    Family History  Problem Relation Age of Onset  . Clotting disorder Brother        CVA's  .  Arthritis Mother   . Hypertension Mother   . Stroke Mother   . Hypertension Father   . Psychosis Father        psychiatric care  . Colon cancer Neg Hx   . Stomach cancer Neg Hx   . Heart attack Neg Hx   . Prostate cancer Neg Hx   . Pancreatic cancer Neg Hx     No Known Allergies  Current Outpatient Medications on File Prior to Visit  Medication Sig Dispense Refill  . acetaminophen (TYLENOL) 500 MG tablet Take 1,000 mg by  mouth every 6 (six) hours as needed for mild pain.    Marland Kitchen amiodarone (PACERONE) 200 MG tablet Take 0.5 tablets (100 mg total) by mouth daily. 90 tablet 0  . b complex vitamins tablet Take 1 tablet by mouth daily.    . cephALEXin (KEFLEX) 500 MG capsule Take 1 capsule (500 mg total) by mouth 4 (four) times daily for 7 days. 28 capsule 0  . cycloSPORINE (RESTASIS) 0.05 % ophthalmic emulsion Place 1 drop into both eyes 2 (two) times daily.    . fluticasone (FLONASE) 50 MCG/ACT nasal spray Place 2 sprays into both nostrils daily.   11  . gabapentin (NEURONTIN) 300 MG capsule TAKE 1 CAPSULE BY MOUTH  TWICE DAILY 180 capsule 3  . Multiple Vitamin (MULTIVITAMIN WITH MINERALS) TABS tablet Take 1 tablet by mouth every evening.     . Neo-Bacit-Poly-Lidocaine (TRIPLE ANTIBIOTIC MAX ST EX) Apply topically.    . potassium chloride SA (KLOR-CON M20) 20 MEQ tablet Take 2 tablets (40 mEq total) by mouth daily. 180 tablet 3  . rosuvastatin (CRESTOR) 5 MG tablet TAKE 1 TABLET BY MOUTH  DAILY 90 tablet 3  . torsemide (DEMADEX) 20 MG tablet TAKE 2 TABLETS BY MOUTH  DAILY 180 tablet 3  . traZODone (DESYREL) 100 MG tablet Take 200 mg by mouth at bedtime.    Marland Kitchen warfarin (COUMADIN) 5 MG tablet TAKE 1 TABLET BY MOUTH  DAILY AS DIRECTED BY THE  COUMADIN CLINIC 90 tablet 3  . [DISCONTINUED] pantoprazole (PROTONIX) 40 MG tablet Take 1 tablet (40 mg total) by mouth daily before breakfast.     No current facility-administered medications on file prior to visit.    BP 121/61   Pulse 81   Resp 20    Ht 6\' 4"  (1.93 m)   Wt 173 lb (78.5 kg)   SpO2 100%   BMI 21.06 kg/m       Objective:   Physical Exam  General- No acute distress. Pleasant patient. Neck- Full range of motion, no jvd Lungs- Clear, even and unlabored. Heart- regular rate and rhythm. Neurologic- CNII- XII grossly intact.  Right knee- no obvious swelling, no redness, no warmth.  Does have some mild tenderness to palpation tibial plateau region. Right lower extremity- negative Homans testing.  Right calf looks slightly swollen.  Right foot- distal portion of foot metastatic to toes mild pinkish-red appearance with faint bruise appearance to toes.  No open wounds or lesions to foot. Right heel-small superficial heel wound present.  No surrounding redness, warmth or tenderness. Left heel- appears to have superficial ulcer about 2 cm wide.  Moist appearance.(Patient does show me pictures on his smart phone and it was much worse in the past.)      Assessment & Plan:  Recent right side Baker's cyst found during ED evaluation/work-up.  No report of any DVT.  Based on the size decided best to get opinion from patient's orthopedist.  Referral to Dr. Durward Fortes.  History of right knee osteoarthritis.  A lot of patient's knee region complaint seems to be probably from the osteoarthritis.  Right foot does appear infected.  Recent use of Keflex.  Some incremental worsening compared to yesterday per patient.  Decided to stop Keflex and prescribe doxycycline which usually has much better coverage for infection.  You are on Coumadin so we will watch your INR rechecked this coming wed and adjustments might need to be made after doxycycline use.  Asking medical assistant staff to call home health care to  arrange that INR check.   History of bilateral heel wounds.  Small wound on right side.  However moderate sized wound on left heel.  Wound culture of left heel sent out.  We will follow the culture and notify of result.  Glad to know  that wound care nurses coming out as well.  Follow-up in 1 week or as needed.  I want you to be seen sooner if you feel that right lower extremity/foot appearance is worsening.  Time spent with patient today was 43  minutes which consisted of chart review, discussing diagnosis, work up, treatment and documentation.

## 2021-03-15 NOTE — Telephone Encounter (Signed)
Pt.notified

## 2021-03-15 NOTE — Telephone Encounter (Signed)
Pt,would like Edward to remember to send in this prescription   Medication: doxycycline (VIBRA-TABS) 100 MG tablet [[id number]] ENDED     Has the patient contacted their pharmacy? no (If no, request that the patient contact the pharmacy for the refill.) (If yes, when and what did the pharmacy advise?)    Preferred Pharmacy (with phone number or street name):  [facility] [place], [place] Phone:  [phone]  Fax:  [phone]         Agent: Please be advised that RX refills may take up to 3 business days. We ask that you follow-up with your pharmacy.

## 2021-03-15 NOTE — Patient Instructions (Signed)
Recent right side Baker's cyst found during ED evaluation/work-up.  No report of any DVT.  Based on the size decided best to get opinion from patient's orthopedist.  Referral to Dr. Durward Fortes.  History of right knee osteoarthritis.  A lot of patient's knee region complaint seems to be probably from the osteoarthritis.  Right foot does appear infected.  Recent use of Keflex.  Some incremental worsening compared to yesterday per patient.  Decided to stop Keflex and prescribe doxycycline which usually has much better coverage for infection.  You are on Coumadin so we will watch your INR rechecked this coming wed  and adjustments might need to be made after doxycycline use.  Asking medical assistant staff to call home health care to arrange that INR check.   History of bilateral heel wounds.  Small wound on right side.  However moderate sized wound on left heel.  Wound culture of left heel sent out.  We will follow the culture and notify of result.  Glad to know that wound care nurses coming out as well.  Follow-up in 1 week or as needed.  I want you to be seen sooner if you feel that right lower extremity/foot appearance is worsening.

## 2021-03-15 NOTE — Telephone Encounter (Signed)
[name] [name] would like for home health to check patients INR earlier due to patient being but on doxycycline.   Spoke with Dena at [facility] and they will check next Wednesday.

## 2021-03-15 NOTE — Telephone Encounter (Signed)
Rx doxy sent to pt pharmacy. 

## 2021-03-19 ENCOUNTER — Encounter: Payer: Self-pay | Admitting: Orthopaedic Surgery

## 2021-03-19 ENCOUNTER — Other Ambulatory Visit: Payer: Self-pay

## 2021-03-19 ENCOUNTER — Ambulatory Visit (INDEPENDENT_AMBULATORY_CARE_PROVIDER_SITE_OTHER): Payer: Medicare Other | Admitting: Orthopaedic Surgery

## 2021-03-19 VITALS — Ht 76.0 in | Wt 173.0 lb

## 2021-03-19 DIAGNOSIS — M7121 Synovial cyst of popliteal space [Baker], right knee: Secondary | ICD-10-CM | POA: Diagnosis not present

## 2021-03-19 DIAGNOSIS — M159 Polyosteoarthritis, unspecified: Secondary | ICD-10-CM

## 2021-03-19 LAB — WOUND CULTURE
MICRO NUMBER:: 11831606
SPECIMEN QUALITY:: ADEQUATE

## 2021-03-19 MED ORDER — METHYLPREDNISOLONE ACETATE 40 MG/ML IJ SUSP
80.0000 mg | INTRAMUSCULAR | Status: AC | PRN
  Administered 2021-03-19: 80 mg via INTRA_ARTICULAR

## 2021-03-19 MED ORDER — LIDOCAINE HCL 1 % IJ SOLN
2.0000 mL | INTRAMUSCULAR | Status: AC | PRN
  Administered 2021-03-19: 2 mL

## 2021-03-19 MED ORDER — BUPIVACAINE HCL 0.25 % IJ SOLN
2.0000 mL | INTRAMUSCULAR | Status: AC | PRN
  Administered 2021-03-19: 2 mL via INTRA_ARTICULAR

## 2021-03-19 NOTE — Telephone Encounter (Signed)
Pt in the Coumadin Clinic follow up book, due to have Inr checked on 5/2. Called [facility] who stated that they will be going out tomorrow to check pt's INR .

## 2021-03-19 NOTE — Progress Notes (Signed)
Office Visit Note   Patient: [name] [name]           Date of Birth: [birth year]           MRN: [id number] Visit Date: 03/19/2021              Requested by: Mackie Pai, PA-C [place],  [place] [address] PCP: [name], MD   Assessment & Plan: Visit Diagnoses:  1. OSTEOARTHRITIS, GENERALIZED, MULTIPLE JOINTS   2. Popliteal cyst, right     Plan: [name] was recently evaluated the emergency room for swelling of his right lower extremity.  He does have venous stasis disease.  Doppler study was performed without evidence of DVT.  They did find a Baker's cyst and thus referred him to the office.  The cyst is relatively small and does not appear to be causing any problem.  He does have osteoarthritis of his knee.  I aspirated the knee of about 20 cc of somewhat bloody fluid.  He is on a blood thinner and then injected cortisone.  He does have a heel ulcer and its been hard for him to wear his support stockings.  Once the ulcer is healed it would be worthwhile for him to wear the support stockings.  He will follow-up with his other providers  Follow-Up Instructions: Return if symptoms worsen or fail to improve.   Orders:  Orders Placed This Encounter  Procedures  . Large Joint Inj: R knee   No orders of the defined types were placed in this encounter.     Procedures: Large Joint Inj: R knee on 03/19/2021 1:38 PM Indications: pain and diagnostic evaluation Details: 25 G 1.5 in needle, anteromedial approach  Arthrogram: No  Medications: 2 mL lidocaine 1 %; 80 mg methylPREDNISolone acetate 40 MG/ML; 2 mL bupivacaine 0.25 % Aspirate: 20 mL blood-tinged Outcome: tolerated well, no immediate complications Procedure, treatment alternatives, risks and benefits explained, specific risks discussed. Consent was given by the patient. Immediately prior to procedure a time out was called to verify the correct patient, procedure, equipment, support staff and  site/side marked as required. Patient was prepped and draped in the usual sterile fashion.       Clinical Data: No additional findings.   Subjective: Chief Complaint  Patient presents with  . Right Knee - Pain  Patient presents today for right leg swelling. He said that it started almost two weeks ago with calf swelling and discoloring of the skin. He had it checked and was told it was concern for DVT. He had an ultrasound and it was negative for DVT, but did see a Bakers cyst. He said that he does not have pain, but does have swelling that gets worse during the day.   HPI  Review of Systems   Objective: Vital Signs: Ht 6\' 4"  (1.93 m)   Wt 173 lb (78.5 kg)   BMI 21.06 kg/m   Physical Exam Constitutional:      Appearance: He is well-developed.  Eyes:     Pupils: Pupils are equal, round, and reactive to light.  Pulmonary:     Effort: Pulmonary effort is normal.  Skin:    General: Skin is warm and dry.  Neurological:     Mental Status: He is alert and oriented to person, place, and time.  Psychiatric:        Behavior: Behavior normal.     Ortho Exam awake alert and oriented x3.  Comfortable sitting.  Has significant venous stasis changes in both lower extremities from about the mid tibia to the ankle.  There is no calf pain.  He does have some swelling from the mid tibia on the right to the ankle with very minimal pitting.  No knee pain but does have a positive effusion and some osteophytes palpable.  Small popliteal cyst without any pain or redness.  No knee instability.  Full extension flexed over 100 degrees.  Specialty Comments:  No specialty comments available.  Imaging: No results found.   PMFS History: Patient Active Problem List   Diagnosis Date Noted  . Popliteal cyst, right 03/19/2021  . Bimalleolar fracture of right ankle 01/10/2021  . Pain in right foot 12/25/2020  . Pain in right ankle and joints of right foot 12/25/2020  . Hyperglycemia 08/14/2020   . High serum vitamin B12 08/14/2020  . Anemia 03/11/2020  . Spinal stenosis of lumbar region with neurogenic claudication 01/11/2020  . Breast mass in male 09/29/2019  . Hypoxia 04/01/2019  . Knee pain, bilateral 08/08/2018  . Mass of right hand 04/12/2018  . Chronic renal insufficiency 10/06/2017  . Spondylosis without myelopathy or radiculopathy, lumbar region 09/16/2017  . Hypokalemia 04/30/2017  . PAH (pulmonary artery hypertension) ([place])   . Right-sided heart failure ([place]) 04/26/2017  . Chronic diastolic heart failure ([place]) 04/21/2017  . Pulmonary hypertension, primary ([place]) 04/21/2017  . Severe tricuspid regurgitation 03/12/2017  . Bilateral lower extremity edema 02/25/2017  . Anticoagulated 02/25/2017  . Varicose veins of right lower extremity with complications 50/07/3817  . Obstructive lung disease (generalized) ([place]) 09/26/2016  . Carpal tunnel syndrome 06/25/2016  . Hereditary and idiopathic peripheral neuropathy 06/25/2016  . Paresthesia 05/27/2016  . Abnormality of gait 05/27/2016  . Constipation 06/13/2015  . Encounter for therapeutic drug monitoring 05/25/2014  . Syncope 04/03/2014  . Low back pain 04/19/2013  . Melanoma ([place]) 09/30/2012  . Cough 03/26/2012  . Hx of mitral valve repair 11/19/2011  . Long term (current) use of anticoagulants 11/03/2011  . Pleural effusion due to congestive heart failure ([place]) 10/28/2011  . Atrial fibrillation ([place]) 10/07/2011  . S/P mitral valve repair 10/01/2011  . Valvular heart disease 08/21/2011  . Hearing loss 04/09/2011  . Thrombocytopenia ([place]) 09/19/2010  . ADENOCARCINOMA, PROSTATE 09/17/2010  . CALLUS, LEFT FOOT 09/17/2010  . Backache 09/17/2010  . TINEA PEDIS 05/28/2009  . DERMATITIS, ATOPIC 04/10/2009  . HYPERCHOLESTEROLEMIA 06/09/2008  . DEPRESSION 06/09/2008  . HYPERTENSION, BENIGN ESSENTIAL 06/09/2008  . GERD 06/09/2008  . OSTEOARTHRITIS, GENERALIZED, MULTIPLE JOINTS 06/09/2008  . MUSCLE SPASM, BACK  06/09/2008  . H/O prostate cancer 06/09/2008  . PERSONAL HISTORY OF MALIGNANT MELANOMA OF SKIN 06/09/2008  . ARRHYTHMIA, HX OF 06/09/2008  . Personal history of colonic polyps 06/09/2008   Past Medical History:  Diagnosis Date  . Abnormality of gait 05/27/2016  . Adenomatous polyps   . Carpal tunnel syndrome 06/25/2016   Right  . Depressive disorder, not elsewhere classified   . First degree AV block    Holter 3/18: NSR, PACs, PVCs, no AFib, no pauses.  Marland Kitchen Hereditary and idiopathic peripheral neuropathy 06/25/2016  . Hypertension   . Internal nasal lesion 05/15/2013  . Melanoma ([place])    Left Shoulder  . Mitral regurgitation   . MVP (mitral valve prolapse)    a. With severe MR s/p Complex valvuloplasty including artificial Gore-tex neochord placement x4, chordal transposition x1, chordal release x1, # 32 mm Sorin Memo 3D Ring Annuloplasty 2012. // b.  Echo 2/18: mild LVH, EF 50-55, mild AI, MV repair with mild MR, mod LAE, mod RVE, severe RAE, severe TR  . Neuropathy   . Normal coronary arteries    a. Normal coronary anatomy by cath 2012.  . Osteoarthritis    Knees  . PAF (paroxysmal atrial fibrillation) ([place])    a. Post-op MVR 2012.  Marland Kitchen Personal history of colonic polyps   . Prostate cancer ([place])   . Pulmonary HTN ([place])    a. Mild-mod by cath 2012.  . Pure hypercholesterolemia   . PVC (premature ventricular contraction)   . Thrombocytopenia ([place])   . Vision abnormalities    Cornea scarring    Family History  Problem Relation Age of Onset  . Clotting disorder Brother        CVA's  . Arthritis Mother   . Hypertension Mother   . Stroke Mother   . Hypertension Father   . Psychosis Father        psychiatric care  . Colon cancer Neg Hx   . Stomach cancer Neg Hx   . Heart attack Neg Hx   . Prostate cancer Neg Hx   . Pancreatic cancer Neg Hx     Past Surgical History:  Procedure Laterality Date  . CARDIAC CATHETERIZATION  09/2011   Pre-op for MVR -- normal coronaries.  Marland Kitchen  CARDIOVERSION N/A 01/02/2016   Procedure: CARDIOVERSION;  Surgeon: Thayer Headings, MD;  Location: [facility] ENDOSCOPY;  Service: Cardiovascular;  Laterality: N/A;  . COLONOSCOPY W/ POLYPECTOMY    . INGUINAL HERNIA REPAIR  09/2009   Left  . KNEE ARTHROSCOPY      left x3  and right x2  . Melanoma Surgery     2001, 2005, 2006, 2009  . MITRAL VALVE REPAIR  10/01/2011   complex valvuloplasty with Goretex cord replacement and chordal transposition 10mm Sorin Memo 3D ring annuloplasty  . Nuclear Stress Test  09/2006   EF-64%, Normal  . PROSTATECTOMY  1993  . RIGHT HEART CATH N/A 04/29/2017   Procedure: Right Heart Cath;  Surgeon: Jolaine Artist, MD;  Location: [facility] LAB;  Service: Cardiovascular;  Laterality: N/A;  . ROOT CANAL  08-19-12  . ROTATOR CUFF REPAIR  2003   left  . TEE WITHOUT CARDIOVERSION  09/26/2011   Procedure: TRANSESOPHAGEAL ECHOCARDIOGRAM (TEE);  Surgeon: Lelon Perla, MD;  Location: [facility] ENDOSCOPY;  Service: Cardiovascular;  Laterality: N/A;  . US ECHOCARDIOGRAPHY  09/2009, 08/1011   mild LVH,mild AI,MVP with mild MR, mild-mod. TR with mild Pulm. HTN, EF-55-60%   Social History   Occupational History    Comment: retired  Tobacco Use  . Smoking status: Never Smoker  . Smokeless tobacco: Never Used  Vaping Use  . Vaping Use: Never used  Substance and Sexual Activity  . Alcohol use: No    Alcohol/week: 0.0 standard drinks    Comment: Last drink in 2000  . Drug use: No  . Sexual activity: Not Currently

## 2021-03-20 ENCOUNTER — Ambulatory Visit (INDEPENDENT_AMBULATORY_CARE_PROVIDER_SITE_OTHER): Payer: Medicare Other

## 2021-03-20 DIAGNOSIS — Z5181 Encounter for therapeutic drug level monitoring: Secondary | ICD-10-CM | POA: Diagnosis not present

## 2021-03-20 DIAGNOSIS — I4891 Unspecified atrial fibrillation: Secondary | ICD-10-CM | POA: Diagnosis not present

## 2021-03-20 LAB — POCT INR: INR: 3.5 — AB (ref 2.0–3.0)

## 2021-03-20 NOTE — Patient Instructions (Signed)
Description   Spoke with Randell Patient, RN with [facility] while in home with pt and advised to have pt skip today's dosage of Warfarin, then take 1/2 tablet daily while pt is on Doxycycline 100mg  BID x 10 days started on 03/16/21 (should complete 03/26/21), until recheck on Monday by [facility] [phone]). Call [phone] if scheduled for any procedures or on any new medications

## 2021-03-22 ENCOUNTER — Encounter: Payer: Self-pay | Admitting: Medical

## 2021-03-22 ENCOUNTER — Other Ambulatory Visit: Payer: Self-pay

## 2021-03-22 ENCOUNTER — Ambulatory Visit (INDEPENDENT_AMBULATORY_CARE_PROVIDER_SITE_OTHER): Payer: Medicare Other | Admitting: Medical

## 2021-03-22 ENCOUNTER — Telehealth: Payer: Self-pay | Admitting: Family Medicine

## 2021-03-22 VITALS — BP 125/66 | HR 76 | Resp 20 | Ht 76.0 in | Wt 173.0 lb

## 2021-03-22 DIAGNOSIS — S91302A Unspecified open wound, left foot, initial encounter: Secondary | ICD-10-CM

## 2021-03-22 DIAGNOSIS — T148XXA Other injury of unspecified body region, initial encounter: Secondary | ICD-10-CM | POA: Diagnosis not present

## 2021-03-22 DIAGNOSIS — L089 Local infection of the skin and subcutaneous tissue, unspecified: Secondary | ICD-10-CM | POA: Diagnosis not present

## 2021-03-22 NOTE — Patient Instructions (Signed)
Your left lower ext ulceration/non healing wound no improving with doxycycline even though studies indicate bacteria is sensitive the antibiotic. 3 month of nonhealing wound so placed referral to wound care. I am trying to get you in within a week.  Continue on doxycycline antibiotic and follow up with coumadin clinic as well.  Your area on rt foot that looked infected now appears improved/resolved.  Follow up one week with me if appointment to wound care delayed. Also follow up as needed

## 2021-03-22 NOTE — Telephone Encounter (Signed)
Verbal.

## 2021-03-22 NOTE — Progress Notes (Signed)
Subjective:    Patient ID: [name] [name], male    DOB: 07/13/1931, 87 y.o.   MRN: [id number]  HPI  Pt in for follow up on left heel area. Pt had wound present for about 3 months know. Has been on doxycycline. Sensitivity and wound culture came back showing staph and sensitive to doxycycline.   Pt also had rt foot area concern for cellulitis. This area better after antibiotic.  Ortho saw pt and gave steroid injection for osteoarthritis and no tx needed for baker cyst. US done before showed no dvt.  Pt was on coumadin. 3.5. they cut back on the warfarin. Appointment with them next week.     Review of Systems  Constitutional: Negative for chills, fatigue and fever.  Respiratory: Negative for cough, chest tightness, shortness of breath and wheezing.   Cardiovascular: Negative for chest pain and palpitations.  Gastrointestinal: Negative for abdominal pain and blood in stool.  Genitourinary: Negative for dysuria, flank pain and frequency.  Musculoskeletal: Negative for back pain, joint swelling and neck stiffness.  Skin: Negative for rash.  Neurological: Negative for dizziness, speech difficulty, weakness, light-headedness and numbness.  Hematological: Negative for adenopathy. Does not bruise/bleed easily.  Psychiatric/Behavioral: Negative for behavioral problems, confusion, dysphoric mood and sleep disturbance. The patient is not nervous/anxious.    Past Medical History:  Diagnosis Date  . Abnormality of gait 05/27/2016  . Adenomatous polyps   . Carpal tunnel syndrome 06/25/2016   Right  . Depressive disorder, not elsewhere classified   . First degree AV block    Holter 3/18: NSR, PACs, PVCs, no AFib, no pauses.  Marland Kitchen Hereditary and idiopathic peripheral neuropathy 06/25/2016  . Hypertension   . Internal nasal lesion 05/15/2013  . Melanoma ([place])    Left Shoulder  . Mitral regurgitation   . MVP (mitral valve prolapse)    a. With severe MR s/p Complex valvuloplasty including  artificial Gore-tex neochord placement x4, chordal transposition x1, chordal release x1, # 32 mm Sorin Memo 3D Ring Annuloplasty 2012. // b. Echo 2/18: mild LVH, EF 50-55, mild AI, MV repair with mild MR, mod LAE, mod RVE, severe RAE, severe TR  . Neuropathy   . Normal coronary arteries    a. Normal coronary anatomy by cath 2012.  . Osteoarthritis    Knees  . PAF (paroxysmal atrial fibrillation) ([place])    a. Post-op MVR 2012.  Marland Kitchen Personal history of colonic polyps   . Prostate cancer ([place])   . Pulmonary HTN ([place])    a. Mild-mod by cath 2012.  . Pure hypercholesterolemia   . PVC (premature ventricular contraction)   . Thrombocytopenia ([place])   . Vision abnormalities    Cornea scarring     Social History   Socioeconomic History  . Marital status: Widowed    Spouse name: Not on file  . Number of children: 2  . Years of education: 63  . Highest education level: Not on file  Occupational History    Comment: retired  Tobacco Use  . Smoking status: Never Smoker  . Smokeless tobacco: Never Used  Vaping Use  . Vaping Use: Never used  Substance and Sexual Activity  . Alcohol use: No    Alcohol/week: 0.0 standard drinks    Comment: Last drink in 2000  . Drug use: No  . Sexual activity: Not Currently  Other Topics Concern  . Not on file  Social History Narrative   Retired - Optometrist   Widower   2 children (  one in [place] and on one in [place])    Drinks 1 cup of coffee per day   Social Determinants of Health   Financial Resource Strain: Low Risk   . Difficulty of Paying Living Expenses: Not hard at all  Food Insecurity: No Food Insecurity  . Worried About Charity fundraiser in the Last Year: Never true  . Ran Out of Food in the Last Year: Never true  Transportation Needs: No Transportation Needs  . Lack of Transportation (Medical): No  . Lack of Transportation (Non-Medical): No  Physical Activity: Not on file  Stress: Not on file  Social Connections: Not on file   Intimate Partner Violence: Not on file    Past Surgical History:  Procedure Laterality Date  . CARDIAC CATHETERIZATION  09/2011   Pre-op for MVR -- normal coronaries.  Marland Kitchen CARDIOVERSION N/A 01/02/2016   Procedure: CARDIOVERSION;  Surgeon: Thayer Headings, MD;  Location: [facility] ENDOSCOPY;  Service: Cardiovascular;  Laterality: N/A;  . COLONOSCOPY W/ POLYPECTOMY    . INGUINAL HERNIA REPAIR  09/2009   Left  . KNEE ARTHROSCOPY      left x3  and right x2  . Melanoma Surgery     2001, 2005, 2006, 2009  . MITRAL VALVE REPAIR  10/01/2011   complex valvuloplasty with Goretex cord replacement and chordal transposition 73mm Sorin Memo 3D ring annuloplasty  . Nuclear Stress Test  09/2006   EF-64%, Normal  . PROSTATECTOMY  1993  . RIGHT HEART CATH N/A 04/29/2017   Procedure: Right Heart Cath;  Surgeon: Jolaine Artist, MD;  Location: [place] CV LAB;  Service: Cardiovascular;  Laterality: N/A;  . ROOT CANAL  08-19-12  . ROTATOR CUFF REPAIR  2003   left  . TEE WITHOUT CARDIOVERSION  09/26/2011   Procedure: TRANSESOPHAGEAL ECHOCARDIOGRAM (TEE);  Surgeon: Lelon Perla, MD;  Location: [facility] ENDOSCOPY;  Service: Cardiovascular;  Laterality: N/A;  . US ECHOCARDIOGRAPHY  09/2009, 08/1011   mild LVH,mild AI,MVP with mild MR, mild-mod. TR with mild Pulm. HTN, EF-55-60%    Family History  Problem Relation Age of Onset  . Clotting disorder Brother        CVA's  . Arthritis Mother   . Hypertension Mother   . Stroke Mother   . Hypertension Father   . Psychosis Father        psychiatric care  . Colon cancer Neg Hx   . Stomach cancer Neg Hx   . Heart attack Neg Hx   . Prostate cancer Neg Hx   . Pancreatic cancer Neg Hx     No Known Allergies  Current Outpatient Medications on File Prior to Visit  Medication Sig Dispense Refill  . acetaminophen (TYLENOL) 500 MG tablet Take 1,000 mg by mouth every 6 (six) hours as needed for mild pain.    Marland Kitchen amiodarone (PACERONE) 200 MG tablet Take 0.5  tablets (100 mg total) by mouth daily. 90 tablet 0  . b complex vitamins tablet Take 1 tablet by mouth daily.    . cycloSPORINE (RESTASIS) 0.05 % ophthalmic emulsion Place 1 drop into both eyes 2 (two) times daily.    Marland Kitchen doxycycline (VIBRA-TABS) 100 MG tablet Take 1 tablet (100 mg total) by mouth 2 (two) times daily. 20 tablet 0  . fluticasone (FLONASE) 50 MCG/ACT nasal spray Place 2 sprays into both nostrils daily.   11  . gabapentin (NEURONTIN) 300 MG capsule TAKE 1 CAPSULE BY MOUTH  TWICE DAILY 180 capsule 3  .  Multiple Vitamin (MULTIVITAMIN WITH MINERALS) TABS tablet Take 1 tablet by mouth every evening.     . Neo-Bacit-Poly-Lidocaine (TRIPLE ANTIBIOTIC MAX ST EX) Apply topically.    . potassium chloride SA (KLOR-CON M20) 20 MEQ tablet Take 2 tablets (40 mEq total) by mouth daily. 180 tablet 3  . rosuvastatin (CRESTOR) 5 MG tablet TAKE 1 TABLET BY MOUTH  DAILY 90 tablet 3  . torsemide (DEMADEX) 20 MG tablet TAKE 2 TABLETS BY MOUTH  DAILY 180 tablet 3  . traZODone (DESYREL) 100 MG tablet Take 200 mg by mouth at bedtime.    Marland Kitchen warfarin (COUMADIN) 5 MG tablet TAKE 1 TABLET BY MOUTH  DAILY AS DIRECTED BY THE  COUMADIN CLINIC 90 tablet 3  . [DISCONTINUED] pantoprazole (PROTONIX) 40 MG tablet Take 1 tablet (40 mg total) by mouth daily before breakfast.     No current facility-administered medications on file prior to visit.    BP 125/66   Pulse 76   Resp 20   Ht 6\' 4"  (1.93 m)   Wt 173 lb (78.5 kg)   SpO2 98%   BMI 21.06 kg/m       Objective:   Physical Exam  General Mental Status- Alert. General Appearance- Not in acute distress.   Skin General: Color- Normal Color. Moisture- Normal Moisture.  Neck Carotid Arteries- Normal color. Moisture- Normal Moisture. No carotid bruits. No JVD.  Chest and Lung Exam Auscultation: Breath Sounds:-Normal.  Cardiovascular Auscultation:Rythm- Regular. Murmurs & Other Heart Sounds:Auscultation of the heart reveals- No  Murmurs.  Abdomen Inspection:-Inspeection Normal. Palpation/Percussion:Note:No mass. Palpation and Percussion of the abdomen reveal- Non Tender, Non Distended + BS, no rebound or guarding.   Neurologic Cranial Nerve exam:- CN III-XII intact(No nystagmus), symmetric smile. Strength:- 5/5 equal and symmetric strength both upper and lower extremities.  Left foot- heel still has open nonhealing wound.  Looks like it is filled and minimally.    Right foot- top aspect of foot previously tender and little swollen now not tender or swollen.  Right lower extremity- no obvious swelling but he does have some obvious dilated varicose veins.  Negative Homans' sign.(Note recent formal ultrasound negative for DVT)     Assessment & Plan:  Your left lower ext ulceration/non healing wound no improving with doxycycline even though studies indicate bacteria is sensitive the antibiotic. 3 month of nonhealing wound so placed referral to wound care. I am trying to get you in within a week.  Continue on doxycycline antibiotic and follow up with coumadin clinic as well.  Your area on rt foot that looked infected now appears improved/resolved.  Follow up one week with me if appointment to wound care delayed. Also follow up as needed

## 2021-03-22 NOTE — Telephone Encounter (Signed)
Caller: [place]   Call Back @ [phone]  Per Lennette Bihari, Patient had a missed visit today. Patient had other dr. appointments scheduled today.  Lennette Bihari would like to know if he cloud see patient next week?   Please advise

## 2021-03-25 ENCOUNTER — Telehealth: Payer: Self-pay

## 2021-03-25 ENCOUNTER — Telehealth: Payer: Self-pay | Admitting: Medical

## 2021-03-25 ENCOUNTER — Ambulatory Visit (INDEPENDENT_AMBULATORY_CARE_PROVIDER_SITE_OTHER): Payer: Medicare Other | Admitting: *Deleted

## 2021-03-25 ENCOUNTER — Telehealth: Payer: Self-pay | Admitting: Family Medicine

## 2021-03-25 DIAGNOSIS — Z5181 Encounter for therapeutic drug level monitoring: Secondary | ICD-10-CM

## 2021-03-25 DIAGNOSIS — I4891 Unspecified atrial fibrillation: Secondary | ICD-10-CM | POA: Diagnosis not present

## 2021-03-25 LAB — POCT INR: INR: 3.4 — AB (ref 2.0–3.0)

## 2021-03-25 MED ORDER — DOXYCYCLINE HYCLATE 100 MG PO TABS: 100.0000 mg | ORAL_TABLET | Freq: Two times a day (BID) | ORAL | 0 refills | Status: DC

## 2021-03-25 NOTE — Telephone Encounter (Signed)
[facility], their next available appt is in 3 weeks. Patient would like a referral to the [facility] instead he is hoping for a sooner appt. Patient was seeing by Percell Miller

## 2021-03-25 NOTE — Telephone Encounter (Signed)
Will you refer to differnent wound care center. [facility].

## 2021-03-25 NOTE — Telephone Encounter (Signed)
Pt called with questions stated he wanted to know if you were going to send in another refill on the doxy since on the AVS you stated " continue taking doxy " and he is on his last pill today. Wound is still sore and cant tell if its draining or has an odor since the wound is wrapped up

## 2021-03-25 NOTE — Telephone Encounter (Signed)
Thanks for sending new referral in.

## 2021-03-25 NOTE — Telephone Encounter (Signed)
Sent in doxycycline rx. Was pt scheduled for wound care this week?

## 2021-03-25 NOTE — Telephone Encounter (Signed)
Called pt and lvm to return call.  

## 2021-03-25 NOTE — Telephone Encounter (Signed)
Rx doxy sent to pt pharmacy. 

## 2021-03-26 NOTE — Telephone Encounter (Signed)
Pt notified of medication and patient doesn't see woundc are until 04/05/21. Patient is still keeping follow up appointment for 03/28/21

## 2021-03-26 NOTE — Telephone Encounter (Signed)
Ok. Will see him on 03/28/2021

## 2021-03-28 ENCOUNTER — Ambulatory Visit (INDEPENDENT_AMBULATORY_CARE_PROVIDER_SITE_OTHER): Payer: Medicare Other | Admitting: Medical

## 2021-03-28 ENCOUNTER — Ambulatory Visit (INDEPENDENT_AMBULATORY_CARE_PROVIDER_SITE_OTHER): Payer: Medicare Other | Admitting: Cardiology

## 2021-03-28 ENCOUNTER — Other Ambulatory Visit: Payer: Self-pay

## 2021-03-28 VITALS — BP 110/59 | HR 78 | Resp 18 | Ht 76.0 in | Wt 173.0 lb

## 2021-03-28 DIAGNOSIS — B49 Unspecified mycosis: Secondary | ICD-10-CM | POA: Diagnosis not present

## 2021-03-28 DIAGNOSIS — Z5181 Encounter for therapeutic drug level monitoring: Secondary | ICD-10-CM | POA: Diagnosis not present

## 2021-03-28 DIAGNOSIS — D696 Thrombocytopenia, unspecified: Secondary | ICD-10-CM | POA: Diagnosis not present

## 2021-03-28 DIAGNOSIS — L089 Local infection of the skin and subcutaneous tissue, unspecified: Secondary | ICD-10-CM

## 2021-03-28 DIAGNOSIS — T148XXD Other injury of unspecified body region, subsequent encounter: Secondary | ICD-10-CM

## 2021-03-28 DIAGNOSIS — T148XXA Other injury of unspecified body region, initial encounter: Secondary | ICD-10-CM | POA: Diagnosis not present

## 2021-03-28 LAB — CBC WITH DIFFERENTIAL/PLATELET
Basophils Absolute: 0.1 10*3/uL (ref 0.0–0.1)
Basophils Relative: 0.7 % (ref 0.0–3.0)
Eosinophils Absolute: 0 10*3/uL (ref 0.0–0.7)
Eosinophils Relative: 0.2 % (ref 0.0–5.0)
HCT: 39.3 % (ref 39.0–52.0)
Hemoglobin: 13.1 g/dL (ref 13.0–17.0)
Lymphocytes Relative: 13.7 % (ref 12.0–46.0)
Lymphs Abs: 1.2 10*3/uL (ref 0.7–4.0)
MCHC: 33.3 g/dL (ref 30.0–36.0)
MCV: 85.8 fl (ref 78.0–100.0)
Monocytes Absolute: 1.9 10*3/uL — ABNORMAL HIGH (ref 0.1–1.0)
Monocytes Relative: 22.1 % — ABNORMAL HIGH (ref 3.0–12.0)
Neutro Abs: 5.5 10*3/uL (ref 1.4–7.7)
Neutrophils Relative %: 63.5 % (ref 43.0–77.0)
Platelets: 80 10*3/uL — ABNORMAL LOW (ref 150.0–400.0)
RBC: 4.58 Mil/uL (ref 4.22–5.81)
RDW: 16 % — ABNORMAL HIGH (ref 11.5–15.5)
WBC: 8.7 10*3/uL (ref 4.0–10.5)

## 2021-03-28 LAB — POCT INR: INR: 2.3 (ref 2.0–3.0)

## 2021-03-28 MED ORDER — NYSTATIN 100000 UNIT/GM EX CREA: 1.0000 "application " | TOPICAL_CREAM | Freq: Two times a day (BID) | CUTANEOUS | 1 refills | Status: DC

## 2021-03-28 NOTE — Patient Instructions (Signed)
Description   Spoke with Randell Patient, RN with [facility] while in home with pt and advised for pt to continue taking Warfarin 1/2 tablet daily while pt is on Doxycycline. Instructed for pt to eat an extra serving of greens over the weekend. Recheck  INR on 5/16 by [facility] [phone]).   Pt on Doxy 100mg  BID x 10 days and extended another 7 days (Started on 03/16/21-should complete 04/02/21). Call [phone] if scheduled for any procedures or on any new medications

## 2021-03-28 NOTE — Patient Instructions (Addendum)
Persistent slow healing wound left heel.  Advised continue doxycycline antibiotic, wound care nurse evaluating patient today and next Friday we will see to wound care clinic/MD.  Today placed placed order for ABI studies.  Asking if study can get done on Tuesday morning.  Skin rash area left buttocks and inguinal fold region.  Patient does wear depends.  Sometimes sits and slightly moist depends per family.  Based on location and clinical history considered fungal infection.  Consider prescribing Diflucan or Lamisil but on attempted prescribing interaction warning regarding amiodarone.  Also not ideal but decided to prescribe nystatin cream instead.  Patient is going to see dermatologist on Monday for other condition.  Hopefully he can get some advice regarding buttocks and groin rash as well.  On review patient does have low platelets.  Expressed concern of  Daughter for possible bruising  so went ahead and got repeat CBC today.  Follow-up date to be determined after ABI review, CBC review and wound care consult.  But to keep follow-up appointment with PCP as regularly scheduled.

## 2021-03-28 NOTE — Progress Notes (Signed)
Subjective:    Patient ID: [name] [name], male    DOB: [birth year], 87 y.o.   MRN: [id number]  HPI  Pt in for follow up.  Pt has appointment scheduled with wound care a week from tomorrow.   I had tried to get scheduled sooner but was delayed so got other practice to see pt sooner.  No fever, no chills or sweats. Pt has wound care nurse coming  Today to see pt..  Also some left side buttox pink rash that is extending to groin area. Pt shows me pictures.  Pictures show pinkish rash scattered left buttocks posterior upper thigh and inguinal folds.   Review of Systems  Constitutional: Negative for chills and fatigue.  Respiratory: Negative for cough, choking and chest tightness.   Cardiovascular: Negative for chest pain and palpitations.  Gastrointestinal: Negative for abdominal pain.  Musculoskeletal:       See lower extremity description on physical exam and HPI.  Skin: Positive for rash.       See HPI.  Hematological: Negative for adenopathy. Does not bruise/bleed easily.    Past Medical History:  Diagnosis Date  . Abnormality of gait 05/27/2016  . Adenomatous polyps   . Carpal tunnel syndrome 06/25/2016   Right  . Depressive disorder, not elsewhere classified   . First degree AV block    Holter 3/18: NSR, PACs, PVCs, no AFib, no pauses.  Marland Kitchen Hereditary and idiopathic peripheral neuropathy 06/25/2016  . Hypertension   . Internal nasal lesion 05/15/2013  . Melanoma ([place])    Left Shoulder  . Mitral regurgitation   . MVP (mitral valve prolapse)    a. With severe MR s/p Complex valvuloplasty including artificial Gore-tex neochord placement x4, chordal transposition x1, chordal release x1, # 32 mm Sorin Memo 3D Ring Annuloplasty 2012. // b. Echo 2/18: mild LVH, EF 50-55, mild AI, MV repair with mild MR, mod LAE, mod RVE, severe RAE, severe TR  . Neuropathy   . Normal coronary arteries    a. Normal coronary anatomy by cath 2012.  . Osteoarthritis    Knees  . PAF (paroxysmal  atrial fibrillation) ([place])    a. Post-op MVR 2012.  Marland Kitchen Personal history of colonic polyps   . Prostate cancer ([place])   . Pulmonary HTN ([place])    a. Mild-mod by cath 2012.  . Pure hypercholesterolemia   . PVC (premature ventricular contraction)   . Thrombocytopenia ([place])   . Vision abnormalities    Cornea scarring     Social History   Socioeconomic History  . Marital status: Widowed    Spouse name: Not on file  . Number of children: 2  . Years of education: 41  . Highest education level: Not on file  Occupational History    Comment: retired  Tobacco Use  . Smoking status: Never Smoker  . Smokeless tobacco: Never Used  Vaping Use  . Vaping Use: Never used  Substance and Sexual Activity  . Alcohol use: No    Alcohol/week: 0.0 standard drinks    Comment: Last drink in 2000  . Drug use: No  . Sexual activity: Not Currently  Other Topics Concern  . Not on file  Social History Narrative   Retired - Optometrist   Widower   2 children (one in [place] and on one in [place])    Drinks 1 cup of coffee per day   Social Determinants of Radio broadcast assistant Strain: Low Risk   . Difficulty  of Paying Living Expenses: Not hard at all  Food Insecurity: No Food Insecurity  . Worried About Charity fundraiser in the Last Year: Never true  . Ran Out of Food in the Last Year: Never true  Transportation Needs: No Transportation Needs  . Lack of Transportation (Medical): No  . Lack of Transportation (Non-Medical): No  Physical Activity: Not on file  Stress: Not on file  Social Connections: Not on file  Intimate Partner Violence: Not on file    Past Surgical History:  Procedure Laterality Date  . CARDIAC CATHETERIZATION  09/2011   Pre-op for MVR -- normal coronaries.  Marland Kitchen CARDIOVERSION N/A 01/02/2016   Procedure: CARDIOVERSION;  Surgeon: Thayer Headings, MD;  Location: [facility] ENDOSCOPY;  Service: Cardiovascular;  Laterality: N/A;  . COLONOSCOPY W/ POLYPECTOMY    . INGUINAL HERNIA  REPAIR  09/2009   Left  . KNEE ARTHROSCOPY      left x3  and right x2  . Melanoma Surgery     2001, 2005, 2006, 2009  . MITRAL VALVE REPAIR  10/01/2011   complex valvuloplasty with Goretex cord replacement and chordal transposition 63mm Sorin Memo 3D ring annuloplasty  . Nuclear Stress Test  09/2006   EF-64%, Normal  . PROSTATECTOMY  1993  . RIGHT HEART CATH N/A 04/29/2017   Procedure: Right Heart Cath;  Surgeon: Jolaine Artist, MD;  Location: [place] CV LAB;  Service: Cardiovascular;  Laterality: N/A;  . ROOT CANAL  08-19-12  . ROTATOR CUFF REPAIR  2003   left  . TEE WITHOUT CARDIOVERSION  09/26/2011   Procedure: TRANSESOPHAGEAL ECHOCARDIOGRAM (TEE);  Surgeon: Lelon Perla, MD;  Location: [facility] ENDOSCOPY;  Service: Cardiovascular;  Laterality: N/A;  . US ECHOCARDIOGRAPHY  09/2009, 08/1011   mild LVH,mild AI,MVP with mild MR, mild-mod. TR with mild Pulm. HTN, EF-55-60%    Family History  Problem Relation Age of Onset  . Clotting disorder Brother        CVA's  . Arthritis Mother   . Hypertension Mother   . Stroke Mother   . Hypertension Father   . Psychosis Father        psychiatric care  . Colon cancer Neg Hx   . Stomach cancer Neg Hx   . Heart attack Neg Hx   . Prostate cancer Neg Hx   . Pancreatic cancer Neg Hx     No Known Allergies  Current Outpatient Medications on File Prior to Visit  Medication Sig Dispense Refill  . acetaminophen (TYLENOL) 500 MG tablet Take 1,000 mg by mouth every 6 (six) hours as needed for mild pain.    Marland Kitchen amiodarone (PACERONE) 200 MG tablet Take 0.5 tablets (100 mg total) by mouth daily. 90 tablet 0  . b complex vitamins tablet Take 1 tablet by mouth daily.    . cycloSPORINE (RESTASIS) 0.05 % ophthalmic emulsion Place 1 drop into both eyes 2 (two) times daily.    Marland Kitchen doxycycline (VIBRA-TABS) 100 MG tablet Take 1 tablet (100 mg total) by mouth 2 (two) times daily. 14 tablet 0  . fluticasone (FLONASE) 50 MCG/ACT nasal spray Place 2  sprays into both nostrils daily.   11  . gabapentin (NEURONTIN) 300 MG capsule TAKE 1 CAPSULE BY MOUTH  TWICE DAILY 180 capsule 3  . Multiple Vitamin (MULTIVITAMIN WITH MINERALS) TABS tablet Take 1 tablet by mouth every evening.     . Neo-Bacit-Poly-Lidocaine (TRIPLE ANTIBIOTIC MAX ST EX) Apply topically.    . potassium chloride SA (KLOR-CON  M20) 20 MEQ tablet Take 2 tablets (40 mEq total) by mouth daily. 180 tablet 3  . rosuvastatin (CRESTOR) 5 MG tablet TAKE 1 TABLET BY MOUTH  DAILY 90 tablet 3  . torsemide (DEMADEX) 20 MG tablet TAKE 2 TABLETS BY MOUTH  DAILY 180 tablet 3  . traZODone (DESYREL) 100 MG tablet Take 200 mg by mouth at bedtime.    Marland Kitchen warfarin (COUMADIN) 5 MG tablet TAKE 1 TABLET BY MOUTH  DAILY AS DIRECTED BY THE  COUMADIN CLINIC 90 tablet 3  . [DISCONTINUED] pantoprazole (PROTONIX) 40 MG tablet Take 1 tablet (40 mg total) by mouth daily before breakfast.     No current facility-administered medications on file prior to visit.    BP (!) 110/59   Pulse 78   Resp 18   Ht 6\' 4"  (1.93 m)   Wt 173 lb (78.5 kg)   SpO2 98%   BMI 21.06 kg/m      Objective:   Physical Exam   General- No acute distress. Pleasant patient. Neck- Full range of motion, no jvd Lungs- Clear, even and unlabored. Heart- regular rate and rhythm. Neurologic- CNII- XII grossly intact. Right heel- small superficial breakdown of skin.  This area looks a little worse than last time.  Left foot- heel still has open nonhealing wound.  Looks like it is filled and minimally.  Skin- mild pinkish rash left buttocks, posterior upper leg and inguinal fold.  The buttocks region is not tender to palpation or warm.  No breakdown of skin.     Assessment & Plan:  Persistent slow healing wound left heel.  Advised continue doxycycline antibiotic, wound care nurse evaluating patient today and next Friday we will see to wound care clinic/MD.  Today placed placed order for ABI studies.  Asking if study can get done on  Tuesday morning.  Skin rash area left buttocks and inguinal fold region.  Patient does wear depends.  Sometimes sits and slightly moist depends per family.  Based on location and clinical history considered fungal infection.  Consider prescribing Diflucan or Lamisil but on attempted prescribing interaction warning regarding amiodarone.  Also not ideal but decided to prescribe nystatin cream instead.  Patient is going to see dermatologist on Monday for other condition.  Hopefully he can get some advice regarding buttocks and groin rash as well.  On review patient does have low platelets.  Expressed concern of  Daughter for possible bruising  so went ahead and got repeat CBC today.  Follow-up date to be determined after ABI review, CBC review and wound care consult.  But to keep follow-up appointment with PCP as regularly scheduled.  Mackie Pai, PA-C   Time spent with patient today was 31  minutes which consisted of chart review, discussing diagnosis, work up treatment and documentation.

## 2021-03-29 ENCOUNTER — Ambulatory Visit: Payer: Medicare Other | Admitting: Medical

## 2021-04-01 ENCOUNTER — Ambulatory Visit (INDEPENDENT_AMBULATORY_CARE_PROVIDER_SITE_OTHER): Payer: Medicare Other

## 2021-04-01 ENCOUNTER — Telehealth: Payer: Self-pay | Admitting: *Deleted

## 2021-04-01 DIAGNOSIS — I4811 Longstanding persistent atrial fibrillation: Secondary | ICD-10-CM | POA: Diagnosis not present

## 2021-04-01 DIAGNOSIS — Z5181 Encounter for therapeutic drug level monitoring: Secondary | ICD-10-CM | POA: Diagnosis not present

## 2021-04-01 LAB — POCT INR: INR: 3.3 — AB (ref 2.0–3.0)

## 2021-04-01 NOTE — Patient Instructions (Signed)
-   Spoke with Lenna Sciara, RN with [facility] while in home with pt and advised for pt to skip warfarin tonight, then continue taking Warfarin 1/2 tablet daily while pt is on terbinafine x 10 days.. - Recheck  INR in 1 week by [facility] [phone]).    Call [phone] if scheduled for any procedures or on any new medications

## 2021-04-01 NOTE — Telephone Encounter (Signed)
235pm Patient called and stated that he has not been scheduled for [place].  Advised that I would find out from referrals and call him back.  240pm Spoke with referrals and they sent message to Butch Penny in vas dept and they stated that they will give him a call.  255pm Spoke with patient and asked if anyone has called him yet and he stated no.  I advised him they will be calling him soon to get him set up.

## 2021-04-03 ENCOUNTER — Other Ambulatory Visit: Payer: Self-pay

## 2021-04-03 ENCOUNTER — Ambulatory Visit (HOSPITAL_COMMUNITY)
Admission: RE | Admit: 2021-04-03 | Discharge: 2021-04-03 | Disposition: A | Discharge status: Home / Self Care | Payer: Medicare Other | Source: Ambulatory Visit | Attending: Medical | Admitting: Medical

## 2021-04-03 DIAGNOSIS — T148XXA Other injury of unspecified body region, initial encounter: Secondary | ICD-10-CM | POA: Insufficient documentation

## 2021-04-03 DIAGNOSIS — L089 Local infection of the skin and subcutaneous tissue, unspecified: Secondary | ICD-10-CM | POA: Insufficient documentation

## 2021-04-03 DIAGNOSIS — T148XXD Other injury of unspecified body region, subsequent encounter: Secondary | ICD-10-CM | POA: Insufficient documentation

## 2021-04-03 NOTE — Progress Notes (Signed)
ABI exam has been completed.  Results can be found under chart review under CV PROC. 04/03/2021 11:17 AM Alixandrea Milleson RVT, RDMS

## 2021-04-04 ENCOUNTER — Telehealth: Payer: Self-pay | Admitting: Medical

## 2021-04-04 DIAGNOSIS — R600 Localized edema: Secondary | ICD-10-CM

## 2021-04-04 NOTE — Telephone Encounter (Signed)
Referral to vascular surgeon placed. 

## 2021-04-05 ENCOUNTER — Telehealth: Payer: Self-pay | Admitting: Medical

## 2021-04-05 ENCOUNTER — Telehealth: Payer: Self-pay | Admitting: *Deleted

## 2021-04-05 MED ORDER — DOXYCYCLINE HYCLATE 100 MG PO TABS: 100.0000 mg | ORAL_TABLET | Freq: Two times a day (BID) | ORAL | 0 refills | Status: DC

## 2021-04-05 NOTE — Telephone Encounter (Signed)
FYI Spoke with patient and advised of note below.  He stated that he canceled that wound care appointment because he is having pain in his leg and cannot hardly walk.  He rescheduled it for the end of May.  I gave him the phone number to [facility] and he will call them today to get scheduled (he has been to them in past about 4 years ago per patient).

## 2021-04-05 NOTE — Telephone Encounter (Signed)
Ok. Will give more days of doxycycline. Ask that he continue to follow up with coumadin clinic to check inr and make adjustments to coumadin while on doxycycline.

## 2021-04-05 NOTE — Telephone Encounter (Signed)
Spoke with patient and he stated that it was left side.  I offered an appointment for 840am on 04/08/21 and patient declined because he does not get ready that early.   He has a nurse that comes out twice a week to clean wound and feels like it is ok to wait.  His appointment with wound care is 5/31 and vascular is on 6/14.

## 2021-04-05 NOTE — Telephone Encounter (Signed)
[name] [name] 04/04/21 531pm Let pt know he can get compression socks and use to decrease swelling as well as elevate legs daily. Will also go ahead and refer to vascular surgeon. He might also get opinion from wound care MD as well as they will be assesing legs as well.    [name] 04/04/21-826am Spoke w/ Pt- informed of results- he is scheduled to see the wound care center tomorrow (Friday). He wanted to know what else he could do for the swelling in his R leg (reviewed office visit note and didn't see where swelling was mentioned)- he wanted to know if he should see vascular or what the next steps should be regarding that.

## 2021-04-05 NOTE — Telephone Encounter (Signed)
Pt has ulcer in left heel. Was hoping he would attend that appointment with wound care and not delay. If his leg pain on left side?  When is his appointment now?  Can you offer him appointment with Dr. Si Gaul next week. Is that possible. I have seen him twice and delay now of week or more concerning.

## 2021-04-05 NOTE — Telephone Encounter (Signed)
Rx doxycycline sent to pt pharmacy. 

## 2021-04-05 NOTE — Telephone Encounter (Signed)
-----   Message from Mackie Pai, PA-C sent at 04/04/2021  5:31 PM EDT ----- Let pt know he can get compression socks and use to decrease swelling as well as elevate legs daily. Will also go ahead and refer to vascular surgeon. He might also get opinion from wound care MD as well as they will be assesing legs as well.

## 2021-04-08 ENCOUNTER — Ambulatory Visit (INDEPENDENT_AMBULATORY_CARE_PROVIDER_SITE_OTHER): Payer: Medicare Other | Admitting: *Deleted

## 2021-04-08 DIAGNOSIS — Z5181 Encounter for therapeutic drug level monitoring: Secondary | ICD-10-CM | POA: Diagnosis not present

## 2021-04-08 DIAGNOSIS — I4891 Unspecified atrial fibrillation: Secondary | ICD-10-CM

## 2021-04-08 LAB — POCT INR: INR: 2.2 (ref 2.0–3.0)

## 2021-04-08 NOTE — Patient Instructions (Signed)
Description   Spoke with Randell Patient, RN with [facility] while in home with pt and advised for pt to continue taking Warfarin 1/2 tablet daily while pt is on terbinafine x 10 days 7 doxy restarted on 04/05/21 for 1 week. Recheck  INR in 1 week by [facility] [phone]).    Call [phone] if scheduled for any procedures or on any new medications

## 2021-04-08 NOTE — Telephone Encounter (Signed)
Spoke with patient and he stated that he did get the doxycycline and his INR is being checked today.

## 2021-04-11 ENCOUNTER — Other Ambulatory Visit: Payer: Self-pay | Admitting: *Deleted

## 2021-04-11 DIAGNOSIS — I83891 Varicose veins of right lower extremities with other complications: Secondary | ICD-10-CM

## 2021-04-16 ENCOUNTER — Telehealth: Payer: Self-pay | Admitting: *Deleted

## 2021-04-16 ENCOUNTER — Ambulatory Visit (INDEPENDENT_AMBULATORY_CARE_PROVIDER_SITE_OTHER): Payer: Medicare Other

## 2021-04-16 DIAGNOSIS — Z5181 Encounter for therapeutic drug level monitoring: Secondary | ICD-10-CM

## 2021-04-16 LAB — POCT INR: INR: 2 (ref 2.0–3.0)

## 2021-04-16 NOTE — Telephone Encounter (Signed)
[name] from [facility] calling for verbal order to continue Home health.  Verbal order given.

## 2021-04-16 NOTE — Patient Instructions (Signed)
Spoke with Lenna Sciara, RN with [facility] while in home with pt and advised that since pt has completed doxy, for pt to  - restart previous  Warfarin dosage of  1/2 tablet daily  except 1 tablet on Mondays & Fridays.  - Recheck  INR in 1 week by [facility] [phone]).    Call [phone] if scheduled for any procedures or on any new medications

## 2021-04-22 ENCOUNTER — Other Ambulatory Visit (HOSPITAL_COMMUNITY): Payer: Self-pay | Admitting: Internal Medicine

## 2021-04-22 ENCOUNTER — Ambulatory Visit (INDEPENDENT_AMBULATORY_CARE_PROVIDER_SITE_OTHER): Payer: Medicare Other | Admitting: Pharmacist

## 2021-04-22 DIAGNOSIS — Z5181 Encounter for therapeutic drug level monitoring: Secondary | ICD-10-CM | POA: Diagnosis not present

## 2021-04-22 LAB — POCT INR: INR: 2.4 (ref 2.0–3.0)

## 2021-04-22 NOTE — Patient Instructions (Signed)
Description   Spoke with Randell Patient, RN with [facility] while in home with pt and advised him to continue taking 1/2 tablet daily except 1 tablet on Mondays & Fridays. Recheck  INR in 2 weeks by [facility] [phone]).

## 2021-04-25 ENCOUNTER — Other Ambulatory Visit (HOSPITAL_COMMUNITY): Payer: Self-pay | Admitting: *Deleted

## 2021-04-29 ENCOUNTER — Inpatient Hospital Stay (HOSPITAL_BASED_OUTPATIENT_CLINIC_OR_DEPARTMENT_OTHER): Payer: Medicare Other | Admitting: Hematology & Oncology

## 2021-04-29 ENCOUNTER — Other Ambulatory Visit: Payer: Self-pay

## 2021-04-29 ENCOUNTER — Encounter: Payer: Self-pay | Admitting: Hematology & Oncology

## 2021-04-29 ENCOUNTER — Telehealth: Payer: Self-pay

## 2021-04-29 ENCOUNTER — Inpatient Hospital Stay: Payer: Medicare Other | Attending: Hematology & Oncology

## 2021-04-29 VITALS — BP 111/55 | HR 71 | Temp 98.1°F | Resp 18

## 2021-04-29 DIAGNOSIS — D696 Thrombocytopenia, unspecified: Secondary | ICD-10-CM

## 2021-04-29 DIAGNOSIS — Z7901 Long term (current) use of anticoagulants: Secondary | ICD-10-CM | POA: Diagnosis not present

## 2021-04-29 DIAGNOSIS — Z79899 Other long term (current) drug therapy: Secondary | ICD-10-CM | POA: Diagnosis not present

## 2021-04-29 LAB — CMP (CANCER CENTER ONLY)
ALT: 9 U/L (ref 0–44)
AST: 13 U/L — ABNORMAL LOW (ref 15–41)
Albumin: 3.8 g/dL (ref 3.5–5.0)
Alkaline Phosphatase: 40 U/L (ref 38–126)
Anion gap: 7 (ref 5–15)
BUN: 30 mg/dL — ABNORMAL HIGH (ref 8–23)
CO2: 30 mmol/L (ref 22–32)
Calcium: 9.9 mg/dL (ref 8.9–10.3)
Chloride: 102 mmol/L (ref 98–111)
Creatinine: 1.11 mg/dL (ref 0.61–1.24)
GFR, Estimated: >60 mL/min (ref >60)
Glucose, Bld: 139 mg/dL — ABNORMAL HIGH (ref 70–99)
Potassium: 4.3 mmol/L (ref 3.5–5.1)
Sodium: 139 mmol/L (ref 135–145)
Total Bilirubin: 0.5 mg/dL (ref 0.3–1.2)
Total Protein: 6.4 g/dL — ABNORMAL LOW (ref 6.5–8.1)

## 2021-04-29 LAB — CBC WITH DIFFERENTIAL (CANCER CENTER ONLY)
Abs Immature Granulocytes: 0.06 10*3/uL (ref 0.00–0.07)
Basophils Absolute: 0 10*3/uL (ref 0.0–0.1)
Basophils Relative: 0 %
Eosinophils Absolute: 0 10*3/uL (ref 0.0–0.5)
Eosinophils Relative: 0 %
HCT: 41.4 % (ref 39.0–52.0)
Hemoglobin: 13 g/dL (ref 13.0–17.0)
Immature Granulocytes: 1 %
Lymphocytes Relative: 19 %
Lymphs Abs: 1.4 10*3/uL (ref 0.7–4.0)
MCH: 28.1 pg (ref 26.0–34.0)
MCHC: 31.4 g/dL (ref 30.0–36.0)
MCV: 89.4 fL (ref 80.0–100.0)
Monocytes Absolute: 1.1 10*3/uL — ABNORMAL HIGH (ref 0.1–1.0)
Monocytes Relative: 15 %
Neutro Abs: 4.5 10*3/uL (ref 1.7–7.7)
Neutrophils Relative %: 65 %
Platelet Count: 67 10*3/uL — ABNORMAL LOW (ref 150–400)
RBC: 4.63 MIL/uL (ref 4.22–5.81)
RDW: 15.5 % (ref 11.5–15.5)
WBC Count: 7 10*3/uL (ref 4.0–10.5)
nRBC: 0 % (ref 0.0–0.2)

## 2021-04-29 LAB — PLATELET BY CITRATE

## 2021-04-29 NOTE — Progress Notes (Signed)
Hematology and Oncology Follow Up Visit  TAYT MOYERS [id number] October 08, 1931 87 y.o. 04/29/2021   Principle Diagnosis:  Chronic thrombocytopenia-immune-based versus medication  Current Therapy:   Observation     Interim History:  Mr. Stai is back for follow-up.  We saw him 6 months ago.  Since then, he has had some falling episodes.  He did fall at 1 point back in January I think broke some bones in his right foot.  He needed to have a walking boot on for 9-1/2 weeks he says.  Thankfully, he did not need surgery.  He said he was in a nursing home for about a month and a half try to recover.  Again, has had no problems with bleeding despite the following.  He is on Coumadin because of the chronic atrial fibrillation.  He has had no problems with fever.  He has had no cough.  Is been no chest wall pain.  He has had no nausea or vomiting.  He has had no change in bowel or bladder habits.  He does have some nocturia even though his prostate gland is out.  Overall, he seems to be managing.  He does have caregivers that do help him out at home.  I would have to say that his performance status for now is ECOG 3.    Medications:  Current Outpatient Medications:    acetaminophen (TYLENOL) 500 MG tablet, Take 1,000 mg by mouth every 6 (six) hours as needed for mild pain., Disp: , Rfl:    amiodarone (PACERONE) 200 MG tablet, Take 0.5 tablets (100 mg total) by mouth daily., Disp: 90 tablet, Rfl: 0   b complex vitamins tablet, Take 1 tablet by mouth daily., Disp: , Rfl:    cycloSPORINE (RESTASIS) 0.05 % ophthalmic emulsion, Place 1 drop into both eyes 2 (two) times daily., Disp: , Rfl:    fluticasone (FLONASE) 50 MCG/ACT nasal spray, Place 2 sprays into both nostrils daily. , Disp: , Rfl: 11   gabapentin (NEURONTIN) 300 MG capsule, TAKE 1 CAPSULE BY MOUTH  TWICE DAILY, Disp: 180 capsule, Rfl: 3   Multiple Vitamin (MULTIVITAMIN WITH MINERALS) TABS tablet, Take 1 tablet by mouth every evening.  , Disp: , Rfl:    Neo-Bacit-Poly-Lidocaine (TRIPLE ANTIBIOTIC MAX ST EX), Apply topically., Disp: , Rfl:    potassium chloride SA (KLOR-CON M20) 20 MEQ tablet, Take 2 tablets (40 mEq total) by mouth daily., Disp: 180 tablet, Rfl: 3   rosuvastatin (CRESTOR) 5 MG tablet, TAKE 1 TABLET BY MOUTH  DAILY, Disp: 90 tablet, Rfl: 3   torsemide (DEMADEX) 20 MG tablet, TAKE 2 TABLETS BY MOUTH  DAILY, Disp: 180 tablet, Rfl: 3   traZODone (DESYREL) 100 MG tablet, Take 200 mg by mouth at bedtime., Disp: , Rfl:    warfarin (COUMADIN) 5 MG tablet, TAKE 1 TABLET BY MOUTH  DAILY AS DIRECTED BY THE  COUMADIN CLINIC, Disp: 90 tablet, Rfl: 3  Allergies: No Known Allergies  Past Medical History, Surgical history, Social history, and Family History were reviewed and updated.  Review of Systems: Review of Systems  Constitutional: Negative.   HENT:  Negative.    Eyes: Negative.   Respiratory: Negative.    Cardiovascular: Negative.   Gastrointestinal: Negative.   Endocrine: Negative.   Genitourinary: Negative.    Musculoskeletal:  Positive for arthralgias.  Skin: Negative.   Neurological: Negative.   Hematological: Negative.   Psychiatric/Behavioral: Negative.     Physical Exam:  oral temperature is 98.1 F (36.7 C).  His blood pressure is 111/55 (abnormal) and his pulse is 71. His respiration is 18 and oxygen saturation is 98%.   Wt Readings from Last 3 Encounters:  03/28/21 173 lb (78.5 kg)  03/22/21 173 lb (78.5 kg)  03/19/21 173 lb (78.5 kg)    Physical Exam Vitals reviewed.  HENT:     Head: Normocephalic and atraumatic.  Eyes:     Pupils: Pupils are equal, round, and reactive to light.  Cardiovascular:     Rate and Rhythm: Normal rate and regular rhythm.     Heart sounds: Normal heart sounds.  Pulmonary:     Effort: Pulmonary effort is normal.     Breath sounds: Normal breath sounds.  Abdominal:     General: Bowel sounds are normal.     Palpations: Abdomen is soft.  Musculoskeletal:         General: No tenderness or deformity. Normal range of motion.     Cervical back: Normal range of motion.  Lymphadenopathy:     Cervical: No cervical adenopathy.  Skin:    General: Skin is warm and dry.     Findings: No erythema or rash.  Neurological:     Mental Status: He is alert and oriented to person, place, and time.  Psychiatric:        Behavior: Behavior normal.        Thought Content: Thought content normal.        Judgment: Judgment normal.     Lab Results  Component Value Date   WBC 7.0 04/29/2021   HGB 13.0 04/29/2021   HCT 41.4 04/29/2021   MCV 89.4 04/29/2021   PLT 67 (L) 04/29/2021     Chemistry      Component Value Date/Time   NA 139 04/29/2021 1007   NA 143 02/23/2017 1103   K 4.3 04/29/2021 1007   CL 102 04/29/2021 1007   CO2 30 04/29/2021 1007   BUN 30 (H) 04/29/2021 1007   BUN 25 02/23/2017 1103   CREATININE 1.11 04/29/2021 1007   CREATININE 0.92 08/14/2020 1241      Component Value Date/Time   CALCIUM 9.9 04/29/2021 1007   ALKPHOS 40 04/29/2021 1007   AST 13 (L) 04/29/2021 1007   ALT 9 04/29/2021 1007   BILITOT 0.5 04/29/2021 1007       Impression and Plan: Mr. Hoffer is a 87 year old white male.  He has mild thrombocytopenia.  I looked at his blood smear today.  I do not see anything that looked suspicious.  He still has decreased number of platelets.  Platelets appear to be relatively well granulated.  I just hate the fact that he falls.  Again if he broke his hip I think he would be in deep trouble.  We will still follow him along every 6 months.  If there are any problems before then, we will see him back.   Volanda Napoleon, MD 6/13/202211:33 AM

## 2021-04-29 NOTE — Telephone Encounter (Signed)
Appts made and printed for pt per 04/29/21 los

## 2021-04-30 ENCOUNTER — Encounter: Payer: Medicare Other | Admitting: Vascular Surgery

## 2021-04-30 ENCOUNTER — Encounter (HOSPITAL_COMMUNITY): Payer: Medicare Other

## 2021-05-06 ENCOUNTER — Ambulatory Visit (INDEPENDENT_AMBULATORY_CARE_PROVIDER_SITE_OTHER): Payer: Medicare Other

## 2021-05-06 DIAGNOSIS — I4891 Unspecified atrial fibrillation: Secondary | ICD-10-CM | POA: Diagnosis not present

## 2021-05-06 DIAGNOSIS — Z5181 Encounter for therapeutic drug level monitoring: Secondary | ICD-10-CM | POA: Diagnosis not present

## 2021-05-06 LAB — POCT INR: INR: 3.6 — AB (ref 2.0–3.0)

## 2021-05-06 NOTE — Patient Instructions (Signed)
Description   Spoke with [name] Patient, RN with [facility] while in home with pt and advised him to skip today's dosage of Warfarin, then start taking 1/2 tablet daily except 1 tablet on Fridays. Recheck  INR in 1 week by [facility] [phone]).

## 2021-05-08 ENCOUNTER — Ambulatory Visit (INDEPENDENT_AMBULATORY_CARE_PROVIDER_SITE_OTHER): Payer: Medicare Other | Admitting: Vascular Surgery

## 2021-05-08 ENCOUNTER — Ambulatory Visit (HOSPITAL_COMMUNITY)
Admission: RE | Admit: 2021-05-08 | Discharge: 2021-05-08 | Disposition: A | Discharge status: Home / Self Care | Payer: Medicare Other | Source: Ambulatory Visit | Attending: Vascular Surgery | Admitting: Vascular Surgery

## 2021-05-08 ENCOUNTER — Encounter: Payer: Self-pay | Admitting: Vascular Surgery

## 2021-05-08 ENCOUNTER — Other Ambulatory Visit: Payer: Self-pay

## 2021-05-08 VITALS — BP 120/81 | HR 73 | Temp 98.4°F | Resp 20 | Ht 76.0 in | Wt 173.0 lb

## 2021-05-08 DIAGNOSIS — I83891 Varicose veins of right lower extremities with other complications: Secondary | ICD-10-CM

## 2021-05-08 DIAGNOSIS — I872 Venous insufficiency (chronic) (peripheral): Secondary | ICD-10-CM | POA: Diagnosis not present

## 2021-05-08 NOTE — Progress Notes (Signed)
ASSESSMENT & PLAN   CHRONIC VENOUS INSUFFICIENCY: This patient has CEAP C4b venous disease (hyperpigmentation/lipodermatosclerosis).  Fortunately, his noninvasive studies show no evidence of significant arterial insufficiency.  He does not have any significant superficial venous reflux but has deep venous reflux in the popliteal vein on the right.  He has no evidence of DVT.  We have discussed the importance of intermittent leg elevation and the proper positioning for this.  In addition I think he could use 4 inch Ace bandages for some mild compression in both legs until the heel ulcers have resolved and then he can go back to using his compression stockings.  I have encouraged him to avoid prolonged sitting and standing.  We have discussed the importance of exercise specifically walking and water aerobics once his wounds have healed.  Currently he is not a candidate for any further venous procedures and I do not think he needs an aggressive arterial work-up given that his noninvasive study showed excellent perfusion.  I will see him back as needed.  REASON FOR CONSULT:    Right lower extremity edema.  The consult is requested by Mackie Pai, PA.  HPI:   [name] [name] is a 87 y.o. male who was referred with venous disease and right leg swelling.  The patient has had swelling in the right leg for months.  This is worse at the end of the day.  He also has some heel wounds which reportedly are improving nicely and at this point are quite small.  He just had his dressing changed today and felt strongly about not removing his dressings.  I do not get any clear-cut history of claudication although I think his activity is fairly limited.  He denies any history of rest pain or nonhealing ulcers.  He is ambulatory with a walker.  He does describe some aching pain in his legs associated with prolonged sitting and standing.  He is unaware of any previous history of DVT.  Past Medical History:  Diagnosis  Date   Abnormality of gait 05/27/2016   Adenomatous polyps    Carpal tunnel syndrome 06/25/2016   Right   Depressive disorder, not elsewhere classified    First degree AV block    Holter 3/18: NSR, PACs, PVCs, no AFib, no pauses.   Hereditary and idiopathic peripheral neuropathy 06/25/2016   Hypertension    Internal nasal lesion 05/15/2013   Melanoma ([place])    Left Shoulder   Mitral regurgitation    MVP (mitral valve prolapse)    a. With severe MR s/p Complex valvuloplasty including artificial Gore-tex neochord placement x4, chordal transposition x1, chordal release x1, # 32 mm Sorin Memo 3D Ring Annuloplasty 2012. // b. Echo 2/18: mild LVH, EF 50-55, mild AI, MV repair with mild MR, mod LAE, mod RVE, severe RAE, severe TR   Neuropathy    Normal coronary arteries    a. Normal coronary anatomy by cath 2012.   Osteoarthritis    Knees   PAF (paroxysmal atrial fibrillation) ([place])    a. Post-op MVR 2012.   Personal history of colonic polyps    Prostate cancer ([name])    Pulmonary HTN ([place])    a. Mild-mod by cath 2012.   Pure hypercholesterolemia    PVC (premature ventricular contraction)    Thrombocytopenia ([facility])    Vision abnormalities    Cornea scarring    Family History  Problem Relation Age of Onset   Clotting disorder Brother  CVA's   Arthritis Mother    Hypertension Mother    Stroke Mother    Hypertension Father    Psychosis Father        psychiatric care   Colon cancer Neg Hx    Stomach cancer Neg Hx    Heart attack Neg Hx    Prostate cancer Neg Hx    Pancreatic cancer Neg Hx     SOCIAL HISTORY: Social History   Tobacco Use   Smoking status: Never   Smokeless tobacco: Never  Substance Use Topics   Alcohol use: No    Alcohol/week: 0.0 standard drinks    Comment: Last drink in 2000    No Known Allergies  Current Outpatient Medications  Medication Sig Dispense Refill   acetaminophen (TYLENOL) 500 MG tablet Take 1,000 mg by mouth every 6 (six) hours as  needed for mild pain.     amiodarone (PACERONE) 200 MG tablet Take 0.5 tablets (100 mg total) by mouth daily. 90 tablet 0   b complex vitamins tablet Take 1 tablet by mouth daily.     cycloSPORINE (RESTASIS) 0.05 % ophthalmic emulsion Place 1 drop into both eyes 2 (two) times daily.     fluticasone (FLONASE) 50 MCG/ACT nasal spray Place 2 sprays into both nostrils daily.   11   gabapentin (NEURONTIN) 300 MG capsule TAKE 1 CAPSULE BY MOUTH  TWICE DAILY 180 capsule 3   Multiple Vitamin (MULTIVITAMIN WITH MINERALS) TABS tablet Take 1 tablet by mouth every evening.      Neo-Bacit-Poly-Lidocaine (TRIPLE ANTIBIOTIC MAX ST EX) Apply topically.     potassium chloride SA (KLOR-CON M20) 20 MEQ tablet Take 2 tablets (40 mEq total) by mouth daily. 180 tablet 3   rosuvastatin (CRESTOR) 5 MG tablet TAKE 1 TABLET BY MOUTH  DAILY 90 tablet 3   torsemide (DEMADEX) 20 MG tablet TAKE 2 TABLETS BY MOUTH  DAILY 180 tablet 3   traZODone (DESYREL) 100 MG tablet Take 200 mg by mouth at bedtime.     warfarin (COUMADIN) 5 MG tablet TAKE 1 TABLET BY MOUTH  DAILY AS DIRECTED BY THE  COUMADIN CLINIC 90 tablet 3   No current facility-administered medications for this visit.    REVIEW OF SYSTEMS:  [X]  denotes positive finding, [ ]  denotes negative finding Cardiac  Comments:  Chest pain or chest pressure:    Shortness of breath upon exertion:    Short of breath when lying flat:    Irregular heart rhythm:        Vascular    Pain in calf, thigh, or hip brought on by ambulation: x   Pain in feet at night that wakes you up from your sleep:     Blood clot in your veins:    Leg swelling:  x       Pulmonary    Oxygen at home:    Productive cough:     Wheezing:         Neurologic    Sudden weakness in arms or legs:  x   Sudden numbness in arms or legs:     Sudden onset of difficulty speaking or slurred speech:    Temporary loss of vision in one eye:     Problems with dizziness:         Gastrointestinal    Blood  in stool:     Vomited blood:         Genitourinary    Burning when urinating:     Blood  in urine:        Psychiatric    Major depression:         Hematologic    Bleeding problems: x   Problems with blood clotting too easily:        Skin    Rashes or ulcers:        Constitutional    Fever or chills:    -  PHYSICAL EXAM:   Vitals:   05/08/21 1330  BP: 120/81  Pulse: 73  Resp: 20  Temp: 98.4 F (36.9 C)  SpO2: 92%  Weight: 173 lb (78.5 kg)  Height: 6\' 4"  (1.93 m)   Body mass index is 21.06 kg/m. GENERAL: The patient is a well-nourished male, in no acute distress. The vital signs are documented above. CARDIAC: There is a regular rate and rhythm.  VASCULAR: I do not detect carotid bruits. I could not assess his femoral pulses as he was in a wheelchair.  I cannot palpate pedal pulses. I did listen with a Doppler to both feet and he has multiphasic signals in the dorsalis pedis and posterior tibial positions bilaterally. He has significant hyperpigmentation bilaterally as documented below.     PULMONARY: There is good air exchange bilaterally without wheezing or rales. ABDOMEN: Soft and non-tender with normal pitched bowel sounds.  MUSCULOSKELETAL: There are no major deformities. NEUROLOGIC: No focal weakness or paresthesias are detected. SKIN: There are no ulcers or rashes noted. PSYCHIATRIC: The patient has a normal affect.  DATA:    VENOUS DUPLEX: I have independently interpreted his venous duplex scan today.  This was of the right lower extremity only.  There was no evidence of DVT or superficial venous thrombosis.  There was deep venous reflux involving the popliteal vein.  There was no superficial venous reflux.  ARTERIAL DOPPLER STUDY: I did review his arterial Doppler study that was done on 04/03/2021.  On the right side there was a triphasic dorsalis pedis signal with a biphasic posterior tibial signal.  ABI was 100%.  Toe pressure 104 mmHg.  On the left  side there was a triphasic dorsalis pedis and posterior tibial signal.  ABI was greater than 100.  Toe pressure was 131 mmHg.   Deitra Mayo [facility]

## 2021-05-14 ENCOUNTER — Other Ambulatory Visit: Payer: Self-pay

## 2021-05-14 ENCOUNTER — Telehealth: Payer: Self-pay

## 2021-05-14 ENCOUNTER — Ambulatory Visit: Payer: Medicare Other | Admitting: Family Medicine

## 2021-05-14 ENCOUNTER — Ambulatory Visit (INDEPENDENT_AMBULATORY_CARE_PROVIDER_SITE_OTHER): Payer: Medicare Other | Admitting: Family Medicine

## 2021-05-14 VITALS — BP 118/74 | HR 85 | Temp 98.1°F | Resp 16

## 2021-05-14 DIAGNOSIS — I10 Essential (primary) hypertension: Secondary | ICD-10-CM

## 2021-05-14 DIAGNOSIS — R600 Localized edema: Secondary | ICD-10-CM

## 2021-05-14 DIAGNOSIS — K5901 Slow transit constipation: Secondary | ICD-10-CM

## 2021-05-14 DIAGNOSIS — N289 Disorder of kidney and ureter, unspecified: Secondary | ICD-10-CM

## 2021-05-14 DIAGNOSIS — Z23 Encounter for immunization: Secondary | ICD-10-CM

## 2021-05-14 DIAGNOSIS — E7800 Pure hypercholesterolemia, unspecified: Secondary | ICD-10-CM

## 2021-05-14 DIAGNOSIS — R739 Hyperglycemia, unspecified: Secondary | ICD-10-CM | POA: Diagnosis not present

## 2021-05-14 DIAGNOSIS — N189 Chronic kidney disease, unspecified: Secondary | ICD-10-CM

## 2021-05-14 DIAGNOSIS — S41112A Laceration without foreign body of left upper arm, initial encounter: Secondary | ICD-10-CM

## 2021-05-14 DIAGNOSIS — I071 Rheumatic tricuspid insufficiency: Secondary | ICD-10-CM

## 2021-05-14 DIAGNOSIS — D696 Thrombocytopenia, unspecified: Secondary | ICD-10-CM | POA: Diagnosis not present

## 2021-05-14 LAB — CBC
HCT: 38.4 % — ABNORMAL LOW (ref 39.0–52.0)
Hemoglobin: 12.6 g/dL — ABNORMAL LOW (ref 13.0–17.0)
MCHC: 32.9 g/dL (ref 30.0–36.0)
MCV: 86.6 fl (ref 78.0–100.0)
Platelets: 78 10*3/uL — ABNORMAL LOW (ref 150.0–400.0)
RBC: 4.44 Mil/uL (ref 4.22–5.81)
RDW: 16.1 % — ABNORMAL HIGH (ref 11.5–15.5)
WBC: 6 10*3/uL (ref 4.0–10.5)

## 2021-05-14 LAB — TSH: TSH: 1.14 u[IU]/mL (ref 0.35–4.50)

## 2021-05-14 LAB — HEMOGLOBIN A1C: Hgb A1c MFr Bld: 6.3 % (ref 4.6–6.5)

## 2021-05-14 NOTE — Progress Notes (Signed)
Patient ID: [name] [name], male    DOB: 04-Oct-1931  Age: 87 y.o. MRN: [id number]    Subjective:  Subjective  HPI [name] [name] presents for office visit today for follow up on Fall and diet management. He reports that he fell on 12/16/2020 while getting out of bed and twisted his leg and fell. He states that he broke 3 bones. He states that his right foot has healed, but not his left leg. He states that he has trouble getting out of chair and trouble walking towards end of day. He denies CP/palp/SOB/HA/congestion/fevers or GU c/o. Taking meds as prescribed. He states that he experiences constipation and diarrhea which he takes MiraLax and Metamucil PRN.  Review of Systems  Constitutional:  Negative for chills, fatigue and fever.  HENT:  Negative for congestion, rhinorrhea, sinus pressure, sinus pain and sore throat.   Eyes:  Negative for pain.  Respiratory:  Negative for cough and shortness of breath.   Cardiovascular:  Negative for chest pain, palpitations and leg swelling.  Gastrointestinal:  Positive for constipation. Negative for abdominal pain, blood in stool, diarrhea, nausea and vomiting.  Genitourinary:  Negative for flank pain, frequency and penile pain.  Musculoskeletal:  Negative for back pain.  Neurological:  Negative for headaches.   History Past Medical History:  Diagnosis Date   Abnormality of gait 05/27/2016   Adenomatous polyps    Carpal tunnel syndrome 06/25/2016   Right   Depressive disorder, not elsewhere classified    First degree AV block    Holter 3/18: NSR, PACs, PVCs, no AFib, no pauses.   Hereditary and idiopathic peripheral neuropathy 06/25/2016   Hypertension    Internal nasal lesion 05/15/2013   Melanoma ([place])    Left Shoulder   Mitral regurgitation    MVP (mitral valve prolapse)    a. With severe MR s/p Complex valvuloplasty including artificial Gore-tex neochord placement x4, chordal transposition x1, chordal release x1, # 32 mm Sorin Memo 3D Ring  Annuloplasty 2012. // b. Echo 2/18: mild LVH, EF 50-55, mild AI, MV repair with mild MR, mod LAE, mod RVE, severe RAE, severe TR   Neuropathy    Normal coronary arteries    a. Normal coronary anatomy by cath 2012.   Osteoarthritis    Knees   PAF (paroxysmal atrial fibrillation) ([place])    a. Post-op MVR 2012.   Personal history of colonic polyps    Prostate cancer ([place])    Pulmonary HTN ([place])    a. Mild-mod by cath 2012.   Pure hypercholesterolemia    PVC (premature ventricular contraction)    Thrombocytopenia (HCC)    Vision abnormalities    Cornea scarring    He has a past surgical history that includes Nuclear Stress Test (09/2006); US ECHOCARDIOGRAPHY (09/2009, 08/1011); Inguinal hernia repair (09/2009); Knee arthroscopy; Colonoscopy w/ polypectomy; Prostatectomy (1993); Rotator cuff repair (2003); Melanoma Surgery; TEE without cardioversion (09/26/2011); Mitral valve repair (10/01/2011); Root canal (08-19-12); Cardiac catheterization (09/2011); Cardioversion (N/A, 01/02/2016); and RIGHT HEART CATH (N/A, 04/29/2017).   His family history includes Arthritis in his mother; Clotting disorder in his brother; Hypertension in his father and mother; Psychosis in his father; Stroke in his mother.He reports that he has never smoked. He has never used smokeless tobacco. He reports that he does not drink alcohol and does not use drugs.  Current Outpatient Medications on File Prior to Visit  Medication Sig Dispense Refill   acetaminophen (TYLENOL) 500 MG tablet Take 1,000 mg by mouth every 6 (  six) hours as needed for mild pain.     amiodarone (PACERONE) 200 MG tablet Take 0.5 tablets (100 mg total) by mouth daily. 90 tablet 0   b complex vitamins tablet Take 1 tablet by mouth daily.     cycloSPORINE (RESTASIS) 0.05 % ophthalmic emulsion Place 1 drop into both eyes 2 (two) times daily.     fluticasone (FLONASE) 50 MCG/ACT nasal spray Place 2 sprays into both nostrils daily.   11   gabapentin  (NEURONTIN) 300 MG capsule TAKE 1 CAPSULE BY MOUTH  TWICE DAILY 180 capsule 3   Multiple Vitamin (MULTIVITAMIN WITH MINERALS) TABS tablet Take 1 tablet by mouth every evening.      potassium chloride SA (KLOR-CON M20) 20 MEQ tablet Take 2 tablets (40 mEq total) by mouth daily. 180 tablet 3   rosuvastatin (CRESTOR) 5 MG tablet TAKE 1 TABLET BY MOUTH  DAILY 90 tablet 3   torsemide (DEMADEX) 20 MG tablet TAKE 2 TABLETS BY MOUTH  DAILY 180 tablet 3   traZODone (DESYREL) 100 MG tablet Take 200 mg by mouth at bedtime.     warfarin (COUMADIN) 5 MG tablet TAKE 1 TABLET BY MOUTH  DAILY AS DIRECTED BY THE  COUMADIN CLINIC 90 tablet 3   [DISCONTINUED] pantoprazole (PROTONIX) 40 MG tablet Take 1 tablet (40 mg total) by mouth daily before breakfast.     No current facility-administered medications on file prior to visit.     Objective:  Objective  Physical Exam Constitutional:      General: He is not in acute distress.    Appearance: Normal appearance. He is not ill-appearing or toxic-appearing.  HENT:     Head: Normocephalic and atraumatic.     Right Ear: Tympanic membrane, ear canal and external ear normal.     Left Ear: Tympanic membrane, ear canal and external ear normal.     Nose: No congestion or rhinorrhea.  Eyes:     Extraocular Movements: Extraocular movements intact.     Pupils: Pupils are equal, round, and reactive to light.  Cardiovascular:     Rate and Rhythm: Normal rate and regular rhythm.     Pulses: Normal pulses.     Heart sounds: Murmur heard.  Pulmonary:     Effort: Pulmonary effort is normal. No respiratory distress.     Breath sounds: Normal breath sounds. No wheezing, rhonchi or rales.  Abdominal:     General: Bowel sounds are normal.     Palpations: Abdomen is soft. There is no mass.     Tenderness: no abdominal tenderness There is no guarding.     Hernia: No hernia is present.  Musculoskeletal:        General: Normal range of motion.     Cervical back: Normal range  of motion and neck supple.  Skin:    General: Skin is warm and dry.  Neurological:     Mental Status: He is alert and oriented to person, place, and time.  Psychiatric:        Behavior: Behavior normal.   BP 118/74   Pulse 85   Temp 98.1 F (36.7 C)   Resp 16   SpO2 94%  Wt Readings from Last 3 Encounters:  05/08/21 173 lb (78.5 kg)  03/28/21 173 lb (78.5 kg)  03/22/21 173 lb (78.5 kg)     Lab Results  Component Value Date   WBC 6.0 05/14/2021   HGB 12.6 (L) 05/14/2021   HCT 38.4 (L) 05/14/2021   PLT  78.0 (L) 05/14/2021   GLUCOSE 116 (H) 05/14/2021   CHOL 108 05/14/2021   TRIG 96.0 05/14/2021   HDL 42.00 05/14/2021   LDLCALC 47 05/14/2021   ALT 9 05/14/2021   AST 12 05/14/2021   NA 142 05/14/2021   K 4.8 05/14/2021   CL 105 05/14/2021   CREATININE 1.01 05/14/2021   BUN 22 05/14/2021   CO2 30 05/14/2021   TSH 1.14 05/14/2021   PSA 1.70 09/11/2020   INR 2.8 05/15/2021   HGBA1C 6.3 05/14/2021    VAS [place] LOWER EXTREMITY VENOUS REFLUX  Result Date: 05/08/2021  Lower Venous Reflux Study Patient Name:  [name] [name]  Date of Exam:   05/08/2021 Medical Rec #: [id number]        Accession #:    [id number] Date of Birth: Dec 17, 1930        Patient Gender: M Patient Age:   89Y Exam Location:  [facility] Procedure:      VAS [place] LOWER EXTREMITY VENOUS REFLUX Referring Phys: [id number] [place] --------------------------------------------------------------------------------  Indications: Edema.  Limitations: Poor patient position. Comparison Study: 02/17/17 Venous reflux in right popliteal vein only Performing Technologist: June Leap RDMS, RVT  Examination Guidelines: A complete evaluation includes B-mode imaging, spectral Doppler, color Doppler, and power Doppler as needed of all accessible portions of each vessel. Bilateral testing is considered an integral part of a complete examination. Limited examinations for reoccurring indications may be performed as  noted. The reflux portion of the exam is performed with the patient in reverse Trendelenburg. Significant venous reflux is defined as >500 ms in the superficial venous system, and >1 second in the deep venous system.  Venous Reflux Times +--------------+---------+------+-----------+------------+--------+ RIGHT         Reflux NoRefluxReflux TimeDiameter cmsComments                         Yes                                  +--------------+---------+------+-----------+------------+--------+ CFV           no                                             +--------------+---------+------+-----------+------------+--------+ FV mid        no                                             +--------------+---------+------+-----------+------------+--------+ Popliteal               yes                                  +--------------+---------+------+-----------+------------+--------+ GSV [facility]    no                            0.79             +--------------+---------+------+-----------+------------+--------+ GSV prox thighno  0.3              +--------------+---------+------+-----------+------------+--------+ GSV mid thigh no                            0.21             +--------------+---------+------+-----------+------------+--------+ GSV dist thigh                              0.23             +--------------+---------+------+-----------+------------+--------+ GSV at knee                                 0.21             +--------------+---------+------+-----------+------------+--------+ GSV prox calf                               0.13             +--------------+---------+------+-----------+------------+--------+ SSV Pop Fossa no                            0.16             +--------------+---------+------+-----------+------------+--------+ SSV prox calf no                            0.2               +--------------+---------+------+-----------+------------+--------+ SSV mid calf  no                            0.25             +--------------+---------+------+-----------+------------+--------+   Summary: Right: - No evidence of deep vein thrombosis seen in the right lower extremity, from the common femoral through the popliteal veins. - No evidence of superficial venous thrombosis in the right lower extremity. - Venous reflux is noted in the right popliteal vein.  *See table(s) above for measurements and observations. Electronically signed by Deitra Mayo MD on 05/08/2021 at 1:59:59 PM.    Final      Assessment & Plan:  Plan    No orders of the defined types were placed in this encounter.   Problem List Items Addressed This Visit     Severe tricuspid regurgitation (Chronic)    Is here today with a caregiver who helps him stay in his home and take care of basic activities he reports he is doing well       HYPERCHOLESTEROLEMIA    Encourage heart healthy diet such as MIND or DASH diet, increase exercise, avoid trans fats, simple carbohydrates and processed foods, consider a krill or fish or flaxseed oil cap daily. Tolerating Rosuvastatin.        Relevant Orders   Lipid panel (Completed)   Thrombocytopenia (HCC)   HYPERTENSION, BENIGN ESSENTIAL - Primary    Well controlled, no changes to meds. Encouraged heart healthy diet such as the DASH diet and exercise as tolerated.        Relevant Orders   CBC (Completed)   TSH (Completed)   Constipation   Bilateral lower extremity edema    He developed bilateral pressure  sores and is now following with wound management the right is much better and the left is improving.        Chronic renal insufficiency    Hydrate and monitor       Relevant Orders   Comprehensive metabolic panel (Completed)   Hyperglycemia   Relevant Orders   Hemoglobin A1c (Completed)   Other Visit Diagnoses     Need for Tdap vaccination        Relevant Orders   Tdap vaccine greater than or equal to 7yo IM (Completed)   Laceration of left upper extremity, initial encounter       Relevant Orders   Tdap vaccine greater than or equal to 7yo IM (Completed)       Follow-up: Return in about 3 months (around 08/14/2021).  I, Suezanne Jacquet, acting as a scribe for Penni Homans, MD, have documented all relevent documentation on behalf of Penni Homans, MD, as directed by Penni Homans, MD while in the presence of Penni Homans, MD.  I, [name], MD personally performed the services described in this documentation. All medical record entries made by the scribe were at my direction and in my presence. I have reviewed the chart and agree that the record reflects my personal performance and is accurate and complete

## 2021-05-14 NOTE — Telephone Encounter (Signed)
Pt says he did not receive an AVS this AM, he would like one mailed to him.

## 2021-05-14 NOTE — Telephone Encounter (Signed)
AVS mailed to patient.

## 2021-05-14 NOTE — Patient Instructions (Signed)
Preventing Pressure Injuries  A pressure injury, sometimes called a bedsore or a pressure ulcer, is an injury to the skin and underlying tissue caused by pressure. A pressure injury can happen when your skin presses against a surface, such as a mattress or wheelchair seat, for too long. The pressure on the blood vessels causes reduced blood flow to your skin. This can eventually cause the skin tissue to die andbreak down into a wound. Pressure injuries usually develop: Over bony parts of the body, such as the tailbone, shoulders, elbows, hips, and heels. Under medical devices, such as respiratory equipment, stockings, tubes, and splints. How can this condition affect me? Pressure injuries are caused by a lack of blood supply to an area of skin. These injuries begin as a reddened area on the skin and can become an open sore. They can result from intense pressure over a short period of time or fromless pressure over a long period of time. Pressure injuries can vary in severity. They can cause pain, muscle damage, andinfection. What can increase my risk? This condition is more likely to develop in people who: Are in the hospital or an extended care facility. Are bedridden or in a wheelchair. Have an injury or disease that keeps them from: Moving normally. Feeling pain or pressure. Communicating if they feel pain or pressure. Have a condition that: Makes them sleepy or less alert. Causes poor blood flow. Need to wear a medical device. Have poor control of their bladder or bowel functions (incontinence). Have poor nutrition (malnutrition). Have had this condition before. Are of certain ethnicities. People of African American, Latino, or Hispanic descent are at higher risk compared to other ethnic groups. What actions can I take to prevent pressure injuries? Reducing and redistributing pressure Do not lie or sit in one position for a long time. Move or change position: Every hour when out of  bed in a chair. Every two hours when in bed. As often as told by your health care provider. Use pillows, wedges, or cushions to redistribute pressure. Ask your health care provider to recommend a mattress, cushions, or pads for you. Use medical devices that do not rub your skin. Tell your health care provider if one of your medical devices is causing pain or irritation. Skin care If you are in the hospital, your health care providers: Will inspect your skin, including areas under or around medical devices, at least twice a day. May recommend that you use certain types of bedding to help prevent pressure injuries. These may include a pad, mattress, or chair cushion that is filled with gel, air, water, or foam. Will evaluate your nutrition and consult a dietitian if needed. Will inspect and change any wound dressings regularly. May help you move into different positions every few hours. Will adjust any medical devices and braces as needed to limit pressure on your skin. Will keep your skin clean and dry. May use gentle cleansers and skin protectants if you are incontinent. Will moisturize any dry skin. In general, at home: Keep your skin clean and dry. Gently pat your skin dry. Do not rub or massage bony areas of your skin. Moisturize dry skin. Use gentle cleansers and skin protectants routinely if you are incontinent. Check your skin at least once a day for any changes in color and for any new blisters or sores. Make sure to check under and around any medical devices and between skin folds. Have a caregiver do this for you if you are not  able.  Lifestyle Be as active as you can every day. Ask your health care provider to suggest safe exercises or activities. Do not abuse drugs or alcohol. Do not use any products that contain nicotine or tobacco, such as cigarettes, e-cigarettes, and chewing tobacco. If you need help quitting, ask your health care provider. General instructions  Take  over-the-counter and prescription medicines only as told by your health care provider. Work with your health care provider to manage any chronic health conditions. Eat a healthy diet that includes protein, vitamins, and minerals. Ask your health care provider what types of food you should eat. Drink enough fluid to keep your urine pale yellow. Keep all follow-up visits as told by your health care provider. This is important.  Contact a health care provider if you: Feel or see any changes in your skin. Summary A pressure injury, sometimes called a bedsore or a pressure ulcer, is an injury to the skin and underlying tissue caused by pressure. Do not lie or sit in one position for a long time. Check your skin at least once a day for any changes in color and for any new blisters or sores. Make sure to check under and around any medical devices and between skin folds. Have a caregiver do this for you if you are not able. Eat a healthy diet that includes protein, vitamins, and minerals. Ask your health care provider what types of food you should eat. This information is not intended to replace advice given to you by your health care provider. Make sure you discuss any questions you have with your healthcare provider. Document Revised: 02/25/2019 Document Reviewed: 07/27/2018 Elsevier Patient Education  [place].

## 2021-05-15 ENCOUNTER — Other Ambulatory Visit: Payer: Self-pay

## 2021-05-15 ENCOUNTER — Ambulatory Visit (INDEPENDENT_AMBULATORY_CARE_PROVIDER_SITE_OTHER): Payer: Medicare Other | Admitting: *Deleted

## 2021-05-15 DIAGNOSIS — I480 Paroxysmal atrial fibrillation: Secondary | ICD-10-CM | POA: Diagnosis not present

## 2021-05-15 DIAGNOSIS — Z5181 Encounter for therapeutic drug level monitoring: Secondary | ICD-10-CM

## 2021-05-15 DIAGNOSIS — D649 Anemia, unspecified: Secondary | ICD-10-CM

## 2021-05-15 LAB — LIPID PANEL
Cholesterol: 108 mg/dL (ref 0–200)
HDL: 42 mg/dL (ref >39.00)
LDL Cholesterol: 47 mg/dL (ref 0–99)
NonHDL: 66.34
Total CHOL/HDL Ratio: 3
Triglycerides: 96 mg/dL (ref 0.0–149.0)
VLDL: 19.2 mg/dL (ref 0.0–40.0)

## 2021-05-15 LAB — COMPREHENSIVE METABOLIC PANEL
ALT: 9 U/L (ref 0–53)
AST: 12 U/L (ref 0–37)
Albumin: 3.8 g/dL (ref 3.5–5.2)
Alkaline Phosphatase: 35 U/L — ABNORMAL LOW (ref 39–117)
BUN: 22 mg/dL (ref 6–23)
CO2: 30 mEq/L (ref 19–32)
Calcium: 9.5 mg/dL (ref 8.4–10.5)
Chloride: 105 mEq/L (ref 96–112)
Creatinine, Ser: 1.01 mg/dL (ref 0.40–1.50)
GFR: 66.58 mL/min (ref >60.00)
Glucose, Bld: 116 mg/dL — ABNORMAL HIGH (ref 70–99)
Potassium: 4.8 mEq/L (ref 3.5–5.1)
Sodium: 142 mEq/L (ref 135–145)
Total Bilirubin: 0.4 mg/dL (ref 0.2–1.2)
Total Protein: 6.3 g/dL (ref 6.0–8.3)

## 2021-05-15 LAB — POCT INR: INR: 2.8 (ref 2.0–3.0)

## 2021-05-20 NOTE — Assessment & Plan Note (Signed)
He developed bilateral pressure sores and is now following with wound management the right is much better and the left is improving.

## 2021-05-20 NOTE — Assessment & Plan Note (Signed)
Is here today with a caregiver who helps him stay in his home and take care of basic activities he reports he is doing well

## 2021-05-20 NOTE — Assessment & Plan Note (Signed)
Hydrate and monitor 

## 2021-05-20 NOTE — Assessment & Plan Note (Signed)
Well controlled, no changes to meds. Encouraged heart healthy diet such as the DASH diet and exercise as tolerated.  °

## 2021-05-20 NOTE — Assessment & Plan Note (Signed)
Encourage heart healthy diet such as MIND or DASH diet, increase exercise, avoid trans fats, simple carbohydrates and processed foods, consider a krill or fish or flaxseed oil cap daily.  Tolerating Rosuvastatin 

## 2021-05-29 ENCOUNTER — Ambulatory Visit (INDEPENDENT_AMBULATORY_CARE_PROVIDER_SITE_OTHER): Payer: Medicare Other | Admitting: Cardiovascular Disease

## 2021-05-29 DIAGNOSIS — Z5181 Encounter for therapeutic drug level monitoring: Secondary | ICD-10-CM | POA: Diagnosis not present

## 2021-05-29 LAB — POCT INR: INR: 2.2 (ref 2.0–3.0)

## 2021-05-29 NOTE — Patient Instructions (Addendum)
Description   Spoke with Randell Patient, RN with [facility] while in home with pt instructed pt to continue taking 1/2 tablet daily except 1 tablet on Fridays. Recheck  INR in 2 weeks by [facility] [phone]).

## 2021-05-31 ENCOUNTER — Emergency Department (HOSPITAL_COMMUNITY): Payer: Medicare Other

## 2021-05-31 ENCOUNTER — Other Ambulatory Visit: Payer: Self-pay

## 2021-05-31 ENCOUNTER — Inpatient Hospital Stay (HOSPITAL_COMMUNITY)
Admission: EM | Admit: 2021-05-31 | Discharge: 2021-06-04 | DRG: 872 | Disposition: A | Discharge status: Skilled Nursing Facility | Payer: Medicare Other | Attending: Internal Medicine | Admitting: Internal Medicine

## 2021-05-31 ENCOUNTER — Encounter (HOSPITAL_COMMUNITY): Payer: Self-pay

## 2021-05-31 DIAGNOSIS — G609 Hereditary and idiopathic neuropathy, unspecified: Secondary | ICD-10-CM | POA: Diagnosis present

## 2021-05-31 DIAGNOSIS — Z9079 Acquired absence of other genital organ(s): Secondary | ICD-10-CM

## 2021-05-31 DIAGNOSIS — Z7901 Long term (current) use of anticoagulants: Secondary | ICD-10-CM

## 2021-05-31 DIAGNOSIS — L89522 Pressure ulcer of left ankle, stage 2: Secondary | ICD-10-CM | POA: Diagnosis present

## 2021-05-31 DIAGNOSIS — I878 Other specified disorders of veins: Secondary | ICD-10-CM | POA: Diagnosis present

## 2021-05-31 DIAGNOSIS — Z8249 Family history of ischemic heart disease and other diseases of the circulatory system: Secondary | ICD-10-CM

## 2021-05-31 DIAGNOSIS — L89512 Pressure ulcer of right ankle, stage 2: Secondary | ICD-10-CM | POA: Diagnosis present

## 2021-05-31 DIAGNOSIS — Z85820 Personal history of malignant melanoma of skin: Secondary | ICD-10-CM

## 2021-05-31 DIAGNOSIS — E872 Acidosis: Secondary | ICD-10-CM | POA: Diagnosis present

## 2021-05-31 DIAGNOSIS — D696 Thrombocytopenia, unspecified: Secondary | ICD-10-CM | POA: Diagnosis present

## 2021-05-31 DIAGNOSIS — L8990 Pressure ulcer of unspecified site, unspecified stage: Secondary | ICD-10-CM | POA: Insufficient documentation

## 2021-05-31 DIAGNOSIS — I071 Rheumatic tricuspid insufficiency: Secondary | ICD-10-CM | POA: Diagnosis present

## 2021-05-31 DIAGNOSIS — I480 Paroxysmal atrial fibrillation: Secondary | ICD-10-CM | POA: Diagnosis present

## 2021-05-31 DIAGNOSIS — B9620 Unspecified Escherichia coli [E. coli] as the cause of diseases classified elsewhere: Secondary | ICD-10-CM | POA: Diagnosis present

## 2021-05-31 DIAGNOSIS — R6510 Systemic inflammatory response syndrome (SIRS) of non-infectious origin without acute organ dysfunction: Secondary | ICD-10-CM

## 2021-05-31 DIAGNOSIS — Z20822 Contact with and (suspected) exposure to covid-19: Secondary | ICD-10-CM | POA: Diagnosis present

## 2021-05-31 DIAGNOSIS — Z79899 Other long term (current) drug therapy: Secondary | ICD-10-CM

## 2021-05-31 DIAGNOSIS — N179 Acute kidney failure, unspecified: Secondary | ICD-10-CM | POA: Diagnosis present

## 2021-05-31 DIAGNOSIS — Z9889 Other specified postprocedural states: Secondary | ICD-10-CM

## 2021-05-31 DIAGNOSIS — N390 Urinary tract infection, site not specified: Secondary | ICD-10-CM | POA: Diagnosis present

## 2021-05-31 DIAGNOSIS — E7800 Pure hypercholesterolemia, unspecified: Secondary | ICD-10-CM | POA: Diagnosis present

## 2021-05-31 DIAGNOSIS — I110 Hypertensive heart disease with heart failure: Secondary | ICD-10-CM | POA: Diagnosis present

## 2021-05-31 DIAGNOSIS — Z8546 Personal history of malignant neoplasm of prostate: Secondary | ICD-10-CM

## 2021-05-31 DIAGNOSIS — I4891 Unspecified atrial fibrillation: Secondary | ICD-10-CM | POA: Diagnosis present

## 2021-05-31 DIAGNOSIS — I5042 Chronic combined systolic (congestive) and diastolic (congestive) heart failure: Secondary | ICD-10-CM | POA: Diagnosis present

## 2021-05-31 DIAGNOSIS — Z86010 Personal history of colonic polyps: Secondary | ICD-10-CM | POA: Diagnosis not present

## 2021-05-31 DIAGNOSIS — R0902 Hypoxemia: Secondary | ICD-10-CM | POA: Diagnosis present

## 2021-05-31 DIAGNOSIS — A419 Sepsis, unspecified organism: Principal | ICD-10-CM | POA: Diagnosis present

## 2021-05-31 DIAGNOSIS — M170 Bilateral primary osteoarthritis of knee: Secondary | ICD-10-CM | POA: Diagnosis present

## 2021-05-31 LAB — URINALYSIS, ROUTINE W REFLEX MICROSCOPIC
Bilirubin Urine: NEGATIVE
Glucose, UA: NEGATIVE mg/dL
Ketones, ur: NEGATIVE mg/dL
Nitrite: NEGATIVE
Protein, ur: >=300 mg/dL — AB
RBC / HPF: >50 RBC/hpf — ABNORMAL HIGH (ref 0–5)
Specific Gravity, Urine: 1.014 (ref 1.005–1.030)
WBC, UA: >50 WBC/hpf — ABNORMAL HIGH (ref 0–5)
pH: 5 (ref 5.0–8.0)

## 2021-05-31 LAB — CBC WITH DIFFERENTIAL/PLATELET
Abs Immature Granulocytes: 0 10*3/uL (ref 0.00–0.07)
Band Neutrophils: 2 %
Basophils Absolute: 0 10*3/uL (ref 0.0–0.1)
Basophils Relative: 0 %
Eosinophils Absolute: 0 10*3/uL (ref 0.0–0.5)
Eosinophils Relative: 0 %
HCT: 42 % (ref 39.0–52.0)
Hemoglobin: 13.3 g/dL (ref 13.0–17.0)
Lymphocytes Relative: 6 %
Lymphs Abs: 1.4 10*3/uL (ref 0.7–4.0)
MCH: 28.5 pg (ref 26.0–34.0)
MCHC: 31.7 g/dL (ref 30.0–36.0)
MCV: 89.9 fL (ref 80.0–100.0)
Monocytes Absolute: 4.1 10*3/uL — ABNORMAL HIGH (ref 0.1–1.0)
Monocytes Relative: 18 %
Neutro Abs: 17.2 10*3/uL — ABNORMAL HIGH (ref 1.7–7.7)
Neutrophils Relative %: 74 %
Platelets: 62 10*3/uL — ABNORMAL LOW (ref 150–400)
RBC: 4.67 MIL/uL (ref 4.22–5.81)
RDW: 16.6 % — ABNORMAL HIGH (ref 11.5–15.5)
WBC: 22.6 10*3/uL — ABNORMAL HIGH (ref 4.0–10.5)
nRBC: 0 % (ref 0.0–0.2)

## 2021-05-31 LAB — COMPREHENSIVE METABOLIC PANEL
ALT: 16 U/L (ref 0–44)
AST: 42 U/L — ABNORMAL HIGH (ref 15–41)
Albumin: 4 g/dL (ref 3.5–5.0)
Alkaline Phosphatase: 37 U/L — ABNORMAL LOW (ref 38–126)
Anion gap: 10 (ref 5–15)
BUN: 34 mg/dL — ABNORMAL HIGH (ref 8–23)
CO2: 28 mmol/L (ref 22–32)
Calcium: 9.3 mg/dL (ref 8.9–10.3)
Chloride: 104 mmol/L (ref 98–111)
Creatinine, Ser: 1.59 mg/dL — ABNORMAL HIGH (ref 0.61–1.24)
GFR, Estimated: 41 mL/min — ABNORMAL LOW (ref >60)
Glucose, Bld: 161 mg/dL — ABNORMAL HIGH (ref 70–99)
Potassium: 4.7 mmol/L (ref 3.5–5.1)
Sodium: 142 mmol/L (ref 135–145)
Total Bilirubin: 1.6 mg/dL — ABNORMAL HIGH (ref 0.3–1.2)
Total Protein: 7.2 g/dL (ref 6.5–8.1)

## 2021-05-31 LAB — RESP PANEL BY RT-PCR (FLU A&B, COVID) ARPGX2
Influenza A by PCR: NEGATIVE
Influenza B by PCR: NEGATIVE
SARS Coronavirus 2 by RT PCR: NEGATIVE

## 2021-05-31 LAB — LACTIC ACID, PLASMA
Lactic Acid, Venous: 2.2 mmol/L (ref 0.5–1.9)
Lactic Acid, Venous: 2.8 mmol/L (ref 0.5–1.9)
Lactic Acid, Venous: 3.3 mmol/L (ref 0.5–1.9)
Lactic Acid, Venous: 1.6 mmol/L (ref 0.5–1.9)

## 2021-05-31 LAB — BRAIN NATRIURETIC PEPTIDE: B Natriuretic Peptide: 1018 pg/mL — ABNORMAL HIGH (ref 0.0–100.0)

## 2021-05-31 LAB — ETHANOL: Alcohol, Ethyl (B): <10 mg/dL (ref <10)

## 2021-05-31 LAB — PROTIME-INR
INR: 2.3 — ABNORMAL HIGH (ref 0.8–1.2)
Prothrombin Time: 24.9 seconds — ABNORMAL HIGH (ref 11.4–15.2)

## 2021-05-31 LAB — LIPASE, BLOOD: Lipase: 28 U/L (ref 11–51)

## 2021-05-31 MED ORDER — AMIODARONE HCL 100 MG PO TABS
100.0000 mg | ORAL_TABLET | Freq: Every day | ORAL | Status: DC
  Administered 2021-06-01 – 2021-06-04 (×4): 100 mg via ORAL
  Filled 2021-05-31 (×4): qty 1

## 2021-05-31 MED ORDER — WARFARIN - PHARMACIST DOSING INPATIENT: Freq: Every day | Status: DC

## 2021-05-31 MED ORDER — FLUTICASONE PROPIONATE 50 MCG/ACT NA SUSP
2.0000 | Freq: Every day | NASAL | Status: DC
  Administered 2021-06-01 – 2021-06-04 (×4): 2 via NASAL
  Filled 2021-05-31: qty 16

## 2021-05-31 MED ORDER — SODIUM CHLORIDE 0.9 % IV SOLN
1.0000 g | INTRAVENOUS | Status: DC
  Administered 2021-06-01 – 2021-06-04 (×4): 1 g via INTRAVENOUS
  Filled 2021-05-31 (×4): qty 1

## 2021-05-31 MED ORDER — ACETAMINOPHEN 650 MG RE SUPP: 650.0000 mg | Freq: Four times a day (QID) | RECTAL | Status: DC | PRN

## 2021-05-31 MED ORDER — ACETAMINOPHEN 325 MG PO TABS
650.0000 mg | ORAL_TABLET | Freq: Four times a day (QID) | ORAL | Status: DC | PRN
  Administered 2021-05-31: 650 mg via ORAL
  Filled 2021-05-31 (×2): qty 2

## 2021-05-31 MED ORDER — ROSUVASTATIN CALCIUM 5 MG PO TABS
5.0000 mg | ORAL_TABLET | Freq: Every day | ORAL | Status: DC
  Administered 2021-06-01 – 2021-06-04 (×4): 5 mg via ORAL
  Filled 2021-05-31 (×4): qty 1

## 2021-05-31 MED ORDER — ACETAMINOPHEN 325 MG PO TABS
650.0000 mg | ORAL_TABLET | Freq: Once | ORAL | Status: AC
  Administered 2021-05-31: 650 mg via ORAL
  Filled 2021-05-31: qty 2

## 2021-05-31 MED ORDER — TRAZODONE HCL 100 MG PO TABS
200.0000 mg | ORAL_TABLET | Freq: Every day | ORAL | Status: DC
  Administered 2021-05-31 – 2021-06-03 (×4): 200 mg via ORAL
  Filled 2021-05-31 (×4): qty 2

## 2021-05-31 MED ORDER — CYCLOSPORINE 0.05 % OP EMUL
1.0000 [drp] | Freq: Two times a day (BID) | OPHTHALMIC | Status: DC
  Administered 2021-05-31 – 2021-06-04 (×8): 1 [drp] via OPHTHALMIC
  Filled 2021-05-31 (×9): qty 1

## 2021-05-31 MED ORDER — WARFARIN SODIUM 5 MG PO TABS
5.0000 mg | ORAL_TABLET | ORAL | Status: DC
  Administered 2021-05-31: 5 mg via ORAL
  Filled 2021-05-31: qty 1

## 2021-05-31 MED ORDER — GABAPENTIN 300 MG PO CAPS
300.0000 mg | ORAL_CAPSULE | Freq: Two times a day (BID) | ORAL | Status: DC
  Administered 2021-05-31 – 2021-06-04 (×8): 300 mg via ORAL
  Filled 2021-05-31 (×8): qty 1

## 2021-05-31 MED ORDER — WARFARIN SODIUM 2.5 MG PO TABS
2.5000 mg | ORAL_TABLET | ORAL | Status: DC
  Administered 2021-06-01: 2.5 mg via ORAL
  Filled 2021-05-31: qty 1

## 2021-05-31 MED ORDER — SODIUM CHLORIDE 0.9 % IV BOLUS
500.0000 mL | Freq: Once | INTRAVENOUS | Status: AC
  Administered 2021-05-31: 500 mL via INTRAVENOUS

## 2021-05-31 MED ORDER — SODIUM CHLORIDE 0.9 % IV SOLN
1.0000 g | Freq: Once | INTRAVENOUS | Status: AC
  Administered 2021-05-31: 1 g via INTRAVENOUS
  Filled 2021-05-31: qty 10

## 2021-05-31 NOTE — ED Provider Notes (Signed)
[facility] DEPT Provider Note   CSN: [id number] Arrival date & time: 05/31/21  8676     History Chief Complaint  Patient presents with   Weakness    [name] [name] is a 87 y.o. male.  HPI Patient presents with weakness. Patient knowledges multiple medical issues, states that his been taking his medication as directed.  Yesterday, and again today he has felt progressively, generally weak without focality. No fever, no cough, no vomiting, no diarrhea, no fall. No clear alleviating or exacerbating factors.    Past Medical History:  Diagnosis Date   Abnormality of gait 05/27/2016   Adenomatous polyps    Carpal tunnel syndrome 06/25/2016   Right   Depressive disorder, not elsewhere classified    First degree AV block    Holter 3/18: NSR, PACs, PVCs, no AFib, no pauses.   Hereditary and idiopathic peripheral neuropathy 06/25/2016   Hypertension    Internal nasal lesion 05/15/2013   Melanoma ([place])    Left Shoulder   Mitral regurgitation    MVP (mitral valve prolapse)    a. With severe MR s/p Complex valvuloplasty including artificial Gore-tex neochord placement x4, chordal transposition x1, chordal release x1, # 32 mm Sorin Memo 3D Ring Annuloplasty 2012. // b. Echo 2/18: mild LVH, EF 50-55, mild AI, MV repair with mild MR, mod LAE, mod RVE, severe RAE, severe TR   Neuropathy    Normal coronary arteries    a. Normal coronary anatomy by cath 2012.   Osteoarthritis    Knees   PAF (paroxysmal atrial fibrillation) ([place])    a. Post-op MVR 2012.   Personal history of colonic polyps    Prostate cancer ([place])    Pulmonary HTN ([place])    a. Mild-mod by cath 2012.   Pure hypercholesterolemia    PVC (premature ventricular contraction)    Thrombocytopenia ([facility])    Vision abnormalities    Cornea scarring    Patient Active Problem List   Diagnosis Date Noted   Popliteal cyst, right 03/19/2021   Bimalleolar fracture of right ankle 01/10/2021   Pain in  right foot 12/25/2020   Pain in right ankle and joints of right foot 12/25/2020   Hyperglycemia 08/14/2020   High serum vitamin B12 08/14/2020   Anemia 03/11/2020   Spinal stenosis of lumbar region with neurogenic claudication 01/11/2020   Breast mass in male 09/29/2019   Hypoxia 04/01/2019   Knee pain, bilateral 08/08/2018   Mass of right hand 04/12/2018   Chronic renal insufficiency 10/06/2017   Spondylosis without myelopathy or radiculopathy, lumbar region 09/16/2017   Hypokalemia 04/30/2017   PAH (pulmonary artery hypertension) ([place])    Right-sided heart failure ([place]) 04/26/2017   Chronic diastolic heart failure ([place]) 04/21/2017   Pulmonary hypertension, primary ([place]) 04/21/2017   Severe tricuspid regurgitation 03/12/2017   Bilateral lower extremity edema 02/25/2017   Anticoagulated 02/25/2017   Varicose veins of right lower extremity with complications 72/07/4708   Obstructive lung disease (generalized) ([place]) 09/26/2016   Carpal tunnel syndrome 06/25/2016   Hereditary and idiopathic peripheral neuropathy 06/25/2016   Paresthesia 05/27/2016   Abnormality of gait 05/27/2016   Constipation 06/13/2015   Encounter for therapeutic drug monitoring 05/25/2014   Syncope 04/03/2014   Low back pain 04/19/2013   Melanoma ([place]) 09/30/2012   Cough 03/26/2012   Hx of mitral valve repair 11/19/2011   Long term (current) use of anticoagulants 11/03/2011   Pleural effusion due to congestive heart failure ([place]) 10/28/2011   Atrial  fibrillation ([place]) 10/07/2011   S/P mitral valve repair 10/01/2011   Valvular heart disease 08/21/2011   Hearing loss 04/09/2011   Thrombocytopenia ([place]) 09/19/2010   ADENOCARCINOMA, PROSTATE 09/17/2010   CALLUS, LEFT FOOT 09/17/2010   Backache 09/17/2010   TINEA PEDIS 05/28/2009   DERMATITIS, ATOPIC 04/10/2009   HYPERCHOLESTEROLEMIA 06/09/2008   DEPRESSION 06/09/2008   HYPERTENSION, BENIGN ESSENTIAL 06/09/2008   GERD 06/09/2008   OSTEOARTHRITIS,  GENERALIZED, MULTIPLE JOINTS 06/09/2008   MUSCLE SPASM, BACK 06/09/2008   H/O prostate cancer 06/09/2008   PERSONAL HISTORY OF MALIGNANT MELANOMA OF SKIN 06/09/2008   ARRHYTHMIA, HX OF 06/09/2008   Personal history of colonic polyps 06/09/2008    Past Surgical History:  Procedure Laterality Date   CARDIAC CATHETERIZATION  09/2011   Pre-op for MVR -- normal coronaries.   CARDIOVERSION N/A 01/02/2016   Procedure: CARDIOVERSION;  Surgeon: Thayer Headings, MD;  Location: [facility];  Service: Cardiovascular;  Laterality: N/A;   COLONOSCOPY W/ POLYPECTOMY     INGUINAL HERNIA REPAIR  09/2009   Left   KNEE ARTHROSCOPY      left x3  and right x2   Melanoma Surgery     2001, 2005, 2006, 2009   MITRAL VALVE REPAIR  10/01/2011   complex valvuloplasty with Goretex cord replacement and chordal transposition 36mm Sorin Memo 3D ring annuloplasty   Nuclear Stress Test  09/2006   EF-64%, Normal   PROSTATECTOMY  1993   RIGHT HEART CATH N/A 04/29/2017   Procedure: Right Heart Cath;  Surgeon: Jolaine Artist, MD;  Location: [facility];  Service: Cardiovascular;  Laterality: N/A;   ROOT CANAL  08-19-12   ROTATOR CUFF REPAIR  2003   left   TEE WITHOUT CARDIOVERSION  09/26/2011   Procedure: TRANSESOPHAGEAL ECHOCARDIOGRAM (TEE);  Surgeon: Lelon Perla, MD;  Location: [facility] ENDOSCOPY;  Service: Cardiovascular;  Laterality: N/A;   US ECHOCARDIOGRAPHY  09/2009, 08/1011   mild LVH,mild AI,MVP with mild MR, mild-mod. TR with mild Pulm. HTN, EF-55-60%       Family History  Problem Relation Age of Onset   Clotting disorder Brother        CVA's   Arthritis Mother    Hypertension Mother    Stroke Mother    Hypertension Father    Psychosis Father        psychiatric care   Colon cancer Neg Hx    Stomach cancer Neg Hx    Heart attack Neg Hx    Prostate cancer Neg Hx    Pancreatic cancer Neg Hx     Social History   Tobacco Use   Smoking status: Never   Smokeless tobacco: Never   Vaping Use   Vaping Use: Never used  Substance Use Topics   Alcohol use: No    Alcohol/week: 0.0 standard drinks    Comment: Last drink in 2000   Drug use: No    Home Medications Prior to Admission medications   Medication Sig Start Date End Date Taking? Authorizing Provider  acetaminophen (TYLENOL) 500 MG tablet Take 1,000 mg by mouth every 6 (six) hours as needed for mild pain.   Yes [provider]  amiodarone (PACERONE) 200 MG tablet Take 0.5 tablets (100 mg total) by mouth daily. 12/13/20  Yes Bensimhon, Shaune Pascal, MD  b complex vitamins tablet Take 1 tablet by mouth daily.   Yes [provider]  cycloSPORINE (RESTASIS) 0.05 % ophthalmic emulsion Place 1 drop into both eyes 2 (two) times daily.  Yes [provider]  fluticasone (FLONASE) 50 MCG/ACT nasal spray Place 2 sprays into both nostrils daily.  04/28/18  Yes [provider]  gabapentin (NEURONTIN) 300 MG capsule TAKE 1 CAPSULE BY MOUTH  TWICE DAILY Patient taking differently: Take 300 mg by mouth 2 (two) times daily. 01/07/21  Yes Magnus Sinning, MD  Multiple Vitamin (MULTIVITAMIN WITH MINERALS) TABS tablet Take 1 tablet by mouth every evening.    Yes [provider]  potassium chloride SA (KLOR-CON M20) 20 MEQ tablet Take 2 tablets (40 mEq total) by mouth daily. 09/17/20  Yes Bensimhon, Shaune Pascal, MD  rosuvastatin (CRESTOR) 5 MG tablet TAKE 1 TABLET BY MOUTH  DAILY Patient taking differently: Take 5 mg by mouth daily. 01/04/21  Yes Bensimhon, Shaune Pascal, MD  torsemide (DEMADEX) 20 MG tablet TAKE 2 TABLETS BY MOUTH  DAILY Patient taking differently: Take 20-40 mg by mouth daily. 04/25/21  Yes Bensimhon, Shaune Pascal, MD  traZODone (DESYREL) 100 MG tablet Take 200 mg by mouth at bedtime.   Yes [provider]  warfarin (COUMADIN) 5 MG tablet TAKE 1 TABLET BY MOUTH  DAILY AS DIRECTED BY THE  COUMADIN CLINIC Patient taking differently: Take 2.5-5 mg by mouth See admin instructions.  Taking 2.5 mg on all days except 5 mg on Friday. 10/10/20  Yes Bensimhon, Shaune Pascal, MD  pantoprazole (PROTONIX) 40 MG tablet Take 1 tablet (40 mg total) by mouth daily before breakfast. 10/16/11 06/29/19  Nani Skillern, PA-C    Allergies    Patient has no known allergies.  Review of Systems   Review of Systems  Constitutional:        Per HPI, otherwise negative  HENT:         Per HPI, otherwise negative  Respiratory:         Per HPI, otherwise negative  Cardiovascular:        Per HPI, otherwise negative  Gastrointestinal:  Negative for vomiting.  Endocrine:       Negative aside from HPI  Genitourinary:        Neg aside from HPI   Musculoskeletal:        Per HPI, otherwise negative  Skin:  Positive for wound.  Neurological:  Positive for weakness. Negative for syncope.   Physical Exam Updated Vital Signs BP (!) 108/55   Pulse 87   Temp (!) 100.4 F (38 C) (Oral)   Resp (!) 25   SpO2 96%   Physical Exam Vitals and nursing note reviewed.  Constitutional:      General: He is not in acute distress.    Appearance: He is well-developed.  HENT:     Head: Normocephalic and atraumatic.  Eyes:     Conjunctiva/sclera: Conjunctivae normal.  Cardiovascular:     Rate and Rhythm: Normal rate and regular rhythm.  Pulmonary:     Effort: Pulmonary effort is normal. No respiratory distress.     Breath sounds: No stridor.  Abdominal:     General: There is no distension.  Skin:    General: Skin is warm and dry.     Comments: Bilateral heel wounds  Neurological:     Mental Status: He is alert and oriented to person, place, and time.    ED Results / Procedures / Treatments   Labs (all labs ordered are listed, but only abnormal results are displayed) Labs Reviewed  COMPREHENSIVE METABOLIC PANEL - Abnormal; Notable for the following components:      Result Value  Glucose, Bld 161 (*)    BUN 34 (*)    Creatinine, Ser 1.59 (*)    AST 42 (*)    Alkaline Phosphatase 37  (*)    Total Bilirubin 1.6 (*)    GFR, Estimated 41 (*)    All other components within normal limits  BRAIN NATRIURETIC PEPTIDE - Abnormal; Notable for the following components:   B Natriuretic Peptide 1,018.0 (*)    All other components within normal limits  LACTIC ACID, PLASMA - Abnormal; Notable for the following components:   Lactic Acid, Venous 2.2 (*)    All other components within normal limits  CBC WITH DIFFERENTIAL/PLATELET - Abnormal; Notable for the following components:   WBC 22.6 (*)    RDW 16.6 (*)    Platelets 62 (*)    Neutro Abs 17.2 (*)    Monocytes Absolute 4.1 (*)    All other components within normal limits  URINALYSIS, ROUTINE W REFLEX MICROSCOPIC - Abnormal; Notable for the following components:   Color, Urine AMBER (*)    APPearance CLOUDY (*)    Hgb urine dipstick LARGE (*)    Protein, ur >=300 (*)    Leukocytes,Ua LARGE (*)    RBC / HPF >50 (*)    WBC, UA >50 (*)    Bacteria, UA RARE (*)    All other components within normal limits  PROTIME-INR - Abnormal; Notable for the following components:   Prothrombin Time 24.9 (*)    INR 2.3 (*)    All other components within normal limits  RESP PANEL BY RT-PCR (FLU A&B, COVID) ARPGX2  ETHANOL  LIPASE, BLOOD  LACTIC ACID, PLASMA   Cardiac 90s A. fib abnormal Pulse ox 99% with 2 L nasal cannula abnormal Reportedly the patient had 86% on room air. EKG EKG Interpretation  Date/Time:  Friday May 31 2021 10:10:05 EDT Ventricular Rate:  107 PR Interval:    QRS Duration: 100 QT Interval:  411 QTC Calculation: 549 R Axis:   93 Text Interpretation: Atrial fibrillation Ventricular premature complex Anterior infarct, old Prolonged QT interval Abnormal ECG Confirmed by Carmin Muskrat [phone]) on 05/31/2021 10:35:36 AM  Radiology DG Chest Port 1 View  Result Date: 05/31/2021 CLINICAL DATA:  87 year old male with weakness, atrial fibrillation. EXAM: PORTABLE CHEST 1 VIEW COMPARISON:  Chest radiograph 05/11/2019  and earlier. FINDINGS: Portable AP semi upright view at 1006 hours. Stable cardiomegaly and mediastinal contours. Previous sternotomy and valve replacement. Small left pleural effusion versus chronic pleural scarring, not significantly changed from 2020. No pneumothorax or pulmonary edema. Right lung appears clear. Visualized tracheal air column is within normal limits. No acute osseous abnormality identified. IMPRESSION: Cardiomegaly with small left pleural versus chronic pleural scarring. Electronically Signed   By: Genevie Ann M.D.   On: 05/31/2021 10:49    Procedures Procedures   Medications Ordered in ED Medications  warfarin (COUMADIN) tablet 5 mg (has no administration in time range)  warfarin (COUMADIN) tablet 2.5 mg (has no administration in time range)  Warfarin - Pharmacist Dosing Inpatient (has no administration in time range)  cefTRIAXone (ROCEPHIN) 1 g in sodium chloride 0.9 % 100 mL IVPB (has no administration in time range)  sodium chloride 0.9 % bolus 500 mL (500 mLs Intravenous New Bag/Given 05/31/21 1442)  acetaminophen (TYLENOL) tablet 650 mg (650 mg Oral Given 05/31/21 1439)    ED Course  I have reviewed the triage vital signs and the nursing notes.  Pertinent labs & imaging results that were available during  my care of the patient were reviewed by me and considered in my medical decision making (see chart for details).   Patient accompanied by his daughter. We discussed all findings. Labs notable for leukocytosis.  Patient has fever, and with tachycardia, meets SIRS criteria.  Blood pressure is stable, however. With suspicion for urinary tract infection, patient cannot provide sample. 3:14 PM UA consistent with infection.  Patient receiving ceftriaxone, will continue to receive fluids.  He does have a history of heart failure, but x-ray reviewed, not suggestive of pulmonary edema and the patient has clear lung sounds, complains of no respiratory difficulty he does however,  require oxygen. Given concern for SIRS, urinary tract infection, hypoxia, the patient will be admitted for further monitoring, management. MDM Rules/Calculators/A&P MDM Number of Diagnoses or Management Options AKI (acute kidney injury) ([place]): new, needed workup Hypoxia: new, needed workup Lower urinary tract infectious disease: new, needed workup SIRS (systemic inflammatory response syndrome) ([place]): new, needed workup   Amount and/or Complexity of Data Reviewed Clinical lab tests: ordered and reviewed Tests in the radiology section of CPT: reviewed and ordered Tests in the medicine section of CPT: reviewed and ordered Decide to obtain previous medical records or to obtain history from someone other than the patient: yes Obtain history from someone other than the patient: yes Review and summarize past medical records: yes Discuss the patient with other providers: yes Independent visualization of images, tracings, or specimens: yes  Risk of Complications, Morbidity, and/or Mortality Presenting problems: high Diagnostic procedures: high Management options: high  Critical Care Total time providing critical care: 30-74 minutes (35)  Patient Progress Patient progress: stable   Final Clinical Impression(s) / ED Diagnoses Final diagnoses:  SIRS (systemic inflammatory response syndrome) ([place])  Lower urinary tract infectious disease  AKI (acute kidney injury) ([place])  Hypoxia     Carmin Muskrat, MD 05/31/21 1515

## 2021-05-31 NOTE — ED Notes (Signed)
ED TO INPATIENT HANDOFF REPORT  Name/Age/Gender [name] [name] 87 y.o. male  Code Status    Code Status Orders  (From admission, onward)         Start     Ordered   05/31/21 1656  Full code  Continuous        05/31/21 1657        Code Status History    Date Active Date Inactive Code Status Order ID Comments User Context   04/01/2019 1933 04/07/2019 1538 Full Code [id number]  Gwynne Edinger, MD Inpatient   04/26/2017 2043 04/30/2017 1813 Full Code [id number]  Velvet Bathe, MD Inpatient   04/03/2014 0249 04/06/2014 1651 Full Code [id number]  Bynum Bellows, MD Inpatient    Advance Directive Documentation   Flowsheet Row Most Recent Value  Type of Advance Directive Healthcare Power of Attorney, Living will  Pre-existing out of facility DNR order (yellow form or pink MOST form) --  "MOST" Form in Place? --      Home/SNF/Other Home  Chief Complaint Sepsis ([place]) [A41.9]  Level of Care/Admitting Diagnosis ED Disposition    ED Disposition  Admit   Condition  --   [place]: [place] [100102]  Level of Care: Progressive [102]  Admit to Progressive based on following criteria: MULTISYSTEM THREATS such as stable sepsis, metabolic/electrolyte imbalance with or without encephalopathy that is responding to early treatment.  May admit patient to [facility] or [facility] if equivalent level of care is available:: No  Covid Evaluation: Confirmed COVID Negative  Diagnosis: Sepsis [facility]) [[id number]]  Admitting Physician: Rise Patience [phone]  Attending Physician: Rise Patience [phone]  Estimated length of stay: past midnight tomorrow  Certification:: I certify this patient will need inpatient services for at least 2 midnights         Medical History Past Medical History:  Diagnosis Date  . Abnormality of gait 05/27/2016  . Adenomatous polyps   . Carpal tunnel syndrome 06/25/2016   Right  . Depressive disorder, not elsewhere  classified   . First degree AV block    Holter 3/18: NSR, PACs, PVCs, no AFib, no pauses.  Marland Kitchen Hereditary and idiopathic peripheral neuropathy 06/25/2016  . Hypertension   . Internal nasal lesion 05/15/2013  . Melanoma ([place])    Left Shoulder  . Mitral regurgitation   . MVP (mitral valve prolapse)    a. With severe MR s/p Complex valvuloplasty including artificial Gore-tex neochord placement x4, chordal transposition x1, chordal release x1, # 32 mm Sorin Memo 3D Ring Annuloplasty 2012. // b. Echo 2/18: mild LVH, EF 50-55, mild AI, MV repair with mild MR, mod LAE, mod RVE, severe RAE, severe TR  . Neuropathy   . Normal coronary arteries    a. Normal coronary anatomy by cath 2012.  . Osteoarthritis    Knees  . PAF (paroxysmal atrial fibrillation) ([place])    a. Post-op MVR 2012.  Marland Kitchen Personal history of colonic polyps   . Prostate cancer ([place])   . Pulmonary HTN ([place])    a. Mild-mod by cath 2012.  . Pure hypercholesterolemia   . PVC (premature ventricular contraction)   . Thrombocytopenia ([place])   . Vision abnormalities    Cornea scarring    Allergies No Known Allergies  IV Location/Drains/Wounds Patient Lines/Drains/Airways Status    Active Line/Drains/Airways    Name Placement date Placement time Site Days   Peripheral IV 05/31/21 20 G Left Forearm 05/31/21  1040  Forearm  less than 1   External Urinary Catheter 04/06/19  2119  --  786   Pressure Injury Unstageable - Full thickness tissue loss in which the base of the ulcer is covered by slough (yellow, tan, gray, green or brown) and/or eschar (tan, brown or black) in the wound bed. appears to be a previous deep tissue injury which is ev --  --  -- --          Labs/Imaging Results for orders placed or performed during the hospital encounter of 05/31/21 (from the past 48 hour(s))  Comprehensive metabolic panel     Status: Abnormal   Collection Time: 05/31/21 10:41 AM  Result Value Ref Range   Sodium 142 135 - 145 mmol/L    Potassium 4.7 3.5 - 5.1 mmol/L   Chloride 104 98 - 111 mmol/L   CO2 28 22 - 32 mmol/L   Glucose, Bld 161 (H) 70 - 99 mg/dL    Comment: Glucose reference range applies only to samples taken after fasting for at least 8 hours.   BUN 34 (H) 8 - 23 mg/dL   Creatinine, Ser 1.59 (H) 0.61 - 1.24 mg/dL   Calcium 9.3 8.9 - 10.3 mg/dL   Total Protein 7.2 6.5 - 8.1 g/dL   Albumin 4.0 3.5 - 5.0 g/dL   AST 42 (H) 15 - 41 U/L   ALT 16 0 - 44 U/L   Alkaline Phosphatase 37 (L) 38 - 126 U/L   Total Bilirubin 1.6 (H) 0.3 - 1.2 mg/dL   GFR, Estimated 41 (L) >60 mL/min    Comment: (NOTE) Calculated using the CKD-EPI Creatinine Equation (2021)    Anion gap 10 5 - 15    Comment: Performed at [facility], [place] [address] [place], [place] [address]  Ethanol     Status: None   Collection Time: 05/31/21 10:41 AM  Result Value Ref Range   Alcohol, Ethyl (B) <10 <10 mg/dL    Comment: (NOTE) Lowest detectable limit for serum alcohol is 10 mg/dL.  For medical purposes only. Performed at [facility], [place] [address] [place], [place] [address]   Lipase, blood     Status: None   Collection Time: 05/31/21 10:41 AM  Result Value Ref Range   Lipase 28 11 - 51 U/L    Comment: Performed at [facility], [place] [address] [place], [place] [address]  Brain natriuretic peptide     Status: Abnormal   Collection Time: 05/31/21 10:41 AM  Result Value Ref Range   B Natriuretic Peptide 1,018.0 (H) 0.0 - 100.0 pg/mL    Comment: Performed at [facility], [place] [address] [place], [place] [address]  Lactic acid, plasma     Status: Abnormal   Collection Time: 05/31/21 10:41 AM  Result Value Ref Range   Lactic Acid, Venous 2.2 (HH) 0.5 - 1.9 mmol/L    Comment: CRITICAL RESULT CALLED TO, READ BACK BY AND VERIFIED WITH:  Janice Norrie, RN @ [phone] ON 05/31/2021 BY BROOKS, L  Performed at [facility], [place] [address]  [place], [place] [address]   CBC with Differential     Status: Abnormal   Collection Time: 05/31/21 10:41 AM  Result Value Ref Range   WBC 22.6 (H) 4.0 - 10.5 K/uL   RBC 4.67 4.22 - 5.81 MIL/uL   Hemoglobin 13.3 13.0 - 17.0 g/dL   HCT 42.0 39.0 - 52.0 %   MCV 89.9 80.0 -  100.0 fL   MCH 28.5 26.0 - 34.0 pg   MCHC 31.7 30.0 - 36.0 g/dL   RDW 16.6 (H) 11.5 - 15.5 %   Platelets 62 (L) 150 - 400 K/uL    Comment: Immature Platelet Fraction may be clinically indicated, consider ordering this additional test [id number]    nRBC 0.0 0.0 - 0.2 %   Neutrophils Relative % 74 %   Neutro Abs 17.2 (H) 1.7 - 7.7 K/uL   Band Neutrophils 2 %   Lymphocytes Relative 6 %   Lymphs Abs 1.4 0.7 - 4.0 K/uL   Monocytes Relative 18 %   Monocytes Absolute 4.1 (H) 0.1 - 1.0 K/uL   Eosinophils Relative 0 %   Eosinophils Absolute 0.0 0.0 - 0.5 K/uL   Basophils Relative 0 %   Basophils Absolute 0.0 0.0 - 0.1 K/uL   Abs Immature Granulocytes 0.00 0.00 - 0.07 K/uL    Comment: Performed at [facility], [place] [address], [place], [place] [address]  Protime-INR     Status: Abnormal   Collection Time: 05/31/21 10:41 AM  Result Value Ref Range   Prothrombin Time 24.9 (H) 11.4 - 15.2 seconds   INR 2.3 (H) 0.8 - 1.2    Comment: (NOTE) INR goal varies based on device and disease states. Performed at [facility], [place] [address], [place], [place] [address]   Resp Panel by RT-PCR (Flu A&B, Covid) Nasopharyngeal Swab     Status: None   Collection Time: 05/31/21 10:47 AM   Specimen: Nasopharyngeal Swab; Nasopharyngeal(NP) swabs in vial transport medium  Result Value Ref Range   SARS Coronavirus 2 by RT PCR NEGATIVE NEGATIVE    Comment: (NOTE) SARS-CoV-2 target nucleic acids are NOT DETECTED.  The SARS-CoV-2 RNA is generally detectable in upper respiratory specimens during the acute phase of infection. The lowest concentration of SARS-CoV-2 viral copies this assay can detect is 138  copies/mL. A negative result does not preclude SARS-Cov-2 infection and should not be used as the sole basis for treatment or other patient management decisions. A negative result may occur with  improper specimen collection/handling, submission of specimen other than nasopharyngeal swab, presence of viral mutation(s) within the areas targeted by this assay, and inadequate number of viral copies(<138 copies/mL). A negative result must be combined with clinical observations, patient history, and epidemiological information. The expected result is Negative.  Fact Sheet for Patients:  EntrepreneurPulse.com.au  Fact Sheet for Healthcare Providers:  IncredibleEmployment.be  This test is no t yet approved or cleared by the [place] FDA and  has been authorized for detection and/or diagnosis of SARS-CoV-2 by FDA under an Emergency Use Authorization (EUA). This EUA will remain  in effect (meaning this test can be used) for the duration of the COVID-19 declaration under Section 564(b)(1) of the Act, 21 U.S.C.section 360bbb-3(b)(1), unless the authorization is terminated  or revoked sooner.       Influenza A by PCR NEGATIVE NEGATIVE   Influenza B by PCR NEGATIVE NEGATIVE    Comment: (NOTE) The Xpert Xpress SARS-CoV-2/FLU/RSV plus assay is intended as an aid in the diagnosis of influenza from Nasopharyngeal swab specimens and should not be used as a sole basis for treatment. Nasal washings and aspirates are unacceptable for Xpert Xpress SARS-CoV-2/FLU/RSV testing.  Fact Sheet for Patients: EntrepreneurPulse.com.au  Fact Sheet for Healthcare Providers: IncredibleEmployment.be  This test is not yet approved or cleared by the [place] FDA and has been authorized for detection and/or diagnosis of  SARS-CoV-2 by FDA under an Emergency Use Authorization (EUA). This EUA will remain in effect (meaning this test  can be used) for the duration of the COVID-19 declaration under Section 564(b)(1) of the Act, 21 U.S.C. section 360bbb-3(b)(1), unless the authorization is terminated or revoked.  Performed at [facility], [place] [address] [place], [place] [address]   Lactic acid, plasma     Status: Abnormal   Collection Time: 05/31/21 12:04 PM  Result Value Ref Range   Lactic Acid, Venous 2.8 (HH) 0.5 - 1.9 mmol/L    Comment: CRITICAL VALUE NOTED.  VALUE IS CONSISTENT WITH PREVIOUSLY REPORTED AND CALLED VALUE. Performed at [facility], [place] [address] [place], [place] [address]   Urinalysis, Routine w reflex microscopic     Status: Abnormal   Collection Time: 05/31/21  2:27 PM  Result Value Ref Range   Color, Urine AMBER (A) YELLOW    Comment: BIOCHEMICALS MAY BE AFFECTED BY COLOR   APPearance CLOUDY (A) CLEAR   Specific Gravity, Urine 1.014 1.005 - 1.030   pH 5.0 5.0 - 8.0   Glucose, UA NEGATIVE NEGATIVE mg/dL   Hgb urine dipstick LARGE (A) NEGATIVE   Bilirubin Urine NEGATIVE NEGATIVE   Ketones, ur NEGATIVE NEGATIVE mg/dL   Protein, ur >=300 (A) NEGATIVE mg/dL   Nitrite NEGATIVE NEGATIVE   Leukocytes,Ua LARGE (A) NEGATIVE   RBC / HPF >50 (H) 0 - 5 RBC/hpf   WBC, UA >50 (H) 0 - 5 WBC/hpf   Bacteria, UA RARE (A) NONE SEEN   Squamous Epithelial / LPF 0-5 0 - 5   WBC Clumps PRESENT    Mucus PRESENT    Hyaline Casts, UA PRESENT     Comment: Performed at [facility], [place] [address], [place], [place] [address]   *Note: Due to a large number of results and/or encounters for the requested time period, some results have not been displayed. A complete set of results can be found in Results Review.   DG Chest Port 1 View  Result Date: 05/31/2021 CLINICAL DATA:  87 year old male with weakness, atrial fibrillation. EXAM: PORTABLE CHEST 1 VIEW COMPARISON:  Chest radiograph 05/11/2019 and earlier. FINDINGS: Portable AP semi upright view at 1006  hours. Stable cardiomegaly and mediastinal contours. Previous sternotomy and valve replacement. Small left pleural effusion versus chronic pleural scarring, not significantly changed from 2020. No pneumothorax or pulmonary edema. Right lung appears clear. Visualized tracheal air column is within normal limits. No acute osseous abnormality identified. IMPRESSION: Cardiomegaly with small left pleural versus chronic pleural scarring. Electronically Signed   By: Genevie Ann M.D.   On: 05/31/2021 10:49    Pending Labs Unresulted Labs (From admission, onward)    Start     Ordered   06/01/21 0500  CBC  Daily,   R      05/31/21 1413   06/01/21 0500  Protime-INR  Tomorrow morning,   R        05/31/21 1413   06/01/21 0500  Comprehensive metabolic panel  Tomorrow morning,   R        05/31/21 1657   05/31/21 1659  Lactic acid, plasma  STAT Now then every 2 hours,   STAT      05/31/21 1659          Vitals/Pain Today's Vitals   05/31/21 1500 05/31/21 1516 05/31/21 1525 05/31/21 1842  BP: 106/64   (!) 162/80  Pulse: 83   (!) 109  Resp: (!)  22   (!) 22  Temp:   99 F (37.2 C) 98.8 F (37.1 C)  TempSrc:   Oral Oral  SpO2: 97%   95%  PainSc:  0-No pain  0-No pain    Isolation Precautions No active isolations  Medications Medications  warfarin (COUMADIN) tablet 5 mg (5 mg Oral Given 05/31/21 1625)  warfarin (COUMADIN) tablet 2.5 mg (has no administration in time range)  Warfarin - Pharmacist Dosing Inpatient ( Does not apply Given by Other 05/31/21 1653)  amiodarone (PACERONE) tablet 100 mg (has no administration in time range)  rosuvastatin (CRESTOR) tablet 5 mg (has no administration in time range)  traZODone (DESYREL) tablet 200 mg (has no administration in time range)  gabapentin (NEURONTIN) capsule 300 mg (has no administration in time range)  fluticasone (FLONASE) 50 MCG/ACT nasal spray 2 spray (has no administration in time range)  cycloSPORINE (RESTASIS) 0.05 % ophthalmic emulsion 1 drop  (has no administration in time range)  acetaminophen (TYLENOL) tablet 650 mg (has no administration in time range)    Or  acetaminophen (TYLENOL) suppository 650 mg (has no administration in time range)  cefTRIAXone (ROCEPHIN) 1 g in sodium chloride 0.9 % 100 mL IVPB (has no administration in time range)  sodium chloride 0.9 % bolus 500 mL (0 mLs Intravenous Stopped 05/31/21 1618)  acetaminophen (TYLENOL) tablet 650 mg (650 mg Oral Given 05/31/21 1439)  cefTRIAXone (ROCEPHIN) 1 g in sodium chloride 0.9 % 100 mL IVPB (0 g Intravenous Stopped 05/31/21 1618)    Mobility non-ambulatory

## 2021-05-31 NOTE — ED Triage Notes (Addendum)
Pt arrived via EMS, c/o increasing weakness and dark urine x2 days, also endorses increased tremors. Denies any dysuria or any other sx.   84% RA per EMS, 88% RA in triage  2L [place] 96%

## 2021-05-31 NOTE — Progress Notes (Signed)
Pt had a note on his chart that he has a legal guardian. Pt and his daughter both state he does not have a legal guardian.Legal guardian note deactivated on pt's chart. Lucius Conn BSN, RN-BC Admissions RN 05/31/2021 3:52 PM

## 2021-05-31 NOTE — H&P (Signed)
History and Physical    [name] [name] ION:[id number] DOB: 1931-01-20 DOA: 05/31/2021  PCP: [name], MD  Patient coming from: Home.  Chief Complaint: Weakness and dark urine.  HPI: [name] [name] is a 86 y.o. male with history of chronic combined systolic diastolic heart failure status post mitral valve repair with severe tricuspid regurgitation with history of A. fib on Coumadin presents to the ER because of weakness going on for last 3 days with dark urine.  Patient feels in difficult to walk because of the weakness.  Denies any focal deficits.  Patient also has been having subjective feeling of fever chills.  Denies any chest pain or shortness of breath.  ED Course: In the ER patient labs show creatinine increased from 1.01 in June 28 about 2 weeks ago it is around 1.5 and WBC count 22.6 lactic acid 2.1 BNP of 1000.  Chest x-ray unremarkable UA is consistent with UTI.  Blood cultures obtained and started on empiric antibiotics for sepsis secondary UTI.  Patient was given 500 cc normal saline bolus for initial blood pressure 80 systolic which improved with fluids.  COVID test was negative.  Review of Systems: As per HPI, rest all negative.   Past Medical History:  Diagnosis Date   Abnormality of gait 05/27/2016   Adenomatous polyps    Carpal tunnel syndrome 06/25/2016   Right   Depressive disorder, not elsewhere classified    First degree AV block    Holter 3/18: NSR, PACs, PVCs, no AFib, no pauses.   Hereditary and idiopathic peripheral neuropathy 06/25/2016   Hypertension    Internal nasal lesion 05/15/2013   Melanoma ([place])    Left Shoulder   Mitral regurgitation    MVP (mitral valve prolapse)    a. With severe MR s/p Complex valvuloplasty including artificial Gore-tex neochord placement x4, chordal transposition x1, chordal release x1, # 32 mm Sorin Memo 3D Ring Annuloplasty 2012. // b. Echo 2/18: mild LVH, EF 50-55, mild AI, MV repair with mild MR, mod LAE, mod RVE, severe  RAE, severe TR   Neuropathy    Normal coronary arteries    a. Normal coronary anatomy by cath 2012.   Osteoarthritis    Knees   PAF (paroxysmal atrial fibrillation) ([place])    a. Post-op MVR 2012.   Personal history of colonic polyps    Prostate cancer ([place])    Pulmonary HTN ([place])    a. Mild-mod by cath 2012.   Pure hypercholesterolemia    PVC (premature ventricular contraction)    Thrombocytopenia (HCC)    Vision abnormalities    Cornea scarring    Past Surgical History:  Procedure Laterality Date   CARDIAC CATHETERIZATION  09/2011   Pre-op for MVR -- normal coronaries.   CARDIOVERSION N/A 01/02/2016   Procedure: CARDIOVERSION;  Surgeon: Thayer Headings, MD;  Location: [facility];  Service: Cardiovascular;  Laterality: N/A;   COLONOSCOPY W/ POLYPECTOMY     INGUINAL HERNIA REPAIR  09/2009   Left   KNEE ARTHROSCOPY      left x3  and right x2   Melanoma Surgery     2001, 2005, 2006, 2009   MITRAL VALVE REPAIR  10/01/2011   complex valvuloplasty with Goretex cord replacement and chordal transposition 33mm Sorin Memo 3D ring annuloplasty   Nuclear Stress Test  09/2006   EF-64%, Normal   PROSTATECTOMY  1993   RIGHT HEART CATH N/A 04/29/2017   Procedure: Right Heart Cath;  Surgeon: [name]  R, MD;  Location: [facility];  Service: Cardiovascular;  Laterality: N/A;   ROOT CANAL  08-19-12   ROTATOR CUFF REPAIR  2003   left   TEE WITHOUT CARDIOVERSION  09/26/2011   Procedure: TRANSESOPHAGEAL ECHOCARDIOGRAM (TEE);  Surgeon: Lelon Perla, MD;  Location: [facility] ENDOSCOPY;  Service: Cardiovascular;  Laterality: N/A;   US ECHOCARDIOGRAPHY  09/2009, 08/1011   mild LVH,mild AI,MVP with mild MR, mild-mod. TR with mild Pulm. HTN, EF-55-60%     reports that he has never smoked. He has never used smokeless tobacco. He reports that he does not drink alcohol and does not use drugs.  No Known Allergies  Family History  Problem Relation Age of Onset   Clotting disorder  Brother        CVA's   Arthritis Mother    Hypertension Mother    Stroke Mother    Hypertension Father    Psychosis Father        psychiatric care   Colon cancer Neg Hx    Stomach cancer Neg Hx    Heart attack Neg Hx    Prostate cancer Neg Hx    Pancreatic cancer Neg Hx     Prior to Admission medications   Medication Sig Start Date End Date Taking? Authorizing Provider  acetaminophen (TYLENOL) 500 MG tablet Take 1,000 mg by mouth every 6 (six) hours as needed for mild pain.   Yes [provider]  amiodarone (PACERONE) 200 MG tablet Take 0.5 tablets (100 mg total) by mouth daily. 12/13/20  Yes Bensimhon, Shaune Pascal, MD  b complex vitamins tablet Take 1 tablet by mouth daily.   Yes [provider]  cycloSPORINE (RESTASIS) 0.05 % ophthalmic emulsion Place 1 drop into both eyes 2 (two) times daily.   Yes [provider]  fluticasone (FLONASE) 50 MCG/ACT nasal spray Place 2 sprays into both nostrils daily.  04/28/18  Yes [provider]  gabapentin (NEURONTIN) 300 MG capsule TAKE 1 CAPSULE BY MOUTH  TWICE DAILY Patient taking differently: Take 300 mg by mouth 2 (two) times daily. 01/07/21  Yes Magnus Sinning, MD  Multiple Vitamin (MULTIVITAMIN WITH MINERALS) TABS tablet Take 1 tablet by mouth every evening.    Yes [provider]  potassium chloride SA (KLOR-CON M20) 20 MEQ tablet Take 2 tablets (40 mEq total) by mouth daily. 09/17/20  Yes Bensimhon, Shaune Pascal, MD  rosuvastatin (CRESTOR) 5 MG tablet TAKE 1 TABLET BY MOUTH  DAILY Patient taking differently: Take 5 mg by mouth daily. 01/04/21  Yes Bensimhon, Shaune Pascal, MD  torsemide (DEMADEX) 20 MG tablet TAKE 2 TABLETS BY MOUTH  DAILY Patient taking differently: Take 20-40 mg by mouth daily. 04/25/21  Yes Bensimhon, Shaune Pascal, MD  traZODone (DESYREL) 100 MG tablet Take 200 mg by mouth at bedtime.   Yes [provider]  warfarin (COUMADIN) 5 MG tablet TAKE 1 TABLET BY MOUTH  DAILY AS DIRECTED BY THE   COUMADIN CLINIC Patient taking differently: Take 2.5-5 mg by mouth See admin instructions. Taking 2.5 mg on all days except 5 mg on Friday. 10/10/20  Yes Bensimhon, Shaune Pascal, MD  pantoprazole (PROTONIX) 40 MG tablet Take 1 tablet (40 mg total) by mouth daily before breakfast. 10/16/11 06/29/19  Nani Skillern, PA-C    Physical Exam: Constitutional: Moderately built and nourished. Vitals:   05/31/21 1415 05/31/21 1445 05/31/21 1500 05/31/21 1525  BP: (!) 108/55 113/66 106/64   Pulse: 87 (!) 107 83   Resp: Marland Kitchen)  25 (!) 30 (!) 22   Temp:    99 F (37.2 C)  TempSrc:    Oral  SpO2: 96% 92% 97%    Eyes: Anicteric no pallor. ENMT: No discharge from the ears eyes nose and mouth. Neck: No mass felt.  No neck rigidity. Respiratory: No rhonchi or crepitations. Cardiovascular: S1-S2 heard. Abdomen: Soft nontender bowel sound present.  Possible periumbilical hernia obstruction with no obstruction features. Musculoskeletal: No edema. Skin: No rash. Neurologic: Alert awake oriented to time place and person.  Moves all extremities. Psychiatric: Appears normal.  Normal affect.   Labs on Admission: I have personally reviewed following labs and imaging studies  CBC: Recent Labs  Lab 05/31/21 1041  WBC 22.6*  NEUTROABS 17.2*  HGB 13.3  HCT 42.0  MCV 89.9  PLT 62*   Basic Metabolic Panel: Recent Labs  Lab 05/31/21 1041  NA 142  K 4.7  CL 104  CO2 28  GLUCOSE 161*  BUN 34*  CREATININE 1.59*  CALCIUM 9.3   GFR: CrCl cannot be calculated (Unknown ideal weight.). Liver Function Tests: Recent Labs  Lab 05/31/21 1041  AST 42*  ALT 16  ALKPHOS 37*  BILITOT 1.6*  PROT 7.2  ALBUMIN 4.0   Recent Labs  Lab 05/31/21 1041  LIPASE 28   No results for input(s): AMMONIA in the last 168 hours. Coagulation Profile: Recent Labs  Lab 05/29/21 0000 05/31/21 1041  INR 2.2 2.3*   Cardiac Enzymes: No results for input(s): CKTOTAL, CKMB, CKMBINDEX, TROPONINI in the last 168  hours. BNP (last 3 results) No results for input(s): PROBNP in the last 8760 hours. HbA1C: No results for input(s): HGBA1C in the last 72 hours. CBG: No results for input(s): GLUCAP in the last 168 hours. Lipid Profile: No results for input(s): CHOL, HDL, LDLCALC, TRIG, CHOLHDL, LDLDIRECT in the last 72 hours. Thyroid Function Tests: No results for input(s): TSH, T4TOTAL, FREET4, T3FREE, THYROIDAB in the last 72 hours. Anemia Panel: No results for input(s): VITAMINB12, FOLATE, FERRITIN, TIBC, IRON, RETICCTPCT in the last 72 hours. Urine analysis:    Component Value Date/Time   COLORURINE AMBER (A) 05/31/2021 1427   APPEARANCEUR CLOUDY (A) 05/31/2021 1427   LABSPEC 1.014 05/31/2021 1427   PHURINE 5.0 05/31/2021 1427   GLUCOSEU NEGATIVE 05/31/2021 1427   GLUCOSEU NEGATIVE 11/01/2018 1234   HGBUR LARGE (A) 05/31/2021 1427   BILIRUBINUR NEGATIVE 05/31/2021 1427   BILIRUBINUR negative 09/22/2017 1040   KETONESUR NEGATIVE 05/31/2021 1427   PROTEINUR >=300 (A) 05/31/2021 1427   UROBILINOGEN 0.2 11/01/2018 1234   NITRITE NEGATIVE 05/31/2021 1427   LEUKOCYTESUR LARGE (A) 05/31/2021 1427   Sepsis Labs: @LABRCNTIP (procalcitonin:4,lacticidven:4) ) Recent Results (from the past 240 hour(s))  Resp Panel by RT-PCR (Flu A&B, Covid) Nasopharyngeal Swab     Status: None   Collection Time: 05/31/21 10:47 AM   Specimen: Nasopharyngeal Swab; Nasopharyngeal(NP) swabs in vial transport medium  Result Value Ref Range Status   SARS Coronavirus 2 by RT PCR NEGATIVE NEGATIVE Final    Comment: (NOTE) SARS-CoV-2 target nucleic acids are NOT DETECTED.  The SARS-CoV-2 RNA is generally detectable in upper respiratory specimens during the acute phase of infection. The lowest concentration of SARS-CoV-2 viral copies this assay can detect is 138 copies/mL. A negative result does not preclude SARS-Cov-2 infection and should not be used as the sole basis for treatment or other patient management  decisions. A negative result may occur with  improper specimen collection/handling, submission of specimen other than nasopharyngeal swab, presence  of viral mutation(s) within the areas targeted by this assay, and inadequate number of viral copies(<138 copies/mL). A negative result must be combined with clinical observations, patient history, and epidemiological information. The expected result is Negative.  Fact Sheet for Patients:  EntrepreneurPulse.com.au  Fact Sheet for Healthcare Providers:  IncredibleEmployment.be  This test is no t yet approved or cleared by the [place] FDA and  has been authorized for detection and/or diagnosis of SARS-CoV-2 by FDA under an Emergency Use Authorization (EUA). This EUA will remain  in effect (meaning this test can be used) for the duration of the COVID-19 declaration under Section 564(b)(1) of the Act, 21 U.S.C.section 360bbb-3(b)(1), unless the authorization is terminated  or revoked sooner.       Influenza A by PCR NEGATIVE NEGATIVE Final   Influenza B by PCR NEGATIVE NEGATIVE Final    Comment: (NOTE) The Xpert Xpress SARS-CoV-2/FLU/RSV plus assay is intended as an aid in the diagnosis of influenza from Nasopharyngeal swab specimens and should not be used as a sole basis for treatment. Nasal washings and aspirates are unacceptable for Xpert Xpress SARS-CoV-2/FLU/RSV testing.  Fact Sheet for Patients: EntrepreneurPulse.com.au  Fact Sheet for Healthcare Providers: IncredibleEmployment.be  This test is not yet approved or cleared by the [place] FDA and has been authorized for detection and/or diagnosis of SARS-CoV-2 by FDA under an Emergency Use Authorization (EUA). This EUA will remain in effect (meaning this test can be used) for the duration of the COVID-19 declaration under Section 564(b)(1) of the Act, 21 U.S.C. section 360bbb-3(b)(1), unless the  authorization is terminated or revoked.  Performed at [facility], [place] [address], [place], [place] [address]      Radiological Exams on Admission: DG Chest Port 1 View  Result Date: 05/31/2021 CLINICAL DATA:  87 year old male with weakness, atrial fibrillation. EXAM: PORTABLE CHEST 1 VIEW COMPARISON:  Chest radiograph 05/11/2019 and earlier. FINDINGS: Portable AP semi upright view at 1006 hours. Stable cardiomegaly and mediastinal contours. Previous sternotomy and valve replacement. Small left pleural effusion versus chronic pleural scarring, not significantly changed from 2020. No pneumothorax or pulmonary edema. Right lung appears clear. Visualized tracheal air column is within normal limits. No acute osseous abnormality identified. IMPRESSION: Cardiomegaly with small left pleural versus chronic pleural scarring. Electronically Signed   By: Genevie Ann M.D.   On: 05/31/2021 10:49    EKG: Independently reviewed.  A. fib rate was 107 bpm.  Assessment/Plan Principal Problem:   Sepsis ([place]) Active Problems:   H/O prostate cancer   S/P mitral valve repair   Atrial fibrillation (HCC)   Lower urinary tract infectious disease   Severe tricuspid regurgitation   AKI (acute kidney injury) ([place])    Sepsis likely from UTI -patient initially was hypotensive tachycardic febrile with leukocytosis and elevated lactic acid consistent with sepsis source is likely UTI.  Patient blood pressure and heart rate improved with 5 cc normal saline bolus.  We will repeat lactic acid follow blood cultures continue empiric antibiotics. Acute renal failure likely from hypotension and sepsis which I think will improve with the fluid bolus given and will be holding torsemide.  Follow metabolic panel. History of chronic combined systolic and diastolic heart failure with history of severe tricuspid regurgitation mitral valve repair presently holding torsemide due to sepsis picture at presentation with  low blood pressure.  Closely monitor respiratory status. A. fib presently rate controlled initially was tachycardic.  We will continue amiodarone Coumadin to be dosed per pharmacy. Previous history  of prostate cancer with history of prostatectomy.  With sepsis presentation with acute UTI with acute renal failure will need close monitoring and inpatient status.   DVT prophylaxis: Coumadin. Code Status: Full code. Family Communication: Patient's daughter. Disposition Plan: Home when stable. Consults called: Physical therapy. Admission status: Inpatient.   Rise Patience MD [facility] Pager [phone].  If 7PM-7AM, please contact night-coverage [email or web address] Password [facility]  05/31/2021, 4:59 PM

## 2021-05-31 NOTE — Progress Notes (Addendum)
ANTICOAGULATION CONSULT NOTE - Initial Consult  Pharmacy Consult for warfarin Indication: atrial fibrillation  No Known Allergies  Patient Measurements:     Vital Signs: Temp: 100.4 F (38 C) (07/15 1007) Temp Source: Oral (07/15 1007) BP: 93/67 (07/15 1300) Pulse Rate: 88 (07/15 1300)  Labs: Recent Labs    05/29/21 0000 05/31/21 1041  HGB  --  13.3  HCT  --  42.0  PLT  --  62*  LABPROT  --  24.9*  INR 2.2 2.3*  CREATININE  --  1.59*    CrCl cannot be calculated (Unknown ideal weight.).   Medical History: Past Medical History:  Diagnosis Date   Abnormality of gait 05/27/2016   Adenomatous polyps    Carpal tunnel syndrome 06/25/2016   Right   Depressive disorder, not elsewhere classified    First degree AV block    Holter 3/18: NSR, PACs, PVCs, no AFib, no pauses.   Hereditary and idiopathic peripheral neuropathy 06/25/2016   Hypertension    Internal nasal lesion 05/15/2013   Melanoma ([place])    Left Shoulder   Mitral regurgitation    MVP (mitral valve prolapse)    a. With severe MR s/p Complex valvuloplasty including artificial Gore-tex neochord placement x4, chordal transposition x1, chordal release x1, # 32 mm Sorin Memo 3D Ring Annuloplasty 2012. // b. Echo 2/18: mild LVH, EF 50-55, mild AI, MV repair with mild MR, mod LAE, mod RVE, severe RAE, severe TR   Neuropathy    Normal coronary arteries    a. Normal coronary anatomy by cath 2012.   Osteoarthritis    Knees   PAF (paroxysmal atrial fibrillation) ([place])    a. Post-op MVR 2012.   Personal history of colonic polyps    Prostate cancer ([place])    Pulmonary HTN ([place])    a. Mild-mod by cath 2012.   Pure hypercholesterolemia    PVC (premature ventricular contraction)    Thrombocytopenia (HCC)    Vision abnormalities    Cornea scarring    Medications:  Scheduled:   [START ON 06/01/2021] warfarin  2.5 mg Oral Once per day on Sun Mon Tue Wed Thu Sat   warfarin  5 mg Oral Q Fri   Warfarin - Pharmacist Dosing  Inpatient   Does not apply q1600    Assessment: 87 yo male presenting with weakness.  Patient is on warfarin PTA for atrial fibrillation - per patient and ambulatory care flowsheet, regimen is warfarin 2.5mg  daily except for 5mg  on Fridays (has been on this regimen ~3 weeks).  Patient has been getting INR checked approximately every 2 weeks with stable INR.  Last dose warfarin was 7/14.  INR therapeutic at 2.3.  Hgb stable at 13.3, platelets are low at 68 however this appears to be chronic per chart review.  Patient did report dark urine x 2 days however no hematuria reported.   Goal of Therapy:  INR 2-3 Monitor platelets by anticoagulation protocol: Yes   Plan:  Continue home warfarin regimen of warfarin 2.5mg  daily except for 5mg  on Fridays Monitor daily INR for now - likely can de-escalate frequency if INR remains stable Monitor daily CBC, s/sx bleeding  Dimple Nanas, PharmD 05/31/2021 2:11 PM

## 2021-05-31 NOTE — ED Notes (Signed)
ED Provider at bedside. 

## 2021-05-31 NOTE — ED Notes (Signed)
Pt was wet so staff changed linen and put a brief and condom cath on pt.

## 2021-06-01 DIAGNOSIS — L8990 Pressure ulcer of unspecified site, unspecified stage: Secondary | ICD-10-CM | POA: Insufficient documentation

## 2021-06-01 LAB — CBC
HCT: 43.4 % (ref 39.0–52.0)
Hemoglobin: 13.1 g/dL (ref 13.0–17.0)
MCH: 27.3 pg (ref 26.0–34.0)
MCHC: 30.2 g/dL (ref 30.0–36.0)
MCV: 90.6 fL (ref 80.0–100.0)
Platelets: 54 10*3/uL — ABNORMAL LOW (ref 150–400)
RBC: 4.79 MIL/uL (ref 4.22–5.81)
RDW: 16.4 % — ABNORMAL HIGH (ref 11.5–15.5)
WBC: 19.6 10*3/uL — ABNORMAL HIGH (ref 4.0–10.5)
nRBC: 0 % (ref 0.0–0.2)

## 2021-06-01 LAB — COMPREHENSIVE METABOLIC PANEL
ALT: 21 U/L (ref 0–44)
AST: 44 U/L — ABNORMAL HIGH (ref 15–41)
Albumin: 3.6 g/dL (ref 3.5–5.0)
Alkaline Phosphatase: 39 U/L (ref 38–126)
Anion gap: 12 (ref 5–15)
BUN: 42 mg/dL — ABNORMAL HIGH (ref 8–23)
CO2: 28 mmol/L (ref 22–32)
Calcium: 9.1 mg/dL (ref 8.9–10.3)
Chloride: 101 mmol/L (ref 98–111)
Creatinine, Ser: 1.57 mg/dL — ABNORMAL HIGH (ref 0.61–1.24)
GFR, Estimated: 42 mL/min — ABNORMAL LOW (ref >60)
Glucose, Bld: 138 mg/dL — ABNORMAL HIGH (ref 70–99)
Potassium: 4.3 mmol/L (ref 3.5–5.1)
Sodium: 141 mmol/L (ref 135–145)
Total Bilirubin: 0.8 mg/dL (ref 0.3–1.2)
Total Protein: 6.9 g/dL (ref 6.5–8.1)

## 2021-06-01 LAB — PROTIME-INR
INR: 2.9 — ABNORMAL HIGH (ref 0.8–1.2)
Prothrombin Time: 30 seconds — ABNORMAL HIGH (ref 11.4–15.2)

## 2021-06-01 MED ORDER — LACTATED RINGERS IV SOLN: INTRAVENOUS | Status: DC

## 2021-06-01 MED ORDER — GERHARDT'S BUTT CREAM
TOPICAL_CREAM | Freq: Three times a day (TID) | CUTANEOUS | Status: DC
  Filled 2021-06-01: qty 1

## 2021-06-01 NOTE — Evaluation (Signed)
Physical Therapy Evaluation Patient Details Name: [name] [name] MRN: [id number] DOB: 1931-01-11 Today's Date: 06/01/2021   History of Present Illness  87 y.o. male  presents to the ER because of weakness going on for last 3 days with dark urine and weakness. Dx of UTI, sepsis, acute renal failure. Pt with history of chronic combined systolic diastolic heart failure status post mitral valve repair with severe tricuspid regurgitation with history of A. fib on Coumadin.  Clinical Impression  Pt admitted with above diagnosis. Pt required mod assist for bed mobility, max assist of 2 for sit to stand, then min assist to take several pivotal steps from the bed to recliner with RW.  Pt currently with functional limitations due to the deficits listed below (see PT Problem List). Pt will benefit from skilled PT to increase their independence and safety with mobility to allow discharge to the venue listed below.       Follow Up Recommendations SNF;Supervision for mobility/OOB;Home health PT (SNF vs home with 24* assist, depending on progress and level of assist available at home)    Equipment Recommendations  None recommended by PT    Recommendations for Other Services       Precautions / Restrictions Precautions Precautions: Fall Restrictions Weight Bearing Restrictions: No      Mobility  Bed Mobility Overal bed mobility: Needs Assistance Bed Mobility: Supine to Sit     Supine to sit: Mod assist     General bed mobility comments: mod A to raise trunk    Transfers Overall transfer level: Needs assistance Equipment used: Rolling walker (2 wheeled) Transfers: Sit to/from Stand Sit to Stand: +2 physical assistance;Max assist;From elevated surface         General transfer comment: assist to power up, VCs hand placement  Ambulation/Gait Ambulation/Gait assistance: +2 safety/equipment;Min assist Gait Distance (Feet): 3 Feet Assistive device: Rolling walker (2 wheeled) Gait  Pattern/deviations: Step-to pattern;Decreased stride length Gait velocity: decr   General Gait Details: pt took several pivotal steps from bed to recliner with RW, increased time, distance limited by B knee pain  Stairs            Wheelchair Mobility    Modified Rankin (Stroke Patients Only)       Balance Overall balance assessment: Needs assistance Sitting-balance support: Feet supported;No upper extremity supported Sitting balance-Leahy Scale: Fair     Standing balance support: Bilateral upper extremity supported Standing balance-Leahy Scale: Poor                               Pertinent Vitals/Pain Pain Assessment: Faces Faces Pain Scale: Hurts even more Pain Location: B knees with mobility Pain Descriptors / Indicators: Grimacing;Guarding;Sore Pain Intervention(s): Limited activity within patient's tolerance;Monitored during session;Repositioned;Relaxation    Home Living Family/patient expects to be discharged to:: Private residence Living Arrangements: Alone Available Help at Discharge: Personal care attendant;Available PRN/intermittently Type of Home: House Home Access: Stairs to enter Entrance Stairs-Rails: Can reach both;Left;Right Entrance Stairs-Number of Steps: 2 Home Layout: Two level Home Equipment: [place] - 2 wheels;Wheelchair - manual Additional Comments: has private aides that come 2x/day, they assist with bathing/dressing/housework    Prior Function Level of Independence: Needs assistance   Gait / Transfers Assistance Needed: walks wtih RW, but was unable to walk for one day prior to admission  ADL's / Homemaking Assistance Needed: assist from aide        Hand Dominance   Dominant Hand:  Right    Extremity/Trunk Assessment   Upper Extremity Assessment Upper Extremity Assessment: Defer to OT evaluation    Lower Extremity Assessment Lower Extremity Assessment: Generalized weakness (B knee ext -4/5)    Cervical / Trunk  Assessment Cervical / Trunk Assessment: Normal  Communication   Communication: HOH  Cognition Arousal/Alertness: Awake/alert Behavior During Therapy: WFL for tasks assessed/performed Overall Cognitive Status: Within Functional Limits for tasks assessed                                        General Comments      Exercises General Exercises - Lower Extremity Long Arc Quad: AROM;Both;5 reps;Seated Hip Flexion/Marching: AROM;Both;5 reps;Seated   Assessment/Plan    PT Assessment Patient needs continued PT services  PT Problem List Decreased strength;Decreased mobility;Decreased balance;Pain;Decreased activity tolerance       PT Treatment Interventions Gait training;Therapeutic activities;Therapeutic exercise;Patient/family education;Balance training    PT Goals (Current goals can be found in the Care Plan section)  Acute Rehab PT Goals Patient Stated Goal: to get stronger, go home PT Goal Formulation: With patient Time For Goal Achievement: 06/15/21 Potential to Achieve Goals: Good    Frequency Min 3X/week   Barriers to discharge        Co-evaluation               AM-PAC PT "6 Clicks" Mobility  Outcome Measure Help needed turning from your back to your side while in a flat bed without using bedrails?: A Lot Help needed moving from lying on your back to sitting on the side of a flat bed without using bedrails?: A Lot Help needed moving to and from a bed to a chair (including a wheelchair)?: A Lot Help needed standing up from a chair using your arms (e.g., wheelchair or bedside chair)?: Total Help needed to walk in hospital room?: A Lot Help needed climbing 3-5 steps with a railing? : Total 6 Click Score: 10    End of Session Equipment Utilized During Treatment: Gait belt;Oxygen Activity Tolerance: Patient limited by fatigue;Patient limited by pain Patient left: in chair;with call bell/phone within reach;with chair alarm set;with nursing/sitter  in room Nurse Communication: Mobility status;Need for lift equipment (try Stedy for getting out of recliner) PT Visit Diagnosis: Difficulty in walking, not elsewhere classified (R26.2);Pain;Muscle weakness (generalized) (M62.81) Pain - Right/Left:  (both) Pain - part of body: Knee    Time: 3546-5681 PT Time Calculation (min) (ACUTE ONLY): 19 min   Charges:   PT Evaluation $PT Eval Moderate Complexity: 1 Mod         Philomena Doheny PT 06/01/2021  Acute Rehabilitation Services Pager [phone] Office [phone]

## 2021-06-01 NOTE — Progress Notes (Signed)
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 fib on Coumadin presented to the ED for weakness x3 days with dark urine, patient had difficulty to walk because of weakness, no focal deficit and was having subjective fever chills no chest pain shortness of breath. He was seen in the ED blood work showed leukocytosis mild lactic acidosis BNP 1000 chest x-ray unremarkable UA consistent with UTI blood culture was sent patient was placed on IV antibiotics for UTI/sepsis and admitted for further work-up.  Subjective: Patient is requesting for bag of IV fluids feels dehydrated.  He is alert awake denies chest pain nausea vomiting cough fever chills abdominal pain. Still complains of being weak  Assessment & Plan:  Sepsis secondary to UTI POA: Patient met sepsis currently with hypotension lactic acidosis tachycardia leukocytosis.  Resolved-improving continue on empiric ceftriaxone leukocytosis pending further blood culture urine culture.  AKI in the setting of hypotension and sepsis.Creatinine is still up at 1.5 BUN also reported to, patient appears dry we will add gentle IV fluid hydration and monitor closely for any signs of fluid overload Recent Labs  Lab 05/31/21 1041 06/01/21 0508  BUN 34* 42*  CREATININE 1.59* 1.57*    A. fib currently rate controlled initially tachycardic in the ED.Cont amiodarone.Coumadin INR therapeutic pharmacy dosing. Recent Labs  Lab 05/29/21 0000 05/31/21 1041 06/01/21 0508  INR 2.2 2.3* 2.9*    H/O prostate cancer-monitor voiding S/P mitral valve repair for severe MR History of diastolic CHF, PS EF 60 to 60% back in 9/21 with G1 DD. Trivial tricuspid  regurgitation  Generalized weakness/debility: Obtain PT OT eval  Diet Order             Diet Heart Room service appropriate? Yes; Fluid consistency: Thin; Fluid restriction: 1200 mL Fluid  Diet effective now                 Patient's Body mass index is 21.68 kg/m. Pressure Injury 05/31/21 Ankle Left;Posterior Stage 2 -  Partial thickness loss of dermis presenting as a shallow open injury with a red, pink wound bed without slough. pink, tender to touch, healing (Active) 05/31/21 2130  Location: Ankle (mid)  Location Orientation: Left;Posterior  Staging: Stage 2 -  Partial thickness loss of dermis presenting as a shallow open injury with a red, pink wound bed without slough.  Wound Description (Comments): pink, tender to touch, healing  Present on Admission: Yes (pt. B/L ankle pressure injuries being followed outpatient wound clinic)     Pressure Injury 05/31/21 Ankle Posterior;Right Stage 2 -  Partial thickness loss of dermis presenting as a shallow open injury with a red, pink wound bed without slough. (Active)  05/31/21 2130  Location: Ankle  Location Orientation: Posterior;Right  Staging: Stage 2 -  Partial thickness loss of dermis presenting as a shallow open injury with a red, pink wound bed without slough.  Wound Description (Comments):   Present on Admission: Yes   DVT prophylaxis: coumadin Code Status:   Code Status: Full Code  Family Communication: plan of care discussed with patient at bedside. Status is: Inpatient Remains inpatient appropriate because:IV treatments appropriate due to intensity of illness or inability to take PO and Inpatient level of care appropriate due  to severity of illness Dispo: The patient is from: Home              Anticipated d/c is to: Home              Patient currently is not medically stable to d/c.   Difficult to place patient No  Unresulted Labs (From admission, onward)     Start     Ordered   06/01/21 0500  CBC  Daily,   R       05/31/21 1413           Medications reviewed:  Scheduled Meds:  amiodarone  100 mg Oral Daily   cycloSPORINE  1 drop Both Eyes BID   fluticasone  2 spray Each Nare Daily   gabapentin  300 mg Oral BID   rosuvastatin  5 mg Oral Daily   traZODone  200 mg Oral QHS   warfarin  2.5 mg Oral Once per day on Sun Mon Tue Wed Thu Sat   warfarin  5 mg Oral Q Fri   Warfarin - Pharmacist Dosing Inpatient   Does not apply q1600   Continuous Infusions:  cefTRIAXone (ROCEPHIN)  IV 1 g (06/01/21 1003)   lactated ringers     Consultants:see note  Procedures:see note Antimicrobials: Anti-infectives (From admission, onward)    Start     Dose/Rate Route Frequency Ordered Stop   06/01/21 1000  cefTRIAXone (ROCEPHIN) 1 g in sodium chloride 0.9 % 100 mL IVPB        1 g 200 mL/hr over 30 Minutes Intravenous Every 24 hours 05/31/21 1659     05/31/21 1515  cefTRIAXone (ROCEPHIN) 1 g in sodium chloride 0.9 % 100 mL IVPB        1 g 200 mL/hr over 30 Minutes Intravenous  Once 05/31/21 1504 05/31/21 1618      Culture/Microbiology    Component Value Date/Time   SDES  04/01/2019 1330    BLOOD RIGHT FOREARM Performed at [facility], [address] [place], [place] [address]    SPECREQUEST  04/01/2019 1330    BOTTLES DRAWN AEROBIC AND ANAEROBIC Blood Culture adequate volume Performed at [facility], [address] [place], [place] [address]    CULT  04/01/2019 1330    NO GROWTH 5 DAYS Performed at [facility], [place] [address], [place],  [address]    REPTSTATUS 04/06/2019 FINAL 04/01/2019 1330    Other culture-see note  Objective: Vitals: Today's Vitals   05/31/21 2058 06/01/21 0217 06/01/21 0530 06/01/21 0905  BP: 113/62 94/69 140/80 115/76  Pulse: 91 89 98 95  Resp: 18 15 20 18   Temp: 99.9 F (37.7 C) 97.6 F (36.4 C) 98 F (36.7 C) 98.1 F (36.7 C)  TempSrc: Oral Oral Oral Oral  SpO2: 95% 96% 96% 94%  Weight: 81.1 kg  80.8 kg   Height: 6' 4"   (1.93 m)     PainSc:   0-No pain     Intake/Output Summary (Last 24 hours) at 06/01/2021 1014 Last data filed at 06/01/2021 0535 Gross per 24 hour  Intake 240 ml  Output 650 ml  Net -410 ml   Filed Weights   05/31/21 2058 06/01/21 0530  Weight: 81.1 kg 80.8 kg   Weight change:   Intake/Output from previous day: 07/15 0701 - 07/16 0700 In: 240 [P.O.:240] Out: 650 [Urine:650] Intake/Output this shift: No intake/output data recorded. Filed Weights   05/31/21 2058 06/01/21 0530  Weight: 81.1 kg 80.8 kg   Examination: General exam: AAOx3, weak, HEENT:Oral mucosa moist, Ear/Nose WNL grossly,dentition normal. Respiratory system: bilaterally clear breath sounds no use of accessory muscle, non tender. Cardiovascular system: S1 & S2 +, no murmur no JVD. Gastrointestinal system: Abdomen soft, NT,ND, BS+. Nervous System:Alert, awake, moving extremities Extremities: Chronic appearing hyperpigmentation of lower extremities, distal peripheral pulses palpable.  Skin: No rashes,no icterus. MSK: Normal muscle bulk,tone, power Data Reviewed: I have personally reviewed following labs and imaging studies CBC: Recent Labs  Lab 05/31/21 1041 06/01/21 0508  WBC 22.6* 19.6*  NEUTROABS 17.2*  --   HGB 13.3 13.1  HCT 42.0 43.4  MCV 89.9 90.6  PLT 62* 54*   Basic Metabolic Panel: Recent Labs  Lab 05/31/21 1041 06/01/21 0508  NA 142 141  K 4.7 4.3  CL 104 101  CO2 28 28  GLUCOSE 161* 138*  BUN 34* 42*  CREATININE 1.59* 1.57*  CALCIUM 9.3 9.1   GFR: Estimated Creatinine Clearance: 37.2 mL/min (A) (by C-G formula based on SCr of 1.57 mg/dL (H)). Liver Function Tests: Recent Labs  Lab 05/31/21 1041 06/01/21 0508  AST 42* 44*  ALT 16 21  ALKPHOS 37* 39  BILITOT 1.6* 0.8  PROT 7.2 6.9  ALBUMIN 4.0 3.6   Recent Labs  Lab 05/31/21 1041  LIPASE 28   No results for input(s): AMMONIA in the last 168 hours. Coagulation Profile: Recent Labs  Lab 05/29/21 0000 05/31/21 1041  06/01/21 0508  INR 2.2 2.3* 2.9*   Cardiac Enzymes: No results for input(s): CKTOTAL, CKMB, CKMBINDEX, TROPONINI in the last 168 hours. BNP (last 3 results) No results for input(s): PROBNP in the last 8760 hours. HbA1C: No results for input(s): HGBA1C in the last 72 hours. CBG: No results for input(s): GLUCAP in the last 168 hours. Lipid Profile: No results for input(s): CHOL, HDL, LDLCALC, TRIG, CHOLHDL, LDLDIRECT in the last 72 hours. Thyroid Function Tests: No results for input(s): TSH, T4TOTAL, FREET4, T3FREE, THYROIDAB in the last 72 hours. Anemia Panel: No results for input(s): VITAMINB12, FOLATE, FERRITIN, TIBC, IRON, RETICCTPCT in the last 72 hours. Sepsis Labs: Recent Labs  Lab 05/31/21 1041 05/31/21 1204 05/31/21 1930 05/31/21 2246  LATICACIDVEN 2.2* 2.8* 3.3* 1.6    Recent Results (from the past 240 hour(s))  Resp Panel by RT-PCR (Flu A&B, Covid) Nasopharyngeal Swab     Status: None   Collection Time: 05/31/21 10:47 AM   Specimen: Nasopharyngeal Swab; Nasopharyngeal(NP) swabs in vial transport medium  Result Value Ref Range Status   SARS Coronavirus 2 by RT PCR NEGATIVE NEGATIVE Final    Comment: (NOTE) SARS-CoV-2 target nucleic acids are NOT DETECTED.  The SARS-CoV-2 RNA is generally detectable in upper respiratory specimens during the acute phase of infection. The lowest concentration of SARS-CoV-2 viral copies this assay can detect is 138 copies/mL. A negative result does not preclude SARS-Cov-2 infection and should not be used as the sole basis for treatment or other patient management decisions. A negative result may occur with  improper specimen collection/handling, submission of specimen other than nasopharyngeal swab, presence of viral mutation(s) within the areas targeted by this assay, and inadequate number of viral copies(<138 copies/mL). A negative result must be combined with clinical observations, patient history, and  epidemiological information. The expected result is Negative.  Fact Sheet for Patients:  EntrepreneurPulse.com.au  Fact Sheet for Healthcare Providers:  IncredibleEmployment.be  This test is no t yet approved or cleared by the [place] FDA and  has  been authorized for detection and/or diagnosis of SARS-CoV-2 by FDA under an Emergency Use Authorization (EUA). This EUA will remain  in effect (meaning this test can be used) for the duration of the COVID-19 declaration under Section 564(b)(1) of the Act, 21 U.S.C.section 360bbb-3(b)(1), unless the authorization is terminated  or revoked sooner.       Influenza A by PCR NEGATIVE NEGATIVE Final   Influenza B by PCR NEGATIVE NEGATIVE Final    Comment: (NOTE) The Xpert Xpress SARS-CoV-2/FLU/RSV plus assay is intended as an aid in the diagnosis of influenza from Nasopharyngeal swab specimens and should not be used as a sole basis for treatment. Nasal washings and aspirates are unacceptable for Xpert Xpress SARS-CoV-2/FLU/RSV testing.  Fact Sheet for Patients: EntrepreneurPulse.com.au  Fact Sheet for Healthcare Providers: IncredibleEmployment.be  This test is not yet approved or cleared by the [place] FDA and has been authorized for detection and/or diagnosis of SARS-CoV-2 by FDA under an Emergency Use Authorization (EUA). This EUA will remain in effect (meaning this test can be used) for the duration of the COVID-19 declaration under Section 564(b)(1) of the Act, 21 U.S.C. section 360bbb-3(b)(1), unless the authorization is terminated or revoked.  Performed at [facility], [place] [address] [place], [place] [address]      Radiology Studies: [facility] Chest Port 1 View  Result Date: 05/31/2021 CLINICAL DATA:  87 year old male with weakness, atrial fibrillation. EXAM: PORTABLE CHEST 1 VIEW COMPARISON:  Chest radiograph 05/11/2019 and  earlier. FINDINGS: Portable AP semi upright view at 1006 hours. Stable cardiomegaly and mediastinal contours. Previous sternotomy and valve replacement. Small left pleural effusion versus chronic pleural scarring, not significantly changed from 2020. No pneumothorax or pulmonary edema. Right lung appears clear. Visualized tracheal air column is within normal limits. No acute osseous abnormality identified. IMPRESSION: Cardiomegaly with small left pleural versus chronic pleural scarring. Electronically Signed   By: Genevie Ann M.D.   On: 05/31/2021 10:49     LOS: 1 day   Antonieta Pert, MD [facility]  06/01/2021, 10:14 AM

## 2021-06-01 NOTE — Plan of Care (Signed)
Pt lactic acid 2.8 on admission- 540ml bolus & IV abx in ED-current lactic down to 1.6.  Pt A/O x4 2L/[place] Daughter arrived with pt from the ED. # in chart. Condom cath replaced, full bath & peri care given (BM upon arrival to unit) MASD/blanchable redness to buttock. Sacral foam applied after skin cleansed. No c/o pain unless turning Denies n/v & chest pain. O/S   PT/OT- have not seen pt OOB.  Tele verified. Pt educated on plan of care & oriented to room. All questions & concerns addressed.

## 2021-06-01 NOTE — Progress Notes (Addendum)
[place] [facility] [place] for warfarin Indication: atrial fibrillation  No Known Allergies  Patient Measurements: Height: 6\' 4"  (193 cm) Weight: 80.8 kg (178 lb 2.1 oz) IBW/kg (Calculated) : 86.8   Vital Signs: Temp: 98.1 F (36.7 C) (07/16 0905) Temp Source: Oral (07/16 0905) BP: 115/76 (07/16 0905) Pulse Rate: 95 (07/16 0905)  Labs: Recent Labs    05/31/21 1041 06/01/21 0508  HGB 13.3 13.1  HCT 42.0 43.4  PLT 62* 54*  LABPROT 24.9* 30.0*  INR 2.3* 2.9*  CREATININE 1.59* 1.57*     Estimated Creatinine Clearance: 37.2 mL/min (A) (by C-G formula based on SCr of 1.57 mg/dL (H)).   Medical History: Past Medical History:  Diagnosis Date   Abnormality of gait 05/27/2016   Adenomatous polyps    Carpal tunnel syndrome 06/25/2016   Right   Depressive disorder, not elsewhere classified    First degree AV block    Holter 3/18: NSR, PACs, PVCs, no AFib, no pauses.   Hereditary and idiopathic peripheral neuropathy 06/25/2016   Hypertension    Internal nasal lesion 05/15/2013   Melanoma ([place])    Left Shoulder   Mitral regurgitation    MVP (mitral valve prolapse)    a. With severe MR s/p Complex valvuloplasty including artificial Gore-tex neochord placement x4, chordal transposition x1, chordal release x1, # 32 mm Sorin Memo 3D Ring Annuloplasty 2012. // b. Echo 2/18: mild LVH, EF 50-55, mild AI, MV repair with mild MR, mod LAE, mod RVE, severe RAE, severe TR   Neuropathy    Normal coronary arteries    a. Normal coronary anatomy by cath 2012.   Osteoarthritis    Knees   PAF (paroxysmal atrial fibrillation) ([place])    a. Post-op MVR 2012.   Personal history of colonic polyps    Prostate cancer ([place])    Pulmonary HTN ([place])    a. Mild-mod by cath 2012.   Pure hypercholesterolemia    PVC (premature ventricular contraction)    Thrombocytopenia ([facility])    Vision abnormalities    Cornea scarring    Medications:  Scheduled:   amiodarone  100 mg Oral  Daily   cycloSPORINE  1 drop Both Eyes BID   fluticasone  2 spray Each Nare Daily   gabapentin  300 mg Oral BID   rosuvastatin  5 mg Oral Daily   traZODone  200 mg Oral QHS   warfarin  2.5 mg Oral Once per day on Sun Mon Tue Wed Thu Sat   warfarin  5 mg Oral Q Fri   Warfarin - Pharmacist Dosing Inpatient   Does not apply q1600    Assessment: 87 yo male presenting with weakness.  Patient is on warfarin PTA for atrial fibrillation - per patient and ambulatory care flowsheet, regimen is warfarin 2.5mg  daily except for 5mg  on Fridays (has been on this regimen ~3 weeks).  Patient has been getting INR checked approximately every 2 weeks with stable INR.  Last dose warfarin was 7/14.  INR therapeutic at 2.3.  Hgb stable at 13.3, platelets are low at 68 however this appears to be chronic per chart review.  Patient did report dark urine x 2 days however no hematuria reported.   06/01/21 11:10 AM  INR increased significantly to 2.9 No bleeding reported  No significant drug interactions (on amiodarone PTA)  Goal of Therapy:  INR 2-3 Monitor platelets by anticoagulation protocol: Yes   Plan:  Continue home warfarin regimen of warfarin 2.5mg  daily except for  5mg  on Fridays Monitor daily INR for now Monitor daily CBC, s/sx bleeding  Napoleon Form  06/01/2021 11:09 AM

## 2021-06-01 NOTE — Consult Note (Signed)
[place] Nurse Consult Note: Reason for Consult: Consulted by Bedside Nursing for assistance with POC for integumentary system.  The orders for Stage 2 pressure injury are part of the skin care order set, however, I have assisted them today. Nurse will measure and document measurements for the bilateral heel Stage 2 pressure injuries on the Nursing Flow Sheet. Wound type: Pressure Pressure Injury POA: Yes Measurement: Stage 2 pressure injuries ot bilateral heels.  Bedside RN to measure today and document. Erythema (blanching) to buttock Chronic venous stasis (will be addressed with elevation and placement of LEs into Prevalon Boots this admission Pink, moist Drainage (amount, consistency, odor) scant serous Periwound: intact, dry Dressing procedure/placement/frequency: A mattress replacement with low air loss feature and topical care for the buttock MASD is provided. Prevalon boots for the heels as well as topical care guidance for the pressure injuries are provided.  Turning and repositioning is in place.  [place] nursing team will not follow, but will remain available to this patient, the nursing and medical teams.  Please re-consult if needed. Thanks, Maudie Flakes, MSN, RN, [place], Arther Abbott  Pager# [phone]

## 2021-06-02 LAB — CBC
HCT: 37.9 % — ABNORMAL LOW (ref 39.0–52.0)
Hemoglobin: 11.8 g/dL — ABNORMAL LOW (ref 13.0–17.0)
MCH: 28 pg (ref 26.0–34.0)
MCHC: 31.1 g/dL (ref 30.0–36.0)
MCV: 90 fL (ref 80.0–100.0)
Platelets: 49 10*3/uL — ABNORMAL LOW (ref 150–400)
RBC: 4.21 MIL/uL — ABNORMAL LOW (ref 4.22–5.81)
RDW: 16.2 % — ABNORMAL HIGH (ref 11.5–15.5)
WBC: 11.9 10*3/uL — ABNORMAL HIGH (ref 4.0–10.5)
nRBC: 0 % (ref 0.0–0.2)

## 2021-06-02 LAB — BASIC METABOLIC PANEL
Anion gap: 9 (ref 5–15)
BUN: 40 mg/dL — ABNORMAL HIGH (ref 8–23)
CO2: 27 mmol/L (ref 22–32)
Calcium: 8.5 mg/dL — ABNORMAL LOW (ref 8.9–10.3)
Chloride: 103 mmol/L (ref 98–111)
Creatinine, Ser: 1.23 mg/dL (ref 0.61–1.24)
GFR, Estimated: 56 mL/min — ABNORMAL LOW (ref >60)
Glucose, Bld: 142 mg/dL — ABNORMAL HIGH (ref 70–99)
Potassium: 3.7 mmol/L (ref 3.5–5.1)
Sodium: 139 mmol/L (ref 135–145)

## 2021-06-02 LAB — PROTIME-INR
INR: 3.3 — ABNORMAL HIGH (ref 0.8–1.2)
Prothrombin Time: 33.6 seconds — ABNORMAL HIGH (ref 11.4–15.2)

## 2021-06-02 NOTE — Progress Notes (Signed)
Patient off oxygen sating 96%-RA, encouraged patient to notify staff if he is experiencing any SOB or resp. distress. Will continue to assess patient.

## 2021-06-02 NOTE — Progress Notes (Signed)
PROGRESS NOTE    [name] [name]  QMG:[id number] DOB: Oct 13, 1931 DOA: 05/31/2021 PCP: [name], MD   Chief Complaint  Patient presents with   Weakness   Brief Narrative: 87 year old male with chronic combined systolic diastolic heart failure status post mitral valve repair with severe tricuspid regurgitation with history of A. fib on Coumadin presented to the ED for weakness x3 days with dark urine, patient had difficulty to walk because of weakness, no focal deficit and was having subjective fever chills no chest pain shortness of breath. He was seen in the ED blood work showed leukocytosis mild lactic acidosis BNP 1000 chest x-ray unremarkable UA consistent with UTI blood culture was sent patient was placed on IV antibiotics for UTI/sepsis and admitted for further work-up.  Subjective: No fever overnight Leucocytosis resolving Feels the same, still weak, but no fever no cough  Assessment & Plan:  Sepsis secondary to UTI POA: Patient met sepsis currently with hypotension lactic acidosis tachycardia leukocytosis.  Remains afebrile leukocytosis resolving urine culture pending.  Continue on Rocephin.  AKI in the setting of hypotension and sepsis. On ivf will stop and encourage po as aki is better Recent Labs  Lab 05/31/21 1041 06/01/21 0508 06/02/21 0820  BUN 34* 42* 40*  CREATININE 1.59* 1.57* 1.23   A. fib -rate controlled initially tachycardic in the ED. continue his amiodarone, warfarin INR supratherapeutic, pharmacy managing. Recent Labs  Lab 05/29/21 0000 05/31/21 1041 06/01/21 0508 06/02/21 0544  INR 2.2 2.3* 2.9* 3.3*   Thrombocytopenia: worsening Likely from sepsis.Has chronic thrombocytopenia ranging from 67-90 in past few months Recent Labs  Lab 05/31/21 1041 06/01/21 0508 06/02/21 0544  PLT 62* 54* 49*    H/O prostate cancer-monitor voiding S/P mitral valve repair for severe MR History of diastolic CHF, PS EF 60 to 60% back in 9/21 with G1  DD. Trivial tricuspid regurgitation  Generalized weakness/debility: PT OT to continue to work with him advised SNF/ supervision for mobility Home health versus SNF  Diet Order             Diet Heart Room service appropriate? Yes; Fluid consistency: Thin; Fluid restriction: 1200 mL Fluid  Diet effective now                 Patient's Body mass index is 22.33 kg/m. Pressure Injury 05/31/21 Ankle Left;Posterior Stage 2 -  Partial thickness loss of dermis presenting as a shallow open injury with a red, pink wound bed without slough. pink, tender to touch, healing (Active) 05/31/21 2130  Location: Ankle (mid)  Location Orientation: Left;Posterior  Staging: Stage 2 -  Partial thickness loss of dermis presenting as a shallow open injury with a red, pink wound bed without slough.  Wound Description (Comments): pink, tender to touch, healing  Present on Admission: Yes (pt. B/L ankle pressure injuries being followed outpatient wound clinic)     Pressure Injury 05/31/21 Ankle Posterior;Right Stage 2 -  Partial thickness loss of dermis presenting as a shallow open injury with a red, pink wound bed without slough. (Active)  05/31/21 2130  Location: Ankle  Location Orientation: Posterior;Right  Staging: Stage 2 -  Partial thickness loss of dermis presenting as a shallow open injury with a red, pink wound bed without slough.  Wound Description (Comments):   Present on Admission: Yes   DVT prophylaxis: coumadin Code Status:   Code Status: Full Code  Family Communication: plan of care discussed with patient at bedside. Self updating his son/daughter. Status is: Inpatient  Remains inpatient appropriate because:IV treatments appropriate due to intensity of illness or inability to take PO and Inpatient level of care appropriate due to severity of illness Dispo: The patient is from: Home              Anticipated d/c is to:  SNF vs HH. Prefers to go home.              Patient currently is not  medically stable to d/c.   Difficult to place patient No  Unresulted Labs (From admission, onward)     Start     Ordered   06/03/21 9518  Basic metabolic panel  Daily,   R     Question:  Specimen collection method  Answer:  Lab=Lab collect   06/02/21 0755   06/02/21 0500  Protime-INR  Daily,   R      06/01/21 1115   06/01/21 1119  Urine Culture  Add-on,   AD       Question:  Indication  Answer:  Sepsis   06/01/21 1119   06/01/21 0500  CBC  Daily,   R      05/31/21 1413           Medications reviewed:  Scheduled Meds:  amiodarone  100 mg Oral Daily   cycloSPORINE  1 drop Both Eyes BID   fluticasone  2 spray Each Nare Daily   gabapentin  300 mg Oral BID   Gerhardt's butt cream   Topical TID   rosuvastatin  5 mg Oral Daily   traZODone  200 mg Oral QHS   Warfarin - Pharmacist Dosing Inpatient   Does not apply q1600   Continuous Infusions:  cefTRIAXone (ROCEPHIN)  IV 1 g (06/02/21 0936)   Consultants:see note  Procedures:see note Antimicrobials: Anti-infectives (From admission, onward)    Start     Dose/Rate Route Frequency Ordered Stop   06/01/21 1000  cefTRIAXone (ROCEPHIN) 1 g in sodium chloride 0.9 % 100 mL IVPB        1 g 200 mL/hr over 30 Minutes Intravenous Every 24 hours 05/31/21 1659     05/31/21 1515  cefTRIAXone (ROCEPHIN) 1 g in sodium chloride 0.9 % 100 mL IVPB        1 g 200 mL/hr over 30 Minutes Intravenous  Once 05/31/21 1504 05/31/21 1618      Culture/Microbiology    Component Value Date/Time   SDES  04/01/2019 1330    BLOOD RIGHT FOREARM Performed at [facility], [address], [place], [place] [address]    SPECREQUEST  04/01/2019 1330    BOTTLES DRAWN AEROBIC AND ANAEROBIC Blood Culture adequate volume Performed at [facility], [address] [place], [place] [address]    CULT  04/01/2019 1330    NO GROWTH 5 DAYS Performed at [facility], [place] [address], [place], [place] [address]    REPTSTATUS 04/06/2019 FINAL  04/01/2019 1330    Other culture-see note  Objective: Vitals: Today's Vitals   06/02/21 0003 06/02/21 0448 06/02/21 0500 06/02/21 0935  BP:  115/78  (!) 113/56  Pulse:  (!) 47  94  Resp:  20    Temp:  99.6 F (37.6 C)    TempSrc:  Oral    SpO2:  95%  96%  Weight:   83.2 kg   Height:      PainSc: 0-No pain       Intake/Output Summary (Last 24 hours) at 06/02/2021 0954 Last  data filed at 06/02/2021 0900 Gross per 24 hour  Intake 2129.31 ml  Output 1750 ml  Net 379.31 ml   Filed Weights   05/31/21 2058 06/01/21 0530 06/02/21 0500  Weight: 81.1 kg 80.8 kg 83.2 kg   Weight change: 2.142 kg  Intake/Output from previous day: 07/16 0701 - 07/17 0700 In: 2249.3 [P.O.:840; I.V.:1409.3] Out: 1750 [Urine:1750] Intake/Output this shift: Total I/O In: 120 [P.O.:120] Out: -  Filed Weights   05/31/21 2058 06/01/21 0530 06/02/21 0500  Weight: 81.1 kg 80.8 kg 83.2 kg   Examination: General exam: AAOx 3, elderly HEENT:Oral mucosa moist, Ear/Nose WNL grossly, dentition normal. Respiratory system: bilaterally diminished,no use of accessory muscle Cardiovascular system: S1 & S2 +, No JVD,. Gastrointestinal system: Abdomen soft, NT,ND, BS+ Nervous System:Alert, awake, moving extremities and grossly nonfocal Extremities: no edema, distal peripheral pulses palpable.  Skin: No rashes,no icterus. MSK: Normal muscle bulk,tone, power   Data Reviewed: I have personally reviewed following labs and imaging studies CBC: Recent Labs  Lab 05/31/21 1041 06/01/21 0508 06/02/21 0544  WBC 22.6* 19.6* 11.9*  NEUTROABS 17.2*  --   --   HGB 13.3 13.1 11.8*  HCT 42.0 43.4 37.9*  MCV 89.9 90.6 90.0  PLT 62* 54* 49*   Basic Metabolic Panel: Recent Labs  Lab 05/31/21 1041 06/01/21 0508 06/02/21 0820  NA 142 141 139  K 4.7 4.3 3.7  CL 104 101 103  CO2 28 28 27   GLUCOSE 161* 138* 142*  BUN 34* 42* 40*  CREATININE 1.59* 1.57* 1.23  CALCIUM 9.3 9.1 8.5*   GFR: Estimated Creatinine  Clearance: 48.9 mL/min (by C-G formula based on SCr of 1.23 mg/dL). Liver Function Tests: Recent Labs  Lab 05/31/21 1041 06/01/21 0508  AST 42* 44*  ALT 16 21  ALKPHOS 37* 39  BILITOT 1.6* 0.8  PROT 7.2 6.9  ALBUMIN 4.0 3.6   Recent Labs  Lab 05/31/21 1041  LIPASE 28   No results for input(s): AMMONIA in the last 168 hours. Coagulation Profile: Recent Labs  Lab 05/29/21 0000 05/31/21 1041 06/01/21 0508 06/02/21 0544  INR 2.2 2.3* 2.9* 3.3*   Cardiac Enzymes: No results for input(s): CKTOTAL, CKMB, CKMBINDEX, TROPONINI in the last 168 hours. BNP (last 3 results) No results for input(s): PROBNP in the last 8760 hours. HbA1C: No results for input(s): HGBA1C in the last 72 hours. CBG: No results for input(s): GLUCAP in the last 168 hours. Lipid Profile: No results for input(s): CHOL, HDL, LDLCALC, TRIG, CHOLHDL, LDLDIRECT in the last 72 hours. Thyroid Function Tests: No results for input(s): TSH, T4TOTAL, FREET4, T3FREE, THYROIDAB in the last 72 hours. Anemia Panel: No results for input(s): VITAMINB12, FOLATE, FERRITIN, TIBC, IRON, RETICCTPCT in the last 72 hours. Sepsis Labs: Recent Labs  Lab 05/31/21 1041 05/31/21 1204 05/31/21 1930 05/31/21 2246  LATICACIDVEN 2.2* 2.8* 3.3* 1.6    Recent Results (from the past 240 hour(s))  Resp Panel by RT-PCR (Flu A&B, Covid) Nasopharyngeal Swab     Status: None   Collection Time: 05/31/21 10:47 AM   Specimen: Nasopharyngeal Swab; Nasopharyngeal(NP) swabs in vial transport medium  Result Value Ref Range Status   SARS Coronavirus 2 by RT PCR NEGATIVE NEGATIVE Final    Comment: (NOTE) SARS-CoV-2 target nucleic acids are NOT DETECTED.  The SARS-CoV-2 RNA is generally detectable in upper respiratory specimens during the acute phase of infection. The lowest concentration of SARS-CoV-2 viral copies this assay can detect is 138 copies/mL. A negative result does not preclude SARS-Cov-2  infection and should not be used as the  sole basis for treatment or other patient management decisions. A negative result may occur with  improper specimen collection/handling, submission of specimen other than nasopharyngeal swab, presence of viral mutation(s) within the areas targeted by this assay, and inadequate number of viral copies(<138 copies/mL). A negative result must be combined with clinical observations, patient history, and epidemiological information. The expected result is Negative.  Fact Sheet for Patients:  EntrepreneurPulse.com.au  Fact Sheet for Healthcare Providers:  IncredibleEmployment.be  This test is no t yet approved or cleared by the [place] FDA and  has been authorized for detection and/or diagnosis of SARS-CoV-2 by FDA under an Emergency Use Authorization (EUA). This EUA will remain  in effect (meaning this test can be used) for the duration of the COVID-19 declaration under Section 564(b)(1) of the Act, 21 U.S.C.section 360bbb-3(b)(1), unless the authorization is terminated  or revoked sooner.       Influenza A by PCR NEGATIVE NEGATIVE Final   Influenza B by PCR NEGATIVE NEGATIVE Final    Comment: (NOTE) The Xpert Xpress SARS-CoV-2/FLU/RSV plus assay is intended as an aid in the diagnosis of influenza from Nasopharyngeal swab specimens and should not be used as a sole basis for treatment. Nasal washings and aspirates are unacceptable for Xpert Xpress SARS-CoV-2/FLU/RSV testing.  Fact Sheet for Patients: EntrepreneurPulse.com.au  Fact Sheet for Healthcare Providers: IncredibleEmployment.be  This test is not yet approved or cleared by the [place] FDA and has been authorized for detection and/or diagnosis of SARS-CoV-2 by FDA under an Emergency Use Authorization (EUA). This EUA will remain in effect (meaning this test can be used) for the duration of the COVID-19 declaration under Section 564(b)(1) of the  Act, 21 U.S.C. section 360bbb-3(b)(1), unless the authorization is terminated or revoked.  Performed at [facility] [place] [address], [place], [place] [address]      Radiology Studies: [facility] Chest Port 1 View  Result Date: 05/31/2021 CLINICAL DATA:  87 year old male with weakness, atrial fibrillation. EXAM: PORTABLE CHEST 1 VIEW COMPARISON:  Chest radiograph 05/11/2019 and earlier. FINDINGS: Portable AP semi upright view at 1006 hours. Stable cardiomegaly and mediastinal contours. Previous sternotomy and valve replacement. Small left pleural effusion versus chronic pleural scarring, not significantly changed from 2020. No pneumothorax or pulmonary edema. Right lung appears clear. Visualized tracheal air column is within normal limits. No acute osseous abnormality identified. IMPRESSION: Cardiomegaly with small left pleural versus chronic pleural scarring. Electronically Signed   By: Genevie Ann M.D.   On: 05/31/2021 10:49     LOS: 2 days   Antonieta Pert, MD [facility]  06/02/2021, 9:54 AM

## 2021-06-02 NOTE — Progress Notes (Signed)
Nutrition Brief Note  Patient identified on the Malnutrition Screening Tool (MST) Report  Pt reported "unsure" to weight loss on admission. Reviewed hx and weight has been stable >1 year. Recent gain, but could be related to fluid.  Wt Readings from Last 25 Encounters:  06/02/21 83.2 kg  05/08/21 78.5 kg  03/28/21 78.5 kg  03/22/21 78.5 kg  03/19/21 78.5 kg  03/15/21 78.5 kg  03/08/21 78 kg  02/14/21 78.5 kg  01/31/21 78.5 kg  01/10/21 78.5 kg  12/25/20 78.5 kg  08/14/20 77.6 kg  07/19/20 77.7 kg  01/04/20 79.7 kg  12/07/19 78 kg  11/16/19 78 kg  09/29/19 76.3 kg  08/08/19 76.7 kg  06/29/19 77.1 kg  06/23/19 76.8 kg  06/22/19 73.9 kg  06/02/19 75.3 kg  04/03/19 80.8 kg  03/15/19 78.5 kg  03/11/19 78.5 kg    Body mass index is 22.33 kg/m. Patient meets criteria for normal based on current BMI.   Current diet order is heart healthy, patient is consuming approximately 98% of meals at this time. Labs and medications reviewed.   No nutrition interventions warranted at this time. If nutrition issues arise, please consult RD.   Ranell Patrick, RD, LDN Clinical Dietitian Pager on [place]

## 2021-06-02 NOTE — Progress Notes (Signed)
[place] for warfarin Indication: atrial fibrillation  No Known Allergies  Patient Measurements: Height: 6\' 4"  (193 cm) Weight: 83.2 kg (183 lb 6.8 oz) IBW/kg (Calculated) : 86.8   Vital Signs: Temp: 99.6 F (37.6 C) (07/17 0448) Temp Source: Oral (07/17 0448) BP: 115/78 (07/17 0448) Pulse Rate: 47 (07/17 0448)  Labs: Recent Labs    05/31/21 1041 06/01/21 0508 06/02/21 0544 06/02/21 0820  HGB 13.3 13.1 11.8*  --   HCT 42.0 43.4 37.9*  --   PLT 62* 54* 49*  --   LABPROT 24.9* 30.0* 33.6*  --   INR 2.3* 2.9* 3.3*  --   CREATININE 1.59* 1.57*  --  1.23     Estimated Creatinine Clearance: 48.9 mL/min (by C-G formula based on SCr of 1.23 mg/dL).   Medical History: Past Medical History:  Diagnosis Date   Abnormality of gait 05/27/2016   Adenomatous polyps    Carpal tunnel syndrome 06/25/2016   Right   Depressive disorder, not elsewhere classified    First degree AV block    Holter 3/18: NSR, PACs, PVCs, no AFib, no pauses.   Hereditary and idiopathic peripheral neuropathy 06/25/2016   Hypertension    Internal nasal lesion 05/15/2013   Melanoma ([place])    Left Shoulder   Mitral regurgitation    MVP (mitral valve prolapse)    a. With severe MR s/p Complex valvuloplasty including artificial Gore-tex neochord placement x4, chordal transposition x1, chordal release x1, # 32 mm Sorin Memo 3D Ring Annuloplasty 2012. // b. Echo 2/18: mild LVH, EF 50-55, mild AI, MV repair with mild MR, mod LAE, mod RVE, severe RAE, severe TR   Neuropathy    Normal coronary arteries    a. Normal coronary anatomy by cath 2012.   Osteoarthritis    Knees   PAF (paroxysmal atrial fibrillation) ([place])    a. Post-op MVR 2012.   Personal history of colonic polyps    Prostate cancer ([place])    Pulmonary HTN ([place])    a. Mild-mod by cath 2012.   Pure hypercholesterolemia    PVC (premature ventricular contraction)    Thrombocytopenia ([facility])    Vision  abnormalities    Cornea scarring    Medications:  Scheduled:   amiodarone  100 mg Oral Daily   cycloSPORINE  1 drop Both Eyes BID   fluticasone  2 spray Each Nare Daily   gabapentin  300 mg Oral BID   Gerhardt's butt cream   Topical TID   rosuvastatin  5 mg Oral Daily   traZODone  200 mg Oral QHS   Warfarin - Pharmacist Dosing Inpatient   Does not apply q1600    Assessment: 87 yo male presenting with weakness.  Patient is on warfarin PTA for atrial fibrillation - per patient and ambulatory care flowsheet, regimen is warfarin 2.5mg  daily except for 5mg  on Fridays (has been on this regimen ~3 weeks).  Patient has been getting INR checked approximately every 2 weeks with stable INR.  Last dose warfarin was 7/14.  INR therapeutic at 2.3.  Hgb stable at 13.3, platelets are low at 68 however this appears to be chronic per chart review.  Patient did report dark urine x 2 days however no hematuria reported.   06/02/21 9:26 AM  INR now supra-therapeutic at 3.3 No bleeding reported  No significant drug interactions (on amiodarone PTA) Eating 90-100% of meals  Goal of Therapy:  INR 2-3 Monitor platelets by anticoagulation protocol: Yes  Plan:  Hold warfarin dose today Monitor daily INR for now Monitor daily CBC, s/sx bleeding  Napoleon Form  06/02/2021 9:26 AM

## 2021-06-02 NOTE — Plan of Care (Signed)
  Problem: Clinical Measurements: Goal: Respiratory complications will improve Outcome: Progressing   Problem: Activity: Goal: Risk for activity intolerance will decrease Outcome: Progressing   Problem: Coping: Goal: Level of anxiety will decrease Outcome: Progressing   Problem: Skin Integrity: Goal: Risk for impaired skin integrity will decrease Outcome: Progressing

## 2021-06-02 NOTE — TOC Initial Note (Signed)
Transition of Care [facility]) - Initial/Assessment Note    Patient Details  Name: [name] [name] MRN: [id number] Date of Birth: June 23, 1931  Transition of Care [facility]) CM/SW Contact:    Dessa Phi, RN Phone Number: 06/02/2021, 5:03 PM  Clinical Narrative: Permission to fax out-spoke to patient, & dtr Susan-await bed offers.Will need auth, & covid within 48hrs of d/c.                  Expected Discharge Plan: Skilled Nursing Facility Barriers to Discharge: Continued Medical Work up   Patient Goals and CMS Choice Patient states their goals for this hospitalization and ongoing recovery are:: go to rehab CMS Medicare.gov Compare Post Acute Care list provided to:: Patient Choice offered to / list presented to : Patient, Adult Children  Expected Discharge Plan and Services Expected Discharge Plan: [place]   Discharge Planning Services: CM Consult Post Acute Care Choice: [place] Living arrangements for the past 2 months: Single Family Home                                      Prior Living Arrangements/Services Living arrangements for the past 2 months: Single Family Home Lives with:: Self Patient language and need for interpreter reviewed:: Yes Do you feel safe going back to the place where you live?: Yes      Need for Family Participation in Patient Care: No (Comment) Care giver support system in place?: Yes (comment) Current home services: DME (rw;caregivers-am/pm) Criminal Activity/Legal Involvement Pertinent to Current Situation/Hospitalization: No - Comment as needed  Activities of Daily Living Home Assistive Devices/Equipment: Eyeglasses, Environmental consultant (specify type), Hearing aid, Wheelchair, Shower chair with back (bilateral hearing aides) ADL Screening (condition at time of admission) Patient's cognitive ability adequate to safely complete daily activities?: Yes Is the patient deaf or have difficulty hearing?: Yes Does the patient have  difficulty seeing, even when wearing glasses/contacts?: No Does the patient have difficulty concentrating, remembering, or making decisions?: Yes (some memory problems) Patient able to express need for assistance with ADLs?: Yes Does the patient have difficulty dressing or bathing?: Yes Independently performs ADLs?: No Communication: Independent Dressing (OT): Needs assistance Is this a change from baseline?: Pre-admission baseline Grooming: Independent Feeding: Independent Bathing: Needs assistance Is this a change from baseline?: Pre-admission baseline Toileting: Needs assistance Is this a change from baseline?: Pre-admission baseline In/Out Bed: Needs assistance Is this a change from baseline?: Pre-admission baseline Walks in Home: Needs assistance Is this a change from baseline?: Pre-admission baseline Does the patient have difficulty walking or climbing stairs?: Yes Weakness of Legs: Both Weakness of Arms/Hands: Both  Permission Sought/Granted Permission sought to share information with : Case Manager Permission granted to share information with : Yes, Verbal Permission Granted              Emotional Assessment Appearance:: Appears stated age Attitude/Demeanor/Rapport: Gracious Affect (typically observed): Accepting Orientation: : Oriented to Self, Oriented to Place, Oriented to  Time, Oriented to Situation Alcohol / Substance Use: Not Applicable    Admission diagnosis:  Lower urinary tract infectious disease [N39.0] Hypoxia [R09.02] SIRS (systemic inflammatory response syndrome) (HCC) [R65.10] AKI (acute kidney injury) ([place]) [N17.9] Sepsis ([place]) [A41.9] Patient Active Problem List   Diagnosis Date Noted   Pressure injury of skin 06/01/2021   Sepsis ([place]) 05/31/2021   AKI (acute kidney injury) ([place]) 05/31/2021   Popliteal cyst, right 03/19/2021  Bimalleolar fracture of right ankle 01/10/2021   Pain in right foot 12/25/2020   Pain in right ankle and joints of  right foot 12/25/2020   Hyperglycemia 08/14/2020   High serum vitamin B12 08/14/2020   Anemia 03/11/2020   Spinal stenosis of lumbar region with neurogenic claudication 01/11/2020   Breast mass in male 09/29/2019   Hypoxia 04/01/2019   Knee pain, bilateral 08/08/2018   Mass of right hand 04/12/2018   Chronic renal insufficiency 10/06/2017   Spondylosis without myelopathy or radiculopathy, lumbar region 09/16/2017   Hypokalemia 04/30/2017   PAH (pulmonary artery hypertension) ([place])    Right-sided heart failure ([place]) 04/26/2017   Chronic diastolic heart failure ([place]) 04/21/2017   Pulmonary hypertension, primary ([place]) 04/21/2017   Severe tricuspid regurgitation 03/12/2017   Bilateral lower extremity edema 02/25/2017   Anticoagulated 02/25/2017   Varicose veins of right lower extremity with complications 50/07/3817   Obstructive lung disease (generalized) ([place]) 09/26/2016   Carpal tunnel syndrome 06/25/2016   Hereditary and idiopathic peripheral neuropathy 06/25/2016   Paresthesia 05/27/2016   Abnormality of gait 05/27/2016   Constipation 06/13/2015   Encounter for therapeutic drug monitoring 05/25/2014   Syncope 04/03/2014   Lower urinary tract infectious disease 04/03/2014   Low back pain 04/19/2013   Melanoma ([place]) 09/30/2012   Cough 03/26/2012   Hx of mitral valve repair 11/19/2011   Long term (current) use of anticoagulants 11/03/2011   Pleural effusion due to congestive heart failure ([place]) 10/28/2011   Atrial fibrillation ([place]) 10/07/2011   S/P mitral valve repair 10/01/2011   Valvular heart disease 08/21/2011   Hearing loss 04/09/2011   Thrombocytopenia ([place]) 09/19/2010   ADENOCARCINOMA, PROSTATE 09/17/2010   CALLUS, LEFT FOOT 09/17/2010   Backache 09/17/2010   TINEA PEDIS 05/28/2009   DERMATITIS, ATOPIC 04/10/2009   HYPERCHOLESTEROLEMIA 06/09/2008   DEPRESSION 06/09/2008   HYPERTENSION, BENIGN ESSENTIAL 06/09/2008   GERD 06/09/2008   OSTEOARTHRITIS, GENERALIZED,  MULTIPLE JOINTS 06/09/2008   MUSCLE SPASM, BACK 06/09/2008   H/O prostate cancer 06/09/2008   PERSONAL HISTORY OF MALIGNANT MELANOMA OF SKIN 06/09/2008   ARRHYTHMIA, HX OF 06/09/2008   Personal history of colonic polyps 06/09/2008   PCP:  [name], MD Pharmacy:   [facility] [place], [address] [place] [address] Phone: [phone] Fax: [phone]  [facility]  ([place]) - [place], [place] - [address] [place] [address] Phone: [phone] Fax: [phone]     Social Determinants of Health (SDOH) Interventions    Readmission Risk Interventions Readmission Risk Prevention Plan 04/06/2019  Transportation Screening Complete  PCP or Specialist Appt within 3-5 Days Not Complete  Not Complete comments not yet ready for d/c  HRI or [place] Complete  Social Work Consult for [place] Planning/Counseling Not Complete  SW consult not completed comments no needs identified  Palliative Care Screening Not Applicable  Medication Review Press photographer) Complete  Some recent data might be hidden

## 2021-06-02 NOTE — NC FL2 (Signed)
[place] LEVEL OF CARE SCREENING TOOL     IDENTIFICATION  Patient Name: [name] [name] Birthdate: 07-30-1931 Sex: male Admission Date (Current Location): 05/31/2021  [facility] and [place] Number:  Herbalist and Address:  [facility],  [place], [place]      Provider Number: [id number]  Attending Physician Name and Address:  Antonieta Pert, MD  Relative Name and Phone Number:  Anna Genre dtr [phone]    Current Level of Care: Hospital Recommended Level of Care: [place] Prior Approval Number:    Date Approved/Denied:   PASRR Number: [id number]  Discharge Plan: SNF    Current Diagnoses: Patient Active Problem List   Diagnosis Date Noted   Pressure injury of skin 06/01/2021   Sepsis ([place]) 05/31/2021   AKI (acute kidney injury) ([place]) 05/31/2021   Popliteal cyst, right 03/19/2021   Bimalleolar fracture of right ankle 01/10/2021   Pain in right foot 12/25/2020   Pain in right ankle and joints of right foot 12/25/2020   Hyperglycemia 08/14/2020   High serum vitamin B12 08/14/2020   Anemia 03/11/2020   Spinal stenosis of lumbar region with neurogenic claudication 01/11/2020   Breast mass in male 09/29/2019   Hypoxia 04/01/2019   Knee pain, bilateral 08/08/2018   Mass of right hand 04/12/2018   Chronic renal insufficiency 10/06/2017   Spondylosis without myelopathy or radiculopathy, lumbar region 09/16/2017   Hypokalemia 04/30/2017   PAH (pulmonary artery hypertension) ([place])    Right-sided heart failure ([place]) 04/26/2017   Chronic diastolic heart failure ([place]) 04/21/2017   Pulmonary hypertension, primary ([place]) 04/21/2017   Severe tricuspid regurgitation 03/12/2017   Bilateral lower extremity edema 02/25/2017   Anticoagulated 02/25/2017   Varicose veins of right lower extremity with complications 96/78/9381   Obstructive lung disease (generalized) ([place]) 09/26/2016   Carpal tunnel syndrome  06/25/2016   Hereditary and idiopathic peripheral neuropathy 06/25/2016   Paresthesia 05/27/2016   Abnormality of gait 05/27/2016   Constipation 06/13/2015   Encounter for therapeutic drug monitoring 05/25/2014   Syncope 04/03/2014   Lower urinary tract infectious disease 04/03/2014   Low back pain 04/19/2013   Melanoma ([place]) 09/30/2012   Cough 03/26/2012   Hx of mitral valve repair 11/19/2011   Long term (current) use of anticoagulants 11/03/2011   Pleural effusion due to congestive heart failure ([place]) 10/28/2011   Atrial fibrillation ([place]) 10/07/2011   S/P mitral valve repair 10/01/2011   Valvular heart disease 08/21/2011   Hearing loss 04/09/2011   Thrombocytopenia ([place]) 09/19/2010   ADENOCARCINOMA, PROSTATE 09/17/2010   CALLUS, LEFT FOOT 09/17/2010   Backache 09/17/2010   TINEA PEDIS 05/28/2009   DERMATITIS, ATOPIC 04/10/2009   HYPERCHOLESTEROLEMIA 06/09/2008   DEPRESSION 06/09/2008   HYPERTENSION, BENIGN ESSENTIAL 06/09/2008   GERD 06/09/2008   OSTEOARTHRITIS, GENERALIZED, MULTIPLE JOINTS 06/09/2008   MUSCLE SPASM, BACK 06/09/2008   H/O prostate cancer 06/09/2008   PERSONAL HISTORY OF MALIGNANT MELANOMA OF SKIN 06/09/2008   ARRHYTHMIA, HX OF 06/09/2008   Personal history of colonic polyps 06/09/2008    Orientation RESPIRATION BLADDER Height & Weight     Self, Time, Situation, Place  Normal Incontinent Weight: 83.2 kg Height:  6\' 4"  (193 cm)  BEHAVIORAL SYMPTOMS/MOOD NEUROLOGICAL BOWEL NUTRITION STATUS      Incontinent Diet (Heart Healthy)  AMBULATORY STATUS COMMUNICATION OF NEEDS Skin   Limited Assist Verbally PU Stage and Appropriate Care (L/R ankles-NS,pat dry,xeroform gauze,abd,kerlix,prevalon boots.)   PU Stage 2 Dressing: Daily  Personal Care Assistance Level of Assistance  Bathing, Feeding, Dressing Bathing Assistance: Limited assistance Feeding assistance: Limited assistance Dressing Assistance: Limited assistance     Functional  Limitations Info  Sight, Hearing, Speech Sight Info: Impaired (eyeglasses) Hearing Info: Impaired (Bilateral hearing aids) Speech Info: Adequate    SPECIAL CARE FACTORS FREQUENCY  PT (By licensed PT), OT (By licensed OT)     PT Frequency:  (5x week) OT Frequency:  (5x week)            Contractures Contractures Info: Not present    Additional Factors Info  Code Status, Allergies, Psychotropic Code Status Info:  (Full) Allergies Info:  (NKA) Psychotropic Info:  (Trazadone qd-see MAR)         Current Medications (06/02/2021):  This is the current hospital active medication list Current Facility-Administered Medications  Medication Dose Route Frequency Provider Last Rate Last Admin   acetaminophen (TYLENOL) tablet 650 mg  650 mg Oral Q6H PRN Rise Patience, MD   650 mg at 05/31/21 2252   Or   acetaminophen (TYLENOL) suppository 650 mg  650 mg Rectal Q6H PRN Rise Patience, MD       amiodarone (PACERONE) tablet 100 mg  100 mg Oral Daily Rise Patience, MD   100 mg at 06/02/21 0929   cefTRIAXone (ROCEPHIN) 1 g in sodium chloride 0.9 % 100 mL IVPB  1 g Intravenous Q24H Rise Patience, MD 200 mL/hr at 06/02/21 0936 1 g at 06/02/21 0936   cycloSPORINE (RESTASIS) 0.05 % ophthalmic emulsion 1 drop  1 drop Both Eyes BID Rise Patience, MD   1 drop at 06/02/21 0929   fluticasone (FLONASE) 50 MCG/ACT nasal spray 2 spray  2 spray Each Nare Daily Rise Patience, MD   2 spray at 06/02/21 0930   gabapentin (NEURONTIN) capsule 300 mg  300 mg Oral BID Rise Patience, MD   300 mg at 06/02/21 5284   Gerhardt's butt cream   Topical TID Antonieta Pert, MD   Given at 06/02/21 1616   rosuvastatin (CRESTOR) tablet 5 mg  5 mg Oral Daily Rise Patience, MD   5 mg at 06/02/21 0929   traZODone (DESYREL) tablet 200 mg  200 mg Oral QHS Rise Patience, MD   200 mg at 06/01/21 2100   Warfarin - Pharmacist Dosing Inpatient   Does not apply q1600 Rise Patience, MD   Given by Other at 05/31/21 1653     Discharge Medications: Please see discharge summary for a list of discharge medications.  Relevant Imaging Results:  Relevant Lab Results:   Additional Information SS#: [id number]  Dessa Phi, RN

## 2021-06-03 DIAGNOSIS — B9620 Unspecified Escherichia coli [E. coli] as the cause of diseases classified elsewhere: Secondary | ICD-10-CM

## 2021-06-03 LAB — URINE CULTURE: Culture: >=100000 — AB

## 2021-06-03 LAB — CBC
HCT: 36.9 % — ABNORMAL LOW (ref 39.0–52.0)
Hemoglobin: 11.5 g/dL — ABNORMAL LOW (ref 13.0–17.0)
MCH: 28 pg (ref 26.0–34.0)
MCHC: 31.2 g/dL (ref 30.0–36.0)
MCV: 90 fL (ref 80.0–100.0)
Platelets: 54 10*3/uL — ABNORMAL LOW (ref 150–400)
RBC: 4.1 MIL/uL — ABNORMAL LOW (ref 4.22–5.81)
RDW: 16.4 % — ABNORMAL HIGH (ref 11.5–15.5)
WBC: 9.1 10*3/uL (ref 4.0–10.5)
nRBC: 0 % (ref 0.0–0.2)

## 2021-06-03 LAB — PROTIME-INR
INR: 3.5 — ABNORMAL HIGH (ref 0.8–1.2)
Prothrombin Time: 35.2 seconds — ABNORMAL HIGH (ref 11.4–15.2)

## 2021-06-03 LAB — BASIC METABOLIC PANEL
Anion gap: 7 (ref 5–15)
BUN: 37 mg/dL — ABNORMAL HIGH (ref 8–23)
CO2: 28 mmol/L (ref 22–32)
Calcium: 8.3 mg/dL — ABNORMAL LOW (ref 8.9–10.3)
Chloride: 105 mmol/L (ref 98–111)
Creatinine, Ser: 1.32 mg/dL — ABNORMAL HIGH (ref 0.61–1.24)
GFR, Estimated: 52 mL/min — ABNORMAL LOW (ref >60)
Glucose, Bld: 146 mg/dL — ABNORMAL HIGH (ref 70–99)
Potassium: 3.9 mmol/L (ref 3.5–5.1)
Sodium: 140 mmol/L (ref 135–145)

## 2021-06-03 LAB — SARS CORONAVIRUS 2 (TAT 6-24 HRS): SARS Coronavirus 2: NEGATIVE

## 2021-06-03 NOTE — Progress Notes (Signed)
Physical Therapy Treatment Patient Details Name: [name] [name] MRN: [id number] DOB: Oct 25, 1931 Today's Date: 06/03/2021    History of Present Illness 87 y.o. male  presents to the ER because of weakness going on for last 3 days with dark urine and weakness. Dx of UTI, sepsis, acute renal failure. Pt with history of chronic combined systolic diastolic heart failure status post mitral valve repair with severe tricuspid regurgitation with history of A. fib on Coumadin.    PT Comments    Progressing with mobility. Increased time to complete mobility tasks. Mobility is limited by bil knee pain but pt does his best to work through it. Continue to recommend ST [facility] at [facility].    Follow Up Recommendations  SNF     Equipment Recommendations  None recommended by PT    Recommendations for Other Services       Precautions / Restrictions Precautions Precautions: Fall Restrictions Weight Bearing Restrictions: No    Mobility  Bed Mobility Overal bed mobility: Needs Assistance Bed Mobility: Supine to Sit     Supine to sit: Mod assist     General bed mobility comments: Assist for LEs and trunk. Increased time. Cues for safety, technique. Pt c/o some lightheadedness.    Transfers Overall transfer level: Needs assistance Equipment used: Rolling walker (2 wheeled) Transfers: Sit to/from Stand Sit to Stand: Mod assist;+2 physical assistance;+2 safety/equipment;From elevated surface         General transfer comment: Highly elevated bed. Increased time. Cues for safety, techique, hand placement.  Ambulation/Gait Ambulation/Gait assistance: Min assist;+2 safety/equipment Gait Distance (Feet): 25 Feet Assistive device: Rolling walker (2 wheeled) Gait Pattern/deviations: Step-through pattern;Decreased stride length     General Gait Details: Assist to stabilize throughout distance. Cues for safety, posture, RW proximity, step length. Ambulation distance limited by bil knee  pain.   Stairs             Wheelchair Mobility    Modified Rankin (Stroke Patients Only)       Balance Overall balance assessment: Needs assistance         Standing balance support: Bilateral upper extremity supported Standing balance-Leahy Scale: Poor                              Cognition Arousal/Alertness: Awake/alert Behavior During Therapy: WFL for tasks assessed/performed Overall Cognitive Status: Within Functional Limits for tasks assessed                                        Exercises      General Comments        Pertinent Vitals/Pain Pain Assessment: Faces Faces Pain Scale: Hurts even more Pain Location: B knees with mobility Pain Descriptors / Indicators: Grimacing;Guarding;Sore Pain Intervention(s): Limited activity within patient's tolerance;Monitored during session;Repositioned    Home Living                      Prior Function            PT Goals (current goals can now be found in the care plan section) Progress towards PT goals: Progressing toward goals    Frequency    Min 3X/week      PT Plan Current plan remains appropriate    Co-evaluation              AM-PAC PT "  6 Clicks" Mobility   Outcome Measure  Help needed turning from your back to your side while in a flat bed without using bedrails?: A Lot Help needed moving from lying on your back to sitting on the side of a flat bed without using bedrails?: A Lot Help needed moving to and from a bed to a chair (including a wheelchair)?: A Lot Help needed standing up from a chair using your arms (e.g., wheelchair or bedside chair)?: A Lot Help needed to walk in hospital room?: A Lot Help needed climbing 3-5 steps with a railing? : Total 6 Click Score: 11    End of Session Equipment Utilized During Treatment: Gait belt Activity Tolerance: Patient limited by fatigue;Patient limited by pain Patient left: in chair;with call  bell/phone within reach   PT Visit Diagnosis: Difficulty in walking, not elsewhere classified (R26.2);Pain;Muscle weakness (generalized) (M62.81) Pain - Right/Left:  (bil) Pain - part of body: Knee     Time: 2263-3354 PT Time Calculation (min) (ACUTE ONLY): 13 min  Charges:  $Gait Training: 8-22 mins                        Doreatha Massed, PT Acute Rehabilitation  Office: [phone] Pager: [phone]

## 2021-06-03 NOTE — Discharge Summary (Signed)
Physician Discharge Summary  [name] [name] WLN:[id number] DOB: May 24, 1931 DOA: 05/31/2021  PCP: [name], MD  Admit date: 05/31/2021 Discharge date: 06/04/2021  Admitted From: home Disposition:  snf  Recommendations for Outpatient Follow-up:  Follow up with PCP in 1-2 weeks Please obtain BMP/CBC in one week, check INR daily and adjust coumadin Please follow up on the following pending results:  Home Health:no  Equipment/Devices: none  Discharge Condition: Stable Code Status:   Code Status: Full Code Diet recommendation:  Diet Order             Diet - low sodium heart healthy           Diet Heart Room service appropriate? 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 fib on Coumadin presented to the ED for weakness x3 days with dark urine, patient had difficulty to walk because of weakness, no focal deficit and was having subjective fever chills no chest pain shortness of breath. He was seen in the ED blood work showed leukocytosis mild lactic acidosis BNP 1000 chest x-ray unremarkable UA consistent with UTI blood culture was sent patient was placed on IV antibiotics for UTI/sepsis and admitted for further work-up. Patient was treated with IV ceftriaxone sepsis parameters resolved leukocytosis resolved.  He remains weak deconditioned seen by PT OT, advised skilled nursing facility.  Patient will be discharged on oral Keflex to complete the course Of note he is needs to be checked every day to maintain INR 2-3 may need to adjust The Coumadin Dose   Discharge Diagnoses:  Sepsis secondary to UTI POA: Patient met sepsis currently with hypotension lactic acidosis tachycardia leukocytosis.  Hemodynamically stable although blood pressure soft  this morning repeat blood pressure has improved.  Culture with E. coli sensitive to ceftriaxone Cipro.  We will discharge him on oral Keflex.  Overall he is stable   AKI in the setting of hypotension and sepsis.  Creatinine overall improved 1.2-1.3.  Encourage oral intake.    A. fib -rate controlled initially tachycardic in the ED. continues Coumadin, INR supratherapeutic pharmacy managing.  Cont amiodarone.  Additional thrombocytopenia: worsening Likely from sepsis.Has chronic thrombocytopenia ranging from 67-90 in past few months.  Monitor closely.   H/O prostate cancer-monitor voiding S/P mitral valve repair for severe MR History of diastolic CHF, PS EF 60 to 60% back in 9/21 with G1 DD-can resume torsemide at 20 mg but need to check BMP if creatinine starts to trend up please stop or cut down torsemide  Trivial tricuspid regurgitation Appears euvolemic.   Generalized weakness/debility: Due to sepsis UTI.  Continue PT OT and SNF  Pressure Ulcer  POA as below Pressure Injury 05/31/21 Ankle Left;Posterior Stage 2 -  Partial thickness loss of dermis presenting as a shallow open injury with a red, pink wound bed without slough. pink, tender to touch, healing (Active) 05/31/21 2130  Location: Ankle (mid)  Location Orientation: Left;Posterior  Staging: Stage 2 -  Partial thickness loss of dermis presenting as a shallow open injury with a red, pink wound bed without slough.  Wound Description (Comments): pink, tender to touch, healing  Present on Admission: Yes (pt. B/L ankle pressure injuries being followed outpatient wound clinic)  Pressure Injury 05/31/21 Ankle Posterior;Right Stage 2 -  Partial thickness loss of dermis presenting as a shallow open injury with a red, pink wound bed without slough. (Active)  05/31/21 2130  Location: Ankle  Location Orientation: Posterior;Right  Staging: Stage 2 -  Partial thickness loss of dermis presenting as a shallow open injury with a red, pink  wound bed without slough.  Wound Description (Comments):  Present on Admission: Yes   Consults: TOC  Subjective: Alert awake oriented, feels much better.  Ready to go to rehab today.  He reports his daughter is coming to meet and facility.  Soft Discharge Exam: Vitals:   06/03/21 2150 06/04/21 0455  BP: 113/71 106/70  Pulse: 83 89  Resp: 18 16  Temp: 97.9 F (36.6 C) 98 F (36.7 C)  SpO2: 91% 95%   General: Alert awake oriented, not in acute distress Cardiovascular: RRR, S1/S2 +, no rubs, no gallops Respiratory: CTA bilaterally, no wheezing, no rhonchi Abdominal: Nontender nondistended, bowel sounds + Extremities: no edema, no cyanosis  Discharge Instructions  Discharge Instructions     Diet - low sodium heart healthy   Complete by: As directed    Discharge instructions   Complete by: As directed    Please call call MD or return to ER for similar or worsening recurring problem that brought you to hospital or if any fever,nausea/vomiting,abdominal pain, uncontrolled pain, chest pain,  shortness of breath or any other alarming symptoms.  Please continue Coumadin and adjust her dose to maintain INR 2-3 range.  Check INR every day  Please follow-up your doctor as instructed in a week time and call the office for appointment.  Please avoid alcohol, smoking, or any other illicit substance and maintain healthy habits including taking your regular medications as prescribed.  You were cared for by a hospitalist during your hospital stay. If you have any questions about your discharge medications or the care you received while you were in the hospital after you are discharged, you can call the unit and ask to speak with the hospitalist on call if the hospitalist that took care of you is not available.  Once you are discharged, your primary care physician will handle any further medical issues. Please note that NO REFILLS for any discharge medications will be authorized once you are  discharged, as it is imperative that you return to your primary care physician (or establish a relationship with a primary care physician if you do not have one) for your aftercare needs so that they can reassess your need for medications and monitor your lab values   Discharge wound care:   Complete by: As directed    Wound care to bilateral heel pressure injuries (Stage 2):  Cleanse with NS, pat dry. Cover with size appropriate piece of xeroform gauze Kellie Simmering # 294), top with dry gauze, secure with Kerlix roll gauze and place feet into Prevalon boots per protocol   Increase activity slowly   Complete by: As directed       Allergies as of 06/04/2021   No Known Allergies      Medication List     TAKE these medications    acetaminophen 500 MG tablet Commonly known as: TYLENOL Take 1,000 mg by mouth every 6 (six) hours as needed for mild pain.   amiodarone 200 MG tablet Commonly known as: PACERONE Take 0.5 tablets (100 mg total) by mouth daily.   b complex vitamins tablet Take 1 tablet by mouth daily.   cephALEXin  500 MG capsule Commonly known as: KEFLEX Take 1 capsule (500 mg total) by mouth 4 (four) times daily for 5 days.   cycloSPORINE 0.05 % ophthalmic emulsion Commonly known as: RESTASIS Place 1 drop into both eyes 2 (two) times daily.   fluticasone 50 MCG/ACT nasal spray Commonly known as: FLONASE Place 2 sprays into both nostrils daily.   multivitamin with minerals Tabs tablet Take 1 tablet by mouth every evening.   potassium chloride SA 20 MEQ tablet Commonly known as: Klor-Con M20 Take 2 tablets (40 mEq total) by mouth daily.   torsemide 20 MG tablet Commonly known as: DEMADEX Take 1 tablet (20 mg total) by mouth daily. What changed: how much to take   traZODone 100 MG tablet Commonly known as: DESYREL Take 200 mg by mouth at bedtime.   warfarin 5 MG tablet Commonly known as: COUMADIN Take as directed. If you are unsure how to take this medication,  talk to your nurse or doctor. Original instructions: Take 0.5-1 tablets (2.5-5 mg total) by mouth See admin instructions. Taking 2.5 mg on all days except 5 mg on Friday. What changed: See the new instructions.       ASK your doctor about these medications    gabapentin 300 MG capsule Commonly known as: NEURONTIN TAKE 1 CAPSULE BY MOUTH  TWICE DAILY   rosuvastatin 5 MG tablet Commonly known as: CRESTOR TAKE 1 TABLET BY MOUTH  DAILY               Discharge Care Instructions  (From admission, onward)           Start     Ordered   06/04/21 0000  Discharge wound care:       Comments: Wound care to bilateral heel pressure injuries (Stage 2):  Cleanse with NS, pat dry. Cover with size appropriate piece of xeroform gauze Kellie Simmering # 294), top with dry gauze, secure with Kerlix roll gauze and place feet into Prevalon boots per protocol   06/04/21 0921            No Known Allergies  The results of significant diagnostics from this hospitalization (including imaging, microbiology, ancillary and laboratory) are listed below for reference.    Microbiology: Recent Results (from the past 240 hour(s))  Resp Panel by RT-PCR (Flu A&B, Covid) Nasopharyngeal Swab     Status: None   Collection Time: 05/31/21 10:47 AM   Specimen: Nasopharyngeal Swab; Nasopharyngeal(NP) swabs in vial transport medium  Result Value Ref Range Status   SARS Coronavirus 2 by RT PCR NEGATIVE NEGATIVE Final    Comment: (NOTE) SARS-CoV-2 target nucleic acids are NOT DETECTED.  The SARS-CoV-2 RNA is generally detectable in upper respiratory specimens during the acute phase of infection. The lowest concentration of SARS-CoV-2 viral copies this assay can detect is 138 copies/mL. A negative result does not preclude SARS-Cov-2 infection and should not be used as the sole basis for treatment or other patient management decisions. A negative result may occur with  improper specimen collection/handling,  submission of specimen other than nasopharyngeal swab, presence of viral mutation(s) within the areas targeted by this assay, and inadequate number of viral copies(<138 copies/mL). A negative result must be combined with clinical observations, patient history, and epidemiological information. The expected result is Negative.  Fact Sheet for Patients:  EntrepreneurPulse.com.au  Fact Sheet for Healthcare Providers:  IncredibleEmployment.be  This test is no t yet approved or cleared by the [place] FDA and  has been authorized for detection  and/or diagnosis of SARS-CoV-2 by FDA under an Emergency Use Authorization (EUA). This EUA will remain  in effect (meaning this test can be used) for the duration of the COVID-19 declaration under Section 564(b)(1) of the Act, 21 U.S.C.section 360bbb-3(b)(1), unless the authorization is terminated  or revoked sooner.       Influenza A by PCR NEGATIVE NEGATIVE Final   Influenza B by PCR NEGATIVE NEGATIVE Final    Comment: (NOTE) The Xpert Xpress SARS-CoV-2/FLU/RSV plus assay is intended as an aid in the diagnosis of influenza from Nasopharyngeal swab specimens and should not be used as a sole basis for treatment. Nasal washings and aspirates are unacceptable for Xpert Xpress SARS-CoV-2/FLU/RSV testing.  Fact Sheet for Patients: EntrepreneurPulse.com.au  Fact Sheet for Healthcare Providers: IncredibleEmployment.be  This test is not yet approved or cleared by the [place] FDA and has been authorized for detection and/or diagnosis of SARS-CoV-2 by FDA under an Emergency Use Authorization (EUA). This EUA will remain in effect (meaning this test can be used) for the duration of the COVID-19 declaration under Section 564(b)(1) of the Act, 21 U.S.C. section 360bbb-3(b)(1), unless the authorization is terminated or revoked.  Performed at [facility], [place] [address], [place], [place] [address]   Urine Culture     Status: Abnormal   Collection Time: 06/01/21 11:19 AM   Specimen: Urine, Clean Catch  Result Value Ref Range Status   Specimen Description   Final    URINE, CLEAN CATCH Performed at [facility], [place] [address], [place],  [address]    Special Requests   Final    NONE Performed at [facility], [place] [address] [place], [place] [address]    Culture >=100,000 COLONIES/mL ESCHERICHIA COLI (A)  Final   Report Status 06/03/2021 FINAL  Final   Organism ID, Bacteria ESCHERICHIA COLI (A)  Final      Susceptibility   Escherichia coli - MIC*    AMPICILLIN >=32 RESISTANT Resistant     CEFAZOLIN <=4 SENSITIVE Sensitive     CEFEPIME <=0.12 SENSITIVE Sensitive     CEFTRIAXONE <=0.25 SENSITIVE Sensitive     CIPROFLOXACIN 0.5 SENSITIVE Sensitive     GENTAMICIN 8 INTERMEDIATE Intermediate     IMIPENEM <=0.25 SENSITIVE Sensitive     NITROFURANTOIN <=16 SENSITIVE Sensitive     TRIMETH/SULFA >=320 RESISTANT Resistant     AMPICILLIN/SULBACTAM >=32 RESISTANT Resistant     PIP/TAZO <=4 SENSITIVE Sensitive     * >=100,000 COLONIES/mL ESCHERICHIA COLI  SARS CORONAVIRUS 2 (TAT 6-24 HRS) Nasopharyngeal Nasopharyngeal Swab     Status: None   Collection Time: 06/03/21 12:05 PM   Specimen: Nasopharyngeal Swab  Result Value Ref Range Status   SARS Coronavirus 2 NEGATIVE NEGATIVE Final    Comment: (NOTE) SARS-CoV-2 target nucleic acids are NOT DETECTED.  The SARS-CoV-2 RNA is generally detectable in upper and lower respiratory specimens during the acute phase of infection. Negative results do not preclude SARS-CoV-2 infection, do not rule out co-infections with other pathogens, and should not be used as the sole basis for treatment or other patient management decisions. Negative results must be combined with clinical observations, patient history, and epidemiological information. The  expected result is Negative.  Fact Sheet for Patients: SugarRoll.be  Fact Sheet for Healthcare Providers: https://www.woods-mathews.com/  This test is not yet approved or cleared by the [place] FDA and  has been authorized for detection and/or diagnosis of SARS-CoV-2 by FDA under an Emergency Use Authorization (  EUA). This EUA will remain  in effect (meaning this test can be used) for the duration of the COVID-19 declaration under Se ction 564(b)(1) of the Act, 21 U.S.C. section 360bbb-3(b)(1), unless the authorization is terminated or revoked sooner.  Performed at [facility], [place] [address], [place], [place] [address]     Procedures/Studies: DG Chest Port 1 View  Result Date: 05/31/2021 CLINICAL DATA:  87 year old male with weakness, atrial fibrillation. EXAM: PORTABLE CHEST 1 VIEW COMPARISON:  Chest radiograph 05/11/2019 and earlier. FINDINGS: Portable AP semi upright view at 1006 hours. Stable cardiomegaly and mediastinal contours. Previous sternotomy and valve replacement. Small left pleural effusion versus chronic pleural scarring, not significantly changed from 2020. No pneumothorax or pulmonary edema. Right lung appears clear. Visualized tracheal air column is within normal limits. No acute osseous abnormality identified. IMPRESSION: Cardiomegaly with small left pleural versus chronic pleural scarring. Electronically Signed   By: Genevie Ann M.D.   On: 05/31/2021 10:49   VAS [place] LOWER EXTREMITY VENOUS REFLUX  Result Date: 05/08/2021  Lower Venous Reflux Study Patient Name:  CHEZ BULNES  Date of Exam:   05/08/2021 Medical Rec #: [id number]        Accession #:    [id number] Date of Birth: Aug 18, 1931        Patient Gender: M Patient Age:   89Y Exam Location:  [facility] Procedure:      VAS [place] LOWER EXTREMITY VENOUS REFLUX Referring Phys: [id number] Marty Heck  --------------------------------------------------------------------------------  Indications: Edema.  Limitations: Poor patient position. Comparison Study: 02/17/17 Venous reflux in right popliteal vein only Performing Technologist: June Leap RDMS, RVT  Examination Guidelines: A complete evaluation includes B-mode imaging, spectral Doppler, color Doppler, and power Doppler as needed of all accessible portions of each vessel. Bilateral testing is considered an integral part of a complete examination. Limited examinations for reoccurring indications may be performed as noted. The reflux portion of the exam is performed with the patient in reverse Trendelenburg. Significant venous reflux is defined as >500 ms in the superficial venous system, and >1 second in the deep venous system.  Venous Reflux Times +--------------+---------+------+-----------+------------+--------+ RIGHT         Reflux NoRefluxReflux TimeDiameter cmsComments                         Yes                                  +--------------+---------+------+-----------+------------+--------+ CFV           no                                             +--------------+---------+------+-----------+------------+--------+ FV mid        no                                             +--------------+---------+------+-----------+------------+--------+ Popliteal               yes                                  +--------------+---------+------+-----------+------------+--------+  GSV at [facility]    no                            0.79             +--------------+---------+------+-----------+------------+--------+ GSV prox thighno                            0.3              +--------------+---------+------+-----------+------------+--------+ GSV mid thigh no                            0.21             +--------------+---------+------+-----------+------------+--------+ GSV dist thigh                               0.23             +--------------+---------+------+-----------+------------+--------+ GSV at knee                                 0.21             +--------------+---------+------+-----------+------------+--------+ GSV prox calf                               0.13             +--------------+---------+------+-----------+------------+--------+ SSV Pop Fossa no                            0.16             +--------------+---------+------+-----------+------------+--------+ SSV prox calf no                            0.2              +--------------+---------+------+-----------+------------+--------+ SSV mid calf  no                            0.25             +--------------+---------+------+-----------+------------+--------+   Summary: Right: - No evidence of deep vein thrombosis seen in the right lower extremity, from the common femoral through the popliteal veins. - No evidence of superficial venous thrombosis in the right lower extremity. - Venous reflux is noted in the right popliteal vein.  *See table(s) above for measurements and observations. Electronically signed by Deitra Mayo MD on 05/08/2021 at 1:59:59 PM.    Final     Labs: BNP (last 3 results) Recent Labs    07/19/20 1213 03/08/21 1815 05/31/21 1041  BNP 223.7* 345.3* 0,964.3*   Basic Metabolic Panel: Recent Labs  Lab 05/31/21 1041 06/01/21 0508 06/02/21 0820 06/03/21 0401 06/04/21 0416  NA 142 141 139 140 142  K 4.7 4.3 3.7 3.9 3.5  CL 104 101 103 105 105  CO2 _0 GLUCOSE 161* 138* 142* 146* 128*  BUN 34* 42* 40* 37* 34*  CREATININE 1.59* 1.57* 1.23 1.32* 1.09  CALCIUM 9.3 9.1 8.5* 8.3* 8.4*   Liver Function Tests: Recent Labs  Lab 05/31/21 1041 06/01/21  0508  AST 42* 44*  ALT 16 21  ALKPHOS 37* 39  BILITOT 1.6* 0.8  PROT 7.2 6.9  ALBUMIN 4.0 3.6   Recent Labs  Lab 05/31/21 1041  LIPASE 28   No results for input(s): AMMONIA in the last 168  hours. CBC: Recent Labs  Lab 05/31/21 1041 06/01/21 0508 06/02/21 0544 06/03/21 0401 06/04/21 0416  WBC 22.6* 19.6* 11.9* 9.1 7.8  NEUTROABS 17.2*  --   --   --   --   HGB 13.3 13.1 11.8* 11.5* 11.3*  HCT 42.0 43.4 37.9* 36.9* 36.3*  MCV 89.9 90.6 90.0 90.0 90.5  PLT 62* 54* 49* 54* 67*   Cardiac Enzymes: No results for input(s): CKTOTAL, CKMB, CKMBINDEX, TROPONINI in the last 168 hours. BNP: Invalid input(s): POCBNP CBG: No results for input(s): GLUCAP in the last 168 hours. D-Dimer No results for input(s): DDIMER in the last 72 hours. Hgb A1c No results for input(s): HGBA1C in the last 72 hours. Lipid Profile No results for input(s): CHOL, HDL, LDLCALC, TRIG, CHOLHDL, LDLDIRECT in the last 72 hours. Thyroid function studies No results for input(s): TSH, T4TOTAL, T3FREE, THYROIDAB in the last 72 hours.  Invalid input(s): FREET3 Anemia work up No results for input(s): VITAMINB12, FOLATE, FERRITIN, TIBC, IRON, RETICCTPCT in the last 72 hours. Urinalysis    Component Value Date/Time   COLORURINE AMBER (A) 05/31/2021 1427   APPEARANCEUR CLOUDY (A) 05/31/2021 1427   LABSPEC 1.014 05/31/2021 1427   PHURINE 5.0 05/31/2021 1427   GLUCOSEU NEGATIVE 05/31/2021 1427   GLUCOSEU NEGATIVE 11/01/2018 1234   HGBUR LARGE (A) 05/31/2021 1427   BILIRUBINUR NEGATIVE 05/31/2021 1427   BILIRUBINUR negative 09/22/2017 1040   KETONESUR NEGATIVE 05/31/2021 1427   PROTEINUR >=300 (A) 05/31/2021 1427   UROBILINOGEN 0.2 11/01/2018 1234   NITRITE NEGATIVE 05/31/2021 1427   LEUKOCYTESUR LARGE (A) 05/31/2021 1427   Sepsis Labs Invalid input(s): PROCALCITONIN,  WBC,  LACTICIDVEN Microbiology Recent Results (from the past 240 hour(s))  Resp Panel by RT-PCR (Flu A&B, Covid) Nasopharyngeal Swab     Status: None   Collection Time: 05/31/21 10:47 AM   Specimen: Nasopharyngeal Swab; Nasopharyngeal(NP) swabs in vial transport medium  Result Value Ref Range Status   SARS Coronavirus 2 by RT PCR  NEGATIVE NEGATIVE Final    Comment: (NOTE) SARS-CoV-2 target nucleic acids are NOT DETECTED.  The SARS-CoV-2 RNA is generally detectable in upper respiratory specimens during the acute phase of infection. The lowest concentration of SARS-CoV-2 viral copies this assay can detect is 138 copies/mL. A negative result does not preclude SARS-Cov-2 infection and should not be used as the sole basis for treatment or other patient management decisions. A negative result may occur with  improper specimen collection/handling, submission of specimen other than nasopharyngeal swab, presence of viral mutation(s) within the areas targeted by this assay, and inadequate number of viral copies(<138 copies/mL). A negative result must be combined with clinical observations, patient history, and epidemiological information. The expected result is Negative.  Fact Sheet for Patients:  EntrepreneurPulse.com.au  Fact Sheet for Healthcare Providers:  IncredibleEmployment.be  This test is no t yet approved or cleared by the [place] FDA and  has been authorized for detection and/or diagnosis of SARS-CoV-2 by FDA under an Emergency Use Authorization (EUA). This EUA will remain  in effect (meaning this test can be used) for the duration of the COVID-19 declaration under Section 564(b)(1) of the Act, 21 U.S.C.section 360bbb-3(b)(1), unless the authorization is terminated  or revoked sooner.  Influenza A by PCR NEGATIVE NEGATIVE Final   Influenza B by PCR NEGATIVE NEGATIVE Final    Comment: (NOTE) The Xpert Xpress SARS-CoV-2/FLU/RSV plus assay is intended as an aid in the diagnosis of influenza from Nasopharyngeal swab specimens and should not be used as a sole basis for treatment. Nasal washings and aspirates are unacceptable for Xpert Xpress SARS-CoV-2/FLU/RSV testing.  Fact Sheet for Patients: EntrepreneurPulse.com.au  Fact Sheet for  Healthcare Providers: IncredibleEmployment.be  This test is not yet approved or cleared by the [place] FDA and has been authorized for detection and/or diagnosis of SARS-CoV-2 by FDA under an Emergency Use Authorization (EUA). This EUA will remain in effect (meaning this test can be used) for the duration of the COVID-19 declaration under Section 564(b)(1) of the Act, 21 U.S.C. section 360bbb-3(b)(1), unless the authorization is terminated or revoked.  Performed at [facility], [place] [address] [place], [place] [address]   Urine Culture     Status: Abnormal   Collection Time: 06/01/21 11:19 AM   Specimen: Urine, Clean Catch  Result Value Ref Range Status   Specimen Description   Final    URINE, CLEAN CATCH Performed at [facility], [place] [address], [place], [place] [address]    Special Requests   Final    NONE Performed at [facility], [place] [address], [place], [place] [address]    Culture >=100,000 COLONIES/mL ESCHERICHIA COLI (A)  Final   Report Status 06/03/2021 FINAL  Final   Organism ID, Bacteria ESCHERICHIA COLI (A)  Final      Susceptibility   Escherichia coli - MIC*    AMPICILLIN >=32 RESISTANT Resistant     CEFAZOLIN <=4 SENSITIVE Sensitive     CEFEPIME <=0.12 SENSITIVE Sensitive     CEFTRIAXONE <=0.25 SENSITIVE Sensitive     CIPROFLOXACIN 0.5 SENSITIVE Sensitive     GENTAMICIN 8 INTERMEDIATE Intermediate     IMIPENEM <=0.25 SENSITIVE Sensitive     NITROFURANTOIN <=16 SENSITIVE Sensitive     TRIMETH/SULFA >=320 RESISTANT Resistant     AMPICILLIN/SULBACTAM >=32 RESISTANT Resistant     PIP/TAZO <=4 SENSITIVE Sensitive     * >=100,000 COLONIES/mL ESCHERICHIA COLI  SARS CORONAVIRUS 2 (TAT 6-24 HRS) Nasopharyngeal Nasopharyngeal Swab     Status: None   Collection Time: 06/03/21 12:05 PM   Specimen: Nasopharyngeal Swab  Result Value Ref Range Status   SARS Coronavirus 2 NEGATIVE NEGATIVE  Final    Comment: (NOTE) SARS-CoV-2 target nucleic acids are NOT DETECTED.  The SARS-CoV-2 RNA is generally detectable in upper and lower respiratory specimens during the acute phase of infection. Negative results do not preclude SARS-CoV-2 infection, do not rule out co-infections with other pathogens, and should not be used as the sole basis for treatment or other patient management decisions. Negative results must be combined with clinical observations, patient history, and epidemiological information. The expected result is Negative.  Fact Sheet for Patients: SugarRoll.be  Fact Sheet for Healthcare Providers: https://www.woods-mathews.com/  This test is not yet approved or cleared by the [place] FDA and  has been authorized for detection and/or diagnosis of SARS-CoV-2 by FDA under an Emergency Use Authorization (EUA). This EUA will remain  in effect (meaning this test can be used) for the duration of the COVID-19 declaration under Se ction 564(b)(1) of the Act, 21 U.S.C. section 360bbb-3(b)(1), unless the authorization is terminated or revoked sooner.  Performed at [facility], [place] [address] [place],  [address]  Time coordinating discharge: 35 minutes  SIGNED: Antonieta Pert, MD  [facility] 06/04/2021, 9:23 AM  If 7PM-7AM, please contact night-coverage [email or web address]

## 2021-06-03 NOTE — Plan of Care (Signed)
  Problem: Clinical Measurements: Goal: Will remain free from infection Outcome: Progressing Goal: Diagnostic test results will improve Outcome: Progressing   Problem: Nutrition: Goal: Adequate nutrition will be maintained Outcome: Progressing   Problem: Coping: Goal: Level of anxiety will decrease Outcome: Progressing   Problem: Pain Managment: Goal: General experience of comfort will improve Outcome: Progressing   Problem: Skin Integrity: Goal: Risk for impaired skin integrity will decrease Outcome: Progressing

## 2021-06-03 NOTE — TOC Progression Note (Addendum)
Transition of Care [facility]) - Progression Note    Patient Details  Name: [name] [name] MRN: [id number] Date of Birth: 1930-11-28  Transition of Care [facility]) CM/SW Contact  Ross Ludwig, [place] Phone Number: 06/03/2021, 4:32 PM  Clinical Narrative:     CSW provided bed offers to patient's daughter.  Patient's daughter chose [facility].  CSW spoke to [facility] and they can accept patient tomorrow pending negative covid test.  CSW began insurance authorization, patient was approved for SNF placement [employer] reference number [id number] with a start date of 7/19, next review 06/06/21.  CSW updated patient's daughter, attending physician, bedside nurse, and patient's daughter.    Expected Discharge Plan: [place] Barriers to Discharge: Continued Medical Work up  Expected Discharge Plan and Services Expected Discharge Plan: [place]   Discharge Planning Services: CM Consult Post Acute Care Choice: [place] Living arrangements for the past 2 months: Single Family Home                                       Social Determinants of Health (SDOH) Interventions    Readmission Risk Interventions Readmission Risk Prevention Plan 06/02/2021 04/06/2019  Transportation Screening Complete Complete  PCP or Specialist Appt within 3-5 Days Complete Not Complete  Not Complete comments - not yet ready for d/c  HRI or [place] Complete Complete  Social Work Consult for [place] Planning/Counseling Complete Not Complete  SW consult not completed comments - no needs identified  Palliative Care Screening Not Applicable Not Applicable  Medication Review Press photographer) Complete Complete  Some recent data might be hidden

## 2021-06-03 NOTE — Progress Notes (Signed)
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 fib on Coumadin presented to the ED for weakness x3 days with dark urine, patient had difficulty to walk because of weakness, no focal deficit and was having subjective fever chills no chest pain shortness of breath. He was seen in the ED blood work showed leukocytosis mild lactic acidosis BNP 1000 chest x-ray unremarkable UA consistent with UTI blood culture was sent patient was placed on IV antibiotics for UTI/sepsis and admitted for further work-up. Patient was treated with IV ceftriaxone sepsis parameters resolved leukocytosis resolved.  He remains weak deconditioned seen by PT OT, advised skilled nursing facility.  Subjective: Doing well.  No fever overnight.  Still having weakness.  Assessment & Plan:  Sepsis secondary to UTI POA: Patient met sepsis currently with hypotension lactic acidosis tachycardia leukocytosis.  Hemodynamically stable although blood pressure soft this morning repeat blood pressure has improved.  Culture with E. coli sensitive to ceftriaxone Cipro.  Keep on IV ceftriaxone and switch to Keflex upon discharge  AKI in the setting of hypotension and sepsis.  Creatinine overall improved 1.2-1.3.  Encourage oral intake.   Recent Labs  Lab 05/31/21 1041 06/01/21 0508 06/02/21 0820 06/03/21 0401  BUN 34* 42* 40* 37*  CREATININE 1.59* 1.57* 1.23 1.32*    A. fib -rate controlled initially tachycardic in the ED. continues Coumadin, INR supratherapeutic pharmacy managing.  Cont amiodarone. Recent Labs  Lab 05/29/21 0000 05/31/21 1041 06/01/21 0508 06/02/21 0544 06/03/21 0401  INR 2.2 2.3* 2.9* 3.3* 3.5*   Thrombocytopenia:  worsening Likely from sepsis.Has chronic thrombocytopenia ranging from 67-90 in past few months.  Monitor closely. Recent Labs  Lab 05/31/21 1041 06/01/21 0508 06/02/21 0544 06/03/21 0401  PLT 62* 54* 49* 49*     H/O prostate cancer-monitor voiding S/P mitral valve repair for severe MR History of diastolic CHF, PS EF 60 to 60% back in 9/21 with G1 DD. Trivial tricuspid regurgitation Appears euvolemic.  Generalized weakness/debility: Due to sepsis UTI.  Continue PT OT.  We discussed about benefit of going to skilled nursing facility, awaiting for offers, we will send  covid swab  Diet Order             Diet Heart Room service appropriate? Yes; Fluid consistency: Thin; Fluid restriction: 1200 mL Fluid  Diet effective now                 Patient's Body mass index is 22.17 kg/m. Pressure Injury 05/31/21 Ankle Left;Posterior Stage 2 -  Partial thickness loss of dermis presenting as a shallow open injury with a red, pink wound bed without slough. pink, tender to touch, healing (Active) 05/31/21 2130  Location: Ankle (mid)  Location Orientation: Left;Posterior  Staging: Stage 2 -  Partial thickness loss of dermis presenting as a shallow open injury with a red, pink wound bed without slough.  Wound Description (Comments): pink, tender to touch, healing  Present on Admission: Yes (pt. B/L ankle pressure injuries being followed outpatient wound clinic)     Pressure Injury 05/31/21 Ankle Posterior;Right Stage 2 -  Partial thickness loss of dermis presenting as a shallow open injury with a red, pink wound bed without slough. (Active)  05/31/21 2130  Location: Ankle  Location Orientation: Posterior;Right  Staging: Stage 2 -  Partial thickness loss of dermis presenting as a shallow open injury with a red, pink wound bed without slough.  Wound Description (Comments):   Present on Admission: Yes   DVT prophylaxis: coumadin Code Status:   Code Status: Full Code  Family Communication:  plan of care discussed with patient at bedside. Self updating his son/daughter. Status is: Inpatient Remains inpatient appropriate because:IV treatments appropriate due to intensity of illness or inability to take PO and Inpatient level of care appropriate due to severity of illness Dispo: The patient is from: Home              Anticipated d/c is to:  SNF.              Patient currently is medically stable   Difficult to place patient No  Unresulted Labs (From admission, onward)     Start     Ordered   06/04/21 0500  CBC  Tomorrow morning,   R       Question:  Specimen collection method  Answer:  Lab=Lab collect   06/03/21 0846   06/03/21 1119  SARS CORONAVIRUS 2 (TAT 6-24 HRS) Nasopharyngeal Nasopharyngeal Swab  Once,   R       Question Answer Comment  Is this test for diagnosis or screening Screening   Symptomatic for COVID-19 as defined by CDC No   Hospitalized for COVID-19 No   Admitted to ICU for COVID-19 No   Previously tested for COVID-19 Yes   Resident in a congregate (group) care setting Yes   Employed in healthcare setting No   Has patient completed COVID vaccination(s) (2 doses of Pfizer/Moderna 1 dose of [employer]) Unknown      06/03/21 1118   06/03/21 1448  Basic metabolic panel  Daily,   R     Question:  Specimen collection method  Answer:  Lab=Lab collect   06/02/21 0755   06/02/21 0500  Protime-INR  Daily,   R      06/01/21 1115           Medications reviewed:  Scheduled Meds:  amiodarone  100 mg Oral Daily   cycloSPORINE  1 drop Both Eyes BID   fluticasone  2 spray Each Nare Daily   gabapentin  300 mg Oral BID   Gerhardt's butt cream   Topical TID   rosuvastatin  5 mg Oral Daily   traZODone  200 mg Oral QHS   Warfarin - Pharmacist Dosing Inpatient   Does not apply q1600   Continuous Infusions:  cefTRIAXone (ROCEPHIN)  IV 1 g (06/03/21 0948)   Consultants:see note  Procedures:see note Antimicrobials: Anti-infectives (From admission, onward)     Start     Dose/Rate Route Frequency Ordered Stop   06/01/21 1000  cefTRIAXone (ROCEPHIN) 1 g in sodium chloride 0.9 % 100 mL IVPB        1 g 200 mL/hr over 30 Minutes Intravenous Every 24 hours 05/31/21 1659     05/31/21 1515  cefTRIAXone (ROCEPHIN) 1 g in sodium chloride 0.9 % 100 mL IVPB        1 g 200 mL/hr over 30 Minutes Intravenous  Once 05/31/21 1504 05/31/21 1618      Culture/Microbiology    Component Value Date/Time   SDES  06/01/2021 1119    URINE, CLEAN CATCH Performed at [facility], [place] [address], [place], [place] [address]    SPECREQUEST  06/01/2021 1119  NONE Performed at [facility], [place] [address] [place], [place] [address]    CULT >=100,000 COLONIES/mL ESCHERICHIA COLI (A) 06/01/2021 1119   REPTSTATUS 06/03/2021 FINAL 06/01/2021 1119    Other culture-see note  Objective: Vitals: Today's Vitals   06/02/21 2344 06/03/21 0424 06/03/21 0437 06/03/21 0940  BP:  (!) 98/51  137/74  Pulse:  97  94  Resp:  20    Temp:  98.9 F (37.2 C)    TempSrc:  Oral    SpO2:  90%    Weight:   82.6 kg   Height:      PainSc: 0-No pain       Intake/Output Summary (Last 24 hours) at 06/03/2021 1118 Last data filed at 06/03/2021 0437 Gross per 24 hour  Intake 160 ml  Output 700 ml  Net -540 ml    Filed Weights   06/01/21 0530 06/02/21 0500 06/03/21 0437  Weight: 80.8 kg 83.2 kg 82.6 kg   Weight change: -0.6 kg  Intake/Output from previous day: 07/17 0701 - 07/18 0700 In: 551.1 [P.O.:180; I.V.:271.1; IV Piggyback:100] Out: 700 [Urine:700] Intake/Output this shift: No intake/output data recorded. Filed Weights   06/01/21 0530 06/02/21 0500 06/03/21 0437  Weight: 80.8 kg 83.2 kg 82.6 kg   Examination: General exam: AAOx 3, elderly, frail wea. HEENT:Oral mucosa moist, Ear/Nose WNL grossly, dentition normal. Respiratory system: bilaterally diminished, no added sounds no use of accessory muscle Cardiovascular system:  S1 & S2 +, No JVD,. Gastrointestinal system: Abdomen soft,NT,ND, BS+ Nervous System:Alert, awake, moving extremities and grossly nonfocal Extremities: No edema, distal peripheral pulses palpable.  Skin: No rashes,no icterus. MSK: Normal muscle bulk,tone, power   Data Reviewed: I have personally reviewed following labs and imaging studies CBC: Recent Labs  Lab 05/31/21 1041 06/01/21 0508 06/02/21 0544 06/03/21 0401  WBC 22.6* 19.6* 11.9* 9.1  NEUTROABS 17.2*  --   --   --   HGB 13.3 13.1 11.8* 11.5*  HCT 42.0 43.4 37.9* 36.9*  MCV 89.9 90.6 90.0 90.0  PLT 62* 54* 49* 54*    Basic Metabolic Panel: Recent Labs  Lab 05/31/21 1041 06/01/21 0508 06/02/21 0820 06/03/21 0401  NA 142 141 139 140  K 4.7 4.3 3.7 3.9  CL 104 101 103 105  CO2 _0 GLUCOSE 161* 138* 142* 146*  BUN 34* 42* 40* 37*  CREATININE 1.59* 1.57* 1.23 1.32*  CALCIUM 9.3 9.1 8.5* 8.3*    GFR: Estimated Creatinine Clearance: 45.2 mL/min (A) (by C-G formula based on SCr of 1.32 mg/dL (H)). Liver Function Tests: Recent Labs  Lab 05/31/21 1041 06/01/21 0508  AST 42* 44*  ALT 16 21  ALKPHOS 37* 39  BILITOT 1.6* 0.8  PROT 7.2 6.9  ALBUMIN 4.0 3.6    Recent Labs  Lab 05/31/21 1041  LIPASE 28    No results for input(s): AMMONIA in the last 168 hours. Coagulation Profile: Recent Labs  Lab 05/29/21 0000 05/31/21 1041 06/01/21 0508 06/02/21 0544 06/03/21 0401  INR 2.2 2.3* 2.9* 3.3* 3.5*    Cardiac Enzymes: No results for input(s): CKTOTAL, CKMB, CKMBINDEX, TROPONINI in the last 168 hours. BNP (last 3 results) No results for input(s): PROBNP in the last 8760 hours. HbA1C: No results for input(s): HGBA1C in the last 72 hours. CBG: No results for input(s): GLUCAP in the last 168 hours. Lipid Profile: No results for input(s): CHOL, HDL, LDLCALC, TRIG, CHOLHDL, LDLDIRECT in the last 72 hours. Thyroid Function Tests: No results for  input(s): TSH, T4TOTAL, FREET4, T3FREE, THYROIDAB  in the last 72 hours. Anemia Panel: No results for input(s): VITAMINB12, FOLATE, FERRITIN, TIBC, IRON, RETICCTPCT in the last 72 hours. Sepsis Labs: Recent Labs  Lab 05/31/21 1041 05/31/21 1204 05/31/21 1930 05/31/21 2246  LATICACIDVEN 2.2* 2.8* 3.3* 1.6     Recent Results (from the past 240 hour(s))  Resp Panel by RT-PCR (Flu A&B, Covid) Nasopharyngeal Swab     Status: None   Collection Time: 05/31/21 10:47 AM   Specimen: Nasopharyngeal Swab; Nasopharyngeal(NP) swabs in vial transport medium  Result Value Ref Range Status   SARS Coronavirus 2 by RT PCR NEGATIVE NEGATIVE Final    Comment: (NOTE) SARS-CoV-2 target nucleic acids are NOT DETECTED.  The SARS-CoV-2 RNA is generally detectable in upper respiratory specimens during the acute phase of infection. The lowest concentration of SARS-CoV-2 viral copies this assay can detect is 138 copies/mL. A negative result does not preclude SARS-Cov-2 infection and should not be used as the sole basis for treatment or other patient management decisions. A negative result may occur with  improper specimen collection/handling, submission of specimen other than nasopharyngeal swab, presence of viral mutation(s) within the areas targeted by this assay, and inadequate number of viral copies(<138 copies/mL). A negative result must be combined with clinical observations, patient history, and epidemiological information. The expected result is Negative.  Fact Sheet for Patients:  EntrepreneurPulse.com.au  Fact Sheet for Healthcare Providers:  IncredibleEmployment.be  This test is no t yet approved or cleared by the [place] FDA and  has been authorized for detection and/or diagnosis of SARS-CoV-2 by FDA under an Emergency Use Authorization (EUA). This EUA will remain  in effect (meaning this test can be used) for the duration of the COVID-19 declaration under Section 564(b)(1) of the Act,  21 U.S.C.section 360bbb-3(b)(1), unless the authorization is terminated  or revoked sooner.       Influenza A by PCR NEGATIVE NEGATIVE Final   Influenza B by PCR NEGATIVE NEGATIVE Final    Comment: (NOTE) The Xpert Xpress SARS-CoV-2/FLU/RSV plus assay is intended as an aid in the diagnosis of influenza from Nasopharyngeal swab specimens and should not be used as a sole basis for treatment. Nasal washings and aspirates are unacceptable for Xpert Xpress SARS-CoV-2/FLU/RSV testing.  Fact Sheet for Patients: EntrepreneurPulse.com.au  Fact Sheet for Healthcare Providers: IncredibleEmployment.be  This test is not yet approved or cleared by the [place] FDA and has been authorized for detection and/or diagnosis of SARS-CoV-2 by FDA under an Emergency Use Authorization (EUA). This EUA will remain in effect (meaning this test can be used) for the duration of the COVID-19 declaration under Section 564(b)(1) of the Act, 21 U.S.C. section 360bbb-3(b)(1), unless the authorization is terminated or revoked.  Performed at [facility], [place] [address] [place], [place] [address]   Urine Culture     Status: Abnormal   Collection Time: 06/01/21 11:19 AM   Specimen: Urine, Clean Catch  Result Value Ref Range Status   Specimen Description   Final    URINE, CLEAN CATCH Performed at [facility], [place] [address], [place], [place] [address]    Special Requests   Final    NONE Performed at [facility], [place] [address], [place], [place] [address]    Culture >=100,000 COLONIES/mL ESCHERICHIA COLI (A)  Final   Report Status 06/03/2021 FINAL  Final   Organism ID, Bacteria ESCHERICHIA COLI (A)  Final      Susceptibility  Escherichia coli - MIC*    AMPICILLIN >=32 RESISTANT Resistant     CEFAZOLIN <=4 SENSITIVE Sensitive     CEFEPIME <=0.12 SENSITIVE Sensitive     CEFTRIAXONE <=0.25 SENSITIVE  Sensitive     CIPROFLOXACIN 0.5 SENSITIVE Sensitive     GENTAMICIN 8 INTERMEDIATE Intermediate     IMIPENEM <=0.25 SENSITIVE Sensitive     NITROFURANTOIN <=16 SENSITIVE Sensitive     TRIMETH/SULFA >=320 RESISTANT Resistant     AMPICILLIN/SULBACTAM >=32 RESISTANT Resistant     PIP/TAZO <=4 SENSITIVE Sensitive     * >=100,000 COLONIES/mL ESCHERICHIA COLI      Radiology Studies: No results found.   LOS: 3 days   Antonieta Pert, MD [facility]  06/03/2021, 11:19 AM

## 2021-06-03 NOTE — Care Management Important Message (Signed)
Important Message  Patient Details IM Letter given to the Patient. Name: KYLOR VALVERDE MRN: [id number] Date of Birth: 1931/08/31   Medicare Important Message Given:  Yes     [name] 06/03/2021, 12:02 PM

## 2021-06-03 NOTE — Progress Notes (Signed)
[name] [name] for warfarin Indication: atrial fibrillation  No Known Allergies  Patient Measurements: Height: 6\' 4"  (193 cm) Weight: 82.6 kg (182 lb 1.6 oz) IBW/kg (Calculated) : 86.8   Vital Signs: Temp: 98.9 F (37.2 C) (07/18 0424) Temp Source: Oral (07/18 0424) BP: 98/51 (07/18 0424) Pulse Rate: 97 (07/18 0424)  Labs: Recent Labs    06/01/21 0508 06/02/21 0544 06/02/21 0820 06/03/21 0401  HGB 13.1 11.8*  --  11.5*  HCT 43.4 37.9*  --  36.9*  PLT 54* 49*  --  54*  LABPROT 30.0* 33.6*  --  35.2*  INR 2.9* 3.3*  --  3.5*  CREATININE 1.57*  --  1.23 1.32*     Estimated Creatinine Clearance: 45.2 mL/min (A) (by C-G formula based on SCr of 1.32 mg/dL (H)).   Medical History: Past Medical History:  Diagnosis Date   Abnormality of gait 05/27/2016   Adenomatous polyps    Carpal tunnel syndrome 06/25/2016   Right   Depressive disorder, not elsewhere classified    First degree AV block    Holter 3/18: NSR, PACs, PVCs, no AFib, no pauses.   Hereditary and idiopathic peripheral neuropathy 06/25/2016   Hypertension    Internal nasal lesion 05/15/2013   Melanoma ([place])    Left Shoulder   Mitral regurgitation    MVP (mitral valve prolapse)    a. With severe MR s/p Complex valvuloplasty including artificial Gore-tex neochord placement x4, chordal transposition x1, chordal release x1, # 32 mm Sorin Memo 3D Ring Annuloplasty 2012. // b. Echo 2/18: mild LVH, EF 50-55, mild AI, MV repair with mild MR, mod LAE, mod RVE, severe RAE, severe TR   Neuropathy    Normal coronary arteries    a. Normal coronary anatomy by cath 2012.   Osteoarthritis    Knees   PAF (paroxysmal atrial fibrillation) ([place])    a. Post-op MVR 2012.   Personal history of colonic polyps    Prostate cancer ([place])    Pulmonary HTN ([place])    a. Mild-mod by cath 2012.   Pure hypercholesterolemia    PVC (premature ventricular contraction)    Thrombocytopenia ([facility])    Vision  abnormalities    Cornea scarring    Medications:  Scheduled:   amiodarone  100 mg Oral Daily   cycloSPORINE  1 drop Both Eyes BID   fluticasone  2 spray Each Nare Daily   gabapentin  300 mg Oral BID   Gerhardt's butt cream   Topical TID   rosuvastatin  5 mg Oral Daily   traZODone  200 mg Oral QHS   Warfarin - Pharmacist Dosing Inpatient   Does not apply q1600    Assessment: 87 yo male presenting with weakness and dark urine (no hematuria reported).  Patient is on warfarin PTA for atrial fibrillation - per patient and ambulatory care flowsheet, regimen is warfarin 2.5mg  daily except for 5mg  on Fridays.  Patient has been getting INR checked approximately every 2 weeks with stable INR.  Last dose warfarin prior to admission was on 7/14.  INR on admission was therapeutic at 2.3.   PMH significant for chronic thrombocytopenia   06/03/21 8:34 AM  INR = 3.5 remains supratherapeutic, slightly increased CBC: Hgb & Plt both low but stable No bleeding reported  No significant drug interactions (on amiodarone PTA) Meal intake charted 100%  Goal of Therapy:  INR 2-3 Monitor platelets by anticoagulation protocol: Yes   Plan:  Hold warfarin today Monitor  INR daily Monitor CBC, signs of bleeding  Lenis Noon, PharmD 06/03/2021 8:34 AM

## 2021-06-04 LAB — CBC
HCT: 36.3 % — ABNORMAL LOW (ref 39.0–52.0)
Hemoglobin: 11.3 g/dL — ABNORMAL LOW (ref 13.0–17.0)
MCH: 28.2 pg (ref 26.0–34.0)
MCHC: 31.1 g/dL (ref 30.0–36.0)
MCV: 90.5 fL (ref 80.0–100.0)
Platelets: 67 10*3/uL — ABNORMAL LOW (ref 150–400)
RBC: 4.01 MIL/uL — ABNORMAL LOW (ref 4.22–5.81)
RDW: 16.4 % — ABNORMAL HIGH (ref 11.5–15.5)
WBC: 7.8 10*3/uL (ref 4.0–10.5)
nRBC: 0 % (ref 0.0–0.2)

## 2021-06-04 LAB — BASIC METABOLIC PANEL
Anion gap: 11 (ref 5–15)
BUN: 34 mg/dL — ABNORMAL HIGH (ref 8–23)
CO2: 26 mmol/L (ref 22–32)
Calcium: 8.4 mg/dL — ABNORMAL LOW (ref 8.9–10.3)
Chloride: 105 mmol/L (ref 98–111)
Creatinine, Ser: 1.09 mg/dL (ref 0.61–1.24)
GFR, Estimated: >60 mL/min (ref >60)
Glucose, Bld: 128 mg/dL — ABNORMAL HIGH (ref 70–99)
Potassium: 3.5 mmol/L (ref 3.5–5.1)
Sodium: 142 mmol/L (ref 135–145)

## 2021-06-04 LAB — PROTIME-INR
INR: 3.3 — ABNORMAL HIGH (ref 0.8–1.2)
Prothrombin Time: 33.4 seconds — ABNORMAL HIGH (ref 11.4–15.2)

## 2021-06-04 MED ORDER — WARFARIN SODIUM 5 MG PO TABS: 2.5000 mg | ORAL_TABLET | ORAL | Status: DC

## 2021-06-04 MED ORDER — CEPHALEXIN 500 MG PO CAPS: 500.0000 mg | ORAL_CAPSULE | Freq: Four times a day (QID) | ORAL | 0 refills | Status: AC

## 2021-06-04 MED ORDER — TORSEMIDE 20 MG PO TABS: 20.0000 mg | ORAL_TABLET | Freq: Every day | ORAL | Status: DC

## 2021-06-04 NOTE — Progress Notes (Signed)
[place] for warfarin Indication: atrial fibrillation  No Known Allergies  Patient Measurements: Height: 6\' 4"  (193 cm) Weight: 83.3 kg (183 lb 10.3 oz) IBW/kg (Calculated) : 86.8   Vital Signs: Temp: 98 F (36.7 C) (07/19 0455) Temp Source: Oral (07/19 0455) BP: 106/70 (07/19 0455) Pulse Rate: 89 (07/19 0455)  Labs: Recent Labs    06/02/21 0544 06/02/21 0820 06/03/21 0401 06/04/21 0416  HGB 11.8*  --  11.5* 11.3*  HCT 37.9*  --  36.9* 36.3*  PLT 49*  --  54* 67*  LABPROT 33.6*  --  35.2* 33.4*  INR 3.3*  --  3.5* 3.3*  CREATININE  --  1.23 1.32* 1.09     Estimated Creatinine Clearance: 55.2 mL/min (by C-G formula based on SCr of 1.09 mg/dL).   Medical History: Past Medical History:  Diagnosis Date   Abnormality of gait 05/27/2016   Adenomatous polyps    Carpal tunnel syndrome 06/25/2016   Right   Depressive disorder, not elsewhere classified    First degree AV block    Holter 3/18: NSR, PACs, PVCs, no AFib, no pauses.   Hereditary and idiopathic peripheral neuropathy 06/25/2016   Hypertension    Internal nasal lesion 05/15/2013   Melanoma ([place])    Left Shoulder   Mitral regurgitation    MVP (mitral valve prolapse)    a. With severe MR s/p Complex valvuloplasty including artificial Gore-tex neochord placement x4, chordal transposition x1, chordal release x1, # 32 mm Sorin Memo 3D Ring Annuloplasty 2012. // b. Echo 2/18: mild LVH, EF 50-55, mild AI, MV repair with mild MR, mod LAE, mod RVE, severe RAE, severe TR   Neuropathy    Normal coronary arteries    a. Normal coronary anatomy by cath 2012.   Osteoarthritis    Knees   PAF (paroxysmal atrial fibrillation) ([place])    a. Post-op MVR 2012.   Personal history of colonic polyps    Prostate cancer ([place])    Pulmonary HTN ([place])    a. Mild-mod by cath 2012.   Pure hypercholesterolemia    PVC (premature ventricular contraction)    Thrombocytopenia (H[facility])    Vision  abnormalities    Cornea scarring    Medications:  Scheduled:   amiodarone  100 mg Oral Daily   cycloSPORINE  1 drop Both Eyes BID   fluticasone  2 spray Each Nare Daily   gabapentin  300 mg Oral BID   Gerhardt's butt cream   Topical TID   rosuvastatin  5 mg Oral Daily   traZODone  200 mg Oral QHS   Warfarin - Pharmacist Dosing Inpatient   Does not apply q1600    Assessment: 87 yo male presenting with weakness and dark urine (no hematuria reported).  Patient is on warfarin PTA for atrial fibrillation - per patient and ambulatory care flowsheet, regimen is warfarin 2.5mg  daily except for 5mg  on Fridays.  Patient has been getting INR checked approximately every 2 weeks with stable INR.  Last dose warfarin prior to admission was on 7/14.  INR on admission was therapeutic at 2.3.   PMH significant for chronic thrombocytopenia   06/04/21 8:13 AM  INR = 3.3 remains slightly elevated, trending down CBC: Hgb & Plt both low but stable No bleeding reported  No major drug interactions (on amiodarone PTA). Ceftriaxone and cephalexin (planned antibiotic at discharge) have some potential to enhance effects of warfarin. Meal intake not charted yesterday; Albumin WNL  Goal of Therapy:  INR 2-3 Monitor platelets by anticoagulation protocol: Yes   Plan:  Hold warfarin again today Monitor INR daily while inpatient Monitor CBC, signs of bleeding  If patient discharges today, recommend discharging on warfarin 2.5 mg PO daily (beginning on 7/20). Recommend INR check within 48 hours of hospital discharge to guide further dosing.   Lenis Noon, PharmD 06/04/2021 8:13 AM

## 2021-06-04 NOTE — Plan of Care (Signed)
  Problem: Education: Goal: Knowledge of General Education information will improve Description: Including pain rating scale, medication(s)/side effects and non-pharmacologic comfort measures Outcome: Progressing   Problem: Clinical Measurements: Goal: Ability to maintain clinical measurements within normal limits will improve Outcome: Progressing Goal: Cardiovascular complication will be avoided Outcome: Progressing   Problem: Safety: Goal: Ability to remain free from injury will improve Outcome: Progressing

## 2021-06-04 NOTE — Progress Notes (Signed)
Pt to be discharged to [facility] in [place] today. Report called to this facility [facility] LPN accepting report for this facility. No acute changes noted with Pt's assessment at discharge

## 2021-06-04 NOTE — TOC Transition Note (Addendum)
Transition of Care [facility]) - CM/SW Discharge Note   Patient Details  Name: [name] [name] MRN: [id number] Date of Birth: Mar 24, 1931  Transition of Care [facility]) CM/SW Contact:  Ross Ludwig, LCSW Phone Number: 06/04/2021, 10:41 AM   Clinical Narrative:     Patient to be d/c'ed today to [place] room 202.  Patient and family agreeable to plans will transport via ems RN to call report at [phone].  Patient's daughter Manuela Schwartz [phone] is aware of discharge for today and would like to be called once EMS arrives because she is going to meet him there.  Final next level of care: Skilled Nursing Facility Barriers to Discharge: Barriers Resolved   Patient Goals and CMS Choice Patient states their goals for this hospitalization and ongoing recovery are:: To go to SNF for short term rehab, then return back home. CMS Medicare.gov Compare Post Acute Care list provided to:: Patient Represenative (must comment) Choice offered to / list presented to : Adult Children  Discharge Placement   Existing PASRR number confirmed : 06/03/21          Patient chooses bed at: [facility] [place] Patient to be transferred to facility by: [facility] Name of family member notified: Anderson [place] daughter [phone] Patient and family notified of of transfer: 06/04/21  Discharge Plan and Services   Discharge Planning Services: CM Consult Post Acute Care Choice: [place]                               Social Determinants of Health (SDOH) Interventions     Readmission Risk Interventions Readmission Risk Prevention Plan 06/02/2021 04/06/2019  Transportation Screening Complete Complete  PCP or Specialist Appt within 3-5 Days Complete Not Complete  Not Complete comments - not yet ready for d/c  [place] or [place] Complete Complete  Social Work Consult for [place] Planning/Counseling Complete Not Complete  SW consult not completed comments - no needs  identified  Palliative Care Screening Not Applicable Not Applicable  Medication Review Press photographer) Complete Complete  Some recent data might be hidden

## 2021-06-13 ENCOUNTER — Telehealth: Payer: Self-pay

## 2021-06-13 NOTE — Telephone Encounter (Signed)
Pt discharged from Hospital on 7/19 to [facility]. Contacted SNF and spoke with Stanton Kidney, RN. Confirmed facility was managing pt's INR/Coumadin dosing.

## 2021-06-18 ENCOUNTER — Other Ambulatory Visit: Payer: Medicare Other

## 2021-06-23 ENCOUNTER — Other Ambulatory Visit (HOSPITAL_COMMUNITY): Payer: Self-pay | Admitting: Internal Medicine

## 2021-06-25 ENCOUNTER — Telehealth: Payer: Self-pay

## 2021-06-25 NOTE — Telephone Encounter (Signed)
Larene Beach, nurse with [facility], called stating she went to pt's house today to do admission, but her machine would not work, even after rebooting.  She is asking for VO for someone to go back tomorrow to see pt to obtain INR.  CB # [id number].

## 2021-06-25 NOTE — Telephone Encounter (Signed)
Vo done

## 2021-06-26 ENCOUNTER — Ambulatory Visit (INDEPENDENT_AMBULATORY_CARE_PROVIDER_SITE_OTHER): Payer: Medicare Other

## 2021-06-26 DIAGNOSIS — I480 Paroxysmal atrial fibrillation: Secondary | ICD-10-CM | POA: Diagnosis not present

## 2021-06-26 DIAGNOSIS — Z5181 Encounter for therapeutic drug level monitoring: Secondary | ICD-10-CM | POA: Diagnosis not present

## 2021-06-26 LAB — POCT INR: INR: 2 (ref 2.0–3.0)

## 2021-06-26 NOTE — Patient Instructions (Addendum)
    Description   Spoke with Randell Patient, RN with [facility] while in home with pt instructed pt to continue taking 1/2 tablet daily except 1 tablet on Fridays. Recheck  INR in 1 week by [facility] [phone]).  Discharged from rehab on Monday 06/24/21.

## 2021-07-04 ENCOUNTER — Ambulatory Visit (INDEPENDENT_AMBULATORY_CARE_PROVIDER_SITE_OTHER): Payer: Medicare Other | Admitting: *Deleted

## 2021-07-04 DIAGNOSIS — I480 Paroxysmal atrial fibrillation: Secondary | ICD-10-CM

## 2021-07-04 DIAGNOSIS — Z5181 Encounter for therapeutic drug level monitoring: Secondary | ICD-10-CM

## 2021-07-04 LAB — POCT INR: INR: 2.5 (ref 2.0–3.0)

## 2021-07-11 ENCOUNTER — Ambulatory Visit (INDEPENDENT_AMBULATORY_CARE_PROVIDER_SITE_OTHER): Payer: Medicare Other | Admitting: Family Medicine

## 2021-07-11 ENCOUNTER — Encounter: Payer: Self-pay | Admitting: Family Medicine

## 2021-07-11 ENCOUNTER — Other Ambulatory Visit: Payer: Self-pay

## 2021-07-11 VITALS — BP 116/64 | HR 81 | Temp 98.4°F | Resp 16

## 2021-07-11 DIAGNOSIS — R319 Hematuria, unspecified: Secondary | ICD-10-CM

## 2021-07-11 DIAGNOSIS — A419 Sepsis, unspecified organism: Secondary | ICD-10-CM | POA: Diagnosis not present

## 2021-07-11 DIAGNOSIS — N390 Urinary tract infection, site not specified: Secondary | ICD-10-CM | POA: Diagnosis not present

## 2021-07-11 DIAGNOSIS — D696 Thrombocytopenia, unspecified: Secondary | ICD-10-CM

## 2021-07-11 DIAGNOSIS — N189 Chronic kidney disease, unspecified: Secondary | ICD-10-CM

## 2021-07-11 DIAGNOSIS — I10 Essential (primary) hypertension: Secondary | ICD-10-CM | POA: Diagnosis not present

## 2021-07-11 DIAGNOSIS — R739 Hyperglycemia, unspecified: Secondary | ICD-10-CM

## 2021-07-11 NOTE — Patient Instructions (Addendum)
AZO cranberry tabs daily NOW or Phillp's Colon Health or Culturelle probiotic daily  Urinary Tract Infection, Adult  A urinary tract infection (UTI) is an infection of any part of the urinary tract. The urinary tract includes the kidneys, ureters, bladder, and urethra.These organs make, store, and get rid of urine in the body. An upper UTI affects the ureters and kidneys. A lower UTI affects the bladderand urethra. What are the causes? Most urinary tract infections are caused by bacteria in your genital area around your urethra, where urine leaves your body. These bacteria grow andcause inflammation of your urinary tract. What increases the risk? You are more likely to develop this condition if: You have a urinary catheter that stays in place. You are not able to control when you urinate or have a bowel movement (incontinence). You are male and you: Use a spermicide or diaphragm for birth control. Have low estrogen levels. Are pregnant. You have certain genes that increase your risk. You are sexually active. You take antibiotic medicines. You have a condition that causes your flow of urine to slow down, such as: An enlarged prostate, if you are male. Blockage in your urethra. A kidney stone. A nerve condition that affects your bladder control (neurogenic bladder). Not getting enough to drink, or not urinating often. You have certain medical conditions, such as: Diabetes. A weak disease-fighting system (immunesystem). Sickle cell disease. Gout. Spinal cord injury. What are the signs or symptoms? Symptoms of this condition include: Needing to urinate right away (urgency). Frequent urination. This may include small amounts of urine each time you urinate. Pain or burning with urination. Blood in the urine. Urine that smells bad or unusual. Trouble urinating. Cloudy urine. Vaginal discharge, if you are male. Pain in the abdomen or the lower back. You may also have: Vomiting  or a decreased appetite. Confusion. Irritability or tiredness. A fever or chills. Diarrhea. The first symptom in older adults may be confusion. In some cases, they may nothave any symptoms until the infection has worsened. How is this diagnosed? This condition is diagnosed based on your medical history and a physical exam. You may also have other tests, including: Urine tests. Blood tests. Tests for STIs (sexually transmitted infections). If you have had more than one UTI, a cystoscopy or imaging studies may be doneto determine the cause of the infections. How is this treated? Treatment for this condition includes: Antibiotic medicine. Over-the-counter medicines to treat discomfort. Drinking enough water to stay hydrated. If you have frequent infections or have other conditions such as a kidney stone, you may need to see a health care provider who specializes in the urinary tract (urologist). In rare cases, urinary tract infections can cause sepsis. Sepsis is a life-threatening condition that occurs when the body responds to an infection. Sepsis is treated in the hospital with IV antibiotics, fluids, and othermedicines. Follow these instructions at home:  Medicines Take over-the-counter and prescription medicines only as told by your health care provider. If you were prescribed an antibiotic medicine, take it as told by your health care provider. Do not stop using the antibiotic even if you start to feel better. General instructions Make sure you: Empty your bladder often and completely. Do not hold urine for long periods of time. Empty your bladder after sex. Wipe from front to back after urinating or having a bowel movement if you are male. Use each tissue only one time when you wipe. Drink enough fluid to keep your urine pale yellow. Keep all  follow-up visits. This is important. Contact a health care provider if: Your symptoms do not get better after 1-2 days. Your symptoms go  away and then return. Get help right away if: You have severe pain in your back or your lower abdomen. You have a fever or chills. You have nausea or vomiting. Summary A urinary tract infection (UTI) is an infection of any part of the urinary tract, which includes the kidneys, ureters, bladder, and urethra. Most urinary tract infections are caused by bacteria in your genital area. Treatment for this condition often includes antibiotic medicines. If you were prescribed an antibiotic medicine, take it as told by your health care provider. Do not stop using the antibiotic even if you start to feel better. Keep all follow-up visits. This is important. This information is not intended to replace advice given to you by your health care provider. Make sure you discuss any questions you have with your healthcare provider. Document Revised: 06/15/2020 Document Reviewed: 06/15/2020 Elsevier Patient Education  [place].

## 2021-07-11 NOTE — Progress Notes (Signed)
Patient ID: [name] [name], male    DOB: 02/21/1931  Age: 87 y.o. MRN: [id number]    Subjective:  Subjective  HPI [name] [name] presents for office visit today for follow up on UTI and recent hospitalization. His Caregiver reports that it initially started with blood urine, then by Thursday he was experiencing shakiness and by Friday morning he progressed into being delirious. At the moment, he is doing a lot better and has improved. Bed risers were installed for him to assist him getting out of bed. He endorsing having a good appetite, eating good, and having good bowel movements at the moment with no color in urine. Denies CP/palp/SOB/HA/congestion/fevers/GI or GU c/o. Taking meds as prescribed.   Review of Systems  Constitutional:  Negative for chills, fatigue and fever.  HENT:  Negative for congestion, rhinorrhea, sinus pressure, sinus pain and sore throat.   Eyes:  Negative for pain.  Respiratory:  Negative for cough and shortness of breath.   Cardiovascular:  Negative for chest pain, palpitations and leg swelling.  Gastrointestinal:  Negative for abdominal pain, blood in stool, diarrhea, nausea and vomiting.  Genitourinary:  Negative for flank pain, frequency and penile pain.  Musculoskeletal:  Negative for back pain.  Neurological:  Negative for headaches.   History Past Medical History:  Diagnosis Date   Abnormality of gait 05/27/2016   Adenomatous polyps    Carpal tunnel syndrome 06/25/2016   Right   Depressive disorder, not elsewhere classified    First degree AV block    Holter 3/18: NSR, PACs, PVCs, no AFib, no pauses.   Hereditary and idiopathic peripheral neuropathy 06/25/2016   Hypertension    Internal nasal lesion 05/15/2013   Melanoma ([place])    Left Shoulder   Mitral regurgitation    MVP (mitral valve prolapse)    a. With severe MR s/p Complex valvuloplasty including artificial Gore-tex neochord placement x4, chordal transposition x1, chordal release x1, # 32 mm Sorin  Memo 3D Ring Annuloplasty 2012. // b. Echo 2/18: mild LVH, EF 50-55, mild AI, MV repair with mild MR, mod LAE, mod RVE, severe RAE, severe TR   Neuropathy    Normal coronary arteries    a. Normal coronary anatomy by cath 2012.   Osteoarthritis    Knees   PAF (paroxysmal atrial fibrillation) ([place])    a. Post-op MVR 2012.   Personal history of colonic polyps    Prostate cancer ([place])    Pulmonary HTN ([place])    a. Mild-mod by cath 2012.   Pure hypercholesterolemia    PVC (premature ventricular contraction)    Thrombocytopenia (HCC)    Vision abnormalities    Cornea scarring    He has a past surgical history that includes Nuclear Stress Test (09/2006); US ECHOCARDIOGRAPHY (09/2009, 08/1011); Inguinal hernia repair (09/2009); Knee arthroscopy; Colonoscopy w/ polypectomy; Prostatectomy (1993); Rotator cuff repair (2003); Melanoma Surgery; TEE without cardioversion (09/26/2011); Mitral valve repair (10/01/2011); Root canal (08-19-12); Cardiac catheterization (09/2011); Cardioversion (N/A, 01/02/2016); and RIGHT HEART CATH (N/A, 04/29/2017).   His family history includes Arthritis in his mother; Clotting disorder in his brother; Hypertension in his father and mother; Psychosis in his father; Stroke in his mother.He reports that he has never smoked. He has never used smokeless tobacco. He reports that he does not drink alcohol and does not use drugs.  Current Outpatient Medications on File Prior to Visit  Medication Sig Dispense Refill   acetaminophen (TYLENOL) 500 MG tablet Take 1,000 mg by mouth every 6 (six)  hours as needed for mild pain.     amiodarone (PACERONE) 200 MG tablet TAKE ONE-HALF TABLET BY  MOUTH DAILY 45 tablet 3   b complex vitamins tablet Take 1 tablet by mouth daily.     cycloSPORINE (RESTASIS) 0.05 % ophthalmic emulsion Place 1 drop into both eyes 2 (two) times daily.     fluticasone (FLONASE) 50 MCG/ACT nasal spray Place 2 sprays into both nostrils daily.   11   gabapentin  (NEURONTIN) 300 MG capsule TAKE 1 CAPSULE BY MOUTH  TWICE DAILY (Patient taking differently: Take 300 mg by mouth 2 (two) times daily.) 180 capsule 3   Multiple Vitamin (MULTIVITAMIN WITH MINERALS) TABS tablet Take 1 tablet by mouth every evening.      potassium chloride SA (KLOR-CON M20) 20 MEQ tablet Take 2 tablets (40 mEq total) by mouth daily. 180 tablet 3   rosuvastatin (CRESTOR) 5 MG tablet TAKE 1 TABLET BY MOUTH  DAILY (Patient taking differently: Take 5 mg by mouth daily.) 90 tablet 3   torsemide (DEMADEX) 20 MG tablet Take 1 tablet (20 mg total) by mouth daily.     traZODone (DESYREL) 100 MG tablet Take 200 mg by mouth at bedtime.     warfarin (COUMADIN) 5 MG tablet Take 0.5-1 tablets (2.5-5 mg total) by mouth See admin instructions. Taking 2.5 mg on all days except 5 mg on Friday.     [DISCONTINUED] pantoprazole (PROTONIX) 40 MG tablet Take 1 tablet (40 mg total) by mouth daily before breakfast.     No current facility-administered medications on file prior to visit.     Objective:  Objective  Physical Exam Constitutional:      General: He is not in acute distress.    Appearance: Normal appearance. He is not ill-appearing or toxic-appearing.  HENT:     Head: Normocephalic and atraumatic.     Right Ear: Tympanic membrane, ear canal and external ear normal.     Left Ear: Tympanic membrane, ear canal and external ear normal.     Nose: No congestion or rhinorrhea.  Eyes:     Extraocular Movements: Extraocular movements intact.     Pupils: Pupils are equal, round, and reactive to light.  Cardiovascular:     Rate and Rhythm: Normal rate and regular rhythm.     Pulses: Normal pulses.     Heart sounds: Normal heart sounds. No murmur heard. Pulmonary:     Effort: Pulmonary effort is normal. No respiratory distress.     Breath sounds: Normal breath sounds. No wheezing, rhonchi or rales.  Abdominal:     General: Bowel sounds are normal.     Palpations: Abdomen is soft. There is no  mass.     Tenderness: no abdominal tenderness There is no guarding.     Hernia: No hernia is present.  Musculoskeletal:        General: Normal range of motion.     Cervical back: Normal range of motion and neck supple.  Skin:    General: Skin is warm and dry.  Neurological:     Mental Status: He is alert and oriented to person, place, and time.  Psychiatric:        Behavior: Behavior normal.   BP 116/64   Pulse 81   Temp 98.4 F (36.9 C)   Resp 16   SpO2 93%  Wt Readings from Last 3 Encounters:  06/04/21 183 lb 10.3 oz (83.3 kg)  05/08/21 173 lb (78.5 kg)  03/28/21 173 lb (78.5  kg)     Lab Results  Component Value Date   WBC 7.8 06/04/2021   HGB 11.3 (L) 06/04/2021   HCT 36.3 (L) 06/04/2021   PLT 67 (L) 06/04/2021   GLUCOSE 128 (H) 06/04/2021   CHOL 108 05/14/2021   TRIG 96.0 05/14/2021   HDL 42.00 05/14/2021   LDLCALC 47 05/14/2021   ALT 21 06/01/2021   AST 44 (H) 06/01/2021   NA 142 06/04/2021   K 3.5 06/04/2021   CL 105 06/04/2021   CREATININE 1.09 06/04/2021   BUN 34 (H) 06/04/2021   CO2 26 06/04/2021   TSH 1.14 05/14/2021   PSA 1.70 09/11/2020   INR 2.5 07/04/2021   HGBA1C 6.3 05/14/2021    DG Chest Port 1 View  Result Date: 05/31/2021 CLINICAL DATA:  87 year old male with weakness, atrial fibrillation. EXAM: PORTABLE CHEST 1 VIEW COMPARISON:  Chest radiograph 05/11/2019 and earlier. FINDINGS: Portable AP semi upright view at 1006 hours. Stable cardiomegaly and mediastinal contours. Previous sternotomy and valve replacement. Small left pleural effusion versus chronic pleural scarring, not significantly changed from 2020. No pneumothorax or pulmonary edema. Right lung appears clear. Visualized tracheal air column is within normal limits. No acute osseous abnormality identified. IMPRESSION: Cardiomegaly with small left pleural versus chronic pleural scarring. Electronically Signed   By: Genevie Ann M.D.   On: 05/31/2021 10:49     Assessment & Plan:  Plan     No orders of the defined types were placed in this encounter.   Problem List Items Addressed This Visit     Thrombocytopenia ([place])    No signs of recent bleeding, continue to monitoring.      HYPERTENSION, BENIGN ESSENTIAL    Well controlled, no changes to meds. Encouraged heart healthy diet such as the DASH diet and exercise as tolerated.       Chronic renal insufficiency    Hydrate and monitor      Hyperglycemia    hgba1c acceptable, minimize simple carbs. Increase exercise as tolerated.       Sepsis [facility])    Doing much better after recent hospitalization and stay in SNF. He started with a UTI and became very ill over a week before being hospitalized. His urine will be rechecked today and he is reminded to stay well hydrated, call if any symptoms develop so we can check urinalysis and culture quickly if new symptoms develop      Other Visit Diagnoses     Urinary tract infection with hematuria, site unspecified    -  Primary   Relevant Orders   Urinalysis   Urine Culture (Completed)   Urinalysis   Urine Culture       Follow-up: No follow-ups on file.  I, Suezanne Jacquet, acting as a scribe for Penni Homans, MD, have documented all relevent documentation on behalf of Penni Homans, MD, as directed by Penni Homans, MD while in the presence of Penni Homans, MD.  I, [name], MD personally performed the services described in this documentation. All medical record entries made by the scribe were at my direction and in my presence. I have reviewed the chart and agree that the record reflects my personal performance and is accurate and complete

## 2021-07-12 LAB — URINALYSIS, ROUTINE W REFLEX MICROSCOPIC
Bilirubin Urine: NEGATIVE
Hgb urine dipstick: NEGATIVE
Ketones, ur: NEGATIVE
Nitrite: NEGATIVE
Specific Gravity, Urine: 1.01 (ref 1.000–1.030)
Total Protein, Urine: NEGATIVE
Urine Glucose: NEGATIVE
Urobilinogen, UA: 0.2 (ref 0.0–1.0)
pH: 6 (ref 5.0–8.0)

## 2021-07-12 LAB — URINE CULTURE
MICRO NUMBER:: 12291169
SPECIMEN QUALITY:: ADEQUATE

## 2021-07-14 NOTE — Assessment & Plan Note (Signed)
Hydrate and monitor 

## 2021-07-14 NOTE — Assessment & Plan Note (Signed)
Well controlled, no changes to meds. Encouraged heart healthy diet such as the DASH diet and exercise as tolerated.  °

## 2021-07-14 NOTE — Assessment & Plan Note (Signed)
No signs of recent bleeding, continue to monitoring.

## 2021-07-14 NOTE — Assessment & Plan Note (Signed)
Doing much better after recent hospitalization and stay in SNF. He started with a UTI and became very ill over a week before being hospitalized. His urine will be rechecked today and he is reminded to stay well hydrated, call if any symptoms develop so we can check urinalysis and culture quickly if new symptoms develop

## 2021-07-14 NOTE — Assessment & Plan Note (Signed)
hgba1c acceptable, minimize simple carbs. Increase exercise as tolerated.  

## 2021-07-18 ENCOUNTER — Ambulatory Visit (INDEPENDENT_AMBULATORY_CARE_PROVIDER_SITE_OTHER): Payer: Medicare Other | Admitting: Cardiology

## 2021-07-18 DIAGNOSIS — Z5181 Encounter for therapeutic drug level monitoring: Secondary | ICD-10-CM | POA: Diagnosis not present

## 2021-07-18 LAB — POCT INR: INR: 1.9 — AB (ref 2.0–3.0)

## 2021-07-18 NOTE — Patient Instructions (Addendum)
Description   Spoke with Randell Patient, RN with [facility] while in home with pt instructed pt to take 1 tablet of warfarin today and then continue taking 1/2 tablet daily except 1 tablet on Fridays. Recheck  INR in 1 week by [facility] [phone]).  Discharged from rehab on Monday 06/24/21.

## 2021-07-31 ENCOUNTER — Ambulatory Visit (INDEPENDENT_AMBULATORY_CARE_PROVIDER_SITE_OTHER): Payer: Medicare Other

## 2021-07-31 DIAGNOSIS — I480 Paroxysmal atrial fibrillation: Secondary | ICD-10-CM

## 2021-07-31 DIAGNOSIS — Z5181 Encounter for therapeutic drug level monitoring: Secondary | ICD-10-CM | POA: Diagnosis not present

## 2021-07-31 LAB — POCT INR: INR: 2.7 (ref 2.0–3.0)

## 2021-07-31 NOTE — Patient Instructions (Signed)
Description   Spoke with Randell Patient, RN with [facility] while in home with pt instructed her to have pt continue taking 1/2 tablet daily except 1 tablet on Fridays. Recheck  INR in 2 weeks in office, [facility] [phone]) discharging pt.

## 2021-08-06 ENCOUNTER — Telehealth: Payer: Self-pay | Admitting: Family Medicine

## 2021-08-06 NOTE — Telephone Encounter (Signed)
[name] [name] has a meeting that AM.  I called patient last week to reschedule the OG appt he had. We resched to 10.13.  I had to call patient again to get him rescheduled since [name] [name] has a meeting that has been set for that morning now  Patient states he can only do morning appts.  There is a opening in nov I have on hold if we can go 4 months instead of 3....   OR is there ANYWAY we can work him in anywhere??????

## 2021-08-14 ENCOUNTER — Ambulatory Visit (INDEPENDENT_AMBULATORY_CARE_PROVIDER_SITE_OTHER): Payer: Medicare Other

## 2021-08-14 ENCOUNTER — Other Ambulatory Visit: Payer: Self-pay

## 2021-08-14 DIAGNOSIS — I4891 Unspecified atrial fibrillation: Secondary | ICD-10-CM | POA: Diagnosis not present

## 2021-08-14 DIAGNOSIS — Z5181 Encounter for therapeutic drug level monitoring: Secondary | ICD-10-CM | POA: Diagnosis not present

## 2021-08-14 LAB — POCT INR: INR: 1.8 — AB (ref 2.0–3.0)

## 2021-08-14 NOTE — Patient Instructions (Signed)
Description   Take 1 tablet today and then continue taking 1/2 tablet daily except 1 tablet on Fridays. Recheck INR in 2 weeks in office

## 2021-08-22 ENCOUNTER — Ambulatory Visit: Payer: Medicare Other | Admitting: Family Medicine

## 2021-08-26 ENCOUNTER — Telehealth: Payer: Self-pay

## 2021-08-26 NOTE — Telephone Encounter (Addendum)
Received call from [place], [place] with [facility] stating patient had an appt to have INR checked Thursday in the clinic; however, she has to go visit pt Wednesday (08/28/21) and wanted to know if she could check pt's INR and his app on Thursday could be cancelled. Let Charlene, RN know this is okay and INR can be checked Wednesday at home. Verbalized understanding.

## 2021-08-28 ENCOUNTER — Ambulatory Visit (INDEPENDENT_AMBULATORY_CARE_PROVIDER_SITE_OTHER): Payer: Medicare Other

## 2021-08-28 DIAGNOSIS — Z7901 Long term (current) use of anticoagulants: Secondary | ICD-10-CM

## 2021-08-28 DIAGNOSIS — Z5181 Encounter for therapeutic drug level monitoring: Secondary | ICD-10-CM | POA: Diagnosis not present

## 2021-08-28 LAB — POCT INR: INR: 2.2 (ref 2.0–3.0)

## 2021-08-28 NOTE — Patient Instructions (Signed)
Description   Spoke with Randell Patient, RN with [facility] while in home with pt instructed her to have pt continue taking 1/2 tablet daily except 1 tablet on Fridays. Recheck INR in 2 weeks.

## 2021-08-29 ENCOUNTER — Ambulatory Visit: Payer: Medicare Other | Admitting: Family Medicine

## 2021-09-03 ENCOUNTER — Ambulatory Visit (INDEPENDENT_AMBULATORY_CARE_PROVIDER_SITE_OTHER): Payer: Medicare Other | Admitting: Family Medicine

## 2021-09-03 ENCOUNTER — Other Ambulatory Visit: Payer: Self-pay

## 2021-09-03 VITALS — BP 130/82 | HR 92 | Temp 97.6°F | Resp 16

## 2021-09-03 DIAGNOSIS — R739 Hyperglycemia, unspecified: Secondary | ICD-10-CM

## 2021-09-03 DIAGNOSIS — I10 Essential (primary) hypertension: Secondary | ICD-10-CM

## 2021-09-03 DIAGNOSIS — N390 Urinary tract infection, site not specified: Secondary | ICD-10-CM

## 2021-09-03 DIAGNOSIS — L89621 Pressure ulcer of left heel, stage 1: Secondary | ICD-10-CM

## 2021-09-03 DIAGNOSIS — D649 Anemia, unspecified: Secondary | ICD-10-CM

## 2021-09-03 LAB — COMPREHENSIVE METABOLIC PANEL
ALT: 9 U/L (ref 0–53)
AST: 14 U/L (ref 0–37)
Albumin: 3.7 g/dL (ref 3.5–5.2)
Alkaline Phosphatase: 39 U/L (ref 39–117)
BUN: 24 mg/dL — ABNORMAL HIGH (ref 6–23)
CO2: 30 mEq/L (ref 19–32)
Calcium: 9.4 mg/dL (ref 8.4–10.5)
Chloride: 104 mEq/L (ref 96–112)
Creatinine, Ser: 1.04 mg/dL (ref 0.40–1.50)
GFR: 64.14 mL/min (ref >60.00)
Glucose, Bld: 116 mg/dL — ABNORMAL HIGH (ref 70–99)
Potassium: 4.3 mEq/L (ref 3.5–5.1)
Sodium: 141 mEq/L (ref 135–145)
Total Bilirubin: 0.7 mg/dL (ref 0.2–1.2)
Total Protein: 6.5 g/dL (ref 6.0–8.3)

## 2021-09-03 LAB — LIPID PANEL
Cholesterol: 109 mg/dL (ref 0–200)
HDL: 38.3 mg/dL — ABNORMAL LOW
LDL Cholesterol: 51 mg/dL (ref 0–99)
NonHDL: 70.22
Total CHOL/HDL Ratio: 3
Triglycerides: 95 mg/dL (ref 0.0–149.0)
VLDL: 19 mg/dL (ref 0.0–40.0)

## 2021-09-03 LAB — CBC
HCT: 41.6 % (ref 39.0–52.0)
Hemoglobin: 13.5 g/dL (ref 13.0–17.0)
MCHC: 32.6 g/dL (ref 30.0–36.0)
MCV: 89.4 fl (ref 78.0–100.0)
Platelets: 75 K/uL — ABNORMAL LOW (ref 150.0–400.0)
RBC: 4.65 Mil/uL (ref 4.22–5.81)
RDW: 15.5 % (ref 11.5–15.5)
WBC: 6 K/uL (ref 4.0–10.5)

## 2021-09-03 LAB — VITAMIN D 25 HYDROXY (VIT D DEFICIENCY, FRACTURES): VITD: 42.62 ng/mL (ref 30.00–100.00)

## 2021-09-03 LAB — HEMOGLOBIN A1C: Hgb A1c MFr Bld: 6.2 % (ref 4.6–6.5)

## 2021-09-03 LAB — TSH: TSH: 1.6 u[IU]/mL (ref 0.35–5.50)

## 2021-09-03 NOTE — Progress Notes (Signed)
Patient ID: [name] [name], male    DOB: 1930-11-19  Age: 87 y.o. MRN: [id number]    Subjective:   No chief complaint on file.  Subjective   HPI [name] [name] presents for office visit today for follow up on previous UTI and Afib. One of his wounds local to his ankle have opened again with some drainage, so he will get it checked again by Dr. Welton Flakes. He reports that it has occurred 5 weeks ago. He endorses having taking b-complex, multivitamins, and eating fish at least once a week. Denies CP/palp/SOB/HA/congestion/fevers/GI or GU c/o. Taking meds as prescribed.  Squamous cells removed from scalp by derm Dr. Danella Sensing.   Review of Systems  Constitutional:  Negative for chills, fatigue and fever.  HENT:  Negative for congestion, rhinorrhea, sinus pressure, sinus pain and sore throat.   Eyes:  Negative for pain.  Respiratory:  Negative for cough and shortness of breath.   Cardiovascular:  Negative for chest pain, palpitations and leg swelling.  Gastrointestinal:  Negative for abdominal pain, blood in stool, diarrhea, nausea and vomiting.  Genitourinary:  Negative for flank pain, frequency and penile pain.  Musculoskeletal:  Negative for back pain.  Skin:  Positive for wound (with discharge).  Neurological:  Negative for headaches.   History Past Medical History:  Diagnosis Date   Abnormality of gait 05/27/2016   Adenomatous polyps    Carpal tunnel syndrome 06/25/2016   Right   Depressive disorder, not elsewhere classified    First degree AV block    Holter 3/18: NSR, PACs, PVCs, no AFib, no pauses.   Hereditary and idiopathic peripheral neuropathy 06/25/2016   Hypertension    Internal nasal lesion 05/15/2013   Melanoma ([place])    Left Shoulder   Mitral regurgitation    MVP (mitral valve prolapse)    a. With severe MR s/p Complex valvuloplasty including artificial Gore-tex neochord placement x4, chordal transposition x1, chordal release x1, # 32 mm Sorin Memo 3D Ring  Annuloplasty 2012. // b. Echo 2/18: mild LVH, EF 50-55, mild AI, MV repair with mild MR, mod LAE, mod RVE, severe RAE, severe TR   Neuropathy    Normal coronary arteries    a. Normal coronary anatomy by cath 2012.   Osteoarthritis    Knees   PAF (paroxysmal atrial fibrillation) ([place])    a. Post-op MVR 2012.   Personal history of colonic polyps    Prostate cancer ([place])    Pulmonary HTN ([place])    a. Mild-mod by cath 2012.   Pure hypercholesterolemia    PVC (premature ventricular contraction)    Thrombocytopenia ([facility])    Vision abnormalities    Cornea scarring    He has a past surgical history that includes Nuclear Stress Test (09/2006); US ECHOCARDIOGRAPHY (09/2009, 08/1011); Inguinal hernia repair (09/2009); Knee arthroscopy; Colonoscopy w/ polypectomy; Prostatectomy (1993); Rotator cuff repair (2003); Melanoma Surgery; TEE without cardioversion (09/26/2011); Mitral valve repair (10/01/2011); Root canal (08-19-12); Cardiac catheterization (09/2011); Cardioversion (N/A, 01/02/2016); and RIGHT HEART CATH (N/A, 04/29/2017).   His family history includes Arthritis in his mother; Clotting disorder in his brother; Hypertension in his father and mother; Psychosis in his father; Stroke in his mother.He reports that he has never smoked. He has never used smokeless tobacco. He reports that he does not drink alcohol and does not use drugs.  Current Outpatient Medications on File Prior to Visit  Medication Sig Dispense Refill   acetaminophen (TYLENOL) 500 MG tablet Take 1,000 mg by mouth every  6 (six) hours as needed for mild pain.     amiodarone (PACERONE) 200 MG tablet TAKE ONE-HALF TABLET BY  MOUTH DAILY 45 tablet 3   b complex vitamins tablet Take 1 tablet by mouth daily.     cycloSPORINE (RESTASIS) 0.05 % ophthalmic emulsion Place 1 drop into both eyes 2 (two) times daily.     fluticasone (FLONASE) 50 MCG/ACT nasal spray Place 2 sprays into both nostrils daily.   11   gabapentin (NEURONTIN) 300 MG  capsule TAKE 1 CAPSULE BY MOUTH  TWICE DAILY (Patient taking differently: Take 300 mg by mouth 2 (two) times daily.) 180 capsule 3   Multiple Vitamin (MULTIVITAMIN WITH MINERALS) TABS tablet Take 1 tablet by mouth every evening.      potassium chloride SA (KLOR-CON M20) 20 MEQ tablet Take 2 tablets (40 mEq total) by mouth daily. 180 tablet 3   rosuvastatin (CRESTOR) 5 MG tablet TAKE 1 TABLET BY MOUTH  DAILY (Patient taking differently: Take 5 mg by mouth daily.) 90 tablet 3   torsemide (DEMADEX) 20 MG tablet Take 1 tablet (20 mg total) by mouth daily.     traZODone (DESYREL) 100 MG tablet Take 200 mg by mouth at bedtime.     warfarin (COUMADIN) 5 MG tablet Take 0.5-1 tablets (2.5-5 mg total) by mouth See admin instructions. Taking 2.5 mg on all days except 5 mg on Friday.     [DISCONTINUED] pantoprazole (PROTONIX) 40 MG tablet Take 1 tablet (40 mg total) by mouth daily before breakfast.     No current facility-administered medications on file prior to visit.     Objective:  Objective  Physical Exam Constitutional:      General: He is not in acute distress.    Appearance: Normal appearance. He is not ill-appearing or toxic-appearing.  HENT:     Head: Normocephalic and atraumatic.     Right Ear: Tympanic membrane, ear canal and external ear normal.     Left Ear: Tympanic membrane, ear canal and external ear normal.     Nose: No congestion or rhinorrhea.  Eyes:     Extraocular Movements: Extraocular movements intact.     Pupils: Pupils are equal, round, and reactive to light.  Cardiovascular:     Rate and Rhythm: Normal rate and regular rhythm.     Pulses: Normal pulses.     Heart sounds: Normal heart sounds. No murmur heard. Pulmonary:     Effort: Pulmonary effort is normal. No respiratory distress.     Breath sounds: Normal breath sounds. No wheezing, rhonchi or rales.  Abdominal:     General: Bowel sounds are normal.     Palpations: Abdomen is soft. There is no mass.      Tenderness: There is no abdominal tenderness. There is no guarding.     Hernia: No hernia is present.  Musculoskeletal:        General: Normal range of motion.     Cervical back: Normal range of motion and neck supple.  Skin:    General: Skin is warm and dry.  Neurological:     Mental Status: He is alert and oriented to person, place, and time.  Psychiatric:        Behavior: Behavior normal.   BP 130/82   Pulse 92   Temp 97.6 F (36.4 C)   Resp 16   SpO2 96%  Wt Readings from Last 3 Encounters:  06/04/21 183 lb 10.3 oz (83.3 kg)  05/08/21 173 lb (78.5 kg)  03/28/21 173 lb (78.5 kg)     Lab Results  Component Value Date   WBC 6.0 09/03/2021   HGB 13.5 09/03/2021   HCT 41.6 09/03/2021   PLT 75.0 (L) 09/03/2021   GLUCOSE 116 (H) 09/03/2021   CHOL 109 09/03/2021   TRIG 95.0 09/03/2021   HDL 38.30 (L) 09/03/2021   LDLCALC 51 09/03/2021   ALT 9 09/03/2021   AST 14 09/03/2021   NA 141 09/03/2021   K 4.3 09/03/2021   CL 104 09/03/2021   CREATININE 1.04 09/03/2021   BUN 24 (H) 09/03/2021   CO2 30 09/03/2021   TSH 1.60 09/03/2021   PSA 1.70 09/11/2020   INR 2.2 08/28/2021   HGBA1C 6.2 09/03/2021    DG Chest Port 1 View  Result Date: 05/31/2021 CLINICAL DATA:  87 year old male with weakness, atrial fibrillation. EXAM: PORTABLE CHEST 1 VIEW COMPARISON:  Chest radiograph 05/11/2019 and earlier. FINDINGS: Portable AP semi upright view at 1006 hours. Stable cardiomegaly and mediastinal contours. Previous sternotomy and valve replacement. Small left pleural effusion versus chronic pleural scarring, not significantly changed from 2020. No pneumothorax or pulmonary edema. Right lung appears clear. Visualized tracheal air column is within normal limits. No acute osseous abnormality identified. IMPRESSION: Cardiomegaly with small left pleural versus chronic pleural scarring. Electronically Signed   By: Genevie Ann M.D.   On: 05/31/2021 10:49     Assessment & Plan:  Plan    No  orders of the defined types were placed in this encounter.   Problem List Items Addressed This Visit     HYPERTENSION, BENIGN ESSENTIAL - Primary    Well controlled, no changes to meds. Encouraged heart healthy diet such as the DASH diet and exercise as tolerated.       Relevant Orders   Comprehensive metabolic panel (Completed)   Lipid panel (Completed)   TSH (Completed)   Lower urinary tract infectious disease    Asymptomatic at present time.       Anemia   Relevant Orders   CBC (Completed)   Hyperglycemia    hgba1c acceptable, minimize simple carbs. Increase exercise as tolerated.       Relevant Orders   Hemoglobin A1c (Completed)   Lipid panel (Completed)   VITAMIN D 25 Hydroxy (Vit-D Deficiency, Fractures) (Completed)   Pressure injury of skin    Left heal, it is small and he has an appt later today with his wound management doctor Dr Zigmund Gottron to discuss management options. He is reminded to change the way he rests his heals throughout the day and to increase his protein intake, take a multivitamin with minerals and fish oil cap daily      Other Visit Diagnoses     Hypocalcemia           Follow-up: Return in about 4 months (around 01/04/2022) for f/u visit, then annual cpe around Jun 2023.  I, Suezanne Jacquet, acting as a scribe for Penni Homans, MD, have documented all relevent documentation on behalf of Penni Homans, MD, as directed by Penni Homans, MD while in the presence of Penni Homans, MD. DO:09/04/21.  I, [name], MD personally performed the services described in this documentation. All medical record entries made by the scribe were at my direction and in my presence. I have reviewed the chart and agree that the record reflects my personal performance and is accurate and complete

## 2021-09-03 NOTE — Patient Instructions (Addendum)
Check for selenium in Multivitamin with minerals Fatty acid supplement such as fish or krill or flaxseed oil Protein, Protein  Molnupiravir/Paxlovid is the new COVID medication we can give you if you get COVID so make sure you test if you have symptoms because we have to treat by day 5 of symptoms for it to be effective. If you are positive let us know so we can treat. If a home test is negative and your symptoms are persistent get a PCR test. Can check testing locations at [email or web address] If you are positive we will make an appointment with [place] and we will send in molnupiravir/paxlovid if you would like it. Check with your pharmacy before we meet to confirm they have it in stock, if they do not then we can get the prescription at the [facility].   Bivalent covid shot available downstairs with walk-ins at [facility] Mon-Fri, 9am-3pm or any pharmacy. Preventing Pressure Injuries A pressure injury, sometimes called a bedsore or a pressure ulcer, is an injury to the skin and underlying tissue caused by pressure. A pressure injury can happen when your skin presses against a surface, such as a mattress or wheelchair seat, for too long. The pressure on the blood vessels causes reduced blood flow to your skin. This can eventually cause the skin tissue to die and break down into a wound. Pressure injuries usually develop: Over bony parts of the body, such as the tailbone, shoulders, elbows, hips, and heels. Under medical devices, such as respiratory equipment, stockings, tubes, and splints. How can this condition affect me? Pressure injuries are caused by a lack of blood supply to an area of skin. These injuries begin as a reddened area on the skin and can become an open sore. They can result from intense pressure over a short period of time or from less pressure over a long period of time. Pressure injuries can vary in severity. They can cause pain, muscle damage, and  infection. What can increase my risk? This condition is more likely to develop in people who: Are in the hospital or an extended care facility. Are bedridden or in a wheelchair. Have an injury or disease that keeps them from: Moving normally. Feeling pain or pressure. Communicating if they feel pain or pressure. Have a condition that: Makes them sleepy or less alert. Causes poor blood flow. Need to wear a medical device. Have poor control of their bladder or bowel functions (incontinence). Have poor nutrition (malnutrition). Have had this condition before. Are of certain ethnicities. People of African American, Latino, or Hispanic descent are at higher risk compared to other ethnic groups. What actions can I take to prevent pressure injuries? Reducing and redistributing pressure Do not lie or sit in one position for a long time. Move or change position: Every hour when out of bed in a chair. Every two hours when in bed. As often as told by your health care provider. Use pillows, wedges, or cushions to redistribute pressure. Ask your health care provider to recommend a mattress, cushions, or pads for you. Use medical devices that do not rub your skin. Tell your health care provider if one of your medical devices is causing pain or irritation. Skin care If you are in the hospital, your health care providers: Will inspect your skin, including areas under or around medical devices, at least twice a day. May recommend that you use certain types of bedding to help prevent pressure injuries. These may include a pad, mattress,  or chair cushion that is filled with gel, air, water, or foam. Will evaluate your nutrition and consult a dietitian if needed. Will inspect and change any wound dressings regularly. May help you move into different positions every few hours. Will adjust any medical devices and braces as needed to limit pressure on your skin. Will keep your skin clean and dry. May use  gentle cleansers and skin protectants if you are incontinent. Will moisturize any dry skin. In general, at home: Keep your skin clean and dry. Gently pat your skin dry. Do not rub or massage bony areas of your skin. Moisturize dry skin. Use gentle cleansers and skin protectants routinely if you are incontinent. Check your skin at least once a day for any changes in color and for any new blisters or sores. Make sure to check under and around any medical devices and between skin folds. Have a caregiver do this for you if you are not able.  Lifestyle Be as active as you can every day. Ask your health care provider to suggest safe exercises or activities. Do not abuse drugs or alcohol. Do not use any products that contain nicotine or tobacco, such as cigarettes, e-cigarettes, and chewing tobacco. If you need help quitting, ask your health care provider. General instructions  Take over-the-counter and prescription medicines only as told by your health care provider. Work with your health care provider to manage any chronic health conditions. Eat a healthy diet that includes protein, vitamins, and minerals. Ask your health care provider what types of food you should eat. Drink enough fluid to keep your urine pale yellow. Keep all follow-up visits as told by your health care provider. This is important. Contact a health care provider if you: Feel or see any changes in your skin. Summary A pressure injury, sometimes called a bedsore or a pressure ulcer, is an injury to the skin and underlying tissue caused by pressure. Do not lie or sit in one position for a long time. Check your skin at least once a day for any changes in color and for any new blisters or sores. Make sure to check under and around any medical devices and between skin folds. Have a caregiver do this for you if you are not able. Eat a healthy diet that includes protein, vitamins, and minerals. Ask your health care provider what  types of food you should eat. This information is not intended to replace advice given to you by your health care provider. Make sure you discuss any questions you have with your health care provider. Document Revised: 02/25/2019 Document Reviewed: 07/27/2018 Elsevier Patient Education  [place].

## 2021-09-04 NOTE — Assessment & Plan Note (Signed)
Left heal, it is small and he has an appt later today with his wound management doctor Dr Zigmund Gottron to discuss management options. He is reminded to change the way he rests his heals throughout the day and to increase his protein intake, take a multivitamin with minerals and fish oil cap daily

## 2021-09-04 NOTE — Assessment & Plan Note (Signed)
Well controlled, no changes to meds. Encouraged heart healthy diet such as the DASH diet and exercise as tolerated.  °

## 2021-09-04 NOTE — Assessment & Plan Note (Signed)
Asymptomatic at present time.

## 2021-09-04 NOTE — Assessment & Plan Note (Signed)
hgba1c acceptable, minimize simple carbs. Increase exercise as tolerated.  

## 2021-09-11 ENCOUNTER — Ambulatory Visit (INDEPENDENT_AMBULATORY_CARE_PROVIDER_SITE_OTHER): Payer: Medicare Other

## 2021-09-11 DIAGNOSIS — Z5181 Encounter for therapeutic drug level monitoring: Secondary | ICD-10-CM | POA: Diagnosis not present

## 2021-09-11 DIAGNOSIS — Z7901 Long term (current) use of anticoagulants: Secondary | ICD-10-CM

## 2021-09-11 LAB — POCT INR: INR: 2.1 (ref 2.0–3.0)

## 2021-09-11 NOTE — Patient Instructions (Signed)
Description   Spoke with Randell Patient, RN with [facility] while in home with pt instructed her to have pt continue taking 1/2 tablet daily except 1 tablet on Fridays. Recheck INR in 2 weeks.

## 2021-09-15 ENCOUNTER — Other Ambulatory Visit (HOSPITAL_COMMUNITY): Payer: Self-pay | Admitting: Internal Medicine

## 2021-09-25 ENCOUNTER — Ambulatory Visit (INDEPENDENT_AMBULATORY_CARE_PROVIDER_SITE_OTHER): Payer: Medicare Other | Admitting: *Deleted

## 2021-09-25 ENCOUNTER — Other Ambulatory Visit: Payer: Self-pay

## 2021-09-25 DIAGNOSIS — I4891 Unspecified atrial fibrillation: Secondary | ICD-10-CM

## 2021-09-25 DIAGNOSIS — Z5181 Encounter for therapeutic drug level monitoring: Secondary | ICD-10-CM | POA: Diagnosis not present

## 2021-09-25 LAB — POCT INR: INR: 1.9 — AB (ref 2.0–3.0)

## 2021-09-25 NOTE — Patient Instructions (Signed)
Description   Today take 1 tablet of Warfarin then continue taking 1/2 tablet daily except 1 tablet on Fridays. Recheck INR in 3 weeks.

## 2021-09-27 ENCOUNTER — Telehealth: Payer: Self-pay | Admitting: *Deleted

## 2021-09-27 NOTE — Telephone Encounter (Signed)
Pt left a message to call him back regarding the message he left that he would be having oral surgery next week. Returned call to the pt and he states he needs to know how many days to hold his warfarin. Advised that the oral surgeon would make the recommendation of how long he needs to hold the warfarin or where he/she would like the INR to perform the procedure. Advised that they can fax a clearance form over & explained to him what they normally put on there for his information and that it is for the Cardiologist to approve or discuss with Surgeon for any further details. He states he will be going on Monday and gave him the main fax number for the form. He was thankful for the information.

## 2021-10-14 ENCOUNTER — Other Ambulatory Visit (HOSPITAL_COMMUNITY): Payer: Self-pay | Admitting: Urology

## 2021-10-14 DIAGNOSIS — C61 Malignant neoplasm of prostate: Secondary | ICD-10-CM

## 2021-10-16 ENCOUNTER — Ambulatory Visit (INDEPENDENT_AMBULATORY_CARE_PROVIDER_SITE_OTHER): Payer: Medicare Other

## 2021-10-16 ENCOUNTER — Other Ambulatory Visit: Payer: Self-pay

## 2021-10-16 DIAGNOSIS — Z5181 Encounter for therapeutic drug level monitoring: Secondary | ICD-10-CM

## 2021-10-16 DIAGNOSIS — I4891 Unspecified atrial fibrillation: Secondary | ICD-10-CM | POA: Diagnosis not present

## 2021-10-16 LAB — POCT INR: INR: 3 (ref 2.0–3.0)

## 2021-10-16 NOTE — Patient Instructions (Signed)
Description   Continue on same dosage 1/2 tablet daily except 1 tablet on Fridays. Recheck INR in 4 weeks. [facility] [phone]

## 2021-10-23 ENCOUNTER — Encounter (HOSPITAL_COMMUNITY)
Admission: RE | Admit: 2021-10-23 | Discharge: 2021-10-23 | Disposition: A | Discharge status: Home / Self Care | Payer: Medicare Other | Source: Ambulatory Visit | Attending: Urology | Admitting: Urology

## 2021-10-23 ENCOUNTER — Other Ambulatory Visit (HOSPITAL_COMMUNITY): Payer: Medicare Other

## 2021-10-23 DIAGNOSIS — C61 Malignant neoplasm of prostate: Secondary | ICD-10-CM | POA: Diagnosis not present

## 2021-10-23 MED ORDER — GALLIUM GA 68 GOZETOTIDE 25 MCG IV KIT
4.7400 | PACK | Freq: Once | INTRAVENOUS | Status: AC
  Administered 2021-10-23: 4.74 via INTRAVENOUS

## 2021-10-25 ENCOUNTER — Ambulatory Visit (INDEPENDENT_AMBULATORY_CARE_PROVIDER_SITE_OTHER): Payer: Medicare Other

## 2021-10-25 VITALS — Ht 76.0 in | Wt 173.0 lb

## 2021-10-25 DIAGNOSIS — Z0000 Encounter for general adult medical examination without abnormal findings: Secondary | ICD-10-CM

## 2021-10-25 NOTE — Patient Instructions (Signed)
[name] [name] , Thank you for taking time to complete your Medicare Wellness Visit. I appreciate your ongoing commitment to your health goals. Please review the following plan we discussed and let me know if I can assist you in the future.   Screening recommendations/referrals: Colonoscopy: No longer required Recommended yearly ophthalmology/optometry visit for glaucoma screening and checkup Recommended yearly dental visit for hygiene and checkup  Vaccinations: Influenza vaccine: Up to date Pneumococcal vaccine: Up to date Tdap vaccine: Up to date Shingles vaccine: Discuss with pharmacy   Covid-19: Up to date  Advanced directives: Copy in chart  Conditions/risks identified: See problem list  Next appointment: Follow up in one year for your annual wellness visit.   Preventive Care 26 Years and Older, Male Preventive care refers to lifestyle choices and visits with your health care provider that can promote health and wellness. What does preventive care include? A yearly physical exam. This is also called an annual well check. Dental exams once or twice a year. Routine eye exams. Ask your health care provider how often you should have your eyes checked. Personal lifestyle choices, including: Daily care of your teeth and gums. Regular physical activity. Eating a healthy diet. Avoiding tobacco and drug use. Limiting alcohol use. Practicing safe sex. Taking low doses of aspirin every day. Taking vitamin and mineral supplements as recommended by your health care provider. What happens during an annual well check? The services and screenings done by your health care provider during your annual well check will depend on your age, overall health, lifestyle risk factors, and family history of disease. Counseling  Your health care provider may ask you questions about your: Alcohol use. Tobacco use. Drug use. Emotional well-being. Home and relationship well-being. Sexual  activity. Eating habits. History of falls. Memory and ability to understand (cognition). Work and work Statistician. Screening  You may have the following tests or measurements: Height, weight, and BMI. Blood pressure. Lipid and cholesterol levels. These may be checked every 5 years, or more frequently if you are over 52 years old. Skin check. Lung cancer screening. You may have this screening every year starting at age 42 if you have a 30-pack-year history of smoking and currently smoke or have quit within the past 15 years. Fecal occult blood test (FOBT) of the stool. You may have this test every year starting at age 70. Flexible sigmoidoscopy or colonoscopy. You may have a sigmoidoscopy every 5 years or a colonoscopy every 10 years starting at age 71. Prostate cancer screening. Recommendations will vary depending on your family history and other risks. Hepatitis C blood test. Hepatitis B blood test. Sexually transmitted disease (STD) testing. Diabetes screening. This is done by checking your blood sugar (glucose) after you have not eaten for a while (fasting). You may have this done every 1-3 years. Abdominal aortic aneurysm (AAA) screening. You may need this if you are a current or former smoker. Osteoporosis. You may be screened starting at age 71 if you are at high risk. Talk with your health care provider about your test results, treatment options, and if necessary, the need for more tests. Vaccines  Your health care provider may recommend certain vaccines, such as: Influenza vaccine. This is recommended every year. Tetanus, diphtheria, and acellular pertussis (Tdap, Td) vaccine. You may need a Td booster every 10 years. Zoster vaccine. You may need this after age 27. Pneumococcal 13-valent conjugate (PCV13) vaccine. One dose is recommended after age 75. Pneumococcal polysaccharide (PPSV23) vaccine. One dose is recommended  after age 33. Talk to your health care provider about which  screenings and vaccines you need and how often you need them. This information is not intended to replace advice given to you by your health care provider. Make sure you discuss any questions you have with your health care provider. Document Released: 11/30/2015 Document Revised: 07/23/2016 Document Reviewed: 09/04/2015 Elsevier Interactive Patient Education  2017 [employer] Prevention in the Home Falls can cause injuries. They can happen to people of all ages. There are many things you can do to make your home safe and to help prevent falls. What can I do on the outside of my home? Regularly fix the edges of walkways and driveways and fix any cracks. Remove anything that might make you trip as you walk through a door, such as a raised step or threshold. Trim any bushes or trees on the path to your home. Use bright outdoor lighting. Clear any walking paths of anything that might make someone trip, such as rocks or tools. Regularly check to see if handrails are loose or broken. Make sure that both sides of any steps have handrails. Any raised decks and porches should have guardrails on the edges. Have any leaves, snow, or ice cleared regularly. Use sand or salt on walking paths during winter. Clean up any spills in your garage right away. This includes oil or grease spills. What can I do in the bathroom? Use night lights. Install grab bars by the toilet and in the tub and shower. Do not use towel bars as grab bars. Use non-skid mats or decals in the tub or shower. If you need to sit down in the shower, use a plastic, non-slip stool. Keep the floor dry. Clean up any water that spills on the floor as soon as it happens. Remove soap buildup in the tub or shower regularly. Attach bath mats securely with double-sided non-slip rug tape. Do not have throw rugs and other things on the floor that can make you trip. What can I do in the bedroom? Use night lights. Make sure that you have a  light by your bed that is easy to reach. Do not use any sheets or blankets that are too big for your bed. They should not hang down onto the floor. Have a firm chair that has side arms. You can use this for support while you get dressed. Do not have throw rugs and other things on the floor that can make you trip. What can I do in the kitchen? Clean up any spills right away. Avoid walking on wet floors. Keep items that you use a lot in easy-to-reach places. If you need to reach something above you, use a strong step stool that has a grab bar. Keep electrical cords out of the way. Do not use floor polish or wax that makes floors slippery. If you must use wax, use non-skid floor wax. Do not have throw rugs and other things on the floor that can make you trip. What can I do with my stairs? Do not leave any items on the stairs. Make sure that there are handrails on both sides of the stairs and use them. Fix handrails that are broken or loose. Make sure that handrails are as long as the stairways. Check any carpeting to make sure that it is firmly attached to the stairs. Fix any carpet that is loose or worn. Avoid having throw rugs at the top or bottom of the stairs. If you  do have throw rugs, attach them to the floor with carpet tape. Make sure that you have a light switch at the top of the stairs and the bottom of the stairs. If you do not have them, ask someone to add them for you. What else can I do to help prevent falls? Wear shoes that: Do not have high heels. Have rubber bottoms. Are comfortable and fit you well. Are closed at the toe. Do not wear sandals. If you use a stepladder: Make sure that it is fully opened. Do not climb a closed stepladder. Make sure that both sides of the stepladder are locked into place. Ask someone to hold it for you, if possible. Clearly mark and make sure that you can see: Any grab bars or handrails. First and last steps. Where the edge of each step  is. Use tools that help you move around (mobility aids) if they are needed. These include: Canes. Walkers. Scooters. Crutches. Turn on the lights when you go into a dark area. Replace any light bulbs as soon as they burn out. Set up your furniture so you have a clear path. Avoid moving your furniture around. If any of your floors are uneven, fix them. If there are any pets around you, be aware of where they are. Review your medicines with your doctor. Some medicines can make you feel dizzy. This can increase your chance of falling. Ask your doctor what other things that you can do to help prevent falls. This information is not intended to replace advice given to you by your health care provider. Make sure you discuss any questions you have with your health care provider. Document Released: 08/30/2009 Document Revised: 04/10/2016 Document Reviewed: 12/08/2014 Elsevier Interactive Patient Education  2017 [employer].

## 2021-10-25 NOTE — Progress Notes (Signed)
Subjective:   [name] [name] is a 87 y.o. male who presents for Medicare Annual/Subsequent preventive examination.  I connected with [name] [name] today by telephone and verified that I am speaking with the correct person using two identifiers. Location patient: home Location provider: work Persons participating in the virtual visit: patient, Marine scientist.    I discussed the limitations, risks, security and privacy concerns of performing an evaluation and management service by telephone and the availability of in person appointments. I also discussed with the patient that there may be a patient responsible charge related to this service. The patient expressed understanding and verbally consented to this telephonic visit.    Interactive audio and video telecommunications were attempted between this provider and patient, however failed, due to patient having technical difficulties OR patient did not have access to video capability.  We continued and completed visit with audio only.  Some vital signs may be absent or patient reported.   Time Spent with patient on telephone encounter: 25 minutes   Review of Systems     Cardiac Risk Factors include: advanced age (>26men, >8 women);male gender;hypertension;dyslipidemia     Objective:    Today's Vitals   10/25/21 1502  Weight: 173 lb (78.5 kg)  Height: 6\' 4"  (1.93 m)  PainSc: 7    Body mass index is 21.06 kg/m.  Advanced Directives 10/25/2021 05/31/2021 05/31/2021 04/29/2021 03/08/2021 12/16/2020 10/29/2020  Does Patient Have a Medical Advance Directive? Yes Yes No Yes Yes Yes Yes  Type of Advance Directive Living will [place];Living will - Living will - - [place];Living will  Does patient want to make changes to medical advance directive? - No - Patient declined - No - Patient declined - - No - Patient declined  Copy of [place] in Chart? - No - copy requested - - - - No - copy requested   Would patient like information on creating a medical advance directive? - No - Patient declined No - Patient declined - - - No - Patient declined  Pre-existing out of facility DNR order (yellow form or pink MOST form) - - - - - - -    Current Medications (verified) Outpatient Encounter Medications as of 10/25/2021  Medication Sig   acetaminophen (TYLENOL) 500 MG tablet Take 1,000 mg by mouth every 6 (six) hours as needed for mild pain.   amiodarone (PACERONE) 200 MG tablet TAKE ONE-HALF TABLET BY  MOUTH DAILY   b complex vitamins tablet Take 1 tablet by mouth daily.   cycloSPORINE (RESTASIS) 0.05 % ophthalmic emulsion Place 1 drop into both eyes 2 (two) times daily.   fluticasone (FLONASE) 50 MCG/ACT nasal spray Place 2 sprays into both nostrils daily.    gabapentin (NEURONTIN) 300 MG capsule TAKE 1 CAPSULE BY MOUTH  TWICE DAILY (Patient taking differently: Take 300 mg by mouth 2 (two) times daily.)   Multiple Vitamin (MULTIVITAMIN WITH MINERALS) TABS tablet Take 1 tablet by mouth every evening.    potassium chloride SA (KLOR-CON) 20 MEQ tablet TAKE 2 TABLETS BY MOUTH  DAILY   rosuvastatin (CRESTOR) 5 MG tablet TAKE 1 TABLET BY MOUTH  DAILY (Patient taking differently: Take 5 mg by mouth daily.)   torsemide (DEMADEX) 20 MG tablet Take 1 tablet (20 mg total) by mouth daily.   traZODone (DESYREL) 100 MG tablet Take 200 mg by mouth at bedtime.   warfarin (COUMADIN) 5 MG tablet Take 0.5-1 tablets (2.5-5 mg total) by mouth See admin  instructions. Taking 2.5 mg on all days except 5 mg on Friday.   [DISCONTINUED] pantoprazole (PROTONIX) 40 MG tablet Take 1 tablet (40 mg total) by mouth daily before breakfast.   No facility-administered encounter medications on file as of 10/25/2021.    Allergies (verified) Patient has no known allergies.   History: Past Medical History:  Diagnosis Date   Abnormality of gait 05/27/2016   Adenomatous polyps    Carpal tunnel syndrome 06/25/2016   Right    Depressive disorder, not elsewhere classified    First degree AV block    Holter 3/18: NSR, PACs, PVCs, no AFib, no pauses.   Hereditary and idiopathic peripheral neuropathy 06/25/2016   Hypertension    Internal nasal lesion 05/15/2013   Melanoma ([place])    Left Shoulder   Mitral regurgitation    MVP (mitral valve prolapse)    a. With severe MR s/p Complex valvuloplasty including artificial Gore-tex neochord placement x4, chordal transposition x1, chordal release x1, # 32 mm Sorin Memo 3D Ring Annuloplasty 2012. // b. Echo 2/18: mild LVH, EF 50-55, mild AI, MV repair with mild MR, mod LAE, mod RVE, severe RAE, severe TR   Neuropathy    Normal coronary arteries    a. Normal coronary anatomy by cath 2012.   Osteoarthritis    Knees   PAF (paroxysmal atrial fibrillation) ([place])    a. Post-op MVR 2012.   Personal history of colonic polyps    Prostate cancer ([place])    Pulmonary HTN ([place])    a. Mild-mod by cath 2012.   Pure hypercholesterolemia    PVC (premature ventricular contraction)    Thrombocytopenia ([facility])    Vision abnormalities    Cornea scarring   Past Surgical History:  Procedure Laterality Date   CARDIAC CATHETERIZATION  09/2011   Pre-op for MVR -- normal coronaries.   CARDIOVERSION N/A 01/02/2016   Procedure: CARDIOVERSION;  Surgeon: Thayer Headings, MD;  Location: [facility];  Service: Cardiovascular;  Laterality: N/A;   COLONOSCOPY W/ POLYPECTOMY     INGUINAL HERNIA REPAIR  09/2009   Left   KNEE ARTHROSCOPY      left x3  and right x2   Melanoma Surgery     2001, 2005, 2006, 2009   MITRAL VALVE REPAIR  10/01/2011   complex valvuloplasty with Goretex cord replacement and chordal transposition 68mm Sorin Memo 3D ring annuloplasty   Nuclear Stress Test  09/2006   EF-64%, Normal   PROSTATECTOMY  1993   RIGHT HEART CATH N/A 04/29/2017   Procedure: Right Heart Cath;  Surgeon: Jolaine Artist, MD;  Location: [facility];  Service: Cardiovascular;  Laterality: N/A;    ROOT CANAL  08-19-12   ROTATOR CUFF REPAIR  2003   left   TEE WITHOUT CARDIOVERSION  09/26/2011   Procedure: TRANSESOPHAGEAL ECHOCARDIOGRAM (TEE);  Surgeon: Lelon Perla, MD;  Location: [facility] ENDOSCOPY;  Service: Cardiovascular;  Laterality: N/A;   US ECHOCARDIOGRAPHY  09/2009, 08/1011   mild LVH,mild AI,MVP with mild MR, mild-mod. TR with mild Pulm. HTN, EF-55-60%   Family History  Problem Relation Age of Onset   Clotting disorder Brother        CVA's   Arthritis Mother    Hypertension Mother    Stroke Mother    Hypertension Father    Psychosis Father        psychiatric care   Colon cancer Neg Hx    Stomach cancer Neg Hx    Heart attack Neg Hx  Prostate cancer Neg Hx    Pancreatic cancer Neg Hx    Social History   Socioeconomic History   Marital status: Widowed    Spouse name: Not on file   Number of children: 2   Years of education: 42   Highest education level: Not on file  Occupational History    Comment: retired  Tobacco Use   Smoking status: Never   Smokeless tobacco: Never  Vaping Use   Vaping Use: Never used  Substance and Sexual Activity   Alcohol use: No    Alcohol/week: 0.0 standard drinks    Comment: Last drink in 2000   Drug use: No   Sexual activity: Not Currently  Other Topics Concern   Not on file  Social History Narrative   Retired - Optometrist   Widower   2 children (one in [place] and on one in [place])    Drinks 1 cup of coffee per day   Social Determinants of Radio broadcast assistant Strain: Low Risk    Difficulty of Paying Living Expenses: Not hard at all  Food Insecurity: No Food Insecurity   Worried About Charity fundraiser in the Last Year: Never true   Arboriculturist in the Last Year: Never true  Transportation Needs: No Transportation Needs   Lack of Transportation (Medical): No   Lack of Transportation (Non-Medical): No  Physical Activity: Sufficiently Active   Days of Exercise per Week: 7 days   Minutes of Exercise  per Session: 30 min  Stress: No Stress Concern Present   Feeling of Stress : Not at all  Social Connections: Moderately Isolated   Frequency of Communication with Friends and Family: More than three times a week   Frequency of Social Gatherings with Friends and Family: More than three times a week   Attends Religious Services: 1 to 4 times per year   Active Member of [employer] or Organizations: No   Attends Archivist Meetings: Never   Marital Status: Widowed    Tobacco Counseling Counseling given: Not Answered   Clinical Intake:  Pre-visit preparation completed: Yes  Pain : 0-10 Pain Score: 7  Pain Type: Chronic pain Pain Location: Knee Pain Onset: More than a month ago Pain Frequency: Constant     BMI - recorded: 21.06 Nutritional Status: BMI of 19-24  Normal Nutritional Risks: Unintentional weight loss Diabetes: No  How often do you need to have someone help you when you read instructions, pamphlets, or other written materials from your doctor or pharmacy?: 1 - Never  Diabetic?No  Interpreter Needed?: No  Information entered by :: Caroleen Hamman LPN   Activities of Daily Living In your present state of health, do you have any difficulty performing the following activities: 10/25/2021 05/31/2021  Hearing? Y Y  Comment hearing loss -  Vision? N N  Difficulty concentrating or making decisions? N Y  Comment - some memory problems  Walking or climbing stairs? Y Y  Dressing or bathing? Y Y  Doing errands, shopping? Tempie Donning  Preparing Food and eating ? Y -  Comment preparing meals -  Using the Toilet? N -  In the past six months, have you accidently leaked urine? N -  Do you have problems with loss of bowel control? N -  Managing your Medications? N -  Managing your Finances? N -  Housekeeping or managing your Housekeeping? Y -  Some recent data might be hidden    Patient Care  Team: [name], MD as PCP - General (Family Medicine) Bensimhon, Shaune Pascal, MD as PCP - Advanced Heart Failure (Cardiology) [name], MD as PCP - Cardiology (Cardiology) Darlin Coco, MD as Referring Physician (Cardiology) Willodean Rosenthal, MD as Consulting Physician (Internal Medicine) Dillingham, Loel Lofty, DO as Consulting Physician (Plastic Surgery) Magnus Sinning, MD as Consulting Physician [name], MD as Consulting Physician (Cardiology) Bensimhon, Shaune Pascal, MD as Consulting Physician (Cardiology) Danella Sensing, MD as Consulting Physician (Dermatology) Eppie Gibson as Physician Assistant (Physician Assistant) Ceasar Mons, MD as Consulting Physician (Urology) Garald Balding, MD as Consulting Physician (Orthopedic Surgery) Cira Rue, RN Nurse Navigator as Registered Nurse (Medical Oncology)  Indicate any recent Medical Services you may have received from other than [facility] providers in the past year (date may be approximate).     Assessment:   This is a routine wellness examination for [place].  Hearing/Vision screen Hearing Screening - Comments:: Bilateral hearing aids Vision Screening - Comments:: Last eye exam- 3 months ago-Dr. Katy Fitch  Dietary issues and exercise activities discussed: Current Exercise Habits: Home exercise routine, Type of exercise: stretching;strength training/weights, Time (Minutes): 30, Frequency (Times/Week): 7, Weekly Exercise (Minutes/Week): 210, Exercise limited by: orthopedic condition(s);Other - see comments (impaired mobilty)   Goals Addressed             This Visit's Progress    Continue to eat a well balanced diet.   On track    Patient Stated       Maintain or improve current health       Depression Screen PHQ 2/9 Scores 10/25/2021 05/14/2021 05/24/2020 05/23/2019 05/18/2018 09/22/2017 01/11/2015  PHQ - 2 Score 0 0 0 0 0 0 0    Fall Risk Fall Risk  10/25/2021 05/14/2021 05/24/2020 05/23/2019 05/18/2018  Falls in the past year? 1 1 0 0 Yes  Number falls in past yr: 1 1  0 - 2 or more  Injury with Fall? 1 1 0 - No  Risk for fall due to : History of fall(s);Impaired balance/gait;Impaired mobility - - - -  Risk for fall due to: Comment - - - - -  Follow up Falls prevention discussed - Education provided;Falls prevention discussed - Education provided;Falls prevention discussed    FALL RISK PREVENTION PERTAINING TO THE HOME:  Any stairs in or around the home? Yes chair lift If so, are there any without handrails? No  Home free of loose throw rugs in walkways, pet beds, electrical cords, etc? Yes  Adequate lighting in your home to reduce risk of falls? Yes   ASSISTIVE DEVICES UTILIZED TO PREVENT FALLS:  Life alert? Yes  Use of a cane, walker or w/c? Yes  Grab bars in the bathroom? Yes  Shower chair or bench in shower? Yes  Elevated toilet seat or a handicapped toilet? Yes   TIMED UP AND GO:  Was the test performed? No . Phone visit   Cognitive Function:Normal cognitive status assessed by this Nurse Health Advisor. No abnormalities found.   MMSE - Mini Mental State Exam 05/18/2018  Not completed: Refused        Immunizations Immunization History  Administered Date(s) Administered   Fluad Quad(high Dose 65+) 08/26/2019, 08/14/2020   Influenza Split 08/04/2012   Influenza Whole 08/15/2008   Influenza, High Dose Seasonal PF 08/05/2018, 08/26/2019, 08/23/2021   Influenza,inj,Quad PF,6+ Mos 08/18/2016   Influenza-Unspecified 08/17/2013, 10/10/2014, 08/18/2015, 08/17/2017, 08/26/2019, 08/14/2020   PFIZER(Purple Top)SARS-COV-2 Vaccination 12/08/2019, 12/29/2019, 04/23/2021   Pfizer  Covid-19 Vaccine Bivalent Booster 33yrs & up 08/17/2021   Pneumococcal Conjugate-13 10/10/2014   Pneumococcal Polysaccharide-23 09/29/2017   Tdap 05/14/2021   Zoster, Live 08/17/2013    TDAP status: Up to date  Flu Vaccine status: Up to date  Pneumococcal vaccine status: Up to date  Covid-19 vaccine status: Completed vaccines  Qualifies for Shingles Vaccine?  Yes   Zostavax completed Yes   Shingrix Completed?: No.    Education has been provided regarding the importance of this vaccine. Patient has been advised to call insurance company to determine out of pocket expense if they have not yet received this vaccine. Advised may also receive vaccine at local pharmacy or Health Dept. Verbalized acceptance and understanding.  Screening Tests Health Maintenance  Topic Date Due   Zoster Vaccines- Shingrix (1 of 2) 08/14/2022 (Originally 03/30/1950)   TETANUS/TDAP  05/15/2031   Pneumonia Vaccine 57+ Years old  Completed   INFLUENZA VACCINE  Completed   COVID-19 Vaccine  Completed   HPV VACCINES  Aged Out   COLONOSCOPY (Pts 45-110yrs Insurance coverage will need to be confirmed)  Discontinued    Health Maintenance  There are no preventive care reminders to display for this patient.   Colorectal cancer screening: No longer required.   Lung Cancer Screening: (Low Dose CT Chest recommended if Age 26-80 years, 30 pack-year currently smoking OR have quit w/in 15years.) does not qualify.    Additional Screening:  Hepatitis C Screening: does not qualify  Vision Screening: Recommended annual ophthalmology exams for early detection of glaucoma and other disorders of the eye. Is the patient up to date with their annual eye exam?  Yes  Who is the provider or what is the name of the office in which the patient attends annual eye exams? Dr. Katy Fitch   Dental Screening: Recommended annual dental exams for proper oral hygiene  Community Resource Referral / Chronic Care Management: CRR required this visit?  No   CCM required this visit?  No      Plan:     I have personally reviewed and noted the following in the patient's chart:   Medical and social history Use of alcohol, tobacco or illicit drugs  Current medications and supplements including opioid prescriptions. Patient is not currently taking opioid prescriptions. Functional ability and  status Nutritional status Physical activity Advanced directives List of other physicians Hospitalizations, surgeries, and ER visits in previous 12 months Vitals Screenings to include cognitive, depression, and falls Referrals and appointments  In addition, I have reviewed and discussed with patient certain preventive protocols, quality metrics, and best practice recommendations. A written personalized care plan for preventive services as well as general preventive health recommendations were provided to patient.   Due to this being a telephonic visit, the after visit summary with patients personalized plan was offered to patient via mail or my-chart. Per request, patient was mailed a copy of [place], LPN   11/23/5535  Nurse Health Advisor  Nurse Notes: None

## 2021-10-31 ENCOUNTER — Inpatient Hospital Stay (HOSPITAL_BASED_OUTPATIENT_CLINIC_OR_DEPARTMENT_OTHER): Payer: Medicare Other | Admitting: Hematology & Oncology

## 2021-10-31 ENCOUNTER — Inpatient Hospital Stay: Payer: Medicare Other | Attending: Hematology & Oncology

## 2021-10-31 ENCOUNTER — Other Ambulatory Visit: Payer: Self-pay

## 2021-10-31 ENCOUNTER — Encounter: Payer: Self-pay | Admitting: Hematology & Oncology

## 2021-10-31 VITALS — BP 104/62 | HR 75 | Temp 98.2°F | Resp 18

## 2021-10-31 DIAGNOSIS — Z7901 Long term (current) use of anticoagulants: Secondary | ICD-10-CM | POA: Diagnosis not present

## 2021-10-31 DIAGNOSIS — C61 Malignant neoplasm of prostate: Secondary | ICD-10-CM | POA: Diagnosis present

## 2021-10-31 DIAGNOSIS — I4891 Unspecified atrial fibrillation: Secondary | ICD-10-CM | POA: Insufficient documentation

## 2021-10-31 DIAGNOSIS — D696 Thrombocytopenia, unspecified: Secondary | ICD-10-CM | POA: Insufficient documentation

## 2021-10-31 DIAGNOSIS — Z79899 Other long term (current) drug therapy: Secondary | ICD-10-CM | POA: Diagnosis not present

## 2021-10-31 LAB — LACTATE DEHYDROGENASE: LDH: 193 U/L — ABNORMAL HIGH (ref 98–192)

## 2021-10-31 LAB — CBC WITH DIFFERENTIAL (CANCER CENTER ONLY)
Abs Immature Granulocytes: 0.06 10*3/uL (ref 0.00–0.07)
Basophils Absolute: 0 10*3/uL (ref 0.0–0.1)
Basophils Relative: 0 %
Eosinophils Absolute: 0 10*3/uL (ref 0.0–0.5)
Eosinophils Relative: 0 %
HCT: 43.4 % (ref 39.0–52.0)
Hemoglobin: 13.6 g/dL (ref 13.0–17.0)
Immature Granulocytes: 1 %
Lymphocytes Relative: 16 %
Lymphs Abs: 1.2 10*3/uL (ref 0.7–4.0)
MCH: 29.8 pg (ref 26.0–34.0)
MCHC: 31.3 g/dL (ref 30.0–36.0)
MCV: 95.2 fL (ref 80.0–100.0)
Monocytes Absolute: 1.5 10*3/uL — ABNORMAL HIGH (ref 0.1–1.0)
Monocytes Relative: 19 %
Neutro Abs: 4.8 10*3/uL (ref 1.7–7.7)
Neutrophils Relative %: 64 %
Platelet Count: 72 10*3/uL — ABNORMAL LOW (ref 150–400)
RBC: 4.56 MIL/uL (ref 4.22–5.81)
RDW: 14.5 % (ref 11.5–15.5)
WBC Count: 7.6 10*3/uL (ref 4.0–10.5)
nRBC: 0 % (ref 0.0–0.2)

## 2021-10-31 LAB — CMP (CANCER CENTER ONLY)
ALT: 10 U/L (ref 0–44)
AST: 13 U/L — ABNORMAL LOW (ref 15–41)
Albumin: 4 g/dL (ref 3.5–5.0)
Alkaline Phosphatase: 39 U/L (ref 38–126)
Anion gap: 4 — ABNORMAL LOW (ref 5–15)
BUN: 27 mg/dL — ABNORMAL HIGH (ref 8–23)
CO2: 34 mmol/L — ABNORMAL HIGH (ref 22–32)
Calcium: 10.5 mg/dL — ABNORMAL HIGH (ref 8.9–10.3)
Chloride: 104 mmol/L (ref 98–111)
Creatinine: 1.13 mg/dL (ref 0.61–1.24)
GFR, Estimated: >60 mL/min (ref >60)
Glucose, Bld: 78 mg/dL (ref 70–99)
Potassium: 5.7 mmol/L — ABNORMAL HIGH (ref 3.5–5.1)
Sodium: 142 mmol/L (ref 135–145)
Total Bilirubin: 0.9 mg/dL (ref 0.3–1.2)
Total Protein: 7 g/dL (ref 6.5–8.1)

## 2021-10-31 LAB — SAVE SMEAR(SSMR), FOR PROVIDER SLIDE REVIEW

## 2021-10-31 NOTE — Progress Notes (Signed)
Hematology and Oncology Follow Up Visit  [name] [name] [id number] [birth year] 87 y.o. 10/31/2021   Principle Diagnosis:  Chronic thrombocytopenia-immune-based versus medication  Current Therapy:   Observation     Interim History:  [name] is back for follow-up.  Unfortunately, looks like the prostate cancer came back on him.  He said he has prostate taken out 29 years ago.  He said that the PSA was going up.  Apparently, there appears to be some disease in his sacrum.  Is being followed by urology.  Sounds like his been started on Lupron.  I feel bad about this.  I am surprised that the prostate cancer would  back after being in remission for so long.    He is still on Coumadin.  He is on amiodarone for the atrial fibrillation.  He has had no bleeding.  Is platelets are holding steady.  This is certainly encouraging.  He has had a decent appetite.  He has had no problems with nausea or vomiting.  There has been no problems with COVID.  Overall, I would say performance status is ECOG 2.   Medications:  Current Outpatient Medications:    acetaminophen (TYLENOL) 500 MG tablet, Take 1,000 mg by mouth every 6 (six) hours as needed for mild pain., Disp: , Rfl:    amiodarone (PACERONE) 200 MG tablet, TAKE ONE-HALF TABLET BY  MOUTH DAILY, Disp: 45 tablet, Rfl: 3   b complex vitamins tablet, Take 1 tablet by mouth daily., Disp: , Rfl:    cycloSPORINE (RESTASIS) 0.05 % ophthalmic emulsion, Place 1 drop into both eyes 2 (two) times daily., Disp: , Rfl:    fluticasone (FLONASE) 50 MCG/ACT nasal spray, Place 2 sprays into both nostrils daily. , Disp: , Rfl: 11   gabapentin (NEURONTIN) 300 MG capsule, TAKE 1 CAPSULE BY MOUTH  TWICE DAILY (Patient taking differently: Take 300 mg by mouth 2 (two) times daily.), Disp: 180 capsule, Rfl: 3   Multiple Vitamin (MULTIVITAMIN WITH MINERALS) TABS tablet, Take 1 tablet by mouth every evening. , Disp: , Rfl:    potassium chloride SA (KLOR-CON) 20 MEQ  tablet, TAKE 2 TABLETS BY MOUTH  DAILY, Disp: 180 tablet, Rfl: 3   rosuvastatin (CRESTOR) 5 MG tablet, TAKE 1 TABLET BY MOUTH  DAILY (Patient taking differently: Take 5 mg by mouth daily.), Disp: 90 tablet, Rfl: 3   torsemide (DEMADEX) 20 MG tablet, Take 1 tablet (20 mg total) by mouth daily., Disp: , Rfl:    traZODone (DESYREL) 100 MG tablet, Take 200 mg by mouth at bedtime., Disp: , Rfl:    warfarin (COUMADIN) 5 MG tablet, Take 0.5-1 tablets (2.5-5 mg total) by mouth See admin instructions. Taking 2.5 mg on all days except 5 mg on Friday., Disp: , Rfl:   Allergies: No Known Allergies  Past Medical History, Surgical history, Social history, and Family History were reviewed and updated.  Review of Systems: Review of Systems  Constitutional: Negative.   HENT:  Negative.    Eyes: Negative.   Respiratory: Negative.    Cardiovascular: Negative.   Gastrointestinal: Negative.   Endocrine: Negative.   Genitourinary: Negative.    Musculoskeletal:  Positive for arthralgias.  Skin: Negative.   Neurological: Negative.   Hematological: Negative.   Psychiatric/Behavioral: Negative.     Physical Exam:  oral temperature is 98.2 F (36.8 C). His blood pressure is 104/62 and his pulse is 75. His respiration is 18 and oxygen saturation is 98%.   Wt Readings from Last 3  Encounters:  10/25/21 173 lb (78.5 kg)  06/04/21 183 lb 10.3 oz (83.3 kg)  05/08/21 173 lb (78.5 kg)    Physical Exam Vitals reviewed.  HENT:     Head: Normocephalic and atraumatic.  Eyes:     Pupils: Pupils are equal, round, and reactive to light.  Cardiovascular:     Rate and Rhythm: Normal rate and regular rhythm.     Heart sounds: Normal heart sounds.  Pulmonary:     Effort: Pulmonary effort is normal.     Breath sounds: Normal breath sounds.  Abdominal:     General: Bowel sounds are normal.     Palpations: Abdomen is soft.  Musculoskeletal:        General: No tenderness or deformity. Normal range of motion.      Cervical back: Normal range of motion.  Lymphadenopathy:     Cervical: No cervical adenopathy.  Skin:    General: Skin is warm and dry.     Findings: No erythema or rash.  Neurological:     Mental Status: He is alert and oriented to person, place, and time.  Psychiatric:        Behavior: Behavior normal.        Thought Content: Thought content normal.        Judgment: Judgment normal.     Lab Results  Component Value Date   WBC 7.6 10/31/2021   HGB 13.6 10/31/2021   HCT 43.4 10/31/2021   MCV 95.2 10/31/2021   PLT 72 (L) 10/31/2021     Chemistry      Component Value Date/Time   NA 142 10/31/2021 1133   NA 143 02/23/2017 1103   K 5.7 (H) 10/31/2021 1133   CL 104 10/31/2021 1133   CO2 34 (H) 10/31/2021 1133   BUN 27 (H) 10/31/2021 1133   BUN 25 02/23/2017 1103   CREATININE 1.13 10/31/2021 1133   CREATININE 0.92 08/14/2020 1241      Component Value Date/Time   CALCIUM 10.5 (H) 10/31/2021 1133   ALKPHOS 39 10/31/2021 1133   AST 13 (L) 10/31/2021 1133   ALT 10 10/31/2021 1133   BILITOT 0.9 10/31/2021 1133       Impression and Plan: [name] is a 87 year old white male.  He has mild thrombocytopenia.  This is been chronic.  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  I do not see any problems with him being treated for prostate cancer.  I think this would be a totally separate problem from the thrombocytopenia.  I still think we get him back in 6 months.  He is being followed incredibly closely by Urology.  I am sure that they will be aggressive with trying to manage his prostate cancer.   Volanda Napoleon, MD 12/15/202212:34 PM

## 2021-11-27 ENCOUNTER — Encounter (INDEPENDENT_AMBULATORY_CARE_PROVIDER_SITE_OTHER): Payer: Self-pay

## 2021-12-04 ENCOUNTER — Ambulatory Visit (INDEPENDENT_AMBULATORY_CARE_PROVIDER_SITE_OTHER): Payer: Medicare Other

## 2021-12-04 ENCOUNTER — Other Ambulatory Visit: Payer: Self-pay

## 2021-12-04 DIAGNOSIS — Z5181 Encounter for therapeutic drug level monitoring: Secondary | ICD-10-CM

## 2021-12-04 DIAGNOSIS — I4891 Unspecified atrial fibrillation: Secondary | ICD-10-CM | POA: Diagnosis not present

## 2021-12-04 LAB — POCT INR: INR: 2.5 (ref 2.0–3.0)

## 2021-12-04 NOTE — Patient Instructions (Signed)
Description   Continue on same dosage 1/2 tablet daily except 1 tablet on Fridays. Recheck INR in 5 weeks. [facility] [phone]

## 2021-12-16 ENCOUNTER — Encounter (INDEPENDENT_AMBULATORY_CARE_PROVIDER_SITE_OTHER): Payer: Medicare Other | Admitting: Ophthalmology

## 2021-12-24 ENCOUNTER — Encounter (INDEPENDENT_AMBULATORY_CARE_PROVIDER_SITE_OTHER): Payer: Medicare Other | Admitting: Ophthalmology

## 2021-12-24 ENCOUNTER — Other Ambulatory Visit: Payer: Self-pay

## 2021-12-24 ENCOUNTER — Encounter (INDEPENDENT_AMBULATORY_CARE_PROVIDER_SITE_OTHER): Payer: Self-pay | Admitting: Ophthalmology

## 2021-12-24 ENCOUNTER — Ambulatory Visit (INDEPENDENT_AMBULATORY_CARE_PROVIDER_SITE_OTHER): Payer: Medicare Other | Admitting: Ophthalmology

## 2021-12-24 DIAGNOSIS — H2513 Age-related nuclear cataract, bilateral: Secondary | ICD-10-CM

## 2021-12-24 DIAGNOSIS — H353132 Nonexudative age-related macular degeneration, bilateral, intermediate dry stage: Secondary | ICD-10-CM | POA: Diagnosis not present

## 2021-12-24 DIAGNOSIS — H35372 Puckering of macula, left eye: Secondary | ICD-10-CM | POA: Diagnosis not present

## 2021-12-24 DIAGNOSIS — H3554 Dystrophies primarily involving the retinal pigment epithelium: Secondary | ICD-10-CM

## 2021-12-24 NOTE — Assessment & Plan Note (Signed)
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  Patient has review of systems largely negative for sleep apnea be as he has never been told that he snores, he does have some awakening at night for nocturia but with his disability this is not a strong review of system for positive for sleep apnea

## 2021-12-24 NOTE — Assessment & Plan Note (Addendum)
Bilateral cataract, follow-up with Dr. Clent Jacks of [facility] as scheduled  Current cataracts explain his current level of visual functioning.  Minor changes in the epiretinal region left eye and the subfoveal region of both eyes not likely to impact his visual acuity at present

## 2021-12-24 NOTE — Progress Notes (Signed)
12/24/2021     CHIEF COMPLAINT Patient presents for  Chief Complaint  Patient presents with   Retina Evaluation   Macular Degeneration      HISTORY OF PRESENT ILLNESS: [name] [name] is a 88 y.o. male who presents to the clinic today for:   HPI     Retina Evaluation           Laterality: both eyes   Associated Symptoms: Negative for Flashes, Floaters, Distortion and Blind Spot         Comments   NP- adult onset Foveomacular Vitelliform Dystrophy- referred by Dr. Clent Jacks. Pt states "I don't notice any significant changes." Pt daughter states "he never changes his glasses prescription, he is here for a retinal evaluation from Dr. Patrici Ranks findings. He mentioned a possible loss of vision."  Pt denies FOL or floaters. Pt states he started  new cancer medication 2 weeks ago, Nubeqa 300mg , 4 tablets/ day.       Last edited by Hurman Horn, MD on 12/24/2021 10:56 AM.      Referring physician: [name], MD [address] [place],  [place] [address]  HISTORICAL INFORMATION:   Selected notes from the MEDICAL RECORD NUMBER    Lab Results  Component Value Date   HGBA1C 6.2 09/03/2021     CURRENT MEDICATIONS: Current Outpatient Medications (Ophthalmic Drugs)  Medication Sig   cycloSPORINE (RESTASIS) 0.05 % ophthalmic emulsion Place 1 drop into both eyes 2 (two) times daily.   No current facility-administered medications for this visit. (Ophthalmic Drugs)   Current Outpatient Medications (Other)  Medication Sig   acetaminophen (TYLENOL) 500 MG tablet Take 1,000 mg by mouth every 6 (six) hours as needed for mild pain.   amiodarone (PACERONE) 200 MG tablet TAKE ONE-HALF TABLET BY  MOUTH DAILY   b complex vitamins tablet Take 1 tablet by mouth daily.   fluticasone (FLONASE) 50 MCG/ACT nasal spray Place 2 sprays into both nostrils daily.    gabapentin (NEURONTIN) 300 MG capsule TAKE 1 CAPSULE BY MOUTH  TWICE DAILY (Patient taking differently: Take  300 mg by mouth 2 (two) times daily.)   Multiple Vitamin (MULTIVITAMIN WITH MINERALS) TABS tablet Take 1 tablet by mouth every evening.    potassium chloride SA (KLOR-CON) 20 MEQ tablet TAKE 2 TABLETS BY MOUTH  DAILY   rosuvastatin (CRESTOR) 5 MG tablet TAKE 1 TABLET BY MOUTH  DAILY (Patient taking differently: Take 5 mg by mouth daily.)   torsemide (DEMADEX) 20 MG tablet Take 1 tablet (20 mg total) by mouth daily.   traZODone (DESYREL) 100 MG tablet Take 200 mg by mouth at bedtime.   warfarin (COUMADIN) 5 MG tablet Take 0.5-1 tablets (2.5-5 mg total) by mouth See admin instructions. Taking 2.5 mg on all days except 5 mg on Friday.   No current facility-administered medications for this visit. (Other)      REVIEW OF SYSTEMS: ROS   Negative for: Constitutional, Gastrointestinal, Neurological, Skin, Genitourinary, Musculoskeletal, HENT, Endocrine, Cardiovascular, Eyes, Respiratory, Psychiatric, Allergic/Imm, Heme/Lymph Last edited by Hurman Horn, MD on 12/24/2021 10:52 AM.       ALLERGIES No Known Allergies  PAST MEDICAL HISTORY Past Medical History:  Diagnosis Date   Abnormality of gait 05/27/2016   Adenomatous polyps    Carpal tunnel syndrome 06/25/2016   Right   Depressive disorder, not elsewhere classified    First degree AV block    Holter 3/18: NSR, PACs, PVCs, no AFib, no  pauses.   Hereditary and idiopathic peripheral neuropathy 06/25/2016   Hypertension    Internal nasal lesion 05/15/2013   Melanoma ([facility])    Left Shoulder   Mitral regurgitation    MVP (mitral valve prolapse)    a. With severe MR s/p Complex valvuloplasty including artificial Gore-tex neochord placement x4, chordal transposition x1, chordal release x1, # 32 mm Sorin Memo 3D Ring Annuloplasty 2012. // b. Echo 2/18: mild LVH, EF 50-55, mild AI, MV repair with mild MR, mod LAE, mod RVE, severe RAE, severe TR   Neuropathy    Normal coronary arteries    a. Normal coronary anatomy by cath 2012.   Osteoarthritis     Knees   PAF (paroxysmal atrial fibrillation) ([facility])    a. Post-op MVR 2012.   Personal history of colonic polyps    Prostate cancer ([facility])    Pulmonary HTN ([facility])    a. Mild-mod by cath 2012.   Pure hypercholesterolemia    PVC (premature ventricular contraction)    Thrombocytopenia ([facility])    Vision abnormalities    Cornea scarring   Past Surgical History:  Procedure Laterality Date   CARDIAC CATHETERIZATION  09/2011   Pre-op for MVR -- normal coronaries.   CARDIOVERSION N/A 01/02/2016   Procedure: CARDIOVERSION;  Surgeon: Thayer Headings, MD;  Location: [facility];  Service: Cardiovascular;  Laterality: N/A;   COLONOSCOPY W/ POLYPECTOMY     INGUINAL HERNIA REPAIR  09/2009   Left   KNEE ARTHROSCOPY      left x3  and right x2   Melanoma Surgery     2001, 2005, 2006, 2009   MITRAL VALVE REPAIR  10/01/2011   complex valvuloplasty with Goretex cord replacement and chordal transposition 53mm Sorin Memo 3D ring annuloplasty   Nuclear Stress Test  09/2006   EF-64%, Normal   PROSTATECTOMY  1993   RIGHT HEART CATH N/A 04/29/2017   Procedure: Right Heart Cath;  Surgeon: Jolaine Artist, MD;  Location: [facility];  Service: Cardiovascular;  Laterality: N/A;   ROOT CANAL  08-19-12   ROTATOR CUFF REPAIR  2003   left   TEE WITHOUT CARDIOVERSION  09/26/2011   Procedure: TRANSESOPHAGEAL ECHOCARDIOGRAM (TEE);  Surgeon: Lelon Perla, MD;  Location: [facility];  Service: Cardiovascular;  Laterality: N/A;   US ECHOCARDIOGRAPHY  09/2009, 08/1011   mild LVH,mild AI,MVP with mild MR, mild-mod. TR with mild Pulm. HTN, EF-55-60%    FAMILY HISTORY Family History  Problem Relation Age of Onset   Clotting disorder Brother        CVA's   Arthritis Mother    Hypertension Mother    Stroke Mother    Hypertension Father    Psychosis Father        psychiatric care   Colon cancer Neg Hx    Stomach cancer Neg Hx    Heart attack Neg Hx    Prostate cancer Neg Hx    Pancreatic  cancer Neg Hx     SOCIAL HISTORY Social History   Tobacco Use   Smoking status: Never   Smokeless tobacco: Never  Vaping Use   Vaping Use: Never used  Substance Use Topics   Alcohol use: No    Alcohol/week: 0.0 standard drinks    Comment: Last drink in 2000   Drug use: No         OPHTHALMIC EXAM:  Base Eye Exam     Visual Acuity (ETDRS)       Right Left  Dist cc 20/30 -2 20/30 -1    Correction: Glasses         Tonometry (Tonopen, 10:31 AM)       Right Left   Pressure 17 15         Pupils       Pupils Dark Light APD   Right PERRL 4 3 None   Left PERRL 4 3 None         Extraocular Movement       Right Left    Full Full         Neuro/Psych     Oriented x3: Yes   Mood/Affect: Normal         Dilation     Both eyes: 1.0% Mydriacyl, 2.5% Phenylephrine @ 10:31 AM           Slit Lamp and Fundus Exam     External Exam       Right Left   External Normal Normal         Slit Lamp Exam       Right Left   Lids/Lashes Normal Normal   Conjunctiva/Sclera White and quiet White and quiet   Cornea Clear Clear   Anterior Chamber Deep and quiet Deep and quiet   Iris Round and reactive Round and reactive   Lens 2.5+ Nuclear sclerosis 2.5+ Nuclear sclerosis   Anterior Vitreous Normal Normal         Fundus Exam       Right Left   Posterior Vitreous Normal Normal   Disc Normal Normal   C/D Ratio 0.45 0.5   Macula Soft drusen, Intermediate age related macular degeneration Soft drusen, Intermediate age related macular degeneration   Vessels Normal Normal   Periphery Normal Normal            IMAGING AND PROCEDURES  Imaging and Procedures for 12/24/21  OCT, Retina - OU - Both Eyes       Right Eye Quality was good. Scan locations included subfoveal. Central Foveal Thickness: 299. Progression has no prior data. Findings include abnormal foveal contour.   Left Eye Quality was good. Scan locations included subfoveal. Central  Foveal Thickness: 294. Progression has no prior data. Findings include abnormal foveal contour, epiretinal membrane.   Notes Vitelliform subfoveal type lesions OU.  No significant signs of intraretinal fluid, no subretinal fluid, no vitreal foveal macular traction in either eye distance findable  Minor epiretinal membrane nasal to FAZ OS, no distortion to the macula     Color Fundus Photography Optos - OU - Both Eyes       Right Eye Progression has no prior data. Disc findings include increased cup to disc ratio. Macula : drusen. Vessels : normal observations. Periphery : normal observations.   Left Eye Progression has no prior data. Disc findings include increased cup to disc ratio. Macula : drusen. Vessels : normal observations. Periphery : normal observations.   Notes OU, maculae with with largely subfoveal drusenoid deposit of adult vitelliform macular dystrophy appearance, no sign of wet AMD OU,              ASSESSMENT/PLAN:  Nuclear sclerotic cataract of both eyes Bilateral cataract, follow-up with Dr. Clent Jacks of [facility] as scheduled  Current cataracts explain his current level of visual functioning.  Minor changes in the epiretinal region left eye and the subfoveal region of both eyes not likely to impact his visual acuity at present  Adult onset vitelliform macular dystrophy Drusenoid  subfoveal deposits type of intermediate ARMD, dry AMD, often seen in my [place] associated with other causes of nightly hypoxic macular damage particularly sleep apnea.  Patient has review of systems largely negative for sleep apnea be as he has never been told that he snores, he does have some awakening at night for nocturia but with his disability this is not a strong review of system for positive for sleep apnea  Epiretinal membrane, left eye Minor epiretinal membrane left eye none distorting to the fovea observe     ICD-10-CM   1. Intermediate stage nonexudative  age-related macular degeneration of both eyes  H35.3132 OCT, Retina - OU - Both Eyes    Color Fundus Photography Optos - OU - Both Eyes    2. Epiretinal membrane, left eye  H35.372 OCT, Retina - OU - Both Eyes    Color Fundus Photography Optos - OU - Both Eyes    3. Adult onset vitelliform macular dystrophy  H35.54 OCT, Retina - OU - Both Eyes    Color Fundus Photography Optos - OU - Both Eyes    4. Nuclear sclerotic cataract of both eyes  H25.13       1.  Lengthy discussion the patient reviewing the findings that there is no treatable disorder relative to the age-related maculopathy at this time in either eye.  Follow-up in 6 months is recommended so as to monitor the macular condition particular with an OCT investigation or promptly should new visual symptoms develop in either eye of distorted vision  2.  I also shared with the patient that in my experience a number of patients with adult vitelliform macular dystrophy have either undiagnosed, untreated or are noncompliant with the condition of sleep apnea.  Review of systems are indeterminate for sleep apnea but with his other disabilities he could have central sleep apnea as opposed to obstructive sleep apnea and I did suggest consideration for home sleep study arranged via Dr. Willette Alma, via home sleep study simply to screen to confirm for his long-term health and ocular condition that he is not suffering nightly low oxygen (hypoxia) events from untreated or on diagnosed sleep apnea  3.  Ophthalmic Meds Ordered this visit:  No orders of the defined types were placed in this encounter.      Return in about 6 months (around 06/23/2022) for DILATE OU, OCT.  There are no Patient Instructions on file for this visit.   Explained the diagnoses, plan, and follow up with the patient and they expressed understanding.  Patient expressed understanding of the importance of proper follow up care.   Clent Demark Gustie Bobb M.D. Diseases & Surgery of  the Retina and Vitreous [facility] 12/24/21     Abbreviations: M myopia (nearsighted); A astigmatism; H hyperopia (farsighted); P presbyopia; Mrx spectacle prescription;  CTL contact lenses; OD right eye; OS left eye; OU both eyes  XT exotropia; ET esotropia; PEK punctate epithelial keratitis; PEE punctate epithelial erosions; DES dry eye syndrome; MGD meibomian gland dysfunction; ATs artificial tears; PFAT's preservative free artificial tears; [place] nuclear sclerotic cataract; PSC posterior subcapsular cataract; ERM epi-retinal membrane; PVD posterior vitreous detachment; RD retinal detachment; DM diabetes mellitus; DR diabetic retinopathy; NPDR non-proliferative diabetic retinopathy; PDR proliferative diabetic retinopathy; CSME clinically significant macular edema; DME diabetic macular edema; dbh dot blot hemorrhages; CWS cotton wool spot; POAG primary open angle glaucoma; C/D cup-to-disc ratio; HVF humphrey visual field; GVF goldmann visual field; OCT optical coherence tomography; IOP intraocular pressure; BRVO Branch  retinal vein occlusion; CRVO central retinal vein occlusion; CRAO central retinal artery occlusion; BRAO branch retinal artery occlusion; RT retinal tear; SB scleral buckle; PPV pars plana vitrectomy; VH Vitreous hemorrhage; PRP panretinal laser photocoagulation; IVK intravitreal kenalog; VMT vitreomacular traction; MH Macular hole;  NVD neovascularization of the disc; NVE neovascularization elsewhere; AREDS age related eye disease study; ARMD age related macular degeneration; POAG primary open angle glaucoma; EBMD epithelial/anterior basement membrane dystrophy; ACIOL anterior chamber intraocular lens; IOL intraocular lens; PCIOL posterior chamber intraocular lens; Phaco/IOL phacoemulsification with intraocular lens placement; [place] photorefractive keratectomy; LASIK laser assisted in situ keratomileusis; HTN hypertension; DM diabetes mellitus; COPD chronic obstructive  pulmonary disease

## 2021-12-24 NOTE — Assessment & Plan Note (Signed)
Minor epiretinal membrane left eye none distorting to the fovea observe

## 2021-12-25 ENCOUNTER — Other Ambulatory Visit: Payer: Self-pay

## 2021-12-25 ENCOUNTER — Emergency Department (HOSPITAL_COMMUNITY)
Admission: EM | Admit: 2021-12-25 | Discharge: 2021-12-25 | Disposition: A | Discharge status: Home / Self Care | Payer: Medicare Other | Attending: Emergency Medicine | Admitting: Emergency Medicine

## 2021-12-25 ENCOUNTER — Encounter (HOSPITAL_COMMUNITY): Payer: Self-pay

## 2021-12-25 ENCOUNTER — Emergency Department (HOSPITAL_COMMUNITY): Payer: Medicare Other

## 2021-12-25 DIAGNOSIS — W01198A Fall on same level from slipping, tripping and stumbling with subsequent striking against other object, initial encounter: Secondary | ICD-10-CM | POA: Diagnosis not present

## 2021-12-25 DIAGNOSIS — S0990XA Unspecified injury of head, initial encounter: Secondary | ICD-10-CM | POA: Diagnosis present

## 2021-12-25 DIAGNOSIS — Y9301 Activity, walking, marching and hiking: Secondary | ICD-10-CM | POA: Insufficient documentation

## 2021-12-25 DIAGNOSIS — R7989 Other specified abnormal findings of blood chemistry: Secondary | ICD-10-CM | POA: Diagnosis not present

## 2021-12-25 DIAGNOSIS — Z7901 Long term (current) use of anticoagulants: Secondary | ICD-10-CM | POA: Diagnosis not present

## 2021-12-25 DIAGNOSIS — R791 Abnormal coagulation profile: Secondary | ICD-10-CM | POA: Diagnosis not present

## 2021-12-25 DIAGNOSIS — W19XXXA Unspecified fall, initial encounter: Secondary | ICD-10-CM

## 2021-12-25 LAB — CBC
HCT: 44.7 % (ref 39.0–52.0)
Hemoglobin: 14 g/dL (ref 13.0–17.0)
MCH: 29.5 pg (ref 26.0–34.0)
MCHC: 31.3 g/dL (ref 30.0–36.0)
MCV: 94.3 fL (ref 80.0–100.0)
Platelets: 56 10*3/uL — ABNORMAL LOW (ref 150–400)
RBC: 4.74 MIL/uL (ref 4.22–5.81)
RDW: 14 % (ref 11.5–15.5)
WBC: 6.6 10*3/uL (ref 4.0–10.5)
nRBC: 0 % (ref 0.0–0.2)

## 2021-12-25 LAB — PROTIME-INR
INR: 4.3 (ref 0.8–1.2)
Prothrombin Time: 40.9 seconds — ABNORMAL HIGH (ref 11.4–15.2)

## 2021-12-25 LAB — BASIC METABOLIC PANEL
Anion gap: 11 (ref 5–15)
BUN: 24 mg/dL — ABNORMAL HIGH (ref 8–23)
CO2: 27 mmol/L (ref 22–32)
Calcium: 9.3 mg/dL (ref 8.9–10.3)
Chloride: 101 mmol/L (ref 98–111)
Creatinine, Ser: 1.31 mg/dL — ABNORMAL HIGH (ref 0.61–1.24)
GFR, Estimated: 52 mL/min — ABNORMAL LOW (ref >60)
Glucose, Bld: 151 mg/dL — ABNORMAL HIGH (ref 70–99)
Potassium: 4 mmol/L (ref 3.5–5.1)
Sodium: 139 mmol/L (ref 135–145)

## 2021-12-25 NOTE — Progress Notes (Signed)
°   12/25/21 1629  Clinical Encounter Type  Visited With Patient not available;Health care provider  Visit Type Initial;ED;Trauma  Referral From Nurse  Consult/Referral To Chaplain   Responded to E.D. Room 18 for Level 2 Trauma. Patient unavailable > gone to [place]. [place], M.Min. [phone].

## 2021-12-25 NOTE — ED Triage Notes (Signed)
Pt BIB [facility] from home c/o a fall. Pt was walking with his walker and it came out from under him and he fell backwards and hit his head.

## 2021-12-25 NOTE — Progress Notes (Signed)
Orthopedic Tech Progress Note Patient Details:  [name] [name] 03-Mar-1931 [id number] Level 2 Trauma  Patient ID: [name] [name], male   DOB: 06/01/1931, 88 y.o.   MRN: [id number]  Jearld Lesch 12/25/2021, 4:52 PM

## 2021-12-25 NOTE — Discharge Instructions (Addendum)
Your INR level was 4.3 today, which is too high.  I recommend that you SKIP your Coumadin dose tonight, then get back on your normal schedule tomorrow.  Tomorrow morning, please call the doctor or clinic that manages your Coumadin to talk about your level being high.  They may wish to change your dosing moving forward.  You also look very mildly dehydrated on your blood test.  Please continue drinking plenty of water at home.  Your CT scan did not show any signs of bleeding inside your skull from your head injury.

## 2021-12-25 NOTE — ED Provider Notes (Signed)
[facility] EMERGENCY DEPARTMENT Provider Note   CSN: [id number] Arrival date & time: 12/25/21  1617     History  CC: Head injury  [name] [name] is a 88 y.o. male on Coumadin for A-fib presenting to the ED with a head injury.  Patient reports that he lost his footing and fell and struck the back of his head on a counter today.  He denies lightheadedness or syncope before or after the incident.  He normally walks with a walker.  He says he has caregivers that are with him most of the day in the daytime, otherwise he lives alone.  No changes in his Coumadin dosing recently.  HPI     Home Medications Prior to Admission medications   Medication Sig Start Date End Date Taking? Authorizing Provider  acetaminophen (TYLENOL) 500 MG tablet Take 1,000 mg by mouth every 6 (six) hours as needed for mild pain.    [provider]  amiodarone (PACERONE) 200 MG tablet TAKE ONE-HALF TABLET BY  MOUTH DAILY 06/25/21   Bensimhon, Shaune Pascal, MD  b complex vitamins tablet Take 1 tablet by mouth daily.    [provider]  cycloSPORINE (RESTASIS) 0.05 % ophthalmic emulsion Place 1 drop into both eyes 2 (two) times daily.    [provider]  fluticasone (FLONASE) 50 MCG/ACT nasal spray Place 2 sprays into both nostrils daily.  04/28/18   [provider]  gabapentin (NEURONTIN) 300 MG capsule TAKE 1 CAPSULE BY MOUTH  TWICE DAILY Patient taking differently: Take 300 mg by mouth 2 (two) times daily. 01/07/21   Magnus Sinning, MD  Multiple Vitamin (MULTIVITAMIN WITH MINERALS) TABS tablet Take 1 tablet by mouth every evening.     [provider]  potassium chloride SA (KLOR-CON) 20 MEQ tablet TAKE 2 TABLETS BY MOUTH  DAILY 09/16/21   Bensimhon, Shaune Pascal, MD  rosuvastatin (CRESTOR) 5 MG tablet TAKE 1 TABLET BY MOUTH  DAILY Patient taking differently: Take 5 mg by mouth daily. 01/04/21   Bensimhon, Shaune Pascal, MD  torsemide (DEMADEX) 20 MG tablet Take 1  tablet (20 mg total) by mouth daily. 06/04/21   Antonieta Pert, MD  traZODone (DESYREL) 100 MG tablet Take 200 mg by mouth at bedtime.    [provider]  warfarin (COUMADIN) 5 MG tablet Take 0.5-1 tablets (2.5-5 mg total) by mouth See admin instructions. Taking 2.5 mg on all days except 5 mg on Friday. 06/04/21   Antonieta Pert, MD  pantoprazole (PROTONIX) 40 MG tablet Take 1 tablet (40 mg total) by mouth daily before breakfast. 10/16/11 06/29/19  Nani Skillern, PA-C      Allergies    Patient has no known allergies.    Review of Systems   Review of Systems  Physical Exam Updated Vital Signs BP (!) 158/100    Pulse 83    Temp 98.2 F (36.8 C) (Oral)    Resp 18    Ht 6\' 4"  (1.93 m)    Wt 77.1 kg    SpO2 94%    BMI 20.69 kg/m  Physical Exam Constitutional:      General: He is not in acute distress. HENT:     Head: Normocephalic and atraumatic.  Eyes:     Conjunctiva/sclera: Conjunctivae normal.     Pupils: Pupils are equal, round, and reactive to light.  Cardiovascular:     Rate and Rhythm: Normal rate. Rhythm irregular.  Pulmonary:     Effort: Pulmonary effort is normal.  No respiratory distress.  Abdominal:     General: There is no distension.     Tenderness: There is no abdominal tenderness.  Skin:    General: Skin is warm and dry.  Neurological:     General: No focal deficit present.     Mental Status: He is alert. Mental status is at baseline.  Psychiatric:        Mood and Affect: Mood normal.        Behavior: Behavior normal.    ED Results / Procedures / Treatments   Labs (all labs ordered are listed, but only abnormal results are displayed) Labs Reviewed  BASIC METABOLIC PANEL - Abnormal; Notable for the following components:      Result Value   Glucose, Bld 151 (*)    BUN 24 (*)    Creatinine, Ser 1.31 (*)    GFR, Estimated 52 (*)    All other components within normal limits  CBC - Abnormal; Notable for the following components:   Platelets 56 (*)     All other components within normal limits  PROTIME-INR - Abnormal; Notable for the following components:   Prothrombin Time 40.9 (*)    INR 4.3 (*)    All other components within normal limits    EKG None  Radiology  Procedures Procedures    Medications Ordered in ED Medications - No data to display  ED Course/ Medical Decision Making/ A&P Clinical Course as of 12/25/21 1845  Wed Dec 25, 2021  1701 IMPRESSION:: IMPRESSION: 1. Moderate cortical atrophy within normal limits for patient age. 2. No acute intracranial hemorrhage. [MT]  1752 INR(!!): 4.3 [MT]  1752 INR is mildly supratherapeutic, would advise that the patient hold his next dose of coumadin and then resume as per normal [MT]    Clinical Course User Index [MT] Amy Belloso, Carola Rhine, MD                           Medical Decision Making Amount and/or Complexity of Data Reviewed Labs: ordered. Decision-making details documented in ED Course. Radiology: ordered.   Patient is here with a mechanical fall and posterior head injury, minor for his report on his exam.  However given his age and his Coumadin use, I have ordered a level 2 CT scan of the brain.  We can check his INR level as well.  No other injuries noted on my exam.  No spinal tenderness.  Do not believe he needs emergent imaging of the cervical spine.  GCS is 15 on arrival and he is asymptomatic and feeling well.  I personally reviewed and interpreted CT scan showing no acute intracranial injury  I reviewed and interpreted his labs, notable for INR elevated to 4.3.  Labs otherwise fairly unremarkable within normal limits.  Very minor elevation of BUN and creatinine.  We discussed drinking more at home as he is not having a lot of water.  He verbalized understanding.  His daughter is coming to pick him up and take him home.        Final Clinical Impression(s) / ED Diagnoses Final diagnoses:  Fall, initial encounter  Supratherapeutic INR    Rx / DC  Orders ED Discharge Orders     None         Aften Lipsey, Carola Rhine, MD 12/25/21 [id number]

## 2021-12-26 ENCOUNTER — Ambulatory Visit (INDEPENDENT_AMBULATORY_CARE_PROVIDER_SITE_OTHER): Payer: Medicare Other

## 2021-12-26 DIAGNOSIS — I480 Paroxysmal atrial fibrillation: Secondary | ICD-10-CM | POA: Diagnosis not present

## 2021-12-26 DIAGNOSIS — Z5181 Encounter for therapeutic drug level monitoring: Secondary | ICD-10-CM | POA: Diagnosis not present

## 2021-12-26 NOTE — Patient Instructions (Signed)
Description   Spoke with pt over the phone. Instructed pt to hold today's dose and tomorrow's dose and then continue on same dosage 1/2 tablet daily except 1 tablet on Fridays. Recheck INR in 1 week. Coumadin Clinic [phone]

## 2021-12-27 ENCOUNTER — Telehealth: Payer: Self-pay | Admitting: Family Medicine

## 2021-12-27 NOTE — Telephone Encounter (Signed)
Pt stated that Dr. Junious Silk at the prostate cancer clinic advised him to inform Dr. Charlett Blake that his blood pressure has been spiking on and off.   Please advise.

## 2021-12-30 NOTE — Telephone Encounter (Signed)
Lvm to call back

## 2021-12-30 NOTE — Telephone Encounter (Signed)
Pt aware and scheduled for 01/15/22

## 2022-01-02 ENCOUNTER — Other Ambulatory Visit: Payer: Self-pay

## 2022-01-02 ENCOUNTER — Ambulatory Visit (INDEPENDENT_AMBULATORY_CARE_PROVIDER_SITE_OTHER): Payer: Medicare Other

## 2022-01-02 DIAGNOSIS — Z5181 Encounter for therapeutic drug level monitoring: Secondary | ICD-10-CM

## 2022-01-02 DIAGNOSIS — I4891 Unspecified atrial fibrillation: Secondary | ICD-10-CM | POA: Diagnosis not present

## 2022-01-02 LAB — POCT INR: INR: 2.3 (ref 2.0–3.0)

## 2022-01-02 NOTE — Patient Instructions (Signed)
Description   Start taking 1/2 tablet daily.  Recheck INR in 2 weeks. Coumadin Clinic [phone]

## 2022-01-12 ENCOUNTER — Other Ambulatory Visit: Payer: Self-pay | Admitting: Physical Medicine and Rehabilitation

## 2022-01-15 ENCOUNTER — Ambulatory Visit (INDEPENDENT_AMBULATORY_CARE_PROVIDER_SITE_OTHER): Payer: Medicare Other | Admitting: Family Medicine

## 2022-01-15 VITALS — BP 119/80 | HR 81

## 2022-01-15 DIAGNOSIS — I10 Essential (primary) hypertension: Secondary | ICD-10-CM | POA: Diagnosis not present

## 2022-01-15 NOTE — Progress Notes (Signed)
Pt here for Blood pressure check per [name], MD ? ?Pt currently takes: torsemide 20MG  ?Pt reports compliance with medication. ? ? ?BP Readings from Last 3 Encounters:  ?12/25/21 (!) 158/100  ?10/31/21 104/62  ?09/03/21 130/82  ?  ? ?BP today: 122/80 L arm, 119/80 R arm ?HR: 81 ? ?Pt brought at home BP readings: ?BP 129/79   HR 87 ?126/74   78 ?125/79   90 ?125/82   74 ?118/77   72 ?120/74   74 ? ?Next appointment: 01/23/22 with Caleen Jobs, NP ?

## 2022-01-16 ENCOUNTER — Ambulatory Visit (INDEPENDENT_AMBULATORY_CARE_PROVIDER_SITE_OTHER): Payer: Medicare Other

## 2022-01-16 ENCOUNTER — Other Ambulatory Visit: Payer: Self-pay

## 2022-01-16 DIAGNOSIS — Z5181 Encounter for therapeutic drug level monitoring: Secondary | ICD-10-CM | POA: Diagnosis not present

## 2022-01-16 DIAGNOSIS — I4891 Unspecified atrial fibrillation: Secondary | ICD-10-CM

## 2022-01-16 LAB — POCT INR: INR: 2.1 (ref 2.0–3.0)

## 2022-01-16 NOTE — Patient Instructions (Signed)
Description   ?Continue taking 1/2 tablet daily.  Recheck INR in 3 weeks. Coumadin Clinic [phone] ?  ?   ?

## 2022-01-21 ENCOUNTER — Ambulatory Visit: Payer: Medicare Other | Admitting: Family Medicine

## 2022-01-22 ENCOUNTER — Ambulatory Visit: Payer: Medicare Other | Admitting: Family Medicine

## 2022-01-23 ENCOUNTER — Encounter: Payer: Self-pay | Admitting: Family Medicine

## 2022-01-23 ENCOUNTER — Ambulatory Visit (INDEPENDENT_AMBULATORY_CARE_PROVIDER_SITE_OTHER): Payer: Medicare Other | Admitting: Family Medicine

## 2022-01-23 VITALS — BP 143/70 | HR 74

## 2022-01-23 DIAGNOSIS — L309 Dermatitis, unspecified: Secondary | ICD-10-CM

## 2022-01-23 DIAGNOSIS — I10 Essential (primary) hypertension: Secondary | ICD-10-CM

## 2022-01-23 DIAGNOSIS — R739 Hyperglycemia, unspecified: Secondary | ICD-10-CM | POA: Diagnosis not present

## 2022-01-23 DIAGNOSIS — E7800 Pure hypercholesterolemia, unspecified: Secondary | ICD-10-CM

## 2022-01-23 LAB — LIPID PANEL
Cholesterol: 103 mg/dL (ref 0–200)
HDL: 39.4 mg/dL
LDL Cholesterol: 39 mg/dL (ref 0–99)
NonHDL: 63.82
Total CHOL/HDL Ratio: 3
Triglycerides: 122 mg/dL (ref 0.0–149.0)
VLDL: 24.4 mg/dL (ref 0.0–40.0)

## 2022-01-23 LAB — CBC
HCT: 42.2 % (ref 39.0–52.0)
Hemoglobin: 14 g/dL (ref 13.0–17.0)
MCHC: 33.1 g/dL (ref 30.0–36.0)
MCV: 92.1 fl (ref 78.0–100.0)
Platelets: 55 10*3/uL — ABNORMAL LOW (ref 150.0–400.0)
RBC: 4.58 Mil/uL (ref 4.22–5.81)
RDW: 15.3 % (ref 11.5–15.5)
WBC: 6.5 10*3/uL (ref 4.0–10.5)

## 2022-01-23 LAB — COMPREHENSIVE METABOLIC PANEL WITH GFR
ALT: 11 U/L (ref 0–53)
AST: 15 U/L (ref 0–37)
Albumin: 4.1 g/dL (ref 3.5–5.2)
Alkaline Phosphatase: 42 U/L (ref 39–117)
BUN: 26 mg/dL — ABNORMAL HIGH (ref 6–23)
CO2: 32 meq/L (ref 19–32)
Calcium: 9.8 mg/dL (ref 8.4–10.5)
Chloride: 103 meq/L (ref 96–112)
Creatinine, Ser: 0.97 mg/dL (ref 0.40–1.50)
GFR: 69.55 mL/min
Glucose, Bld: 82 mg/dL (ref 70–99)
Potassium: 4.2 meq/L (ref 3.5–5.1)
Sodium: 143 meq/L (ref 135–145)
Total Bilirubin: 0.9 mg/dL (ref 0.2–1.2)
Total Protein: 6.6 g/dL (ref 6.0–8.3)

## 2022-01-23 LAB — HEMOGLOBIN A1C: Hgb A1c MFr Bld: 6.5 % (ref 4.6–6.5)

## 2022-01-23 LAB — TSH: TSH: 1.43 u[IU]/mL (ref 0.35–5.50)

## 2022-01-23 NOTE — Progress Notes (Signed)
Established Patient Office Visit  Subjective:  Patient ID: [name] [name], male    DOB: [birth year]  Age: 88 y.o. MRN: [id number]  CC:   routine follow-up   HPI [name] [name] presents for routine follow-up and dry skin concern.   Skin concerns: Patient/caregiver report that for the past several months he has had some areas on the top of both feet with dry, flaky skin.  He denies any pain, itching, drainage, rashes.  Reports that his feet are baseline slightly discolored due to circulation issues.  He has been treated for pressure sores to his ankles by wound care in the past but states that is no longer an issue at this time.    HYPERTENSION: - Medications: (toresemide amiodarone) otherwise lifestyle management  - Compliance: good  - Checking BP at home: usually 120-130s/70s - Denies any SOB, recurrent headaches, CP, vision changes, LE edema, dizziness, palpitations, or medication side effects. - Diet: balanced - Exercise: walker at home, chair exercises, resistance bands   Hyperglycemia: 08/2021: A1c = 6.2% Has not been checking CBG at home Asymptomatic.   Hx of Anemia: Stable 4 months ago, no concerns On Coumadin. Denies any signs of bleeding.   Hx of Hypokalemia: Had been taking 20 mEq KCLOR BID, but cardiologist said levels were too high and to stop. He would like to know levels now. No muscle cramps, palpitations, etc.        Past Medical History:  Diagnosis Date   Abnormality of gait 05/27/2016   Adenomatous polyps    Carpal tunnel syndrome 06/25/2016   Right   Depressive disorder, not elsewhere classified    First degree AV block    Holter 3/18: NSR, PACs, PVCs, no AFib, no pauses.   Hereditary and idiopathic peripheral neuropathy 06/25/2016   Hypertension    Internal nasal lesion 05/15/2013   Melanoma ([place])    Left Shoulder   Mitral regurgitation    MVP (mitral valve prolapse)    a. With severe MR s/p Complex valvuloplasty including artificial Gore-tex  neochord placement x4, chordal transposition x1, chordal release x1, # 32 mm Sorin Memo 3D Ring Annuloplasty 2012. // b. Echo 2/18: mild LVH, EF 50-55, mild AI, MV repair with mild MR, mod LAE, mod RVE, severe RAE, severe TR   Neuropathy    Normal coronary arteries    a. Normal coronary anatomy by cath 2012.   Osteoarthritis    Knees   PAF (paroxysmal atrial fibrillation) ([place])    a. Post-op MVR 2012.   Personal history of colonic polyps    Prostate cancer ([place])    Pulmonary HTN ([place])    a. Mild-mod by cath 2012.   Pure hypercholesterolemia    PVC (premature ventricular contraction)    Thrombocytopenia ([facility])    Vision abnormalities    Cornea scarring    Past Surgical History:  Procedure Laterality Date   CARDIAC CATHETERIZATION  09/2011   Pre-op for MVR -- normal coronaries.   CARDIOVERSION N/A 01/02/2016   Procedure: CARDIOVERSION;  Surgeon: Thayer Headings, MD;  Location: [facility];  Service: Cardiovascular;  Laterality: N/A;   COLONOSCOPY W/ POLYPECTOMY     INGUINAL HERNIA REPAIR  09/2009   Left   KNEE ARTHROSCOPY      left x3  and right x2   Melanoma Surgery     2001, 2005, 2006, 2009   MITRAL VALVE REPAIR  10/01/2011   complex valvuloplasty with Goretex cord replacement and chordal transposition 17m Sorin Memo 3D  ring annuloplasty   Nuclear Stress Test  09/2006   EF-64%, Normal   PROSTATECTOMY  1993   RIGHT HEART CATH N/A 04/29/2017   Procedure: Right Heart Cath;  Surgeon: Jolaine Artist, MD;  Location: [facility];  Service: Cardiovascular;  Laterality: N/A;   ROOT CANAL  08-19-12   ROTATOR CUFF REPAIR  2003   left   TEE WITHOUT CARDIOVERSION  09/26/2011   Procedure: TRANSESOPHAGEAL ECHOCARDIOGRAM (TEE);  Surgeon: Lelon Perla, MD;  Location: [facility] ENDOSCOPY;  Service: Cardiovascular;  Laterality: N/A;   US ECHOCARDIOGRAPHY  09/2009, 08/1011   mild LVH,mild AI,MVP with mild MR, mild-mod. TR with mild Pulm. HTN, EF-55-60%    Family History  Problem  Relation Age of Onset   Clotting disorder Brother        CVA's   Arthritis Mother    Hypertension Mother    Stroke Mother    Hypertension Father    Psychosis Father        psychiatric care   Colon cancer Neg Hx    Stomach cancer Neg Hx    Heart attack Neg Hx    Prostate cancer Neg Hx    Pancreatic cancer Neg Hx     Social History   Socioeconomic History   Marital status: Widowed    Spouse name: Not on file   Number of children: 2   Years of education: 67   Highest education level: Not on file  Occupational History    Comment: retired  Tobacco Use   Smoking status: Never   Smokeless tobacco: Never  Vaping Use   Vaping Use: Never used  Substance and Sexual Activity   Alcohol use: No    Alcohol/week: 0.0 standard drinks    Comment: Last drink in 2000   Drug use: No   Sexual activity: Not Currently  Other Topics Concern   Not on file  Social History Narrative   Retired - Optometrist   Widower   2 children (one in [place] and on one in [place])    Drinks 1 cup of coffee per day   Social Determinants of Radio broadcast assistant Strain: Low Risk    Difficulty of Paying Living Expenses: Not hard at all  Food Insecurity: No Food Insecurity   Worried About Charity fundraiser in the Last Year: Never true   Arboriculturist in the Last Year: Never true  Transportation Needs: No Transportation Needs   Lack of Transportation (Medical): No   Lack of Transportation (Non-Medical): No  Physical Activity: Sufficiently Active   Days of Exercise per Week: 7 days   Minutes of Exercise per Session: 30 min  Stress: No Stress Concern Present   Feeling of Stress : Not at all  Social Connections: Moderately Isolated   Frequency of Communication with Friends and Family: More than three times a week   Frequency of Social Gatherings with Friends and Family: More than three times a week   Attends Religious Services: 1 to 4 times per year   Active Member of [employer] or Organizations:  No   Attends Archivist Meetings: Never   Marital Status: Widowed  Human resources officer Violence: Not At Risk   Fear of Current or Ex-Partner: No   Emotionally Abused: No   Physically Abused: No   Sexually Abused: No    Outpatient Medications Prior to Visit  Medication Sig Dispense Refill   acetaminophen (TYLENOL) 500 MG tablet Take 1,000 mg by  mouth every 6 (six) hours as needed for mild pain.     amiodarone (PACERONE) 200 MG tablet TAKE ONE-HALF TABLET BY  MOUTH DAILY 45 tablet 3   b complex vitamins tablet Take 1 tablet by mouth daily.     cycloSPORINE (RESTASIS) 0.05 % ophthalmic emulsion Place 1 drop into both eyes 2 (two) times daily.     fluticasone (FLONASE) 50 MCG/ACT nasal spray Place 2 sprays into both nostrils daily.   11   gabapentin (NEURONTIN) 300 MG capsule TAKE 1 CAPSULE BY MOUTH  TWICE DAILY 180 capsule 3   Multiple Vitamin (MULTIVITAMIN WITH MINERALS) TABS tablet Take 1 tablet by mouth every evening.      rosuvastatin (CRESTOR) 5 MG tablet TAKE 1 TABLET BY MOUTH  DAILY (Patient taking differently: Take 5 mg by mouth daily.) 90 tablet 3   torsemide (DEMADEX) 20 MG tablet Take 1 tablet (20 mg total) by mouth daily.     traZODone (DESYREL) 100 MG tablet Take 200 mg by mouth at bedtime.     warfarin (COUMADIN) 5 MG tablet Take 0.5-1 tablets (2.5-5 mg total) by mouth See admin instructions. Taking 2.5 mg on all days except 5 mg on Friday.     potassium chloride SA (KLOR-CON) 20 MEQ tablet TAKE 2 TABLETS BY MOUTH  DAILY 180 tablet 3   No facility-administered medications prior to visit.    No Known Allergies  ROS Review of Systems All review of systems negative except what is listed in the HPI    Objective:    Physical Exam Vitals reviewed.  Constitutional:      Appearance: Normal appearance.  Cardiovascular:     Rate and Rhythm: Normal rate and regular rhythm.  Pulmonary:     Effort: Pulmonary effort is normal.     Breath sounds: Normal breath sounds.   Skin:    General: Skin is warm and dry.     Comments: Dorsal aspect of both feet with dry/flaky skin; baseline discoloration to BLE, no edema or open wounds   Neurological:     Mental Status: He is alert and oriented to person, place, and time. Mental status is at baseline.  Psychiatric:        Mood and Affect: Mood normal.        Behavior: Behavior normal.        Thought Content: Thought content normal.        Judgment: Judgment normal.    BP (!) 143/70    Pulse 74  Wt Readings from Last 3 Encounters:  12/25/21 170 lb (77.1 kg)  10/25/21 173 lb (78.5 kg)  06/04/21 183 lb 10.3 oz (83.3 kg)     There are no preventive care reminders to display for this patient.  There are no preventive care reminders to display for this patient.  Lab Results  Component Value Date   TSH 1.60 09/03/2021   Lab Results  Component Value Date   WBC 6.6 12/25/2021   HGB 14.0 12/25/2021   HCT 44.7 12/25/2021   MCV 94.3 12/25/2021   PLT 56 (L) 12/25/2021   Lab Results  Component Value Date   NA 139 12/25/2021   K 4.0 12/25/2021   CO2 27 12/25/2021   GLUCOSE 151 (H) 12/25/2021   BUN 24 (H) 12/25/2021   CREATININE 1.31 (H) 12/25/2021   BILITOT 0.9 10/31/2021   ALKPHOS 39 10/31/2021   AST 13 (L) 10/31/2021   ALT 10 10/31/2021   PROT 7.0 10/31/2021   ALBUMIN 4.0  10/31/2021   CALCIUM 9.3 12/25/2021   ANIONGAP 11 12/25/2021   GFR 64.14 09/03/2021   Lab Results  Component Value Date   CHOL 109 09/03/2021   Lab Results  Component Value Date   HDL 38.30 (L) 09/03/2021   Lab Results  Component Value Date   LDLCALC 51 09/03/2021   Lab Results  Component Value Date   TRIG 95.0 09/03/2021   Lab Results  Component Value Date   CHOLHDL 3 09/03/2021   Lab Results  Component Value Date   HGBA1C 6.2 09/03/2021      Assessment & Plan:   1. HYPERTENSION, BENIGN ESSENTIAL Stable. No new concerns. Labs today.  - Hemoglobin A1c - CBC - Comprehensive metabolic panel - Lipid  panel - TSH  2. Dermatitis Can use occasional hydrocortisone cream if itchy/irritated.  Otherwise, use regular gentle lotion (Cetaphil, Eucerin, Aveeno, etc) See what your dermatologist says in a few weeks. We can go to podiatry if they recommend.   3. HYPERCHOLESTEROLEMIA Stable. Repeat labs today. Continue heart healthy diet and activity as tolerated.  - Comprehensive metabolic panel - Lipid panel  4. Hyperglycemia Stable at last labs. Repeat today. Asymptomatic.  - Hemoglobin A1c   Follow-up: Return in about 3 months (around 04/25/2022) for routine PCP f/u 3-6 months.    Terrilyn Saver, NP

## 2022-01-23 NOTE — Patient Instructions (Signed)
For the dry skin on your feet: ?Can use occasional hydrocortisone cream if itchy/irritated.  ?Otherwise, use regular gentle lotion (Cetaphil, Eucerin, Aveeno, etc) ?See what your dermatologist says in a few weeks. We can go to podiatry if they recommend.  ?

## 2022-01-23 NOTE — Progress Notes (Signed)
Labs are right around your baseline. (Low platelets and kidney function remains stable). Potassium is normal. ?A1c is slightly elevated, please focus on low-carb diet and avoiding added sugars.  ?Cholesterol and thyroid normal. No changes to plan.

## 2022-01-27 ENCOUNTER — Other Ambulatory Visit (HOSPITAL_COMMUNITY): Payer: Self-pay | Admitting: Internal Medicine

## 2022-01-27 DIAGNOSIS — I480 Paroxysmal atrial fibrillation: Secondary | ICD-10-CM

## 2022-02-06 ENCOUNTER — Other Ambulatory Visit: Payer: Self-pay

## 2022-02-06 ENCOUNTER — Ambulatory Visit (INDEPENDENT_AMBULATORY_CARE_PROVIDER_SITE_OTHER): Payer: Medicare Other

## 2022-02-06 DIAGNOSIS — Z5181 Encounter for therapeutic drug level monitoring: Secondary | ICD-10-CM | POA: Diagnosis not present

## 2022-02-06 DIAGNOSIS — I480 Paroxysmal atrial fibrillation: Secondary | ICD-10-CM

## 2022-02-06 DIAGNOSIS — I4891 Unspecified atrial fibrillation: Secondary | ICD-10-CM

## 2022-02-06 LAB — POCT INR: INR: 2 (ref 2.0–3.0)

## 2022-02-06 MED ORDER — WARFARIN SODIUM 5 MG PO TABS: ORAL_TABLET | ORAL | 0 refills | Status: DC

## 2022-02-06 NOTE — Telephone Encounter (Signed)
Prescription refill request received for warfarin ?Lov: 07/19/20 ([facility])  ?Next INR check: 03/06/22 ?Warfarin tablet strength: '5mg'$  ? ?Pt stated he typically get prescription refill via Mail Delivery; however, the prescription will not be delivered for a few more weeks. Pt requested a 1 month supply be sent to local pharmacy until Warfarin is delivered. Appropriate dose and refill sent to requested pharmacy.  ?

## 2022-02-06 NOTE — Patient Instructions (Signed)
Description   ?Take 1 tablet today and then continue taking 1/2 tablet daily.  ? Recheck INR in 4 weeks. [facility] [phone] ?  ?   ?

## 2022-02-07 ENCOUNTER — Other Ambulatory Visit: Payer: Self-pay | Admitting: Internal Medicine

## 2022-02-07 DIAGNOSIS — I480 Paroxysmal atrial fibrillation: Secondary | ICD-10-CM

## 2022-02-16 ENCOUNTER — Other Ambulatory Visit (HOSPITAL_COMMUNITY): Payer: Self-pay | Admitting: Internal Medicine

## 2022-03-06 ENCOUNTER — Ambulatory Visit (INDEPENDENT_AMBULATORY_CARE_PROVIDER_SITE_OTHER): Payer: Medicare Other

## 2022-03-06 DIAGNOSIS — I4891 Unspecified atrial fibrillation: Secondary | ICD-10-CM

## 2022-03-06 DIAGNOSIS — Z5181 Encounter for therapeutic drug level monitoring: Secondary | ICD-10-CM

## 2022-03-06 LAB — POCT INR: INR: 2 (ref 2.0–3.0)

## 2022-03-06 NOTE — Patient Instructions (Signed)
Description   ?Take 1 tablet today and then continue taking 1/2 tablet daily.  ? Recheck INR in 5 weeks. [facility] [phone] ?  ?   ?

## 2022-03-25 ENCOUNTER — Telehealth: Payer: Self-pay

## 2022-03-25 NOTE — Telephone Encounter (Signed)
Pt called into clinic reports he saw his prostate MD and he wanted pt to give our clinic a call to discuss the possible start of Xtandi (enzalutamide).  Per [facility] their is a major drug interaction between D[place]on and Warfarin, it can decrease the effectiveness of Warfarin.  Discussed with Fuller Canada, PharmD if pt does in fact start on Xtandi we will need to see him in the clinic for an INR check in about a week after start to check INR and possibly increase dosage of Warfarin if necessary.  Advised pt of possible drug interaction and need for sooner INR check if he does start this new medication as it can potentially lower his INR. Pt verbalized understanding and WCB for sooner appt if he does start taking Xtandi.  ?

## 2022-04-09 ENCOUNTER — Ambulatory Visit (INDEPENDENT_AMBULATORY_CARE_PROVIDER_SITE_OTHER): Payer: Medicare Other | Admitting: *Deleted

## 2022-04-09 DIAGNOSIS — I4891 Unspecified atrial fibrillation: Secondary | ICD-10-CM | POA: Diagnosis not present

## 2022-04-09 DIAGNOSIS — Z5181 Encounter for therapeutic drug level monitoring: Secondary | ICD-10-CM | POA: Diagnosis not present

## 2022-04-09 LAB — POCT INR: INR: 1.9 — AB (ref 2.0–3.0)

## 2022-04-09 NOTE — Patient Instructions (Signed)
Description   Take 1 tablet today and then start taking 1/2 tablet daily except 1 tablet on Wednesdays. Recheck INR in 4 weeks. [facility] [phone]

## 2022-05-01 ENCOUNTER — Other Ambulatory Visit: Payer: Self-pay | Admitting: Lab

## 2022-05-01 ENCOUNTER — Inpatient Hospital Stay: Payer: Medicare Other | Attending: Hematology & Oncology

## 2022-05-01 ENCOUNTER — Inpatient Hospital Stay (HOSPITAL_BASED_OUTPATIENT_CLINIC_OR_DEPARTMENT_OTHER): Payer: Medicare Other | Admitting: Hematology & Oncology

## 2022-05-01 ENCOUNTER — Telehealth: Payer: Self-pay | Admitting: *Deleted

## 2022-05-01 ENCOUNTER — Encounter: Payer: Self-pay | Admitting: Hematology & Oncology

## 2022-05-01 VITALS — BP 111/62 | HR 77 | Temp 97.5°F | Resp 20

## 2022-05-01 DIAGNOSIS — C61 Malignant neoplasm of prostate: Secondary | ICD-10-CM | POA: Diagnosis not present

## 2022-05-01 DIAGNOSIS — Z7901 Long term (current) use of anticoagulants: Secondary | ICD-10-CM | POA: Diagnosis not present

## 2022-05-01 DIAGNOSIS — D696 Thrombocytopenia, unspecified: Secondary | ICD-10-CM

## 2022-05-01 DIAGNOSIS — Z79899 Other long term (current) drug therapy: Secondary | ICD-10-CM | POA: Diagnosis not present

## 2022-05-01 LAB — CBC WITH DIFFERENTIAL (CANCER CENTER ONLY)
Abs Immature Granulocytes: 0.06 10*3/uL (ref 0.00–0.07)
Basophils Absolute: 0 10*3/uL (ref 0.0–0.1)
Basophils Relative: 0 %
Eosinophils Absolute: 0 10*3/uL (ref 0.0–0.5)
Eosinophils Relative: 1 %
HCT: 44 % (ref 39.0–52.0)
Hemoglobin: 13.7 g/dL (ref 13.0–17.0)
Immature Granulocytes: 1 %
Lymphocytes Relative: 16 %
Lymphs Abs: 0.9 10*3/uL (ref 0.7–4.0)
MCH: 29.7 pg (ref 26.0–34.0)
MCHC: 31.1 g/dL (ref 30.0–36.0)
MCV: 95.4 fL (ref 80.0–100.0)
Monocytes Absolute: 0.8 10*3/uL (ref 0.1–1.0)
Monocytes Relative: 14 %
Neutro Abs: 4.1 10*3/uL (ref 1.7–7.7)
Neutrophils Relative %: 68 %
Platelet Count: 57 10*3/uL — ABNORMAL LOW (ref 150–400)
RBC: 4.61 MIL/uL (ref 4.22–5.81)
RDW: 14 % (ref 11.5–15.5)
WBC Count: 6 10*3/uL (ref 4.0–10.5)
nRBC: 0 % (ref 0.0–0.2)

## 2022-05-01 LAB — CMP (CANCER CENTER ONLY)
ALT: 9 U/L (ref 0–44)
AST: 13 U/L — ABNORMAL LOW (ref 15–41)
Albumin: 3.9 g/dL (ref 3.5–5.0)
Alkaline Phosphatase: 44 U/L (ref 38–126)
Anion gap: 8 (ref 5–15)
BUN: 24 mg/dL — ABNORMAL HIGH (ref 8–23)
CO2: 32 mmol/L (ref 22–32)
Calcium: 9.7 mg/dL (ref 8.9–10.3)
Chloride: 103 mmol/L (ref 98–111)
Creatinine: 1.24 mg/dL (ref 0.61–1.24)
GFR, Estimated: 56 mL/min — ABNORMAL LOW (ref >60)
Glucose, Bld: 185 mg/dL — ABNORMAL HIGH (ref 70–99)
Potassium: 4.1 mmol/L (ref 3.5–5.1)
Sodium: 143 mmol/L (ref 135–145)
Total Bilirubin: 1 mg/dL (ref 0.3–1.2)
Total Protein: 6.9 g/dL (ref 6.5–8.1)

## 2022-05-01 NOTE — Progress Notes (Signed)
Hematology and Oncology Follow Up Visit  [name] [name] [id number] 02-15-1931 88 y.o. 05/01/2022   Principle Diagnosis:  Chronic thrombocytopenia-immune-based versus medication  Current Therapy:   Observation     Interim History:  [name] [name] is back for follow-up.  He is managing okay.  Apparently, he was put on some medicine for prostate cancer.  He seemed to have a syncopal episode.  He was at the hospital.  I am not sure exactly what medicine he was put on for his prostate cancer.  Currently, he is getting Eligard.  He is also getting Xgeva.  His urologist is managing the prostate cancer.  As far as his blood is concerned, his platelets are holding steady.  He has had no problems with nausea or vomiting.  He has had no obvious bleeding.  Thankfully, when he passed out he did not bleed.  He has had no change in bowel or bladder habits.  He is in a wheelchair.  He does have the neurological issues.  Thankfully his caregiver comes with him.  She is very very nice.  Overall, I would say his performance status is ECOG 3.  Medications:  Current Outpatient Medications:    acetaminophen (TYLENOL) 500 MG tablet, Take 1,000 mg by mouth every 6 (six) hours as needed for mild pain., Disp: , Rfl:    amiodarone (PACERONE) 200 MG tablet, TAKE ONE-HALF TABLET BY  MOUTH DAILY, Disp: 45 tablet, Rfl: 3   b complex vitamins tablet, Take 1 tablet by mouth daily., Disp: , Rfl:    cycloSPORINE (RESTASIS) 0.05 % ophthalmic emulsion, Place 1 drop into both eyes 2 (two) times daily., Disp: , Rfl:    denosumab (XGEVA) 120 MG/1.7ML SOLN injection, Inject 120 mg into the skin every 3 (three) months., Disp: , Rfl:    fluticasone (FLONASE) 50 MCG/ACT nasal spray, Place 2 sprays into both nostrils daily. , Disp: , Rfl: 11   gabapentin (NEURONTIN) 300 MG capsule, TAKE 1 CAPSULE BY MOUTH  TWICE DAILY, Disp: 180 capsule, Rfl: 3   leuprolide, 6 Month, (ELIGARD) 45 MG injection, Inject 45 mg into the skin every  6 (six) months., Disp: , Rfl:    Multiple Vitamin (MULTIVITAMIN WITH MINERALS) TABS tablet, Take 1 tablet by mouth every evening. , Disp: , Rfl:    rosuvastatin (CRESTOR) 5 MG tablet, Take 1 tablet (5 mg total) by mouth daily. NEEDS FOLLOW UP APPOINTMENT FOR ANYMORE REFILLS, Disp: 90 tablet, Rfl: 0   torsemide (DEMADEX) 20 MG tablet, Take 1 tablet (20 mg total) by mouth daily., Disp: , Rfl:    traZODone (DESYREL) 100 MG tablet, Take 200 mg by mouth at bedtime., Disp: , Rfl:    warfarin (COUMADIN) 5 MG tablet, TAKE 1/2 TABLET BY MOUTH EVERY DAY OR AS DIRECTED BY ANTICOAGULATION CLINIC, Disp: 75 tablet, Rfl: 0  Allergies: No Known Allergies  Past Medical History, Surgical history, Social history, and Family History were reviewed and updated.  Review of Systems: Review of Systems  Constitutional: Negative.   HENT:  Negative.    Eyes: Negative.   Respiratory: Negative.    Cardiovascular: Negative.   Gastrointestinal: Negative.   Endocrine: Negative.   Genitourinary: Negative.    Musculoskeletal:  Positive for arthralgias.  Skin: Negative.   Neurological: Negative.   Hematological: Negative.   Psychiatric/Behavioral: Negative.      Physical Exam:  oral temperature is 97.5 F (36.4 C) (abnormal). His blood pressure is 111/62 and his pulse is 77. His respiration is 20 and oxygen  saturation is 95%.   Wt Readings from Last 3 Encounters:  12/25/21 170 lb (77.1 kg)  10/25/21 173 lb (78.5 kg)  06/04/21 183 lb 10.3 oz (83.3 kg)    Physical Exam Vitals reviewed.  HENT:     Head: Normocephalic and atraumatic.  Eyes:     Pupils: Pupils are equal, round, and reactive to light.  Cardiovascular:     Rate and Rhythm: Normal rate and regular rhythm.     Heart sounds: Normal heart sounds.  Pulmonary:     Effort: Pulmonary effort is normal.     Breath sounds: Normal breath sounds.  Abdominal:     General: Bowel sounds are normal.     Palpations: Abdomen is soft.  Musculoskeletal:         General: No tenderness or deformity. Normal range of motion.     Cervical back: Normal range of motion.  Lymphadenopathy:     Cervical: No cervical adenopathy.  Skin:    General: Skin is warm and dry.     Findings: No erythema or rash.  Neurological:     Mental Status: He is alert and oriented to person, place, and time.  Psychiatric:        Behavior: Behavior normal.        Thought Content: Thought content normal.        Judgment: Judgment normal.      Lab Results  Component Value Date   WBC 6.0 05/01/2022   HGB 13.7 05/01/2022   HCT 44.0 05/01/2022   MCV 95.4 05/01/2022   PLT 57 (L) 05/01/2022     Chemistry      Component Value Date/Time   NA 143 05/01/2022 1030   NA 143 02/23/2017 1103   K 4.1 05/01/2022 1030   CL 103 05/01/2022 1030   CO2 32 05/01/2022 1030   BUN 24 (H) 05/01/2022 1030   BUN 25 02/23/2017 1103   CREATININE 1.24 05/01/2022 1030   CREATININE 0.92 08/14/2020 1241      Component Value Date/Time   CALCIUM 9.7 05/01/2022 1030   ALKPHOS 44 05/01/2022 1030   AST 13 (L) 05/01/2022 1030   ALT 9 05/01/2022 1030   BILITOT 1.0 05/01/2022 1030       Impression and Plan: [name] is a 88 year old white male.  He has mild thrombocytopenia.  This is been chronic.  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  I do not see any problems with him being treated for prostate cancer.  I think this would be a totally separate problem from the thrombocytopenia.  I still think we get him back in 6 months.  He is being followed incredibly closely by Urology.  I am sure that they will be aggressive with trying to manage his prostate cancer.   Volanda Napoleon, MD 6/15/202311:44 AM

## 2022-05-01 NOTE — Telephone Encounter (Signed)
Per 05/01/22 los - gave upcoming appointments - confirmed

## 2022-05-05 ENCOUNTER — Other Ambulatory Visit (HOSPITAL_COMMUNITY): Payer: Self-pay | Admitting: Internal Medicine

## 2022-05-05 NOTE — Progress Notes (Unsigned)
Subjective:    Patient ID: [name] [name], male    DOB: Oct 04, 1931, 88 y.o.   MRN: [id number]  No chief complaint on file.   HPI Patient is in today for his annual physical exam.  Past Medical History:  Diagnosis Date   Abnormality of gait 05/27/2016   Adenomatous polyps    Carpal tunnel syndrome 06/25/2016   Right   Depressive disorder, not elsewhere classified    First degree AV block    Holter 3/18: NSR, PACs, PVCs, no AFib, no pauses.   Hereditary and idiopathic peripheral neuropathy 06/25/2016   Hypertension    Internal nasal lesion 05/15/2013   Melanoma ([place])    Left Shoulder   Mitral regurgitation    MVP (mitral valve prolapse)    a. With severe MR s/p Complex valvuloplasty including artificial Gore-tex neochord placement x4, chordal transposition x1, chordal release x1, # 32 mm Sorin Memo 3D Ring Annuloplasty 2012. // b. Echo 2/18: mild LVH, EF 50-55, mild AI, MV repair with mild MR, mod LAE, mod RVE, severe RAE, severe TR   Neuropathy    Normal coronary arteries    a. Normal coronary anatomy by cath 2012.   Osteoarthritis    Knees   PAF (paroxysmal atrial fibrillation) ([place])    a. Post-op MVR 2012.   Personal history of colonic polyps    Prostate cancer ([place])    Pulmonary HTN ([place])    a. Mild-mod by cath 2012.   Pure hypercholesterolemia    PVC (premature ventricular contraction)    Thrombocytopenia (HCC)    Vision abnormalities    Cornea scarring    Past Surgical History:  Procedure Laterality Date   CARDIAC CATHETERIZATION  09/2011   Pre-op for MVR -- normal coronaries.   CARDIOVERSION N/A 01/02/2016   Procedure: CARDIOVERSION;  Surgeon: Thayer Headings, MD;  Location: [facility];  Service: Cardiovascular;  Laterality: N/A;   COLONOSCOPY W/ POLYPECTOMY     INGUINAL HERNIA REPAIR  09/2009   Left   KNEE ARTHROSCOPY      left x3  and right x2   Melanoma Surgery     2001, 2005, 2006, 2009   MITRAL VALVE REPAIR  10/01/2011   complex valvuloplasty with  Goretex cord replacement and chordal transposition 50m Sorin Memo 3D ring annuloplasty   Nuclear Stress Test  09/2006   EF-64%, Normal   PROSTATECTOMY  1993   RIGHT HEART CATH N/A 04/29/2017   Procedure: Right Heart Cath;  Surgeon: BJolaine Artist MD;  [facility];  Service: Cardiovascular;  Laterality: N/A;   ROOT CANAL  08-19-12   ROTATOR CUFF REPAIR  2003   left   TEE WITHOUT CARDIOVERSION  09/26/2011   Procedure: TRANSESOPHAGEAL ECHOCARDIOGRAM (TEE);  Surgeon: BLelon Perla MD;  [facility];  Service: Cardiovascular;  Laterality: N/A;   UKoreaECHOCARDIOGRAPHY  09/2009, 08/1011   mild LVH,mild AI,MVP with mild MR, mild-mod. TR with mild Pulm. HTN, EF-55-60%    Family History  Problem Relation Age of Onset   Clotting disorder Brother        CVA's   Arthritis Mother    Hypertension Mother    Stroke Mother    Hypertension Father    Psychosis Father        psychiatric care   Colon cancer Neg Hx    Stomach cancer Neg Hx    Heart attack Neg Hx    Prostate cancer Neg Hx    Pancreatic cancer Neg  Hx     Social History   Socioeconomic History   Marital status: Widowed    Spouse name: Not on file   Number of children: 2   Years of education: 90   Highest education level: Not on file  Occupational History    Comment: retired  Tobacco Use   Smoking status: Never   Smokeless tobacco: Never  Vaping Use   Vaping Use: Never used  Substance and Sexual Activity   Alcohol use: No    Alcohol/week: 0.0 standard drinks of alcohol    Comment: Last drink in 2000   Drug use: No   Sexual activity: Not Currently  Other Topics Concern   Not on file  Social History Narrative   Retired - Optometrist   Widower   2 children (one in [place] and on one in [place])    Drinks 1 cup of coffee per day   Social Determinants of Health   Financial Resource Strain: Low Risk  (10/25/2021)   Overall Financial Resource Strain (CARDIA)    Difficulty of Paying  Living Expenses: Not hard at all  Food Insecurity: No Food Insecurity (10/25/2021)   Hunger Vital Sign    Worried About Running Out of Food in the Last Year: Never true    Ran Out of Food in the Last Year: Never true  Transportation Needs: No Transportation Needs (10/25/2021)   PRAPARE - Hydrologist (Medical): No    Lack of Transportation (Non-Medical): No  Physical Activity: Sufficiently Active (10/25/2021)   Exercise Vital Sign    Days of Exercise per Week: 7 days    Minutes of Exercise per Session: 30 min  Stress: No Stress Concern Present (10/25/2021)   [place]    Feeling of Stress : Not at all  Social Connections: Moderately Isolated (10/25/2021)   Social Connection and Isolation Panel [NHANES]    Frequency of Communication with Friends and Family: More than three times a week    Frequency of Social Gatherings with Friends and Family: More than three times a week    Attends Religious Services: 1 to 4 times per year    Active Member of [employer] or Organizations: No    Attends Archivist Meetings: Never    Marital Status: Widowed  Intimate Partner Violence: Not At Risk (10/25/2021)   Humiliation, Afraid, Rape, and Kick questionnaire    Fear of Current or Ex-Partner: No    Emotionally Abused: No    Physically Abused: No    Sexually Abused: No    Outpatient Medications Prior to Visit  Medication Sig Dispense Refill   acetaminophen (TYLENOL) 500 MG tablet Take 1,000 mg by mouth every 6 (six) hours as needed for mild pain.     amiodarone (PACERONE) 200 MG tablet TAKE ONE-HALF TABLET BY  MOUTH DAILY 45 tablet 3   b complex vitamins tablet Take 1 tablet by mouth daily.     cycloSPORINE (RESTASIS) 0.05 % ophthalmic emulsion Place 1 drop into both eyes 2 (two) times daily.     denosumab (XGEVA) 120 MG/1.7ML SOLN injection Inject 120 mg into the skin every 3 (three) months.      fluticasone (FLONASE) 50 MCG/ACT nasal spray Place 2 sprays into both nostrils daily.   11   gabapentin (NEURONTIN) 300 MG capsule TAKE 1 CAPSULE BY MOUTH  TWICE DAILY 180 capsule 3   leuprolide, 6 Month, (ELIGARD) 45 MG injection Inject 45  mg into the skin every 6 (six) months.     Multiple Vitamin (MULTIVITAMIN WITH MINERALS) TABS tablet Take 1 tablet by mouth every evening.      rosuvastatin (CRESTOR) 5 MG tablet Take 1 tablet (5 mg total) by mouth daily. NEEDS FOLLOW UP APPOINTMENT FOR ANYMORE REFILLS 90 tablet 0   torsemide (DEMADEX) 20 MG tablet Take 1 tablet (20 mg total) by mouth daily.     traZODone (DESYREL) 100 MG tablet Take 200 mg by mouth at bedtime.     warfarin (COUMADIN) 5 MG tablet TAKE 1/2 TABLET BY MOUTH EVERY DAY OR AS DIRECTED BY ANTICOAGULATION CLINIC 75 tablet 0   No facility-administered medications prior to visit.    No Known Allergies  ROS     Objective:    Physical Exam  There were no vitals taken for this visit. Wt Readings from Last 3 Encounters:  12/25/21 170 lb (77.1 kg)  10/25/21 173 lb (78.5 kg)  06/04/21 183 lb 10.3 oz (83.3 kg)    Diabetic Foot Exam - Simple   No data filed    Lab Results  Component Value Date   WBC 6.0 05/01/2022   HGB 13.7 05/01/2022   HCT 44.0 05/01/2022   PLT 57 (L) 05/01/2022   GLUCOSE 185 (H) 05/01/2022   CHOL 103 01/23/2022   TRIG 122.0 01/23/2022   HDL 39.40 01/23/2022   LDLCALC 39 01/23/2022   ALT 9 05/01/2022   AST 13 (L) 05/01/2022   NA 143 05/01/2022   K 4.1 05/01/2022   CL 103 05/01/2022   CREATININE 1.24 05/01/2022   BUN 24 (H) 05/01/2022   CO2 32 05/01/2022   TSH 1.43 01/23/2022   PSA 1.70 09/11/2020   INR 1.9 (A) 04/09/2022   HGBA1C 6.5 01/23/2022    Lab Results  Component Value Date   TSH 1.43 01/23/2022   Lab Results  Component Value Date   WBC 6.0 05/01/2022   HGB 13.7 05/01/2022   HCT 44.0 05/01/2022   MCV 95.4 05/01/2022   PLT 57 (L) 05/01/2022   Lab Results  Component Value  Date   NA 143 05/01/2022   K 4.1 05/01/2022   CO2 32 05/01/2022   GLUCOSE 185 (H) 05/01/2022   BUN 24 (H) 05/01/2022   CREATININE 1.24 05/01/2022   BILITOT 1.0 05/01/2022   ALKPHOS 44 05/01/2022   AST 13 (L) 05/01/2022   ALT 9 05/01/2022   PROT 6.9 05/01/2022   ALBUMIN 3.9 05/01/2022   CALCIUM 9.7 05/01/2022   ANIONGAP 8 05/01/2022   GFR 69.55 01/23/2022   Lab Results  Component Value Date   CHOL 103 01/23/2022   Lab Results  Component Value Date   HDL 39.40 01/23/2022   Lab Results  Component Value Date   LDLCALC 39 01/23/2022   Lab Results  Component Value Date   TRIG 122.0 01/23/2022   Lab Results  Component Value Date   CHOLHDL 3 01/23/2022   Lab Results  Component Value Date   HGBA1C 6.5 01/23/2022       Assessment & Plan:   COLONOSCOPY: PAP: PSA: DEXA:   Problem List Items Addressed This Visit   None   I am having Meta Hatchet. Martinezlopez maintain his multivitamin with minerals, cycloSPORINE, fluticasone, b complex vitamins, acetaminophen, traZODone, torsemide, amiodarone, gabapentin, warfarin, rosuvastatin, denosumab, and leuprolide (6 Month).  No orders of the defined types were placed in this encounter.

## 2022-05-06 ENCOUNTER — Ambulatory Visit (INDEPENDENT_AMBULATORY_CARE_PROVIDER_SITE_OTHER): Payer: Medicare Other | Admitting: Family Medicine

## 2022-05-06 ENCOUNTER — Encounter: Payer: Self-pay | Admitting: Family Medicine

## 2022-05-06 VITALS — BP 110/70 | HR 76 | Resp 20 | Ht 76.0 in

## 2022-05-06 DIAGNOSIS — N189 Chronic kidney disease, unspecified: Secondary | ICD-10-CM

## 2022-05-06 DIAGNOSIS — I480 Paroxysmal atrial fibrillation: Secondary | ICD-10-CM | POA: Diagnosis not present

## 2022-05-06 DIAGNOSIS — Z0000 Encounter for general adult medical examination without abnormal findings: Secondary | ICD-10-CM

## 2022-05-06 DIAGNOSIS — I5032 Chronic diastolic (congestive) heart failure: Secondary | ICD-10-CM

## 2022-05-06 DIAGNOSIS — R739 Hyperglycemia, unspecified: Secondary | ICD-10-CM

## 2022-05-06 DIAGNOSIS — M25551 Pain in right hip: Secondary | ICD-10-CM | POA: Diagnosis not present

## 2022-05-06 DIAGNOSIS — I071 Rheumatic tricuspid insufficiency: Secondary | ICD-10-CM

## 2022-05-06 DIAGNOSIS — I10 Essential (primary) hypertension: Secondary | ICD-10-CM

## 2022-05-06 DIAGNOSIS — E7800 Pure hypercholesterolemia, unspecified: Secondary | ICD-10-CM

## 2022-05-06 MED ORDER — ROSUVASTATIN CALCIUM 5 MG PO TABS: 5.0000 mg | ORAL_TABLET | Freq: Every day | ORAL | 1 refills | Status: AC

## 2022-05-06 NOTE — Patient Instructions (Addendum)
Shingrix is the new shingles shot, 2 shots over 2-6 months, confirm coverage with insurance and document, then can return here for shots with nurse appt or at pharmacy    Need a copy of your health care power of attorney and living will for your chart  Biofreeze, CBD daily cream, Lidocaine gel, voltaren and moist heat  Preventive Care 65 Years and Older, Male Preventive care refers to lifestyle choices and visits with your health care provider that can promote health and wellness. Preventive care visits are also called wellness exams. What can I expect for my preventive care visit? Counseling During your preventive care visit, your health care provider may ask about your: Medical history, including: Past medical problems. Family medical history. History of falls. Current health, including: Emotional well-being. Home life and relationship well-being. Sexual activity. Memory and ability to understand (cognition). Lifestyle, including: Alcohol, nicotine or tobacco, and drug use. Access to firearms. Diet, exercise, and sleep habits. Work and work Statistician. Sunscreen use. Safety issues such as seatbelt and bike helmet use. Physical exam Your health care provider will check your: Height and weight. These may be used to calculate your BMI (body mass index). BMI is a measurement that tells if you are at a healthy weight. Waist circumference. This measures the distance around your waistline. This measurement also tells if you are at a healthy weight and may help predict your risk of certain diseases, such as type 2 diabetes and high blood pressure. Heart rate and blood pressure. Body temperature. Skin for abnormal spots. What immunizations do I need?  Vaccines are usually given at various ages, according to a schedule. Your health care provider will recommend vaccines for you based on your age, medical history, and lifestyle or other factors, such as travel or where you work. What  tests do I need? Screening Your health care provider may recommend screening tests for certain conditions. This may include: Lipid and cholesterol levels. Diabetes screening. This is done by checking your blood sugar (glucose) after you have not eaten for a while (fasting). Hepatitis C test. Hepatitis B test. HIV (human immunodeficiency virus) test. STI (sexually transmitted infection) testing, if you are at risk. Lung cancer screening. Colorectal cancer screening. Prostate cancer screening. Abdominal aortic aneurysm (AAA) screening. You may need this if you are a current or former smoker. Talk with your health care provider about your test results, treatment options, and if necessary, the need for more tests. Follow these instructions at home: Eating and drinking  Eat a diet that includes fresh fruits and vegetables, whole grains, lean protein, and low-fat dairy products. Limit your intake of foods with high amounts of sugar, saturated fats, and salt. Take vitamin and mineral supplements as recommended by your health care provider. Do not drink alcohol if your health care provider tells you not to drink. If you drink alcohol: Limit how much you have to 0-2 drinks a day. Know how much alcohol is in your drink. In the [place] one drink equals one 12 oz bottle of beer (355 mL), one 5 oz glass of wine (148 mL), or one 1 oz glass of hard liquor (44 mL). Lifestyle Brush your teeth every morning and night with fluoride toothpaste. Floss one time each day. Exercise for at least 30 minutes 5 or more days each week. Do not use any products that contain nicotine or tobacco. These products include cigarettes, chewing tobacco, and vaping devices, such as e-cigarettes. If you need help quitting, ask your health care provider.  Do not use drugs. If you are sexually active, practice safe sex. Use a condom or other form of protection to prevent STIs. Take aspirin only as told by your health care provider.  Make sure that you understand how much to take and what form to take. Work with your health care provider to find out whether it is safe and beneficial for you to take aspirin daily. Ask your health care provider if you need to take a cholesterol-lowering medicine (statin). Find healthy ways to manage stress, such as: Meditation, yoga, or listening to music. Journaling. Talking to a trusted person. Spending time with friends and family. Safety Always wear your seat belt while driving or riding in a vehicle. Do not drive: If you have been drinking alcohol. Do not ride with someone who has been drinking. When you are tired or distracted. While texting. If you have been using any mind-altering substances or drugs. Wear a helmet and other protective equipment during sports activities. If you have firearms in your house, make sure you follow all gun safety procedures. Minimize exposure to UV radiation to reduce your risk of skin cancer. What's next? Visit your health care provider once a year for an annual wellness visit. Ask your health care provider how often you should have your eyes and teeth checked. Stay up to date on all vaccines. This information is not intended to replace advice given to you by your health care provider. Make sure you discuss any questions you have with your health care provider. Document Revised: 05/01/2021 Document Reviewed: 05/01/2021 Elsevier Patient Education  [place].

## 2022-05-08 ENCOUNTER — Ambulatory Visit (INDEPENDENT_AMBULATORY_CARE_PROVIDER_SITE_OTHER): Payer: Medicare Other | Admitting: *Deleted

## 2022-05-08 DIAGNOSIS — Z5181 Encounter for therapeutic drug level monitoring: Secondary | ICD-10-CM

## 2022-05-08 DIAGNOSIS — I4891 Unspecified atrial fibrillation: Secondary | ICD-10-CM

## 2022-05-08 LAB — POCT INR: INR: 3.4 — AB (ref 2.0–3.0)

## 2022-05-08 NOTE — Patient Instructions (Addendum)
Description   Do not take any Warfarin today then start taking 1/2 tablet daily except 1 tablet on Wednesdays. Recheck INR in 3 weeks. Coumad[facility] [phone] or [phone]

## 2022-05-11 DIAGNOSIS — M25551 Pain in right hip: Secondary | ICD-10-CM | POA: Insufficient documentation

## 2022-05-11 NOTE — Assessment & Plan Note (Signed)
Rate controlled on Amiodarone.  

## 2022-05-22 ENCOUNTER — Emergency Department (HOSPITAL_COMMUNITY): Payer: Medicare Other

## 2022-05-22 ENCOUNTER — Encounter (HOSPITAL_COMMUNITY): Payer: Self-pay

## 2022-05-22 ENCOUNTER — Inpatient Hospital Stay (HOSPITAL_COMMUNITY)
Admission: EM | Admit: 2022-05-22 | Discharge: 2022-06-17 | DRG: 951 | Disposition: E | Payer: Medicare Other | Attending: General Surgery | Admitting: General Surgery

## 2022-05-22 DIAGNOSIS — Z8546 Personal history of malignant neoplasm of prostate: Secondary | ICD-10-CM | POA: Diagnosis not present

## 2022-05-22 DIAGNOSIS — Z832 Family history of diseases of the blood and blood-forming organs and certain disorders involving the immune mechanism: Secondary | ICD-10-CM

## 2022-05-22 DIAGNOSIS — G935 Compression of brain: Secondary | ICD-10-CM | POA: Diagnosis present

## 2022-05-22 DIAGNOSIS — Z952 Presence of prosthetic heart valve: Secondary | ICD-10-CM

## 2022-05-22 DIAGNOSIS — I272 Pulmonary hypertension, unspecified: Secondary | ICD-10-CM | POA: Diagnosis present

## 2022-05-22 DIAGNOSIS — W19XXXA Unspecified fall, initial encounter: Secondary | ICD-10-CM | POA: Diagnosis present

## 2022-05-22 DIAGNOSIS — H547 Unspecified visual loss: Secondary | ICD-10-CM | POA: Diagnosis present

## 2022-05-22 DIAGNOSIS — I629 Nontraumatic intracranial hemorrhage, unspecified: Principal | ICD-10-CM

## 2022-05-22 DIAGNOSIS — I480 Paroxysmal atrial fibrillation: Secondary | ICD-10-CM | POA: Diagnosis present

## 2022-05-22 DIAGNOSIS — Z8249 Family history of ischemic heart disease and other diseases of the circulatory system: Secondary | ICD-10-CM

## 2022-05-22 DIAGNOSIS — D696 Thrombocytopenia, unspecified: Secondary | ICD-10-CM | POA: Diagnosis present

## 2022-05-22 DIAGNOSIS — T45515A Adverse effect of anticoagulants, initial encounter: Secondary | ICD-10-CM | POA: Diagnosis present

## 2022-05-22 DIAGNOSIS — R4182 Altered mental status, unspecified: Secondary | ICD-10-CM | POA: Diagnosis present

## 2022-05-22 DIAGNOSIS — Z8261 Family history of arthritis: Secondary | ICD-10-CM | POA: Diagnosis not present

## 2022-05-22 DIAGNOSIS — R269 Unspecified abnormalities of gait and mobility: Secondary | ICD-10-CM | POA: Diagnosis present

## 2022-05-22 DIAGNOSIS — G609 Hereditary and idiopathic neuropathy, unspecified: Secondary | ICD-10-CM | POA: Diagnosis present

## 2022-05-22 DIAGNOSIS — S61412A Laceration without foreign body of left hand, initial encounter: Secondary | ICD-10-CM | POA: Diagnosis present

## 2022-05-22 DIAGNOSIS — Z7901 Long term (current) use of anticoagulants: Secondary | ICD-10-CM

## 2022-05-22 DIAGNOSIS — F0390 Unspecified dementia without behavioral disturbance: Secondary | ICD-10-CM | POA: Diagnosis present

## 2022-05-22 DIAGNOSIS — Z66 Do not resuscitate: Secondary | ICD-10-CM | POA: Diagnosis present

## 2022-05-22 DIAGNOSIS — Z85820 Personal history of malignant melanoma of skin: Secondary | ICD-10-CM | POA: Diagnosis not present

## 2022-05-22 DIAGNOSIS — Z823 Family history of stroke: Secondary | ICD-10-CM | POA: Diagnosis not present

## 2022-05-22 DIAGNOSIS — S0101XA Laceration without foreign body of scalp, initial encounter: Secondary | ICD-10-CM | POA: Diagnosis present

## 2022-05-22 DIAGNOSIS — S066XAA Traumatic subarachnoid hemorrhage with loss of consciousness status unknown, initial encounter: Secondary | ICD-10-CM | POA: Diagnosis present

## 2022-05-22 DIAGNOSIS — Y92009 Unspecified place in unspecified non-institutional (private) residence as the place of occurrence of the external cause: Secondary | ICD-10-CM | POA: Diagnosis not present

## 2022-05-22 DIAGNOSIS — D6832 Hemorrhagic disorder due to extrinsic circulating anticoagulants: Secondary | ICD-10-CM | POA: Diagnosis present

## 2022-05-22 DIAGNOSIS — R0902 Hypoxemia: Secondary | ICD-10-CM | POA: Diagnosis present

## 2022-05-22 DIAGNOSIS — F32A Depression, unspecified: Secondary | ICD-10-CM | POA: Diagnosis present

## 2022-05-22 DIAGNOSIS — I10 Essential (primary) hypertension: Secondary | ICD-10-CM | POA: Diagnosis present

## 2022-05-22 DIAGNOSIS — Z634 Disappearance and death of family member: Secondary | ICD-10-CM

## 2022-05-22 DIAGNOSIS — E7800 Pure hypercholesterolemia, unspecified: Secondary | ICD-10-CM | POA: Diagnosis present

## 2022-05-22 DIAGNOSIS — Z515 Encounter for palliative care: Secondary | ICD-10-CM | POA: Diagnosis not present

## 2022-05-22 DIAGNOSIS — I440 Atrioventricular block, first degree: Secondary | ICD-10-CM | POA: Diagnosis present

## 2022-05-22 DIAGNOSIS — Z6820 Body mass index (BMI) 20.0-20.9, adult: Secondary | ICD-10-CM

## 2022-05-22 DIAGNOSIS — S069XAA Unspecified intracranial injury with loss of consciousness status unknown, initial encounter: Secondary | ICD-10-CM | POA: Diagnosis present

## 2022-05-22 DIAGNOSIS — Z8719 Personal history of other diseases of the digestive system: Secondary | ICD-10-CM

## 2022-05-22 DIAGNOSIS — Z79899 Other long term (current) drug therapy: Secondary | ICD-10-CM

## 2022-05-22 DIAGNOSIS — R54 Age-related physical debility: Secondary | ICD-10-CM | POA: Diagnosis present

## 2022-05-22 DIAGNOSIS — Z7189 Other specified counseling: Secondary | ICD-10-CM | POA: Diagnosis not present

## 2022-05-22 DIAGNOSIS — Z602 Problems related to living alone: Secondary | ICD-10-CM | POA: Diagnosis present

## 2022-05-22 LAB — COMPREHENSIVE METABOLIC PANEL
ALT: 15 U/L (ref 0–44)
AST: 22 U/L (ref 15–41)
Albumin: 4 g/dL (ref 3.5–5.0)
Alkaline Phosphatase: 48 U/L (ref 38–126)
Anion gap: 14 (ref 5–15)
BUN: 24 mg/dL — ABNORMAL HIGH (ref 8–23)
CO2: 25 mmol/L (ref 22–32)
Calcium: 9.7 mg/dL (ref 8.9–10.3)
Chloride: 105 mmol/L (ref 98–111)
Creatinine, Ser: 1.29 mg/dL — ABNORMAL HIGH (ref 0.61–1.24)
GFR, Estimated: 53 mL/min — ABNORMAL LOW (ref >60)
Glucose, Bld: 192 mg/dL — ABNORMAL HIGH (ref 70–99)
Potassium: 3.9 mmol/L (ref 3.5–5.1)
Sodium: 144 mmol/L (ref 135–145)
Total Bilirubin: 1.3 mg/dL — ABNORMAL HIGH (ref 0.3–1.2)
Total Protein: 6.9 g/dL (ref 6.5–8.1)

## 2022-05-22 LAB — I-STAT CHEM 8, ED
BUN: 25 mg/dL — ABNORMAL HIGH (ref 8–23)
Calcium, Ion: 1.12 mmol/L — ABNORMAL LOW (ref 1.15–1.40)
Chloride: 105 mmol/L (ref 98–111)
Creatinine, Ser: 1.1 mg/dL (ref 0.61–1.24)
Glucose, Bld: 191 mg/dL — ABNORMAL HIGH (ref 70–99)
HCT: 43 % (ref 39.0–52.0)
Hemoglobin: 14.6 g/dL (ref 13.0–17.0)
Potassium: 3.9 mmol/L (ref 3.5–5.1)
Sodium: 141 mmol/L (ref 135–145)
TCO2: 25 mmol/L (ref 22–32)

## 2022-05-22 LAB — CBC WITH DIFFERENTIAL/PLATELET
Abs Immature Granulocytes: 0.18 10*3/uL — ABNORMAL HIGH (ref 0.00–0.07)
Basophils Absolute: 0 10*3/uL (ref 0.0–0.1)
Basophils Relative: 0 %
Eosinophils Absolute: 0 10*3/uL (ref 0.0–0.5)
Eosinophils Relative: 0 %
HCT: 44 % (ref 39.0–52.0)
Hemoglobin: 13.8 g/dL (ref 13.0–17.0)
Immature Granulocytes: 2 %
Lymphocytes Relative: 20 %
Lymphs Abs: 2 10*3/uL (ref 0.7–4.0)
MCH: 30 pg (ref 26.0–34.0)
MCHC: 31.4 g/dL (ref 30.0–36.0)
MCV: 95.7 fL (ref 80.0–100.0)
Monocytes Absolute: 1.6 10*3/uL — ABNORMAL HIGH (ref 0.1–1.0)
Monocytes Relative: 16 %
Neutro Abs: 6.1 10*3/uL (ref 1.7–7.7)
Neutrophils Relative %: 62 %
Platelets: 54 10*3/uL — ABNORMAL LOW (ref 150–400)
RBC: 4.6 MIL/uL (ref 4.22–5.81)
RDW: 14.4 % (ref 11.5–15.5)
WBC: 9.9 10*3/uL (ref 4.0–10.5)
nRBC: 0 % (ref 0.0–0.2)

## 2022-05-22 LAB — I-STAT ARTERIAL BLOOD GAS, ED
Acid-Base Excess: 1 mmol/L (ref 0.0–2.0)
Bicarbonate: 26.7 mmol/L (ref 20.0–28.0)
Calcium, Ion: 1.24 mmol/L (ref 1.15–1.40)
HCT: 38 % — ABNORMAL LOW (ref 39.0–52.0)
Hemoglobin: 12.9 g/dL — ABNORMAL LOW (ref 13.0–17.0)
O2 Saturation: 99 %
Potassium: 3.4 mmol/L — ABNORMAL LOW (ref 3.5–5.1)
Sodium: 139 mmol/L (ref 135–145)
TCO2: 28 mmol/L (ref 22–32)
pCO2 arterial: 44.2 mmHg (ref 32–48)
pH, Arterial: 7.389 (ref 7.35–7.45)
pO2, Arterial: 143 mmHg — ABNORMAL HIGH (ref 83–108)

## 2022-05-22 LAB — SAMPLE TO BLOOD BANK

## 2022-05-22 LAB — LACTIC ACID, PLASMA: Lactic Acid, Venous: 2.9 mmol/L (ref 0.5–1.9)

## 2022-05-22 LAB — PROTIME-INR
INR: 2.2 — ABNORMAL HIGH (ref 0.8–1.2)
Prothrombin Time: 24 seconds — ABNORMAL HIGH (ref 11.4–15.2)

## 2022-05-22 LAB — ETHANOL: Alcohol, Ethyl (B): <10 mg/dL (ref <10)

## 2022-05-22 MED ORDER — GLYCOPYRROLATE 0.2 MG/ML IJ SOLN
0.2000 mg | INTRAMUSCULAR | Status: DC | PRN
Start: 2022-05-22 — End: 2022-05-23
  Administered 2022-05-22 – 2022-05-23 (×3): 0.2 mg via INTRAVENOUS
  Filled 2022-05-22 (×3): qty 1

## 2022-05-22 MED ORDER — VITAMIN K1 10 MG/ML IJ SOLN
10.0000 mg | INTRAVENOUS | Status: AC
  Administered 2022-05-22: 10 mg via INTRAVENOUS
  Filled 2022-05-22: qty 1

## 2022-05-22 MED ORDER — GLYCOPYRROLATE 1 MG PO TABS: 1.0000 mg | ORAL_TABLET | ORAL | Status: DC | PRN

## 2022-05-22 MED ORDER — ONDANSETRON HCL 4 MG/2ML IJ SOLN: 4.0000 mg | Freq: Four times a day (QID) | INTRAMUSCULAR | Status: DC | PRN

## 2022-05-22 MED ORDER — ONDANSETRON 4 MG PO TBDP: 4.0000 mg | ORAL_TABLET | Freq: Four times a day (QID) | ORAL | Status: DC | PRN

## 2022-05-22 MED ORDER — IOHEXOL 350 MG/ML SOLN
100.0000 mL | Freq: Once | INTRAVENOUS | Status: AC | PRN
  Administered 2022-05-22: 100 mL via INTRAVENOUS

## 2022-05-22 MED ORDER — PROTHROMBIN COMPLEX CONC HUMAN 500 UNITS IV KIT
1546.0000 [IU] | PACK | Status: AC
  Administered 2022-05-22: 1546 [IU] via INTRAVENOUS
  Filled 2022-05-22: qty 1054

## 2022-05-22 MED ORDER — SODIUM CHLORIDE 0.9 % IV SOLN: INTRAVENOUS | Status: DC

## 2022-05-22 MED ORDER — MORPHINE SULFATE (PF) 2 MG/ML IV SOLN
2.0000 mg | INTRAVENOUS | Status: DC | PRN
  Administered 2022-05-22 (×5): 2 mg via INTRAVENOUS
  Filled 2022-05-22 (×5): qty 1

## 2022-05-22 MED ORDER — ACETAMINOPHEN 650 MG RE SUPP
650.0000 mg | Freq: Four times a day (QID) | RECTAL | Status: DC | PRN
  Administered 2022-05-24: 650 mg via RECTAL
  Filled 2022-05-22: qty 1

## 2022-05-22 MED ORDER — GLYCOPYRROLATE 0.2 MG/ML IJ SOLN: 0.2000 mg | INTRAMUSCULAR | Status: DC | PRN

## 2022-05-22 MED ORDER — ACETAMINOPHEN 325 MG PO TABS: 650.0000 mg | ORAL_TABLET | Freq: Four times a day (QID) | ORAL | Status: DC | PRN

## 2022-05-22 MED ORDER — MORPHINE SULFATE (PF) 4 MG/ML IV SOLN
4.0000 mg | INTRAVENOUS | Status: DC | PRN
  Administered 2022-05-22 – 2022-05-23 (×7): 4 mg via INTRAVENOUS
  Filled 2022-05-22 (×7): qty 1

## 2022-05-22 MED ORDER — POLYVINYL ALCOHOL 1.4 % OP SOLN
1.0000 [drp] | Freq: Four times a day (QID) | OPHTHALMIC | Status: DC | PRN
Start: 2022-05-22 — End: 2022-05-24

## 2022-05-22 NOTE — Progress Notes (Signed)
Trauma Event Note   TRN at bedside to round. Pt obtunded, mouth breathing on 6L [place]. Turned [place] off. Multiple family members at bedside, updated regarding bed status. Monitor to comfort care setting.  Last imported Vital Signs BP (!) 162/97   Pulse 96   Temp 97.6 F (36.4 C) (Temporal)   Resp 12   Ht '6\' 4"'$  (1.93 m)   Wt 169 lb 12.1 oz (77 kg)   SpO2 98%   BMI 20.66 kg/m   Trending CBC Recent Labs    06/14/2022 1002 05/23/2022 1022 06/03/2022 1052  WBC 9.9  --   --   HGB 13.8 14.6 12.9*  HCT 44.0 43.0 38.0*  PLT 54*  --   --     Trending Coag's Recent Labs    06/14/2022 1002  INR 2.2*    Trending BMET Recent Labs    05/20/2022 1002 06/06/2022 1022 05/28/2022 1052  NA 144 141 139  K 3.9 3.9 3.4*  CL 105 105  --   CO2 25  --   --   BUN 24* 25*  --   CREATININE 1.29* 1.10  --   GLUCOSE 192* 191*  --       Shalyn Koral O Jaeleah Smyser  Trauma Response RN  Please call TRN at [phone] for further assistance.

## 2022-05-22 NOTE — Progress Notes (Addendum)
Trauma Response Nurse Documentation   [name] [name] is a 88 y.o. male arriving to [facility] ED via EMS  On warfarin daily. Trauma was activated as a Level 2 by ED Charge RN based on the following trauma criteria Elderly patients > 65 with head trauma on anti-coagulation (excluding ASA). Trauma team at the bedside on patient arrival. Patient cleared for CT by Dr. Kathrynn Humble. Patient to CT with team. GCS 9.  History   Past Medical History:  Diagnosis Date   Abnormality of gait 05/27/2016   Adenomatous polyps    Carpal tunnel syndrome 06/25/2016   Right   Depressive disorder, not elsewhere classified    First degree AV block    Holter 3/18: NSR, PACs, PVCs, no AFib, no pauses.   Hereditary and idiopathic peripheral neuropathy 06/25/2016   Hypertension    Internal nasal lesion 05/15/2013   Melanoma ([place])    Left Shoulder   Mitral regurgitation    MVP (mitral valve prolapse)    a. With severe MR s/p Complex valvuloplasty including artificial Gore-tex neochord placement x4, chordal transposition x1, chordal release x1, # 32 mm Sorin Memo 3D Ring Annuloplasty 2012. // b. Echo 2/18: mild LVH, EF 50-55, mild AI, MV repair with mild MR, mod LAE, mod RVE, severe RAE, severe TR   Neuropathy    Normal coronary arteries    a. Normal coronary anatomy by cath 2012.   Osteoarthritis    Knees   PAF (paroxysmal atrial fibrillation) ([place])    a. Post-op MVR 2012.   Personal history of colonic polyps    Prostate cancer ([place])    Pulmonary HTN ([place])    a. Mild-mod by cath 2012.   Pure hypercholesterolemia    PVC (premature ventricular contraction)    Thrombocytopenia (HCC)    Vision abnormalities    Cornea scarring     Past Surgical History:  Procedure Laterality Date   CARDIAC CATHETERIZATION  09/2011   Pre-op for MVR -- normal coronaries.   CARDIOVERSION N/A 01/02/2016   Procedure: CARDIOVERSION;  Surgeon: Thayer Headings, MD;  Location: [facility];  Service: Cardiovascular;  Laterality: N/A;    COLONOSCOPY W/ POLYPECTOMY     INGUINAL HERNIA REPAIR  09/2009   Left   KNEE ARTHROSCOPY      left x3  and right x2   Melanoma Surgery     2001, 2005, 2006, 2009   MITRAL VALVE REPAIR  10/01/2011   complex valvuloplasty with Goretex cord replacement and chordal transposition 25m Sorin Memo 3D ring annuloplasty   Nuclear Stress Test  09/2006   EF-64%, Normal   PROSTATECTOMY  1993   RIGHT HEART CATH N/A 04/29/2017   Procedure: Right Heart Cath;  Surgeon: BJolaine Artist MD;  [facility];  Service: Cardiovascular;  Laterality: N/A;   ROOT CANAL  08-19-12   ROTATOR CUFF REPAIR  2003   left   TEE WITHOUT CARDIOVERSION  09/26/2011   Procedure: TRANSESOPHAGEAL ECHOCARDIOGRAM (TEE);  Surgeon: BLelon Perla MD;  [facility];  Service: Cardiovascular;  Laterality: N/A;   UKoreaECHOCARDIOGRAPHY  09/2009, 08/1011   mild LVH,mild AI,MVP with mild MR, mild-mod. TR with mild Pulm. HTN, EF-55-60%      Initial Focused Assessment (If applicable, or please see trauma documentation): - GCS 9 - Eyes open spontaneously, WD to pain on L side, no movement to pain on R side - L gaze - Large Lac/hematoma to R forehead - dried blood down face - L  hand lac - PERRLA, 2's reactive  CT's Completed:   CT Head, CT Maxillofacial, CT C-Spine, CT Chest w/ contrast, CT abdomen/pelvis w/ contrast, and CT Angio Head / neck   Interventions:  - Redressed head wound x2 - reversed coumadin w/ kcentra and vit k - suctioned airway - placed on NRB - Trauma labs - ABG - 18G to R brachial vein - 20G to R FA - CT pan scan including CTA of head and neck - CXR - Pelvic XR - pt logrolled  Plan for disposition:  Admission to floor - palliative   Consults completed:  Trauma @ 1025 Neurosurgeon at 1030. Palliative @ 1059  Event Summary: - Pt is from home; lives by himself.  Pt has homehealth care team who comes in the mornings.  Per pt's daughter, pt is not supposed to get out of  bed without assistance.  She states he mustve had to go to the BR.  Homehealth team found him down altered in his room with blood running down his face.  911 called.  [facility] brought in by [facility]. GCS currently 9.  Bedside handoff with ED RN Deneise Lever.    [name] [name]  Trauma Response RN  Please call TRN at [phone] for further assistance.

## 2022-05-22 NOTE — ED Provider Notes (Signed)
[facility] EMERGENCY DEPARTMENT Provider Note   CSN: [id number] Arrival date & time: 06/15/2022  [id number]     History  Chief Complaint  Patient presents with   Level 2 Fall   Trauma    [name] [name] is a 88 y.o. male with a history of atrial fibrillation anticoagulated on Coumadin, hypertension, prostate cancer, and hypercholesterolemia who presents to the emergency department as a level 2 trauma.  Patient not consistently following commands or speaking in complete sentences, level 5 caveat applies secondary to altered mental status and acuity of condition.  On arrival history is primarily provided by EMS, per their report they received a call from patient's caregiver as when the caregiver arrived to the house they found the patient on the ground with blood to his head/face.  On their arrival patient altered.  Per caregiver report patient is typically with it and communicates appropriately.  Unknown last known normal per EMS.  HPI     Home Medications Prior to Admission medications   Medication Sig Start Date End Date Taking? Authorizing Provider  acetaminophen (TYLENOL) 500 MG tablet Take 1,000 mg by mouth every 6 (six) hours as needed for mild pain.    [provider]  amiodarone (PACERONE) 200 MG tablet TAKE ONE-HALF TABLET BY  MOUTH DAILY 06/25/21   Bensimhon, Shaune Pascal, MD  b complex vitamins tablet Take 1 tablet by mouth daily.    [provider]  cycloSPORINE (RESTASIS) 0.05 % ophthalmic emulsion Place 1 drop into both eyes 2 (two) times daily.    [provider]  denosumab (XGEVA) 120 MG/1.7ML SOLN injection Inject 120 mg into the skin every 3 (three) months.    [provider]  fluticasone (FLONASE) 50 MCG/ACT nasal spray Place 2 sprays into both nostrils daily.  04/28/18   [provider]  gabapentin (NEURONTIN) 300 MG capsule TAKE 1 CAPSULE BY MOUTH  TWICE DAILY 01/13/22   Magnus Sinning, MD  leuprolide, 6 Month,  (ELIGARD) 45 MG injection Inject 45 mg into the skin every 6 (six) months.    [provider]  Multiple Vitamin (MULTIVITAMIN WITH MINERALS) TABS tablet Take 1 tablet by mouth every evening.     [provider]  rosuvastatin (CRESTOR) 5 MG tablet Take 1 tablet (5 mg total) by mouth daily. NEEDS FOLLOW UP APPOINTMENT FOR ANYMORE REFILLS 05/06/22   [name], MD  torsemide (DEMADEX) 20 MG tablet Take 2 tablets (40 mg total) by mouth daily. NEEDS FOLLOW UP APPOINTMENT FOR ANYMORE REFILLS 05/05/22   Bensimhon, Shaune Pascal, MD  traZODone (DESYREL) 100 MG tablet Take 200 mg by mouth at bedtime.    [provider]  warfarin (COUMADIN) 5 MG tablet TAKE 1/2 TABLET BY MOUTH EVERY DAY OR AS DIRECTED BY ANTICOAGULATION CLINIC 02/07/22   Bensimhon, Shaune Pascal, MD  pantoprazole (PROTONIX) 40 MG tablet Take 1 tablet (40 mg total) by mouth daily before breakfast. 10/16/11 06/29/19  Nani Skillern, PA-C      Allergies    Patient has no known allergies.    Review of Systems   Review of Systems  Unable to perform ROS: Mental status change    Physical Exam Updated Vital Signs BP (!) 160/91   Pulse 89   Temp 97.6 F (36.4 C) (Temporal)   Resp (!) 24   Ht '6\' 4"'$  (1.93 m)   Wt 77 kg   SpO2 100%   BMI 20.66 kg/m  Physical Exam Vitals and nursing note reviewed.  Constitutional:      Appearance: He is ill-appearing.  HENT:     Head:     Comments: Right frontal scalp hematoma, additional hematoma to left temporal/parietal region. Mild ooze bleeding from frontal scalp hematoma. Dried blood to face. No active epistaxis.  Eyes:     Comments: Pupils are equal round and reactive.  Patient has left deviated gaze.  Neck:     Comments: C-collar in place and maintained throughout assessment. Cardiovascular:     Rate and Rhythm: Rhythm irregularly irregular.     Pulses:          Dorsalis pedis pulses are 2+ on the right side and 2+ on the left side.  Pulmonary:     Comments:  Decreased breath sounds bilaterally. SPO2 86% on room air. Chest:     Chest wall: No tenderness or crepitus.  Abdominal:     Tenderness: There is no abdominal tenderness. There is no guarding or rebound.  Musculoskeletal:     Comments: Left hand currently in bandage.  Bruising to the left shoulder.  No focal bony tenderness to the extremities.  No palpable step-off of the spine with roll w/ c spine precautions.   Neurological:     Mental Status: He is alert.     Comments: Eyes open, moaning. Gaze deviated to the left, right-sided weakness     ED Results / Procedures / Treatments   Labs (all labs ordered are listed, but only abnormal results are displayed) Labs Reviewed  COMPREHENSIVE METABOLIC PANEL - Abnormal; Notable for the following components:      Result Value   Glucose, Bld 192 (*)    BUN 24 (*)    Creatinine, Ser 1.29 (*)    Total Bilirubin 1.3 (*)    GFR, Estimated 53 (*)    All other components within normal limits  LACTIC ACID, PLASMA - Abnormal; Notable for the following components:   Lactic Acid, Venous 2.9 (*)    All other components within normal limits  PROTIME-INR - Abnormal; Notable for the following components:   Prothrombin Time 24.0 (*)    INR 2.2 (*)    All other components within normal limits  CBC WITH DIFFERENTIAL/PLATELET - Abnormal; Notable for the following components:   Platelets 54 (*)    Monocytes Absolute 1.6 (*)    Abs Immature Granulocytes 0.18 (*)    All other components within normal limits  I-STAT CHEM 8, ED - Abnormal; Notable for the following components:   BUN 25 (*)    Glucose, Bld 191 (*)    Calcium, Ion 1.12 (*)    All other components within normal limits  I-STAT ARTERIAL BLOOD GAS, ED - Abnormal; Notable for the following components:   pO2, Arterial 143 (*)    Potassium 3.4 (*)    HCT 38.0 (*)    Hemoglobin 12.9 (*)    All other components within normal limits  RESP PANEL BY RT-PCR (FLU A&B, COVID) ARPGX2  ETHANOL   URINALYSIS, ROUTINE W REFLEX MICROSCOPIC  SAMPLE TO BLOOD BANK    EKG None  Radiology CT CHEST ABDOMEN PELVIS W CONTRAST  Result Date: 05/21/2022 CLINICAL DATA:  Level 2 trauma, fall, on blood thinners; past history prostate cancer, hypertension, minimally indent melanoma, CHF, atrial fibrillation EXAM: CT CHEST, ABDOMEN, AND PELVIS WITH CONTRAST TECHNIQUE: Multidetector CT imaging of the chest, abdomen and pelvis was performed following the standard protocol during bolus administration of intravenous contrast. RADIATION DOSE REDUCTION: This exam was performed according to  the departmental dose-optimization program which includes automated exposure control, adjustment of the mA and/or kV according to patient size and/or use of iterative reconstruction technique. CONTRAST:  176m OMNIPAQUE IOHEXOL 350 MG/ML SOLN IV. No oral contrast. COMPARISON:  PET-CT 10/23/2021 FINDINGS: CT CHEST FINDINGS Cardiovascular: Atherosclerotic calcifications aorta without aneurysm. Enlargement of cardiac chambers. Post MVR. No pericardial effusion. Mediastinum/Nodes: Esophagus unremarkable. Base of cervical region normal appearance. No thoracic adenopathy. Lungs/Pleura: Dependent atelectasis in both lungs. Small LEFT pleural effusion. Pleural thickening LEFT chest. Calcified pleural plaques bilaterally. No pulmonary infiltrate or pneumothorax. Musculoskeletal: Costal bar anterior RIGHT fourth and fifth ribs. Prior median sternotomy. No acute fractures. CT ABDOMEN PELVIS FINDINGS Hepatobiliary: Minimal periportal edema. Numerous tiny cysts again identified within liver. Additional nonspecific 15 x 10 mm low-attenuation lesion superiorly RIGHT lobe liver image 53. Gallbladder distended with minimal adjacent fluid. No calcified gallstones visualized. Pancreas: Atrophic pancreas without mass Spleen: Normal appearance Adrenals/Urinary Tract: Adrenal glands normal appearance. Mild renal cortical thinning. LEFT renal cyst 2.3 x 2.2  cm image 76 unchanged. 11 mm RIGHT renal cyst unchanged. No additional renal mass or hydronephrosis. No ureteral calcification or dilatation. Mildly thickened bladder wall with small bladder diverticula. Stomach/Bowel: Small hiatal hernia. Remainder of stomach unremarkable. Increased stool in rectosigmoid colon. Diverticulosis of descending and sigmoid colon without evidence of diverticulitis. Normal appendix. Questionable rectal wall thickening anteriorly. Remaining bowel loops unremarkable. Vascular/Lymphatic: Atherosclerotic calcifications aorta and iliac arteries. Aorta normal caliber. Vascular structures patent. No adenopathy. Upper normal sized RIGHT external iliac lymph node image 120 unchanged. Reproductive: Post prostatectomy Other: Small BILATERAL inguinal hernias containing fat. No free air or free fluid. Soft tissue mass within mesentery in central upper pelvis 3.3 x 2.1 cm, previously 2.6 x 1.8 cm, likely metastatic disease. Musculoskeletal: Osseous demineralization. Sclerotic focus at LEFT pubic body suspicious for sclerotic metastasis unchanged. Chronic inferior endplate deformity L1 likely large Schmorl's node. IMPRESSION: No acute intrathoracic, intra-abdominal, or intrapelvic injuries identified. Distal colonic diverticulosis without evidence of diverticulitis. Questionable rectal wall thickening anteriorly, recommend correlation with digital rectal exam and proctoscopy. Hepatic and renal cysts with a nonspecific 15 x 10 mm lesion superiorly RIGHT lobe liver, new since prior exam, cannot exclude metastatic disease Small BILATERAL inguinal hernias containing fat. 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 Enlargement of cardiac chambers post MVR. Chronic small LEFT pleural effusion and pleural thickening. Aortic Atherosclerosis (ICD10-I70.0). Electronically Signed   By: MLavonia DanaM.D.   On: 06/05/2022 11:24   CT ANGIO HEAD NECK W WO CM  Result Date:  06/01/2022 CLINICAL DATA:  Fall on anticoagulation EXAM: CT ANGIOGRAPHY HEAD AND NECK TECHNIQUE: Multidetector CT imaging of the head and neck was performed using the standard protocol during bolus administration of intravenous contrast. Multiplanar CT image reconstructions and MIPs were obtained to evaluate the vascular anatomy. Carotid stenosis measurements (when applicable) are obtained utilizing NASCET criteria, using the distal internal carotid diameter as the denominator. RADIATION DOSE REDUCTION: This exam was performed according to the departmental dose-optimization program which includes automated exposure control, adjustment of the mA and/or kV according to patient size and/or use of iterative reconstruction technique. CONTRAST:  1073mOMNIPAQUE IOHEXOL 350 MG/ML SOLN COMPARISON:  Head CT 12/25/2021 FINDINGS: CT HEAD Brain: Large parenchymal hemorrhage involving most of the left occipital lobe with extension into temporal and parietal lobes. There is adjacent sulcal subarachnoid hemorrhage. Intraventricular extension is present. There is regional mass effect as well as subfalcine herniation with rightward midline shift and central herniation with partial effacement of  basilar cisterns. Trapping of the right lateral ventricle. Midline shift measures approximately 1.5 cm. Gray-white differentiation is preserved. Vascular: Better evaluated on CTA portion. Skull: Calvarium is unremarkable. Sinuses/Orbits: No acute finding. Other: Large right scalp hematoma. Maxillofacial findings dictated separately. Review of the MIP images confirms the above findings CTA NECK Aortic arch: Mild calcified plaque. Great vessel origins are patent. Right carotid system: Patent. Mild calcified plaque at the ICA origin. No stenosis. Left carotid system: Patent. Calcified plaque at the bifurcation and proximal ICA with less than 50% stenosis. Vertebral arteries: Patent. Left vertebral arises directly from the arch. Vessels are  codominant. No stenosis. Skeleton: Cervical spine and face dictated separately. Other neck: Unremarkable. Upper chest: Chest dictated separately. Review of the MIP images confirms the above findings CTA HEAD Suboptimal arterial evaluation due to venous enhancement. Anterior circulation: Intracranial internal carotid arteries are patent with calcified plaque but no significant stenosis. Anterior and middle cerebral arteries are patent. There is a 2 mm rounded area of enhancement within the parenchymal hemorrhage (series 12, image 109). Posterior circulation: Intracranial vertebral arteries, basilar artery, and posterior cerebral arteries are patent. Venous sinuses: Patent as allowed by contrast bolus timing. Review of the MIP images confirms the above findings IMPRESSION: Large parenchymal hemorrhage in the left occipital lobe extending into temporal and parietal lobes. Adjacent sulcal subarachnoid hemorrhage. Significant mass effect is present including subfalcine herniation with 1.5 cm of midline shift as well as central herniation with partial effacement of basal cisterns. There is trapping of the right lateral ventricle. No evidence of arterial injury in the neck. A 2 mm focus of enhancement within the parenchymal hemorrhage could reflect a pseudoaneurysm or incidental conspicuous vessel. These results were called by telephone at the time of interpretation on 05/29/2022 at 10:58 am to provider [facility] , who verbally acknowledged these results. Electronically Signed   By: Macy Mis M.D.   On: 05/28/2022 11:00   CT C-SPINE NO CHARGE  Result Date: 05/17/2022 CLINICAL DATA:  Polytrauma, blunt neck pain EXAM: CT CERVICAL SPINE WITHOUT CONTRAST TECHNIQUE: Multidetector CT imaging of the cervical spine was performed without intravenous contrast. Multiplanar CT image reconstructions were also generated. RADIATION DOSE REDUCTION: This exam was performed according to the departmental dose-optimization  program which includes automated exposure control, adjustment of the mA and/or kV according to patient size and/or use of iterative reconstruction technique. COMPARISON:  Correlation is made with MRI of the cervical spine dated July 06, 2016 FINDINGS: Alignment: Normal. Skull base and vertebrae: No acute fracture. No primary bone lesion or focal pathologic process. Mild-to-moderate spondylosis and discogenic degenerative changes with prominent marginal osteophytes. Soft tissues and spinal canal: No prevertebral fluid or swelling. No visible canal hematoma. Disc levels: Mild-to-moderate narrowing of the intervertebral disc spaces at C3-C4, C5-C6 and C6-C7 on the basis of degenerative changes. Upper chest: Negative. Other: None. IMPRESSION: No fracture or dislocation. Mild-to-moderate cervical spondylosis with discogenic degenerative changes. Electronically Signed   By: Frazier Richards M.D.   On: 06/03/2022 10:57   CT MAXILLOFACIAL WO CONTRAST  Result Date: 05/20/2022 CLINICAL DATA:  Provided history: Neuro deficit, acute, stroke suspected. Level 2 trauma, fall on blood thinners. EXAM: CT MAXILLOFACIAL WITHOUT CONTRAST TECHNIQUE: Multidetector CT imaging of the maxillofacial structures was performed. Multiplanar CT image reconstructions were also generated. RADIATION DOSE REDUCTION: This exam was performed according to the departmental dose-optimization program which includes automated exposure control, adjustment of the mA and/or kV according to patient size and/or use of iterative reconstruction technique. COMPARISON:  Concurrently performed non-contrast head CT and CTA head/neck. Head CT 12/25/2021. FINDINGS: Osseous: No evidence of acute maxillofacial fracture. Orbits: No acute orbital finding. Sinuses: Mild mucosal thickening within the frontoethmoidal recesses and anterior ethmoid air cells, bilaterally. Trace vessel thickening within the bilateral sphenoid and maxillary sinuses. Soft tissues: Forehead and  right facial soft tissue swelling/hematoma. Limited intracranial: Reported on concurrently performed CTA head/neck. IMPRESSION: 1. No evidence of acute maxillofacial fracture. 2. Forehead and right facial soft tissue swelling/hematoma. 3. Mild paranasal sinus disease, as described. Electronically Signed   By: Kellie Simmering D.O.   On: 06/13/2022 10:54   DG Pelvis Portable  Result Date: 06/08/2022 CLINICAL DATA:  Fall EXAM: PORTABLE PELVIS 1-2 VIEWS COMPARISON:  None Available. FINDINGS: No acute fracture or pelvic ring diastasis. Both hips appear located. Rectal stool distention to 10 cm. Postoperative right groin and pelvis. IMPRESSION: 1. No acute finding. 2. Stool distended rectum. Electronically Signed   By: Jorje Guild M.D.   On: 06/11/2022 10:24   DG Chest Port 1 View  Result Date: 05/29/2022 CLINICAL DATA:  Found down by caregiver EXAM: PORTABLE CHEST 1 VIEW COMPARISON:  05/31/2021 FINDINGS: Cardiomegaly and vascular pedicle widening. Scarring at the left lung base with lateral costophrenic sulcus blunting. Widening of the vascular pedicle and left apical pleural based thickening. No definite edema. No pneumothorax. These results were called by telephone at the time of interpretation on 05/26/2022 at 10:23 am to provider [facility] , who verbally acknowledged these results. IMPRESSION: 1. Vascular pedicle widening and left apical pleural based thickening could be from supine positioning and pleural fluid but recommend chest CT to exclude acute aortic process. 2. Chronic cardiomegaly and left pleuroparenchymal scarring. Electronically Signed   By: Jorje Guild M.D.   On: 06/06/2022 10:23    Procedures .Critical Care  Performed by: Amaryllis Dyke, PA-C Authorized by: Amaryllis Dyke, PA-C     CRITICAL CARE Performed by: Kennith Maes  Total critical care time: 45 minutes  Critical care time was exclusive of separately billable procedures and treating other  patients.  Critical care was necessary to treat or prevent imminent or life-threatening deterioration.  Critical care was time spent personally by me on the following activities: development of treatment plan with patient and/or surrogate as well as nursing, discussions with consultants, evaluation of patient's response to treatment, examination of patient, obtaining history from patient or surrogate, ordering and performing treatments and interventions, ordering and review of laboratory studies, ordering and review of radiographic studies, pulse oximetry and re-evaluation of patient's condition.    Medications Ordered in ED Medications  glycopyrrolate (ROBINUL) tablet 1 mg ( Oral See Alternative 05/25/2022 1157)    Or  glycopyrrolate (ROBINUL) injection 0.2 mg ( Subcutaneous See Alternative 06/03/2022 1157)    Or  glycopyrrolate (ROBINUL) injection 0.2 mg (0.2 mg Intravenous Given 05/27/2022 1157)  morphine (PF) 2 MG/ML injection 2 mg (2 mg Intravenous Given 05/29/2022 1245)  morphine (PF) 4 MG/ML injection 4 mg (has no administration in time range)  ondansetron (ZOFRAN-ODT) disintegrating tablet 4 mg (has no administration in time range)    Or  ondansetron (ZOFRAN) injection 4 mg (has no administration in time range)  prothrombin complex conc human (KCENTRA) IVPB 1,546 Units (0 Units Intravenous Stopped 06/16/2022 1107)  phytonadione (VITAMIN K) 10 mg in dextrose 5 % 50 mL IVPB (0 mg Intravenous Stopped 05/19/2022 1107)  iohexol (OMNIPAQUE) 350 MG/ML injection 100 mL (100 mLs Intravenous Contrast Given 05/25/2022 1030)    ED Course/  Medical Decision Making/ A&P                           Medical Decision Making Amount and/or Complexity of Data Reviewed Labs: ordered. Radiology: ordered.  Risk Prescription drug management. Decision regarding hospitalization.   Patient presents to the emergency department as a level 2 trauma as a fall on blood thinners.  On arrival patient is hypoxic, placed on  nonrebreather mask with improvement, not hypotensive at this time.  Obvious head trauma, multiple scalp hematomas, additional gaze deviated left with right sided weakness.  Concern for bleed versus ischemic CVA, at this time unknown last known normal therefore no code stroke activated.  Prioritize imaging.   Additional history obtained:  Chart/nursing notes reviewed.  Last INR and renal function viewed.  Discussion w/ EMS.   Following patient arrival his daughter arrived, she reports that his last known normal was 8:30PM when a caregiver put him to bed last night.  He is not supposed to get out of bed on his own, she suspect he tried to which is what led to his fall.  She states at baseline he is alert and oriented, very sharp, however he is generally weak with significant decline of his physical function/mobility.    Imaging:  I ordered, viewed & interpreted all imaging, agree with radiologist impressions as below:  Portable chest xray: 1. Vascular pedicle widening and left apical pleural based thickening could be from supine positioning and pleural fluid but recommend chest CT to exclude acute aortic process. 2. Chronic cardiomegaly and left pleuroparenchymal scarring---> I discussed w/radiologist, CT chest w/ contrast sufficient for further aortic assessment.  Pelvis xray: 1. No acute finding. 2. Stool distended rectum  I accompanied patient and nursing staff to CT scanner, quickly viewed CT head without contrast in real time and interpreted obvious large left-sided bleed.  Alerted attending who subsequently placed consult to neurosurgery. Started reversal of his coumadin per pharmacy in radiology.   Dr. Kathrynn Humble spoke with neurosurgeon Dr. Ellene Route.   CT head/cspine + CTAs: Large parenchymal hemorrhage in the left occipital lobe extending into temporal and parietal lobes. Adjacent sulcal subarachnoid hemorrhage. Significant mass effect is present including subfalcine herniation with 1.5 cm of  midline shift as well as central herniation with partial effacement of basal cisterns. There is trapping of the right lateral ventricle. No evidence of arterial injury in the neck. A 2 mm focus of enhancement within the parenchymal hemorrhage could reflect a pseudoaneurysm or incidental conspicuous vessel.  CT maxillofacial: 1. No evidence of acute maxillofacial fracture. 2. Forehead and right facial soft tissue swelling/hematoma. 3. Mild paranasal sinus disease, as described.  CT chest/abdomen/pelvis: no obvious traumatic injury- additional findings as above.   Labs:  I ordered, viewed, & interpreted labs below:  CBC: Thrombocytopenia CMP: Mild elevation in BUN and creatinine Ethanol level: Negative Lactic acid: Elevated  Dr. Kathrynn Humble and I had a conversation with the patient's daughter regarding goals of care and management, ultimately his daughter has made the decision to proceed with comfort measures, admitted to trauma service by Dr. Grandville Silos, palliative care consult placed.   Trauma service has evaluated the patient and placed orders for admission.  This is a shared visit with supervising physician Dr. Tammy Sours who has independently evaluated patient & provided guidance in evaluation/management/disposition, in agreement with care   Portions of this note were generated with Dragon dictation software. Dictation errors may occur despite best attempts at proofreading.  Final Clinical Impression(s) / ED Diagnoses Final diagnoses:  Fall, initial encounter  Hypoxia  Intracranial hemorrhage [facility])    Rx / DC Orders ED Discharge Orders     None         Amaryllis Dyke, PA-C 05/19/2022 Ragan, Ankit, MD 14-Jun-2022 1515

## 2022-05-22 NOTE — Progress Notes (Signed)
Orthopedic Tech Progress Note Patient Details:  SHARON STAPEL February 09, 1931 [id number]  Level 2 trauma, ortho tech services were not needed at this moment.  Patient ID: KINSLEY NICKLAUS, male   DOB: [birth year], 88 y.o.   MRN: [id number]  Carin Primrose 05/18/2022, 10:29 AM

## 2022-05-22 NOTE — Progress Notes (Signed)
Responded to level 2 page to support patient and staff. Pt. Experienced fall. Per ems patient was found on floor. Pt has several abrasions.  Currently being assessed by staff. Provide support to pt and staff. Chaplain available as needed.  Jaclynn Major, [place], [place], Pager [phone]

## 2022-05-22 NOTE — TOC CAGE-AID Note (Signed)
Transition of Care [facility]) - CAGE-AID Screening   Patient Details  Name: [name] [name] MRN: [id number] Date of Birth: Mar 07, 1931  Transition of Care [facility]) CM/SW Contact:    [name], RN Phone Number: 06/12/2022, 1:54 PM   Clinical Narrative: Pt here after having a GLF.  Pt GCS of 9 on arrival and nonverbal.  Therefore unable to complete assessment.   CAGE-AID Screening: Substance Abuse Screening unable to be completed due to: : Patient unable to participate

## 2022-05-22 NOTE — H&P (Signed)
[name] [name] is an 88 y.o. male.   Chief Complaint: fall with altered MS HPI: 28uo M with multiple medical problems as listed below on Coumadin for PAF and valve replacement lives alone but has caregivers that come to the house.  He did not like the management as usual this morning.  They suspected something was wrong and investigated.  He had fallen in the house and may have been down for like an hour.  He was brought in as a level 2 trauma as a fall on blood thinners with altered mental status.  GCS 9 on arrival.  Past Medical History:  Diagnosis Date   Abnormality of gait 05/27/2016   Adenomatous polyps    Carpal tunnel syndrome 06/25/2016   Right   Depressive disorder, not elsewhere classified    First degree AV block    Holter 3/18: NSR, PACs, PVCs, no AFib, no pauses.   Hereditary and idiopathic peripheral neuropathy 06/25/2016   Hypertension    Internal nasal lesion 05/15/2013   Melanoma ([place])    Left Shoulder   Mitral regurgitation    MVP (mitral valve prolapse)    a. With severe MR s/p Complex valvuloplasty including artificial Gore-tex neochord placement x4, chordal transposition x1, chordal release x1, # 32 mm Sorin Memo 3D Ring Annuloplasty 2012. // b. Echo 2/18: mild LVH, EF 50-55, mild AI, MV repair with mild MR, mod LAE, mod RVE, severe RAE, severe TR   Neuropathy    Normal coronary arteries    a. Normal coronary anatomy by cath 2012.   Osteoarthritis    Knees   PAF (paroxysmal atrial fibrillation) ([place])    a. Post-op MVR 2012.   Personal history of colonic polyps    Prostate cancer ([place])    Pulmonary HTN ([place])    a. Mild-mod by cath 2012.   Pure hypercholesterolemia    PVC (premature ventricular contraction)    Thrombocytopenia ([facility])    Vision abnormalities    Cornea scarring    Past Surgical History:  Procedure Laterality Date   CARDIAC CATHETERIZATION  09/2011   Pre-op for MVR -- normal coronaries.   CARDIOVERSION N/A 01/02/2016   Procedure: CARDIOVERSION;   Surgeon: Thayer Headings, MD;  Location: [place];  Service: Cardiovascular;  Laterality: N/A;   COLONOSCOPY W/ POLYPECTOMY     INGUINAL HERNIA REPAIR  09/2009   Left   KNEE ARTHROSCOPY      left x3  and right x2   Melanoma Surgery     2001, 2005, 2006, 2009   MITRAL VALVE REPAIR  10/01/2011   complex valvuloplasty with Goretex cord replacement and chordal transposition 82m Sorin Memo 3D ring annuloplasty   Nuclear Stress Test  09/2006   EF-64%, Normal   PROSTATECTOMY  1993   RIGHT HEART CATH N/A 04/29/2017   Procedure: Right Heart Cath;  Surgeon: BJolaine Artist MD;  [facility];  Service: Cardiovascular;  Laterality: N/A;   ROOT CANAL  08-19-12   ROTATOR CUFF REPAIR  2003   left   TEE WITHOUT CARDIOVERSION  09/26/2011   Procedure: TRANSESOPHAGEAL ECHOCARDIOGRAM (TEE);  Surgeon: BLelon Perla MD;  [facility];  Service: Cardiovascular;  Laterality: N/A;   UKoreaECHOCARDIOGRAPHY  09/2009, 08/1011   mild LVH,mild AI,MVP with mild MR, mild-mod. TR with mild Pulm. HTN, EF-55-60%    Family History  Problem Relation Age of Onset   Clotting disorder Brother        CVA's   Arthritis  Mother    Hypertension Mother    Stroke Mother    Hypertension Father    Psychosis Father        psychiatric care   Colon cancer Neg Hx    Stomach cancer Neg Hx    Heart attack Neg Hx    Prostate cancer Neg Hx    Pancreatic cancer Neg Hx    Social History:  reports that he has never smoked. He has never used smokeless tobacco. He reports that he does not drink alcohol and does not use drugs.  Allergies: No Known Allergies  (Not in a hospital admission)   Results for orders placed or performed during the hospital encounter of 05/17/2022 (from the past 48 hour(s))  Protime-INR     Status: Abnormal   Collection Time: 06/11/2022 10:02 AM  Result Value Ref Range   Prothrombin Time 24.0 (H) 11.4 - 15.2 seconds   INR 2.2 (H) 0.8 - 1.2    Comment: (NOTE) INR goal  varies based on device and disease states. Performed at [facility], [place] [address], [place], [place] [address]   Sample to Blood Bank     Status: None   Collection Time: 05/26/2022 10:02 AM  Result Value Ref Range   Blood Bank Specimen SAMPLE AVAILABLE FOR TESTING    Sample Expiration      05/23/2022,2359 Performed at [facility], [place] [address] [place], [place] [address]   CBC with Differential     Status: Abnormal   Collection Time: 06/11/2022 10:02 AM  Result Value Ref Range   WBC 9.9 4.0 - 10.5 K/uL   RBC 4.60 4.22 - 5.81 MIL/uL   Hemoglobin 13.8 13.0 - 17.0 g/dL   HCT 44.0 39.0 - 52.0 %   MCV 95.7 80.0 - 100.0 fL   MCH 30.0 26.0 - 34.0 pg   MCHC 31.4 30.0 - 36.0 g/dL   RDW 14.4 11.5 - 15.5 %   Platelets 54 (L) 150 - 400 K/uL    Comment: Immature Platelet Fraction may be clinically indicated, consider ordering this additional test [id number] REPEATED TO VERIFY    nRBC 0.0 0.0 - 0.2 %   Neutrophils Relative % 62 %   Neutro Abs 6.1 1.7 - 7.7 K/uL   Lymphocytes Relative 20 %   Lymphs Abs 2.0 0.7 - 4.0 K/uL   Monocytes Relative 16 %   Monocytes Absolute 1.6 (H) 0.1 - 1.0 K/uL   Eosinophils Relative 0 %   Eosinophils Absolute 0.0 0.0 - 0.5 K/uL   Basophils Relative 0 %   Basophils Absolute 0.0 0.0 - 0.1 K/uL   Immature Granulocytes 2 %   Abs Immature Granulocytes 0.18 (H) 0.00 - 0.07 K/uL    Comment: Performed at [place] [address] [place], [place] [address]  I-Stat Chem 8, ED     Status: Abnormal   Collection Time: 06/15/2022 10:22 AM  Result Value Ref Range   Sodium 141 135 - 145 mmol/L   Potassium 3.9 3.5 - 5.1 mmol/L   Chloride 105 98 - 111 mmol/L   BUN 25 (H) 8 - 23 mg/dL   Creatinine, Ser 1.10 0.61 - 1.24 mg/dL   Glucose, Bld 191 (H) 70 - 99 mg/dL    Comment: Glucose reference range applies only to samples taken after fasting for at least 8 hours.   Calcium, Ion 1.12 (L) 1.15 - 1.40 mmol/L   TCO2 25 22 - 32 mmol/L   Hemoglobin 14.6  13.0 - 17.0 g/dL   HCT 43.0 39.0 - 52.0 %  I-Stat arterial blood gas, ED [facility] ED only)     Status: Abnormal   Collection Time: 06/07/2022 10:52 AM  Result Value Ref Range   pH, Arterial 7.389 7.35 - 7.45   pCO2 arterial 44.2 32 - 48 mmHg   pO2, Arterial 143 (H) 83 - 108 mmHg   Bicarbonate 26.7 20.0 - 28.0 mmol/L   TCO2 28 22 - 32 mmol/L   O2 Saturation 99 %   Acid-Base Excess 1.0 0.0 - 2.0 mmol/L   Sodium 139 135 - 145 mmol/L   Potassium 3.4 (L) 3.5 - 5.1 mmol/L   Calcium, Ion 1.24 1.15 - 1.40 mmol/L   HCT 38.0 (L) 39.0 - 52.0 %   Hemoglobin 12.9 (L) 13.0 - 17.0 g/dL   Collection site RADIAL, ALLEN'S TEST ACCEPTABLE    Drawn by RT    Sample type ARTERIAL    *Note: Due to a large number of results and/or encounters for the requested time period, some results have not been displayed. A complete set of results can be found in Results Review.   CT ANGIO HEAD NECK W WO CM  Result Date: 05/25/2022 CLINICAL DATA:  Fall on anticoagulation EXAM: CT ANGIOGRAPHY HEAD AND NECK TECHNIQUE: Multidetector CT imaging of the head and neck was performed using the standard protocol during bolus administration of intravenous contrast. Multiplanar CT image reconstructions and MIPs were obtained to evaluate the vascular anatomy. Carotid stenosis measurements (when applicable) are obtained utilizing NASCET criteria, using the distal internal carotid diameter as the denominator. RADIATION DOSE REDUCTION: This exam was performed according to the departmental dose-optimization program which includes automated exposure control, adjustment of the mA and/or kV according to patient size and/or use of iterative reconstruction technique. CONTRAST:  131m OMNIPAQUE IOHEXOL 350 MG/ML SOLN COMPARISON:  Head CT 12/25/2021 FINDINGS: CT HEAD Brain: Large parenchymal hemorrhage involving most of the left occipital lobe with extension into temporal and parietal lobes. There is adjacent sulcal subarachnoid hemorrhage. Intraventricular  extension is present. There is regional mass effect as well as subfalcine herniation with rightward midline shift and central herniation with partial effacement of basilar cisterns. Trapping of the right lateral ventricle. Midline shift measures approximately 1.5 cm. Gray-white differentiation is preserved. Vascular: Better evaluated on CTA portion. Skull: Calvarium is unremarkable. Sinuses/Orbits: No acute finding. Other: Large right scalp hematoma. Maxillofacial findings dictated separately. Review of the MIP images confirms the above findings CTA NECK Aortic arch: Mild calcified plaque. Great vessel origins are patent. Right carotid system: Patent. Mild calcified plaque at the ICA origin. No stenosis. Left carotid system: Patent. Calcified plaque at the bifurcation and proximal ICA with less than 50% stenosis. Vertebral arteries: Patent. Left vertebral arises directly from the arch. Vessels are codominant. No stenosis. Skeleton: Cervical spine and face dictated separately. Other neck: Unremarkable. Upper chest: Chest dictated separately. Review of the MIP images confirms the above findings CTA HEAD Suboptimal arterial evaluation due to venous enhancement. Anterior circulation: Intracranial internal carotid arteries are patent with calcified plaque but no significant stenosis. Anterior and middle cerebral arteries are patent. There is a 2 mm rounded area of enhancement within the parenchymal hemorrhage (series 12, image 109). Posterior circulation: Intracranial vertebral arteries, basilar artery, and posterior cerebral arteries are patent. Venous sinuses: Patent as allowed by contrast bolus timing. Review of the MIP images confirms the above findings IMPRESSION: Large parenchymal hemorrhage in the left occipital lobe extending into temporal and parietal lobes. Adjacent  sulcal subarachnoid hemorrhage. Significant mass effect is present including subfalcine herniation with 1.5 cm of midline shift as well as central  herniation with partial effacement of basal cisterns. There is trapping of the right lateral ventricle. No evidence of arterial injury in the neck. A 2 mm focus of enhancement within the parenchymal hemorrhage could reflect a pseudoaneurysm or incidental conspicuous vessel. These results were called by telephone at the time of interpretation on 05/19/2022 at 10:58 am to provider [facility] , who verbally acknowledged these results. Electronically Signed   By: Macy Mis M.D.   On: 05/23/2022 11:00   CT C-SPINE NO CHARGE  Result Date: 06/09/2022 CLINICAL DATA:  Polytrauma, blunt neck pain EXAM: CT CERVICAL SPINE WITHOUT CONTRAST TECHNIQUE: Multidetector CT imaging of the cervical spine was performed without intravenous contrast. Multiplanar CT image reconstructions were also generated. RADIATION DOSE REDUCTION: This exam was performed according to the departmental dose-optimization program which includes automated exposure control, adjustment of the mA and/or kV according to patient size and/or use of iterative reconstruction technique. COMPARISON:  Correlation is made with MRI of the cervical spine dated July 06, 2016 FINDINGS: Alignment: Normal. Skull base and vertebrae: No acute fracture. No primary bone lesion or focal pathologic process. Mild-to-moderate spondylosis and discogenic degenerative changes with prominent marginal osteophytes. Soft tissues and spinal canal: No prevertebral fluid or swelling. No visible canal hematoma. Disc levels: Mild-to-moderate narrowing of the intervertebral disc spaces at C3-C4, C5-C6 and C6-C7 on the basis of degenerative changes. Upper chest: Negative. Other: None. IMPRESSION: No fracture or dislocation. Mild-to-moderate cervical spondylosis with discogenic degenerative changes. Electronically Signed   By: Frazier Richards M.D.   On: 05/21/2022 10:57   CT MAXILLOFACIAL WO CONTRAST  Result Date: 05/20/2022 CLINICAL DATA:  Provided history: Neuro deficit, acute,  stroke suspected. Level 2 trauma, fall on blood thinners. EXAM: CT MAXILLOFACIAL WITHOUT CONTRAST TECHNIQUE: Multidetector CT imaging of the maxillofacial structures was performed. Multiplanar CT image reconstructions were also generated. RADIATION DOSE REDUCTION: This exam was performed according to the departmental dose-optimization program which includes automated exposure control, adjustment of the mA and/or kV according to patient size and/or use of iterative reconstruction technique. COMPARISON:  Concurrently performed non-contrast head CT and CTA head/neck. Head CT 12/25/2021. FINDINGS: Osseous: No evidence of acute maxillofacial fracture. Orbits: No acute orbital finding. Sinuses: Mild mucosal thickening within the frontoethmoidal recesses and anterior ethmoid air cells, bilaterally. Trace vessel thickening within the bilateral sphenoid and maxillary sinuses. Soft tissues: Forehead and right facial soft tissue swelling/hematoma. Limited intracranial: Reported on concurrently performed CTA head/neck. IMPRESSION: 1. No evidence of acute maxillofacial fracture. 2. Forehead and right facial soft tissue swelling/hematoma. 3. Mild paranasal sinus disease, as described. Electronically Signed   By: Kellie Simmering D.O.   On: 06/14/2022 10:54   DG Pelvis Portable  Result Date: 05/31/2022 CLINICAL DATA:  Fall EXAM: PORTABLE PELVIS 1-2 VIEWS COMPARISON:  None Available. FINDINGS: No acute fracture or pelvic ring diastasis. Both hips appear located. Rectal stool distention to 10 cm. Postoperative right groin and pelvis. IMPRESSION: 1. No acute finding. 2. Stool distended rectum. Electronically Signed   By: Jorje Guild M.D.   On: 05/21/2022 10:24   DG Chest Port 1 View  Result Date: 06/09/2022 CLINICAL DATA:  Found down by caregiver EXAM: PORTABLE CHEST 1 VIEW COMPARISON:  05/31/2021 FINDINGS: Cardiomegaly and vascular pedicle widening. Scarring at the left lung base with lateral costophrenic sulcus blunting.  Widening of the vascular pedicle and left apical pleural based thickening. No  definite edema. No pneumothorax. These results were called by telephone at the time of interpretation on 05/21/2022 at 10:23 am to provider [facility] , who verbally acknowledged these results. IMPRESSION: 1. Vascular pedicle widening and left apical pleural based thickening could be from supine positioning and pleural fluid but recommend chest CT to exclude acute aortic process. 2. Chronic cardiomegaly and left pleuroparenchymal scarring. Electronically Signed   By: Jorje Guild M.D.   On: 06/16/2022 10:23    Review of Systems  Unable to perform ROS: Mental status change    Blood pressure (!) 160/91, pulse 89, temperature 97.6 F (36.4 C), temperature source Temporal, resp. rate (!) 24, height '6\' 4"'$  (1.93 m), weight 77 kg, SpO2 100 %. Physical Exam Constitutional:      Appearance: He is ill-appearing.  HENT:     Head:     Comments: Large hematoma central and right upper scalp, there is no overlying abrasion with injuries there, other hematomas left temporal and further back on the crown of his head without bleeding from those locations    Ears:     Comments: Contusion over the right ear    Mouth/Throat:     Mouth: Mucous membranes are dry.  Eyes:     Comments: Pupils 2 mm  Neck:     Comments: Collar Cardiovascular:     Rate and Rhythm: Rhythm irregular.     Heart sounds: Normal heart sounds.  Pulmonary:     Effort: Pulmonary effort is normal.     Breath sounds: No wheezing.     Comments: Few rhonchi Abdominal:     General: Abdomen is flat.     Palpations: Abdomen is soft.     Tenderness: There is no abdominal tenderness. There is no guarding or rebound.  Musculoskeletal:     Comments: Abrasions and skin tears left hand  Skin:    Capillary Refill: Capillary refill takes more than 3 seconds.  Neurological:     Comments: GCS E3 V1 M5=9  Minimal movement right upper and right lower  extremity Spontaneous movement left upper and lower extremity Nonverbal Does not clearly follow commands       Assessment/Plan Fall TBI/large left occipital intracerebral contusion with hemorrhage extending into the temporal and parietal lobes, associated subarachnoid hemorrhage, significant mass effect with 1.5 cm of midline shift and central herniation - Dr. Ellene Route has been consulted, Kcentra and vitamin K given, see [place] discussion below C spine cleared CT CAP results P Multiple medical problems  I had a goals of care discussion at the bedside with his daughter who is very well versed in his medical history.  I reviewed the results of CT studies so far including dismal prognosis in light of his traumatic brain injury.  She relates that his wishes would be to be DNR and focus on comfort in this situation.  We will plan admission for comfort care, palliative care has been consulted.  Critical care 35mn  BZenovia Jarred MD 06/12/2022, 11:06 AM

## 2022-05-22 NOTE — ED Triage Notes (Signed)
Pt BIB [facility] from home for eval after being found down by caregiver. Pt was found on floor in puddle of blood. Pt w/ hx of dementia but normally A&Ox3, able to interact and care for self. Pt anticoagulated on coumadin, arrives to ED GCS11, but decreasing during assessment in trauma bay. PT covered in blood, scattered lacs and abrasions all actively bleeding.

## 2022-05-23 DIAGNOSIS — Z515 Encounter for palliative care: Secondary | ICD-10-CM

## 2022-05-23 MED ORDER — SCOPOLAMINE 1 MG/3DAYS TD PT72
1.0000 | MEDICATED_PATCH | TRANSDERMAL | Status: DC
  Administered 2022-05-23: 1.5 mg via TRANSDERMAL
  Filled 2022-05-23: qty 1

## 2022-05-23 MED ORDER — MORPHINE BOLUS VIA INFUSION: 2.0000 mg | INTRAVENOUS | Status: DC | PRN

## 2022-05-23 MED ORDER — MORPHINE 100MG IN NS 100ML (1MG/ML) PREMIX INFUSION
1.0000 mg/h | INTRAVENOUS | Status: DC
  Administered 2022-05-23: 1 mg/h via INTRAVENOUS
  Administered 2022-05-23: 5 mg/h via INTRAVENOUS
  Filled 2022-05-23: qty 100

## 2022-05-23 MED ORDER — GLYCOPYRROLATE 0.2 MG/ML IJ SOLN
0.4000 mg | INTRAMUSCULAR | Status: DC
  Administered 2022-05-23 – 2022-05-24 (×6): 0.4 mg via INTRAVENOUS
  Filled 2022-05-23 (×7): qty 2

## 2022-05-23 NOTE — Progress Notes (Signed)
This chaplain responded to Dr. Rudene Re consult for EOL spiritual care. The chaplain reviewed the chart notes and was updated by the RN and PMT NP-Michelle.   Family is not at the bedside at the time of the visit. The Pt. does not respond to the chaplain's presence. The RN is at the bedside caring for the Pt. and offers the family will likely return to the bedside. The chaplain will attempt a re-visit.  Chaplain Sallyanne Kuster [phone]

## 2022-05-23 NOTE — Progress Notes (Signed)
Patient ID: [name] [name], male   DOB: 29-Sep-1931, 88 y.o.   MRN: [id number]      Subjective: unresponsive ROS negative except as listed above. Objective: Vital signs in last 24 hours: Temp:  [97.6 F (36.4 C)-98.6 F (37 C)] 98.6 F (37 C) (07/07 0500) Pulse Rate:  [87-113] 91 (07/07 0500) Resp:  [12-38] 17 (07/07 0500) BP: (131-169)/(88-109) 131/88 (07/07 0500) SpO2:  [86 %-100 %] 91 % (07/07 0500) Weight:  [77 kg] 77 kg (07/06 1003)    Intake/Output from previous day: 07/06 0701 - 07/07 0700 In: 69.9 [I.V.:69.9] Out: 0  Intake/Output this shift: No intake/output data recorded.  General appearance: unresponsive Head: large hematoma Nose: frothy nasal discharge Resp: tachypnea, coarse rhonchi Cardio: irregularly irregular rhythm Extremities: L shoiulder contusion, L hand skin tears dressed Neuro: unresponsive  Lab Results: CBC  Recent Labs    06/15/2022 1002 06/02/2022 1022 06/07/2022 1052  WBC 9.9  --   --   HGB 13.8 14.6 12.9*  HCT 44.0 43.0 38.0*  PLT 54*  --   --    BMET Recent Labs    06/02/2022 1002 06/08/2022 1022 06/14/2022 1052  NA 144 141 139  K 3.9 3.9 3.4*  CL 105 105  --   CO2 25  --   --   GLUCOSE 192* 191*  --   BUN 24* 25*  --   CREATININE 1.29* 1.10  --   CALCIUM 9.7  --   --    PT/INR Recent Labs    06/07/2022 1002  LABPROT 24.0*  INR 2.2*   ABG Recent Labs    06/14/2022 1052  PHART 7.389  HCO3 26.7    Studies/Results:  Anti-infectives: Anti-infectives (From admission, onward)    None       Assessment/Plan: Fall TBI/large left occipital intracerebral contusion with hemorrhage extending into the temporal and parietal lobes, associated subarachnoid hemorrhage, significant mass effect with 1.5 cm of midline shift and central herniation - Dr. Ellene Route has been consulted, Kcentra and vitamin K given, lethal injury Multiple medical problems  DNR/Palliative/comfort per family wishes. Robinul should help nasal discharge. Morphine  drip. Family not at bedside currently.  LOS: 1 day    Georganna Skeans, MD, MPH, FACS Trauma & General Surgery Use [email or web address] to contact on call provider  05/23/2022

## 2022-05-23 NOTE — Consult Note (Signed)
Palliative Medicine Inpatient Consult Note  Consulting Provider: Leafy Kindle  Reason for consult:   [facility] Consult  Reason for Consult? catastrophic brain bleed, comfort care   05/23/2022  HPI:  Per intake H&P -->  88yo M with multiple medical problems as listed below on Coumadin for PAF and valve replacement lives alone but has caregivers that come to the house.  He did not like the management as usual this morning.  They suspected something was wrong and investigated.  He had fallen in the house and may have been down for like an hour.  He was brought in as a level 2 trauma as a fall on blood thinners with altered mental status.  GCS 9 on arrival.  Clinical Assessment/Goals of Care:  *Please note that this is a verbal dictation therefore any spelling or grammatical errors are due to the "[place] [facility]" system interpretation.  I have reviewed medical records including EPIC notes, labs and imaging, received report from bedside RN, assessed the patient.    I met with patient's son Elta Guadeloupe and daughter Manuela Schwartz to further discuss diagnosis prognosis, GOC, EOL wishes, disposition and options.   I introduced Palliative Medicine as specialized medical care for people living with serious illness. It focuses on providing relief from the symptoms and stress of a serious illness. The goal is to improve quality of life for both the patient and the family.  Medical History Review and Understanding:  We reviewed Gavinn's past medical history of depression, hypertension, peripheral neuropathy, osteoarthritis, atrial fibrillation, prostate cancer, and visual impairment.  Social History:  Cotton is from [place] originally.  He relocated to [place] in 1976.  He is a widower as his wife passed away 19 years ago.  He has 1 son and 1 daughter.  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  He is a [place] man describes to no specific denomination.  Functional and Nutritional State:  Prior to hospitalization Keigan was living independently attending to all basic ADLs and instrumental ADLs.  Advance Directives:  A detailed discussion was had today regarding advanced directives.  Patient's son and daughter's primary decision makers.  Code Status:  [place] is an established DNAR/DNI CODE STATUS per his wishes.  Discussion:  We reviewed the circumstances surrounding hospitalization inclusive of a traumatic fall in the home.  Discussed that Tayte had a very poor neurological exam leading patient's son and daughter to decide on comfort oriented measures.  We talked about transition to comfort measures in house and what that would entail inclusive of medications to control pain, dyspnea, agitation, nausea, itching, and hiccups.    We discussed stopping all uneccessary measures such as cardiac monitoring, blood draws, needle sticks, and frequent vital signs.   I shared at this time Jakhi has great burden of secretions for which we identified a plan of glycopyrrolate around-the-clock and a scopolamine patch.  We also reviewed the plan for initiation of a low-dose morphine drip as nursing had been in every hour in an effort to provide symptom relief.  Utilized reflective listening throughout our time together.   Provided  "Gone From My Site" booklet.  Decision MakerAnna Genre Daughter [phone]     Lakeem, Rozo [phone]   [phone]   SUMMARY OF RECOMMENDATIONS   DNAR/DNI  Comfort focused care  Comfort medications per [facility] Will initiate low-dose morphine drip  Appreciate chaplain to offer spiritual support  Palliative care will continue to see Magda Paganini during hospitalization  Anticipate in-hospital death  Code Status/Advance Care Planning: DNAR/DNI  Palliative Prophylaxis:  Aspiration, Bowel Regimen, Delirium  Protocol, Frequent Pain Assessment, Oral Care, Palliative Wound Care, and Turn Reposition  Additional Recommendations (Limitations, Scope, Preferences): Continue current care  Psycho-social/Spiritual:  Desire for further Chaplaincy support: Yes Additional Recommendations: Education on end-of-life process   Prognosis: Limited hours to days  Discharge Planning: Discharge will be [facility].  Vitals:   06/16/2022 2200 05/23/22 0500  BP: (!) 163/101 131/88  Pulse: 99 91  Resp: 19 17  Temp: 97.8 F (36.6 C) 98.6 F (37 C)  SpO2: 92% 91%    Intake/Output Summary (Last 24 hours) at 05/23/2022 2820 Last data filed at 05/23/2022 0600 Gross per 24 hour  Intake 69.92 ml  Output 0 ml  Net 69.92 ml   Last Weight  Most recent update: 05/18/2022 10:03 AM    Weight  77 kg (169 lb 12.1 oz)            Gen: Frail elderly Caucasian male in no acute distress HEENT: Dry mucous membranes CV: Regular rate and irregular rhythm PULM: On 6 L nasal cannula, audible secretions, slightly tachypneic ABD: soft/nontender EXT: Bilateral 2+ pitting edema to the ankles Neuro: Somnolent  PPS: 10%    Billing based on MDM: High  Problems Addressed: One acute or chronic illness or injury that poses a threat to life or bodily function  Amount and/or Complexity of Data: Category 3:Discussion of management or test interpretation with external physician/other qualified health care professional/appropriate source (not separately reported)  Risks: Decision not to resuscitate or to de-escalate care because of poor prognosis ______________________________________________________ [facility] Team Team Cell Phone: [phone] Please utilize secure chat with additional questions, if there is no response within 30 minutes please call the above phone number  Palliative Medicine Team providers are available by phone from 7am to 7pm daily and can be reached through the team  cell phone.  Should this patient require assistance outside of these hours, please call the patient's attending physician.

## 2022-05-23 NOTE — Plan of Care (Signed)

## 2022-05-24 DIAGNOSIS — Z515 Encounter for palliative care: Secondary | ICD-10-CM | POA: Diagnosis not present

## 2022-05-24 DIAGNOSIS — Z7189 Other specified counseling: Secondary | ICD-10-CM | POA: Diagnosis not present

## 2022-06-10 ENCOUNTER — Encounter (HOSPITAL_COMMUNITY): Payer: Medicare Other

## 2022-06-17 NOTE — Plan of Care (Signed)

## 2022-06-17 NOTE — Progress Notes (Addendum)
Palliative Medicine Inpatient Follow Up Note   HPI: 88yo M with multiple medical problems as listed below on Coumadin for PAF and valve replacement lives alone but has caregivers that come to the house.  He did not like the management as usual this morning.  They suspected something was wrong and investigated.  He had fallen in the house and may have been down for like an hour.  He was brought in as a level 2 trauma as a fall on blood thinners with altered mental status.  GCS 9 on arrival.  Palliative care was asked to get involved to further help with symptom management at end-of-life.  Today's Discussion 08-Jun-2022  *Please note that this is a verbal dictation therefore any spelling or grammatical errors are due to the "[place] [facility]" system interpretation.  Chart reviewed inclusive of vital signs, progress notes, laboratory results, and diagnostic images.   Per chart review Ketan has remained on 5 mg an hour of his morphine drip.  When discussed with nursing this morning they share that the hardest symptom to control has been his ongoing secretion burden.  Discussed different ways which we can address that inclusive of adding as needed glycopyrrolate in between his already scheduled doses.  Vedanth appears to be more tachypneic this morning at bedside therefore a bolus of 2 mg of morphine was provided.  There is no family at bedside though anticipate Faustino's prognosis is quite limited in terms of hours left of life to live given his present clinical state.  Objective Assessment: Vital Signs Vitals:   2022-06-08 0443 06/08/2022 0641  BP: (!) 85/63   Pulse: (!) 116   Resp: 12   Temp: (!) 102.8 F (39.3 C) 97.8 F (36.6 C)  SpO2: (!) 85%     Intake/Output Summary (Last 24 hours) at 2022/06/08 1610 Last data filed at 05/23/2022 2023 Gross per 24 hour  Intake --  Output 100 ml  Net -100 ml   Last Weight  Most recent update: 06/03/2022 10:03 AM    Weight  77 kg (169 lb 12.1 oz)             Gen: Frail elderly Caucasian male in no acute distress HEENT: Dry mucous membranes CV: Regular rate and irregular rhythm PULM: On 6 L nasal cannula, audible secretions, slightly tachypneic ABD: soft/nontender EXT: Bilateral 2+ pitting edema to the ankles, distal digits cool and cyanotic Neuro: Somnolent  SUMMARY OF RECOMMENDATIONS   DNAR/DNI   Comfort focused care   Comfort medications per MAR --> continue low-dose morphine drip for symptom control  Glycopyrrolate as needed in addition to around-the-clock for better secretion management   Appreciate chaplain to offer spiritual support   Palliative care will continue to see Magda Paganini during hospitalization   Anticipate in-hospital death  Billing based on MDM: High  Problems Addressed: One acute or chronic illness or injury that poses a threat to life or bodily function  Amount and/or Complexity of Data: Category 3:Discussion of management or test interpretation with external physician/other qualified health care professional/appropriate source (not separately reported)  Risks: Parenteral controlled substances ______________________________________________________________________________________ [facility] Team Team Cell Phone: [phone] Please utilize secure chat with additional questions, if there is no response within 30 minutes please call the above phone number  Palliative Medicine Team providers are available by phone from 7am to 7pm daily and can be reached through the team cell phone.  Should this patient require assistance outside of these hours, please call  the patient's attending physician.     

## 2022-06-17 NOTE — Death Summary Note (Signed)
DEATH SUMMARY   Patient Details  Name: [name] [name] MRN: [id number] DOB: 1931/08/24  Admission/Discharge Information   Admit Date:  June 17, 2022  Date of Death: Date of Death: Jun 19, 2022  Time of Death: Time of Death: 0815  Length of Stay: 2  Referring Physician: No primary care provider on file.   Reason(s) for Hospitalization  Fall  Diagnoses  Preliminary cause of death:  Secondary Diagnoses (including complications and co-morbidities):  Principal Problem:   TBI (traumatic brain injury) [facility])   Brief Hospital Course (including significant findings, care, treatment, and services provided and events leading to death)  [name] [name] is a 88 y.o. year old male who was found after an unwitnessed fall. He is on coumadin for PAF and valve replacement.  He was brought in as a level 2 trauma.  Expedited work-up in the emergency department revealed traumatic brain injury with large left occipital intracerebral contusion with hemorrhage extending into the temporal and parietal lobes.  He also had associated subarachnoid hemorrhage.  There was significant mass effect with 1.5 cm of midline shift and central herniation.  Dr. Ellene Route from neurosurgery was consulted.  His anticoagulation was reversed with Kcentra and vitamin K.  A goals of care discussion was had at the bedside with his daughter who is very well versed in his medical history.  In light of his dismal prognosis, comfort care was selected.  He was admitted and palliative care was consulted.  He passed peacefully.    Pertinent Labs and Studies  Significant Diagnostic Studies CT CHEST ABDOMEN PELVIS W CONTRAST  Result Date: 17-Jun-2022 CLINICAL DATA:  Level 2 trauma, fall, on blood thinners; past history prostate cancer, hypertension, minimally indent melanoma, CHF, atrial fibrillation EXAM: CT CHEST, ABDOMEN, AND PELVIS WITH CONTRAST TECHNIQUE: Multidetector CT imaging of the chest, abdomen and pelvis was performed following the  standard protocol during bolus administration of intravenous contrast. RADIATION DOSE REDUCTION: This exam was performed according to the departmental dose-optimization program which includes automated exposure control, adjustment of the mA and/or kV according to patient size and/or use of iterative reconstruction technique. CONTRAST:  138m OMNIPAQUE IOHEXOL 350 MG/ML SOLN IV. No oral contrast. COMPARISON:  PET-CT 10/23/2021 FINDINGS: CT CHEST FINDINGS Cardiovascular: Atherosclerotic calcifications aorta without aneurysm. Enlargement of cardiac chambers. Post MVR. No pericardial effusion. Mediastinum/Nodes: Esophagus unremarkable. Base of cervical region normal appearance. No thoracic adenopathy. Lungs/Pleura: Dependent atelectasis in both lungs. Small LEFT pleural effusion. Pleural thickening LEFT chest. Calcified pleural plaques bilaterally. No pulmonary infiltrate or pneumothorax. Musculoskeletal: Costal bar anterior RIGHT fourth and fifth ribs. Prior median sternotomy. No acute fractures. CT ABDOMEN PELVIS FINDINGS Hepatobiliary: Minimal periportal edema. Numerous tiny cysts again identified within liver. Additional nonspecific 15 x 10 mm low-attenuation lesion superiorly RIGHT lobe liver image 53. Gallbladder distended with minimal adjacent fluid. No calcified gallstones visualized. Pancreas: Atrophic pancreas without mass Spleen: Normal appearance Adrenals/Urinary Tract: Adrenal glands normal appearance. Mild renal cortical thinning. LEFT renal cyst 2.3 x 2.2 cm image 76 unchanged. 11 mm RIGHT renal cyst unchanged. No additional renal mass or hydronephrosis. No ureteral calcification or dilatation. Mildly thickened bladder wall with small bladder diverticula. Stomach/Bowel: Small hiatal hernia. Remainder of stomach unremarkable. Increased stool in rectosigmoid colon. Diverticulosis of descending and sigmoid colon without evidence of diverticulitis. Normal appendix. Questionable rectal wall thickening  anteriorly. Remaining bowel loops unremarkable. Vascular/Lymphatic: Atherosclerotic calcifications aorta and iliac arteries. Aorta normal caliber. Vascular structures patent. No adenopathy. Upper normal sized RIGHT external iliac lymph node image 120 unchanged. Reproductive: Post prostatectomy  Other: Small BILATERAL inguinal hernias containing fat. No free air or free fluid. Soft tissue mass within mesentery in central upper pelvis 3.3 x 2.1 cm, previously 2.6 x 1.8 cm, likely metastatic disease. Musculoskeletal: Osseous demineralization. Sclerotic focus at LEFT pubic body suspicious for sclerotic metastasis unchanged. Chronic inferior endplate deformity L1 likely large Schmorl's node. IMPRESSION: No acute intrathoracic, intra-abdominal, or intrapelvic injuries identified. Distal colonic diverticulosis without evidence of diverticulitis. Questionable rectal wall thickening anteriorly, recommend correlation with digital rectal exam and proctoscopy. Hepatic and renal cysts with a nonspecific 15 x 10 mm lesion superiorly RIGHT lobe liver, new since prior exam, cannot exclude metastatic disease Small BILATERAL inguinal hernias containing fat. 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 Enlargement of cardiac chambers post MVR. Chronic small LEFT pleural effusion and pleural thickening. Aortic Atherosclerosis (ICD10-I70.0). Electronically Signed   By: Lavonia Dana M.D.   On: 06/10/2022 11:24   CT ANGIO HEAD NECK W WO CM  Result Date: 06/10/2022 CLINICAL DATA:  Fall on anticoagulation EXAM: CT ANGIOGRAPHY HEAD AND NECK TECHNIQUE: Multidetector CT imaging of the head and neck was performed using the standard protocol during bolus administration of intravenous contrast. Multiplanar CT image reconstructions and MIPs were obtained to evaluate the vascular anatomy. Carotid stenosis measurements (when applicable) are obtained utilizing NASCET criteria, using the distal internal carotid  diameter as the denominator. RADIATION DOSE REDUCTION: This exam was performed according to the departmental dose-optimization program which includes automated exposure control, adjustment of the mA and/or kV according to patient size and/or use of iterative reconstruction technique. CONTRAST:  174m OMNIPAQUE IOHEXOL 350 MG/ML SOLN COMPARISON:  Head CT 12/25/2021 FINDINGS: CT HEAD Brain: Large parenchymal hemorrhage involving most of the left occipital lobe with extension into temporal and parietal lobes. There is adjacent sulcal subarachnoid hemorrhage. Intraventricular extension is present. There is regional mass effect as well as subfalcine herniation with rightward midline shift and central herniation with partial effacement of basilar cisterns. Trapping of the right lateral ventricle. Midline shift measures approximately 1.5 cm. Gray-white differentiation is preserved. Vascular: Better evaluated on CTA portion. Skull: Calvarium is unremarkable. Sinuses/Orbits: No acute finding. Other: Large right scalp hematoma. Maxillofacial findings dictated separately. Review of the MIP images confirms the above findings CTA NECK Aortic arch: Mild calcified plaque. Great vessel origins are patent. Right carotid system: Patent. Mild calcified plaque at the ICA origin. No stenosis. Left carotid system: Patent. Calcified plaque at the bifurcation and proximal ICA with less than 50% stenosis. Vertebral arteries: Patent. Left vertebral arises directly from the arch. Vessels are codominant. No stenosis. Skeleton: Cervical spine and face dictated separately. Other neck: Unremarkable. Upper chest: Chest dictated separately. Review of the MIP images confirms the above findings CTA HEAD Suboptimal arterial evaluation due to venous enhancement. Anterior circulation: Intracranial internal carotid arteries are patent with calcified plaque but no significant stenosis. Anterior and middle cerebral arteries are patent. There is a 2 mm  rounded area of enhancement within the parenchymal hemorrhage (series 12, image 109). Posterior circulation: Intracranial vertebral arteries, basilar artery, and posterior cerebral arteries are patent. Venous sinuses: Patent as allowed by contrast bolus timing. Review of the MIP images confirms the above findings IMPRESSION: Large parenchymal hemorrhage in the left occipital lobe extending into temporal and parietal lobes. Adjacent sulcal subarachnoid hemorrhage. Significant mass effect is present including subfalcine herniation with 1.5 cm of midline shift as well as central herniation with partial effacement of basal cisterns. There is trapping of the right lateral ventricle. No evidence of  arterial injury in the neck. A 2 mm focus of enhancement within the parenchymal hemorrhage could reflect a pseudoaneurysm or incidental conspicuous vessel. These results were called by telephone at the time of interpretation on 05/29/2022 at 10:58 am to provider [facility] , who verbally acknowledged these results. Electronically Signed   By: Macy Mis M.D.   On: 06/16/2022 11:00   CT C-SPINE NO CHARGE  Result Date: 05/21/2022 CLINICAL DATA:  Polytrauma, blunt neck pain EXAM: CT CERVICAL SPINE WITHOUT CONTRAST TECHNIQUE: Multidetector CT imaging of the cervical spine was performed without intravenous contrast. Multiplanar CT image reconstructions were also generated. RADIATION DOSE REDUCTION: This exam was performed according to the departmental dose-optimization program which includes automated exposure control, adjustment of the mA and/or kV according to patient size and/or use of iterative reconstruction technique. COMPARISON:  Correlation is made with MRI of the cervical spine dated July 06, 2016 FINDINGS: Alignment: Normal. Skull base and vertebrae: No acute fracture. No primary bone lesion or focal pathologic process. Mild-to-moderate spondylosis and discogenic degenerative changes with prominent marginal  osteophytes. Soft tissues and spinal canal: No prevertebral fluid or swelling. No visible canal hematoma. Disc levels: Mild-to-moderate narrowing of the intervertebral disc spaces at C3-C4, C5-C6 and C6-C7 on the basis of degenerative changes. Upper chest: Negative. Other: None. IMPRESSION: No fracture or dislocation. Mild-to-moderate cervical spondylosis with discogenic degenerative changes. Electronically Signed   By: Frazier Richards M.D.   On: 06/12/2022 10:57   CT MAXILLOFACIAL WO CONTRAST  Result Date: 06/05/2022 CLINICAL DATA:  Provided history: Neuro deficit, acute, stroke suspected. Level 2 trauma, fall on blood thinners. EXAM: CT MAXILLOFACIAL WITHOUT CONTRAST TECHNIQUE: Multidetector CT imaging of the maxillofacial structures was performed. Multiplanar CT image reconstructions were also generated. RADIATION DOSE REDUCTION: This exam was performed according to the departmental dose-optimization program which includes automated exposure control, adjustment of the mA and/or kV according to patient size and/or use of iterative reconstruction technique. COMPARISON:  Concurrently performed non-contrast head CT and CTA head/neck. Head CT 12/25/2021. FINDINGS: Osseous: No evidence of acute maxillofacial fracture. Orbits: No acute orbital finding. Sinuses: Mild mucosal thickening within the frontoethmoidal recesses and anterior ethmoid air cells, bilaterally. Trace vessel thickening within the bilateral sphenoid and maxillary sinuses. Soft tissues: Forehead and right facial soft tissue swelling/hematoma. Limited intracranial: Reported on concurrently performed CTA head/neck. IMPRESSION: 1. No evidence of acute maxillofacial fracture. 2. Forehead and right facial soft tissue swelling/hematoma. 3. Mild paranasal sinus disease, as described. Electronically Signed   By: Kellie Simmering D.O.   On: 05/25/2022 10:54   DG Pelvis Portable  Result Date: 06/10/2022 CLINICAL DATA:  Fall EXAM: PORTABLE PELVIS 1-2 VIEWS  COMPARISON:  None Available. FINDINGS: No acute fracture or pelvic ring diastasis. Both hips appear located. Rectal stool distention to 10 cm. Postoperative right groin and pelvis. IMPRESSION: 1. No acute finding. 2. Stool distended rectum. Electronically Signed   By: Jorje Guild M.D.   On: 06/10/2022 10:24   DG Chest Port 1 View  Result Date: 06/13/2022 CLINICAL DATA:  Found down by caregiver EXAM: PORTABLE CHEST 1 VIEW COMPARISON:  05/31/2021 FINDINGS: Cardiomegaly and vascular pedicle widening. Scarring at the left lung base with lateral costophrenic sulcus blunting. Widening of the vascular pedicle and left apical pleural based thickening. No definite edema. No pneumothorax. These results were called by telephone at the time of interpretation on 06/12/2022 at 10:23 am to provider [facility] , who verbally acknowledged these results. IMPRESSION: 1. Vascular pedicle widening and left apical pleural based  thickening could be from supine positioning and pleural fluid but recommend chest CT to exclude acute aortic process. 2. Chronic cardiomegaly and left pleuroparenchymal scarring. Electronically Signed   By: Jorje Guild M.D.   On: 06/09/2022 10:23    Microbiology No results found for this or any previous visit (from the past 240 hour(s)).  Lab Basic Metabolic Panel: No results for input(s): "NA", "K", "CL", "CO2", "GLUCOSE", "BUN", "CREATININE", "CALCIUM", "MG", "PHOS" in the last 168 hours. Liver Function Tests: No results for input(s): "AST", "ALT", "ALKPHOS", "BILITOT", "PROT", "ALBUMIN" in the last 168 hours. No results for input(s): "LIPASE", "AMYLASE" in the last 168 hours. No results for input(s): "AMMONIA" in the last 168 hours. CBC: No results for input(s): "WBC", "NEUTROABS", "HGB", "HCT", "MCV", "PLT" in the last 168 hours. Cardiac Enzymes: No results for input(s): "CKTOTAL", "CKMB", "CKMBINDEX", "TROPONINI" in the last 168 hours. Sepsis Labs: No results for input(s):  "PROCALCITON", "WBC", "LATICACIDVEN" in the last 168 hours.  Procedures/Operations  0   Zenovia Jarred 06/04/2022, 6:44 AM

## 2022-06-17 NOTE — Progress Notes (Signed)
Patient noted with no respirations, no pulse, has been unresponsive, a DNR comfort care only; daughter Anna Genre and Dr. Jacinto Reap. [place] Silos was also notified, [place] also notified, pt. Is an ME case and also notified.

## 2022-06-17 DEATH — deceased

## 2022-06-23 ENCOUNTER — Encounter (INDEPENDENT_AMBULATORY_CARE_PROVIDER_SITE_OTHER): Payer: Medicare Other | Admitting: Ophthalmology

## 2022-07-11 ENCOUNTER — Ambulatory Visit: Payer: Medicare Other | Admitting: Interventional Cardiology

## 2022-09-02 ENCOUNTER — Ambulatory Visit: Payer: Medicare Other | Admitting: Family Medicine

## 2022-09-04 ENCOUNTER — Ambulatory Visit: Payer: Medicare Other | Admitting: Family Medicine

## 2022-09-25 ENCOUNTER — Other Ambulatory Visit: Payer: Medicare Other

## 2022-09-25 ENCOUNTER — Ambulatory Visit: Payer: Medicare Other | Admitting: Hematology & Oncology
